# Patient Record
Sex: Male | Born: 1940
Health system: Southern US, Community
[De-identification: ages and names within clinical notes are randomized; demographics above are authoritative.]

## PROBLEM LIST (undated history)

## (undated) DIAGNOSIS — G809 Cerebral palsy, unspecified: Secondary | ICD-10-CM

## (undated) DIAGNOSIS — M6250 Muscle wasting and atrophy, not elsewhere classified, unspecified site: Secondary | ICD-10-CM

## (undated) DIAGNOSIS — Z993 Dependence on wheelchair: Secondary | ICD-10-CM

## (undated) DIAGNOSIS — H5010 Unspecified exotropia: Secondary | ICD-10-CM

## (undated) DIAGNOSIS — Z87442 Personal history of urinary calculi: Secondary | ICD-10-CM

## (undated) DIAGNOSIS — F419 Anxiety disorder, unspecified: Secondary | ICD-10-CM

## (undated) DIAGNOSIS — D72819 Decreased white blood cell count, unspecified: Secondary | ICD-10-CM

## (undated) DIAGNOSIS — R3915 Urgency of urination: Secondary | ICD-10-CM

## (undated) DIAGNOSIS — N200 Calculus of kidney: Secondary | ICD-10-CM

## (undated) DIAGNOSIS — R32 Unspecified urinary incontinence: Secondary | ICD-10-CM

## (undated) DIAGNOSIS — E559 Vitamin D deficiency, unspecified: Secondary | ICD-10-CM

## (undated) DIAGNOSIS — R600 Localized edema: Secondary | ICD-10-CM

## (undated) DIAGNOSIS — N390 Urinary tract infection, site not specified: Secondary | ICD-10-CM

## (undated) DIAGNOSIS — I872 Venous insufficiency (chronic) (peripheral): Secondary | ICD-10-CM

## (undated) DIAGNOSIS — R350 Frequency of micturition: Secondary | ICD-10-CM

## (undated) DIAGNOSIS — R399 Unspecified symptoms and signs involving the genitourinary system: Secondary | ICD-10-CM

## (undated) DIAGNOSIS — E785 Hyperlipidemia, unspecified: Secondary | ICD-10-CM

## (undated) DIAGNOSIS — F79 Unspecified intellectual disabilities: Secondary | ICD-10-CM

## (undated) DIAGNOSIS — L309 Dermatitis, unspecified: Secondary | ICD-10-CM

## (undated) DIAGNOSIS — N400 Enlarged prostate without lower urinary tract symptoms: Secondary | ICD-10-CM

## (undated) DIAGNOSIS — E663 Overweight: Secondary | ICD-10-CM

## (undated) DIAGNOSIS — R972 Elevated prostate specific antigen [PSA]: Secondary | ICD-10-CM

## (undated) DIAGNOSIS — I890 Lymphedema, not elsewhere classified: Secondary | ICD-10-CM

## (undated) DIAGNOSIS — C61 Malignant neoplasm of prostate: Secondary | ICD-10-CM

## (undated) DIAGNOSIS — R310 Gross hematuria: Secondary | ICD-10-CM

## (undated) HISTORY — DX: Dermatitis, unspecified: L30.9

## (undated) HISTORY — DX: Unspecified intellectual disabilities: F79

## (undated) HISTORY — DX: Cerebral palsy, unspecified: G80.9

## (undated) HISTORY — DX: Urgency of urination: R39.15

## (undated) HISTORY — PX: OTHER SURGICAL HISTORY: SHX169

## (undated) HISTORY — DX: Dependence on wheelchair: Z99.3

## (undated) HISTORY — DX: Malignant neoplasm of prostate: C61

## (undated) HISTORY — DX: Elevated prostate specific antigen (PSA): R97.20

## (undated) HISTORY — DX: Unspecified exotropia: H50.10

## (undated) HISTORY — DX: Benign prostatic hyperplasia without lower urinary tract symptoms: N40.0

## (undated) HISTORY — DX: Hyperlipidemia, unspecified: E78.5

## (undated) HISTORY — DX: Unspecified symptoms and signs involving the genitourinary system: R39.9

## (undated) HISTORY — DX: Overweight: E66.3

## (undated) HISTORY — DX: Vitamin D deficiency, unspecified: E55.9

## (undated) HISTORY — DX: Gross hematuria: R31.0

## (undated) HISTORY — DX: Muscle wasting and atrophy, not elsewhere classified, unspecified site: M62.50

## (undated) HISTORY — DX: Unspecified urinary incontinence: R32

## (undated) HISTORY — DX: Venous insufficiency (chronic) (peripheral): I87.2

## (undated) HISTORY — DX: Decreased white blood cell count, unspecified: D72.819

## (undated) HISTORY — DX: Lymphedema, not elsewhere classified: I89.0

## (undated) HISTORY — DX: Frequency of micturition: R35.0

---

## 2008-11-10 HISTORY — PX: PROSTATE SURGERY: SHX751

## 2011-05-02 DIAGNOSIS — J029 Acute pharyngitis, unspecified: Secondary | ICD-10-CM | POA: Diagnosis not present

## 2011-05-02 DIAGNOSIS — R0982 Postnasal drip: Secondary | ICD-10-CM | POA: Diagnosis not present

## 2011-08-10 DIAGNOSIS — E785 Hyperlipidemia, unspecified: Secondary | ICD-10-CM | POA: Diagnosis not present

## 2011-08-10 DIAGNOSIS — Z993 Dependence on wheelchair: Secondary | ICD-10-CM | POA: Diagnosis not present

## 2011-08-10 DIAGNOSIS — I10 Essential (primary) hypertension: Secondary | ICD-10-CM | POA: Diagnosis not present

## 2011-08-10 DIAGNOSIS — F71 Moderate intellectual disabilities: Secondary | ICD-10-CM | POA: Diagnosis not present

## 2011-08-14 DIAGNOSIS — E785 Hyperlipidemia, unspecified: Secondary | ICD-10-CM | POA: Diagnosis not present

## 2011-08-14 DIAGNOSIS — E559 Vitamin D deficiency, unspecified: Secondary | ICD-10-CM | POA: Diagnosis not present

## 2011-08-14 DIAGNOSIS — I10 Essential (primary) hypertension: Secondary | ICD-10-CM | POA: Diagnosis not present

## 2011-09-18 DIAGNOSIS — S1093XA Contusion of unspecified part of neck, initial encounter: Secondary | ICD-10-CM | POA: Diagnosis not present

## 2011-09-18 DIAGNOSIS — S0003XA Contusion of scalp, initial encounter: Secondary | ICD-10-CM | POA: Diagnosis not present

## 2011-10-09 DIAGNOSIS — C61 Malignant neoplasm of prostate: Secondary | ICD-10-CM | POA: Diagnosis not present

## 2011-12-20 DIAGNOSIS — F70 Mild intellectual disabilities: Secondary | ICD-10-CM | POA: Diagnosis not present

## 2012-02-02 DIAGNOSIS — Z9181 History of falling: Secondary | ICD-10-CM | POA: Diagnosis not present

## 2012-02-02 DIAGNOSIS — E669 Obesity, unspecified: Secondary | ICD-10-CM | POA: Diagnosis not present

## 2012-02-02 DIAGNOSIS — Z0000 Encounter for general adult medical examination without abnormal findings: Secondary | ICD-10-CM | POA: Diagnosis not present

## 2012-02-02 DIAGNOSIS — Z1331 Encounter for screening for depression: Secondary | ICD-10-CM | POA: Diagnosis not present

## 2012-03-06 DIAGNOSIS — Z23 Encounter for immunization: Secondary | ICD-10-CM | POA: Diagnosis not present

## 2012-03-25 DIAGNOSIS — W06XXXA Fall from bed, initial encounter: Secondary | ICD-10-CM | POA: Diagnosis not present

## 2012-03-25 DIAGNOSIS — Z9181 History of falling: Secondary | ICD-10-CM | POA: Diagnosis not present

## 2012-04-08 DIAGNOSIS — C61 Malignant neoplasm of prostate: Secondary | ICD-10-CM | POA: Diagnosis not present

## 2012-04-08 DIAGNOSIS — R350 Frequency of micturition: Secondary | ICD-10-CM | POA: Diagnosis not present

## 2012-08-19 DIAGNOSIS — Z993 Dependence on wheelchair: Secondary | ICD-10-CM | POA: Diagnosis not present

## 2012-08-19 DIAGNOSIS — I10 Essential (primary) hypertension: Secondary | ICD-10-CM | POA: Diagnosis not present

## 2012-08-19 DIAGNOSIS — E785 Hyperlipidemia, unspecified: Secondary | ICD-10-CM | POA: Diagnosis not present

## 2012-08-19 DIAGNOSIS — R634 Abnormal weight loss: Secondary | ICD-10-CM | POA: Diagnosis not present

## 2012-10-25 ENCOUNTER — Emergency Department: Payer: Self-pay | Admitting: Emergency Medicine

## 2012-10-25 DIAGNOSIS — Z043 Encounter for examination and observation following other accident: Secondary | ICD-10-CM | POA: Diagnosis not present

## 2012-10-25 DIAGNOSIS — I4949 Other premature depolarization: Secondary | ICD-10-CM | POA: Diagnosis not present

## 2012-10-25 DIAGNOSIS — R0602 Shortness of breath: Secondary | ICD-10-CM | POA: Diagnosis not present

## 2012-11-19 DIAGNOSIS — R634 Abnormal weight loss: Secondary | ICD-10-CM | POA: Diagnosis not present

## 2012-11-19 DIAGNOSIS — I10 Essential (primary) hypertension: Secondary | ICD-10-CM | POA: Diagnosis not present

## 2012-11-19 DIAGNOSIS — E785 Hyperlipidemia, unspecified: Secondary | ICD-10-CM | POA: Diagnosis not present

## 2012-11-19 DIAGNOSIS — F71 Moderate intellectual disabilities: Secondary | ICD-10-CM | POA: Diagnosis not present

## 2012-12-05 DIAGNOSIS — H2510 Age-related nuclear cataract, unspecified eye: Secondary | ICD-10-CM | POA: Diagnosis not present

## 2012-12-11 DIAGNOSIS — S20229A Contusion of unspecified back wall of thorax, initial encounter: Secondary | ICD-10-CM | POA: Diagnosis not present

## 2013-01-09 DIAGNOSIS — R05 Cough: Secondary | ICD-10-CM | POA: Diagnosis not present

## 2013-01-09 DIAGNOSIS — J069 Acute upper respiratory infection, unspecified: Secondary | ICD-10-CM | POA: Diagnosis not present

## 2013-01-09 DIAGNOSIS — R51 Headache: Secondary | ICD-10-CM | POA: Diagnosis not present

## 2013-02-04 DIAGNOSIS — Z0000 Encounter for general adult medical examination without abnormal findings: Secondary | ICD-10-CM | POA: Diagnosis not present

## 2013-02-04 DIAGNOSIS — Z9181 History of falling: Secondary | ICD-10-CM | POA: Diagnosis not present

## 2013-02-04 DIAGNOSIS — Z1331 Encounter for screening for depression: Secondary | ICD-10-CM | POA: Diagnosis not present

## 2013-02-04 DIAGNOSIS — Z23 Encounter for immunization: Secondary | ICD-10-CM | POA: Diagnosis not present

## 2013-05-02 DIAGNOSIS — C61 Malignant neoplasm of prostate: Secondary | ICD-10-CM | POA: Diagnosis not present

## 2013-05-25 ENCOUNTER — Emergency Department: Payer: Self-pay

## 2013-05-25 DIAGNOSIS — Z7982 Long term (current) use of aspirin: Secondary | ICD-10-CM | POA: Diagnosis not present

## 2013-05-25 DIAGNOSIS — R319 Hematuria, unspecified: Secondary | ICD-10-CM | POA: Diagnosis not present

## 2013-05-25 DIAGNOSIS — I10 Essential (primary) hypertension: Secondary | ICD-10-CM | POA: Diagnosis not present

## 2013-05-25 LAB — URINALYSIS, COMPLETE
BILIRUBIN, UR: NEGATIVE
Bacteria: NONE SEEN
Glucose,UR: NEGATIVE mg/dL (ref 0–75)
Ketone: NEGATIVE
Leukocyte Esterase: NEGATIVE
Nitrite: NEGATIVE
PH: 8 (ref 4.5–8.0)
Specific Gravity: 1.012 (ref 1.003–1.030)
Squamous Epithelial: NONE SEEN
WBC UR: 1 /HPF (ref 0–5)

## 2013-05-25 LAB — CBC
HCT: 40 % (ref 40.0–52.0)
HGB: 14.1 g/dL (ref 13.0–18.0)
MCH: 33.7 pg (ref 26.0–34.0)
MCHC: 35.3 g/dL (ref 32.0–36.0)
MCV: 95 fL (ref 80–100)
Platelet: 175 10*3/uL (ref 150–440)
RBC: 4.19 10*6/uL — ABNORMAL LOW (ref 4.40–5.90)
RDW: 12.3 % (ref 11.5–14.5)
WBC: 5.8 10*3/uL (ref 3.8–10.6)

## 2013-05-25 LAB — BASIC METABOLIC PANEL
Anion Gap: 1 — ABNORMAL LOW (ref 7–16)
BUN: 17 mg/dL (ref 7–18)
CO2: 31 mmol/L (ref 21–32)
CREATININE: 0.92 mg/dL (ref 0.60–1.30)
Calcium, Total: 9.3 mg/dL (ref 8.5–10.1)
Chloride: 108 mmol/L — ABNORMAL HIGH (ref 98–107)
EGFR (Non-African Amer.): >60
GLUCOSE: 109 mg/dL — AB (ref 65–99)
Osmolality: 282 (ref 275–301)
POTASSIUM: 4 mmol/L (ref 3.5–5.1)
Sodium: 140 mmol/L (ref 136–145)

## 2013-05-27 LAB — URINE CULTURE

## 2013-06-06 DIAGNOSIS — R310 Gross hematuria: Secondary | ICD-10-CM | POA: Diagnosis not present

## 2013-06-09 DIAGNOSIS — I10 Essential (primary) hypertension: Secondary | ICD-10-CM | POA: Diagnosis not present

## 2013-06-09 DIAGNOSIS — G809 Cerebral palsy, unspecified: Secondary | ICD-10-CM | POA: Diagnosis not present

## 2013-06-09 DIAGNOSIS — E785 Hyperlipidemia, unspecified: Secondary | ICD-10-CM | POA: Diagnosis not present

## 2013-06-09 DIAGNOSIS — C61 Malignant neoplasm of prostate: Secondary | ICD-10-CM | POA: Diagnosis not present

## 2013-06-23 DIAGNOSIS — E785 Hyperlipidemia, unspecified: Secondary | ICD-10-CM | POA: Diagnosis not present

## 2013-06-23 DIAGNOSIS — I10 Essential (primary) hypertension: Secondary | ICD-10-CM | POA: Diagnosis not present

## 2013-06-23 LAB — LIPID PANEL
CHOLESTEROL: 130 mg/dL (ref 0–200)
HDL: 50 mg/dL (ref 35–70)
LDL Cholesterol: 66 mg/dL
Triglycerides: 72 mg/dL (ref 40–160)

## 2013-06-24 ENCOUNTER — Ambulatory Visit: Payer: Self-pay | Admitting: Urology

## 2013-06-24 DIAGNOSIS — N200 Calculus of kidney: Secondary | ICD-10-CM | POA: Diagnosis not present

## 2013-06-24 DIAGNOSIS — R319 Hematuria, unspecified: Secondary | ICD-10-CM | POA: Diagnosis not present

## 2013-06-24 DIAGNOSIS — Q619 Cystic kidney disease, unspecified: Secondary | ICD-10-CM | POA: Diagnosis not present

## 2013-07-01 DIAGNOSIS — R310 Gross hematuria: Secondary | ICD-10-CM | POA: Diagnosis not present

## 2013-08-07 DIAGNOSIS — Z01818 Encounter for other preprocedural examination: Secondary | ICD-10-CM | POA: Diagnosis not present

## 2013-08-07 DIAGNOSIS — R310 Gross hematuria: Secondary | ICD-10-CM | POA: Diagnosis not present

## 2013-08-14 ENCOUNTER — Ambulatory Visit: Payer: Self-pay | Admitting: Urology

## 2013-08-14 DIAGNOSIS — G809 Cerebral palsy, unspecified: Secondary | ICD-10-CM | POA: Diagnosis not present

## 2013-08-14 DIAGNOSIS — Z01812 Encounter for preprocedural laboratory examination: Secondary | ICD-10-CM | POA: Diagnosis not present

## 2013-08-14 DIAGNOSIS — Z01818 Encounter for other preprocedural examination: Secondary | ICD-10-CM | POA: Diagnosis not present

## 2013-08-14 DIAGNOSIS — R310 Gross hematuria: Secondary | ICD-10-CM | POA: Diagnosis not present

## 2013-08-14 DIAGNOSIS — Z01810 Encounter for preprocedural cardiovascular examination: Secondary | ICD-10-CM | POA: Diagnosis not present

## 2013-08-14 DIAGNOSIS — I10 Essential (primary) hypertension: Secondary | ICD-10-CM | POA: Diagnosis not present

## 2013-08-14 LAB — CBC
HCT: 40.3 % (ref 40.0–52.0)
HGB: 14.1 g/dL (ref 13.0–18.0)
MCH: 33.6 pg (ref 26.0–34.0)
MCHC: 35.1 g/dL (ref 32.0–36.0)
MCV: 96 fL (ref 80–100)
Platelet: 143 10*3/uL — ABNORMAL LOW (ref 150–440)
RBC: 4.21 10*6/uL — ABNORMAL LOW (ref 4.40–5.90)
RDW: 12.9 % (ref 11.5–14.5)
WBC: 5.5 10*3/uL (ref 3.8–10.6)

## 2013-08-14 LAB — BASIC METABOLIC PANEL
ANION GAP: 3 — AB (ref 7–16)
BUN: 14 mg/dL (ref 7–18)
CREATININE: 0.71 mg/dL (ref 0.60–1.30)
Calcium, Total: 8.4 mg/dL — ABNORMAL LOW (ref 8.5–10.1)
Chloride: 108 mmol/L — ABNORMAL HIGH (ref 98–107)
Co2: 28 mmol/L (ref 21–32)
EGFR (African American): >60
Glucose: 99 mg/dL (ref 65–99)
OSMOLALITY: 278 (ref 275–301)
Potassium: 3.8 mmol/L (ref 3.5–5.1)
Sodium: 139 mmol/L (ref 136–145)

## 2013-08-22 ENCOUNTER — Ambulatory Visit: Payer: Self-pay | Admitting: Urology

## 2013-08-22 DIAGNOSIS — Z7982 Long term (current) use of aspirin: Secondary | ICD-10-CM | POA: Diagnosis not present

## 2013-08-22 DIAGNOSIS — Z8546 Personal history of malignant neoplasm of prostate: Secondary | ICD-10-CM | POA: Diagnosis not present

## 2013-08-22 DIAGNOSIS — Z791 Long term (current) use of non-steroidal anti-inflammatories (NSAID): Secondary | ICD-10-CM | POA: Diagnosis not present

## 2013-08-22 DIAGNOSIS — Z8249 Family history of ischemic heart disease and other diseases of the circulatory system: Secondary | ICD-10-CM | POA: Diagnosis not present

## 2013-08-22 DIAGNOSIS — I10 Essential (primary) hypertension: Secondary | ICD-10-CM | POA: Diagnosis not present

## 2013-08-22 DIAGNOSIS — R319 Hematuria, unspecified: Secondary | ICD-10-CM | POA: Diagnosis not present

## 2013-08-22 DIAGNOSIS — R310 Gross hematuria: Secondary | ICD-10-CM | POA: Diagnosis not present

## 2013-08-22 DIAGNOSIS — Z993 Dependence on wheelchair: Secondary | ICD-10-CM | POA: Diagnosis not present

## 2013-08-22 DIAGNOSIS — Z79899 Other long term (current) drug therapy: Secondary | ICD-10-CM | POA: Diagnosis not present

## 2013-08-22 DIAGNOSIS — N400 Enlarged prostate without lower urinary tract symptoms: Secondary | ICD-10-CM | POA: Diagnosis not present

## 2013-08-22 DIAGNOSIS — G809 Cerebral palsy, unspecified: Secondary | ICD-10-CM | POA: Diagnosis not present

## 2013-09-25 DIAGNOSIS — M6250 Muscle wasting and atrophy, not elsewhere classified, unspecified site: Secondary | ICD-10-CM | POA: Diagnosis not present

## 2013-09-25 DIAGNOSIS — Z1331 Encounter for screening for depression: Secondary | ICD-10-CM | POA: Diagnosis not present

## 2013-09-25 DIAGNOSIS — R5381 Other malaise: Secondary | ICD-10-CM | POA: Diagnosis not present

## 2013-09-25 DIAGNOSIS — R5383 Other fatigue: Secondary | ICD-10-CM | POA: Diagnosis not present

## 2013-09-25 DIAGNOSIS — L608 Other nail disorders: Secondary | ICD-10-CM | POA: Diagnosis not present

## 2013-10-02 DIAGNOSIS — F71 Moderate intellectual disabilities: Secondary | ICD-10-CM | POA: Diagnosis not present

## 2013-10-02 DIAGNOSIS — G809 Cerebral palsy, unspecified: Secondary | ICD-10-CM | POA: Diagnosis not present

## 2013-10-02 DIAGNOSIS — IMO0001 Reserved for inherently not codable concepts without codable children: Secondary | ICD-10-CM | POA: Diagnosis not present

## 2013-10-02 DIAGNOSIS — I10 Essential (primary) hypertension: Secondary | ICD-10-CM | POA: Diagnosis not present

## 2013-10-02 DIAGNOSIS — M6250 Muscle wasting and atrophy, not elsewhere classified, unspecified site: Secondary | ICD-10-CM | POA: Diagnosis not present

## 2013-10-02 DIAGNOSIS — Z8546 Personal history of malignant neoplasm of prostate: Secondary | ICD-10-CM | POA: Diagnosis not present

## 2013-10-02 DIAGNOSIS — L608 Other nail disorders: Secondary | ICD-10-CM | POA: Diagnosis not present

## 2013-10-02 DIAGNOSIS — Z7982 Long term (current) use of aspirin: Secondary | ICD-10-CM | POA: Diagnosis not present

## 2013-10-02 DIAGNOSIS — Z8612 Personal history of poliomyelitis: Secondary | ICD-10-CM | POA: Diagnosis not present

## 2013-10-02 DIAGNOSIS — Z993 Dependence on wheelchair: Secondary | ICD-10-CM | POA: Diagnosis not present

## 2013-10-02 DIAGNOSIS — Z9181 History of falling: Secondary | ICD-10-CM | POA: Diagnosis not present

## 2013-10-06 DIAGNOSIS — M6250 Muscle wasting and atrophy, not elsewhere classified, unspecified site: Secondary | ICD-10-CM | POA: Diagnosis not present

## 2013-10-06 DIAGNOSIS — G809 Cerebral palsy, unspecified: Secondary | ICD-10-CM | POA: Diagnosis not present

## 2013-10-06 DIAGNOSIS — IMO0001 Reserved for inherently not codable concepts without codable children: Secondary | ICD-10-CM | POA: Diagnosis not present

## 2013-10-06 DIAGNOSIS — I10 Essential (primary) hypertension: Secondary | ICD-10-CM | POA: Diagnosis not present

## 2013-10-06 DIAGNOSIS — L608 Other nail disorders: Secondary | ICD-10-CM | POA: Diagnosis not present

## 2013-10-06 DIAGNOSIS — F71 Moderate intellectual disabilities: Secondary | ICD-10-CM | POA: Diagnosis not present

## 2013-10-08 DIAGNOSIS — I10 Essential (primary) hypertension: Secondary | ICD-10-CM | POA: Diagnosis not present

## 2013-10-08 DIAGNOSIS — G809 Cerebral palsy, unspecified: Secondary | ICD-10-CM | POA: Diagnosis not present

## 2013-10-08 DIAGNOSIS — L608 Other nail disorders: Secondary | ICD-10-CM | POA: Diagnosis not present

## 2013-10-08 DIAGNOSIS — M6250 Muscle wasting and atrophy, not elsewhere classified, unspecified site: Secondary | ICD-10-CM | POA: Diagnosis not present

## 2013-10-08 DIAGNOSIS — IMO0001 Reserved for inherently not codable concepts without codable children: Secondary | ICD-10-CM | POA: Diagnosis not present

## 2013-10-08 DIAGNOSIS — F71 Moderate intellectual disabilities: Secondary | ICD-10-CM | POA: Diagnosis not present

## 2013-10-09 DIAGNOSIS — M6250 Muscle wasting and atrophy, not elsewhere classified, unspecified site: Secondary | ICD-10-CM | POA: Diagnosis not present

## 2013-10-09 DIAGNOSIS — Z993 Dependence on wheelchair: Secondary | ICD-10-CM | POA: Diagnosis not present

## 2013-10-09 DIAGNOSIS — I10 Essential (primary) hypertension: Secondary | ICD-10-CM | POA: Diagnosis not present

## 2013-10-09 DIAGNOSIS — E785 Hyperlipidemia, unspecified: Secondary | ICD-10-CM | POA: Diagnosis not present

## 2013-10-17 DIAGNOSIS — M6250 Muscle wasting and atrophy, not elsewhere classified, unspecified site: Secondary | ICD-10-CM | POA: Diagnosis not present

## 2013-10-17 DIAGNOSIS — G809 Cerebral palsy, unspecified: Secondary | ICD-10-CM | POA: Diagnosis not present

## 2013-10-17 DIAGNOSIS — I10 Essential (primary) hypertension: Secondary | ICD-10-CM | POA: Diagnosis not present

## 2013-10-17 DIAGNOSIS — F71 Moderate intellectual disabilities: Secondary | ICD-10-CM | POA: Diagnosis not present

## 2013-10-17 DIAGNOSIS — IMO0001 Reserved for inherently not codable concepts without codable children: Secondary | ICD-10-CM | POA: Diagnosis not present

## 2013-10-17 DIAGNOSIS — L608 Other nail disorders: Secondary | ICD-10-CM | POA: Diagnosis not present

## 2013-10-21 DIAGNOSIS — IMO0001 Reserved for inherently not codable concepts without codable children: Secondary | ICD-10-CM | POA: Diagnosis not present

## 2013-10-21 DIAGNOSIS — F71 Moderate intellectual disabilities: Secondary | ICD-10-CM | POA: Diagnosis not present

## 2013-10-21 DIAGNOSIS — M6250 Muscle wasting and atrophy, not elsewhere classified, unspecified site: Secondary | ICD-10-CM | POA: Diagnosis not present

## 2013-10-21 DIAGNOSIS — G809 Cerebral palsy, unspecified: Secondary | ICD-10-CM | POA: Diagnosis not present

## 2013-10-21 DIAGNOSIS — I10 Essential (primary) hypertension: Secondary | ICD-10-CM | POA: Diagnosis not present

## 2013-10-21 DIAGNOSIS — L608 Other nail disorders: Secondary | ICD-10-CM | POA: Diagnosis not present

## 2013-10-30 DIAGNOSIS — C61 Malignant neoplasm of prostate: Secondary | ICD-10-CM | POA: Diagnosis not present

## 2013-12-03 DIAGNOSIS — W06XXXA Fall from bed, initial encounter: Secondary | ICD-10-CM | POA: Diagnosis not present

## 2013-12-03 DIAGNOSIS — E785 Hyperlipidemia, unspecified: Secondary | ICD-10-CM | POA: Diagnosis not present

## 2013-12-03 DIAGNOSIS — Z993 Dependence on wheelchair: Secondary | ICD-10-CM | POA: Diagnosis not present

## 2013-12-03 DIAGNOSIS — Z9181 History of falling: Secondary | ICD-10-CM | POA: Diagnosis not present

## 2013-12-06 DIAGNOSIS — M169 Osteoarthritis of hip, unspecified: Secondary | ICD-10-CM | POA: Diagnosis not present

## 2013-12-06 DIAGNOSIS — G809 Cerebral palsy, unspecified: Secondary | ICD-10-CM | POA: Diagnosis not present

## 2013-12-06 DIAGNOSIS — M899 Disorder of bone, unspecified: Secondary | ICD-10-CM | POA: Diagnosis not present

## 2013-12-06 DIAGNOSIS — R262 Difficulty in walking, not elsewhere classified: Secondary | ICD-10-CM | POA: Diagnosis not present

## 2013-12-06 DIAGNOSIS — M6281 Muscle weakness (generalized): Secondary | ICD-10-CM | POA: Diagnosis not present

## 2013-12-06 DIAGNOSIS — M949 Disorder of cartilage, unspecified: Secondary | ICD-10-CM | POA: Diagnosis not present

## 2013-12-06 DIAGNOSIS — M1610 Unilateral primary osteoarthritis, unspecified hip: Secondary | ICD-10-CM | POA: Diagnosis not present

## 2013-12-06 DIAGNOSIS — M25559 Pain in unspecified hip: Secondary | ICD-10-CM | POA: Diagnosis not present

## 2013-12-08 DIAGNOSIS — G809 Cerebral palsy, unspecified: Secondary | ICD-10-CM | POA: Diagnosis not present

## 2013-12-08 DIAGNOSIS — M6281 Muscle weakness (generalized): Secondary | ICD-10-CM | POA: Diagnosis not present

## 2013-12-08 DIAGNOSIS — R262 Difficulty in walking, not elsewhere classified: Secondary | ICD-10-CM | POA: Diagnosis not present

## 2013-12-15 DIAGNOSIS — R262 Difficulty in walking, not elsewhere classified: Secondary | ICD-10-CM | POA: Diagnosis not present

## 2013-12-15 DIAGNOSIS — G809 Cerebral palsy, unspecified: Secondary | ICD-10-CM | POA: Diagnosis not present

## 2013-12-15 DIAGNOSIS — M6281 Muscle weakness (generalized): Secondary | ICD-10-CM | POA: Diagnosis not present

## 2013-12-20 DIAGNOSIS — R262 Difficulty in walking, not elsewhere classified: Secondary | ICD-10-CM | POA: Diagnosis not present

## 2013-12-20 DIAGNOSIS — M6281 Muscle weakness (generalized): Secondary | ICD-10-CM | POA: Diagnosis not present

## 2013-12-20 DIAGNOSIS — G809 Cerebral palsy, unspecified: Secondary | ICD-10-CM | POA: Diagnosis not present

## 2013-12-22 DIAGNOSIS — R262 Difficulty in walking, not elsewhere classified: Secondary | ICD-10-CM | POA: Diagnosis not present

## 2013-12-22 DIAGNOSIS — M6281 Muscle weakness (generalized): Secondary | ICD-10-CM | POA: Diagnosis not present

## 2013-12-22 DIAGNOSIS — H2510 Age-related nuclear cataract, unspecified eye: Secondary | ICD-10-CM | POA: Diagnosis not present

## 2013-12-22 DIAGNOSIS — G809 Cerebral palsy, unspecified: Secondary | ICD-10-CM | POA: Diagnosis not present

## 2013-12-26 DIAGNOSIS — R262 Difficulty in walking, not elsewhere classified: Secondary | ICD-10-CM | POA: Diagnosis not present

## 2013-12-26 DIAGNOSIS — G809 Cerebral palsy, unspecified: Secondary | ICD-10-CM | POA: Diagnosis not present

## 2013-12-26 DIAGNOSIS — M6281 Muscle weakness (generalized): Secondary | ICD-10-CM | POA: Diagnosis not present

## 2013-12-29 DIAGNOSIS — G809 Cerebral palsy, unspecified: Secondary | ICD-10-CM | POA: Diagnosis not present

## 2013-12-29 DIAGNOSIS — M6281 Muscle weakness (generalized): Secondary | ICD-10-CM | POA: Diagnosis not present

## 2013-12-29 DIAGNOSIS — R262 Difficulty in walking, not elsewhere classified: Secondary | ICD-10-CM | POA: Diagnosis not present

## 2013-12-31 DIAGNOSIS — L608 Other nail disorders: Secondary | ICD-10-CM | POA: Diagnosis not present

## 2013-12-31 DIAGNOSIS — Z23 Encounter for immunization: Secondary | ICD-10-CM | POA: Diagnosis not present

## 2013-12-31 DIAGNOSIS — R609 Edema, unspecified: Secondary | ICD-10-CM | POA: Diagnosis not present

## 2014-01-05 DIAGNOSIS — M6281 Muscle weakness (generalized): Secondary | ICD-10-CM | POA: Diagnosis not present

## 2014-01-05 DIAGNOSIS — G809 Cerebral palsy, unspecified: Secondary | ICD-10-CM | POA: Diagnosis not present

## 2014-01-05 DIAGNOSIS — R262 Difficulty in walking, not elsewhere classified: Secondary | ICD-10-CM | POA: Diagnosis not present

## 2014-01-10 DIAGNOSIS — G809 Cerebral palsy, unspecified: Secondary | ICD-10-CM | POA: Diagnosis not present

## 2014-01-10 DIAGNOSIS — M6281 Muscle weakness (generalized): Secondary | ICD-10-CM | POA: Diagnosis not present

## 2014-01-10 DIAGNOSIS — R262 Difficulty in walking, not elsewhere classified: Secondary | ICD-10-CM | POA: Diagnosis not present

## 2014-01-12 DIAGNOSIS — R262 Difficulty in walking, not elsewhere classified: Secondary | ICD-10-CM | POA: Diagnosis not present

## 2014-01-12 DIAGNOSIS — M6281 Muscle weakness (generalized): Secondary | ICD-10-CM | POA: Diagnosis not present

## 2014-01-12 DIAGNOSIS — G809 Cerebral palsy, unspecified: Secondary | ICD-10-CM | POA: Diagnosis not present

## 2014-01-17 DIAGNOSIS — M6281 Muscle weakness (generalized): Secondary | ICD-10-CM | POA: Diagnosis not present

## 2014-01-17 DIAGNOSIS — R262 Difficulty in walking, not elsewhere classified: Secondary | ICD-10-CM | POA: Diagnosis not present

## 2014-01-17 DIAGNOSIS — G809 Cerebral palsy, unspecified: Secondary | ICD-10-CM | POA: Diagnosis not present

## 2014-01-19 DIAGNOSIS — G809 Cerebral palsy, unspecified: Secondary | ICD-10-CM | POA: Diagnosis not present

## 2014-01-19 DIAGNOSIS — R262 Difficulty in walking, not elsewhere classified: Secondary | ICD-10-CM | POA: Diagnosis not present

## 2014-01-19 DIAGNOSIS — M6281 Muscle weakness (generalized): Secondary | ICD-10-CM | POA: Diagnosis not present

## 2014-01-22 DIAGNOSIS — I872 Venous insufficiency (chronic) (peripheral): Secondary | ICD-10-CM | POA: Diagnosis not present

## 2014-01-22 DIAGNOSIS — I890 Lymphedema, not elsewhere classified: Secondary | ICD-10-CM | POA: Diagnosis not present

## 2014-01-22 DIAGNOSIS — M7989 Other specified soft tissue disorders: Secondary | ICD-10-CM | POA: Diagnosis not present

## 2014-01-26 DIAGNOSIS — M6281 Muscle weakness (generalized): Secondary | ICD-10-CM | POA: Diagnosis not present

## 2014-01-26 DIAGNOSIS — R262 Difficulty in walking, not elsewhere classified: Secondary | ICD-10-CM | POA: Diagnosis not present

## 2014-01-26 DIAGNOSIS — G809 Cerebral palsy, unspecified: Secondary | ICD-10-CM | POA: Diagnosis not present

## 2014-02-02 DIAGNOSIS — G809 Cerebral palsy, unspecified: Secondary | ICD-10-CM | POA: Diagnosis not present

## 2014-02-02 DIAGNOSIS — M6281 Muscle weakness (generalized): Secondary | ICD-10-CM | POA: Diagnosis not present

## 2014-02-02 DIAGNOSIS — R262 Difficulty in walking, not elsewhere classified: Secondary | ICD-10-CM | POA: Diagnosis not present

## 2014-02-09 DIAGNOSIS — Z1389 Encounter for screening for other disorder: Secondary | ICD-10-CM | POA: Diagnosis not present

## 2014-02-09 DIAGNOSIS — Z0000 Encounter for general adult medical examination without abnormal findings: Secondary | ICD-10-CM | POA: Diagnosis not present

## 2014-02-09 DIAGNOSIS — M6281 Muscle weakness (generalized): Secondary | ICD-10-CM | POA: Diagnosis not present

## 2014-02-09 DIAGNOSIS — R3981 Functional urinary incontinence: Secondary | ICD-10-CM | POA: Diagnosis not present

## 2014-02-09 DIAGNOSIS — R262 Difficulty in walking, not elsewhere classified: Secondary | ICD-10-CM | POA: Diagnosis not present

## 2014-02-09 DIAGNOSIS — Z125 Encounter for screening for malignant neoplasm of prostate: Secondary | ICD-10-CM | POA: Diagnosis not present

## 2014-02-09 DIAGNOSIS — G809 Cerebral palsy, unspecified: Secondary | ICD-10-CM | POA: Diagnosis not present

## 2014-02-09 DIAGNOSIS — F71 Moderate intellectual disabilities: Secondary | ICD-10-CM | POA: Diagnosis not present

## 2014-02-09 DIAGNOSIS — Z9181 History of falling: Secondary | ICD-10-CM | POA: Diagnosis not present

## 2014-02-12 DIAGNOSIS — R262 Difficulty in walking, not elsewhere classified: Secondary | ICD-10-CM | POA: Diagnosis not present

## 2014-02-12 DIAGNOSIS — G809 Cerebral palsy, unspecified: Secondary | ICD-10-CM | POA: Diagnosis not present

## 2014-02-12 DIAGNOSIS — M6281 Muscle weakness (generalized): Secondary | ICD-10-CM | POA: Diagnosis not present

## 2014-02-16 DIAGNOSIS — G809 Cerebral palsy, unspecified: Secondary | ICD-10-CM | POA: Diagnosis not present

## 2014-02-16 DIAGNOSIS — M6281 Muscle weakness (generalized): Secondary | ICD-10-CM | POA: Diagnosis not present

## 2014-02-16 DIAGNOSIS — R262 Difficulty in walking, not elsewhere classified: Secondary | ICD-10-CM | POA: Diagnosis not present

## 2014-02-28 DIAGNOSIS — M6281 Muscle weakness (generalized): Secondary | ICD-10-CM | POA: Diagnosis not present

## 2014-02-28 DIAGNOSIS — G809 Cerebral palsy, unspecified: Secondary | ICD-10-CM | POA: Diagnosis not present

## 2014-02-28 DIAGNOSIS — R262 Difficulty in walking, not elsewhere classified: Secondary | ICD-10-CM | POA: Diagnosis not present

## 2014-03-02 DIAGNOSIS — G809 Cerebral palsy, unspecified: Secondary | ICD-10-CM | POA: Diagnosis not present

## 2014-03-02 DIAGNOSIS — R262 Difficulty in walking, not elsewhere classified: Secondary | ICD-10-CM | POA: Diagnosis not present

## 2014-03-02 DIAGNOSIS — M6281 Muscle weakness (generalized): Secondary | ICD-10-CM | POA: Diagnosis not present

## 2014-03-09 DIAGNOSIS — G809 Cerebral palsy, unspecified: Secondary | ICD-10-CM | POA: Diagnosis not present

## 2014-03-09 DIAGNOSIS — R262 Difficulty in walking, not elsewhere classified: Secondary | ICD-10-CM | POA: Diagnosis not present

## 2014-03-09 DIAGNOSIS — M6281 Muscle weakness (generalized): Secondary | ICD-10-CM | POA: Diagnosis not present

## 2014-03-16 DIAGNOSIS — G809 Cerebral palsy, unspecified: Secondary | ICD-10-CM | POA: Diagnosis not present

## 2014-03-16 DIAGNOSIS — M6281 Muscle weakness (generalized): Secondary | ICD-10-CM | POA: Diagnosis not present

## 2014-03-16 DIAGNOSIS — R262 Difficulty in walking, not elsewhere classified: Secondary | ICD-10-CM | POA: Diagnosis not present

## 2014-03-24 DIAGNOSIS — R262 Difficulty in walking, not elsewhere classified: Secondary | ICD-10-CM | POA: Diagnosis not present

## 2014-03-24 DIAGNOSIS — G809 Cerebral palsy, unspecified: Secondary | ICD-10-CM | POA: Diagnosis not present

## 2014-03-24 DIAGNOSIS — M6281 Muscle weakness (generalized): Secondary | ICD-10-CM | POA: Diagnosis not present

## 2014-04-07 DIAGNOSIS — G809 Cerebral palsy, unspecified: Secondary | ICD-10-CM | POA: Diagnosis not present

## 2014-04-07 DIAGNOSIS — R262 Difficulty in walking, not elsewhere classified: Secondary | ICD-10-CM | POA: Diagnosis not present

## 2014-04-07 DIAGNOSIS — M6281 Muscle weakness (generalized): Secondary | ICD-10-CM | POA: Diagnosis not present

## 2014-04-14 DIAGNOSIS — M6281 Muscle weakness (generalized): Secondary | ICD-10-CM | POA: Diagnosis not present

## 2014-04-14 DIAGNOSIS — R262 Difficulty in walking, not elsewhere classified: Secondary | ICD-10-CM | POA: Diagnosis not present

## 2014-04-14 DIAGNOSIS — G809 Cerebral palsy, unspecified: Secondary | ICD-10-CM | POA: Diagnosis not present

## 2014-04-21 DIAGNOSIS — M6281 Muscle weakness (generalized): Secondary | ICD-10-CM | POA: Diagnosis not present

## 2014-04-21 DIAGNOSIS — R262 Difficulty in walking, not elsewhere classified: Secondary | ICD-10-CM | POA: Diagnosis not present

## 2014-04-21 DIAGNOSIS — G809 Cerebral palsy, unspecified: Secondary | ICD-10-CM | POA: Diagnosis not present

## 2014-04-27 DIAGNOSIS — M79609 Pain in unspecified limb: Secondary | ICD-10-CM | POA: Diagnosis not present

## 2014-04-27 DIAGNOSIS — I890 Lymphedema, not elsewhere classified: Secondary | ICD-10-CM | POA: Diagnosis not present

## 2014-04-27 DIAGNOSIS — M1990 Unspecified osteoarthritis, unspecified site: Secondary | ICD-10-CM | POA: Diagnosis not present

## 2014-04-27 DIAGNOSIS — M7989 Other specified soft tissue disorders: Secondary | ICD-10-CM | POA: Diagnosis not present

## 2014-04-27 DIAGNOSIS — I872 Venous insufficiency (chronic) (peripheral): Secondary | ICD-10-CM | POA: Diagnosis not present

## 2014-04-28 DIAGNOSIS — R262 Difficulty in walking, not elsewhere classified: Secondary | ICD-10-CM | POA: Diagnosis not present

## 2014-04-28 DIAGNOSIS — G809 Cerebral palsy, unspecified: Secondary | ICD-10-CM | POA: Diagnosis not present

## 2014-04-28 DIAGNOSIS — M6281 Muscle weakness (generalized): Secondary | ICD-10-CM | POA: Diagnosis not present

## 2014-05-05 DIAGNOSIS — R262 Difficulty in walking, not elsewhere classified: Secondary | ICD-10-CM | POA: Diagnosis not present

## 2014-05-05 DIAGNOSIS — G809 Cerebral palsy, unspecified: Secondary | ICD-10-CM | POA: Diagnosis not present

## 2014-05-05 DIAGNOSIS — M6281 Muscle weakness (generalized): Secondary | ICD-10-CM | POA: Diagnosis not present

## 2014-05-08 DIAGNOSIS — R350 Frequency of micturition: Secondary | ICD-10-CM | POA: Diagnosis not present

## 2014-05-08 DIAGNOSIS — E663 Overweight: Secondary | ICD-10-CM | POA: Diagnosis not present

## 2014-05-08 DIAGNOSIS — C61 Malignant neoplasm of prostate: Secondary | ICD-10-CM | POA: Diagnosis not present

## 2014-05-12 DIAGNOSIS — G809 Cerebral palsy, unspecified: Secondary | ICD-10-CM | POA: Diagnosis not present

## 2014-05-12 DIAGNOSIS — R262 Difficulty in walking, not elsewhere classified: Secondary | ICD-10-CM | POA: Diagnosis not present

## 2014-05-12 DIAGNOSIS — M6281 Muscle weakness (generalized): Secondary | ICD-10-CM | POA: Diagnosis not present

## 2014-05-18 DIAGNOSIS — G809 Cerebral palsy, unspecified: Secondary | ICD-10-CM | POA: Diagnosis not present

## 2014-05-18 DIAGNOSIS — R262 Difficulty in walking, not elsewhere classified: Secondary | ICD-10-CM | POA: Diagnosis not present

## 2014-05-18 DIAGNOSIS — M6281 Muscle weakness (generalized): Secondary | ICD-10-CM | POA: Diagnosis not present

## 2014-05-25 DIAGNOSIS — G809 Cerebral palsy, unspecified: Secondary | ICD-10-CM | POA: Diagnosis not present

## 2014-05-25 DIAGNOSIS — R262 Difficulty in walking, not elsewhere classified: Secondary | ICD-10-CM | POA: Diagnosis not present

## 2014-05-25 DIAGNOSIS — M6281 Muscle weakness (generalized): Secondary | ICD-10-CM | POA: Diagnosis not present

## 2014-06-01 DIAGNOSIS — R262 Difficulty in walking, not elsewhere classified: Secondary | ICD-10-CM | POA: Diagnosis not present

## 2014-06-01 DIAGNOSIS — M6281 Muscle weakness (generalized): Secondary | ICD-10-CM | POA: Diagnosis not present

## 2014-06-01 DIAGNOSIS — G809 Cerebral palsy, unspecified: Secondary | ICD-10-CM | POA: Diagnosis not present

## 2014-06-08 DIAGNOSIS — R262 Difficulty in walking, not elsewhere classified: Secondary | ICD-10-CM | POA: Diagnosis not present

## 2014-06-08 DIAGNOSIS — G809 Cerebral palsy, unspecified: Secondary | ICD-10-CM | POA: Diagnosis not present

## 2014-06-08 DIAGNOSIS — M6281 Muscle weakness (generalized): Secondary | ICD-10-CM | POA: Diagnosis not present

## 2014-06-15 DIAGNOSIS — M6281 Muscle weakness (generalized): Secondary | ICD-10-CM | POA: Diagnosis not present

## 2014-06-15 DIAGNOSIS — G809 Cerebral palsy, unspecified: Secondary | ICD-10-CM | POA: Diagnosis not present

## 2014-06-15 DIAGNOSIS — R262 Difficulty in walking, not elsewhere classified: Secondary | ICD-10-CM | POA: Diagnosis not present

## 2014-06-22 DIAGNOSIS — G809 Cerebral palsy, unspecified: Secondary | ICD-10-CM | POA: Diagnosis not present

## 2014-06-22 DIAGNOSIS — R634 Abnormal weight loss: Secondary | ICD-10-CM | POA: Diagnosis not present

## 2014-06-22 DIAGNOSIS — Z23 Encounter for immunization: Secondary | ICD-10-CM | POA: Diagnosis not present

## 2014-06-22 DIAGNOSIS — M62569 Muscle wasting and atrophy, not elsewhere classified, unspecified lower leg: Secondary | ICD-10-CM | POA: Diagnosis not present

## 2014-06-22 DIAGNOSIS — R262 Difficulty in walking, not elsewhere classified: Secondary | ICD-10-CM | POA: Diagnosis not present

## 2014-06-22 DIAGNOSIS — Z8612 Personal history of poliomyelitis: Secondary | ICD-10-CM | POA: Diagnosis not present

## 2014-06-22 DIAGNOSIS — E785 Hyperlipidemia, unspecified: Secondary | ICD-10-CM | POA: Diagnosis not present

## 2014-06-22 DIAGNOSIS — M6281 Muscle weakness (generalized): Secondary | ICD-10-CM | POA: Diagnosis not present

## 2014-06-22 DIAGNOSIS — R600 Localized edema: Secondary | ICD-10-CM | POA: Diagnosis not present

## 2014-06-22 DIAGNOSIS — Z993 Dependence on wheelchair: Secondary | ICD-10-CM | POA: Diagnosis not present

## 2014-07-08 DIAGNOSIS — G809 Cerebral palsy, unspecified: Secondary | ICD-10-CM | POA: Diagnosis not present

## 2014-07-08 DIAGNOSIS — M6281 Muscle weakness (generalized): Secondary | ICD-10-CM | POA: Diagnosis not present

## 2014-07-08 DIAGNOSIS — R262 Difficulty in walking, not elsewhere classified: Secondary | ICD-10-CM | POA: Diagnosis not present

## 2014-07-13 DIAGNOSIS — R262 Difficulty in walking, not elsewhere classified: Secondary | ICD-10-CM | POA: Diagnosis not present

## 2014-07-13 DIAGNOSIS — M6281 Muscle weakness (generalized): Secondary | ICD-10-CM | POA: Diagnosis not present

## 2014-07-13 DIAGNOSIS — G809 Cerebral palsy, unspecified: Secondary | ICD-10-CM | POA: Diagnosis not present

## 2014-07-20 DIAGNOSIS — M6281 Muscle weakness (generalized): Secondary | ICD-10-CM | POA: Diagnosis not present

## 2014-07-20 DIAGNOSIS — R262 Difficulty in walking, not elsewhere classified: Secondary | ICD-10-CM | POA: Diagnosis not present

## 2014-07-20 DIAGNOSIS — G809 Cerebral palsy, unspecified: Secondary | ICD-10-CM | POA: Diagnosis not present

## 2014-07-27 DIAGNOSIS — R262 Difficulty in walking, not elsewhere classified: Secondary | ICD-10-CM | POA: Diagnosis not present

## 2014-07-27 DIAGNOSIS — G809 Cerebral palsy, unspecified: Secondary | ICD-10-CM | POA: Diagnosis not present

## 2014-07-27 DIAGNOSIS — M6281 Muscle weakness (generalized): Secondary | ICD-10-CM | POA: Diagnosis not present

## 2014-08-03 DIAGNOSIS — R262 Difficulty in walking, not elsewhere classified: Secondary | ICD-10-CM | POA: Diagnosis not present

## 2014-08-03 DIAGNOSIS — M6281 Muscle weakness (generalized): Secondary | ICD-10-CM | POA: Diagnosis not present

## 2014-08-03 DIAGNOSIS — G809 Cerebral palsy, unspecified: Secondary | ICD-10-CM | POA: Diagnosis not present

## 2014-08-10 DIAGNOSIS — M6281 Muscle weakness (generalized): Secondary | ICD-10-CM | POA: Diagnosis not present

## 2014-08-10 DIAGNOSIS — R262 Difficulty in walking, not elsewhere classified: Secondary | ICD-10-CM | POA: Diagnosis not present

## 2014-08-10 DIAGNOSIS — G809 Cerebral palsy, unspecified: Secondary | ICD-10-CM | POA: Diagnosis not present

## 2014-08-15 NOTE — Op Note (Signed)
PATIENT NAME:  [name], [name] MR#:  [id number] DATE OF BIRTH:  12/07/1940  DATE OF PROCEDURE:  08/22/2013  PREOPERATIVE DIAGNOSIS:  Hematuria.   POSTOPERATIVE DIAGNOSIS:  Hematuria of unknown origin.   PROCEDURE:  Cystoscopy.   ANESTHESIA:  General.   PATIENT POSITION:  Supine lithotomy.   PROCEDURE: Appropriate timeout, witnessed and acknowledged by 3 different people. We begin the procedure. Cystoscopy is done after proper sterile prepping and draping.  The rigid 57 French sheath with a 4 oblique lens is utilized to view the urethra and bladder. The urethra is normal throughout its length with no evidence of stricture.  Prostatic urethra is short with a slight middle lobe hypertrophy but otherwise normal nonobstructive prostate.  Bladder wall is  appropriately thickened.  This patient has cerebral palsy and he has a very thickened bladder wall.  It is not an outlet obstruction wall.  There is 1 saccule in the bladder wall on the left lateral posterior side.  It does not have any tumors, masses or growth within the saccule.  The ureter is in a normal position with a clear efflux of urine coming from each stadium-like orifice.  The interureteric ridge is normal. The dome of the bladder and lateral walls of the bladder are normal but the bladder is a little bit thickened secondary to his disease process.  There are no tumors, masses or growths within the bladder.  No calculi within the bladder.  At the end of the procedure, the bladder is emptied and 30 mL of 0.25% plain Marcaine are placed in the bladder.  The patient is sent to recovery in satisfactory condition with an B and O suppository.  He will be discharged, seen in followup in 3 weeks.    ____________________________ Janice Coffin. Elnoria Howard, DO rdh:ja D: 08/22/2013 13:55:55 ET T: 08/22/2013 14:48:45 ET JOB#: [id number]  cc: Janice Coffin. Elnoria Howard, DO, <Dictator> RICHARD D HART DO ELECTRONICALLY SIGNED 09/05/2013 14:06

## 2014-08-17 DIAGNOSIS — G809 Cerebral palsy, unspecified: Secondary | ICD-10-CM | POA: Diagnosis not present

## 2014-08-17 DIAGNOSIS — M6281 Muscle weakness (generalized): Secondary | ICD-10-CM | POA: Diagnosis not present

## 2014-08-17 DIAGNOSIS — R262 Difficulty in walking, not elsewhere classified: Secondary | ICD-10-CM | POA: Diagnosis not present

## 2014-08-24 DIAGNOSIS — R262 Difficulty in walking, not elsewhere classified: Secondary | ICD-10-CM | POA: Diagnosis not present

## 2014-08-24 DIAGNOSIS — G809 Cerebral palsy, unspecified: Secondary | ICD-10-CM | POA: Diagnosis not present

## 2014-08-24 DIAGNOSIS — M6281 Muscle weakness (generalized): Secondary | ICD-10-CM | POA: Diagnosis not present

## 2014-08-31 DIAGNOSIS — R262 Difficulty in walking, not elsewhere classified: Secondary | ICD-10-CM | POA: Diagnosis not present

## 2014-08-31 DIAGNOSIS — M6281 Muscle weakness (generalized): Secondary | ICD-10-CM | POA: Diagnosis not present

## 2014-08-31 DIAGNOSIS — G809 Cerebral palsy, unspecified: Secondary | ICD-10-CM | POA: Diagnosis not present

## 2014-09-08 DIAGNOSIS — M6281 Muscle weakness (generalized): Secondary | ICD-10-CM | POA: Diagnosis not present

## 2014-09-08 DIAGNOSIS — G809 Cerebral palsy, unspecified: Secondary | ICD-10-CM | POA: Diagnosis not present

## 2014-09-08 DIAGNOSIS — R262 Difficulty in walking, not elsewhere classified: Secondary | ICD-10-CM | POA: Diagnosis not present

## 2014-09-14 DIAGNOSIS — M6281 Muscle weakness (generalized): Secondary | ICD-10-CM | POA: Diagnosis not present

## 2014-09-14 DIAGNOSIS — G809 Cerebral palsy, unspecified: Secondary | ICD-10-CM | POA: Diagnosis not present

## 2014-09-14 DIAGNOSIS — R262 Difficulty in walking, not elsewhere classified: Secondary | ICD-10-CM | POA: Diagnosis not present

## 2014-09-21 DIAGNOSIS — R262 Difficulty in walking, not elsewhere classified: Secondary | ICD-10-CM | POA: Diagnosis not present

## 2014-09-21 DIAGNOSIS — G809 Cerebral palsy, unspecified: Secondary | ICD-10-CM | POA: Diagnosis not present

## 2014-09-21 DIAGNOSIS — M6281 Muscle weakness (generalized): Secondary | ICD-10-CM | POA: Diagnosis not present

## 2014-09-28 DIAGNOSIS — G809 Cerebral palsy, unspecified: Secondary | ICD-10-CM | POA: Diagnosis not present

## 2014-09-28 DIAGNOSIS — R262 Difficulty in walking, not elsewhere classified: Secondary | ICD-10-CM | POA: Diagnosis not present

## 2014-09-28 DIAGNOSIS — M6281 Muscle weakness (generalized): Secondary | ICD-10-CM | POA: Diagnosis not present

## 2014-10-05 DIAGNOSIS — G809 Cerebral palsy, unspecified: Secondary | ICD-10-CM | POA: Diagnosis not present

## 2014-10-05 DIAGNOSIS — R262 Difficulty in walking, not elsewhere classified: Secondary | ICD-10-CM | POA: Diagnosis not present

## 2014-10-05 DIAGNOSIS — M6281 Muscle weakness (generalized): Secondary | ICD-10-CM | POA: Diagnosis not present

## 2014-10-12 DIAGNOSIS — M6281 Muscle weakness (generalized): Secondary | ICD-10-CM | POA: Diagnosis not present

## 2014-10-12 DIAGNOSIS — G809 Cerebral palsy, unspecified: Secondary | ICD-10-CM | POA: Diagnosis not present

## 2014-10-12 DIAGNOSIS — R262 Difficulty in walking, not elsewhere classified: Secondary | ICD-10-CM | POA: Diagnosis not present

## 2014-10-19 DIAGNOSIS — R262 Difficulty in walking, not elsewhere classified: Secondary | ICD-10-CM | POA: Diagnosis not present

## 2014-10-19 DIAGNOSIS — M6281 Muscle weakness (generalized): Secondary | ICD-10-CM | POA: Diagnosis not present

## 2014-10-19 DIAGNOSIS — G809 Cerebral palsy, unspecified: Secondary | ICD-10-CM | POA: Diagnosis not present

## 2014-10-27 DIAGNOSIS — G809 Cerebral palsy, unspecified: Secondary | ICD-10-CM | POA: Diagnosis not present

## 2014-10-27 DIAGNOSIS — R262 Difficulty in walking, not elsewhere classified: Secondary | ICD-10-CM | POA: Diagnosis not present

## 2014-10-27 DIAGNOSIS — M6281 Muscle weakness (generalized): Secondary | ICD-10-CM | POA: Diagnosis not present

## 2014-10-29 ENCOUNTER — Encounter: Payer: Self-pay | Admitting: *Deleted

## 2014-10-30 ENCOUNTER — Encounter: Payer: Self-pay | Admitting: Urology

## 2014-10-30 ENCOUNTER — Ambulatory Visit (INDEPENDENT_AMBULATORY_CARE_PROVIDER_SITE_OTHER): Payer: Medicare Other | Admitting: Urology

## 2014-10-30 ENCOUNTER — Ambulatory Visit: Payer: Self-pay | Admitting: Family Medicine

## 2014-10-30 VITALS — BP 138/83 | HR 65

## 2014-10-30 DIAGNOSIS — C61 Malignant neoplasm of prostate: Secondary | ICD-10-CM | POA: Diagnosis not present

## 2014-10-30 DIAGNOSIS — R350 Frequency of micturition: Secondary | ICD-10-CM

## 2014-10-30 LAB — BLADDER SCAN AMB NON-IMAGING: SCAN RESULT: 53

## 2014-10-30 MED ORDER — ALFUZOSIN HCL ER 10 MG PO TB24: 10.0000 mg | ORAL_TABLET | Freq: Every day | ORAL | Status: DC

## 2014-10-30 NOTE — Progress Notes (Signed)
10/30/2014 6:26 PM   [name] 12-Sep-1940 [id number]  Referring provider: No referring provider defined for this encounter.  Chief Complaint  Patient presents with  . Prostate Cancer      month followup  . Urinary Frequency    HPI: [name] is a 74 year old mentally handicapped white male with PCa ( T1 (c), N0 and M0 ) Gleason score 3 + 4 adenocarinoma involving the left side of the prostate of the prostate. Tumor markers include PSA level of 4.1 ng/mL) .  He underwent brachytherapy on 11/10/2008.  His most recent PSA was <0.1 ng/mL on 05/08/2014.    His staff states he is not experiencing any difficulty.  His PVR today is 53 mL.  He is on alfuzosin.  His pants are wet today because he wears overalls and cannot get his pants down quickly enough to use the toilet.    The staff does not complain of gross hematuria, weight loss or appetite loss.     PMH: Past Medical History  Diagnosis Date  . Mental retardation   . Hematuria, gross   . BPH (benign prostatic hyperplasia)   . Lower urinary tract symptoms   . Incontinence   . Elevated PSA   . Over weight   . Urinary frequency   . Prostate cancer   . Urinary urgency     Surgical History: Past Surgical History  Procedure Laterality Date  . Prostate surgery  11/10/2008    BRACHYTHERAPY    Home Medications:    Medication List       This list is accurate as of: 10/30/14  6:26 PM.  Always use your most recent med list.               alfuzosin 10 MG 24 hr tablet  Commonly known as:  UROXATRAL  Take 1 tablet (10 mg total) by mouth daily with breakfast.     aspirin 81 MG tablet  Take 81 mg by mouth daily.     betamethasone dipropionate 0.05 % cream  Commonly known as:  DIPROLENE  Apply topically 2 (two) times daily.     Calcium + D3 600-200 MG-UNIT Tabs  Take by mouth daily.     ibuprofen 800 MG tablet  Commonly known as:  ADVIL,MOTRIN  Take 800 mg by mouth every 8 (eight) hours as needed.     losartan 50 MG tablet  Commonly known as:  COZAAR  Take by mouth.        Allergies: No Known Allergies  Family History: Family History  Problem Relation Age of Onset  . Heart disease      Social History:  reports that he has never smoked. He does not have any smokeless tobacco history on file. He reports that he does not drink alcohol or use illicit drugs.  ROS: Urological Symptom Review  Patient is experiencing the following symptoms: Frequent urination Leakage of urine   Review of Systems  Gastrointestinal (upper)  : Negative for upper GI symptoms  Gastrointestinal (lower) : Negative for lower GI symptoms  Constitutional : Negative for symptoms  Skin: Negative for skin symptoms  Eyes: Negative for eye symptoms  Ear/Nose/Throat : Negative for Ear/Nose/Throat symptoms  Hematologic/Lymphatic: Negative for Hematologic/Lymphatic symptoms  Cardiovascular : Negative for cardiovascular symptoms  Respiratory : Negative for respiratory symptoms  Endocrine: Negative for endocrine symptoms  Musculoskeletal: Negative for musculoskeletal symptoms  Neurological: Negative for neurological symptoms  Psychologic: Negative for psychiatric symptoms   Physical Exam:  BP 138/83 mmHg  Pulse 65   Rectal: Patient with  normal sphincter tone. Perineum without scarring or rashes. No rectal masses are appreciated. Prostate is small and fibrous, No nodules are appreciated. Seminal vesicles are normal.   Laboratory Data: Results for orders placed or performed in visit on 10/30/14  BLADDER SCAN AMB NON-IMAGING  Result Value Ref Range   Scan Result 53    Lab Results  Component Value Date   WBC 5.5 08/14/2013   HGB 14.1 08/14/2013   HCT 40.3 08/14/2013   MCV 96 08/14/2013   PLT 143* 08/14/2013    Lab Results  Component Value Date   CREATININE 0.71 08/14/2013    No results found for: PSA  No results found for: TESTOSTERONE  No results found for:  HGBA1C  Urinalysis No results found for: COLORURINE, APPEARANCEUR, LABSPEC, PHURINE, GLUCOSEU, HGBUR, BILIRUBINUR, KETONESUR, PROTEINUR, UROBILINOGEN, NITRITE, LEUKOCYTESUR  Pertinent Imaging:   Assessment & Plan:    1. Prostate cancer:   Patient was diagnosed with a ( T1 (c), N0 and M0 ) Gleason score 3 + 4 adenocarinoma involving the left side of the prostate of the prostate. Tumor markers include PSA level of 4.1 ng/mL.  Current diagnosis was determined by transrectal ultrasound and needle biopsy of the prostate (completeted on 08/27/2008). Treatment has included brachytherapy (completed on 11/10/2008).  His last PSA was <0.1 ng/mL on 05/08/2014 . He has no complaints. He will follow up in one year for PSA and DRE.    - PSA  2. Urinary frequency:   Patient's staff states his urinary symptoms are well controlled on alfuzosin.  His PVR is 53 mL.  He will continue the alfuzosin.  Refills are sent to his pharmacy.  He will follow up in one year for PVR and symptom recheck.    - BLADDER SCAN AMB NON-IMAGING   Return in about 1 year (around 10/30/2015) for PSA, PVR and DRE.  Zara Council, [facility] [address], [place], [place] [address] [phone]

## 2014-10-31 LAB — PSA

## 2014-11-02 DIAGNOSIS — R262 Difficulty in walking, not elsewhere classified: Secondary | ICD-10-CM | POA: Diagnosis not present

## 2014-11-02 DIAGNOSIS — M6281 Muscle weakness (generalized): Secondary | ICD-10-CM | POA: Diagnosis not present

## 2014-11-02 DIAGNOSIS — G809 Cerebral palsy, unspecified: Secondary | ICD-10-CM | POA: Diagnosis not present

## 2014-11-03 ENCOUNTER — Ambulatory Visit: Payer: Self-pay | Admitting: Urology

## 2014-11-09 ENCOUNTER — Telehealth: Payer: Self-pay

## 2014-11-09 DIAGNOSIS — G809 Cerebral palsy, unspecified: Secondary | ICD-10-CM | POA: Diagnosis not present

## 2014-11-09 DIAGNOSIS — M6281 Muscle weakness (generalized): Secondary | ICD-10-CM | POA: Diagnosis not present

## 2014-11-09 DIAGNOSIS — R262 Difficulty in walking, not elsewhere classified: Secondary | ICD-10-CM | POA: Diagnosis not present

## 2014-11-09 NOTE — Telephone Encounter (Signed)
Pt pharmacy sent a verification order. Do you still want pt taking uroxatral, if so should pt take it 7 a.m. Or p.m.? Please advise.

## 2014-11-09 NOTE — Telephone Encounter (Signed)
Left message with Dolan Amen in reference to PSA.

## 2014-11-09 NOTE — Telephone Encounter (Signed)
-----   Message from Nori Riis, PA-C sent at 11/08/2014  9:33 AM EDT ----- PSA remains undetectable. We will see him in one year.

## 2014-11-09 NOTE — Telephone Encounter (Signed)
He may take it in the morning for the evening which ever is more convenient for him. He just needs to take it 30 minutes after a meal.

## 2014-11-10 NOTE — Telephone Encounter (Signed)
Nurse called and spoke with Estill Bamberg, at pt pharmacy, gave clarification of rx.

## 2014-11-20 ENCOUNTER — Emergency Department
Admission: EM | Admit: 2014-11-20 | Discharge: 2014-11-20 | Disposition: A | Discharge status: Home / Self Care | Payer: Medicare Other | Attending: Emergency Medicine | Admitting: Emergency Medicine

## 2014-11-20 DIAGNOSIS — W050XXA Fall from non-moving wheelchair, initial encounter: Secondary | ICD-10-CM | POA: Insufficient documentation

## 2014-11-20 DIAGNOSIS — S79921A Unspecified injury of right thigh, initial encounter: Secondary | ICD-10-CM | POA: Diagnosis not present

## 2014-11-20 DIAGNOSIS — Y9389 Activity, other specified: Secondary | ICD-10-CM | POA: Insufficient documentation

## 2014-11-20 DIAGNOSIS — Y998 Other external cause status: Secondary | ICD-10-CM | POA: Insufficient documentation

## 2014-11-20 DIAGNOSIS — M79651 Pain in right thigh: Secondary | ICD-10-CM | POA: Diagnosis not present

## 2014-11-20 DIAGNOSIS — Y92129 Unspecified place in nursing home as the place of occurrence of the external cause: Secondary | ICD-10-CM | POA: Insufficient documentation

## 2014-11-20 DIAGNOSIS — W19XXXA Unspecified fall, initial encounter: Secondary | ICD-10-CM

## 2014-11-20 DIAGNOSIS — M549 Dorsalgia, unspecified: Secondary | ICD-10-CM | POA: Diagnosis not present

## 2014-11-20 DIAGNOSIS — M25551 Pain in right hip: Secondary | ICD-10-CM | POA: Diagnosis not present

## 2014-11-20 NOTE — ED Notes (Signed)
Assessment per PA 

## 2014-11-20 NOTE — ED Notes (Signed)
Pt fell out of his wheelchair this am at 0730, staff think his wheel may have broken off, pt c/o rt hip and back pain. Pt is able to get up and walk

## 2014-11-20 NOTE — ED Provider Notes (Signed)
[facility] Emergency Department Provider Note ____________________________________________  Time seen: 1055  I have reviewed the triage vital signs and the nursing notes.  HISTORY  Chief Complaint  Fall  HPI [name] is a 74 y.o. male who reports to the ED for evaluation of minor injury sustained after the wheel on his wheelchair broke off. The caregiver present in the room reports that he slipped out of the seated position and landed against another soft chair in the living room area. He reported some minor pain to the right lower back, and right thigh after the fall from seated position. Since that time he has been at his baseline level of activity, able to transfer from his wheelchair to bed without assistance. He reports minimal pain at this time. There is no report of any head injury, loss of consciousness, or nausea and vomiting.  No past medical history on file.  There are no active problems to display for this patient.  No past surgical history on file.  No current outpatient prescriptions on file.  Allergies Review of patient's allergies indicates no known allergies.  No family history on file.  Social History History  Substance Use Topics  . Smoking status: Not on file  . Smokeless tobacco: Not on file  . Alcohol Use: Not on file   Review of Systems  Constitutional: Negative for fever. Eyes: Negative for visual changes. ENT: Negative for sore throat. Cardiovascular: Negative for chest pain. Respiratory: Negative for shortness of breath. Gastrointestinal: Negative for abdominal pain, vomiting and diarrhea. Genitourinary: Negative for dysuria. Musculoskeletal: Negative for back pain. Minor right thigh pain. Skin: Negative for rash. Neurological: Negative for headaches, focal weakness or numbness. ____________________________________________  PHYSICAL EXAM:  VITAL SIGNS: ED Triage Vitals  Enc Vitals Group     BP 11/20/14 1012  140/80 mmHg     Pulse Rate 11/20/14 1012 80     Resp 11/20/14 1012 18     Temp 11/20/14 1012 98.3 F (36.8 C)     Temp Source 11/20/14 1012 Oral     SpO2 11/20/14 1012 97 %     Weight 11/20/14 1012 174 lb (78.926 kg)     Height 11/20/14 1012 5\' 5"  (1.651 m)     Head Cir --      Peak Flow --      Pain Score --      Pain Loc --      Pain Edu? --      Excl. in [place]? --    Constitutional: Alert and oriented. Well appearing and in no distress. Eyes: Conjunctivae are normal. PERRL. Normal extraocular movements. ENT   Head: Normocephalic and atraumatic.   Nose: No congestion/rhinnorhea.   Mouth/Throat: Mucous membranes are moist.   Neck: Supple. No thyromegaly. Hematological/Lymphatic/Immunilogical: No cervical lymphadenopathy. Cardiovascular: Normal rate, regular rhythm.  Respiratory: Normal respiratory effort. No wheezes/rales/rhonchi. Gastrointestinal: Soft and nontender. No distention. Musculoskeletal: Nontender with normal range of motion in all extremities. Normal spinal alignment without midline tenderness or muscle spasms. Patient able to transition from supine to sit without assistance.  Neurologic:  Normal speech and language. No gross focal neurologic deficits are appreciated. Skin:  Skin is warm, dry and intact. No rash noted. Superficial abrasion noted the the right upper arm at the elbow.  Psychiatric: Mood and affect are normal. Patient exhibits appropriate insight and judgment. ____________________________________________  INITIAL IMPRESSION / ASSESSMENT AND PLAN / ED COURSE  Mechanical fall due to faulty wheelchair. Minor abrasion to the right arm  and strain to the right thigh. Normal exam without any evidence of acute injury.  Treat symptoms at home with Tylenol and Motrin.  Follow-up with Dr. Ancil Boozer as needed.  Discharged to the care of his caregiver/attendant. ____________________________________________  FINAL CLINICAL IMPRESSION(S) / ED  DIAGNOSES  Final diagnoses:  Fall at nursing home, initial encounter  Fall from wheelchair, initial encounter     Melvenia Needles, PA-C 11/20/14 [place], MD 11/20/14 1536

## 2014-11-20 NOTE — Discharge Instructions (Signed)

## 2014-11-23 DIAGNOSIS — G809 Cerebral palsy, unspecified: Secondary | ICD-10-CM | POA: Diagnosis not present

## 2014-11-23 DIAGNOSIS — R262 Difficulty in walking, not elsewhere classified: Secondary | ICD-10-CM | POA: Diagnosis not present

## 2014-11-23 DIAGNOSIS — M6281 Muscle weakness (generalized): Secondary | ICD-10-CM | POA: Diagnosis not present

## 2014-11-30 DIAGNOSIS — G809 Cerebral palsy, unspecified: Secondary | ICD-10-CM | POA: Diagnosis not present

## 2014-11-30 DIAGNOSIS — M6281 Muscle weakness (generalized): Secondary | ICD-10-CM | POA: Diagnosis not present

## 2014-11-30 DIAGNOSIS — R262 Difficulty in walking, not elsewhere classified: Secondary | ICD-10-CM | POA: Diagnosis not present

## 2015-02-10 ENCOUNTER — Other Ambulatory Visit: Payer: Self-pay

## 2015-02-10 NOTE — Telephone Encounter (Signed)
Rx refill request sent to Dr. Ancil Boozer

## 2015-02-10 NOTE — Telephone Encounter (Signed)
rx refill already done

## 2015-02-11 ENCOUNTER — Ambulatory Visit (INDEPENDENT_AMBULATORY_CARE_PROVIDER_SITE_OTHER): Payer: Medicare Other | Admitting: Family Medicine

## 2015-02-11 ENCOUNTER — Encounter: Payer: Self-pay | Admitting: Family Medicine

## 2015-02-11 VITALS — BP 128/86 | HR 75 | Temp 98.4°F | Resp 16 | Ht 66.0 in | Wt 166.5 lb

## 2015-02-11 DIAGNOSIS — E669 Obesity, unspecified: Secondary | ICD-10-CM | POA: Insufficient documentation

## 2015-02-11 DIAGNOSIS — R634 Abnormal weight loss: Secondary | ICD-10-CM

## 2015-02-11 DIAGNOSIS — Z23 Encounter for immunization: Secondary | ICD-10-CM

## 2015-02-11 DIAGNOSIS — R350 Frequency of micturition: Secondary | ICD-10-CM

## 2015-02-11 DIAGNOSIS — Z993 Dependence on wheelchair: Secondary | ICD-10-CM | POA: Diagnosis not present

## 2015-02-11 DIAGNOSIS — N3941 Urge incontinence: Secondary | ICD-10-CM | POA: Insufficient documentation

## 2015-02-11 DIAGNOSIS — L309 Dermatitis, unspecified: Secondary | ICD-10-CM | POA: Diagnosis not present

## 2015-02-11 DIAGNOSIS — H509 Unspecified strabismus: Secondary | ICD-10-CM

## 2015-02-11 DIAGNOSIS — R739 Hyperglycemia, unspecified: Secondary | ICD-10-CM | POA: Diagnosis not present

## 2015-02-11 DIAGNOSIS — Z0000 Encounter for general adult medical examination without abnormal findings: Secondary | ICD-10-CM

## 2015-02-11 DIAGNOSIS — F79 Unspecified intellectual disabilities: Secondary | ICD-10-CM | POA: Diagnosis not present

## 2015-02-11 MED ORDER — BETAMETHASONE DIPROPIONATE 0.05 % EX CREA: TOPICAL_CREAM | Freq: Two times a day (BID) | CUTANEOUS | Status: DC

## 2015-02-11 NOTE — Progress Notes (Signed)
Name: [name]   MRN: [id number]    DOB: 08-12-1940   Date:02/11/2015       Progress Note  Subjective  Chief Complaint  Chief Complaint  Patient presents with  . Annual Exam    FL2 forms    HPI   Functional ability/safety issues: lives in a group home, needs assistance with taking medication, bathing, changing.  Hearing issues: Addressed  Activities of daily living: needs assistance Home safety issues: No Issues  End Of Life Planning: Offered verbal information regarding advanced directives, healthcare power of attorney. Caregiver is unsure  Preventative care, Health maintenance, Preventative health measures discussed.  Preventative screenings discussed today: lab work, colonoscopy - not indicated based on age and history of prostate cancer , PSA per urologist.  Men age 65 to 70 years if ever smoked recommended to get a one time AAA ultrasound screening exam. N/A  Low Dose CT Chest recommended if Age 65-80 years, 30 pack-year currently smoking OR have quit w/in 15years.   Lifestyle risk factor issued reviewed: Diet, exercise, weight management, advised patient smoking is not healthy, nutrition/diet.  Preventative health measures discussed (5-10 year plan).  Reviewed and recommended vaccinations: - Pneumovax  - Prevnar  - Annual Influenza - Zostavax - not covered by insurance - Tdap   Depression screening: Done Fall risk screening: Done Discuss ADLs/IADLs: Done  Current medical providers: See HPI  Other health risk factors identified this visit: No other issues Cognitive impairment issues: None identified  All above discussed with patient. Appropriate education, counseling and referral will be made based upon the above.   Urinary frequency: he went to the restroom multiple times while waiting to be seen, no dysuria, he sees Urologist for Prostate cancer, no back pain or fever  Weight loss: he continues to lose weight, we will check labs, he has prostate  cancer. Caregiver states he has a good appetite. No change in bowel movements, no behavior changes  Eczema: needs refill of cream, doing very well, rash on legs resolved, no pruritus.   MR: no behavior problems, cooperative, wheelchair dependent from CP   Patient Active Problem List   Diagnosis Date Noted  . Mental retardation 02/11/2015  . Strabismus 02/11/2015  . Urge incontinence of urine 02/11/2015  . Wheelchair dependence 02/11/2015  . Eczema 02/11/2015  . Obesity 02/11/2015  . Prostate cancer ([place]) 10/30/2014  . Urinary frequency 10/30/2014    Past Surgical History  Procedure Laterality Date  . Prostate surgery  11/10/2008    BRACHYTHERAPY    Family History  Problem Relation Age of Onset  . Heart disease      Social History   Social History  . Marital Status: Single    Spouse Name: N/A  . Number of Children: N/A  . Years of Education: N/A   Occupational History  . Not on file.   Social History Main Topics  . Smoking status: Never Smoker   . Smokeless tobacco: Never Used  . Alcohol Use: No  . Drug Use: No  . Sexual Activity: No   Other Topics Concern  . Not on file   Social History Narrative     Current outpatient prescriptions:  .  alfuzosin (UROXATRAL) 10 MG 24 hr tablet, Take 1 tablet (10 mg total) by mouth daily with breakfast., Disp: 90 tablet, Rfl: 3 .  aspirin 81 MG tablet, Take 81 mg by mouth daily., Disp: , Rfl:  .  betamethasone dipropionate (DIPROLENE) 0.05 % cream, Apply topically 2 (two) times  daily., Disp: 45 g, Rfl: 12 .  Calcium Carb-Cholecalciferol (CALCIUM-VITAMIN D3) 500-400 MG-UNIT TABS, Take 500 Units by mouth., Disp: , Rfl:   No Known Allergies   ROS  Constitutional: Negative for fever , positive for weight change.  Respiratory: Negative for cough and shortness of breath.   Cardiovascular: Negative for chest pain or palpitations.  Gastrointestinal: Negative for abdominal pain, no bowel changes.  Musculoskeletal: Positive  for gait problem no  joint swelling.  Skin: Negative for rash - doing well with cream.  Neurological: Negative for dizziness or headache.  No other specific complaints in a complete review of systems (except as listed in HPI above).  Objective  Filed Vitals:   02/11/15 1225  BP: 128/86  Pulse: 75  Temp: 98.4 F (36.9 C)  TempSrc: Oral  Resp: 16  Height: 5\' 6"  (1.676 m)  Weight: 166 lb 8 oz (75.524 kg)  SpO2: 97%    Body mass index is 26.89 kg/(m^2).  Physical Exam  Constitutional: Patient appears well-developed . Obese No distress.  HEENT: head atraumatic, normocephalic, pupils equal and reactive to light,  neck supple, throat within normal limits, strabismus Cardiovascular: Normal rate, regular rhythm and normal heart sounds.  No murmur heard. Trace BLE edema. Pulmonary/Chest: Effort normal and breath sounds normal. No respiratory distress. Abdominal: Soft.  There is no tenderness. Psychiatric: Patient has a normal mood and affect. Cooperative, speaks, but difficult to understand Muscular Skeletal: lower extremity atrophy and also on both hands, no back pain, wheelchair bound   PHQ2/9: Depression screen PHQ 2/9 02/11/2015  Decreased Interest 0  Down, Depressed, Hopeless 0  PHQ - 2 Score 0     Fall Risk: Fall Risk  02/11/2015  Falls in the past year? Yes  Number falls in past yr: 1  Injury with Fall? No     Functional Status Survey: Is the patient deaf or have difficulty hearing?: No Does the patient have difficulty seeing, even when wearing glasses/contacts?: Yes (glasses) Does the patient have difficulty concentrating, remembering, or making decisions?: Yes Does the patient have difficulty walking or climbing stairs?: Yes (in wheelchair) Does the patient have difficulty dressing or bathing?: Yes Does the patient have difficulty doing errands alone such as visiting a doctor's office or shopping?: Yes    Assessment & Plan  1. Medicare annual wellness  visit, subsequent  Discussed importance eat two servings of fish weekly, eat one serving of tree nuts ( cashews, pistachios, pecans, almonds.Marland Kitchen) every other day, eat 6 servings of fruit/vegetables daily and drink plenty of water and avoid sweet beverages.   2. Needs flu shot  - Flu vaccine HIGH DOSE PF (Fluzone High dose)  3. Mental retardation   stable  4. Strabismus  stable  5. Wheelchair dependence   And needs assistance with transferring  6. Eczema  Doing well, rash resolved - betamethasone dipropionate (DIPROLENE) 0.05 % cream; Apply topically 2 (two) times daily.  Dispense: 45 g; Refill: 12  7. Obesity  Losing weight, unwanted, we will check labs  8. Urinary frequency  It may be from prostate, continue medication, and follow up with Urologist, we will treat with antibiotics if culture positive - Urine culture  9. Weight loss  Check labs, - Comprehensive metabolic panel - CBC with Differential/Platelet - TSH  10. Need for shingles vaccine  - Varicella-zoster vaccine subcutaneous - not covered by insurance

## 2015-02-12 ENCOUNTER — Other Ambulatory Visit: Payer: Self-pay | Admitting: Family Medicine

## 2015-02-12 DIAGNOSIS — R739 Hyperglycemia, unspecified: Secondary | ICD-10-CM

## 2015-02-12 DIAGNOSIS — R350 Frequency of micturition: Secondary | ICD-10-CM | POA: Diagnosis not present

## 2015-02-12 LAB — CBC WITH DIFFERENTIAL/PLATELET
Basophils Absolute: 0 10*3/uL (ref 0.0–0.2)
Basos: 0 %
EOS (ABSOLUTE): 0 10*3/uL (ref 0.0–0.4)
Eos: 1 %
Hematocrit: 41.8 % (ref 37.5–51.0)
Hemoglobin: 14.7 g/dL (ref 12.6–17.7)
IMMATURE GRANS (ABS): 0 10*3/uL (ref 0.0–0.1)
IMMATURE GRANULOCYTES: 0 %
LYMPHS: 19 %
Lymphocytes Absolute: 0.9 10*3/uL (ref 0.7–3.1)
MCH: 32.5 pg (ref 26.6–33.0)
MCHC: 35.2 g/dL (ref 31.5–35.7)
MCV: 93 fL (ref 79–97)
MONOS ABS: 0.3 10*3/uL (ref 0.1–0.9)
Monocytes: 5 %
NEUTROS PCT: 75 %
Neutrophils Absolute: 3.6 10*3/uL (ref 1.4–7.0)
PLATELETS: 168 10*3/uL (ref 150–379)
RBC: 4.52 x10E6/uL (ref 4.14–5.80)
RDW: 13.3 % (ref 12.3–15.4)
WBC: 4.8 10*3/uL (ref 3.4–10.8)

## 2015-02-12 LAB — COMPREHENSIVE METABOLIC PANEL
ALT: 11 IU/L (ref 0–44)
AST: 15 IU/L (ref 0–40)
Albumin/Globulin Ratio: 1.7 (ref 1.1–2.5)
Albumin: 4.2 g/dL (ref 3.5–4.8)
Alkaline Phosphatase: 118 IU/L — ABNORMAL HIGH (ref 39–117)
BUN/Creatinine Ratio: 15 (ref 10–22)
BUN: 11 mg/dL (ref 8–27)
Bilirubin Total: 0.5 mg/dL (ref 0.0–1.2)
CO2: 24 mmol/L (ref 18–29)
CREATININE: 0.72 mg/dL — AB (ref 0.76–1.27)
Calcium: 8.9 mg/dL (ref 8.6–10.2)
Chloride: 102 mmol/L (ref 97–106)
GFR calc Af Amer: 108 mL/min/{1.73_m2} (ref >59)
GFR calc non Af Amer: 93 mL/min/{1.73_m2} (ref >59)
Globulin, Total: 2.5 g/dL (ref 1.5–4.5)
Glucose: 126 mg/dL — ABNORMAL HIGH (ref 65–99)
Potassium: 4.1 mmol/L (ref 3.5–5.2)
Sodium: 139 mmol/L (ref 136–144)
Total Protein: 6.7 g/dL (ref 6.0–8.5)

## 2015-02-12 LAB — TSH: TSH: 3.01 u[IU]/mL (ref 0.450–4.500)

## 2015-02-13 LAB — URINE CULTURE: Organism ID, Bacteria: NO GROWTH

## 2015-02-13 LAB — HGB A1C W/O EAG: HEMOGLOBIN A1C: 4.9 % (ref 4.8–5.6)

## 2015-02-15 LAB — SPECIMEN STATUS REPORT

## 2015-02-17 ENCOUNTER — Telehealth: Payer: Self-pay | Admitting: Family Medicine

## 2015-02-17 NOTE — Telephone Encounter (Signed)
[name] from [facility] stopped by and is requesting a D/C order for Diprolene 0.05% cream. Please fax the D/C to [facility]

## 2015-02-17 NOTE — Telephone Encounter (Signed)
Sent d/c order to Dr. Ancil Boozer and faxed.

## 2015-02-19 DIAGNOSIS — S20222A Contusion of left back wall of thorax, initial encounter: Secondary | ICD-10-CM | POA: Diagnosis not present

## 2015-02-23 ENCOUNTER — Other Ambulatory Visit: Payer: Self-pay

## 2015-02-23 DIAGNOSIS — L309 Dermatitis, unspecified: Secondary | ICD-10-CM

## 2015-02-23 MED ORDER — BETAMETHASONE DIPROPIONATE 0.05 % EX CREA: TOPICAL_CREAM | Freq: Two times a day (BID) | CUTANEOUS | Status: DC

## 2015-02-23 NOTE — Telephone Encounter (Signed)
Patient requesting refill. 

## 2015-02-24 ENCOUNTER — Telehealth: Payer: Self-pay | Admitting: Family Medicine

## 2015-02-24 NOTE — Telephone Encounter (Signed)
[place] notified

## 2015-02-24 NOTE — Telephone Encounter (Signed)
[name] from [facility] needing clarification on Diprolene. Originally it was PRN and yesterday they received a prescription for it to be scheduled. They would like to know how you would like to do this.

## 2015-02-24 NOTE — Telephone Encounter (Signed)
Change to prn please

## 2015-02-28 ENCOUNTER — Emergency Department: Payer: Medicare Other

## 2015-02-28 ENCOUNTER — Emergency Department
Admission: EM | Admit: 2015-02-28 | Discharge: 2015-02-28 | Disposition: A | Discharge status: Home / Self Care | Payer: Medicare Other | Attending: Emergency Medicine | Admitting: Emergency Medicine

## 2015-02-28 DIAGNOSIS — Y92129 Unspecified place in nursing home as the place of occurrence of the external cause: Secondary | ICD-10-CM | POA: Diagnosis not present

## 2015-02-28 DIAGNOSIS — Y9389 Activity, other specified: Secondary | ICD-10-CM | POA: Insufficient documentation

## 2015-02-28 DIAGNOSIS — S01112A Laceration without foreign body of left eyelid and periocular area, initial encounter: Secondary | ICD-10-CM | POA: Diagnosis not present

## 2015-02-28 DIAGNOSIS — Y998 Other external cause status: Secondary | ICD-10-CM | POA: Insufficient documentation

## 2015-02-28 DIAGNOSIS — S0990XA Unspecified injury of head, initial encounter: Secondary | ICD-10-CM | POA: Insufficient documentation

## 2015-02-28 DIAGNOSIS — S0181XA Laceration without foreign body of other part of head, initial encounter: Secondary | ICD-10-CM | POA: Diagnosis not present

## 2015-02-28 DIAGNOSIS — W19XXXA Unspecified fall, initial encounter: Secondary | ICD-10-CM | POA: Diagnosis not present

## 2015-02-28 NOTE — ED Provider Notes (Signed)
[facility] Emergency Department Provider Note   ____________________________________________  Time seen: 12:45 PM I have reviewed the triage vital signs and the triage nursing note.  HISTORY  Chief Complaint Head Laceration   Historian Patient and group home staff  HPI [name] is a 74 y.o. male secondary group home, but does take aspirin daily, who is here after head injury. His wheelchair reportedly collapsed underneath of him and he struck his forehead with a laceration above his left eye. No loss of consciousness. No neck pain. No chest pain. No abdominal pain. No extremity pain.  Group home staff says the patient is a reliable historian in terms of being able to state whether or not he has pain.    No past medical history on file.  There are no active problems to display for this patient.   No past surgical history on file.  No current outpatient prescriptions on file.  Allergies Review of patient's allergies indicates no known allergies.  No family history on file.  Social History Social History  Substance Use Topics  . Smoking status: Not on file  . Smokeless tobacco: Not on file  . Alcohol Use: Not on file    Review of Systems  Constitutional: Negative for fever. Eyes: Negative for visual changes. ENT: Negative for sore throat. Cardiovascular: Negative for chest pain. Respiratory: Negative for shortness of breath. Gastrointestinal: Negative for abdominal pain, vomiting and diarrhea. Genitourinary: Negative for dysuria. Musculoskeletal: Negative for back pain. Negative for neck pain Skin: Negative for rash. Neurological: Negative for headache. 10 point Review of Systems otherwise negative ____________________________________________   PHYSICAL EXAM:  VITAL SIGNS: ED Triage Vitals  Enc Vitals Group     BP 02/28/15 1232 157/97 mmHg     Pulse Rate 02/28/15 1232 88     Resp --      Temp --      Temp src --       SpO2 02/28/15 1232 100 %     Weight 02/28/15 1232 160 lb (72.576 kg)     Height 02/28/15 1232 5\' 5"  (1.651 m)     Head Cir --      Peak Flow --      Pain Score --      Pain Loc --      Pain Edu? --      Excl. in [place]? --      Constitutional: Alert and cooperative. Well appearing and in no distress. Eyes: Conjunctivae are normal. Crossed eyes. Pupils equal and round.  ENT   Head: Normocephalic.  To severe laceration curvilinear over the left eyebrow.   Nose: No congestion/rhinnorhea.   Mouth/Throat: Mucous membranes are moist.   Neck: No stridor.  Cardiovascular/Chest: Normal rate, regular rhythm.  No murmurs, rubs, or gallops. Respiratory: Normal respiratory effort without tachypnea nor retractions. Breath sounds are clear and equal bilaterally. No wheezes/rales/rhonchi. Gastrointestinal: Soft. No distention, no guarding, no rebound. Nontender   Genitourinary/rectal:Deferred Musculoskeletal: Nontender with normal range of motion in all extremities. No joint effusions.  No lower extremity tenderness.  No edema. Neurologic:   No gross or focal neurologic deficits are appreciated. Skin:  Skin is warm, dry and intact. No rash noted.   ____________________________________________   EKG I, Lisa Roca, MD, the attending physician have personally viewed and interpreted all ECGs.  No EKG performed ____________________________________________  LABS (pertinent positives/negatives)  None  ____________________________________________  RADIOLOGY All Xrays were viewed by me. Imaging interpreted by Radiologist.  CT head noncontrast: Mild  atrophy and microvascular ischemic disease without acute intracranial process __________________________________________  PROCEDURES  Procedure(s) performed: LACERATION REPAIR Performed by: Lisa Roca Authorized by: Lisa Roca Consent: Verbal consent obtained. Risks and benefits: risks, benefits and alternatives were  discussed Consent given by: patient Patient identity confirmed: provided demographic data Prepped in normal sterile fashion Wound explored  Laceration Location: left forehead   Laceration Length:2 cm  No Foreign Bodies seen or palpated  Irrigation method: syringe Amount of cleaning: standard  Skin closure:  Skin glue     Patient tolerance: Patient tolerated the procedure well with no immediate complications.   Critical Care performed: None  ____________________________________________   ED COURSE / ASSESSMENT AND PLAN  CONSULTATIONS: None  Pertinent labs & imaging results that were available during my care of the patient were reviewed by me and considered in my medical decision making (see chart for details).  Patient is overall well-appearing, and I suspect the mechanism was fairly low, however the patient is elderly and on aspirin, and so I will obtain a head CT to rule out intracranial injury. Laceration was repaired with skin glue.  Patient / Family / Caregiver informed of clinical course, medical decision-making process, and agree with plan.   I discussed return precautions, follow-up instructions, and discharged instructions with patient and/or family.  ___________________________________________   FINAL CLINICAL IMPRESSION(S) / ED DIAGNOSES   Final diagnoses:  Laceration of face, initial encounter       Lisa Roca, MD 02/28/15 1415

## 2015-02-28 NOTE — ED Notes (Signed)
Pt has a laceration above his left eye that is about 2cm. Pt denies pain or dizziness.

## 2015-02-28 NOTE — ED Notes (Signed)
NAD at this time. Caregiver verbalized understanding of discharge instructions.

## 2015-02-28 NOTE — Discharge Instructions (Signed)
Your skin laceration was repaired with skin glue. Glue will pill off on its own in 7-10 days.  Return to the emergency department for any new or worsening condition including any new pain, weakness, numbness, altered mental status, or any other condition concerning to you.   Stitches, Staples, or Adhesive Wound Closure Doctors use stitches (sutures), staples, and certain glue (skin adhesives) to hold your skin together while it heals (wound closure). You may need this treatment after you have surgery or if you cut your skin accidentally. These methods help your skin heal more quickly. They also make it less likely that you will have a scar. WHAT ARE THE DIFFERENT KINDS OF WOUND CLOSURES? There are many options for wound closure. The one that your doctor uses depends on how deep and large your wound is. Adhesive Glue To use this glue to close a wound, your doctor holds the edges of the wound together and paints the glue on the surface of your skin. You may need more than one layer of glue. Then the wound may be covered with a light bandage (dressing). This type of skin closure may be used for small wounds that are not deep (superficial). Using glue for wound closure is less painful than other methods. It does not require a medicine that numbs the area. This method also leaves nothing to be removed. Adhesive glue is often used for children and on facial wounds. Adhesive glue cannot be used for wounds that are deep, uneven, or bleeding. It is not used inside of a wound.  Adhesive Strips These strips are made of sticky (adhesive), porous paper. They are placed across your skin edges like a regular adhesive bandage. You leave them on until they fall off. Adhesive strips may be used to close very superficial wounds. They may also be used along with sutures to improve closure of your skin edges.  Sutures Sutures are the oldest method of wound closure. Sutures can be made from natural or synthetic  materials. They can be made from a material that your body can break down as your wound heals (absorbable), or they can be made from a material that needs to be removed from your skin (nonabsorbable). They come in many different strengths and sizes. Your doctor attaches the sutures to a steel needle on one end. Sutures can be passed through your skin, or through the tissues beneath your skin. Then they are tied and cut. Your skin edges may be closed in one continuous stitch or in separate stitches. Sutures are strong and can be used for all kinds of wounds. Absorbable sutures may be used to close tissues under the skin. The disadvantage of sutures is that they may cause skin reactions that lead to infection. Nonabsorbable sutures need to be removed. Staples When surgical staples are used to close a wound, the edges of your skin on both sides of the wound are brought close together. A staple is placed across the wound, and an instrument secures the edges together. Staples are often used to close surgical cuts (incisions). Staples are faster to use than sutures, and they cause less reaction from your skin. Staples need to be removed using a tool that bends the staples away from your skin. HOW DO I CARE FOR MY WOUND CLOSURE?  Take medicines only as told by your doctor.  If you were prescribed an antibiotic medicine for your wound, finish it all even if you start to feel better.  Use ointments or creams only as  told by your doctor.  Wash your hands with soap and water before and after touching your wound.  Do not soak your wound in water. Do not take baths, swim, or use a hot tub until your doctor says it is okay.  Ask your doctor when you can start showering. Cover your wound if told by your doctor.  Do not take out your own sutures or staples.  Do not pick at your wound. Picking can cause an infection.  Keep all follow-up visits as told by your doctor. This is important. HOW LONG WILL I HAVE  MY WOUND CLOSURE?   Leave adhesive glue on your skin until the glue peels away.  Leave adhesive strips on your skin until they fall off.  Absorbable sutures will dissolve within several days.  Nonabsorbable sutures and staples must be removed. The location of the wound will determine how long they stay in. This can range from several days to a couple of weeks. WHEN SHOULD I SEEK HELP FOR MY WOUND CLOSURE? Contact your doctor if:  You have a fever.  You have chills.  You have redness, puffiness (swelling), or pain at the site of your wound.  You have fluid, blood, or pus coming from your wound.  There is a bad smell coming from your wound.  The skin edges of your wound start to separate after your sutures have been removed.  Your wound becomes thick, raised, and darker in color after your sutures come out (scarring).   This information is not intended to replace advice given to you by your health care provider. Make sure you discuss any questions you have with your health care provider.   Document Released: 02/05/2009 Document Revised: 05/01/2014 Document Reviewed: 09/17/2013 Elsevier Interactive Patient Education [employer].

## 2015-02-28 NOTE — ED Notes (Signed)
Pt arrived by EMS from group home where his wheel chair collapsed and he fell and hit his head. Pt has a laceration above his left eye. Pt denies pain, dizziness or arm/shoulder pain. NAD at this time.

## 2015-03-02 ENCOUNTER — Ambulatory Visit (INDEPENDENT_AMBULATORY_CARE_PROVIDER_SITE_OTHER): Payer: Medicare Other | Admitting: Family Medicine

## 2015-03-02 ENCOUNTER — Encounter: Payer: Self-pay | Admitting: Family Medicine

## 2015-03-02 VITALS — BP 128/72 | HR 70 | Temp 97.9°F | Resp 14

## 2015-03-02 DIAGNOSIS — IMO0002 Reserved for concepts with insufficient information to code with codable children: Secondary | ICD-10-CM

## 2015-03-02 DIAGNOSIS — R32 Unspecified urinary incontinence: Secondary | ICD-10-CM | POA: Insufficient documentation

## 2015-03-02 DIAGNOSIS — F79 Unspecified intellectual disabilities: Secondary | ICD-10-CM | POA: Insufficient documentation

## 2015-03-02 DIAGNOSIS — I679 Cerebrovascular disease, unspecified: Secondary | ICD-10-CM | POA: Insufficient documentation

## 2015-03-02 DIAGNOSIS — E669 Obesity, unspecified: Secondary | ICD-10-CM | POA: Insufficient documentation

## 2015-03-02 DIAGNOSIS — H5010 Unspecified exotropia: Secondary | ICD-10-CM | POA: Insufficient documentation

## 2015-03-02 DIAGNOSIS — E559 Vitamin D deficiency, unspecified: Secondary | ICD-10-CM | POA: Insufficient documentation

## 2015-03-02 DIAGNOSIS — E785 Hyperlipidemia, unspecified: Secondary | ICD-10-CM | POA: Insufficient documentation

## 2015-03-02 DIAGNOSIS — Z9181 History of falling: Secondary | ICD-10-CM | POA: Insufficient documentation

## 2015-03-02 DIAGNOSIS — Z8546 Personal history of malignant neoplasm of prostate: Secondary | ICD-10-CM | POA: Insufficient documentation

## 2015-03-02 DIAGNOSIS — L309 Dermatitis, unspecified: Secondary | ICD-10-CM | POA: Insufficient documentation

## 2015-03-02 DIAGNOSIS — G718 Other primary disorders of muscles: Secondary | ICD-10-CM | POA: Insufficient documentation

## 2015-03-02 DIAGNOSIS — I872 Venous insufficiency (chronic) (peripheral): Secondary | ICD-10-CM | POA: Insufficient documentation

## 2015-03-02 DIAGNOSIS — G723 Periodic paralysis: Secondary | ICD-10-CM | POA: Insufficient documentation

## 2015-03-02 DIAGNOSIS — N281 Cyst of kidney, acquired: Secondary | ICD-10-CM | POA: Insufficient documentation

## 2015-03-02 DIAGNOSIS — Z0000 Encounter for general adult medical examination without abnormal findings: Secondary | ICD-10-CM | POA: Insufficient documentation

## 2015-03-02 DIAGNOSIS — G809 Cerebral palsy, unspecified: Secondary | ICD-10-CM | POA: Insufficient documentation

## 2015-03-02 DIAGNOSIS — C61 Malignant neoplasm of prostate: Secondary | ICD-10-CM | POA: Insufficient documentation

## 2015-03-02 DIAGNOSIS — R319 Hematuria, unspecified: Secondary | ICD-10-CM | POA: Insufficient documentation

## 2015-03-02 DIAGNOSIS — Z993 Dependence on wheelchair: Secondary | ICD-10-CM | POA: Insufficient documentation

## 2015-03-02 DIAGNOSIS — I890 Lymphedema, not elsewhere classified: Secondary | ICD-10-CM | POA: Insufficient documentation

## 2015-03-02 DIAGNOSIS — Z23 Encounter for immunization: Secondary | ICD-10-CM | POA: Insufficient documentation

## 2015-03-02 DIAGNOSIS — W06XXXA Fall from bed, initial encounter: Secondary | ICD-10-CM | POA: Insufficient documentation

## 2015-03-02 DIAGNOSIS — T148 Other injury of unspecified body region: Secondary | ICD-10-CM

## 2015-03-02 DIAGNOSIS — Z713 Dietary counseling and surveillance: Secondary | ICD-10-CM | POA: Insufficient documentation

## 2015-03-02 DIAGNOSIS — R296 Repeated falls: Secondary | ICD-10-CM | POA: Diagnosis not present

## 2015-03-02 DIAGNOSIS — M6250 Muscle wasting and atrophy, not elsewhere classified, unspecified site: Secondary | ICD-10-CM | POA: Insufficient documentation

## 2015-03-02 DIAGNOSIS — R600 Localized edema: Secondary | ICD-10-CM | POA: Insufficient documentation

## 2015-03-02 DIAGNOSIS — L602 Onychogryphosis: Secondary | ICD-10-CM | POA: Insufficient documentation

## 2015-03-02 DIAGNOSIS — R634 Abnormal weight loss: Secondary | ICD-10-CM | POA: Insufficient documentation

## 2015-03-02 DIAGNOSIS — Z1211 Encounter for screening for malignant neoplasm of colon: Secondary | ICD-10-CM | POA: Insufficient documentation

## 2015-03-02 DIAGNOSIS — Z8612 Personal history of poliomyelitis: Secondary | ICD-10-CM | POA: Insufficient documentation

## 2015-03-02 NOTE — Progress Notes (Signed)
Name: [name]   MRN: [id number]    DOB: [birth year]   Date:03/02/2015       Progress Note  Subjective  Chief Complaint  Chief Complaint  Patient presents with  . Hospitalization Follow-up    patient was recently seen at [facility] for a fall that he had on Sunday (02/28/15). patient stated that the wheelchair gate fell in. he injured the top of his left eye.  Marland Kitchen Extremity Weakness    the caretaker, Vincente Liberty, reports of bilateral leg weakness    HPI  EC follow up: he was at the group home on 11/06 and moving around on his wheelchair, but the left front wheel collapsed (folded up ) and he fell forward with his chair belt still strapped on him. He hit his left forehead between the dressed and magazine rack that was on the ground. Staff not aware if he lost consciousness, but the patient called for help shortly after the staff heard a loud noise coming from his room. He was taken to [facility] and head CT negative for bleeds, laceration of left temporal area ( lateral to left eyebrow) repaired with medical glue.  He has been acting his normal self since. Normal appetite and behavior, sleeping well, using an old wheelchair.   He seems to be getting weaker on his leg, staff states it is getting harder for him to put his close on now. We will try PT  Patient Active Problem List   Diagnosis Date Noted  . Adenocarcinoma of prostate ([place]) 03/02/2015  . Amyotrophia 03/02/2015  . Edema leg 03/02/2015  . Cerebral palsy ([place]) 03/02/2015  . Dyslipidemia 03/02/2015  . Dermatitis, eczematoid 03/02/2015  . 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 Blood in the urine 03/02/2015  . H/O acute poliomyelitis 03/02/2015  . Acquired lymphedema 03/02/2015  . Imbecile 03/02/2015  . Hypertrophy of nail 03/02/2015  . Adiposity 03/02/2015  . Absence of bladder continence 03/02/2015  . Functional urinary incontinence 03/02/2015  . Chronic venous insufficiency 03/02/2015  . Avitaminosis D 03/02/2015  . Adynamia 03/02/2015  .  Decreased body weight 03/02/2015  . Dependent on wheelchair 03/02/2015  . Pneumococcal vaccination given 03/02/2015  . Encounter for screening for malignant neoplasm of prostate 03/02/2015  . Kidney cysts 03/02/2015  . Encounter for screening for other disorder 03/02/2015  . At risk for falling 03/02/2015  . Routine general medical examination at a health care facility 03/02/2015  . Encounter for general adult medical examination without abnormal findings 03/02/2015  . Dietary counseling and surveillance 03/02/2015  . Encounter for immunization 03/02/2015  . Accidental fall from bed 03/02/2015  . Encounter for screening for malignant neoplasm of colon 03/02/2015  . Cannot walk 03/02/2015    Past Surgical History  Procedure Laterality Date  . Leg circulation surgery Right     Family History  Problem Relation Age of Onset  . Heart attack Brother     Social History   Social History  . Marital Status: Widowed    Spouse Name: N/A  . Number of Children: N/A  . Years of Education: N/A   Occupational History  . Not on file.   Social History Main Topics  . Smoking status: Never Smoker   . Smokeless tobacco: Not on file  . Alcohol Use: No  . Drug Use: No  . Sexual Activity: No   Other Topics Concern  . Not on file   Social History Narrative     Current outpatient prescriptions:  .  alfuzosin (UROXATRAL) 10  MG 24 hr tablet, UROXATRAL, 10MG  (Oral Tablet Extended Release 24 Hour)  1 tablet po qd dr. Eliberto Ivory for 0 days  Quantity: 1.00;  Refills: 0   Ordered :06-October-2010  Steele Sizer MD;  Buddy Duty 18-May-2009 Active, Disp: , Rfl:  .  aspirin 81 MG chewable tablet, Chew by mouth., Disp: , Rfl:  .  augmented betamethasone dipropionate (DIPROLENE AF) 0.05 % cream, DIPROLENE AF, 0.05% (External Cream) - Historical Medication  apply to affected area on rash daily (0.05 %) Active, Disp: , Rfl:  .  Calcium Carb-Cholecalciferol (CALCIUM 500 +D) 500-400 MG-UNIT TABS, Take by mouth.,  Disp: , Rfl:   No Known Allergies   ROS  Constitutional: Negative for fever or weight change.  Respiratory: Negative for cough and shortness of breath.   Cardiovascular: Negative for chest pain or palpitations.  Gastrointestinal: Negative for abdominal pain, no bowel changes.  Musculoskeletal: Positive  for gait problem no joint swelling.  Skin: Chronic dermatitis on legs.  Neurological: Negative for dizziness or headache.  No other specific complaints in a complete review of systems (except as listed in HPI above).  Objective  Filed Vitals:   03/02/15 1508  BP: 128/72  Pulse: 70  Temp: 97.9 F (36.6 C)  TempSrc: Oral  Resp: 14  SpO2: 97%    There is no weight on file to calculate BMI.  Physical Exam  Constitutional: Patient appears well-developed and well-nourished. Obese No distress.  HEENT: head atraumatic, normocephalic, pupils equal and reactive to light, neck supple, throat within normal limits Cardiovascular: Normal rate, regular rhythm and normal heart sounds.  No murmur heard. No BLE edema. Pulmonary/Chest: Effort normal and breath sounds normal. No respiratory distress. Abdominal: Soft.  There is no tenderness. Psychiatric: Patient has a normal mood and affect. Cooperative, difficult to understand his speech.  Skin: laceration in good aspect on left temporal area, also some bruise around left eye Muscular skeletall: wheelchair bound, atrophy on both hands, no contractions on legs, but decrease in tonus  PHQ2/9: Depression screen PHQ 2/9 03/02/2015  Decreased Interest 0  Down, Depressed, Hopeless 1  PHQ - 2 Score 1    Fall Risk: Fall Risk  03/02/2015  Falls in the past year? Yes  Number falls in past yr: 2 or more  Injury with Fall? Yes  Risk for fall due to : History of fall(s);Impaired balance/gait;Impaired mobility    Functional Status Survey: Is the patient deaf or have difficulty hearing?: No Does the patient have difficulty seeing, even when  wearing glasses/contacts?: No Does the patient have difficulty concentrating, remembering, or making decisions?: Yes Does the patient have difficulty walking or climbing stairs?: Yes Does the patient have difficulty dressing or bathing?: Yes Does the patient have difficulty doing errands alone such as visiting a doctor's office or shopping?: Yes    Assessment & Plan  1. Recurrent falls  - Ambulatory referral to Physical Therapy  2. Laceration  Healing well

## 2015-04-01 ENCOUNTER — Telehealth: Payer: Self-pay | Admitting: Family Medicine

## 2015-04-01 DIAGNOSIS — R600 Localized edema: Secondary | ICD-10-CM

## 2015-04-01 NOTE — Telephone Encounter (Signed)
[name] from [name] is requesting a prescription for a [employer]. Please send to [facility] in [place]. If you have any questions please give her a call [phone]

## 2015-04-08 ENCOUNTER — Telehealth: Payer: Self-pay | Admitting: Family Medicine

## 2015-04-08 NOTE — Telephone Encounter (Signed)
[name] from [employer] states Tishawn is needing a prescription for [employer]. She is requesting that you fax it to her at [phone]. States she spoke with someone last week and they would fax it, but as of today she has not received anything. If you have any questions she can be reached at [phone] ext [phone]

## 2015-04-08 NOTE — Telephone Encounter (Signed)
[name] will fax again, she spoke with [name] last week since their regular pharmacy would not fill the prescription for his ted holes stockings.

## 2015-04-29 DIAGNOSIS — M1990 Unspecified osteoarthritis, unspecified site: Secondary | ICD-10-CM | POA: Diagnosis not present

## 2015-04-29 DIAGNOSIS — I890 Lymphedema, not elsewhere classified: Secondary | ICD-10-CM | POA: Diagnosis not present

## 2015-04-29 DIAGNOSIS — M79609 Pain in unspecified limb: Secondary | ICD-10-CM | POA: Diagnosis not present

## 2015-04-29 DIAGNOSIS — M7989 Other specified soft tissue disorders: Secondary | ICD-10-CM | POA: Diagnosis not present

## 2015-04-29 DIAGNOSIS — I872 Venous insufficiency (chronic) (peripheral): Secondary | ICD-10-CM | POA: Diagnosis not present

## 2015-05-03 DIAGNOSIS — M6281 Muscle weakness (generalized): Secondary | ICD-10-CM | POA: Diagnosis not present

## 2015-05-03 DIAGNOSIS — R262 Difficulty in walking, not elsewhere classified: Secondary | ICD-10-CM | POA: Diagnosis not present

## 2015-05-06 ENCOUNTER — Ambulatory Visit (INDEPENDENT_AMBULATORY_CARE_PROVIDER_SITE_OTHER): Payer: Medicare Other | Admitting: Urology

## 2015-05-06 ENCOUNTER — Encounter: Payer: Self-pay | Admitting: Urology

## 2015-05-06 ENCOUNTER — Other Ambulatory Visit: Payer: Self-pay

## 2015-05-06 VITALS — BP 153/81 | HR 65 | Ht 69.0 in | Wt 162.0 lb

## 2015-05-06 DIAGNOSIS — C61 Malignant neoplasm of prostate: Secondary | ICD-10-CM | POA: Diagnosis not present

## 2015-05-06 DIAGNOSIS — Z8546 Personal history of malignant neoplasm of prostate: Secondary | ICD-10-CM | POA: Diagnosis not present

## 2015-05-06 DIAGNOSIS — R350 Frequency of micturition: Secondary | ICD-10-CM | POA: Diagnosis not present

## 2015-05-06 LAB — BLADDER SCAN AMB NON-IMAGING: Scan Result: 96

## 2015-05-06 MED ORDER — CALCIUM CARB-CHOLECALCIFEROL 500-400 MG-UNIT PO TABS: 1.0000 | ORAL_TABLET | Freq: Two times a day (BID) | ORAL | Status: DC

## 2015-05-06 NOTE — Telephone Encounter (Signed)
Refill request was sent to Dr. Krichna Sowles for approval and submission.  

## 2015-05-06 NOTE — Progress Notes (Signed)
10:21 AM   [name] February 10, 1941 PH:[id number]  Referring provider: Steele Sizer, MD [address] [place], [place] [address]  Chief Complaint  Patient presents with  . Prostate Cancer    6 month follow up  . Urinary Frequency    HPI: Patient is 75 year old Caucasian mental handicap male with prostate cancer and urinary frequency who presents today for 6 month follow-up.  Prostate cancer Patient was diagnosed with PCa ( T1 (c), N0 and M0 ) Gleason score 3 + 4 adenocarcinoma involving the left side of the prostate of the prostate. Tumor markers include PSA level of 4.1 ng/mL) .  He underwent brachytherapy on 11/10/2008.  His most recent PSA was <0.1 ng/mL on 10/30/2014.    Urinary frequency Today, his staff states he is not experiencing any difficulty.  His PVR today is 96 mL.  He is on alfuzosin and wears depends.    The staff does not complain of gross hematuria, weight loss or appetite loss.    PMH: Past Medical History  Diagnosis Date  . Mental retardation   . Hematuria, gross   . BPH (benign prostatic hyperplasia)   . Lower urinary tract symptoms   . Incontinence   . Elevated PSA   . Over weight   . Urinary frequency   . Prostate cancer ([place])   . Urinary urgency     Surgical History: Past Surgical History  Procedure Laterality Date  . Prostate surgery  11/10/2008    BRACHYTHERAPY    Home Medications:    Medication List       This list is accurate as of: 05/06/15 10:21 AM.  Always use your most recent med list.               alfuzosin 10 MG 24 hr tablet  Commonly known as:  UROXATRAL  Take 1 tablet (10 mg total) by mouth daily with breakfast.     aspirin 81 MG tablet  Take 81 mg by mouth daily.     betamethasone dipropionate 0.05 % cream  Commonly known as:  DIPROLENE  Apply topically 2 (two) times daily.     Calcium-Vitamin D3 500-400 MG-UNIT Tabs  Take 500 Units by mouth 2 (two) times daily.        Allergies: No  Known Allergies  Family History: Family History  Problem Relation Age of Onset  . Heart disease      Social History:  reports that he has never smoked. He has never used smokeless tobacco. He reports that he does not drink alcohol or use illicit drugs.  ROS: Urological Symptom Review  Patient is experiencing the following symptoms: Frequent urination Leakage of urine   Review of Systems  Gastrointestinal (upper)  : Negative for upper GI symptoms  Gastrointestinal (lower) : Negative for lower GI symptoms  Constitutional : Negative for symptoms  Skin: Negative for skin symptoms  Eyes: Negative for eye symptoms  Ear/Nose/Throat : Negative for Ear/Nose/Throat symptoms  Hematologic/Lymphatic: Negative for Hematologic/Lymphatic symptoms  Cardiovascular : Negative for cardiovascular symptoms  Respiratory : Negative for respiratory symptoms  Endocrine: Negative for endocrine symptoms  Musculoskeletal: Negative for musculoskeletal symptoms  Neurological: Negative for neurological symptoms  Psychologic: Negative for psychiatric symptoms   Physical Exam: BP 153/81 mmHg  Pulse 65  Ht 5\' 9"  (1.753 m)  Wt 162 lb (73.483 kg)  BMI 23.91 kg/m2  GU: No CVA tenderness.  No bladder fullness or masses.  Patient with uncircumcised phallus. Foreskin easily retracted  Urethral meatus is patent.  No penile discharge. No penile lesions or rashes. Scrotum without lesions, cysts, rashes and/or edema.  Testicles are located scrotally bilaterally. No masses are appreciated in the testicles. Left and right epididymis are normal. Rectal: Patient with  normal sphincter tone. Anus and perineum without scarring or rashes. No rectal masses are appreciated. Prostate is approximately 50 grams, flat,  no nodules are appreciated. Seminal vesicles are normal.  Laboratory Data: Results for orders placed or performed in visit on 05/06/15  BLADDER SCAN AMB NON-IMAGING  Result Value Ref  Range   Scan Result 96    Lab Results  Component Value Date   WBC 4.8 02/11/2015   HGB 14.1 08/14/2013   HCT 41.8 02/11/2015   MCV 93 02/11/2015   PLT 168 02/11/2015    Lab Results  Component Value Date   CREATININE 0.72* 02/11/2015   PSA History  <0.1 ng/mL on 10/30/2014   Lab Results  Component Value Date   HGBA1C 4.9 02/11/2015     Assessment & Plan:    1. Prostate cancer:   Patient was diagnosed with a ( T1 (c), N0 and M0 ) Gleason score 3 + 4 adenocarcinoma involving the left side of the prostate of the prostate. Tumor markers include PSA level of 4.1 ng/mL.  Current diagnosis was determined by transrectal ultrasound and needle biopsy of the prostate (completed on 08/27/2008). Treatment has included brachytherapy (completed on 11/10/2008).  His last PSA was <0.1 ng/mL on 10/30/2014.  He will RTC in one year for exam and PSA.    - PSA  2. Urinary frequency:   Patient's staff states his urinary symptoms are well controlled on alfuzosin.  His PVR is 96 mL.  He will continue the alfuzosin.  Refills are not needed at this time.  He will follow up in 6 months for a PVR.    - BLADDER SCAN AMB NON-IMAGING   Return in about 6 months (around 11/03/2015) for exam and PVR.  Zara Council, [facility] [address], [place], [place] [address] [phone]

## 2015-05-07 ENCOUNTER — Telehealth: Payer: Self-pay

## 2015-05-07 LAB — PSA

## 2015-05-07 NOTE — Telephone Encounter (Signed)
Spoke with pt caregiver in reference to PSA results and testing in 1 year. Caregiver voiced understanding.

## 2015-05-07 NOTE — Telephone Encounter (Signed)
-----   Message from Nori Riis, PA-C sent at 05/07/2015  8:48 AM EST ----- Patient's PSA remains undetectable.  We can actually see him on a yearly basis instead of six months.  He is greater than five years out from his prostate cancer treatments.

## 2015-05-08 ENCOUNTER — Encounter: Payer: Self-pay | Admitting: Urology

## 2015-05-12 DIAGNOSIS — R262 Difficulty in walking, not elsewhere classified: Secondary | ICD-10-CM | POA: Diagnosis not present

## 2015-05-12 DIAGNOSIS — M6281 Muscle weakness (generalized): Secondary | ICD-10-CM | POA: Diagnosis not present

## 2015-05-17 DIAGNOSIS — R262 Difficulty in walking, not elsewhere classified: Secondary | ICD-10-CM | POA: Diagnosis not present

## 2015-05-17 DIAGNOSIS — M6281 Muscle weakness (generalized): Secondary | ICD-10-CM | POA: Diagnosis not present

## 2015-05-18 ENCOUNTER — Other Ambulatory Visit: Payer: Self-pay

## 2015-05-18 NOTE — Telephone Encounter (Signed)
Got [facility] requesting a refill of this patient's Calcium 500 + D 400iu, but this has already been filled on 05/06/15 by Dr. Steele Sizer.

## 2015-05-26 DIAGNOSIS — R262 Difficulty in walking, not elsewhere classified: Secondary | ICD-10-CM | POA: Diagnosis not present

## 2015-05-26 DIAGNOSIS — M6281 Muscle weakness (generalized): Secondary | ICD-10-CM | POA: Diagnosis not present

## 2015-05-31 DIAGNOSIS — M6281 Muscle weakness (generalized): Secondary | ICD-10-CM | POA: Diagnosis not present

## 2015-05-31 DIAGNOSIS — R262 Difficulty in walking, not elsewhere classified: Secondary | ICD-10-CM | POA: Diagnosis not present

## 2015-06-07 DIAGNOSIS — M6281 Muscle weakness (generalized): Secondary | ICD-10-CM | POA: Diagnosis not present

## 2015-06-07 DIAGNOSIS — R262 Difficulty in walking, not elsewhere classified: Secondary | ICD-10-CM | POA: Diagnosis not present

## 2015-06-09 ENCOUNTER — Other Ambulatory Visit: Payer: Self-pay

## 2015-06-09 MED ORDER — ASPIRIN 81 MG PO TABS: 81.0000 mg | ORAL_TABLET | Freq: Every day | ORAL | Status: DC

## 2015-06-09 NOTE — Telephone Encounter (Signed)
Patient requesting refill. 

## 2015-06-14 DIAGNOSIS — R262 Difficulty in walking, not elsewhere classified: Secondary | ICD-10-CM | POA: Diagnosis not present

## 2015-06-14 DIAGNOSIS — M6281 Muscle weakness (generalized): Secondary | ICD-10-CM | POA: Diagnosis not present

## 2015-06-16 ENCOUNTER — Other Ambulatory Visit: Payer: Self-pay

## 2015-06-16 NOTE — Telephone Encounter (Signed)
Got a fax from [facility] requesting a refill of this patient's aspirin 81mg  but it was already sent in on 06/09/15.

## 2015-06-21 DIAGNOSIS — M6281 Muscle weakness (generalized): Secondary | ICD-10-CM | POA: Diagnosis not present

## 2015-06-21 DIAGNOSIS — R262 Difficulty in walking, not elsewhere classified: Secondary | ICD-10-CM | POA: Diagnosis not present

## 2015-06-22 ENCOUNTER — Other Ambulatory Visit: Payer: Self-pay

## 2015-06-22 MED ORDER — ASPIRIN 81 MG PO TABS: 81.0000 mg | ORAL_TABLET | Freq: Every day | ORAL | Status: DC

## 2015-06-22 NOTE — Telephone Encounter (Signed)
Patient requesting refill. 

## 2015-06-28 DIAGNOSIS — M6281 Muscle weakness (generalized): Secondary | ICD-10-CM | POA: Diagnosis not present

## 2015-06-28 DIAGNOSIS — R262 Difficulty in walking, not elsewhere classified: Secondary | ICD-10-CM | POA: Diagnosis not present

## 2015-07-05 DIAGNOSIS — R262 Difficulty in walking, not elsewhere classified: Secondary | ICD-10-CM | POA: Diagnosis not present

## 2015-07-05 DIAGNOSIS — M6281 Muscle weakness (generalized): Secondary | ICD-10-CM | POA: Diagnosis not present

## 2015-07-08 ENCOUNTER — Encounter: Payer: Self-pay | Admitting: Family Medicine

## 2015-07-08 ENCOUNTER — Ambulatory Visit (INDEPENDENT_AMBULATORY_CARE_PROVIDER_SITE_OTHER): Payer: Medicare Other | Admitting: Family Medicine

## 2015-07-08 VITALS — BP 134/78 | HR 71 | Temp 98.5°F | Resp 16 | Ht 66.0 in | Wt 162.0 lb

## 2015-07-08 DIAGNOSIS — R030 Elevated blood-pressure reading, without diagnosis of hypertension: Secondary | ICD-10-CM

## 2015-07-08 DIAGNOSIS — IMO0001 Reserved for inherently not codable concepts without codable children: Secondary | ICD-10-CM

## 2015-07-08 DIAGNOSIS — F4321 Adjustment disorder with depressed mood: Secondary | ICD-10-CM | POA: Diagnosis not present

## 2015-07-08 MED ORDER — CLONIDINE HCL 0.1 MG PO TABS: 0.1000 mg | ORAL_TABLET | Freq: Three times a day (TID) | ORAL | Status: DC

## 2015-07-08 MED ORDER — ALPRAZOLAM 0.25 MG PO TABS: 0.2500 mg | ORAL_TABLET | Freq: Two times a day (BID) | ORAL | Status: DC | PRN

## 2015-07-08 NOTE — Progress Notes (Signed)
Name: [name]   MRN: [id number]    DOB: Apr 24, 1941   Date:07/08/2015       Progress Note  Subjective  Chief Complaint  Chief Complaint  Patient presents with  . Hypertension    The past 2 days he felt bad and has had elevated BP-reading of 168/119 and 136/106, having headaches, edema in right ankle-wearing compression hoses, has been elevated his feet as well  . Headache    Took Tylenol and has had some headaches-with some relief    HPI  Elevated BP: there is a lot of changes going on his life. Over the past Couple of months he has been stressed. His roommate died recently.  Sister has been very sick and is now in a nursing home. Over the past week they moved to a new workshop. He has been upset. No crying spells. No change in appetite. He had a group grieving session but not individual session. BP was elevated last night because he had a mild headache. This morning BP was 168/119 but went down to 136/106  -bp was checked after he was fussing with his roommate.    Patient Active Problem List   Diagnosis Date Noted  . Adenocarcinoma of prostate ([place]) 03/02/2015  . Amyotrophia 03/02/2015  . Edema leg 03/02/2015  . Cerebral palsy ([place]) 03/02/2015  . Dyslipidemia 03/02/2015  . Dermatitis, eczematoid 03/02/2015  . 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 H/O acute poliomyelitis 03/02/2015  . Acquired lymphedema 03/02/2015  . Mental retardation 03/02/2015  . Hypertrophy of nail 03/02/2015  . Adiposity 03/02/2015  . Absence of bladder continence 03/02/2015  . Chronic venous insufficiency 03/02/2015  . Avitaminosis D 03/02/2015  . Adynamia 03/02/2015  . Dependent on wheelchair 03/02/2015  . Kidney cysts 03/02/2015  . At risk for falling 03/02/2015  . Cannot walk 03/02/2015  . Cerebral vascular disease 03/02/2015  . Mental retardation 02/11/2015  . Strabismus 02/11/2015  . Urge incontinence of urine 02/11/2015  . Wheelchair dependence 02/11/2015  . Eczema 02/11/2015  . Obesity  02/11/2015  . Prostate cancer ([place]) 10/30/2014  . Urinary frequency 10/30/2014    Past Surgical History  Procedure Laterality Date  . Leg circulation surgery Right   . Prostate surgery  11/10/2008    BRACHYTHERAPY    Family History  Problem Relation Age of Onset  . Heart attack Brother   . Heart disease      Social History   Social History  . Marital Status: Single    Spouse Name: N/A  . Number of Children: N/A  . Years of Education: N/A   Occupational History  . Not on file.   Social History Main Topics  . Smoking status: Never Smoker   . Smokeless tobacco: Never Used  . Alcohol Use: No  . Drug Use: No  . Sexual Activity: No   Other Topics Concern  . Not on file   Social History Narrative   ** Merged History Encounter **         Current outpatient prescriptions:  .  alfuzosin (UROXATRAL) 10 MG 24 hr tablet, Take 1 tablet (10 mg total) by mouth daily with breakfast., Disp: 90 tablet, Rfl: 3 .  aspirin 81 MG tablet, Take 1 tablet (81 mg total) by mouth daily., Disp: 30 tablet, Rfl: 12 .  augmented betamethasone dipropionate (DIPROLENE AF) 0.05 % cream, DIPROLENE AF, 0.05% (External Cream) - Historical Medication  apply to affected area on rash daily (0.05 %) Active, Disp: , Rfl:  .  betamethasone dipropionate (DIPROLENE) 0.05 % cream, Apply topically 2 (two) times daily., Disp: 90 g, Rfl: 1 .  Calcium Carb-Cholecalciferol (CALCIUM-VITAMIN D3) 500-400 MG-UNIT TABS, Take 500 Units by mouth 2 (two) times daily. , Disp: , Rfl:  .  ALPRAZolam (XANAX) 0.25 MG tablet, Take 1 tablet (0.25 mg total) by mouth 2 (two) times daily as needed for anxiety., Disp: 20 tablet, Rfl: 0 .  cloNIDine (CATAPRES) 0.1 MG tablet, Take 1 tablet (0.1 mg total) by mouth 3 (three) times daily., Disp: 30 tablet, Rfl: 0  No Known Allergies   ROS  Ten systems reviewed and is negative except as mentioned in HPI He states headache resolved after Tylenol dose  Objective  Filed Vitals:    07/08/15 0946  BP: 134/78  Pulse: 71  Temp: 98.5 F (36.9 C)  TempSrc: Oral  Resp: 16  Height: 5\' 6"  (1.676 m)  Weight: 162 lb (73.483 kg)  SpO2: 98%    Body mass index is 26.16 kg/(m^2).  Physical Exam   Constitutional: Patient appears well-developed . Obese No distress.  HEENT: head atraumatic, normocephalic, pupils equal and reactive to light, he has strabismus, neck supple, throat within normal limits Cardiovascular: Normal rate, regular rhythm and normal heart sounds. No murmur heard. Trace bilateral ankle edema. Pulmonary/Chest: Effort normal and breath sounds normal. No respiratory distress. Abdominal: Soft. There is no tenderness. Psychiatric: Patient has a normal mood and affect. Cooperative, difficult to understand his speech.    Recent Results (from the past 2160 hour(s))  PSA     Status: None   Collection Time: 05/06/15  9:50 AM  Result Value Ref Range   Prostate Specific Ag, Serum <0.1 0.0 - 4.0 ng/mL    Comment: Roche ECLIA methodology. According to the American Urological Association, Serum PSA should decrease and remain at undetectable levels after radical prostatectomy. The AUA defines biochemical recurrence as an initial PSA value 0.2 ng/mL or greater followed by a subsequent confirmatory PSA value 0.2 ng/mL or greater. Values obtained with different assay methods or kits cannot be used interchangeably. Results cannot be interpreted as absolute evidence of the presence or absence of malignant disease.   BLADDER SCAN AMB NON-IMAGING     Status: None   Collection Time: 05/06/15 10:07 AM  Result Value Ref Range   Scan Result 96     PHQ2/9: Depression screen [facility] 2/9 03/02/2015 02/11/2015  Decreased Interest 0 0  Down, Depressed, Hopeless 1 0  PHQ - 2 Score 1 0     Fall Risk: Fall Risk  03/02/2015 02/11/2015  Falls in the past year? Yes Yes  Number falls in past yr: 2 or more 1  Injury with Fall? Yes No  Risk for fall due to : History of  fall(s);Impaired balance/gait;Impaired mobility -    Assessment & Plan  1. Elevated blood pressure  - ALPRAZolam (XANAX) 0.25 MG tablet; Take 1 tablet (0.25 mg total) by mouth 2 (two) times daily as needed for anxiety.  Dispense: 20 tablet; Refill: 0 - cloNIDine (CATAPRES) 0.1 MG tablet; Take 1 tablet (0.1 mg total) by mouth 3 (three) times daily.  Dispense: 30 tablet; Refill: 0  2. Grieving  - ALPRAZolam (XANAX) 0.25 MG tablet; Take 1 tablet (0.25 mg total) by mouth 2 (two) times daily as needed for anxiety.  Dispense: 20 tablet; Refill: 0 -discussed individual counselor.

## 2015-07-12 DIAGNOSIS — M6281 Muscle weakness (generalized): Secondary | ICD-10-CM | POA: Diagnosis not present

## 2015-07-12 DIAGNOSIS — R262 Difficulty in walking, not elsewhere classified: Secondary | ICD-10-CM | POA: Diagnosis not present

## 2015-07-19 ENCOUNTER — Ambulatory Visit (INDEPENDENT_AMBULATORY_CARE_PROVIDER_SITE_OTHER): Payer: Medicare Other | Admitting: Family Medicine

## 2015-07-19 ENCOUNTER — Encounter: Payer: Self-pay | Admitting: Family Medicine

## 2015-07-19 VITALS — BP 132/76 | HR 76 | Temp 98.0°F | Resp 14

## 2015-07-19 DIAGNOSIS — R293 Abnormal posture: Secondary | ICD-10-CM | POA: Diagnosis not present

## 2015-07-19 DIAGNOSIS — R600 Localized edema: Secondary | ICD-10-CM | POA: Diagnosis not present

## 2015-07-19 DIAGNOSIS — Z993 Dependence on wheelchair: Secondary | ICD-10-CM

## 2015-07-19 DIAGNOSIS — R262 Difficulty in walking, not elsewhere classified: Secondary | ICD-10-CM | POA: Diagnosis not present

## 2015-07-19 DIAGNOSIS — L309 Dermatitis, unspecified: Secondary | ICD-10-CM | POA: Diagnosis not present

## 2015-07-19 DIAGNOSIS — M6281 Muscle weakness (generalized): Secondary | ICD-10-CM | POA: Diagnosis not present

## 2015-07-19 MED ORDER — MEDICAL COMPRESSION STOCKINGS MISC: 1.0000 [IU] | Freq: Every day | Status: DC

## 2015-07-19 NOTE — Progress Notes (Signed)
Name: [name]   MRN: [id number]    DOB: 13-Apr-1941   Date:07/19/2015       Progress Note  Subjective  Chief Complaint  Chief Complaint  Patient presents with  . Foot Swelling    right foot swollen for about 2 weeks. no known injury. no heat. the caretaker stated that others have noticed that he has been leaning to the left side.  . Rash    HPI  Edema legs: he has been wearing compression stocking hoses but still has some swelling on left leg, it does not seem to be painful. Right ankle more swollen than left.   Eczema: caregiver has noticed some red, dry patches on his upper back. He has a history of eczema and they have not been applying on his back lately.   Posture change/wheelchair dependency: caregivers have noticed that he seems be leaning to the left side of his wheelchair, also not assisting as much while bathing and trying to get in the [place] over the past few days..  He has PT, but has not seen physical therapist since change.   Elevated bp: doing well, only had to use one dose of Alprazolam.   Patient Active Problem List   Diagnosis Date Noted  . Adenocarcinoma of prostate ([place]) 03/02/2015  . Amyotrophia 03/02/2015  . Edema leg 03/02/2015  . Cerebral palsy ([place]) 03/02/2015  . Dyslipidemia 03/02/2015  . Dermatitis, eczematoid 03/02/2015  . 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 H/O acute poliomyelitis 03/02/2015  . Acquired lymphedema 03/02/2015  . Mental retardation 03/02/2015  . Hypertrophy of nail 03/02/2015  . Adiposity 03/02/2015  . Absence of bladder continence 03/02/2015  . Chronic venous insufficiency 03/02/2015  . Avitaminosis D 03/02/2015  . Adynamia 03/02/2015  . Dependent on wheelchair 03/02/2015  . Kidney cysts 03/02/2015  . At risk for falling 03/02/2015  . Cerebral vascular disease 03/02/2015  . Strabismus 02/11/2015  . Urge incontinence of urine 02/11/2015  . Wheelchair dependence 02/11/2015  . Obesity 02/11/2015  . Prostate cancer ([place])  10/30/2014  . Urinary frequency 10/30/2014    Past Surgical History  Procedure Laterality Date  . Leg circulation surgery Right   . Prostate surgery  11/10/2008    BRACHYTHERAPY    Family History  Problem Relation Age of Onset  . Heart attack Brother   . Heart disease      Social History   Social History  . Marital Status: Single    Spouse Name: N/A  . Number of Children: N/A  . Years of Education: N/A   Occupational History  . Not on file.   Social History Main Topics  . Smoking status: Never Smoker   . Smokeless tobacco: Never Used  . Alcohol Use: No  . Drug Use: No  . Sexual Activity: No   Other Topics Concern  . Not on file   Social History Narrative   ** Merged History Encounter **         Current outpatient prescriptions:  .  alfuzosin (UROXATRAL) 10 MG 24 hr tablet, Take 1 tablet (10 mg total) by mouth daily with breakfast., Disp: 90 tablet, Rfl: 3 .  ALPRAZolam (XANAX) 0.25 MG tablet, Take 1 tablet (0.25 mg total) by mouth 2 (two) times daily as needed for anxiety., Disp: 20 tablet, Rfl: 0 .  aspirin 81 MG tablet, Take 1 tablet (81 mg total) by mouth daily., Disp: 30 tablet, Rfl: 12 .  augmented betamethasone dipropionate (DIPROLENE AF) 0.05 % cream, DIPROLENE AF, 0.05% (  External Cream) - Historical Medication  apply to affected area on rash daily (0.05 %) Active, Disp: , Rfl:  .  betamethasone dipropionate (DIPROLENE) 0.05 % cream, Apply topically 2 (two) times daily., Disp: 90 g, Rfl: 1 .  Calcium Carb-Cholecalciferol (CALCIUM-VITAMIN D3) 500-400 MG-UNIT TABS, Take 500 Units by mouth 2 (two) times daily. , Disp: , Rfl:  .  cloNIDine (CATAPRES) 0.1 MG tablet, Take 1 tablet (0.1 mg total) by mouth 3 (three) times daily., Disp: 30 tablet, Rfl: 0  No Known Allergies   ROS  Ten systems reviewed and is negative except as mentioned in HPI   Objective  Filed Vitals:   07/19/15 1602  BP: 132/76  Pulse: 76  Temp: 98 F (36.7 C)  TempSrc: Oral   Resp: 14  SpO2: 96%    There is no weight on file to calculate BMI.  Physical Exam  Constitutional: Patient appears well-developed and well-nourished. Obese  No distress.  HEENT: head atraumatic, normocephalic, pupils equal and reactive to light, neck supple, throat within normal limits Cardiovascular: Normal rate, regular rhythm and normal heart sounds.  No murmur heard. 1 plus pitting ankle  BLE edema. No calf tenderness Pulmonary/Chest: Effort normal and breath sounds normal. No respiratory distress. Abdominal: Soft.  There is no tenderness. Psychiatric: Patient has a normal mood and affect. behavior is normal.  Skin: eczematous patches on his upper back.   Recent Results (from the past 2160 hour(s))  PSA     Status: None   Collection Time: 05/06/15  9:50 AM  Result Value Ref Range   Prostate Specific Ag, Serum <0.1 0.0 - 4.0 ng/mL    Comment: Roche ECLIA methodology. According to the American Urological Association, Serum PSA should decrease and remain at undetectable levels after radical prostatectomy. The AUA defines biochemical recurrence as an initial PSA value 0.2 ng/mL or greater followed by a subsequent confirmatory PSA value 0.2 ng/mL or greater. Values obtained with different assay methods or kits cannot be used interchangeably. Results cannot be interpreted as absolute evidence of the presence or absence of malignant disease.   BLADDER SCAN AMB NON-IMAGING     Status: None   Collection Time: 05/06/15 10:07 AM  Result Value Ref Range   Scan Result 96       PHQ2/9: Depression screen [facility] 2/9 07/19/2015 03/02/2015 02/11/2015  Decreased Interest 0 0 0  Down, Depressed, Hopeless 0 1 0  PHQ - 2 Score 0 1 0    Fall Risk: Fall Risk  07/19/2015 03/02/2015 02/11/2015  Falls in the past year? No Yes Yes  Number falls in past yr: - 2 or more 1  Injury with Fall? - Yes No  Risk for fall due to : - History of fall(s);Impaired balance/gait;Impaired mobility -       Functional Status Survey: Is the patient deaf or have difficulty hearing?: No Does the patient have difficulty seeing, even when wearing glasses/contacts?: No Does the patient have difficulty concentrating, remembering, or making decisions?: No Does the patient have difficulty walking or climbing stairs?: No Does the patient have difficulty dressing or bathing?: No Does the patient have difficulty doing errands alone such as visiting a doctor's office or shopping?: No    Assessment & Plan  1. Dermatitis, eczematoid  Resume topica medication   2. Dependent on wheelchair  Continue wheelchair use  3. Bilateral edema of lower extremity  - Elastic Bandages & Supports (MEDICAL COMPRESSION STOCKINGS) MISC; 1 Units by Does not apply route daily.  Dispense:  2 each; Refill: 0  4. Posture abnormality  Needs to see PT today, if change in his exam we can order CT brain and carotid doppler, it may just be habit since he can go back to neutral position, and grip is symmetrical

## 2015-07-26 DIAGNOSIS — R262 Difficulty in walking, not elsewhere classified: Secondary | ICD-10-CM | POA: Diagnosis not present

## 2015-07-26 DIAGNOSIS — M6281 Muscle weakness (generalized): Secondary | ICD-10-CM | POA: Diagnosis not present

## 2015-08-02 DIAGNOSIS — R262 Difficulty in walking, not elsewhere classified: Secondary | ICD-10-CM | POA: Diagnosis not present

## 2015-08-02 DIAGNOSIS — M6281 Muscle weakness (generalized): Secondary | ICD-10-CM | POA: Diagnosis not present

## 2015-08-06 ENCOUNTER — Other Ambulatory Visit: Payer: Self-pay

## 2015-08-06 DIAGNOSIS — F4321 Adjustment disorder with depressed mood: Secondary | ICD-10-CM

## 2015-08-06 DIAGNOSIS — IMO0001 Reserved for inherently not codable concepts without codable children: Secondary | ICD-10-CM

## 2015-08-06 DIAGNOSIS — R030 Elevated blood-pressure reading, without diagnosis of hypertension: Principal | ICD-10-CM

## 2015-08-06 MED ORDER — ALPRAZOLAM 0.25 MG PO TABS: 0.2500 mg | ORAL_TABLET | Freq: Two times a day (BID) | ORAL | Status: DC | PRN

## 2015-08-06 MED ORDER — IBUPROFEN 800 MG PO TABS: 800.0000 mg | ORAL_TABLET | Freq: Three times a day (TID) | ORAL | Status: DC | PRN

## 2015-08-06 MED ORDER — CLONIDINE HCL 0.1 MG PO TABS: 0.1000 mg | ORAL_TABLET | Freq: Three times a day (TID) | ORAL | Status: DC

## 2015-08-06 NOTE — Telephone Encounter (Signed)
Got a fax from [facility] requesting a refill of this patient's Ibuprofen 800mg , Clonidine 0.1, Xanax 0.25.  Refill request was sent to Dr. Steele Sizer for approval and submission.

## 2015-08-09 DIAGNOSIS — R262 Difficulty in walking, not elsewhere classified: Secondary | ICD-10-CM | POA: Diagnosis not present

## 2015-08-09 DIAGNOSIS — M6281 Muscle weakness (generalized): Secondary | ICD-10-CM | POA: Diagnosis not present

## 2015-08-11 ENCOUNTER — Other Ambulatory Visit: Payer: Self-pay

## 2015-08-11 DIAGNOSIS — R350 Frequency of micturition: Secondary | ICD-10-CM

## 2015-08-11 MED ORDER — ALFUZOSIN HCL ER 10 MG PO TB24: 10.0000 mg | ORAL_TABLET | Freq: Every day | ORAL | Status: DC

## 2015-08-13 ENCOUNTER — Ambulatory Visit: Payer: Medicare Other | Admitting: Family Medicine

## 2015-08-17 DIAGNOSIS — K1370 Unspecified lesions of oral mucosa: Secondary | ICD-10-CM | POA: Diagnosis not present

## 2015-08-18 ENCOUNTER — Telehealth: Payer: Self-pay | Admitting: Family Medicine

## 2015-08-18 NOTE — Telephone Encounter (Signed)
I need a diagnosis

## 2015-08-18 NOTE — Telephone Encounter (Signed)
[facility] orginally referred patient to [facility] with Dr Pryor Ochoa but due to insurance patient is needing referral to be sent by his primary doctor

## 2015-08-19 ENCOUNTER — Encounter: Payer: Self-pay | Admitting: Family Medicine

## 2015-08-19 ENCOUNTER — Ambulatory Visit (INDEPENDENT_AMBULATORY_CARE_PROVIDER_SITE_OTHER): Payer: Medicare Other | Admitting: Family Medicine

## 2015-08-19 VITALS — BP 110/80 | HR 71 | Temp 98.1°F | Resp 18

## 2015-08-19 DIAGNOSIS — R030 Elevated blood-pressure reading, without diagnosis of hypertension: Secondary | ICD-10-CM

## 2015-08-19 DIAGNOSIS — R2681 Unsteadiness on feet: Secondary | ICD-10-CM | POA: Diagnosis not present

## 2015-08-19 DIAGNOSIS — E559 Vitamin D deficiency, unspecified: Secondary | ICD-10-CM

## 2015-08-19 DIAGNOSIS — J069 Acute upper respiratory infection, unspecified: Secondary | ICD-10-CM

## 2015-08-19 DIAGNOSIS — R5383 Other fatigue: Secondary | ICD-10-CM | POA: Diagnosis not present

## 2015-08-19 DIAGNOSIS — K148 Other diseases of tongue: Secondary | ICD-10-CM

## 2015-08-19 DIAGNOSIS — IMO0001 Reserved for inherently not codable concepts without codable children: Secondary | ICD-10-CM

## 2015-08-19 NOTE — Progress Notes (Signed)
Name: [name]   MRN: [id number]    DOB: 11-13-1940   Date:08/19/2015       Progress Note  Subjective  Chief Complaint  Chief Complaint  Patient presents with  . Tongue lesion    patient presents with a lesion on his tongue that the home attendents noticed on Monday.  Marland Kitchen URI    HPI  Tongue Lesion: he was at [facility] four days ago, and one of the staff noticed a lesion on his tongue. Caregivers that brought him in today have not noticed lesion prior to that. He has not complained of pain or discomfort  URI: he has developed a dry cough, and has nasal congestion, no fever, no chills no change in appetite. Symptoms started about 5 days ago.   Elevated bp: his bp at the group home has been around 130's/80's, occasionally higher, we will hold off on medication at this time, he has prn medication. Alprazolam and Clonidine.   Edema Leg: wearing compression stocking hoses and is doing well now.   Grieving/Fatigue: lost sister one month ago. They were close. She spoke to the hospice grieve counselor. He has not been crying, seems like behavior has not changed.   Fatigue: and some weakness - that has been gradual, seeing PT, we had multiple labs done in October and normal, we will check vitamin D   Patient Active Problem List   Diagnosis Date Noted  . Adenocarcinoma of prostate ([place]) 03/02/2015  . Amyotrophia 03/02/2015  . Edema leg 03/02/2015  . Cerebral palsy ([place]) 03/02/2015  . Dyslipidemia 03/02/2015  . Dermatitis, eczematoid 03/02/2015  . 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 H/O acute poliomyelitis 03/02/2015  . Acquired lymphedema 03/02/2015  . Mental retardation 03/02/2015  . Hypertrophy of nail 03/02/2015  . Adiposity 03/02/2015  . Absence of bladder continence 03/02/2015  . Chronic venous insufficiency 03/02/2015  . Avitaminosis D 03/02/2015  . Adynamia 03/02/2015  . Dependent on wheelchair 03/02/2015  . Kidney cysts 03/02/2015  . At risk for falling  03/02/2015  . Cerebral vascular disease 03/02/2015  . Strabismus 02/11/2015  . Urge incontinence of urine 02/11/2015  . Wheelchair dependence 02/11/2015  . Obesity 02/11/2015  . Prostate cancer ([place]) 10/30/2014  . Urinary frequency 10/30/2014    Past Surgical History  Procedure Laterality Date  . Leg circulation surgery Right   . Prostate surgery  11/10/2008    BRACHYTHERAPY    Family History  Problem Relation Age of Onset  . Heart attack Brother   . Heart disease      Social History   Social History  . Marital Status: Single    Spouse Name: N/A  . Number of Children: N/A  . Years of Education: N/A   Occupational History  . Not on file.   Social History Main Topics  . Smoking status: Never Smoker   . Smokeless tobacco: Never Used  . Alcohol Use: No  . Drug Use: No  . Sexual Activity: No   Other Topics Concern  . Not on file   Social History Narrative   ** Merged History Encounter **         Current outpatient prescriptions:  .  alfuzosin (UROXATRAL) 10 MG 24 hr tablet, Take 1 tablet (10 mg total) by mouth daily with breakfast., Disp: 90 tablet, Rfl: 3 .  ALPRAZolam (XANAX) 0.25 MG tablet, Take 1 tablet (0.25 mg total) by mouth 2 (two) times daily as needed for anxiety., Disp: 20 tablet, Rfl: 0 .  aspirin 81 MG tablet, Take 1 tablet (81 mg total) by mouth daily., Disp: 30 tablet, Rfl: 12 .  augmented betamethasone dipropionate (DIPROLENE AF) 0.05 % cream, DIPROLENE AF, 0.05% (External Cream) - Historical Medication  apply to affected area on rash daily (0.05 %) Active, Disp: , Rfl:  .  betamethasone dipropionate (DIPROLENE) 0.05 % cream, Apply topically 2 (two) times daily., Disp: 90 g, Rfl: 1 .  Calcium Carb-Cholecalciferol (CALCIUM-VITAMIN D3) 500-400 MG-UNIT TABS, Take 500 Units by mouth 2 (two) times daily. , Disp: , Rfl:  .  cloNIDine (CATAPRES) 0.1 MG tablet, Take 1 tablet (0.1 mg total) by mouth 3 (three) times daily., Disp: 30 tablet, Rfl: 0 .  Elastic  Bandages & Supports (MEDICAL COMPRESSION STOCKINGS) MISC, 1 Units by Does not apply route daily., Disp: 2 each, Rfl: 0 .  ibuprofen (ADVIL,MOTRIN) 800 MG tablet, Take 1 tablet (800 mg total) by mouth every 8 (eight) hours as needed., Disp: 30 tablet, Rfl: 0  No Known Allergies   ROS  Ten systems reviewed and is negative except as mentioned in HPI   Objective  Filed Vitals:   08/19/15 1355  BP: 110/80  Pulse: 71  Temp: 98.1 F (36.7 C)  TempSrc: Oral  Resp: 18  SpO2: 97%    There is no weight on file to calculate BMI.  Physical Exam  Constitutional: Patient appears well-developed and well-nourished. Obese  No distress. Sitting on the wheelchair HEENT: head atraumatic, normocephalic, pupils equal and reactive to light, , neck supple, throat within normal limits, tongue lesion on left anterior portion, non - tender - violaceous in color Cardiovascular: Normal rate, regular rhythm and normal heart sounds.  No murmur heard. 1 plus BLE edema. Pulmonary/Chest: Effort normal and breath sounds normal. No respiratory distress. Abdominal: Soft.  There is no tenderness. Psychiatric: Patient has a normal mood and affect.   PHQ2/9: Depression screen [facility] 2/9 08/19/2015 07/19/2015 03/02/2015 02/11/2015  Decreased Interest 0 0 0 0  Down, Depressed, Hopeless 1 0 1 0  PHQ - 2 Score 1 0 1 0     Fall Risk: Fall Risk  08/19/2015 07/19/2015 03/02/2015 02/11/2015  Falls in the past year? No No Yes Yes  Number falls in past yr: - - 2 or more 1  Injury with Fall? - - Yes No  Risk for fall due to : - - History of fall(s);Impaired balance/gait;Impaired mobility -    Functional Status Survey: Is the patient deaf or have difficulty hearing?: No Does the patient have difficulty seeing, even when wearing glasses/contacts?: No Does the patient have difficulty concentrating, remembering, or making decisions?: No Does the patient have difficulty walking or climbing stairs?: No Does the patient have  difficulty dressing or bathing?: No Does the patient have difficulty doing errands alone such as visiting a doctor's office or shopping?: No    Assessment & Plan  1. Tongue lesion  - Ambulatory referral to ENT  2. Elevated blood pressure  Continue checking at home  3. Gait instability  Continue PT  4. Other fatigue  On going , and previous lab negative, advised to continue PT, check vitamin D, monitor for signs of depression such as change in appetite, change in behavior, withdrawal from social activities  5. Vitamin D deficiency  - VITAMIN D 25 Hydroxy (Vit-D Deficiency, Fractures)   6. Upper respiratory infection  Likely viral illness, normal exam, monitor for fever or SOB and return if needed for CXR

## 2015-08-19 NOTE — Patient Instructions (Signed)
monitor for signs of depression such as change in appetite, change in behavior, withdrawal from social activities

## 2015-08-19 NOTE — Telephone Encounter (Signed)
Patient is coming in today this afternoon to be seen and we can request a form to be filled to receive the documents from [facility] while patient is in office.

## 2015-08-20 LAB — VITAMIN D 25 HYDROXY (VIT D DEFICIENCY, FRACTURES): Vit D, 25-Hydroxy: 36 ng/mL (ref 30.0–100.0)

## 2015-08-21 ENCOUNTER — Encounter: Payer: Self-pay | Admitting: Emergency Medicine

## 2015-08-21 ENCOUNTER — Emergency Department
Admission: EM | Admit: 2015-08-21 | Discharge: 2015-08-21 | Disposition: A | Discharge status: Home / Self Care | Payer: Medicare Other | Attending: Emergency Medicine | Admitting: Emergency Medicine

## 2015-08-21 DIAGNOSIS — N390 Urinary tract infection, site not specified: Secondary | ICD-10-CM | POA: Diagnosis not present

## 2015-08-21 DIAGNOSIS — R319 Hematuria, unspecified: Secondary | ICD-10-CM

## 2015-08-21 DIAGNOSIS — Z8546 Personal history of malignant neoplasm of prostate: Secondary | ICD-10-CM | POA: Insufficient documentation

## 2015-08-21 DIAGNOSIS — E785 Hyperlipidemia, unspecified: Secondary | ICD-10-CM | POA: Diagnosis not present

## 2015-08-21 LAB — URINALYSIS COMPLETE WITH MICROSCOPIC (ARMC ONLY)
Bilirubin Urine: NEGATIVE
GLUCOSE, UA: NEGATIVE mg/dL
Ketones, ur: NEGATIVE mg/dL
NITRITE: NEGATIVE
PROTEIN: 30 mg/dL — AB
Specific Gravity, Urine: 1.004 — ABNORMAL LOW (ref 1.005–1.030)
pH: 6 (ref 5.0–8.0)

## 2015-08-21 LAB — BASIC METABOLIC PANEL
Anion gap: 8 (ref 5–15)
BUN: 12 mg/dL (ref 6–20)
CALCIUM: 9.1 mg/dL (ref 8.9–10.3)
CO2: 26 mmol/L (ref 22–32)
CREATININE: 0.9 mg/dL (ref 0.61–1.24)
Chloride: 107 mmol/L (ref 101–111)
GLUCOSE: 110 mg/dL — AB (ref 65–99)
Potassium: 3.6 mmol/L (ref 3.5–5.1)
Sodium: 141 mmol/L (ref 135–145)

## 2015-08-21 LAB — CBC
HCT: 42.7 % (ref 40.0–52.0)
HEMOGLOBIN: 15.1 g/dL (ref 13.0–18.0)
MCH: 32.9 pg (ref 26.0–34.0)
MCHC: 35.3 g/dL (ref 32.0–36.0)
MCV: 93.4 fL (ref 80.0–100.0)
PLATELETS: 172 10*3/uL (ref 150–440)
RBC: 4.57 MIL/uL (ref 4.40–5.90)
RDW: 12.9 % (ref 11.5–14.5)
WBC: 7.9 10*3/uL (ref 3.8–10.6)

## 2015-08-21 MED ORDER — CEPHALEXIN 500 MG PO CAPS: 500.0000 mg | ORAL_CAPSULE | Freq: Three times a day (TID) | ORAL | Status: DC

## 2015-08-21 MED ORDER — DEXTROSE 5 % IV SOLN
1.0000 g | Freq: Once | INTRAVENOUS | Status: AC
  Administered 2015-08-21: 1 g via INTRAVENOUS
  Filled 2015-08-21: qty 10

## 2015-08-21 NOTE — Discharge Instructions (Signed)

## 2015-08-21 NOTE — ED Notes (Signed)
MD Paduchowski at bedside  

## 2015-08-21 NOTE — ED Notes (Signed)
Pt's caregiver verbalized understanding of discharge instructions. NAD at this time. 

## 2015-08-21 NOTE — ED Provider Notes (Signed)
[facility] Emergency Department Provider Note  Time seen: 10:19 AM  I have reviewed the triage vital signs and the nursing notes.   HISTORY  Chief Complaint Hematuria    HPI [name] is a 75 y.o. male with a past medical history of cerebral palsy, mental retardation, prostate cancer, BPH, presents the emergency department bloody urine. According to the patient in the patient's caregiver who is here with him the urine has been bloody since this morning. Patient does appear a little uncomfortable but denies any pain. The caregiver states he will often deny pain even if he is hurting.Denies nausea, vomiting, diarrhea. Denies fever.   Past Medical History  Diagnosis Date  . Renal cyst   . Exotropia   . Lymphedema   . Venous insufficiency   . Wheelchair dependence   . Cerebral palsy ([place])   . Eczema   . Vitamin D deficiency   . Atrophy, disuse, muscle   . Hyperlipidemia   . Mental retardation   . Hematuria, gross   . BPH (benign prostatic hyperplasia)   . Lower urinary tract symptoms   . Incontinence   . Elevated PSA   . Over weight   . Urinary frequency   . Prostate cancer ([place])   . Urinary urgency     Patient Active Problem List   Diagnosis Date Noted  . Adenocarcinoma of prostate ([place]) 03/02/2015  . Amyotrophia 03/02/2015  . Edema leg 03/02/2015  . Cerebral palsy ([place]) 03/02/2015  . Dyslipidemia 03/02/2015  . Dermatitis, eczematoid 03/02/2015  . 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 H/O acute poliomyelitis 03/02/2015  . Acquired lymphedema 03/02/2015  . Mental retardation 03/02/2015  . Hypertrophy of nail 03/02/2015  . Adiposity 03/02/2015  . Absence of bladder continence 03/02/2015  . Chronic venous insufficiency 03/02/2015  . Avitaminosis D 03/02/2015  . Adynamia 03/02/2015  . Dependent on wheelchair 03/02/2015  . Kidney cysts 03/02/2015  . At risk for falling 03/02/2015  . Cerebral vascular disease 03/02/2015  . Strabismus  02/11/2015  . Urge incontinence of urine 02/11/2015  . Wheelchair dependence 02/11/2015  . Obesity 02/11/2015  . Prostate cancer ([place]) 10/30/2014  . Urinary frequency 10/30/2014    Past Surgical History  Procedure Laterality Date  . Leg circulation surgery Right   . Prostate surgery  11/10/2008    BRACHYTHERAPY    Current Outpatient Rx  Name  Route  Sig  Dispense  Refill  . alfuzosin (UROXATRAL) 10 MG 24 hr tablet   Oral   Take 1 tablet (10 mg total) by mouth daily with breakfast.   90 tablet   3   . ALPRAZolam (XANAX) 0.25 MG tablet   Oral   Take 1 tablet (0.25 mg total) by mouth 2 (two) times daily as needed for anxiety.   20 tablet   0     When bp is very high or anxious   . aspirin 81 MG tablet   Oral   Take 1 tablet (81 mg total) by mouth daily.   30 tablet   12   . augmented betamethasone dipropionate (DIPROLENE AF) 0.05 % cream      DIPROLENE AF, 0.05% (External Cream) - Historical Medication  apply to affected area on rash daily (0.05 %) Active         . betamethasone dipropionate (DIPROLENE) 0.05 % cream   Topical   Apply topically 2 (two) times daily.   90 g   1   . Calcium Carb-Cholecalciferol (CALCIUM-VITAMIN D3)  500-400 MG-UNIT TABS   Oral   Take 500 Units by mouth 2 (two) times daily.          . cloNIDine (CATAPRES) 0.1 MG tablet   Oral   Take 1 tablet (0.1 mg total) by mouth 3 (three) times daily.   30 tablet   0     To give if bp goes above 180/100 and does not drop ...   . Elastic Bandages & Supports (MEDICAL COMPRESSION STOCKINGS) MISC   Does not apply   1 Units by Does not apply route daily.   2 each   0   . ibuprofen (ADVIL,MOTRIN) 800 MG tablet   Oral   Take 1 tablet (800 mg total) by mouth every 8 (eight) hours as needed.   30 tablet   0     Allergies Review of patient's allergies indicates no known allergies.  Family History  Problem Relation Age of Onset  . Heart attack Brother   . Heart disease       Social History Social History  Substance Use Topics  . Smoking status: Never Smoker   . Smokeless tobacco: Never Used  . Alcohol Use: No    Review of Systems Constitutional: Negative for fever. Cardiovascular: Negative for chest pain. Respiratory: Negative for shortness of breath. Gastrointestinal: Negative for abdominal pain, vomiting and diarrhea. Genitourinary: Negative for dysuria. Positive for hematuria Musculoskeletal: Negative for back pain. Neurological: Negative for headache 10-point ROS otherwise negative.  ____________________________________________   PHYSICAL EXAM:  VITAL SIGNS: ED Triage Vitals  Enc Vitals Group     BP 08/21/15 1011 126/79 mmHg     Pulse Rate 08/21/15 1011 73     Resp 08/21/15 1011 16     Temp 08/21/15 1011 97.8 F (36.6 C)     Temp Source 08/21/15 1011 Oral     SpO2 08/21/15 1011 98 %     Weight --      Height --      Head Cir --      Peak Flow --      Pain Score --      Pain Loc --      Pain Edu? --      Excl. in [place]? --     Constitutional: Alert.  Well appearing and in no distress. Eyes: Normal exam ENT   Head: Normocephalic and atraumatic Cardiovascular: Normal rate, regular rhythm. No murmur Respiratory: Normal respiratory effort without tachypnea nor retractions. Breath sounds are clear  Abdomen:  Mild suprapubic tenderness to palpation. Otherwise benign abdominal exam without rebound, guarding or CVA tenderness. Musculoskeletal: Nontender with normal range of motion in all extremities. Neurologic:  Normal speech and language. No gross focal neurologic deficits Skin:  Skin is warm, dry and intact.  Psychiatric: Mood and affect are normal.   ____________________________________________    INITIAL IMPRESSION / ASSESSMENT AND PLAN / ED COURSE  Pertinent labs & imaging results that were available during my care of the patient were reviewed by me and considered in my medical decision making (see chart for  details).  Patient presents the emergency department bloody urine. Patient does appear somewhat uncomfortable when pressing in the suprapubic area and states it hurts a little. No CVA tenderness. Abdomen otherwise benign. No fever, nausea or vomiting. We will check labs including urinalysis and sent for urine culture.  Urinalysis is most consistent with urinary tract infection. The urine culture has been added on to the patient's urinalysis. We will dose Rocephin IV in  the emergency department. Discharged with Keflex and have the patient follow-up with his PCP.  ____________________________________________   FINAL CLINICAL IMPRESSION(S) / ED DIAGNOSES  Hematuria Urinary tract infection  Harvest Dark, MD 08/21/15 1141

## 2015-08-21 NOTE — ED Notes (Signed)
Pt arrived by POV with caregiver with c/o of blood in urine. Pt denies pain or difficulty urinating.

## 2015-08-23 DIAGNOSIS — R262 Difficulty in walking, not elsewhere classified: Secondary | ICD-10-CM | POA: Diagnosis not present

## 2015-08-23 DIAGNOSIS — M6281 Muscle weakness (generalized): Secondary | ICD-10-CM | POA: Diagnosis not present

## 2015-08-23 LAB — URINE CULTURE

## 2015-08-24 ENCOUNTER — Telehealth: Payer: Self-pay | Admitting: Family Medicine

## 2015-08-24 ENCOUNTER — Ambulatory Visit: Payer: Medicare Other | Admitting: Family Medicine

## 2015-08-24 NOTE — Telephone Encounter (Signed)
[name] from [name] states she called [facility] for [facility] referral appointment and they told her that they have not received anything from [place]. I did inform her that the information was sent and gave her the date and time it was sent. But I did inform her that I would ask the nurse to resend the information. Please call [name] at [phone] ext [phone]

## 2015-08-24 NOTE — Telephone Encounter (Signed)
Called over to [facility] and they stated they never received the fax from 08/20/15 even with out transmission log stating it successive went through. Will fax information again and call back tomorrow to check on status.

## 2015-08-30 DIAGNOSIS — M6281 Muscle weakness (generalized): Secondary | ICD-10-CM | POA: Diagnosis not present

## 2015-08-30 DIAGNOSIS — R262 Difficulty in walking, not elsewhere classified: Secondary | ICD-10-CM | POA: Diagnosis not present

## 2015-09-02 ENCOUNTER — Ambulatory Visit: Payer: Medicare Other | Admitting: Urology

## 2015-09-03 ENCOUNTER — Encounter: Payer: Self-pay | Admitting: Family Medicine

## 2015-09-03 ENCOUNTER — Ambulatory Visit (INDEPENDENT_AMBULATORY_CARE_PROVIDER_SITE_OTHER): Payer: Medicare Other | Admitting: Family Medicine

## 2015-09-03 VITALS — BP 142/82 | HR 64 | Temp 97.8°F | Resp 16 | Ht 66.0 in | Wt 162.0 lb

## 2015-09-03 DIAGNOSIS — N390 Urinary tract infection, site not specified: Secondary | ICD-10-CM

## 2015-09-03 DIAGNOSIS — R319 Hematuria, unspecified: Secondary | ICD-10-CM | POA: Diagnosis not present

## 2015-09-03 LAB — POCT URINALYSIS DIPSTICK
Bilirubin, UA: NEGATIVE
Glucose, UA: NEGATIVE
Ketones, UA: NEGATIVE
Leukocytes, UA: NEGATIVE
NITRITE UA: NEGATIVE
PROTEIN UA: NEGATIVE
RBC UA: NEGATIVE
SPEC GRAV UA: 1.02
UROBILINOGEN UA: NEGATIVE
pH, UA: 6

## 2015-09-03 NOTE — Progress Notes (Signed)
Name: [name]   MRN: [id number]    DOB: [birth year]   Date:09/03/2015       Progress Note  Subjective  Chief Complaint  Chief Complaint  Patient presents with  . Hospitalization Follow-up    Patient was admitted on 08/21/15 for hematuria-due to the staff saw blood when patient urinated and was diagnosed with a UTI. While in the ER was given IV antibiotics and sent home with antibiotic and has finished them. Patient denies any urinary symptoms.     HPI  UTI follow up: he has a history of BPH and was voiding at the group home on April 29th, and caregiver saw that he blood in his urine. He was sent to [facility] and had abnormal UA, he was given one dose of IV antibiotics and sent home on Keflex. He finished the course of antibiotics. No longer has blood in urine, no odor. He never had a fever or change in behavior. He has an appointment with Urologist in July .    Patient Active Problem List   Diagnosis Date Noted  . Adenocarcinoma of prostate ([place]) 03/02/2015  . Amyotrophia 03/02/2015  . Edema leg 03/02/2015  . Cerebral palsy ([place]) 03/02/2015  . Dyslipidemia 03/02/2015  . Dermatitis, eczematoid 03/02/2015  . 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 H/O acute poliomyelitis 03/02/2015  . Acquired lymphedema 03/02/2015  . Mental retardation 03/02/2015  . Hypertrophy of nail 03/02/2015  . Adiposity 03/02/2015  . Absence of bladder continence 03/02/2015  . Chronic venous insufficiency 03/02/2015  . Avitaminosis D 03/02/2015  . Adynamia 03/02/2015  . Dependent on wheelchair 03/02/2015  . Kidney cysts 03/02/2015  . At risk for falling 03/02/2015  . Cerebral vascular disease 03/02/2015  . Strabismus 02/11/2015  . Urge incontinence of urine 02/11/2015  . Wheelchair dependence 02/11/2015  . Obesity 02/11/2015  . Prostate cancer ([place]) 10/30/2014  . Urinary frequency 10/30/2014    Past Surgical History  Procedure Laterality Date  . Leg circulation surgery Right   . Prostate surgery   11/10/2008    BRACHYTHERAPY    Family History  Problem Relation Age of Onset  . Heart attack Brother   . Heart disease      Social History   Social History  . Marital Status: Single    Spouse Name: N/A  . Number of Children: N/A  . Years of Education: N/A   Occupational History  . Not on file.   Social History Main Topics  . Smoking status: Never Smoker   . Smokeless tobacco: Never Used  . Alcohol Use: No  . Drug Use: No  . Sexual Activity: No   Other Topics Concern  . Not on file   Social History Narrative   ** Merged History Encounter **         Current outpatient prescriptions:  .  alfuzosin (UROXATRAL) 10 MG 24 hr tablet, Take 1 tablet (10 mg total) by mouth daily with breakfast., Disp: 90 tablet, Rfl: 3 .  ALPRAZolam (XANAX) 0.25 MG tablet, Take 1 tablet (0.25 mg total) by mouth 2 (two) times daily as needed for anxiety., Disp: 20 tablet, Rfl: 0 .  aspirin 81 MG tablet, Take 1 tablet (81 mg total) by mouth daily., Disp: 30 tablet, Rfl: 12 .  augmented betamethasone dipropionate (DIPROLENE AF) 0.05 % cream, DIPROLENE AF, 0.05% (External Cream) - Historical Medication  apply to affected area on rash daily (0.05 %) Active, Disp: , Rfl:  .  betamethasone dipropionate (DIPROLENE) 0.05 % cream, Apply  topically 2 (two) times daily., Disp: 90 g, Rfl: 1 .  Calcium Carb-Cholecalciferol (CALCIUM-VITAMIN D3) 500-400 MG-UNIT TABS, Take 500 Units by mouth 2 (two) times daily. , Disp: , Rfl:  .  cloNIDine (CATAPRES) 0.1 MG tablet, Take 1 tablet (0.1 mg total) by mouth 3 (three) times daily., Disp: 30 tablet, Rfl: 0 .  Elastic Bandages & Supports (MEDICAL COMPRESSION STOCKINGS) MISC, 1 Units by Does not apply route daily., Disp: 2 each, Rfl: 0 .  ibuprofen (ADVIL,MOTRIN) 800 MG tablet, Take 1 tablet (800 mg total) by mouth every 8 (eight) hours as needed., Disp: 30 tablet, Rfl: 0  No Known Allergies   ROS  Ten systems reviewed and is negative except as mentioned in HPI    Objective  Filed Vitals:   09/03/15 1037  BP: 142/82  Pulse: 64  Temp: 97.8 F (36.6 C)  TempSrc: Oral  Resp: 16  Height: 5' 6"  (1.676 m)  Weight: 162 lb (73.483 kg)  SpO2: 98%    Body mass index is 26.16 kg/(m^2).  Physical Exam  Constitutional: Patient appears well-developed and well-nourished. Obese No distress.  HEENT: head atraumatic, normocephalic, pupils equal and reactive to light,  neck supple, throat within normal limits Cardiovascular: Normal rate, regular rhythm and normal heart sounds.  No murmur heard. Trace  BLE ankle  edema. Pulmonary/Chest: Effort normal and breath sounds normal. No respiratory distress. Abdominal: Soft.  There is no tenderness. Negative CVA tenderness Psychiatric: Patient has a normal mood and affect. behavior is normal.  Muscular Skeletal: wheelchair bound  Recent Results (from the past 2160 hour(s))  VITAMIN D 25 Hydroxy (Vit-D Deficiency, Fractures)     Status: None   Collection Time: 08/19/15  2:54 PM  Result Value Ref Range   Vit D, 25-Hydroxy 36.0 30.0 - 100.0 ng/mL    Comment: Vitamin D deficiency has been defined by the [place] practice guideline as a level of serum 25-OH vitamin D less than 20 ng/mL (1,2). The Endocrine Society went on to further define vitamin D insufficiency as a level between 21 and 29 ng/mL (2). 1. IOM (Institute of Medicine). 2010. Dietary reference    intakes for calcium and D. [place]: The    [employer]. 2. Holick MF, Binkley [place], Bischoff-Ferrari HA, et al.    Evaluation, treatment, and prevention of vitamin D    deficiency: an Endocrine Society clinical practice    guideline. JCEM. 2011 Jul; 96(7):1911-30.   CBC     Status: None   Collection Time: 08/21/15 10:19 AM  Result Value Ref Range   WBC 7.9 3.8 - 10.6 K/uL   RBC 4.57 4.40 - 5.90 MIL/uL   Hemoglobin 15.1 13.0 - 18.0 g/dL   HCT 42.7 40.0 - 52.0 %   MCV 93.4 80.0 - 100.0 fL   MCH  32.9 26.0 - 34.0 pg   MCHC 35.3 32.0 - 36.0 g/dL   RDW 12.9 11.5 - 14.5 %   Platelets 172 150 - 440 K/uL  Basic metabolic panel     Status: Abnormal   Collection Time: 08/21/15 10:19 AM  Result Value Ref Range   Sodium 141 135 - 145 mmol/L   Potassium 3.6 3.5 - 5.1 mmol/L   Chloride 107 101 - 111 mmol/L   CO2 26 22 - 32 mmol/L   Glucose, Bld 110 (H) 65 - 99 mg/dL   BUN 12 6 - 20 mg/dL   Creatinine, Ser 0.90 0.61 - 1.24 mg/dL  Calcium 9.1 8.9 - 10.3 mg/dL   GFR calc non Af Amer >60 >60 mL/min   GFR calc Af Amer >60 >60 mL/min    Comment: (NOTE) The eGFR has been calculated using the CKD EPI equation. This calculation has not been validated in all clinical situations. eGFR's persistently <60 mL/min signify possible Chronic Kidney Disease.    Anion gap 8 5 - 15  Urinalysis complete, with microscopic ([facility] only)     Status: Abnormal   Collection Time: 08/21/15 10:20 AM  Result Value Ref Range   Color, Urine YELLOW (A) YELLOW   APPearance CLEAR (A) CLEAR   Glucose, UA NEGATIVE NEGATIVE mg/dL   Bilirubin Urine NEGATIVE NEGATIVE   Ketones, ur NEGATIVE NEGATIVE mg/dL   Specific Gravity, Urine 1.004 (L) 1.005 - 1.030   Hgb urine dipstick 3+ (A) NEGATIVE   pH 6.0 5.0 - 8.0   Protein, ur 30 (A) NEGATIVE mg/dL   Nitrite NEGATIVE NEGATIVE   Leukocytes, UA 3+ (A) NEGATIVE   RBC / HPF TOO NUMEROUS TO COUNT 0 - 5 RBC/hpf   WBC, UA TOO NUMEROUS TO COUNT 0 - 5 WBC/hpf   Bacteria, UA MANY (A) NONE SEEN   Squamous Epithelial / LPF 0-5 (A) NONE SEEN   WBC Clumps PRESENT    Mucous PRESENT   Urine culture     Status: Abnormal   Collection Time: 08/21/15 10:20 AM  Result Value Ref Range   Specimen Description URINE, RANDOM    Special Requests NONE    Culture >=100,000 COLONIES/mL ESCHERICHIA COLI (A)    Report Status 08/23/2015 FINAL    Organism ID, Bacteria ESCHERICHIA COLI (A)       Susceptibility   Escherichia coli - MIC*    AMPICILLIN 4 SENSITIVE Sensitive     CEFAZOLIN <=4  SENSITIVE Sensitive     CEFTRIAXONE <=1 SENSITIVE Sensitive     CIPROFLOXACIN <=0.25 SENSITIVE Sensitive     GENTAMICIN <=1 SENSITIVE Sensitive     IMIPENEM <=0.25 SENSITIVE Sensitive     NITROFURANTOIN 32 SENSITIVE Sensitive     TRIMETH/SULFA <=20 SENSITIVE Sensitive     AMPICILLIN/SULBACTAM <=2 SENSITIVE Sensitive     PIP/TAZO <=4 SENSITIVE Sensitive     Extended ESBL NEGATIVE Sensitive     * >=100,000 COLONIES/mL ESCHERICHIA COLI  POCT Urinalysis Dipstick     Status: Normal   Collection Time: 09/03/15 10:42 AM  Result Value Ref Range   Color, UA light yellow    Clarity, UA clear    Glucose, UA neg    Bilirubin, UA neg    Ketones, UA neg    Spec Grav, UA 1.020    Blood, UA neg    pH, UA 6.0    Protein, UA neg    Urobilinogen, UA negative    Nitrite, UA neg    Leukocytes, UA Negative Negative     PHQ2/9: Depression screen [facility] 2/9 08/19/2015 07/19/2015 03/02/2015 02/11/2015  Decreased Interest 0 0 0 0  Down, Depressed, Hopeless 1 0 1 0  PHQ - 2 Score 1 0 1 0    Fall Risk: Fall Risk  08/19/2015 07/19/2015 03/02/2015 02/11/2015  Falls in the past year? No No Yes Yes  Number falls in past yr: - - 2 or more 1  Injury with Fall? - - Yes No  Risk for fall due to : - - History of fall(s);Impaired balance/gait;Impaired mobility -     Assessment & Plan  1. Urinary tract infection with hematuria, site unspecified  Doing well. Advised them to get his weight at his work - can ride the wheelchair on the scale.  - POCT Urinalysis Dipstick - Urine culture

## 2015-09-05 LAB — URINE CULTURE

## 2015-09-06 DIAGNOSIS — D3702 Neoplasm of uncertain behavior of tongue: Secondary | ICD-10-CM | POA: Diagnosis not present

## 2015-09-08 ENCOUNTER — Encounter: Payer: Self-pay | Admitting: Urology

## 2015-09-08 ENCOUNTER — Ambulatory Visit (INDEPENDENT_AMBULATORY_CARE_PROVIDER_SITE_OTHER): Payer: Medicare Other | Admitting: Urology

## 2015-09-08 VITALS — BP 138/72 | HR 68

## 2015-09-08 DIAGNOSIS — C61 Malignant neoplasm of prostate: Secondary | ICD-10-CM

## 2015-09-08 DIAGNOSIS — N390 Urinary tract infection, site not specified: Secondary | ICD-10-CM | POA: Diagnosis not present

## 2015-09-08 LAB — URINALYSIS, COMPLETE
BILIRUBIN UA: NEGATIVE
Glucose, UA: NEGATIVE
Ketones, UA: NEGATIVE
NITRITE UA: POSITIVE — AB
PH UA: 5 (ref 5.0–7.5)
Protein, UA: NEGATIVE
Specific Gravity, UA: 1.02 (ref 1.005–1.030)
UUROB: 0.2 mg/dL (ref 0.2–1.0)

## 2015-09-08 LAB — MICROSCOPIC EXAMINATION
RBC MICROSCOPIC, UA: NONE SEEN /HPF (ref 0–?)
WBC, UA: >30 /hpf — ABNORMAL HIGH (ref 0–?)

## 2015-09-08 LAB — BLADDER SCAN AMB NON-IMAGING: SCAN RESULT: 129

## 2015-09-08 NOTE — Progress Notes (Signed)
09/08/2015 12:14 PM   [name] 04-04-1941 [id number]  Referring provider: Steele Sizer, MD [address] [place], [place] [address]  Chief Complaint  Patient presents with  . Urinary Tract Infection    patient seen twice in one month for uti  . Hematuria    HPI: Patient is a 75 year old Caucasian male with mental disabilities who has been seen twice in a month for UTI and had an episode of gross hematuria who is referred by his primary care physician Dr. Ancil Macias for further evaluation and management.  Patient's caregivers had noticed episodes of gross hematuria and carried the patient to the emergency room on 08/21/2015.  That time, [name] was not complaining of pain.  He was afebrile and vital signs were normal in the emergency department.  His UA was suspicious for infection.  He was given a dose of IV Rocephin and discharged with Keflex and instructed to follow-up with his PCP.  Urine culture from that visit did grow out Escherichia coli which was sensitive to the Rocephin and Keflex.  [name] urine did not have blood or order after the antibiotics were initiated.  He was seen by his PCP on 09/03/2015.  His vital signs were stable at that visit and he was afebrile.  UA dipstick was clear. His urine was sent for culture.  His urine culture grew out mixed urogenital flora and he was referred to [place] for further evaluation.  Today, the patient has no complaints.  His vital signs are stable.  PVR with [place] is 129 mL. He is taking alfuzosin daily.  His UA today is nitrite positive, with greater than 30 WBCs and many bacteria per high-powered field.     PMH: Past Medical History  Diagnosis Date  . Renal cyst   . Exotropia   . Lymphedema   . Venous insufficiency   . Wheelchair dependence   . Cerebral palsy ([place])   . Eczema   . Vitamin D deficiency   . Atrophy, disuse, muscle   . Hyperlipidemia   . Mental retardation   . Hematuria, gross   . BPH  (benign prostatic hyperplasia)   . Lower urinary tract symptoms   . Incontinence   . Elevated PSA   . Over weight   . Urinary frequency   . Prostate cancer ([place])   . Urinary urgency     Surgical History: Past Surgical History  Procedure Laterality Date  . Leg circulation surgery Right   . Prostate surgery  11/10/2008    BRACHYTHERAPY    Home Medications:    Medication List       This list is accurate as of: 09/08/15 11:59 PM.  Always use your most recent med list.               alfuzosin 10 MG 24 hr tablet  Commonly known as:  UROXATRAL  Take 1 tablet (10 mg total) by mouth daily with breakfast.     ALPRAZolam 0.25 MG tablet  Commonly known as:  XANAX  Take 1 tablet (0.25 mg total) by mouth 2 (two) times daily as needed for anxiety.     aspirin 81 MG tablet  Take 1 tablet (81 mg total) by mouth daily.     betamethasone dipropionate 0.05 % cream  Commonly known as:  DIPROLENE  Apply topically 2 (two) times daily.     Calcium-Vitamin D3 500-400 MG-UNIT Tabs  Take 500 Units by mouth 2 (two) times daily.  cloNIDine 0.1 MG tablet  Commonly known as:  CATAPRES  Take 1 tablet (0.1 mg total) by mouth 3 (three) times daily.     DIPROLENE AF 0.05 % cream  Generic drug:  augmented betamethasone dipropionate  Reported on 09/08/2015     ibuprofen 800 MG tablet  Commonly known as:  ADVIL,MOTRIN  Take 1 tablet (800 mg total) by mouth every 8 (eight) hours as needed.     Medical Compression Stockings Misc  1 Units by Does not apply route daily.        Allergies: No Known Allergies  Family History: Family History  Problem Relation Age of Onset  . Heart attack Brother   . Heart disease      Social History:  reports that he has never smoked. He has never used smokeless tobacco. He reports that he does not drink alcohol or use illicit drugs.  ROS: UROLOGY Frequent Urination?: No Hard to postpone urination?: No Burning/pain with urination?: No Get up at  night to urinate?: No Leakage of urine?: No Urine stream starts and stops?: No Trouble starting stream?: No Do you have to strain to urinate?: No Blood in urine?: Yes Urinary tract infection?: Yes Sexually transmitted disease?: No Injury to kidneys or bladder?: No Painful intercourse?: No Weak stream?: No Erection problems?: No Penile pain?: No  Gastrointestinal Nausea?: No Vomiting?: No Indigestion/heartburn?: No Diarrhea?: No Constipation?: No  Constitutional Fever: No Night sweats?: No Weight loss?: No Fatigue?: No  Skin Skin rash/lesions?: No Itching?: No  Eyes Blurred vision?: No Double vision?: No  Ears/Nose/Throat Sore throat?: No Sinus problems?: No  Hematologic/Lymphatic Swollen glands?: No Easy bruising?: No  Cardiovascular Leg swelling?: No Chest pain?: No  Respiratory Cough?: No Shortness of breath?: No  Endocrine Excessive thirst?: No  Musculoskeletal Back pain?: No Joint pain?: No  Neurological Headaches?: No Dizziness?: No  Psychologic Depression?: No Anxiety?: No  Physical Exam: BP 138/72 mmHg  Pulse 68  Constitutional: Well nourished. Alert and oriented, No acute distress. HEENT: [place] AT, moist mucus membranes. Trachea midline, no masses. Cardiovascular: No clubbing, cyanosis, or edema. Respiratory: Normal respiratory effort, no increased work of breathing. GI: Abdomen is soft, non tender, non distended, no abdominal masses. Skin: No rashes, bruises or suspicious lesions. Lymph: No cervical or inguinal adenopathy. Neurologic: Grossly intact, no focal deficits, moving all 4 extremities. Psychiatric: Normal mood and affect.  Laboratory Data: Lab Results  Component Value Date   WBC 7.9 08/21/2015   HGB 15.1 08/21/2015   HCT 42.7 08/21/2015   MCV 93.4 08/21/2015   PLT 172 08/21/2015    Lab Results  Component Value Date   CREATININE 0.90 08/21/2015     Lab Results  Component Value Date   HGBA1C 4.9 02/11/2015     Lab Results  Component Value Date   TSH 3.010 02/11/2015       Component Value Date/Time   CHOL 130 06/23/2013   HDL 50 06/23/2013   LDLCALC 66 06/23/2013    Lab Results  Component Value Date   AST 15 02/11/2015   Lab Results  Component Value Date   ALT 11 02/11/2015   PSA History <0.1 ng/mL on 10/30/2014  Urinalysis Results for orders placed or performed in visit on 09/08/15  CULTURE, URINE COMPREHENSIVE  Result Value Ref Range   Urine Culture, Comprehensive Final report (A)    Result 1 Escherichia coli (A)    ANTIMICROBIAL SUSCEPTIBILITY Comment   Microscopic Examination  Result Value Ref Range   WBC, UA >  30 (H) 0 -  5 /hpf   RBC, UA None seen 0 -  2 /hpf   Epithelial Cells (non renal) 0-10 0 - 10 /hpf   Bacteria, UA Many (A) None seen/Few  Urinalysis, Complete  Result Value Ref Range   Specific Gravity, UA 1.020 1.005 - 1.030   pH, UA 5.0 5.0 - 7.5   Color, UA Yellow Yellow   Appearance Ur Cloudy (A) Clear   Leukocytes, UA 3+ (A) Negative   Protein, UA Negative Negative/Trace   Glucose, UA Negative Negative   Ketones, UA Negative Negative   RBC, UA 2+ (A) Negative   Bilirubin, UA Negative Negative   Urobilinogen, Ur 0.2 0.2 - 1.0 mg/dL   Nitrite, UA Positive (A) Negative   Microscopic Examination See below:   BLADDER SCAN AMB NON-IMAGING  Result Value Ref Range   Scan Result 129     Pertinent Imaging: Results for ARCH, PLANT (MRN [id number]) as of 09/11/2015 12:06  Ref. Range 09/08/2015 14:30  Scan Result Unknown 129    Assessment & Plan:   1. Recurrent UTI:   UA is suspicious for infection.  Will send for culture.  Will hold on prescribing an antibiotic at this time as patient is asymptomatic.  Caregivers states that patient will not complain of pain.  I have asked the caregivers to look for signs of increased aggravation or irritation, fevers or other return of gross hematuria as we can use these symptoms to evaluate whether  this is an UTI versus colonization.   Follow-up will be based on urine culture results.    - Urinalysis, Complete - BLADDER SCAN AMB NON-IMAGING - CULTURE, URINE COMPREHENSIVE   2. Prostate cancer: Patient was diagnosed with a ( T1 (c), N0 and M0 ) Gleason score 3 + 4 adenocarcinoma involving the left side of the prostate of the prostate. Tumor markers include PSA level of 4.1 ng/mL. Current diagnosis was determined by transrectal ultrasound and needle biopsy of the prostate (completed on 08/27/2008). Treatment has included brachytherapy (completed on 11/10/2008). His last PSA was <0.1 ng/mL on 10/30/2014. He will RTC in July 2017  for exam and PSA.  Return for Follow-up pending urine culture results.  These notes generated with voice recognition software. I apologize for typographical errors.  Zara Council, [facility] [address], [place], [place] [address] [phone]

## 2015-09-10 LAB — CULTURE, URINE COMPREHENSIVE

## 2015-09-11 DIAGNOSIS — N390 Urinary tract infection, site not specified: Secondary | ICD-10-CM | POA: Insufficient documentation

## 2015-09-11 DIAGNOSIS — R8271 Bacteriuria: Secondary | ICD-10-CM | POA: Insufficient documentation

## 2015-09-13 ENCOUNTER — Telehealth: Payer: Self-pay

## 2015-09-13 DIAGNOSIS — R262 Difficulty in walking, not elsewhere classified: Secondary | ICD-10-CM | POA: Diagnosis not present

## 2015-09-13 DIAGNOSIS — N390 Urinary tract infection, site not specified: Secondary | ICD-10-CM

## 2015-09-13 DIAGNOSIS — M6281 Muscle weakness (generalized): Secondary | ICD-10-CM | POA: Diagnosis not present

## 2015-09-13 MED ORDER — AMOXICILLIN-POT CLAVULANATE 875-125 MG PO TABS: 1.0000 | ORAL_TABLET | Freq: Two times a day (BID) | ORAL | Status: AC

## 2015-09-13 NOTE — Telephone Encounter (Signed)
-----   Message from Nori Riis, PA-C sent at 09/12/2015  6:18 PM EDT ----- Patient has a +UCx.  They need to start Augmentin 875/125 mg,  one tablet twice daily for seven days and then we need to check a CATH specimen in 3 to 5 days after they complete their antibiotics.

## 2015-09-13 NOTE — Telephone Encounter (Signed)
No answer. Medication sent to pharmacy.  

## 2015-09-14 NOTE — Telephone Encounter (Signed)
LMOM

## 2015-09-15 NOTE — Telephone Encounter (Signed)
Spoke with pt who stated they picked up abx. Made aware for the need of a cath specimen. Pt stated he would call back for an appt.

## 2015-09-27 DIAGNOSIS — R262 Difficulty in walking, not elsewhere classified: Secondary | ICD-10-CM | POA: Diagnosis not present

## 2015-09-27 DIAGNOSIS — M6281 Muscle weakness (generalized): Secondary | ICD-10-CM | POA: Diagnosis not present

## 2015-09-28 ENCOUNTER — Telehealth: Payer: Self-pay

## 2015-09-28 NOTE — Telephone Encounter (Signed)
The caretaker, [name], called stating that they were told to inform Dr. Ancil Boozer if this patient stated to act differently. She called stating that for the past month, this patient has started showing signs of depression and has begun verbally lashing out to another resident.   Patient has not tried to harm himself or the staff.   Please advise.

## 2015-09-28 NOTE — Telephone Encounter (Signed)
He needs to come in, we will need to start antidepressant medications

## 2015-09-29 NOTE — Telephone Encounter (Signed)
Please call [name], the caretaker of this patient, and see if they can bring him in for a visit.   [phone]  Thanks

## 2015-09-29 NOTE — Telephone Encounter (Signed)
LVM for [name] to call me back to get appt scheduled for patient.

## 2015-09-30 ENCOUNTER — Encounter: Payer: Self-pay | Admitting: Family Medicine

## 2015-09-30 ENCOUNTER — Ambulatory Visit (INDEPENDENT_AMBULATORY_CARE_PROVIDER_SITE_OTHER): Payer: Medicare Other | Admitting: Family Medicine

## 2015-09-30 VITALS — BP 128/68 | HR 72 | Temp 98.5°F | Resp 16 | Ht 71.0 in | Wt 158.6 lb

## 2015-09-30 DIAGNOSIS — R319 Hematuria, unspecified: Secondary | ICD-10-CM

## 2015-09-30 DIAGNOSIS — F320 Major depressive disorder, single episode, mild: Secondary | ICD-10-CM

## 2015-09-30 DIAGNOSIS — F4321 Adjustment disorder with depressed mood: Secondary | ICD-10-CM

## 2015-09-30 DIAGNOSIS — N390 Urinary tract infection, site not specified: Secondary | ICD-10-CM | POA: Diagnosis not present

## 2015-09-30 DIAGNOSIS — Z634 Disappearance and death of family member: Principal | ICD-10-CM

## 2015-09-30 DIAGNOSIS — F4329 Adjustment disorder with other symptoms: Secondary | ICD-10-CM

## 2015-09-30 MED ORDER — SERTRALINE HCL 50 MG PO TABS: 50.0000 mg | ORAL_TABLET | Freq: Every day | ORAL | Status: DC

## 2015-09-30 NOTE — Progress Notes (Signed)
Name: [name]   MRN: [id number]    DOB: 08/19/1940   Date:09/30/2015       Progress Note  Subjective  Chief Complaint  Chief Complaint  Patient presents with  . Mood Changes    Patient caregivers have noticed that since his sister have passed 3 months ago that he has been more down than usually and lashing out on others. Patient sister used to pick him up on occasions and take him home with her and since passing he has been depressed and not acting his like he usually does.     HPI  Grieving/Depression Mild Major: he lost his sister 3 months ago and he has been isolating himself in his room, seems to be sad and has been having problems with one other roommate. He denies crying, but states he feels lonely. Caregiver - Mrs Jeneen Rinks - states he was raised not to cry and he has noticed that he is not as cheerful.   UTI: recently treated for UTI, no dysuria, we will recheck to make sure appropriately treated.    Patient Active Problem List   Diagnosis Date Noted  . Recurrent UTI 09/11/2015  . Adenocarcinoma of prostate ([place]) 03/02/2015  . Amyotrophia 03/02/2015  . Edema leg 03/02/2015  . Cerebral palsy ([place]) 03/02/2015  . Dyslipidemia 03/02/2015  . Dermatitis, eczematoid 03/02/2015  . 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 H/O acute poliomyelitis 03/02/2015  . Acquired lymphedema 03/02/2015  . Mental retardation 03/02/2015  . Hypertrophy of nail 03/02/2015  . Adiposity 03/02/2015  . Absence of bladder continence 03/02/2015  . Chronic venous insufficiency 03/02/2015  . Avitaminosis D 03/02/2015  . Adynamia 03/02/2015  . Dependent on wheelchair 03/02/2015  . Kidney cysts 03/02/2015  . At risk for falling 03/02/2015  . Cerebral vascular disease 03/02/2015  . Strabismus 02/11/2015  . Urge incontinence of urine 02/11/2015  . Wheelchair dependence 02/11/2015  . Obesity 02/11/2015  . Prostate cancer ([place]) 10/30/2014  . Urinary frequency 10/30/2014    Past Surgical History   Procedure Laterality Date  . Leg circulation surgery Right   . Prostate surgery  11/10/2008    BRACHYTHERAPY    Family History  Problem Relation Age of Onset  . Heart attack Brother   . Heart disease      Social History   Social History  . Marital Status: Single    Spouse Name: N/A  . Number of Children: N/A  . Years of Education: N/A   Occupational History  . Not on file.   Social History Main Topics  . Smoking status: Never Smoker   . Smokeless tobacco: Never Used  . Alcohol Use: No  . Drug Use: No  . Sexual Activity: No   Other Topics Concern  . Not on file   Social History Narrative   ** Merged History Encounter **         Current outpatient prescriptions:  .  alfuzosin (UROXATRAL) 10 MG 24 hr tablet, Take 1 tablet (10 mg total) by mouth daily with breakfast., Disp: 90 tablet, Rfl: 3 .  ALPRAZolam (XANAX) 0.25 MG tablet, Take 1 tablet (0.25 mg total) by mouth 2 (two) times daily as needed for anxiety., Disp: 20 tablet, Rfl: 0 .  aspirin 81 MG tablet, Take 1 tablet (81 mg total) by mouth daily., Disp: 30 tablet, Rfl: 12 .  augmented betamethasone dipropionate (DIPROLENE AF) 0.05 % cream, Reported on 09/08/2015, Disp: , Rfl:  .  betamethasone dipropionate (DIPROLENE) 0.05 % cream, Apply topically  2 (two) times daily., Disp: 90 g, Rfl: 1 .  Calcium Carb-Cholecalciferol (CALCIUM-VITAMIN D3) 500-400 MG-UNIT TABS, Take 500 Units by mouth 2 (two) times daily. , Disp: , Rfl:  .  cloNIDine (CATAPRES) 0.1 MG tablet, Take 1 tablet (0.1 mg total) by mouth 3 (three) times daily., Disp: 30 tablet, Rfl: 0 .  Elastic Bandages & Supports (MEDICAL COMPRESSION STOCKINGS) MISC, 1 Units by Does not apply route daily., Disp: 2 each, Rfl: 0 .  ibuprofen (ADVIL,MOTRIN) 800 MG tablet, Take 1 tablet (800 mg total) by mouth every 8 (eight) hours as needed., Disp: 30 tablet, Rfl: 0 .  sertraline (ZOLOFT) 50 MG tablet, Take 1 tablet (50 mg total) by mouth daily., Disp: 30 tablet, Rfl:  0  No Known Allergies   ROS  Ten systems reviewed and is negative except as mentioned in HPI . Still getting PT  Objective  Filed Vitals:   09/30/15 1053  BP: 128/68  Pulse: 72  Temp: 98.5 F (36.9 C)  TempSrc: Oral  Resp: 16  Height: 5' 11"  (1.803 m)  Weight: 158 lb 9.6 oz (71.94 kg)  SpO2: 97%    Body mass index is 22.13 kg/(m^2).  Physical Exam  Constitutional: Patient appears well-developed and well-nourished. Obese  No distress.  HEENT: head atraumatic, normocephalic,neck supple, throat within normal limits Cardiovascular: Normal rate, regular rhythm and normal heart sounds.  No murmur heard. trace BLE ankle  edema. Pulmonary/Chest: Effort normal and breath sounds normal. No respiratory distress. Abdominal: Soft.  There is no tenderness. Psychiatric: Patient has a normal mood during his visit. Follows simple commands, hard to understand his speech but able to communicate verabally  Recent Results (from the past 2160 hour(s))  VITAMIN D 25 Hydroxy (Vit-D Deficiency, Fractures)     Status: None   Collection Time: 08/19/15  2:54 PM  Result Value Ref Range   Vit D, 25-Hydroxy 36.0 30.0 - 100.0 ng/mL    Comment: Vitamin D deficiency has been defined by the [place] practice guideline as a level of serum 25-OH vitamin D less than 20 ng/mL (1,2). The Endocrine Society went on to further define vitamin D insufficiency as a level between 21 and 29 ng/mL (2). 1. IOM (Institute of Medicine). 2010. Dietary reference    intakes for calcium and D. [place]: The    [employer]. 2. Holick MF, Binkley Herbst, Bischoff-Ferrari HA, et al.    Evaluation, treatment, and prevention of vitamin D    deficiency: an Endocrine Society clinical practice    guideline. JCEM. 2011 Jul; 96(7):1911-30.   CBC     Status: None   Collection Time: 08/21/15 10:19 AM  Result Value Ref Range   WBC 7.9 3.8 - 10.6 K/uL   RBC 4.57 4.40 - 5.90  MIL/uL   Hemoglobin 15.1 13.0 - 18.0 g/dL   HCT 42.7 40.0 - 52.0 %   MCV 93.4 80.0 - 100.0 fL   MCH 32.9 26.0 - 34.0 pg   MCHC 35.3 32.0 - 36.0 g/dL   RDW 12.9 11.5 - 14.5 %   Platelets 172 150 - 440 K/uL  Basic metabolic panel     Status: Abnormal   Collection Time: 08/21/15 10:19 AM  Result Value Ref Range   Sodium 141 135 - 145 mmol/L   Potassium 3.6 3.5 - 5.1 mmol/L   Chloride 107 101 - 111 mmol/L   CO2 26 22 - 32 mmol/L   Glucose, Bld 110 (H) 65 -  99 mg/dL   BUN 12 6 - 20 mg/dL   Creatinine, Ser 0.90 0.61 - 1.24 mg/dL   Calcium 9.1 8.9 - 10.3 mg/dL   GFR calc non Af Amer >60 >60 mL/min   GFR calc Af Amer >60 >60 mL/min    Comment: (NOTE) The eGFR has been calculated using the CKD EPI equation. This calculation has not been validated in all clinical situations. eGFR's persistently <60 mL/min signify possible Chronic Kidney Disease.    Anion gap 8 5 - 15  Urinalysis complete, with microscopic ([facility] only)     Status: Abnormal   Collection Time: 08/21/15 10:20 AM  Result Value Ref Range   Color, Urine YELLOW (A) YELLOW   APPearance CLEAR (A) CLEAR   Glucose, UA NEGATIVE NEGATIVE mg/dL   Bilirubin Urine NEGATIVE NEGATIVE   Ketones, ur NEGATIVE NEGATIVE mg/dL   Specific Gravity, Urine 1.004 (L) 1.005 - 1.030   Hgb urine dipstick 3+ (A) NEGATIVE   pH 6.0 5.0 - 8.0   Protein, ur 30 (A) NEGATIVE mg/dL   Nitrite NEGATIVE NEGATIVE   Leukocytes, UA 3+ (A) NEGATIVE   RBC / HPF TOO NUMEROUS TO COUNT 0 - 5 RBC/hpf   WBC, UA TOO NUMEROUS TO COUNT 0 - 5 WBC/hpf   Bacteria, UA MANY (A) NONE SEEN   Squamous Epithelial / LPF 0-5 (A) NONE SEEN   WBC Clumps PRESENT    Mucous PRESENT   Urine culture     Status: Abnormal   Collection Time: 08/21/15 10:20 AM  Result Value Ref Range   Specimen Description URINE, RANDOM    Special Requests NONE    Culture >=100,000 COLONIES/mL ESCHERICHIA COLI (A)    Report Status 08/23/2015 FINAL    Organism ID, Bacteria ESCHERICHIA COLI (A)        Susceptibility   Escherichia coli - MIC*    AMPICILLIN 4 SENSITIVE Sensitive     CEFAZOLIN <=4 SENSITIVE Sensitive     CEFTRIAXONE <=1 SENSITIVE Sensitive     CIPROFLOXACIN <=0.25 SENSITIVE Sensitive     GENTAMICIN <=1 SENSITIVE Sensitive     IMIPENEM <=0.25 SENSITIVE Sensitive     NITROFURANTOIN 32 SENSITIVE Sensitive     TRIMETH/SULFA <=20 SENSITIVE Sensitive     AMPICILLIN/SULBACTAM <=2 SENSITIVE Sensitive     PIP/TAZO <=4 SENSITIVE Sensitive     Extended ESBL NEGATIVE Sensitive     * >=100,000 COLONIES/mL ESCHERICHIA COLI  Urine culture     Status: None   Collection Time: 09/03/15 12:00 AM  Result Value Ref Range   Urine Culture, Routine Final report    Urine Culture result 1 Comment     Comment: Mixed urogenital flora Greater than 100,000 colony forming units per mL   POCT Urinalysis Dipstick     Status: Normal   Collection Time: 09/03/15 10:42 AM  Result Value Ref Range   Color, UA light yellow    Clarity, UA clear    Glucose, UA neg    Bilirubin, UA neg    Ketones, UA neg    Spec Grav, UA 1.020    Blood, UA neg    pH, UA 6.0    Protein, UA neg    Urobilinogen, UA negative    Nitrite, UA neg    Leukocytes, UA Negative Negative  Urinalysis, Complete     Status: Abnormal   Collection Time: 09/08/15  2:03 PM  Result Value Ref Range   Specific Gravity, UA 1.020 1.005 - 1.030   pH, UA 5.0 5.0 -  7.5   Color, UA Yellow Yellow   Appearance Ur Cloudy (A) Clear   Leukocytes, UA 3+ (A) Negative   Protein, UA Negative Negative/Trace   Glucose, UA Negative Negative   Ketones, UA Negative Negative   RBC, UA 2+ (A) Negative   Bilirubin, UA Negative Negative   Urobilinogen, Ur 0.2 0.2 - 1.0 mg/dL   Nitrite, UA Positive (A) Negative   Microscopic Examination See below:   Microscopic Examination     Status: Abnormal   Collection Time: 09/08/15  2:03 PM  Result Value Ref Range   WBC, UA >30 (H) 0 -  5 /hpf   RBC, UA None seen 0 -  2 /hpf   Epithelial Cells (non renal)  0-10 0 - 10 /hpf   Bacteria, UA Many (A) None seen/Few  CULTURE, URINE COMPREHENSIVE     Status: Abnormal   Collection Time: 09/08/15  2:22 PM  Result Value Ref Range   Urine Culture, Comprehensive Final report (A)    Result 1 Escherichia coli (A)     Comment: Greater than 100,000 colony forming units per mL   ANTIMICROBIAL SUSCEPTIBILITY Comment     Comment:       ** S = Susceptible; I = Intermediate; R = Resistant **                    P = Positive; N = Negative             MICS are expressed in micrograms per mL    Antibiotic                 RSLT#1    RSLT#2    RSLT#3    RSLT#4 Amoxicillin/Clavulanic Acid    S Ampicillin                     S Cefepime                       S Ceftriaxone                    S Cefuroxime                     S Cephalothin                    S Ciprofloxacin                  S Ertapenem                      S Gentamicin                     S Imipenem                       S Levofloxacin                   S Nitrofurantoin                 S Piperacillin                   S Tetracycline                   S Tobramycin                     S Trimethoprim/Sulfa  S   BLADDER SCAN AMB NON-IMAGING     Status: None   Collection Time: 09/08/15  2:30 PM  Result Value Ref Range   Scan Result 129       PHQ2/9: Depression screen [facility] 2/9 08/19/2015 07/19/2015 03/02/2015 02/11/2015  Decreased Interest 0 0 0 0  Down, Depressed, Hopeless 1 0 1 0  PHQ - 2 Score 1 0 1 0     Fall Risk: Fall Risk  08/19/2015 07/19/2015 03/02/2015 02/11/2015  Falls in the past year? No No Yes Yes  Number falls in past yr: - - 2 or more 1  Injury with Fall? - - Yes No  Risk for fall due to : - - History of fall(s);Impaired balance/gait;Impaired mobility -     Assessment & Plan  1. Complicated grieving  - sertraline (ZOLOFT) 50 MG tablet; Take 1 tablet (50 mg total) by mouth daily.  Dispense: 30 tablet; Refill: 0  2. Mild major depression ([place])  Discussed  importance of taking at the same time, spent half of the time in counseling today. Discussed possible side effects of medication  - sertraline (ZOLOFT) 50 MG tablet; Take 1 tablet (50 mg total) by mouth daily.  Dispense: 30 tablet; Refill: 0  3. Urinary tract infection with hematuria, site unspecified  - Urine culture Not current symptoms but has recurrent UTI and recently treated, follow up with Urologist is in July

## 2015-10-01 DIAGNOSIS — N390 Urinary tract infection, site not specified: Secondary | ICD-10-CM | POA: Diagnosis not present

## 2015-10-01 DIAGNOSIS — R319 Hematuria, unspecified: Secondary | ICD-10-CM | POA: Diagnosis not present

## 2015-10-04 ENCOUNTER — Telehealth: Payer: Self-pay

## 2015-10-04 ENCOUNTER — Ambulatory Visit: Payer: Medicare Other | Admitting: Family Medicine

## 2015-10-04 DIAGNOSIS — R262 Difficulty in walking, not elsewhere classified: Secondary | ICD-10-CM | POA: Diagnosis not present

## 2015-10-04 DIAGNOSIS — N390 Urinary tract infection, site not specified: Secondary | ICD-10-CM

## 2015-10-04 DIAGNOSIS — M6281 Muscle weakness (generalized): Secondary | ICD-10-CM | POA: Diagnosis not present

## 2015-10-04 LAB — URINE CULTURE

## 2015-10-04 NOTE — Telephone Encounter (Signed)
-----   Message from Nori Riis, PA-C sent at 10/04/2015 12:09 AM EDT ----- Please have the patient return for a catheter specimen for UA and culture.

## 2015-10-04 NOTE — Telephone Encounter (Signed)
Spoke with [place], pt caregiver, in reference to pt needing a cath specimen. Pt will RTC Thursday for cath specimen. Orders placed.

## 2015-10-07 ENCOUNTER — Ambulatory Visit: Payer: Self-pay

## 2015-10-11 DIAGNOSIS — M6281 Muscle weakness (generalized): Secondary | ICD-10-CM | POA: Diagnosis not present

## 2015-10-11 DIAGNOSIS — R262 Difficulty in walking, not elsewhere classified: Secondary | ICD-10-CM | POA: Diagnosis not present

## 2015-10-22 DIAGNOSIS — M6281 Muscle weakness (generalized): Secondary | ICD-10-CM | POA: Diagnosis not present

## 2015-10-22 DIAGNOSIS — R262 Difficulty in walking, not elsewhere classified: Secondary | ICD-10-CM | POA: Diagnosis not present

## 2015-10-23 DIAGNOSIS — S8001XA Contusion of right knee, initial encounter: Secondary | ICD-10-CM | POA: Diagnosis not present

## 2015-10-25 ENCOUNTER — Other Ambulatory Visit (INDEPENDENT_AMBULATORY_CARE_PROVIDER_SITE_OTHER): Payer: Medicare Other

## 2015-10-25 ENCOUNTER — Other Ambulatory Visit: Payer: Self-pay

## 2015-10-25 DIAGNOSIS — N390 Urinary tract infection, site not specified: Secondary | ICD-10-CM | POA: Diagnosis not present

## 2015-10-25 DIAGNOSIS — Z8546 Personal history of malignant neoplasm of prostate: Secondary | ICD-10-CM | POA: Diagnosis not present

## 2015-10-25 LAB — MICROSCOPIC EXAMINATION: EPITHELIAL CELLS (NON RENAL): NONE SEEN /HPF (ref 0–10)

## 2015-10-25 LAB — URINALYSIS, COMPLETE
Bilirubin, UA: NEGATIVE
GLUCOSE, UA: NEGATIVE
Ketones, UA: NEGATIVE
NITRITE UA: NEGATIVE
PH UA: 7 (ref 5.0–7.5)
Protein, UA: NEGATIVE
Specific Gravity, UA: 1.01 (ref 1.005–1.030)
Urobilinogen, Ur: 0.2 mg/dL (ref 0.2–1.0)

## 2015-10-25 NOTE — Progress Notes (Signed)
In and Out Catheterization  Patient is present today for a I & O catheterization due to possible UTI/urine recheck. Patient was cleaned and prepped in a sterile fashion with betadine and Lidocaine 2% jelly was instilled into the urethra.  A 14FR cath was inserted no complications were noted , 218ml of urine return was noted, urine was yellow in color. A clean urine sample was collected for u/a and cx. Bladder was drained  And catheter was removed with out difficulty.    Preformed by: Toniann Fail, LPN   Follow up/ Additional notes: pt will f/u Friday with [facility] for labs and ucx results.

## 2015-10-26 LAB — PSA

## 2015-10-27 LAB — CULTURE, URINE COMPREHENSIVE

## 2015-10-29 ENCOUNTER — Telehealth: Payer: Self-pay | Admitting: Urology

## 2015-10-29 ENCOUNTER — Telehealth: Payer: Self-pay

## 2015-10-29 ENCOUNTER — Ambulatory Visit (INDEPENDENT_AMBULATORY_CARE_PROVIDER_SITE_OTHER): Payer: Medicare Other | Admitting: Urology

## 2015-10-29 ENCOUNTER — Encounter: Payer: Self-pay | Admitting: Urology

## 2015-10-29 VITALS — BP 123/74 | HR 57 | Resp 18 | Ht 64.0 in

## 2015-10-29 DIAGNOSIS — C61 Malignant neoplasm of prostate: Secondary | ICD-10-CM

## 2015-10-29 DIAGNOSIS — N390 Urinary tract infection, site not specified: Secondary | ICD-10-CM | POA: Diagnosis not present

## 2015-10-29 MED ORDER — AMOXICILLIN-POT CLAVULANATE 875-125 MG PO TABS: 1.0000 | ORAL_TABLET | Freq: Two times a day (BID) | ORAL | Status: DC

## 2015-10-29 MED ORDER — VITAMIN C 500 MG PO TABS: 500.0000 mg | ORAL_TABLET | Freq: Two times a day (BID) | ORAL | Status: DC

## 2015-10-29 MED ORDER — CRANBERRY 125 MG PO TABS: 1.0000 | ORAL_TABLET | Freq: Two times a day (BID) | ORAL | Status: DC

## 2015-10-29 NOTE — Telephone Encounter (Signed)
Spoke with [name] at [facility] about the rx for cranberry  125mg  bid. They only have 425mg  and patient needs it to be an rx for the home he is in. Per Larene [place] ok to fill at 425 mg. take once daily.

## 2015-10-29 NOTE — Telephone Encounter (Signed)
Pt was seen in the office today.  

## 2015-10-29 NOTE — Patient Instructions (Signed)
                                             Urinary Tract Infection Prevention Patient Education Stay Hydrated: Urinary tract infections (UTIs) are less likely to occur in someone who is drinking enough water to promote regular urination, so it is very important to stay hydrated in order to help flush out bacteria from the urinary tract. Respond to "Nature's Call": It is always a good idea to urinate as soon as you feel the need. While "holding it in" does not directly cause an infection, it can cause overdistension that can damage the lining of the bladder, making it more vulnerable to bacteria.  Practice Proper Bathroom Hygiene: To keep bacteria near the urethral opening to a minimum, it is important to practice proper wiping techniques (i.e. front to back wiping) to help prevent rectal bacteria from entering the uretro-genital area. It can also be helpful to take showers and avoid soaking in the bathtub.  Take a Vitamin C Supplement: About 1,000 milligrams of vitamin C taken daily can help inhibit the growth of some bacteria by acidifying the urine. Maintain Control with Cranberries: Cranberries contain hippuronic acid, which is a natural antiseptic that may help prevent the adherence of bacteria to the bladder lining. Drinking 100% pure cranberry juice or taking over the counter cranberry supplements twice daily may help to prevent an infection. However, it is important to note that cranberry juices/supplements are not helpful once a urinary tract infection (UTI) is present. Strengthen Your Core: Often, a lazy bladder (unable to empty urine properly) occurs due to lower back problem, so consider doing exercises to help strengthen your back, pelvic floor, and stomach muscles.  Pay Attention to Your Urine: Your urine can change color for a variety of reasons, including from the medications you take, so pay close attention to it to monitor your overall health. One key thing to note is that if your urine  is typically a darker yellow, your body is dehydrated, so you need to step up your water intake.

## 2015-10-29 NOTE — Progress Notes (Signed)
9:03 AM   [name] Mar 11, 1941 [id number]  Referring provider: No referring provider defined for this encounter.  Chief Complaint  Patient presents with  . Recurrent UTI    6 month follow up  . Prostate Cancer    HPI: Patient is a 75 year old WM with mental disabilities who presents today for a recheck on recurrent UTI's and prostate cancer.    Recurrent UTI Patient had been seen twice in a month for UTI and had an episode of gross hematuria who is referred by his primary care physician Dr. Ancil Boozer for further evaluation and management.  Patient's caregivers had noticed episodes of gross hematuria and carried the patient to the emergency room on 08/21/2015.  That time, [name] was not complaining of pain.  He was afebrile and vital signs were normal in the emergency department.  His UA was suspicious for infection.  He was given a dose of IV Rocephin and discharged with Keflex and instructed to follow-up with his PCP.  Urine culture from that visit did grow out Escherichia coli which was sensitive to the Rocephin and Keflex.  [name] urine did not have blood or order after the antibiotics were initiated.  He was seen by his PCP on 09/03/2015.  His vital signs were stable at that visit and he was afebrile.  UA dipstick was clear. His urine was sent for culture.  His urine culture grew out mixed urogenital flora and he was referred to [place] for further evaluation.  A catheterized specimen obtained in the office was positive for E.coli.  Today, his care given states he is having urgency.  There has not been further gross hematuria.  Prostate cancer Patient was diagnosed with PCa ( T1 (c), N0 and M0 ) Gleason score 3 + 4 adenocarcinoma involving the left side of the prostate of the prostate. Tumor markers include PSA level of 4.1 ng/mL) . He underwent brachytherapy on 11/10/2008. His most recent PSA was <0.1 ng/mL on 10/25/2015.  IPSS 10/2.        IPSS      10/29/15 0800         International Prostate Symptom Score   How often have you had the sensation of not emptying your bladder? Not at All     How often have you had to urinate less than every two hours? Almost always     How often have you found you stopped and started again several times when you urinated? Not at All     How often have you found it difficult to postpone urination? Almost always     How often have you had a weak urinary stream? Not at All     How often have you had to strain to start urination? Not at All     How many times did you typically get up at night to urinate? None     Total IPSS Score 10     Quality of Life due to urinary symptoms   If you were to spend the rest of your life with your urinary condition just the way it is now how would you feel about that? Mostly Satisfied        Score:  1-7 Mild 8-19 Moderate 20-35 Severe  PMH: Past Medical History  Diagnosis Date  . Renal cyst   . Exotropia   . Lymphedema   . Venous insufficiency   . Wheelchair dependence   . Cerebral palsy ([place])   . Eczema   .  Vitamin D deficiency   . Atrophy, disuse, muscle   . Hyperlipidemia   . Mental retardation   . Hematuria, gross   . BPH (benign prostatic hyperplasia)   . Lower urinary tract symptoms   . Incontinence   . Elevated PSA   . Over weight   . Urinary frequency   . Prostate cancer ([place])   . Urinary urgency     Surgical History: Past Surgical History  Procedure Laterality Date  . Leg circulation surgery Right   . Prostate surgery  11/10/2008    BRACHYTHERAPY    Home Medications:    Medication List       This list is accurate as of: 10/29/15  9:03 AM.  Always use your most recent med list.               alfuzosin 10 MG 24 hr tablet  Commonly known as:  UROXATRAL  Take 1 tablet (10 mg total) by mouth daily with breakfast.     ALPRAZolam 0.25 MG tablet  Commonly known as:  XANAX  Take 1 tablet (0.25 mg total) by mouth 2 (two) times daily as needed for  anxiety.     amoxicillin-clavulanate 875-125 MG tablet  Commonly known as:  AUGMENTIN  Take 1 tablet by mouth every 12 (twelve) hours.     aspirin 81 MG tablet  Take 1 tablet (81 mg total) by mouth daily.     betamethasone dipropionate 0.05 % cream  Commonly known as:  DIPROLENE  Apply topically 2 (two) times daily.     Calcium-Vitamin D3 500-400 MG-UNIT Tabs  Take 500 Units by mouth 2 (two) times daily.     cloNIDine 0.1 MG tablet  Commonly known as:  CATAPRES  Take 1 tablet (0.1 mg total) by mouth 3 (three) times daily.     Cranberry 125 MG Tabs  Take 1 tablet by mouth 2 (two) times daily.     DIPROLENE AF 0.05 % cream  Generic drug:  augmented betamethasone dipropionate  Reported on 09/08/2015     ibuprofen 800 MG tablet  Commonly known as:  ADVIL,MOTRIN  Take 1 tablet (800 mg total) by mouth every 8 (eight) hours as needed.     Medical Compression Stockings Misc  1 Units by Does not apply route daily.     sertraline 50 MG tablet  Commonly known as:  ZOLOFT  Take 1 tablet (50 mg total) by mouth daily.     vitamin C 500 MG tablet  Commonly known as:  ASCORBIC ACID  Take 1 tablet (500 mg total) by mouth 2 (two) times daily.        Allergies: No Known Allergies  Family History: Family History  Problem Relation Age of Onset  . Heart attack Brother   . Heart disease      Social History:  reports that he has never smoked. He has never used smokeless tobacco. He reports that he does not drink alcohol or use illicit drugs.  ROS: UROLOGY Frequent Urination?: Yes Hard to postpone urination?: Yes Burning/pain with urination?: No Get up at night to urinate?: No Leakage of urine?: No Urine stream starts and stops?: No Trouble starting stream?: No Do you have to strain to urinate?: No Blood in urine?: No Urinary tract infection?: No Sexually transmitted disease?: No Injury to kidneys or bladder?: No Painful intercourse?: No Weak stream?: No Erection  problems?: No Penile pain?: No  Gastrointestinal Nausea?: No Vomiting?: No Indigestion/heartburn?: No Diarrhea?: No Constipation?: No  Constitutional Fever: No Night sweats?: No Weight loss?: No Fatigue?: No  Skin Skin rash/lesions?: No Itching?: No  Eyes Blurred vision?: No Double vision?: No  Ears/Nose/Throat Sore throat?: No Sinus problems?: No  Hematologic/Lymphatic Swollen glands?: No Easy bruising?: No  Cardiovascular Leg swelling?: No Chest pain?: No  Respiratory Cough?: No Shortness of breath?: No  Endocrine Excessive thirst?: No  Musculoskeletal Back pain?: No Joint pain?: No  Neurological Headaches?: No Dizziness?: No  Psychologic Depression?: No Anxiety?: No  Physical Exam: BP 123/74 mmHg  Pulse 57  Resp 18  Ht 5\' 4"  (1.626 m)  Constitutional: Well nourished. Alert and oriented, No acute distress. HEENT: [place] AT, moist mucus membranes. Trachea midline, no masses. Cardiovascular: No clubbing, cyanosis, or edema. Respiratory: Normal respiratory effort, no increased work of breathing. GI: Abdomen is soft, non tender, non distended, no abdominal masses. Liver and spleen not palpable.  No hernias appreciated.  Stool sample for occult testing is not indicated.   GU: No CVA tenderness.  No bladder fullness or masses.  Patient with uncircumcised phallus. Foreskin easily retracted Urethral meatus is patent.  No penile discharge. No penile lesions or rashes. Scrotum without lesions, cysts, rashes and/or edema.  Testicles are located scrotally bilaterally. No masses are appreciated in the testicles. Left and right epididymis are normal. Rectal: Patient with  normal sphincter tone. Anus and perineum without scarring or rashes. No rectal masses are appreciated. Prostate is approximately 35 grams, no nodules are appreciated. Seminal vesicles are normal. Skin: No rashes, bruises or suspicious lesions. Lymph: No cervical or inguinal  adenopathy. Neurologic: Grossly intact, no focal deficits, moving all 4 extremities. Psychiatric: Normal mood and affect.  Laboratory Data: Lab Results  Component Value Date   WBC 7.9 08/21/2015   HGB 15.1 08/21/2015   HCT 42.7 08/21/2015   MCV 93.4 08/21/2015   PLT 172 08/21/2015    Lab Results  Component Value Date   CREATININE 0.90 08/21/2015     Lab Results  Component Value Date   HGBA1C 4.9 02/11/2015    Lab Results  Component Value Date   TSH 3.010 02/11/2015       Component Value Date/Time   CHOL 130 06/23/2013   HDL 50 06/23/2013   LDLCALC 66 06/23/2013    Lab Results  Component Value Date   AST 15 02/11/2015   Lab Results  Component Value Date   ALT 11 02/11/2015   PSA History <0.1 ng/mL on 10/30/2014  <0/1 ng/mL on 10/25/2015   Assessment & Plan:   1. Recurrent UTI:   Patient started on Augmentin 875/125 today.  If staff notice no improvement in his urgency or gross hematuria returns, we will recheck the urine.  Patient is instructed to increase his water intake until the urine is pale yellow or clear.  I have advised him to take probiotics (yogurt or oral pills), take cranberry pills or drink the juice.  He is to take Vitamin C 1,000 mg daily to acidify the urine.  Because of his history of recurrent UTIs, I have asked the patient to contact our office if he should experience symptoms of urinary tract infection so that we can CATH him for an urine specimen for urinalysis and culture. This is to prevent a skin contaminant from showing up in the urine culture.  If he should have her symptoms after hours or cannot get to our office, he should notify his other providers that he needs a catheterized specimen for UA and culture.   I  reviewed the symptoms of a urinary tract infection, such as a worsening of urinary urgency and frequency, dysuria, which is painful urination and not the pain of urine hitting sensitive perineal skin, hematuria,  foul-smelling urine, suprapubic pain or mental status changes. Fevers, chills, nausea and or vomiting can also be signs of a possible UTI.  Positive urinalyses and positive urine cultures that are not associated with urinary symptoms should not be treated with antibiotics.   I explained to the patient that being exposed to unnecessary antibiotics can put him at risk for increasing resistance of the bacteria to antibiotics, C. difficile and the side effects of the antibiotics.    2. UTI:   Patient with E.coli infection.  Started on Augmentin.  Follow up only for symptoms.  See above.                                 3. Prostate cancer: Patient was diagnosed with a ( T1 (c), N0 and M0 ) Gleason score 3 + 4 adenocarcinoma involving the left side of the prostate of the prostate. Tumor markers include PSA level of 4.1 ng/mL. Current diagnosis was determined by transrectal ultrasound and needle biopsy of the prostate (completed on 08/27/2008). Treatment has included brachytherapy (completed on 11/10/2008). His last PSA was <0.1 ng/mL on 10/25/2015. He will RTC in July 2018 for IPSS, exam and PSA.  Return in about 6 months (around 04/30/2016) for IPSS, PSA and exam.  These notes generated with voice recognition software. I apologize for typographical errors.  Zara Council, [facility] [address] [place], [place] [address] [phone]

## 2015-10-29 NOTE — Telephone Encounter (Signed)
Amber from pharmacy called and wants to know about the dosage for cranberry pills.  Please give her a call [phone]

## 2015-10-29 NOTE — Telephone Encounter (Signed)
-----   Message from Nori Riis, PA-C sent at 10/27/2015  5:19 PM EDT ----- Please start patient on Augmentin 875/125, one tablet twice daily for 7 days.

## 2015-11-01 ENCOUNTER — Other Ambulatory Visit: Payer: Self-pay

## 2015-11-01 DIAGNOSIS — R262 Difficulty in walking, not elsewhere classified: Secondary | ICD-10-CM | POA: Diagnosis not present

## 2015-11-01 DIAGNOSIS — F320 Major depressive disorder, single episode, mild: Secondary | ICD-10-CM

## 2015-11-01 DIAGNOSIS — Z634 Disappearance and death of family member: Principal | ICD-10-CM

## 2015-11-01 DIAGNOSIS — F4329 Adjustment disorder with other symptoms: Secondary | ICD-10-CM

## 2015-11-01 DIAGNOSIS — M6281 Muscle weakness (generalized): Secondary | ICD-10-CM | POA: Diagnosis not present

## 2015-11-01 DIAGNOSIS — F4321 Adjustment disorder with depressed mood: Secondary | ICD-10-CM

## 2015-11-01 NOTE — Telephone Encounter (Signed)
Patient requesting refill. 

## 2015-11-02 MED ORDER — SERTRALINE HCL 50 MG PO TABS
50.0000 mg | ORAL_TABLET | Freq: Every day | ORAL | Status: DC
Start: 2015-11-02 — End: 2015-11-05

## 2015-11-05 ENCOUNTER — Other Ambulatory Visit: Payer: Self-pay

## 2015-11-05 DIAGNOSIS — F4329 Adjustment disorder with other symptoms: Secondary | ICD-10-CM

## 2015-11-05 DIAGNOSIS — Z634 Disappearance and death of family member: Principal | ICD-10-CM

## 2015-11-05 DIAGNOSIS — F320 Major depressive disorder, single episode, mild: Secondary | ICD-10-CM

## 2015-11-05 DIAGNOSIS — F4321 Adjustment disorder with depressed mood: Secondary | ICD-10-CM

## 2015-11-05 MED ORDER — SERTRALINE HCL 50 MG PO TABS: 50.0000 mg | ORAL_TABLET | Freq: Every day | ORAL | Status: DC

## 2015-11-05 NOTE — Telephone Encounter (Signed)
Got [facility] from [facility] requesting a refill of this patient's Zoloft 50mg .  Refill request was sent to Dr. Steele Sizer for approval and submission.

## 2015-11-08 DIAGNOSIS — R262 Difficulty in walking, not elsewhere classified: Secondary | ICD-10-CM | POA: Diagnosis not present

## 2015-11-08 DIAGNOSIS — M6281 Muscle weakness (generalized): Secondary | ICD-10-CM | POA: Diagnosis not present

## 2015-11-19 ENCOUNTER — Encounter: Payer: Self-pay | Admitting: Family Medicine

## 2015-11-19 ENCOUNTER — Ambulatory Visit (INDEPENDENT_AMBULATORY_CARE_PROVIDER_SITE_OTHER): Payer: Medicare Other | Admitting: Family Medicine

## 2015-11-19 ENCOUNTER — Telehealth: Payer: Self-pay

## 2015-11-19 VITALS — BP 120/72 | HR 57 | Temp 97.3°F | Resp 18 | Ht 64.0 in | Wt 159.0 lb

## 2015-11-19 DIAGNOSIS — N390 Urinary tract infection, site not specified: Secondary | ICD-10-CM

## 2015-11-19 DIAGNOSIS — F4329 Adjustment disorder with other symptoms: Secondary | ICD-10-CM | POA: Insufficient documentation

## 2015-11-19 DIAGNOSIS — K5909 Other constipation: Secondary | ICD-10-CM

## 2015-11-19 DIAGNOSIS — K5900 Constipation, unspecified: Secondary | ICD-10-CM

## 2015-11-19 DIAGNOSIS — Z634 Disappearance and death of family member: Principal | ICD-10-CM

## 2015-11-19 DIAGNOSIS — F4321 Adjustment disorder with depressed mood: Secondary | ICD-10-CM

## 2015-11-19 DIAGNOSIS — F320 Major depressive disorder, single episode, mild: Secondary | ICD-10-CM | POA: Insufficient documentation

## 2015-11-19 MED ORDER — POLYETHYLENE GLYCOL 3350 17 GM/SCOOP PO POWD: 17.0000 g | Freq: Every day | ORAL | 5 refills | Status: DC

## 2015-11-19 MED ORDER — SERTRALINE HCL 50 MG PO TABS
50.0000 mg | ORAL_TABLET | Freq: Every day | ORAL | 2 refills | Status: DC
Start: 2015-11-19 — End: 2016-02-17

## 2015-11-19 NOTE — Telephone Encounter (Signed)
On order form during patient visit Dr. Ancil Boozer wrote for patient to take OTC Dry Eyes and due to living in group home they are unable to accept this. Please fax in a written order for a prescription eye drop for patient at pharmacy. Thanks

## 2015-11-19 NOTE — Progress Notes (Signed)
Name: [name]   MRN: [id number]    DOB: 24-Dec-1940   Date:11/19/2015       Progress Note  Subjective  Chief Complaint  Chief Complaint  Patient presents with  . Medication Refill    3 month F/U  . Depression    Patient caregiver is stressed out and aggraviated more in the morning, but after eating breakfast he seems a little less stressed out. Medication is controlling symptoms  . Eye Problem    Patient states both of his eyes have been dry and irritated, would like them checked out.    . Constipation    HPI  Depression Mild /Grieving: he is doing better since started on Zoloft about one month ago. He has great family support, but was grieving the loss of his sister, but he has another sister - [name] - that calls him daily. No longer withdrawn from the group, normal appetite, sleeping well.   Chronic constipation: caregiver [name] ) came in with him, she states he has a long history of constipation and he drinks prune juice and also has fiber bars but still has Bristol scale 1 twice daily and has to strain with bowel movements, no blood in stools. We will try Miralax  Recurrent UTI: with positive E.coli culture multiple times. He is a colonizer, urine odor resolved and no longer has hematuria. 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   Dry eyes: he has been complaining of dry eyes for the past couple of months, no pain or drainage. He is due for an eye exam, advised to try clear tear drops in the meantime   Patient Active Problem List   Diagnosis Date Noted  . Chronic constipation 11/19/2015  . Mild major depression ([place]) 11/19/2015  . Complicated grieving 0000000  . Recurrent UTI 09/11/2015  . Adenocarcinoma of prostate ([place]) 03/02/2015  . Amyotrophia 03/02/2015  . Edema leg 03/02/2015  . Cerebral palsy ([place]) 03/02/2015  . Dyslipidemia 03/02/2015  . Dermatitis, eczematoid 03/02/2015  . 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 H/O acute poliomyelitis 03/02/2015  .  Acquired lymphedema 03/02/2015  . Mental retardation 03/02/2015  . Hypertrophy of nail 03/02/2015  . Adiposity 03/02/2015  . Absence of bladder continence 03/02/2015  . Chronic venous insufficiency 03/02/2015  . Avitaminosis D 03/02/2015  . Adynamia 03/02/2015  . Dependent on wheelchair 03/02/2015  . Kidney cysts 03/02/2015  . At risk for falling 03/02/2015  . Cerebral vascular disease 03/02/2015  . Strabismus 02/11/2015  . Urge incontinence of urine 02/11/2015  . Wheelchair dependence 02/11/2015  . Obesity 02/11/2015  . Prostate cancer ([place]) 10/30/2014  . Urinary frequency 10/30/2014    Past Surgical History:  Procedure Laterality Date  . leg circulation surgery Right   . PROSTATE SURGERY  11/10/2008   BRACHYTHERAPY    Family History  Problem Relation Age of Onset  . Heart attack Brother   . Heart disease      Social History   Social History  . Marital status: Single    Spouse name: N/A  . Number of children: N/A  . Years of education: N/A   Occupational History  . Not on file.   Social History Main Topics  . Smoking status: Never Smoker  . Smokeless tobacco: Never Used  . Alcohol use No  . Drug use: No  . Sexual activity: No   Other Topics Concern  . Not on file   Social History Narrative   Lives at [facility] group home, 5  males.          Current Outpatient Prescriptions:  .  alfuzosin (UROXATRAL) 10 MG 24 hr tablet, Take 1 tablet (10 mg total) by mouth daily with breakfast., Disp: 90 tablet, Rfl: 3 .  ALPRAZolam (XANAX) 0.25 MG tablet, Take 1 tablet (0.25 mg total) by mouth 2 (two) times daily as needed for anxiety., Disp: 20 tablet, Rfl: 0 .  aspirin 81 MG tablet, Take 1 tablet (81 mg total) by mouth daily., Disp: 30 tablet, Rfl: 12 .  augmented betamethasone dipropionate (DIPROLENE AF) 0.05 % cream, Reported on 09/08/2015, Disp: , Rfl:  .  betamethasone dipropionate (DIPROLENE) 0.05 % cream, Apply topically 2 (two) times daily., Disp: 90 g, Rfl:  1 .  Calcium Carb-Cholecalciferol (CALCIUM-VITAMIN D3) 500-400 MG-UNIT TABS, Take 500 Units by mouth 2 (two) times daily. , Disp: , Rfl:  .  cloNIDine (CATAPRES) 0.1 MG tablet, Take 1 tablet (0.1 mg total) by mouth 3 (three) times daily., Disp: 30 tablet, Rfl: 0 .  Cranberry 125 MG TABS, Take 1 tablet by mouth 2 (two) times daily., Disp: 60 tablet, Rfl: 12 .  Elastic Bandages & Supports (MEDICAL COMPRESSION STOCKINGS) MISC, 1 Units by Does not apply route daily., Disp: 2 each, Rfl: 0 .  ibuprofen (ADVIL,MOTRIN) 800 MG tablet, Take 1 tablet (800 mg total) by mouth every 8 (eight) hours as needed., Disp: 30 tablet, Rfl: 0 .  sertraline (ZOLOFT) 50 MG tablet, Take 1 tablet (50 mg total) by mouth daily., Disp: 30 tablet, Rfl: 2 .  vitamin C (ASCORBIC ACID) 500 MG tablet, Take 1 tablet (500 mg total) by mouth 2 (two) times daily., Disp: 60 tablet, Rfl: 12 .  polyethylene glycol powder (GLYCOLAX/MIRALAX) powder, Take 17 g by mouth daily., Disp: 3350 g, Rfl: 5  No Known Allergies   ROS  Ten systems reviewed and is negative except as mentioned in HPI   Objective  Vitals:   11/19/15 0948  BP: 120/72  Pulse: (!) 57  Resp: 18  Temp: 97.3 F (36.3 C)  TempSrc: Oral  SpO2: 97%  Weight: 159 lb (72.1 kg)  Height: 5\' 4"  (1.626 m)    Body mass index is 27.29 kg/m.  Physical Exam  Constitutional: Patient appears well-developed and well-nourished. Obese  No distress. Sitting on wheelchair.  HEENT: head atraumatic, normocephalic,neck supple, throat within normal limits Cardiovascular: Normal rate, regular rhythm and normal heart sounds.  No murmur heard. Trace BLE ankle  edema. Pulmonary/Chest: Effort normal and breath sounds normal. No respiratory distress. Abdominal: Soft.  There is no tenderness. Psychiatric: Patient has a normal mood during his visit. Follows simple commands, hard to understand his speech but able to communicate verabally  Recent Results (from the past 2160 hour(s))   Urine culture     Status: None   Collection Time: 09/03/15 12:00 AM  Result Value Ref Range   Urine Culture, Routine Final report    Urine Culture result 1 Comment     Comment: Mixed urogenital flora Greater than 100,000 colony forming units per mL   POCT Urinalysis Dipstick     Status: Normal   Collection Time: 09/03/15 10:42 AM  Result Value Ref Range   Color, UA light yellow    Clarity, UA clear    Glucose, UA neg    Bilirubin, UA neg    Ketones, UA neg    Spec Grav, UA 1.020    Blood, UA neg    pH, UA 6.0    Protein, UA neg  Urobilinogen, UA negative    Nitrite, UA neg    Leukocytes, UA Negative Negative  Urinalysis, Complete     Status: Abnormal   Collection Time: 09/08/15  2:03 PM  Result Value Ref Range   Specific Gravity, UA 1.020 1.005 - 1.030   pH, UA 5.0 5.0 - 7.5   Color, UA Yellow Yellow   Appearance Ur Cloudy (A) Clear   Leukocytes, UA 3+ (A) Negative   Protein, UA Negative Negative/Trace   Glucose, UA Negative Negative   Ketones, UA Negative Negative   RBC, UA 2+ (A) Negative   Bilirubin, UA Negative Negative   Urobilinogen, Ur 0.2 0.2 - 1.0 mg/dL   Nitrite, UA Positive (A) Negative   Microscopic Examination See below:   Microscopic Examination     Status: Abnormal   Collection Time: 09/08/15  2:03 PM  Result Value Ref Range   WBC, UA >30 (H) 0 - 5 /hpf   RBC, UA None seen 0 - 2 /hpf   Epithelial Cells (non renal) 0-10 0 - 10 /hpf   Bacteria, UA Many (A) None seen/Few  CULTURE, URINE COMPREHENSIVE     Status: Abnormal   Collection Time: 09/08/15  2:22 PM  Result Value Ref Range   Urine Culture, Comprehensive Final report (A)    Result 1 Escherichia coli (A)     Comment: Greater than 100,000 colony forming units per mL   ANTIMICROBIAL SUSCEPTIBILITY Comment     Comment:       ** S = Susceptible; I = Intermediate; R = Resistant **                    P = Positive; N = Negative             MICS are expressed in micrograms per mL    Antibiotic                  RSLT#1    RSLT#2    RSLT#3    RSLT#4 Amoxicillin/Clavulanic Acid    S Ampicillin                     S Cefepime                       S Ceftriaxone                    S Cefuroxime                     S Cephalothin                    S Ciprofloxacin                  S Ertapenem                      S Gentamicin                     S Imipenem                       S Levofloxacin                   S Nitrofurantoin                 S Piperacillin  S Tetracycline                   S Tobramycin                     S Trimethoprim/Sulfa             S   BLADDER SCAN AMB NON-IMAGING     Status: None   Collection Time: 09/08/15  2:30 PM  Result Value Ref Range   Scan Result 129   Urine culture     Status: Abnormal   Collection Time: 10/01/15 12:00 AM  Result Value Ref Range   Urine Culture, Routine Final report (A)    Urine Culture result 1 Escherichia coli (A)     Comment: Greater than 100,000 colony forming units per mL   ANTIMICROBIAL SUSCEPTIBILITY Comment     Comment:       ** S = Susceptible; I = Intermediate; R = Resistant **                    P = Positive; N = Negative             MICS are expressed in micrograms per mL    Antibiotic                 RSLT#1    RSLT#2    RSLT#3    RSLT#4 Amoxicillin/Clavulanic Acid    S Ampicillin                     S Cefepime                       S Ceftriaxone                    S Cefuroxime                     I Cephalothin                    I Ciprofloxacin                  S Ertapenem                      S Gentamicin                     S Imipenem                       S Levofloxacin                   S Nitrofurantoin                 S Piperacillin                   S Tetracycline                   S Tobramycin                     S Trimethoprim/Sulfa             S   PSA     Status: None   Collection Time: 10/25/15  8:42 AM  Result Value Ref Range   Prostate Specific Ag, Serum <0.1 0.0 - 4.0 ng/mL     Comment:  Roche ECLIA methodology. According to the American Urological Association, Serum PSA should decrease and remain at undetectable levels after radical prostatectomy. The AUA defines biochemical recurrence as an initial PSA value 0.2 ng/mL or greater followed by a subsequent confirmatory PSA value 0.2 ng/mL or greater. Values obtained with different assay methods or kits cannot be used interchangeably. Results cannot be interpreted as absolute evidence of the presence or absence of malignant disease.   CULTURE, URINE COMPREHENSIVE     Status: Abnormal   Collection Time: 10/25/15  9:25 AM  Result Value Ref Range   Urine Culture, Comprehensive Final report (A)    Result 1 Escherichia coli (A)     Comment: Greater than 100,000 colony forming units per mL   ANTIMICROBIAL SUSCEPTIBILITY Comment     Comment:       ** S = Susceptible; I = Intermediate; R = Resistant **                    P = Positive; N = Negative             MICS are expressed in micrograms per mL    Antibiotic                 RSLT#1    RSLT#2    RSLT#3    RSLT#4 Amoxicillin/Clavulanic Acid    S Ampicillin                     S Cefepime                       S Ceftriaxone                    S Cefuroxime                     S Cephalothin                    S Ciprofloxacin                  S Ertapenem                      S Gentamicin                     S Imipenem                       S Levofloxacin                   S Nitrofurantoin                 S Piperacillin                   S Tetracycline                   S Tobramycin                     S Trimethoprim/Sulfa             S   Urinalysis, Complete     Status: Abnormal   Collection Time: 10/25/15  9:25 AM  Result Value Ref Range   Specific Gravity, UA 1.010 1.005 - 1.030   pH, UA 7.0 5.0 - 7.5   Color, UA Yellow Yellow   Appearance Ur Clear Clear  Leukocytes, UA 1+ (A) Negative   Protein, UA Negative Negative/Trace   Glucose, UA Negative Negative    Ketones, UA Negative Negative   RBC, UA Trace (A) Negative   Bilirubin, UA Negative Negative   Urobilinogen, Ur 0.2 0.2 - 1.0 mg/dL   Nitrite, UA Negative Negative   Microscopic Examination See below:   Microscopic Examination     Status: Abnormal   Collection Time: 10/25/15  9:25 AM  Result Value Ref Range   WBC, UA 11-30 (A) 0 - 5 /hpf   RBC, UA 0-2 0 - 2 /hpf   Epithelial Cells (non renal) None seen 0 - 10 /hpf   Bacteria, UA Few None seen/Few     PHQ2/9: Depression screen [facility] 2/9 11/19/2015 08/19/2015 07/19/2015 03/02/2015 02/11/2015  Decreased Interest 0 0 0 0 0  Down, Depressed, Hopeless 0 1 0 1 0  PHQ - 2 Score 0 1 0 1 0    Fall Risk: Fall Risk  11/19/2015 08/19/2015 07/19/2015 03/02/2015 02/11/2015  Falls in the past year? Yes No No Yes Yes  Number falls in past yr: 1 - - 2 or more 1  Injury with Fall? No - - Yes No  Risk for fall due to : - - - History of fall(s);Impaired balance/gait;Impaired mobility -      Functional Status Survey: Is the patient deaf or have difficulty hearing?: No Does the patient have difficulty seeing, even when wearing glasses/contacts?: Yes Does the patient have difficulty concentrating, remembering, or making decisions?: No Does the patient have difficulty walking or climbing stairs?: Yes (wheelchair bound) Does the patient have difficulty dressing or bathing?: Yes (needs assistance) Does the patient have difficulty doing errands alone such as visiting a doctor's office or shopping?: Yes (patient does not drive)    Assessment & Plan  1. Complicated grieving  - sertraline (ZOLOFT) 50 MG tablet; Take 1 tablet (50 mg total) by mouth daily.  Dispense: 30 tablet; Refill: 2  2. Mild major depression (HCC)  - sertraline (ZOLOFT) 50 MG tablet; Take 1 tablet (50 mg total) by mouth daily.  Dispense: 30 tablet; Refill: 2  3. Chronic constipation  - polyethylene glycol powder (GLYCOLAX/MIRALAX) powder; Take 17 g by mouth daily.  Dispense: 3350  g; Refill: 5  4. Recurrent UTI  Correcting constipation may help with urinary problems.

## 2015-11-21 ENCOUNTER — Other Ambulatory Visit: Payer: Self-pay | Admitting: Family Medicine

## 2015-11-21 MED ORDER — POLYVINYL ALCOHOL-POVIDONE 5-6 MG/ML OP SOLN: 2.0000 [drp] | Freq: Two times a day (BID) | OPHTHALMIC | 2 refills | Status: DC | PRN

## 2015-11-22 DIAGNOSIS — R262 Difficulty in walking, not elsewhere classified: Secondary | ICD-10-CM | POA: Diagnosis not present

## 2015-11-22 DIAGNOSIS — M6281 Muscle weakness (generalized): Secondary | ICD-10-CM | POA: Diagnosis not present

## 2015-11-29 DIAGNOSIS — R262 Difficulty in walking, not elsewhere classified: Secondary | ICD-10-CM | POA: Diagnosis not present

## 2015-11-29 DIAGNOSIS — M6281 Muscle weakness (generalized): Secondary | ICD-10-CM | POA: Diagnosis not present

## 2015-12-06 DIAGNOSIS — R262 Difficulty in walking, not elsewhere classified: Secondary | ICD-10-CM | POA: Diagnosis not present

## 2015-12-06 DIAGNOSIS — M6281 Muscle weakness (generalized): Secondary | ICD-10-CM | POA: Diagnosis not present

## 2015-12-13 DIAGNOSIS — R262 Difficulty in walking, not elsewhere classified: Secondary | ICD-10-CM | POA: Diagnosis not present

## 2015-12-13 DIAGNOSIS — M6281 Muscle weakness (generalized): Secondary | ICD-10-CM | POA: Diagnosis not present

## 2015-12-20 DIAGNOSIS — M6281 Muscle weakness (generalized): Secondary | ICD-10-CM | POA: Diagnosis not present

## 2015-12-20 DIAGNOSIS — R262 Difficulty in walking, not elsewhere classified: Secondary | ICD-10-CM | POA: Diagnosis not present

## 2015-12-22 DIAGNOSIS — S20229A Contusion of unspecified back wall of thorax, initial encounter: Secondary | ICD-10-CM | POA: Diagnosis not present

## 2015-12-23 ENCOUNTER — Encounter: Payer: Self-pay | Admitting: Family Medicine

## 2015-12-23 ENCOUNTER — Emergency Department
Admission: EM | Admit: 2015-12-23 | Discharge: 2015-12-23 | Disposition: A | Discharge status: Home / Self Care | Payer: Medicare Other | Attending: Emergency Medicine | Admitting: Emergency Medicine

## 2015-12-23 ENCOUNTER — Encounter: Payer: Self-pay | Admitting: Emergency Medicine

## 2015-12-23 ENCOUNTER — Ambulatory Visit (INDEPENDENT_AMBULATORY_CARE_PROVIDER_SITE_OTHER): Payer: Medicare Other | Admitting: Family Medicine

## 2015-12-23 VITALS — BP 132/76 | HR 67 | Temp 97.7°F | Resp 16

## 2015-12-23 DIAGNOSIS — Z791 Long term (current) use of non-steroidal anti-inflammatories (NSAID): Secondary | ICD-10-CM | POA: Diagnosis not present

## 2015-12-23 DIAGNOSIS — N390 Urinary tract infection, site not specified: Secondary | ICD-10-CM | POA: Diagnosis not present

## 2015-12-23 DIAGNOSIS — R531 Weakness: Secondary | ICD-10-CM | POA: Insufficient documentation

## 2015-12-23 DIAGNOSIS — Z8546 Personal history of malignant neoplasm of prostate: Secondary | ICD-10-CM | POA: Insufficient documentation

## 2015-12-23 DIAGNOSIS — Z7982 Long term (current) use of aspirin: Secondary | ICD-10-CM | POA: Diagnosis not present

## 2015-12-23 DIAGNOSIS — Z79899 Other long term (current) drug therapy: Secondary | ICD-10-CM | POA: Insufficient documentation

## 2015-12-23 DIAGNOSIS — Z9181 History of falling: Secondary | ICD-10-CM | POA: Diagnosis not present

## 2015-12-23 LAB — URINALYSIS COMPLETE WITH MICROSCOPIC (ARMC ONLY)
BACTERIA UA: NONE SEEN
BILIRUBIN URINE: NEGATIVE
Glucose, UA: NEGATIVE mg/dL
HGB URINE DIPSTICK: NEGATIVE
KETONES UR: NEGATIVE mg/dL
LEUKOCYTES UA: NEGATIVE
NITRITE: NEGATIVE
PH: 7 (ref 5.0–8.0)
PROTEIN: NEGATIVE mg/dL
SPECIFIC GRAVITY, URINE: 1.013 (ref 1.005–1.030)

## 2015-12-23 LAB — CBC
HCT: 41.9 % (ref 40.0–52.0)
HEMOGLOBIN: 14.7 g/dL (ref 13.0–18.0)
MCH: 32.9 pg (ref 26.0–34.0)
MCHC: 35.1 g/dL (ref 32.0–36.0)
MCV: 93.6 fL (ref 80.0–100.0)
Platelets: 149 10*3/uL — ABNORMAL LOW (ref 150–440)
RBC: 4.48 MIL/uL (ref 4.40–5.90)
RDW: 13.1 % (ref 11.5–14.5)
WBC: 5.5 10*3/uL (ref 3.8–10.6)

## 2015-12-23 LAB — BASIC METABOLIC PANEL
Anion gap: 7 (ref 5–15)
BUN: 14 mg/dL (ref 6–20)
CALCIUM: 9 mg/dL (ref 8.9–10.3)
CO2: 26 mmol/L (ref 22–32)
CREATININE: 0.68 mg/dL (ref 0.61–1.24)
Chloride: 105 mmol/L (ref 101–111)
GFR calc Af Amer: >60 mL/min (ref >60)
GLUCOSE: 100 mg/dL — AB (ref 65–99)
Potassium: 3.8 mmol/L (ref 3.5–5.1)
Sodium: 138 mmol/L (ref 135–145)

## 2015-12-23 MED ORDER — GI COCKTAIL ~~LOC~~
30.0000 mL | Freq: Once | ORAL | Status: DC
  Filled 2015-12-23: qty 30

## 2015-12-23 NOTE — ED Provider Notes (Signed)
[facility] Emergency Department Provider Note   ____________________________________________   None    (approximate)  I have reviewed the triage vital signs and the nursing notes.   HISTORY  Chief Complaint Other   HPI DARUS [name] is a 75 y.o. male with a history of mental retardation as well as previous UTI who is presenting for several weeks of worsening weakness. He was seen by his primary care doctor earlier today and a urine culture was sent. However, the patient's caregiver supervisor recommended that they come to the emergency department for urinalysis to make sure that he doesn't have a UTI of this way. Per the patient's caregiver he has been weakening over the past several weeks and is usually able to transfer better but has decreased his capabilities. He is still taking physical therapy every Monday. He has a good appetite and is eating well. The patient has no complaints and says he feels fine.   Past Medical History:  Diagnosis Date  . Atrophy, disuse, muscle   . BPH (benign prostatic hyperplasia)   . Cerebral palsy ([place])   . Eczema   . Elevated PSA   . Exotropia   . Hematuria, gross   . Hyperlipidemia   . Incontinence   . Lower urinary tract symptoms   . Lymphedema   . Mental retardation   . Over weight   . Prostate cancer ([place])   . Renal cyst   . Urinary frequency   . Urinary urgency   . Venous insufficiency   . Vitamin D deficiency   . Wheelchair dependence     Patient Active Problem List   Diagnosis Date Noted  . Chronic constipation 11/19/2015  . Mild major depression ([place]) 11/19/2015  . Complicated grieving 0000000  . Recurrent UTI 09/11/2015  . Adenocarcinoma of prostate ([place]) 03/02/2015  . Amyotrophia 03/02/2015  . Edema leg 03/02/2015  . Cerebral palsy ([place]) 03/02/2015  . Dyslipidemia 03/02/2015  . Dermatitis, eczematoid 03/02/2015  . 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 H/O acute poliomyelitis 03/02/2015  .  Acquired lymphedema 03/02/2015  . Mental retardation 03/02/2015  . Hypertrophy of nail 03/02/2015  . Adiposity 03/02/2015  . Absence of bladder continence 03/02/2015  . Chronic venous insufficiency 03/02/2015  . Avitaminosis D 03/02/2015  . Adynamia 03/02/2015  . Dependent on wheelchair 03/02/2015  . Kidney cysts 03/02/2015  . At risk for falling 03/02/2015  . Cerebral vascular disease 03/02/2015  . Strabismus 02/11/2015  . Urge incontinence of urine 02/11/2015  . Wheelchair dependence 02/11/2015  . Obesity 02/11/2015  . Prostate cancer ([place]) 10/30/2014  . Urinary frequency 10/30/2014    Past Surgical History:  Procedure Laterality Date  . leg circulation surgery Right   . PROSTATE SURGERY  11/10/2008   BRACHYTHERAPY    Prior to Admission medications   Medication Sig Start Date End Date Taking? Authorizing Provider  alfuzosin (UROXATRAL) 10 MG 24 hr tablet Take 1 tablet (10 mg total) by mouth daily with breakfast. 08/11/15   Nori Riis, PA-C  ALPRAZolam (XANAX) 0.25 MG tablet Take 1 tablet (0.25 mg total) by mouth 2 (two) times daily as needed for anxiety. 08/06/15   Steele Sizer, MD  aspirin 81 MG tablet Take 1 tablet (81 mg total) by mouth daily. 06/22/15   Steele Sizer, MD  augmented betamethasone dipropionate (DIPROLENE AF) 0.05 % cream Reported on 09/08/2015    Historical Provider, MD  betamethasone dipropionate (DIPROLENE) 0.05 % cream Apply topically 2 (two) times daily. 02/23/15  Steele Sizer, MD  Calcium Carb-Cholecalciferol (CALCIUM-VITAMIN D3) 500-400 MG-UNIT TABS Take 500 Units by mouth 2 (two) times daily.     Historical Provider, MD  cloNIDine (CATAPRES) 0.1 MG tablet Take 1 tablet (0.1 mg total) by mouth 3 (three) times daily. 08/06/15   Steele Sizer, MD  Cranberry 425 MG CAPS Take 1 capsule by mouth daily.    Historical Provider, MD  Elastic Bandages & Supports (MEDICAL COMPRESSION STOCKINGS) MISC 1 Units by Does not apply route daily. 07/19/15   Steele Sizer, MD  ibuprofen (ADVIL,MOTRIN) 800 MG tablet Take 1 tablet (800 mg total) by mouth every 8 (eight) hours as needed. 08/06/15   Steele Sizer, MD  polyethylene glycol powder (GLYCOLAX/MIRALAX) powder Take 17 g by mouth daily. 11/19/15   Steele Sizer, MD  Polyvinyl Alcohol-Povidone 5-6 MG/ML SOLN Apply 2 drops to eye 2 (two) times daily as needed. 11/21/15   Steele Sizer, MD  sertraline (ZOLOFT) 50 MG tablet Take 1 tablet (50 mg total) by mouth daily. 11/19/15   Steele Sizer, MD  vitamin C (ASCORBIC ACID) 500 MG tablet Take 1 tablet (500 mg total) by mouth 2 (two) times daily. 10/29/15   Nori Riis, PA-C    Allergies Review of patient's allergies indicates no known allergies.  Family History  Problem Relation Age of Onset  . Heart attack Brother   . Heart disease      Social History Social History  Substance Use Topics  . Smoking status: Never Smoker  . Smokeless tobacco: Never Used  . Alcohol use No    Review of Systems Constitutional: No fever/chills Eyes: No visual changes. ENT: No sore throat. Cardiovascular: Denies chest pain. Respiratory: Denies shortness of breath. Gastrointestinal: No abdominal pain.  No nausea, no vomiting.  No diarrhea.  No constipation. Genitourinary: Negative for dysuria. Musculoskeletal: Negative for back pain. Skin: Negative for rash. Neurological: Negative for headaches, focal weakness or numbness.  10-point ROS otherwise negative.  ____________________________________________   PHYSICAL EXAM:  VITAL SIGNS: ED Triage Vitals  Enc Vitals Group     BP 12/23/15 1717 (!) 156/76     Pulse Rate 12/23/15 1717 (!) 55     Resp 12/23/15 1717 18     Temp 12/23/15 1717 97.6 F (36.4 C)     Temp Source 12/23/15 1717 Oral     SpO2 12/23/15 1717 96 %     Weight 12/23/15 1715 200 lb (90.7 kg)     Height 12/23/15 1715 5\' 9"  (1.753 m)     Head Circumference --      Peak Flow --      Pain Score --      Pain Loc --      Pain Edu? --        Excl. in [place]? --     Constitutional: Alert and oriented. Well appearing and in no acute distress. Eyes: Conjunctivae are normal. PERRL. EOMI. Head: Atraumatic. Nose: No congestion/rhinnorhea. Mouth/Throat: Mucous membranes are moist.   Neck: No stridor.   Cardiovascular: Normal rate, regular rhythm. Grossly normal heart sounds.   Respiratory: Normal respiratory effort.  No retractions. Lungs CTAB. Gastrointestinal: Soft and nontender. No distention. No abdominal bruits. No CVA tenderness. Musculoskeletal: No lower extremity tenderness nor edema.  No joint effusions. Neurologic:  Moves all 4 extremities equally. No abnormal facial drooping per the patient's caretaker. Skin:  Skin is warm, dry and intact. No rash noted. Psychiatric: Mood and affect are normal. Speech and behavior are normal.  ____________________________________________  LABS (all labs ordered are listed, but only abnormal results are displayed)  Labs Reviewed  URINALYSIS COMPLETEWITH MICROSCOPIC ([place]) - Abnormal; Notable for the following:       Result Value   Color, Urine YELLOW (*)    APPearance HAZY (*)    Squamous Epithelial / LPF 0-5 (*)    All other components within normal limits  BASIC METABOLIC PANEL - Abnormal; Notable for the following:    Glucose, Bld 100 (*)    All other components within normal limits  CBC - Abnormal; Notable for the following:    Platelets 149 (*)    All other components within normal limits   ____________________________________________  EKG  ED ECG REPORT I, Doran Stabler, the attending physician, personally viewed and interpreted this ECG.   Date: 12/23/2015  EKG Time: 1737  Rate: 62  Rhythm: normal sinus rhythm  Axis: Normal  Intervals:none  ST&T Change: No ST segment elevation or depression. No abnormal T-wave inversion. Scooped T wave morphology in the lateral leads which is unchanged from last EKG done on  08/14/2013.  ____________________________________________  RADIOLOGY   ____________________________________________   PROCEDURES  Procedure(s) performed:   Procedures  Critical Care performed:   ____________________________________________   INITIAL IMPRESSION / ASSESSMENT AND PLAN / ED COURSE  Pertinent labs & imaging results that were available during my care of the patient were reviewed by me and considered in my medical decision making (see chart for details).  Very reassuring workup. Patient's symptoms appear to be chronic and worsening. Does not appear to have acute pathology. No complaints from the patient. Reassuring vital signs as well as lab work. Will continue to follow with his primary care doctor.  Clinical Course     ____________________________________________   FINAL CLINICAL IMPRESSION(S) / ED DIAGNOSES  Weakness.    NEW MEDICATIONS STARTED DURING THIS VISIT:  New Prescriptions   No medications on file     Note:  This document was prepared using Dragon voice recognition software and may include unintentional dictation errors.    Orbie Pyo, MD 12/23/15 [phone]

## 2015-12-23 NOTE — ED Triage Notes (Addendum)
Now pt reports pt does seem weaker than normal like when he has UTI.

## 2015-12-23 NOTE — Progress Notes (Signed)
Name: [name]   MRN: [id number]    DOB: 04-May-1940   Date:12/23/2015       Progress Note  Subjective  Chief Complaint  Chief Complaint  Patient presents with  . Fall    Patient had a fall on Sunday night and fell out of bed. The caregiver doesn't know if it was due to him trying to use his urinal or due to him being restless in bed.   . Follow-up    Patient went to [facility] and was diagnosis was a contusion on his back wall of thorax. But they did not perform any X-rays or blood work. Patient states his left side is still sore and bruised.     HPI  Recent fall: he lives in a group home, and was brought in by his usual day caregiver Holly Bodily ) and she states he fell from his bed on the evening of 12/19/2015. He called for help during the night - but we are not sure if they heard him call for help. She returned to work yesterday and noticed a bruise on his left sub-scapular area and was taken to Urgent care for evaluation. He denies hitting his head. No witness. [name] is worried he may have an UTI again since he has been leaning to his side again, but he has been afebrile , has normal appetite.   Patient Active Problem List   Diagnosis Date Noted  . Chronic constipation 11/19/2015  . Mild major depression ([place]) 11/19/2015  . Complicated grieving 0000000  . Recurrent UTI 09/11/2015  . Adenocarcinoma of prostate ([place]) 03/02/2015  . Amyotrophia 03/02/2015  . Edema leg 03/02/2015  . Cerebral palsy ([place]) 03/02/2015  . Dyslipidemia 03/02/2015  . Dermatitis, eczematoid 03/02/2015  . 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 H/O acute poliomyelitis 03/02/2015  . Acquired lymphedema 03/02/2015  . Mental retardation 03/02/2015  . Hypertrophy of nail 03/02/2015  . Adiposity 03/02/2015  . Absence of bladder continence 03/02/2015  . Chronic venous insufficiency 03/02/2015  . Avitaminosis D 03/02/2015  . Adynamia 03/02/2015  . Dependent on wheelchair 03/02/2015  . Kidney cysts  03/02/2015  . At risk for falling 03/02/2015  . Cerebral vascular disease 03/02/2015  . Strabismus 02/11/2015  . Urge incontinence of urine 02/11/2015  . Wheelchair dependence 02/11/2015  . Obesity 02/11/2015  . Prostate cancer ([place]) 10/30/2014  . Urinary frequency 10/30/2014    Past Surgical History:  Procedure Laterality Date  . leg circulation surgery Right   . PROSTATE SURGERY  11/10/2008   BRACHYTHERAPY    Family History  Problem Relation Age of Onset  . Heart attack Brother   . Heart disease      Social History   Social History  . Marital status: Single    Spouse name: N/A  . Number of children: N/A  . Years of education: N/A   Occupational History  . Not on file.   Social History Main Topics  . Smoking status: Never Smoker  . Smokeless tobacco: Never Used  . Alcohol use No  . Drug use: No  . Sexual activity: No   Other Topics Concern  . Not on file   Social History Narrative   Lives at [facility] group home, 5 males.          Current Outpatient Prescriptions:  .  alfuzosin (UROXATRAL) 10 MG 24 hr tablet, Take 1 tablet (10 mg total) by mouth daily with breakfast., Disp: 90 tablet, Rfl: 3 .  ALPRAZolam (XANAX) 0.25  MG tablet, Take 1 tablet (0.25 mg total) by mouth 2 (two) times daily as needed for anxiety., Disp: 20 tablet, Rfl: 0 .  aspirin 81 MG tablet, Take 1 tablet (81 mg total) by mouth daily., Disp: 30 tablet, Rfl: 12 .  augmented betamethasone dipropionate (DIPROLENE AF) 0.05 % cream, Reported on 09/08/2015, Disp: , Rfl:  .  betamethasone dipropionate (DIPROLENE) 0.05 % cream, Apply topically 2 (two) times daily., Disp: 90 g, Rfl: 1 .  Calcium Carb-Cholecalciferol (CALCIUM-VITAMIN D3) 500-400 MG-UNIT TABS, Take 500 Units by mouth 2 (two) times daily. , Disp: , Rfl:  .  cloNIDine (CATAPRES) 0.1 MG tablet, Take 1 tablet (0.1 mg total) by mouth 3 (three) times daily., Disp: 30 tablet, Rfl: 0 .  Cranberry 425 MG CAPS, Take 1 capsule by mouth daily.,  Disp: , Rfl:  .  Elastic Bandages & Supports (MEDICAL COMPRESSION STOCKINGS) MISC, 1 Units by Does not apply route daily., Disp: 2 each, Rfl: 0 .  ibuprofen (ADVIL,MOTRIN) 800 MG tablet, Take 1 tablet (800 mg total) by mouth every 8 (eight) hours as needed., Disp: 30 tablet, Rfl: 0 .  polyethylene glycol powder (GLYCOLAX/MIRALAX) powder, Take 17 g by mouth daily., Disp: 3350 g, Rfl: 5 .  Polyvinyl Alcohol-Povidone 5-6 MG/ML SOLN, Apply 2 drops to eye 2 (two) times daily as needed., Disp: 1 Bottle, Rfl: 2 .  sertraline (ZOLOFT) 50 MG tablet, Take 1 tablet (50 mg total) by mouth daily., Disp: 30 tablet, Rfl: 2 .  vitamin C (ASCORBIC ACID) 500 MG tablet, Take 1 tablet (500 mg total) by mouth 2 (two) times daily., Disp: 60 tablet, Rfl: 12  No Known Allergies   ROS  Ten systems reviewed and is negative except as mentioned in HPI   Objective  Vitals:   12/23/15 1202  BP: 132/76  Pulse: 67  Resp: 16  Temp: 97.7 F (36.5 C)  TempSrc: Axillary  SpO2: 94%    There is no height or weight on file to calculate BMI.  Physical Exam  Constitutional: Patient appears well-developed and well-nourished. Obese  No distress.  HEENT: head atraumatic,  neck supple, throat within normal limits Cardiovascular: Normal rate, regular rhythm and normal heart sounds.  No murmur heard. Trace BLE edema. Pulmonary/Chest: Effort normal and breath sounds normal. No respiratory distress. Abdominal: Soft.  There is no tenderness. Psychiatric: Awake, alert and cooperative, difficulty communicating, but tries - seems to be his baseline.  Muscular skeletal: round area of bruising right below left scapula, able to abduct and internally rotate left shoulder, he has some spastic movement - and hand atrophy which is chronic.   Recent Results (from the past 2160 hour(s))  Urine culture     Status: Abnormal   Collection Time: 10/01/15 12:00 AM  Result Value Ref Range   Urine Culture, Routine Final report (A)    Urine  Culture result 1 Escherichia coli (A)     Comment: Greater than 100,000 colony forming units per mL   ANTIMICROBIAL SUSCEPTIBILITY Comment     Comment:       ** S = Susceptible; I = Intermediate; R = Resistant **                    P = Positive; N = Negative             MICS are expressed in micrograms per mL    Antibiotic  RSLT#1    RSLT#2    RSLT#3    RSLT#4 Amoxicillin/Clavulanic Acid    S Ampicillin                     S Cefepime                       S Ceftriaxone                    S Cefuroxime                     I Cephalothin                    I Ciprofloxacin                  S Ertapenem                      S Gentamicin                     S Imipenem                       S Levofloxacin                   S Nitrofurantoin                 S Piperacillin                   S Tetracycline                   S Tobramycin                     S Trimethoprim/Sulfa             S   PSA     Status: None   Collection Time: 10/25/15  8:42 AM  Result Value Ref Range   Prostate Specific Ag, Serum <0.1 0.0 - 4.0 ng/mL    Comment: Roche ECLIA methodology. According to the American Urological Association, Serum PSA should decrease and remain at undetectable levels after radical prostatectomy. The AUA defines biochemical recurrence as an initial PSA value 0.2 ng/mL or greater followed by a subsequent confirmatory PSA value 0.2 ng/mL or greater. Values obtained with different assay methods or kits cannot be used interchangeably. Results cannot be interpreted as absolute evidence of the presence or absence of malignant disease.   CULTURE, URINE COMPREHENSIVE     Status: Abnormal   Collection Time: 10/25/15  9:25 AM  Result Value Ref Range   Urine Culture, Comprehensive Final report (A)    Result 1 Escherichia coli (A)     Comment: Greater than 100,000 colony forming units per mL   ANTIMICROBIAL SUSCEPTIBILITY Comment     Comment:       ** S = Susceptible; I =  Intermediate; R = Resistant **                    P = Positive; N = Negative             MICS are expressed in micrograms per mL    Antibiotic                 RSLT#1    RSLT#2    RSLT#3    RSLT#4 Amoxicillin/Clavulanic  Acid    S Ampicillin                     S Cefepime                       S Ceftriaxone                    S Cefuroxime                     S Cephalothin                    S Ciprofloxacin                  S Ertapenem                      S Gentamicin                     S Imipenem                       S Levofloxacin                   S Nitrofurantoin                 S Piperacillin                   S Tetracycline                   S Tobramycin                     S Trimethoprim/Sulfa             S   Urinalysis, Complete     Status: Abnormal   Collection Time: 10/25/15  9:25 AM  Result Value Ref Range   Specific Gravity, UA 1.010 1.005 - 1.030   pH, UA 7.0 5.0 - 7.5   Color, UA Yellow Yellow   Appearance Ur Clear Clear   Leukocytes, UA 1+ (A) Negative   Protein, UA Negative Negative/Trace   Glucose, UA Negative Negative   Ketones, UA Negative Negative   RBC, UA Trace (A) Negative   Bilirubin, UA Negative Negative   Urobilinogen, Ur 0.2 0.2 - 1.0 mg/dL   Nitrite, UA Negative Negative   Microscopic Examination See below:   Microscopic Examination     Status: Abnormal   Collection Time: 10/25/15  9:25 AM  Result Value Ref Range   WBC, UA 11-30 (A) 0 - 5 /hpf   RBC, UA 0-2 0 - 2 /hpf   Epithelial Cells (non renal) None seen 0 - 10 /hpf   Bacteria, UA Few None seen/Few     PHQ2/9: Depression screen [facility] 2/9 12/23/2015 11/19/2015 08/19/2015 07/19/2015 03/02/2015  Decreased Interest 0 0 0 0 0  Down, Depressed, Hopeless 0 0 1 0 1  PHQ - 2 Score 0 0 1 0 1     Fall Risk: Fall Risk  12/23/2015 11/19/2015 08/19/2015 07/19/2015 03/02/2015  Falls in the past year? Yes Yes No No Yes  Number falls in past yr: 2 or more 1 - - 2 or more  Injury with Fall? Yes No - - Yes   Risk Factor Category  High Fall Risk - - - -  Risk for fall due to : History  of fall(s);Impaired balance/gait;Impaired mobility - - - History of fall(s);Impaired balance/gait;Impaired mobility      Assessment & Plan  1. History of recent fall  He has a contusion of chest wall, seems to be moving his arm as usual, and no significance tenderness to palpation, he has PT on Monday and we will wait for PT evaluation to decide if there is any changes and if he needs to have x-ray done.   2. Recurrent UTI  - CULTURE, URINE COMPREHENSIVE

## 2015-12-23 NOTE — ED Triage Notes (Addendum)
Pt fell Sunday per caregiver. Has been seen for this and worked up. He had a urine test at urgent care yesterday and saw pcp today who ordered culture. Caregiver reports her boss told her he needs to come to ED and they need to do a urinalysis and urine culture even though two have been done in past 2 days. Pt denies any pain or complaints.  Pt does have MR. Will send [place] but will wait for MD eval for any further testing.

## 2015-12-23 NOTE — ED Notes (Signed)
See triage note. Patient denies any pain at this time. Denies any complaints at this time.

## 2015-12-26 LAB — CULTURE, URINE COMPREHENSIVE

## 2015-12-27 DIAGNOSIS — M6281 Muscle weakness (generalized): Secondary | ICD-10-CM | POA: Diagnosis not present

## 2015-12-27 DIAGNOSIS — R262 Difficulty in walking, not elsewhere classified: Secondary | ICD-10-CM | POA: Diagnosis not present

## 2015-12-29 ENCOUNTER — Telehealth: Payer: Self-pay

## 2015-12-29 NOTE — Telephone Encounter (Signed)
-----   Message from Nori Riis, PA-C sent at 12/29/2015  8:29 AM EDT ----- Please call the group home and speak to [facility] and let her know that Mr. Farrer is colonized.  If he is not having UTI symptoms, there is no reason to perform an UA or an urine culture.  If Mr. Grassl starts developing fevers, complaining of suprapubic pain, having blood in the urine, losing his appetite or not acting like himself, they need to contact us for a CATH UA.  Mr. Immerman should only have CATH UA's if an infection is suspected.  If it is after hours, Mr. Wielenga should be taken to the ED for a CATH UA and culture.

## 2015-12-29 NOTE — Telephone Encounter (Signed)
[name] returned call. Made aware unless pt is symptomatic no reason to perform a u/a and/or cx. [name] stated that her and her supervisor are very concerned as pt urine is "positive for infection." Reinforced again with Vickii Chafe pt is colonized which means it will always be "positive for infection", but no reason to treat unless pt is having symptoms. [name] voiced understanding.

## 2015-12-29 NOTE — Telephone Encounter (Signed)
LMOM for [name] to return the call.

## 2016-01-05 ENCOUNTER — Other Ambulatory Visit: Payer: Self-pay | Admitting: Family Medicine

## 2016-01-05 ENCOUNTER — Telehealth: Payer: Self-pay | Admitting: Family Medicine

## 2016-01-05 DIAGNOSIS — N390 Urinary tract infection, site not specified: Secondary | ICD-10-CM

## 2016-01-05 NOTE — Telephone Encounter (Signed)
done

## 2016-02-01 DIAGNOSIS — N200 Calculus of kidney: Secondary | ICD-10-CM | POA: Diagnosis not present

## 2016-02-01 DIAGNOSIS — N3020 Other chronic cystitis without hematuria: Secondary | ICD-10-CM | POA: Diagnosis not present

## 2016-02-01 DIAGNOSIS — R339 Retention of urine, unspecified: Secondary | ICD-10-CM | POA: Diagnosis not present

## 2016-02-01 DIAGNOSIS — Z8546 Personal history of malignant neoplasm of prostate: Secondary | ICD-10-CM | POA: Diagnosis not present

## 2016-02-01 DIAGNOSIS — R310 Gross hematuria: Secondary | ICD-10-CM | POA: Diagnosis not present

## 2016-02-01 DIAGNOSIS — N390 Urinary tract infection, site not specified: Secondary | ICD-10-CM | POA: Diagnosis not present

## 2016-02-14 ENCOUNTER — Encounter: Payer: Self-pay | Admitting: Family Medicine

## 2016-02-14 ENCOUNTER — Ambulatory Visit (INDEPENDENT_AMBULATORY_CARE_PROVIDER_SITE_OTHER): Payer: Medicare Other | Admitting: Family Medicine

## 2016-02-14 VITALS — BP 128/78 | HR 65 | Temp 98.1°F | Resp 16 | Ht 69.0 in | Wt 169.1 lb

## 2016-02-14 DIAGNOSIS — Z0000 Encounter for general adult medical examination without abnormal findings: Secondary | ICD-10-CM | POA: Diagnosis not present

## 2016-02-14 DIAGNOSIS — Z23 Encounter for immunization: Secondary | ICD-10-CM | POA: Diagnosis not present

## 2016-02-14 NOTE — Progress Notes (Signed)
Name: [name]   MRN: [id number]    DOB: 09/18/1940   Date:02/14/2016       Progress Note  Subjective  Chief Complaint  Chief Complaint  Patient presents with  . Medicare Wellness    HPI  Functional ability/safety issues: lives in a group home, needs assistance with taking medication, bathing, changing.  Hearing issues: Addressed  Activities of daily living: needs assistance Home safety issues: No Issues  End Of Life Planning: Offered verbal information regarding advanced directives, healthcare power of attorney. Caregiver is unsure  Preventative care, Health maintenance, Preventative health measures discussed.  Preventative screenings discussed today: lab work, colonoscopy - not indicated based on age and history of prostate cancer , PSA per urologist- sees Dr. Jacqlyn Larsen and had a recent exam  Men age 64 to 47 years if ever smoked recommended to get a one time AAA ultrasound screening exam. N/A  Low Dose CT Chest recommended if Age 58-80 years, 19 pack-year currently smoking OR have quit w/in 15years.   Lifestyle risk factor issued reviewed: Diet, exercise - difficult because uses wheelchair - but advised to use weights on upper body,  weight management,  nutrition/diet.  Preventative health measures discussed (5-10 year plan).  Reviewed and recommended vaccinations: - Pneumovax  - Prevnar  - Annual Influenza - Zostavax - not covered by insurance - Tdap   Depression screening: Done and normal today Fall risk screening: Done - finished PT recently, but advised to continue home PT Discuss ADLs/IADLs: lives in a group home  Current medical providers: See HPI  Other health risk factors identified this visit: No other issues Cognitive impairment issues: caregiver states memory seems good, no concerns  All above discussed with patient. Appropriate education, counseling and referral will be made based upon the above.    Patient Active Problem List   Diagnosis Date Noted  . Chronic constipation 11/19/2015  . Mild major depression ([place]) 11/19/2015  . Complicated grieving 32/44/0102  . Recurrent UTI 09/11/2015  . Adenocarcinoma of prostate ([place]) 03/02/2015  . Amyotrophia 03/02/2015  . Edema leg 03/02/2015  . Cerebral palsy ([place]) 03/02/2015  . Dyslipidemia 03/02/2015  . Dermatitis, eczematoid 03/02/2015  . 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 H/O acute poliomyelitis 03/02/2015  . Acquired lymphedema 03/02/2015  . Mental retardation 03/02/2015  . Hypertrophy of nail 03/02/2015  . Adiposity 03/02/2015  . Absence of bladder continence 03/02/2015  . Chronic venous insufficiency 03/02/2015  . Avitaminosis D 03/02/2015  . Adynamia 03/02/2015  . Dependent on wheelchair 03/02/2015  . Kidney cysts 03/02/2015  . At risk for falling 03/02/2015  . Cerebral vascular disease 03/02/2015  . Strabismus 02/11/2015  . Urge incontinence of urine 02/11/2015  . Wheelchair dependence 02/11/2015  . Obesity 02/11/2015  . Prostate cancer ([place]) 10/30/2014  . Urinary frequency 10/30/2014    Past Surgical History:  Procedure Laterality Date  . leg circulation surgery Right   . PROSTATE SURGERY  11/10/2008   BRACHYTHERAPY    Family History  Problem Relation Age of Onset  . Heart attack Brother   . Heart disease      Social History   Social History  . Marital status: Single    Spouse name: N/A  . Number of children: N/A  . Years of education: N/A   Occupational History  . Not on file.   Social History Main Topics  . Smoking status: Never Smoker  . Smokeless tobacco: Never Used  . Alcohol use No  . Drug use: No  .  Sexual activity: No   Other Topics Concern  . Not on file   Social History Narrative   Lives at [facility] group home, 5 males.          Current Outpatient Prescriptions:  .  alfuzosin (UROXATRAL) 10 MG 24 hr tablet, Take 1 tablet (10 mg total) by mouth daily with breakfast., Disp: 90 tablet, Rfl: 3 .  ALPRAZolam  (XANAX) 0.25 MG tablet, Take 1 tablet (0.25 mg total) by mouth 2 (two) times daily as needed for anxiety., Disp: 20 tablet, Rfl: 0 .  aspirin 81 MG tablet, Take 1 tablet (81 mg total) by mouth daily., Disp: 30 tablet, Rfl: 12 .  augmented betamethasone dipropionate (DIPROLENE AF) 0.05 % cream, Reported on 09/08/2015, Disp: , Rfl:  .  betamethasone dipropionate (DIPROLENE) 0.05 % cream, Apply topically 2 (two) times daily., Disp: 90 g, Rfl: 1 .  Calcium Carb-Cholecalciferol (CALCIUM-VITAMIN D3) 500-400 MG-UNIT TABS, Take 500 Units by mouth 2 (two) times daily. , Disp: , Rfl:  .  cloNIDine (CATAPRES) 0.1 MG tablet, Take 1 tablet (0.1 mg total) by mouth 3 (three) times daily., Disp: 30 tablet, Rfl: 0 .  Cranberry 425 MG CAPS, Take 1 capsule by mouth daily., Disp: , Rfl:  .  Elastic Bandages & Supports (MEDICAL COMPRESSION STOCKINGS) MISC, 1 Units by Does not apply route daily., Disp: 2 each, Rfl: 0 .  ibuprofen (ADVIL,MOTRIN) 800 MG tablet, Take 1 tablet (800 mg total) by mouth every 8 (eight) hours as needed., Disp: 30 tablet, Rfl: 0 .  polyethylene glycol powder (GLYCOLAX/MIRALAX) powder, Take 17 g by mouth daily., Disp: 3350 g, Rfl: 5 .  Polyvinyl Alcohol-Povidone 5-6 MG/ML SOLN, Apply 2 drops to eye 2 (two) times daily as needed., Disp: 1 Bottle, Rfl: 2 .  sertraline (ZOLOFT) 50 MG tablet, Take 1 tablet (50 mg total) by mouth daily., Disp: 30 tablet, Rfl: 2 .  vitamin C (ASCORBIC ACID) 500 MG tablet, Take 1 tablet (500 mg total) by mouth 2 (two) times daily., Disp: 60 tablet, Rfl: 12  No Known Allergies   ROS  Constitutional: Negative for fever or weight change.  Respiratory: Negative for cough and shortness of breath.   Cardiovascular: Negative for chest pain or palpitations.  Gastrointestinal: Negative for abdominal pain, no bowel changes.  Musculoskeletal: Negative for gait problem or joint swelling.  Skin: Negative for rash.  Neurological: Negative for dizziness or headache.  No other  specific complaints in a complete review of systems (except as listed in HPI above).  Objective  Vitals:   02/14/16 1105  BP: 128/78  Pulse: 65  Resp: 16  Temp: 98.1 F (36.7 C)  TempSrc: Oral  SpO2: 98%  Weight: 169 lb 2 oz (76.7 kg)  Height: 5' 9"  (1.753 m)    Body mass index is 24.98 kg/m.  Physical Exam  Constitutional: Patient appears well-developed . Obese No distress.  HEENT: head atraumatic, normocephalic, pupils equal and reactive to light,  neck supple, throat within normal limits, strabismus Cardiovascular: Normal rate, regular rhythm and normal heart sounds.  No murmur heard. Wearing compression stocking hoses Pulmonary/Chest: Effort normal and breath sounds normal. No respiratory distress. Abdominal: Soft.  There is no tenderness. Psychiatric: Patient has a normal mood and affect. Cooperative, speaks, but difficult to understand Muscular Skeletal: lower extremity atrophy and also on both hands, no back pain, wheelchair bound  Recent Results (from the past 2160 hour(s))  CULTURE, URINE COMPREHENSIVE     Status: None   Collection Time:  12/23/15 12:23 PM  Result Value Ref Range   Culture      METHICILLIN RESISTANT STAPHYLOCOCCUS AUREUS ENTEROCOCCUS SPECIES     Comment: Rifampin and Gentamicin should not be used as single drugs for treatment of Staph infections.    Colony Count Greater than 100,000 CFU/mL    Organism ID, Bacteria METHICILLIN RESISTANT STAPHYLOCOCCUS AUREUS    Colony Count 50,000-100,000 CFU/mL    Organism ID, Bacteria ENTEROCOCCUS SPECIES       Susceptibility   Enterococcus species -  (no method available)    AMPICILLIN <=2 Sensitive     LEVOFLOXACIN 1 Sensitive     NITROFURANTOIN <=16 Sensitive     VANCOMYCIN 1 Sensitive     TETRACYCLINE <=1 Sensitive    Methicillin resistant staphylococcus aureus -  (no method available)    OXACILLIN >=4 Resistant     CEFAZOLIN  Resistant     GENTAMICIN <=0.5 Sensitive     CIPROFLOXACIN <=0.5  Sensitive     LEVOFLOXACIN 0.25 Sensitive     NITROFURANTOIN <=16 Sensitive     TRIMETH/SULFA <=10 Sensitive     VANCOMYCIN <=0.5 Sensitive     LINEZOLID 2 Sensitive     RIFAMPIN <=0.5 Sensitive     TETRACYCLINE <=1 Sensitive   Urinalysis complete, with microscopic- may I&O cath if menses     Status: Abnormal   Collection Time: 12/23/15  5:27 PM  Result Value Ref Range   Color, Urine YELLOW (A) YELLOW   APPearance HAZY (A) CLEAR   Glucose, UA NEGATIVE NEGATIVE mg/dL   Bilirubin Urine NEGATIVE NEGATIVE   Ketones, ur NEGATIVE NEGATIVE mg/dL   Specific Gravity, Urine 1.013 1.005 - 1.030   Hgb urine dipstick NEGATIVE NEGATIVE   pH 7.0 5.0 - 8.0   Protein, ur NEGATIVE NEGATIVE mg/dL   Nitrite NEGATIVE NEGATIVE   Leukocytes, UA NEGATIVE NEGATIVE   RBC / HPF 0-5 0 - 5 RBC/hpf   WBC, UA 0-5 0 - 5 WBC/hpf   Bacteria, UA NONE SEEN NONE SEEN   Squamous Epithelial / LPF 0-5 (A) NONE SEEN   Mucous PRESENT    Amorphous Crystal PRESENT   Basic metabolic panel     Status: Abnormal   Collection Time: 12/23/15  5:27 PM  Result Value Ref Range   Sodium 138 135 - 145 mmol/L   Potassium 3.8 3.5 - 5.1 mmol/L   Chloride 105 101 - 111 mmol/L   CO2 26 22 - 32 mmol/L   Glucose, Bld 100 (H) 65 - 99 mg/dL   BUN 14 6 - 20 mg/dL   Creatinine, Ser 0.68 0.61 - 1.24 mg/dL   Calcium 9.0 8.9 - 10.3 mg/dL   GFR calc non Af Amer >60 >60 mL/min   GFR calc Af Amer >60 >60 mL/min    Comment: (NOTE) The eGFR has been calculated using the CKD EPI equation. This calculation has not been validated in all clinical situations. eGFR's persistently <60 mL/min signify possible Chronic Kidney Disease.    Anion gap 7 5 - 15  CBC     Status: Abnormal   Collection Time: 12/23/15  5:27 PM  Result Value Ref Range   WBC 5.5 3.8 - 10.6 K/uL   RBC 4.48 4.40 - 5.90 MIL/uL   Hemoglobin 14.7 13.0 - 18.0 g/dL   HCT 41.9 40.0 - 52.0 %   MCV 93.6 80.0 - 100.0 fL   MCH 32.9 26.0 - 34.0 pg   MCHC 35.1 32.0 - 36.0 g/dL   RDW  13.1 11.5 - 14.5 %   Platelets 149 (L) 150 - 440 K/uL     PHQ2/9: Depression screen [facility] 2/9 02/14/2016 12/23/2015 11/19/2015 08/19/2015 07/19/2015  Decreased Interest 0 0 0 0 0  Down, Depressed, Hopeless 0 0 0 1 0  PHQ - 2 Score 0 0 0 1 0     Fall Risk: Fall Risk  02/14/2016 12/23/2015 11/19/2015 08/19/2015 07/19/2015  Falls in the past year? Yes Yes Yes No No  Number falls in past yr: 2 or more 2 or more 1 - -  Injury with Fall? Yes Yes No - -  Risk Factor Category  High Fall Risk High Fall Risk - - -  Risk for fall due to : - History of fall(s);Impaired balance/gait;Impaired mobility - - -        Assessment & Plan  1. Medicare annual wellness visit, subsequent  Discussed importance of 150 minutes of physical activity weekly, eat two servings of fish weekly, eat one serving of tree nuts ( cashews, pistachios, pecans, almonds.Marland Kitchen) every other day, eat 6 servings of fruit/vegetables daily and drink plenty of water and avoid sweet beverages.  FL2 form was filled out  2. Need for influenza vaccination  - Flu vaccine HIGH DOSE PF (Fluzone High dose)

## 2016-02-17 ENCOUNTER — Other Ambulatory Visit: Payer: Self-pay

## 2016-02-17 DIAGNOSIS — F4321 Adjustment disorder with depressed mood: Secondary | ICD-10-CM

## 2016-02-17 DIAGNOSIS — Z634 Disappearance and death of family member: Principal | ICD-10-CM

## 2016-02-17 DIAGNOSIS — F320 Major depressive disorder, single episode, mild: Secondary | ICD-10-CM

## 2016-02-17 DIAGNOSIS — F4329 Adjustment disorder with other symptoms: Secondary | ICD-10-CM

## 2016-02-17 NOTE — Telephone Encounter (Signed)
Patient requesting refill of Sertraline.

## 2016-02-18 ENCOUNTER — Telehealth: Payer: Self-pay | Admitting: Family Medicine

## 2016-02-18 NOTE — Telephone Encounter (Signed)
JESSICA AT [facility] WANTS TO KNOW IF THEY CAN GET AN ORDER TO HAVE THE PATIENTS TAKE MIRALAX TO JUST PRN INSTEAD OF EVERY DAY FOR HIS STOOLS ARE VERY LOOSE.

## 2016-02-19 MED ORDER — SERTRALINE HCL 50 MG PO TABS
50.0000 mg | ORAL_TABLET | Freq: Every day | ORAL | 2 refills | Status: DC
Start: 2016-02-19 — End: 2016-05-17

## 2016-02-20 ENCOUNTER — Other Ambulatory Visit: Payer: Self-pay | Admitting: Family Medicine

## 2016-02-20 DIAGNOSIS — K5909 Other constipation: Secondary | ICD-10-CM

## 2016-02-20 MED ORDER — POLYETHYLENE GLYCOL 3350 17 GM/SCOOP PO POWD: 17.0000 g | Freq: Every day | ORAL | 5 refills | Status: DC | PRN

## 2016-02-20 NOTE — Telephone Encounter (Signed)
changed

## 2016-02-23 ENCOUNTER — Ambulatory Visit: Payer: Medicare Other | Admitting: Family Medicine

## 2016-03-07 DIAGNOSIS — H25813 Combined forms of age-related cataract, bilateral: Secondary | ICD-10-CM | POA: Diagnosis not present

## 2016-04-04 DIAGNOSIS — R350 Frequency of micturition: Secondary | ICD-10-CM | POA: Diagnosis not present

## 2016-04-04 DIAGNOSIS — J00 Acute nasopharyngitis [common cold]: Secondary | ICD-10-CM | POA: Diagnosis not present

## 2016-04-25 ENCOUNTER — Other Ambulatory Visit: Payer: Medicare Other

## 2016-05-01 ENCOUNTER — Encounter (INDEPENDENT_AMBULATORY_CARE_PROVIDER_SITE_OTHER): Payer: Self-pay | Admitting: Vascular Surgery

## 2016-05-01 ENCOUNTER — Ambulatory Visit (INDEPENDENT_AMBULATORY_CARE_PROVIDER_SITE_OTHER): Payer: Medicare Other | Admitting: Vascular Surgery

## 2016-05-01 VITALS — BP 132/77 | HR 66 | Resp 16 | Wt 170.0 lb

## 2016-05-01 DIAGNOSIS — I872 Venous insufficiency (chronic) (peripheral): Secondary | ICD-10-CM

## 2016-05-01 DIAGNOSIS — E785 Hyperlipidemia, unspecified: Secondary | ICD-10-CM

## 2016-05-01 DIAGNOSIS — Z993 Dependence on wheelchair: Secondary | ICD-10-CM | POA: Diagnosis not present

## 2016-05-01 DIAGNOSIS — G809 Cerebral palsy, unspecified: Secondary | ICD-10-CM

## 2016-05-01 DIAGNOSIS — R600 Localized edema: Secondary | ICD-10-CM | POA: Diagnosis not present

## 2016-05-01 NOTE — Progress Notes (Signed)
MRN : [id number]  [name] is a 76 y.o. (1940/12/19) male who presents with chief complaint of  Chief Complaint  Patient presents with  . Follow-up  .  History of Present Illness: The patient returns to the office for followup evaluation regarding leg swelling.  The swelling has improved quite a bit and the pain associated with swelling has decreased substantially. There have not been any interval development of a ulcerations or wounds.  Since the previous visit the patient has been wearing graduated compression stockings and has noted significant improvement in the lymphedema. The patient has been using compression routinely morning until night.  The patient also states elevation during the day and exercise is being done too.     Current Meds  Medication Sig  . alfuzosin (UROXATRAL) 10 MG 24 hr tablet Take 1 tablet (10 mg total) by mouth daily with breakfast.  . ALPRAZolam (XANAX) 0.25 MG tablet Take 1 tablet (0.25 mg total) by mouth 2 (two) times daily as needed for anxiety.  Marland Kitchen aspirin 81 MG tablet Take 1 tablet (81 mg total) by mouth daily.  Marland Kitchen augmented betamethasone dipropionate (DIPROLENE AF) 0.05 % cream Reported on 09/08/2015  . betamethasone dipropionate (DIPROLENE) 0.05 % cream Apply topically 2 (two) times daily. (Patient taking differently: Apply topically as needed. )  . Calcium Carb-Cholecalciferol (CALCIUM-VITAMIN D3) 500-400 MG-UNIT TABS Take 500 Units by mouth 2 (two) times daily.   . cloNIDine (CATAPRES) 0.1 MG tablet Take 1 tablet (0.1 mg total) by mouth 3 (three) times daily. (Patient taking differently: Take 0.1 mg by mouth as needed. )  . Cranberry 425 MG CAPS Take 1 capsule by mouth daily.  Regino Schultze Bandages & Supports (MEDICAL COMPRESSION STOCKINGS) MISC 1 Units by Does not apply route daily.  Marland Kitchen ibuprofen (ADVIL,MOTRIN) 800 MG tablet Take 1 tablet (800 mg total) by mouth every 8 (eight) hours as needed.  . polyethylene glycol powder (GLYCOLAX/MIRALAX)  powder Take 17 g by mouth daily as needed.  . Polyvinyl Alcohol-Povidone 5-6 MG/ML SOLN Apply 2 drops to eye 2 (two) times daily as needed.  . sertraline (ZOLOFT) 50 MG tablet Take 1 tablet (50 mg total) by mouth daily.  . vitamin C (ASCORBIC ACID) 500 MG tablet Take 1 tablet (500 mg total) by mouth 2 (two) times daily.    Past Medical History:  Diagnosis Date  . Atrophy, disuse, muscle   . BPH (benign prostatic hyperplasia)   . Cerebral palsy ([place])   . Eczema   . Elevated PSA   . Exotropia   . Hematuria, gross   . Hyperlipidemia   . Incontinence   . Lower urinary tract symptoms   . Lymphedema   . Mental retardation   . Over weight   . Prostate cancer ([place])   . Renal cyst   . Urinary frequency   . Urinary urgency   . Venous insufficiency   . Vitamin D deficiency   . Wheelchair dependence     Past Surgical History:  Procedure Laterality Date  . leg circulation surgery Right   . PROSTATE SURGERY  11/10/2008   BRACHYTHERAPY    Social History Social History  Substance Use Topics  . Smoking status: Never Smoker  . Smokeless tobacco: Never Used  . Alcohol use No    Family History Family History  Problem Relation Age of Onset  . Heart attack Brother   . Heart disease    No family history of bleeding/clotting disorders, porphyria or autoimmune disease  No Known Allergies   REVIEW OF SYSTEMS (Negative unless checked)  Constitutional: [] Weight loss  [] Fever  [] Chills Cardiac: [] Chest pain   [] Chest pressure   [] Palpitations   [] Shortness of breath when laying flat   [] Shortness of breath with exertion. Vascular:  [] Pain in legs with walking   [] Pain in legs at rest  [] History of DVT   [] Phlebitis   [x] Swelling in legs   [] Varicose veins   [] Non-healing ulcers Pulmonary:   [] Uses home oxygen   [] Productive cough   [] Hemoptysis   [] Wheeze  [] COPD   [] Asthma Neurologic:  [] Dizziness   [] Seizures   [] History of stroke   [] History of TIA  [] Aphasia   [] Vissual changes    [] Weakness or numbness in arm   [] Weakness or numbness in leg Musculoskeletal:   [] Joint swelling   [x] Joint pain   [] Low back pain Hematologic:  [] Easy bruising  [] Easy bleeding   [] Hypercoagulable state   [] Anemic Gastrointestinal:  [] Diarrhea   [] Vomiting  [] Gastroesophageal reflux/heartburn   [] Difficulty swallowing. Genitourinary:  [] Chronic kidney disease   [] Difficult urination  [] Frequent urination   [] Blood in urine Skin:  [] Rashes   [] Ulcers  Psychological:  [] History of anxiety   []  History of major depression.  Physical Examination  Vitals:   05/01/16 1043  BP: 132/77  Pulse: 66  Resp: 16  Weight: 170 lb (77.1 kg)   Body mass index is 25.1 kg/m. Gen: WD/WN, NAD Head: [place]/AT, No temporalis wasting.  Ear/Nose/Throat: Hearing grossly intact, nares w/o erythema or drainage, poor dentition Eyes: PER, EOMI, sclera nonicteric.  Neck: Supple, no masses.  No bruit or JVD.  Pulmonary:  Good air movement, clear to auscultation bilaterally, no use of accessory muscles.  Cardiac: RRR, normal S1, S2, no Murmurs. Vascular: 2+ edema bilateral lower extremities, mild venous stasis dermatitis skin changes no open wounds or sores Vessel Right Left  Radial Palpable Palpable  Ulnar Palpable Palpable  Brachial Palpable Palpable  Carotid Palpable Palpable  Femoral Palpable Palpable  Popliteal Palpable Palpable  PT Palpable Palpable  DP Palpable Palpable   Gastrointestinal: soft, non-distended. No guarding/no peritoneal signs.  Musculoskeletal: M/S 5/5 throughout.  No deformity or atrophy.  Neurologic: CN 2-12 intact. Pain and light touch intact in extremities.  Symmetrical.  Speech is fluent. Motor exam as listed above. Psychiatric: Judgment intact, Mood & affect appropriate for pt's clinical situation. Dermatologic: No rashes or ulcers noted.  No changes consistent with cellulitis. Lymph : No Cervical lymphadenopathy, no lichenification or skin changes of chronic  lymphedema.  CBC Lab Results  Component Value Date   WBC 5.5 12/23/2015   HGB 14.7 12/23/2015   HCT 41.9 12/23/2015   MCV 93.6 12/23/2015   PLT 149 (L) 12/23/2015    BMET    Component Value Date/Time   NA 138 12/23/2015 1727   NA 139 02/11/2015 1404   NA 139 08/14/2013 1618   K 3.8 12/23/2015 1727   K 3.8 08/14/2013 1618   CL 105 12/23/2015 1727   CL 108 (H) 08/14/2013 1618   CO2 26 12/23/2015 1727   CO2 28 08/14/2013 1618   GLUCOSE 100 (H) 12/23/2015 1727   GLUCOSE 99 08/14/2013 1618   BUN 14 12/23/2015 1727   BUN 11 02/11/2015 1404   BUN 14 08/14/2013 1618   CREATININE 0.68 12/23/2015 1727   CREATININE 0.71 08/14/2013 1618   CALCIUM 9.0 12/23/2015 1727   CALCIUM 8.4 (L) 08/14/2013 1618   GFRNONAA >60 12/23/2015 1727   GFRNONAA >60 08/14/2013  [place] 12/23/2015 1727   GFRAA >60 08/14/2013 1618   CrCl cannot be calculated (Patient's most recent lab result is older than the maximum 21 days allowed.).  COAG No results found for: INR, PROTIME  Radiology No results found.   Assessment/Plan 1. Chronic venous insufficiency No surgery or intervention at this point in time.    I have reviewed my discussion with the patient regarding venous insufficiency and secondary lymph edema and why it  causes symptoms. I have discussed with the patient the chronic skin changes that accompany these problems and the long term sequela such as ulceration and infection.  Patient will continue wearing graduated compression stockings on a daily basis a prescription was given to the patient to keep this updated. The patient will  put the stockings on first thing in the morning and removing them in the evening. The patient is instructed specifically not to sleep in the stockings.  In addition, behavioral modification including elevation during the day will be continued.  Diet and salt restriction was also discussed.  Previous duplex ultrasound of the lower extremities shows normal  deep venous system, superficial reflux was not present.   Patient will follow up on a when necessary basis  2. Edema leg See #1  3. Wheelchair dependence Continue current management  4. Dyslipidemia Continue statin as ordered and reviewed, no changes at this time   5. Cerebral palsy, unspecified type ([place]) No changes    Hortencia Pilar, MD  05/01/2016 10:56 AM

## 2016-05-02 ENCOUNTER — Ambulatory Visit: Payer: Medicare Other | Admitting: Urology

## 2016-05-03 DIAGNOSIS — R339 Retention of urine, unspecified: Secondary | ICD-10-CM | POA: Diagnosis not present

## 2016-05-03 DIAGNOSIS — R310 Gross hematuria: Secondary | ICD-10-CM | POA: Diagnosis not present

## 2016-05-03 DIAGNOSIS — N200 Calculus of kidney: Secondary | ICD-10-CM | POA: Diagnosis not present

## 2016-05-03 DIAGNOSIS — Z8546 Personal history of malignant neoplasm of prostate: Secondary | ICD-10-CM | POA: Diagnosis not present

## 2016-05-03 DIAGNOSIS — N3020 Other chronic cystitis without hematuria: Secondary | ICD-10-CM | POA: Diagnosis not present

## 2016-05-05 ENCOUNTER — Other Ambulatory Visit: Payer: Self-pay | Admitting: Family Medicine

## 2016-05-05 NOTE — Telephone Encounter (Signed)
Patient requesting refill of Calcium-Vitamin D to [facility].  

## 2016-05-17 ENCOUNTER — Telehealth: Payer: Self-pay | Admitting: Family Medicine

## 2016-05-17 ENCOUNTER — Encounter: Payer: Self-pay | Admitting: Family Medicine

## 2016-05-17 ENCOUNTER — Ambulatory Visit (INDEPENDENT_AMBULATORY_CARE_PROVIDER_SITE_OTHER): Payer: Medicare Other | Admitting: Family Medicine

## 2016-05-17 VITALS — BP 126/66 | HR 61 | Temp 98.3°F | Resp 18 | Ht 69.0 in | Wt 163.3 lb

## 2016-05-17 DIAGNOSIS — F320 Major depressive disorder, single episode, mild: Secondary | ICD-10-CM | POA: Diagnosis not present

## 2016-05-17 DIAGNOSIS — R2681 Unsteadiness on feet: Secondary | ICD-10-CM

## 2016-05-17 DIAGNOSIS — Z993 Dependence on wheelchair: Secondary | ICD-10-CM

## 2016-05-17 DIAGNOSIS — F4381 Prolonged grief disorder: Secondary | ICD-10-CM

## 2016-05-17 DIAGNOSIS — H509 Unspecified strabismus: Secondary | ICD-10-CM | POA: Diagnosis not present

## 2016-05-17 DIAGNOSIS — F79 Unspecified intellectual disabilities: Secondary | ICD-10-CM | POA: Diagnosis not present

## 2016-05-17 DIAGNOSIS — B91 Sequelae of poliomyelitis: Secondary | ICD-10-CM | POA: Diagnosis not present

## 2016-05-17 DIAGNOSIS — F4329 Adjustment disorder with other symptoms: Secondary | ICD-10-CM

## 2016-05-17 DIAGNOSIS — L308 Other specified dermatitis: Secondary | ICD-10-CM

## 2016-05-17 DIAGNOSIS — Z634 Disappearance and death of family member: Secondary | ICD-10-CM

## 2016-05-17 DIAGNOSIS — R600 Localized edema: Secondary | ICD-10-CM

## 2016-05-17 DIAGNOSIS — K5909 Other constipation: Secondary | ICD-10-CM | POA: Diagnosis not present

## 2016-05-17 DIAGNOSIS — N390 Urinary tract infection, site not specified: Secondary | ICD-10-CM

## 2016-05-17 DIAGNOSIS — F4321 Adjustment disorder with depressed mood: Secondary | ICD-10-CM

## 2016-05-17 DIAGNOSIS — R634 Abnormal weight loss: Secondary | ICD-10-CM

## 2016-05-17 MED ORDER — SERTRALINE HCL 50 MG PO TABS
50.0000 mg | ORAL_TABLET | Freq: Every day | ORAL | 5 refills | Status: DC
Start: 2016-05-17 — End: 2016-11-16

## 2016-05-17 MED ORDER — POLYETHYLENE GLYCOL 3350 17 GM/SCOOP PO POWD: 17.0000 g | Freq: Every day | ORAL | 5 refills | Status: DC | PRN

## 2016-05-17 MED ORDER — FLUTICASONE PROPIONATE 50 MCG/ACT NA SUSP: NASAL | 5 refills | Status: DC

## 2016-05-17 NOTE — Telephone Encounter (Signed)
[name] from [name] states patient was seen today and it was forgotten to ask for PT (physical therapy). The order can be faxed to [phone]. If you have any questions she can be reached at [phone] ext [phone]

## 2016-05-17 NOTE — Progress Notes (Signed)
Name: [name]   MRN: [id number]    DOB: 1940-05-13   Date:05/17/2016       Progress Note  Subjective  Chief Complaint  Chief Complaint  Patient presents with  . Medication Refill    3 month F/U  . Depression    Doing well with medication since losing his sister.  . Constipation    Doing well having a bowel movement also every morning per caregiver Peggy.   . Nasal Congestion    Patient went to Urgent Care earlier this month and was prescribed Flonase, does he need to stay on it or D/C off list?    HPI  Depression Mild /Grieving: he is doing well since started on Zoloft Summer 2017. He has great family support, but was grieving the loss of his older sister, but he has another sister - [name] - that calls him daily, however she seems to annoy him. His legal guardian is his nephew Athel Elbers ), and he is trying to limit their interection. No longer withdrawn from the group,  sleeping well, he lost another 6 lbs since last visit, but caregiver states he has been eating. We will check labs on his next visit, advised to monitor his intake for now.   Chronic constipation: caregiver Best boy  ) came in with him, she states he has a long history of constipation and he drinks prune juice and also has fiber bars and is on Miralax since the Summer, stools are soft and daily. He is using miralax every three days to control constipation  Recurrent UTI: with positive E.coli culture multiple times. He is a colonizer, urine odor resolved and no longer has hematuria. 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 He is off Macrobid and is on Uroxatral, he denies any abdominal pain, no urine odor.    Patient Active Problem List   Diagnosis Date Noted  . Late effect acute polio 05/17/2016  . Chronic constipation 11/19/2015  . Mild major depression ([place]) 11/19/2015  . Complicated grieving 0000000  . Recurrent UTI 09/11/2015  . Adenocarcinoma of prostate ([place]) 03/02/2015  . Amyotrophia  03/02/2015  . Edema leg 03/02/2015  . Cerebral palsy ([place]) 03/02/2015  . Dyslipidemia 03/02/2015  . Dermatitis, eczematoid 03/02/2015  . 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 H/O acute poliomyelitis 03/02/2015  . Acquired lymphedema 03/02/2015  . Mental retardation 03/02/2015  . Hypertrophy of nail 03/02/2015  . Adiposity 03/02/2015  . Absence of bladder continence 03/02/2015  . Chronic venous insufficiency 03/02/2015  . Avitaminosis D 03/02/2015  . Adynamia 03/02/2015  . Dependent on wheelchair 03/02/2015  . Kidney cysts 03/02/2015  . At risk for falling 03/02/2015  . Cerebral vascular disease 03/02/2015  . Strabismus 02/11/2015  . Urge incontinence of urine 02/11/2015  . Wheelchair dependence 02/11/2015  . Obesity 02/11/2015  . Urinary frequency 10/30/2014    Past Surgical History:  Procedure Laterality Date  . leg circulation surgery Right   . PROSTATE SURGERY  11/10/2008   BRACHYTHERAPY    Family History  Problem Relation Age of Onset  . Heart attack Brother   . Heart disease      Social History   Social History  . Marital status: Single    Spouse name: N/A  . Number of children: N/A  . Years of education: N/A   Occupational History  . Not on file.   Social History Main Topics  . Smoking status: Never Smoker  . Smokeless tobacco: Never Used  . Alcohol  use No  . Drug use: No  . Sexual activity: No   Other Topics Concern  . Not on file   Social History Narrative   Lives at [facility] group home, 5 males.          Current Outpatient Prescriptions:  .  alfuzosin (UROXATRAL) 10 MG 24 hr tablet, Take 1 tablet (10 mg total) by mouth daily with breakfast., Disp: 90 tablet, Rfl: 3 .  ALPRAZolam (XANAX) 0.25 MG tablet, Take 1 tablet (0.25 mg total) by mouth 2 (two) times daily as needed for anxiety., Disp: 20 tablet, Rfl: 0 .  aspirin 81 MG tablet, Take 1 tablet (81 mg total) by mouth daily., Disp: 30 tablet, Rfl: 12 .  betamethasone dipropionate (DIPROLENE)  0.05 % cream, Apply topically 2 (two) times daily. (Patient taking differently: Apply topically as needed. ), Disp: 90 g, Rfl: 1 .  Calcium Carb-Cholecalciferol 500-400 MG-UNIT TABS, TAKE ONE TABLET BY MOUTH 2 TIMES A DAY, Disp: 60 tablet, Rfl: 12 .  cloNIDine (CATAPRES) 0.1 MG tablet, Take 1 tablet (0.1 mg total) by mouth 3 (three) times daily. (Patient taking differently: Take 0.1 mg by mouth as needed. ), Disp: 30 tablet, Rfl: 0 .  Cranberry 425 MG CAPS, Take 1 capsule by mouth daily., Disp: , Rfl:  .  Elastic Bandages & Supports (MEDICAL COMPRESSION STOCKINGS) MISC, 1 Units by Does not apply route daily., Disp: 2 each, Rfl: 0 .  ibuprofen (ADVIL,MOTRIN) 800 MG tablet, Take 1 tablet (800 mg total) by mouth every 8 (eight) hours as needed., Disp: 30 tablet, Rfl: 0 .  polyethylene glycol powder (GLYCOLAX/MIRALAX) powder, Take 17 g by mouth daily as needed., Disp: 3350 g, Rfl: 5 .  Polyvinyl Alcohol-Povidone 5-6 MG/ML SOLN, Apply 2 drops to eye 2 (two) times daily as needed., Disp: 1 Bottle, Rfl: 2 .  sertraline (ZOLOFT) 50 MG tablet, Take 1 tablet (50 mg total) by mouth daily., Disp: 30 tablet, Rfl: 5 .  vitamin C (ASCORBIC ACID) 500 MG tablet, Take 1 tablet (500 mg total) by mouth 2 (two) times daily., Disp: 60 tablet, Rfl: 12  No Known Allergies   ROS  Constitutional: Negative for fever, positive for mild weight change.  Respiratory: Negative for cough and shortness of breath.   Cardiovascular: Negative for chest pain or palpitations.  Gastrointestinal: Negative for abdominal pain, no bowel changes.  Musculoskeletal:Positive for gait problem ( wheel chair dependent )or joint swelling.  Skin: Positive for intermittent rash on lower legs  Neurological: Negative for dizziness or headache.  No other specific complaints in a complete review of systems (except as listed in HPI above).   Objective  Vitals:   05/17/16 0941  BP: 126/66  Pulse: 61  Resp: 18  Temp: 98.3 F (36.8 C)   TempSrc: Oral  SpO2: 97%  Weight: 163 lb 4.8 oz (74.1 kg)  Height: 5\' 9"  (1.753 m)    Body mass index is 24.12 kg/m.  Physical Exam  Constitutional: Patient appears well-developed and well-nourished. Obese No distress.  HEENT: head atraumatic, normocephalic,  strabismus,  neck supple, throat within normal limits Cardiovascular: Normal rate, regular rhythm and normal heart sounds.  No murmur heard. 1 plus bilateral ankle  edema. Pulmonary/Chest: Effort normal and breath sounds normal. No respiratory distress. Abdominal: Soft.  There is no tenderness. Psychiatric: Patient has a normal mood and affect. behavior is normal. Judgment and thought content normal. Muscular Skeletal: wheelchair bound, atrophic legs ( he had to be assisted to check  his  weight )  PHQ2/9: Depression screen [facility] 2/9 02/14/2016 12/23/2015 11/19/2015 08/19/2015 07/19/2015  Decreased Interest 0 0 0 0 0  Down, Depressed, Hopeless 0 0 0 1 0  PHQ - 2 Score 0 0 0 1 0     Fall Risk: Fall Risk  02/14/2016 12/23/2015 11/19/2015 08/19/2015 07/19/2015  Falls in the past year? Yes Yes Yes No No  Number falls in past yr: 2 or more 2 or more 1 - -  Injury with Fall? Yes Yes No - -  Risk Factor Category  High Fall Risk High Fall Risk - - -  Risk for fall due to : - History of fall(s);Impaired balance/gait;Impaired mobility - - -     Assessment & Plan  1. Recurrent UTI  Diagnosed with chronic cystitis, doing better now  2. Chronic constipation  - polyethylene glycol powder (GLYCOLAX/MIRALAX) powder; Take 17 g by mouth daily as needed.  Dispense: 3350 g; Refill: 5  3. Gait instability  Continue wheelchair  4. Bilateral edema of lower extremity  Improved with compression stocking hoses  5. Dependent on wheelchair  From polio   6. Other eczema  stable  7. Mental retardation  stable  8. Strabismus  stable  9. Weight loss  We will monitor   10. Complicated grieving  - sertraline (ZOLOFT) 50 MG  tablet; Take 1 tablet (50 mg total) by mouth daily.  Dispense: 30 tablet; Refill: 5  11. Mild major depression ([facility])  - sertraline (ZOLOFT) 50 MG tablet; Take 1 tablet (50 mg total) by mouth daily.  Dispense: 30 tablet; Refill: 5

## 2016-06-05 ENCOUNTER — Telehealth: Payer: Self-pay | Admitting: Family Medicine

## 2016-06-05 NOTE — Telephone Encounter (Signed)
Was routed to [facility] 2 weeks ago not sure if order was placed or not

## 2016-06-05 NOTE — Telephone Encounter (Signed)
[name] from [name] is checking status on this PT order. She can be reached at [phone] ext [phone]

## 2016-06-06 NOTE — Telephone Encounter (Signed)
Spoke to [name] and no the order has not been received she stated that she needs a written order for PT for once a week

## 2016-06-06 NOTE — Telephone Encounter (Signed)
Please verify if verbal referral was already given

## 2016-06-07 ENCOUNTER — Telehealth: Payer: Self-pay | Admitting: Family Medicine

## 2016-06-07 NOTE — Telephone Encounter (Signed)
It was faxed to [facility] at [phone]. Confirmation was received.

## 2016-06-07 NOTE — Telephone Encounter (Signed)
Can you please fax to [facility].  Thank you

## 2016-06-12 DIAGNOSIS — R262 Difficulty in walking, not elsewhere classified: Secondary | ICD-10-CM | POA: Diagnosis not present

## 2016-06-12 DIAGNOSIS — M6281 Muscle weakness (generalized): Secondary | ICD-10-CM | POA: Diagnosis not present

## 2016-06-19 DIAGNOSIS — R262 Difficulty in walking, not elsewhere classified: Secondary | ICD-10-CM | POA: Diagnosis not present

## 2016-06-19 DIAGNOSIS — M6281 Muscle weakness (generalized): Secondary | ICD-10-CM | POA: Diagnosis not present

## 2016-07-03 DIAGNOSIS — R262 Difficulty in walking, not elsewhere classified: Secondary | ICD-10-CM | POA: Diagnosis not present

## 2016-07-03 DIAGNOSIS — M6281 Muscle weakness (generalized): Secondary | ICD-10-CM | POA: Diagnosis not present

## 2016-07-04 ENCOUNTER — Other Ambulatory Visit: Payer: Self-pay | Admitting: Family Medicine

## 2016-07-05 NOTE — Telephone Encounter (Signed)
Patient requesting refill of Aspirin to [facility].

## 2016-07-06 ENCOUNTER — Other Ambulatory Visit: Payer: Self-pay | Admitting: Family Medicine

## 2016-07-06 NOTE — Telephone Encounter (Signed)
Patient requesting refill of aspirin to [facility].

## 2016-07-10 DIAGNOSIS — R262 Difficulty in walking, not elsewhere classified: Secondary | ICD-10-CM | POA: Diagnosis not present

## 2016-07-10 DIAGNOSIS — M6281 Muscle weakness (generalized): Secondary | ICD-10-CM | POA: Diagnosis not present

## 2016-07-17 DIAGNOSIS — M6281 Muscle weakness (generalized): Secondary | ICD-10-CM | POA: Diagnosis not present

## 2016-07-17 DIAGNOSIS — R262 Difficulty in walking, not elsewhere classified: Secondary | ICD-10-CM | POA: Diagnosis not present

## 2016-07-18 ENCOUNTER — Telehealth: Payer: Self-pay | Admitting: Family Medicine

## 2016-07-18 NOTE — Telephone Encounter (Signed)
Pt is scheduled °

## 2016-07-18 NOTE — Telephone Encounter (Signed)
We will need documentation on what problems he is having before making a referral. Due to his insurance they will not be able to make a appointment by themselves. Can you schedule patient an appointment please. Thanks

## 2016-07-18 NOTE — Telephone Encounter (Signed)
[name] is trying to do a FU appt with [facility] and that office states pt needs a referral to go. Can we do this without seeing patient first [name] @ [phone]

## 2016-07-19 ENCOUNTER — Encounter: Payer: Self-pay | Admitting: Family Medicine

## 2016-07-19 ENCOUNTER — Ambulatory Visit (INDEPENDENT_AMBULATORY_CARE_PROVIDER_SITE_OTHER): Payer: Medicare Other | Admitting: Family Medicine

## 2016-07-19 VITALS — BP 118/70 | HR 67 | Temp 98.0°F | Resp 16 | Wt 163.0 lb

## 2016-07-19 DIAGNOSIS — R220 Localized swelling, mass and lump, head: Secondary | ICD-10-CM

## 2016-07-19 NOTE — Progress Notes (Signed)
Name: [name]   MRN: [id number]    DOB: Jan 08, 1941   Date:07/19/2016       Progress Note  Subjective  Chief Complaint  Chief Complaint  Patient presents with  . Referral    ENT bump on his tongue was seen for it previously but was never seen at ENT    HPI  Tongue nodule: patient was referred to ENT on 07/2015 and advised to follow up in one year. Group home caregiver brought him back so he can be referred. Lesion has not changed. It is not painful, color is the same.    Patient Active Problem List   Diagnosis Date Noted  . Late effect acute polio 05/17/2016  . Chronic constipation 11/19/2015  . Mild major depression ([place]) 11/19/2015  . Complicated grieving 44/04/270  . Recurrent UTI 09/11/2015  . Adenocarcinoma of prostate ([place]) 03/02/2015  . Amyotrophia 03/02/2015  . Edema leg 03/02/2015  . Cerebral palsy ([place]) 03/02/2015  . Dyslipidemia 03/02/2015  . Dermatitis, eczematoid 03/02/2015  . 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 H/O acute poliomyelitis 03/02/2015  . Acquired lymphedema 03/02/2015  . Mental retardation 03/02/2015  . Hypertrophy of nail 03/02/2015  . Adiposity 03/02/2015  . Absence of bladder continence 03/02/2015  . Chronic venous insufficiency 03/02/2015  . Avitaminosis D 03/02/2015  . Adynamia 03/02/2015  . Dependent on wheelchair 03/02/2015  . Kidney cysts 03/02/2015  . At risk for falling 03/02/2015  . Cerebral vascular disease 03/02/2015  . Strabismus 02/11/2015  . Urge incontinence of urine 02/11/2015  . Wheelchair dependence 02/11/2015  . Obesity 02/11/2015  . Urinary frequency 10/30/2014    Past Surgical History:  Procedure Laterality Date  . leg circulation surgery Right   . PROSTATE SURGERY  11/10/2008   BRACHYTHERAPY    Family History  Problem Relation Age of Onset  . Heart attack Brother   . Heart disease      Social History   Social History  . Marital status: Single    Spouse name: N/A  . Number of children: N/A  .  Years of education: N/A   Occupational History  . Not on file.   Social History Main Topics  . Smoking status: Never Smoker  . Smokeless tobacco: Never Used  . Alcohol use No  . Drug use: No  . Sexual activity: No   Other Topics Concern  . Not on file   Social History Narrative   Lives at [facility] group home, 5 males.          Current Outpatient Prescriptions:  .  alfuzosin (UROXATRAL) 10 MG 24 hr tablet, Take 1 tablet (10 mg total) by mouth daily with breakfast., Disp: 90 tablet, Rfl: 3 .  ALPRAZolam (XANAX) 0.25 MG tablet, Take 1 tablet (0.25 mg total) by mouth 2 (two) times daily as needed for anxiety., Disp: 20 tablet, Rfl: 0 .  aspirin 81 MG tablet, 1 tablet daily, Disp: 30 tablet, Rfl: 0 .  betamethasone dipropionate (DIPROLENE) 0.05 % cream, Apply topically 2 (two) times daily. (Patient taking differently: Apply topically as needed. ), Disp: 90 g, Rfl: 1 .  Calcium Carb-Cholecalciferol 500-400 MG-UNIT TABS, TAKE ONE TABLET BY MOUTH 2 TIMES A DAY, Disp: 60 tablet, Rfl: 12 .  cloNIDine (CATAPRES) 0.1 MG tablet, Take 1 tablet (0.1 mg total) by mouth 3 (three) times daily. (Patient taking differently: Take 0.1 mg by mouth as needed. ), Disp: 30 tablet, Rfl: 0 .  Cranberry 425 MG CAPS, Take 1 capsule by mouth  daily., Disp: , Rfl:  .  Elastic Bandages & Supports (MEDICAL COMPRESSION STOCKINGS) MISC, 1 Units by Does not apply route daily., Disp: 2 each, Rfl: 0 .  ibuprofen (ADVIL,MOTRIN) 800 MG tablet, Take 1 tablet (800 mg total) by mouth every 8 (eight) hours as needed., Disp: 30 tablet, Rfl: 0 .  polyethylene glycol powder (GLYCOLAX/MIRALAX) powder, Take 17 g by mouth daily as needed., Disp: 3350 g, Rfl: 5 .  Polyvinyl Alcohol-Povidone 5-6 MG/ML SOLN, Apply 2 drops to eye 2 (two) times daily as needed., Disp: 1 Bottle, Rfl: 2 .  sertraline (ZOLOFT) 50 MG tablet, Take 1 tablet (50 mg total) by mouth daily., Disp: 30 tablet, Rfl: 5 .  vitamin C (ASCORBIC ACID) 500 MG tablet,  Take 1 tablet (500 mg total) by mouth 2 (two) times daily., Disp: 60 tablet, Rfl: 12  No Known Allergies   ROS  Ten systems reviewed and is negative except as mentioned in HPI  Objective  Vitals:   07/19/16 1158  BP: 118/70  Pulse: 67  Resp: 16  Temp: 98 F (36.7 C)  SpO2: 94%  Weight: 163 lb (73.9 kg)    Body mass index is 24.07 kg/m.  Physical Exam  Constitutional: Patient appears well-developed and well-nourished. Obese  No distress. Sitting on the wheelchair HEENT: head atraumatic, normocephalic, pupils equal and reactive to light, , neck supple, throat within normal limits, tongue lesion on left anterior portion, non - tender - violaceous in color Cardiovascular: Normal rate, regular rhythm and normal heart sounds.  No murmur heard. Trace BLE edema, wearing compression stocking hoses. Pulmonary/Chest: Effort normal and breath sounds normal. No respiratory distress. Abdominal: Soft.  There is no tenderness. Psychiatric: Patient has a normal mood and affect.     PHQ2/9: Depression screen [facility] 2/9 02/14/2016 12/23/2015 11/19/2015 08/19/2015 07/19/2015  Decreased Interest 0 0 0 0 0  Down, Depressed, Hopeless 0 0 0 1 0  PHQ - 2 Score 0 0 0 1 0     Fall Risk: Fall Risk  02/14/2016 12/23/2015 11/19/2015 08/19/2015 07/19/2015  Falls in the past year? Yes Yes Yes No No  Number falls in past yr: 2 or more 2 or more 1 - -  Injury with Fall? Yes Yes No - -  Risk Factor Category  High Fall Risk High Fall Risk - - -  Risk for fall due to : - History of fall(s);Impaired balance/gait;Impaired mobility - - -       Assessment & Plan  1. Nodule of tongue  - Ambulatory referral to ENT

## 2016-07-24 DIAGNOSIS — M6281 Muscle weakness (generalized): Secondary | ICD-10-CM | POA: Diagnosis not present

## 2016-07-24 DIAGNOSIS — R262 Difficulty in walking, not elsewhere classified: Secondary | ICD-10-CM | POA: Diagnosis not present

## 2016-07-31 DIAGNOSIS — R262 Difficulty in walking, not elsewhere classified: Secondary | ICD-10-CM | POA: Diagnosis not present

## 2016-07-31 DIAGNOSIS — M6281 Muscle weakness (generalized): Secondary | ICD-10-CM | POA: Diagnosis not present

## 2016-08-07 DIAGNOSIS — M6281 Muscle weakness (generalized): Secondary | ICD-10-CM | POA: Diagnosis not present

## 2016-08-07 DIAGNOSIS — R262 Difficulty in walking, not elsewhere classified: Secondary | ICD-10-CM | POA: Diagnosis not present

## 2016-08-08 ENCOUNTER — Ambulatory Visit (INDEPENDENT_AMBULATORY_CARE_PROVIDER_SITE_OTHER): Payer: Medicare Other | Admitting: Family Medicine

## 2016-08-08 ENCOUNTER — Other Ambulatory Visit: Payer: Self-pay | Admitting: Family Medicine

## 2016-08-08 ENCOUNTER — Encounter: Payer: Self-pay | Admitting: Family Medicine

## 2016-08-08 VITALS — BP 142/76 | HR 72 | Temp 98.2°F | Resp 18

## 2016-08-08 DIAGNOSIS — R600 Localized edema: Secondary | ICD-10-CM

## 2016-08-08 DIAGNOSIS — I872 Venous insufficiency (chronic) (peripheral): Secondary | ICD-10-CM

## 2016-08-08 DIAGNOSIS — R030 Elevated blood-pressure reading, without diagnosis of hypertension: Secondary | ICD-10-CM | POA: Diagnosis not present

## 2016-08-08 DIAGNOSIS — F4321 Adjustment disorder with depressed mood: Secondary | ICD-10-CM

## 2016-08-08 MED ORDER — IBUPROFEN 800 MG PO TABS
800.0000 mg | ORAL_TABLET | Freq: Three times a day (TID) | ORAL | 0 refills | Status: DC | PRN
Start: 2016-08-08 — End: 2017-07-27

## 2016-08-08 NOTE — Telephone Encounter (Signed)
Patient requesting refill of Alprazolam to [facility].

## 2016-08-08 NOTE — Progress Notes (Addendum)
Name: [name]   MRN: [id number]    DOB: 04-25-1940   Date:08/08/2016       Progress Note  Subjective  Chief Complaint  Chief Complaint  Patient presents with  . Hypertension  . Foot Swelling    HPI  Pt presents with caregiver, history is taken from caregiver.   HTN: Pt had elevated BP last night - first reading was 190/100, the staff did not give PRN medications, but re-checked about an hour later and BP was 158/90. Pt did not have any behavioral changes or complain of chest pain and did not exhibit any shortness of breath. BP in office today is 142/76.  BLE Edema: Caregiver notes patient has had increase in edema in feet for unknown amount of time.  Pt wearing compression stockings that are prescribed by vascular.  His last appointment with vascular was 05/01/2016 with Dr. Delana Meyer who stated patient will follow up PRN.  Pt has not complained of pain, caregivers have not noticed any redness or heat. Pt is wheelchair dependent, so gait unable to be assessed.   Patient Active Problem List   Diagnosis Date Noted  . Late effect acute polio 05/17/2016  . Chronic constipation 11/19/2015  . Mild major depression ([place]) 11/19/2015  . Complicated grieving 12/87/8676  . Recurrent UTI 09/11/2015  . Adenocarcinoma of prostate ([place]) 03/02/2015  . Amyotrophia 03/02/2015  . Edema leg 03/02/2015  . Cerebral palsy ([place]) 03/02/2015  . Dyslipidemia 03/02/2015  . Dermatitis, eczematoid 03/02/2015  . 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 H/O acute poliomyelitis 03/02/2015  . Acquired lymphedema 03/02/2015  . Mental retardation 03/02/2015  . Hypertrophy of nail 03/02/2015  . Adiposity 03/02/2015  . Absence of bladder continence 03/02/2015  . Chronic venous insufficiency 03/02/2015  . Avitaminosis D 03/02/2015  . Adynamia 03/02/2015  . Dependent on wheelchair 03/02/2015  . Kidney cysts 03/02/2015  . At risk for falling 03/02/2015  . Cerebral vascular disease 03/02/2015  . Strabismus  02/11/2015  . Urge incontinence of urine 02/11/2015  . Wheelchair dependence 02/11/2015  . Obesity 02/11/2015  . Urinary frequency 10/30/2014    Social History  Substance Use Topics  . Smoking status: Never Smoker  . Smokeless tobacco: Never Used  . Alcohol use No     Current Outpatient Prescriptions:  .  alfuzosin (UROXATRAL) 10 MG 24 hr tablet, Take 1 tablet (10 mg total) by mouth daily with breakfast., Disp: 90 tablet, Rfl: 3 .  ALPRAZolam (XANAX) 0.25 MG tablet, Take 1 tablet (0.25 mg total) by mouth 2 (two) times daily as needed for anxiety., Disp: 20 tablet, Rfl: 0 .  aspirin 81 MG tablet, 1 tablet daily, Disp: 30 tablet, Rfl: 0 .  betamethasone dipropionate (DIPROLENE) 0.05 % cream, Apply topically 2 (two) times daily. (Patient taking differently: Apply topically as needed. ), Disp: 90 g, Rfl: 1 .  Calcium Carb-Cholecalciferol 500-400 MG-UNIT TABS, TAKE ONE TABLET BY MOUTH 2 TIMES A DAY, Disp: 60 tablet, Rfl: 12 .  cloNIDine (CATAPRES) 0.1 MG tablet, Take 1 tablet (0.1 mg total) by mouth 3 (three) times daily. (Patient taking differently: Take 0.1 mg by mouth as needed. ), Disp: 30 tablet, Rfl: 0 .  Cranberry 425 MG CAPS, Take 1 capsule by mouth daily., Disp: , Rfl:  .  Elastic Bandages & Supports (MEDICAL COMPRESSION STOCKINGS) MISC, 1 Units by Does not apply route daily., Disp: 2 each, Rfl: 0 .  ibuprofen (ADVIL,MOTRIN) 800 MG tablet, Take 1 tablet (800 mg total) by mouth every 8 (eight)  hours as needed., Disp: 30 tablet, Rfl: 0 .  polyethylene glycol powder (GLYCOLAX/MIRALAX) powder, Take 17 g by mouth daily as needed., Disp: 3350 g, Rfl: 5 .  Polyvinyl Alcohol-Povidone 5-6 MG/ML SOLN, Apply 2 drops to eye 2 (two) times daily as needed., Disp: 1 Bottle, Rfl: 2 .  sertraline (ZOLOFT) 50 MG tablet, Take 1 tablet (50 mg total) by mouth daily., Disp: 30 tablet, Rfl: 5 .  vitamin C (ASCORBIC ACID) 500 MG tablet, Take 1 tablet (500 mg total) by mouth 2 (two) times daily., Disp: 60  tablet, Rfl: 12  No Known Allergies  ROS Constitutional: Negative for fever or weight change. Obese. Respiratory: Negative for cough and shortness of breath.   Cardiovascular: Negative for chest pain or palpitations.  Gastrointestinal: Negative for abdominal pain, no bowel changes.  Musculoskeletal:  Positive for BLE edema; pt is wheelchair dependent. Skin: Negative for rash.  Neurological: Negative for dizziness or headache.  No other specific complaints in a complete review of systems (except as listed in HPI above).  Objective  Vitals:   08/08/16 0859  BP: (!) 142/76  Pulse: 72  Resp: 18  Temp: 98.2 F (36.8 C)  TempSrc: Oral  SpO2: 94%    There is no height or weight on file to calculate BMI.  Nursing Note and Vital Signs reviewed.  Physical Exam  Constitutional: Patient appears well-developed and well-nourished. Obese, No distress.  HEENT: head atraumatic, normocephalic Cardiovascular: Normal rate, regular rhythm, S1/S2 present.  No murmur or rub heard. Pulmonary/Chest: Effort normal and breath sounds clear. No respiratory distress or retractions. Peripheral Vascular/Skin: BLE edema +1 on ankles, +2 on feet. DP pulses present - right weak at +1, left +2. No ulceration or skin breakdown noted. No calf pain on palpation or or erythema. Abdominal: Soft and non-tender, bowel sounds present x4 quadrants. Psychiatric: Patient has a normal mood and affect. behavior is at baseline. Judgment and thought content at baseline.  No results found for this or any previous visit (from the past 2160 hour(s)).  Assessment & Plan  1. Elevated blood pressure reading -Education provided on when and how to give PRN Clonidine and alprazolam for BP spikes. -Request BP log to be brought with patient at next visit which is scheduled for 09/14/2016 -Discussed triggers such as anxiety and allowing patient time to recover from an episode of anxiety before re-checking BP - Red flags and when  to present for emergency care including shortness of breath, chest pain, change in mental status, weakness, vision changes, and elevated BP not relieved with the above intervations reviewed with patient  at time of visit. Follow up and care instructions discussed and provided in AVS.  2. Chronic venous insufficiency -Compression stockings, elevate feet PRN -Schedule follow-up with Vascular as discussed.  3. Edema leg -Advised to continue wearing compression stockings during the day and to elevate feet PRN throughout the day.  I have reviewed this encounter including the documentation in this note and/or discussed this patient with the Johney Maine, FNP, NP-C. I am certifying that I agree with the content of this note as supervising physician.  Steele Sizer, MD [facility] 08/08/2016, 5:24 PM

## 2016-08-08 NOTE — Patient Instructions (Addendum)
Please have caregivers review PRN ("As needed") orders for Alprazolam and Clonidine as documented on your MAR.  Please elevate feet as discussed throughout the day. Please contact vascular surgeon to schedule a follow-up appointment Please monitor blood pressure daily and bring log to next visit in May.

## 2016-08-10 DIAGNOSIS — D3702 Neoplasm of uncertain behavior of tongue: Secondary | ICD-10-CM | POA: Diagnosis not present

## 2016-08-14 DIAGNOSIS — M6281 Muscle weakness (generalized): Secondary | ICD-10-CM | POA: Diagnosis not present

## 2016-08-14 DIAGNOSIS — R262 Difficulty in walking, not elsewhere classified: Secondary | ICD-10-CM | POA: Diagnosis not present

## 2016-08-21 ENCOUNTER — Encounter (INDEPENDENT_AMBULATORY_CARE_PROVIDER_SITE_OTHER): Payer: Self-pay | Admitting: Vascular Surgery

## 2016-08-21 ENCOUNTER — Ambulatory Visit (INDEPENDENT_AMBULATORY_CARE_PROVIDER_SITE_OTHER): Payer: Medicare Other | Admitting: Vascular Surgery

## 2016-08-21 VITALS — BP 135/72 | HR 68 | Resp 16 | Ht 64.0 in

## 2016-08-21 DIAGNOSIS — I872 Venous insufficiency (chronic) (peripheral): Secondary | ICD-10-CM

## 2016-08-21 DIAGNOSIS — R262 Difficulty in walking, not elsewhere classified: Secondary | ICD-10-CM | POA: Diagnosis not present

## 2016-08-21 DIAGNOSIS — Z993 Dependence on wheelchair: Secondary | ICD-10-CM

## 2016-08-21 DIAGNOSIS — I679 Cerebrovascular disease, unspecified: Secondary | ICD-10-CM | POA: Diagnosis not present

## 2016-08-21 DIAGNOSIS — I890 Lymphedema, not elsewhere classified: Secondary | ICD-10-CM

## 2016-08-21 DIAGNOSIS — M6281 Muscle weakness (generalized): Secondary | ICD-10-CM | POA: Diagnosis not present

## 2016-08-21 NOTE — Progress Notes (Signed)
MRN : [id number]  [name] is a 76 y.o. (December 01, 1940) male who presents with chief complaint of  Chief Complaint  Patient presents with  . Re-evaluation    Leg swelling  .  History of Present Illness: The patient returns to the office for followup evaluation regarding leg swelling.  The swelling has persisted and the pain associated with swelling continues. There have not been any interval development of a ulcerations or wounds.  Since the previous visit the patient has been wearing graduated compression stockings and has noted little if any improvement in the lymphedema. The patient has been using compression routinely morning until night.  The patient also states elevation during the day and exercise is being done too.  No outpatient prescriptions have been marked as taking for the 08/21/16 encounter (Office Visit) with Katha Cabal, MD.    Past Medical History:  Diagnosis Date  . Atrophy, disuse, muscle   . BPH (benign prostatic hyperplasia)   . Cerebral palsy ([place])   . Eczema   . Elevated PSA   . Exotropia   . Hematuria, gross   . Hyperlipidemia   . Incontinence   . Lower urinary tract symptoms   . Lymphedema   . Mental retardation   . Over weight   . Prostate cancer ([place])   . Renal cyst   . Urinary frequency   . Urinary urgency   . Venous insufficiency   . Vitamin D deficiency   . Wheelchair dependence     Past Surgical History:  Procedure Laterality Date  . leg circulation surgery Right   . PROSTATE SURGERY  11/10/2008   BRACHYTHERAPY    Social History Social History  Substance Use Topics  . Smoking status: Never Smoker  . Smokeless tobacco: Never Used  . Alcohol use No    Family History Family History  Problem Relation Age of Onset  . Heart attack Brother   . Heart disease      No Known Allergies   REVIEW OF SYSTEMS (Negative unless checked)  Constitutional: [] Weight loss  [] Fever  [] Chills Cardiac: [] Chest pain   [] Chest  pressure   [] Palpitations   [] Shortness of breath when laying flat   [] Shortness of breath with exertion. Vascular:  [] Pain in legs with walking   [x] Pain in legs at rest  [] History of DVT   [] Phlebitis   [x] Swelling in legs   [] Varicose veins   [] Non-healing ulcers Pulmonary:   [] Uses home oxygen   [] Productive cough   [] Hemoptysis   [] Wheeze  [] COPD   [] Asthma Neurologic:  [] Dizziness   [] Seizures   [] History of stroke   [] History of TIA  [] Aphasia   [] Vissual changes   [] Weakness or numbness in arm   [] Weakness or numbness in leg Musculoskeletal:   [] Joint swelling   [] Joint pain   [] Low back pain Hematologic:  [] Easy bruising  [] Easy bleeding   [] Hypercoagulable state   [] Anemic Gastrointestinal:  [] Diarrhea   [] Vomiting  [] Gastroesophageal reflux/heartburn   [] Difficulty swallowing. Genitourinary:  [] Chronic kidney disease   [] Difficult urination  [] Frequent urination   [] Blood in urine Skin:  [] Rashes   [] Ulcers  Psychological:  [] History of anxiety   []  History of major depression.  Physical Examination  Vitals:   08/21/16 0853  BP: 135/72  Pulse: 68  Resp: 16  Height: 5\' 4"  (1.626 m)   There is no height or weight on file to calculate BMI. Gen: WD/WN, NAD  Wheelchair bound Head: [place]/AT, No  temporalis wasting.  Ear/Nose/Throat: Hearing grossly intact, nares w/o erythema or drainage Eyes: PER, EOMI, sclera nonicteric.  Neck: Supple, no large masses.   Pulmonary:  Good air movement, no audible wheezing bilaterally, no use of accessory muscles.  Cardiac: RRR, no JVD Vascular:  Venous changes bilaterally with 3+ edema Vessel Right Left  Radial Palpable Palpable  Ulnar Palpable Palpable  PT Palpable Palpable  DP Palpable Palpable  Gastrointestinal: Non-distended. No guarding/no peritoneal signs.  Musculoskeletal: M/S 2/5 both legs.  + deformity and atrophy.  Neurologic: CN 2-12 intact. Symmetrical.  Speech is a bit broken. Motor exam as listed above. Psychiatric: Judgment  intact, Mood & affect appropriate for pt's clinical situation. Dermatologic: No rashes or ulcers noted.  No changes consistent with cellulitis. Lymph : No lichenification or skin changes of chronic lymphedema.  CBC Lab Results  Component Value Date   WBC 5.5 12/23/2015   HGB 14.7 12/23/2015   HCT 41.9 12/23/2015   MCV 93.6 12/23/2015   PLT 149 (L) 12/23/2015    BMET    Component Value Date/Time   NA 138 12/23/2015 1727   NA 139 02/11/2015 1404   NA 139 08/14/2013 1618   K 3.8 12/23/2015 1727   K 3.8 08/14/2013 1618   CL 105 12/23/2015 1727   CL 108 (H) 08/14/2013 1618   CO2 26 12/23/2015 1727   CO2 28 08/14/2013 1618   GLUCOSE 100 (H) 12/23/2015 1727   GLUCOSE 99 08/14/2013 1618   BUN 14 12/23/2015 1727   BUN 11 02/11/2015 1404   BUN 14 08/14/2013 1618   CREATININE 0.68 12/23/2015 1727   CREATININE 0.71 08/14/2013 1618   CALCIUM 9.0 12/23/2015 1727   CALCIUM 8.4 (L) 08/14/2013 1618   GFRNONAA >60 12/23/2015 1727   GFRNONAA >60 08/14/2013 1618   GFRAA >60 12/23/2015 1727   GFRAA >60 08/14/2013 1618   CrCl cannot be calculated (Patient's most recent lab result is older than the maximum 21 days allowed.).  COAG No results found for: INR, PROTIME  Radiology No results found.  Assessment/Plan 1. Chronic venous insufficiency Recommend:  No surgery or intervention at this point in time.    I have reviewed my previous discussion with the patient regarding swelling and why it causes symptoms.  Patient will continue wearing graduated compression stockings class 1 (20-30 mmHg) on a daily basis. The patient will  beginning wearing the stockings first thing in the morning and removing them in the evening. The patient is instructed specifically not to sleep in the stockings.    In addition, behavioral modification including several periods of elevation of the lower extremities during the day will be continued.  This was reviewed with the patient during the initial  visit.  The patient will also continue routine exercise, especially walking on a daily basis as was discussed during the initial visit.    Despite conservative treatments including graduated compression therapy class 1 and behavioral modification including exercise and elevation the patient  has not obtained adequate control of the lymphedema.  The patient still has stage 3 lymphedema and therefore, I believe that a lymph pump should be added to improve the control of the patient's lymphedema.  Additionally, a lymph pump is warranted because it will reduce the risk of cellulitis and ulceration in the future.  Patient should follow-up in six months    2. Lymphedema Recommend:  No surgery or intervention at this point in time.    I have reviewed my previous discussion with the patient  regarding swelling and why it causes symptoms.  Patient will continue wearing graduated compression stockings class 1 (20-30 mmHg) on a daily basis. The patient will  beginning wearing the stockings first thing in the morning and removing them in the evening. The patient is instructed specifically not to sleep in the stockings.    In addition, behavioral modification including several periods of elevation of the lower extremities during the day will be continued.  This was reviewed with the patient during the initial visit.  The patient will also continue routine exercise, especially walking on a daily basis as was discussed during the initial visit.    Despite conservative treatments including graduated compression therapy class 1 and behavioral modification including exercise and elevation the patient  has not obtained adequate control of the lymphedema.  The patient still has stage 3 lymphedema and therefore, I believe that a lymph pump should be added to improve the control of the patient's lymphedema.  Additionally, a lymph pump is warranted because it will reduce the risk of cellulitis and ulceration in the  future.  Patient should follow-up in six months    3. Wheelchair dependence Stable no change  4. Cerebral vascular disease Continue anticoagulation    Hortencia Pilar, MD  08/21/2016 10:13 AM

## 2016-08-26 ENCOUNTER — Other Ambulatory Visit: Payer: Self-pay | Admitting: Urology

## 2016-08-26 DIAGNOSIS — R350 Frequency of micturition: Secondary | ICD-10-CM

## 2016-08-28 DIAGNOSIS — M6281 Muscle weakness (generalized): Secondary | ICD-10-CM | POA: Diagnosis not present

## 2016-08-28 DIAGNOSIS — R262 Difficulty in walking, not elsewhere classified: Secondary | ICD-10-CM | POA: Diagnosis not present

## 2016-08-31 ENCOUNTER — Other Ambulatory Visit: Payer: Self-pay | Admitting: Urology

## 2016-08-31 DIAGNOSIS — R350 Frequency of micturition: Secondary | ICD-10-CM

## 2016-09-04 DIAGNOSIS — R262 Difficulty in walking, not elsewhere classified: Secondary | ICD-10-CM | POA: Diagnosis not present

## 2016-09-04 DIAGNOSIS — M6281 Muscle weakness (generalized): Secondary | ICD-10-CM | POA: Diagnosis not present

## 2016-09-07 ENCOUNTER — Other Ambulatory Visit: Payer: Self-pay | Admitting: Urology

## 2016-09-07 DIAGNOSIS — R350 Frequency of micturition: Secondary | ICD-10-CM

## 2016-09-11 DIAGNOSIS — R262 Difficulty in walking, not elsewhere classified: Secondary | ICD-10-CM | POA: Diagnosis not present

## 2016-09-11 DIAGNOSIS — M6281 Muscle weakness (generalized): Secondary | ICD-10-CM | POA: Diagnosis not present

## 2016-09-14 ENCOUNTER — Ambulatory Visit: Payer: Medicare Other | Admitting: Family Medicine

## 2016-09-19 DIAGNOSIS — M6281 Muscle weakness (generalized): Secondary | ICD-10-CM | POA: Diagnosis not present

## 2016-09-19 DIAGNOSIS — R262 Difficulty in walking, not elsewhere classified: Secondary | ICD-10-CM | POA: Diagnosis not present

## 2016-09-25 DIAGNOSIS — R262 Difficulty in walking, not elsewhere classified: Secondary | ICD-10-CM | POA: Diagnosis not present

## 2016-09-25 DIAGNOSIS — M6281 Muscle weakness (generalized): Secondary | ICD-10-CM | POA: Diagnosis not present

## 2016-10-02 ENCOUNTER — Encounter: Payer: Self-pay | Admitting: Family Medicine

## 2016-10-02 ENCOUNTER — Other Ambulatory Visit: Payer: Self-pay | Admitting: Urology

## 2016-10-02 ENCOUNTER — Ambulatory Visit (INDEPENDENT_AMBULATORY_CARE_PROVIDER_SITE_OTHER): Payer: Medicare Other | Admitting: Family Medicine

## 2016-10-02 VITALS — BP 124/68 | HR 80 | Temp 97.6°F | Resp 16 | Wt 163.0 lb

## 2016-10-02 DIAGNOSIS — I872 Venous insufficiency (chronic) (peripheral): Secondary | ICD-10-CM

## 2016-10-02 DIAGNOSIS — R350 Frequency of micturition: Secondary | ICD-10-CM

## 2016-10-02 DIAGNOSIS — K5909 Other constipation: Secondary | ICD-10-CM | POA: Diagnosis not present

## 2016-10-02 DIAGNOSIS — R739 Hyperglycemia, unspecified: Secondary | ICD-10-CM | POA: Diagnosis not present

## 2016-10-02 DIAGNOSIS — L308 Other specified dermatitis: Secondary | ICD-10-CM | POA: Diagnosis not present

## 2016-10-02 DIAGNOSIS — N39498 Other specified urinary incontinence: Secondary | ICD-10-CM | POA: Diagnosis not present

## 2016-10-02 DIAGNOSIS — E559 Vitamin D deficiency, unspecified: Secondary | ICD-10-CM | POA: Diagnosis not present

## 2016-10-02 MED ORDER — BETAMETHASONE DIPROPIONATE 0.05 % EX CREA: TOPICAL_CREAM | CUTANEOUS | 1 refills | Status: DC | PRN

## 2016-10-02 MED ORDER — DEPEND SILHOUETTE BRIEFS L/XL MISC: 1.0000 | Freq: Four times a day (QID) | 5 refills | Status: DC

## 2016-10-02 NOTE — Progress Notes (Signed)
Name: [name]   MRN: [id number]    DOB: [birth year]   Date:10/02/2016       Progress Note  Subjective  Chief Complaint  Chief Complaint  Patient presents with  . Urinary Incontinence  . mental retardation    HPI  Chronic constipation: caregiver [name] ) came in with him today, she states he has a long history of constipation and he drinks prune juice and also has fiber bars and is on Miralax since the Summer 2017, stools are soft and daily. He is using miralax every three days to control constipation  Recurrent UTI: with positive E.coli culture multiple times. He is a colonizer, urine odor resolved and no longer has hematuria. 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 He is off Macrobid and is on Uroxatral, he denies any abdominal pain, no urine odor, however caregivers states he is having more frequent urination and incontinent episodes, needs to wear depends and change multiple times daily. No change in behavior or fever.   Mild depression: resolved, secondary to grieving, feeling well now.   MR: lives in a group home, came in with caregivers.   Chronic venous insufficiency: seen by Dr. Lucky Cowboy and is wearing a venous pump twice daily and is doing well  Dermatitis: he uses cream prn, but he needs a refill today. No itching at this time.    Patient Active Problem List   Diagnosis Date Noted  . Late effect acute polio 05/17/2016  . Chronic constipation 11/19/2015  . Mild major depression ([place]) 11/19/2015  . Complicated grieving 67/03/4579  . Recurrent UTI 09/11/2015  . Adenocarcinoma of prostate ([place]) 03/02/2015  . Amyotrophia 03/02/2015  . Edema leg 03/02/2015  . Cerebral palsy ([place]) 03/02/2015  . Dyslipidemia 03/02/2015  . Dermatitis, eczematoid 03/02/2015  . 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 H/O acute poliomyelitis 03/02/2015  . Lymphedema 03/02/2015  . Mental retardation 03/02/2015  . Hypertrophy of nail 03/02/2015  . Adiposity 03/02/2015  . Absence of bladder  continence 03/02/2015  . Chronic venous insufficiency 03/02/2015  . Avitaminosis D 03/02/2015  . Adynamia 03/02/2015  . Dependent on wheelchair 03/02/2015  . Kidney cysts 03/02/2015  . At risk for falling 03/02/2015  . Cerebral vascular disease 03/02/2015  . Strabismus 02/11/2015  . Urge incontinence of urine 02/11/2015  . Wheelchair dependence 02/11/2015  . Obesity 02/11/2015  . Urinary frequency 10/30/2014    Past Surgical History:  Procedure Laterality Date  . leg circulation surgery Right   . PROSTATE SURGERY  11/10/2008   BRACHYTHERAPY    Family History  Problem Relation Age of Onset  . Heart attack Brother   . Heart disease Unknown     Social History   Social History  . Marital status: Single    Spouse name: N/A  . Number of children: N/A  . Years of education: N/A   Occupational History  . Not on file.   Social History Main Topics  . Smoking status: Never Smoker  . Smokeless tobacco: Never Used  . Alcohol use No  . Drug use: No  . Sexual activity: No   Other Topics Concern  . Not on file   Social History Narrative   Lives at [facility] group home, 5 males.          Current Outpatient Prescriptions:  .  alfuzosin (UROXATRAL) 10 MG 24 hr tablet, Take 1 tablet (10 mg total) by mouth daily with breakfast., Disp: 90 tablet, Rfl: 3 .  ALPRAZolam (XANAX) 0.25 MG  tablet, TAKE ONE TABLET BY MOUTH 2 TIMES A DAY AS NEEDED FOR ANXIETY (WHEN BLOOD PRESSURE IS VERY HIGH OR ANXIOUS), Disp: 20 tablet, Rfl: 0 .  aspirin 81 MG tablet, 1 tablet daily, Disp: 30 tablet, Rfl: 0 .  betamethasone dipropionate (DIPROLENE) 0.05 % cream, Apply topically as needed., Disp: 45 g, Rfl: 1 .  Calcium Carb-Cholecalciferol 500-400 MG-UNIT TABS, TAKE ONE TABLET BY MOUTH 2 TIMES A DAY, Disp: 60 tablet, Rfl: 12 .  cloNIDine (CATAPRES) 0.1 MG tablet, Take 1 tablet (0.1 mg total) by mouth 3 (three) times daily. (Patient taking differently: Take 0.1 mg by mouth as needed. ), Disp: 30  tablet, Rfl: 0 .  Cranberry 425 MG CAPS, Take 1 capsule by mouth daily., Disp: , Rfl:  .  Elastic Bandages & Supports (MEDICAL COMPRESSION STOCKINGS) MISC, 1 Units by Does not apply route daily., Disp: 2 each, Rfl: 0 .  ibuprofen (ADVIL,MOTRIN) 800 MG tablet, Take 1 tablet (800 mg total) by mouth every 8 (eight) hours as needed., Disp: 30 tablet, Rfl: 0 .  Incontinence Supply Disposable (DEPEND SILHOUETTE BRIEFS L/XL) MISC, 1 each by Does not apply route 4 (four) times daily., Disp: 120 each, Rfl: 5 .  polyethylene glycol powder (GLYCOLAX/MIRALAX) powder, Take 17 g by mouth daily as needed., Disp: 3350 g, Rfl: 5 .  Polyvinyl Alcohol-Povidone 5-6 MG/ML SOLN, Apply 2 drops to eye 2 (two) times daily as needed., Disp: 1 Bottle, Rfl: 2 .  sertraline (ZOLOFT) 50 MG tablet, Take 1 tablet (50 mg total) by mouth daily., Disp: 30 tablet, Rfl: 5 .  vitamin C (ASCORBIC ACID) 500 MG tablet, Take 1 tablet (500 mg total) by mouth 2 (two) times daily., Disp: 60 tablet, Rfl: 12  No Known Allergies   ROS  Constitutional: Negative for fever, no weight today Respiratory: Negative for cough and shortness of breath.   Cardiovascular: Negative for chest pain or palpitations.  Gastrointestinal: Negative for abdominal pain, no bowel changes.  Musculoskeletal: Positive  for gait problem , no joint swelling.  Skin: Positive  for intermittent rash.  Neurological: Negative for dizziness or headache.  No other specific complaints in a complete review of systems (except as listed in HPI above).  Objective  Vitals:   10/02/16 0900  BP: 124/68  Pulse: 80  Resp: 16  Temp: 97.6 F (36.4 C)  SpO2: 97%  Weight: 163 lb (73.9 kg)    Body mass index is 27.98 kg/m.  Physical Exam  Constitutional: Obese No distress.  HEENT: head atraumatic, normocephalic,  strabismus,  neck supple, throat within normal limits Cardiovascular: Normal rate, regular rhythm and normal heart sounds.  No murmur heard. 1 plus bilateral  ankle  edema.  Pulmonary/Chest: Effort normal and breath sounds normal. No respiratory distress. Abdominal: Soft.  There is no tenderness. Negative CVA tenderness Psychiatric: Patient has a normal mood and affect. behavior is normal. Judgment and thought content normal. Muscular Skeletal: wheelchair bound, atrophic legs ( he had to be assisted to check  his weight )  PHQ2/9: Depression screen [facility] 2/9 02/14/2016 12/23/2015 11/19/2015 08/19/2015 07/19/2015  Decreased Interest 0 0 0 0 0  Down, Depressed, Hopeless 0 0 0 1 0  PHQ - 2 Score 0 0 0 1 0     Fall Risk: Fall Risk  02/14/2016 12/23/2015 11/19/2015 08/19/2015 07/19/2015  Falls in the past year? Yes Yes Yes No No  Number falls in past yr: 2 or more 2 or more 1 - -  Injury with Fall? Yes  Yes No - -  Risk Factor Category  High Fall Risk High Fall Risk - - -  Risk for fall due to : - History of fall(s);Impaired balance/gait;Impaired mobility - - -     Assessment & Plan  1. Other urinary incontinence  - Urine Culture - Incontinence Supply Disposable (DEPEND SILHOUETTE BRIEFS L/XL) MISC; 1 each by Does not apply route 4 (four) times daily.  Dispense: 120 each; Refill: 5  2. Other eczema  - betamethasone dipropionate (DIPROLENE) 0.05 % cream; Apply topically as needed.  Dispense: 45 g; Refill: 1  3. Hyperglycemia  - Hemoglobin A1c  4. Vitamin D deficiency  - VITAMIN D 25 Hydroxy (Vit-D Deficiency, Fractures)  5. Chronic constipation  Continue Miralax as directed  6. Chronic venous insufficiency  Doing well with pump

## 2016-10-03 DIAGNOSIS — N39498 Other specified urinary incontinence: Secondary | ICD-10-CM | POA: Diagnosis not present

## 2016-10-03 LAB — VITAMIN D 25 HYDROXY (VIT D DEFICIENCY, FRACTURES): VIT D 25 HYDROXY: 36 ng/mL (ref 30–100)

## 2016-10-03 LAB — HEMOGLOBIN A1C
Hgb A1c MFr Bld: 4.7 % (ref <5.7)
MEAN PLASMA GLUCOSE: 88 mg/dL

## 2016-10-03 NOTE — Addendum Note (Signed)
Addended by: Lolita Rieger D on: 10/03/2016 08:54 AM   Modules accepted: Orders

## 2016-10-04 LAB — URINE CULTURE: ORGANISM ID, BACTERIA: NO GROWTH

## 2016-10-05 ENCOUNTER — Telehealth: Payer: Self-pay

## 2016-10-05 NOTE — Telephone Encounter (Signed)
Left message for patient to return my call regarding labs.  

## 2016-10-05 NOTE — Telephone Encounter (Signed)
-----   Message from Steele Sizer, MD sent at 10/05/2016  1:33 PM EDT ----- No UTI Normal hgbA1C and vitamin D

## 2016-10-09 DIAGNOSIS — M6281 Muscle weakness (generalized): Secondary | ICD-10-CM | POA: Diagnosis not present

## 2016-10-09 DIAGNOSIS — R262 Difficulty in walking, not elsewhere classified: Secondary | ICD-10-CM | POA: Diagnosis not present

## 2016-10-16 DIAGNOSIS — M6281 Muscle weakness (generalized): Secondary | ICD-10-CM | POA: Diagnosis not present

## 2016-10-16 DIAGNOSIS — R262 Difficulty in walking, not elsewhere classified: Secondary | ICD-10-CM | POA: Diagnosis not present

## 2016-10-22 DIAGNOSIS — S300XXA Contusion of lower back and pelvis, initial encounter: Secondary | ICD-10-CM | POA: Diagnosis not present

## 2016-10-22 DIAGNOSIS — R030 Elevated blood-pressure reading, without diagnosis of hypertension: Secondary | ICD-10-CM | POA: Diagnosis not present

## 2016-10-22 DIAGNOSIS — W19XXXA Unspecified fall, initial encounter: Secondary | ICD-10-CM | POA: Diagnosis not present

## 2016-10-22 DIAGNOSIS — R42 Dizziness and giddiness: Secondary | ICD-10-CM | POA: Diagnosis not present

## 2016-10-26 ENCOUNTER — Ambulatory Visit (INDEPENDENT_AMBULATORY_CARE_PROVIDER_SITE_OTHER): Payer: Medicare Other | Admitting: Family Medicine

## 2016-10-26 ENCOUNTER — Encounter: Payer: Self-pay | Admitting: Family Medicine

## 2016-10-26 VITALS — BP 154/72 | HR 73 | Temp 98.2°F | Resp 18 | Ht 64.0 in | Wt 163.0 lb

## 2016-10-26 DIAGNOSIS — R030 Elevated blood-pressure reading, without diagnosis of hypertension: Secondary | ICD-10-CM

## 2016-10-26 DIAGNOSIS — Z9181 History of falling: Secondary | ICD-10-CM

## 2016-10-26 DIAGNOSIS — I10 Essential (primary) hypertension: Secondary | ICD-10-CM | POA: Insufficient documentation

## 2016-10-26 MED ORDER — HYDROCHLOROTHIAZIDE 12.5 MG PO TABS: 12.5000 mg | ORAL_TABLET | Freq: Every day | ORAL | 0 refills | Status: DC

## 2016-10-26 NOTE — Patient Instructions (Addendum)
-Check blood pressure in the morning, If patient drops below 110/70, hold HCTZ that morning. -Please continue his other medications as prescribed. -Please bring a blood pressure/mood/medications given log again to 1 month follow up.  How to Take Your Blood Pressure You can take your blood pressure at home with a machine. You may need to check your blood pressure at home:  To check if you have high blood pressure (hypertension).  To check your blood pressure over time.  To make sure your blood pressure medicine is working.  Supplies needed: You will need a blood pressure machine, or monitor. You can buy one at a drugstore or online. When choosing one:  Choose one with an arm cuff.  Choose one that wraps around your upper arm. Only one finger should fit between your arm and the cuff.  Do not choose one that measures your blood pressure from your wrist or finger.  Your doctor can suggest a monitor. How to prepare Avoid these things for 30 minutes before checking your blood pressure:  Drinking caffeine.  Drinking alcohol.  Eating.  Smoking.  Exercising.  Five minutes before checking your blood pressure:  Pee.  Sit in a dining chair. Avoid sitting in a soft couch or armchair.  Be quiet. Do not talk.  How to take your blood pressure Follow the instructions that came with your machine. If you have a digital blood pressure monitor, these may be the instructions: 1. Sit up straight. 2. Place your feet on the floor. Do not cross your ankles or legs. 3. Rest your left arm at the level of your heart. You may rest it on a table, desk, or chair. 4. Pull up your shirt sleeve. 5. Wrap the blood pressure cuff around the upper part of your left arm. The cuff should be 1 inch (2.5 cm) above your elbow. It is best to wrap the cuff around bare skin. 6. Fit the cuff snugly around your arm. You should be able to place only one finger between the cuff and your arm. 7. Put the cord inside  the groove of your elbow. 8. Press the power button. 9. Sit quietly while the cuff fills with air and loses air. 10. Write down the numbers on the screen. 11. Wait 2-3 minutes and then repeat steps 1-10.  What do the numbers mean? Two numbers make up your blood pressure. The first number is called systolic pressure. The second is called diastolic pressure. An example of a blood pressure reading is "120 over 80" (or 120/80). If you are an adult and do not have a medical condition, use this guide to find out if your blood pressure is normal: Normal  First number: below 120.  Second number: below 80. Elevated  First number: 120-129.  Second number: below 80. Hypertension stage 1  First number: 130-139.  Second number: 80-89. Hypertension stage 2  First number: 140 or above.  Second number: 55 or above. Your blood pressure is above normal even if only the top or bottom number is above normal. Follow these instructions at home:  Check your blood pressure as often as your doctor tells you to.  Take your monitor to your next doctor's appointment. Your doctor will: ? Make sure you are using it correctly. ? Make sure it is working right.  Make sure you understand what your blood pressure numbers should be.  Tell your doctor if your medicines are causing side effects. Contact a doctor if:  Your blood pressure keeps  being high. Get help right away if:  Your first blood pressure number is higher than 180.  Your second blood pressure number is higher than 120. This information is not intended to replace advice given to you by your health care provider. Make sure you discuss any questions you have with your health care provider. Document Released: 03/23/2008 Document Revised: 03/08/2016 Document Reviewed: 09/17/2015 Elsevier Interactive Patient Education  2018 [employer].  Hypertension Hypertension is another name for high blood pressure. High blood pressure forces your  heart to work harder to pump blood. This can cause problems over time. There are two numbers in a blood pressure reading. There is a top number (systolic) over a bottom number (diastolic). It is best to have a blood pressure below 120/80. Healthy choices can help lower your blood pressure. You may need medicine to help lower your blood pressure if:  Your blood pressure cannot be lowered with healthy choices.  Your blood pressure is higher than 130/80.  Follow these instructions at home: Eating and drinking  If directed, follow the DASH eating plan. This diet includes: ? Filling half of your plate at each meal with fruits and vegetables. ? Filling one quarter of your plate at each meal with whole grains. Whole grains include whole wheat pasta, brown rice, and whole grain bread. ? Eating or drinking low-fat dairy products, such as skim milk or low-fat yogurt. ? Filling one quarter of your plate at each meal with low-fat (lean) proteins. Low-fat proteins include fish, skinless chicken, eggs, beans, and tofu. ? Avoiding fatty meat, cured and processed meat, or chicken with skin. ? Avoiding premade or processed food.  Eat less than 1,500 mg of salt (sodium) a day.  Limit alcohol use to no more than 1 drink a day for nonpregnant women and 2 drinks a day for men. One drink equals 12 oz of beer, 5 oz of wine, or 1 oz of hard liquor. Lifestyle  Work with your doctor to stay at a healthy weight or to lose weight. Ask your doctor what the best weight is for you.  Get at least 30 minutes of exercise that causes your heart to beat faster (aerobic exercise) most days of the week. This may include walking, swimming, or biking.  Get at least 30 minutes of exercise that strengthens your muscles (resistance exercise) at least 3 days a week. This may include lifting weights or pilates.  Do not use any products that contain nicotine or tobacco. This includes cigarettes and e-cigarettes. If you need help  quitting, ask your doctor.  Check your blood pressure at home as told by your doctor.  Keep all follow-up visits as told by your doctor. This is important. Medicines  Take over-the-counter and prescription medicines only as told by your doctor. Follow directions carefully.  Do not skip doses of blood pressure medicine. The medicine does not work as well if you skip doses. Skipping doses also puts you at risk for problems.  Ask your doctor about side effects or reactions to medicines that you should watch for. Contact a doctor if:  You think you are having a reaction to the medicine you are taking.  You have headaches that keep coming back (recurring).  You feel dizzy.  You have swelling in your ankles.  You have trouble with your vision. Get help right away if:  You get a very bad headache.  You start to feel confused.  You feel weak or numb.  You feel faint.  You get very bad pain in your: ? Chest. ? Belly (abdomen).  You throw up (vomit) more than once.  You have trouble breathing. Summary  Hypertension is another name for high blood pressure.  Making healthy choices can help lower blood pressure. If your blood pressure cannot be controlled with healthy choices, you may need to take medicine. This information is not intended to replace advice given to you by your health care provider. Make sure you discuss any questions you have with your health care provider. Document Released: 09/27/2007 Document Revised: 03/08/2016 Document Reviewed: 03/08/2016 Elsevier Interactive Patient Education  [employer].

## 2016-10-26 NOTE — Progress Notes (Addendum)
Name: [name]   MRN: [id number]    DOB: 03/06/1941   Date:10/26/2016       Progress Note  Subjective  Chief Complaint  Chief Complaint  Patient presents with  . Hypertension    HPI  History is obtained from Representative from [employer] and from the patient.  PT presents with caregivers from [name] for concern for elevated BP over the last several months. He has been having consistently elevated readings even when calm and in a good mood. Staff gives Xanax when BP is >140/90 and/or patient is anxious; if >160/100, patient is given Clonidine.  He is taking Xanax almost daily for elevated BP. Detailed Log with BP, time of day, mood (anxious vs calm), and medication interventions provided - Lowest 128/78, Highest 190's/100's. Average 160-170's/90's.  No chest pain, shortness of breath, or palpitations. Has chronic venous insufficiency with BLE edema - elevates at night and uses pneumatic compression boots in the mornings, wears TED hose.  Recent fall: Pt fell last week while in the bathroom - no injury from the fall, no behavior changes, no complaints of pain.  He currently has in home physical therapy and they are working on his strengthening.  Patient Active Problem List   Diagnosis Date Noted  . Elevated blood pressure reading 10/26/2016  . Late effect acute polio 05/17/2016  . Chronic constipation 11/19/2015  . Mild major depression ([place]) 11/19/2015  . Complicated grieving 40/98/1191  . Recurrent UTI 09/11/2015  . Adenocarcinoma of prostate ([place]) 03/02/2015  . Amyotrophia 03/02/2015  . Edema leg 03/02/2015  . Cerebral palsy ([place]) 03/02/2015  . Dyslipidemia 03/02/2015  . Dermatitis, eczematoid 03/02/2015  . 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 H/O acute poliomyelitis 03/02/2015  . Lymphedema 03/02/2015  . Mental retardation 03/02/2015  . Hypertrophy of nail 03/02/2015  . Adiposity 03/02/2015  . Absence of bladder continence 03/02/2015  . Chronic venous insufficiency  03/02/2015  . Avitaminosis D 03/02/2015  . Adynamia 03/02/2015  . Dependent on wheelchair 03/02/2015  . Kidney cysts 03/02/2015  . At risk for falling 03/02/2015  . Cerebral vascular disease 03/02/2015  . Strabismus 02/11/2015  . Urge incontinence of urine 02/11/2015  . Wheelchair dependence 02/11/2015  . Obesity 02/11/2015  . Urinary frequency 10/30/2014    Social History  Substance Use Topics  . Smoking status: Never Smoker  . Smokeless tobacco: Never Used  . Alcohol use No     Current Outpatient Prescriptions:  .  alfuzosin (UROXATRAL) 10 MG 24 hr tablet, TAKE 1 TABLET (10MG ) BY MOUTH ONCE DAILY WITH BREAKFAST*DO NOT CRUSH*, Disp: 30 tablet, Rfl: 0 .  ALPRAZolam (XANAX) 0.25 MG tablet, TAKE ONE TABLET BY MOUTH 2 TIMES A DAY AS NEEDED FOR ANXIETY (WHEN BLOOD PRESSURE IS VERY HIGH OR ANXIOUS), Disp: 20 tablet, Rfl: 0 .  aspirin 81 MG tablet, 1 tablet daily, Disp: 30 tablet, Rfl: 0 .  betamethasone dipropionate (DIPROLENE) 0.05 % cream, Apply topically as needed., Disp: 45 g, Rfl: 1 .  Calcium Carb-Cholecalciferol 500-400 MG-UNIT TABS, TAKE ONE TABLET BY MOUTH 2 TIMES A DAY, Disp: 60 tablet, Rfl: 12 .  cloNIDine (CATAPRES) 0.1 MG tablet, Take 1 tablet (0.1 mg total) by mouth 3 (three) times daily. (Patient taking differently: Take 0.1 mg by mouth as needed. ), Disp: 30 tablet, Rfl: 0 .  Cranberry 425 MG CAPS, Take 1 capsule by mouth daily., Disp: , Rfl:  .  Elastic Bandages & Supports (MEDICAL COMPRESSION STOCKINGS) MISC, 1 Units by Does not apply route  daily., Disp: 2 each, Rfl: 0 .  ibuprofen (ADVIL,MOTRIN) 800 MG tablet, Take 1 tablet (800 mg total) by mouth every 8 (eight) hours as needed., Disp: 30 tablet, Rfl: 0 .  Incontinence Supply Disposable (DEPEND SILHOUETTE BRIEFS L/XL) MISC, 1 each by Does not apply route 4 (four) times daily., Disp: 120 each, Rfl: 5 .  polyethylene glycol powder (GLYCOLAX/MIRALAX) powder, Take 17 g by mouth daily as needed., Disp: 3350 g, Rfl: 5 .   Polyvinyl Alcohol-Povidone 5-6 MG/ML SOLN, Apply 2 drops to eye 2 (two) times daily as needed., Disp: 1 Bottle, Rfl: 2 .  sertraline (ZOLOFT) 50 MG tablet, Take 1 tablet (50 mg total) by mouth daily., Disp: 30 tablet, Rfl: 5 .  vitamin C (ASCORBIC ACID) 500 MG tablet, Take 1 tablet (500 mg total) by mouth 2 (two) times daily., Disp: 60 tablet, Rfl: 12 .  hydrochlorothiazide (HYDRODIURIL) 12.5 MG tablet, Take 1 tablet (12.5 mg total) by mouth daily., Disp: 30 tablet, Rfl: 0  No Known Allergies  ROS Constitutional: Negative for fever or weight change.  Respiratory: Negative for cough and shortness of breath.   Cardiovascular: Negative for chest pain or palpitations.  Gastrointestinal: Negative for abdominal pain, no bowel changes.  Musculoskeletal: Negative for gait problem or joint swelling.  Skin: Negative for rash.  Neurological: Negative for dizziness or headache.  No other specific complaints in a complete review of systems (except as listed in HPI above).  Objective  Vitals:   10/26/16 0905  BP: (!) 154/72  Pulse: 73  Resp: 18  Temp: 98.2 F (36.8 C)  TempSrc: Oral  SpO2: 97%  Weight: 163 lb (73.9 kg)  Height: 5\' 4"  (1.626 m)    Body mass index is 27.98 kg/m.  Nursing Note and Vital Signs reviewed.  Physical Exam Constitutional: Patient appears well-developed and well-nourished. Obese, No distress.  HEENT: head atraumatic, normocephalic Cardiovascular: Normal rate, regular rhythm, S1/S2 present.  No murmur or rub heard. Trace BLE edema. Pulmonary/Chest: Effort normal and breath sounds clear. No respiratory distress or retractions. Psychiatric: Patient has a normal mood and affect. behavior is baseline. Judgment and thought content baseline. MSK: No bony abnormalities or tenderness. Baseline AROM of BUE and BLE. Pt is wheelchair-bound.  Recent Results (from the past 2160 hour(s))  Hemoglobin A1c     Status: None   Collection Time: 10/02/16  9:52 AM  Result Value Ref  Range   Hgb A1c MFr Bld 4.7 <5.7 %    Comment:   For the purpose of screening for the presence of diabetes:   <5.7%       Consistent with the absence of diabetes 5.7-6.4 %   Consistent with increased risk for diabetes (prediabetes) >=6.5 %     Consistent with diabetes   This assay result is consistent with a decreased risk of diabetes.   Currently, no consensus exists regarding use of hemoglobin A1c for diagnosis of diabetes in children.   According to American Diabetes Association (ADA) guidelines, hemoglobin A1c <7.0% represents optimal control in non-pregnant diabetic patients. Different metrics may apply to specific patient populations. Standards of Medical Care in Diabetes (ADA).      Mean Plasma Glucose 88 mg/dL  VITAMIN D 25 Hydroxy (Vit-D Deficiency, Fractures)     Status: None   Collection Time: 10/02/16  9:52 AM  Result Value Ref Range   Vit D, 25-Hydroxy 36 30 - 100 ng/mL    Comment: Vitamin D Status  25-OH Vitamin D        Deficiency                <20 ng/mL        Insufficiency         20 - 29 ng/mL        Optimal             > or = 30 ng/mL   For 25-OH Vitamin D testing on patients on D2-supplementation and patients for whom quantitation of D2 and D3 fractions is required, the QuestAssureD 25-OH VIT D, (D2,D3), LC/MS/MS is recommended: order code [phone] (patients > 2 yrs).   Urine Culture     Status: None   Collection Time: 10/03/16  8:56 AM  Result Value Ref Range   Organism ID, Bacteria NO GROWTH      Assessment & Plan  1. Elevated blood pressure reading - hydrochlorothiazide (HYDRODIURIL) 12.5 MG tablet; Take 1 tablet (12.5 mg total) by mouth daily.  Dispense: 30 tablet; Refill: 0 - Discussed starting new medication at length. Advised to keep patient hydrated, allow additional time for the restroom in the first few weeks of the medication, and to call if any behavioral changes, lightheadedness, increased weakness. - Advised that if BP is <110/70  during morning BP reading, to hold HCTZ that day.  Staff may continue his other medications as prescribed.  2. History of recent fall Continue in-home PT, caregivers will notify us if new/additional orders are needed. Fall precautions while in restroom discussed including having assistant with pt at all times.  -Red flags and when to present for emergency care or RTC including fever >101.23F, chest pain, shortness of breath, new/worsening/un-resolving symptoms, lightheadedness, weakness, behavioral changes reviewed with patient at time of visit. Follow up and care instructions discussed and provided in AVS.  Return in about 4 weeks (around 11/23/2016) for BP follow up with PCP Dr. Ancil Boozer.   I have reviewed this encounter including the documentation in this note and/or discussed this patient with the Johney Maine, FNP, NP-C. I am certifying that I agree with the content of this note as supervising physician.  Steele Sizer, MD [facility] 10/29/2016, 9:40 PM

## 2016-10-27 ENCOUNTER — Other Ambulatory Visit: Payer: Self-pay | Admitting: Family Medicine

## 2016-10-27 DIAGNOSIS — R350 Frequency of micturition: Secondary | ICD-10-CM

## 2016-10-27 NOTE — Telephone Encounter (Signed)
Patient requesting refill of Uroxatral to [facility].

## 2016-11-01 ENCOUNTER — Other Ambulatory Visit: Payer: Self-pay | Admitting: Family Medicine

## 2016-11-01 DIAGNOSIS — R350 Frequency of micturition: Secondary | ICD-10-CM

## 2016-11-02 ENCOUNTER — Telehealth: Payer: Self-pay | Admitting: Family Medicine

## 2016-11-02 NOTE — Telephone Encounter (Signed)
[name] from [name] is requesting an order for a bed side rail due to patient falling out of the bed.  [phone]

## 2016-11-03 NOTE — Telephone Encounter (Signed)
okay

## 2016-11-06 DIAGNOSIS — M6281 Muscle weakness (generalized): Secondary | ICD-10-CM | POA: Diagnosis not present

## 2016-11-06 DIAGNOSIS — R262 Difficulty in walking, not elsewhere classified: Secondary | ICD-10-CM | POA: Diagnosis not present

## 2016-11-06 NOTE — Telephone Encounter (Signed)
[name] is faxing it now

## 2016-11-06 NOTE — Telephone Encounter (Signed)
Please write rx so that I may fax it to [facility] at [phone]

## 2016-11-08 ENCOUNTER — Other Ambulatory Visit: Payer: Self-pay | Admitting: Family Medicine

## 2016-11-08 MED ORDER — VITAMIN C 500 MG PO TABS: 500.0000 mg | ORAL_TABLET | Freq: Two times a day (BID) | ORAL | 12 refills | Status: DC

## 2016-11-09 DIAGNOSIS — R339 Retention of urine, unspecified: Secondary | ICD-10-CM | POA: Diagnosis not present

## 2016-11-09 DIAGNOSIS — Z8546 Personal history of malignant neoplasm of prostate: Secondary | ICD-10-CM | POA: Diagnosis not present

## 2016-11-09 DIAGNOSIS — N3020 Other chronic cystitis without hematuria: Secondary | ICD-10-CM | POA: Diagnosis not present

## 2016-11-13 DIAGNOSIS — M6281 Muscle weakness (generalized): Secondary | ICD-10-CM | POA: Diagnosis not present

## 2016-11-13 DIAGNOSIS — R262 Difficulty in walking, not elsewhere classified: Secondary | ICD-10-CM | POA: Diagnosis not present

## 2016-11-15 ENCOUNTER — Other Ambulatory Visit: Payer: Self-pay | Admitting: Family Medicine

## 2016-11-15 DIAGNOSIS — R030 Elevated blood-pressure reading, without diagnosis of hypertension: Secondary | ICD-10-CM

## 2016-11-15 NOTE — Telephone Encounter (Signed)
Pt coming in for HTN check in 8 days. Could you please call [name] and find out if he has enough to last until this visit? If not, I'll send in an 8 day supply. I want to make sure he doesn't need a dosage change before we provide a full refill. Thanks!

## 2016-11-16 ENCOUNTER — Other Ambulatory Visit: Payer: Self-pay

## 2016-11-16 DIAGNOSIS — F320 Major depressive disorder, single episode, mild: Secondary | ICD-10-CM

## 2016-11-16 DIAGNOSIS — Z634 Disappearance and death of family member: Principal | ICD-10-CM

## 2016-11-16 DIAGNOSIS — R030 Elevated blood-pressure reading, without diagnosis of hypertension: Secondary | ICD-10-CM

## 2016-11-16 DIAGNOSIS — F4329 Adjustment disorder with other symptoms: Secondary | ICD-10-CM

## 2016-11-16 DIAGNOSIS — F4321 Adjustment disorder with depressed mood: Secondary | ICD-10-CM

## 2016-11-16 MED ORDER — SERTRALINE HCL 50 MG PO TABS: 50.0000 mg | ORAL_TABLET | Freq: Every day | ORAL | 5 refills | Status: DC

## 2016-11-16 MED ORDER — HYDROCHLOROTHIAZIDE 12.5 MG PO TABS: 12.5000 mg | ORAL_TABLET | Freq: Every day | ORAL | 0 refills | Status: DC

## 2016-11-16 NOTE — Telephone Encounter (Signed)
Patient requesting refill of Setraline to [facility].

## 2016-11-17 ENCOUNTER — Other Ambulatory Visit: Payer: Self-pay | Admitting: Family Medicine

## 2016-11-17 NOTE — Telephone Encounter (Signed)
Patient requesting refill of Cranberry to [facility].

## 2016-11-20 ENCOUNTER — Other Ambulatory Visit: Payer: Self-pay

## 2016-11-20 DIAGNOSIS — R262 Difficulty in walking, not elsewhere classified: Secondary | ICD-10-CM | POA: Diagnosis not present

## 2016-11-20 DIAGNOSIS — M6281 Muscle weakness (generalized): Secondary | ICD-10-CM | POA: Diagnosis not present

## 2016-11-20 MED ORDER — VITAMIN C 500 MG PO TABS: 500.0000 mg | ORAL_TABLET | Freq: Two times a day (BID) | ORAL | 12 refills | Status: DC

## 2016-11-20 NOTE — Telephone Encounter (Signed)
Patient requesting refill of Vitamin C to [facility].

## 2016-11-24 ENCOUNTER — Ambulatory Visit (INDEPENDENT_AMBULATORY_CARE_PROVIDER_SITE_OTHER): Payer: Medicare Other | Admitting: Family Medicine

## 2016-11-24 ENCOUNTER — Encounter: Payer: Self-pay | Admitting: Family Medicine

## 2016-11-24 VITALS — BP 132/78 | HR 73 | Temp 98.4°F | Resp 16 | Ht 64.0 in | Wt 163.0 lb

## 2016-11-24 DIAGNOSIS — I10 Essential (primary) hypertension: Secondary | ICD-10-CM

## 2016-11-24 DIAGNOSIS — F320 Major depressive disorder, single episode, mild: Secondary | ICD-10-CM | POA: Diagnosis not present

## 2016-11-24 DIAGNOSIS — R600 Localized edema: Secondary | ICD-10-CM

## 2016-11-24 MED ORDER — HYDROCHLOROTHIAZIDE 12.5 MG PO TABS: 12.5000 mg | ORAL_TABLET | Freq: Every day | ORAL | 5 refills | Status: DC

## 2016-11-24 NOTE — Progress Notes (Signed)
Name: [name]   MRN: [id number]    DOB: 1940-09-30   Date:11/24/2016       Progress Note  Subjective  Chief Complaint  Chief Complaint  Patient presents with  . Follow-up    elevated blood pressure B/P had been recorded daily in a log    HPI  HTN: his bp started to go up around June 2018, unknown cause. He seemed to be a little more aggravated with his sister [name]) but he was not agitated at the time. We tried alprazolam for a period of time, but continues to go up, and we added HCTZ, bp at home has been well controlled since. He denies chest pain, palpitation or SOB.   Mild Depression: he is doing well on Zoloft, no recent crying spells, he can get angry when aggravated, but Zoloft seems to have helped. Caregiver Best boy) states dose seems to be good for him.   Lower extremity edema: wearing a pump twice daily and compression stocking hoses and is doing well, swelling is down, mostly on ankles now, legs are not sore.    Patient Active Problem List   Diagnosis Date Noted  . Elevated blood pressure reading 10/26/2016  . Late effect acute polio 05/17/2016  . Chronic constipation 11/19/2015  . Mild major depression ([place]) 11/19/2015  . Complicated grieving 46/56/8127  . Recurrent UTI 09/11/2015  . Adenocarcinoma of prostate ([place]) 03/02/2015  . Amyotrophia 03/02/2015  . Edema leg 03/02/2015  . Cerebral palsy ([place]) 03/02/2015  . Dyslipidemia 03/02/2015  . Dermatitis, eczematoid 03/02/2015  . 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 H/O acute poliomyelitis 03/02/2015  . Lymphedema 03/02/2015  . Mental retardation 03/02/2015  . Hypertrophy of nail 03/02/2015  . Adiposity 03/02/2015  . Absence of bladder continence 03/02/2015  . Chronic venous insufficiency 03/02/2015  . Avitaminosis D 03/02/2015  . Adynamia 03/02/2015  . Dependent on wheelchair 03/02/2015  . Kidney cysts 03/02/2015  . At risk for falling 03/02/2015  . Cerebral vascular disease 03/02/2015  . Strabismus  02/11/2015  . Urge incontinence of urine 02/11/2015  . Wheelchair dependence 02/11/2015  . Obesity 02/11/2015  . Urinary frequency 10/30/2014    Past Surgical History:  Procedure Laterality Date  . leg circulation surgery Right   . PROSTATE SURGERY  11/10/2008   BRACHYTHERAPY    Family History  Problem Relation Age of Onset  . Heart attack Brother   . Heart disease Unknown     Social History   Social History  . Marital status: Single    Spouse name: N/A  . Number of children: N/A  . Years of education: N/A   Occupational History  . Not on file.   Social History Main Topics  . Smoking status: Never Smoker  . Smokeless tobacco: Never Used  . Alcohol use No  . Drug use: No  . Sexual activity: No   Other Topics Concern  . Not on file   Social History Narrative   Lives at [facility] group home, 5 males.          Current Outpatient Prescriptions:  .  alfuzosin (UROXATRAL) 10 MG 24 hr tablet, TAKE 1 TABLET (10MG ) BY MOUTH ONCE DAILY WITH BREAKFAST*DO NOT CRUSH*, Disp: 30 tablet, Rfl: 11 .  ALPRAZolam (XANAX) 0.25 MG tablet, TAKE ONE TABLET BY MOUTH 2 TIMES A DAY AS NEEDED FOR ANXIETY (WHEN BLOOD PRESSURE IS VERY HIGH OR ANXIOUS), Disp: 20 tablet, Rfl: 0 .  aspirin 81 MG tablet, 1 tablet daily, Disp:  30 tablet, Rfl: 0 .  betamethasone dipropionate (DIPROLENE) 0.05 % cream, Apply topically as needed., Disp: 45 g, Rfl: 1 .  Calcium Carb-Cholecalciferol 500-400 MG-UNIT TABS, TAKE ONE TABLET BY MOUTH 2 TIMES A DAY, Disp: 60 tablet, Rfl: 12 .  cloNIDine (CATAPRES) 0.1 MG tablet, Take 1 tablet (0.1 mg total) by mouth 3 (three) times daily. (Patient taking differently: Take 0.1 mg by mouth as needed. ), Disp: 30 tablet, Rfl: 0 .  Cranberry 425 MG CAPS, Take 1 capsule by mouth daily., Disp: 30 capsule, Rfl: PRN .  Elastic Bandages & Supports (MEDICAL COMPRESSION STOCKINGS) MISC, 1 Units by Does not apply route daily., Disp: 2 each, Rfl: 0 .  hydrochlorothiazide (HYDRODIURIL)  12.5 MG tablet, Take 1 tablet (12.5 mg total) by mouth daily., Disp: 30 tablet, Rfl: 0 .  ibuprofen (ADVIL,MOTRIN) 800 MG tablet, Take 1 tablet (800 mg total) by mouth every 8 (eight) hours as needed., Disp: 30 tablet, Rfl: 0 .  Incontinence Supply Disposable (DEPEND SILHOUETTE BRIEFS L/XL) MISC, 1 each by Does not apply route 4 (four) times daily., Disp: 120 each, Rfl: 5 .  polyethylene glycol powder (GLYCOLAX/MIRALAX) powder, Take 17 g by mouth daily as needed., Disp: 3350 g, Rfl: 5 .  Polyvinyl Alcohol-Povidone 5-6 MG/ML SOLN, Apply 2 drops to eye 2 (two) times daily as needed., Disp: 1 Bottle, Rfl: 2 .  sertraline (ZOLOFT) 50 MG tablet, Take 1 tablet (50 mg total) by mouth daily., Disp: 30 tablet, Rfl: 5 .  vitamin C (ASCORBIC ACID) 500 MG tablet, Take 1 tablet (500 mg total) by mouth 2 (two) times daily., Disp: 60 tablet, Rfl: 12  No Known Allergies   ROS  Constitutional: Negative for fever or weight change.  Respiratory: Negative for cough and shortness of breath.   Cardiovascular: Negative for chest pain or palpitations.  Gastrointestinal: Negative for abdominal pain, no bowel changes.  Musculoskeletal: Positive for gait problem no  joint swelling.  Skin: Negative for rash.  Neurological: Negative for dizziness or headache.  No other specific complaints in a complete review of systems (except as listed in HPI above).  Objective  Vitals:   11/24/16 0944  BP: 132/78  Pulse: 73  Resp: 16  Temp: 98.4 F (36.9 C)  SpO2: 97%  Weight: 163 lb (73.9 kg)  Height: 5\' 4"  (1.626 m)    Body mass index is 27.98 kg/m.  Physical Exam  Constitutional: Obese No distress.  HEENT: head atraumatic, normocephalic, strabismus, neck supple, throat within normal limits Cardiovascular: Normal rate, regular rhythm and normal heart sounds. No murmur heard.1 plus bilateral ankle edema doing better on compression stocking hoses Pulmonary/Chest: Effort normal and breath sounds normal. No  respiratory distress. Abdominal: Soft. There is no tenderness. Negative CVA tenderness Psychiatric: Patient has a normal mood and affect. behavior is normal. Judgment and thought content normal. Muscular Skeletal: wheelchair bound, atrophic legs   Recent Results (from the past 2160 hour(s))  Hemoglobin A1c     Status: None   Collection Time: 10/02/16  9:52 AM  Result Value Ref Range   Hgb A1c MFr Bld 4.7 <5.7 %    Comment:   For the purpose of screening for the presence of diabetes:   <5.7%       Consistent with the absence of diabetes 5.7-6.4 %   Consistent with increased risk for diabetes (prediabetes) >=6.5 %     Consistent with diabetes   This assay result is consistent with a decreased risk of diabetes.   Currently,  no consensus exists regarding use of hemoglobin A1c for diagnosis of diabetes in children.   According to American Diabetes Association (ADA) guidelines, hemoglobin A1c <7.0% represents optimal control in non-pregnant diabetic patients. Different metrics may apply to specific patient populations. Standards of Medical Care in Diabetes (ADA).      Mean Plasma Glucose 88 mg/dL  VITAMIN D 25 Hydroxy (Vit-D Deficiency, Fractures)     Status: None   Collection Time: 10/02/16  9:52 AM  Result Value Ref Range   Vit D, 25-Hydroxy 36 30 - 100 ng/mL    Comment: Vitamin D Status           25-OH Vitamin D        Deficiency                <20 ng/mL        Insufficiency         20 - 29 ng/mL        Optimal             > or = 30 ng/mL   For 25-OH Vitamin D testing on patients on D2-supplementation and patients for whom quantitation of D2 and D3 fractions is required, the QuestAssureD 25-OH VIT D, (D2,D3), LC/MS/MS is recommended: order code [id number] (patients > 2 yrs).   Urine Culture     Status: None   Collection Time: 10/03/16  8:56 AM  Result Value Ref Range   Organism ID, Bacteria NO GROWTH       PHQ2/9: Depression screen [facility] 2/9 02/14/2016 12/23/2015 11/19/2015  08/19/2015 07/19/2015  Decreased Interest 0 0 0 0 0  Down, Depressed, Hopeless 0 0 0 1 0  PHQ - 2 Score 0 0 0 1 0     Fall Risk: Fall Risk  02/14/2016 12/23/2015 11/19/2015 08/19/2015 07/19/2015  Falls in the past year? Yes Yes Yes No No  Number falls in past yr: 2 or more 2 or more 1 - -  Injury with Fall? Yes Yes No - -  Risk Factor Category  High Fall Risk High Fall Risk - - -  Risk for fall due to : - History of fall(s);Impaired balance/gait;Impaired mobility - - -    Assessment & Plan  1. Hypertension, benign  Doing well on HCTZ bp is at goal  - CBC with Differential/Platelet - COMPLETE METABOLIC PANEL WITH GFR - Lipid panel - TSH - Urine Microalbumin w/creat. ratio - hydrochlorothiazide (HYDRODIURIL) 12.5 MG tablet; Take 1 tablet (12.5 mg total) by mouth daily.  Dispense: 30 tablet; Refill: 5  2. Mild major depression ([place])  He is now on Zoloft 50 mg, doing better, caregivers are making sure that he has little contact with sister [name] ) that aggravates him, his nephew [name]) is the guardian.   3. Edema leg  Continue current regiment, doing well with pump and and hoses.

## 2016-11-27 DIAGNOSIS — R262 Difficulty in walking, not elsewhere classified: Secondary | ICD-10-CM | POA: Diagnosis not present

## 2016-11-27 DIAGNOSIS — I10 Essential (primary) hypertension: Secondary | ICD-10-CM | POA: Diagnosis not present

## 2016-11-27 DIAGNOSIS — M6281 Muscle weakness (generalized): Secondary | ICD-10-CM | POA: Diagnosis not present

## 2016-11-27 LAB — COMPLETE METABOLIC PANEL WITH GFR
ALBUMIN: 4 g/dL (ref 3.6–5.1)
ALK PHOS: 140 U/L — AB (ref 40–115)
ALT: 15 U/L (ref 9–46)
AST: 17 U/L (ref 10–35)
BUN: 15 mg/dL (ref 7–25)
CALCIUM: 9.8 mg/dL (ref 8.6–10.3)
CO2: 26 mmol/L (ref 20–32)
Chloride: 100 mmol/L (ref 98–110)
Creat: 0.72 mg/dL (ref 0.70–1.18)
GFR, Est Non African American: >89 mL/min (ref >=60)
Glucose, Bld: 87 mg/dL (ref 65–99)
Potassium: 4 mmol/L (ref 3.5–5.3)
Sodium: 141 mmol/L (ref 135–146)
TOTAL PROTEIN: 6.9 g/dL (ref 6.1–8.1)
Total Bilirubin: 0.6 mg/dL (ref 0.2–1.2)

## 2016-11-27 LAB — CBC WITH DIFFERENTIAL/PLATELET
BASOS PCT: 0 %
Basophils Absolute: 0 cells/uL (ref 0–200)
EOS ABS: 48 {cells}/uL (ref 15–500)
Eosinophils Relative: 1 %
HEMATOCRIT: 43.7 % (ref 38.5–50.0)
HEMOGLOBIN: 15.2 g/dL (ref 13.2–17.1)
LYMPHS PCT: 28 %
Lymphs Abs: 1344 cells/uL (ref 850–3900)
MCH: 33 pg (ref 27.0–33.0)
MCHC: 34.8 g/dL (ref 32.0–36.0)
MCV: 94.8 fL (ref 80.0–100.0)
MONO ABS: 336 {cells}/uL (ref 200–950)
MPV: 9.8 fL (ref 7.5–12.5)
Monocytes Relative: 7 %
NEUTROS ABS: 3072 {cells}/uL (ref 1500–7800)
Neutrophils Relative %: 64 %
Platelets: 165 10*3/uL (ref 140–400)
RBC: 4.61 MIL/uL (ref 4.20–5.80)
RDW: 13.3 % (ref 11.0–15.0)
WBC: 4.8 10*3/uL (ref 3.8–10.8)

## 2016-11-27 LAB — LIPID PANEL
CHOL/HDL RATIO: 2.7 ratio (ref <5.0)
Cholesterol: 124 mg/dL (ref <200)
HDL: 46 mg/dL (ref >40)
LDL Cholesterol: 62 mg/dL (ref <100)
Triglycerides: 81 mg/dL (ref <150)
VLDL: 16 mg/dL (ref <30)

## 2016-11-27 LAB — TSH: TSH: 2.42 m[IU]/L (ref 0.40–4.50)

## 2016-11-28 LAB — MICROALBUMIN / CREATININE URINE RATIO
CREATININE, URINE: 77 mg/dL (ref 20–370)
MICROALB UR: 0.4 mg/dL
MICROALB/CREAT RATIO: 5 ug/mg{creat} (ref <30)

## 2016-12-04 DIAGNOSIS — M6281 Muscle weakness (generalized): Secondary | ICD-10-CM | POA: Diagnosis not present

## 2016-12-04 DIAGNOSIS — R262 Difficulty in walking, not elsewhere classified: Secondary | ICD-10-CM | POA: Diagnosis not present

## 2016-12-11 DIAGNOSIS — M6281 Muscle weakness (generalized): Secondary | ICD-10-CM | POA: Diagnosis not present

## 2016-12-11 DIAGNOSIS — R262 Difficulty in walking, not elsewhere classified: Secondary | ICD-10-CM | POA: Diagnosis not present

## 2016-12-18 DIAGNOSIS — R262 Difficulty in walking, not elsewhere classified: Secondary | ICD-10-CM | POA: Diagnosis not present

## 2016-12-18 DIAGNOSIS — M6281 Muscle weakness (generalized): Secondary | ICD-10-CM | POA: Diagnosis not present

## 2017-01-01 DIAGNOSIS — M6281 Muscle weakness (generalized): Secondary | ICD-10-CM | POA: Diagnosis not present

## 2017-01-01 DIAGNOSIS — R262 Difficulty in walking, not elsewhere classified: Secondary | ICD-10-CM | POA: Diagnosis not present

## 2017-01-08 DIAGNOSIS — R262 Difficulty in walking, not elsewhere classified: Secondary | ICD-10-CM | POA: Diagnosis not present

## 2017-01-08 DIAGNOSIS — M6281 Muscle weakness (generalized): Secondary | ICD-10-CM | POA: Diagnosis not present

## 2017-01-15 ENCOUNTER — Telehealth: Payer: Self-pay | Admitting: Family Medicine

## 2017-01-15 DIAGNOSIS — M6281 Muscle weakness (generalized): Secondary | ICD-10-CM | POA: Diagnosis not present

## 2017-01-15 DIAGNOSIS — R262 Difficulty in walking, not elsewhere classified: Secondary | ICD-10-CM | POA: Diagnosis not present

## 2017-01-15 NOTE — Telephone Encounter (Signed)
[name] with [name] states that they are needing a RX for an elevated commode for patient [name].  Please call once RX is ready.  Jessica's call back # is [phone].

## 2017-01-15 NOTE — Telephone Encounter (Signed)
Please fill out and I will sign it, Thank you

## 2017-01-17 NOTE — Telephone Encounter (Signed)
[name] notified and script is upfront ready for her to pick up.

## 2017-01-22 DIAGNOSIS — M6281 Muscle weakness (generalized): Secondary | ICD-10-CM | POA: Diagnosis not present

## 2017-01-22 DIAGNOSIS — R262 Difficulty in walking, not elsewhere classified: Secondary | ICD-10-CM | POA: Diagnosis not present

## 2017-01-29 DIAGNOSIS — R262 Difficulty in walking, not elsewhere classified: Secondary | ICD-10-CM | POA: Diagnosis not present

## 2017-01-29 DIAGNOSIS — M6281 Muscle weakness (generalized): Secondary | ICD-10-CM | POA: Diagnosis not present

## 2017-01-31 ENCOUNTER — Ambulatory Visit: Payer: Medicare Other | Admitting: Family Medicine

## 2017-02-01 ENCOUNTER — Ambulatory Visit: Payer: Medicare Other | Admitting: Family Medicine

## 2017-02-05 DIAGNOSIS — R262 Difficulty in walking, not elsewhere classified: Secondary | ICD-10-CM | POA: Diagnosis not present

## 2017-02-05 DIAGNOSIS — M6281 Muscle weakness (generalized): Secondary | ICD-10-CM | POA: Diagnosis not present

## 2017-02-14 ENCOUNTER — Ambulatory Visit (INDEPENDENT_AMBULATORY_CARE_PROVIDER_SITE_OTHER): Payer: Medicare Other | Admitting: Family Medicine

## 2017-02-14 ENCOUNTER — Encounter: Payer: Self-pay | Admitting: Family Medicine

## 2017-02-14 ENCOUNTER — Ambulatory Visit: Payer: Medicare Other

## 2017-02-14 VITALS — BP 122/76 | HR 71 | Temp 98.3°F | Resp 16 | Ht 64.0 in | Wt 163.0 lb

## 2017-02-14 DIAGNOSIS — Z23 Encounter for immunization: Secondary | ICD-10-CM

## 2017-02-14 DIAGNOSIS — C61 Malignant neoplasm of prostate: Secondary | ICD-10-CM

## 2017-02-14 DIAGNOSIS — G804 Ataxic cerebral palsy: Secondary | ICD-10-CM | POA: Diagnosis not present

## 2017-02-14 DIAGNOSIS — Z0000 Encounter for general adult medical examination without abnormal findings: Secondary | ICD-10-CM

## 2017-02-14 NOTE — Patient Instructions (Signed)
Preventive Care 74 Years and Older, Male Preventive care refers to lifestyle choices and visits with your health care provider that can promote health and wellness. What does preventive care include?  A yearly physical exam. This is also called an annual well check.  Dental exams once or twice a year.  Routine eye exams. Ask your health care provider how often you should have your eyes checked.  Personal lifestyle choices, including: ? Daily care of your teeth and gums. ? Regular physical activity. ? Eating a healthy diet. ? Avoiding tobacco and drug use. ? Limiting alcohol use. ? Practicing safe sex. ? Taking low doses of aspirin every day. ? Taking vitamin and mineral supplements as recommended by your health care provider. What happens during an annual well check? The services and screenings done by your health care provider during your annual well check will depend on your age, overall health, lifestyle risk factors, and family history of disease. Counseling Your health care provider may ask you questions about your:  Alcohol use.  Tobacco use.  Drug use.  Emotional well-being.  Home and relationship well-being.  Sexual activity.  Eating habits.  History of falls.  Memory and ability to understand (cognition).  Work and work environment.  Screening You may have the following tests or measurements:  Height, weight, and BMI.  Blood pressure.  Lipid and cholesterol levels. These may be checked every 5 years, or more frequently if you are over 50 years old.  Skin check.  Lung cancer screening. You may have this screening every year starting at age 55 if you have a 30-pack-year history of smoking and currently smoke or have quit within the past 15 years.  Fecal occult blood test (FOBT) of the stool. You may have this test every year starting at age 50.  Flexible sigmoidoscopy or colonoscopy. You may have a sigmoidoscopy every 5 years or a colonoscopy every 10  years starting at age 50.  Prostate cancer screening. Recommendations will vary depending on your family history and other risks.  Hepatitis C blood test.  Hepatitis B blood test.  Sexually transmitted disease (STD) testing.  Diabetes screening. This is done by checking your blood sugar (glucose) after you have not eaten for a while (fasting). You may have this done every 1-3 years.  Abdominal aortic aneurysm (AAA) screening. You may need this if you are a current or former smoker.  Osteoporosis. You may be screened starting at age 70 if you are at high risk.  Talk with your health care provider about your test results, treatment options, and if necessary, the need for more tests. Vaccines Your health care provider may recommend certain vaccines, such as:  Influenza vaccine. This is recommended every year.  Tetanus, diphtheria, and acellular pertussis (Tdap, Td) vaccine. You may need a Td booster every 10 years.  Varicella vaccine. You may need this if you have not been vaccinated.  Zoster vaccine. You may need this after age 60.  Measles, mumps, and rubella (MMR) vaccine. You may need at least one dose of MMR if you were born in 1957 or later. You may also need a second dose.  Pneumococcal 13-valent conjugate (PCV13) vaccine. One dose is recommended after age 65.  Pneumococcal polysaccharide (PPSV23) vaccine. One dose is recommended after age 65.  Meningococcal vaccine. You may need this if you have certain conditions.  Hepatitis A vaccine. You may need this if you have certain conditions or if you travel or work in places where you   may be exposed to hepatitis A.  Hepatitis B vaccine. You may need this if you have certain conditions or if you travel or work in places where you may be exposed to hepatitis B.  Haemophilus influenzae type b (Hib) vaccine. You may need this if you have certain risk factors.  Talk to your health care provider about which screenings and vaccines  you need and how often you need them. This information is not intended to replace advice given to you by your health care provider. Make sure you discuss any questions you have with your health care provider. Document Released: 05/07/2015 Document Revised: 12/29/2015 Document Reviewed: 02/09/2015 Elsevier Interactive Patient Education  2017 [employer].

## 2017-02-14 NOTE — Progress Notes (Signed)
Patient: [name], Male    DOB: 06/30/1940, 76 y.o.   MRN: [id number]  Visit Date: 02/14/2017  Today's Provider: Loistine Chance, MD   Chief Complaint  Patient presents with  . FL-2 Form  . Cerebral Palsy    Subjective:   [name] is a 76 y.o. male who presents today for his Subsequent Annual Wellness Visit.  Patient/Caregiver input:  Weakness, needs to have PT again  HPI   CP/wheelchair bound: he can transfer from bed to chair, but he has been weakness since stopped PT ( not longer covered by previous insurance), caregiver would like to have it resumed, he has a history of recurrent falls. No contractures, but has ataxic gait.  He is here today with [name])   Review of Systems Constitutional: Negative for fever or weight change.  Respiratory: Negative for cough and shortness of breath.   Cardiovascular: Negative for chest pain or palpitations.  Gastrointestinal: Negative for abdominal pain, no bowel changes.  Musculoskeletal: Positive for gait problem or joint swelling.  Skin: Negative for rash.  Neurological: Negative for dizziness or headache.  No other specific complaints in a complete review of systems (except as listed in HPI above).  Past Medical History:  Diagnosis Date  . Atrophy, disuse, muscle   . BPH (benign prostatic hyperplasia)   . Cerebral palsy ([place])   . Eczema   . Elevated PSA   . Exotropia   . Hematuria, gross   . Hyperlipidemia   . Incontinence   . Lower urinary tract symptoms   . Lymphedema   . Mental retardation   . Over weight   . Prostate cancer ([place])   . Renal cyst   . Urinary frequency   . Urinary urgency   . Venous insufficiency   . Vitamin D deficiency   . Wheelchair dependence     Past Surgical History:  Procedure Laterality Date  . leg circulation surgery Right   . PROSTATE SURGERY  11/10/2008   BRACHYTHERAPY    Family History  Problem Relation Age of Onset  . Heart attack Brother   . Heart  disease Unknown     Social History   Social History  . Marital status: Single    Spouse name: N/A  . Number of children: N/A  . Years of education: N/A   Occupational History  . Not on file.   Social History Main Topics  . Smoking status: Never Smoker  . Smokeless tobacco: Never Used  . Alcohol use No  . Drug use: No  . Sexual activity: No   Other Topics Concern  . Not on file   Social History Narrative   Lives at [facility] group home, 5 males.         Outpatient Encounter Prescriptions as of 02/14/2017  Medication Sig Note  . alfuzosin (UROXATRAL) 10 MG 24 hr tablet TAKE 1 TABLET (10MG) BY MOUTH ONCE DAILY WITH BREAKFAST*DO NOT CRUSH*   . ALPRAZolam (XANAX) 0.25 MG tablet TAKE ONE TABLET BY MOUTH 2 TIMES A DAY AS NEEDED FOR ANXIETY (WHEN BLOOD PRESSURE IS VERY HIGH OR ANXIOUS)   . aspirin 81 MG tablet 1 tablet daily   . betamethasone dipropionate (DIPROLENE) 0.05 % cream Apply topically as needed.   . Calcium Carb-Cholecalciferol 500-400 MG-UNIT TABS TAKE ONE TABLET BY MOUTH 2 TIMES A DAY   . carboxymethylcellul-glycerin (REFRESH OPTIVE) 0.5-0.9 % ophthalmic solution Place 2 drops into both eyes 2 (two) times daily as needed  for dry eyes.   . cloNIDine (CATAPRES) 0.1 MG tablet Take 1 tablet (0.1 mg total) by mouth 3 (three) times daily. (Patient taking differently: Take 0.1 mg by mouth as needed. ) 09/08/2015: PRN  . Cranberry 425 MG CAPS Take 1 capsule by mouth daily.   Regino Schultze Bandages & Supports (MEDICAL COMPRESSION STOCKINGS) MISC 1 Units by Does not apply route daily.   . hydrochlorothiazide (HYDRODIURIL) 12.5 MG tablet Take 1 tablet (12.5 mg total) by mouth daily.   Marland Kitchen ibuprofen (ADVIL,MOTRIN) 800 MG tablet Take 1 tablet (800 mg total) by mouth every 8 (eight) hours as needed.   . Incontinence Supply Disposable (DEPEND SILHOUETTE BRIEFS L/XL) MISC 1 each by Does not apply route 4 (four) times daily.   Vladimir Faster Glycol-Propyl Glycol (SYSTANE ULTRA) 0.4-0.3 % SOLN  Apply 1 drop to eye 2 (two) times daily. Both eyes   . polyethylene glycol powder (GLYCOLAX/MIRALAX) powder Take 17 g by mouth daily as needed.   . Polyvinyl Alcohol-Povidone 5-6 MG/ML SOLN Apply 2 drops to eye 2 (two) times daily as needed.   . sertraline (ZOLOFT) 50 MG tablet Take 1 tablet (50 mg total) by mouth daily.   . vitamin C (ASCORBIC ACID) 500 MG tablet Take 1 tablet (500 mg total) by mouth 2 (two) times daily.    No facility-administered encounter medications on file as of 02/14/2017.     No Known Allergies  Care Team Updated in EHR: Yes  Last Vision Exam: 12/26/2016 Wears corrective lenses: Yes Last Dental Exam: 2018 Last Hearing Exam:  Wears Hearing Aids: No  Functional Ability / Safety Screening 1.  Was the timed Get Up and Go test longer than 30 seconds?  no 2.  Does the patient need help with the phone, transportation, shopping,      preparing meals, housework, laundry, medications, or managing money?  yes 3.  Does the patient's home have:  loose throw rugs in the hallway?   no      Grab bars in the bathroom? yes      Handrails on the stairs?   yes      Poor lighting?   no 4.  Has the patient noticed any hearing difficulties?   no  Diet Recall and Exercise Regimen:  On wheelchair, not active Eats the food given by group home  Fall Risk: positive, refer to home PT See screening under Objective Information  Depression Screen: normal See screening under Objective Information  Advanced Directives: Caregiver is not sure, lives in a group home Does patient have a HCPOA?    unknown , his guardian is Alberto Pina, his nephew If yes, name and contact information:  Does patient have a living will or MOST form?  no   Cancer Screenings:  Lung: Low Dose CT Chest recommended if Age 19-80 years, 30 pack-year currently smoking OR have quit w/in 15years. Patient does not qualify.  Lifestyle risk factor issued reviewed: Diet, exercise, weight management, advised patient  smoking is not healthy, nutrition/diet.   Prostate: sees Dr. Jacqlyn Larsen Colon: N/A  Additional Screenings:  Hepatitis C Screening:  Intimate Partner Violence: N/A in a group home AAA Screen: Men age 40 to 17 years if ever smoked recommended to get a one time AAA ultrasound screening exam. Patient does not qualify.  Objective:   Vitals: BP 122/76 (BP Location: Left Arm, Patient Position: Sitting, Cuff Size: Large)   Pulse 71   Temp 98.3 F (36.8 C) (Oral)   Resp 16  Ht 5' 4"  (1.626 m)   Wt 163 lb (73.9 kg)   SpO2 98%   BMI 27.98 kg/m  Body mass index is 27.98 kg/m.  Lab Results  Component Value Date   CHOL 124 11/27/2016   CHOL 130 06/23/2013   Lab Results  Component Value Date   HDL 46 11/27/2016   HDL 50 06/23/2013   Lab Results  Component Value Date   LDLCALC 62 11/27/2016   LDLCALC 66 06/23/2013   Lab Results  Component Value Date   TRIG 81 11/27/2016   TRIG 72 06/23/2013   Lab Results  Component Value Date   CHOLHDL 2.7 11/27/2016   No results found for: LDLDIRECT  No exam data present  Physical Exam Constitutional: Patient appears well-developed and well-nourished. Obese  No distress.  HEENT: head atraumatic, normocephalic, pupils equal and reactive to light,neck supple, throat within normal limits Cardiovascular: Normal rate, regular rhythm and normal heart sounds.  No murmur heard. Trace  BLE edema. Pulmonary/Chest: Effort normal and breath sounds normal. No respiratory distress. Abdominal: Soft.  There is no tenderness. Psychiatric: Patient has a normal mood and affect. behavior is normal.    Cognitive Testing - 6-CIT  Correct? Score   What year is it? no 4 Yes = 0    No = 4  What month is it? yes 0 Yes = 0    No = 3  Remember:     Pia Mau, [address], [place]     What time is it? no 3 Yes = 0    No = 3  Count backwards from 20 to 1 no 4 Correct = 0    1 error = 2   More than 1 error = 4  Say the months of the year in reverse. no 4  Correct = 0    1 error = 2   More than 1 error = 4  What address did I ask you to remember? no 6 Correct = 0  1 error = 2    2 error = 4    3 error = 6    4 error = 8    All wrong = 10       TOTAL SCORE  21/28   Interpretation:  Abnormal- he has MR  Normal (0-7) Abnormal (8-28)   Fall Risk: Fall Risk  02/14/2017 02/14/2016 12/23/2015 11/19/2015 08/19/2015  Falls in the past year? Yes Yes Yes Yes No  Number falls in past yr: 2 or more 2 or more 2 or more 1 -  Injury with Fall? No Yes Yes No -  Risk Factor Category  - High Fall Risk High Fall Risk - -  Risk for fall due to : Impaired balance/gait;Impaired mobility;History of fall(s) - History of fall(s);Impaired balance/gait;Impaired mobility - -    Depression Screen Depression screen [facility] 2/9 02/14/2017 02/14/2016 12/23/2015 11/19/2015 08/19/2015  Decreased Interest 0 0 0 0 0  Down, Depressed, Hopeless 0 0 0 0 1  PHQ - 2 Score 0 0 0 0 1    Recent Results (from the past 2160 hour(s))  CBC with Differential/Platelet     Status: None   Collection Time: 11/27/16  9:00 AM  Result Value Ref Range   WBC 4.8 3.8 - 10.8 K/uL   RBC 4.61 4.20 - 5.80 MIL/uL   Hemoglobin 15.2 13.2 - 17.1 g/dL   HCT 43.7 38.5 - 50.0 %   MCV 94.8 80.0 - 100.0 fL   MCH  33.0 27.0 - 33.0 pg   MCHC 34.8 32.0 - 36.0 g/dL   RDW 13.3 11.0 - 15.0 %   Platelets 165 140 - 400 K/uL   MPV 9.8 7.5 - 12.5 fL   Neutro Abs 3,072 1,500 - 7,800 cells/uL   Lymphs Abs 1,344 850 - 3,900 cells/uL   Monocytes Absolute 336 200 - 950 cells/uL   Eosinophils Absolute 48 15 - 500 cells/uL   Basophils Absolute 0 0 - 200 cells/uL   Neutrophils Relative % 64 %   Lymphocytes Relative 28 %   Monocytes Relative 7 %   Eosinophils Relative 1 %   Basophils Relative 0 %   Smear Review Criteria for review not met   COMPLETE METABOLIC PANEL WITH GFR     Status: Abnormal   Collection Time: 11/27/16  9:00 AM  Result Value Ref Range   Sodium 141 135 - 146 mmol/L   Potassium 4.0 3.5 - 5.3 mmol/L    Chloride 100 98 - 110 mmol/L   CO2 26 20 - 32 mmol/L    Comment: ** Please note change in reference range(s). **      Glucose, Bld 87 65 - 99 mg/dL   BUN 15 7 - 25 mg/dL   Creat 0.72 0.70 - 1.18 mg/dL    Comment:   For patients > or = 74 years of age: The upper reference limit for Creatinine is approximately 13% higher for people identified as African-American.      Total Bilirubin 0.6 0.2 - 1.2 mg/dL   Alkaline Phosphatase 140 (H) 40 - 115 U/L   AST 17 10 - 35 U/L   ALT 15 9 - 46 U/L   Total Protein 6.9 6.1 - 8.1 g/dL   Albumin 4.0 3.6 - 5.1 g/dL   Calcium 9.8 8.6 - 10.3 mg/dL   GFR, Est African American >89 >=60 mL/min   GFR, Est Non African American >89 >=60 mL/min  Lipid panel     Status: None   Collection Time: 11/27/16  9:00 AM  Result Value Ref Range   Cholesterol 124 <200 mg/dL   Triglycerides 81 <150 mg/dL   HDL 46 >40 mg/dL   Total CHOL/HDL Ratio 2.7 <5.0 Ratio   VLDL 16 <30 mg/dL   LDL Cholesterol 62 <100 mg/dL  TSH     Status: None   Collection Time: 11/27/16  9:00 AM  Result Value Ref Range   TSH 2.42 0.40 - 4.50 mIU/L  Urine Microalbumin w/creat. ratio     Status: None   Collection Time: 11/27/16  9:00 AM  Result Value Ref Range   Creatinine, Urine 77 20 - 370 mg/dL   Microalb, Ur 0.4 Not estab mg/dL   Microalb Creat Ratio 5 <30 mcg/mg creat    Comment: The ADA has defined abnormalities in albumin excretion as follows:           Category           Result                            (mcg/mg creatinine)                 Normal:    <30       Microalbuminuria:    30 - 299   Clinical albuminuria:    > or = 300   The ADA recommends that at least two of three specimens collected within a 3 -  6 month period be abnormal before considering a patient to be within a diagnostic category.        Assessment & Plan:     1. Medicare annual wellness visit, subsequent  Discussed importance of 150 minutes of physical activity weekly, eat two servings of fish  weekly, eat one serving of tree nuts ( cashews, pistachios, pecans, almonds.Marland Kitchen) every other day, eat 6 servings of fruit/vegetables daily and drink plenty of water and avoid sweet beverages.   2. Need for immunization against influenza  - Flu vaccine HIGH DOSE PF (Fluzone High dose)  3. Prostate cancer [facility])  Keep follow up with Dr. Jacqlyn Larsen, July 2018, incontinence is slightly better but still has to wear depends.   4. 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   Immunization History  Administered Date(s) Administered  . Influenza, High Dose Seasonal PF 02/11/2015, 02/14/2016, 02/14/2017  . Influenza,inj,Quad PF,6+ Mos 02/04/2013  . Influenza-Unspecified 12/31/2013, 02/11/2015  . Pneumococcal Conjugate-13 06/22/2014, 06/22/2014  . Pneumococcal Polysaccharide-23 09/15/2009, 09/15/2009  . Tdap 09/15/2009, 09/15/2009    Health Maintenance  Topic Date Due  . COLONOSCOPY  02/06/2029 (Originally 10/12/1992)  . TETANUS/TDAP  09/16/2019  . INFLUENZA VACCINE  Completed  . PNA vac Low Risk Adult  Completed     Meds ordered this encounter  Medications  . Polyethyl Glycol-Propyl Glycol (SYSTANE ULTRA) 0.4-0.3 % SOLN    Sig: Apply 1 drop to eye 2 (two) times daily. Both eyes  . carboxymethylcellul-glycerin (REFRESH OPTIVE) 0.5-0.9 % ophthalmic solution    Sig: Place 2 drops into both eyes 2 (two) times daily as needed for dry eyes.    Current Outpatient Prescriptions:  .  alfuzosin (UROXATRAL) 10 MG 24 hr tablet, TAKE 1 TABLET (10MG) BY MOUTH ONCE DAILY WITH BREAKFAST*DO NOT CRUSH*, Disp: 30 tablet, Rfl: 11 .  ALPRAZolam (XANAX) 0.25 MG tablet, TAKE ONE TABLET BY MOUTH 2 TIMES A DAY AS NEEDED FOR ANXIETY (WHEN BLOOD PRESSURE IS VERY HIGH OR ANXIOUS), Disp: 20  tablet, Rfl: 0 .  aspirin 81 MG tablet, 1 tablet daily, Disp: 30 tablet, Rfl: 0 .  betamethasone dipropionate (DIPROLENE) 0.05 % cream, Apply topically as needed., Disp: 45 g, Rfl: 1 .  Calcium Carb-Cholecalciferol 500-400 MG-UNIT TABS, TAKE ONE TABLET BY MOUTH 2 TIMES A DAY, Disp: 60 tablet, Rfl: 12 .  carboxymethylcellul-glycerin (REFRESH OPTIVE) 0.5-0.9 % ophthalmic solution, Place 2 drops into both eyes 2 (two) times daily as needed for dry eyes., Disp: , Rfl:  .  cloNIDine (CATAPRES) 0.1 MG tablet, Take 1 tablet (0.1 mg total) by mouth 3 (three) times daily. (Patient taking differently: Take 0.1 mg by mouth as needed. ), Disp: 30 tablet, Rfl: 0 .  Cranberry 425 MG CAPS, Take 1 capsule by mouth daily., Disp: 30 capsule, Rfl: PRN .  Elastic Bandages & Supports (MEDICAL COMPRESSION STOCKINGS) MISC, 1 Units by Does not apply route daily., Disp: 2 each, Rfl: 0 .  hydrochlorothiazide (HYDRODIURIL) 12.5 MG tablet, Take 1 tablet (12.5 mg total) by mouth daily., Disp: 30 tablet, Rfl: 5 .  ibuprofen (ADVIL,MOTRIN) 800 MG tablet, Take 1 tablet (800 mg total) by mouth every 8 (eight) hours as needed., Disp: 30 tablet, Rfl: 0 .  Incontinence Supply Disposable (DEPEND SILHOUETTE BRIEFS L/XL) MISC, 1 each by Does  not apply route 4 (four) times daily., Disp: 120 each, Rfl: 5 .  Polyethyl Glycol-Propyl Glycol (SYSTANE ULTRA) 0.4-0.3 % SOLN, Apply 1 drop to eye 2 (two) times daily. Both eyes, Disp: , Rfl:  .  polyethylene glycol powder (GLYCOLAX/MIRALAX) powder, Take 17 g by mouth daily as needed., Disp: 3350 g, Rfl: 5 .  Polyvinyl Alcohol-Povidone 5-6 MG/ML SOLN, Apply 2 drops to eye 2 (two) times daily as needed., Disp: 1 Bottle, Rfl: 2 .  sertraline (ZOLOFT) 50 MG tablet, Take 1 tablet (50 mg total) by mouth daily., Disp: 30 tablet, Rfl: 5 .  vitamin C (ASCORBIC ACID) 500 MG tablet, Take 1 tablet (500 mg total) by mouth 2 (two) times daily., Disp: 60 tablet, Rfl: 12 There are no discontinued medications.  I  have personally reviewed and addressed the Medicare Annual Wellness health risk assessment questionnaire and have noted the following in the patient's chart:  A.         Medical and social history & family history B.         Use of alcohol, tobacco or illicit drugs  C.         Current medications and supplements D.         Functional and Cognitive ability and status E.         Nutritional status F.         Physical activity G.        Advance directives H.         List of other physicians I.          Hospitalizations, surgeries, and ER visits in previous 12 months J.         [place] such as hearing and vision if needed, cognitive and depression L.         Referrals and appointments - PT   In addition, I have reviewed and discussed with patient certain preventive protocols, quality metrics, and best practice recommendations. A written personalized care plan for preventive services as well as general preventive health recommendations were provided to patient.  See attached scanned questionnaire for additional information.

## 2017-02-26 ENCOUNTER — Encounter: Payer: Self-pay | Admitting: Family Medicine

## 2017-02-26 ENCOUNTER — Ambulatory Visit (INDEPENDENT_AMBULATORY_CARE_PROVIDER_SITE_OTHER): Payer: Medicare Other | Admitting: Family Medicine

## 2017-02-26 ENCOUNTER — Ambulatory Visit (INDEPENDENT_AMBULATORY_CARE_PROVIDER_SITE_OTHER): Payer: Medicare Other | Admitting: Vascular Surgery

## 2017-02-26 VITALS — BP 128/76 | HR 86 | Temp 98.1°F | Resp 18 | Ht 64.0 in | Wt 163.0 lb

## 2017-02-26 DIAGNOSIS — I10 Essential (primary) hypertension: Secondary | ICD-10-CM

## 2017-02-26 DIAGNOSIS — N3000 Acute cystitis without hematuria: Secondary | ICD-10-CM

## 2017-02-26 DIAGNOSIS — R8290 Unspecified abnormal findings in urine: Secondary | ICD-10-CM

## 2017-02-26 DIAGNOSIS — W19XXXA Unspecified fall, initial encounter: Secondary | ICD-10-CM | POA: Diagnosis not present

## 2017-02-26 DIAGNOSIS — Z9181 History of falling: Secondary | ICD-10-CM

## 2017-02-26 DIAGNOSIS — Z993 Dependence on wheelchair: Secondary | ICD-10-CM

## 2017-02-26 DIAGNOSIS — G809 Cerebral palsy, unspecified: Secondary | ICD-10-CM | POA: Diagnosis not present

## 2017-02-26 DIAGNOSIS — F320 Major depressive disorder, single episode, mild: Secondary | ICD-10-CM

## 2017-02-26 LAB — POCT URINALYSIS DIPSTICK
Bilirubin, UA: NEGATIVE
Blood, UA: NEGATIVE
GLUCOSE UA: NEGATIVE
Ketones, UA: NEGATIVE
Nitrite, UA: NEGATIVE
SPEC GRAV UA: 1.02 (ref 1.010–1.025)
UROBILINOGEN UA: 0.2 U/dL
pH, UA: 5 (ref 5.0–8.0)

## 2017-02-26 MED ORDER — CIPROFLOXACIN HCL 250 MG PO TABS: 250.0000 mg | ORAL_TABLET | Freq: Two times a day (BID) | ORAL | 0 refills | Status: AC

## 2017-02-26 NOTE — Patient Instructions (Addendum)
Fall Prevention in the Home Falls can cause injuries. They can happen to people of all ages. There are many things you can do to make your home safe and to help prevent falls. What can I do on the outside of my home?  Regularly fix the edges of walkways and driveways and fix any cracks.  Remove anything that might make you trip as you walk through a door, such as a raised step or threshold.  Trim any bushes or trees on the path to your home.  Use bright outdoor lighting.  Clear any walking paths of anything that might make someone trip, such as rocks or tools.  Regularly check to see if handrails are loose or broken. Make sure that both sides of any steps have handrails.  Any raised decks and porches should have guardrails on the edges.  Have any leaves, snow, or ice cleared regularly.  Use sand or salt on walking paths during winter.  Clean up any spills in your garage right away. This includes oil or grease spills. What can I do in the bathroom?  Use night lights.  Install grab bars by the toilet and in the tub and shower. Do not use towel bars as grab bars.  Use non-skid mats or decals in the tub or shower.  If you need to sit down in the shower, use a plastic, non-slip stool.  Keep the floor dry. Clean up any water that spills on the floor as soon as it happens.  Remove soap buildup in the tub or shower regularly.  Attach bath mats securely with double-sided non-slip rug tape.  Do not have throw rugs and other things on the floor that can make you trip. What can I do in the bedroom?  Use night lights.  Make sure that you have a light by your bed that is easy to reach.  Do not use any sheets or blankets that are too big for your bed. They should not hang down onto the floor.  Have a firm chair that has side arms. You can use this for support while you get dressed.  Do not have throw rugs and other things on the floor that can make you trip. What can I do in the  kitchen?  Clean up any spills right away.  Avoid walking on wet floors.  Keep items that you use a lot in easy-to-reach places.  If you need to reach something above you, use a strong step stool that has a grab bar.  Keep electrical cords out of the way.  Do not use floor polish or wax that makes floors slippery. If you must use wax, use non-skid floor wax.  Do not have throw rugs and other things on the floor that can make you trip. What can I do with my stairs?  Do not leave any items on the stairs.  Make sure that there are handrails on both sides of the stairs and use them. Fix handrails that are broken or loose. Make sure that handrails are as long as the stairways.  Check any carpeting to make sure that it is firmly attached to the stairs. Fix any carpet that is loose or worn.  Avoid having throw rugs at the top or bottom of the stairs. If you do have throw rugs, attach them to the floor with carpet tape.  Make sure that you have a light switch at the top of the stairs and the bottom of the stairs. If you do   not have them, ask someone to add them for you. What else can I do to help prevent falls?  Wear shoes that: ? Do not have high heels. ? Have rubber bottoms. ? Are comfortable and fit you well. ? Are closed at the toe. Do not wear sandals.  If you use a stepladder: ? Make sure that it is fully opened. Do not climb a closed stepladder. ? Make sure that both sides of the stepladder are locked into place. ? Ask someone to hold it for you, if possible.  Clearly mark and make sure that you can see: ? Any grab bars or handrails. ? First and last steps. ? Where the edge of each step is.  Use tools that help you move around (mobility aids) if they are needed. These include: ? Canes. ? Walkers. ? Scooters. ? Crutches.  Turn on the lights when you go into a dark area. Replace any light bulbs as soon as they burn out.  Set up your furniture so you have a clear path.  Avoid moving your furniture around.  If any of your floors are uneven, fix them.  If there are any pets around you, be aware of where they are.  Review your medicines with your doctor. Some medicines can make you feel dizzy. This can increase your chance of falling. Ask your doctor what other things that you can do to help prevent falls. This information is not intended to replace advice given to you by your health care provider. Make sure you discuss any questions you have with your health care provider. Document Released: 02/04/2009 Document Revised: 09/16/2015 Document Reviewed: 05/15/2014 Elsevier Interactive Patient Education  2018 [employer].  Urinary Tract Infection, Adult A urinary tract infection (UTI) is an infection of any part of the urinary tract. The urinary tract includes the:  Kidneys.  Ureters.  Bladder.  Urethra.  These organs make, store, and get rid of pee (urine) in the body. Follow these instructions at home:  Take over-the-counter and prescription medicines only as told by your doctor.  If you were prescribed an antibiotic medicine, take it as told by your doctor. Do not stop taking the antibiotic even if you start to feel better.  Avoid the following drinks: ? Alcohol. ? Caffeine. ? Tea. ? Carbonated drinks.  Drink enough fluid to keep your pee clear or pale yellow.  Keep all follow-up visits as told by your doctor. This is important.  Make sure to: ? Empty your bladder often and completely. Do not to hold pee for long periods of time. ? Empty your bladder before and after sex. ? Wipe from front to back after a bowel movement if you are male. Use each tissue one time when you wipe. Contact a doctor if:  You have back pain.  You have a fever.  You feel sick to your stomach (nauseous).  You throw up (vomit).  Your symptoms do not get better after 3 days.  Your symptoms go away and then come back. Get help right away if:  You have  very bad back pain.  You have very bad lower belly (abdominal) pain.  You are throwing up and cannot keep down any medicines or water. This information is not intended to replace advice given to you by your health care provider. Make sure you discuss any questions you have with your health care provider. Document Released: 09/27/2007 Document Revised: 09/16/2015 Document Reviewed: 03/01/2015 Elsevier Interactive Patient Education  [employer].  DASH Eating Plan DASH stands for "Dietary Approaches to Stop Hypertension." The DASH eating plan is a healthy eating plan that has been shown to reduce high blood pressure (hypertension). It may also reduce your risk for type 2 diabetes, heart disease, and stroke. The DASH eating plan may also help with weight loss. What are tips for following this plan? General guidelines  Avoid eating more than 2,300 mg (milligrams) of salt (sodium) a day. If you have hypertension, you may need to reduce your sodium intake to 1,500 mg a day.  Limit alcohol intake to no more than 1 drink a day for nonpregnant women and 2 drinks a day for men. One drink equals 12 oz of beer, 5 oz of wine, or 1 oz of hard liquor.  Work with your health care provider to maintain a healthy body weight or to lose weight. Ask what an ideal weight is for you.  Get at least 30 minutes of exercise that causes your heart to beat faster (aerobic exercise) most days of the week. Activities may include walking, swimming, or biking.  Work with your health care provider or diet and nutrition specialist (dietitian) to adjust your eating plan to your individual calorie needs. Reading food labels  Check food labels for the amount of sodium per serving. Choose foods with less than 5 percent of the Daily Value of sodium. Generally, foods with less than 300 mg of sodium per serving fit into this eating plan.  To find whole grains, look for the word "whole" as the first word in the ingredient  list. Shopping  Buy products labeled as "low-sodium" or "no salt added."  Buy fresh foods. Avoid canned foods and premade or frozen meals. Cooking  Avoid adding salt when cooking. Use salt-free seasonings or herbs instead of table salt or sea salt. Check with your health care provider or pharmacist before using salt substitutes.  Do not fry foods. Cook foods using healthy methods such as baking, boiling, grilling, and broiling instead.  Cook with heart-healthy oils, such as olive, canola, soybean, or sunflower oil. Meal planning   Eat a balanced diet that includes: ? 5 or more servings of fruits and vegetables each day. At each meal, try to fill half of your plate with fruits and vegetables. ? Up to 6-8 servings of whole grains each day. ? Less than 6 oz of lean meat, poultry, or fish each day. A 3-oz serving of meat is about the same size as a deck of cards. One egg equals 1 oz. ? 2 servings of low-fat dairy each day. ? A serving of nuts, seeds, or beans 5 times each week. ? Heart-healthy fats. Healthy fats called Omega-3 fatty acids are found in foods such as flaxseeds and coldwater fish, like sardines, salmon, and mackerel.  Limit how much you eat of the following: ? Canned or prepackaged foods. ? Food that is high in trans fat, such as fried foods. ? Food that is high in saturated fat, such as fatty meat. ? Sweets, desserts, sugary drinks, and other foods with added sugar. ? Full-fat dairy products.  Do not salt foods before eating.  Try to eat at least 2 vegetarian meals each week.  Eat more home-cooked food and less restaurant, buffet, and fast food.  When eating at a restaurant, ask that your food be prepared with less salt or no salt, if possible. What foods are recommended? The items listed may not be a complete list. Talk with your dietitian about what  dietary choices are best for you. Grains Whole-grain or whole-wheat bread. Whole-grain or whole-wheat pasta. Brown  rice. [place]. Bulgur. Whole-grain and low-sodium cereals. Pita bread. Low-fat, low-sodium crackers. Whole-wheat flour tortillas. Vegetables Fresh or frozen vegetables (raw, steamed, roasted, or grilled). Low-sodium or reduced-sodium tomato and vegetable juice. Low-sodium or reduced-sodium tomato sauce and tomato paste. Low-sodium or reduced-sodium canned vegetables. Fruits All fresh, dried, or frozen fruit. Canned fruit in natural juice (without added sugar). Meat and other protein foods Skinless chicken or [place]. Ground chicken or [place]. Pork with fat trimmed off. Fish and seafood. Egg whites. Dried beans, peas, or lentils. Unsalted nuts, nut butters, and seeds. Unsalted canned beans. Lean cuts of beef with fat trimmed off. Low-sodium, lean deli meat. Dairy Low-fat (1%) or fat-free (skim) milk. Fat-free, low-fat, or reduced-fat cheeses. Nonfat, low-sodium ricotta or cottage cheese. Low-fat or nonfat yogurt. Low-fat, low-sodium cheese. Fats and oils Soft margarine without trans fats. Vegetable oil. Low-fat, reduced-fat, or light mayonnaise and salad dressings (reduced-sodium). Canola, safflower, olive, soybean, and sunflower oils. Avocado. Seasoning and other foods Herbs. Spices. Seasoning mixes without salt. Unsalted popcorn and pretzels. Fat-free sweets. What foods are not recommended? The items listed may not be a complete list. Talk with your dietitian about what dietary choices are best for you. Grains Baked goods made with fat, such as croissants, muffins, or some breads. Dry pasta or rice meal packs. Vegetables Creamed or fried vegetables. Vegetables in a cheese sauce. Regular canned vegetables (not low-sodium or reduced-sodium). Regular canned tomato sauce and paste (not low-sodium or reduced-sodium). Regular tomato and vegetable juice (not low-sodium or reduced-sodium). Angie Fava. Olives. Fruits Canned fruit in a light or heavy syrup. Fried fruit. Fruit in cream or butter  sauce. Meat and other protein foods Fatty cuts of meat. Ribs. Fried meat. Berniece Salines. Sausage. Bologna and other processed lunch meats. Salami. Fatback. Hotdogs. Bratwurst. Salted nuts and seeds. Canned beans with added salt. Canned or smoked fish. Whole eggs or egg yolks. Chicken or [place] with skin. Dairy Whole or 2% milk, cream, and half-and-half. Whole or full-fat cream cheese. Whole-fat or sweetened yogurt. Full-fat cheese. Nondairy creamers. Whipped toppings. Processed cheese and cheese spreads. Fats and oils Butter. Stick margarine. Lard. Shortening. Ghee. Bacon fat. Tropical oils, such as coconut, palm kernel, or palm oil. Seasoning and other foods Salted popcorn and pretzels. Onion salt, garlic salt, seasoned salt, table salt, and sea salt. Worcestershire sauce. Tartar sauce. Barbecue sauce. Teriyaki sauce. Soy sauce, including reduced-sodium. Steak sauce. Canned and packaged gravies. Fish sauce. Oyster sauce. Cocktail sauce. Horseradish that you find on the shelf. Ketchup. Mustard. Meat flavorings and tenderizers. Bouillon cubes. Hot sauce and Tabasco sauce. Premade or packaged marinades. Premade or packaged taco seasonings. Relishes. Regular salad dressings. Where to find more information:  National Heart, Lung, and [place]: https://wilson-eaton.com/  American Heart Association: www.heart.org Summary  The DASH eating plan is a healthy eating plan that has been shown to reduce high blood pressure (hypertension). It may also reduce your risk for type 2 diabetes, heart disease, and stroke.  With the DASH eating plan, you should limit salt (sodium) intake to 2,300 mg a day. If you have hypertension, you may need to reduce your sodium intake to 1,500 mg a day.  When on the DASH eating plan, aim to eat more fresh fruits and vegetables, whole grains, lean proteins, low-fat dairy, and heart-healthy fats.  Work with your health care provider or diet and nutrition specialist (dietitian) to adjust  your eating plan to your  individual calorie needs. This information is not intended to replace advice given to you by your health care provider. Make sure you discuss any questions you have with your health care provider. Document Released: 03/30/2011 Document Revised: 04/03/2016 Document Reviewed: 04/03/2016 Elsevier Interactive Patient Education  2017 [employer].

## 2017-02-26 NOTE — Progress Notes (Addendum)
Name: [name]   MRN: [id number]    DOB: [birth year]   Date:02/26/2017       Progress Note  Subjective  Chief Complaint  Chief Complaint  Patient presents with  . Urinary Tract Infection    per care home  . Fall    fell out of bed today and fell in bathroom last night, Sore left forehead, Pain left knee  . Leaning    patient is leaning more to right  side    HPI  [place] and Peggy from [name] are with patient and provide the history.   Foul Smelling Urine: Foul smelling urine and darker urine x3 days; acting like he is feeling weak, sometimes leaning on his wheelchair, more agitated and fussy than normal x5-7 days.  Pt denies nausea, abdominal pain, or dysuria; no fevers or chills at home.  Falls: Yesterday pt was getting out of the shower, slid to the ground in the bathroom.  Today he was transferring from bed to wheelchair and slipped to the ground.  "Carpet burn" above left eye and left knee. He has not complained of any MSK pain, only discomfort on scrapes above left eye and left knee.  No acute behavior changes - only mood changes over the last 5-7 days as above.  HTN: his bp started to go up around June 2018, unknown cause. We tried alprazolam for a period of time, but continued to go up, so we added HCTZ; bp at home has been well controlled since - 130's/80's. He denies chest pain, palpitation or SOB.  Has not needed clonidine for a couple of months.  Still has some Xanax left that they will keep on hand PRN for agitation. Last CMP was 11/27/2016 and was kidney function and potassium were excellent.  Mild Depression: he is doing well on Zoloft, no recent crying spells, he can get angry when aggravated, but Zoloft seems to continue to help decrease the severity of these episodes. Caregivers state dose seems to be good for him.   Lower extremity edema: wearing a pump twice daily and compression stocking hoses and is doing well, swelling is down, mostly on ankles now, legs  are not sore or red.  CP/Wheelchair bound: Patient unable to afford PT until the beginning of the year when his insurance renews.  Last PT visit was about a week ago, and insurance will no longer cover for this calendar year.  Caregivers will ask PT to provide home exercises that they can do with pt in the meantime.  Patient Active Problem List   Diagnosis Date Noted  . Hypertension 10/26/2016  . Late effect acute polio 05/17/2016  . Chronic constipation 11/19/2015  . Mild major depression ([place]) 11/19/2015  . Complicated grieving 35/36/1443  . Recurrent UTI 09/11/2015  . Adenocarcinoma of prostate ([place]) 03/02/2015  . Amyotrophia 03/02/2015  . Edema leg 03/02/2015  . Cerebral palsy ([place]) 03/02/2015  . Dyslipidemia 03/02/2015  . Dermatitis, eczematoid 03/02/2015  . 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 H/O acute poliomyelitis 03/02/2015  . Lymphedema 03/02/2015  . Mental retardation 03/02/2015  . Hypertrophy of nail 03/02/2015  . Adiposity 03/02/2015  . Absence of bladder continence 03/02/2015  . Chronic venous insufficiency 03/02/2015  . Avitaminosis D 03/02/2015  . Adynamia 03/02/2015  . Dependent on wheelchair 03/02/2015  . Kidney cysts 03/02/2015  . At risk for falling 03/02/2015  . Cerebral vascular disease 03/02/2015  . Strabismus 02/11/2015  . Urge incontinence of urine 02/11/2015  . Wheelchair dependence 02/11/2015  .  Obesity 02/11/2015  . Urinary frequency 10/30/2014    Past Surgical History:  Procedure Laterality Date  . leg circulation surgery Right   . PROSTATE SURGERY  11/10/2008   BRACHYTHERAPY    Family History  Problem Relation Age of Onset  . Heart attack Brother   . Heart disease Unknown     Social History   Socioeconomic History  . Marital status: Single    Spouse name: Not on file  . Number of children: Not on file  . Years of education: Not on file  . Highest education level: Not on file  Social Needs  . Financial resource strain: Not on file   . Food insecurity - worry: Not on file  . Food insecurity - inability: Not on file  . Transportation needs - medical: Not on file  . Transportation needs - non-medical: Not on file  Occupational History  . Not on file  Tobacco Use  . Smoking status: Never Smoker  . Smokeless tobacco: Never Used  Substance and Sexual Activity  . Alcohol use: No    Alcohol/week: 0.0 oz  . Drug use: No  . Sexual activity: No  Other Topics Concern  . Not on file  Social History Narrative   Lives at [facility] group home, 5 males.          Current Outpatient Medications:  .  alfuzosin (UROXATRAL) 10 MG 24 hr tablet, TAKE 1 TABLET (10MG ) BY MOUTH ONCE DAILY WITH BREAKFAST*DO NOT CRUSH*, Disp: 30 tablet, Rfl: 11 .  ALPRAZolam (XANAX) 0.25 MG tablet, TAKE ONE TABLET BY MOUTH 2 TIMES A DAY AS NEEDED FOR ANXIETY (WHEN BLOOD PRESSURE IS VERY HIGH OR ANXIOUS), Disp: 20 tablet, Rfl: 0 .  aspirin 81 MG tablet, 1 tablet daily, Disp: 30 tablet, Rfl: 0 .  betamethasone dipropionate (DIPROLENE) 0.05 % cream, Apply topically as needed., Disp: 45 g, Rfl: 1 .  Calcium Carb-Cholecalciferol 500-400 MG-UNIT TABS, TAKE ONE TABLET BY MOUTH 2 TIMES A DAY, Disp: 60 tablet, Rfl: 12 .  carboxymethylcellul-glycerin (REFRESH OPTIVE) 0.5-0.9 % ophthalmic solution, Place 2 drops into both eyes 2 (two) times daily as needed for dry eyes., Disp: , Rfl:  .  Cranberry 425 MG CAPS, Take 1 capsule by mouth daily., Disp: 30 capsule, Rfl: PRN .  Elastic Bandages & Supports (MEDICAL COMPRESSION STOCKINGS) MISC, 1 Units by Does not apply route daily., Disp: 2 each, Rfl: 0 .  hydrochlorothiazide (HYDRODIURIL) 12.5 MG tablet, Take 1 tablet (12.5 mg total) by mouth daily., Disp: 30 tablet, Rfl: 5 .  ibuprofen (ADVIL,MOTRIN) 800 MG tablet, Take 1 tablet (800 mg total) by mouth every 8 (eight) hours as needed., Disp: 30 tablet, Rfl: 0 .  Incontinence Supply Disposable (DEPEND SILHOUETTE BRIEFS L/XL) MISC, 1 each by Does not apply route 4 (four)  times daily., Disp: 120 each, Rfl: 5 .  Polyethyl Glycol-Propyl Glycol (SYSTANE ULTRA) 0.4-0.3 % SOLN, Apply 1 drop to eye 2 (two) times daily. Both eyes, Disp: , Rfl:  .  polyethylene glycol powder (GLYCOLAX/MIRALAX) powder, Take 17 g by mouth daily as needed., Disp: 3350 g, Rfl: 5 .  Polyvinyl Alcohol-Povidone 5-6 MG/ML SOLN, Apply 2 drops to eye 2 (two) times daily as needed., Disp: 1 Bottle, Rfl: 2 .  sertraline (ZOLOFT) 50 MG tablet, Take 1 tablet (50 mg total) by mouth daily., Disp: 30 tablet, Rfl: 5 .  vitamin C (ASCORBIC ACID) 500 MG tablet, Take 1 tablet (500 mg total) by mouth 2 (two) times daily., Disp: 60  tablet, Rfl: 12 .  ciprofloxacin (CIPRO) 250 MG tablet, Take 1 tablet (250 mg total) 2 (two) times daily for 5 days by mouth., Disp: 10 tablet, Rfl: 0  No Known Allergies   ROS  Constitutional: Negative for fever or weight change. Positive for behavior changes as above. Respiratory: Negative for cough and shortness of breath.   Cardiovascular: Negative for chest pain or palpitations.  Gastrointestinal: Negative for abdominal pain, no bowel changes.  Musculoskeletal: Has chronic LE edema, much improved.   Skin: Negative for rash. Positive for small wound above LEFT eye and LEFT knee. Neurological: Negative for dizziness or headache.  No other specific complaints in a complete review of systems (except as listed in HPI above).  Objective  Vitals:   02/26/17 0934  BP: 128/76  Pulse: 86  Resp: 18  Temp: 98.1 F (36.7 C)  TempSrc: Oral  SpO2: 96%  Weight: 163 lb (73.9 kg)  Height: 5\' 4"  (1.626 m)   Body mass index is 27.98 kg/m.  Physical Exam Constitutional: Patient appears well-developed and well-nourished. No distress.  HENT: Head: Normocephalic and atraumatic. Ears: B TMs ok, no erythema or effusion; Nose: Nose normal. Mouth/Throat: Oropharynx is clear and moist. No oropharyngeal exudate.  Eyes: Conjunctivae and EOM are normal. Pupils are equal, round, and  reactive to light. No scleral icterus.  Neck: Normal range of motion. Neck supple. No JVD present. No thyromegaly present.  Cardiovascular: Normal rate, regular rhythm and normal heart sounds.  No murmur heard. Trace BLE edema - wearing compression stockings. Pulmonary/Chest: Effort normal and breath sounds normal. No respiratory distress. Abdominal: Soft. Bowel sounds are normal, no distension. There is no tenderness. no masses. No CVA tenderness. Musculoskeletal: Normal range of motion, no joint effusions. No gross deformities, crepitus, or instability of joints. LEFT knee is non-tender with normal popliteal fossa, no edema.  Facial bones are stable without crepitus or tenderness. Neurological: he is alert and oriented to baseline.  Speech is dysarthric at baseline. Coordination, balance, strength, speech are at baseline for the patient. Pt is wheelchair bound. Skin: Skin is warm and dry. No rash noted. Small, very superficial, mildly erythematous wound to LEFT knee and above LEFT eyebrow - no bleeding or exudate, no pain on palpation.  Psychiatric: Patient has a pleasant mood and affect. behavior is baseline. Judgment and thought content baseline.  Recent Results (from the past 2160 hour(s))  POCT urinalysis dipstick     Status: Abnormal   Collection Time: 02/26/17  9:45 AM  Result Value Ref Range   Color, UA yellow    Clarity, UA clear    Glucose, UA negative    Bilirubin, UA negative    Ketones, UA negative    Spec Grav, UA 1.020 1.010 - 1.025   Blood, UA negative    pH, UA 5.0 5.0 - 8.0   Protein, UA trace    Urobilinogen, UA 0.2 0.2 or 1.0 E.U./dL   Nitrite, UA negative    Leukocytes, UA Trace (A) Negative    PHQ2/9: Depression screen [facility] 2/9 02/14/2017 02/14/2016 12/23/2015 11/19/2015 08/19/2015  Decreased Interest 0 0 0 0 0  Down, Depressed, Hopeless 0 0 0 0 1  PHQ - 2 Score 0 0 0 0 1   Fall Risk: Fall Risk  02/14/2017 02/14/2016 12/23/2015 11/19/2015 08/19/2015  Falls in the  past year? Yes Yes Yes Yes No  Number falls in past yr: 2 or more 2 or more 2 or more 1 -  Injury with Fall? No  Yes Yes No -  Risk Factor Category  - High Fall Risk High Fall Risk - -  Risk for fall due to : Impaired balance/gait;Impaired mobility;History of fall(s) - History of fall(s);Impaired balance/gait;Impaired mobility - -   Assessment & Plan  1. Acute cystitis without hematuria - ciprofloxacin (CIPRO) 250 MG tablet; Take 1 tablet (250 mg total) 2 (two) times daily for 5 days by mouth.  Dispense: 10 tablet; Refill: 0 - Urine Culture - Advised that UA shows only leukocytes, however with foul smelling and darker than normal urine coupled with behavior changes over the same period of time, we will treat for UTI.  Advised that if he is not improving or if he is worsening, he needs to return for re-evaluation, and we will connect with Dr. Jacqlyn Larsen at that time.  2. Mild major depression ([place]) Continue Zoloft; Xanax PRN for agitation  3. Essential hypertension Continue HCTZ  4. Cerebral palsy, unspecified type [facility]) - Home health PT order was placed by Dr. Ancil Boozer at last visit, we will await the beginning on next calendar year to re-implement as insurance is no longer covering. Caregivers will obtain home exercises they can perform with patient over the next 2 months to supplement while he awaits these services.  5. At risk for falling Fall precautions reviewed  6. Fall, initial encounter Fall precautions reviewed.  Advised no concerning findings today, no need for imaging. Continue to monitor neurologic status and patient complaints of pain - return if any new or concerning/worsening symptoms  7. Abnormal urine odor - POCT urinalysis dipstick - Urine Culture  8. Dependent on wheelchair Fall precautions reviewed, especially with transfers.  -Red flags and when to present for emergency care or RTC including fever >101.54F, chest pain, shortness of breath, abdominal pain, change in  mental status or physical ability new/worsening/un-resolving symptoms, reviewed with patient at time of visit. Follow up and care instructions discussed and provided in AVS.

## 2017-02-27 ENCOUNTER — Ambulatory Visit: Payer: Medicare Other | Admitting: Family Medicine

## 2017-03-12 ENCOUNTER — Encounter (INDEPENDENT_AMBULATORY_CARE_PROVIDER_SITE_OTHER): Payer: Self-pay | Admitting: Vascular Surgery

## 2017-03-12 ENCOUNTER — Ambulatory Visit (INDEPENDENT_AMBULATORY_CARE_PROVIDER_SITE_OTHER): Payer: Medicare Other | Admitting: Vascular Surgery

## 2017-03-12 VITALS — BP 127/83 | HR 67 | Resp 17 | Ht 67.0 in

## 2017-03-12 DIAGNOSIS — I872 Venous insufficiency (chronic) (peripheral): Secondary | ICD-10-CM

## 2017-03-12 DIAGNOSIS — I10 Essential (primary) hypertension: Secondary | ICD-10-CM | POA: Diagnosis not present

## 2017-03-12 DIAGNOSIS — E785 Hyperlipidemia, unspecified: Secondary | ICD-10-CM | POA: Diagnosis not present

## 2017-03-12 DIAGNOSIS — I890 Lymphedema, not elsewhere classified: Secondary | ICD-10-CM

## 2017-03-18 ENCOUNTER — Encounter (INDEPENDENT_AMBULATORY_CARE_PROVIDER_SITE_OTHER): Payer: Self-pay | Admitting: Vascular Surgery

## 2017-03-18 NOTE — Progress Notes (Signed)
MRN : [id number]  [name] is a 76 y.o. ([birth year]) male who presents with chief complaint of  Chief Complaint  Patient presents with  . Follow-up    6 month f/u no studies, frequent falls  .  History of Present Illness: The patient returns to the office for followup evaluation regarding leg swelling.  The swelling has persisted but with the lymph pump is much, much better controlled. The pain associated with swelling is essentially eliminated. There have not been any interval development of a ulcerations or wounds.  The patient denies problems with the pump, noting it is working well and the leggings are in good condition.  Since the previous visit the patient has been wearing graduated compression stockings and using the lymph pump on a routine basis and  has noted significant improvement in the lymphedema.   Patient stated the lymph pump has been a very positive factor in her care.    Current Meds  Medication Sig  . alfuzosin (UROXATRAL) 10 MG 24 hr tablet TAKE 1 TABLET (10MG ) BY MOUTH ONCE DAILY WITH BREAKFAST*DO NOT CRUSH*  . ALPRAZolam (XANAX) 0.25 MG tablet TAKE ONE TABLET BY MOUTH 2 TIMES A DAY AS NEEDED FOR ANXIETY (WHEN BLOOD PRESSURE IS VERY HIGH OR ANXIOUS)  . aspirin 81 MG tablet 1 tablet daily  . betamethasone dipropionate (DIPROLENE) 0.05 % cream Apply topically as needed.  . Calcium Carb-Cholecalciferol 500-400 MG-UNIT TABS TAKE ONE TABLET BY MOUTH 2 TIMES A DAY  . carboxymethylcellul-glycerin (REFRESH OPTIVE) 0.5-0.9 % ophthalmic solution Place 2 drops into both eyes 2 (two) times daily as needed for dry eyes.  . Cranberry 425 MG CAPS Take 1 capsule by mouth daily.  Regino Schultze Bandages & Supports (MEDICAL COMPRESSION STOCKINGS) MISC 1 Units by Does not apply route daily.  . hydrochlorothiazide (HYDRODIURIL) 12.5 MG tablet Take 1 tablet (12.5 mg total) by mouth daily.  Marland Kitchen ibuprofen (ADVIL,MOTRIN) 800 MG tablet Take 1 tablet (800 mg total) by mouth every 8  (eight) hours as needed.  . Incontinence Supply Disposable (DEPEND SILHOUETTE BRIEFS L/XL) MISC 1 each by Does not apply route 4 (four) times daily.  [name] (SYSTANE ULTRA) 0.4-0.3 % SOLN Apply 1 drop to eye 2 (two) times daily. Both eyes  . polyethylene Macias powder (GLYCOLAX/MIRALAX) powder Take 17 g by mouth daily as needed.  . Polyvinyl Alcohol-Povidone 5-6 MG/ML SOLN Apply 2 drops to eye 2 (two) times daily as needed.  . sertraline (ZOLOFT) 50 MG tablet Take 1 tablet (50 mg total) by mouth daily.  . vitamin C (ASCORBIC ACID) 500 MG tablet Take 1 tablet (500 mg total) by mouth 2 (two) times daily.    Past Medical History:  Diagnosis Date  . Atrophy, disuse, muscle   . BPH (benign prostatic hyperplasia)   . Cerebral palsy ([place])   . Eczema   . Elevated PSA   . Exotropia   . Hematuria, gross   . Hyperlipidemia   . Incontinence   . Lower urinary tract symptoms   . Lymphedema   . Mental retardation   . Over weight   . Prostate cancer ([place])   . Renal cyst   . Urinary frequency   . Urinary urgency   . Venous insufficiency   . Vitamin D deficiency   . Wheelchair dependence     Past Surgical History:  Procedure Laterality Date  . leg circulation surgery Right   . PROSTATE SURGERY  11/10/2008   BRACHYTHERAPY  Social History Social History   Tobacco Use  . Smoking status: Never Smoker  . Smokeless tobacco: Never Used  Substance Use Topics  . Alcohol use: No    Alcohol/week: 0.0 oz  . Drug use: No    Family History Family History  Problem Relation Age of Onset  . Heart attack Brother   . Heart disease Unknown     No Known Allergies   REVIEW OF SYSTEMS (Negative unless checked)  Constitutional: [] Weight loss  [] Fever  [] Chills Cardiac: [] Chest pain   [] Chest pressure   [] Palpitations   [] Shortness of breath when laying flat   [] Shortness of breath with exertion. Vascular:  [] Pain in legs with walking   [] Pain in legs at rest   [] History of DVT   [] Phlebitis   [x] Swelling in legs   [] Varicose veins   [] Non-healing ulcers Pulmonary:   [] Uses home oxygen   [] Productive cough   [] Hemoptysis   [] Wheeze  [] COPD   [] Asthma Neurologic:  [] Dizziness   [] Seizures   [] History of stroke   [] History of TIA  [] Aphasia   [] Vissual changes   [] Weakness or numbness in arm   [] Weakness or numbness in leg Musculoskeletal:   [] Joint swelling   [] Joint pain   [] Low back pain Hematologic:  [] Easy bruising  [] Easy bleeding   [] Hypercoagulable state   [] Anemic Gastrointestinal:  [] Diarrhea   [] Vomiting  [] Gastroesophageal reflux/heartburn   [] Difficulty swallowing. Genitourinary:  [] Chronic kidney disease   [] Difficult urination  [] Frequent urination   [] Blood in urine Skin:  [] Rashes   [] Ulcers  Psychological:  [] History of anxiety   []  History of major depression.  Physical Examination  Vitals:   03/12/17 1117  BP: 127/83  Pulse: 67  Resp: 17  Height: 5\' 7"  (1.702 m)   Body mass index is 25.53 kg/m. Gen: WD/WN, NAD in a wheelchair Head: [place]/AT, No temporalis wasting.  Ear/Nose/Throat: Hearing grossly intact, nares w/o erythema or drainage Eyes: PER, EOMI, sclera nonicteric.  Neck: Supple, no large masses.   Pulmonary:  Good air movement, no audible wheezing bilaterally, no use of accessory muscles.  Cardiac: RRR, no JVD Vascular:  varicosities present bilaterally.  severe venous stasis changes to the legs bilaterally.  3+ soft pitting edema Vessel Right Left  Radial Palpable Palpable  Gastrointestinal: Non-distended. No guarding/no peritoneal signs.  Musculoskeletal: M/S 5/5 throughout upper 3/5 lower.  Wheelchair bound.  Neurologic: CN 2-12 intact. Symmetrical.  Speech is fluent. Motor exam as listed above. Psychiatric: Judgmentpoor, Mood & affect appropriate for pt's clinical situation. Dermatologic: No rashes or ulcers noted.  No changes consistent with cellulitis. Lymph : No lichenification or skin changes of chronic  lymphedema.  CBC Lab Results  Component Value Date   WBC 4.8 11/27/2016   HGB 15.2 11/27/2016   HCT 43.7 11/27/2016   MCV 94.8 11/27/2016   PLT 165 11/27/2016    BMET    Component Value Date/Time   NA 141 11/27/2016 0900   NA 139 02/11/2015 1404   NA 139 08/14/2013 1618   K 4.0 11/27/2016 0900   K 3.8 08/14/2013 1618   CL 100 11/27/2016 0900   CL 108 (H) 08/14/2013 1618   CO2 26 11/27/2016 0900   CO2 28 08/14/2013 1618   GLUCOSE 87 11/27/2016 0900   GLUCOSE 99 08/14/2013 1618   BUN 15 11/27/2016 0900   BUN 11 02/11/2015 1404   BUN 14 08/14/2013 1618   CREATININE 0.72 11/27/2016 0900   CALCIUM 9.8 11/27/2016 0900  CALCIUM 8.4 (L) 08/14/2013 1618   GFRNONAA >89 11/27/2016 0900   GFRAA >89 11/27/2016 0900   CrCl cannot be calculated (Patient's most recent lab result is older than the maximum 21 days allowed.).  COAG No results found for: INR, PROTIME  Radiology No results found.  Assessment/Plan 1. Lymphedema  No surgery or intervention at this point in time.    I have reviewed my discussion with the patient regarding lymphedema and why it  causes symptoms.  Patient will continue wearing graduated compression stockings class 1 (20-30 mmHg) on a daily basis a prescription was given. The patient is reminded to put the stockings on first thing in the morning and removing them in the evening. The patient is instructed specifically not to sleep in the stockings.   In addition, behavioral modification throughout the day will be continued.  This will include frequent elevation (such as in a recliner), use of over the counter pain medications as needed and exercise such as walking.  I have reviewed systemic causes for chronic edema such as liver, kidney and cardiac etiologies and there does not appear to be any significant changes in these organ systems over the past year.  The patient is under the impression that these organ systems are all stable and unchanged.    The  patient will continue aggressive use of the  lymph pump.  This will continue to improve the edema control and prevent sequela such as ulcers and infections.   The patient will follow-up with me on an annual basis.    2. Chronic venous insufficiency  No surgery or intervention at this point in time.    I have reviewed my discussion with the patient regarding lymphedema and why it  causes symptoms.  Patient will continue wearing graduated compression stockings class 1 (20-30 mmHg) on a daily basis a prescription was given. The patient is reminded to put the stockings on first thing in the morning and removing them in the evening. The patient is instructed specifically not to sleep in the stockings.   In addition, behavioral modification throughout the day will be continued.  This will include frequent elevation (such as in a recliner), use of over the counter pain medications as needed and exercise such as walking.  I have reviewed systemic causes for chronic edema such as liver, kidney and cardiac etiologies and there does not appear to be any significant changes in these organ systems over the past year.  The patient is under the impression that these organ systems are all stable and unchanged.    The patient will continue aggressive use of the  lymph pump.  This will continue to improve the edema control and prevent sequela such as ulcers and infections.   The patient will follow-up with me on an annual basis.    3. Essential hypertension Continue antihypertensive medications as already ordered, these medications have been reviewed and there are no changes at this time.   4. Dyslipidemia Continue statin as ordered and reviewed, no changes at this time     Hortencia Pilar, MD  03/18/2017 4:38 PM

## 2017-03-24 DIAGNOSIS — S300XXA Contusion of lower back and pelvis, initial encounter: Secondary | ICD-10-CM | POA: Diagnosis not present

## 2017-05-10 ENCOUNTER — Ambulatory Visit: Payer: Medicare Other | Admitting: Family Medicine

## 2017-05-17 ENCOUNTER — Ambulatory Visit: Payer: Medicare Other | Admitting: Family Medicine

## 2017-05-23 DIAGNOSIS — S300XXA Contusion of lower back and pelvis, initial encounter: Secondary | ICD-10-CM | POA: Diagnosis not present

## 2017-05-23 DIAGNOSIS — M545 Low back pain: Secondary | ICD-10-CM | POA: Diagnosis not present

## 2017-05-30 ENCOUNTER — Encounter: Payer: Self-pay | Admitting: Family Medicine

## 2017-05-30 ENCOUNTER — Ambulatory Visit (INDEPENDENT_AMBULATORY_CARE_PROVIDER_SITE_OTHER): Payer: Medicare Other | Admitting: Family Medicine

## 2017-05-30 VITALS — BP 100/70 | HR 75 | Temp 98.0°F | Resp 16 | Ht 67.0 in | Wt 163.0 lb

## 2017-05-30 DIAGNOSIS — R2681 Unsteadiness on feet: Secondary | ICD-10-CM | POA: Diagnosis not present

## 2017-05-30 DIAGNOSIS — R296 Repeated falls: Secondary | ICD-10-CM | POA: Diagnosis not present

## 2017-05-30 DIAGNOSIS — F79 Unspecified intellectual disabilities: Secondary | ICD-10-CM

## 2017-05-30 DIAGNOSIS — F320 Major depressive disorder, single episode, mild: Secondary | ICD-10-CM

## 2017-05-30 DIAGNOSIS — Z993 Dependence on wheelchair: Secondary | ICD-10-CM | POA: Diagnosis not present

## 2017-05-30 DIAGNOSIS — K5909 Other constipation: Secondary | ICD-10-CM | POA: Diagnosis not present

## 2017-05-30 DIAGNOSIS — G809 Cerebral palsy, unspecified: Secondary | ICD-10-CM | POA: Diagnosis not present

## 2017-05-30 DIAGNOSIS — I10 Essential (primary) hypertension: Secondary | ICD-10-CM

## 2017-05-30 MED ORDER — LUBIPROSTONE 24 MCG PO CAPS
24.0000 ug | ORAL_CAPSULE | Freq: Two times a day (BID) | ORAL | 5 refills | Status: DC
Start: 2017-05-30 — End: 2017-11-21

## 2017-05-30 MED ORDER — SERTRALINE HCL 100 MG PO TABS: 100.0000 mg | ORAL_TABLET | Freq: Every day | ORAL | 5 refills | Status: DC

## 2017-05-30 MED ORDER — HYDROCHLOROTHIAZIDE 12.5 MG PO TABS: 12.5000 mg | ORAL_TABLET | Freq: Every day | ORAL | 5 refills | Status: DC

## 2017-05-30 NOTE — Progress Notes (Signed)
Name: [name]   MRN: [id number]    DOB: 1940-09-05   Date:05/30/2017       Progress Note  Subjective  Chief Complaint  Chief Complaint  Patient presents with  . Hypertension    Edema in bilateral ankles but wearing compression stockings  . Medication Refill  . Depression    Has been fussying with staff alot.    HPI  HTN: his bp started to go up around June 2018, unknown cause. He seemed to be a little more aggravated with his sister [name]) but he was not agitated at the time. His bp has been towards low end of normal now, he has a history of recurrent falls, we will try stopping HCTZ for now  Mild Depression: he was doing well on Zoloft, no recent crying spells, but getting more snappy with staff lately. Caregiver  ( [name]) states he gets more agitated after he talks to his sister on the phone. We will try adjusting dose of Zoloft to 100 mg and monitor  Bottom pain: he has been telling his sister that his bottom hurts, his stools at the group home are [name] Scale of 2. No blood in stools, he strains during a bowel movements. He has miralax at home but not taking it on a regular basis, we will change to Amitiza  Gait instability/wheelchair bound/frequent falls: had to go to urgent care, wearing a gait belt, needing assistance with transfers. We will refer him back to PT  CP/MR: stable. Lives in a group home, needs assistance with bathing, transferring, dressing, medication management, money management  Patient Active Problem List   Diagnosis Date Noted  . Hypertension 10/26/2016  . Late effect acute polio 05/17/2016  . Chronic constipation 11/19/2015  . Mild major depression ([place]) 11/19/2015  . Complicated grieving 51/05/5850  . Recurrent UTI 09/11/2015  . Adenocarcinoma of prostate ([place]) 03/02/2015  . Amyotrophia 03/02/2015  . Edema leg 03/02/2015  . Cerebral palsy ([place]) 03/02/2015  . Dyslipidemia 03/02/2015  . Dermatitis, eczematoid 03/02/2015  . Divergent  squint 03/02/2015  . H/O acute poliomyelitis 03/02/2015  . Lymphedema 03/02/2015  . Mental retardation 03/02/2015  . Hypertrophy of nail 03/02/2015  . Adiposity 03/02/2015  . Absence of bladder continence 03/02/2015  . Chronic venous insufficiency 03/02/2015  . Avitaminosis D 03/02/2015  . Adynamia 03/02/2015  . Dependent on wheelchair 03/02/2015  . Kidney cysts 03/02/2015  . At risk for falling 03/02/2015  . Cerebral vascular disease 03/02/2015  . Strabismus 02/11/2015  . Urge incontinence of urine 02/11/2015  . Wheelchair dependence 02/11/2015  . Obesity 02/11/2015  . Urinary frequency 10/30/2014    Past Surgical History:  Procedure Laterality Date  . leg circulation surgery Right   . PROSTATE SURGERY  11/10/2008   BRACHYTHERAPY    Family History  Problem Relation Age of Onset  . Heart attack Brother   . Heart disease Unknown     Social History   Socioeconomic History  . Marital status: Single    Spouse name: Not on file  . Number of children: Not on file  . Years of education: Not on file  . Highest education level: Not on file  Social Needs  . Financial resource strain: Not on file  . Food insecurity - worry: Not on file  . Food insecurity - inability: Not on file  . Transportation needs - medical: Not on file  . Transportation needs - non-medical: Not on file  Occupational History  . Not on file  Tobacco Use  . Smoking status: Never Smoker  . Smokeless tobacco: Never Used  Substance and Sexual Activity  . Alcohol use: No    Alcohol/week: 0.0 oz  . Drug use: No  . Sexual activity: No  Other Topics Concern  . Not on file  Social History Narrative   Lives at [facility] group home, 5 males.          Current Outpatient Medications:  .  alfuzosin (UROXATRAL) 10 MG 24 hr tablet, TAKE 1 TABLET (10MG ) BY MOUTH ONCE DAILY WITH BREAKFAST*DO NOT CRUSH*, Disp: 30 tablet, Rfl: 11 .  ALPRAZolam (XANAX) 0.25 MG tablet, TAKE ONE TABLET BY MOUTH 2 TIMES A DAY  AS NEEDED FOR ANXIETY (WHEN BLOOD PRESSURE IS VERY HIGH OR ANXIOUS), Disp: 20 tablet, Rfl: 0 .  aspirin 81 MG tablet, 1 tablet daily, Disp: 30 tablet, Rfl: 0 .  betamethasone dipropionate (DIPROLENE) 0.05 % cream, Apply topically as needed., Disp: 45 g, Rfl: 1 .  Calcium Carb-Cholecalciferol 500-400 MG-UNIT TABS, TAKE ONE TABLET BY MOUTH 2 TIMES A DAY, Disp: 60 tablet, Rfl: 12 .  carboxymethylcellul-glycerin (REFRESH OPTIVE) 0.5-0.9 % ophthalmic solution, Place 2 drops into both eyes 2 (two) times daily as needed for dry eyes., Disp: , Rfl:  .  Cranberry 425 MG CAPS, Take 1 capsule by mouth daily., Disp: 30 capsule, Rfl: PRN .  Elastic Bandages & Supports (MEDICAL COMPRESSION STOCKINGS) MISC, 1 Units by Does not apply route daily., Disp: 2 each, Rfl: 0 .  hydrochlorothiazide (HYDRODIURIL) 12.5 MG tablet, Take 1 tablet (12.5 mg total) by mouth daily., Disp: 30 tablet, Rfl: 5 .  ibuprofen (ADVIL,MOTRIN) 800 MG tablet, Take 1 tablet (800 mg total) by mouth every 8 (eight) hours as needed., Disp: 30 tablet, Rfl: 0 .  Incontinence Supply Disposable (DEPEND SILHOUETTE BRIEFS L/XL) MISC, 1 each by Does not apply route 4 (four) times daily., Disp: 120 each, Rfl: 5 .  Polyethyl Glycol-Propyl Glycol (SYSTANE ULTRA) 0.4-0.3 % SOLN, Apply 1 drop to eye 2 (two) times daily. Both eyes, Disp: , Rfl:  .  polyethylene glycol powder (GLYCOLAX/MIRALAX) powder, Take 17 g by mouth daily as needed., Disp: 3350 g, Rfl: 5 .  Polyvinyl Alcohol-Povidone 5-6 MG/ML SOLN, Apply 2 drops to eye 2 (two) times daily as needed., Disp: 1 Bottle, Rfl: 2 .  sertraline (ZOLOFT) 50 MG tablet, Take 1 tablet (50 mg total) by mouth daily., Disp: 30 tablet, Rfl: 5 .  vitamin C (ASCORBIC ACID) 500 MG tablet, Take 1 tablet (500 mg total) by mouth 2 (two) times daily., Disp: 60 tablet, Rfl: 12  No Known Allergies   ROS  Constitutional: Negative for fever or weight change.  Respiratory: Negative for cough and shortness of breath.    Cardiovascular: Negative for chest pain or palpitations.  Gastrointestinal: Negative for abdominal pain, no bowel changes - he has chronic constipation but has been complaining of " bottom pain"   Musculoskeletal: Positive  for gait problem but no  joint swelling.  Skin: Negative for rash.  Neurological: Negative for dizziness or headache.  No other specific complaints in a complete review of systems (except as listed in HPI above).  Objective  Vitals:   05/30/17 0927  BP: 100/70  Pulse: 75  Resp: 16  Temp: 98 F (36.7 C)  TempSrc: Oral  SpO2: 99%  Weight: 163 lb (73.9 kg)  Height: 5\' 7"  (1.702 m)    Body mass index is 25.53 kg/m.  Physical Exam   Constitutional: Obese No  distress.  HEENT: head atraumatic, normocephalic, strabismus, neck supple, throat within normal limits Cardiovascular: Normal rate, regular rhythm and normal heart sounds. No murmur heard.trace  bilateral ankle edema doing better on compression stocking hoses Pulmonary/Chest: Effort normal and breath sounds normal. No respiratory distress. Abdominal: Soft. There is no tenderness. Negative CVA tenderness, he has two skin tags on right buttocks, he has normal anus, but a lot of hard stools on rectal vault Psychiatric: Patient has a normal mood and affect. Cooperative, friendly  Muscular Skeletal: wheelchair bound, atrophic legs     PHQ2/9: Depression screen [place] 2/9 02/14/2017 02/14/2016 12/23/2015 11/19/2015 08/19/2015  Decreased Interest 0 0 0 0 0  Down, Depressed, Hopeless 0 0 0 0 1  PHQ - 2 Score 0 0 0 0 1     Fall Risk: Fall Risk  05/30/2017 02/14/2017 02/14/2016 12/23/2015 11/19/2015  Falls in the past year? Yes Yes Yes Yes Yes  Number falls in past yr: 2 or more 2 or more 2 or more 2 or more 1  Injury with Fall? Yes No Yes Yes No  Risk Factor Category  High Fall Risk - High Fall Risk High Fall Risk -  Risk for fall due to : Impaired balance/gait;Impaired mobility Impaired  balance/gait;Impaired mobility;History of fall(s) - History of fall(s);Impaired balance/gait;Impaired mobility -     Functional Status Survey: Is the patient deaf or have difficulty hearing?: No Does the patient have difficulty seeing, even when wearing glasses/contacts?: No Does the patient have difficulty concentrating, remembering, or making decisions?: Yes Does the patient have difficulty walking or climbing stairs?: Yes Does the patient have difficulty dressing or bathing?: Yes Does the patient have difficulty doing errands alone such as visiting a doctor's office or shopping?: Yes    Assessment & Plan  1. Cerebral palsy, unspecified type ([place])  stable  2. Dependent on wheelchair  And has recurrent falls, had to go to urgent care and has a note to have help with transfer at the home  3. Gait instability  We will order home PT again   4. Mental retardation  stable  5. Recurrent falls  Referral PT  6. Mild major depression ([place])  Getting more fussy with staff, also sister seems to be  - sertraline (ZOLOFT) 100 MG tablet; Take 1 tablet (100 mg total) by mouth daily.  Dispense: 30 tablet; Refill: 5   7. Hypertension, benign  BP is low we will stop HCTZ  8. Chronic constipation  Caregiver states he is not getting miralax at home we will change to Amitiza, very constipated and has been complaining of rectal pain  - lubiprostone (AMITIZA) 24 MCG capsule; Take 1 capsule (24 mcg total) by mouth 2 (two) times daily with a meal.  Dispense: 60 capsule; Refill: 5

## 2017-07-02 ENCOUNTER — Encounter: Payer: Self-pay | Admitting: Emergency Medicine

## 2017-07-02 ENCOUNTER — Emergency Department
Admission: EM | Admit: 2017-07-02 | Discharge: 2017-07-02 | Disposition: A | Discharge status: Home / Self Care | Payer: Medicare Other | Attending: Emergency Medicine | Admitting: Emergency Medicine

## 2017-07-02 ENCOUNTER — Encounter: Payer: Self-pay | Admitting: Family Medicine

## 2017-07-02 ENCOUNTER — Ambulatory Visit (INDEPENDENT_AMBULATORY_CARE_PROVIDER_SITE_OTHER): Payer: Medicare Other | Admitting: Family Medicine

## 2017-07-02 ENCOUNTER — Emergency Department: Payer: Medicare Other

## 2017-07-02 VITALS — BP 118/82 | HR 67 | Temp 97.5°F | Resp 16

## 2017-07-02 DIAGNOSIS — N2889 Other specified disorders of kidney and ureter: Secondary | ICD-10-CM | POA: Diagnosis not present

## 2017-07-02 DIAGNOSIS — Z7982 Long term (current) use of aspirin: Secondary | ICD-10-CM | POA: Insufficient documentation

## 2017-07-02 DIAGNOSIS — R824 Acetonuria: Secondary | ICD-10-CM | POA: Diagnosis not present

## 2017-07-02 DIAGNOSIS — N3091 Cystitis, unspecified with hematuria: Secondary | ICD-10-CM | POA: Diagnosis not present

## 2017-07-02 DIAGNOSIS — R809 Proteinuria, unspecified: Secondary | ICD-10-CM

## 2017-07-02 DIAGNOSIS — R32 Unspecified urinary incontinence: Secondary | ICD-10-CM

## 2017-07-02 DIAGNOSIS — Z79899 Other long term (current) drug therapy: Secondary | ICD-10-CM | POA: Diagnosis not present

## 2017-07-02 DIAGNOSIS — Z8546 Personal history of malignant neoplasm of prostate: Secondary | ICD-10-CM | POA: Diagnosis not present

## 2017-07-02 DIAGNOSIS — R231 Pallor: Secondary | ICD-10-CM

## 2017-07-02 DIAGNOSIS — F79 Unspecified intellectual disabilities: Secondary | ICD-10-CM | POA: Insufficient documentation

## 2017-07-02 DIAGNOSIS — R310 Gross hematuria: Secondary | ICD-10-CM | POA: Diagnosis not present

## 2017-07-02 DIAGNOSIS — R319 Hematuria, unspecified: Secondary | ICD-10-CM | POA: Diagnosis not present

## 2017-07-02 DIAGNOSIS — G809 Cerebral palsy, unspecified: Secondary | ICD-10-CM | POA: Insufficient documentation

## 2017-07-02 DIAGNOSIS — R630 Anorexia: Secondary | ICD-10-CM

## 2017-07-02 DIAGNOSIS — I10 Essential (primary) hypertension: Secondary | ICD-10-CM | POA: Diagnosis not present

## 2017-07-02 DIAGNOSIS — N3090 Cystitis, unspecified without hematuria: Secondary | ICD-10-CM

## 2017-07-02 DIAGNOSIS — N201 Calculus of ureter: Secondary | ICD-10-CM | POA: Insufficient documentation

## 2017-07-02 DIAGNOSIS — N281 Cyst of kidney, acquired: Secondary | ICD-10-CM | POA: Diagnosis not present

## 2017-07-02 LAB — POCT URINALYSIS DIPSTICK
Bilirubin, UA: NEGATIVE
Blood, UA: POSITIVE
GLUCOSE UA: NEGATIVE
Nitrite, UA: POSITIVE
Protein, UA: POSITIVE
SPEC GRAV UA: 1.015 (ref 1.010–1.025)
Urobilinogen, UA: 0.2 E.U./dL
pH, UA: 7 (ref 5.0–8.0)

## 2017-07-02 LAB — BASIC METABOLIC PANEL
ANION GAP: 10 (ref 5–15)
BUN: 17 mg/dL (ref 6–20)
CALCIUM: 8.7 mg/dL — AB (ref 8.9–10.3)
CO2: 24 mmol/L (ref 22–32)
Chloride: 104 mmol/L (ref 101–111)
Creatinine, Ser: 0.85 mg/dL (ref 0.61–1.24)
GFR calc Af Amer: >60 mL/min (ref >60)
GLUCOSE: 103 mg/dL — AB (ref 65–99)
Potassium: 3.9 mmol/L (ref 3.5–5.1)
Sodium: 138 mmol/L (ref 135–145)

## 2017-07-02 LAB — URINALYSIS, COMPLETE (UACMP) WITH MICROSCOPIC
Bacteria, UA: NONE SEEN
Specific Gravity, Urine: 1.012 (ref 1.005–1.030)
Squamous Epithelial / LPF: NONE SEEN

## 2017-07-02 LAB — CBC
HCT: 42.3 % (ref 40.0–52.0)
Hemoglobin: 14.7 g/dL (ref 13.0–18.0)
MCH: 32.5 pg (ref 26.0–34.0)
MCHC: 34.8 g/dL (ref 32.0–36.0)
MCV: 93.3 fL (ref 80.0–100.0)
PLATELETS: 182 10*3/uL (ref 150–440)
RBC: 4.54 MIL/uL (ref 4.40–5.90)
RDW: 13 % (ref 11.5–14.5)
WBC: 5.5 10*3/uL (ref 3.8–10.6)

## 2017-07-02 LAB — CK: CK TOTAL: 118 U/L (ref 49–397)

## 2017-07-02 MED ORDER — IOPAMIDOL (ISOVUE-300) INJECTION 61%
125.0000 mL | Freq: Once | INTRAVENOUS | Status: AC | PRN
  Administered 2017-07-02: 125 mL via INTRAVENOUS

## 2017-07-02 MED ORDER — CEFDINIR 300 MG PO CAPS: 300.0000 mg | ORAL_CAPSULE | Freq: Two times a day (BID) | ORAL | 0 refills | Status: DC

## 2017-07-02 MED ORDER — CEFTRIAXONE SODIUM 1 G IJ SOLR
1.0000 g | Freq: Once | INTRAMUSCULAR | Status: AC
  Administered 2017-07-02: 1 g via INTRAVENOUS
  Filled 2017-07-02: qty 10

## 2017-07-02 NOTE — ED Provider Notes (Signed)
[facility] Emergency Department Provider Note  ____________________________________________  Time seen: Approximately 8:46 PM  I have reviewed the triage vital signs and the nursing notes.   HISTORY  Chief Complaint Hematuria  Level 5 Caveat: Portions of the History and Physical are unable to be obtained due to patient being a poor historian due to cerebral palsy  HPI [name] is a 77 y.o. male sent to the ED for evaluation of hematuria. This just started today. No fevers chills or pain complaints. No clots in the urine but it is very dark brownish red. No blood thinners. No trauma.     Past Medical History:  Diagnosis Date  . Atrophy, disuse, muscle   . BPH (benign prostatic hyperplasia)   . Cerebral palsy ([place])   . Eczema   . Elevated PSA   . Exotropia   . Hematuria, gross   . Hyperlipidemia   . Incontinence   . Lower urinary tract symptoms   . Lymphedema   . Mental retardation   . Over weight   . Prostate cancer ([place])   . Renal cyst   . Urinary frequency   . Urinary urgency   . Venous insufficiency   . Vitamin D deficiency   . Wheelchair dependence      Patient Active Problem List   Diagnosis Date Noted  . Hypertension 10/26/2016  . Late effect acute polio 05/17/2016  . Chronic constipation 11/19/2015  . Mild major depression ([place]) 11/19/2015  . Complicated grieving 76/72/0947  . Recurrent UTI 09/11/2015  . Adenocarcinoma of prostate ([place]) 03/02/2015  . Amyotrophia 03/02/2015  . Edema leg 03/02/2015  . Cerebral palsy ([place]) 03/02/2015  . Dyslipidemia 03/02/2015  . Dermatitis, eczematoid 03/02/2015  . 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 H/O acute poliomyelitis 03/02/2015  . Lymphedema 03/02/2015  . Mental retardation 03/02/2015  . Hypertrophy of nail 03/02/2015  . Adiposity 03/02/2015  . Absence of bladder continence 03/02/2015  . Chronic venous insufficiency 03/02/2015  . Avitaminosis D 03/02/2015  . Adynamia  03/02/2015  . Dependent on wheelchair 03/02/2015  . Kidney cysts 03/02/2015  . At risk for falling 03/02/2015  . Cerebral vascular disease 03/02/2015  . Strabismus 02/11/2015  . Urge incontinence of urine 02/11/2015  . Wheelchair dependence 02/11/2015  . Obesity 02/11/2015  . Urinary frequency 10/30/2014     Past Surgical History:  Procedure Laterality Date  . leg circulation surgery Right   . PROSTATE SURGERY  11/10/2008   BRACHYTHERAPY     Prior to Admission medications   Medication Sig Start Date End Date Taking? Authorizing Provider  alfuzosin (UROXATRAL) 10 MG 24 hr tablet TAKE 1 TABLET (10MG ) BY MOUTH ONCE DAILY WITH BREAKFAST*DO NOT CRUSH* 11/01/16  Yes Sowles, Drue Stager, MD  aspirin 81 MG tablet 1 tablet daily 07/07/16  Yes Sowles, Drue Stager, MD  Calcium Carb-Cholecalciferol 500-400 MG-UNIT TABS TAKE ONE TABLET BY MOUTH 2 TIMES A DAY 05/05/16  Yes Sowles, Drue Stager, MD  Cranberry 425 MG CAPS Take 1 capsule by mouth daily. 11/17/16  Yes Sowles, Drue Stager, MD  lubiprostone (AMITIZA) 24 MCG capsule Take 1 capsule (24 mcg total) by mouth 2 (two) times daily with a meal. 05/30/17  Yes Sowles, Drue Stager, MD  Polyethyl Glycol-Propyl Glycol (SYSTANE ULTRA) 0.4-0.3 % SOLN Apply 1 drop to eye 2 (two) times daily. Both eyes   Yes [provider]  sertraline (ZOLOFT) 100 MG tablet Take 1 tablet (100 mg total) by mouth daily. 05/30/17  Yes Steele Sizer, MD  vitamin C (ASCORBIC  ACID) 500 MG tablet Take 1 tablet (500 mg total) by mouth 2 (two) times daily. 11/20/16  Yes Sowles, Drue Stager, MD  ALPRAZolam Duanne Moron) 0.25 MG tablet TAKE ONE TABLET BY MOUTH 2 TIMES A DAY AS NEEDED FOR ANXIETY (WHEN BLOOD PRESSURE IS VERY HIGH OR ANXIOUS) 08/08/16   Ancil Boozer, Drue Stager, MD  betamethasone dipropionate (DIPROLENE) 0.05 % cream Apply topically as needed. Patient not taking: Reported on 07/02/2017 10/02/16   Steele Sizer, MD  carboxymethylcellul-glycerin (REFRESH OPTIVE) 0.5-0.9 % ophthalmic solution Place 2 drops  into both eyes 2 (two) times daily as needed for dry eyes.    [provider]  cefdinir (OMNICEF) 300 MG capsule Take 1 capsule (300 mg total) by mouth 2 (two) times daily. 07/02/17   Carrie Mew, MD  Elastic Bandages & Supports (MEDICAL COMPRESSION STOCKINGS) MISC 1 Units by Does not apply route daily. 07/19/15   Steele Sizer, MD  ibuprofen (ADVIL,MOTRIN) 800 MG tablet Take 1 tablet (800 mg total) by mouth every 8 (eight) hours as needed. 08/08/16   Hubbard Hartshorn, FNP  Incontinence Supply Disposable (DEPEND SILHOUETTE BRIEFS L/XL) MISC 1 each by Does not apply route 4 (four) times daily. 10/02/16   Steele Sizer, MD  Polyvinyl Alcohol-Povidone 5-6 MG/ML SOLN Apply 2 drops to eye 2 (two) times daily as needed. 11/21/15   Steele Sizer, MD     Allergies Patient has no known allergies.   Family History  Problem Relation Age of Onset  . Heart attack Brother   . Heart disease Unknown     Social History Social History   Tobacco Use  . Smoking status: Never Smoker  . Smokeless tobacco: Never Used  Substance Use Topics  . Alcohol use: No    Alcohol/week: 0.0 oz  . Drug use: No    Review of Systems unable to reliably obtained due to poor historian ____________________________________________   PHYSICAL EXAM:  VITAL SIGNS: ED Triage Vitals  Enc Vitals Group     BP 07/02/17 1432 (!) 164/91     Pulse Rate 07/02/17 1432 82     Resp 07/02/17 1432 16     Temp 07/02/17 1432 (!) 97.5 F (36.4 C)     Temp Source 07/02/17 1432 Oral     SpO2 07/02/17 1432 98 %     Weight 07/02/17 1430 180 lb (81.6 kg)     Height --      Head Circumference --      Peak Flow --      Pain Score 07/02/17 1429 0     Pain Loc --      Pain Edu? --      Excl. in [place]? --     Vital signs reviewed, nursing assessments reviewed.   Constitutional:  awake and alert, not oriented. Well appearing and in no distress. Eyes:   No scleral icterus.  EOMI. No nystagmus. No conjunctival pallor.  PERRL. ENT   Head:   Normocephalic and atraumatic.   Nose:   No congestion/rhinnorhea.    Mouth/Throat:   MMM, no pharyngeal erythema. No peritonsillar mass.    Neck:   No meningismus. Full ROM. Hematological/Lymphatic/Immunilogical:   No cervical lymphadenopathy. Cardiovascular:   RRR. Symmetric bilateral radial and DP pulses.  No murmurs.  Respiratory:   Normal respiratory effort without tachypnea/retractions. Breath sounds are clear and equal bilaterally. No wheezes/rales/rhonchi. Gastrointestinal:   Soft and nontender. Non distended. There is no CVA tenderness.  No rebound, rigidity, or guarding. Genitourinary:   normal, uncircumcised Musculoskeletal:  Normal range of motion in all extremities. No joint effusions.  No lower extremity tenderness.  No edema. Neurologic:   baseline speech and language.  Motor grossly intact. No acute focal neurologic deficits are appreciated.  Skin:    Skin is warm, dry and intact. No rash noted.  No petechiae, purpura, or bullae.  ____________________________________________    LABS (pertinent positives/negatives) (all labs ordered are listed, but only abnormal results are displayed) Labs Reviewed  URINALYSIS, COMPLETE (UACMP) WITH MICROSCOPIC - Abnormal; Notable for the following components:      Result Value   Color, Urine RED (*)    APPearance CLOUDY (*)    Glucose, UA   (*)    Value: TEST NOT REPORTED DUE TO COLOR INTERFERENCE OF URINE PIGMENT   Hgb urine dipstick   (*)    Value: TEST NOT REPORTED DUE TO COLOR INTERFERENCE OF URINE PIGMENT   Bilirubin Urine   (*)    Value: TEST NOT REPORTED DUE TO COLOR INTERFERENCE OF URINE PIGMENT   Ketones, ur   (*)    Value: TEST NOT REPORTED DUE TO COLOR INTERFERENCE OF URINE PIGMENT   Protein, ur   (*)    Value: TEST NOT REPORTED DUE TO COLOR INTERFERENCE OF URINE PIGMENT   Nitrite   (*)    Value: TEST NOT REPORTED DUE TO COLOR INTERFERENCE OF URINE PIGMENT   Leukocytes, UA   (*)     Value: TEST NOT REPORTED DUE TO COLOR INTERFERENCE OF URINE PIGMENT   All other components within normal limits  BASIC METABOLIC PANEL - Abnormal; Notable for the following components:   Glucose, Bld 103 (*)    Calcium 8.7 (*)    All other components within normal limits  URINE CULTURE  CBC  CK   ____________________________________________   EKG    ____________________________________________    RADIOLOGY  Ct Hematuria Workup  Result Date: 07/02/2017 CLINICAL DATA:  77 year old male with history of gross hematuria. Urinary incontinence. EXAM: CT ABDOMEN AND PELVIS WITHOUT AND WITH CONTRAST TECHNIQUE: Multidetector CT imaging of the abdomen and pelvis was performed following the standard protocol before and following the bolus administration of intravenous contrast. CONTRAST:  161mL ISOVUE-300 IOPAMIDOL (ISOVUE-300) INJECTION 61% COMPARISON:  CT the abdomen and pelvis 06/24/2013. FINDINGS: Lower chest: Atherosclerotic calcifications in the left anterior descending and right coronary artery. Mild calcifications of the aortic valve. Hepatobiliary: No suspicious cystic or solid hepatic lesions. No intra or extrahepatic biliary ductal dilatation. Gallbladder is normal in appearance. Pancreas: No pancreatic mass. No pancreatic ductal dilatation. No pancreatic or peripancreatic fluid or inflammatory changes. Spleen: Unremarkable. Adrenals/Urinary Tract: No calcifications are noted within the collecting system of either kidney. However, along the right ureter at the junction of middle and distal thirds there is an 8 mm calculus (axial image 62 of series 2). Immediately proximal to this there is some high attenuation material (axial image 59 of series 2) which does not appear to enhance, presumably a small amount of thrombus. No proximal hydroureteronephrosis. In the medial aspect of the interpolar region of the right kidney there is a subtle 1.2 x 1.5 cm lesion (axial image 32 of series 12) which is  intermediate attenuation on precontrast images (21 HU), enhances (108 HU on portal venous phase imaging), and demonstrates some washout (69 HU on delayed phase imaging), concerning for potential neoplasm. Several other low-attenuation lesions are noted in both kidneys, compatible with simple cysts, measuring up to 1.8 cm in the interpolar region of the left kidney.  In addition, there is a 1.5 cm lesion in the anterior aspect of the interpolar region of the right kidney (axial image 41 of series 7) which may demonstrate some low-level enhancement (15 HU increasing to 37 HU and decreasing to 14 HU). On postcontrast delayed images, there is a filling defect which corresponds to the suspected thrombus in the right ureter, but there are no other filling defects noted elsewhere in the collecting system of either kidney, along the course of the left ureter or within the well opacified portions of the lumen of the urinary bladder (which is incompletely opacified), to strongly suggest the presence of urothelial neoplasm at this time. Urinary bladder is grossly unremarkable in appearance. Bilateral adrenal glands are normal in appearance. Stomach/Bowel: Normal appearance of the stomach. No pathologic dilatation of small bowel or colon. Normal appendix. Vascular/Lymphatic: Aortic atherosclerosis, without evidence of aneurysm or dissection in the abdominal or pelvic vasculature. No lymphadenopathy noted in the abdomen or pelvis. Reproductive: Brachytherapy implants throughout the prostate gland. Seminal vesicles are unremarkable in appearance. Other: No significant volume of ascites.  No pneumoperitoneum. Musculoskeletal: There are no aggressive appearing lytic or blastic lesions noted in the visualized portions of the skeleton. IMPRESSION: 1. 8 mm nonobstructive calculus in the right ureter at the junction of middle and distal thirds. There is a small amount of adjacent thrombus in the ureter just proximal to the calculus. 2.  In addition, there are 2 indeterminate right renal lesions, as detailed above. One of these appears suspicious for potential neoplasm. Further characterization with nonemergent MRI of the abdomen with and without IV gadolinium is recommended in the near future to better evaluate these findings. 3. Aortic atherosclerosis, in addition to least 2 vessel coronary artery disease. Assessment for potential risk factor modification, dietary therapy or pharmacologic therapy may be warranted, if clinically indicated. 4. There are calcifications of the aortic valve. Echocardiographic correlation for evaluation of potential valvular dysfunction may be warranted if clinically indicated. 5. Additional incidental findings, as above. Electronically Signed   By: Vinnie Langton M.D.   On: 07/02/2017 19:34    ____________________________________________   PROCEDURES Procedures  ____________________________________________    CLINICAL IMPRESSION / ASSESSMENT AND PLAN / ED COURSE  Pertinent labs & imaging results that were available during my care of the patient were reviewed by me and considered in my medical decision making (see chart for details).     Clinical Course as of Jul 02 2044  Mon Jul 02, 2017  1800 Gross hematuria. Outpatient ua looks like uti. Will get CT to eval for obstructing stone vs renal mass  [PS]  1848 Ct appears to show 6-49mm mid rureter stone. With uti, will proceed with contrast enhanced scan and f/u radiology report.   [PS]    Clinical Course User Index [PS] Carrie Mew, MD     ----------------------------------------- 8:48 PM on 07/02/2017 -----------------------------------------  Final radiology report reveals an 8 mm nonobstructing ureteral stone on the right as well as an associated renal mass concerning for malignancy. Urology paged for further recommendations. Creatinine is normal, there is no hydronephrosis, no urinary retention, so I think the patient is  suitable for discharge home on oral antibiotics and urology follow-up for reassessment in 1 week once the infection is cleared. Return precautions discussed for any worsening of condition.  __________________________  ----------------------------------------- 9:09 PM on 07/02/2017 -----------------------------------------  Discussed with urology Dr. Louis Meckel who agrees with outpatient follow-up after treatment of urinary tract infection.  __________________   FINAL CLINICAL IMPRESSION(S) /  ED DIAGNOSES    Final diagnoses:  Hematuria, gross  Ureterolithiasis  Renal mass  Cystitis     ED Discharge Orders        Ordered    cefdinir (OMNICEF) 300 MG capsule  2 times daily     07/02/17 2046      Portions of this note were generated with dragon dictation software. Dictation errors may occur despite best attempts at proofreading.    Carrie Mew, MD 07/02/17 2109

## 2017-07-02 NOTE — ED Notes (Signed)
Pt sent from Dr. Steva Ready office with c/o hematuria. States pt has a hx of cerebral palsy, kidney stones and bladder mass.Mar[place] Kitchen

## 2017-07-02 NOTE — ED Notes (Signed)
Patient transported to CT 

## 2017-07-02 NOTE — ED Triage Notes (Addendum)
Blood in urine this morning.  Seen by Dr. Raelene Bott office and had a urinalysis done.  Hematuria persists.

## 2017-07-02 NOTE — Discharge Instructions (Signed)
Your tests today show a urinary tract infection. Your CT scan shows an 8 mm stone in the right ureter and a mass on the right kidney that is concerning for malignancy. Take antibiotics as prescribed to treat the urinary tract infection. Follow-up with urology for continued evaluation of your symptoms. Return to the emergency room if you have severe unremitting pain, fever, or vomiting.

## 2017-07-02 NOTE — Patient Instructions (Signed)
Please take him now to the emergency department

## 2017-07-02 NOTE — Progress Notes (Signed)
BP 118/82   Pulse 67   Temp (!) 97.5 F (36.4 C) (Oral)   Resp 16   SpO2 96%    Subjective:    Patient ID: [name], male    DOB: February 24, 1941, 77 y.o.   MRN: [id number]  HPI: [name] is a 77 y.o. male  Chief Complaint  Patient presents with  . Hematuria    blood in urine    HPI Patient is here for blood in his urine Staff reports that his urine looks like "pure blood" Patient is a limited historian, with cerebral palsy, lives in a group home Urine done in November 2018 showed no blood on the dip Witnessed by staff He has a few urinary accidents Cranky Not eating as much for the last few days, change in behavior Taking ibuprofen rarely  Last imaging was March 2015: 3 mm nonobstructing interpolar left renal calculus (series 2/ image  33). No hydronephrosis.   Multiple bilateral renal cysts, including:   --16 mm interpolar left renal cyst (series 4/image 29)   --15 mm anterior interpolar right renal cyst (series 4/ image 31),  with layering hemorrhage (series 2/image 32)   --20 mm anterior interpolar right renal cyst (series 4/ image 33)   --15 mm posterior right lower pole renal cyst (series 4/ image 35)   --9 mm lateral left lower pole renal cyst (series 14/ image 37),  with layering hemorrhage (series 2/image 38)   No enhancing renal lesions. These are all compatible with Bosniak  I-II renal cysts, benign.    Depression screen [facility] 2/9 07/02/2017 02/14/2017 02/14/2016 12/23/2015 11/19/2015  Decreased Interest 0 0 0 0 0  Down, Depressed, Hopeless 1 0 0 0 0  PHQ - 2 Score 1 0 0 0 0    Relevant past medical, surgical, family and social history reviewed Past Medical History:  Diagnosis Date  . Atrophy, disuse, muscle   . BPH (benign prostatic hyperplasia)   . Cerebral palsy ([place])   . Eczema   . Elevated PSA   . Exotropia   . Hematuria, gross   . Hyperlipidemia   . Incontinence   . Lower urinary tract symptoms   . Lymphedema     . Mental retardation   . Over weight   . Prostate cancer ([place])   . Renal cyst   . Urinary frequency   . Urinary urgency   . Venous insufficiency   . Vitamin D deficiency   . Wheelchair dependence    Past Surgical History:  Procedure Laterality Date  . leg circulation surgery Right   . PROSTATE SURGERY  11/10/2008   BRACHYTHERAPY   Family History  Problem Relation Age of Onset  . Heart attack Brother   . Heart disease Unknown    Social History   Tobacco Use  . Smoking status: Never Smoker  . Smokeless tobacco: Never Used  Substance Use Topics  . Alcohol use: No    Alcohol/week: 0.0 oz  . Drug use: No    Interim medical history since last visit reviewed. Allergies and medications reviewed  Review of Systems Per HPI unless specifically indicated above     Objective:    BP 118/82   Pulse 67   Temp (!) 97.5 F (36.4 C) (Oral)   Resp 16   SpO2 96%   Wt Readings from Last 3 Encounters:  05/30/17 163 lb (73.9 kg)  02/26/17 163 lb (73.9 kg)  02/14/17 163 lb (73.9 kg)  Physical Exam  Constitutional: He appears well-developed and well-nourished. No distress.  Eyes: No scleral icterus.  Cardiovascular: Normal rate and regular rhythm.  Pulmonary/Chest: Effort normal and breath sounds normal.  Abdominal: There is tenderness (?? patient grunted when left side abdomen palptated as well as suprapubic area palpated).  Neurological: He is alert.  Seated in wheelchair  Skin: He is not diaphoretic. There is pallor.  Nailbed pallor  Psychiatric:  Limited historian due to cerebral palsy; not agitated    Results for orders placed or performed in visit on 07/02/17  POCT urinalysis dipstick  Result Value Ref Range   Color, UA drk brown/bloody    Clarity, UA     Glucose, UA neg    Bilirubin, UA neg    Ketones, UA trace    Spec Grav, UA 1.015 1.010 - 1.025   Blood, UA positive    pH, UA 7.0 5.0 - 8.0   Protein, UA positive    Urobilinogen, UA 0.2 0.2 or 1.0  E.U./dL   Nitrite, UA positive    Leukocytes, UA Small (1+) (A) Negative   Appearance drk brown/bloody    Odor strong       Assessment & Plan:   Problem List Items Addressed This Visit      Other   Absence of bladder continence    More frequent incontinence issues       Other Visit Diagnoses    Hematuria, unspecified type    -  Primary   gross hematuria; hx of renal cysts, kidney stones, ddx also includes hemorrhagic cystitis, but I do feel CT scan and labs today are necessary; to ER   Relevant Orders   POCT urinalysis dipstick (Completed)   Urine Culture   Proteinuria, unspecified type       Ketonuria       with decreased  PO intake the last few days   Decreased appetite       noted by staff in the last few days   Bilateral renal cysts       noted on scan from 2015   Pallor       nailbeds, suspect anemia       Follow up plan: No Follow-up on file.  An after-visit summary was printed and given to the patient at [facility].  Please see the patient instructions which may contain other information and recommendations beyond what is mentioned above in the assessment and plan.  No orders of the defined types were placed in this encounter.   Orders Placed This Encounter  Procedures  . Urine Culture  . POCT urinalysis dipstick    Check out given to ER staff, patient coming by private vehicle

## 2017-07-02 NOTE — ED Notes (Signed)
Pt back from CT

## 2017-07-02 NOTE — Assessment & Plan Note (Signed)
More frequent incontinence issues

## 2017-07-03 ENCOUNTER — Telehealth: Payer: Self-pay | Admitting: Family Medicine

## 2017-07-03 LAB — URINE CULTURE
MICRO NUMBER: 90308089
SPECIMEN QUALITY:: ADEQUATE

## 2017-07-03 NOTE — Telephone Encounter (Signed)
Please see abnormal CT scan from ER visit

## 2017-07-04 ENCOUNTER — Encounter: Payer: Self-pay | Admitting: Family Medicine

## 2017-07-04 ENCOUNTER — Ambulatory Visit (INDEPENDENT_AMBULATORY_CARE_PROVIDER_SITE_OTHER): Payer: Medicare Other | Admitting: Family Medicine

## 2017-07-04 VITALS — BP 124/76 | HR 66 | Temp 97.9°F | Resp 16 | Ht 67.0 in | Wt 163.0 lb

## 2017-07-04 DIAGNOSIS — I739 Peripheral vascular disease, unspecified: Secondary | ICD-10-CM | POA: Insufficient documentation

## 2017-07-04 DIAGNOSIS — I2510 Atherosclerotic heart disease of native coronary artery without angina pectoris: Secondary | ICD-10-CM | POA: Diagnosis not present

## 2017-07-04 DIAGNOSIS — R310 Gross hematuria: Secondary | ICD-10-CM | POA: Diagnosis not present

## 2017-07-04 DIAGNOSIS — N201 Calculus of ureter: Secondary | ICD-10-CM

## 2017-07-04 DIAGNOSIS — N2889 Other specified disorders of kidney and ureter: Secondary | ICD-10-CM | POA: Diagnosis not present

## 2017-07-04 DIAGNOSIS — F419 Anxiety disorder, unspecified: Secondary | ICD-10-CM

## 2017-07-04 DIAGNOSIS — N289 Disorder of kidney and ureter, unspecified: Secondary | ICD-10-CM | POA: Diagnosis not present

## 2017-07-04 DIAGNOSIS — I2584 Coronary atherosclerosis due to calcified coronary lesion: Secondary | ICD-10-CM | POA: Diagnosis not present

## 2017-07-04 DIAGNOSIS — I359 Nonrheumatic aortic valve disorder, unspecified: Secondary | ICD-10-CM

## 2017-07-04 DIAGNOSIS — I700 Atherosclerosis of aorta: Secondary | ICD-10-CM | POA: Diagnosis not present

## 2017-07-04 LAB — URINE CULTURE

## 2017-07-04 MED ORDER — ATORVASTATIN CALCIUM 40 MG PO TABS: 40.0000 mg | ORAL_TABLET | Freq: Every day | ORAL | 1 refills | Status: DC

## 2017-07-04 NOTE — Progress Notes (Signed)
Name: [name]   MRN: [id number]    DOB: Oct 11, 1940   Date:07/04/2017       Progress Note  Subjective  Chief Complaint  Chief Complaint  Patient presents with  . Hematuria    HPI  Guardian is his nephew: Gailen Venne # [phone]  Today he is here with : [name] -he lives at [facility] group home  He has MR, poor historian.  Gross hematuria: he was seen in our office on 07/02/2017 with gross hematuria, he was afebrile, no nausea or vomiting, but since he is a poor historian he was sent to [facility] by Dr. Sanda Klein, and was found to have a kidney mass, thrombus on ureter and treated and right ureter calculus. He was sent home on antibiotics. He is acting his normal self. Caregiver states he may be a little weaker than usual, but normal appetite. He sees Dr. Jacqlyn Larsen, but follow up not until June 2019. I will try to contact guardian and get a sooner appointment with Dr. Jacqlyn Larsen  Atherosclerosis aorta, and coronary arteries with also calcified aortic valve, he does not seem to have chest pain, but discussed medication, Bill seems to want to take a pill for it  Patient Active Problem List   Diagnosis Date Noted  . Hypertension 10/26/2016  . Late effect acute polio 05/17/2016  . Chronic constipation 11/19/2015  . Mild major depression ([place]) 11/19/2015  . Complicated grieving 93/79/0240  . Recurrent UTI 09/11/2015  . Adenocarcinoma of prostate ([place]) 03/02/2015  . Amyotrophia 03/02/2015  . Edema leg 03/02/2015  . Cerebral palsy ([place]) 03/02/2015  . Dyslipidemia 03/02/2015  . Dermatitis, eczematoid 03/02/2015  . 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 H/O acute poliomyelitis 03/02/2015  . Lymphedema 03/02/2015  . Mental retardation 03/02/2015  . Hypertrophy of nail 03/02/2015  . Adiposity 03/02/2015  . Absence of bladder continence 03/02/2015  . Chronic venous insufficiency 03/02/2015  . Avitaminosis D 03/02/2015  . Adynamia 03/02/2015  . Dependent on wheelchair 03/02/2015  . Kidney cysts  03/02/2015  . At risk for falling 03/02/2015  . Cerebral vascular disease 03/02/2015  . Strabismus 02/11/2015  . Urge incontinence of urine 02/11/2015  . Wheelchair dependence 02/11/2015  . Obesity 02/11/2015  . Urinary frequency 10/30/2014    Past Surgical History:  Procedure Laterality Date  . leg circulation surgery Right   . PROSTATE SURGERY  11/10/2008   BRACHYTHERAPY    Family History  Problem Relation Age of Onset  . Heart attack Brother   . Heart disease Unknown     Social History   Socioeconomic History  . Marital status: Single    Spouse name: Not on file  . Number of children: Not on file  . Years of education: Not on file  . Highest education level: Not on file  Social Needs  . Financial resource strain: Not on file  . Food insecurity - worry: Not on file  . Food insecurity - inability: Not on file  . Transportation needs - medical: Not on file  . Transportation needs - non-medical: Not on file  Occupational History  . Not on file  Tobacco Use  . Smoking status: Never Smoker  . Smokeless tobacco: Never Used  Substance and Sexual Activity  . Alcohol use: No    Alcohol/week: 0.0 oz  . Drug use: No  . Sexual activity: No  Other Topics Concern  . Not on file  Social History Narrative   Lives at [facility] group home, 5 males.  Current Outpatient Medications:  .  alfuzosin (UROXATRAL) 10 MG 24 hr tablet, TAKE 1 TABLET (10MG) BY MOUTH ONCE DAILY WITH BREAKFAST*DO NOT CRUSH*, Disp: 30 tablet, Rfl: 11 .  ALPRAZolam (XANAX) 0.25 MG tablet, TAKE ONE TABLET BY MOUTH 2 TIMES A DAY AS NEEDED FOR ANXIETY (WHEN BLOOD PRESSURE IS VERY HIGH OR ANXIOUS), Disp: 20 tablet, Rfl: 0 .  aspirin 81 MG tablet, 1 tablet daily, Disp: 30 tablet, Rfl: 0 .  betamethasone dipropionate (DIPROLENE) 0.05 % cream, Apply topically as needed., Disp: 45 g, Rfl: 1 .  Calcium Carb-Cholecalciferol 500-400 MG-UNIT TABS, TAKE ONE TABLET BY MOUTH 2 TIMES A DAY, Disp: 60 tablet,  Rfl: 12 .  carboxymethylcellul-glycerin (REFRESH OPTIVE) 0.5-0.9 % ophthalmic solution, Place 2 drops into both eyes 2 (two) times daily as needed for dry eyes., Disp: , Rfl:  .  cefdinir (OMNICEF) 300 MG capsule, Take 1 capsule (300 mg total) by mouth 2 (two) times daily., Disp: 14 capsule, Rfl: 0 .  Cranberry 425 MG CAPS, Take 1 capsule by mouth daily., Disp: 30 capsule, Rfl: PRN .  Elastic Bandages & Supports (MEDICAL COMPRESSION STOCKINGS) MISC, 1 Units by Does not apply route daily., Disp: 2 each, Rfl: 0 .  ibuprofen (ADVIL,MOTRIN) 800 MG tablet, Take 1 tablet (800 mg total) by mouth every 8 (eight) hours as needed., Disp: 30 tablet, Rfl: 0 .  Incontinence Supply Disposable (DEPEND SILHOUETTE BRIEFS L/XL) MISC, 1 each by Does not apply route 4 (four) times daily., Disp: 120 each, Rfl: 5 .  lubiprostone (AMITIZA) 24 MCG capsule, Take 1 capsule (24 mcg total) by mouth 2 (two) times daily with a meal., Disp: 60 capsule, Rfl: 5 .  Polyethyl Glycol-Propyl Glycol (SYSTANE ULTRA) 0.4-0.3 % SOLN, Apply 1 drop to eye 2 (two) times daily. Both eyes, Disp: , Rfl:  .  Polyvinyl Alcohol-Povidone 5-6 MG/ML SOLN, Apply 2 drops to eye 2 (two) times daily as needed., Disp: 1 Bottle, Rfl: 2 .  sertraline (ZOLOFT) 100 MG tablet, Take 1 tablet (100 mg total) by mouth daily., Disp: 30 tablet, Rfl: 5 .  vitamin C (ASCORBIC ACID) 500 MG tablet, Take 1 tablet (500 mg total) by mouth 2 (two) times daily., Disp: 60 tablet, Rfl: 12  No Known Allergies   ROS  Constitutional: Negative for fever or weight change.  Respiratory: Negative for cough and shortness of breath.   Cardiovascular: Negative for chest pain or palpitations.  Gastrointestinal: Negative for abdominal pain, no bowel changes.  Musculoskeletal: positive for gait problem but no  joint swelling.  Skin: Negative for rash.  Neurological: Negative for dizziness or headache.  No other specific complaints in a complete review of systems (except as listed in  HPI above).  Objective  Vitals:   07/04/17 1435  BP: 124/76  Pulse: 66  Resp: 16  Temp: 97.9 F (36.6 C)  TempSrc: Oral  SpO2: 96%  Weight: 163 lb (73.9 kg)  Height: 5' 7"  (1.702 m)    Body mass index is 25.53 kg/m.  Physical Exam  Constitutional: Patient appears well-developed and well-nourished. No distress.  HEENT: head atraumatic, normocephalic, pupils equal and reactive to light, neck supple, throat within normal limits Cardiovascular: Normal rate, regular rhythm and normal heart sounds.  No murmur heard. No BLE edema. Pulmonary/Chest: Effort normal and breath sounds normal. No respiratory distress. Abdominal: Soft.  There is no tenderness. No CVA tenderness Psychiatric: Patient has a normal mood and affect. behavior is normal.   Recent Results (from the past 2160 hour(s))  POCT urinalysis dipstick     Status: Abnormal   Collection Time: 07/02/17  1:47 PM  Result Value Ref Range   Color, UA drk brown/bloody    Clarity, UA     Glucose, UA neg    Bilirubin, UA neg    Ketones, UA trace    Spec Grav, UA 1.015 1.010 - 1.025   Blood, UA positive    pH, UA 7.0 5.0 - 8.0   Protein, UA positive    Urobilinogen, UA 0.2 0.2 or 1.0 E.U./dL   Nitrite, UA positive    Leukocytes, UA Small (1+) (A) Negative   Appearance drk brown/bloody    Odor strong   Urine Culture     Status: None   Collection Time: 07/02/17  2:13 PM  Result Value Ref Range   MICRO NUMBER: [id number]    SPECIMEN QUALITY: ADEQUATE    Sample Source URINE    STATUS: FINAL    ISOLATE 1:      Three or more organisms present, each greater than 10,000 cu/mL. May represent normal flora contamination from external genitalia. No further testing is required.  CBC     Status: None   Collection Time: 07/02/17  2:38 PM  Result Value Ref Range   WBC 5.5 3.8 - 10.6 K/uL   RBC 4.54 4.40 - 5.90 MIL/uL   Hemoglobin 14.7 13.0 - 18.0 g/dL   HCT 42.3 40.0 - 52.0 %   MCV 93.3 80.0 - 100.0 fL   MCH 32.5 26.0 - 34.0 pg    MCHC 34.8 32.0 - 36.0 g/dL   RDW 13.0 11.5 - 14.5 %   Platelets 182 150 - 440 K/uL    Comment: Performed at [facility], [address] [place], [place] [address]  Basic metabolic panel     Status: Abnormal   Collection Time: 07/02/17  2:38 PM  Result Value Ref Range   Sodium 138 135 - 145 mmol/L   Potassium 3.9 3.5 - 5.1 mmol/L   Chloride 104 101 - 111 mmol/L   CO2 24 22 - 32 mmol/L   Glucose, Bld 103 (H) 65 - 99 mg/dL   BUN 17 6 - 20 mg/dL   Creatinine, Ser 0.85 0.61 - 1.24 mg/dL   Calcium 8.7 (L) 8.9 - 10.3 mg/dL   GFR calc non Af Amer >60 >60 mL/min   GFR calc Af Amer >60 >60 mL/min    Comment: (NOTE) The eGFR has been calculated using the CKD EPI equation. This calculation has not been validated in all clinical situations. eGFR's persistently <60 mL/min signify possible Chronic Kidney Disease.    Anion gap 10 5 - 15    Comment: Performed at [facility], [address] [place], [place] [address]  CK     Status: None   Collection Time: 07/02/17  2:38 PM  Result Value Ref Range   Total CK 118 49 - 397 U/L    Comment: Performed at [facility], [address] [place], [place] [address]  Urinalysis, Complete w Microscopic     Status: Abnormal   Collection Time: 07/02/17  6:09 PM  Result Value Ref Range   Color, Urine RED (A) YELLOW   APPearance CLOUDY (A) CLEAR   Specific Gravity, Urine 1.012 1.005 - 1.030   pH  5.0 - 8.0    TEST NOT REPORTED DUE TO COLOR INTERFERENCE OF URINE PIGMENT   Glucose, UA (A) NEGATIVE mg/dL    TEST NOT REPORTED DUE TO COLOR  INTERFERENCE OF URINE PIGMENT   Hgb urine dipstick (A) NEGATIVE    TEST NOT REPORTED DUE TO COLOR INTERFERENCE OF URINE PIGMENT   Bilirubin Urine (A) NEGATIVE    TEST NOT REPORTED DUE TO COLOR INTERFERENCE OF URINE PIGMENT   Ketones, ur (A) NEGATIVE mg/dL    TEST NOT REPORTED DUE TO COLOR INTERFERENCE OF URINE PIGMENT   Protein, ur (A) NEGATIVE mg/dL    TEST NOT REPORTED DUE TO COLOR  INTERFERENCE OF URINE PIGMENT   Nitrite (A) NEGATIVE    TEST NOT REPORTED DUE TO COLOR INTERFERENCE OF URINE PIGMENT   Leukocytes, UA (A) NEGATIVE    TEST NOT REPORTED DUE TO COLOR INTERFERENCE OF URINE PIGMENT   RBC / HPF TOO NUMEROUS TO COUNT 0 - 5 RBC/hpf   WBC, UA TOO NUMEROUS TO COUNT 0 - 5 WBC/hpf   Bacteria, UA NONE SEEN NONE SEEN   Squamous Epithelial / LPF NONE SEEN NONE SEEN    Comment: Performed at [facility], [address], [place], [place] [address]  Urine Culture     Status: Abnormal   Collection Time: 07/02/17  6:09 PM  Result Value Ref Range   Specimen Description      URINE, RANDOM Performed at [facility], [address] [place], [place] [address]    Special Requests      NONE Performed at [facility], [address] [place], [place] [address]    Culture MULTIPLE SPECIES PRESENT, SUGGEST RECOLLECTION (A)    Report Status 07/04/2017 FINAL       PHQ2/9: Depression screen [facility] 2/9 07/02/2017 02/14/2017 02/14/2016 12/23/2015 11/19/2015  Decreased Interest 0 0 0 0 0  Down, Depressed, Hopeless 1 0 0 0 0  PHQ - 2 Score 1 0 0 0 0     Fall Risk: Fall Risk  07/02/2017 05/30/2017 02/14/2017 02/14/2016 12/23/2015  Falls in the past year? Yes Yes Yes Yes Yes  Number falls in past yr: 2 or more 2 or more 2 or more 2 or more 2 or more  Injury with Fall? Yes Yes No Yes Yes  Risk Factor Category  High Fall Risk High Fall Risk - High Fall Risk High Fall Risk  Risk for fall due to : - Impaired balance/gait;Impaired mobility Impaired balance/gait;Impaired mobility;History of fall(s) - History of fall(s);Impaired balance/gait;Impaired mobility    Assessment & Plan  1. Gross hematuria  - POCT Urinalysis Dipstick  2. Atherosclerosis of aorta [facility])  He is 77 yo , we will start statin therapy   3. Aortic valve calcification   4. Coronary artery calcification  He does not seem to have chest pain, we will try discussing it with his  guardian  5. Lesion of right native kidney  - MR ABDOMEN W CONTRAST; Future - Ambulatory referral to Urology  6. Other specified disorders of kidney and ureter  - MR ABDOMEN W CONTRAST; Future - Ambulatory referral to Urology  7. Ureteral calculus, right  - Ambulatory referral to Urology

## 2017-07-05 MED ORDER — ASPIRIN 81 MG PO TABS: ORAL_TABLET | ORAL | 0 refills | Status: DC

## 2017-07-05 MED ORDER — ALPRAZOLAM 0.25 MG PO TABS: ORAL_TABLET | ORAL | 0 refills | Status: DC

## 2017-07-10 NOTE — Telephone Encounter (Signed)
Patient was seen on 07/04/17 I'm signing off on this note

## 2017-07-16 ENCOUNTER — Other Ambulatory Visit: Payer: Self-pay

## 2017-07-16 ENCOUNTER — Telehealth: Payer: Self-pay

## 2017-07-16 DIAGNOSIS — N289 Disorder of kidney and ureter, unspecified: Secondary | ICD-10-CM

## 2017-07-16 DIAGNOSIS — N2889 Other specified disorders of kidney and ureter: Secondary | ICD-10-CM

## 2017-07-16 NOTE — Telephone Encounter (Signed)
Correct order has been placed.  

## 2017-07-16 NOTE — Telephone Encounter (Signed)
-----   Message from Steele Sizer, MD sent at 07/16/2017  3:37 PM EDT ----- Regarding: please change. Thank you   ----- Message ----- From: Valli Glance Sent: 07/16/2017   1:19 PM To: Steele Sizer, MD  You put in for for pt to have MR Abdomen W order needs to be Abdomen w/wo... Please And we can get pt schedule. Thank You

## 2017-07-19 ENCOUNTER — Encounter: Payer: Self-pay | Admitting: Urology

## 2017-07-19 ENCOUNTER — Ambulatory Visit (INDEPENDENT_AMBULATORY_CARE_PROVIDER_SITE_OTHER): Payer: Medicare Other | Admitting: Urology

## 2017-07-19 VITALS — BP 146/86 | HR 73 | Ht 67.0 in | Wt 160.0 lb

## 2017-07-19 DIAGNOSIS — N201 Calculus of ureter: Secondary | ICD-10-CM

## 2017-07-19 DIAGNOSIS — N2889 Other specified disorders of kidney and ureter: Secondary | ICD-10-CM | POA: Diagnosis not present

## 2017-07-19 DIAGNOSIS — N281 Cyst of kidney, acquired: Secondary | ICD-10-CM | POA: Diagnosis not present

## 2017-07-19 NOTE — H&P (View-Only) (Signed)
07/19/2017 1:54 PM   [name] [birth year] [id number]  Referring provider: Steele Sizer, MD [address] [place], [place] [address]  Chief Complaint  Patient presents with  . Nephrolithiasis    HPI: [name] is a 77 year old male with mental disabilities seen in consultation at the request of Dr. Ancil Boozer for evaluation of a ureteral calculus and renal masses.  He has been followed here for prostate cancer and recurrent UTIs.  He was seen by Dr. Ancil Boozer on 07/02/2017 for total gross painless hematuria.  He was sent to the ER for further evaluation.  His urine was nitrite positive.  A hematuria CT was performed which showed 2 indeterminate right renal lesions measuring 15 mm and 18 mm with questionable enhancement.  A renal mass protocol MRI was recommended and has been ordered by Dr. Ancil Boozer.  He was also noted to have a nonobstructing 8 mm calculus at the junction of the mid and distal ureter.  He denies flank or abdominal pain.  His hematuria has resolved.  There did look like clot just proximal to the calculus.  There were no filling defects suspicious for urothelial carcinoma.  His urine culture grew multiple species.   PMH: Past Medical History:  Diagnosis Date  . Atrophy, disuse, muscle   . BPH (benign prostatic hyperplasia)   . Cerebral palsy ([place])   . Eczema   . Elevated PSA   . Exotropia   . Hematuria, gross   . Hyperlipidemia   . Incontinence   . Lower urinary tract symptoms   . Lymphedema   . Mental retardation   . Over weight   . Prostate cancer ([place])   . Renal cyst   . Urinary frequency   . Urinary urgency   . Venous insufficiency   . Vitamin D deficiency   . Wheelchair dependence     Surgical History: Past Surgical History:  Procedure Laterality Date  . leg circulation surgery Right   . PROSTATE SURGERY  11/10/2008   BRACHYTHERAPY    Home Medications:  Allergies as of 07/19/2017   No Known Allergies     Medication List         Accurate as of 07/19/17  1:54 PM. Always use your most recent med list.          alfuzosin 10 MG 24 hr tablet Commonly known as:  UROXATRAL TAKE 1 TABLET (10MG ) BY MOUTH ONCE DAILY WITH BREAKFAST*DO NOT CRUSH*   ALPRAZolam 0.25 MG tablet Commonly known as:  XANAX TAKE ONE TABLET BY MOUTH 2 TIMES A DAY AS NEEDED FOR ANXIETY (WHEN BLOOD PRESSURE IS VERY HIGH OR ANXIOUS)   aspirin 81 MG tablet 1 tablet daily   atorvastatin 40 MG tablet Commonly known as:  LIPITOR Take 1 tablet (40 mg total) by mouth daily.   betamethasone dipropionate 0.05 % cream Commonly known as:  DIPROLENE Apply topically as needed.   Calcium Carb-Cholecalciferol 500-400 MG-UNIT Tabs TAKE ONE TABLET BY MOUTH 2 TIMES A DAY   carboxymethylcellul-glycerin 0.5-0.9 % ophthalmic solution Commonly known as:  REFRESH OPTIVE Place 2 drops into both eyes 2 (two) times daily as needed for dry eyes.   Cranberry 425 MG Caps Take 1 capsule by mouth daily.   DEPEND SILHOUETTE BRIEFS L/XL Misc 1 each by Does not apply route 4 (four) times daily.   ibuprofen 800 MG tablet Commonly known as:  ADVIL,MOTRIN Take 1 tablet (800 mg total) by mouth every 8 (eight) hours as needed.   lubiprostone  24 MCG capsule Commonly known as:  AMITIZA Take 1 capsule (24 mcg total) by mouth 2 (two) times daily with a meal.   Medical Compression Stockings Misc 1 Units by Does not apply route daily.   Polyvinyl Alcohol-Povidone 5-6 MG/ML Soln Apply 2 drops to eye 2 (two) times daily as needed.   sertraline 100 MG tablet Commonly known as:  ZOLOFT Take 1 tablet (100 mg total) by mouth daily.   SYSTANE ULTRA 0.4-0.3 % Soln Generic drug:  Polyethyl Glycol-Propyl Glycol Apply 1 drop to eye 2 (two) times daily. Both eyes   vitamin C 500 MG tablet Commonly known as:  ASCORBIC ACID Take 1 tablet (500 mg total) by mouth 2 (two) times daily.       Allergies: No Known Allergies  Family History: Family History  Problem  Relation Age of Onset  . Heart attack Brother   . Heart disease Unknown     Social History:  reports that he has never smoked. He has never used smokeless tobacco. He reports that he does not drink alcohol or use drugs.  ROS: UROLOGY Frequent Urination?: No Hard to postpone urination?: No Burning/pain with urination?: No Get up at night to urinate?: No Leakage of urine?: No Urine stream starts and stops?: No Trouble starting stream?: No Do you have to strain to urinate?: No Blood in urine?: Yes Urinary tract infection?: Yes Sexually transmitted disease?: No Injury to kidneys or bladder?: No Painful intercourse?: No Weak stream?: No Erection problems?: No Penile pain?: No  Gastrointestinal Nausea?: No Vomiting?: No Indigestion/heartburn?: No Diarrhea?: No Constipation?: No  Constitutional Fever: No Night sweats?: No Weight loss?: No Fatigue?: No  Skin Skin rash/lesions?: No Itching?: No  Eyes Blurred vision?: No Double vision?: No  Ears/Nose/Throat Sore throat?: No Sinus problems?: No  Hematologic/Lymphatic Swollen glands?: No Easy bruising?: No  Cardiovascular Leg swelling?: No Chest pain?: No  Respiratory Cough?: No Shortness of breath?: No  Endocrine Excessive thirst?: No  Musculoskeletal Back pain?: No Joint pain?: No  Neurological Headaches?: No Dizziness?: No  Psychologic Depression?: No Anxiety?: No  Physical Exam: BP (!) 146/86   Pulse 73   Ht 5\' 7"  (1.702 m)   Wt 160 lb (72.6 kg)   BMI 25.06 kg/m    Constitutional:  Alert and oriented, No acute distress. HEENT: [place] AT, moist mucus membranes.  Trachea midline, no masses. Cardiovascular: No clubbing, cyanosis, or edema.  RRR Respiratory: Normal respiratory effort, no increased work of breathing.  Lungs clear GI: Abdomen is soft, nontender, nondistended, no abdominal masses GU: No CVA tenderness Lymph: No cervical or inguinal lymphadenopathy. Skin: No rashes, bruises or  suspicious lesions. Neurologic: Grossly intact, no focal deficits, moving all 4 extremities. Psychiatric: Normal mood and affect.  Laboratory Data: Lab Results  Component Value Date   WBC 5.5 07/02/2017   HGB 14.7 07/02/2017   HCT 42.3 07/02/2017   MCV 93.3 07/02/2017   PLT 182 07/02/2017    Lab Results  Component Value Date   CREATININE 0.85 07/02/2017    Lab Results  Component Value Date   HGBA1C 4.7 10/02/2016    Pertinent Imaging: CT personally reviewed   Assessment & Plan:    77 year old male with indeterminate right renal lesions and MRI is pending.    He has a nonobstructing 8 mm right ureteral calculus.  Due to the location of the stone I would recommend treatment and feel the best option would be ureteroscopy with laser lithotripsy.  The procedure was discussed in  detail including potential risks of bleeding, infection and ureteral injury.  The possibility of ureteral stricture was discussed.  His guardian was not present today for the visit.  He was accompanied by his sister and personnel from [employer].  They requested to be contacted for scheduling of ureteroscopy rather than doing today.    Abbie Sons, [facility] [place] [address] [place], [place] [address] [phone]

## 2017-07-19 NOTE — Progress Notes (Signed)
07/19/2017 1:54 PM   [name] [birth year] [id number]  Referring provider: Steele Sizer, MD [address] [place], [place] [address]  Chief Complaint  Patient presents with  . Nephrolithiasis    HPI: [name] is a 77 year old male with mental disabilities seen in consultation at the request of Dr. Ancil Boozer for evaluation of a ureteral calculus and renal masses.  He has been followed here for prostate cancer and recurrent UTIs.  He was seen by Dr. Ancil Boozer on 07/02/2017 for total gross painless hematuria.  He was sent to the ER for further evaluation.  His urine was nitrite positive.  A hematuria CT was performed which showed 2 indeterminate right renal lesions measuring 15 mm and 18 mm with questionable enhancement.  A renal mass protocol MRI was recommended and has been ordered by Dr. Ancil Boozer.  He was also noted to have a nonobstructing 8 mm calculus at the junction of the mid and distal ureter.  He denies flank or abdominal pain.  His hematuria has resolved.  There did look like clot just proximal to the calculus.  There were no filling defects suspicious for urothelial carcinoma.  His urine culture grew multiple species.   PMH: Past Medical History:  Diagnosis Date  . Atrophy, disuse, muscle   . BPH (benign prostatic hyperplasia)   . Cerebral palsy ([place])   . Eczema   . Elevated PSA   . Exotropia   . Hematuria, gross   . Hyperlipidemia   . Incontinence   . Lower urinary tract symptoms   . Lymphedema   . Mental retardation   . Over weight   . Prostate cancer ([place])   . Renal cyst   . Urinary frequency   . Urinary urgency   . Venous insufficiency   . Vitamin D deficiency   . Wheelchair dependence     Surgical History: Past Surgical History:  Procedure Laterality Date  . leg circulation surgery Right   . PROSTATE SURGERY  11/10/2008   BRACHYTHERAPY    Home Medications:  Allergies as of 07/19/2017   No Known Allergies     Medication List         Accurate as of 07/19/17  1:54 PM. Always use your most recent med list.          alfuzosin 10 MG 24 hr tablet Commonly known as:  UROXATRAL TAKE 1 TABLET (10MG ) BY MOUTH ONCE DAILY WITH BREAKFAST*DO NOT CRUSH*   ALPRAZolam 0.25 MG tablet Commonly known as:  XANAX TAKE ONE TABLET BY MOUTH 2 TIMES A DAY AS NEEDED FOR ANXIETY (WHEN BLOOD PRESSURE IS VERY HIGH OR ANXIOUS)   aspirin 81 MG tablet 1 tablet daily   atorvastatin 40 MG tablet Commonly known as:  LIPITOR Take 1 tablet (40 mg total) by mouth daily.   betamethasone dipropionate 0.05 % cream Commonly known as:  DIPROLENE Apply topically as needed.   Calcium Carb-Cholecalciferol 500-400 MG-UNIT Tabs TAKE ONE TABLET BY MOUTH 2 TIMES A DAY   carboxymethylcellul-glycerin 0.5-0.9 % ophthalmic solution Commonly known as:  REFRESH OPTIVE Place 2 drops into both eyes 2 (two) times daily as needed for dry eyes.   Cranberry 425 MG Caps Take 1 capsule by mouth daily.   DEPEND SILHOUETTE BRIEFS L/XL Misc 1 each by Does not apply route 4 (four) times daily.   ibuprofen 800 MG tablet Commonly known as:  ADVIL,MOTRIN Take 1 tablet (800 mg total) by mouth every 8 (eight) hours as needed.   lubiprostone  24 MCG capsule Commonly known as:  AMITIZA Take 1 capsule (24 mcg total) by mouth 2 (two) times daily with a meal.   Medical Compression Stockings Misc 1 Units by Does not apply route daily.   Polyvinyl Alcohol-Povidone 5-6 MG/ML Soln Apply 2 drops to eye 2 (two) times daily as needed.   sertraline 100 MG tablet Commonly known as:  ZOLOFT Take 1 tablet (100 mg total) by mouth daily.   SYSTANE ULTRA 0.4-0.3 % Soln Generic drug:  Polyethyl Glycol-Propyl Glycol Apply 1 drop to eye 2 (two) times daily. Both eyes   vitamin C 500 MG tablet Commonly known as:  ASCORBIC ACID Take 1 tablet (500 mg total) by mouth 2 (two) times daily.       Allergies: No Known Allergies  Family History: Family History  Problem  Relation Age of Onset  . Heart attack Brother   . Heart disease Unknown     Social History:  reports that he has never smoked. He has never used smokeless tobacco. He reports that he does not drink alcohol or use drugs.  ROS: UROLOGY Frequent Urination?: No Hard to postpone urination?: No Burning/pain with urination?: No Get up at night to urinate?: No Leakage of urine?: No Urine stream starts and stops?: No Trouble starting stream?: No Do you have to strain to urinate?: No Blood in urine?: Yes Urinary tract infection?: Yes Sexually transmitted disease?: No Injury to kidneys or bladder?: No Painful intercourse?: No Weak stream?: No Erection problems?: No Penile pain?: No  Gastrointestinal Nausea?: No Vomiting?: No Indigestion/heartburn?: No Diarrhea?: No Constipation?: No  Constitutional Fever: No Night sweats?: No Weight loss?: No Fatigue?: No  Skin Skin rash/lesions?: No Itching?: No  Eyes Blurred vision?: No Double vision?: No  Ears/Nose/Throat Sore throat?: No Sinus problems?: No  Hematologic/Lymphatic Swollen glands?: No Easy bruising?: No  Cardiovascular Leg swelling?: No Chest pain?: No  Respiratory Cough?: No Shortness of breath?: No  Endocrine Excessive thirst?: No  Musculoskeletal Back pain?: No Joint pain?: No  Neurological Headaches?: No Dizziness?: No  Psychologic Depression?: No Anxiety?: No  Physical Exam: BP (!) 146/86   Pulse 73   Ht 5\' 7"  (1.702 m)   Wt 160 lb (72.6 kg)   BMI 25.06 kg/m    Constitutional:  Alert and oriented, No acute distress. HEENT: [place] AT, moist mucus membranes.  Trachea midline, no masses. Cardiovascular: No clubbing, cyanosis, or edema.  RRR Respiratory: Normal respiratory effort, no increased work of breathing.  Lungs clear GI: Abdomen is soft, nontender, nondistended, no abdominal masses GU: No CVA tenderness Lymph: No cervical or inguinal lymphadenopathy. Skin: No rashes, bruises or  suspicious lesions. Neurologic: Grossly intact, no focal deficits, moving all 4 extremities. Psychiatric: Normal mood and affect.  Laboratory Data: Lab Results  Component Value Date   WBC 5.5 07/02/2017   HGB 14.7 07/02/2017   HCT 42.3 07/02/2017   MCV 93.3 07/02/2017   PLT 182 07/02/2017    Lab Results  Component Value Date   CREATININE 0.85 07/02/2017    Lab Results  Component Value Date   HGBA1C 4.7 10/02/2016    Pertinent Imaging: CT personally reviewed   Assessment & Plan:    76 year old male with indeterminate right renal lesions and MRI is pending.    He has a nonobstructing 8 mm right ureteral calculus.  Due to the location of the stone I would recommend treatment and feel the best option would be ureteroscopy with laser lithotripsy.  The procedure was discussed in  detail including potential risks of bleeding, infection and ureteral injury.  The possibility of ureteral stricture was discussed.  His guardian was not present today for the visit.  He was accompanied by his sister and personnel from [employer].  They requested to be contacted for scheduling of ureteroscopy rather than doing today.    Abbie Sons, [place] [address] [place], [place] [address] [phone]

## 2017-07-23 ENCOUNTER — Encounter: Payer: Self-pay | Admitting: Urology

## 2017-07-23 DIAGNOSIS — N2889 Other specified disorders of kidney and ureter: Secondary | ICD-10-CM | POA: Insufficient documentation

## 2017-07-23 DIAGNOSIS — N201 Calculus of ureter: Secondary | ICD-10-CM | POA: Insufficient documentation

## 2017-07-26 ENCOUNTER — Other Ambulatory Visit: Payer: Self-pay | Admitting: Radiology

## 2017-07-26 ENCOUNTER — Ambulatory Visit (INDEPENDENT_AMBULATORY_CARE_PROVIDER_SITE_OTHER): Payer: Medicare Other

## 2017-07-26 ENCOUNTER — Telehealth: Payer: Self-pay | Admitting: Radiology

## 2017-07-26 VITALS — BP 135/87 | HR 82

## 2017-07-26 DIAGNOSIS — R310 Gross hematuria: Secondary | ICD-10-CM

## 2017-07-26 NOTE — Progress Notes (Signed)
Patient present today with caregiver for a follow up after UTI , still having gross hematuria.

## 2017-07-26 NOTE — Telephone Encounter (Signed)
Pt c/o weakness & hematuria. Per [name] from [facility] these are the typical signs of a UTI from this pt. Nurse visit scheduled for ua & ucx.

## 2017-07-26 NOTE — Telephone Encounter (Signed)
Alcora from [employer] called to schedule surgery. Will need orders please.

## 2017-07-27 ENCOUNTER — Other Ambulatory Visit: Payer: Self-pay | Admitting: Family Medicine

## 2017-07-27 ENCOUNTER — Other Ambulatory Visit: Payer: Self-pay | Admitting: Radiology

## 2017-07-27 LAB — MICROSCOPIC EXAMINATION
EPITHELIAL CELLS (NON RENAL): NONE SEEN /HPF (ref 0–10)
WBC, UA: NONE SEEN /hpf (ref 0–5)

## 2017-07-27 LAB — URINALYSIS, COMPLETE
Bilirubin, UA: NEGATIVE
Glucose, UA: NEGATIVE
LEUKOCYTES UA: NEGATIVE
NITRITE UA: NEGATIVE
Specific Gravity, UA: 1.025 (ref 1.005–1.030)
Urobilinogen, Ur: 1 mg/dL (ref 0.2–1.0)
pH, UA: 5.5 (ref 5.0–7.5)

## 2017-07-27 NOTE — Telephone Encounter (Signed)
Kidney function is good; will refill for now. This is not a good medication for long-term use. Next refill will need to be in office by Dr. Ancil Boozer

## 2017-07-28 LAB — CULTURE, URINE COMPREHENSIVE

## 2017-07-30 ENCOUNTER — Telehealth: Payer: Self-pay

## 2017-07-30 ENCOUNTER — Other Ambulatory Visit: Payer: Self-pay | Admitting: Radiology

## 2017-07-30 DIAGNOSIS — N201 Calculus of ureter: Secondary | ICD-10-CM

## 2017-07-30 NOTE — Telephone Encounter (Signed)
Letter sent.

## 2017-07-30 NOTE — Telephone Encounter (Signed)
LMOM to notify [name], pt caregiver, of negative urine culture.

## 2017-07-30 NOTE — Telephone Encounter (Signed)
-----   Message from Abbie Sons, MD sent at 07/29/2017 11:04 AM EDT ----- Urine culture was negative for infection

## 2017-07-30 NOTE — Telephone Encounter (Signed)
Urine culture was negative.  Orders are on your desk.

## 2017-07-31 ENCOUNTER — Ambulatory Visit: Payer: Medicare Other | Admitting: Nurse Practitioner

## 2017-07-31 NOTE — Telephone Encounter (Signed)
LMOM to notify [name], pt caregiver of pre-admit testing appt.

## 2017-08-06 ENCOUNTER — Ambulatory Visit
Admission: RE | Admit: 2017-08-06 | Discharge: 2017-08-06 | Disposition: A | Discharge status: Home / Self Care | Payer: Medicare Other | Source: Ambulatory Visit | Attending: Family Medicine | Admitting: Family Medicine

## 2017-08-06 DIAGNOSIS — N289 Disorder of kidney and ureter, unspecified: Secondary | ICD-10-CM | POA: Insufficient documentation

## 2017-08-06 DIAGNOSIS — N2889 Other specified disorders of kidney and ureter: Secondary | ICD-10-CM

## 2017-08-06 DIAGNOSIS — N1330 Unspecified hydronephrosis: Secondary | ICD-10-CM | POA: Diagnosis not present

## 2017-08-06 MED ORDER — GADOBENATE DIMEGLUMINE 529 MG/ML IV SOLN
15.0000 mL | Freq: Once | INTRAVENOUS | Status: AC | PRN
  Administered 2017-08-06: 15 mL via INTRAVENOUS

## 2017-08-07 ENCOUNTER — Other Ambulatory Visit: Payer: Self-pay

## 2017-08-07 ENCOUNTER — Encounter
Admission: RE | Admit: 2017-08-07 | Discharge: 2017-08-07 | Disposition: A | Discharge status: Home / Self Care | Payer: Medicare Other | Source: Ambulatory Visit | Attending: Urology | Admitting: Urology

## 2017-08-07 DIAGNOSIS — E785 Hyperlipidemia, unspecified: Secondary | ICD-10-CM | POA: Diagnosis not present

## 2017-08-07 DIAGNOSIS — R9431 Abnormal electrocardiogram [ECG] [EKG]: Secondary | ICD-10-CM | POA: Diagnosis not present

## 2017-08-07 DIAGNOSIS — Z01810 Encounter for preprocedural cardiovascular examination: Secondary | ICD-10-CM | POA: Insufficient documentation

## 2017-08-07 HISTORY — DX: Hyperlipidemia, unspecified: E78.5

## 2017-08-07 HISTORY — DX: Localized edema: R60.0

## 2017-08-07 HISTORY — DX: Anxiety disorder, unspecified: F41.9

## 2017-08-07 HISTORY — DX: Calculus of kidney: N20.0

## 2017-08-07 HISTORY — DX: Urinary tract infection, site not specified: N39.0

## 2017-08-07 NOTE — Pre-Procedure Instructions (Signed)
EKG COMPARED WITH 2017 

## 2017-08-07 NOTE — Patient Instructions (Addendum)
Your procedure is scheduled on: 08/14/17 Tues Report to Same Day Surgery 2nd floor medical mall [facility] Entrance-take elevator on left to 2nd floor.  Check in with surgery information desk.) To find out your arrival time please call [phone] between 1PM - 3PM on 08/13/17 Mon  Remember: Instructions that are not followed completely may result in serious medical risk, up to and including death, or upon the discretion of your surgeon and anesthesiologist your surgery may need to be rescheduled.    _x___ 1. Do not eat food after midnight the night before your procedure. You may drink clear liquids up to 2 hours before you are scheduled to arrive at the hospital for your procedure.  Do not drink clear liquids within 2 hours of your scheduled arrival to the hospital.  Clear liquids include  --Water or Apple juice without pulp  --Clear carbohydrate beverage such as ClearFast or Gatorade  --Black Coffee or Clear Tea (No milk, no creamers, do not add anything to                  the coffee or Tea Type 1 and type 2 diabetics should only drink water.  No gum chewing or hard candies.     __x__ 2. No Alcohol for 24 hours before or after surgery.   __x__3. No Smoking or e-cigarettes for 24 prior to surgery.  Do not use any chewable tobacco products for at least 6 hour prior to surgery   ____  4. Bring all medications with you on the day of surgery if instructed.    __x__ 5. Notify your doctor if there is any change in your medical condition     (cold, fever, infections).    x___6. On the morning of surgery brush your teeth with toothpaste and water.  You may rinse your mouth with mouth wash if you wish.  Do not swallow any toothpaste or mouthwash.   Do not wear jewelry, make-up, hairpins, clips or nail polish.  Do not wear lotions, powders, or perfumes. You may wear deodorant.  Do not shave 48 hours prior to surgery. Men may shave face and neck.  Do not bring valuables to the hospital.     [facility] is not responsible for any belongings or valuables.               Contacts, dentures or bridgework may not be worn into surgery.  Leave your suitcase in the car. After surgery it may be brought to your room.  For patients admitted to the hospital, discharge time is determined by your                       treatment team.  _  Patients discharged the day of surgery will not be allowed to drive home.  You will need someone to drive you home and stay with you the night of your procedure.    Please read over the following fact sheets that you were given:   [facility] Preparing for Surgery and or MRSA Information   _x___ Take anti-hypertensive listed below, cardiac, seizure, asthma,     anti-reflux and psychiatric medicines. These include:  1. Zoloft  2.  3.  4.  5.  6.  ____Fleets enema or Magnesium Citrate as directed.   _x___ Use CHG Soap or sage wipes as directed on instruction sheet   ____ Use inhalers on the day of surgery and bring to hospital day of surgery  ____ Stop Metformin and Janumet 2 days prior to surgery.    ____ Take 1/2 of usual insulin dose the night before surgery and none on the morning     surgery.   _x___ Follow recommendations from Cardiologist, Pulmonologist or PCP regarding          stopping Aspirin, Coumadin, Plavix ,Eliquis, Effient, or Pradaxa, and Pletal. To stop Aspirin today per physician order  X____Stop Anti-inflammatories such as Advil, Aleve, Ibuprofen, Motrin, Naproxen, Naprosyn, Goodies powders or aspirin products. OK to take Tylenol and                          Celebrex.   _x___ Stop supplements until after surgery.  But may continue Vitamin D, Vitamin B,       and multivitamin.   ____ Bring C-Pap to the hospital.

## 2017-08-13 MED ORDER — CEFAZOLIN SODIUM-DEXTROSE 2-4 GM/100ML-% IV SOLN
2.0000 g | INTRAVENOUS | Status: AC
  Administered 2017-08-14: 2 g via INTRAVENOUS
  Filled 2017-08-13: qty 100

## 2017-08-14 ENCOUNTER — Ambulatory Visit
Admission: RE | Admit: 2017-08-14 | Discharge: 2017-08-14 | Disposition: A | Discharge status: Skilled Nursing Facility | Payer: Medicare Other | Source: Ambulatory Visit | Attending: Urology | Admitting: Urology

## 2017-08-14 ENCOUNTER — Ambulatory Visit: Payer: Medicare Other | Admitting: Certified Registered"

## 2017-08-14 ENCOUNTER — Encounter: Payer: Self-pay | Admitting: *Deleted

## 2017-08-14 ENCOUNTER — Encounter
Admission: RE | Disposition: A | Discharge status: Skilled Nursing Facility | Payer: Self-pay | Source: Ambulatory Visit | Attending: Urology

## 2017-08-14 ENCOUNTER — Other Ambulatory Visit: Payer: Self-pay

## 2017-08-14 DIAGNOSIS — Z87440 Personal history of urinary (tract) infections: Secondary | ICD-10-CM | POA: Insufficient documentation

## 2017-08-14 DIAGNOSIS — Z7982 Long term (current) use of aspirin: Secondary | ICD-10-CM | POA: Insufficient documentation

## 2017-08-14 DIAGNOSIS — N281 Cyst of kidney, acquired: Secondary | ICD-10-CM | POA: Insufficient documentation

## 2017-08-14 DIAGNOSIS — F79 Unspecified intellectual disabilities: Secondary | ICD-10-CM | POA: Insufficient documentation

## 2017-08-14 DIAGNOSIS — F419 Anxiety disorder, unspecified: Secondary | ICD-10-CM | POA: Diagnosis not present

## 2017-08-14 DIAGNOSIS — G809 Cerebral palsy, unspecified: Secondary | ICD-10-CM | POA: Insufficient documentation

## 2017-08-14 DIAGNOSIS — I2510 Atherosclerotic heart disease of native coronary artery without angina pectoris: Secondary | ICD-10-CM | POA: Diagnosis not present

## 2017-08-14 DIAGNOSIS — Z8249 Family history of ischemic heart disease and other diseases of the circulatory system: Secondary | ICD-10-CM | POA: Insufficient documentation

## 2017-08-14 DIAGNOSIS — Z993 Dependence on wheelchair: Secondary | ICD-10-CM | POA: Diagnosis not present

## 2017-08-14 DIAGNOSIS — N201 Calculus of ureter: Secondary | ICD-10-CM | POA: Diagnosis not present

## 2017-08-14 DIAGNOSIS — H04123 Dry eye syndrome of bilateral lacrimal glands: Secondary | ICD-10-CM | POA: Diagnosis not present

## 2017-08-14 DIAGNOSIS — I10 Essential (primary) hypertension: Secondary | ICD-10-CM | POA: Insufficient documentation

## 2017-08-14 DIAGNOSIS — I872 Venous insufficiency (chronic) (peripheral): Secondary | ICD-10-CM | POA: Insufficient documentation

## 2017-08-14 DIAGNOSIS — F329 Major depressive disorder, single episode, unspecified: Secondary | ICD-10-CM | POA: Diagnosis not present

## 2017-08-14 DIAGNOSIS — Z8546 Personal history of malignant neoplasm of prostate: Secondary | ICD-10-CM | POA: Diagnosis not present

## 2017-08-14 DIAGNOSIS — I739 Peripheral vascular disease, unspecified: Secondary | ICD-10-CM | POA: Diagnosis not present

## 2017-08-14 DIAGNOSIS — E559 Vitamin D deficiency, unspecified: Secondary | ICD-10-CM | POA: Insufficient documentation

## 2017-08-14 DIAGNOSIS — E785 Hyperlipidemia, unspecified: Secondary | ICD-10-CM | POA: Insufficient documentation

## 2017-08-14 DIAGNOSIS — Z79899 Other long term (current) drug therapy: Secondary | ICD-10-CM | POA: Insufficient documentation

## 2017-08-14 HISTORY — PX: CYSTOSCOPY/URETEROSCOPY/HOLMIUM LASER/STENT PLACEMENT: SHX6546

## 2017-08-14 SURGERY — CYSTOSCOPY/URETEROSCOPY/HOLMIUM LASER/STENT PLACEMENT
Anesthesia: General | Site: Ureter | Laterality: Right | Wound class: Clean Contaminated

## 2017-08-14 MED ORDER — SEVOFLURANE IN SOLN
RESPIRATORY_TRACT | Status: AC
  Filled 2017-08-14: qty 250

## 2017-08-14 MED ORDER — LACTATED RINGERS IV SOLN
INTRAVENOUS | Status: DC
  Administered 2017-08-14: 08:00:00 via INTRAVENOUS

## 2017-08-14 MED ORDER — FENTANYL CITRATE (PF) 100 MCG/2ML IJ SOLN
INTRAMUSCULAR | Status: AC
  Filled 2017-08-14: qty 2

## 2017-08-14 MED ORDER — IOTHALAMATE MEGLUMINE 43 % IV SOLN
INTRAVENOUS | Status: DC | PRN
  Administered 2017-08-14: 25 mL

## 2017-08-14 MED ORDER — LIDOCAINE HCL (CARDIAC) PF 100 MG/5ML IV SOSY
PREFILLED_SYRINGE | INTRAVENOUS | Status: DC | PRN
  Administered 2017-08-14: 100 mg via INTRATRACHEAL

## 2017-08-14 MED ORDER — ONDANSETRON HCL 4 MG/2ML IJ SOLN: 4.0000 mg | Freq: Once | INTRAMUSCULAR | Status: DC | PRN

## 2017-08-14 MED ORDER — CEFAZOLIN SODIUM-DEXTROSE 2-3 GM-%(50ML) IV SOLR
INTRAVENOUS | Status: AC
  Filled 2017-08-14: qty 50

## 2017-08-14 MED ORDER — PROPOFOL 500 MG/50ML IV EMUL
INTRAVENOUS | Status: DC | PRN
  Administered 2017-08-14: 100 ug via INTRAVENOUS
  Administered 2017-08-14: 130 ug via INTRAVENOUS

## 2017-08-14 MED ORDER — CEFUROXIME AXETIL 500 MG PO TABS: 500.0000 mg | ORAL_TABLET | Freq: Two times a day (BID) | ORAL | 0 refills | Status: AC

## 2017-08-14 MED ORDER — FENTANYL CITRATE (PF) 100 MCG/2ML IJ SOLN
INTRAMUSCULAR | Status: DC | PRN
  Administered 2017-08-14: 100 ug via INTRAVENOUS

## 2017-08-14 MED ORDER — EPHEDRINE SULFATE 50 MG/ML IJ SOLN
INTRAMUSCULAR | Status: DC | PRN
  Administered 2017-08-14 (×3): 5 mg via INTRAVENOUS
  Administered 2017-08-14: 10 mg via INTRAVENOUS

## 2017-08-14 MED ORDER — FAMOTIDINE 20 MG PO TABS: 20.0000 mg | ORAL_TABLET | Freq: Once | ORAL | Status: DC

## 2017-08-14 MED ORDER — PROPOFOL 500 MG/50ML IV EMUL
INTRAVENOUS | Status: AC
  Filled 2017-08-14: qty 50

## 2017-08-14 MED ORDER — FAMOTIDINE 20 MG PO TABS
ORAL_TABLET | ORAL | Status: AC
  Administered 2017-08-14: 20 mg
  Filled 2017-08-14: qty 1

## 2017-08-14 MED ORDER — TRAMADOL HCL 50 MG PO TABS: 50.0000 mg | ORAL_TABLET | Freq: Four times a day (QID) | ORAL | 0 refills | Status: DC | PRN

## 2017-08-14 MED ORDER — FENTANYL CITRATE (PF) 100 MCG/2ML IJ SOLN: 25.0000 ug | INTRAMUSCULAR | Status: DC | PRN

## 2017-08-14 SURGICAL SUPPLY — 28 items
BAG DRAIN CYSTO-URO LG1000N (MISCELLANEOUS) ×3 IMPLANT
BASKET ZERO TIP 1.9FR (BASKET) ×3 IMPLANT
BRUSH SCRUB EZ 1% IODOPHOR (MISCELLANEOUS) ×3 IMPLANT
CATH URETL 5X70 OPEN END (CATHETERS) ×3 IMPLANT
CNTNR SPEC 2.5X3XGRAD LEK (MISCELLANEOUS) ×1
CONRAY 43 FOR UROLOGY 50M (MISCELLANEOUS) ×3 IMPLANT
CONT SPEC 4OZ STER OR WHT (MISCELLANEOUS) ×2
CONTAINER SPEC 2.5X3XGRAD LEK (MISCELLANEOUS) ×1 IMPLANT
DRAPE UTILITY 15X26 TOWEL STRL (DRAPES) ×3 IMPLANT
FIBER LASER LITHO 273 (Laser) ×3 IMPLANT
GLOVE BIO SURGEON STRL SZ8 (GLOVE) ×3 IMPLANT
GOWN STRL REUS W/ TWL LRG LVL3 (GOWN DISPOSABLE) ×2 IMPLANT
GOWN STRL REUS W/TWL LRG LVL3 (GOWN DISPOSABLE) ×4
GUIDEWIRE GREEN .038 145CM (MISCELLANEOUS) IMPLANT
INFUSOR MANOMETER BAG 3000ML (MISCELLANEOUS) ×3 IMPLANT
INTRODUCER DILATOR DOUBLE (INTRODUCER) IMPLANT
KIT TURNOVER CYSTO (KITS) ×3 IMPLANT
PACK CYSTO AR (MISCELLANEOUS) ×3 IMPLANT
SENSORWIRE 0.038 NOT ANGLED (WIRE) ×6
SET CYSTO W/LG BORE CLAMP LF (SET/KITS/TRAYS/PACK) ×3 IMPLANT
SHEATH URETERAL 12FRX35CM (MISCELLANEOUS) IMPLANT
SOL .9 NS 3000ML IRR  AL (IV SOLUTION) ×2
SOL .9 NS 3000ML IRR UROMATIC (IV SOLUTION) ×1 IMPLANT
STENT URET 6FRX24 CONTOUR (STENTS) ×3 IMPLANT
STENT URET 6FRX26 CONTOUR (STENTS) IMPLANT
SURGILUBE 2OZ TUBE FLIPTOP (MISCELLANEOUS) ×3 IMPLANT
WATER STERILE IRR 1000ML POUR (IV SOLUTION) ×3 IMPLANT
WIRE SENSOR 0.038 NOT ANGLED (WIRE) ×2 IMPLANT

## 2017-08-14 NOTE — Anesthesia Preprocedure Evaluation (Signed)
Anesthesia Evaluation  Patient identified by MRN, date of birth, ID band Patient awake    Reviewed: Allergy & Precautions, H&P , NPO status , Patient's Chart, lab work & pertinent test results, reviewed documented beta blocker date and time   History of Anesthesia Complications Negative for: history of anesthetic complications  Airway Mallampati: III  TM Distance: >3 FB Neck ROM: full    Dental  (+) Dental Advidsory Given   Pulmonary neg pulmonary ROS,           Cardiovascular Exercise Tolerance: Good hypertension, (-) angina+ CAD and + Peripheral Vascular Disease  (-) Past MI, (-) Cardiac Stents and (-) CABG (-) dysrhythmias (-) Valvular Problems/Murmurs     Neuro/Psych neg Seizures PSYCHIATRIC DISORDERS Anxiety Depression Cerebral palsy and mental retardation  Neuromuscular disease    GI/Hepatic negative GI ROS, Neg liver ROS,   Endo/Other  negative endocrine ROS  Renal/GU Renal disease (kidney stones and cysts)  negative genitourinary   Musculoskeletal   Abdominal   Peds  Hematology negative hematology ROS (+)   Anesthesia Other Findings Past Medical History: No date: Anxiety No date: Atrophy, disuse, muscle No date: BPH (benign prostatic hyperplasia) No date: Cerebral palsy ([facility]) No date: Eczema No date: Elevated lipids No date: Elevated PSA No date: Exotropia No date: Hematuria, gross No date: Hyperlipidemia No date: Incontinence No date: Kidney stone No date: Lower extremity edema     Comment:  lymph drainage problems No date: Lower urinary tract symptoms No date: Lymphedema No date: Mental retardation No date: Over weight No date: Prostate cancer ([place]) No date: Urinary frequency No date: Urinary urgency No date: UTI (urinary tract infection) No date: Venous insufficiency No date: Vitamin D deficiency No date: Wheelchair dependence   Reproductive/Obstetrics negative OB ROS                              Anesthesia Physical Anesthesia Plan  ASA: III  Anesthesia Plan: General   Post-op Pain Management:    Induction: Intravenous  PONV Risk Score and Plan: 2 and Ondansetron and Dexamethasone  Airway Management Planned: LMA and Oral ETT  Additional Equipment:   Intra-op Plan:   Post-operative Plan: Extubation in OR  Informed Consent: I have reviewed the patients History and Physical, chart, labs and discussed the procedure including the risks, benefits and alternatives for the proposed anesthesia with the patient or authorized representative who has indicated his/her understanding and acceptance.   Dental Advisory Given  Plan Discussed with: Anesthesiologist, CRNA and Surgeon  Anesthesia Plan Comments:         Anesthesia Quick Evaluation

## 2017-08-14 NOTE — Discharge Instructions (Signed)

## 2017-08-14 NOTE — Transfer of Care (Signed)
Immediate Anesthesia Transfer of Care Note  Patient: [name]  Procedure(s) Performed: CYSTOSCOPY/URETEROSCOPY/HOLMIUM LASER/STENT PLACEMENT (Right Ureter)  Patient Location: PACU  Anesthesia Type:General  Level of Consciousness: drowsy  Airway & Oxygen Therapy: Patient connected to nasal cannula oxygen  Post-op Assessment: Report given to RN and Post -op Vital signs reviewed and stable  Post vital signs: Reviewed and stable  Last Vitals:  Vitals Value Taken Time  BP 92/66 08/14/2017  9:26 AM  Temp    Pulse 75 08/14/2017  9:27 AM  Resp 19 08/14/2017  9:27 AM  SpO2 95 % 08/14/2017  9:27 AM  Vitals shown include unvalidated device data.  Last Pain:  Vitals:   08/14/17 0630  TempSrc: Oral         Complications: No apparent anesthesia complications

## 2017-08-14 NOTE — Interval H&P Note (Signed)
History and Physical Interval Note:  08/14/2017 7:12 AM  [name]  has presented today for surgery, with the diagnosis of right ureteral calculus  The various methods of treatment have been discussed with the patient and family. After consideration of risks, benefits and other options for treatment, the patient has consented to  Procedure(s): CYSTOSCOPY/URETEROSCOPY/HOLMIUM LASER/STENT PLACEMENT (Right) as a surgical intervention .  [name] history has been reviewed, patient examined, no change in status, stable for surgery.  I have reviewed the patient's chart and labs.  Questions were answered to the patient's satisfaction.     [place]

## 2017-08-14 NOTE — Op Note (Signed)
Preoperative diagnosis: Right mid ureteral calculus  Postoperative diagnosis: Right distal ureteral calculus  Procedure:  1. Cystoscopy 2. Right ureteroscopy and stone removal 3. Ureteroscopic laser lithotripsy 4. Right ureteral stent placement (6 FR) 24 cm 5. Right retrograde pyelography with interpretation  Surgeon: Nicki Reaper C. Sariya Trickey, M.D.  Anesthesia: General  Complications: None  Intraoperative findings:  1.  Right retrograde pyelography post procedure showed no filling defects, stone fragments or contrast extravasation  EBL: Minimal  Specimens: 1. Calculus fragments for analysis   Indication: [name] is a 77 y.o. year old patient with a history of hematuria and was found to have a 7 mm right mid ureteral calculus on CT.  After reviewing the management options for treatment, it was elected elected to proceed with the above surgical procedure(s). We have discussed the potential benefits and risks of the procedure, side effects of the proposed treatment, the likelihood of the patient achieving the goals of the procedure, and any potential problems that might occur during the procedure or recuperation. Informed consent has been obtained.  Description of procedure:  The patient was taken to the operating room and general anesthesia was induced.  The patient was placed in the dorsal lithotomy position, prepped and draped in the usual sterile fashion, and preoperative antibiotics were administered. A preoperative time-out was performed.   A 22 French cystoscope was lubricated and passed under direct vision.  The urethra was normal in caliber without stricture.  The prostate demonstrated mild lateral lobe enlargement and mild bladder neck elevation .  Panendoscopy was performed and the bladder mucosa showed no erythema, solid or papillary lesions.  Attention was directed to the right ureteral orifice and a 0.038 Sensor wire was then advanced up the right ureter into the renal  pelvis under fluoroscopic guidance.  A 4.5 Fr semirigid ureteroscope was then advanced into the ureter next to the guidewire and the calculus was identified in the distal ureter.   The stone was then fragmented with the 273 micron holmium laser fiber on a setting of 0.8 J and frequency of 8 hz.   All stones were then removed from the ureter with a zero tip nitinol basket.  Reinspection of the ureter revealed no remaining visible stones or fragments.   Retrograde pyelogram was performed with findings as described above.  The wire was then backloaded through the cystoscope and a ureteral stent was advance over the wire using Seldinger technique. A 6 FR/ 24 cm stent was was placed under fluoroscopic guidance.  The wire was then removed with an adequate stent curl noted in the renal pelvis as well as in the bladder.  The bladder was then emptied and the procedure ended.  The patient appeared to tolerate the procedure well and without complications.  After anesthetic reversal the patient was transported to the PACU in stable condition.

## 2017-08-14 NOTE — Anesthesia Post-op Follow-up Note (Signed)
Anesthesia QCDR form completed.        

## 2017-08-14 NOTE — Anesthesia Procedure Notes (Signed)
Procedure Name: LMA Insertion Date/Time: 08/14/2017 8:26 AM Performed by: Patience Musca., CRNA Pre-anesthesia Checklist: Patient identified, Patient being monitored, Timeout performed, Emergency Drugs available and Suction available Patient Re-evaluated:Patient Re-evaluated prior to induction Oxygen Delivery Method: Circle system utilized Preoxygenation: Pre-oxygenation with 100% oxygen Induction Type: IV induction Ventilation: Mask ventilation without difficulty LMA: LMA inserted Tube type: Oral Number of attempts: 1 Placement Confirmation: positive ETCO2 and breath sounds checked- equal and bilateral Tube secured with: Tape Dental Injury: Teeth and Oropharynx as per pre-operative assessment

## 2017-08-15 ENCOUNTER — Telehealth: Payer: Self-pay | Admitting: Urology

## 2017-08-15 ENCOUNTER — Encounter: Payer: Self-pay | Admitting: Urology

## 2017-08-15 NOTE — Telephone Encounter (Signed)
The only place I had for this was the original app Amy schedule which was 09-12-17 so I added it there.   Sharyn Lull

## 2017-08-15 NOTE — Telephone Encounter (Signed)
-----   Message from Abbie Sons, MD sent at 08/15/2017 12:52 PM EDT ----- Schedule cystoscopy with stent removal in 2-4 weeks

## 2017-08-16 ENCOUNTER — Ambulatory Visit: Payer: Medicare Other

## 2017-08-16 NOTE — Telephone Encounter (Signed)
Thanks

## 2017-08-16 NOTE — Anesthesia Postprocedure Evaluation (Signed)
Anesthesia Post Note  Patient: [name]  Procedure(s) Performed: CYSTOSCOPY/URETEROSCOPY/HOLMIUM LASER/STENT PLACEMENT (Right Ureter)  Patient location during evaluation: PACU Anesthesia Type: General Level of consciousness: awake and alert Pain management: pain level controlled Vital Signs Assessment: post-procedure vital signs reviewed and stable Respiratory status: spontaneous breathing, nonlabored ventilation, respiratory function stable and patient connected to nasal cannula oxygen Cardiovascular status: blood pressure returned to baseline and stable Postop Assessment: no apparent nausea or vomiting Anesthetic complications: no     Last Vitals:  Vitals:   08/14/17 1013 08/14/17 1055  BP: 129/73 139/76  Pulse: 77 86  Resp: 18 18  Temp: (!) 36 C   SpO2: 97% 98%    Last Pain:  Vitals:   08/14/17 1055  TempSrc:   PainSc: 0-No pain                 Precious Haws Piscitello

## 2017-08-20 LAB — STONE ANALYSIS
Ca Oxalate,Dihydrate: 30 %
Ca Oxalate,Monohydr.: 60 %
Ca phos cry stone ql IR: 10 %
STONE WEIGHT KSTONE: 10 mg

## 2017-08-29 ENCOUNTER — Ambulatory Visit (INDEPENDENT_AMBULATORY_CARE_PROVIDER_SITE_OTHER): Payer: Medicare Other | Admitting: Family Medicine

## 2017-08-29 ENCOUNTER — Encounter: Payer: Self-pay | Admitting: Family Medicine

## 2017-08-29 VITALS — BP 110/70 | HR 57 | Resp 16 | Ht 67.0 in | Wt 190.0 lb

## 2017-08-29 DIAGNOSIS — N200 Calculus of kidney: Secondary | ICD-10-CM

## 2017-08-29 DIAGNOSIS — C61 Malignant neoplasm of prostate: Secondary | ICD-10-CM

## 2017-08-29 DIAGNOSIS — F320 Major depressive disorder, single episode, mild: Secondary | ICD-10-CM | POA: Diagnosis not present

## 2017-08-29 NOTE — Progress Notes (Signed)
Name: [name]   MRN: [id number]    DOB: 06-05-1940   Date:08/29/2017       Progress Note  Subjective  Chief Complaint  Chief Complaint  Patient presents with  . Depression  . Nephrolithiasis    HPI  Depression: he is taking Zoloft daily, cooperative, caregiver [name]) states he seemed worried because of multiple doctor's visit, but seems to be getting back to baseline. No crying spells, he gets moody at times. He is not violent.   Kidney stone: seeing Dr. Bernardo Heater and had a cystoscopy, right ureteroscopy and stone removal and laser lithotripsy wit stent placement. He still has mild hematuria, but no clots and does not seem to have a fever.   Prostate cancer: had brachytherapy in 2010 , was seeing Dr. Jacqlyn Larsen, currently under the care of Dr. Bernardo Heater, advised them to discuss with him on his next visit about monitoring.    Patient Active Problem List   Diagnosis Date Noted  . Renal mass 07/23/2017  . Ureteral calculus 07/23/2017  . Atherosclerosis of aorta ([place]) 07/04/2017  . Aortic valve calcification 07/04/2017  . Coronary artery calcification 07/04/2017  . Hypertension 10/26/2016  . Late effect acute polio 05/17/2016  . Chronic constipation 11/19/2015  . Mild major depression ([place]) 11/19/2015  . Complicated grieving 75/64/3329  . Recurrent UTI 09/11/2015  . Adenocarcinoma of prostate ([place]) 03/02/2015  . Amyotrophia 03/02/2015  . Edema leg 03/02/2015  . Cerebral palsy ([place]) 03/02/2015  . Dyslipidemia 03/02/2015  . Dermatitis, eczematoid 03/02/2015  . 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 H/O acute poliomyelitis 03/02/2015  . Lymphedema 03/02/2015  . Mental retardation 03/02/2015  . Hypertrophy of nail 03/02/2015  . Adiposity 03/02/2015  . Absence of bladder continence 03/02/2015  . Chronic venous insufficiency 03/02/2015  . Avitaminosis D 03/02/2015  . Adynamia 03/02/2015  . Dependent on wheelchair 03/02/2015  . Kidney cysts 03/02/2015  . At risk for falling  03/02/2015  . Cerebral vascular disease 03/02/2015  . Strabismus 02/11/2015  . Urge incontinence of urine 02/11/2015  . Wheelchair dependence 02/11/2015  . Obesity 02/11/2015  . Urinary frequency 10/30/2014    Past Surgical History:  Procedure Laterality Date  . CYSTOSCOPY/URETEROSCOPY/HOLMIUM LASER/STENT PLACEMENT Right 08/14/2017   Procedure: CYSTOSCOPY/URETEROSCOPY/HOLMIUM LASER/STENT PLACEMENT;  Surgeon: Abbie Sons, MD;  Location: [facility] ORS;  Service: Urology;  Laterality: Right;  . leg circulation surgery Right   . PROSTATE SURGERY  11/10/2008   BRACHYTHERAPY    Family History  Problem Relation Age of Onset  . Heart attack Brother   . Heart disease Unknown     Social History   Socioeconomic History  . Marital status: Single    Spouse name: Not on file  . Number of children: Not on file  . Years of education: Not on file  . Highest education level: Not on file  Occupational History  . Not on file  Social Needs  . Financial resource strain: Not on file  . Food insecurity:    Worry: Not on file    Inability: Not on file  . Transportation needs:    Medical: Not on file    Non-medical: Not on file  Tobacco Use  . Smoking status: Never Smoker  . Smokeless tobacco: Never Used  Substance and Sexual Activity  . Alcohol use: No    Alcohol/week: 0.0 oz  . Drug use: No  . Sexual activity: Never  Lifestyle  . Physical activity:    Days per week: Not on file  Minutes per session: Not on file  . Stress: Not on file  Relationships  . Social connections:    Talks on phone: Not on file    Gets together: Not on file    Attends religious service: Not on file    Active member of club or organization: Not on file    Attends meetings of clubs or organizations: Not on file    Relationship status: Not on file  . Intimate partner violence:    Fear of current or ex partner: Not on file    Emotionally abused: Not on file    Physically abused: Not on file    Forced  sexual activity: Not on file  Other Topics Concern  . Not on file  Social History Narrative   Lives at [facility] group home, 5 males.          Current Outpatient Medications:  .  alfuzosin (UROXATRAL) 10 MG 24 hr tablet, TAKE 1 TABLET (10MG) BY MOUTH ONCE DAILY WITH BREAKFAST*DO NOT CRUSH*, Disp: 30 tablet, Rfl: 11 .  ALPRAZolam (XANAX) 0.25 MG tablet, TAKE ONE TABLET BY MOUTH 2 TIMES A DAY AS NEEDED FOR ANXIETY (WHEN BLOOD PRESSURE IS VERY HIGH OR ANXIOUS), Disp: 20 tablet, Rfl: 0 .  ARTIFICIAL TEAR SOLUTION OP, Apply 2 drops to eye 2 (two) times daily as needed (dry eyes)., Disp: , Rfl:  .  aspirin 81 MG tablet, 1 tablet daily, Disp: 30 tablet, Rfl: 0 .  atorvastatin (LIPITOR) 40 MG tablet, Take 1 tablet (40 mg total) by mouth daily. (Patient taking differently: Take 40 mg by mouth every evening. ), Disp: 90 tablet, Rfl: 1 .  betamethasone dipropionate (DIPROLENE) 0.05 % cream, Apply topically as needed. (Patient taking differently: Apply 1 application topically as needed (rash). ), Disp: 45 g, Rfl: 1 .  Calcium Carb-Cholecalciferol 500-400 MG-UNIT TABS, TAKE ONE TABLET BY MOUTH 2 TIMES A DAY, Disp: 60 tablet, Rfl: 12 .  Cranberry 425 MG CAPS, Take 1 capsule by mouth daily., Disp: 30 capsule, Rfl: PRN .  Elastic Bandages & Supports (MEDICAL COMPRESSION STOCKINGS) MISC, 1 Units by Does not apply route daily., Disp: 2 each, Rfl: 0 .  ibuprofen (ADVIL,MOTRIN) 800 MG tablet, TAKE 1 TABLET BY MOUTH EVERY 8 HOURS AS NEEDED. (Patient taking differently: TAKE 1 TABLET BY MOUTH EVERY 8 HOURS AS NEEDED FOR PAIN), Disp: 30 tablet, Rfl: 0 .  Incontinence Supply Disposable (DEPEND SILHOUETTE BRIEFS L/XL) MISC, 1 each by Does not apply route 4 (four) times daily., Disp: 120 each, Rfl: 5 .  lubiprostone (AMITIZA) 24 MCG capsule, Take 1 capsule (24 mcg total) by mouth 2 (two) times daily with a meal., Disp: 60 capsule, Rfl: 5 .  Polyethyl Glycol-Propyl Glycol (SYSTANE ULTRA) 0.4-0.3 % SOLN, Apply 1 drop  to eye 2 (two) times daily. Both eyes, Disp: , Rfl:  .  sertraline (ZOLOFT) 100 MG tablet, Take 1 tablet (100 mg total) by mouth daily., Disp: 30 tablet, Rfl: 5 .  traMADol (ULTRAM) 50 MG tablet, Take 1 tablet (50 mg total) by mouth every 6 (six) hours as needed for moderate pain., Disp: 15 tablet, Rfl: 0 .  vitamin C (ASCORBIC ACID) 500 MG tablet, Take 1 tablet (500 mg total) by mouth 2 (two) times daily., Disp: 60 tablet, Rfl: 12  No Known Allergies   ROS  Unable to give, poor historian, but caregiver states still has some blood in urine, appetite slightly decrease , no change in bowel movements  Objective  Vitals:  08/29/17 1035  BP: 110/70  Pulse: (!) 57  Resp: 16  SpO2: 97%  Weight: 190 lb (86.2 kg)  Height: _0  (1.702 m)    Body mass index is 29.76 kg/m.  Physical Exam  Constitutional: Patient appears well-developed and well-nourished. Overweight.  No distress.  HEENT: head atraumatic, normocephalic, pupils equal and reactive to light,  neck supple, throat within normal limits Cardiovascular: Normal rate, regular rhythm and normal heart sounds.  No murmur heard. Trace  BLE edema, doing well on compression stocking hoses. Pulmonary/Chest: Effort normal and breath sounds normal. No respiratory distress. Abdominal: Soft.  There is no tenderness. Psychiatric: Patient has a normal mood and affect.He is quiet, cooperative   Recent Results (from the past 2160 hour(s))  POCT urinalysis dipstick     Status: Abnormal   Collection Time: 07/02/17  1:47 PM  Result Value Ref Range   Color, UA drk brown/bloody    Clarity, UA     Glucose, UA neg    Bilirubin, UA neg    Ketones, UA trace    Spec Grav, UA 1.015 1.010 - 1.025   Blood, UA positive    pH, UA 7.0 5.0 - 8.0   Protein, UA positive    Urobilinogen, UA 0.2 0.2 or 1.0 E.U./dL   Nitrite, UA positive    Leukocytes, UA Small (1+) (A) Negative   Appearance drk brown/bloody    Odor strong   Urine Culture     Status:  None   Collection Time: 07/02/17  2:13 PM  Result Value Ref Range   MICRO NUMBER: [id number]    SPECIMEN QUALITY: ADEQUATE    Sample Source URINE    STATUS: FINAL    ISOLATE 1:      Three or more organisms present, each greater than 10,000 cu/mL. May represent normal flora contamination from external genitalia. No further testing is required.  CBC     Status: None   Collection Time: 07/02/17  2:38 PM  Result Value Ref Range   WBC 5.5 3.8 - 10.6 K/uL   RBC 4.54 4.40 - 5.90 MIL/uL   Hemoglobin 14.7 13.0 - 18.0 g/dL   HCT 42.3 40.0 - 52.0 %   MCV 93.3 80.0 - 100.0 fL   MCH 32.5 26.0 - 34.0 pg   MCHC 34.8 32.0 - 36.0 g/dL   RDW 13.0 11.5 - 14.5 %   Platelets 182 150 - 440 K/uL    Comment: Performed at [facility], [address] [place], [place] [address]  Basic metabolic panel     Status: Abnormal   Collection Time: 07/02/17  2:38 PM  Result Value Ref Range   Sodium 138 135 - 145 mmol/L   Potassium 3.9 3.5 - 5.1 mmol/L   Chloride 104 101 - 111 mmol/L   CO2 24 22 - 32 mmol/L   Glucose, Bld 103 (H) 65 - 99 mg/dL   BUN 17 6 - 20 mg/dL   Creatinine, Ser 0.85 0.61 - 1.24 mg/dL   Calcium 8.7 (L) 8.9 - 10.3 mg/dL   GFR calc non Af Amer >60 >60 mL/min   GFR calc Af Amer >60 >60 mL/min    Comment: (NOTE) The eGFR has been calculated using the CKD EPI equation. This calculation has not been validated in all clinical situations. eGFR's persistently <60 mL/min signify possible Chronic Kidney Disease.    Anion gap 10 5 - 15    Comment: Performed at [facility], [place], [place]  [address]  CK     Status: None   Collection Time: 07/02/17  2:38 PM  Result Value Ref Range   Total CK 118 49 - 397 U/L    Comment: Performed at [facility], [address] [place], [place] [address]  Urinalysis, Complete w Microscopic     Status: Abnormal   Collection Time: 07/02/17  6:09 PM  Result Value Ref Range   Color, Urine RED (A) YELLOW   APPearance  CLOUDY (A) CLEAR   Specific Gravity, Urine 1.012 1.005 - 1.030   pH  5.0 - 8.0    TEST NOT REPORTED DUE TO COLOR INTERFERENCE OF URINE PIGMENT   Glucose, UA (A) NEGATIVE mg/dL    TEST NOT REPORTED DUE TO COLOR INTERFERENCE OF URINE PIGMENT   Hgb urine dipstick (A) NEGATIVE    TEST NOT REPORTED DUE TO COLOR INTERFERENCE OF URINE PIGMENT   Bilirubin Urine (A) NEGATIVE    TEST NOT REPORTED DUE TO COLOR INTERFERENCE OF URINE PIGMENT   Ketones, ur (A) NEGATIVE mg/dL    TEST NOT REPORTED DUE TO COLOR INTERFERENCE OF URINE PIGMENT   Protein, ur (A) NEGATIVE mg/dL    TEST NOT REPORTED DUE TO COLOR INTERFERENCE OF URINE PIGMENT   Nitrite (A) NEGATIVE    TEST NOT REPORTED DUE TO COLOR INTERFERENCE OF URINE PIGMENT   Leukocytes, UA (A) NEGATIVE    TEST NOT REPORTED DUE TO COLOR INTERFERENCE OF URINE PIGMENT   RBC / HPF TOO NUMEROUS TO COUNT 0 - 5 RBC/hpf   WBC, UA TOO NUMEROUS TO COUNT 0 - 5 WBC/hpf   Bacteria, UA NONE SEEN NONE SEEN   Squamous Epithelial / LPF NONE SEEN NONE SEEN    Comment: Performed at [facility], [address] [place], [place] [address]  Urine Culture     Status: Abnormal   Collection Time: 07/02/17  6:09 PM  Result Value Ref Range   Specimen Description      URINE, RANDOM Performed at [facility], [address] [place], [place] [address]    Special Requests      NONE Performed at [facility], [address], [place], [place] [address]    Culture MULTIPLE SPECIES PRESENT, SUGGEST RECOLLECTION (A)    Report Status 07/04/2017 FINAL   Urinalysis, Complete     Status: Abnormal   Collection Time: 07/26/17  4:04 PM  Result Value Ref Range   Specific Gravity, UA 1.025 1.005 - 1.030   pH, UA 5.5 5.0 - 7.5   Color, UA Red (A) Yellow   Appearance Ur Cloudy (A) Clear   Leukocytes, UA Negative Negative   Protein, UA 3+ (A) Negative/Trace   Glucose, UA Negative Negative   Ketones, UA Trace (A) Negative   RBC, UA 3+ (A) Negative    Bilirubin, UA Negative Negative   Urobilinogen, Ur 1.0 0.2 - 1.0 mg/dL   Nitrite, UA Negative Negative   Microscopic Examination See below:   Microscopic Examination     Status: Abnormal   Collection Time: 07/26/17  4:04 PM  Result Value Ref Range   WBC, UA None seen 0 - 5 /hpf   RBC, UA >30 (H) 0 - 2 /hpf   Epithelial Cells (non renal) None seen 0 - 10 /hpf   Crystals Present (A) N/A   Crystal Type Amorphous Sediment N/A    Comment: Calcium Oxalate   Mucus, UA Present (A) Not Estab.   Bacteria, UA Few (A) None seen/Few  CULTURE, URINE COMPREHENSIVE     Status: None   Collection Time: 07/26/17  4:18 PM  Result Value Ref Range   Urine Culture, Comprehensive Final report    Organism ID, Bacteria Comment     Comment: Mixed urogenital flora 50,000-100,000 colony forming units per mL   Stone analysis     Status: None   Collection Time: 08/14/17  9:12 AM  Result Value Ref Range   Color Tan    Size 3x2x2 mm   Stone Weight KSTONE 10.0 mg   Nidus No Nidus visualized    Ca Oxalate,Dihydrate 30 %   Ca Oxalate,Monohydr. 60 %   Ca phos cry stone ql IR 10 %   Composition Comment     Comment: Percentage (Represents the % composition)   SURFACE CRYSTALS Comment:     Comment: Calcium oxalate dihydrate   Photo Comment     Comment: Photograph will follow under separate cover.   Comment: Comment     Comment: (NOTE) Physician questions regarding Calculi Analysis contact [facility] at: [phone].    PLEASE NOTE: Comment     Comment: (NOTE) Calculi report with photograph will follow via computer, mail or courier delivery.    Disclaimer - Kidney Stone Analysis: Comment     Comment: (NOTE) This test was developed and its performance characteristics determined by [facility]. It has not been cleared or approved by the Food and Drug Administration. Performed At: [facility] [place], [place] [id number] Rush Farmer MD FO:[id number] Performed at [facility], [address] [place], [place] [address]      PHQ2/9: Depression screen [facility] 2/9 07/02/2017 02/14/2017 02/14/2016 12/23/2015 11/19/2015  Decreased Interest 0 0 0 0 0  Down, Depressed, Hopeless 1 0 0 0 0  PHQ - 2 Score 1 0 0 0 0    Fall Risk: Fall Risk  08/29/2017 07/02/2017 05/30/2017 02/14/2017 02/14/2016  Falls in the past year? No Yes Yes Yes Yes  Number falls in past yr: - 2 or more 2 or more 2 or more 2 or more  Injury with Fall? - Yes Yes No Yes  Risk Factor Category  - High Fall Risk High Fall Risk - High Fall Risk  Risk for fall due to : - - Impaired balance/gait;Impaired mobility Impaired balance/gait;Impaired mobility;History of fall(s) -     Functional Status Survey: Is the patient deaf or have difficulty hearing?: No Does the patient have difficulty seeing, even when wearing glasses/contacts?: No Does the patient have difficulty concentrating, remembering, or making decisions?: No Does the patient have difficulty walking or climbing stairs?: Yes Does the patient have difficulty dressing or bathing?: Yes Does the patient have difficulty doing errands alone such as visiting a doctor's office or shopping?: Yes   Assessment & Plan  1. Mild major depression ([place])  Continue Zoloft , he has been going to a lot of doctors and is worried   2. Prostate cancer [facility])  Seeing Dr. Bernardo Heater  3. Kidney stones  Had procedure, calcium oxalate stones, stent still present

## 2017-09-12 ENCOUNTER — Encounter: Payer: Self-pay | Admitting: Urology

## 2017-09-12 ENCOUNTER — Ambulatory Visit (INDEPENDENT_AMBULATORY_CARE_PROVIDER_SITE_OTHER): Payer: Medicare Other | Admitting: Urology

## 2017-09-12 VITALS — BP 130/83 | HR 76 | Ht 67.0 in | Wt 190.0 lb

## 2017-09-12 DIAGNOSIS — N201 Calculus of ureter: Secondary | ICD-10-CM | POA: Diagnosis not present

## 2017-09-12 DIAGNOSIS — Z9889 Other specified postprocedural states: Secondary | ICD-10-CM

## 2017-09-12 LAB — URINALYSIS, COMPLETE
Bilirubin, UA: NEGATIVE
Glucose, UA: NEGATIVE
Nitrite, UA: NEGATIVE
Specific Gravity, UA: 1.025 (ref 1.005–1.030)
Urobilinogen, Ur: 2 mg/dL — ABNORMAL HIGH (ref 0.2–1.0)
pH, UA: 6 (ref 5.0–7.5)

## 2017-09-12 LAB — MICROSCOPIC EXAMINATION: RBC, UA: >30 /hpf — ABNORMAL HIGH (ref 0–2)

## 2017-09-12 NOTE — Progress Notes (Signed)
Indications: Patient is 77 y.o., male status post ureteroscopic removal of a 7 mm right mid ureteral calculus on 08/14/2017 who presents today for stent removal.    Procedure:  Flexible Cystoscopy with stent removal (11735)  Timeout was performed and the correct patient, procedure and participants were identified.    Description:  The patient was prepped and draped in the usual sterile fashion. Flexible cystosopy was performed.  Visualization was suboptimal and urine was aspirated from the bladder for better visualization.  The stent was visualized, grasped, and removed intact without difficulty. The patient tolerated the procedure well.  A single dose of oral antibiotics was given.  Complications:  None  Plan: Renal ultrasound in 4 weeks

## 2017-09-13 ENCOUNTER — Encounter: Payer: Self-pay | Admitting: Urology

## 2017-09-21 ENCOUNTER — Ambulatory Visit
Admission: RE | Admit: 2017-09-21 | Discharge: 2017-09-21 | Disposition: A | Discharge status: Home / Self Care | Payer: Medicare Other | Source: Ambulatory Visit | Attending: Urology | Admitting: Urology

## 2017-09-21 DIAGNOSIS — Z9889 Other specified postprocedural states: Secondary | ICD-10-CM

## 2017-09-21 DIAGNOSIS — N281 Cyst of kidney, acquired: Secondary | ICD-10-CM | POA: Insufficient documentation

## 2017-09-21 DIAGNOSIS — N132 Hydronephrosis with renal and ureteral calculous obstruction: Secondary | ICD-10-CM | POA: Diagnosis not present

## 2017-09-21 DIAGNOSIS — N201 Calculus of ureter: Secondary | ICD-10-CM

## 2017-09-25 ENCOUNTER — Telehealth: Payer: Self-pay

## 2017-09-25 ENCOUNTER — Telehealth: Payer: Self-pay | Admitting: Urology

## 2017-09-25 DIAGNOSIS — N1330 Unspecified hydronephrosis: Secondary | ICD-10-CM

## 2017-09-25 NOTE — Telephone Encounter (Signed)
Patient's nurse called and stated that he was no longer taking the Tramadol and that they needed an order to discontinue it faxed to [name] @ [phone].  Sharyn Lull

## 2017-09-25 NOTE — Telephone Encounter (Signed)
-----   Message from Abbie Sons, MD sent at 09/24/2017  7:21 AM EDT ----- Renal ultrasound showed mild right hydronephrosis which was improved compared with preoperative imaging.  Recommend a follow-up appointment with [facility]/renal ultrasound and 4 months.

## 2017-09-25 NOTE — Telephone Encounter (Signed)
Letter to d/c medication typed and faxed.

## 2017-09-25 NOTE — Telephone Encounter (Signed)
Called pt's caregiver at [name], [name]) informed her of results. Also faxed results per Jessica's request to [phone]. Future orders placed.

## 2017-10-01 ENCOUNTER — Other Ambulatory Visit: Payer: Self-pay

## 2017-10-01 NOTE — Telephone Encounter (Signed)
Refill request for general medication. Calcium-Vitamin D  Last office visit  08/29/2017  Follow up on 11/30/2017

## 2017-10-03 ENCOUNTER — Other Ambulatory Visit: Payer: Self-pay

## 2017-10-03 MED ORDER — CALCIUM CARB-CHOLECALCIFEROL 500-400 MG-UNIT PO TABS: ORAL_TABLET | ORAL | 12 refills | Status: DC

## 2017-10-03 NOTE — Telephone Encounter (Signed)
Refill request for general medication: Aspirin 81 mg EC  Last office visit: 08/29/2017  Last physical exam: None indicated  Follow-ups on file. 11/30/2017

## 2017-10-04 MED ORDER — ASPIRIN 81 MG PO TABS: ORAL_TABLET | ORAL | 11 refills | Status: DC

## 2017-10-17 ENCOUNTER — Telehealth: Payer: Self-pay | Admitting: Urology

## 2017-10-17 NOTE — Telephone Encounter (Signed)
Unidentified lady LMOM asking for d/c orders for Tramadol to be sent to [facility], lady states pt no longer taking this medication and if there are any questions please call her at [phone]. Please Advise. Thanks.

## 2017-10-18 ENCOUNTER — Telehealth: Payer: Self-pay | Admitting: Urology

## 2017-10-18 NOTE — Telephone Encounter (Signed)
I called you about this.  She stopped by this morning.  Pt's caregiver needs a signed DC order to discontinue Tramadol HCL 50 mg.  She stated she could come back by and pick it up this afternoon.

## 2017-10-18 NOTE — Telephone Encounter (Signed)
Please print an order to DC tramadol and I will sign

## 2017-10-19 NOTE — Telephone Encounter (Signed)
Called pt's home they state that they need an order to d/c pt's tramadol. Will fax into pharmacy.

## 2017-10-19 NOTE — Telephone Encounter (Signed)
I have already resolved this with the pharmacy.

## 2017-11-19 ENCOUNTER — Other Ambulatory Visit: Payer: Self-pay | Admitting: Family Medicine

## 2017-11-19 NOTE — Telephone Encounter (Signed)
Refill request for general medication. Vitamin C  Last office visit 08/29/17   Follow up on 11/30/17

## 2017-11-21 ENCOUNTER — Other Ambulatory Visit: Payer: Self-pay | Admitting: Family Medicine

## 2017-11-21 DIAGNOSIS — K5909 Other constipation: Secondary | ICD-10-CM

## 2017-11-21 DIAGNOSIS — F320 Major depressive disorder, single episode, mild: Secondary | ICD-10-CM

## 2017-11-21 DIAGNOSIS — R350 Frequency of micturition: Secondary | ICD-10-CM

## 2017-11-21 NOTE — Telephone Encounter (Signed)
Refill request for general medication. Amitiza, Zoloft, and Uroxatral  Last office visit 08/29/2017   Follow up  11/30/17

## 2017-11-30 ENCOUNTER — Encounter: Payer: Self-pay | Admitting: Family Medicine

## 2017-11-30 ENCOUNTER — Ambulatory Visit (INDEPENDENT_AMBULATORY_CARE_PROVIDER_SITE_OTHER): Payer: Medicare Other | Admitting: Family Medicine

## 2017-11-30 VITALS — BP 128/84 | HR 78 | Temp 98.1°F | Resp 18 | Ht 67.0 in | Wt 190.0 lb

## 2017-11-30 DIAGNOSIS — R600 Localized edema: Secondary | ICD-10-CM

## 2017-11-30 DIAGNOSIS — I359 Nonrheumatic aortic valve disorder, unspecified: Secondary | ICD-10-CM | POA: Diagnosis not present

## 2017-11-30 DIAGNOSIS — I700 Atherosclerosis of aorta: Secondary | ICD-10-CM | POA: Diagnosis not present

## 2017-11-30 DIAGNOSIS — N39498 Other specified urinary incontinence: Secondary | ICD-10-CM

## 2017-11-30 DIAGNOSIS — K5909 Other constipation: Secondary | ICD-10-CM

## 2017-11-30 DIAGNOSIS — R739 Hyperglycemia, unspecified: Secondary | ICD-10-CM | POA: Diagnosis not present

## 2017-11-30 DIAGNOSIS — I2510 Atherosclerotic heart disease of native coronary artery without angina pectoris: Secondary | ICD-10-CM | POA: Diagnosis not present

## 2017-11-30 DIAGNOSIS — G809 Cerebral palsy, unspecified: Secondary | ICD-10-CM | POA: Diagnosis not present

## 2017-11-30 DIAGNOSIS — Z79899 Other long term (current) drug therapy: Secondary | ICD-10-CM

## 2017-11-30 DIAGNOSIS — I2584 Coronary atherosclerosis due to calcified coronary lesion: Secondary | ICD-10-CM

## 2017-11-30 DIAGNOSIS — F320 Major depressive disorder, single episode, mild: Secondary | ICD-10-CM | POA: Diagnosis not present

## 2017-11-30 DIAGNOSIS — R2681 Unsteadiness on feet: Secondary | ICD-10-CM

## 2017-11-30 MED ORDER — DEPEND SILHOUETTE BRIEFS L/XL MISC: 1.0000 | Freq: Two times a day (BID) | 5 refills | Status: DC | PRN

## 2017-11-30 MED ORDER — ATORVASTATIN CALCIUM 40 MG PO TABS: 40.0000 mg | ORAL_TABLET | Freq: Every evening | ORAL | 5 refills | Status: DC

## 2017-11-30 MED ORDER — SERTRALINE HCL 100 MG PO TABS: 100.0000 mg | ORAL_TABLET | Freq: Every day | ORAL | 5 refills | Status: DC

## 2017-11-30 NOTE — Progress Notes (Signed)
Name: [name]   MRN: [id number]    DOB: 05-30-1940   Date:11/30/2017       Progress Note  Subjective  Chief Complaint  Chief Complaint  Patient presents with  . Follow-up    patient is here for a 3 month f/u  . Depression  . Hypertension  . Hyperlipidemia  . Labs Only  . strengthening issues    therapists works with him sometimes but not often    HPI  Guardian is his nephew: [name] # [phone]  Today he is here with : [name] , [name] -he lives at [facility] group home  He has MR, poor historian. He has CP and is doing well  Lower leg edema: using pumps at night and compression stocking hoses during the day and has been doing well.   Atherosclerosis aorta, and coronary arteries with also calcified aortic valve, he does not seem to have chest pain, but he is on medical management   Depression: he is taking Zoloft daily, cooperative, caregiver [name]) states he is doing better now.  No crying spells, he gets moody at times. He is not violent. Eating his meals, but not asking for seconds, weight seems to be stable.   History of kidney stones: seeing Dr. Bernardo Heater and had a cystoscopy, right ureteroscopy and stone removal and stent placement, urinary incontinence has improved, has to change depends at most twice day now and caregiver asked for another order   Prostate cancer: had brachytherapy in 2010 , was seeing Dr. Jacqlyn Larsen, currently under the care of Dr. Bernardo Heater.  Patient Active Problem List   Diagnosis Date Noted  . Renal mass 07/23/2017  . Ureteral calculus 07/23/2017  . Atherosclerosis of aorta ([place]) 07/04/2017  . Aortic valve calcification 07/04/2017  . Coronary artery calcification 07/04/2017  . Hypertension 10/26/2016  . Late effect acute polio 05/17/2016  . Chronic constipation 11/19/2015  . Mild major depression ([place]) 11/19/2015  . Complicated grieving 54/65/6812  . Recurrent UTI 09/11/2015  . Adenocarcinoma of prostate  ([place]) 03/02/2015  . Amyotrophia 03/02/2015  . Edema leg 03/02/2015  . Cerebral palsy ([place]) 03/02/2015  . Dyslipidemia 03/02/2015  . Dermatitis, eczematoid 03/02/2015  . 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 H/O acute poliomyelitis 03/02/2015  . Lymphedema 03/02/2015  . Mental retardation 03/02/2015  . Hypertrophy of nail 03/02/2015  . Adiposity 03/02/2015  . Absence of bladder continence 03/02/2015  . Chronic venous insufficiency 03/02/2015  . Avitaminosis D 03/02/2015  . Adynamia 03/02/2015  . Dependent on wheelchair 03/02/2015  . Kidney cysts 03/02/2015  . At risk for falling 03/02/2015  . Cerebral vascular disease 03/02/2015  . Strabismus 02/11/2015  . Urge incontinence of urine 02/11/2015  . Wheelchair dependence 02/11/2015  . Obesity 02/11/2015  . Urinary frequency 10/30/2014    Past Surgical History:  Procedure Laterality Date  . CYSTOSCOPY/URETEROSCOPY/HOLMIUM LASER/STENT PLACEMENT Right 08/14/2017   Procedure: CYSTOSCOPY/URETEROSCOPY/HOLMIUM LASER/STENT PLACEMENT;  Surgeon: Abbie Sons, MD;  Location: [facility] ORS;  Service: Urology;  Laterality: Right;  . leg circulation surgery Right   . PROSTATE SURGERY  11/10/2008   BRACHYTHERAPY    Family History  Problem Relation Age of Onset  . Heart attack Brother   . Heart disease Unknown     Social History   Socioeconomic History  . Marital status: Single    Spouse name: Not on file  . Number of children: Not on file  . Years of education: Not on file  . Highest education level:  Not on file  Occupational History  . Not on file  Social Needs  . Financial resource strain: Not on file  . Food insecurity:    Worry: Not on file    Inability: Not on file  . Transportation needs:    Medical: Not on file    Non-medical: Not on file  Tobacco Use  . Smoking status: Never Smoker  . Smokeless tobacco: Never Used  Substance and Sexual Activity  . Alcohol use: No    Alcohol/week: 0.0 standard drinks  . Drug use: No  .  Sexual activity: Not Currently  Lifestyle  . Physical activity:    Days per week: Not on file    Minutes per session: Not on file  . Stress: Not on file  Relationships  . Social connections:    Talks on phone: Not on file    Gets together: Not on file    Attends religious service: Not on file    Active member of club or organization: Not on file    Attends meetings of clubs or organizations: Not on file    Relationship status: Not on file  . Intimate partner violence:    Fear of current or ex partner: Not on file    Emotionally abused: Not on file    Physically abused: Not on file    Forced sexual activity: Not on file  Other Topics Concern  . Not on file  Social History Narrative   Lives at [facility] group home, 5 males.          Current Outpatient Medications:  .  alfuzosin (UROXATRAL) 10 MG 24 hr tablet, TAKE 1 TABLET (10MG ) BY MOUTH ONCE DAILY WITH BREAKFAST*DO NOT CRUSH*, Disp: 34 tablet, Rfl: 0 .  ALPRAZolam (XANAX) 0.25 MG tablet, TAKE ONE TABLET BY MOUTH 2 TIMES A DAY AS NEEDED FOR ANXIETY (WHEN BLOOD PRESSURE IS VERY HIGH OR ANXIOUS), Disp: 20 tablet, Rfl: 0 .  AMITIZA 24 MCG capsule, TAKE ONE CAPSULE BY MOUTH TWO TIMES A DAY WITH A MEAL, Disp: 74 capsule, Rfl: 0 .  ARTIFICIAL TEAR SOLUTION OP, Apply 2 drops to eye 2 (two) times daily as needed (dry eyes)., Disp: , Rfl:  .  aspirin 81 MG tablet, 1 tablet daily, Disp: 30 tablet, Rfl: 11 .  atorvastatin (LIPITOR) 40 MG tablet, Take 1 tablet (40 mg total) by mouth every evening., Disp: 30 tablet, Rfl: 5 .  betamethasone dipropionate (DIPROLENE) 0.05 % cream, Apply topically as needed. (Patient taking differently: Apply 1 application topically as needed (rash). ), Disp: 45 g, Rfl: 1 .  Calcium Carb-Cholecalciferol 500-400 MG-UNIT TABS, TAKE ONE TABLET BY MOUTH 2 TIMES A DAY, Disp: 60 tablet, Rfl: 12 .  Cranberry Juice Powder 425 MG CAPS, TAKE ONE CAPSULE BY MOUTH EVERY DAY, Disp: 37 capsule, Rfl: PRN .  Elastic Bandages &  Supports (MEDICAL COMPRESSION STOCKINGS) MISC, 1 Units by Does not apply route daily., Disp: 2 each, Rfl: 0 .  Incontinence Supply Disposable (DEPEND SILHOUETTE BRIEFS L/XL) MISC, 1 each by Does not apply route 4 (four) times daily., Disp: 120 each, Rfl: 5 .  Polyethyl Glycol-Propyl Glycol (SYSTANE ULTRA) 0.4-0.3 % SOLN, Apply 1 drop to eye 2 (two) times daily. Both eyes, Disp: , Rfl:  .  sertraline (ZOLOFT) 100 MG tablet, Take 1 tablet (100 mg total) by mouth daily., Disp: 30 tablet, Rfl: 5 .  vitamin C (ASCORBIC ACID) 500 MG tablet, TAKE ONE TABLET BY MOUTH TWO TIMES A DAY, Disp: 59  tablet, Rfl: 0  No Known Allergies   ROS  Constitutional: Negative for fever or weight change.  Respiratory: Negative for cough and shortness of breath.   Cardiovascular: Negative for chest pain or palpitations.  Gastrointestinal: Negative for abdominal pain, no bowel changes.  Musculoskeletal: positive  for gait problem but no  joint swelling.  Skin: Negative for rash.  Neurological: Negative for dizziness or headache.  No other specific complaints in a complete review of systems (except as listed in HPI above).  Objective  Vitals:   11/30/17 0930  BP: 128/84  Pulse: 78  Resp: 18  Temp: 98.1 F (36.7 C)  TempSrc: Oral  SpO2: 95%  Weight: 190 lb (86.2 kg)  Height: 5\' 7"  (1.702 m)    Body mass index is 29.76 kg/m.  Physical Exam  Constitutional: Patient appears well-developed and overweight. No distress.  HEENT: head atraumatic, normocephalic, pupils equal and reactive to light, neck supple, throat within normal limits Cardiovascular: Normal rate, regular rhythm and normal heart sounds.  No murmur heard. Trace  BLE edema. Wearing compression stocking hoses Pulmonary/Chest: Effort normal and breath sounds normal. No respiratory distress. Abdominal: Soft.  There is no tenderness. Psychiatric: Patient is cooperative Muscular skeletal: atrophy on both hands, sitting on wheelchair and leaning  forward, atrophy on legs    Recent Results (from the past 2160 hour(s))  Urinalysis, Complete     Status: Abnormal   Collection Time: 09/12/17 11:34 AM  Result Value Ref Range   Specific Gravity, UA 1.025 1.005 - 1.030   pH, UA 6.0 5.0 - 7.5   Color, UA Red (A) Yellow   Appearance Ur Cloudy (A) Clear   Leukocytes, UA 3+ (A) Negative   Protein, UA 3+ (A) Negative/Trace   Glucose, UA Negative Negative   Ketones, UA Trace (A) Negative   RBC, UA 3+ (A) Negative   Bilirubin, UA Negative Negative   Urobilinogen, Ur 2.0 (H) 0.2 - 1.0 mg/dL   Nitrite, UA Negative Negative   Microscopic Examination See below:   Microscopic Examination     Status: Abnormal   Collection Time: 09/12/17 11:34 AM  Result Value Ref Range   WBC, UA >30 (H) 0 - 5 /hpf   RBC, UA >30 (H) 0 - 2 /hpf   Epithelial Cells (non renal) 0-10 0 - 10 /hpf   Mucus, UA Present (A) Not Estab.   Bacteria, UA Moderate (A) None seen/Few      PHQ2/9: Depression screen [facility] 2/9 11/30/2017 07/02/2017 02/14/2017 02/14/2016 12/23/2015  Decreased Interest 0 0 0 0 0  Down, Depressed, Hopeless 0 1 0 0 0  PHQ - 2 Score 0 1 0 0 0  Altered sleeping 0 - - - -  Tired, decreased energy 0 - - - -  Change in appetite 0 - - - -  Feeling bad or failure about yourself  0 - - - -  Trouble concentrating 0 - - - -  Moving slowly or fidgety/restless 0 - - - -  Suicidal thoughts 0 - - - -  PHQ-9 Score 0 - - - -  Difficult doing work/chores Not difficult at all - - - -     Fall Risk: Fall Risk  11/30/2017 08/29/2017 07/02/2017 05/30/2017 02/14/2017  Falls in the past year? No No Yes Yes Yes  Number falls in past yr: - - 2 or more 2 or more 2 or more  Injury with Fall? - - Yes Yes No  Risk Factor Category  - -  High Fall Risk High Fall Risk -  Risk for fall due to : - - - Impaired balance/gait;Impaired mobility Impaired balance/gait;Impaired mobility;History of fall(s)     Functional Status Survey: Is the patient deaf or have difficulty hearing?:  No Does the patient have difficulty seeing, even when wearing glasses/contacts?: No Does the patient have difficulty concentrating, remembering, or making decisions?: Yes Does the patient have difficulty walking or climbing stairs?: Yes Does the patient have difficulty dressing or bathing?: Yes Does the patient have difficulty doing errands alone such as visiting a doctor's office or shopping?: Yes   Assessment & Plan  1. Atherosclerosis of aorta (HCC)  - atorvastatin (LIPITOR) 40 MG tablet; Take 1 tablet (40 mg total) by mouth every evening.  Dispense: 30 tablet; Refill: 5 - Lipid panel  2. Aortic valve calcification  - atorvastatin (LIPITOR) 40 MG tablet; Take 1 tablet (40 mg total) by mouth every evening.  Dispense: 30 tablet; Refill: 5  3. Coronary artery calcification  - atorvastatin (LIPITOR) 40 MG tablet; Take 1 tablet (40 mg total) by mouth every evening.  Dispense: 30 tablet; Refill: 5 - Lipid panel  4. Mild major depression (HCC)  - sertraline (ZOLOFT) 100 MG tablet; Take 1 tablet (100 mg total) by mouth daily.  Dispense: 30 tablet; Refill: 5 - CBC with Differential/Platelet - COMPLETE METABOLIC PANEL WITH GFR  5. Cerebral palsy, unspecified type ([place])  stable  6. Chronic constipation  Doing well on Amitiza   7. Hyperglycemia  - Hemoglobin A1c  8. Long-term use of high-risk medication  - CBC with Differential/Platelet - COMPLETE METABOLIC PANEL WITH GFR  9. Gait instability  Caregiver has noticed he seems weaker again and would like to resume PT at group home    10. Other urinary incontinence  - Incontinence Supply Disposable (DEPEND SILHOUETTE BRIEFS L/XL) MISC; 1 each by Does not apply route 2 (two) times daily as needed.  Dispense: 60 each; Refill: 5

## 2017-12-01 LAB — CBC WITH DIFFERENTIAL/PLATELET
BASOS ABS: 21 {cells}/uL (ref 0–200)
BASOS PCT: 0.5 %
EOS ABS: 59 {cells}/uL (ref 15–500)
Eosinophils Relative: 1.4 %
HCT: 40.2 % (ref 38.5–50.0)
Hemoglobin: 13.9 g/dL (ref 13.2–17.1)
Lymphs Abs: 1000 cells/uL (ref 850–3900)
MCH: 32.6 pg (ref 27.0–33.0)
MCHC: 34.6 g/dL (ref 32.0–36.0)
MCV: 94.4 fL (ref 80.0–100.0)
MONOS PCT: 7.5 %
MPV: 11 fL (ref 7.5–12.5)
Neutro Abs: 2806 cells/uL (ref 1500–7800)
Neutrophils Relative %: 66.8 %
PLATELETS: 156 10*3/uL (ref 140–400)
RBC: 4.26 10*6/uL (ref 4.20–5.80)
RDW: 12.4 % (ref 11.0–15.0)
TOTAL LYMPHOCYTE: 23.8 %
WBC: 4.2 10*3/uL (ref 3.8–10.8)
WBCMIX: 315 {cells}/uL (ref 200–950)

## 2017-12-01 LAB — COMPLETE METABOLIC PANEL WITH GFR
AG RATIO: 1.5 (calc) (ref 1.0–2.5)
ALBUMIN MSPROF: 4.1 g/dL (ref 3.6–5.1)
ALKALINE PHOSPHATASE (APISO): 133 U/L — AB (ref 40–115)
ALT: 15 U/L (ref 9–46)
AST: 17 U/L (ref 10–35)
BILIRUBIN TOTAL: 0.6 mg/dL (ref 0.2–1.2)
BUN: 17 mg/dL (ref 7–25)
CHLORIDE: 107 mmol/L (ref 98–110)
CO2: 26 mmol/L (ref 20–32)
Calcium: 9.3 mg/dL (ref 8.6–10.3)
Creat: 0.72 mg/dL (ref 0.70–1.18)
GFR, EST AFRICAN AMERICAN: 106 mL/min/{1.73_m2} (ref >=60)
GFR, Est Non African American: 91 mL/min/{1.73_m2} (ref >=60)
GLUCOSE: 92 mg/dL (ref 65–99)
Globulin: 2.7 g/dL (calc) (ref 1.9–3.7)
POTASSIUM: 4.5 mmol/L (ref 3.5–5.3)
Sodium: 141 mmol/L (ref 135–146)
TOTAL PROTEIN: 6.8 g/dL (ref 6.1–8.1)

## 2017-12-01 LAB — LIPID PANEL
CHOLESTEROL: 72 mg/dL (ref <200)
HDL: 34 mg/dL — ABNORMAL LOW (ref >40)
LDL Cholesterol (Calc): 24 mg/dL (calc)
Non-HDL Cholesterol (Calc): 38 mg/dL (calc) (ref <130)
Total CHOL/HDL Ratio: 2.1 (calc) (ref <5.0)
Triglycerides: 64 mg/dL (ref <150)

## 2017-12-01 LAB — HEMOGLOBIN A1C
EAG (MMOL/L): 5.2 (calc)
HEMOGLOBIN A1C: 4.9 %{Hb} (ref <5.7)
MEAN PLASMA GLUCOSE: 94 (calc)

## 2017-12-20 ENCOUNTER — Other Ambulatory Visit: Payer: Self-pay

## 2017-12-20 DIAGNOSIS — L308 Other specified dermatitis: Secondary | ICD-10-CM

## 2017-12-20 NOTE — Telephone Encounter (Signed)
Refill request for general medication. Betamethasone and Artifical tears drop  Last office visit 11/30/2017   Follow up on 02/19/18

## 2017-12-21 MED ORDER — BETAMETHASONE DIPROPIONATE 0.05 % EX CREA
1.0000 | TOPICAL_CREAM | CUTANEOUS | 0 refills | Status: DC | PRN
Start: 2017-12-21 — End: 2018-01-18

## 2018-01-18 ENCOUNTER — Other Ambulatory Visit: Payer: Self-pay

## 2018-01-18 DIAGNOSIS — L308 Other specified dermatitis: Secondary | ICD-10-CM

## 2018-01-18 NOTE — Telephone Encounter (Signed)
Refill request was sent to Dr. Krichna Sowles for approval and submission.  

## 2018-01-20 MED ORDER — BETAMETHASONE DIPROPIONATE 0.05 % EX CREA
1.0000 | TOPICAL_CREAM | CUTANEOUS | 0 refills | Status: DC | PRN
Start: 2018-01-20 — End: 2019-03-14

## 2018-01-22 ENCOUNTER — Telehealth: Payer: Self-pay | Admitting: Family Medicine

## 2018-01-22 NOTE — Telephone Encounter (Signed)
Copied from [place] [id number]. Topic: General - Other >> Jan 22, 2018  2:46 PM Lennox Solders wrote: Reason for CRM: Natasha Mead  at [facility] in  [place] is calling and would like to know how often the patient can apply diprolene 0.05% cream

## 2018-01-22 NOTE — Telephone Encounter (Signed)
Once/day prn.

## 2018-01-23 ENCOUNTER — Encounter: Payer: Self-pay | Admitting: Family Medicine

## 2018-01-23 NOTE — Telephone Encounter (Signed)
[name] at [facility] notified instructions should be once daily prn

## 2018-01-24 ENCOUNTER — Other Ambulatory Visit: Payer: Self-pay | Admitting: Family Medicine

## 2018-01-24 DIAGNOSIS — K5909 Other constipation: Secondary | ICD-10-CM

## 2018-01-24 DIAGNOSIS — R350 Frequency of micturition: Secondary | ICD-10-CM

## 2018-01-25 ENCOUNTER — Ambulatory Visit
Admission: RE | Admit: 2018-01-25 | Discharge: 2018-01-25 | Disposition: A | Discharge status: Home / Self Care | Payer: Medicare Other | Source: Ambulatory Visit | Attending: Urology | Admitting: Urology

## 2018-01-25 DIAGNOSIS — N1330 Unspecified hydronephrosis: Secondary | ICD-10-CM

## 2018-02-04 ENCOUNTER — Ambulatory Visit
Admission: RE | Admit: 2018-02-04 | Discharge: 2018-02-04 | Disposition: A | Discharge status: Home / Self Care | Payer: Medicare Other | Source: Ambulatory Visit | Attending: Urology | Admitting: Urology

## 2018-02-04 DIAGNOSIS — N400 Enlarged prostate without lower urinary tract symptoms: Secondary | ICD-10-CM | POA: Insufficient documentation

## 2018-02-04 DIAGNOSIS — Z87442 Personal history of urinary calculi: Secondary | ICD-10-CM | POA: Diagnosis not present

## 2018-02-04 DIAGNOSIS — N281 Cyst of kidney, acquired: Secondary | ICD-10-CM | POA: Diagnosis not present

## 2018-02-04 DIAGNOSIS — R195 Other fecal abnormalities: Secondary | ICD-10-CM | POA: Diagnosis not present

## 2018-02-04 DIAGNOSIS — N1330 Unspecified hydronephrosis: Secondary | ICD-10-CM

## 2018-02-05 ENCOUNTER — Other Ambulatory Visit: Payer: Self-pay | Admitting: Family Medicine

## 2018-02-05 DIAGNOSIS — H25813 Combined forms of age-related cataract, bilateral: Secondary | ICD-10-CM | POA: Diagnosis not present

## 2018-02-05 DIAGNOSIS — H5213 Myopia, bilateral: Secondary | ICD-10-CM | POA: Diagnosis not present

## 2018-02-08 ENCOUNTER — Encounter: Payer: Self-pay | Admitting: Urology

## 2018-02-08 ENCOUNTER — Ambulatory Visit (INDEPENDENT_AMBULATORY_CARE_PROVIDER_SITE_OTHER): Payer: Medicare Other | Admitting: Urology

## 2018-02-08 VITALS — BP 166/73 | HR 76 | Ht 67.0 in | Wt 190.0 lb

## 2018-02-08 DIAGNOSIS — Z87442 Personal history of urinary calculi: Secondary | ICD-10-CM | POA: Diagnosis not present

## 2018-02-08 NOTE — Progress Notes (Signed)
02/08/2018 3:35 PM   [name] [birth year] [id number]  Referring provider: Steele Sizer, MD [address] [place], [place] [address]  Chief Complaint  Patient presents with  . Follow-up  . Results    HPI: 77 year old male presents for follow-up.  He is status post ureteroscopic removal of a 7 mm right mid ureteral calculus in April 2019.  His stent was removed in late May 2019.  He has had no problems post stent removal.  He presents today for review a follow-up KUB/renal ultrasound.   PMH: Past Medical History:  Diagnosis Date  . Anxiety   . Atrophy, disuse, muscle   . BPH (benign prostatic hyperplasia)   . Cerebral palsy ([place])   . Eczema   . Elevated lipids   . Elevated PSA   . Exotropia   . Hematuria, gross   . Hyperlipidemia   . Incontinence   . Kidney stone   . Lower extremity edema    lymph drainage problems  . Lower urinary tract symptoms   . Lymphedema   . Mental retardation   . Over weight   . Prostate cancer ([place])   . Urinary frequency   . Urinary urgency   . UTI (urinary tract infection)   . Venous insufficiency   . Vitamin D deficiency   . Wheelchair dependence     Surgical History: Past Surgical History:  Procedure Laterality Date  . CYSTOSCOPY/URETEROSCOPY/HOLMIUM LASER/STENT PLACEMENT Right 08/14/2017   Procedure: CYSTOSCOPY/URETEROSCOPY/HOLMIUM LASER/STENT PLACEMENT;  Surgeon: Abbie Sons, MD;  Location: [facility] ORS;  Service: Urology;  Laterality: Right;  . leg circulation surgery Right   . PROSTATE SURGERY  11/10/2008   BRACHYTHERAPY    Home Medications:  Allergies as of 02/08/2018   No Known Allergies     Medication List        Accurate as of 02/08/18  3:35 PM. Always use your most recent med list.          alfuzosin 10 MG 24 hr tablet Commonly known as:  UROXATRAL TAKE 1 TABLET (10MG ) BY MOUTH ONCE DAILY WITH BREAKFAST*DO NOT CRUSH*   ALPRAZolam 0.25 MG tablet Commonly known as:  XANAX TAKE ONE  TABLET BY MOUTH 2 TIMES A DAY AS NEEDED FOR ANXIETY (WHEN BLOOD PRESSURE IS VERY HIGH OR ANXIOUS)   AMITIZA 24 MCG capsule Generic drug:  lubiprostone TAKE ONE CAPSULE BY MOUTH TWO TIMES A DAY WITH A MEAL   ARTIFICIAL TEAR SOLUTION OP Apply 2 drops to eye 2 (two) times daily as needed (dry eyes).   aspirin 81 MG EC tablet TAKE ONE TABLET BY MOUTH EVERY DAY   atorvastatin 40 MG tablet Commonly known as:  LIPITOR Take 1 tablet (40 mg total) by mouth every evening.   betamethasone dipropionate 0.05 % cream Commonly known as:  DIPROLENE Apply 1 application topically as needed (rash).   Calcium Carb-Cholecalciferol 500-400 MG-UNIT Tabs TAKE ONE TABLET BY MOUTH 2 TIMES A DAY   Cranberry Juice Powder 425 MG Caps TAKE ONE CAPSULE BY MOUTH EVERY DAY   DEPEND SILHOUETTE BRIEFS L/XL Misc 1 each by Does not apply route 2 (two) times daily as needed.   Medical Compression Stockings Misc 1 Units by Does not apply route daily.   sertraline 100 MG tablet Commonly known as:  ZOLOFT Take 1 tablet (100 mg total) by mouth daily.   SYSTANE ULTRA 0.4-0.3 % Soln Generic drug:  Polyethyl Glycol-Propyl Glycol Apply 1 drop to eye 2 (two) times daily. Both eyes  vitamin C 500 MG tablet Commonly known as:  ASCORBIC ACID TAKE ONE TABLET BY MOUTH TWO TIMES A DAY       Allergies: No Known Allergies  Family History: Family History  Problem Relation Age of Onset  . Heart attack Brother   . Heart disease Unknown     Social History:  reports that he has never smoked. He has never used smokeless tobacco. He reports that he does not drink alcohol or use drugs.  ROS: UROLOGY Frequent Urination?: No Hard to postpone urination?: No Burning/pain with urination?: No Get up at night to urinate?: No Leakage of urine?: Yes Urine stream starts and stops?: No Trouble starting stream?: No Do you have to strain to urinate?: Yes Blood in urine?: No Urinary tract infection?: No Sexually  transmitted disease?: No Injury to kidneys or bladder?: No Painful intercourse?: No Weak stream?: No Erection problems?: No Penile pain?: No  Gastrointestinal Nausea?: No Vomiting?: No Indigestion/heartburn?: No Diarrhea?: No Constipation?: No  Constitutional Fever: No Night sweats?: No Weight loss?: No Fatigue?: No  Skin Skin rash/lesions?: No Itching?: No  Eyes Blurred vision?: No Double vision?: No  Ears/Nose/Throat Sore throat?: No Sinus problems?: No  Hematologic/Lymphatic Swollen glands?: No Easy bruising?: No  Cardiovascular Leg swelling?: Yes Chest pain?: No  Respiratory Cough?: Yes Shortness of breath?: No  Endocrine Excessive thirst?: No  Musculoskeletal Back pain?: No Joint pain?: No  Neurological Headaches?: No Dizziness?: No  Psychologic Depression?: Yes Anxiety?: Yes  Physical Exam: BP (!) 166/73 (BP Location: Left Arm, Patient Position: Sitting, Cuff Size: Large)   Pulse 76   Ht 5\' 7"  (1.702 m)   Wt 190 lb (86.2 kg)   BMI 29.76 kg/m   Constitutional:  Alert and oriented, No acute distress.  Pertinent Imaging:  Results for orders placed during the hospital encounter of 02/04/18  Abdomen 1 view (KUB)   Narrative CLINICAL DATA:  History of prostate cancer, hydronephrosis, kidney stones.  EXAM: ABDOMEN - 1 VIEW  COMPARISON:  CT scan of the abdomen and pelvis of July 02, 2017  FINDINGS: The colonic stool burden is moderate. There is no small or large bowel obstructive pattern. No abnormal calcifications project over either kidney nor along the course of the ureters nor over the urinary bladder. There is degenerative change of the lower lumbar spine. There are mild degenerative changes of both hips. Prostatic seed implants are present.  IMPRESSION: Moderately increased colonic stool burden may reflect constipation in the appropriate clinical setting. No calcified urinary tract stones are observed.   Electronically  Signed   By: David  Martinique M.D.   On: 02/05/2018 07:45     Results for orders placed during the hospital encounter of 01/25/18  Ultrasound renal complete   Narrative CLINICAL DATA:  History of hydronephrosis and renal stones  EXAM: RENAL / URINARY TRACT ULTRASOUND COMPLETE  COMPARISON:  Renal ultrasound of Sep 21, 2017  FINDINGS: Right Kidney:  Length: 10.5 cm. The renal cortical echotexture is approximately equal to that of the adjacent liver. There is no hydronephrosis. There are non simple cysts present. In the midpole there is a cyst measuring 1.6 x 1.7 x 2.1 cm. In the lower pole there is a cyst measuring 1.9 x 1.3 x 1.6 cm.  Left Kidney:  Length: 11.9 cm. The renal cortical echotexture is similar to that on the right. There is a septated upper pole cyst measuring 2.5 x 2.4 x 1.8 cm. There is no hydronephrosis.  Bladder:  The bladder is  adequately distended. Ureteral jets are observed bilaterally. There is irregular soft tissue density at the bladder base which may reflect the enlarged prostate. A bladder mass measuring 2.3 x 0.7 x 3.2 cm is not excluded. This is not new however. There is a moderate-sized postvoid residual volume  IMPRESSION: No hydronephrosis. Bilateral mildly complex renal cysts. No discrete stones are observed today. Mildly increased renal cortical echotexture bilaterally may reflect medical renal disease.  Moderate-sized postvoid residual urinary bladder volume. Enlarged prostate gland producing an impression upon the bladder base versus and in transit bladder wall mass. This is not new.   Electronically Signed   By: David  Martinique M.D.   On: 02/05/2018 07:44    Assessment & Plan:   Doing well status post ureteroscopic stone removal.  Follow-up renal ultrasound shows no hydronephrosis.  He underwent cystoscopy at the time of ureteroscopy and there was no pathologically abnormal bladder mass.  Recommend a follow-up visit and KUB in 1  year.   Abbie Sons, [place] [address], [place], [place] [address] [phone]

## 2018-02-19 ENCOUNTER — Ambulatory Visit (INDEPENDENT_AMBULATORY_CARE_PROVIDER_SITE_OTHER): Payer: Medicare Other | Admitting: Family Medicine

## 2018-02-19 ENCOUNTER — Encounter: Payer: Self-pay | Admitting: Family Medicine

## 2018-02-19 VITALS — BP 130/74 | HR 76 | Temp 97.9°F | Ht 67.0 in | Wt 190.0 lb

## 2018-02-19 DIAGNOSIS — Z23 Encounter for immunization: Secondary | ICD-10-CM | POA: Diagnosis not present

## 2018-02-19 DIAGNOSIS — Z0000 Encounter for general adult medical examination without abnormal findings: Secondary | ICD-10-CM

## 2018-02-19 NOTE — Progress Notes (Signed)
. Patient: [name], Male    DOB: Feb 04, 1941, 77 y.o.   MRN: [id number]  Visit Date: 02/19/2018  Today's Provider: Loistine Chance, MD   Chief Complaint  Patient presents with  . Paperwork    FL2    Subjective:   [name] is a 77 y.o. male who presents today for his Subsequent Annual Wellness Visit.  Patient/Caregiver input:    HPI  Review of Systems Constitutional: Negative for fever or weight change.  Respiratory: Negative for cough and shortness of breath.   Cardiovascular: Negative for chest pain or palpitations.  Gastrointestinal: Negative for abdominal pain, no bowel changes.  Musculoskeletal: Negative for gait problem or joint swelling.  Skin: Negative for rash.  Neurological: Negative for dizziness or headache.  No other specific complaints in a complete review of systems (except as listed in HPI above).  Past Medical History:  Diagnosis Date  . Anxiety   . Atrophy, disuse, muscle   . BPH (benign prostatic hyperplasia)   . Cerebral palsy ([place])   . Eczema   . Elevated lipids   . Elevated PSA   . Exotropia   . Hematuria, gross   . Hyperlipidemia   . Incontinence   . Kidney stone   . Lower extremity edema    lymph drainage problems  . Lower urinary tract symptoms   . Lymphedema   . Mental retardation   . Over weight   . Prostate cancer ([place])   . Urinary frequency   . Urinary urgency   . UTI (urinary tract infection)   . Venous insufficiency   . Vitamin D deficiency   . Wheelchair dependence     Past Surgical History:  Procedure Laterality Date  . CYSTOSCOPY/URETEROSCOPY/HOLMIUM LASER/STENT PLACEMENT Right 08/14/2017   Procedure: CYSTOSCOPY/URETEROSCOPY/HOLMIUM LASER/STENT PLACEMENT;  Surgeon: Abbie Sons, MD;  Location: [facility] ORS;  Service: Urology;  Laterality: Right;  . leg circulation surgery Right   . PROSTATE SURGERY  11/10/2008   BRACHYTHERAPY    Family History  Problem Relation Age of Onset  . Heart attack Brother    . Heart disease Unknown     Social History   Socioeconomic History  . Marital status: Single    Spouse name: Not on file  . Number of children: Not on file  . Years of education: Not on file  . Highest education level: Not on file  Occupational History  . Not on file  Social Needs  . Financial resource strain: Not hard at all  . Food insecurity:    Worry: Never true    Inability: Never true  . Transportation needs:    Medical: No    Non-medical: No  Tobacco Use  . Smoking status: Never Smoker  . Smokeless tobacco: Never Used  Substance and Sexual Activity  . Alcohol use: No    Alcohol/week: 0.0 standard drinks  . Drug use: No  . Sexual activity: Not Currently  Lifestyle  . Physical activity:    Days per week: 0 days    Minutes per session: 0 min  . Stress: Not at all  Relationships  . Social connections:    Talks on phone: Never    Gets together: Never    Attends religious service: Never    Active member of club or organization: No    Attends meetings of clubs or organizations: Never    Relationship status: Never married  . Intimate partner violence:    Fear of current or ex partner: Patient  refused    Emotionally abused: Patient refused    Physically abused: Patient refused    Forced sexual activity: Patient refused  Other Topics Concern  . Not on file  Social History Narrative   Lives at [facility] group home, 5 males.         Outpatient Encounter Medications as of 02/19/2018  Medication Sig  . alfuzosin (UROXATRAL) 10 MG 24 hr tablet TAKE 1 TABLET (10MG ) BY MOUTH ONCE DAILY WITH BREAKFAST*DO NOT CRUSH*  . ALPRAZolam (XANAX) 0.25 MG tablet TAKE ONE TABLET BY MOUTH 2 TIMES A DAY AS NEEDED FOR ANXIETY (WHEN BLOOD PRESSURE IS VERY HIGH OR ANXIOUS)  . AMITIZA 24 MCG capsule TAKE ONE CAPSULE BY MOUTH TWO TIMES A DAY WITH A MEAL  . ARTIFICIAL TEAR SOLUTION OP Apply 2 drops to eye 2 (two) times daily as needed (dry eyes).  Marland Kitchen aspirin (ASPIRIN ADULT LOW  STRENGTH) 81 MG EC tablet TAKE ONE TABLET BY MOUTH EVERY DAY  . atorvastatin (LIPITOR) 40 MG tablet Take 1 tablet (40 mg total) by mouth every evening.  . betamethasone dipropionate (DIPROLENE) 0.05 % cream Apply 1 application topically as needed (rash).  . Calcium Carb-Cholecalciferol 500-400 MG-UNIT TABS TAKE ONE TABLET BY MOUTH 2 TIMES A DAY  . Cranberry Juice Powder 425 MG CAPS TAKE ONE CAPSULE BY MOUTH EVERY DAY  . Polyethyl Glycol-Propyl Glycol (SYSTANE ULTRA) 0.4-0.3 % SOLN Apply 1 drop to eye 2 (two) times daily. Both eyes  . sertraline (ZOLOFT) 100 MG tablet Take 1 tablet (100 mg total) by mouth daily.  . vitamin C (ASCORBIC ACID) 500 MG tablet TAKE ONE TABLET BY MOUTH TWO TIMES A DAY  . Elastic Bandages & Supports (MEDICAL COMPRESSION STOCKINGS) MISC 1 Units by Does not apply route daily.  . Incontinence Supply Disposable (DEPEND SILHOUETTE BRIEFS L/XL) MISC 1 each by Does not apply route 2 (two) times daily as needed.   No facility-administered encounter medications on file as of 02/19/2018.     No Known Allergies  Care Team Updated in EHR: Yes  Last Vision Exam:  Wears corrective lenses: Yes Last Dental Exam: no teeth  Last Hearing Exam: Wears Hearing Aids: No  Functional Ability / Safety Screening 1.  Was the timed Get Up and Go test longer than 30 seconds?  no 2.  Does the patient need help with the phone, transportation, shopping,      preparing meals, housework, laundry, medications, or managing money?  no 3.  Does the patient's home have:  loose throw rugs in the hallway?   no      Grab bars in the bathroom? yes      Handrails on the stairs?   yes      Poor lighting?   no 4.  Has the patient noticed any hearing difficulties?   no  Diet Recall and Exercise Regimen: eats what is served at group home  Fall Risk:  See screening under Objective Information  Depression Screen: unable to do it   Advanced Directives: A voluntary discussion about advance care planning  including the explanation and discussion of advance directives was discussed with the patient. Explanation about the health care proxy and living will was reviewed.  During this discussion, the patient was able to identify a health care proxy as his nephew - Seng Larch  Does patient have a HCPOA?    yes If yes, name and contact information:  Mehran Guderian  Does patient have a living will or MOST form?  yes  Cancer Screenings:  Skin: discussed atypical lesions Lung: Low Dose CT Chest recommended if Age 53-80 years, 30 pack-year currently smoking OR have quit w/in 15years. Patient does not qualify.   Prostate: sees urologist  Colon: not a candidate, secondary to prep  Additional Screenings: Hepatitis B/HIV/Syphillis:N/A Hepatitis C Screening:  Intimate Partner Violence:  AAA Screen: Men age 29 to 4 years if ever smoked recommended to get a one time AAA ultrasound screening exam. Patient does not qualify.  Objective:   Vitals: BP 130/74   Pulse 76   Temp 97.9 F (36.6 C)   SpO2 94%  There is no height or weight on file to calculate BMI.  Lab Results  Component Value Date   CHOL 72 11/30/2017   CHOL 124 11/27/2016   CHOL 130 06/23/2013   Lab Results  Component Value Date   HDL 34 (L) 11/30/2017   HDL 46 11/27/2016   HDL 50 06/23/2013   Lab Results  Component Value Date   LDLCALC 24 11/30/2017   LDLCALC 62 11/27/2016   LDLCALC 66 06/23/2013   Lab Results  Component Value Date   TRIG 64 11/30/2017   TRIG 81 11/27/2016   TRIG 72 06/23/2013   Lab Results  Component Value Date   CHOLHDL 2.1 11/30/2017   CHOLHDL 2.7 11/27/2016   No results found for: LDLDIRECT  No exam data present  Physical Exam Constitutional: Patient appears well-developed and well-nourished.  No distress.  HEENT: head atraumatic, normocephalic , he has strabismus, some injection of both conjunctiva  neck supple, throat within normal limits Cardiovascular: Normal rate, regular rhythm and normal  heart sounds.  No murmur heard. Trace  BLE edema. Pulmonary/Chest: Effort normal and breath sounds normal. No respiratory distress. Abdominal: Soft.  There is no tenderness. Muscular skeletal: wheel chair bound, atrophy hands and legs Psychiatric: Patient has a normal mood and affect, cooperative .   Cognitive Testing - 6-CIT      Not done because he is unable to answer  Fall Risk: Fall Risk  02/19/2018 11/30/2017 08/29/2017 07/02/2017 05/30/2017  Falls in the past year? Yes No No Yes Yes  Number falls in past yr: 2 or more - - 2 or more 2 or more  Injury with Fall? No - - Yes Yes  Risk Factor Category  High Fall Risk - - High Fall Risk High Fall Risk  Risk for fall due to : Impaired mobility;History of fall(s) - - - Impaired balance/gait;Impaired mobility  Follow up Falls prevention discussed - - - -    Depression Screen Depression screen [facility] 2/9 11/30/2017 07/02/2017 02/14/2017 02/14/2016 12/23/2015  Decreased Interest 0 0 0 0 0  Down, Depressed, Hopeless 0 1 0 0 0  PHQ - 2 Score 0 1 0 0 0  Altered sleeping 0 - - - -  Tired, decreased energy 0 - - - -  Change in appetite 0 - - - -  Feeling bad or failure about yourself  0 - - - -  Trouble concentrating 0 - - - -  Moving slowly or fidgety/restless 0 - - - -  Suicidal thoughts 0 - - - -  PHQ-9 Score 0 - - - -  Difficult doing work/chores Not difficult at all - - - -     Recent Results (from the past 2160 hour(s))  CBC with Differential/Platelet     Status: None   Collection Time: 11/30/17 10:40 AM  Result Value Ref Range   WBC 4.2 3.8 -  10.8 Thousand/uL   RBC 4.26 4.20 - 5.80 Million/uL   Hemoglobin 13.9 13.2 - 17.1 g/dL   HCT 40.2 38.5 - 50.0 %   MCV 94.4 80.0 - 100.0 fL   MCH 32.6 27.0 - 33.0 pg   MCHC 34.6 32.0 - 36.0 g/dL   RDW 12.4 11.0 - 15.0 %   Platelets 156 140 - 400 Thousand/uL   MPV 11.0 7.5 - 12.5 fL   Neutro Abs 2,806 1,500 - 7,800 cells/uL   Lymphs Abs 1,000 850 - 3,900 cells/uL   WBC mixed population 315 200  - 950 cells/uL   Eosinophils Absolute 59 15 - 500 cells/uL   Basophils Absolute 21 0 - 200 cells/uL   Neutrophils Relative % 66.8 %   Total Lymphocyte 23.8 %   Monocytes Relative 7.5 %   Eosinophils Relative 1.4 %   Basophils Relative 0.5 %  COMPLETE METABOLIC PANEL WITH GFR     Status: Abnormal   Collection Time: 11/30/17 10:40 AM  Result Value Ref Range   Glucose, Bld 92 65 - 99 mg/dL    Comment: .            Fasting reference interval .    BUN 17 7 - 25 mg/dL   Creat 0.72 0.70 - 1.18 mg/dL    Comment: For patients >52 years of age, the reference limit for Creatinine is approximately 13% higher for people identified as African-American. .    GFR, Est Non African American 91 > OR = 60 mL/min/1.62m2   GFR, Est African American 106 > OR = 60 mL/min/1.55m2   BUN/Creatinine Ratio NOT APPLICABLE 6 - 22 (calc)   Sodium 141 135 - 146 mmol/L   Potassium 4.5 3.5 - 5.3 mmol/L   Chloride 107 98 - 110 mmol/L   CO2 26 20 - 32 mmol/L   Calcium 9.3 8.6 - 10.3 mg/dL   Total Protein 6.8 6.1 - 8.1 g/dL   Albumin 4.1 3.6 - 5.1 g/dL   Globulin 2.7 1.9 - 3.7 g/dL (calc)   AG Ratio 1.5 1.0 - 2.5 (calc)   Total Bilirubin 0.6 0.2 - 1.2 mg/dL   Alkaline phosphatase (APISO) 133 (H) 40 - 115 U/L   AST 17 10 - 35 U/L   ALT 15 9 - 46 U/L  Lipid panel     Status: Abnormal   Collection Time: 11/30/17 10:40 AM  Result Value Ref Range   Cholesterol 72 <200 mg/dL   HDL 34 (L) >40 mg/dL   Triglycerides 64 <150 mg/dL   LDL Cholesterol (Calc) 24 mg/dL (calc)    Comment: Reference range: <100 . Desirable range <100 mg/dL for primary prevention;   <70 mg/dL for patients with CHD or diabetic patients  with > or = 2 CHD risk factors. Marland Kitchen LDL-C is now calculated using the Martin-Hopkins  calculation, which is a validated novel method providing  better accuracy than the Friedewald equation in the  estimation of LDL-C.  Cresenciano Genre et al. Annamaria Helling. 7026;378(58): 2061-2068   (http://education.[facility].com/faq/FAQ164)    Total CHOL/HDL Ratio 2.1 <5.0 (calc)   Non-HDL Cholesterol (Calc) 38 <130 mg/dL (calc)    Comment: For patients with diabetes plus 1 major ASCVD risk  factor, treating to a non-HDL-C goal of <100 mg/dL  (LDL-C of <70 mg/dL) is considered a therapeutic  option.   Hemoglobin A1c     Status: None   Collection Time: 11/30/17 10:40 AM  Result Value Ref Range   Hgb A1c MFr Bld 4.9 <5.7 %  of total Hgb    Comment: For the purpose of screening for the presence of diabetes: . <5.7%       Consistent with the absence of diabetes 5.7-6.4%    Consistent with increased risk for diabetes             (prediabetes) > or =6.5%  Consistent with diabetes . This assay result is consistent with a decreased risk of diabetes. . Currently, no consensus exists regarding use of hemoglobin A1c for diagnosis of diabetes in children. . According to American Diabetes Association (ADA) guidelines, hemoglobin A1c <7.0% represents optimal control in non-pregnant diabetic patients. Different metrics may apply to specific patient populations.  Standards of Medical Care in Diabetes(ADA). .    Mean Plasma Glucose 94 (calc)   eAG (mmol/L) 5.2 (calc)     Assessment & Plan:     1. Medicare annual wellness visit, subsequent   2. Need for influenza vaccination  - Flu vaccine HIGH DOSE PF (Fluzone High dose)     type dotphrase "dot"diagmed to refresh this list  Exercise Activities and Dietary recommendations  Continue PT to slow down progression of weakness  Discussed health benefits of physical activity, and encouraged him to engage in regular exercise appropriate for his age and condition.   Immunization History  Administered Date(s) Administered  . Influenza, High Dose Seasonal PF 02/11/2015, 02/14/2016, 02/14/2017, 02/19/2018  . Influenza,inj,Quad PF,6+ Mos 02/04/2013  . Influenza-Unspecified 12/31/2013, 02/11/2015  . Pneumococcal  Conjugate-13 06/22/2014, 06/22/2014  . Pneumococcal Polysaccharide-23 09/15/2009, 09/15/2009  . Tdap 09/15/2009, 09/15/2009    Health Maintenance  Topic Date Due  . TETANUS/TDAP  09/16/2019  . INFLUENZA VACCINE  Completed  . PNA vac Low Risk Adult  Completed     No orders of the defined types were placed in this encounter.   Current Outpatient Medications:  .  alfuzosin (UROXATRAL) 10 MG 24 hr tablet, TAKE 1 TABLET (10MG ) BY MOUTH ONCE DAILY WITH BREAKFAST*DO NOT CRUSH*, Disp: 28 tablet, Rfl: 5 .  ALPRAZolam (XANAX) 0.25 MG tablet, TAKE ONE TABLET BY MOUTH 2 TIMES A DAY AS NEEDED FOR ANXIETY (WHEN BLOOD PRESSURE IS VERY HIGH OR ANXIOUS), Disp: 20 tablet, Rfl: 0 .  AMITIZA 24 MCG capsule, TAKE ONE CAPSULE BY MOUTH TWO TIMES A DAY WITH A MEAL, Disp: 56 capsule, Rfl: 5 .  ARTIFICIAL TEAR SOLUTION OP, Apply 2 drops to eye 2 (two) times daily as needed (dry eyes)., Disp: , Rfl:  .  aspirin (ASPIRIN ADULT LOW STRENGTH) 81 MG EC tablet, TAKE ONE TABLET BY MOUTH EVERY DAY, Disp: 28 tablet, Rfl: 5 .  atorvastatin (LIPITOR) 40 MG tablet, Take 1 tablet (40 mg total) by mouth every evening., Disp: 30 tablet, Rfl: 5 .  betamethasone dipropionate (DIPROLENE) 0.05 % cream, Apply 1 application topically as needed (rash)., Disp: 45 g, Rfl: 0 .  Calcium Carb-Cholecalciferol 500-400 MG-UNIT TABS, TAKE ONE TABLET BY MOUTH 2 TIMES A DAY, Disp: 60 tablet, Rfl: 12 .  Cranberry Juice Powder 425 MG CAPS, TAKE ONE CAPSULE BY MOUTH EVERY DAY, Disp: 28 capsule, Rfl: 5 .  Polyethyl Glycol-Propyl Glycol (SYSTANE ULTRA) 0.4-0.3 % SOLN, Apply 1 drop to eye 2 (two) times daily. Both eyes, Disp: , Rfl:  .  sertraline (ZOLOFT) 100 MG tablet, Take 1 tablet (100 mg total) by mouth daily., Disp: 30 tablet, Rfl: 5 .  vitamin C (ASCORBIC ACID) 500 MG tablet, TAKE ONE TABLET BY MOUTH TWO TIMES A DAY, Disp: 59 tablet, Rfl: 0 .  Elastic Bandages &  Supports (MEDICAL COMPRESSION STOCKINGS) MISC, 1 Units by Does not apply route  daily., Disp: 2 each, Rfl: 0 .  Incontinence Supply Disposable (DEPEND SILHOUETTE BRIEFS L/XL) MISC, 1 each by Does not apply route 2 (two) times daily as needed., Disp: 60 each, Rfl: 5 There are no discontinued medications.  I have personally reviewed and addressed the Medicare Annual Wellness health risk assessment questionnaire and have noted the following in the patient's chart:  A.         Medical and social history & family history B.         Use of alcohol, tobacco or illicit drugs  C.         Current medications and supplements D.         Functional and Cognitive ability and status E.         Nutritional status F.         Physical activity G.        Advance directives H.         List of other physicians I.          Hospitalizations, surgeries, and ER visits in previous 12 months J.         [place] such as hearing and vision if needed, cognitive and depression L.         Referrals and appointments -none  In addition, I have reviewed and discussed with patient certain preventive protocols, quality metrics, and best practice recommendations. A written personalized care plan for preventive services as well as general preventive health recommendations were provided to patient.   See attached scanned questionnaire for additional information.

## 2018-02-21 ENCOUNTER — Other Ambulatory Visit: Payer: Self-pay | Admitting: Family Medicine

## 2018-02-26 DIAGNOSIS — H524 Presbyopia: Secondary | ICD-10-CM | POA: Diagnosis not present

## 2018-03-11 ENCOUNTER — Ambulatory Visit (INDEPENDENT_AMBULATORY_CARE_PROVIDER_SITE_OTHER): Payer: Medicare Other | Admitting: Vascular Surgery

## 2018-03-16 ENCOUNTER — Other Ambulatory Visit: Payer: Self-pay | Admitting: Family Medicine

## 2018-03-16 DIAGNOSIS — I359 Nonrheumatic aortic valve disorder, unspecified: Secondary | ICD-10-CM

## 2018-03-16 DIAGNOSIS — I2584 Coronary atherosclerosis due to calcified coronary lesion: Secondary | ICD-10-CM

## 2018-03-16 DIAGNOSIS — I700 Atherosclerosis of aorta: Secondary | ICD-10-CM

## 2018-03-16 DIAGNOSIS — I2510 Atherosclerotic heart disease of native coronary artery without angina pectoris: Secondary | ICD-10-CM

## 2018-03-18 ENCOUNTER — Other Ambulatory Visit: Payer: Self-pay

## 2018-03-18 ENCOUNTER — Ambulatory Visit (INDEPENDENT_AMBULATORY_CARE_PROVIDER_SITE_OTHER): Payer: Medicare Other | Admitting: Vascular Surgery

## 2018-03-18 MED ORDER — CALCIUM CARB-CHOLECALCIFEROL 500-400 MG-UNIT PO TABS: ORAL_TABLET | ORAL | 12 refills | Status: DC

## 2018-03-18 NOTE — Telephone Encounter (Signed)
Patient pharmacy is asking for a refill of his Calcium and Vit D supplement to be filled.

## 2018-03-22 ENCOUNTER — Ambulatory Visit (INDEPENDENT_AMBULATORY_CARE_PROVIDER_SITE_OTHER): Payer: Medicare Other | Admitting: Family Medicine

## 2018-03-22 ENCOUNTER — Encounter: Payer: Self-pay | Admitting: Family Medicine

## 2018-03-22 VITALS — BP 112/72 | HR 74 | Temp 98.1°F | Ht 67.0 in | Wt 190.0 lb

## 2018-03-22 DIAGNOSIS — R82998 Other abnormal findings in urine: Secondary | ICD-10-CM

## 2018-03-22 DIAGNOSIS — R8290 Unspecified abnormal findings in urine: Secondary | ICD-10-CM | POA: Diagnosis not present

## 2018-03-22 LAB — POCT URINALYSIS DIPSTICK
BILIRUBIN UA: NEGATIVE
Blood, UA: NEGATIVE
Glucose, UA: NEGATIVE
Ketones, UA: NEGATIVE
NITRITE UA: POSITIVE
PH UA: 8 (ref 5.0–8.0)
PROTEIN UA: NEGATIVE
Spec Grav, UA: 1.015 (ref 1.010–1.025)
UROBILINOGEN UA: NEGATIVE U/dL — AB

## 2018-03-22 MED ORDER — CIPROFLOXACIN HCL 250 MG PO TABS: 250.0000 mg | ORAL_TABLET | Freq: Two times a day (BID) | ORAL | 0 refills | Status: DC

## 2018-03-22 NOTE — Progress Notes (Signed)
Name: [name]   MRN: [id number]    DOB: January 05, 1941   Date:03/22/2018       Progress Note  Subjective  Chief Complaint  Chief Complaint  Patient presents with  . Urinary Tract Infection    Stong odor, dark in color, per caregiver pt has been very aggitated, noticed last week.    HPI  Urine odor: [name] has a history of kidney stones and recurrent UTI's , he lives in a group home and caregiver Vickii Chafe ) states his urine has an odor, he has been weak lately -needing assistance to transfer, he is also irritable . Symptoms started about one week ago. No fever, but he has been complaining of chills. No nausea, vomiting , no change in bowel movement.    Patient Active Problem List   Diagnosis Date Noted  . Renal mass 07/23/2017  . Ureteral calculus 07/23/2017  . PAD (peripheral artery disease) ([place]) 07/04/2017  . Aortic valve calcification 07/04/2017  . Coronary artery calcification 07/04/2017  . Hypertension 10/26/2016  . Late effect acute polio 05/17/2016  . Chronic constipation 11/19/2015  . Mild major depression ([place]) 11/19/2015  . Complicated grieving 16/10/3708  . Recurrent UTI 09/11/2015  . Adenocarcinoma of prostate ([place]) 03/02/2015  . Amyotrophia 03/02/2015  . Edema leg 03/02/2015  . Cerebral palsy ([place]) 03/02/2015  . Dyslipidemia 03/02/2015  . Dermatitis, eczematoid 03/02/2015  . 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 H/O acute poliomyelitis 03/02/2015  . Lymphedema 03/02/2015  . Mental retardation 03/02/2015  . Hypertrophy of nail 03/02/2015  . Adiposity 03/02/2015  . Absence of bladder continence 03/02/2015  . Chronic venous insufficiency 03/02/2015  . Avitaminosis D 03/02/2015  . Adynamia 03/02/2015  . Dependent on wheelchair 03/02/2015  . Kidney cysts 03/02/2015  . At risk for falling 03/02/2015  . Cerebral vascular disease 03/02/2015  . Strabismus 02/11/2015  . Urge incontinence of urine 02/11/2015  . Wheelchair dependence 02/11/2015  . Obesity  02/11/2015  . Urinary frequency 10/30/2014    Past Surgical History:  Procedure Laterality Date  . CYSTOSCOPY/URETEROSCOPY/HOLMIUM LASER/STENT PLACEMENT Right 08/14/2017   Procedure: CYSTOSCOPY/URETEROSCOPY/HOLMIUM LASER/STENT PLACEMENT;  Surgeon: Abbie Sons, MD;  Location: [facility] ORS;  Service: Urology;  Laterality: Right;  . leg circulation surgery Right   . PROSTATE SURGERY  11/10/2008   BRACHYTHERAPY    Family History  Problem Relation Age of Onset  . Heart attack Brother   . Heart disease Unknown     Social History   Socioeconomic History  . Marital status: Single    Spouse name: Not on file  . Number of children: Not on file  . Years of education: Not on file  . Highest education level: Not on file  Occupational History  . Not on file  Social Needs  . Financial resource strain: Not hard at all  . Food insecurity:    Worry: Never true    Inability: Never true  . Transportation needs:    Medical: No    Non-medical: No  Tobacco Use  . Smoking status: Never Smoker  . Smokeless tobacco: Never Used  Substance and Sexual Activity  . Alcohol use: No    Alcohol/week: 0.0 standard drinks  . Drug use: No  . Sexual activity: Not Currently  Lifestyle  . Physical activity:    Days per week: 0 days    Minutes per session: 0 min  . Stress: Not at all  Relationships  . Social connections:    Talks on phone: Never  Gets together: Never    Attends religious service: Never    Active member of club or organization: No    Attends meetings of clubs or organizations: Never    Relationship status: Never married  . Intimate partner violence:    Fear of current or ex partner: Patient refused    Emotionally abused: Patient refused    Physically abused: Patient refused    Forced sexual activity: Patient refused  Other Topics Concern  . Not on file  Social History Narrative   Lives at [facility] group home, 5 males.          Current Outpatient Medications:  .   alfuzosin (UROXATRAL) 10 MG 24 hr tablet, TAKE 1 TABLET (10MG ) BY MOUTH ONCE DAILY WITH BREAKFAST*DO NOT CRUSH*, Disp: 28 tablet, Rfl: 5 .  ALPRAZolam (XANAX) 0.25 MG tablet, TAKE ONE TABLET BY MOUTH 2 TIMES A DAY AS NEEDED FOR ANXIETY (WHEN BLOOD PRESSURE IS VERY HIGH OR ANXIOUS), Disp: 20 tablet, Rfl: 0 .  AMITIZA 24 MCG capsule, TAKE ONE CAPSULE BY MOUTH TWO TIMES A DAY WITH A MEAL, Disp: 56 capsule, Rfl: 5 .  ARTIFICIAL TEAR SOLUTION OP, Apply 2 drops to eye 2 (two) times daily as needed (dry eyes)., Disp: , Rfl:  .  aspirin (ASPIRIN ADULT LOW STRENGTH) 81 MG EC tablet, TAKE ONE TABLET BY MOUTH EVERY DAY, Disp: 28 tablet, Rfl: 5 .  atorvastatin (LIPITOR) 40 MG tablet, TAKE 1 TABLET BY MOUTH EACH EVENING, Disp: 31 tablet, Rfl: 0 .  betamethasone dipropionate (DIPROLENE) 0.05 % cream, Apply 1 application topically as needed (rash)., Disp: 45 g, Rfl: 0 .  Calcium Carb-Cholecalciferol 500-400 MG-UNIT TABS, TAKE ONE TABLET BY MOUTH 2 TIMES A DAY, Disp: 60 tablet, Rfl: 12 .  Cranberry Juice Powder 425 MG CAPS, TAKE ONE CAPSULE BY MOUTH EVERY DAY, Disp: 28 capsule, Rfl: 5 .  Elastic Bandages & Supports (MEDICAL COMPRESSION STOCKINGS) MISC, 1 Units by Does not apply route daily., Disp: 2 each, Rfl: 0 .  Incontinence Supply Disposable (DEPEND SILHOUETTE BRIEFS L/XL) MISC, 1 each by Does not apply route 2 (two) times daily as needed., Disp: 60 each, Rfl: 5 .  Polyethyl Glycol-Propyl Glycol (SYSTANE ULTRA) 0.4-0.3 % SOLN, Apply 1 drop to eye 2 (two) times daily. Both eyes, Disp: , Rfl:  .  sertraline (ZOLOFT) 100 MG tablet, Take 1 tablet (100 mg total) by mouth daily., Disp: 30 tablet, Rfl: 5 .  vitamin C (ASCORBIC ACID) 500 MG tablet, TAKE ONE TABLET BY MOUTH TWO TIMES A DAY, Disp: 56 tablet, Rfl: 10 .  ciprofloxacin (CIPRO) 250 MG tablet, Take 1 tablet (250 mg total) by mouth 2 (two) times daily., Disp: 10 tablet, Rfl: 0 .  sertraline (ZOLOFT) 100 MG tablet, TAKE 1 TABLET BY MOUTH ONCE DAILY (Patient not  taking: Reported on 03/22/2018), Disp: 31 tablet, Rfl: 0  No Known Allergies  I personally reviewed active problem list, medication list, allergies, family history, social history with the patient/caregiver today.   ROS  Ten systems reviewed and is negative except as mentioned in HPI   Objective  Vitals:   03/22/18 1056  BP: 112/72  Pulse: 74  Temp: 98.1 F (36.7 C)  SpO2: 99%  Weight: 190 lb (86.2 kg)  Height: 5\' 7"  (1.702 m)    Body mass index is 29.76 kg/m.  Physical Exam  Constitutional: Patient appears well-developed and well-nourished. Obese  No distress.  HEENT: head atraumatic, normocephalic, pupils equal and reactive to light, neck supple, throat  within normal limits Cardiovascular: Normal rate, regular rhythm and normal heart sounds.  No murmur heard. No BLE edema. Pulmonary/Chest: Effort normal and breath sounds normal. No respiratory distress. Abdominal: Soft.  There is no tenderness. Negative CVA tenderness.  Muscular : muscle atrophy, sitting on wheelchair.  Psychiatric: Awake, cooperative but not as talkative today    PHQ2/9: Depression screen [facility] 2/9 11/30/2017 07/02/2017 02/14/2017 02/14/2016 12/23/2015  Decreased Interest 0 0 0 0 0  Down, Depressed, Hopeless 0 1 0 0 0  PHQ - 2 Score 0 1 0 0 0  Altered sleeping 0 - - - -  Tired, decreased energy 0 - - - -  Change in appetite 0 - - - -  Feeling bad or failure about yourself  0 - - - -  Trouble concentrating 0 - - - -  Moving slowly or fidgety/restless 0 - - - -  Suicidal thoughts 0 - - - -  PHQ-9 Score 0 - - - -  Difficult doing work/chores Not difficult at all - - - -    Fall Risk: Fall Risk  03/22/2018 02/19/2018 11/30/2017 08/29/2017 07/02/2017  Falls in the past year? 0 Yes No No Yes  Number falls in past yr: - 2 or more - - 2 or more  Injury with Fall? - No - - Yes  Risk Factor Category  - High Fall Risk - - High Fall Risk  Risk for fall due to : - Impaired mobility;History of fall(s) - - -   Follow up - Falls prevention discussed - - -    Functional Status Survey: Is the patient deaf or have difficulty hearing?: No Does the patient have difficulty seeing, even when wearing glasses/contacts?: No Does the patient have difficulty concentrating, remembering, or making decisions?: Yes Does the patient have difficulty walking or climbing stairs?: Yes Does the patient have difficulty dressing or bathing?: Yes Does the patient have difficulty doing errands alone such as visiting a doctor's office or shopping?: Yes   Assessment & Plan  1. Abnormal urine odor  - Urine Culture - POCT urine qual dipstick blood - ciprofloxacin (CIPRO) 250 MG tablet; Take 1 tablet (250 mg total) by mouth 2 (two) times daily.  Dispense: 10 tablet; Refill: 0

## 2018-03-23 LAB — URINE CULTURE

## 2018-03-25 ENCOUNTER — Telehealth: Payer: Self-pay | Admitting: Family Medicine

## 2018-03-25 ENCOUNTER — Encounter (INDEPENDENT_AMBULATORY_CARE_PROVIDER_SITE_OTHER): Payer: Self-pay | Admitting: Vascular Surgery

## 2018-03-25 ENCOUNTER — Ambulatory Visit (INDEPENDENT_AMBULATORY_CARE_PROVIDER_SITE_OTHER): Payer: Medicare Other | Admitting: Vascular Surgery

## 2018-03-25 VITALS — BP 114/75 | HR 66 | Resp 15 | Wt 190.0 lb

## 2018-03-25 DIAGNOSIS — I10 Essential (primary) hypertension: Secondary | ICD-10-CM

## 2018-03-25 DIAGNOSIS — I2584 Coronary atherosclerosis due to calcified coronary lesion: Secondary | ICD-10-CM

## 2018-03-25 DIAGNOSIS — I890 Lymphedema, not elsewhere classified: Secondary | ICD-10-CM | POA: Diagnosis not present

## 2018-03-25 DIAGNOSIS — I872 Venous insufficiency (chronic) (peripheral): Secondary | ICD-10-CM | POA: Diagnosis not present

## 2018-03-25 DIAGNOSIS — I2510 Atherosclerotic heart disease of native coronary artery without angina pectoris: Secondary | ICD-10-CM

## 2018-03-25 NOTE — Telephone Encounter (Incomplete)
Copied from [place] [id number]. Topic: Quick Communication - Lab Results (Clinic Use ONLY) >> Mar 25, 2018  9:13 AM [place], [place] A, [place] wrote: {CHL AMB CRM LAB RESULTS:21245} >> Mar 25, 2018  9:14 AM [place], [place] A, [place] wrote: I tried to contact this patient's caretakers to review the results from his most recent labs, but there was no answer. A message was left for them to give [place] a call when they got the chance.   Please review when patient's caretaker calls back. Caretaker called back.  Please call [phone]

## 2018-03-25 NOTE — Telephone Encounter (Signed)
Attempted to return call to [phone] but no answer at this time and unable to leave message due to mailbox being full.

## 2018-03-25 NOTE — Progress Notes (Signed)
MRN : [id number]  [name] is a 77 y.o. (10-Nov-1940) male who presents with chief complaint of  Chief Complaint  Patient presents with  . Follow-up    61yr follow up  .  History of Present Illness:   The patient returns to the office for followup evaluation regarding leg swelling.  The swelling has persisted but with the lymph pump is much, much better controlled. The pain associated with swelling is essentially eliminated. There have not been any interval development of a ulcerations or wounds.  The patient denies problems with the pump, noting it is working well and the leggings are in good condition.  Since the previous visit the patient has been wearing graduated compression stockings and using the lymph pump on a routine basis and  has noted significant improvement in the lymphedema.   Patient stated the lymph pump has been a very positive factor in her care.   Current Meds  Medication Sig  . alfuzosin (UROXATRAL) 10 MG 24 hr tablet TAKE 1 TABLET (10MG ) BY MOUTH ONCE DAILY WITH BREAKFAST*DO NOT CRUSH*  . ALPRAZolam (XANAX) 0.25 MG tablet TAKE ONE TABLET BY MOUTH 2 TIMES A DAY AS NEEDED FOR ANXIETY (WHEN BLOOD PRESSURE IS VERY HIGH OR ANXIOUS)  . AMITIZA 24 MCG capsule TAKE ONE CAPSULE BY MOUTH TWO TIMES A DAY WITH A MEAL  . ARTIFICIAL TEAR SOLUTION OP Apply 2 drops to eye 2 (two) times daily as needed (dry eyes).  Marland Kitchen aspirin (ASPIRIN ADULT LOW STRENGTH) 81 MG EC tablet TAKE ONE TABLET BY MOUTH EVERY DAY  . atorvastatin (LIPITOR) 40 MG tablet TAKE 1 TABLET BY MOUTH EACH EVENING  . betamethasone dipropionate (DIPROLENE) 0.05 % cream Apply 1 application topically as needed (rash).  . Calcium Carb-Cholecalciferol 500-400 MG-UNIT TABS TAKE ONE TABLET BY MOUTH 2 TIMES A DAY  . ciprofloxacin (CIPRO) 250 MG tablet Take 1 tablet (250 mg total) by mouth 2 (two) times daily.  . Cranberry Juice Powder 425 MG CAPS TAKE ONE CAPSULE BY MOUTH EVERY DAY  . Elastic Bandages & Supports  (MEDICAL COMPRESSION STOCKINGS) MISC 1 Units by Does not apply route daily.  . Incontinence Supply Disposable (DEPEND SILHOUETTE BRIEFS L/XL) MISC 1 each by Does not apply route 2 (two) times daily as needed.  Vladimir Faster Glycol-Propyl Glycol (SYSTANE ULTRA) 0.4-0.3 % SOLN Apply 1 drop to eye 2 (two) times daily. Both eyes  . sertraline (ZOLOFT) 100 MG tablet Take 1 tablet (100 mg total) by mouth daily.  . vitamin C (ASCORBIC ACID) 500 MG tablet TAKE ONE TABLET BY MOUTH TWO TIMES A DAY    Past Medical History:  Diagnosis Date  . Anxiety   . Atrophy, disuse, muscle   . BPH (benign prostatic hyperplasia)   . Cerebral palsy ([place])   . Eczema   . Elevated lipids   . Elevated PSA   . Exotropia   . Hematuria, gross   . Hyperlipidemia   . Incontinence   . Kidney stone   . Lower extremity edema    lymph drainage problems  . Lower urinary tract symptoms   . Lymphedema   . Mental retardation   . Over weight   . Prostate cancer ([place])   . Urinary frequency   . Urinary urgency   . UTI (urinary tract infection)   . Venous insufficiency   . Vitamin D deficiency   . Wheelchair dependence     Past Surgical History:  Procedure Laterality Date  . CYSTOSCOPY/URETEROSCOPY/HOLMIUM LASER/STENT  PLACEMENT Right 08/14/2017   Procedure: CYSTOSCOPY/URETEROSCOPY/HOLMIUM LASER/STENT PLACEMENT;  Surgeon: Abbie Sons, MD;  Location: [facility] ORS;  Service: Urology;  Laterality: Right;  . leg circulation surgery Right   . PROSTATE SURGERY  11/10/2008   BRACHYTHERAPY    Social History Social History   Tobacco Use  . Smoking status: Never Smoker  . Smokeless tobacco: Never Used  Substance Use Topics  . Alcohol use: No    Alcohol/week: 0.0 standard drinks  . Drug use: No    Family History Family History  Problem Relation Age of Onset  . Heart attack Brother   . Heart disease Unknown     No Known Allergies   REVIEW OF SYSTEMS (Negative unless checked)  Constitutional: [] Weight loss   [] Fever  [] Chills Cardiac: [] Chest pain   [] Chest pressure   [] Palpitations   [] Shortness of breath when laying flat   [] Shortness of breath with exertion. Vascular:  [] Pain in legs with walking   [] Pain in legs at rest  [] History of DVT   [] Phlebitis   [x] Swelling in legs   [] Varicose veins   [] Non-healing ulcers Pulmonary:   [] Uses home oxygen   [] Productive cough   [] Hemoptysis   [] Wheeze  [] COPD   [] Asthma Neurologic:  [] Dizziness   [] Seizures   [] History of stroke   [] History of TIA  [] Aphasia   [] Vissual changes   [] Weakness or numbness in arm   [x] Weakness or numbness in leg Musculoskeletal:   [] Joint swelling   [] Joint pain   [] Low back pain Hematologic:  [] Easy bruising  [] Easy bleeding   [] Hypercoagulable state   [] Anemic Gastrointestinal:  [] Diarrhea   [] Vomiting  [] Gastroesophageal reflux/heartburn   [] Difficulty swallowing. Genitourinary:  [] Chronic kidney disease   [] Difficult urination  [] Frequent urination   [] Blood in urine Skin:  [] Rashes   [] Ulcers  Psychological:  [] History of anxiety   []  History of major depression.  Physical Examination  Vitals:   03/25/18 1050  BP: 114/75  Pulse: 66  Resp: 15  Weight: 190 lb (86.2 kg)   Body mass index is 29.76 kg/m. Gen: Seen in a wheel chair/WN, NAD, but very weak Head: [place]/AT, No temporalis wasting.  Ear/Nose/Throat: Hearing grossly intact, nares w/o erythema or drainage Eyes: PER, EOMI, sclera nonicteric.  Neck: Supple, no large masses.   Pulmonary:  Good air movement, no audible wheezing bilaterally, no use of accessory muscles.  Cardiac: RRR, no JVD Vascular: scattered varicosities present bilaterally.  Mild venous stasis changes to the legs bilaterally.  2+ soft pitting edema Vessel Right Left  Radial Palpable Palpable  PT Palpable Palpable  DP Palpable Palpable  Gastrointestinal: Non-distended. No guarding/no peritoneal signs.  Musculoskeletal: M/S 5/5 throughout.  No deformity or atrophy.  Neurologic: CN 2-12  intact. Symmetrical.  Speech is fluent. Motor exam as listed above. Psychiatric: Judgment intact, Mood & affect appropriate for pt's clinical situation. Dermatologic: No rashes or ulcers noted.  No changes consistent with cellulitis. Lymph : No lichenification or skin changes of chronic lymphedema.  CBC Lab Results  Component Value Date   WBC 4.2 11/30/2017   HGB 13.9 11/30/2017   HCT 40.2 11/30/2017   MCV 94.4 11/30/2017   PLT 156 11/30/2017    BMET    Component Value Date/Time   NA 141 11/30/2017 1040   NA 139 02/11/2015 1404   NA 139 08/14/2013 1618   K 4.5 11/30/2017 1040   K 3.8 08/14/2013 1618   CL 107 11/30/2017 1040   CL 108 (H) 08/14/2013  1618   CO2 26 11/30/2017 1040   CO2 28 08/14/2013 1618   GLUCOSE 92 11/30/2017 1040   GLUCOSE 99 08/14/2013 1618   BUN 17 11/30/2017 1040   BUN 11 02/11/2015 1404   BUN 14 08/14/2013 1618   CREATININE 0.72 11/30/2017 1040   CALCIUM 9.3 11/30/2017 1040   CALCIUM 8.4 (L) 08/14/2013 1618   GFRNONAA 91 11/30/2017 1040   GFRAA 106 11/30/2017 1040   CrCl cannot be calculated (Patient's most recent lab result is older than the maximum 21 days allowed.).  COAG No results found for: INR, PROTIME  Radiology No results found.    Assessment/Plan 1. Chronic venous insufficiency  No surgery or intervention at this point in time.    I have reviewed my discussion with the patient regarding lymphedema and why it  causes symptoms.  Patient will continue wearing graduated compression stockings class 1 (20-30 mmHg) on a daily basis a prescription was given. The patient is reminded to put the stockings on first thing in the morning and removing them in the evening. The patient is instructed specifically not to sleep in the stockings.   In addition, behavioral modification throughout the day will be continued.  This will include frequent elevation (such as in a recliner), use of over the counter pain medications as needed and exercise such  as walking.  I have reviewed systemic causes for chronic edema such as liver, kidney and cardiac etiologies and there does not appear to be any significant changes in these organ systems over the past year.  The patient is under the impression that these organ systems are all stable and unchanged.    The patient will continue aggressive use of the  lymph pump.  This will continue to improve the edema control and prevent sequela such as ulcers and infections.   The patient will follow-up with me on an annual basis.    2. Lymphedema  No surgery or intervention at this point in time.    I have reviewed my discussion with the patient regarding lymphedema and why it  causes symptoms.  Patient will continue wearing graduated compression stockings class 1 (20-30 mmHg) on a daily basis a prescription was given. The patient is reminded to put the stockings on first thing in the morning and removing them in the evening. The patient is instructed specifically not to sleep in the stockings.   In addition, behavioral modification throughout the day will be continued.  This will include frequent elevation (such as in a recliner), use of over the counter pain medications as needed and exercise such as walking.  I have reviewed systemic causes for chronic edema such as liver, kidney and cardiac etiologies and there does not appear to be any significant changes in these organ systems over the past year.  The patient is under the impression that these organ systems are all stable and unchanged.    The patient will continue aggressive use of the  lymph pump.  This will continue to improve the edema control and prevent sequela such as ulcers and infections.   The patient will follow-up with me on an annual basis.    3. Essential hypertension Continue antihypertensive medications as already ordered, these medications have been reviewed and there are no changes at this time.   4. Coronary artery  calcification Continue cardiac and antihypertensive medications as already ordered and reviewed, no changes at this time.  Continue statin as ordered and reviewed, no changes at this time  Nitrates PRN for chest pain     Hortencia Pilar, MD  03/25/2018 11:12 AM

## 2018-03-26 NOTE — Telephone Encounter (Signed)
Patient's caretaker returning call for results. Please give [name] a call back @ [phone]

## 2018-03-27 NOTE — Telephone Encounter (Signed)
Copied from [place] [id number]. Topic: Quick Communication - Lab Results (Clinic Use ONLY) >> Mar 27, 2018  8:25 AM Oneta Rack wrote: [name] Human name: [name]  Relation to pt: [place]  Call back number: [phone]  Pharmacy:  Reason for call:  Returning call regarding lab results

## 2018-03-27 NOTE — Telephone Encounter (Signed)
Please review and make sure this is what you want

## 2018-03-27 NOTE — Telephone Encounter (Signed)
Pt's caregiver given results per Dr Ancil Boozer, "Contamination, please check on patient and see if symptoms better"; she says that per staff the pt appears to be better; she would like to know if Dr Ancil Boozer would like for the specimen to be recollected; spoke with Kristeen Miss at [facility] she says that since Dr Ancil Boozer did not leave a note about recollection and the pt is doing better, recollection is not necessary; relayed information to [name] and she verbalized understanding; also see result note dated 03/27/18.

## 2018-03-27 NOTE — Telephone Encounter (Deleted)
Oneta Rack 03/27/2018 08:25 AM  Summary: lab results    Caller name: [name]  Relation to pt: [place]  Call back number: [phone]    Reason for call:  Returning call regarding lab results

## 2018-04-10 ENCOUNTER — Ambulatory Visit: Payer: Medicare Other | Admitting: Family Medicine

## 2018-04-18 ENCOUNTER — Other Ambulatory Visit: Payer: Self-pay | Admitting: Family Medicine

## 2018-04-18 DIAGNOSIS — I2510 Atherosclerotic heart disease of native coronary artery without angina pectoris: Secondary | ICD-10-CM

## 2018-04-18 DIAGNOSIS — I359 Nonrheumatic aortic valve disorder, unspecified: Secondary | ICD-10-CM

## 2018-04-18 DIAGNOSIS — I700 Atherosclerosis of aorta: Secondary | ICD-10-CM

## 2018-04-18 DIAGNOSIS — I2584 Coronary atherosclerosis due to calcified coronary lesion: Secondary | ICD-10-CM

## 2018-04-18 NOTE — Telephone Encounter (Signed)
Refill request for general medication. Zoloft and Atorvastatin   Last office visit 03/22/2018   Follow up on 02/21/2019

## 2018-04-22 DIAGNOSIS — I872 Venous insufficiency (chronic) (peripheral): Secondary | ICD-10-CM | POA: Diagnosis not present

## 2018-05-10 DIAGNOSIS — I872 Venous insufficiency (chronic) (peripheral): Secondary | ICD-10-CM | POA: Diagnosis not present

## 2018-05-14 DIAGNOSIS — G809 Cerebral palsy, unspecified: Secondary | ICD-10-CM | POA: Diagnosis not present

## 2018-05-15 ENCOUNTER — Encounter: Payer: Self-pay | Admitting: Family Medicine

## 2018-05-15 ENCOUNTER — Other Ambulatory Visit
Admission: RE | Admit: 2018-05-15 | Discharge: 2018-05-15 | Disposition: A | Discharge status: Home / Self Care | Payer: Medicare Other | Source: Ambulatory Visit | Attending: Family Medicine | Admitting: Family Medicine

## 2018-05-15 ENCOUNTER — Ambulatory Visit (INDEPENDENT_AMBULATORY_CARE_PROVIDER_SITE_OTHER): Payer: Medicare Other | Admitting: Family Medicine

## 2018-05-15 VITALS — BP 114/73 | HR 84 | Temp 98.9°F | Resp 16 | Ht 68.0 in | Wt 190.0 lb

## 2018-05-15 DIAGNOSIS — R066 Hiccough: Secondary | ICD-10-CM

## 2018-05-15 DIAGNOSIS — N3001 Acute cystitis with hematuria: Secondary | ICD-10-CM

## 2018-05-15 LAB — POCT URINALYSIS DIPSTICK
Bilirubin, UA: NEGATIVE
Glucose, UA: NEGATIVE
Ketones, UA: NEGATIVE
NITRITE UA: POSITIVE
PH UA: 5 (ref 5.0–8.0)
PROTEIN UA: POSITIVE — AB
Spec Grav, UA: 1.02 (ref 1.010–1.025)
UROBILINOGEN UA: 0.2 U/dL

## 2018-05-15 MED ORDER — SULFAMETHOXAZOLE-TRIMETHOPRIM 800-160 MG PO TABS: 1.0000 | ORAL_TABLET | Freq: Two times a day (BID) | ORAL | 0 refills | Status: DC

## 2018-05-15 MED ORDER — CHLORPROMAZINE HCL 25 MG PO TABS: 25.0000 mg | ORAL_TABLET | Freq: Three times a day (TID) | ORAL | 0 refills | Status: DC

## 2018-05-15 NOTE — Addendum Note (Signed)
Addended by: Hubbard Hartshorn on: 05/15/2018 10:06 AM   Modules accepted: Orders

## 2018-05-15 NOTE — Progress Notes (Signed)
Name: [name]   MRN: [id number]    DOB: Nov 06, 1940   Date:05/15/2018       Progress Note  Subjective  Chief Complaint  Chief Complaint  Patient presents with  . Urinary Tract Infection    weak, dark urine, for 1 week  . Hiccups    HPI  PT presents, with Peggy from [name] who provide most of the history, with concern for the following:  Hiccups: Started last night - was able to sleep through the night, but hiccups restarted this morning after breakfast.  Denies abdominal pain, chest pain, no shortness of breath.  UTI: Has been feeling weak for about 7 days, foul smelling urine.  He has had several UTI's in the past - most recent was in November 2019 and was treated with Cipro.  Denies Abdominal pain, flank pain/back pain.  Does have hx kidney stone in the past - had seen Dr. Bernardo Heater for this in the past.  Today his urine is foul smelling, dark yellow/orange (NOT tea colored), +nitrites, protein, blood, and leukocytes.  We will treat with bactrim.  Patient Active Problem List   Diagnosis Date Noted  . Renal mass 07/23/2017  . Ureteral calculus 07/23/2017  . PAD (peripheral artery disease) ([place]) 07/04/2017  . Aortic valve calcification 07/04/2017  . Coronary artery calcification 07/04/2017  . Hypertension 10/26/2016  . Late effect acute polio 05/17/2016  . Chronic constipation 11/19/2015  . Mild major depression ([place]) 11/19/2015  . Complicated grieving 31/51/7616  . Recurrent UTI 09/11/2015  . Adenocarcinoma of prostate ([place]) 03/02/2015  . Amyotrophia 03/02/2015  . Edema leg 03/02/2015  . Cerebral palsy ([place]) 03/02/2015  . Dyslipidemia 03/02/2015  . Dermatitis, eczematoid 03/02/2015  . 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 H/O acute poliomyelitis 03/02/2015  . Lymphedema 03/02/2015  . Mental retardation 03/02/2015  . Hypertrophy of nail 03/02/2015  . Adiposity 03/02/2015  . Absence of bladder continence 03/02/2015  . Chronic venous insufficiency 03/02/2015  .  Avitaminosis D 03/02/2015  . Adynamia 03/02/2015  . Dependent on wheelchair 03/02/2015  . Kidney cysts 03/02/2015  . At risk for falling 03/02/2015  . Cerebral vascular disease 03/02/2015  . Strabismus 02/11/2015  . Urge incontinence of urine 02/11/2015  . Wheelchair dependence 02/11/2015  . Obesity 02/11/2015  . Urinary frequency 10/30/2014    Social History   Tobacco Use  . Smoking status: Never Smoker  . Smokeless tobacco: Never Used  Substance Use Topics  . Alcohol use: No    Alcohol/week: 0.0 standard drinks     Current Outpatient Medications:  .  alfuzosin (UROXATRAL) 10 MG 24 hr tablet, TAKE 1 TABLET (10MG ) BY MOUTH ONCE DAILY WITH BREAKFAST*DO NOT CRUSH*, Disp: 28 tablet, Rfl: 5 .  ALPRAZolam (XANAX) 0.25 MG tablet, TAKE ONE TABLET BY MOUTH 2 TIMES A DAY AS NEEDED FOR ANXIETY (WHEN BLOOD PRESSURE IS VERY HIGH OR ANXIOUS), Disp: 20 tablet, Rfl: 0 .  AMITIZA 24 MCG capsule, TAKE ONE CAPSULE BY MOUTH TWO TIMES A DAY WITH A MEAL, Disp: 56 capsule, Rfl: 5 .  ARTIFICIAL TEAR SOLUTION OP, Apply 2 drops to eye 2 (two) times daily as needed (dry eyes)., Disp: , Rfl:  .  aspirin (ASPIRIN ADULT LOW STRENGTH) 81 MG EC tablet, TAKE ONE TABLET BY MOUTH EVERY DAY, Disp: 28 tablet, Rfl: 5 .  atorvastatin (LIPITOR) 40 MG tablet, TAKE 1 TABLET BY MOUTH EACH EVENING, Disp: 28 tablet, Rfl: 10 .  betamethasone dipropionate (DIPROLENE) 0.05 % cream, Apply 1 application topically  as needed (rash)., Disp: 45 g, Rfl: 0 .  Calcium Carb-Cholecalciferol 500-400 MG-UNIT TABS, TAKE ONE TABLET BY MOUTH 2 TIMES A DAY, Disp: 60 tablet, Rfl: 12 .  ciprofloxacin (CIPRO) 250 MG tablet, Take 1 tablet (250 mg total) by mouth 2 (two) times daily., Disp: 10 tablet, Rfl: 0 .  Cranberry Juice Powder 425 MG CAPS, TAKE ONE CAPSULE BY MOUTH EVERY DAY, Disp: 28 capsule, Rfl: 5 .  Elastic Bandages & Supports (MEDICAL COMPRESSION STOCKINGS) MISC, 1 Units by Does not apply route daily., Disp: 2 each, Rfl: 0 .   Incontinence Supply Disposable (DEPEND SILHOUETTE BRIEFS L/XL) MISC, 1 each by Does not apply route 2 (two) times daily as needed., Disp: 60 each, Rfl: 5 .  Polyethyl Glycol-Propyl Glycol (SYSTANE ULTRA) 0.4-0.3 % SOLN, Apply 1 drop to eye 2 (two) times daily. Both eyes, Disp: , Rfl:  .  sertraline (ZOLOFT) 100 MG tablet, TAKE 1 TABLET BY MOUTH ONCE DAILY, Disp: 31 tablet, Rfl: 10 .  vitamin C (ASCORBIC ACID) 500 MG tablet, TAKE ONE TABLET BY MOUTH TWO TIMES A DAY, Disp: 56 tablet, Rfl: 10  No Known Allergies  I personally reviewed active problem list, medication list, allergies, notes from last encounter, lab results with the patient/caregiver today.  ROS  Ten systems reviewed and is negative except as mentioned in HPI.  Objective  Vitals:   05/15/18 0915  BP: 114/73  Pulse: 84  Resp: 16  Temp: 98.9 F (37.2 C)  TempSrc: Oral  SpO2: 99%  Weight: 190 lb (86.2 kg)  Height: 5\' 8"  (1.727 m)   Body mass index is 28.89 kg/m.  Nursing Note and Vital Signs reviewed.  Physical Exam  Constitutional: Patient appears well-developed and well-nourished. No distress.  HEENT: head atraumatic, normocephalic Cardiovascular: Normal rate, regular rhythm and normal heart sounds.  No murmur heard. No BLE edema. Pulmonary/Chest: Effort normal and breath sounds clear bilaterally. No respiratory distress. Abdominal: Soft, bowel sounds normal, there is no tenderness, no HSM.  No CVA tenderness.  Psychiatric: Patient has a normal mood and affect. behavior is normal. Judgment and thought content normal. Neurologic: At baseline per Peggy with [name].   Results for orders placed or performed in visit on 05/15/18 (from the past 72 hour(s))  POCT urinalysis dipstick     Status: Abnormal   Collection Time: 05/15/18  9:34 AM  Result Value Ref Range   Color, UA murky    Clarity, UA cloudy    Glucose, UA Negative Negative   Bilirubin, UA negative    Ketones, UA negative    Spec Grav, UA 1.020  1.010 - 1.025   Blood, UA large    pH, UA 5.0 5.0 - 8.0   Protein, UA Positive (A) Negative   Urobilinogen, UA 0.2 0.2 or 1.0 E.U./dL   Nitrite, UA positive    Leukocytes, UA Large (3+) (A) Negative   Appearance dark    Odor strong     Assessment & Plan  1. Acute cystitis with hematuria - Per Peggy with [name], he is not acting the way that he did when he had kidney stone in the past, she also notes his urine was much darker at that time.  I advised to go to ER if worsening symptoms, expression of pain, decrease in mental status, or if fever develops.  - POCT urinalysis dipstick - sulfamethoxazole-trimethoprim (BACTRIM DS,SEPTRA DS) 800-160 MG tablet; Take 1 tablet by mouth 2 (two) times daily for 7 days.  Dispense: 14 tablet;  Refill: 0 - Urine Culture  2. Intractable hiccups - Likely due to positioning - pt is leaning forward more in the last several days.  Advised may try 1 Xanax dose to see if this will help relax him enough to take deep breaths.

## 2018-05-16 ENCOUNTER — Encounter: Payer: Self-pay | Admitting: Emergency Medicine

## 2018-05-16 ENCOUNTER — Other Ambulatory Visit: Payer: Self-pay

## 2018-05-16 ENCOUNTER — Ambulatory Visit
Admission: EM | Admit: 2018-05-16 | Discharge: 2018-05-16 | Disposition: A | Discharge status: Home / Self Care | Payer: Medicare Other | Attending: Family Medicine | Admitting: Family Medicine

## 2018-05-16 DIAGNOSIS — R066 Hiccough: Secondary | ICD-10-CM | POA: Insufficient documentation

## 2018-05-16 LAB — URINE CULTURE

## 2018-05-16 NOTE — ED Triage Notes (Signed)
Pt has h/o UTI's. He tested positive for UTI by his PCP on 05/15/18 and given bactrim. He was also treated for hiccups with chlorpromazine. Caregiver is concerned about him taking the chlorpromazine and it making him too drowsy. His hiccups have resolved. Caregivers are concerned about him leaning more than normal.  He has an appt in the morning with his PCP.

## 2018-05-16 NOTE — Discharge Instructions (Signed)
Please keep a close eye.  Monitor for changes.  If worsens, develops fever, behavioral changes, or hiccups return take him to the hospital.  Dr. Lacinda Axon

## 2018-05-17 ENCOUNTER — Ambulatory Visit (INDEPENDENT_AMBULATORY_CARE_PROVIDER_SITE_OTHER): Payer: Medicare Other | Admitting: Nurse Practitioner

## 2018-05-17 ENCOUNTER — Encounter: Payer: Self-pay | Admitting: Nurse Practitioner

## 2018-05-17 VITALS — BP 110/72 | HR 83 | Temp 97.5°F | Resp 16 | Ht 67.0 in | Wt 189.9 lb

## 2018-05-17 DIAGNOSIS — R066 Hiccough: Secondary | ICD-10-CM | POA: Diagnosis not present

## 2018-05-17 DIAGNOSIS — N3001 Acute cystitis with hematuria: Secondary | ICD-10-CM

## 2018-05-17 NOTE — Progress Notes (Signed)
Name: [name]   MRN: [id number]    DOB: [birth year]   Date:05/17/2018       Progress Note  Subjective  Chief Complaint  Chief Complaint  Patient presents with  . Follow-up    2 day - sx has improved    HPI Patient presents for follow up on UTI and hiccups was seen in office 2 days ago and rx bactrim, hiccups resolved in office with uvula stimulation but was prescribed chlorpromazine if they returned. They went to the urgent care yesterday because his hiccups had returned and caregiver was concerned the chlorpromazine was making the patient too drowsy. Urine culture recommended re-collection due to multiple species present.   Patient Active Problem List   Diagnosis Date Noted  . Renal mass 07/23/2017  . Ureteral calculus 07/23/2017  . PAD (peripheral artery disease) ([place]) 07/04/2017  . Aortic valve calcification 07/04/2017  . Coronary artery calcification 07/04/2017  . Hypertension 10/26/2016  . Late effect acute polio 05/17/2016  . Chronic constipation 11/19/2015  . Mild major depression ([place]) 11/19/2015  . Complicated grieving 98/33/8250  . Recurrent UTI 09/11/2015  . Adenocarcinoma of prostate ([place]) 03/02/2015  . Amyotrophia 03/02/2015  . Edema leg 03/02/2015  . Cerebral palsy ([place]) 03/02/2015  . Dyslipidemia 03/02/2015  . Dermatitis, eczematoid 03/02/2015  . 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 H/O acute poliomyelitis 03/02/2015  . Lymphedema 03/02/2015  . Mental retardation 03/02/2015  . Hypertrophy of nail 03/02/2015  . Adiposity 03/02/2015  . Absence of bladder continence 03/02/2015  . Chronic venous insufficiency 03/02/2015  . Avitaminosis D 03/02/2015  . Adynamia 03/02/2015  . Dependent on wheelchair 03/02/2015  . Kidney cysts 03/02/2015  . At risk for falling 03/02/2015  . Cerebral vascular disease 03/02/2015  . Strabismus 02/11/2015  . Urge incontinence of urine 02/11/2015  . Wheelchair dependence 02/11/2015  . Obesity 02/11/2015  . Urinary frequency  10/30/2014    Past Medical History:  Diagnosis Date  . Anxiety   . Atrophy, disuse, muscle   . BPH (benign prostatic hyperplasia)   . Cerebral palsy ([place])   . Eczema   . Elevated lipids   . Elevated PSA   . Exotropia   . Hematuria, gross   . Hyperlipidemia   . Incontinence   . Kidney stone   . Lower extremity edema    lymph drainage problems  . Lower urinary tract symptoms   . Lymphedema   . Mental retardation   . Over weight   . Prostate cancer ([place])   . Urinary frequency   . Urinary urgency   . UTI (urinary tract infection)   . Venous insufficiency   . Vitamin D deficiency   . Wheelchair dependence     Past Surgical History:  Procedure Laterality Date  . CYSTOSCOPY/URETEROSCOPY/HOLMIUM LASER/STENT PLACEMENT Right 08/14/2017   Procedure: CYSTOSCOPY/URETEROSCOPY/HOLMIUM LASER/STENT PLACEMENT;  Surgeon: Abbie Sons, MD;  Location: [facility] ORS;  Service: Urology;  Laterality: Right;  . leg circulation surgery Right   . PROSTATE SURGERY  11/10/2008   BRACHYTHERAPY    Social History   Tobacco Use  . Smoking status: Never Smoker  . Smokeless tobacco: Never Used  Substance Use Topics  . Alcohol use: No    Alcohol/week: 0.0 standard drinks     Current Outpatient Medications:  .  alfuzosin (UROXATRAL) 10 MG 24 hr tablet, TAKE 1 TABLET (10MG ) BY MOUTH ONCE DAILY WITH BREAKFAST*DO NOT CRUSH*, Disp: 28 tablet, Rfl: 5 .  ALPRAZolam (XANAX) 0.25 MG tablet, TAKE ONE TABLET  BY MOUTH 2 TIMES A DAY AS NEEDED FOR ANXIETY (WHEN BLOOD PRESSURE IS VERY HIGH OR ANXIOUS), Disp: 20 tablet, Rfl: 0 .  AMITIZA 24 MCG capsule, TAKE ONE CAPSULE BY MOUTH TWO TIMES A DAY WITH A MEAL, Disp: 56 capsule, Rfl: 5 .  ARTIFICIAL TEAR SOLUTION OP, Apply 2 drops to eye 2 (two) times daily as needed (dry eyes)., Disp: , Rfl:  .  aspirin (ASPIRIN ADULT LOW STRENGTH) 81 MG EC tablet, TAKE ONE TABLET BY MOUTH EVERY DAY, Disp: 28 tablet, Rfl: 5 .  atorvastatin (LIPITOR) 40 MG tablet, TAKE 1 TABLET BY  MOUTH EACH EVENING, Disp: 28 tablet, Rfl: 10 .  betamethasone dipropionate (DIPROLENE) 0.05 % cream, Apply 1 application topically as needed (rash)., Disp: 45 g, Rfl: 0 .  Calcium Carb-Cholecalciferol 500-400 MG-UNIT TABS, TAKE ONE TABLET BY MOUTH 2 TIMES A DAY, Disp: 60 tablet, Rfl: 12 .  chlorproMAZINE (THORAZINE) 25 MG tablet, Take 1 tablet (25 mg total) by mouth 3 (three) times daily for 5 days., Disp: 15 tablet, Rfl: 0 .  Cranberry Juice Powder 425 MG CAPS, TAKE ONE CAPSULE BY MOUTH EVERY DAY, Disp: 28 capsule, Rfl: 5 .  Elastic Bandages & Supports (MEDICAL COMPRESSION STOCKINGS) MISC, 1 Units by Does not apply route daily., Disp: 2 each, Rfl: 0 .  Incontinence Supply Disposable (DEPEND SILHOUETTE BRIEFS L/XL) MISC, 1 each by Does not apply route 2 (two) times daily as needed., Disp: 60 each, Rfl: 5 .  Polyethyl Glycol-Propyl Glycol (SYSTANE ULTRA) 0.4-0.3 % SOLN, Apply 1 drop to eye 2 (two) times daily. Both eyes, Disp: , Rfl:  .  sertraline (ZOLOFT) 100 MG tablet, TAKE 1 TABLET BY MOUTH ONCE DAILY, Disp: 31 tablet, Rfl: 10 .  sulfamethoxazole-trimethoprim (BACTRIM DS,SEPTRA DS) 800-160 MG tablet, Take 1 tablet by mouth 2 (two) times daily for 7 days., Disp: 14 tablet, Rfl: 0 .  vitamin C (ASCORBIC ACID) 500 MG tablet, TAKE ONE TABLET BY MOUTH TWO TIMES A DAY, Disp: 56 tablet, Rfl: 10  No Known Allergies  ROS   No other specific complaints in a complete review of systems (except as listed in HPI above).  Objective  Vitals:   05/17/18 0911  BP: 110/72  Pulse: 83  Temp: (!) 97.5 F (36.4 C)  TempSrc: Axillary  Weight: 189 lb 14.4 oz (86.1 kg)  Height: 5\' 7"  (1.702 m)    Body mass index is 29.74 kg/m.  Nursing Note and Vital Signs reviewed.  Physical Exam Constitutional:      General: He is not in acute distress.    Appearance: Normal appearance. He is not ill-appearing.  Cardiovascular:     Rate and Rhythm: Normal rate.  Pulmonary:     Effort: Pulmonary effort is  normal.     Breath sounds: Normal breath sounds.  Abdominal:     General: Abdomen is flat. Bowel sounds are normal.     Palpations: Abdomen is soft.     Tenderness: There is no abdominal tenderness. There is no right CVA tenderness or left CVA tenderness.  Musculoskeletal:     Comments: Patient in wheelchair   Skin:    General: Skin is warm and dry.  Neurological:     General: No focal deficit present.     Mental Status: He is alert. Mental status is at baseline.  Psychiatric:        Mood and Affect: Mood normal.        Thought Content: Thought content normal.  No results found for this or any previous visit (from the past 48 hour(s)).  Assessment & Plan  1. Hiccups  please take Chlorpromazine HCL 25mg  tablets every 8 hours until hiccups resolve. This medicine is only as needed. If hiccups last longer than 3 days with medications please stop medicine and come in to be evaluated at a medical facility.  2. Acute cystitis with hematuria continue the antibiotic as prescribed, please provide a urine sample for [place] as soon as possible for rechecking urine culture.   Follow up in 1 week.

## 2018-05-17 NOTE — Patient Instructions (Addendum)
FOR HICCUPS: please take Chlorpromazine HCL 25mg  tablets every 8 hours until hiccups resolve. This medicine is only as needed. If hiccups last longer than 3 days with medications please stop medicine and come in to be evaluated at a medical facility.   FOR URINARY TRACT INFECTION: continue the antibiotic as prescribed, please provide a urine sample for [place] as soon as possible for rechecking urine culture.

## 2018-05-19 NOTE — ED Provider Notes (Signed)
[facility]    CSN: [id number] Arrival date & time: 05/16/18  1753  History   Chief Complaint Chief Complaint  Patient presents with  . Hiccups   HPI  78 year old male with a complicated past medical history including cerebral palsy presents for evaluation of the above.  Patient's past medical history and current status, history is obtained primarily from the caregiver.  Caregiver states that he has recently been treated by his PCP for UTI.  This occurred on 1/22.  She states that he was treated for hiccups with chlorpromazine.  Per the caregiver, there have been caregiver and family concerns about side effects of chlorpromazine.  He has not taken the medication due to side effects.  Hiccups now resolved.  Caregiver states that he has been leaning forward in his wheelchair.  This has been concerning to some of the caregivers that he has.  The current caregiver states that he often does this when he has urinary tract infection.  When asked directly, the patient denies pain.  Caregiver states that she feels like he is at his baseline.  There have been some reports of possible facial droop.  She states that he has been eating well and seems to be okay.  No fever.  No other associated symptoms.  No other complaints.  Past Medical History:  Diagnosis Date  . Anxiety   . Atrophy, disuse, muscle   . BPH (benign prostatic hyperplasia)   . Cerebral palsy ([place])   . Eczema   . Elevated lipids   . Elevated PSA   . Exotropia   . Hematuria, gross   . Hyperlipidemia   . Incontinence   . Kidney stone   . Lower extremity edema    lymph drainage problems  . Lower urinary tract symptoms   . Lymphedema   . Mental retardation   . Over weight   . Prostate cancer ([place])   . Urinary frequency   . Urinary urgency   . UTI (urinary tract infection)   . Venous insufficiency   . Vitamin D deficiency   . Wheelchair dependence     Patient Active Problem List   Diagnosis Date Noted  .  Renal mass 07/23/2017  . Ureteral calculus 07/23/2017  . PAD (peripheral artery disease) ([place]) 07/04/2017  . Aortic valve calcification 07/04/2017  . Coronary artery calcification 07/04/2017  . Hypertension 10/26/2016  . Late effect acute polio 05/17/2016  . Chronic constipation 11/19/2015  . Mild major depression ([place]) 11/19/2015  . Complicated grieving 30/86/5784  . Recurrent UTI 09/11/2015  . Adenocarcinoma of prostate ([place]) 03/02/2015  . Amyotrophia 03/02/2015  . Edema leg 03/02/2015  . Cerebral palsy ([place]) 03/02/2015  . Dyslipidemia 03/02/2015  . Dermatitis, eczematoid 03/02/2015  . 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 H/O acute poliomyelitis 03/02/2015  . Lymphedema 03/02/2015  . Mental retardation 03/02/2015  . Hypertrophy of nail 03/02/2015  . Adiposity 03/02/2015  . Absence of bladder continence 03/02/2015  . Chronic venous insufficiency 03/02/2015  . Avitaminosis D 03/02/2015  . Adynamia 03/02/2015  . Dependent on wheelchair 03/02/2015  . Kidney cysts 03/02/2015  . At risk for falling 03/02/2015  . Cerebral vascular disease 03/02/2015  . Strabismus 02/11/2015  . Urge incontinence of urine 02/11/2015  . Wheelchair dependence 02/11/2015  . Obesity 02/11/2015  . Urinary frequency 10/30/2014    Past Surgical History:  Procedure Laterality Date  . CYSTOSCOPY/URETEROSCOPY/HOLMIUM LASER/STENT PLACEMENT Right 08/14/2017   Procedure: CYSTOSCOPY/URETEROSCOPY/HOLMIUM LASER/STENT PLACEMENT;  Surgeon: Abbie Sons, MD;  Location:  [facility] ORS;  Service: Urology;  Laterality: Right;  . leg circulation surgery Right   . PROSTATE SURGERY  11/10/2008   BRACHYTHERAPY       Home Medications    Prior to Admission medications   Medication Sig Start Date End Date Taking? Authorizing Provider  alfuzosin (UROXATRAL) 10 MG 24 hr tablet TAKE 1 TABLET (10MG ) BY MOUTH ONCE DAILY WITH BREAKFAST*DO NOT CRUSH* 01/24/18  Yes Sowles, Drue Stager, MD  ALPRAZolam (XANAX) 0.25 MG tablet TAKE ONE  TABLET BY MOUTH 2 TIMES A DAY AS NEEDED FOR ANXIETY (WHEN BLOOD PRESSURE IS VERY HIGH OR ANXIOUS) 07/05/17  Yes Sowles, Drue Stager, MD  AMITIZA 24 MCG capsule TAKE ONE CAPSULE BY MOUTH TWO TIMES A DAY WITH A MEAL 01/24/18  Yes Sowles, Drue Stager, MD  ARTIFICIAL TEAR SOLUTION OP Apply 2 drops to eye 2 (two) times daily as needed (dry eyes).   Yes [provider]  aspirin (ASPIRIN ADULT LOW STRENGTH) 81 MG EC tablet TAKE ONE TABLET BY MOUTH EVERY DAY 01/24/18  Yes Sowles, Drue Stager, MD  atorvastatin (LIPITOR) 40 MG tablet TAKE 1 TABLET BY MOUTH EACH EVENING 04/19/18  Yes Sowles, Drue Stager, MD  betamethasone dipropionate (DIPROLENE) 0.05 % cream Apply 1 application topically as needed (rash). 01/20/18  Yes Sowles, Drue Stager, MD  Calcium Carb-Cholecalciferol 500-400 MG-UNIT TABS TAKE ONE TABLET BY MOUTH 2 TIMES A DAY 03/18/18  Yes Sowles, Drue Stager, MD  chlorproMAZINE (THORAZINE) 25 MG tablet Take 1 tablet (25 mg total) by mouth 3 (three) times daily for 5 days. 05/15/18 05/20/18 Yes Hubbard Hartshorn, FNP  Cranberry Juice Powder 425 MG CAPS TAKE ONE CAPSULE BY MOUTH EVERY DAY 01/24/18  Yes Sowles, Drue Stager, MD  Polyethyl Glycol-Propyl Glycol (SYSTANE ULTRA) 0.4-0.3 % SOLN Apply 1 drop to eye 2 (two) times daily. Both eyes   Yes [provider]  sertraline (ZOLOFT) 100 MG tablet TAKE 1 TABLET BY MOUTH ONCE DAILY 04/19/18  Yes Sowles, Drue Stager, MD  sulfamethoxazole-trimethoprim (BACTRIM DS,SEPTRA DS) 800-160 MG tablet Take 1 tablet by mouth 2 (two) times daily for 7 days. 05/15/18 05/22/18 Yes Hubbard Hartshorn, FNP  vitamin C (ASCORBIC ACID) 500 MG tablet TAKE ONE TABLET BY MOUTH TWO TIMES A DAY 02/22/18  Yes Steele Sizer, MD  Elastic Bandages & Supports (MEDICAL COMPRESSION STOCKINGS) MISC 1 Units by Does not apply route daily. 07/19/15   Steele Sizer, MD  Incontinence Supply Disposable (DEPEND SILHOUETTE BRIEFS L/XL) MISC 1 each by Does not apply route 2 (two) times daily as needed. 11/30/17   Steele Sizer, MD      Family History Family History  Problem Relation Age of Onset  . Heart attack Brother   . Heart disease Other     Social History Social History   Tobacco Use  . Smoking status: Never Smoker  . Smokeless tobacco: Never Used  Substance Use Topics  . Alcohol use: No    Alcohol/week: 0.0 standard drinks  . Drug use: No     Allergies   Patient has no known allergies.   Review of Systems Review of Systems  Constitutional: Negative for appetite change and fever.  Musculoskeletal:       Leaning forward.   Neurological:       Possible facial droop.   Physical Exam Triage Vital Signs ED Triage Vitals  Enc Vitals Group     BP 05/16/18 1912 99/75     Pulse Rate 05/16/18 1912 93     Resp 05/16/18 1912 18     Temp 05/16/18  1912 98.1 F (36.7 C)     Temp Source 05/16/18 1912 Oral     SpO2 05/16/18 1912 98 %     Weight 05/16/18 1907 190 lb 0.6 oz (86.2 kg)     Height --      Head Circumference --      Peak Flow --      Pain Score --      Pain Loc --      Pain Edu? --      Excl. in [place]? --    Updated Vital Signs BP 99/75 (BP Location: Left Arm)   Pulse 93   Temp 98.1 F (36.7 C) (Oral)   Resp 18   Wt 86.2 kg   SpO2 98%   BMI 28.89 kg/m   Visual Acuity Right Eye Distance:   Left Eye Distance:   Bilateral Distance:    Right Eye Near:   Left Eye Near:    Bilateral Near:     Physical Exam Vitals signs and nursing note reviewed.  Constitutional:      General: He is not in acute distress. HENT:     Head: Normocephalic and atraumatic.     Nose: Nose normal.  Eyes:     General:        Right eye: No discharge.        Left eye: No discharge.     Conjunctiva/sclera: Conjunctivae normal.  Cardiovascular:     Rate and Rhythm: Normal rate and regular rhythm.  Pulmonary:     Effort: Pulmonary effort is normal.     Breath sounds: Normal breath sounds.  Abdominal:     General: There is no distension.     Palpations: Abdomen is soft.     Tenderness: There  is no abdominal tenderness.  Neurological:     Mental Status: He is alert.     Comments: No appreciable facial droop.     UC Treatments / Results  Labs (all labs ordered are listed, but only abnormal results are displayed) Labs Reviewed - No data to display  EKG None  Radiology No results found.  Procedures Procedures (including critical care time)  Medications Ordered in UC Medications - No data to display  Initial Impression / Assessment and Plan / UC Course  I have reviewed the triage vital signs and the nursing notes.  Pertinent labs & imaging results that were available during my care of the patient were reviewed by me and considered in my medical decision making (see chart for details).    78 year old male presents for evaluation of multiple caregiver concerns.  His hiccups have now resolved.  He is currently being treated for UTI.  He appears well.  I have advised the caregiver that I feel that he can wait until his appointment with his primary care provider tomorrow.  If he develops intractable hiccups again or any other worrisome symptoms, they should take him to the emergency room.  Final Clinical Impressions(s) / UC Diagnoses   Final diagnoses:  Hiccups     Discharge Instructions     Please keep a close eye.  Monitor for changes.  If worsens, develops fever, behavioral changes, or hiccups return take him to the hospital.  Dr. Lacinda Axon    ED Prescriptions    None     Controlled Substance Prescriptions [place] Controlled Substance Registry consulted? Not Applicable   Coral Spikes, [place] 05/19/18 [id number]

## 2018-05-22 ENCOUNTER — Ambulatory Visit (INDEPENDENT_AMBULATORY_CARE_PROVIDER_SITE_OTHER): Payer: Medicare Other | Admitting: Urology

## 2018-05-22 ENCOUNTER — Encounter: Payer: Self-pay | Admitting: Urology

## 2018-05-22 VITALS — BP 99/62 | HR 76

## 2018-05-22 DIAGNOSIS — Z87440 Personal history of urinary (tract) infections: Secondary | ICD-10-CM

## 2018-05-22 DIAGNOSIS — R310 Gross hematuria: Secondary | ICD-10-CM | POA: Diagnosis not present

## 2018-05-22 LAB — URINALYSIS, COMPLETE
Bilirubin, UA: NEGATIVE
Glucose, UA: NEGATIVE
Ketones, UA: NEGATIVE
Leukocytes, UA: NEGATIVE
Nitrite, UA: NEGATIVE
Protein, UA: NEGATIVE
RBC, UA: NEGATIVE
Specific Gravity, UA: >1.03 — ABNORMAL HIGH (ref 1.005–1.030)
Urobilinogen, Ur: 1 mg/dL (ref 0.2–1.0)
pH, UA: 5.5 (ref 5.0–7.5)

## 2018-05-22 NOTE — Progress Notes (Signed)
05/22/2018 2:47 PM   [name] December 18, 1940 [id number]  Referring provider: Steele Sizer, MD [address] [place], [place] [address]  Chief Complaint  Patient presents with  . Recurrent UTI    HPI: 78 year old male with cerebral palsy and history of stone disease.  He was recently seen for malodorous urine and lower urinary tract symptoms.  A urine culture grew multiple species.  He was treated with a seven-day course of Septra DS.  His caregiver today states he is doing better and states their biggest problem is getting him to drink fluids.  He voided prior to coming to the office and was unable to give a specimen today.  He was catheterized and only 60 cc of urine was obtained.  The urinalysis was completely clear without WBCs or RBCs.   PMH: Past Medical History:  Diagnosis Date  . Anxiety   . Atrophy, disuse, muscle   . BPH (benign prostatic hyperplasia)   . Cerebral palsy ([place])   . Eczema   . Elevated lipids   . Elevated PSA   . Exotropia   . Hematuria, gross   . Hyperlipidemia   . Incontinence   . Kidney stone   . Lower extremity edema    lymph drainage problems  . Lower urinary tract symptoms   . Lymphedema   . Mental retardation   . Over weight   . Prostate cancer ([place])   . Urinary frequency   . Urinary urgency   . UTI (urinary tract infection)   . Venous insufficiency   . Vitamin D deficiency   . Wheelchair dependence     Surgical History: Past Surgical History:  Procedure Laterality Date  . CYSTOSCOPY/URETEROSCOPY/HOLMIUM LASER/STENT PLACEMENT Right 08/14/2017   Procedure: CYSTOSCOPY/URETEROSCOPY/HOLMIUM LASER/STENT PLACEMENT;  Surgeon: Abbie Sons, MD;  Location: [facility] ORS;  Service: Urology;  Laterality: Right;  . leg circulation surgery Right   . PROSTATE SURGERY  11/10/2008   BRACHYTHERAPY    Home Medications:  Allergies as of 05/22/2018   No Known Allergies     Medication List       Accurate as of May 22, 2018  2:47 PM. Always use your most recent med list.        alfuzosin 10 MG 24 hr tablet Commonly known as:  UROXATRAL TAKE 1 TABLET (10MG ) BY MOUTH ONCE DAILY WITH BREAKFAST*DO NOT CRUSH*   ALPRAZolam 0.25 MG tablet Commonly known as:  XANAX TAKE ONE TABLET BY MOUTH 2 TIMES A DAY AS NEEDED FOR ANXIETY (WHEN BLOOD PRESSURE IS VERY HIGH OR ANXIOUS)   AMITIZA 24 MCG capsule Generic drug:  lubiprostone TAKE ONE CAPSULE BY MOUTH TWO TIMES A DAY WITH A MEAL   ARTIFICIAL TEAR SOLUTION OP Apply 2 drops to eye 2 (two) times daily as needed (dry eyes).   aspirin 81 MG EC tablet Commonly known as:  ASPIRIN ADULT LOW STRENGTH TAKE ONE TABLET BY MOUTH EVERY DAY   atorvastatin 40 MG tablet Commonly known as:  LIPITOR TAKE 1 TABLET BY MOUTH EACH EVENING   betamethasone dipropionate 0.05 % cream Commonly known as:  DIPROLENE Apply 1 application topically as needed (rash).   Calcium Carb-Cholecalciferol 500-400 MG-UNIT Tabs TAKE ONE TABLET BY MOUTH 2 TIMES A DAY   chlorproMAZINE 25 MG tablet Commonly known as:  THORAZINE Take 1 tablet (25 mg total) by mouth 3 (three) times daily for 5 days.   Cranberry Juice Powder 425 MG Caps TAKE ONE CAPSULE BY MOUTH EVERY DAY  DEPEND SILHOUETTE BRIEFS L/XL Misc 1 each by Does not apply route 2 (two) times daily as needed.   Medical Compression Stockings Misc 1 Units by Does not apply route daily.   sertraline 100 MG tablet Commonly known as:  ZOLOFT TAKE 1 TABLET BY MOUTH ONCE DAILY   SYSTANE ULTRA 0.4-0.3 % Soln Generic drug:  Polyethyl Glycol-Propyl Glycol Apply 1 drop to eye 2 (two) times daily. Both eyes   vitamin C 500 MG tablet Commonly known as:  ASCORBIC ACID TAKE ONE TABLET BY MOUTH TWO TIMES A DAY       Allergies: No Known Allergies  Family History: Family History  Problem Relation Age of Onset  . Heart attack Brother   . Heart disease Other     Social History:  reports that he has never smoked. He has never used  smokeless tobacco. He reports that he does not drink alcohol or use drugs.  ROS: UROLOGY Frequent Urination?: No Hard to postpone urination?: No Burning/pain with urination?: No Get up at night to urinate?: No Leakage of urine?: No Urine stream starts and stops?: No Trouble starting stream?: No Do you have to strain to urinate?: No Blood in urine?: No Urinary tract infection?: Yes Sexually transmitted disease?: No Injury to kidneys or bladder?: No Painful intercourse?: No Weak stream?: No Erection problems?: No Penile pain?: No  Gastrointestinal Nausea?: No Vomiting?: No Indigestion/heartburn?: No Diarrhea?: No Constipation?: No  Constitutional Fever: No Night sweats?: No Weight loss?: No Fatigue?: No  Skin Skin rash/lesions?: No Itching?: No  Eyes Blurred vision?: No Double vision?: No  Ears/Nose/Throat Sore throat?: No Sinus problems?: No  Hematologic/Lymphatic Swollen glands?: No Easy bruising?: No  Cardiovascular Leg swelling?: No Chest pain?: No  Respiratory Cough?: No Shortness of breath?: No  Endocrine Excessive thirst?: No  Musculoskeletal Back pain?: No Joint pain?: No  Neurological Headaches?: No Dizziness?: No  Psychologic Depression?: Yes Anxiety?: No  Physical Exam: BP 99/62 (BP Location: Left Arm, Patient Position: Sitting, Cuff Size: Normal)   Pulse 76   Constitutional:  Alert and oriented, No acute distress.   Assessment & Plan:   Recent UTI treatment.  Urine culture grew multiple species.  Urinalysis today is completely clear.  He has a follow-up appointment scheduled next October and they were instructed to call earlier for any recurrent symptoms.    Abbie Sons, [place] [address] [place],  [address] [phone]

## 2018-05-24 ENCOUNTER — Ambulatory Visit (INDEPENDENT_AMBULATORY_CARE_PROVIDER_SITE_OTHER): Payer: Medicare Other | Admitting: Nurse Practitioner

## 2018-05-24 ENCOUNTER — Encounter: Payer: Self-pay | Admitting: Nurse Practitioner

## 2018-05-24 VITALS — BP 108/58 | HR 85 | Temp 97.7°F | Resp 14 | Ht 67.0 in | Wt 189.0 lb

## 2018-05-24 DIAGNOSIS — D649 Anemia, unspecified: Secondary | ICD-10-CM | POA: Diagnosis not present

## 2018-05-24 DIAGNOSIS — I959 Hypotension, unspecified: Secondary | ICD-10-CM | POA: Diagnosis not present

## 2018-05-24 DIAGNOSIS — D539 Nutritional anemia, unspecified: Secondary | ICD-10-CM | POA: Diagnosis not present

## 2018-05-24 DIAGNOSIS — R066 Hiccough: Secondary | ICD-10-CM | POA: Diagnosis not present

## 2018-05-24 DIAGNOSIS — R638 Other symptoms and signs concerning food and fluid intake: Secondary | ICD-10-CM

## 2018-05-24 DIAGNOSIS — R399 Unspecified symptoms and signs involving the genitourinary system: Secondary | ICD-10-CM

## 2018-05-24 LAB — CULTURE, URINE COMPREHENSIVE

## 2018-05-24 NOTE — Patient Instructions (Signed)
Please drink plenty of fluids to keep urine pale yellow.  You should hear back within a week about lab results please call if you do not.    Dehydration Dehydration is when there is not enough fluid or water in your body. This happens when you lose more fluids than you take in. People who are age 76 or older have a higher risk of getting dehydrated. Dehydration can range from mild to very bad. It should be treated right away to keep it from getting very bad. Symptoms of mild dehydration may include:  Thirst.  Dry lips.  Slightly dry mouth.  Dry, warm skin.  Dizziness. Symptoms of moderate dehydration may include:  Very dry mouth.  Muscle cramps.  Dark pee (urine). Pee may be the color of tea.  Your body making less pee.  Your eyes making fewer tears.  Heartbeat that is uneven or faster than normal (palpitations).  Headache.  Light-headedness, especially when you stand up from sitting.  Fainting (syncope). Symptoms of very bad dehydration may include:  Changes in skin, such as: ? Cold and clammy skin. ? Blotchy (mottled) or pale skin. ? Skin that does not quickly return to normal after being lightly pinched and let go (poor skin turgor).  Changes in body fluids, such as: ? Feeling very thirsty. ? Your eyes making fewer tears. ? Not sweating when body temperature is high, such as in hot weather. ? Your body making very little pee.  Changes in vital signs, such as: ? Weak pulse. ? Pulse that is more than 100 beats a minute when you are sitting still. ? Fast breathing. ? Low blood pressure.  Other changes, such as: ? Sunken eyes. ? Cold hands and feet. ? Confusion. ? Lack of energy (lethargy). ? Trouble waking up from sleep. ? Short-term weight loss. ? Unconsciousness. Follow these instructions at home:   If told by your doctor, drink an ORS: ? Make an ORS by using instructions on the package. ? Start by drinking small amounts, about  cup (120 mL)  every 5-10 minutes. ? Slowly drink more until you have had the amount that your doctor said to have.  Drink enough clear fluid to keep your pee clear or pale yellow. If you were told to drink an ORS, finish the ORS first, then start slowly drinking clear fluids. Drink fluids such as: ? Water. Do not drink only water by itself. Doing that can make the salt (sodium) level in your body get too low (hyponatremia). ? Ice chips. ? Fruit juice that you have added water to (diluted). ? Low-calorie sports drinks.  Avoid: ? Alcohol. ? Drinks that have a lot of sugar. These include high-calorie sports drinks, fruit juice that does not have water added, and soda. ? Caffeine. ? Foods that are greasy or have a lot of fat or sugar.  Take over-the-counter and prescription medicines only as told by your doctor.  Do not take salt tablets. Doing that can make the salt level in your body get too high (hypernatremia).  Eat foods that have minerals (electrolytes). Examples include bananas, oranges, potatoes, tomatoes, and spinach.  Keep all follow-up visits as told by your doctor. This is important. Contact a doctor if:  You have belly (abdominal) pain that: ? Gets worse. ? Stays in one area (localizes).  You have a rash.  You have a stiff neck.  You get angry or annoyed more easily than normal (irritability).  You are more sleepy than normal.  You  have a harder time waking up than normal.  You feel: ? Weak. ? Dizzy. ? Very thirsty. Get help right away if:  You have symptoms of very bad dehydration.  You cannot drink fluids without throwing up (vomiting).  Your symptoms get worse with treatment.  You have a fever.  You have a very bad headache.  You are throwing up or having watery poop (diarrhea) and it: ? Gets worse. ? Does not go away.  You have diarrhea for more than 24 hours.  You have blood or something green (bile) in your throw-up.  You have blood in your poop (stool).  This may cause poop to look black and tarry.  You have not peed in 6-8 hours.  You have peed (urinated) only a small amount of very dark pee during 6-8 hours.  You pass out (faint).  Your heart rate when you are sitting still is more than 100 beats a minute.  You have trouble breathing. This information is not intended to replace advice given to you by your health care provider. Make sure you discuss any questions you have with your health care provider. Document Released: 03/30/2011 Document Revised: 10/29/2015 Document Reviewed: 06/04/2015 Elsevier Interactive Patient Education  2019 [employer].

## 2018-05-24 NOTE — Progress Notes (Signed)
Name: [name]   MRN: [id number]    DOB: [birth year]   Date:05/24/2018       Progress Note  Subjective  Chief Complaint  Chief Complaint  Patient presents with  . Follow-up    patient's caregiver stated that the urologist [facility]) said his urine has cleared, but they noticed that he is still not drinking much.     HPI  Patient presents with caregiver, for one-week follow-up was having intermittent hiccups and UTI symptoms.  Patient was unable to provide urine sample at last visit and was going to provide 1 over the weekend and return today for follow-up.  Patient had gone to urology between this appointment and last one urine culture was negative.  Caregiver notes patient has not been drinking as much as he has previously.  States his urine is still light yellow color.  He drinks a lot of unsweet tea and a try to dilute it with some more water.  No change in behavior.  She also does note that he leans towards the right-this is chronic and not new issue.  Patient is also able to lean towards the left without any issues.  He sees physical therapy at least once a week and usually also during his daytime program.  No weakness, changes in speech, pains, vision changes noted.  Patient Active Problem List   Diagnosis Date Noted  . Renal mass 07/23/2017  . Ureteral calculus 07/23/2017  . PAD (peripheral artery disease) ([place]) 07/04/2017  . Aortic valve calcification 07/04/2017  . Coronary artery calcification 07/04/2017  . Hypertension 10/26/2016  . Late effect acute polio 05/17/2016  . Chronic constipation 11/19/2015  . Mild major depression ([place]) 11/19/2015  . Complicated grieving 46/96/2952  . Recurrent UTI 09/11/2015  . Adenocarcinoma of prostate ([place]) 03/02/2015  . Amyotrophia 03/02/2015  . Edema leg 03/02/2015  . Cerebral palsy ([place]) 03/02/2015  . Dyslipidemia 03/02/2015  . Dermatitis, eczematoid 03/02/2015  . 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 H/O acute poliomyelitis 03/02/2015   . Lymphedema 03/02/2015  . Mental retardation 03/02/2015  . Hypertrophy of nail 03/02/2015  . Adiposity 03/02/2015  . Absence of bladder continence 03/02/2015  . Chronic venous insufficiency 03/02/2015  . Avitaminosis D 03/02/2015  . Adynamia 03/02/2015  . Dependent on wheelchair 03/02/2015  . Kidney cysts 03/02/2015  . At risk for falling 03/02/2015  . Cerebral vascular disease 03/02/2015  . Strabismus 02/11/2015  . Urge incontinence of urine 02/11/2015  . Wheelchair dependence 02/11/2015  . Obesity 02/11/2015  . Urinary frequency 10/30/2014    Past Medical History:  Diagnosis Date  . Anxiety   . Atrophy, disuse, muscle   . BPH (benign prostatic hyperplasia)   . Cerebral palsy ([place])   . Eczema   . Elevated lipids   . Elevated PSA   . Exotropia   . Hematuria, gross   . Hyperlipidemia   . Incontinence   . Kidney stone   . Lower extremity edema    lymph drainage problems  . Lower urinary tract symptoms   . Lymphedema   . Mental retardation   . Over weight   . Prostate cancer ([place])   . Urinary frequency   . Urinary urgency   . UTI (urinary tract infection)   . Venous insufficiency   . Vitamin D deficiency   . Wheelchair dependence     Past Surgical History:  Procedure Laterality Date  . CYSTOSCOPY/URETEROSCOPY/HOLMIUM LASER/STENT PLACEMENT Right 08/14/2017   Procedure: CYSTOSCOPY/URETEROSCOPY/HOLMIUM LASER/STENT PLACEMENT;  Surgeon: John Giovanni  C, MD;  Location: [facility] ORS;  Service: Urology;  Laterality: Right;  . leg circulation surgery Right   . PROSTATE SURGERY  11/10/2008   BRACHYTHERAPY    Social History   Tobacco Use  . Smoking status: Never Smoker  . Smokeless tobacco: Never Used  Substance Use Topics  . Alcohol use: No    Alcohol/week: 0.0 standard drinks     Current Outpatient Medications:  .  alfuzosin (UROXATRAL) 10 MG 24 hr tablet, TAKE 1 TABLET (10MG ) BY MOUTH ONCE DAILY WITH BREAKFAST*DO NOT CRUSH*, Disp: 28 tablet, Rfl: 5 .   ALPRAZolam (XANAX) 0.25 MG tablet, TAKE ONE TABLET BY MOUTH 2 TIMES A DAY AS NEEDED FOR ANXIETY (WHEN BLOOD PRESSURE IS VERY HIGH OR ANXIOUS), Disp: 20 tablet, Rfl: 0 .  AMITIZA 24 MCG capsule, TAKE ONE CAPSULE BY MOUTH TWO TIMES A DAY WITH A MEAL, Disp: 56 capsule, Rfl: 5 .  ARTIFICIAL TEAR SOLUTION OP, Apply 2 drops to eye 2 (two) times daily as needed (dry eyes)., Disp: , Rfl:  .  aspirin (ASPIRIN ADULT LOW STRENGTH) 81 MG EC tablet, TAKE ONE TABLET BY MOUTH EVERY DAY, Disp: 28 tablet, Rfl: 5 .  atorvastatin (LIPITOR) 40 MG tablet, TAKE 1 TABLET BY MOUTH EACH EVENING, Disp: 28 tablet, Rfl: 10 .  betamethasone dipropionate (DIPROLENE) 0.05 % cream, Apply 1 application topically as needed (rash)., Disp: 45 g, Rfl: 0 .  Calcium Carb-Cholecalciferol 500-400 MG-UNIT TABS, TAKE ONE TABLET BY MOUTH 2 TIMES A DAY, Disp: 60 tablet, Rfl: 12 .  chlorproMAZINE (THORAZINE) 25 MG tablet, Take 1 tablet (25 mg total) by mouth 3 (three) times daily for 5 days., Disp: 15 tablet, Rfl: 0 .  Cranberry Juice Powder 425 MG CAPS, TAKE ONE CAPSULE BY MOUTH EVERY DAY, Disp: 28 capsule, Rfl: 5 .  Elastic Bandages & Supports (MEDICAL COMPRESSION STOCKINGS) MISC, 1 Units by Does not apply route daily., Disp: 2 each, Rfl: 0 .  Incontinence Supply Disposable (DEPEND SILHOUETTE BRIEFS L/XL) MISC, 1 each by Does not apply route 2 (two) times daily as needed., Disp: 60 each, Rfl: 5 .  Polyethyl Glycol-Propyl Glycol (SYSTANE ULTRA) 0.4-0.3 % SOLN, Apply 1 drop to eye 2 (two) times daily. Both eyes, Disp: , Rfl:  .  sertraline (ZOLOFT) 100 MG tablet, TAKE 1 TABLET BY MOUTH ONCE DAILY, Disp: 31 tablet, Rfl: 10 .  vitamin C (ASCORBIC ACID) 500 MG tablet, TAKE ONE TABLET BY MOUTH TWO TIMES A DAY, Disp: 56 tablet, Rfl: 10  No Known Allergies  ROS   No other specific complaints in a complete review of systems (except as listed in HPI above).  Objective  Vitals:   05/24/18 0915 05/24/18 0925  BP: 100/60 104/62  Pulse: 85    Resp: 14   Temp: 97.7 F (36.5 C)   TempSrc: Oral   SpO2: 94%   Weight: 189 lb (85.7 kg)   Height: 5\' 7"  (1.702 m)     Body mass index is 29.6 kg/m.  Nursing Note and Vital Signs reviewed.  Physical Exam Vitals signs reviewed.  Constitutional:      General: He is not in acute distress.    Appearance: He is well-developed. He is not ill-appearing.  HENT:     Head: Normocephalic and atraumatic.     Mouth/Throat:     Mouth: Mucous membranes are dry.  Eyes:     Conjunctiva/sclera: Conjunctivae normal.     Pupils: Pupils are equal, round, and reactive to light.  Neck:  Musculoskeletal: Normal range of motion and neck supple.     Vascular: No carotid bruit.  Cardiovascular:     Pulses:          Radial pulses are 3+ on the right side and 3+ on the left side.     Heart sounds: Normal heart sounds.  Pulmonary:     Effort: Pulmonary effort is normal.     Breath sounds: Normal breath sounds.  Musculoskeletal:     Comments: Equal and 4/5 upper and lower extremity strength with push and pull  Skin:    General: Skin is warm and dry.     Capillary Refill: Capillary refill takes less than 2 seconds.  Neurological:     Mental Status: He is alert and oriented to person, place, and time.     GCS: GCS eye subscore is 4. GCS verbal subscore is 5. GCS motor subscore is 6.     Sensory: No sensory deficit.  Psychiatric:        Speech: Speech normal.        Behavior: Behavior normal.        Thought Content: Thought content normal.        Judgment: Judgment normal.        Results for orders placed or performed in visit on 05/22/18 (from the past 48 hour(s))  Urinalysis, Complete     Status: Abnormal   Collection Time: 05/22/18  1:40 PM  Result Value Ref Range   Specific Gravity, UA >1.030 (H) 1.005 - 1.030   pH, UA 5.5 5.0 - 7.5   Color, UA Yellow Yellow   Appearance Ur Clear Clear   Leukocytes, UA Negative Negative   Protein, UA Negative Negative/Trace   Glucose, UA  Negative Negative   Ketones, UA Negative Negative   RBC, UA Negative Negative   Bilirubin, UA Negative Negative   Urobilinogen, Ur 1.0 0.2 - 1.0 mg/dL   Nitrite, UA Negative Negative  CULTURE, URINE COMPREHENSIVE     Status: None (Preliminary result)   Collection Time: 05/22/18  2:51 PM  Result Value Ref Range   Urine Culture, Comprehensive Preliminary report    Organism ID, Bacteria Comment     Comment: No growth after 18-24 hours.    Assessment & Plan  1. Hiccups Resolved   2. UTI symptoms Resolved, negative urine culture.   3. Decreased oral intake Decreased fluid intake- likely cause of mild asymptomatic hypotension. States is eating just fine.   4. Hypotension, unspecified hypotension type Encouraged increase fluids, careful with position changes and transfers, if patient has lightheadedness, dizziness please go to urgent care or ER.  - COMPLETE METABOLIC PANEL WITH GFR - CBC w/Diff/Platelet

## 2018-05-29 ENCOUNTER — Other Ambulatory Visit: Payer: Self-pay | Admitting: Nurse Practitioner

## 2018-05-29 ENCOUNTER — Other Ambulatory Visit: Payer: Self-pay

## 2018-05-29 DIAGNOSIS — D649 Anemia, unspecified: Secondary | ICD-10-CM

## 2018-05-29 DIAGNOSIS — D539 Nutritional anemia, unspecified: Secondary | ICD-10-CM

## 2018-05-30 LAB — COMPLETE METABOLIC PANEL WITH GFR
AG Ratio: 1.3 (calc) (ref 1.0–2.5)
ALT: 19 U/L (ref 9–46)
AST: 21 U/L (ref 10–35)
Albumin: 3.6 g/dL (ref 3.6–5.1)
Alkaline phosphatase (APISO): 119 U/L — ABNORMAL HIGH (ref 40–115)
BUN: 17 mg/dL (ref 7–25)
CO2: 26 mmol/L (ref 20–32)
CREATININE: 0.77 mg/dL (ref 0.70–1.18)
Calcium: 9.2 mg/dL (ref 8.6–10.3)
Chloride: 104 mmol/L (ref 98–110)
GFR, Est African American: 103 mL/min/{1.73_m2} (ref >=60)
GFR, Est Non African American: 89 mL/min/{1.73_m2} (ref >=60)
Globulin: 2.8 g/dL (calc) (ref 1.9–3.7)
Glucose, Bld: 83 mg/dL (ref 65–99)
Potassium: 4.4 mmol/L (ref 3.5–5.3)
SODIUM: 137 mmol/L (ref 135–146)
Total Bilirubin: 0.4 mg/dL (ref 0.2–1.2)
Total Protein: 6.4 g/dL (ref 6.1–8.1)

## 2018-05-30 LAB — TEST AUTHORIZATION

## 2018-05-30 LAB — CBC WITH DIFFERENTIAL/PLATELET
Absolute Monocytes: 300 cells/uL (ref 200–950)
BASOS PCT: 0.5 %
Basophils Absolute: 19 cells/uL (ref 0–200)
Eosinophils Absolute: 52 cells/uL (ref 15–500)
Eosinophils Relative: 1.4 %
HCT: 36 % — ABNORMAL LOW (ref 38.5–50.0)
Hemoglobin: 12.8 g/dL — ABNORMAL LOW (ref 13.2–17.1)
Lymphs Abs: 858 cells/uL (ref 850–3900)
MCH: 33.6 pg — ABNORMAL HIGH (ref 27.0–33.0)
MCHC: 35.6 g/dL (ref 32.0–36.0)
MCV: 94.5 fL (ref 80.0–100.0)
MPV: 10 fL (ref 7.5–12.5)
Monocytes Relative: 8.1 %
Neutro Abs: 2472 cells/uL (ref 1500–7800)
Neutrophils Relative %: 66.8 %
PLATELETS: 237 10*3/uL (ref 140–400)
RBC: 3.81 10*6/uL — ABNORMAL LOW (ref 4.20–5.80)
RDW: 12.3 % (ref 11.0–15.0)
Total Lymphocyte: 23.2 %
WBC: 3.7 10*3/uL — ABNORMAL LOW (ref 3.8–10.8)

## 2018-05-30 LAB — B12 AND FOLATE PANEL
Folate: 10.5 ng/mL
Vitamin B-12: 308 pg/mL (ref 200–1100)

## 2018-06-04 ENCOUNTER — Other Ambulatory Visit: Payer: Self-pay | Admitting: Nurse Practitioner

## 2018-06-04 ENCOUNTER — Telehealth: Payer: Self-pay | Admitting: Nurse Practitioner

## 2018-06-04 DIAGNOSIS — D72810 Lymphocytopenia: Secondary | ICD-10-CM

## 2018-06-04 DIAGNOSIS — D649 Anemia, unspecified: Secondary | ICD-10-CM

## 2018-06-04 NOTE — Telephone Encounter (Signed)
Please call patient have CBC re-drawn this week to see if hemoglobin and white blood cell count have come back up.

## 2018-06-04 NOTE — Telephone Encounter (Signed)
I spoke to one of the caretakers and informed them that he had to return for labs.  Lab orders was printed and placed up front.  Dinelda Williams-Grimes Student

## 2018-06-04 NOTE — Addendum Note (Signed)
Addended by: Saunders Glance A on: 06/04/2018 10:13 AM   Modules accepted: Orders

## 2018-06-05 DIAGNOSIS — D72810 Lymphocytopenia: Secondary | ICD-10-CM | POA: Diagnosis not present

## 2018-06-05 DIAGNOSIS — D649 Anemia, unspecified: Secondary | ICD-10-CM | POA: Diagnosis not present

## 2018-06-06 LAB — CBC WITH DIFFERENTIAL/PLATELET
Absolute Monocytes: 298 cells/uL (ref 200–950)
Basophils Absolute: 21 cells/uL (ref 0–200)
Basophils Relative: 0.6 %
Eosinophils Absolute: 49 cells/uL (ref 15–500)
Eosinophils Relative: 1.4 %
HCT: 35.9 % — ABNORMAL LOW (ref 38.5–50.0)
Hemoglobin: 12.5 g/dL — ABNORMAL LOW (ref 13.2–17.1)
LYMPHS ABS: 973 {cells}/uL (ref 850–3900)
MCH: 33.3 pg — ABNORMAL HIGH (ref 27.0–33.0)
MCHC: 34.8 g/dL (ref 32.0–36.0)
MCV: 95.7 fL (ref 80.0–100.0)
MPV: 10.4 fL (ref 7.5–12.5)
Monocytes Relative: 8.5 %
NEUTROS ABS: 2160 {cells}/uL (ref 1500–7800)
Neutrophils Relative %: 61.7 %
PLATELETS: 151 10*3/uL (ref 140–400)
RBC: 3.75 10*6/uL — ABNORMAL LOW (ref 4.20–5.80)
RDW: 12.5 % (ref 11.0–15.0)
Total Lymphocyte: 27.8 %
WBC: 3.5 10*3/uL — ABNORMAL LOW (ref 3.8–10.8)

## 2018-06-17 ENCOUNTER — Other Ambulatory Visit: Payer: Self-pay | Admitting: Nurse Practitioner

## 2018-06-17 DIAGNOSIS — D649 Anemia, unspecified: Secondary | ICD-10-CM

## 2018-06-17 DIAGNOSIS — D709 Neutropenia, unspecified: Secondary | ICD-10-CM

## 2018-06-21 ENCOUNTER — Inpatient Hospital Stay: Payer: Medicare Other

## 2018-06-21 ENCOUNTER — Inpatient Hospital Stay: Payer: Medicare Other | Attending: Oncology | Admitting: Oncology

## 2018-06-21 ENCOUNTER — Encounter: Payer: Self-pay | Admitting: Oncology

## 2018-06-21 ENCOUNTER — Other Ambulatory Visit: Payer: Self-pay

## 2018-06-21 VITALS — BP 106/62 | HR 81 | Temp 97.8°F | Ht 67.0 in | Wt 189.0 lb

## 2018-06-21 DIAGNOSIS — D649 Anemia, unspecified: Secondary | ICD-10-CM

## 2018-06-21 DIAGNOSIS — D72819 Decreased white blood cell count, unspecified: Secondary | ICD-10-CM | POA: Insufficient documentation

## 2018-06-21 LAB — TECHNOLOGIST SMEAR REVIEW: Plt Morphology: ADEQUATE

## 2018-06-21 LAB — CBC WITH DIFFERENTIAL/PLATELET
Abs Immature Granulocytes: 0.01 10*3/uL (ref 0.00–0.07)
Basophils Absolute: 0 10*3/uL (ref 0.0–0.1)
Basophils Relative: 0 %
EOS ABS: 0.1 10*3/uL (ref 0.0–0.5)
Eosinophils Relative: 1 %
HEMATOCRIT: 37.1 % — AB (ref 39.0–52.0)
Hemoglobin: 12.8 g/dL — ABNORMAL LOW (ref 13.0–17.0)
IMMATURE GRANULOCYTES: 0 %
LYMPHS ABS: 1 10*3/uL (ref 0.7–4.0)
Lymphocytes Relative: 19 %
MCH: 33.1 pg (ref 26.0–34.0)
MCHC: 34.5 g/dL (ref 30.0–36.0)
MCV: 95.9 fL (ref 80.0–100.0)
Monocytes Absolute: 0.4 10*3/uL (ref 0.1–1.0)
Monocytes Relative: 7 %
Neutro Abs: 4 10*3/uL (ref 1.7–7.7)
Neutrophils Relative %: 73 %
Platelets: 158 10*3/uL (ref 150–400)
RBC: 3.87 MIL/uL — ABNORMAL LOW (ref 4.22–5.81)
RDW: 13.4 % (ref 11.5–15.5)
WBC: 5.5 10*3/uL (ref 4.0–10.5)
nRBC: 0 % (ref 0.0–0.2)

## 2018-06-21 LAB — FERRITIN: Ferritin: 178 ng/mL (ref 24–336)

## 2018-06-21 LAB — IRON AND TIBC
Iron: 64 ug/dL (ref 45–182)
Saturation Ratios: 24 % (ref 17.9–39.5)
TIBC: 262 ug/dL (ref 250–450)
UIBC: 198 ug/dL

## 2018-06-21 LAB — RETICULOCYTES
Immature Retic Fract: 6.4 % (ref 2.3–15.9)
RBC.: 3.87 MIL/uL — ABNORMAL LOW (ref 4.22–5.81)
RETIC COUNT ABSOLUTE: 50.7 10*3/uL (ref 19.0–186.0)
Retic Ct Pct: 1.3 % (ref 0.4–3.1)

## 2018-06-21 LAB — TSH: TSH: 2.207 u[IU]/mL (ref 0.350–4.500)

## 2018-06-21 NOTE — Progress Notes (Signed)
Patient is here today to establish care for his Neutropenia.

## 2018-06-22 LAB — HEPATITIS B SURFACE ANTIBODY, QUANTITATIVE: Hep B S AB Quant (Post): <3.1 m[IU]/mL — ABNORMAL LOW (ref >9.9)

## 2018-06-22 LAB — HEPATITIS C ANTIBODY: HCV Ab: 0.1 s/co ratio (ref 0.0–0.9)

## 2018-06-22 LAB — HAPTOGLOBIN: Haptoglobin: 131 mg/dL (ref 34–355)

## 2018-06-22 LAB — HEPATITIS B CORE ANTIBODY, TOTAL: Hep B Core Total Ab: NEGATIVE

## 2018-06-22 LAB — HEPATITIS B SURFACE ANTIGEN: HEP B S AG: NEGATIVE

## 2018-06-24 LAB — MULTIPLE MYELOMA PANEL, SERUM
Albumin SerPl Elph-Mcnc: 3.3 g/dL (ref 2.9–4.4)
Albumin/Glob SerPl: 1.2 (ref 0.7–1.7)
Alpha 1: 0.2 g/dL (ref 0.0–0.4)
Alpha2 Glob SerPl Elph-Mcnc: 0.6 g/dL (ref 0.4–1.0)
B-Globulin SerPl Elph-Mcnc: 0.9 g/dL (ref 0.7–1.3)
GLOBULIN, TOTAL: 2.8 g/dL (ref 2.2–3.9)
Gamma Glob SerPl Elph-Mcnc: 1.1 g/dL (ref 0.4–1.8)
IgA: 252 mg/dL (ref 61–437)
IgG (Immunoglobin G), Serum: 1098 mg/dL (ref 700–1600)
IgM (Immunoglobulin M), Srm: 75 mg/dL (ref 15–143)
TOTAL PROTEIN ELP: 6.1 g/dL (ref 6.0–8.5)

## 2018-06-25 ENCOUNTER — Encounter: Payer: Self-pay | Admitting: Oncology

## 2018-06-25 NOTE — Progress Notes (Signed)
Hematology/Oncology Consult note [facility] Telephone:([phone] [phone]  Patient Care Team: Steele Sizer, MD as PCP - General (Family Medicine) Steele Sizer, MD (Family Medicine) Alvy Bimler, MD as Consulting Physician (Psychiatry) Abbie Sons, MD (Urology) Lorelee Cover., MD (Ophthalmology) Delana Meyer, Dolores Lory, MD (Vascular Surgery)   Name of the patient: [name]  [id number]  07-25-1940    Reason for referral- neutropenia   Referring physician- Dr. Ancil Boozer  Date of visit: 06/25/18   History of presenting illness- Patient is a 78 year old male was been referred to [place] for leukopenia.  Most recent CBC on 06/05/2018 showed a white count of 3.5, H&H of 12.5/35.9 and a platelet count of 131.  Differential on the CBC was essentially normal.  His past medical history significant for hypertension, cerebral palsy, history of  depression.  He lives in a nursing home and is here with his caregiver today who reports that he has not had any acute health issues at the nursing home recently.  There have been no recent changes to his medications and he has not been taking any over-the-counter medications or herbal medications.  He also sees urology for recurrent UTIs.  ECOG PS- 3 Pain scale- 0   Review of systems- Review of Systems  Constitutional: Positive for malaise/fatigue. Negative for chills, fever and weight loss.  HENT: Negative for congestion, ear discharge and nosebleeds.   Eyes: Negative for blurred vision.  Respiratory: Negative for cough, hemoptysis, sputum production, shortness of breath and wheezing.   Cardiovascular: Negative for chest pain, palpitations, orthopnea and claudication.  Gastrointestinal: Negative for abdominal pain, blood in stool, constipation, diarrhea, heartburn, melena, nausea and vomiting.  Genitourinary: Negative for dysuria, flank pain, frequency, hematuria and urgency.  Musculoskeletal: Negative  for back pain, joint pain and myalgias.  Skin: Negative for rash.  Neurological: Negative for dizziness, tingling, focal weakness, seizures, weakness and headaches.  Endo/Heme/Allergies: Does not bruise/bleed easily.  Psychiatric/Behavioral: Negative for depression and suicidal ideas. The patient does not have insomnia.     No Known Allergies  Patient Active Problem List   Diagnosis Date Noted  . Renal mass 07/23/2017  . Ureteral calculus 07/23/2017  . PAD (peripheral artery disease) ([place]) 07/04/2017  . Aortic valve calcification 07/04/2017  . Coronary artery calcification 07/04/2017  . Hypertension 10/26/2016  . Late effect acute polio 05/17/2016  . Chronic constipation 11/19/2015  . Mild major depression ([place]) 11/19/2015  . Complicated grieving 37/62/8315  . Recurrent UTI 09/11/2015  . Adenocarcinoma of prostate ([place]) 03/02/2015  . Amyotrophia 03/02/2015  . Edema leg 03/02/2015  . Cerebral palsy ([place]) 03/02/2015  . Dyslipidemia 03/02/2015  . Dermatitis, eczematoid 03/02/2015  . 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 H/O acute poliomyelitis 03/02/2015  . Lymphedema 03/02/2015  . Mental retardation 03/02/2015  . Hypertrophy of nail 03/02/2015  . Adiposity 03/02/2015  . Absence of bladder continence 03/02/2015  . Chronic venous insufficiency 03/02/2015  . Avitaminosis D 03/02/2015  . Adynamia 03/02/2015  . Dependent on wheelchair 03/02/2015  . Kidney cysts 03/02/2015  . At risk for falling 03/02/2015  . Cerebral vascular disease 03/02/2015  . Strabismus 02/11/2015  . Urge incontinence of urine 02/11/2015  . Wheelchair dependence 02/11/2015  . Obesity 02/11/2015  . Urinary frequency 10/30/2014     Past Medical History:  Diagnosis Date  . Anxiety   . Atrophy, disuse, muscle   . BPH (benign prostatic hyperplasia)   . Cerebral palsy ([place])   . Eczema   . Elevated lipids   .  Elevated PSA   . Exotropia   . Hematuria, gross   . Hyperlipidemia   . Incontinence   . Kidney  stone   . Lower extremity edema    lymph drainage problems  . Lower urinary tract symptoms   . Lymphedema   . Mental retardation   . Over weight   . Prostate cancer ([place])   . Urinary frequency   . Urinary urgency   . UTI (urinary tract infection)   . Venous insufficiency   . Vitamin D deficiency   . Wheelchair dependence      Past Surgical History:  Procedure Laterality Date  . CYSTOSCOPY/URETEROSCOPY/HOLMIUM LASER/STENT PLACEMENT Right 08/14/2017   Procedure: CYSTOSCOPY/URETEROSCOPY/HOLMIUM LASER/STENT PLACEMENT;  Surgeon: Abbie Sons, MD;  Location: [facility] ORS;  Service: Urology;  Laterality: Right;  . leg circulation surgery Right   . PROSTATE SURGERY  11/10/2008   BRACHYTHERAPY    Social History   Socioeconomic History  . Marital status: Single    Spouse name: Not on file  . Number of children: Not on file  . Years of education: Not on file  . Highest education level: Not on file  Occupational History  . Not on file  Social Needs  . Financial resource strain: Not hard at all  . Food insecurity:    Worry: Never true    Inability: Never true  . Transportation needs:    Medical: No    Non-medical: No  Tobacco Use  . Smoking status: Never Smoker  . Smokeless tobacco: Never Used  Substance and Sexual Activity  . Alcohol use: No    Alcohol/week: 0.0 standard drinks  . Drug use: No  . Sexual activity: Not Currently  Lifestyle  . Physical activity:    Days per week: 0 days    Minutes per session: 0 min  . Stress: Not at all  Relationships  . Social connections:    Talks on phone: Never    Gets together: Never    Attends religious service: Never    Active member of club or organization: No    Attends meetings of clubs or organizations: Never    Relationship status: Never married  . Intimate partner violence:    Fear of current or ex partner: Patient refused    Emotionally abused: Patient refused    Physically abused: Patient refused    Forced sexual  activity: Patient refused  Other Topics Concern  . Not on file  Social History Narrative   Lives at [facility] group home, 5 males.          Family History  Problem Relation Age of Onset  . Heart attack Brother   . Heart disease Other      Current Outpatient Medications:  .  alfuzosin (UROXATRAL) 10 MG 24 hr tablet, TAKE 1 TABLET (10MG ) BY MOUTH ONCE DAILY WITH BREAKFAST*DO NOT CRUSH*, Disp: 28 tablet, Rfl: 5 .  ALPRAZolam (XANAX) 0.25 MG tablet, TAKE ONE TABLET BY MOUTH 2 TIMES A DAY AS NEEDED FOR ANXIETY (WHEN BLOOD PRESSURE IS VERY HIGH OR ANXIOUS), Disp: 20 tablet, Rfl: 0 .  AMITIZA 24 MCG capsule, TAKE ONE CAPSULE BY MOUTH TWO TIMES A DAY WITH A MEAL, Disp: 56 capsule, Rfl: 5 .  ARTIFICIAL TEAR SOLUTION OP, Apply 2 drops to eye 2 (two) times daily as needed (dry eyes)., Disp: , Rfl:  .  aspirin (ASPIRIN ADULT LOW STRENGTH) 81 MG EC tablet, TAKE ONE TABLET BY MOUTH EVERY DAY, Disp: 28 tablet, Rfl: 5 .  atorvastatin (LIPITOR) 40 MG tablet, TAKE 1 TABLET BY MOUTH EACH EVENING, Disp: 28 tablet, Rfl: 10 .  betamethasone dipropionate (DIPROLENE) 0.05 % cream, Apply 1 application topically as needed (rash)., Disp: 45 g, Rfl: 0 .  Calcium Carb-Cholecalciferol 500-400 MG-UNIT TABS, TAKE ONE TABLET BY MOUTH 2 TIMES A DAY, Disp: 60 tablet, Rfl: 12 .  chlorproMAZINE (THORAZINE) 25 MG tablet, Take 1 tablet (25 mg total) by mouth 3 (three) times daily for 5 days., Disp: 15 tablet, Rfl: 0 .  Cranberry Juice Powder 425 MG CAPS, TAKE ONE CAPSULE BY MOUTH EVERY DAY, Disp: 28 capsule, Rfl: 5 .  Elastic Bandages & Supports (MEDICAL COMPRESSION STOCKINGS) MISC, 1 Units by Does not apply route daily., Disp: 2 each, Rfl: 0 .  Incontinence Supply Disposable (DEPEND SILHOUETTE BRIEFS L/XL) MISC, 1 each by Does not apply route 2 (two) times daily as needed., Disp: 60 each, Rfl: 5 .  Polyethyl Glycol-Propyl Glycol (SYSTANE ULTRA) 0.4-0.3 % SOLN, Apply 1 drop to eye 2 (two) times daily. Both eyes, Disp: ,  Rfl:  .  sertraline (ZOLOFT) 100 MG tablet, TAKE 1 TABLET BY MOUTH ONCE DAILY, Disp: 31 tablet, Rfl: 10 .  vitamin C (ASCORBIC ACID) 500 MG tablet, TAKE ONE TABLET BY MOUTH TWO TIMES A DAY, Disp: 56 tablet, Rfl: 10   Physical exam:  Vitals:   06/21/18 0945  BP: 106/62  Pulse: 81  Temp: 97.8 F (36.6 C)  TempSrc: Tympanic  Weight: 189 lb (85.7 kg)  Height: 5\' 7"  (1.702 m)   Physical Exam Constitutional:      Comments: Frail elderly gentleman sitting in a wheelchair.  Appears in no acute distress  HENT:     Head: Normocephalic and atraumatic.  Eyes:     Pupils: Pupils are equal, round, and reactive to light.  Neck:     Musculoskeletal: Normal range of motion.  Cardiovascular:     Rate and Rhythm: Normal rate and regular rhythm.     Heart sounds: Normal heart sounds.  Pulmonary:     Effort: Pulmonary effort is normal.     Breath sounds: Normal breath sounds.  Abdominal:     General: Bowel sounds are normal.     Palpations: Abdomen is soft.     Tenderness: There is no abdominal tenderness.     Comments: Patient is unable to sit up on examination table and therefore abdominal exam is limited  Skin:    General: Skin is warm and dry.  Neurological:     Mental Status: He is alert and oriented to person, place, and time.        CMP Latest Ref Rng & Units 05/24/2018  Glucose 65 - 99 mg/dL 83  BUN 7 - 25 mg/dL 17  Creatinine 0.70 - 1.18 mg/dL 0.77  Sodium 135 - 146 mmol/L 137  Potassium 3.5 - 5.3 mmol/L 4.4  Chloride 98 - 110 mmol/L 104  CO2 20 - 32 mmol/L 26  Calcium 8.6 - 10.3 mg/dL 9.2  Total Protein 6.1 - 8.1 g/dL 6.4  Total Bilirubin 0.2 - 1.2 mg/dL 0.4  Alkaline Phos 40 - 115 U/L -  AST 10 - 35 U/L 21  ALT 9 - 46 U/L 19   CBC Latest Ref Rng & Units 06/21/2018  WBC 4.0 - 10.5 K/uL 5.5  Hemoglobin 13.0 - 17.0 g/dL 12.8(L)  Hematocrit 39.0 - 52.0 % 37.1(L)  Platelets 150 - 400 K/uL 158     Assessment and plan- Patient is a 78 y.o. male  referred for  leukopenia  Leukopenia: His differential is normal and he has had a normal white count up until August 2019.  Today I will check a CBC with differential as well as a smear review and hepatitis B and hepatitis C testing.  I noticed that previously his hemoglobin was normal close to 13-14 and since January it is drifted down to 12.8.  I will therefore be doing an anemia work-up including reticulocyte count, TSH, ferritin and iron studies, myeloma panel haptoglobin.  I will see the patient back in 2 weeks time to discuss the results of blood work with his caregiver and further management   Thank you for this kind referral and the opportunity to participate in the care of this patient   Visit Diagnosis 1. Leukopenia, unspecified type   2. Normocytic anemia     Dr. Randa Evens, MD, MPH [facility] at [facility] [phone] 06/25/2018

## 2018-06-26 ENCOUNTER — Encounter: Payer: Self-pay | Admitting: Family Medicine

## 2018-06-26 ENCOUNTER — Ambulatory Visit (INDEPENDENT_AMBULATORY_CARE_PROVIDER_SITE_OTHER): Payer: Medicare Other | Admitting: Family Medicine

## 2018-06-26 VITALS — BP 130/71 | HR 64 | Ht 67.0 in

## 2018-06-26 DIAGNOSIS — Z87440 Personal history of urinary (tract) infections: Secondary | ICD-10-CM | POA: Diagnosis not present

## 2018-06-26 LAB — URINALYSIS, COMPLETE
Bilirubin, UA: NEGATIVE
GLUCOSE, UA: NEGATIVE
Ketones, UA: NEGATIVE
Nitrite, UA: POSITIVE — AB
Protein, UA: NEGATIVE
RBC, UA: NEGATIVE
Specific Gravity, UA: 1.02 (ref 1.005–1.030)
Urobilinogen, Ur: 1 mg/dL (ref 0.2–1.0)
pH, UA: 6 (ref 5.0–7.5)

## 2018-06-26 LAB — MICROSCOPIC EXAMINATION
Epithelial Cells (non renal): NONE SEEN /hpf (ref 0–10)
RBC, UA: NONE SEEN /hpf (ref 0–2)

## 2018-06-26 MED ORDER — SULFAMETHOXAZOLE-TRIMETHOPRIM 800-160 MG PO TABS: 1.0000 | ORAL_TABLET | Freq: Two times a day (BID) | ORAL | 0 refills | Status: DC

## 2018-06-26 NOTE — Progress Notes (Signed)
Patient presents today with care giver. Care giver states patient has urinary frequency and lower abdominal pain. Care giver states he has not been on ABX or had any Urological surgeries in the last 30 days. A urine was collected by Catheter for UA, UCX. Larene Beach Reviewed the UA in office and Septra was sent to the pharmacy.  In and Out Catheterization  Patient is present today for a I & O catheterization due to urinary frequency. Patient was cleaned and prepped in a sterile fashion with betadine and Lidocaine 2% jelly was instilled into the urethra.  A 14FR cath was inserted no complications were noted , 22ml of urine return was noted, urine was yellow in color. A clean urine sample was collected for UA, UCX. Bladder was drained  And catheter was removed with out difficulty.    Preformed by: Elberta Leatherwood, CMA

## 2018-07-01 ENCOUNTER — Telehealth: Payer: Self-pay | Admitting: Urology

## 2018-07-01 ENCOUNTER — Telehealth: Payer: Self-pay

## 2018-07-01 LAB — CULTURE, URINE COMPREHENSIVE

## 2018-07-01 MED ORDER — NITROFURANTOIN MONOHYD MACRO 100 MG PO CAPS: 100.0000 mg | ORAL_CAPSULE | Freq: Two times a day (BID) | ORAL | 0 refills | Status: AC

## 2018-07-01 NOTE — Telephone Encounter (Signed)
Shay from [employer] called and requests a call back about the meds that were changed. Please call at [phone] The fax # there is [phone]

## 2018-07-01 NOTE — Telephone Encounter (Signed)
-----   Message from Nori Riis, PA-C sent at 07/01/2018  7:53 AM EDT ----- Please let [name] know that his urine culture grew out two different kinds of bacteria.  He will need to stop the Septra DS and start Macrobid 100 mg, BID x 7 days.

## 2018-07-01 NOTE — Telephone Encounter (Signed)
Spoke with Scientist, clinical (histocompatibility and immunogenetics) from [employer]. Written signed order to d/c Septra be fazed to [phone]. Relayed to [facility] for order.

## 2018-07-01 NOTE — Telephone Encounter (Signed)
Called patients primary number and spoke with [name] at [facility] regarding culture results and Rx. Rx sent to pharmacy

## 2018-07-03 ENCOUNTER — Other Ambulatory Visit: Payer: Self-pay | Admitting: Urology

## 2018-07-03 ENCOUNTER — Other Ambulatory Visit: Payer: Self-pay

## 2018-07-03 NOTE — Patient Outreach (Signed)
[facility]) Care Management  07/03/2018  [name] 1941-04-05 [id number]   Medication Adherence call to [name] patient is due on Atorvastatin 40 mg spoke with patient's wife she explain patient is at a home care facility at this time and they provide with all his medication he receives a pill pack every 7 days from the pharmacy. [name] is showing past due under [employer].   [facility] Management Direct Dial [phone]  Fax [phone] [email or web address] [email or web address]

## 2018-07-04 ENCOUNTER — Telehealth: Payer: Self-pay

## 2018-07-04 NOTE — Telephone Encounter (Signed)
Received note on original faxed order from someone named Dylan with no contact info for clarification on Macrobid. Spoke with Isaias Sakai and confirmed course for 7 days only, no extension needed. Shay verbalized understanding and will pass on to [facility]

## 2018-07-05 ENCOUNTER — Inpatient Hospital Stay: Payer: Medicare Other | Attending: Oncology | Admitting: Oncology

## 2018-07-05 ENCOUNTER — Other Ambulatory Visit: Payer: Self-pay

## 2018-07-05 ENCOUNTER — Encounter: Payer: Self-pay | Admitting: Oncology

## 2018-07-05 VITALS — BP 109/73 | HR 69 | Temp 97.2°F | Resp 16 | Ht 67.0 in

## 2018-07-05 DIAGNOSIS — D72819 Decreased white blood cell count, unspecified: Secondary | ICD-10-CM

## 2018-07-05 DIAGNOSIS — I10 Essential (primary) hypertension: Secondary | ICD-10-CM | POA: Insufficient documentation

## 2018-07-05 DIAGNOSIS — G809 Cerebral palsy, unspecified: Secondary | ICD-10-CM | POA: Diagnosis not present

## 2018-07-05 DIAGNOSIS — D709 Neutropenia, unspecified: Secondary | ICD-10-CM

## 2018-07-05 NOTE — Progress Notes (Signed)
Patient here for follow up. Caregiver reports no changes. He is being treated for a UTI.

## 2018-07-09 NOTE — Progress Notes (Signed)
Hematology/Oncology Consult note [facility]  Telephone:([phone] [phone]  Patient Care Team: Steele Sizer, MD as PCP - General (Family Medicine) Steele Sizer, MD (Family Medicine) Alvy Bimler, MD as Consulting Physician (Psychiatry) Abbie Sons, MD (Urology) Lorelee Cover., MD (Ophthalmology) Delana Meyer, Dolores Lory, MD (Vascular Surgery)   Name of the patient: [name]  [id number]  04-05-1941   Date of visit: 07/09/18  Diagnosis-neutropenia transient resolved  Chief complaint/ Reason for visit-discuss results of blood work  Heme/Onc history: Patient is a 78 year old male was been referred to [place] for leukopenia.  Most recent CBC on 06/05/2018 showed a white count of 3.5, H&H of 12.5/35.9 and a platelet count of 131.  Differential on the CBC was essentially normal.  His past medical history significant for hypertension, cerebral palsy, history of  depression.  He lives in a nursing home and is here with his caregiver today who reports that he has not had any acute health issues at the nursing home recently.  There have been no recent changes to his medications and he has not been taking any over-the-counter medications or herbal medications.  He also sees urology for recurrent UTIs.  Results of blood work from 06/21/2018 were as follows: CBC showed white count of 5.5 with a normal differential and an ANC of 4.0.  H&H 12.8/37.1 and a platelet count of 158.  Hepatitis C testing was negative.  Hepatitis B negative also suggesting patient has not been vaccinated for it so far.  Iron studies were within normal limits.  Haptoglobin was normal.  TSH was normal.  Multiple myeloma panel showed no M protein.  Smear review showed adequate platelets and normal WBC and RBC morphology.  Interval history-no acute changes since his last visit.  Denies any recurrent infections or hospitalizations.  ECOG PS- 3 Pain scale- 0   Review of  systems- Review of Systems  Constitutional: Positive for malaise/fatigue. Negative for chills, fever and weight loss.  HENT: Negative for congestion, ear discharge and nosebleeds.   Eyes: Negative for blurred vision.  Respiratory: Negative for cough, hemoptysis, sputum production, shortness of breath and wheezing.   Cardiovascular: Negative for chest pain, palpitations, orthopnea and claudication.  Gastrointestinal: Negative for abdominal pain, blood in stool, constipation, diarrhea, heartburn, melena, nausea and vomiting.  Genitourinary: Negative for dysuria, flank pain, frequency, hematuria and urgency.  Musculoskeletal: Negative for back pain, joint pain and myalgias.  Skin: Negative for rash.  Neurological: Negative for dizziness, tingling, focal weakness, seizures, weakness and headaches.  Endo/Heme/Allergies: Does not bruise/bleed easily.  Psychiatric/Behavioral: Negative for depression and suicidal ideas. The patient does not have insomnia.      No Known Allergies   Past Medical History:  Diagnosis Date  . Anxiety   . Atrophy, disuse, muscle   . BPH (benign prostatic hyperplasia)   . Cerebral palsy ([place])   . Eczema   . Elevated lipids   . Elevated PSA   . Exotropia   . Hematuria, gross   . Hyperlipidemia   . Incontinence   . Kidney stone   . Lower extremity edema    lymph drainage problems  . Lower urinary tract symptoms   . Lymphedema   . Mental retardation   . Over weight   . Prostate cancer ([place])   . Urinary frequency   . Urinary urgency   . UTI (urinary tract infection)   . Venous insufficiency   . Vitamin D deficiency   . Wheelchair dependence  Past Surgical History:  Procedure Laterality Date  . CYSTOSCOPY/URETEROSCOPY/HOLMIUM LASER/STENT PLACEMENT Right 08/14/2017   Procedure: CYSTOSCOPY/URETEROSCOPY/HOLMIUM LASER/STENT PLACEMENT;  Surgeon: Abbie Sons, MD;  Location: [facility] ORS;  Service: Urology;  Laterality: Right;  . leg circulation surgery  Right   . PROSTATE SURGERY  11/10/2008   BRACHYTHERAPY    Social History   Socioeconomic History  . Marital status: Single    Spouse name: Not on file  . Number of children: Not on file  . Years of education: Not on file  . Highest education level: Not on file  Occupational History  . Not on file  Social Needs  . Financial resource strain: Not hard at all  . Food insecurity:    Worry: Never true    Inability: Never true  . Transportation needs:    Medical: No    Non-medical: No  Tobacco Use  . Smoking status: Never Smoker  . Smokeless tobacco: Never Used  Substance and Sexual Activity  . Alcohol use: No    Alcohol/week: 0.0 standard drinks  . Drug use: No  . Sexual activity: Not Currently  Lifestyle  . Physical activity:    Days per week: 0 days    Minutes per session: 0 min  . Stress: Not at all  Relationships  . Social connections:    Talks on phone: Never    Gets together: Never    Attends religious service: Never    Active member of club or organization: No    Attends meetings of clubs or organizations: Never    Relationship status: Never married  . Intimate partner violence:    Fear of current or ex partner: Patient refused    Emotionally abused: Patient refused    Physically abused: Patient refused    Forced sexual activity: Patient refused  Other Topics Concern  . Not on file  Social History Narrative   Lives at [facility] group home, 5 males.         Family History  Problem Relation Age of Onset  . Heart attack Brother   . Heart disease Other      Current Outpatient Medications:  .  alfuzosin (UROXATRAL) 10 MG 24 hr tablet, TAKE 1 TABLET (10MG) BY MOUTH ONCE DAILY WITH BREAKFAST*DO NOT CRUSH*, Disp: 28 tablet, Rfl: 5 .  ALPRAZolam (XANAX) 0.25 MG tablet, TAKE ONE TABLET BY MOUTH 2 TIMES A DAY AS NEEDED FOR ANXIETY (WHEN BLOOD PRESSURE IS VERY HIGH OR ANXIOUS), Disp: 20 tablet, Rfl: 0 .  AMITIZA 24 MCG capsule, TAKE ONE CAPSULE BY MOUTH TWO  TIMES A DAY WITH A MEAL, Disp: 56 capsule, Rfl: 5 .  ARTIFICIAL TEAR SOLUTION OP, Apply 2 drops to eye 2 (two) times daily as needed (dry eyes)., Disp: , Rfl:  .  aspirin (ASPIRIN ADULT LOW STRENGTH) 81 MG EC tablet, TAKE ONE TABLET BY MOUTH EVERY DAY, Disp: 28 tablet, Rfl: 5 .  atorvastatin (LIPITOR) 40 MG tablet, TAKE 1 TABLET BY MOUTH EACH EVENING, Disp: 28 tablet, Rfl: 10 .  betamethasone dipropionate (DIPROLENE) 0.05 % cream, Apply 1 application topically as needed (rash)., Disp: 45 g, Rfl: 0 .  Calcium Carb-Cholecalciferol 500-400 MG-UNIT TABS, TAKE ONE TABLET BY MOUTH 2 TIMES A DAY, Disp: 60 tablet, Rfl: 12 .  chlorproMAZINE (THORAZINE) 25 MG tablet, Take 1 tablet (25 mg total) by mouth 3 (three) times daily for 5 days., Disp: 15 tablet, Rfl: 0 .  Cranberry Juice Powder 425 MG CAPS, TAKE ONE CAPSULE BY MOUTH EVERY  DAY, Disp: 28 capsule, Rfl: 5 .  Elastic Bandages & Supports (MEDICAL COMPRESSION STOCKINGS) MISC, 1 Units by Does not apply route daily., Disp: 2 each, Rfl: 0 .  Incontinence Supply Disposable (DEPEND SILHOUETTE BRIEFS L/XL) MISC, 1 each by Does not apply route 2 (two) times daily as needed., Disp: 60 each, Rfl: 5 .  Polyethyl Glycol-Propyl Glycol (SYSTANE ULTRA) 0.4-0.3 % SOLN, Apply 1 drop to eye 2 (two) times daily. Both eyes, Disp: , Rfl:  .  sertraline (ZOLOFT) 100 MG tablet, TAKE 1 TABLET BY MOUTH ONCE DAILY, Disp: 31 tablet, Rfl: 10 .  vitamin C (ASCORBIC ACID) 500 MG tablet, TAKE ONE TABLET BY MOUTH TWO TIMES A DAY, Disp: 56 tablet, Rfl: 10  Physical exam:  Vitals:   07/05/18 1036 07/05/18 1044  BP:  109/73  Pulse:  69  Resp: 16   Temp:  (!) 97.2 F (36.2 C)  TempSrc:  Tympanic  Height: _0  (1.702 m)    Physical Exam Constitutional:      General: He is not in acute distress.    Comments: Frail elderly gentleman sitting in a wheelchair.  Appears in no acute distress  HENT:     Head: Normocephalic and atraumatic.  Eyes:     Pupils: Pupils are equal, round,  and reactive to light.  Neck:     Musculoskeletal: Normal range of motion.  Cardiovascular:     Rate and Rhythm: Normal rate and regular rhythm.     Heart sounds: Normal heart sounds.  Pulmonary:     Effort: Pulmonary effort is normal.     Breath sounds: Normal breath sounds.  Abdominal:     General: There is no distension.     Tenderness: There is no abdominal tenderness.     Comments: Limited exam as patient unable to sit up on the examination table  Skin:    General: Skin is warm and dry.  Neurological:     Mental Status: He is alert and oriented to person, place, and time.      CMP Latest Ref Rng & Units 05/24/2018  Glucose 65 - 99 mg/dL 83  BUN 7 - 25 mg/dL 17  Creatinine 0.70 - 1.18 mg/dL 0.77  Sodium 135 - 146 mmol/L 137  Potassium 3.5 - 5.3 mmol/L 4.4  Chloride 98 - 110 mmol/L 104  CO2 20 - 32 mmol/L 26  Calcium 8.6 - 10.3 mg/dL 9.2  Total Protein 6.1 - 8.1 g/dL 6.4  Total Bilirubin 0.2 - 1.2 mg/dL 0.4  Alkaline Phos 40 - 115 U/L -  AST 10 - 35 U/L 21  ALT 9 - 46 U/L 19   CBC Latest Ref Rng & Units 06/21/2018  WBC 4.0 - 10.5 K/uL 5.5  Hemoglobin 13.0 - 17.0 g/dL 12.8(L)  Hematocrit 39.0 - 52.0 % 37.1(L)  Platelets 150 - 400 K/uL 158     Assessment and plan- Patient is a 78 y.o. male referred for leukopenia which was transient and resolved  On repeat blood work done on 06/21/2018 patient was noted to have a normal white count with a normal differential.  H&H and platelet counts are also normal.  Hepatitis B and C testing was unremarkable and anemia work-up was also unrevealing.  Suspect the leukopenia was transient and given that his overall counts are normal I am inclined to monitor this conservatively without the need for bone marrow biopsy at this time.  I will see him back in 6 months with a repeat CBC with  Visit Diagnosis 1. Leukopenia, unspecified type      Dr. Randa Evens, MD, MPH [facility] at [facility] [phone] 07/09/2018 10:09  AM

## 2018-08-15 ENCOUNTER — Encounter: Payer: Self-pay | Admitting: Family Medicine

## 2018-08-15 ENCOUNTER — Encounter: Payer: Self-pay | Admitting: Nurse Practitioner

## 2018-08-20 ENCOUNTER — Other Ambulatory Visit: Payer: Self-pay

## 2018-08-20 ENCOUNTER — Encounter: Payer: Self-pay | Admitting: Family Medicine

## 2018-08-20 ENCOUNTER — Ambulatory Visit (INDEPENDENT_AMBULATORY_CARE_PROVIDER_SITE_OTHER): Payer: Medicare Other | Admitting: Family Medicine

## 2018-08-20 DIAGNOSIS — N3000 Acute cystitis without hematuria: Secondary | ICD-10-CM | POA: Diagnosis not present

## 2018-08-20 MED ORDER — SULFAMETHOXAZOLE-TRIMETHOPRIM 800-160 MG PO TABS: 1.0000 | ORAL_TABLET | Freq: Two times a day (BID) | ORAL | 0 refills | Status: AC

## 2018-08-20 NOTE — Progress Notes (Signed)
Name: [name]   MRN: [id number]    DOB: 1940-06-01   Date:08/20/2018       Progress Note  Subjective  Chief Complaint  Chief Complaint  Patient presents with  . Urinary Tract Infection    urine smells    I connected with  [name] on 08/20/18 at 12:40 PM EDT by telephone and verified that I am speaking with the correct person using two identifiers.   I discussed the limitations, risks, security and privacy concerns of performing an evaluation and management service by telephone and the availability of in person appointments. Staff also discussed with the patient that there may be a patient responsible charge related to this service. [facility] Provider Location: Home Additional Individuals present: Peggy with [name]  HPI  He has history of recurrent UTI's has been seeing Dr. Bernardo Heater - last urine culture found e. Coli in March.  Patient's urine is dark, has foul odor, and has been irritable with staff, at night he is grimacing in pain before falling asleep. No report of fevers, no vomiting, no diarrhea, no frank hematuria; does have history of nephrolithiasis.  No  Lethargy or decrease in appetite, drinking plenty of fluids (staff is encouraging this).  Patient Active Problem List   Diagnosis Date Noted  . Renal mass 07/23/2017  . Ureteral calculus 07/23/2017  . PAD (peripheral artery disease) ([place]) 07/04/2017  . Aortic valve calcification 07/04/2017  . Coronary artery calcification 07/04/2017  . Hypertension 10/26/2016  . Late effect acute polio 05/17/2016  . Chronic constipation 11/19/2015  . Mild major depression ([place]) 11/19/2015  . Complicated grieving 67/89/3810  . Recurrent UTI 09/11/2015  . Adenocarcinoma of prostate ([place]) 03/02/2015  . Amyotrophia 03/02/2015  . Edema leg 03/02/2015  . Cerebral palsy ([place]) 03/02/2015  . Dyslipidemia 03/02/2015  . Dermatitis, eczematoid 03/02/2015  . 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 H/O acute  poliomyelitis 03/02/2015  . Lymphedema 03/02/2015  . Mental retardation 03/02/2015  . Hypertrophy of nail 03/02/2015  . Adiposity 03/02/2015  . Absence of bladder continence 03/02/2015  . Chronic venous insufficiency 03/02/2015  . Avitaminosis D 03/02/2015  . Adynamia 03/02/2015  . Dependent on wheelchair 03/02/2015  . Kidney cysts 03/02/2015  . At risk for falling 03/02/2015  . Cerebral vascular disease 03/02/2015  . Strabismus 02/11/2015  . Urge incontinence of urine 02/11/2015  . Wheelchair dependence 02/11/2015  . Obesity 02/11/2015  . Urinary frequency 10/30/2014   Social History   Tobacco Use  . Smoking status: Never Smoker  . Smokeless tobacco: Never Used  Substance Use Topics  . Alcohol use: No    Alcohol/week: 0.0 standard drinks     Current Outpatient Medications:  .  alfuzosin (UROXATRAL) 10 MG 24 hr tablet, TAKE 1 TABLET (10MG ) BY MOUTH ONCE DAILY WITH BREAKFAST*DO NOT CRUSH*, Disp: 28 tablet, Rfl: 5 .  ALPRAZolam (XANAX) 0.25 MG tablet, TAKE ONE TABLET BY MOUTH 2 TIMES A DAY AS NEEDED FOR ANXIETY (WHEN BLOOD PRESSURE IS VERY HIGH OR ANXIOUS), Disp: 20 tablet, Rfl: 0 .  AMITIZA 24 MCG capsule, TAKE ONE CAPSULE BY MOUTH TWO TIMES A DAY WITH A MEAL, Disp: 56 capsule, Rfl: 5 .  ARTIFICIAL TEAR SOLUTION OP, Apply 2 drops to eye 2 (two) times daily as needed (dry eyes)., Disp: , Rfl:  .  aspirin (ASPIRIN ADULT LOW STRENGTH) 81 MG EC tablet, TAKE ONE TABLET BY MOUTH EVERY DAY, Disp: 28 tablet, Rfl: 5 .  atorvastatin (LIPITOR) 40 MG  tablet, TAKE 1 TABLET BY MOUTH EACH EVENING, Disp: 28 tablet, Rfl: 10 .  betamethasone dipropionate (DIPROLENE) 0.05 % cream, Apply 1 application topically as needed (rash)., Disp: 45 g, Rfl: 0 .  Calcium Carb-Cholecalciferol 500-400 MG-UNIT TABS, TAKE ONE TABLET BY MOUTH 2 TIMES A DAY, Disp: 60 tablet, Rfl: 12 .  Cranberry Juice Powder 425 MG CAPS, TAKE ONE CAPSULE BY MOUTH EVERY DAY, Disp: 28 capsule, Rfl: 5 .  Elastic Bandages & Supports  (MEDICAL COMPRESSION STOCKINGS) MISC, 1 Units by Does not apply route daily., Disp: 2 each, Rfl: 0 .  Incontinence Supply Disposable (DEPEND SILHOUETTE BRIEFS L/XL) MISC, 1 each by Does not apply route 2 (two) times daily as needed., Disp: 60 each, Rfl: 5 .  Polyethyl Glycol-Propyl Glycol (SYSTANE ULTRA) 0.4-0.3 % SOLN, Apply 1 drop to eye 2 (two) times daily. Both eyes, Disp: , Rfl:  .  sertraline (ZOLOFT) 100 MG tablet, TAKE 1 TABLET BY MOUTH ONCE DAILY, Disp: 31 tablet, Rfl: 10 .  vitamin C (ASCORBIC ACID) 500 MG tablet, TAKE ONE TABLET BY MOUTH TWO TIMES A DAY, Disp: 56 tablet, Rfl: 10 .  chlorproMAZINE (THORAZINE) 25 MG tablet, Take 1 tablet (25 mg total) by mouth 3 (three) times daily for 5 days., Disp: 15 tablet, Rfl: 0  No Known Allergies  I personally reviewed active problem list, medication list, allergies, notes from last encounter, lab results with the patient/caregiver today.  ROS  Ten systems reviewed and is negative except as mentioned in HPI  Objective  Virtual encounter, vitals not obtained.  There is no height or weight on file to calculate BMI.  Nursing Note and Vital Signs reviewed.  Physical Exam  Constitutional: Patient appears well-developed and well-nourished. No distress.  HENT: Head: Normocephalic and atraumatic.  Neck: Normal range of motion. Pulmonary/Chest: Effort normal. No respiratory distress. Speaking in complete sentences Neurological: Pt is alert and oriented to person, place, and time. Coordination, speech and gait are normal.  Psychiatric: Patient has a normal mood and affect. behavior is normal. Judgment and thought content normal. Abdominal: Caregiver palpates abdomen while I am on the phone, and she states he does not have tenderness/does not grimace when she palpates his abdomen.   No results found for this or any previous visit (from the past 72 hour(s)).  Assessment & Plan  1. Acute cystitis without hematuria - Urine Culture - will hold  antibiotic until they obtain urine specimen today. - sulfamethoxazole-trimethoprim (BACTRIM DS) 800-160 MG tablet; Take 1 tablet by mouth 2 (two) times daily for 7 days.  Dispense: 14 tablet; Refill: 0 - We will initiate empiric therapy as patient has had recurrent UTI's and is unable to communicate clearly his symptoms.  We will try to obtain culture prior to staff initiating antibiotic therapy.  -Red flags and when to present for emergency care or RTC including fever >100.75F, chest pain, shortness of breath, new/worsening/un-resolving symptoms, lethargic, change in behavior or appetite, or blood in urine reviewed with patient at time of visit. Follow up and care instructions discussed and provided in AVS. - I discussed the assessment and treatment plan with the patient's caregiver. The patient was provided an opportunity to ask questions and all were answered. The patient agreed with the plan and demonstrated an understanding of the instructions.  - The patient was advised to call back or seek an in-person evaluation if the symptoms worsen or if the condition fails to improve as anticipated.  I provided 16 minutes of non-face-to-face time during this  encounter.  Hubbard Hartshorn, FNP

## 2018-08-22 ENCOUNTER — Telehealth: Payer: Self-pay | Admitting: Family Medicine

## 2018-08-22 LAB — URINE CULTURE
MICRO NUMBER:: 429245
SPECIMEN QUALITY:: ADEQUATE

## 2018-08-22 NOTE — Telephone Encounter (Signed)
Copied from [place] [id number]. Topic: Quick Communication - See Telephone Encounter >> Aug 22, 2018 10:39 AM Nils Flack wrote: CRM for notification. See Telephone encounter for: 08/22/18 [name] would like clarity - should they start the antibiotic now, or wait until the results come back from urinalysis? Cb is [phone]

## 2018-08-22 NOTE — Telephone Encounter (Signed)
Spoke to [employer] at [facility]

## 2018-09-13 ENCOUNTER — Other Ambulatory Visit: Payer: Self-pay | Admitting: Family Medicine

## 2018-09-13 DIAGNOSIS — K5909 Other constipation: Secondary | ICD-10-CM

## 2018-09-13 DIAGNOSIS — R350 Frequency of micturition: Secondary | ICD-10-CM

## 2018-09-21 ENCOUNTER — Other Ambulatory Visit: Payer: Self-pay | Admitting: Family Medicine

## 2018-09-21 DIAGNOSIS — K5909 Other constipation: Secondary | ICD-10-CM

## 2018-09-21 DIAGNOSIS — R350 Frequency of micturition: Secondary | ICD-10-CM

## 2018-10-03 ENCOUNTER — Encounter: Payer: Self-pay | Admitting: Family Medicine

## 2018-10-03 ENCOUNTER — Other Ambulatory Visit: Payer: Self-pay

## 2018-10-03 ENCOUNTER — Ambulatory Visit (INDEPENDENT_AMBULATORY_CARE_PROVIDER_SITE_OTHER): Payer: Medicare Other | Admitting: Family Medicine

## 2018-10-03 VITALS — BP 124/80 | HR 65 | Temp 97.9°F | Resp 16 | Ht 67.0 in

## 2018-10-03 DIAGNOSIS — R8290 Unspecified abnormal findings in urine: Secondary | ICD-10-CM | POA: Diagnosis not present

## 2018-10-03 DIAGNOSIS — N390 Urinary tract infection, site not specified: Secondary | ICD-10-CM

## 2018-10-03 LAB — POCT URINALYSIS DIPSTICK
Appearance: ABNORMAL
Bilirubin, UA: NEGATIVE
Blood, UA: NEGATIVE
Glucose, UA: NEGATIVE
Ketones, UA: NEGATIVE
Leukocytes, UA: NEGATIVE
Nitrite, UA: NEGATIVE
Protein, UA: NEGATIVE
Spec Grav, UA: 1.025 (ref 1.010–1.025)
Urobilinogen, UA: 0.2 E.U./dL
pH, UA: 6.5 (ref 5.0–8.0)

## 2018-10-03 MED ORDER — AMOXICILLIN-POT CLAVULANATE 875-125 MG PO TABS: 1.0000 | ORAL_TABLET | Freq: Two times a day (BID) | ORAL | 0 refills | Status: DC

## 2018-10-03 MED ORDER — AMOXICILLIN-POT CLAVULANATE 875-125 MG PO TABS: 1.0000 | ORAL_TABLET | Freq: Two times a day (BID) | ORAL | 0 refills | Status: AC

## 2018-10-03 NOTE — Progress Notes (Signed)
Name: [name]   MRN: [id number]    DOB: [birth year]   Date:10/03/2018       Progress Note  Subjective  Chief Complaint  Chief Complaint  Patient presents with  . Back Pain    Nurse Aid thinks that he might have a UTI. He has been complaining of back pain when they lay him down. He has weakness and fatigue.    HPI  PT presents with concern for possible UTI with [name] representative.  He has been having more complaints of low back pain, fussiness, darker urine, foul smelling urine, somewhat decreased appetite, and some weakness (not rolling his own wheelchair as often as normal).  No fevers, no reports of nausea or abdominal pain, no vomiting. Has history of kidney stones and recurrent UTI.  No blood in urine today.  E. Coli has been present now for at least the last 2 cultures - appears to be similar strain as both show similar resistance patterns.  He was treated in late April 2020 with bactrim which did seem to treat his symptoms completely.  To avoid using the same antibiotic so close together, we will use Augmentin instead  Patient Active Problem List   Diagnosis Date Noted  . Renal mass 07/23/2017  . Ureteral calculus 07/23/2017  . PAD (peripheral artery disease) ([place]) 07/04/2017  . Aortic valve calcification 07/04/2017  . Coronary artery calcification 07/04/2017  . Hypertension 10/26/2016  . Late effect acute polio 05/17/2016  . Chronic constipation 11/19/2015  . Mild major depression ([place]) 11/19/2015  . Complicated grieving 20/94/7096  . Recurrent UTI 09/11/2015  . Adenocarcinoma of prostate ([place]) 03/02/2015  . Amyotrophia 03/02/2015  . Edema leg 03/02/2015  . Cerebral palsy ([place]) 03/02/2015  . Dyslipidemia 03/02/2015  . Dermatitis, eczematoid 03/02/2015  . 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 H/O acute poliomyelitis 03/02/2015  . Lymphedema 03/02/2015  . Mental retardation 03/02/2015  . Hypertrophy of nail 03/02/2015  . Adiposity 03/02/2015  . Absence of  bladder continence 03/02/2015  . Chronic venous insufficiency 03/02/2015  . Avitaminosis D 03/02/2015  . Adynamia 03/02/2015  . Dependent on wheelchair 03/02/2015  . Kidney cysts 03/02/2015  . At risk for falling 03/02/2015  . Cerebral vascular disease 03/02/2015  . Strabismus 02/11/2015  . Urge incontinence of urine 02/11/2015  . Wheelchair dependence 02/11/2015  . Obesity 02/11/2015  . Urinary frequency 10/30/2014    Social History   Tobacco Use  . Smoking status: Never Smoker  . Smokeless tobacco: Never Used  Substance Use Topics  . Alcohol use: No    Alcohol/week: 0.0 standard drinks     Current Outpatient Medications:  .  alfuzosin (UROXATRAL) 10 MG 24 hr tablet, TAKE 1 TABLET (10MG ) BY MOUTH ONCE DAILY WITH BREAKFAST*DO NOT CRUSH*, Disp: 31 tablet, Rfl: 10 .  ALPRAZolam (XANAX) 0.25 MG tablet, TAKE ONE TABLET BY MOUTH 2 TIMES A DAY AS NEEDED FOR ANXIETY (WHEN BLOOD PRESSURE IS VERY HIGH OR ANXIOUS), Disp: 20 tablet, Rfl: 0 .  AMITIZA 24 MCG capsule, TAKE ONE CAPSULE BY MOUTH TWO TIMES A DAY WITH A MEAL, Disp: 62 capsule, Rfl: 10 .  ARTIFICIAL TEAR SOLUTION OP, Apply 2 drops to eye 2 (two) times daily as needed (dry eyes)., Disp: , Rfl:  .  aspirin (ASPIRIN ADULT LOW STRENGTH) 81 MG EC tablet, TAKE ONE TABLET BY MOUTH EVERY DAY *DO NOT CRUSH*, Disp: 31 tablet, Rfl: 10 .  atorvastatin (LIPITOR) 40 MG tablet, TAKE 1 TABLET BY MOUTH EACH EVENING, Disp:  28 tablet, Rfl: 10 .  betamethasone dipropionate (DIPROLENE) 0.05 % cream, Apply 1 application topically as needed (rash)., Disp: 45 g, Rfl: 0 .  Calcium Carb-Cholecalciferol 500-400 MG-UNIT TABS, TAKE ONE TABLET BY MOUTH 2 TIMES A DAY, Disp: 60 tablet, Rfl: 12 .  Cranberry Juice Powder 425 MG CAPS, TAKE ONE CAPSULE BY MOUTH EVERY DAY, Disp: 31 capsule, Rfl: 10 .  Elastic Bandages & Supports (MEDICAL COMPRESSION STOCKINGS) MISC, 1 Units by Does not apply route daily., Disp: 2 each, Rfl: 0 .  Incontinence Supply Disposable (DEPEND  SILHOUETTE BRIEFS L/XL) MISC, 1 each by Does not apply route 2 (two) times daily as needed., Disp: 60 each, Rfl: 5 .  Polyethyl Glycol-Propyl Glycol (SYSTANE ULTRA) 0.4-0.3 % SOLN, Apply 1 drop to eye 2 (two) times daily. Both eyes, Disp: , Rfl:  .  sertraline (ZOLOFT) 100 MG tablet, TAKE 1 TABLET BY MOUTH ONCE DAILY, Disp: 31 tablet, Rfl: 10 .  vitamin C (ASCORBIC ACID) 500 MG tablet, TAKE ONE TABLET BY MOUTH TWO TIMES A DAY, Disp: 56 tablet, Rfl: 10 .  chlorproMAZINE (THORAZINE) 25 MG tablet, Take 1 tablet (25 mg total) by mouth 3 (three) times daily for 5 days., Disp: 15 tablet, Rfl: 0  No Known Allergies  I personally reviewed active problem list, medication list, allergies, notes from last encounter, lab results with the patient/caregiver today.  ROS  Ten systems reviewed and is negative except as mentioned in HPI  Objective  Vitals:   10/03/18 1030  BP: 124/80  Pulse: 65  Resp: 16  Temp: 97.9 F (36.6 C)  TempSrc: Oral  SpO2: 99%  Height: 5\' 7"  (1.702 m)   Body mass index is 29.6 kg/m.  Nursing Note and Vital Signs reviewed.  Physical Exam  Constitutional: Patient appears well-developed and well-nourished. Obese. No distress.  HEENT: head atraumatic, normocephalic Cardiovascular: Normal rate, regular rhythm and normal heart sounds.  No murmur heard.  Pulmonary/Chest: Effort normal and breath sounds clear bilaterally. No respiratory distress. Abdominal: Soft, bowel sounds normal, there is no tenderness, no HSM No CVA tenderness. Psychiatric: Patient has a normal mood and affect. behavior is normal. Judgment and thought content normal.  Results for orders placed or performed in visit on 10/03/18 (from the past 72 hour(s))  POCT Urinalysis Dipstick     Status: Normal   Collection Time: 10/03/18 10:32 AM  Result Value Ref Range   Color, UA orange    Clarity, UA Cloudy    Glucose, UA Negative Negative   Bilirubin, UA Negative    Ketones, UA Negative    Spec Grav, UA  1.025 1.010 - 1.025   Blood, UA Negative    pH, UA 6.5 5.0 - 8.0   Protein, UA Negative Negative   Urobilinogen, UA 0.2 0.2 or 1.0 E.U./dL   Nitrite, UA Negative    Leukocytes, UA Negative Negative   Appearance abnormal    Odor abnomal     Assessment & Plan   1. Foul smelling urine - POCT Urinalysis Dipstick - Urine Culture - Ambulatory referral to Urology - amoxicillin-clavulanate (AUGMENTIN) 875-125 MG tablet; Take 1 tablet by mouth 2 (two) times daily for 5 days.  Dispense: 10 tablet; Refill: 0  2. Recurrent UTI (urinary tract infection) - POCT Urinalysis Dipstick - Ambulatory referral to Urology  -Red flags and when to present for emergency care or RTC including fever >101.59F, chest pain, shortness of breath, new/worsening/un-resolving symptoms, reviewed with patient at time of visit. Follow up and care instructions discussed  and provided in AVS.

## 2018-10-06 LAB — URINE CULTURE
MICRO NUMBER:: 565211
SPECIMEN QUALITY:: ADEQUATE

## 2018-10-07 ENCOUNTER — Telehealth: Payer: Self-pay

## 2018-10-07 NOTE — Telephone Encounter (Signed)
Copied from [place] [phone]. Topic: General - Other >> Oct 07, 2018 11:51 AM Carolyn Stare wrote: [name] with [name] req a copy of labs be fax to the office  Fax number [phone]

## 2018-10-07 NOTE — Telephone Encounter (Signed)
I only sent this to you because I needed to close it. But it has been mailed.

## 2018-10-15 NOTE — Progress Notes (Signed)
10/16/2018 12:53 PM   [name] [birth year] [id number]  Referring provider: Steele Sizer, MD [address] [place],  [place] [address]  Chief Complaint  Patient presents with  . Recurrent UTI    HPI: [name] is a 78 year old male with a history of prostate cancer, a history of nephrolithiasis, BPH with LU TS and rUTI's who presents today as a referral from Raelyn Ensign, [place] for foul smelling urine.  UTI hx 03/22/2018 - multiple species 05/15/2018 - multiple species 06/25/2000 - E. Coli resistant to ampicillin, cipro and levaquin                      Enterococcus faecalis 08/20/2018 - E. Coli resistant to ampicillin, cipro and levaquin 10/03/2018 - Enterococcus faecalis  KUB in 01/2018 demonstrated constipation, but no stones.  KUB 10/16/2018 Moderate stool burden. Correlate clinically for constipation. Post treatment sequelae for prostate cancer.  His symptoms of an UTI are foul smelling urine, agitation, slumping in his chair and lower back pain. Staff denies any gross hematuria, dysuria or suprapubic/flank pain.  Patient denies any fevers, chills, nausea or vomiting.   Staff states the urine smells like rotten eggs.  His CATH UA today is nitrite positive, 11-30 WBC's and many bacteria.    He is not drinking a lot of water.  He drinks mostly coffees and teas.        PMH: Past Medical History:  Diagnosis Date  . Anxiety   . Atrophy, disuse, muscle   . BPH (benign prostatic hyperplasia)   . Cerebral palsy ([place])   . Eczema   . Elevated lipids   . Elevated PSA   . Exotropia   . Hematuria, gross   . Hyperlipidemia   . Incontinence   . Kidney stone   . Lower extremity edema    lymph drainage problems  . Lower urinary tract symptoms   . Lymphedema   . Mental retardation   . Over weight   . Prostate cancer ([place])   . Urinary frequency   . Urinary urgency   . UTI (urinary tract infection)   . Venous insufficiency   . Vitamin D deficiency    . Wheelchair dependence     Surgical History: Past Surgical History:  Procedure Laterality Date  . CYSTOSCOPY/URETEROSCOPY/HOLMIUM LASER/STENT PLACEMENT Right 08/14/2017   Procedure: CYSTOSCOPY/URETEROSCOPY/HOLMIUM LASER/STENT PLACEMENT;  Surgeon: Abbie Sons, MD;  Location: [facility] ORS;  Service: Urology;  Laterality: Right;  . leg circulation surgery Right   . PROSTATE SURGERY  11/10/2008   BRACHYTHERAPY    Home Medications:  Allergies as of 10/16/2018   No Known Allergies     Medication List       Accurate as of October 16, 2018 11:59 PM. If you have any questions, ask your nurse or doctor.        alfuzosin 10 MG 24 hr tablet Commonly known as: UROXATRAL TAKE 1 TABLET (10MG ) BY MOUTH ONCE DAILY WITH BREAKFAST*DO NOT CRUSH*   ALPRAZolam 0.25 MG tablet Commonly known as: XANAX TAKE ONE TABLET BY MOUTH 2 TIMES A DAY AS NEEDED FOR ANXIETY (WHEN BLOOD PRESSURE IS VERY HIGH OR ANXIOUS)   Amitiza 24 MCG capsule Generic drug: lubiprostone TAKE ONE CAPSULE BY MOUTH TWO TIMES A DAY WITH A MEAL   ARTIFICIAL TEAR SOLUTION OP Apply 2 drops to eye 2 (two) times daily as needed (dry eyes).   aspirin 81 MG EC tablet Commonly known as: Aspirin Adult  Low Strength TAKE ONE TABLET BY MOUTH EVERY DAY *DO NOT CRUSH*   atorvastatin 40 MG tablet Commonly known as: LIPITOR TAKE 1 TABLET BY MOUTH EACH EVENING   betamethasone dipropionate 0.05 % cream Commonly known as: DIPROLENE Apply 1 application topically as needed (rash).   Calcium Carb-Cholecalciferol 500-400 MG-UNIT Tabs TAKE ONE TABLET BY MOUTH 2 TIMES A DAY   chlorproMAZINE 25 MG tablet Commonly known as: THORAZINE Take 1 tablet (25 mg total) by mouth 3 (three) times daily for 5 days.   Cranberry Juice Powder 425 MG Caps TAKE ONE CAPSULE BY MOUTH EVERY DAY   Depend Silhouette Briefs L/XL Misc 1 each by Does not apply route 2 (two) times daily as needed.   Medical Compression Stockings Misc 1 Units by Does not apply  route daily.   polyethylene glycol powder 17 GM/SCOOP powder Commonly known as: GLYCOLAX/MIRALAX Take 255 g by mouth once for 1 dose. Started by: Zara Council, PA-C   sertraline 100 MG tablet Commonly known as: ZOLOFT TAKE 1 TABLET BY MOUTH ONCE DAILY   Systane Ultra 0.4-0.3 % Soln Generic drug: Polyethyl Glycol-Propyl Glycol Apply 1 drop to eye 2 (two) times daily. Both eyes   vitamin C 500 MG tablet Commonly known as: ASCORBIC ACID TAKE ONE TABLET BY MOUTH TWO TIMES A DAY       Allergies: No Known Allergies  Family History: Family History  Problem Relation Age of Onset  . Heart attack Brother   . Heart disease Other     Social History:  reports that he has never smoked. He has never used smokeless tobacco. He reports that he does not drink alcohol or use drugs.  ROS: UROLOGY Frequent Urination?: No Hard to postpone urination?: No Burning/pain with urination?: No Get up at night to urinate?: No Leakage of urine?: No Urine stream starts and stops?: No Trouble starting stream?: No Do you have to strain to urinate?: No Blood in urine?: No Urinary tract infection?: No Sexually transmitted disease?: No Injury to kidneys or bladder?: No Painful intercourse?: No Weak stream?: No Erection problems?: No Penile pain?: No  Gastrointestinal Nausea?: No Vomiting?: No Indigestion/heartburn?: No Diarrhea?: No Constipation?: No  Constitutional Fever: No Night sweats?: No Weight loss?: No Fatigue?: No  Skin Skin rash/lesions?: No Itching?: No  Eyes Blurred vision?: No Double vision?: No  Ears/Nose/Throat Sore throat?: No Sinus problems?: No  Hematologic/Lymphatic Swollen glands?: No Easy bruising?: No  Cardiovascular Leg swelling?: No Chest pain?: No  Respiratory Cough?: No Shortness of breath?: No  Endocrine Excessive thirst?: No  Musculoskeletal Back pain?: No Joint pain?: No  Neurological Headaches?: No Dizziness?: No   Psychologic Depression?: No Anxiety?: No  Physical Exam: BP 128/68 (BP Location: Left Arm, Patient Position: Sitting)   Pulse 67   Wt 180 lb (81.6 kg) Comment: per caregiver  BMI 28.19 kg/m   Constitutional:  Well nourished. Alert and oriented, No acute distress. HEENT: [place] AT, moist mucus membranes.  Trachea midline, no masses. Cardiovascular: No clubbing, cyanosis, or edema. Respiratory: Normal respiratory effort, no increased work of breathing. GI: Abdomen is soft, non tender, non distended, no abdominal masses. Liver and spleen not palpable.  No hernias appreciated.  Stool sample for occult testing is not indicated.   GU: No CVA tenderness.  No bladder fullness or masses.  Patient with uncircumcised phallus.  Foreskin easily retracted.   Urethral meatus is patent.  No penile discharge. No penile lesions or rashes. Phallus is found tucked between his legs and it  is brought out to lay on top of legs.  Scrotum without lesions, cysts, rashes and/or edema.  Testicles are located scrotally bilaterally. No masses are appreciated in the testicles. Left and right epididymis are normal. Rectal: Not performed.   Skin: No rashes, bruises or suspicious lesions. Lymph: No inguinal adenopathy. Neurologic: Grossly intact, no focal deficits, moving all 4 extremities. Psychiatric: Normal mood and affect.  Laboratory Data: Lab Results  Component Value Date   WBC 5.5 06/21/2018   HGB 12.8 (L) 06/21/2018   HCT 37.1 (L) 06/21/2018   MCV 95.9 06/21/2018   PLT 158 06/21/2018    Lab Results  Component Value Date   CREATININE 0.77 05/24/2018    No results found for: PSA  No results found for: TESTOSTERONE  Lab Results  Component Value Date   HGBA1C 4.9 11/30/2017    Lab Results  Component Value Date   TSH 2.207 06/21/2018       Component Value Date/Time   CHOL 72 11/30/2017 1040   HDL 34 (L) 11/30/2017 1040   CHOLHDL 2.1 11/30/2017 1040   VLDL 16 11/27/2016 0900   LDLCALC 24  11/30/2017 1040    Lab Results  Component Value Date   AST 21 05/24/2018   Lab Results  Component Value Date   ALT 19 05/24/2018   No components found for: ALKALINEPHOPHATASE No components found for: BILIRUBINTOTAL  No results found for: ESTRADIOL  Urinalysis Nitrite positive.  11-30 WBC's.  Many bacteria.  See Epic.  I have reviewed the labs.   Pertinent Imaging: CLINICAL DATA:  Back pain and foul-smelling urine.  EXAM: ABDOMEN - 1 VIEW  COMPARISON:  02/04/2018.  FINDINGS: The bowel gas pattern is normal. No radio-opaque calculi or other significant radiographic abnormality are seen.Radioactive prostate seeds. Moderate stool burden. Degenerative change lumbar spine. Stable appearance from priors.  IMPRESSION: Moderate stool burden. Correlate clinically for constipation. Post treatment sequelae for prostate cancer.   Electronically Signed   By: Staci Righter M.D.   On: 10/17/2018 08:16 I have independently reviewed the films and note the constipation  In and Out Catheterization Patient is present today for a I & O catheterization due to foul smelling urine. Patient was cleaned and prepped in a sterile fashion with betadine and Lidocaine 2% jelly was instilled into the urethra.  A 14 FR cath was inserted no complications were noted , 150 ml of urine return was noted, urine was dark yellow in color. A clean urine sample was collected for culture. Bladder was drained  And catheter was removed with out difficulty.    Performed by: Zara Council, PA-C   Assessment & Plan:    1. Recurrent UTI Explained to caregiver that [name]' phallus was laying on top of his anus/rectum and this is likely contributing to his rUTI's as the bacteria in his bowels are being transferred into his urethral and he is not making a lot of trips to the restroom.  It is important to make sure the phallus is on top of the legs and away from the anus/rectum. He also has significant  constipation which also contributes to his rUTI's - he is currently taking Amitza and we will restart his MiraLax daily Encouraged more liquid intake besides coffee and tea - maybe he could try Crystal Light or Gatorade    Return for keep follow up with Dr. Bernardo Heater in October .  These notes generated with voice recognition software. I apologize for typographical errors.  Zara Council, PA-C  [facility] [address] [place], [place] [address] [phone]

## 2018-10-16 ENCOUNTER — Encounter: Payer: Self-pay | Admitting: Urology

## 2018-10-16 ENCOUNTER — Ambulatory Visit (INDEPENDENT_AMBULATORY_CARE_PROVIDER_SITE_OTHER): Payer: Medicare Other | Admitting: Urology

## 2018-10-16 ENCOUNTER — Other Ambulatory Visit: Payer: Self-pay

## 2018-10-16 ENCOUNTER — Ambulatory Visit
Admission: RE | Admit: 2018-10-16 | Discharge: 2018-10-16 | Disposition: A | Discharge status: Home / Self Care | Payer: Medicare Other | Source: Ambulatory Visit | Attending: Urology | Admitting: Urology

## 2018-10-16 VITALS — BP 128/68 | HR 67 | Wt 180.0 lb

## 2018-10-16 DIAGNOSIS — K5909 Other constipation: Secondary | ICD-10-CM | POA: Diagnosis not present

## 2018-10-16 DIAGNOSIS — M4726 Other spondylosis with radiculopathy, lumbar region: Secondary | ICD-10-CM | POA: Diagnosis not present

## 2018-10-16 DIAGNOSIS — Z87442 Personal history of urinary calculi: Secondary | ICD-10-CM | POA: Diagnosis not present

## 2018-10-16 DIAGNOSIS — R82998 Other abnormal findings in urine: Secondary | ICD-10-CM | POA: Diagnosis not present

## 2018-10-16 DIAGNOSIS — N390 Urinary tract infection, site not specified: Secondary | ICD-10-CM

## 2018-10-16 DIAGNOSIS — K5641 Fecal impaction: Secondary | ICD-10-CM | POA: Diagnosis not present

## 2018-10-16 DIAGNOSIS — M549 Dorsalgia, unspecified: Secondary | ICD-10-CM | POA: Diagnosis not present

## 2018-10-16 LAB — URINALYSIS, COMPLETE
Bilirubin, UA: NEGATIVE
Glucose, UA: NEGATIVE
Ketones, UA: NEGATIVE
Nitrite, UA: POSITIVE — AB
Protein,UA: NEGATIVE
RBC, UA: NEGATIVE
Specific Gravity, UA: 1.025 (ref 1.005–1.030)
Urobilinogen, Ur: 0.2 mg/dL (ref 0.2–1.0)
pH, UA: 6 (ref 5.0–7.5)

## 2018-10-16 LAB — MICROSCOPIC EXAMINATION: RBC: NONE SEEN /hpf (ref 0–2)

## 2018-10-16 MED ORDER — POLYETHYLENE GLYCOL 3350 17 GM/SCOOP PO POWD: 1.0000 | Freq: Once | ORAL | 0 refills | Status: DC

## 2018-10-17 ENCOUNTER — Telehealth: Payer: Self-pay

## 2018-10-17 NOTE — Telephone Encounter (Signed)
-----   Message from Nori Riis, PA-C sent at 10/16/2018  4:44 PM EDT ----- Please let Bill's caregivers that his UA looks suspicious for infection and it is sent for culture.  We should have the results of the culture on Monday.  It is best to wait on these results before starting an antibiotic as he has had so many infections recently.

## 2018-10-17 NOTE — Telephone Encounter (Signed)
[facility] returned call and would like for [place] to fax U/A culture results to facility on Monday when they should be back.  Fax# [phone]

## 2018-10-17 NOTE — Telephone Encounter (Signed)
Left message for assistant director ( ext 12) to return call to office.

## 2018-10-18 ENCOUNTER — Other Ambulatory Visit: Payer: Self-pay | Admitting: Family Medicine

## 2018-10-18 ENCOUNTER — Telehealth: Payer: Self-pay | Admitting: Urology

## 2018-10-18 LAB — CULTURE, URINE COMPREHENSIVE

## 2018-10-18 MED ORDER — POLYETHYLENE GLYCOL 3350 17 GM/SCOOP PO POWD: 1.0000 | Freq: Once | ORAL | 0 refills | Status: DC

## 2018-10-18 MED ORDER — POLYETHYLENE GLYCOL 3350 17 GM/SCOOP PO POWD: 1.0000 | Freq: Once | ORAL | 0 refills | Status: AC

## 2018-10-18 NOTE — Telephone Encounter (Signed)
Would you call in [name] prescription?  It won't go through [facility].

## 2018-10-18 NOTE — Telephone Encounter (Signed)
RX sent

## 2018-10-21 ENCOUNTER — Telehealth: Payer: Self-pay

## 2018-10-21 MED ORDER — SULFAMETHOXAZOLE-TRIMETHOPRIM 800-160 MG PO TABS: 1.0000 | ORAL_TABLET | Freq: Two times a day (BID) | ORAL | 0 refills | Status: AC

## 2018-10-21 NOTE — Telephone Encounter (Signed)
Shay from [name] returned call requesting order be faxed. Order for Septra DS BIS x 7 days faxed to [phone] RX faxed to pharmacy

## 2018-10-21 NOTE — Telephone Encounter (Signed)
UA, UCX have been faxed to Facility.

## 2018-10-21 NOTE — Telephone Encounter (Signed)
Called and left message for [name], Mud[employer] at [facility], to return call.

## 2018-10-21 NOTE — Telephone Encounter (Signed)
-----   Message from Nori Riis, PA-C sent at 10/21/2018  9:39 AM EDT ----- Please let Mr. Kelly' caregivers know that his urine culture was positive for infection and we need to start Septra DS, BID x 7 days.  Would you also ask if he received his MiraLax prescription?

## 2018-11-21 IMAGING — US US RENAL
1 series · 13 of 25 positions shown · non-contrast
Comparison: Abdominal MRI 08/06/2017

CLINICAL DATA: 76-year-old male with a history of right-sided
ureteral calculus and hydronephrosis.

EXAM:
RENAL / URINARY TRACT ULTRASOUND COMPLETE

[Series 1: us renal · 13 of 80 slices shown]
[im 1/80]
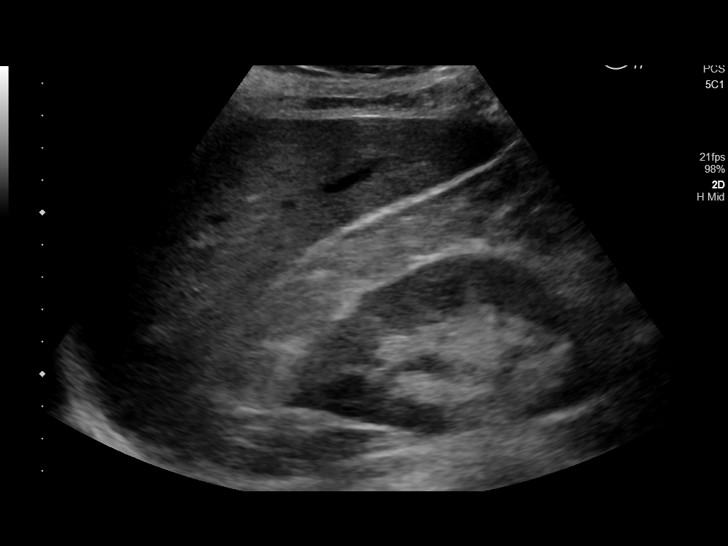
[im 7/80]
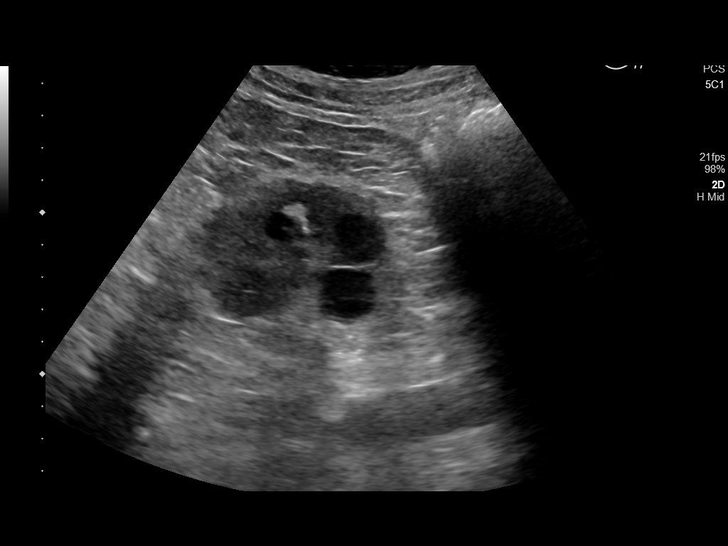
[im 14/80]
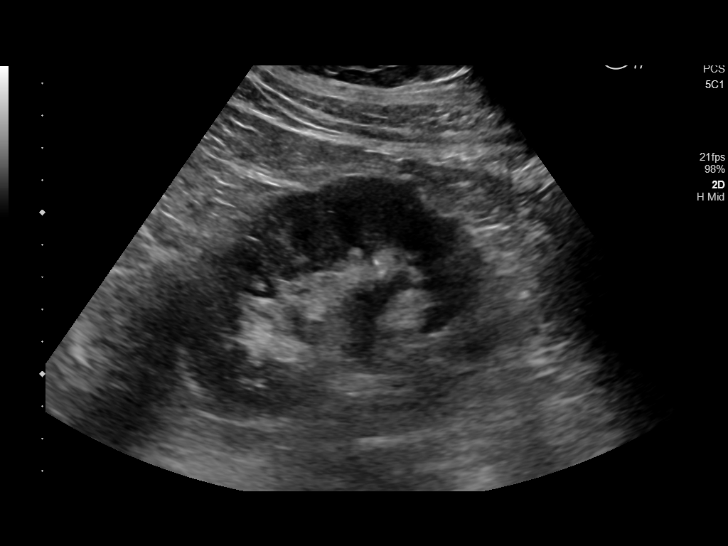
[im 20/80]
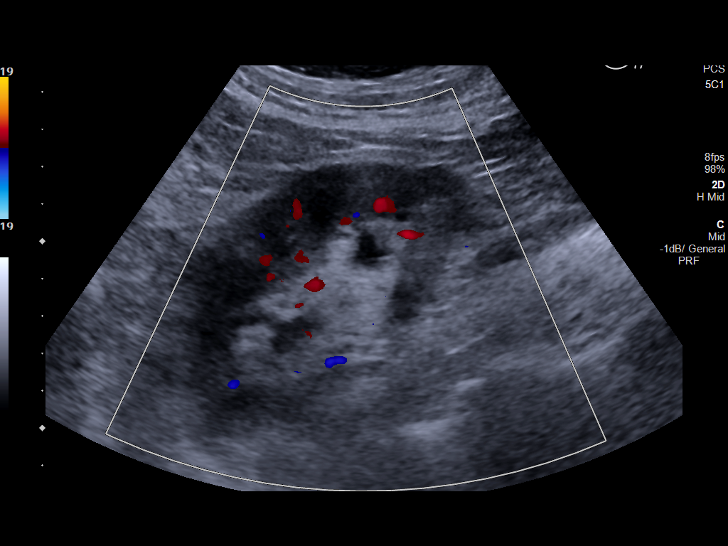
[im 27/80]
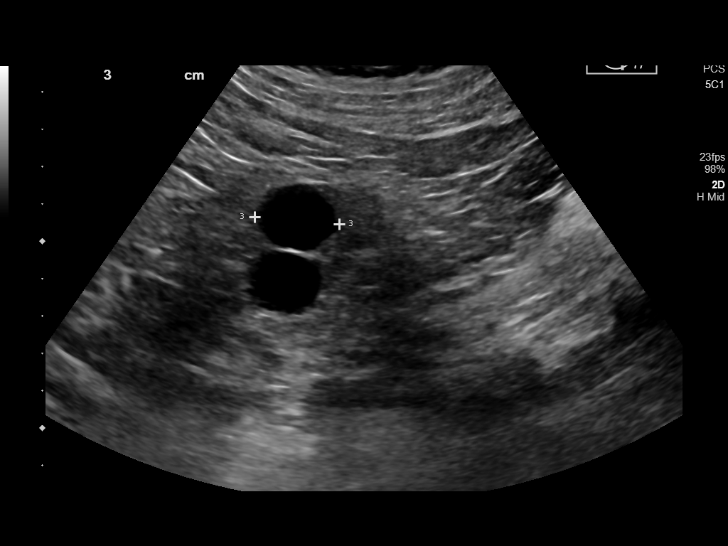
[im 33/80]
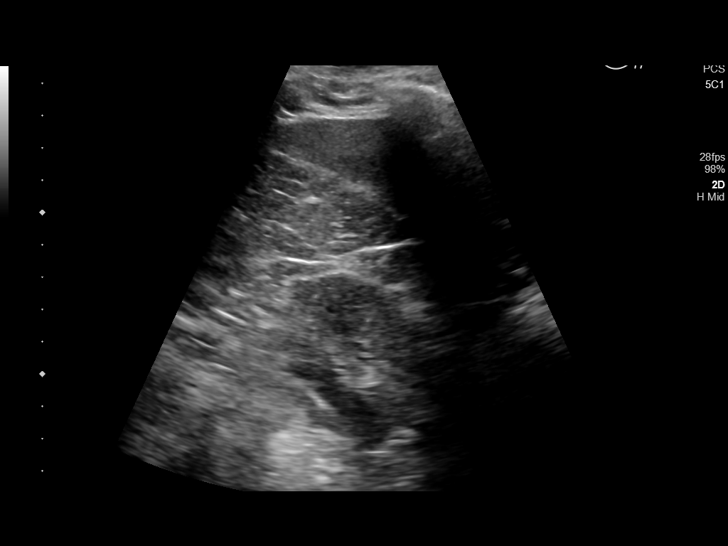
[im 40/80]
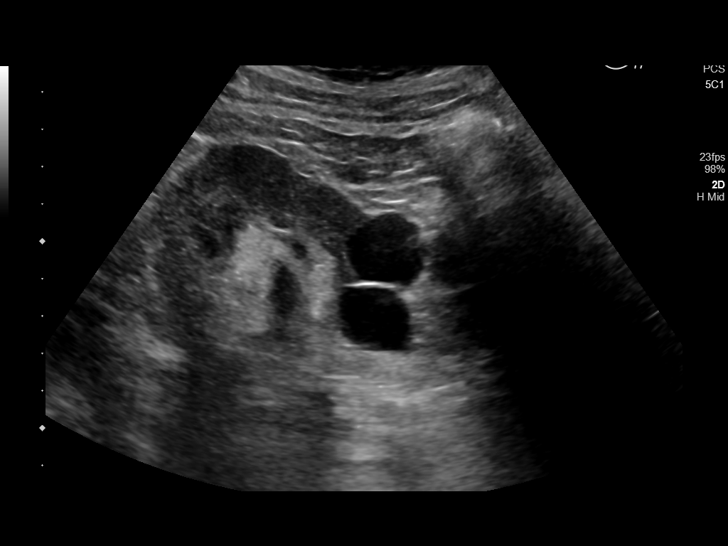
[im 47/80]
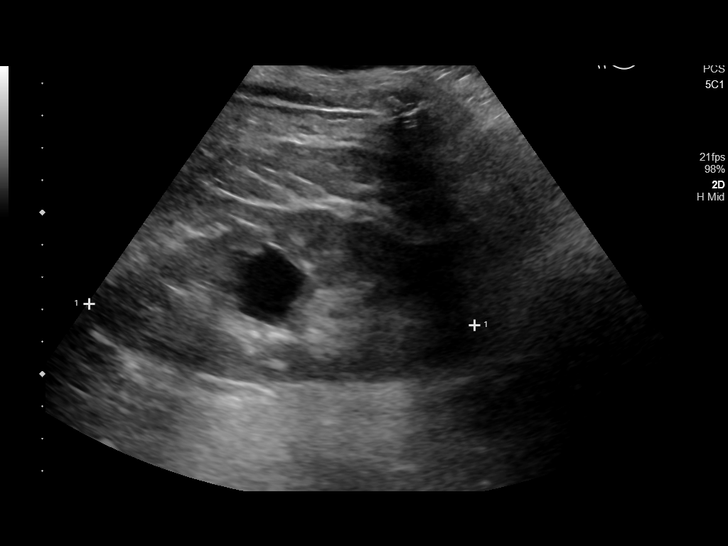
[im 53/80]
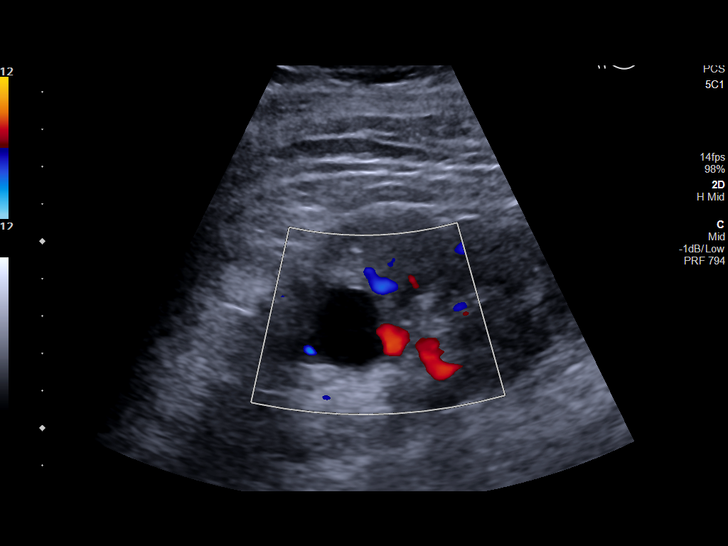
[im 60/80]
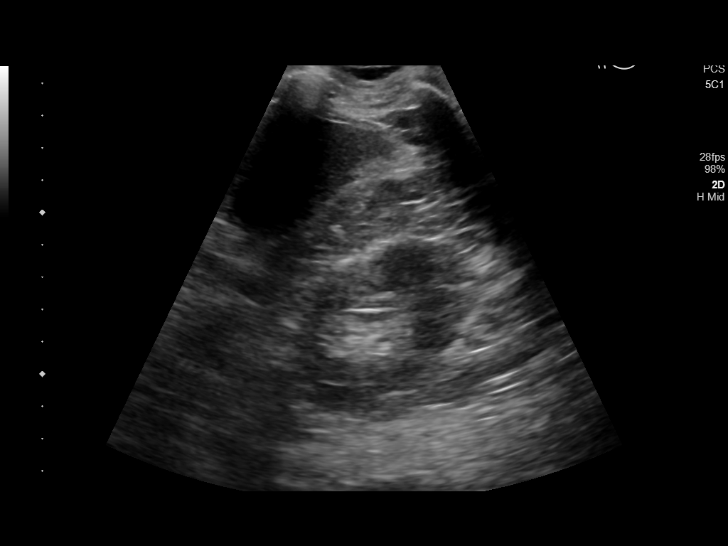
[im 66/80]
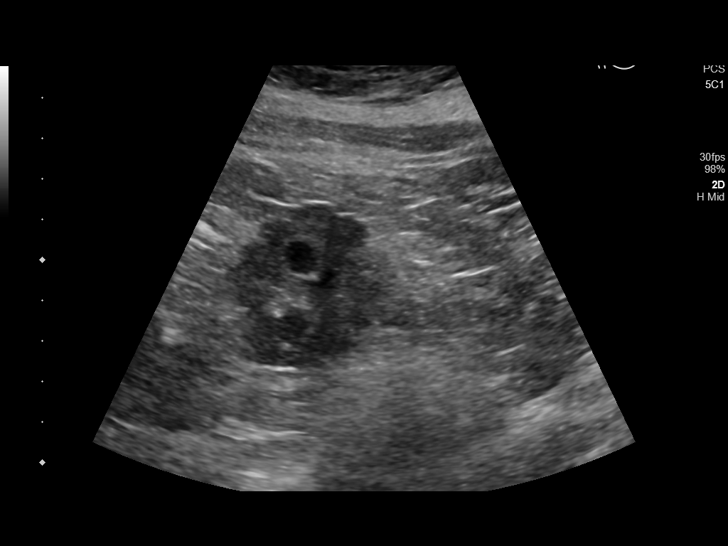
[im 73/80]
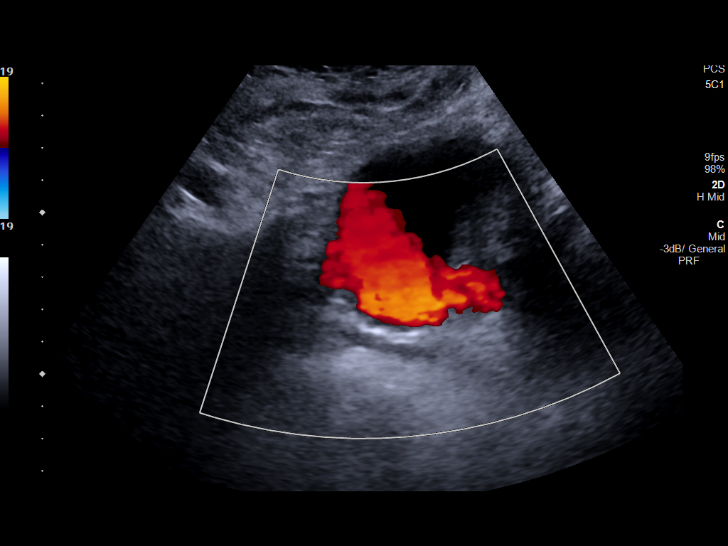
[im 80/80]
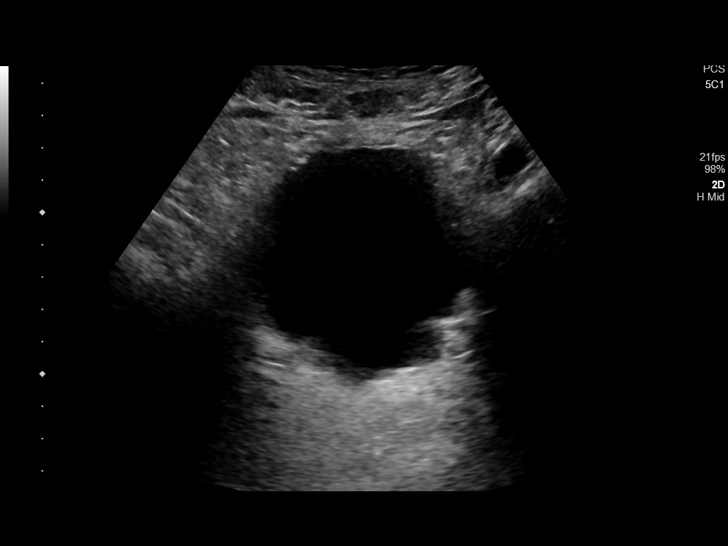

[13 of 25 positions shown; findings below may reference images not displayed]

FINDINGS: Right Kidney:

Length: 9.9 cm. Mild hydronephrosis. Comparing across modalities
with the prior MRI, the degree of hydronephrosis is slightly
improved. There is an echogenic focus in the lower pole collecting
system which measures 0.6 cm most consistent with a renal stone.
Multiple anechoic simple cysts are present arising from the
interpolar and lower pole regions. There is an additional mildly
complex cyst at the lower pole which measures 1.8 x 1.2 by 1.7 cm.
There is an internal echogenic component. On the prior MRI, this was
consistent with a mildly proteinaceous cyst. No definite solid
lesion is identified.

Left Kidney:

Length: 12.2 cm. No evidence of hydronephrosis. No solid mass.
Anechoic simple cyst in the interpolar kidney measures up to 2.6 cm.
A second smaller cyst in the lower pole measures only 1.1 cm.

Bladder:

Appears normal for degree of bladder distention. Both ureteral jets
are visualized.
IMPRESSION: 1. Persistent mild right-sided hydronephrosis. Comparing across
modalities to the prior MRI from 08/06/2017, the degree of
hydronephrosis appears slightly improved.
2. The right ureteral jet is visualized suggesting that there is no
obstruction of the right ureter.
3. Nonobstructing right lower pole nephrolithiasis measuring 6 mm.
4. Bilateral simple and mildly complex renal cysts.

## 2019-01-03 ENCOUNTER — Encounter: Payer: Self-pay | Admitting: Oncology

## 2019-01-03 ENCOUNTER — Telehealth: Payer: Self-pay

## 2019-01-03 ENCOUNTER — Other Ambulatory Visit: Payer: Self-pay

## 2019-01-03 NOTE — Telephone Encounter (Signed)
Copied from [place] [phone]. Topic: General - Other >> Jan 03, 2019  3:59 PM Rainey Pines A wrote: Patients caregiver stated that she would like a callback from Dr. Ancil Boozer nurse In regards to patient bringing in urine specimen for a possible UTI on Monday. Caregiver Peggy stated that this has been done before and would like a callback today. Best contact is [phone]

## 2019-01-03 NOTE — Progress Notes (Signed)
Called and left message on [employer]

## 2019-01-03 NOTE — Progress Notes (Signed)
Patient was not able to answer the questions. However, his caregiver-Ms. Dimas Millin was able to help. Patient had been having several urine infections and is needing another type of health services since patient is not able to do anything on his own, per caregiver.

## 2019-01-06 ENCOUNTER — Other Ambulatory Visit: Payer: Self-pay

## 2019-01-06 ENCOUNTER — Inpatient Hospital Stay (HOSPITAL_BASED_OUTPATIENT_CLINIC_OR_DEPARTMENT_OTHER): Payer: Medicare Other | Admitting: Oncology

## 2019-01-06 ENCOUNTER — Inpatient Hospital Stay: Payer: Medicare Other | Attending: Oncology

## 2019-01-06 VITALS — BP 124/91 | HR 61 | Temp 97.1°F | Wt 183.0 lb

## 2019-01-06 DIAGNOSIS — H5010 Unspecified exotropia: Secondary | ICD-10-CM | POA: Diagnosis not present

## 2019-01-06 DIAGNOSIS — F419 Anxiety disorder, unspecified: Secondary | ICD-10-CM | POA: Diagnosis not present

## 2019-01-06 DIAGNOSIS — G809 Cerebral palsy, unspecified: Secondary | ICD-10-CM | POA: Insufficient documentation

## 2019-01-06 DIAGNOSIS — R32 Unspecified urinary incontinence: Secondary | ICD-10-CM | POA: Diagnosis not present

## 2019-01-06 DIAGNOSIS — F79 Unspecified intellectual disabilities: Secondary | ICD-10-CM | POA: Diagnosis not present

## 2019-01-06 DIAGNOSIS — D72819 Decreased white blood cell count, unspecified: Secondary | ICD-10-CM | POA: Insufficient documentation

## 2019-01-06 DIAGNOSIS — Z923 Personal history of irradiation: Secondary | ICD-10-CM | POA: Diagnosis not present

## 2019-01-06 DIAGNOSIS — I872 Venous insufficiency (chronic) (peripheral): Secondary | ICD-10-CM | POA: Insufficient documentation

## 2019-01-06 DIAGNOSIS — Z87440 Personal history of urinary (tract) infections: Secondary | ICD-10-CM | POA: Insufficient documentation

## 2019-01-06 DIAGNOSIS — N400 Enlarged prostate without lower urinary tract symptoms: Secondary | ICD-10-CM | POA: Diagnosis not present

## 2019-01-06 DIAGNOSIS — Z8546 Personal history of malignant neoplasm of prostate: Secondary | ICD-10-CM | POA: Diagnosis not present

## 2019-01-06 DIAGNOSIS — E559 Vitamin D deficiency, unspecified: Secondary | ICD-10-CM | POA: Insufficient documentation

## 2019-01-06 DIAGNOSIS — Z7982 Long term (current) use of aspirin: Secondary | ICD-10-CM | POA: Insufficient documentation

## 2019-01-06 DIAGNOSIS — F329 Major depressive disorder, single episode, unspecified: Secondary | ICD-10-CM | POA: Diagnosis not present

## 2019-01-06 DIAGNOSIS — R310 Gross hematuria: Secondary | ICD-10-CM | POA: Insufficient documentation

## 2019-01-06 DIAGNOSIS — D696 Thrombocytopenia, unspecified: Secondary | ICD-10-CM | POA: Insufficient documentation

## 2019-01-06 DIAGNOSIS — R972 Elevated prostate specific antigen [PSA]: Secondary | ICD-10-CM | POA: Insufficient documentation

## 2019-01-06 DIAGNOSIS — L309 Dermatitis, unspecified: Secondary | ICD-10-CM | POA: Insufficient documentation

## 2019-01-06 DIAGNOSIS — I10 Essential (primary) hypertension: Secondary | ICD-10-CM | POA: Insufficient documentation

## 2019-01-06 DIAGNOSIS — D709 Neutropenia, unspecified: Secondary | ICD-10-CM | POA: Insufficient documentation

## 2019-01-06 DIAGNOSIS — E785 Hyperlipidemia, unspecified: Secondary | ICD-10-CM | POA: Diagnosis not present

## 2019-01-06 DIAGNOSIS — R600 Localized edema: Secondary | ICD-10-CM | POA: Diagnosis not present

## 2019-01-06 DIAGNOSIS — Z993 Dependence on wheelchair: Secondary | ICD-10-CM | POA: Insufficient documentation

## 2019-01-06 DIAGNOSIS — Z79899 Other long term (current) drug therapy: Secondary | ICD-10-CM | POA: Diagnosis not present

## 2019-01-06 LAB — CBC WITH DIFFERENTIAL/PLATELET
Abs Immature Granulocytes: 0.01 10*3/uL (ref 0.00–0.07)
Basophils Absolute: 0 10*3/uL (ref 0.0–0.1)
Basophils Relative: 1 %
Eosinophils Absolute: 0.1 10*3/uL (ref 0.0–0.5)
Eosinophils Relative: 2 %
HCT: 35.8 % — ABNORMAL LOW (ref 39.0–52.0)
Hemoglobin: 12.2 g/dL — ABNORMAL LOW (ref 13.0–17.0)
Immature Granulocytes: 0 %
Lymphocytes Relative: 34 %
Lymphs Abs: 1.2 10*3/uL (ref 0.7–4.0)
MCH: 32.6 pg (ref 26.0–34.0)
MCHC: 34.1 g/dL (ref 30.0–36.0)
MCV: 95.7 fL (ref 80.0–100.0)
Monocytes Absolute: 0.2 10*3/uL (ref 0.1–1.0)
Monocytes Relative: 5 %
Neutro Abs: 2 10*3/uL (ref 1.7–7.7)
Neutrophils Relative %: 58 %
Platelets: 136 10*3/uL — ABNORMAL LOW (ref 150–400)
RBC: 3.74 MIL/uL — ABNORMAL LOW (ref 4.22–5.81)
RDW: 12.9 % (ref 11.5–15.5)
WBC: 3.4 10*3/uL — ABNORMAL LOW (ref 4.0–10.5)
nRBC: 0 % (ref 0.0–0.2)

## 2019-01-06 NOTE — Telephone Encounter (Signed)
Informed patient caregiver Vickii Chafe that Mr. Eisenstadt sees a Airline pilot and they can call their office about dropping off a urine sample.

## 2019-01-07 NOTE — Progress Notes (Signed)
Hematology/Oncology Consult note [facility]  Telephone:([phone] [phone]  Patient Care Team: Steele Sizer, MD as PCP - General (Family Medicine) Steele Sizer, MD (Family Medicine) Alvy Bimler, MD as Consulting Physician (Psychiatry) Abbie Sons, MD (Urology) Lorelee Cover., MD (Ophthalmology) Delana Meyer, Dolores Lory, MD (Vascular Surgery)   Name of the patient: [name]  [id number]  14-Dec-1940   Date of visit: 01/07/19  Diagnosis-intermittent neutropenia likely benign  Chief complaint/ Reason for visit-routine follow-up of neutropenia  Heme/Onc history: Patient is a 78 year old male was been referred to [place] for leukopenia. Most recent CBC on 06/05/2018 showed a white count of 3.5, H&H of 12.5/35.9 and a platelet count of 131. Differential on the CBC was essentially normal. His past medical history significant for hypertension, cerebral palsy, history of depression. He lives in a nursing home and is unable to provide any history. He also sees urology for recurrent UTIs.  Results of blood work from 06/21/2018 were as follows: CBC showed white count of 5.5 with a normal differential and an ANC of 4.0.  H&H 12.8/37.1 and a platelet count of 158.  Hepatitis C testing was negative.  Hepatitis B negative also suggesting patient has not been vaccinated for it so far.  Iron studies were within normal limits.  Haptoglobin was normal.  TSH was normal.  Multiple myeloma panel showed no M protein.  Smear review showed adequate platelets and normal WBC and RBC morphology.  Interval history-patient is unable to give any history.  I spoke with his caregiver by me over the phone today.  She reports that overall he is doing well but continues to struggle with recurrent episodes of UTI.  ECOG PS- 3   Review of systems- Review of Systems  Unable to perform ROS: Other    Cerebral palsy No Known Allergies   Past Medical History:   Diagnosis Date  . Anxiety   . Atrophy, disuse, muscle   . BPH (benign prostatic hyperplasia)   . Cerebral palsy ([place])   . Eczema   . Elevated lipids   . Elevated PSA   . Exotropia   . Hematuria, gross   . Hyperlipidemia   . Incontinence   . Kidney stone   . Lower extremity edema    lymph drainage problems  . Lower urinary tract symptoms   . Lymphedema   . Mental retardation   . Over weight   . Prostate cancer ([place])   . Urinary frequency   . Urinary urgency   . UTI (urinary tract infection)   . Venous insufficiency   . Vitamin D deficiency   . Wheelchair dependence      Past Surgical History:  Procedure Laterality Date  . CYSTOSCOPY/URETEROSCOPY/HOLMIUM LASER/STENT PLACEMENT Right 08/14/2017   Procedure: CYSTOSCOPY/URETEROSCOPY/HOLMIUM LASER/STENT PLACEMENT;  Surgeon: Abbie Sons, MD;  Location: [facility] ORS;  Service: Urology;  Laterality: Right;  . leg circulation surgery Right   . PROSTATE SURGERY  11/10/2008   BRACHYTHERAPY    Social History   Socioeconomic History  . Marital status: Single    Spouse name: Not on file  . Number of children: Not on file  . Years of education: Not on file  . Highest education level: Not on file  Occupational History  . Not on file  Social Needs  . Financial resource strain: Not hard at all  . Food insecurity    Worry: Never true    Inability: Never true  . Transportation needs  Medical: No    Non-medical: No  Tobacco Use  . Smoking status: Never Smoker  . Smokeless tobacco: Never Used  Substance and Sexual Activity  . Alcohol use: No    Alcohol/week: 0.0 standard drinks  . Drug use: No  . Sexual activity: Not Currently  Lifestyle  . Physical activity    Days per week: 0 days    Minutes per session: 0 min  . Stress: Not at all  Relationships  . Social Herbalist on phone: Never    Gets together: Never    Attends religious service: Never    Active member of club or organization: No    Attends  meetings of clubs or organizations: Never    Relationship status: Never married  . Intimate partner violence    Fear of current or ex partner: Patient refused    Emotionally abused: Patient refused    Physically abused: Patient refused    Forced sexual activity: Patient refused  Other Topics Concern  . Not on file  Social History Narrative   Lives at [facility] group home, 5 males.         Family History  Problem Relation Age of Onset  . Heart attack Brother   . Heart disease Other      Current Outpatient Medications:  .  alfuzosin (UROXATRAL) 10 MG 24 hr tablet, TAKE 1 TABLET (10MG) BY MOUTH ONCE DAILY WITH BREAKFAST*DO NOT CRUSH*, Disp: 31 tablet, Rfl: 10 .  ALPRAZolam (XANAX) 0.25 MG tablet, TAKE ONE TABLET BY MOUTH 2 TIMES A DAY AS NEEDED FOR ANXIETY (WHEN BLOOD PRESSURE IS VERY HIGH OR ANXIOUS), Disp: 20 tablet, Rfl: 0 .  AMITIZA 24 MCG capsule, TAKE ONE CAPSULE BY MOUTH TWO TIMES A DAY WITH A MEAL, Disp: 62 capsule, Rfl: 10 .  ARTIFICIAL TEAR SOLUTION OP, Apply 2 drops to eye 2 (two) times daily as needed (dry eyes)., Disp: , Rfl:  .  aspirin (ASPIRIN ADULT LOW STRENGTH) 81 MG EC tablet, TAKE ONE TABLET BY MOUTH EVERY DAY *DO NOT CRUSH*, Disp: 31 tablet, Rfl: 10 .  atorvastatin (LIPITOR) 40 MG tablet, TAKE 1 TABLET BY MOUTH EACH EVENING, Disp: 28 tablet, Rfl: 10 .  betamethasone dipropionate (DIPROLENE) 0.05 % cream, Apply 1 application topically as needed (rash)., Disp: 45 g, Rfl: 0 .  Calcium Carb-Cholecalciferol 500-400 MG-UNIT TABS, TAKE ONE TABLET BY MOUTH 2 TIMES A DAY, Disp: 60 tablet, Rfl: 12 .  Cranberry Juice Powder 425 MG CAPS, TAKE ONE CAPSULE BY MOUTH EVERY DAY, Disp: 31 capsule, Rfl: 10 .  Elastic Bandages & Supports (MEDICAL COMPRESSION STOCKINGS) MISC, 1 Units by Does not apply route daily., Disp: 2 each, Rfl: 0 .  Incontinence Supply Disposable (DEPEND SILHOUETTE BRIEFS L/XL) MISC, 1 each by Does not apply route 2 (two) times daily as needed., Disp: 60 each,  Rfl: 5 .  Polyethyl Glycol-Propyl Glycol (SYSTANE ULTRA) 0.4-0.3 % SOLN, Apply 1 drop to eye 2 (two) times daily. Both eyes, Disp: , Rfl:  .  polyethylene glycol (MIRALAX / GLYCOLAX) 17 g packet, Take 17 g by mouth daily., Disp: , Rfl:  .  sertraline (ZOLOFT) 100 MG tablet, TAKE 1 TABLET BY MOUTH ONCE DAILY, Disp: 31 tablet, Rfl: 10 .  vitamin C (ASCORBIC ACID) 500 MG tablet, TAKE ONE TABLET BY MOUTH TWO TIMES A DAY, Disp: 56 tablet, Rfl: 10 .  chlorproMAZINE (THORAZINE) 25 MG tablet, Take 1 tablet (25 mg total) by mouth 3 (three) times daily for 5  days., Disp: 15 tablet, Rfl: 0  Physical exam:  Vitals:   01/06/19 0952  BP: (!) 124/91  Pulse: 61  Temp: (!) 97.1 F (36.2 C)  TempSrc: Tympanic  Weight: 183 lb (83 kg)   Physical Exam Constitutional:      Comments: Sitting in a wheelchair.  Appears in no acute distress  HENT:     Head: Normocephalic and atraumatic.  Eyes:     Pupils: Pupils are equal, round, and reactive to light.  Neck:     Musculoskeletal: Normal range of motion.  Cardiovascular:     Rate and Rhythm: Normal rate and regular rhythm.     Heart sounds: Normal heart sounds.  Pulmonary:     Effort: Pulmonary effort is normal.     Breath sounds: Normal breath sounds.  Abdominal:     General: Bowel sounds are normal.     Palpations: Abdomen is soft.  Skin:    General: Skin is warm and dry.  Neurological:     Mental Status: He is alert.      CMP Latest Ref Rng & Units 05/24/2018  Glucose 65 - 99 mg/dL 83  BUN 7 - 25 mg/dL 17  Creatinine 0.70 - 1.18 mg/dL 0.77  Sodium 135 - 146 mmol/L 137  Potassium 3.5 - 5.3 mmol/L 4.4  Chloride 98 - 110 mmol/L 104  CO2 20 - 32 mmol/L 26  Calcium 8.6 - 10.3 mg/dL 9.2  Total Protein 6.1 - 8.1 g/dL 6.4  Total Bilirubin 0.2 - 1.2 mg/dL 0.4  Alkaline Phos 40 - 115 U/L -  AST 10 - 35 U/L 21  ALT 9 - 46 U/L 19   CBC Latest Ref Rng & Units 01/06/2019  WBC 4.0 - 10.5 K/uL 3.4(L)  Hemoglobin 13.0 - 17.0 g/dL 12.2(L)  Hematocrit  39.0 - 52.0 % 35.8(L)  Platelets 150 - 400 K/uL 136(L)     Assessment and plan- Patient is a 78 y.o. male referred for leukopenia/neutropenia  Patient has intermittent leukopenia with mild neutropenia as well as mild intermittent thrombocytopenia.  This may be age related MDS.  His ANC today is 2 mornings hemoglobin is stable around 12.  This can be monitored conservatively and he does not require a bone marrow biopsy at this time.  I will see him back in 6 months with a CBC with differential   Visit Diagnosis 1. Leukopenia, unspecified type      Dr. Randa Evens, MD, MPH [facility] [phone] 01/07/2019 8:23 AM

## 2019-01-14 ENCOUNTER — Other Ambulatory Visit: Payer: Self-pay | Admitting: Family Medicine

## 2019-01-14 ENCOUNTER — Telehealth: Payer: Self-pay

## 2019-01-14 NOTE — Telephone Encounter (Signed)
Received from the pharmacy a refill request for Omega-3 Ethyl Esters 1 gm cap. Taking 1 capsule by mouth two times daily. However did not see this on his active medication list.

## 2019-02-10 ENCOUNTER — Ambulatory Visit: Payer: Medicare Other | Admitting: Urology

## 2019-02-10 ENCOUNTER — Encounter: Payer: Self-pay | Admitting: Urology

## 2019-02-21 ENCOUNTER — Encounter: Payer: Medicare Other | Admitting: Family Medicine

## 2019-02-21 ENCOUNTER — Other Ambulatory Visit: Payer: Self-pay | Admitting: Family Medicine

## 2019-02-21 NOTE — Telephone Encounter (Signed)
Medication Refill - Medication: aspirin (ASPIRIN ADULT LOW STRENGTH) 81 MG EC tablet    Has the patient contacted their pharmacy? Yes.   (Agent: If no, request that the patient contact the pharmacy for the refill.) (Agent: If yes, when and what did the pharmacy advise?)  Preferred Pharmacy (with phone number or street name):  [place], [place] - [place]  [address] [place] [address]  Phone: [phone] Fax: [phone]     Agent: Please be advised that RX refills may take up to 3 business days. We ask that you follow-up with your pharmacy.

## 2019-02-25 ENCOUNTER — Telehealth: Payer: Self-pay

## 2019-02-25 NOTE — Telephone Encounter (Signed)
Spoke with [name] and she said that was fine that she will just bring the urine when he comes in for his appt on 11.5.2020

## 2019-02-25 NOTE — Telephone Encounter (Signed)
Copied from [place] [id number]. Topic: General - Inquiry >> Feb 25, 2019  8:48 AM Berneta Levins wrote: Reason for CRM:   [name] from [facility] calling for pt.  Staff believes he has a UTI and have taken a urine specemin.  They want to know if they can bring to the office to be tested.

## 2019-02-27 ENCOUNTER — Ambulatory Visit (INDEPENDENT_AMBULATORY_CARE_PROVIDER_SITE_OTHER): Payer: Medicare Other | Admitting: Family Medicine

## 2019-02-27 ENCOUNTER — Other Ambulatory Visit: Payer: Self-pay

## 2019-02-27 ENCOUNTER — Encounter: Payer: Self-pay | Admitting: Family Medicine

## 2019-02-27 ENCOUNTER — Telehealth: Payer: Self-pay | Admitting: Family Medicine

## 2019-02-27 VITALS — BP 110/68 | HR 86 | Temp 97.7°F | Resp 16

## 2019-02-27 DIAGNOSIS — K5909 Other constipation: Secondary | ICD-10-CM

## 2019-02-27 DIAGNOSIS — I2584 Coronary atherosclerosis due to calcified coronary lesion: Secondary | ICD-10-CM

## 2019-02-27 DIAGNOSIS — F320 Major depressive disorder, single episode, mild: Secondary | ICD-10-CM

## 2019-02-27 DIAGNOSIS — I700 Atherosclerosis of aorta: Secondary | ICD-10-CM | POA: Diagnosis not present

## 2019-02-27 DIAGNOSIS — D709 Neutropenia, unspecified: Secondary | ICD-10-CM

## 2019-02-27 DIAGNOSIS — R8290 Unspecified abnormal findings in urine: Secondary | ICD-10-CM | POA: Diagnosis not present

## 2019-02-27 DIAGNOSIS — R739 Hyperglycemia, unspecified: Secondary | ICD-10-CM | POA: Diagnosis not present

## 2019-02-27 DIAGNOSIS — I359 Nonrheumatic aortic valve disorder, unspecified: Secondary | ICD-10-CM

## 2019-02-27 DIAGNOSIS — G809 Cerebral palsy, unspecified: Secondary | ICD-10-CM

## 2019-02-27 DIAGNOSIS — Z79899 Other long term (current) drug therapy: Secondary | ICD-10-CM | POA: Diagnosis not present

## 2019-02-27 DIAGNOSIS — I2510 Atherosclerotic heart disease of native coronary artery without angina pectoris: Secondary | ICD-10-CM

## 2019-02-27 DIAGNOSIS — R2681 Unsteadiness on feet: Secondary | ICD-10-CM

## 2019-02-27 DIAGNOSIS — Z993 Dependence on wheelchair: Secondary | ICD-10-CM

## 2019-02-27 DIAGNOSIS — D696 Thrombocytopenia, unspecified: Secondary | ICD-10-CM | POA: Diagnosis not present

## 2019-02-27 DIAGNOSIS — N39498 Other specified urinary incontinence: Secondary | ICD-10-CM

## 2019-02-27 MED ORDER — ATORVASTATIN CALCIUM 40 MG PO TABS: 40.0000 mg | ORAL_TABLET | Freq: Every day | ORAL | 5 refills | Status: DC

## 2019-02-27 NOTE — Progress Notes (Signed)
Name: [name]   MRN: [id number]    DOB: February 02, 1941   Date:02/27/2019       Progress Note  Subjective  Chief Complaint  Chief Complaint  Patient presents with  . Form Completion    FL-2    HPI  Guardian is his nephew: [name] # [phone]  Today he is here with : [name] , [name] -he lives at [facility] group home  Urine odor: [name] has a history of kidney stones and recurrent UTI's , he lives in a group home and caregiver [name] ) states his urine has an odor and looks darker than usual, no fever or chills. Appetite has been normal.   He has MR, poor historian. He has CP, he has been getting progressively weaker on his legs and is requiring more assistance, he will be moving on Nov 12 th, to [facility] for higher level care through [place] leg edema/PA: using pumps at night and compression stocking hoses during the day and has been doing well. Swelling is significantly less , mostly on ankles   Atherosclerosis aorta, and coronary arteries with also calcified aortic valve, he does not seem to have chest pain, but he is on medical management , taking aspirin and atorvastatin   Depression: he is taking Zoloft daily, cooperative, caregiver states he has been a little more quiet since he was informed that he will be moving to a different facility, no crying spells. He asked to continue coming here to see me. Normal appetite   History of kidney stones: seeing Dr. Bernardo Heater and had a cystoscopy, right ureteroscopy and stone removal and stent placement, urinary incontinence has improved, has to change depends at most twice day now   Prostate cancer: had brachytherapy in 2010 , was seeing Dr. Jacqlyn Larsen, currently under the care of Dr. Bernardo Heater. Unchanged   Leucopenia and thrombocytopenia: seeing Dr. Janese Banks and getting monitored, last platelets 136, no easy bruising    Patient Active Problem List   Diagnosis Date Noted  . Renal mass 07/23/2017   . Ureteral calculus 07/23/2017  . PAD (peripheral artery disease) ([place]) 07/04/2017  . Aortic valve calcification 07/04/2017  . Coronary artery calcification 07/04/2017  . Hypertension 10/26/2016  . Late effect acute polio 05/17/2016  . Chronic constipation 11/19/2015  . Mild major depression ([place]) 11/19/2015  . Complicated grieving 0000000  . Recurrent UTI 09/11/2015  . Adenocarcinoma of prostate ([place]) 03/02/2015  . Amyotrophia 03/02/2015  . Edema leg 03/02/2015  . Cerebral palsy ([place]) 03/02/2015  . Dyslipidemia 03/02/2015  . Dermatitis, eczematoid 03/02/2015  . 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 H/O acute poliomyelitis 03/02/2015  . Lymphedema 03/02/2015  . Mental retardation 03/02/2015  . Hypertrophy of nail 03/02/2015  . Adiposity 03/02/2015  . Absence of bladder continence 03/02/2015  . Chronic venous insufficiency 03/02/2015  . Avitaminosis D 03/02/2015  . Adynamia 03/02/2015  . Dependent on wheelchair 03/02/2015  . Kidney cysts 03/02/2015  . At risk for falling 03/02/2015  . Cerebral vascular disease 03/02/2015  . Strabismus 02/11/2015  . Urge incontinence of urine 02/11/2015  . Wheelchair dependence 02/11/2015  . Obesity 02/11/2015  . Urinary frequency 10/30/2014    Past Surgical History:  Procedure Laterality Date  . CYSTOSCOPY/URETEROSCOPY/HOLMIUM LASER/STENT PLACEMENT Right 08/14/2017   Procedure: CYSTOSCOPY/URETEROSCOPY/HOLMIUM LASER/STENT PLACEMENT;  Surgeon: Abbie Sons, MD;  Location: [facility] ORS;  Service: Urology;  Laterality: Right;  . leg circulation surgery Right   . PROSTATE SURGERY  11/10/2008  BRACHYTHERAPY    Family History  Problem Relation Age of Onset  . Heart attack Brother   . Heart disease Other     Social History   Socioeconomic History  . Marital status: Single    Spouse name: Not on file  . Number of children: Not on file  . Years of education: Not on file  . Highest education level: Not on file  Occupational History  .  Not on file  Social Needs  . Financial resource strain: Not hard at all  . Food insecurity    Worry: Never true    Inability: Never true  . Transportation needs    Medical: No    Non-medical: No  Tobacco Use  . Smoking status: Never Smoker  . Smokeless tobacco: Never Used  Substance and Sexual Activity  . Alcohol use: No    Alcohol/week: 0.0 standard drinks  . Drug use: No  . Sexual activity: Not Currently  Lifestyle  . Physical activity    Days per week: 0 days    Minutes per session: 0 min  . Stress: Not at all  Relationships  . Social Herbalist on phone: Never    Gets together: Never    Attends religious service: Never    Active member of club or organization: No    Attends meetings of clubs or organizations: Never    Relationship status: Never married  . Intimate partner violence    Fear of current or ex partner: Patient refused    Emotionally abused: Patient refused    Physically abused: Patient refused    Forced sexual activity: Patient refused  Other Topics Concern  . Not on file  Social History Narrative   Lives at [facility] group home, 5 males.          Current Outpatient Medications:  .  alfuzosin (UROXATRAL) 10 MG 24 hr tablet, TAKE 1 TABLET (10MG ) BY MOUTH ONCE DAILY WITH BREAKFAST*DO NOT CRUSH*, Disp: 31 tablet, Rfl: 10 .  ALPRAZolam (XANAX) 0.25 MG tablet, TAKE ONE TABLET BY MOUTH 2 TIMES A DAY AS NEEDED FOR ANXIETY (WHEN BLOOD PRESSURE IS VERY HIGH OR ANXIOUS), Disp: 20 tablet, Rfl: 0 .  AMITIZA 24 MCG capsule, TAKE ONE CAPSULE BY MOUTH TWO TIMES A DAY WITH A MEAL, Disp: 62 capsule, Rfl: 10 .  ARTIFICIAL TEAR SOLUTION OP, Apply 2 drops to eye 2 (two) times daily as needed (dry eyes)., Disp: , Rfl:  .  aspirin (ASPIRIN ADULT LOW STRENGTH) 81 MG EC tablet, TAKE ONE TABLET BY MOUTH EVERY DAY *DO NOT CRUSH*, Disp: 31 tablet, Rfl: 10 .  atorvastatin (LIPITOR) 40 MG tablet, Take 1 tablet (40 mg total) by mouth daily., Disp: 28 tablet, Rfl: 5 .   betamethasone dipropionate (DIPROLENE) 0.05 % cream, Apply 1 application topically as needed (rash)., Disp: 45 g, Rfl: 0 .  Calcium Carb-Cholecalciferol 500-400 MG-UNIT TABS, TAKE ONE TABLET BY MOUTH 2 TIMES A DAY, Disp: 60 tablet, Rfl: 12 .  Cranberry Juice Powder 425 MG CAPS, TAKE ONE CAPSULE BY MOUTH EVERY DAY, Disp: 31 capsule, Rfl: 10 .  Elastic Bandages & Supports (MEDICAL COMPRESSION STOCKINGS) MISC, 1 Units by Does not apply route daily., Disp: 2 each, Rfl: 0 .  Incontinence Supply Disposable (DEPEND SILHOUETTE BRIEFS L/XL) MISC, 1 each by Does not apply route 2 (two) times daily as needed., Disp: 60 each, Rfl: 5 .  Polyethyl Glycol-Propyl Glycol (SYSTANE ULTRA) 0.4-0.3 % SOLN, Apply 1 drop to eye 2 (  two) times daily. Both eyes, Disp: , Rfl:  .  polyethylene glycol (MIRALAX / GLYCOLAX) 17 g packet, Take 17 g by mouth daily., Disp: , Rfl:  .  sertraline (ZOLOFT) 100 MG tablet, TAKE 1 TABLET BY MOUTH ONCE DAILY, Disp: 31 tablet, Rfl: 10 .  vitamin C (ASCORBIC ACID) 500 MG tablet, TAKE ONE TABLET BY MOUTH TWO TIMES A DAY, Disp: 56 tablet, Rfl: 10  No Known Allergies  I personally reviewed active problem list, medication list, allergies, family history, social history with the patient/caregiver today.   ROS  Constitutional: Negative for fever or weight change.  Respiratory: Negative for cough and shortness of breath.   Cardiovascular: Negative for chest pain or palpitations.  Gastrointestinal: Negative for abdominal pain, no bowel changes.  Musculoskeletal: Positive for gait problem but no joint swelling.  Skin: Negative for rash.  Neurological: Negative for dizziness or headache.  No other specific complaints in a complete review of systems (except as listed in HPI above).  Objective  Vitals:   02/27/19 0851  BP: 110/68  Pulse: 86  Resp: 16  Temp: 97.7 F (36.5 C)  TempSrc: Temporal  SpO2: 98%    There is no height or weight on file to calculate BMI.  Physical  Exam  Constitutional: Patient appears thinner than usual, but weight not checked today .   No distress.  HEENT: head atraumatic, normocephalic, strabismus  Cardiovascular: Normal rate, regular rhythm and normal heart sounds.  No murmur heard. No edema on ankles  Pulmonary/Chest: Effort normal and breath sounds normal. No respiratory distress. Abdominal: Soft.  There is no tenderness. Psychiatric: Patient is quieter than usual, but still engaging and cooperative   Recent Results (from the past 2160 hour(s))  CBC with Differential/Platelet     Status: Abnormal   Collection Time: 01/06/19  9:36 AM  Result Value Ref Range   WBC 3.4 (L) 4.0 - 10.5 K/uL   RBC 3.74 (L) 4.22 - 5.81 MIL/uL   Hemoglobin 12.2 (L) 13.0 - 17.0 g/dL   HCT 35.8 (L) 39.0 - 52.0 %   MCV 95.7 80.0 - 100.0 fL   MCH 32.6 26.0 - 34.0 pg   MCHC 34.1 30.0 - 36.0 g/dL   RDW 12.9 11.5 - 15.5 %   Platelets 136 (L) 150 - 400 K/uL   nRBC 0.0 0.0 - 0.2 %   Neutrophils Relative % 58 %   Neutro Abs 2.0 1.7 - 7.7 K/uL   Lymphocytes Relative 34 %   Lymphs Abs 1.2 0.7 - 4.0 K/uL   Monocytes Relative 5 %   Monocytes Absolute 0.2 0.1 - 1.0 K/uL   Eosinophils Relative 2 %   Eosinophils Absolute 0.1 0.0 - 0.5 K/uL   Basophils Relative 1 %   Basophils Absolute 0.0 0.0 - 0.1 K/uL   Immature Granulocytes 0 %   Abs Immature Granulocytes 0.01 0.00 - 0.07 K/uL    Comment: Performed at [facility], [address] [place], [place] [address]      PHQ2/9: Depression screen [facility] 2/9 02/27/2019 10/03/2018 08/20/2018 05/24/2018 05/17/2018  Decreased Interest 0 0 0 0 0  Down, Depressed, Hopeless 0 0 0 0 0  PHQ - 2 Score 0 0 0 0 0  Altered sleeping 0 0 0 0 0  Tired, decreased energy 0 0 0 0 0  Change in appetite 0 0 0 0 0  Feeling bad or failure about yourself  0 0 0 0 0  Trouble concentrating 0 0 0 0  0  Moving slowly or fidgety/restless 0 0 0 0 0  Suicidal thoughts 0 0 0 0 0  PHQ-9 Score 0 0 0 0 0  Difficult doing work/chores -  - Not difficult at all Not difficult at all Not difficult at all  Some recent data might be hidden    phq 9 is negative - answered by staff member, patient unable to answer   Fall Risk: Fall Risk  02/27/2019 10/03/2018 08/20/2018 05/24/2018 05/17/2018  Falls in the past year? 0 0 0 0 0  Number falls in past yr: 0 0 0 0 0  Injury with Fall? 0 0 0 0 0  Risk Factor Category  - - - - -  Risk for fall due to : - - - - -  Follow up - - Falls evaluation completed - -     Functional Status Survey: Is the patient deaf or have difficulty hearing?: No Does the patient have difficulty seeing, even when wearing glasses/contacts?: No Does the patient have difficulty concentrating, remembering, or making decisions?: Yes Does the patient have difficulty walking or climbing stairs?: Yes Does the patient have difficulty dressing or bathing?: Yes Does the patient have difficulty doing errands alone such as visiting a doctor's office or shopping?: Yes    Assessment & Plan  1. Neutropenia, unspecified type ([place])  Keep follow up with Dr. Janese Banks  2. Chronic constipation  Under control at this time  3. Atherosclerosis of aorta (H[facility])  - Lipid panel - atorvastatin (LIPITOR) 40 MG tablet; Take 1 tablet (40 mg total) by mouth daily.  Dispense: 28 tablet; Refill: 5  4. Hyperglycemia  Last A1C normal   5. Abnormal urine odor  - CULTURE, URINE COMPREHENSIVE  6. Thrombocytopenia (H[facility])  - CBC with Differential/Platelet  7. Cerebral palsy, unspecified type ([place])   8. Gait instability  From CP   9. Other urinary incontinence  Wearing Depends   10. Wheelchair bound   11. Long-term use of high-risk medication  - COMPLETE METABOLIC PANEL WITH GFR  12. Aortic valve calcification  - atorvastatin (LIPITOR) 40 MG tablet; Take 1 tablet (40 mg total) by mouth daily.  Dispense: 28 tablet; Refill: 5  13. Coronary artery calcification  - atorvastatin (LIPITOR) 40 MG tablet; Take 1 tablet (40  mg total) by mouth daily.  Dispense: 28 tablet; Refill: 5  14. Mild major depression ([place])  Keep follow up with Dr. Tamera Punt

## 2019-02-27 NOTE — Chronic Care Management (AMB) (Signed)
Chronic Care Management  ° °Note ° °02/27/2019 °Name: [name] MRN: [id number] DOB: 08/06/1940 ° °[name] is a 78 y.o. year old male who is a primary care patient of Sowles, Krichna, MD. I reached out to [name] by phone today in response to a referral sent by [name]'s health plan.    ° °Mr. Twist was given information about Chronic Care Management services today including:  °1. CCM service includes personalized support from designated clinical staff supervised by his physician, including individualized plan of care and coordination with other care providers °2. 24/7 contact phone numbers for assistance for urgent and routine care needs. °3. Service will only be billed when office clinical staff spend 20 minutes or more in a month to coordinate care. °4. Only one practitioner may furnish and bill the service in a calendar month. °5. The patient may stop CCM services at any time (effective at the end of the month) by phone call to the office staff. °6. The patient will be responsible for cost sharing (co-pay) of up to 20% of the service fee (after annual deductible is met). °[name] did not agree to enrollment in care management services and does not wish to consider at this time. Because patient is in a group home. ° °Follow up plan: °The patient has been provided with contact information for the chronic care management team and has been advised to call with any health related questions or concerns.  ° °Bernice Cicero °Care Guide • [facility] °[place]   Connected Care  °??[email or web address]   ??[phone]   ° ° ° °

## 2019-02-28 ENCOUNTER — Telehealth: Payer: Self-pay

## 2019-02-28 DIAGNOSIS — I700 Atherosclerosis of aorta: Secondary | ICD-10-CM | POA: Diagnosis not present

## 2019-02-28 DIAGNOSIS — Z79899 Other long term (current) drug therapy: Secondary | ICD-10-CM | POA: Diagnosis not present

## 2019-02-28 DIAGNOSIS — G809 Cerebral palsy, unspecified: Secondary | ICD-10-CM

## 2019-02-28 DIAGNOSIS — R2681 Unsteadiness on feet: Secondary | ICD-10-CM

## 2019-02-28 DIAGNOSIS — D696 Thrombocytopenia, unspecified: Secondary | ICD-10-CM | POA: Diagnosis not present

## 2019-02-28 DIAGNOSIS — R8290 Unspecified abnormal findings in urine: Secondary | ICD-10-CM | POA: Diagnosis not present

## 2019-02-28 LAB — CBC WITH DIFFERENTIAL/PLATELET
Absolute Monocytes: 192 cells/uL — ABNORMAL LOW (ref 200–950)
Basophils Absolute: 10 cells/uL (ref 0–200)
Basophils Relative: 0.4 %
Eosinophils Absolute: 42 cells/uL (ref 15–500)
Eosinophils Relative: 1.6 %
HCT: 38 % — ABNORMAL LOW (ref 38.5–50.0)
Hemoglobin: 13 g/dL — ABNORMAL LOW (ref 13.2–17.1)
Lymphs Abs: 372 cells/uL — ABNORMAL LOW (ref 850–3900)
MCH: 32.7 pg (ref 27.0–33.0)
MCHC: 34.2 g/dL (ref 32.0–36.0)
MCV: 95.7 fL (ref 80.0–100.0)
MPV: 10.8 fL (ref 7.5–12.5)
Monocytes Relative: 7.4 %
Neutro Abs: 1984 cells/uL (ref 1500–7800)
Neutrophils Relative %: 76.3 %
Platelets: 134 10*3/uL — ABNORMAL LOW (ref 140–400)
RBC: 3.97 10*6/uL — ABNORMAL LOW (ref 4.20–5.80)
RDW: 12.2 % (ref 11.0–15.0)
Total Lymphocyte: 14.3 %
WBC: 2.6 10*3/uL — ABNORMAL LOW (ref 3.8–10.8)

## 2019-02-28 LAB — COMPLETE METABOLIC PANEL WITH GFR
AG Ratio: 1.4 (calc) (ref 1.0–2.5)
ALT: 10 U/L (ref 9–46)
AST: 14 U/L (ref 10–35)
Albumin: 3.7 g/dL (ref 3.6–5.1)
Alkaline phosphatase (APISO): 109 U/L (ref 35–144)
BUN: 15 mg/dL (ref 7–25)
CO2: 29 mmol/L (ref 20–32)
Calcium: 9.1 mg/dL (ref 8.6–10.3)
Chloride: 106 mmol/L (ref 98–110)
Creat: 0.71 mg/dL (ref 0.70–1.18)
GFR, Est African American: 106 mL/min/{1.73_m2} (ref >=60)
GFR, Est Non African American: 91 mL/min/{1.73_m2} (ref >=60)
Globulin: 2.7 g/dL (calc) (ref 1.9–3.7)
Glucose, Bld: 85 mg/dL (ref 65–99)
Potassium: 4 mmol/L (ref 3.5–5.3)
Sodium: 141 mmol/L (ref 135–146)
Total Bilirubin: 0.7 mg/dL (ref 0.2–1.2)
Total Protein: 6.4 g/dL (ref 6.1–8.1)

## 2019-02-28 LAB — LIPID PANEL
Cholesterol: 72 mg/dL (ref <200)
HDL: 39 mg/dL — ABNORMAL LOW (ref >=40)
LDL Cholesterol (Calc): 19 mg/dL (calc)
Non-HDL Cholesterol (Calc): 33 mg/dL (calc) (ref <130)
Total CHOL/HDL Ratio: 1.8 (calc) (ref <5.0)
Triglycerides: 49 mg/dL (ref <150)

## 2019-02-28 NOTE — Telephone Encounter (Signed)
Copied from [place] [id number]. Topic: General - Other >> Feb 27, 2019  2:05 PM Berneta Levins wrote: Reason for CRM:   [name] states that pt needs an order for a hospital bed because he has a [employer] fast streamer. [facility] can pick up the order once done.

## 2019-03-03 NOTE — Addendum Note (Signed)
Addended by: Chilton Greathouse on: 03/03/2019 03:15 PM   Modules accepted: Orders

## 2019-03-03 NOTE — Addendum Note (Signed)
Addended by: Steele Sizer F on: 03/03/2019 04:56 PM   Modules accepted: Orders

## 2019-03-04 ENCOUNTER — Encounter: Payer: Self-pay | Admitting: Family Medicine

## 2019-03-04 ENCOUNTER — Other Ambulatory Visit: Payer: Self-pay | Admitting: Family Medicine

## 2019-03-04 DIAGNOSIS — I2510 Atherosclerotic heart disease of native coronary artery without angina pectoris: Secondary | ICD-10-CM

## 2019-03-04 DIAGNOSIS — I359 Nonrheumatic aortic valve disorder, unspecified: Secondary | ICD-10-CM

## 2019-03-04 DIAGNOSIS — D61818 Other pancytopenia: Secondary | ICD-10-CM | POA: Insufficient documentation

## 2019-03-04 DIAGNOSIS — I700 Atherosclerosis of aorta: Secondary | ICD-10-CM

## 2019-03-04 LAB — CULTURE, URINE COMPREHENSIVE
MICRO NUMBER:: 1075806
SPECIMEN QUALITY:: ADEQUATE

## 2019-03-04 MED ORDER — ATORVASTATIN CALCIUM 20 MG PO TABS: 20.0000 mg | ORAL_TABLET | Freq: Every day | ORAL | 1 refills | Status: DC

## 2019-03-05 ENCOUNTER — Telehealth: Payer: Self-pay | Admitting: Family Medicine

## 2019-03-05 ENCOUNTER — Other Ambulatory Visit: Payer: Self-pay | Admitting: Family Medicine

## 2019-03-05 MED ORDER — NITROFURANTOIN MONOHYD MACRO 100 MG PO CAPS: 100.0000 mg | ORAL_CAPSULE | Freq: Two times a day (BID) | ORAL | 0 refills | Status: DC

## 2019-03-05 NOTE — Addendum Note (Signed)
Addended by: Steele Sizer F on: 03/05/2019 04:51 PM   Modules accepted: Orders

## 2019-03-05 NOTE — Telephone Encounter (Signed)
Pt's director called in to follow up on antibiotic being sent?  Please advise.    CBEW:[id number]- Director - Elouise Munroe

## 2019-03-05 NOTE — Telephone Encounter (Signed)
[phone] [facility] called requesting office notes required for hospital bed

## 2019-03-05 NOTE — Telephone Encounter (Signed)
Spoke with [facility] group and they confirm no antibiotic was sent in. Please send for the patient for his UTI per his lab results.

## 2019-03-05 NOTE — Telephone Encounter (Signed)
[facility] best contact: [phone]  Fax: [phone]

## 2019-03-05 NOTE — Telephone Encounter (Signed)
[name] calling from [employer] stated that pharmacy has not received antibiotic and would like this sent in today please.

## 2019-03-09 ENCOUNTER — Emergency Department
Admission: EM | Admit: 2019-03-09 | Discharge: 2019-03-10 | Disposition: A | Discharge status: Other Acute Inpatient Hospital | Payer: Medicare Other | Attending: Emergency Medicine | Admitting: Emergency Medicine

## 2019-03-09 ENCOUNTER — Emergency Department: Payer: Medicare Other

## 2019-03-09 ENCOUNTER — Other Ambulatory Visit: Payer: Self-pay

## 2019-03-09 ENCOUNTER — Encounter: Payer: Self-pay | Admitting: Emergency Medicine

## 2019-03-09 DIAGNOSIS — Z8546 Personal history of malignant neoplasm of prostate: Secondary | ICD-10-CM | POA: Insufficient documentation

## 2019-03-09 DIAGNOSIS — R0902 Hypoxemia: Secondary | ICD-10-CM | POA: Diagnosis not present

## 2019-03-09 DIAGNOSIS — R61 Generalized hyperhidrosis: Secondary | ICD-10-CM | POA: Diagnosis not present

## 2019-03-09 DIAGNOSIS — Z7982 Long term (current) use of aspirin: Secondary | ICD-10-CM | POA: Diagnosis not present

## 2019-03-09 DIAGNOSIS — U071 COVID-19: Secondary | ICD-10-CM | POA: Diagnosis not present

## 2019-03-09 DIAGNOSIS — Z743 Need for continuous supervision: Secondary | ICD-10-CM | POA: Diagnosis not present

## 2019-03-09 DIAGNOSIS — R509 Fever, unspecified: Secondary | ICD-10-CM | POA: Diagnosis not present

## 2019-03-09 DIAGNOSIS — R0602 Shortness of breath: Secondary | ICD-10-CM | POA: Diagnosis not present

## 2019-03-09 DIAGNOSIS — Z79899 Other long term (current) drug therapy: Secondary | ICD-10-CM | POA: Insufficient documentation

## 2019-03-09 LAB — LACTIC ACID, PLASMA
Lactic Acid, Venous: 1.8 mmol/L (ref 0.5–1.9)
Lactic Acid, Venous: 0.9 mmol/L (ref 0.5–1.9)

## 2019-03-09 LAB — CBC WITH DIFFERENTIAL/PLATELET
Abs Immature Granulocytes: 0.11 10*3/uL — ABNORMAL HIGH (ref 0.00–0.07)
Basophils Absolute: 0 10*3/uL (ref 0.0–0.1)
Basophils Relative: 0 %
Eosinophils Absolute: 0 10*3/uL (ref 0.0–0.5)
Eosinophils Relative: 0 %
HCT: 36.5 % — ABNORMAL LOW (ref 39.0–52.0)
Hemoglobin: 12.4 g/dL — ABNORMAL LOW (ref 13.0–17.0)
Immature Granulocytes: 1 %
Lymphocytes Relative: 5 %
Lymphs Abs: 0.5 10*3/uL — ABNORMAL LOW (ref 0.7–4.0)
MCH: 31.9 pg (ref 26.0–34.0)
MCHC: 34 g/dL (ref 30.0–36.0)
MCV: 93.8 fL (ref 80.0–100.0)
Monocytes Absolute: 0.4 10*3/uL (ref 0.1–1.0)
Monocytes Relative: 4 %
Neutro Abs: 9.5 10*3/uL — ABNORMAL HIGH (ref 1.7–7.7)
Neutrophils Relative %: 90 %
Platelets: 127 10*3/uL — ABNORMAL LOW (ref 150–400)
RBC: 3.89 MIL/uL — ABNORMAL LOW (ref 4.22–5.81)
RDW: 13 % (ref 11.5–15.5)
WBC: 10.6 10*3/uL — ABNORMAL HIGH (ref 4.0–10.5)
nRBC: 0 % (ref 0.0–0.2)

## 2019-03-09 LAB — COMPREHENSIVE METABOLIC PANEL
ALT: 15 U/L (ref 0–44)
AST: 36 U/L (ref 15–41)
Albumin: 3.7 g/dL (ref 3.5–5.0)
Alkaline Phosphatase: 70 U/L (ref 38–126)
Anion gap: 14 (ref 5–15)
BUN: 54 mg/dL — ABNORMAL HIGH (ref 8–23)
CO2: 23 mmol/L (ref 22–32)
Calcium: 9.6 mg/dL (ref 8.9–10.3)
Chloride: 108 mmol/L (ref 98–111)
Creatinine, Ser: 0.85 mg/dL (ref 0.61–1.24)
GFR calc Af Amer: >60 mL/min (ref >60)
GFR calc non Af Amer: >60 mL/min (ref >60)
Glucose, Bld: 128 mg/dL — ABNORMAL HIGH (ref 70–99)
Potassium: 3.8 mmol/L (ref 3.5–5.1)
Sodium: 145 mmol/L (ref 135–145)
Total Bilirubin: 1.2 mg/dL (ref 0.3–1.2)
Total Protein: 7.5 g/dL (ref 6.5–8.1)

## 2019-03-09 LAB — FIBRIN DERIVATIVES D-DIMER (ARMC ONLY): Fibrin derivatives D-dimer (ARMC): 2489.25 ng/mL (FEU) — ABNORMAL HIGH (ref 0.00–499.00)

## 2019-03-09 LAB — TROPONIN I (HIGH SENSITIVITY)
Troponin I (High Sensitivity): 14 ng/L (ref <18)
Troponin I (High Sensitivity): 16 ng/L (ref <18)

## 2019-03-09 LAB — PROCALCITONIN: Procalcitonin: 0.17 ng/mL

## 2019-03-09 LAB — FERRITIN: Ferritin: 500 ng/mL — ABNORMAL HIGH (ref 24–336)

## 2019-03-09 MED ORDER — DEXAMETHASONE SODIUM PHOSPHATE 10 MG/ML IJ SOLN
6.0000 mg | Freq: Once | INTRAMUSCULAR | Status: AC
  Administered 2019-03-09: 21:00:00 6 mg via INTRAVENOUS
  Filled 2019-03-09: qty 1

## 2019-03-09 MED ORDER — ACETAMINOPHEN 650 MG RE SUPP
650.0000 mg | Freq: Once | RECTAL | Status: AC
  Administered 2019-03-09: 650 mg via RECTAL

## 2019-03-09 NOTE — ED Notes (Signed)
Spoke with family and updated them on pt status. No word from [facility] bed availability yet.

## 2019-03-09 NOTE — ED Notes (Addendum)
Pt is hot to touch. Rectal temp taken. Temp is 100.6. Pt has had a bowel movement. Pt cleaned and new undergarment applied. Pt given a sip of water and readjusted in bed. Rapid swab walked to lab.

## 2019-03-09 NOTE — ED Triage Notes (Signed)
Pt to ER via EMS from a group home.  Pt was diagnosed with CoVid on Friday.  EMS called out for assist to get pt from wheelchair to bed and pt was found to have O2 sats in low 80s.  Pt placed on 3L [place] and O2 sats recovered into mid 90s.

## 2019-03-09 NOTE — ED Provider Notes (Signed)
[facility] Emergency Department Provider Note ____________________________________________   First MD Initiated Contact with Patient 03/09/19 1856     (approximate)  I have reviewed the triage vital signs and the nursing notes.   HISTORY  Chief Complaint Shortness of Breath  Level 5 caveat: History of present illness limited due to cerebral palsy, developmental delay  HPI [name] is a 78 y.o. male with PMH as noted below who presents for increased weakness, shortness of breath and hypoxia.  The patient was diagnosed with COVID-19 2 days ago.  When EMS arrived, he was found to have an O2 saturation in the low 80s on room air.  The patient is unable to express specific complaints.   Past Medical History:  Diagnosis Date  . Anxiety   . Atrophy, disuse, muscle   . BPH (benign prostatic hyperplasia)   . Cerebral palsy ([place])   . Eczema   . Elevated lipids   . Elevated PSA   . Exotropia   . Hematuria, gross   . Hyperlipidemia   . Incontinence   . Kidney stone   . Lower extremity edema    lymph drainage problems  . Lower urinary tract symptoms   . Lymphedema   . Mental retardation   . Over weight   . Prostate cancer ([place])   . Urinary frequency   . Urinary urgency   . UTI (urinary tract infection)   . Venous insufficiency   . Vitamin D deficiency   . Wheelchair dependence     Patient Active Problem List   Diagnosis Date Noted  . Pancytopenia ([place]) 03/04/2019  . Renal mass 07/23/2017  . Ureteral calculus 07/23/2017  . PAD (peripheral artery disease) ([place]) 07/04/2017  . Aortic valve calcification 07/04/2017  . Coronary artery calcification 07/04/2017  . Hypertension 10/26/2016  . Late effect acute polio 05/17/2016  . Chronic constipation 11/19/2015  . Mild major depression ([place]) 11/19/2015  . Complicated grieving 0000000  . Recurrent UTI 09/11/2015  . Adenocarcinoma of prostate ([place]) 03/02/2015  . Amyotrophia 03/02/2015  .  Edema leg 03/02/2015  . Cerebral palsy ([place]) 03/02/2015  . Dyslipidemia 03/02/2015  . Dermatitis, eczematoid 03/02/2015  . 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 H/O acute poliomyelitis 03/02/2015  . Lymphedema 03/02/2015  . Mental retardation 03/02/2015  . Hypertrophy of nail 03/02/2015  . Adiposity 03/02/2015  . Absence of bladder continence 03/02/2015  . Chronic venous insufficiency 03/02/2015  . Avitaminosis D 03/02/2015  . Adynamia 03/02/2015  . Dependent on wheelchair 03/02/2015  . Kidney cysts 03/02/2015  . At risk for falling 03/02/2015  . Cerebral vascular disease 03/02/2015  . Strabismus 02/11/2015  . Urge incontinence of urine 02/11/2015  . Wheelchair dependence 02/11/2015  . Obesity 02/11/2015  . Urinary frequency 10/30/2014    Past Surgical History:  Procedure Laterality Date  . CYSTOSCOPY/URETEROSCOPY/HOLMIUM LASER/STENT PLACEMENT Right 08/14/2017   Procedure: CYSTOSCOPY/URETEROSCOPY/HOLMIUM LASER/STENT PLACEMENT;  Surgeon: Abbie Sons, MD;  Location: [facility] ORS;  Service: Urology;  Laterality: Right;  . leg circulation surgery Right   . PROSTATE SURGERY  11/10/2008   BRACHYTHERAPY    Prior to Admission medications   Medication Sig Start Date End Date Taking? Authorizing Provider  alfuzosin (UROXATRAL) 10 MG 24 hr tablet TAKE 1 TABLET (10MG ) BY MOUTH ONCE DAILY WITH BREAKFAST*DO NOT CRUSH* 09/22/18  Yes Sowles, Drue Stager, MD  ALPRAZolam (XANAX) 0.25 MG tablet TAKE ONE TABLET BY MOUTH 2 TIMES A DAY AS NEEDED FOR ANXIETY (WHEN BLOOD PRESSURE IS VERY HIGH OR  ANXIOUS) 07/05/17  Yes Sowles, Drue Stager, MD  AMITIZA 24 MCG capsule TAKE ONE CAPSULE BY MOUTH TWO TIMES A DAY WITH A MEAL 09/22/18  Yes Sowles, Drue Stager, MD  aspirin (ASPIRIN ADULT LOW STRENGTH) 81 MG EC tablet TAKE ONE TABLET BY MOUTH EVERY DAY *DO NOT CRUSH* 09/22/18  Yes Sowles, Drue Stager, MD  atorvastatin (LIPITOR) 20 MG tablet Take 1 tablet (20 mg total) by mouth daily. 03/04/19  Yes Steele Sizer, MD  Calcium  Carb-Cholecalciferol 500-400 MG-UNIT TABS TAKE ONE TABLET BY MOUTH 2 TIMES A DAY 03/18/18  Yes Sowles, Drue Stager, MD  Cranberry Juice Powder 425 MG CAPS TAKE ONE CAPSULE BY MOUTH EVERY DAY 09/22/18  Yes Sowles, Drue Stager, MD  nitrofurantoin, macrocrystal-monohydrate, (MACROBID) 100 MG capsule Take 1 capsule (100 mg total) by mouth 2 (two) times daily. 03/05/19  Yes Sowles, Drue Stager, MD  sertraline (ZOLOFT) 100 MG tablet TAKE 1 TABLET BY MOUTH ONCE DAILY 04/19/18  Yes Sowles, Drue Stager, MD  vitamin C (ASCORBIC ACID) 500 MG tablet TAKE ONE TABLET BY MOUTH TWO TIMES A DAY 02/22/18  Yes Sowles, Drue Stager, MD  ARTIFICIAL TEAR SOLUTION OP Apply 2 drops to eye 2 (two) times daily as needed (dry eyes).    [provider]  betamethasone dipropionate (DIPROLENE) 0.05 % cream Apply 1 application topically as needed (rash). Patient not taking: Reported on 03/09/2019 01/20/18   Steele Sizer, MD  Elastic Bandages & Supports (MEDICAL COMPRESSION STOCKINGS) MISC 1 Units by Does not apply route daily. 07/19/15   Steele Sizer, MD  Incontinence Supply Disposable (DEPEND SILHOUETTE BRIEFS L/XL) MISC 1 each by Does not apply route 2 (two) times daily as needed. 11/30/17   Steele Sizer, MD  Polyethyl Glycol-Propyl Glycol (SYSTANE ULTRA) 0.4-0.3 % SOLN Apply 1 drop to eye 2 (two) times daily. Both eyes    [provider]  polyethylene glycol (MIRALAX / GLYCOLAX) 17 g packet Take 17 g by mouth daily.    [provider]    Allergies Patient has no known allergies.  Family History  Problem Relation Age of Onset  . Heart attack Brother   . Heart disease Other     Social History Social History   Tobacco Use  . Smoking status: Never Smoker  . Smokeless tobacco: Never Used  Substance Use Topics  . Alcohol use: No    Alcohol/week: 0.0 standard drinks  . Drug use: No    Review of Systems Level 5 caveat: Unable to obtain review of systems due to cerebral palsy, developmental  delay   ____________________________________________   PHYSICAL EXAM:  VITAL SIGNS: ED Triage Vitals  Enc Vitals Group     BP 03/09/19 1900 117/72     Pulse Rate 03/09/19 1856 83     Resp 03/09/19 1856 (!) 24     Temp 03/09/19 1856 98.2 F (36.8 C)     Temp src --      SpO2 03/09/19 1856 95 %     Weight 03/09/19 1853 182 lb 15.7 oz (83 kg)     Height 03/09/19 1853 5\' 8"  (1.727 m)     Head Circumference --      Peak Flow --      Pain Score 03/09/19 1852 0     Pain Loc --      Pain Edu? --      Excl. in [place]? --     Constitutional: Alert.  Uncomfortable appearing but in no acute distress. Eyes: Conjunctivae are normal.  Head: Atraumatic. Nose: No congestion/rhinnorhea. Mouth/Throat: Mucous  membranes are dry.   Neck: Normal range of motion.  Cardiovascular: Normal rate, regular rhythm. Good peripheral circulation. Respiratory: Increased respiratory effort.  No retractions. Gastrointestinal: Soft and nontender. No distention.  Genitourinary: No flank tenderness. Musculoskeletal: Extremities warm and well perfused.  Neurologic:  Normal speech and language. No gross focal neurologic deficits are appreciated.  Skin:  Skin is warm and dry. No rash noted. Psychiatric: Mood and affect are normal. Speech and behavior are normal.  ____________________________________________   LABS (all labs ordered are listed, but only abnormal results are displayed)  Labs Reviewed  CBC WITH DIFFERENTIAL/PLATELET - Abnormal; Notable for the following components:      Result Value   WBC 10.6 (*)    RBC 3.89 (*)    Hemoglobin 12.4 (*)    HCT 36.5 (*)    Platelets 127 (*)    Neutro Abs 9.5 (*)    Lymphs Abs 0.5 (*)    Abs Immature Granulocytes 0.11 (*)    All other components within normal limits  COMPREHENSIVE METABOLIC PANEL - Abnormal; Notable for the following components:   Glucose, Bld 128 (*)    BUN 54 (*)    All other components within normal limits  FIBRIN DERIVATIVES D-DIMER  ([facility] ONLY) - Abnormal; Notable for the following components:   Fibrin derivatives D-dimer [facility]) 2,489.25 (*)    All other components within normal limits  FERRITIN - Abnormal; Notable for the following components:   Ferritin 500 (*)    All other components within normal limits  SARS CORONAVIRUS 2 BY RT PCR (HOSPITAL ORDER, [place] [facility])  LACTIC ACID, PLASMA  PROCALCITONIN  LACTIC ACID, PLASMA  TROPONIN I (HIGH SENSITIVITY)  TROPONIN I (HIGH SENSITIVITY)   ____________________________________________  EKG  ED ECG REPORT I, Arta Silence, the attending physician, personally viewed and interpreted this ECG.  Date: 03/09/2019 EKG Time: 1901 Rate: 85 Rhythm: normal sinus rhythm with PVCs QRS Axis: normal Intervals: normal ST/T Wave abnormalities: normal Narrative Interpretation: no evidence of acute ischemia  ____________________________________________  RADIOLOGY  CXR: No obvious acute focal infiltrate  ____________________________________________   PROCEDURES  Procedure(s) performed: No  Procedures  Critical Care performed: No ____________________________________________   INITIAL IMPRESSION / ASSESSMENT AND PLAN / ED COURSE  Pertinent labs & imaging results that were available during my care of the patient were reviewed by me and considered in my medical decision making (see chart for details).  78 year old male with PMH as noted above presents from his facility with increased weakness after a recent diagnosis of COVID-19 2 days ago.  He was found to be hypoxic to the low 80s on room air.  He is not normally on oxygen.  In the ED, the patient is a weak appearing but in no acute distress.  He is somewhat tachypneic although he is maintaining an O2 saturation in the mid 90s on 2 L by nasal cannula.  The remainder of the exam is as described above.  Overall presentation is consistent with COVID-19.  We will obtain chest x-ray, lab  work-up, and reassess.  We will plan for admission given the hypoxia.  ----------------------------------------- 11:13 PM on 03/09/2019 -----------------------------------------  Chest x-ray no obvious infiltrates.  [name] D-dimer and ferritin are elevated consistent with COVID-19.  Because we do not have documentation available of the patient's COVID-19 diagnosis, I discussed with the [facility].  The patient will need a rapid Covid swab in order to confirm the diagnosis, and if we confirm that it is  positive he will be transferred to [facility]  I have signed the patient out to Dr. Alfred Levins.  ____________________________________________   FINAL CLINICAL IMPRESSION(S) / ED DIAGNOSES  Final diagnoses:  COVID-19  Hypoxia      NEW MEDICATIONS STARTED DURING THIS VISIT:  New Prescriptions   No medications on file     Note:  This document was prepared using Dragon voice recognition software and may include unintentional dictation errors.    Arta Silence, MD 03/09/19 2315

## 2019-03-09 NOTE — ED Notes (Signed)
Pt bed adjusted so he can see the TV.

## 2019-03-10 ENCOUNTER — Inpatient Hospital Stay (HOSPITAL_COMMUNITY)
Admission: AD | Admit: 2019-03-10 | Discharge: 2019-03-14 | DRG: 177 | Disposition: A | Discharge status: Home / Self Care | Payer: Medicare Other | Source: Other Acute Inpatient Hospital | Attending: Internal Medicine | Admitting: Internal Medicine

## 2019-03-10 DIAGNOSIS — U071 COVID-19: Principal | ICD-10-CM | POA: Diagnosis present

## 2019-03-10 DIAGNOSIS — G800 Spastic quadriplegic cerebral palsy: Secondary | ICD-10-CM

## 2019-03-10 DIAGNOSIS — J9601 Acute respiratory failure with hypoxia: Secondary | ICD-10-CM | POA: Diagnosis not present

## 2019-03-10 DIAGNOSIS — E440 Moderate protein-calorie malnutrition: Secondary | ICD-10-CM | POA: Diagnosis present

## 2019-03-10 DIAGNOSIS — Z993 Dependence on wheelchair: Secondary | ICD-10-CM | POA: Diagnosis not present

## 2019-03-10 DIAGNOSIS — E785 Hyperlipidemia, unspecified: Secondary | ICD-10-CM | POA: Diagnosis present

## 2019-03-10 DIAGNOSIS — Z79899 Other long term (current) drug therapy: Secondary | ICD-10-CM | POA: Diagnosis not present

## 2019-03-10 DIAGNOSIS — M2550 Pain in unspecified joint: Secondary | ICD-10-CM | POA: Diagnosis not present

## 2019-03-10 DIAGNOSIS — N179 Acute kidney failure, unspecified: Secondary | ICD-10-CM

## 2019-03-10 DIAGNOSIS — Z7401 Bed confinement status: Secondary | ICD-10-CM | POA: Diagnosis not present

## 2019-03-10 DIAGNOSIS — Z7982 Long term (current) use of aspirin: Secondary | ICD-10-CM | POA: Diagnosis not present

## 2019-03-10 DIAGNOSIS — G809 Cerebral palsy, unspecified: Secondary | ICD-10-CM

## 2019-03-10 DIAGNOSIS — N400 Enlarged prostate without lower urinary tract symptoms: Secondary | ICD-10-CM | POA: Diagnosis present

## 2019-03-10 DIAGNOSIS — E870 Hyperosmolality and hypernatremia: Secondary | ICD-10-CM | POA: Diagnosis present

## 2019-03-10 DIAGNOSIS — R001 Bradycardia, unspecified: Secondary | ICD-10-CM | POA: Diagnosis not present

## 2019-03-10 DIAGNOSIS — Z8249 Family history of ischemic heart disease and other diseases of the circulatory system: Secondary | ICD-10-CM

## 2019-03-10 DIAGNOSIS — K5909 Other constipation: Secondary | ICD-10-CM

## 2019-03-10 DIAGNOSIS — I10 Essential (primary) hypertension: Secondary | ICD-10-CM | POA: Diagnosis present

## 2019-03-10 DIAGNOSIS — F329 Major depressive disorder, single episode, unspecified: Secondary | ICD-10-CM | POA: Diagnosis present

## 2019-03-10 DIAGNOSIS — F79 Unspecified intellectual disabilities: Secondary | ICD-10-CM | POA: Diagnosis not present

## 2019-03-10 DIAGNOSIS — E559 Vitamin D deficiency, unspecified: Secondary | ICD-10-CM | POA: Diagnosis present

## 2019-03-10 DIAGNOSIS — E663 Overweight: Secondary | ICD-10-CM | POA: Diagnosis present

## 2019-03-10 DIAGNOSIS — F419 Anxiety disorder, unspecified: Secondary | ICD-10-CM | POA: Diagnosis present

## 2019-03-10 DIAGNOSIS — R0902 Hypoxemia: Secondary | ICD-10-CM | POA: Diagnosis not present

## 2019-03-10 DIAGNOSIS — L8990 Pressure ulcer of unspecified site, unspecified stage: Secondary | ICD-10-CM | POA: Diagnosis present

## 2019-03-10 DIAGNOSIS — I739 Peripheral vascular disease, unspecified: Secondary | ICD-10-CM | POA: Diagnosis present

## 2019-03-10 DIAGNOSIS — Z6822 Body mass index (BMI) 22.0-22.9, adult: Secondary | ICD-10-CM

## 2019-03-10 DIAGNOSIS — Z8546 Personal history of malignant neoplasm of prostate: Secondary | ICD-10-CM | POA: Diagnosis not present

## 2019-03-10 DIAGNOSIS — E876 Hypokalemia: Secondary | ICD-10-CM | POA: Diagnosis present

## 2019-03-10 DIAGNOSIS — E860 Dehydration: Secondary | ICD-10-CM | POA: Diagnosis present

## 2019-03-10 DIAGNOSIS — Z87442 Personal history of urinary calculi: Secondary | ICD-10-CM

## 2019-03-10 DIAGNOSIS — J1289 Other viral pneumonia: Secondary | ICD-10-CM | POA: Diagnosis present

## 2019-03-10 LAB — CBC WITH DIFFERENTIAL/PLATELET
Abs Immature Granulocytes: 0.08 10*3/uL — ABNORMAL HIGH (ref 0.00–0.07)
Basophils Absolute: 0 10*3/uL (ref 0.0–0.1)
Basophils Relative: 0 %
Eosinophils Absolute: 0 10*3/uL (ref 0.0–0.5)
Eosinophils Relative: 0 %
HCT: 36.3 % — ABNORMAL LOW (ref 39.0–52.0)
Hemoglobin: 12.2 g/dL — ABNORMAL LOW (ref 13.0–17.0)
Immature Granulocytes: 1 %
Lymphocytes Relative: 6 %
Lymphs Abs: 0.5 10*3/uL — ABNORMAL LOW (ref 0.7–4.0)
MCH: 32.6 pg (ref 26.0–34.0)
MCHC: 33.6 g/dL (ref 30.0–36.0)
MCV: 97.1 fL (ref 80.0–100.0)
Monocytes Absolute: 0.2 10*3/uL (ref 0.1–1.0)
Monocytes Relative: 2 %
Neutro Abs: 7.3 10*3/uL (ref 1.7–7.7)
Neutrophils Relative %: 91 %
Platelets: 133 10*3/uL — ABNORMAL LOW (ref 150–400)
RBC: 3.74 MIL/uL — ABNORMAL LOW (ref 4.22–5.81)
RDW: 13.1 % (ref 11.5–15.5)
WBC: 8 10*3/uL (ref 4.0–10.5)
nRBC: 0 % (ref 0.0–0.2)

## 2019-03-10 LAB — ABO/RH: ABO/RH(D): B POS

## 2019-03-10 LAB — D-DIMER, QUANTITATIVE: D-Dimer, Quant: 1.93 ug/mL-FEU — ABNORMAL HIGH (ref 0.00–0.50)

## 2019-03-10 LAB — PHOSPHORUS: Phosphorus: 3.8 mg/dL (ref 2.5–4.6)

## 2019-03-10 LAB — COMPREHENSIVE METABOLIC PANEL
ALT: 16 U/L (ref 0–44)
AST: 38 U/L (ref 15–41)
Albumin: 3.2 g/dL — ABNORMAL LOW (ref 3.5–5.0)
Alkaline Phosphatase: 70 U/L (ref 38–126)
Anion gap: 10 (ref 5–15)
BUN: 52 mg/dL — ABNORMAL HIGH (ref 8–23)
CO2: 27 mmol/L (ref 22–32)
Calcium: 8.9 mg/dL (ref 8.9–10.3)
Chloride: 109 mmol/L (ref 98–111)
Creatinine, Ser: 0.67 mg/dL (ref 0.61–1.24)
GFR calc Af Amer: >60 mL/min (ref >60)
GFR calc non Af Amer: >60 mL/min (ref >60)
Glucose, Bld: 133 mg/dL — ABNORMAL HIGH (ref 70–99)
Potassium: 3.6 mmol/L (ref 3.5–5.1)
Sodium: 146 mmol/L — ABNORMAL HIGH (ref 135–145)
Total Bilirubin: 0.9 mg/dL (ref 0.3–1.2)
Total Protein: 7.1 g/dL (ref 6.5–8.1)

## 2019-03-10 LAB — FERRITIN: Ferritin: 463 ng/mL — ABNORMAL HIGH (ref 24–336)

## 2019-03-10 LAB — MAGNESIUM: Magnesium: 2.3 mg/dL (ref 1.7–2.4)

## 2019-03-10 LAB — MRSA PCR SCREENING: MRSA by PCR: NEGATIVE

## 2019-03-10 LAB — LACTATE DEHYDROGENASE: LDH: 264 U/L — ABNORMAL HIGH (ref 98–192)

## 2019-03-10 LAB — C-REACTIVE PROTEIN: CRP: 19.6 mg/dL — ABNORMAL HIGH (ref <1.0)

## 2019-03-10 LAB — SARS CORONAVIRUS 2 BY RT PCR (HOSPITAL ORDER, PERFORMED IN ~~LOC~~ HOSPITAL LAB): SARS Coronavirus 2: POSITIVE — AB

## 2019-03-10 MED ORDER — ALPRAZOLAM 0.25 MG PO TABS: 0.2500 mg | ORAL_TABLET | Freq: Two times a day (BID) | ORAL | Status: DC | PRN

## 2019-03-10 MED ORDER — ACETAMINOPHEN 325 MG PO TABS: 650.0000 mg | ORAL_TABLET | Freq: Four times a day (QID) | ORAL | Status: DC | PRN

## 2019-03-10 MED ORDER — SODIUM CHLORIDE 0.9 % IV SOLN
INTRAVENOUS | Status: DC
  Administered 2019-03-10 (×2): via INTRAVENOUS

## 2019-03-10 MED ORDER — DEXAMETHASONE 6 MG PO TABS
6.0000 mg | ORAL_TABLET | ORAL | Status: DC
  Administered 2019-03-10 – 2019-03-11 (×2): 6 mg via ORAL
  Filled 2019-03-10 (×2): qty 1

## 2019-03-10 MED ORDER — ENOXAPARIN SODIUM 40 MG/0.4ML ~~LOC~~ SOLN
40.0000 mg | SUBCUTANEOUS | Status: DC
  Administered 2019-03-10 – 2019-03-13 (×4): 40 mg via SUBCUTANEOUS
  Filled 2019-03-10 (×4): qty 0.4

## 2019-03-10 MED ORDER — NITROFURANTOIN MONOHYD MACRO 100 MG PO CAPS
100.0000 mg | ORAL_CAPSULE | Freq: Two times a day (BID) | ORAL | Status: DC
  Administered 2019-03-10 – 2019-03-12 (×4): 100 mg via ORAL
  Filled 2019-03-10 (×5): qty 1

## 2019-03-10 MED ORDER — ALBUTEROL SULFATE HFA 108 (90 BASE) MCG/ACT IN AERS
2.0000 | INHALATION_SPRAY | Freq: Four times a day (QID) | RESPIRATORY_TRACT | Status: DC
  Administered 2019-03-10 – 2019-03-14 (×17): 2 via RESPIRATORY_TRACT
  Filled 2019-03-10: qty 6.7

## 2019-03-10 MED ORDER — HYDROCOD POLST-CPM POLST ER 10-8 MG/5ML PO SUER: 5.0000 mL | Freq: Two times a day (BID) | ORAL | Status: DC | PRN

## 2019-03-10 MED ORDER — SODIUM CHLORIDE 0.9 % IV SOLN
200.0000 mg | Freq: Once | INTRAVENOUS | Status: AC
  Administered 2019-03-10: 200 mg via INTRAVENOUS
  Filled 2019-03-10: qty 40

## 2019-03-10 MED ORDER — ATORVASTATIN CALCIUM 10 MG PO TABS
20.0000 mg | ORAL_TABLET | Freq: Every day | ORAL | Status: DC
  Administered 2019-03-10 – 2019-03-14 (×5): 20 mg via ORAL
  Filled 2019-03-10 (×5): qty 2

## 2019-03-10 MED ORDER — LUBIPROSTONE 24 MCG PO CAPS
24.0000 ug | ORAL_CAPSULE | Freq: Two times a day (BID) | ORAL | Status: DC
  Administered 2019-03-10 – 2019-03-14 (×7): 24 ug via ORAL
  Filled 2019-03-10 (×11): qty 1

## 2019-03-10 MED ORDER — ONDANSETRON HCL 4 MG PO TABS: 4.0000 mg | ORAL_TABLET | Freq: Four times a day (QID) | ORAL | Status: DC | PRN

## 2019-03-10 MED ORDER — SODIUM CHLORIDE 0.9 % IV SOLN
100.0000 mg | INTRAVENOUS | Status: AC
  Administered 2019-03-11 – 2019-03-14 (×4): 100 mg via INTRAVENOUS
  Filled 2019-03-10 (×4): qty 100

## 2019-03-10 MED ORDER — POLYVINYL ALCOHOL 1.4 % OP SOLN
1.0000 [drp] | Freq: Two times a day (BID) | OPHTHALMIC | Status: DC
  Administered 2019-03-10 – 2019-03-14 (×8): 1 [drp] via OPHTHALMIC
  Filled 2019-03-10: qty 15

## 2019-03-10 MED ORDER — GUAIFENESIN-DM 100-10 MG/5ML PO SYRP: 10.0000 mL | ORAL_SOLUTION | ORAL | Status: DC | PRN

## 2019-03-10 MED ORDER — SERTRALINE HCL 50 MG PO TABS
100.0000 mg | ORAL_TABLET | Freq: Every day | ORAL | Status: DC
  Administered 2019-03-10 – 2019-03-14 (×5): 100 mg via ORAL
  Filled 2019-03-10 (×5): qty 2

## 2019-03-10 MED ORDER — ASPIRIN 81 MG PO TBEC
81.0000 mg | DELAYED_RELEASE_TABLET | Freq: Every day | ORAL | Status: DC
  Administered 2019-03-10 – 2019-03-14 (×5): 81 mg via ORAL
  Filled 2019-03-10 (×8): qty 1

## 2019-03-10 MED ORDER — POLYETHYLENE GLYCOL 3350 17 G PO PACK
17.0000 g | PACK | Freq: Every day | ORAL | Status: DC
  Administered 2019-03-10 – 2019-03-14 (×4): 17 g via ORAL
  Filled 2019-03-10 (×4): qty 1

## 2019-03-10 MED ORDER — ALFUZOSIN HCL ER 10 MG PO TB24
10.0000 mg | ORAL_TABLET | Freq: Every day | ORAL | Status: DC
  Administered 2019-03-10 – 2019-03-14 (×5): 10 mg via ORAL
  Filled 2019-03-10 (×6): qty 1

## 2019-03-10 MED ORDER — ONDANSETRON HCL 4 MG/2ML IJ SOLN: 4.0000 mg | Freq: Four times a day (QID) | INTRAMUSCULAR | Status: DC | PRN

## 2019-03-10 MED ORDER — ENOXAPARIN SODIUM 80 MG/0.8ML ~~LOC~~ SOLN
80.0000 mg | Freq: Two times a day (BID) | SUBCUTANEOUS | Status: DC
  Administered 2019-03-10: 80 mg via SUBCUTANEOUS
  Filled 2019-03-10: qty 0.8

## 2019-03-10 NOTE — Progress Notes (Signed)
PROGRESS NOTE    [name]  [id number] DOB: 1941-01-25 DOA: 03/10/2019 PCP: Steele Sizer, MD   Brief Narrative:  78 y.o. WM PMHx mental retardation,.  Anxiety, cerebral palsy, HLD, nephrolithiasis, overweight, vitamin D deficiency wheelchair dependent  EMS called to group home for increased, weakness, SOB, hypoxia.  Patient diagnosed with COVID x2 days ago.  Satting low 80s on RA when EMS arrived.  Patient brought in to ED.  Due to CP, patient not really able to express specific complaints.   ED Course: Pt now satting 94% on 3L [place], Procalcitonin of 0.17, D.dimer is elevated.  WBC 10.9k.  CXR neg, COVID positive.  Got remdesivir and decadron in ED.   Subjective: T-max overnight 38.1 C, negative CP, negative abdominal pain.  Positive S OB.  Patient states has a motorized wheelchair.  Assessment & Plan:   Principal Problem:   Acute hypoxemic respiratory failure due to COVID-19 [facility]) Active Problems:   Cerebral palsy (HCC)   Intellectual disability   Dependent on wheelchair   Chronic constipation   AKI (acute kidney injury) ([place])   Covid pneumonia/acute respiratory failure with hypoxia COVID-19 Labs  Recent Labs    03/09/19 1926 03/10/19 0750  DDIMER  --  1.93*  FERRITIN 500* 463*  LDH  --  264*  CRP  --  19.6*    Lab Results  Component Value Date   SARSCOV2NAA POSITIVE (A) 03/09/2019  -Decadron 6 mg daily -Remdesivir per pharmacy  Cerebral palsy/mental retardation -Chronic and stable  Acute kidney injury Recent Labs  Lab 03/09/19 1926 03/10/19 0750  CREATININE 0.85 0.67  -Resolved -Strict in and out -Daily weight   Chronic constipation    DVT prophylaxis: Lovenox per pharmacy Covid protocol Code Status: Full Family Communication:  Disposition Plan: TBD   Consultants:    Procedures/Significant Events:  PCXR 11/15;-very rotated AP portable examination with probable atelectasis or scarring of the left lung base,  similar in appearance to prior examination. No definite acute airspace opacity    I have personally reviewed and interpreted all radiology studies and my findings are as above.  VENTILATOR SETTINGS: Nasal cannula  Flow rate; 2 L/min SPO2; 94%    Cultures 11/15 SARS coronavirus positive 11/16 MRSA by PCR negative   Antimicrobials: Anti-infectives (From admission, onward)   Start     Stop   03/11/19 1000  remdesivir 100 mg in sodium chloride 0.9 % 250 mL IVPB     03/15/19 0959   03/10/19 1000  nitrofurantoin (macrocrystal-monohydrate) (MACROBID) capsule 100 mg             Devices    LINES / TUBES:      Continuous Infusions: . sodium chloride    . [START ON 03/11/2019] remdesivir 100 mg in NS 250 mL       Objective: Vitals:   03/10/19 0615 03/10/19 0700  BP: 101/81 107/64  Pulse:  63  Resp: (!) 22 14  Temp: 98.2 F (36.8 C)   TempSrc: Oral   SpO2: 93% 94%   No intake or output data in the 24 hours ending 03/10/19 0804 There were no vitals filed for this visit.  Examination:  General: A/O x4, positive  acute respiratory distress Eyes: negative scleral hemorrhage, negative anisocoria, negative icterus ENT: Negative Runny nose, negative gingival bleeding, Neck:  Negative scars, masses, torticollis, lymphadenopathy, JVD Lungs: Clear to auscultation bilaterally without wheezes or crackles Cardiovascular: Regular rate and rhythm without murmur gallop or rub normal S1 and S2 Abdomen: negative  abdominal pain, nondistended, positive soft, bowel sounds, no rebound, no ascites, no appreciable mass Extremities: No significant cyanosis, clubbing, or edema bilateral lower extremities Skin: Negative rashes, lesions, ulcers Psychiatric:  Negative depression, negative anxiety, negative fatigue, negative mania  Central nervous system:  Cranial nerves II through XII intact, tongue/uvula midline, all extremities muscle strength 5/5, sensation intact throughout, positive  dysarthria, negative expressive aphasia, negative receptive aphasia .     Data Reviewed: Care during the described time interval was provided by me .  I have reviewed this patient's available data, including medical history, events of note, physical examination, and all test results as part of my evaluation.   CBC: Recent Labs  Lab 03/09/19 1926  WBC 10.6*  NEUTROABS 9.5*  HGB 12.4*  HCT 36.5*  MCV 93.8  PLT [id number]*   Basic Metabolic Panel: Recent Labs  Lab 03/09/19 1926  NA 145  K 3.8  CL 108  CO2 23  GLUCOSE 128*  BUN 54*  CREATININE 0.85  CALCIUM 9.6   GFR: Estimated Creatinine Clearance: 77.6 mL/min (by C-G formula based on SCr of 0.85 mg/dL). Liver Function Tests: Recent Labs  Lab 03/09/19 1926  AST 36  ALT 15  ALKPHOS 70  BILITOT 1.2  PROT 7.5  ALBUMIN 3.7   No results for input(s): LIPASE, AMYLASE in the last 168 hours. No results for input(s): AMMONIA in the last 168 hours. Coagulation Profile: No results for input(s): INR, PROTIME in the last 168 hours. Cardiac Enzymes: No results for input(s): CKTOTAL, CKMB, CKMBINDEX, TROPONINI in the last 168 hours. BNP (last 3 results) No results for input(s): PROBNP in the last 8760 hours. HbA1C: No results for input(s): HGBA1C in the last 72 hours. CBG: No results for input(s): GLUCAP in the last 168 hours. Lipid Profile: No results for input(s): CHOL, HDL, LDLCALC, TRIG, CHOLHDL, LDLDIRECT in the last 72 hours. Thyroid Function Tests: No results for input(s): TSH, T4TOTAL, FREET4, T3FREE, THYROIDAB in the last 72 hours. Anemia Panel: Recent Labs    03/09/19 1926  FERRITIN 500*   Urine analysis:    Component Value Date/Time   COLORURINE RED (A) 07/02/2017 1809   APPEARANCEUR Cloudy (A) 10/16/2018 1506   LABSPEC 1.012 07/02/2017 1809   LABSPEC 1.012 05/25/2013 1506   PHURINE  07/02/2017 1809    TEST NOT REPORTED DUE TO COLOR INTERFERENCE OF URINE PIGMENT   GLUCOSEU Negative 10/16/2018 1506    GLUCOSEU Negative 05/25/2013 1506   HGBUR (A) 07/02/2017 1809    TEST NOT REPORTED DUE TO COLOR INTERFERENCE OF URINE PIGMENT   BILIRUBINUR Negative 10/16/2018 1506   BILIRUBINUR Negative 05/25/2013 1506   KETONESUR (A) 07/02/2017 1809    TEST NOT REPORTED DUE TO COLOR INTERFERENCE OF URINE PIGMENT   PROTEINUR Negative 10/16/2018 1506   PROTEINUR (A) 07/02/2017 1809    TEST NOT REPORTED DUE TO COLOR INTERFERENCE OF URINE PIGMENT   UROBILINOGEN 0.2 10/03/2018 1032   NITRITE Positive (A) 10/16/2018 1506   NITRITE (A) 07/02/2017 1809    TEST NOT REPORTED DUE TO COLOR INTERFERENCE OF URINE PIGMENT   LEUKOCYTESUR Trace (A) 10/16/2018 1506   LEUKOCYTESUR Negative 05/25/2013 1506   Sepsis Labs: @LABRCNTIP (procalcitonin:4,lacticidven:4)  ) Recent Results (from the past 240 hour(s))  SARS Coronavirus 2 by RT PCR (hospital order, performed in [place] hospital lab) Nasopharyngeal Nasopharyngeal Swab     Status: Abnormal   Collection Time: 03/09/19 10:25 PM   Specimen: Nasopharyngeal Swab  Result Value Ref Range Status   SARS Coronavirus 2  POSITIVE (A) NEGATIVE Final    Comment: RESULT CALLED TO, READ BACK BY AND VERIFIED WITH: SHERRY ALLISON @029  03/10/2019 TTG (NOTE) If result is NEGATIVE SARS-CoV-2 target nucleic acids are NOT DETECTED. The SARS-CoV-2 RNA is generally detectable in upper and lower  respiratory specimens during the acute phase of infection. The lowest  concentration of SARS-CoV-2 viral copies this assay can detect is 250  copies / mL. A negative result does not preclude SARS-CoV-2 infection  and should not be used as the sole basis for treatment or other  patient management decisions.  A negative result may occur with  improper specimen collection / handling, submission of specimen other  than nasopharyngeal swab, presence of viral mutation(s) within the  areas targeted by this assay, and inadequate number of viral copies  (<250 copies / mL). A negative result must  be combined with clinical  observations, patient history, and epidemiological information. If result is POSITIVE SARS-CoV-2 target nucleic acids are DETECTED. Th e SARS-CoV-2 RNA is generally detectable in upper and lower  respiratory specimens during the acute phase of infection.  Positive  results are indicative of active infection with SARS-CoV-2.  Clinical  correlation with patient history and other diagnostic information is  necessary to determine patient infection status.  Positive results do  not rule out bacterial infection or co-infection with other viruses. If result is PRESUMPTIVE POSTIVE SARS-CoV-2 nucleic acids MAY BE PRESENT.   A presumptive positive result was obtained on the submitted specimen  and confirmed on repeat testing.  While 2019 novel coronavirus  (SARS-CoV-2) nucleic acids may be present in the submitted sample  additional confirmatory testing may be necessary for epidemiological  and / or clinical management purposes  to differentiate between  SARS-CoV-2 and other Sarbecovirus currently known to infect humans.  If clinically indicated additional testing with an alternate test  methodology [phone]) is  advised. The SARS-CoV-2 RNA is generally  detectable in upper and lower respiratory specimens during the acute  phase of infection. The expected result is Negative. Fact Sheet for Patients:  StrictlyIdeas.no Fact Sheet for Healthcare Providers: BankingDealers.co.za This test is not yet approved or cleared by the [place] FDA and has been authorized for detection and/or diagnosis of SARS-CoV-2 by FDA under an Emergency Use Authorization (EUA).  This EUA will remain in effect (meaning this test can be used) for the duration of the COVID-19 declaration under Section 564(b)(1) of the Act, 21 U.S.C. section 360bbb-3(b)(1), unless the authorization is terminated or revoked sooner. Performed at [facility], [address], [place], [place] [address]          Radiology Studies: Dg Chest [place] 1 View  Result Date: 03/09/2019 CLINICAL DATA:  COVID-19, hypoxia EXAM: PORTABLE CHEST 1 VIEW COMPARISON:  08/14/2013 FINDINGS: Very rotated AP portable examination with probable atelectasis or scarring of the left lung base, similar in appearance to prior examination. The heart and mediastinum are unremarkable. IMPRESSION: Very rotated AP portable examination with probable atelectasis or scarring of the left lung base, similar in appearance to prior examination. No definite acute airspace opacity. Electronically Signed   By: Eddie Candle M.D.   On: 03/09/2019 19:53        Scheduled Meds: . albuterol  2 puff Inhalation Q6H  . alfuzosin  10 mg Oral Q breakfast  . aspirin  81 mg Oral Daily  . atorvastatin  20 mg Oral Daily  . dexamethasone  6 mg Oral Q24H  . enoxaparin (LOVENOX) injection  40  mg Subcutaneous Q24H  . lubiprostone  24 mcg Oral BID WC  . nitrofurantoin (macrocrystal-monohydrate)  100 mg Oral BID  . polyethylene glycol  17 g Oral Daily  . polyvinyl alcohol  1 drop Both Eyes BID  . sertraline  100 mg Oral Daily   Continuous Infusions: . sodium chloride    . [START ON 03/11/2019] remdesivir 100 mg in NS 250 mL       LOS: 0 days   The patient is critically ill with multiple organ systems failure and requires high complexity decision making for assessment and support, frequent evaluation and titration of therapies, application of advanced monitoring technologies and extensive interpretation of multiple databases. Critical Care Time devoted to patient care services described in this note  Time spent: 40 minutes     , Geraldo Docker, MD [facility] Pager [phone]  If 7PM-7AM, please contact night-coverage [email or web address] Password [email or web address] 03/10/2019, 8:04 AM

## 2019-03-10 NOTE — Progress Notes (Signed)
This nurse spoke with the patient's niece Mickie Kay today who explained that the patient's legal guardian is homeless and unable to meet the needs for the patient. Eugene Garnet is in the process of waiting for a court date to have the legal guardianship transferred to her. She updated this nurse of the patients information and provided numbers for the social worker and nurse at [name] that assist the patient. The phone numbers are listed below.  Butch Penny (nurse) [phone] Isaias Sakai (social work) [phone]

## 2019-03-10 NOTE — ED Notes (Signed)
Patient is resting comfortably. 

## 2019-03-10 NOTE — H&P (Signed)
History and Physical    [name] [id number] DOB: 1940-08-17 DOA: 03/10/2019  PCP: Steele Sizer, MD  Patient coming from: [facility], lives in group home  I have personally briefly reviewed patient's old medical records in [place]  Chief Complaint: Hypoxia, generalized weakness  HPI: [name] is a 78 y.o. male with medical history significant of CP, intellectual disability.  EMS called to group home for increased, weakness, SOB, hypoxia.  Patient diagnosed with COVID x2 days ago.  Satting low 80s on RA when EMS arrived.  Patient brought in to ED.  Due to CP, patient not really able to express specific complaints.   ED Course: Pt now satting 94% on 3L [place], Procalcitonin of 0.17, D.dimer is elevated.  WBC 10.9k.  CXR neg, COVID positive.  Got remdesivir and decadron in ED.   Review of Systems: As per HPI, otherwise all review of systems negative, difficult to perform though given intellectual disability.  Past Medical History:  Diagnosis Date  . Anxiety   . Atrophy, disuse, muscle   . BPH (benign prostatic hyperplasia)   . Cerebral palsy ([place])   . Eczema   . Elevated lipids   . Elevated PSA   . Exotropia   . Hematuria, gross   . Hyperlipidemia   . Incontinence   . Kidney stone   . Lower extremity edema    lymph drainage problems  . Lower urinary tract symptoms   . Lymphedema   . Mental retardation   . Over weight   . Prostate cancer ([place])   . Urinary frequency   . Urinary urgency   . UTI (urinary tract infection)   . Venous insufficiency   . Vitamin D deficiency   . Wheelchair dependence     Past Surgical History:  Procedure Laterality Date  . CYSTOSCOPY/URETEROSCOPY/HOLMIUM LASER/STENT PLACEMENT Right 08/14/2017   Procedure: CYSTOSCOPY/URETEROSCOPY/HOLMIUM LASER/STENT PLACEMENT;  Surgeon: Abbie Sons, MD;  Location: [facility] ORS;  Service: Urology;  Laterality: Right;  . leg circulation surgery Right   . PROSTATE SURGERY   11/10/2008   BRACHYTHERAPY     reports that he has never smoked. He has never used smokeless tobacco. He reports that he does not drink alcohol or use drugs.  No Known Allergies  Family History  Problem Relation Age of Onset  . Heart attack Brother   . Heart disease Other      Prior to Admission medications   Medication Sig Start Date End Date Taking? Authorizing Provider  alfuzosin (UROXATRAL) 10 MG 24 hr tablet TAKE 1 TABLET (10MG ) BY MOUTH ONCE DAILY WITH BREAKFAST*DO NOT CRUSH* 09/22/18   Sowles, Drue Stager, MD  ALPRAZolam (XANAX) 0.25 MG tablet TAKE ONE TABLET BY MOUTH 2 TIMES A DAY AS NEEDED FOR ANXIETY (WHEN BLOOD PRESSURE IS VERY HIGH OR ANXIOUS) 07/05/17   Ancil Boozer, Drue Stager, MD  AMITIZA 24 MCG capsule TAKE ONE CAPSULE BY MOUTH TWO TIMES A DAY WITH A MEAL 09/22/18   Sowles, Drue Stager, MD  ARTIFICIAL TEAR SOLUTION OP Apply 2 drops to eye 2 (two) times daily as needed (dry eyes).    [provider]  aspirin (ASPIRIN ADULT LOW STRENGTH) 81 MG EC tablet TAKE ONE TABLET BY MOUTH EVERY DAY *DO NOT CRUSH* 09/22/18   Sowles, Drue Stager, MD  atorvastatin (LIPITOR) 20 MG tablet Take 1 tablet (20 mg total) by mouth daily. 03/04/19   Steele Sizer, MD  betamethasone dipropionate (DIPROLENE) 0.05 % cream Apply 1 application topically as needed (rash). Patient not taking: Reported  on 03/09/2019 01/20/18   Steele Sizer, MD  Calcium Carb-Cholecalciferol 500-400 MG-UNIT TABS TAKE ONE TABLET BY MOUTH 2 TIMES A DAY 03/18/18   Steele Sizer, MD  Cranberry Juice Powder 425 MG CAPS TAKE ONE CAPSULE BY MOUTH EVERY DAY 09/22/18   Steele Sizer, MD  Elastic Bandages & Supports (MEDICAL COMPRESSION STOCKINGS) MISC 1 Units by Does not apply route daily. 07/19/15   Steele Sizer, MD  Incontinence Supply Disposable (DEPEND SILHOUETTE BRIEFS L/XL) MISC 1 each by Does not apply route 2 (two) times daily as needed. 11/30/17   Steele Sizer, MD  nitrofurantoin, macrocrystal-monohydrate, (MACROBID) 100 MG  capsule Take 1 capsule (100 mg total) by mouth 2 (two) times daily. 03/05/19   Steele Sizer, MD  Polyethyl Glycol-Propyl Glycol (SYSTANE ULTRA) 0.4-0.3 % SOLN Apply 1 drop to eye 2 (two) times daily. Both eyes    [provider]  polyethylene glycol (MIRALAX / GLYCOLAX) 17 g packet Take 17 g by mouth daily.    [provider]  sertraline (ZOLOFT) 100 MG tablet TAKE 1 TABLET BY MOUTH ONCE DAILY 04/19/18   Steele Sizer, MD  vitamin C (ASCORBIC ACID) 500 MG tablet TAKE ONE TABLET BY MOUTH TWO TIMES A DAY 02/22/18   Steele Sizer, MD    Physical Exam: Vitals:   03/10/19 0615  BP: 101/81  Resp: (!) 22  Temp: 98.2 F (36.8 C)  TempSrc: Oral  SpO2: 93%    Constitutional: NAD, calm, comfortable Eyes: Dysconjugate gaze, lids and conjunctivae normal ENMT: Mucous membranes are moist. Posterior pharynx clear of any exudate or lesions.Normal dentition.  Neck: normal, supple, no masses, no thyromegaly Respiratory: clear to auscultation bilaterally, no wheezing, no crackles. Normal respiratory effort. No accessory muscle use.  Cardiovascular: Regular rate and rhythm, no murmurs / rubs / gallops. No extremity edema. 2+ pedal pulses. No carotid bruits.  Abdomen: no tenderness, no masses palpated. No hepatosplenomegaly. Bowel sounds positive.  Musculoskeletal: no clubbing / cyanosis. No joint deformity upper and lower extremities. Good ROM, no contractures. Normal muscle tone.  Skin: no rashes, lesions, ulcers. No induration Neurologic: CN 2-12 grossly intact. Sensation intact, DTR normal. Strength 5/5 in all 4.  Psychiatric: Normal judgment and insight. Alert and oriented to person and place, not really situation, suspect this may be baseline though based on PCP notes. Normal mood.    Labs on Admission: I have personally reviewed following labs and imaging studies  CBC: Recent Labs  Lab 03/09/19 1926  WBC 10.6*  NEUTROABS 9.5*  HGB 12.4*  HCT 36.5*  MCV 93.8  PLT  [id number]   Basic Metabolic Panel: Recent Labs  Lab 03/09/19 1926  NA 145  K 3.8  CL 108  CO2 23  GLUCOSE 128*  BUN 54*  CREATININE 0.85  CALCIUM 9.6   GFR: Estimated Creatinine Clearance: 77.6 mL/min (by C-G formula based on SCr of 0.85 mg/dL). Liver Function Tests: Recent Labs  Lab 03/09/19 1926  AST 36  ALT 15  ALKPHOS 70  BILITOT 1.2  PROT 7.5  ALBUMIN 3.7   No results for input(s): LIPASE, AMYLASE in the last 168 hours. No results for input(s): AMMONIA in the last 168 hours. Coagulation Profile: No results for input(s): INR, PROTIME in the last 168 hours. Cardiac Enzymes: No results for input(s): CKTOTAL, CKMB, CKMBINDEX, TROPONINI in the last 168 hours. BNP (last 3 results) No results for input(s): PROBNP in the last 8760 hours. HbA1C: No results for input(s): HGBA1C in the last 72 hours. CBG: No results for  input(s): GLUCAP in the last 168 hours. Lipid Profile: No results for input(s): CHOL, HDL, LDLCALC, TRIG, CHOLHDL, LDLDIRECT in the last 72 hours. Thyroid Function Tests: No results for input(s): TSH, T4TOTAL, FREET4, T3FREE, THYROIDAB in the last 72 hours. Anemia Panel: Recent Labs    03/09/19 1926  FERRITIN 500*   Urine analysis:    Component Value Date/Time   COLORURINE RED (A) 07/02/2017 1809   APPEARANCEUR Cloudy (A) 10/16/2018 1506   LABSPEC 1.012 07/02/2017 1809   LABSPEC 1.012 05/25/2013 1506   PHURINE  07/02/2017 1809    TEST NOT REPORTED DUE TO COLOR INTERFERENCE OF URINE PIGMENT   GLUCOSEU Negative 10/16/2018 1506   GLUCOSEU Negative 05/25/2013 1506   HGBUR (A) 07/02/2017 1809    TEST NOT REPORTED DUE TO COLOR INTERFERENCE OF URINE PIGMENT   BILIRUBINUR Negative 10/16/2018 1506   BILIRUBINUR Negative 05/25/2013 1506   KETONESUR (A) 07/02/2017 1809    TEST NOT REPORTED DUE TO COLOR INTERFERENCE OF URINE PIGMENT   PROTEINUR Negative 10/16/2018 1506   PROTEINUR (A) 07/02/2017 1809    TEST NOT REPORTED DUE TO COLOR INTERFERENCE OF URINE  PIGMENT   UROBILINOGEN 0.2 10/03/2018 1032   NITRITE Positive (A) 10/16/2018 1506   NITRITE (A) 07/02/2017 1809    TEST NOT REPORTED DUE TO COLOR INTERFERENCE OF URINE PIGMENT   LEUKOCYTESUR Trace (A) 10/16/2018 1506   LEUKOCYTESUR Negative 05/25/2013 1506    Radiological Exams on Admission: Dg Chest Port 1 View  Result Date: 03/09/2019 CLINICAL DATA:  COVID-19, hypoxia EXAM: PORTABLE CHEST 1 VIEW COMPARISON:  08/14/2013 FINDINGS: Very rotated AP portable examination with probable atelectasis or scarring of the left lung base, similar in appearance to prior examination. The heart and mediastinum are unremarkable. IMPRESSION: Very rotated AP portable examination with probable atelectasis or scarring of the left lung base, similar in appearance to prior examination. No definite acute airspace opacity. Electronically Signed   By: Eddie Candle M.D.   On: 03/09/2019 19:53    EKG: Independently reviewed.  Assessment/Plan Principal Problem:   Acute hypoxemic respiratory failure due to COVID-19 [facility]) Active Problems:   Cerebral palsy (HCC)   Intellectual disability   Dependent on wheelchair   Chronic constipation   AKI (acute kidney injury) ([place])    1. Acute hypoxic resp failure due to COVID - 1. COVID pathway 2. remdesivir per pharm 3. Decadron 4. Daily labs 5. Cont pulse ox 6. tele monitor at least initially 2. CP, intellectual disability - 1. Chronic and stable 2. Cont zoloft for depression 3. Cont PRN xanax for anxiety 3. Chronic constipation - 1. Cont chronic laxitives 4. AKI / pre-renal azotemia - 1. Creat at baseline, but BUN bump noted compared to last week. 2. Will put on NS at 75 cc/hr 3. Putting in for strict intake and output, though this may be difficult as patient is chronically incontinent of urine according to his chart.  DVT prophylaxis: Lovenox Code Status: Full Family Communication: No family in room Disposition Plan: Group home after admit Consults  called: None Admission status: Admit to inpatient  Severity of Illness: The appropriate patient status for this patient is INPATIENT. Inpatient status is judged to be reasonable and necessary in order to provide the required intensity of service to ensure the patient's safety. [name] presenting symptoms, physical exam findings, and initial radiographic and laboratory data in the context of their chronic comorbidities is felt to place them at high risk for further clinical deterioration. Furthermore, it is not anticipated that  the patient will be medically stable for discharge from the hospital within 2 midnights of admission. The following factors support the patient status of inpatient.   IP status due to new O2 requirement due to COVID.  * I certify that at the point of admission it is my clinical judgment that the patient will require inpatient hospital care spanning beyond 2 midnights from the point of admission due to high intensity of service, high risk for further deterioration and high frequency of surveillance required.*    GARDNER, JARED M. DO [facility]  How to contact the [facility] Attending or Consulting provider [place] or covering provider during after hours [place], for this patient?  1. Check the care team in [facility] and look for a) attending/consulting TR[facility] provider listed and b) the [facility] team listed 2. Log into [email or web address]  Amion Physician Scheduling and messaging for groups and whole hospitals  On call and physician scheduling software for group practices, residents, hospitalists and other medical providers for call, clinic, rotation and shift schedules. On[employer] is a hospital-wide system for scheduling doctors and paging doctors on call. EasyPlot is for scientific plotting and data analysis.  [email or web address]  and use [place]'s universal password to access. If you do not have the password, please contact the hospital operator.  3. Locate the [facility] provider you  are looking for under [facility] and page to a number that you can be directly reached. 4. If you still have difficulty reaching the provider, please page the [facility] (Director on Call) for the Hospitalists listed on amion for assistance.  03/10/2019, 6:43 AM

## 2019-03-10 NOTE — ED Provider Notes (Signed)
-----------------------------------------   12:38 AM on 03/10/2019 -----------------------------------------   Blood pressure 116/79, pulse 75, temperature (!) 100.6 F (38.1 C), temperature source Rectal, resp. rate 20, height 5\' 8"  (1.727 m), weight 83 kg, SpO2 96 %.  Assuming care from Dr. Cherylann Banas of [name] is a 78 y.o. male with a chief complaint of Shortness of Breath .    Please refer to H&P by previous MD for further details.  COVID +. Acute hypoxic respiratory failure on 2L [place]. Elevated d-dimer, will start patient on lovenox per COVID protocol.  Low procalcitonin will hold off abx. Remdesivir ordered. Will discuss with [facility] for admission.     CRITICAL CARE Performed by: Rudene Re  ?  Total critical care time: 30 min  Critical care time was exclusive of separately billable procedures and treating other patients.  Critical care was necessary to treat or prevent imminent or life-threatening deterioration.  Critical care was time spent personally by me on the following activities: development of treatment plan with patient and/or surrogate as well as nursing, discussions with consultants, evaluation of patient's response to treatment, examination of patient, obtaining history from patient or surrogate, ordering and performing treatments and interventions, ordering and review of laboratory studies, ordering and review of radiographic studies, pulse oximetry and re-evaluation of patient's condition.         Rudene Re, MD 03/10/19 [phone]

## 2019-03-10 NOTE — Plan of Care (Signed)
  Problem: Education: Goal: Knowledge of risk factors and measures for prevention of condition will improve Outcome: Progressing   Problem: Coping: Goal: Psychosocial and spiritual needs will be supported Outcome: Progressing   Problem: Respiratory: Goal: Will maintain a patent airway Outcome: Progressing Goal: Complications related to the disease process, condition or treatment will be avoided or minimized Outcome: Progressing   

## 2019-03-10 NOTE — Progress Notes (Signed)
Remdesivir - Pharmacy Brief Note  78 yo M from group home, dx COVID on Friday   O:  ALT:  CXR:  SpO2: % on    A/P:  Remdesivir 200 mg IVPB once followed by 100 mg IVPB daily x 4 days.   Hart Robinsons, PharmD Clinical Pharmacist 03/10/2019  03/10/2019 12:58 AM

## 2019-03-10 NOTE — Plan of Care (Signed)
[place] doc note:  78 yo M from group home, dx COVID on Friday.  H/o cerebral palsy.  On 2L [place], low procalcitonin, elevated D.Dimer.  Got decadron and remdesivir.  Will put in for tele bed.

## 2019-03-11 DIAGNOSIS — I10 Essential (primary) hypertension: Secondary | ICD-10-CM

## 2019-03-11 DIAGNOSIS — E440 Moderate protein-calorie malnutrition: Secondary | ICD-10-CM

## 2019-03-11 LAB — COMPREHENSIVE METABOLIC PANEL
ALT: 17 U/L (ref 0–44)
AST: 36 U/L (ref 15–41)
Albumin: 3 g/dL — ABNORMAL LOW (ref 3.5–5.0)
Alkaline Phosphatase: 68 U/L (ref 38–126)
Anion gap: 9 (ref 5–15)
BUN: 52 mg/dL — ABNORMAL HIGH (ref 8–23)
CO2: 24 mmol/L (ref 22–32)
Calcium: 8.5 mg/dL — ABNORMAL LOW (ref 8.9–10.3)
Chloride: 116 mmol/L — ABNORMAL HIGH (ref 98–111)
Creatinine, Ser: 0.62 mg/dL (ref 0.61–1.24)
GFR calc Af Amer: >60 mL/min (ref >60)
GFR calc non Af Amer: >60 mL/min (ref >60)
Glucose, Bld: 127 mg/dL — ABNORMAL HIGH (ref 70–99)
Potassium: 4.1 mmol/L (ref 3.5–5.1)
Sodium: 149 mmol/L — ABNORMAL HIGH (ref 135–145)
Total Bilirubin: 1.2 mg/dL (ref 0.3–1.2)
Total Protein: 6.3 g/dL — ABNORMAL LOW (ref 6.5–8.1)

## 2019-03-11 LAB — CBC WITH DIFFERENTIAL/PLATELET
Abs Immature Granulocytes: 0.03 10*3/uL (ref 0.00–0.07)
Basophils Absolute: 0 10*3/uL (ref 0.0–0.1)
Basophils Relative: 0 %
Eosinophils Absolute: 0 10*3/uL (ref 0.0–0.5)
Eosinophils Relative: 0 %
HCT: 33.5 % — ABNORMAL LOW (ref 39.0–52.0)
Hemoglobin: 11.1 g/dL — ABNORMAL LOW (ref 13.0–17.0)
Immature Granulocytes: 1 %
Lymphocytes Relative: 7 %
Lymphs Abs: 0.4 10*3/uL — ABNORMAL LOW (ref 0.7–4.0)
MCH: 32 pg (ref 26.0–34.0)
MCHC: 33.1 g/dL (ref 30.0–36.0)
MCV: 96.5 fL (ref 80.0–100.0)
Monocytes Absolute: 0.1 10*3/uL (ref 0.1–1.0)
Monocytes Relative: 2 %
Neutro Abs: 5.5 10*3/uL (ref 1.7–7.7)
Neutrophils Relative %: 90 %
Platelets: 152 10*3/uL (ref 150–400)
RBC: 3.47 MIL/uL — ABNORMAL LOW (ref 4.22–5.81)
RDW: 13.2 % (ref 11.5–15.5)
WBC: 6.1 10*3/uL (ref 4.0–10.5)
nRBC: 0 % (ref 0.0–0.2)

## 2019-03-11 LAB — LACTATE DEHYDROGENASE: LDH: 279 U/L — ABNORMAL HIGH (ref 98–192)

## 2019-03-11 LAB — C-REACTIVE PROTEIN: CRP: 12.4 mg/dL — ABNORMAL HIGH (ref <1.0)

## 2019-03-11 LAB — MAGNESIUM: Magnesium: 2.3 mg/dL (ref 1.7–2.4)

## 2019-03-11 LAB — FERRITIN: Ferritin: 520 ng/mL — ABNORMAL HIGH (ref 24–336)

## 2019-03-11 LAB — PHOSPHORUS: Phosphorus: 2.5 mg/dL (ref 2.5–4.6)

## 2019-03-11 LAB — D-DIMER, QUANTITATIVE: D-Dimer, Quant: 1.26 ug/mL-FEU — ABNORMAL HIGH (ref 0.00–0.50)

## 2019-03-11 MED ORDER — DEXTROSE 5 % IV SOLN
INTRAVENOUS | Status: DC
  Administered 2019-03-11 – 2019-03-12 (×3): via INTRAVENOUS

## 2019-03-11 MED ORDER — CRANBERRY JUICE POWDER 425 MG PO CAPS: 1.0000 | ORAL_CAPSULE | Freq: Every day | ORAL | Status: DC

## 2019-03-11 MED ORDER — CALCIUM-VITAMIN D 500-200 MG-UNIT PO TABS
1.0000 | ORAL_TABLET | Freq: Two times a day (BID) | ORAL | Status: DC
  Administered 2019-03-11 – 2019-03-14 (×7): 1 via ORAL
  Filled 2019-03-11 (×8): qty 1

## 2019-03-11 NOTE — Progress Notes (Signed)
PROGRESS NOTE    [name]  [id number] DOB: December 13, 1940 DOA: 03/10/2019 PCP: Steele Sizer, MD   Brief Narrative:  78 y.o. WM PMHx mental retardation,.  Anxiety, cerebral palsy, HLD, nephrolithiasis, overweight, vitamin D deficiency wheelchair dependent  EMS called to group home for increased, weakness, SOB, hypoxia.  Patient diagnosed with COVID x2 days ago.  Satting low 80s on RA when EMS arrived.  Patient brought in to ED.  Due to CP, patient not really able to express specific complaints.   ED Course: Pt now satting 94% on 3L [place], Procalcitonin of 0.17, D.dimer is elevated.  WBC 10.9k.  CXR neg, COVID positive.  Got remdesivir and decadron in ED.   Subjective: 11/17 last 24 hours afebrile negative CP, negative abdominal pain.  Negative S OB.   Assessment & Plan:   Principal Problem:   Acute hypoxemic respiratory failure due to COVID-19 [facility]) Active Problems:   Cerebral palsy (HCC)   Intellectual disability   Dependent on wheelchair   Chronic constipation   Hypertension   PAD (peripheral artery disease) (HCC)   AKI (acute kidney injury) ([place])   Moderate protein malnutrition (HCC)   Moderate protein-calorie malnutrition (HCC)   Covid pneumonia/acute respiratory failure with hypoxia COVID-19 Labs  Recent Labs    03/09/19 1926 03/10/19 0750 03/11/19 0440  DDIMER  --  1.93* 1.26*  FERRITIN 500* 463* 520*  LDH  --  264* 279*  CRP  --  19.6* 12.4*    Lab Results  Component Value Date   SARSCOV2NAA POSITIVE (A) 03/09/2019  -Decadron 6 mg daily -Remdesivir per pharmacy  Cerebral palsy/mental retardation -Chronic and stable  Acute kidney injury Recent Labs  Lab 03/09/19 1926 03/10/19 0750 03/11/19 0440  CREATININE 0.85 0.67 0.62  -Resolved -Strict in and out +1.2 L -Daily weight Filed Weights   03/11/19 0400 03/11/19 0401  Weight: 66.5 kg 66.5 kg    Chronic constipation -Stable  Hypernatremia -Secondary to  dehydration -Patient not consuming food or fluids well. -D5W 27ml/hr  Moderate protein calorie malnutrition -11/17 If patient does not increase oral intake will need to discuss with guardian placement of NG tube   DVT prophylaxis: Lovenox per pharmacy Covid protocol Code Status: Full Family Communication:  Disposition Plan: TBD   Consultants:    Procedures/Significant Events:  PCXR 11/15;-very rotated AP portable examination with probable atelectasis or scarring of the left lung base, similar in appearance to prior examination. No definite acute airspace opacity    I have personally reviewed and interpreted all radiology studies and my findings are as above.  VENTILATOR SETTINGS: Nasal cannula  Flow rate; 2 L/min SPO2; 94%    Cultures 11/15 SARS coronavirus positive 11/16 MRSA by PCR negative   Antimicrobials: Anti-infectives (From admission, onward)   Start     Stop   03/11/19 1000  remdesivir 100 mg in sodium chloride 0.9 % 250 mL IVPB     03/15/19 0959   03/10/19 1000  nitrofurantoin (macrocrystal-monohydrate) (MACROBID) capsule 100 mg             Devices    LINES / TUBES:      Continuous Infusions: . sodium chloride 75 mL/hr at 03/11/19 0700  . remdesivir 100 mg in NS 250 mL 100 mg (03/11/19 0941)     Objective: Vitals:   03/11/19 0400 03/11/19 0401 03/11/19 0748 03/11/19 1610  BP: 118/72  115/81 101/62  Pulse: 64  67 60  Resp: 18  19 19   Temp: 98.4 F (36.9  C)  98.2 F (36.8 C) 98.5 F (36.9 C)  TempSrc: Axillary  Axillary Axillary  SpO2: 96%  95% 95%  Weight: 66.5 kg 66.5 kg    Height:        Intake/Output Summary (Last 24 hours) at 03/11/2019 1619 Last data filed at 03/11/2019 0700 Gross per 24 hour  Intake 1642.67 ml  Output 400 ml  Net 1242.67 ml   Filed Weights   03/11/19 0400 03/11/19 0401  Weight: 66.5 kg 66.5 kg   Physical Exam:  General: A/O x4, no acute respiratory distress, cachectic Eyes: negative scleral  hemorrhage, negative anisocoria, negative icterus ENT: Negative Runny nose, negative gingival bleeding, all mucosal/lips dry cracked   Neck:  Negative scars, masses, torticollis, lymphadenopathy, JVD Lungs: Clear to auscultation bilaterally without wheezes or crackles Cardiovascular: Regular rate and rhythm without murmur gallop or rub normal S1 and S2 Abdomen: negative abdominal pain, nondistended, positive soft, bowel sounds, no rebound, no ascites, no appreciable mass Extremities: No significant cyanosis, clubbing, or edema bilateral lower extremities Skin: Negative rashes, lesions, ulcers Psychiatric:  Negative depression, negative anxiety, negative fatigue, negative mania  Central nervous system:  Cranial nerves II through XII intact, tongue/uvula midline, all extremities muscle strength 5/5 BUE , 2/5BLE sensation intact throughout, positive dysarthria, negative expressive aphasia, negative receptive aphasia.    Data Reviewed: Care during the described time interval was provided by me .  I have reviewed this patient's available data, including medical history, events of note, physical examination, and all test results as part of my evaluation.   CBC: Recent Labs  Lab 03/09/19 1926 03/10/19 0750 03/11/19 0440  WBC 10.6* 8.0 6.1  NEUTROABS 9.5* 7.3 5.5  HGB 12.4* 12.2* 11.1*  HCT 36.5* 36.3* 33.5*  MCV 93.8 97.1 96.5  PLT 127* 133* 0000000   Basic Metabolic Panel: Recent Labs  Lab 03/09/19 1926 03/10/19 0750 03/11/19 0440  NA 145 146* 149*  K 3.8 3.6 4.1  CL 108 109 116*  CO2 23 27 24   GLUCOSE 128* 133* 127*  BUN 54* 52* 52*  CREATININE 0.85 0.67 0.62  CALCIUM 9.6 8.9 8.5*  MG  --  2.3 2.3  PHOS  --  3.8 2.5   GFR: Estimated Creatinine Clearance: 73.9 mL/min (by C-G formula based on SCr of 0.62 mg/dL). Liver Function Tests: Recent Labs  Lab 03/09/19 1926 03/10/19 0750 03/11/19 0440  AST 36 38 36  ALT 15 16 17   ALKPHOS 70 70 68  BILITOT 1.2 0.9 1.2  PROT 7.5 7.1  6.3*  ALBUMIN 3.7 3.2* 3.0*   No results for input(s): LIPASE, AMYLASE in the last 168 hours. No results for input(s): AMMONIA in the last 168 hours. Coagulation Profile: No results for input(s): INR, PROTIME in the last 168 hours. Cardiac Enzymes: No results for input(s): CKTOTAL, CKMB, CKMBINDEX, TROPONINI in the last 168 hours. BNP (last 3 results) No results for input(s): PROBNP in the last 8760 hours. HbA1C: No results for input(s): HGBA1C in the last 72 hours. CBG: No results for input(s): GLUCAP in the last 168 hours. Lipid Profile: No results for input(s): CHOL, HDL, LDLCALC, TRIG, CHOLHDL, LDLDIRECT in the last 72 hours. Thyroid Function Tests: No results for input(s): TSH, T4TOTAL, FREET4, T3FREE, THYROIDAB in the last 72 hours. Anemia Panel: Recent Labs    03/10/19 0750 03/11/19 0440  FERRITIN 463* 520*   Urine analysis:    Component Value Date/Time   COLORURINE RED (A) 07/02/2017 1809   APPEARANCEUR Cloudy (A) 10/16/2018 1506  LABSPEC 1.012 07/02/2017 1809   LABSPEC 1.012 05/25/2013 1506   PHURINE  07/02/2017 1809    TEST NOT REPORTED DUE TO COLOR INTERFERENCE OF URINE PIGMENT   GLUCOSEU Negative 10/16/2018 1506   GLUCOSEU Negative 05/25/2013 1506   HGBUR (A) 07/02/2017 1809    TEST NOT REPORTED DUE TO COLOR INTERFERENCE OF URINE PIGMENT   BILIRUBINUR Negative 10/16/2018 1506   BILIRUBINUR Negative 05/25/2013 1506   KETONESUR (A) 07/02/2017 1809    TEST NOT REPORTED DUE TO COLOR INTERFERENCE OF URINE PIGMENT   PROTEINUR Negative 10/16/2018 1506   PROTEINUR (A) 07/02/2017 1809    TEST NOT REPORTED DUE TO COLOR INTERFERENCE OF URINE PIGMENT   UROBILINOGEN 0.2 10/03/2018 1032   NITRITE Positive (A) 10/16/2018 1506   NITRITE (A) 07/02/2017 1809    TEST NOT REPORTED DUE TO COLOR INTERFERENCE OF URINE PIGMENT   LEUKOCYTESUR Trace (A) 10/16/2018 1506   LEUKOCYTESUR Negative 05/25/2013 1506   Sepsis  Labs: @LABRCNTIP (procalcitonin:4,lacticidven:4)  ) Recent Results (from the past 240 hour(s))  SARS Coronavirus 2 by RT PCR (hospital order, performed in [place] hospital lab) Nasopharyngeal Nasopharyngeal Swab     Status: Abnormal   Collection Time: 03/09/19 10:25 PM   Specimen: Nasopharyngeal Swab  Result Value Ref Range Status   SARS Coronavirus 2 POSITIVE (A) NEGATIVE Final    Comment: RESULT CALLED TO, READ BACK BY AND VERIFIED WITH: SHERRY ALLISON @029  03/10/2019 TTG (NOTE) If result is NEGATIVE SARS-CoV-2 target nucleic acids are NOT DETECTED. The SARS-CoV-2 RNA is generally detectable in upper and lower  respiratory specimens during the acute phase of infection. The lowest  concentration of SARS-CoV-2 viral copies this assay can detect is 250  copies / mL. A negative result does not preclude SARS-CoV-2 infection  and should not be used as the sole basis for treatment or other  patient management decisions.  A negative result may occur with  improper specimen collection / handling, submission of specimen other  than nasopharyngeal swab, presence of viral mutation(s) within the  areas targeted by this assay, and inadequate number of viral copies  (<250 copies / mL). A negative result must be combined with clinical  observations, patient history, and epidemiological information. If result is POSITIVE SARS-CoV-2 target nucleic acids are DETECTED. Th e SARS-CoV-2 RNA is generally detectable in upper and lower  respiratory specimens during the acute phase of infection.  Positive  results are indicative of active infection with SARS-CoV-2.  Clinical  correlation with patient history and other diagnostic information is  necessary to determine patient infection status.  Positive results do  not rule out bacterial infection or co-infection with other viruses. If result is PRESUMPTIVE POSTIVE SARS-CoV-2 nucleic acids MAY BE PRESENT.   A presumptive positive result was obtained  on the submitted specimen  and confirmed on repeat testing.  While 2019 novel coronavirus  (SARS-CoV-2) nucleic acids may be present in the submitted sample  additional confirmatory testing may be necessary for epidemiological  and / or clinical management purposes  to differentiate between  SARS-CoV-2 and other Sarbecovirus currently known to infect humans.  If clinically indicated additional testing with an alternate test  methodology [phone]) is  advised. The SARS-CoV-2 RNA is generally  detectable in upper and lower respiratory specimens during the acute  phase of infection. The expected result is Negative. Fact Sheet for Patients:  StrictlyIdeas.no Fact Sheet for Healthcare Providers: BankingDealers.co.za This test is not yet approved or cleared by the [place] FDA and has been authorized  for detection and/or diagnosis of SARS-CoV-2 by FDA under an Emergency Use Authorization (EUA).  This EUA will remain in effect (meaning this test can be used) for the duration of the COVID-19 declaration under Section 564(b)(1) of the Act, 21 U.S.C. section 360bbb-3(b)(1), unless the authorization is terminated or revoked sooner. Performed at [facility], [address] [place], [place] [address]   MRSA PCR Screening     Status: None   Collection Time: 03/10/19  6:23 AM   Specimen: Nasal Mucosa; Nasopharyngeal  Result Value Ref Range Status   MRSA by PCR NEGATIVE NEGATIVE Final    Comment:        The GeneXpert MRSA Assay (FDA approved for NASAL specimens only), is one component of a comprehensive MRSA colonization surveillance program. It is not intended to diagnose MRSA infection nor to guide or monitor treatment for MRSA infections. Performed at [facility], [place] [address] [place], [place] [address]          Radiology Studies: Dg Chest Port 1 View  Result Date: 03/09/2019 CLINICAL DATA:   COVID-19, hypoxia EXAM: PORTABLE CHEST 1 VIEW COMPARISON:  08/14/2013 FINDINGS: Very rotated AP portable examination with probable atelectasis or scarring of the left lung base, similar in appearance to prior examination. The heart and mediastinum are unremarkable. IMPRESSION: Very rotated AP portable examination with probable atelectasis or scarring of the left lung base, similar in appearance to prior examination. No definite acute airspace opacity. Electronically Signed   By: Eddie Candle M.D.   On: 03/09/2019 19:53        Scheduled Meds: . albuterol  2 puff Inhalation Q6H  . alfuzosin  10 mg Oral Q breakfast  . aspirin  81 mg Oral Daily  . atorvastatin  20 mg Oral Daily  . calcium-vitamin D  1 tablet Oral BID  . dexamethasone  6 mg Oral Q24H  . enoxaparin (LOVENOX) injection  40 mg Subcutaneous Q24H  . lubiprostone  24 mcg Oral BID WC  . nitrofurantoin (macrocrystal-monohydrate)  100 mg Oral BID  . polyethylene glycol  17 g Oral Daily  . polyvinyl alcohol  1 drop Both Eyes BID  . sertraline  100 mg Oral Daily   Continuous Infusions: . sodium chloride 75 mL/hr at 03/11/19 0700  . remdesivir 100 mg in NS 250 mL 100 mg (03/11/19 0941)     LOS: 1 day   The patient is critically ill with multiple organ systems failure and requires high complexity decision making for assessment and support, frequent evaluation and titration of therapies, application of advanced monitoring technologies and extensive interpretation of multiple databases. Critical Care Time devoted to patient care services described in this note  Time spent: 40 minutes     WOODS, Geraldo Docker, MD [facility] Pager [phone]  If 7PM-7AM, please contact night-coverage [email or web address] Password [email or web address] 03/11/2019, 4:19 PM

## 2019-03-11 NOTE — Progress Notes (Signed)
PHARMACIST - PHYSICIAN ORDER COMMUNICATION  CONCERNING: P&T Medication Policy on Herbal Medications  DESCRIPTION:  This patient's order for:  Cranberry Juice Powder Caps  has been noted.  This product(s) is classified as an "herbal" or natural product. Due to a lack of definitive safety studies or FDA approval, nonstandard manufacturing practices, plus the potential risk of unknown drug-drug interactions while on inpatient medications, the Pharmacy and Therapeutics Committee does not permit the use of "herbal" or natural products of this type within [facility].   ACTION TAKEN: The pharmacy department is unable to verify this order at this time. Please reevaluate patient's clinical condition at discharge and address if the herbal or natural product(s) should be resumed at that time.  Rober Minion, PharmD., MS Clinical Pharmacist Pager:  [phone] Thank you for allowing pharmacy to be part of this patients care team.

## 2019-03-11 NOTE — Plan of Care (Signed)

## 2019-03-12 DIAGNOSIS — J9601 Acute respiratory failure with hypoxia: Secondary | ICD-10-CM

## 2019-03-12 DIAGNOSIS — U071 COVID-19: Principal | ICD-10-CM

## 2019-03-12 DIAGNOSIS — L8990 Pressure ulcer of unspecified site, unspecified stage: Secondary | ICD-10-CM | POA: Diagnosis present

## 2019-03-12 LAB — COMPREHENSIVE METABOLIC PANEL
ALT: 18 U/L (ref 0–44)
AST: 27 U/L (ref 15–41)
Albumin: 2.9 g/dL — ABNORMAL LOW (ref 3.5–5.0)
Alkaline Phosphatase: 62 U/L (ref 38–126)
Anion gap: 9 (ref 5–15)
BUN: 46 mg/dL — ABNORMAL HIGH (ref 8–23)
CO2: 24 mmol/L (ref 22–32)
Calcium: 8.8 mg/dL — ABNORMAL LOW (ref 8.9–10.3)
Chloride: 114 mmol/L — ABNORMAL HIGH (ref 98–111)
Creatinine, Ser: 0.54 mg/dL — ABNORMAL LOW (ref 0.61–1.24)
GFR calc Af Amer: >60 mL/min (ref >60)
GFR calc non Af Amer: >60 mL/min (ref >60)
Glucose, Bld: 124 mg/dL — ABNORMAL HIGH (ref 70–99)
Potassium: 4.1 mmol/L (ref 3.5–5.1)
Sodium: 147 mmol/L — ABNORMAL HIGH (ref 135–145)
Total Bilirubin: 1 mg/dL (ref 0.3–1.2)
Total Protein: 6.1 g/dL — ABNORMAL LOW (ref 6.5–8.1)

## 2019-03-12 LAB — CBC WITH DIFFERENTIAL/PLATELET
Abs Immature Granulocytes: 0.05 10*3/uL (ref 0.00–0.07)
Basophils Absolute: 0 10*3/uL (ref 0.0–0.1)
Basophils Relative: 0 %
Eosinophils Absolute: 0 10*3/uL (ref 0.0–0.5)
Eosinophils Relative: 0 %
HCT: 34 % — ABNORMAL LOW (ref 39.0–52.0)
Hemoglobin: 11.4 g/dL — ABNORMAL LOW (ref 13.0–17.0)
Immature Granulocytes: 1 %
Lymphocytes Relative: 9 %
Lymphs Abs: 0.6 10*3/uL — ABNORMAL LOW (ref 0.7–4.0)
MCH: 32.8 pg (ref 26.0–34.0)
MCHC: 33.5 g/dL (ref 30.0–36.0)
MCV: 97.7 fL (ref 80.0–100.0)
Monocytes Absolute: 0.3 10*3/uL (ref 0.1–1.0)
Monocytes Relative: 5 %
Neutro Abs: 5.1 10*3/uL (ref 1.7–7.7)
Neutrophils Relative %: 85 %
Platelets: 144 10*3/uL — ABNORMAL LOW (ref 150–400)
RBC: 3.48 MIL/uL — ABNORMAL LOW (ref 4.22–5.81)
RDW: 13.4 % (ref 11.5–15.5)
WBC: 6 10*3/uL (ref 4.0–10.5)
nRBC: 0 % (ref 0.0–0.2)

## 2019-03-12 LAB — C-REACTIVE PROTEIN: CRP: 7.3 mg/dL — ABNORMAL HIGH (ref <1.0)

## 2019-03-12 LAB — D-DIMER, QUANTITATIVE: D-Dimer, Quant: 1.41 ug/mL-FEU — ABNORMAL HIGH (ref 0.00–0.50)

## 2019-03-12 NOTE — TOC Initial Note (Signed)
Transition of Care [facility]) - Initial/Assessment Note    Patient Details  Name: [name] MRN: [id number] Date of Birth: 10/08/1940  Transition of Care [facility]) CM/SW Contact:    Ninfa Meeker, RN Phone Number: 03/12/2019, 3:01 PM  Clinical Narrative: Patient is a 78 y.o. male with PMH of Mental retardation, cerebral palsy, came to ED with  increased weakness, shortness of breath and hypoxia.  The patient was diagnosed with COVID-19 2 days ago.  When EMS arrived, he was found to have an O2 saturation in the low 80s on room air.  The patient is unable to express specific complaints. Transferred from [place] ED to [facility] for further treatmentt. Patient resides at [facility]- Director is Shunta Brain [phone], coordinator is [phone].                     Patient Goals and CMS Choice        Expected Discharge Plan and Services: to be determined                                                Prior Living Arrangements/Services                       Activities of Daily Living Home Assistive Devices/Equipment: Gilford Rile (specify type) ADL Screening (condition at time of admission) Patient's cognitive ability adequate to safely complete daily activities?: Yes Is the patient deaf or have difficulty hearing?: No Does the patient have difficulty seeing, even when wearing glasses/contacts?: No Does the patient have difficulty concentrating, remembering, or making decisions?: Yes Patient able to express need for assistance with ADLs?: No Does the patient have difficulty dressing or bathing?: Yes Independently performs ADLs?: No Communication: Needs assistance Is this a change from baseline?: Pre-admission baseline Dressing (OT): Needs assistance Is this a change from baseline?: Pre-admission baseline Grooming: Needs assistance Is this a change from baseline?: Pre-admission baseline Feeding: Needs assistance Is  this a change from baseline?: Pre-admission baseline Bathing: Needs assistance Is this a change from baseline?: Pre-admission baseline Toileting: Needs assistance Is this a change from baseline?: Pre-admission baseline In/Out Bed: Needs assistance Is this a change from baseline?: Pre-admission baseline Walks in Home: Needs assistance Is this a change from baseline?: Pre-admission baseline Does the patient have difficulty walking or climbing stairs?: Yes Weakness of Legs: Both Weakness of Arms/Hands: Both  Permission Sought/Granted                  Emotional Assessment              Admission diagnosis:  COVID-19 [U07.1] Patient Active Problem List   Diagnosis Date Noted  . Pressure injury of skin 03/12/2019  . Moderate protein malnutrition ([place]) 03/11/2019  . Moderate protein-calorie malnutrition ([place]) 03/11/2019  . AKI (acute kidney injury) ([place]) 03/10/2019  . Acute hypoxemic respiratory failure due to COVID-19 ([place]) 03/10/2019  . Pancytopenia ([place]) 03/04/2019  . Renal mass 07/23/2017  . Ureteral calculus 07/23/2017  . PAD (peripheral artery disease) ([place]) 07/04/2017  . Aortic valve calcification 07/04/2017  . Coronary artery calcification 07/04/2017  . Hypertension 10/26/2016  . Late effect acute polio 05/17/2016  . Chronic constipation 11/19/2015  . Mild major depression ([place]) 11/19/2015  . Complicated grieving 0000000  . Recurrent UTI 09/11/2015  . Adenocarcinoma of prostate (  [place]) 03/02/2015  . Amyotrophia 03/02/2015  . Edema leg 03/02/2015  . Cerebral palsy ([place]) 03/02/2015  . Dyslipidemia 03/02/2015  . Dermatitis, eczematoid 03/02/2015  . 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 H/O acute poliomyelitis 03/02/2015  . Lymphedema 03/02/2015  . Intellectual disability 03/02/2015  . Hypertrophy of nail 03/02/2015  . Adiposity 03/02/2015  . Absence of bladder continence 03/02/2015  . Chronic venous insufficiency 03/02/2015  . Avitaminosis D 03/02/2015  .  Adynamia 03/02/2015  . Dependent on wheelchair 03/02/2015  . Kidney cysts 03/02/2015  . At risk for falling 03/02/2015  . Cerebral vascular disease 03/02/2015  . Strabismus 02/11/2015  . Urge incontinence of urine 02/11/2015  . Wheelchair dependence 02/11/2015  . Obesity 02/11/2015  . Urinary frequency 10/30/2014   PCP:  Steele Sizer, MD Pharmacy:   [facility], [place] [address] [place] [address] Phone: [phone] Fax: [phone]     Social Determinants of Health (SDOH) Interventions    Readmission Risk Interventions No flowsheet data found.

## 2019-03-12 NOTE — Progress Notes (Signed)
[name]  [id number] DOB: [birth year] DOA: 03/10/2019 PCP: Steele Sizer, MD    Brief Narrative:  78 year old with a history of cerebral palsy and mental handicap who lives in a group home and was transported to the ED with complaints of worsening shortness of breath and severe generalized weakness.  He had been diagnosed with Covid 2 days prior.  EMS found his saturations to be in the low 80s on room air.  In the ED there was no evidence of pulmonary infiltrates on the chest x-ray but the patient was confirmed to be Covid positive.  Significant Events: 11/16 admit to [facility] via [facility] ED  COVID-19 specific Treatment: Remdesivir Decadron  Subjective: Resting comfortably in bed.  In no apparent acute distress.  Does not appear to be in pain.  Is not able to provide a detailed ROS.  Assessment & Plan:  Covid pneumonia -acute hypoxic respiratory failure Has been successfully weaned to room air -continue current treatment and follow closely  Cerebral palsy -mental handicap  Chronic constipation Continue usual home treatment regimen  DVT prophylaxis: Lovenox Code Status: FULL CODE Family Communication:  Disposition Plan:   Consultants:  none  Antimicrobials:  None  Objective: Blood pressure 117/67, pulse (!) 57, temperature 98 F (36.7 C), temperature source Axillary, resp. rate 18, height 5\' 8"  (1.727 m), weight 67.1 kg, SpO2 95 %.  Intake/Output Summary (Last 24 hours) at 03/12/2019 1242 Last data filed at 03/12/2019 1019 Gross per 24 hour  Intake 1307 ml  Output 425 ml  Net 882 ml   Filed Weights   03/11/19 0400 03/11/19 0401 03/12/19 0300  Weight: 66.5 kg 66.5 kg 67.1 kg    Examination: General: No acute respiratory distress Lungs: Clear to auscultation bilaterally without wheezes or crackles Cardiovascular: Regular rate and rhythm without murmur gallop or rub normal S1 and S2 Abdomen: Nontender, nondistended, soft, bowel sounds positive, no  rebound, no ascites, no appreciable mass Extremities: No significant cyanosis, clubbing, or edema bilateral lower extremities  CBC: Recent Labs  Lab 03/10/19 0750 03/11/19 0440 03/12/19 0235  WBC 8.0 6.1 6.0  NEUTROABS 7.3 5.5 5.1  HGB 12.2* 11.1* 11.4*  HCT 36.3* 33.5* 34.0*  MCV 97.1 96.5 97.7  PLT 133* 152 [id number]*   Basic Metabolic Panel: Recent Labs  Lab 03/10/19 0750 03/11/19 0440 03/12/19 0235  NA 146* 149* 147*  K 3.6 4.1 4.1  CL 109 116* 114*  CO2 27 24 24   GLUCOSE 133* 127* 124*  BUN 52* 52* 46*  CREATININE 0.67 0.62 0.54*  CALCIUM 8.9 8.5* 8.8*  MG 2.3 2.3  --   PHOS 3.8 2.5  --    GFR: Estimated Creatinine Clearance: 74.6 mL/min (A) (by C-G formula based on SCr of 0.54 mg/dL (L)).  Liver Function Tests: Recent Labs  Lab 03/09/19 1926 03/10/19 0750 03/11/19 0440 03/12/19 0235  AST 36 38 36 27  ALT 15 16 17 18   ALKPHOS 70 70 68 62  BILITOT 1.2 0.9 1.2 1.0  PROT 7.5 7.1 6.3* 6.1*  ALBUMIN 3.7 3.2* 3.0* 2.9*    HbA1C: Hgb A1c MFr Bld  Date/Time Value Ref Range Status  11/30/2017 10:40 AM 4.9 <5.7 % of total Hgb Final    Comment:    For the purpose of screening for the presence of diabetes: . <5.7%       Consistent with the absence of diabetes 5.7-6.4%    Consistent with increased risk for diabetes             (  prediabetes) > or =6.5%  Consistent with diabetes . This assay result is consistent with a decreased risk of diabetes. . Currently, no consensus exists regarding use of hemoglobin A1c for diagnosis of diabetes in children. . According to American Diabetes Association (ADA) guidelines, hemoglobin A1c <7.0% represents optimal control in non-pregnant diabetic patients. Different metrics may apply to specific patient populations.  Standards of Medical Care in Diabetes(ADA). Marland Kitchen   10/02/2016 09:52 AM 4.7 <5.7 % Final    Comment:      For the purpose of screening for the presence of diabetes:   <5.7%       Consistent with the absence  of diabetes 5.7-6.4 %   Consistent with increased risk for diabetes (prediabetes) >=6.5 %     Consistent with diabetes   This assay result is consistent with a decreased risk of diabetes.   Currently, no consensus exists regarding use of hemoglobin A1c for diagnosis of diabetes in children.   According to American Diabetes Association (ADA) guidelines, hemoglobin A1c <7.0% represents optimal control in non-pregnant diabetic patients. Different metrics may apply to specific patient populations. Standards of Medical Care in Diabetes (ADA).        Recent Results (from the past 240 hour(s))  SARS Coronavirus 2 by RT PCR (hospital order, performed in [facility] hospital lab) Nasopharyngeal Nasopharyngeal Swab     Status: Abnormal   Collection Time: 03/09/19 10:25 PM   Specimen: Nasopharyngeal Swab  Result Value Ref Range Status   SARS Coronavirus 2 POSITIVE (A) NEGATIVE Final    Comment: RESULT CALLED TO, READ BACK BY AND VERIFIED WITH: SHERRY ALLISON @029  03/10/2019 TTG (NOTE) If result is NEGATIVE SARS-CoV-2 target nucleic acids are NOT DETECTED. The SARS-CoV-2 RNA is generally detectable in upper and lower  respiratory specimens during the acute phase of infection. The lowest  concentration of SARS-CoV-2 viral copies this assay can detect is 250  copies / mL. A negative result does not preclude SARS-CoV-2 infection  and should not be used as the sole basis for treatment or other  patient management decisions.  A negative result may occur with  improper specimen collection / handling, submission of specimen other  than nasopharyngeal swab, presence of viral mutation(s) within the  areas targeted by this assay, and inadequate number of viral copies  (<250 copies / mL). A negative result must be combined with clinical  observations, patient history, and epidemiological information. If result is POSITIVE SARS-CoV-2 target nucleic acids are DETECTED. Th e SARS-CoV-2 RNA is  generally detectable in upper and lower  respiratory specimens during the acute phase of infection.  Positive  results are indicative of active infection with SARS-CoV-2.  Clinical  correlation with patient history and other diagnostic information is  necessary to determine patient infection status.  Positive results do  not rule out bacterial infection or co-infection with other viruses. If result is PRESUMPTIVE POSTIVE SARS-CoV-2 nucleic acids MAY BE PRESENT.   A presumptive positive result was obtained on the submitted specimen  and confirmed on repeat testing.  While 2019 novel coronavirus  (SARS-CoV-2) nucleic acids may be present in the submitted sample  additional confirmatory testing may be necessary for epidemiological  and / or clinical management purposes  to differentiate between  SARS-CoV-2 and other Sarbecovirus currently known to infect humans.  If clinically indicated additional testing with an alternate test  methodology [phone]) is  advised. The SARS-CoV-2 RNA is generally  detectable in upper and lower respiratory specimens during the acute  phase  of infection. The expected result is Negative. Fact Sheet for Patients:  StrictlyIdeas.no Fact Sheet for Healthcare Providers: BankingDealers.co.za This test is not yet approved or cleared by the [place] FDA and has been authorized for detection and/or diagnosis of SARS-CoV-2 by FDA under an Emergency Use Authorization (EUA).  This EUA will remain in effect (meaning this test can be used) for the duration of the COVID-19 declaration under Section 564(b)(1) of the Act, 21 U.S.C. section 360bbb-3(b)(1), unless the authorization is terminated or revoked sooner. Performed at [facility], [address] [place], [place] [address]   MRSA PCR Screening     Status: None   Collection Time: 03/10/19  6:23 AM   Specimen: Nasal Mucosa; Nasopharyngeal  Result Value  Ref Range Status   MRSA by PCR NEGATIVE NEGATIVE Final    Comment:        The GeneXpert MRSA Assay (FDA approved for NASAL specimens only), is one component of a comprehensive MRSA colonization surveillance program. It is not intended to diagnose MRSA infection nor to guide or monitor treatment for MRSA infections. Performed at [facility], [place] [address], [place], [place] [address]      Scheduled Meds: . albuterol  2 puff Inhalation Q6H  . alfuzosin  10 mg Oral Q breakfast  . aspirin  81 mg Oral Daily  . atorvastatin  20 mg Oral Daily  . calcium-vitamin D  1 tablet Oral BID  . dexamethasone  6 mg Oral Q24H  . enoxaparin (LOVENOX) injection  40 mg Subcutaneous Q24H  . lubiprostone  24 mcg Oral BID WC  . polyethylene glycol  17 g Oral Daily  . polyvinyl alcohol  1 drop Both Eyes BID  . sertraline  100 mg Oral Daily   Continuous Infusions: . dextrose 75 mL/hr at 03/12/19 0645  . remdesivir 100 mg in NS 250 mL Stopped (03/12/19 1019)     LOS: 2 days   Cherene Altes, MD [facility] Office  [phone] Pager - Text Page per Amion  If 7PM-7AM, please contact night-coverage per Amion 03/12/2019, 12:42 PM

## 2019-03-12 NOTE — Plan of Care (Signed)

## 2019-03-12 NOTE — Progress Notes (Signed)
Family called and updated on pt's condition.  Able to have family chat with pt and have him "chat" back to them. Pt did appear more alert and interactively with them.

## 2019-03-13 LAB — COMPREHENSIVE METABOLIC PANEL
ALT: 21 U/L (ref 0–44)
AST: 31 U/L (ref 15–41)
Albumin: 2.8 g/dL — ABNORMAL LOW (ref 3.5–5.0)
Alkaline Phosphatase: 65 U/L (ref 38–126)
Anion gap: 9 (ref 5–15)
BUN: 35 mg/dL — ABNORMAL HIGH (ref 8–23)
CO2: 26 mmol/L (ref 22–32)
Calcium: 8.2 mg/dL — ABNORMAL LOW (ref 8.9–10.3)
Chloride: 107 mmol/L (ref 98–111)
Creatinine, Ser: 0.6 mg/dL — ABNORMAL LOW (ref 0.61–1.24)
GFR calc Af Amer: >60 mL/min (ref >60)
GFR calc non Af Amer: >60 mL/min (ref >60)
Glucose, Bld: 99 mg/dL (ref 70–99)
Potassium: 3.3 mmol/L — ABNORMAL LOW (ref 3.5–5.1)
Sodium: 142 mmol/L (ref 135–145)
Total Bilirubin: 0.9 mg/dL (ref 0.3–1.2)
Total Protein: 5.8 g/dL — ABNORMAL LOW (ref 6.5–8.1)

## 2019-03-13 LAB — CBC WITH DIFFERENTIAL/PLATELET
Abs Immature Granulocytes: 0.05 10*3/uL (ref 0.00–0.07)
Basophils Absolute: 0 10*3/uL (ref 0.0–0.1)
Basophils Relative: 0 %
Eosinophils Absolute: 0 10*3/uL (ref 0.0–0.5)
Eosinophils Relative: 0 %
HCT: 35.2 % — ABNORMAL LOW (ref 39.0–52.0)
Hemoglobin: 11.7 g/dL — ABNORMAL LOW (ref 13.0–17.0)
Immature Granulocytes: 1 %
Lymphocytes Relative: 19 %
Lymphs Abs: 1 10*3/uL (ref 0.7–4.0)
MCH: 32.5 pg (ref 26.0–34.0)
MCHC: 33.2 g/dL (ref 30.0–36.0)
MCV: 97.8 fL (ref 80.0–100.0)
Monocytes Absolute: 0.3 10*3/uL (ref 0.1–1.0)
Monocytes Relative: 6 %
Neutro Abs: 3.8 10*3/uL (ref 1.7–7.7)
Neutrophils Relative %: 74 %
Platelets: 157 10*3/uL (ref 150–400)
RBC: 3.6 MIL/uL — ABNORMAL LOW (ref 4.22–5.81)
RDW: 13.2 % (ref 11.5–15.5)
WBC: 5.2 10*3/uL (ref 4.0–10.5)
nRBC: 0 % (ref 0.0–0.2)

## 2019-03-13 LAB — D-DIMER, QUANTITATIVE: D-Dimer, Quant: 1.23 ug/mL-FEU — ABNORMAL HIGH (ref 0.00–0.50)

## 2019-03-13 LAB — C-REACTIVE PROTEIN: CRP: 3.7 mg/dL — ABNORMAL HIGH (ref <1.0)

## 2019-03-13 MED ORDER — POTASSIUM CHLORIDE CRYS ER 20 MEQ PO TBCR
40.0000 meq | EXTENDED_RELEASE_TABLET | Freq: Once | ORAL | Status: AC
  Administered 2019-03-13: 40 meq via ORAL
  Filled 2019-03-13: qty 2

## 2019-03-13 NOTE — Progress Notes (Signed)
[name]  [id number] DOB: 31-Oct-1940 DOA: 03/10/2019 PCP: Steele Sizer, MD    Brief Narrative:  78 year old with a history of cerebral palsy and mental handicap who lives in a group home and was transported to the ED with complaints of worsening shortness of breath and severe generalized weakness.  He had been diagnosed with Covid 2 days prior.  EMS found his saturations to be in the low 80s on room air.  In the ED there was no evidence of pulmonary infiltrates on the chest x-ray but the patient was confirmed to be Covid positive.  Significant Events: 11/16 admit to [facility] via [facility] ED  COVID-19 specific Treatment: Remdesivir 11/16 > Decadron 11/16 >  Subjective: Has been weaned to room air.  Is resting comfortably in bed.  Unable to provide a detailed ROS due to mental handicap.  Assessment & Plan:  Covid pneumonia -acute hypoxic respiratory failure Has been successfully weaned to room air -continue current treatment and follow closely -anticipate discharge after final dose of remdesivir 11/20  Cerebral palsy -mental handicap  Hypokalemia  Replace and follow -likely due to poor oral intake   Chronic constipation Continue usual home treatment regimen  DVT prophylaxis: Lovenox Code Status: FULL CODE Family Communication:  Disposition Plan: Anticipate discharge 11/20  Consultants:  none  Antimicrobials:  None  Objective: Blood pressure 125/84, pulse (!) 54, temperature 98.7 F (37.1 C), temperature source Axillary, resp. rate 15, height 5\' 8"  (1.727 m), weight 67.1 kg, SpO2 99 %.  Intake/Output Summary (Last 24 hours) at 03/13/2019 1011 Last data filed at 03/13/2019 0900 Gross per 24 hour  Intake 1310 ml  Output 800 ml  Net 510 ml   Filed Weights   03/11/19 0400 03/11/19 0401 03/12/19 0300  Weight: 66.5 kg 66.5 kg 67.1 kg    Examination: General: No acute respiratory distress Lungs: Clear to auscultation bilaterally -no  wheezing Cardiovascular: RRR without murmur Abdomen: NT/ND, soft, BS positive, no rebound Extremities: No edema bilateral lower extremities  CBC: Recent Labs  Lab 03/11/19 0440 03/12/19 0235 03/13/19 0241  WBC 6.1 6.0 5.2  NEUTROABS 5.5 5.1 3.8  HGB 11.1* 11.4* 11.7*  HCT 33.5* 34.0* 35.2*  MCV 96.5 97.7 97.8  PLT 152 144* [id number]   Basic Metabolic Panel: Recent Labs  Lab 03/10/19 0750 03/11/19 0440 03/12/19 0235 03/13/19 0241  NA 146* 149* 147* 142  K 3.6 4.1 4.1 3.3*  CL 109 116* 114* 107  CO2 27 24 24 26   GLUCOSE 133* 127* 124* 99  BUN 52* 52* 46* 35*  CREATININE 0.67 0.62 0.54* 0.60*  CALCIUM 8.9 8.5* 8.8* 8.2*  MG 2.3 2.3  --   --   PHOS 3.8 2.5  --   --    GFR: Estimated Creatinine Clearance: 74.6 mL/min (A) (by C-G formula based on SCr of 0.6 mg/dL (L)).  Liver Function Tests: Recent Labs  Lab 03/10/19 0750 03/11/19 0440 03/12/19 0235 03/13/19 0241  AST 38 36 27 31  ALT 16 17 18 21   ALKPHOS 70 68 62 65  BILITOT 0.9 1.2 1.0 0.9  PROT 7.1 6.3* 6.1* 5.8*  ALBUMIN 3.2* 3.0* 2.9* 2.8*    HbA1C: Hgb A1c MFr Bld  Date/Time Value Ref Range Status  11/30/2017 10:40 AM 4.9 <5.7 % of total Hgb Final    Comment:    For the purpose of screening for the presence of diabetes: . <5.7%       Consistent with the absence of diabetes 5.7-6.4%  Consistent with increased risk for diabetes             (prediabetes) > or =6.5%  Consistent with diabetes . This assay result is consistent with a decreased risk of diabetes. . Currently, no consensus exists regarding use of hemoglobin A1c for diagnosis of diabetes in children. . According to American Diabetes Association (ADA) guidelines, hemoglobin A1c <7.0% represents optimal control in non-pregnant diabetic patients. Different metrics may apply to specific patient populations.  Standards of Medical Care in Diabetes(ADA). Marland Kitchen   10/02/2016 09:52 AM 4.7 <5.7 % Final    Comment:      For the purpose of  screening for the presence of diabetes:   <5.7%       Consistent with the absence of diabetes 5.7-6.4 %   Consistent with increased risk for diabetes (prediabetes) >=6.5 %     Consistent with diabetes   This assay result is consistent with a decreased risk of diabetes.   Currently, no consensus exists regarding use of hemoglobin A1c for diagnosis of diabetes in children.   According to American Diabetes Association (ADA) guidelines, hemoglobin A1c <7.0% represents optimal control in non-pregnant diabetic patients. Different metrics may apply to specific patient populations. Standards of Medical Care in Diabetes (ADA).        Recent Results (from the past 240 hour(s))  SARS Coronavirus 2 by RT PCR (hospital order, performed in [facility] hospital lab) Nasopharyngeal Nasopharyngeal Swab     Status: Abnormal   Collection Time: 03/09/19 10:25 PM   Specimen: Nasopharyngeal Swab  Result Value Ref Range Status   SARS Coronavirus 2 POSITIVE (A) NEGATIVE Final    Comment: RESULT CALLED TO, READ BACK BY AND VERIFIED WITH: SHERRY ALLISON @029  03/10/2019 TTG (NOTE) If result is NEGATIVE SARS-CoV-2 target nucleic acids are NOT DETECTED. The SARS-CoV-2 RNA is generally detectable in upper and lower  respiratory specimens during the acute phase of infection. The lowest  concentration of SARS-CoV-2 viral copies this assay can detect is 250  copies / mL. A negative result does not preclude SARS-CoV-2 infection  and should not be used as the sole basis for treatment or other  patient management decisions.  A negative result may occur with  improper specimen collection / handling, submission of specimen other  than nasopharyngeal swab, presence of viral mutation(s) within the  areas targeted by this assay, and inadequate number of viral copies  (<250 copies / mL). A negative result must be combined with clinical  observations, patient history, and epidemiological information. If result is  POSITIVE SARS-CoV-2 target nucleic acids are DETECTED. Th e SARS-CoV-2 RNA is generally detectable in upper and lower  respiratory specimens during the acute phase of infection.  Positive  results are indicative of active infection with SARS-CoV-2.  Clinical  correlation with patient history and other diagnostic information is  necessary to determine patient infection status.  Positive results do  not rule out bacterial infection or co-infection with other viruses. If result is PRESUMPTIVE POSTIVE SARS-CoV-2 nucleic acids MAY BE PRESENT.   A presumptive positive result was obtained on the submitted specimen  and confirmed on repeat testing.  While 2019 novel coronavirus  (SARS-CoV-2) nucleic acids may be present in the submitted sample  additional confirmatory testing may be necessary for epidemiological  and / or clinical management purposes  to differentiate between  SARS-CoV-2 and other Sarbecovirus currently known to infect humans.  If clinically indicated additional testing with an alternate test  methodology [phone]) is  advised.  The SARS-CoV-2 RNA is generally  detectable in upper and lower respiratory specimens during the acute  phase of infection. The expected result is Negative. Fact Sheet for Patients:  StrictlyIdeas.no Fact Sheet for Healthcare Providers: BankingDealers.co.za This test is not yet approved or cleared by the [place] FDA and has been authorized for detection and/or diagnosis of SARS-CoV-2 by FDA under an Emergency Use Authorization (EUA).  This EUA will remain in effect (meaning this test can be used) for the duration of the COVID-19 declaration under Section 564(b)(1) of the Act, 21 U.S.C. section 360bbb-3(b)(1), unless the authorization is terminated or revoked sooner. Performed at [facility], [address] [place], [place] [address]   MRSA PCR Screening     Status: None   Collection  Time: 03/10/19  6:23 AM   Specimen: Nasal Mucosa; Nasopharyngeal  Result Value Ref Range Status   MRSA by PCR NEGATIVE NEGATIVE Final    Comment:        The GeneXpert MRSA Assay (FDA approved for NASAL specimens only), is one component of a comprehensive MRSA colonization surveillance program. It is not intended to diagnose MRSA infection nor to guide or monitor treatment for MRSA infections. Performed at [facility], [place] [address] [place], [place] [address]      Scheduled Meds: . albuterol  2 puff Inhalation Q6H  . alfuzosin  10 mg Oral Q breakfast  . aspirin  81 mg Oral Daily  . atorvastatin  20 mg Oral Daily  . calcium-vitamin D  1 tablet Oral BID  . enoxaparin (LOVENOX) injection  40 mg Subcutaneous Q24H  . lubiprostone  24 mcg Oral BID WC  . polyethylene glycol  17 g Oral Daily  . polyvinyl alcohol  1 drop Both Eyes BID  . sertraline  100 mg Oral Daily   Continuous Infusions: . dextrose 75 mL/hr at 03/12/19 1742  . remdesivir 100 mg in NS 250 mL 100 mg (03/13/19 0855)     LOS: 3 days   Cherene Altes, MD [facility] Office  [phone] Pager - Text Page per Amion  If 7PM-7AM, please contact night-coverage per Amion 03/13/2019, 10:11 AM

## 2019-03-13 NOTE — Progress Notes (Signed)
Patient has one more dose of remdesivir. MD put orders to be DC tomorrow before 1400.

## 2019-03-14 LAB — CBC WITH DIFFERENTIAL/PLATELET
Abs Immature Granulocytes: 0.06 10*3/uL (ref 0.00–0.07)
Basophils Absolute: 0 10*3/uL (ref 0.0–0.1)
Basophils Relative: 0 %
Eosinophils Absolute: 0 10*3/uL (ref 0.0–0.5)
Eosinophils Relative: 1 %
HCT: 34.3 % — ABNORMAL LOW (ref 39.0–52.0)
Hemoglobin: 11.3 g/dL — ABNORMAL LOW (ref 13.0–17.0)
Immature Granulocytes: 1 %
Lymphocytes Relative: 14 %
Lymphs Abs: 0.8 10*3/uL (ref 0.7–4.0)
MCH: 31.9 pg (ref 26.0–34.0)
MCHC: 32.9 g/dL (ref 30.0–36.0)
MCV: 96.9 fL (ref 80.0–100.0)
Monocytes Absolute: 0.4 10*3/uL (ref 0.1–1.0)
Monocytes Relative: 7 %
Neutro Abs: 4.5 10*3/uL (ref 1.7–7.7)
Neutrophils Relative %: 77 %
Platelets: 169 10*3/uL (ref 150–400)
RBC: 3.54 MIL/uL — ABNORMAL LOW (ref 4.22–5.81)
RDW: 13.1 % (ref 11.5–15.5)
WBC: 5.8 10*3/uL (ref 4.0–10.5)
nRBC: 0 % (ref 0.0–0.2)

## 2019-03-14 LAB — COMPREHENSIVE METABOLIC PANEL
ALT: 25 U/L (ref 0–44)
AST: 30 U/L (ref 15–41)
Albumin: 2.7 g/dL — ABNORMAL LOW (ref 3.5–5.0)
Alkaline Phosphatase: 78 U/L (ref 38–126)
Anion gap: 7 (ref 5–15)
BUN: 30 mg/dL — ABNORMAL HIGH (ref 8–23)
CO2: 28 mmol/L (ref 22–32)
Calcium: 8.6 mg/dL — ABNORMAL LOW (ref 8.9–10.3)
Chloride: 111 mmol/L (ref 98–111)
Creatinine, Ser: 0.51 mg/dL — ABNORMAL LOW (ref 0.61–1.24)
GFR calc Af Amer: >60 mL/min (ref >60)
GFR calc non Af Amer: >60 mL/min (ref >60)
Glucose, Bld: 94 mg/dL (ref 70–99)
Potassium: 4.1 mmol/L (ref 3.5–5.1)
Sodium: 146 mmol/L — ABNORMAL HIGH (ref 135–145)
Total Bilirubin: 0.6 mg/dL (ref 0.3–1.2)
Total Protein: 5.8 g/dL — ABNORMAL LOW (ref 6.5–8.1)

## 2019-03-14 LAB — C-REACTIVE PROTEIN: CRP: 5.3 mg/dL — ABNORMAL HIGH (ref <1.0)

## 2019-03-14 LAB — D-DIMER, QUANTITATIVE: D-Dimer, Quant: 1.27 ug/mL-FEU — ABNORMAL HIGH (ref 0.00–0.50)

## 2019-03-14 MED ORDER — ACETAMINOPHEN 325 MG PO TABS: 650.0000 mg | ORAL_TABLET | Freq: Four times a day (QID) | ORAL | Status: DC | PRN

## 2019-03-14 NOTE — NC FL2 (Signed)
[place] LEVEL OF CARE SCREENING TOOL     IDENTIFICATION  Patient Name: [name] Birthdate: 17-Feb-1941 Sex: male Admission Date (Current Location): 03/10/2019  [facility] [place] Number:  Herbalist and Address:  [facility], [place] [address], [place], [place] [address]([address]      Provider Number: [id number]  Attending Physician Name and Address:  Cherene Altes, MD  Relative Name and Phone Number:       Current Level of Care: Hospital Recommended Level of Care: Other (Comment)([facility]) Prior Approval Number:    Date Approved/Denied:   PASRR Number:    Discharge Plan: ([place])    Current Diagnoses: Patient Active Problem List   Diagnosis Date Noted  . Pressure injury of skin 03/12/2019  . Moderate protein malnutrition ([place]) 03/11/2019  . Pancytopenia ([place]) 03/04/2019  . Renal mass 07/23/2017  . Ureteral calculus 07/23/2017  . PAD (peripheral artery disease) ([place]) 07/04/2017  . Aortic valve calcification 07/04/2017  . Coronary artery calcification 07/04/2017  . Hypertension 10/26/2016  . Late effect acute polio 05/17/2016  . Chronic constipation 11/19/2015  . Mild major depression ([place]) 11/19/2015  . Complicated grieving 0000000  . Recurrent UTI 09/11/2015  . Adenocarcinoma of prostate ([place]) 03/02/2015  . Amyotrophia 03/02/2015  . Edema leg 03/02/2015  . Cerebral palsy ([place]) 03/02/2015  . Dyslipidemia 03/02/2015  . Dermatitis, eczematoid 03/02/2015  . 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 H/O acute poliomyelitis 03/02/2015  . Lymphedema 03/02/2015  . Intellectual disability 03/02/2015  . Hypertrophy of nail 03/02/2015  . Adiposity 03/02/2015  . Absence of bladder continence 03/02/2015  . Chronic venous insufficiency 03/02/2015  . Avitaminosis D 03/02/2015  . Adynamia 03/02/2015  . Dependent on wheelchair 03/02/2015  . Kidney cysts 03/02/2015  . At risk  for falling 03/02/2015  . Cerebral vascular disease 03/02/2015  . Strabismus 02/11/2015  . Urge incontinence of urine 02/11/2015  . Wheelchair dependence 02/11/2015  . Obesity 02/11/2015  . Urinary frequency 10/30/2014    Orientation RESPIRATION BLADDER Height & Weight     Self  Normal Incontinent Weight: 67.1 kg Height:  5\' 8"  (172.7 cm)  BEHAVIORAL SYMPTOMS/MOOD NEUROLOGICAL BOWEL NUTRITION STATUS      Incontinent Diet  AMBULATORY STATUS COMMUNICATION OF NEEDS Skin   Extensive Assist   Normal                       Personal Care Assistance Level of Assistance  Total care       Total Care Assistance: Maximum assistance   Functional Limitations Info             SPECIAL CARE FACTORS FREQUENCY  PT (By licensed PT), OT (By licensed OT)     PT Frequency: 5x/week OT Frequency: min 3x/week            Contractures      Additional Factors Info  Code Status Code Status Info: full             Current Medications (03/14/2019):  This is the current hospital active medication list Current Facility-Administered Medications  Medication Dose Route Frequency Provider Last Rate Last Dose  . acetaminophen (TYLENOL) tablet 650 mg  650 mg Oral Q6H PRN Etta Quill, DO      . albuterol (VENTOLIN HFA) 108 (90 Base) MCG/ACT inhaler 2 puff  2 puff Inhalation Q6H Etta Quill, DO   2 puff at 03/14/19  [id number]  . alfuzosin (UROXATRAL) 24 hr tablet 10 mg  10 mg Oral Q breakfast Etta Quill, DO   10 mg at 03/14/19 [id number]  . ALPRAZolam Duanne Moron) tablet 0.25 mg  0.25 mg Oral BID PRN Etta Quill, DO      . aspirin EC tablet 81 mg  81 mg Oral Daily Jennette Kettle M, DO   81 mg at 03/14/19 0808  . atorvastatin (LIPITOR) tablet 20 mg  20 mg Oral Daily Jennette Kettle M, DO   20 mg at 03/14/19 [id number]  . calcium-vitamin D 500-200 MG-UNIT per tablet 1 tablet  1 tablet Oral BID Allie Bossier, MD   1 tablet at 03/14/19 1022  . chlorpheniramine-HYDROcodone (TUSSIONEX) 10-8 MG/5ML  suspension 5 mL  5 mL Oral Q12H PRN Etta Quill, DO      . enoxaparin (LOVENOX) injection 40 mg  40 mg Subcutaneous Q24H Jennette Kettle M, DO   40 mg at 03/13/19 2242  . guaiFENesin-dextromethorphan (ROBITUSSIN DM) 100-10 MG/5ML syrup 10 mL  10 mL Oral Q4H PRN Etta Quill, DO      . lubiprostone (AMITIZA) capsule 24 mcg  24 mcg Oral BID WC Etta Quill, DO   24 mcg at 03/14/19 0808  . ondansetron (ZOFRAN) tablet 4 mg  4 mg Oral Q6H PRN Etta Quill, DO       Or  . ondansetron [facility]) injection 4 mg  4 mg Intravenous Q6H PRN Etta Quill, DO      . polyethylene glycol (MIRALAX / GLYCOLAX) packet 17 g  17 g Oral Daily Jennette Kettle M, DO   17 g at 03/14/19 0809  . polyvinyl alcohol (LIQUIFILM TEARS) 1.4 % ophthalmic solution 1 drop  1 drop Both Eyes BID Etta Quill, DO   1 drop at 03/14/19 0809  . sertraline (ZOLOFT) tablet 100 mg  100 mg Oral Daily Jennette Kettle M, DO   100 mg at 03/14/19 0808     Discharge Medications: Please see discharge summary for a list of discharge medications.  Relevant Imaging Results:  Relevant Lab Results:   Additional Information [phone]  Ninfa Meeker, RN

## 2019-03-14 NOTE — TOC Transition Note (Addendum)
Transition of Care [facility]) - CM/SW Discharge Note   Patient Details  Name: [name] MRN: [id number] Date of Birth: Sep 03, 1940  [facility]) CM/SW Contact:  Ninfa Meeker, RN Phone Number: [phone] (working remotely) 03/14/2019, 11:57 AM    Clinical Narrative:  Patient will DC to: [facility] Anticipated DC date: 03/14/19 Family notified: Group Sports coach by: Corey Harold 2:00pm  Patient is medically ready to discharge back to his group home. Charge nurse, bedside RN, Architectural technologist are aware of discharge plan. Bedside RN to call report to Butch Penny at: [phone].  Discharge Summary, FL2 will be faxed to Lottie Rater, RN [phone]. Butch Penny states she has arranged for hospital bed and hoyer lift for patient. Ambulance transport has been requested.    Final next level of care: Group Home Barriers to Discharge: No Barriers Identified   Patient Goals and CMS Choice        Discharge Placement: returning to group home                       Discharge Plan and Services   Discharge Planning Services: CM Consult                                 Social Determinants of Health (SDOH) Interventions     Readmission Risk Interventions No flowsheet data found.

## 2019-03-14 NOTE — Progress Notes (Signed)
There is not change in patient. Pt getting last does of remdesivir. Spoke to [employer] LCSW and she will reach out to the group home where the pt is from for updates. Group home stated that it will not be till later that they will be able to receive the patient.

## 2019-03-14 NOTE — Discharge Instructions (Signed)
Your positive COVID test was on 03/09/2019. Given that you disease was considered a mild/moderate case, you will need to remain on quarantine at your group home for 14 days. You will be free to leave quarantine on 03/23/2019.

## 2019-03-14 NOTE — Discharge Summary (Addendum)
DISCHARGE SUMMARY  [name]  MR#: [id number]  DOB:Feb 18, 1941  Date of Admission: 03/10/2019 Date of Discharge: 03/14/2019  Attending Physician:Giovannie Scerbo Hennie Duos, MD  Patient's CB:[id number], Kevin Stager, MD  Consults: none   Disposition: D/C to his usual group home   Follow-up Appts: Follow-up Information    Steele Sizer, MD Follow up in 7 day(s).   Specialty: Family Medicine Contact information: [address] [place] [address] [phone]           Discharge Diagnoses: Covid pneumonia Acute hypoxic respiratory failure Cerebral palsy -mental handicap Hypokalemia  Chronic constipation  COVID-19 specific Treatment: Remdesivir 11/16 > 11/20 Decadron 11/16 > 11/18  Initial presentation: 78 year old with a history of cerebral palsy and mental handicap who lives in a group home and was transported to the ED with complaints of worsening shortness of breath and severe generalized weakness.  He had been diagnosed with Covid 2 days prior.  EMS found his saturations to be in the low 80s on room air.  In the ED there was no evidence of pulmonary infiltrates on the chest x-ray but the patient was confirmed to be Covid positive.  Hospital Course:  Covid pneumonia -acute hypoxic respiratory failure No specific infiltrate noted on CXR at time of admit - successfully weaned to room air very shortly after his admission -has completed a 5-day course of remdesivir -given his rapid clinical improvement and persisting stability without hypoxia Decadron dosing was discontinued 11/18  Cerebral palsy -mental handicap Patient is quite pleasant on interaction and was stable throughout his hospital stay  Hypokalemia  Felt to be due to poor oral intake -resolved with supplementation and improved intake  Chronic constipation Continued usual home treatment regimen  Allergies as of 03/14/2019   No Known Allergies     Medication List    STOP taking these  medications   nitrofurantoin (macrocrystal-monohydrate) 100 MG capsule Commonly known as: Macrobid     TAKE these medications   acetaminophen 325 MG tablet Commonly known as: TYLENOL Take 2 tablets (650 mg total) by mouth every 6 (six) hours as needed for mild pain or headache (fever >/= 101).   alfuzosin 10 MG 24 hr tablet Commonly known as: UROXATRAL TAKE 1 TABLET (10MG ) BY MOUTH ONCE DAILY WITH BREAKFAST*DO NOT CRUSH* What changed: See the new instructions.   ALPRAZolam 0.25 MG tablet Commonly known as: XANAX TAKE ONE TABLET BY MOUTH 2 TIMES A DAY AS NEEDED FOR ANXIETY (WHEN BLOOD PRESSURE IS VERY HIGH OR ANXIOUS) What changed:   how much to take  how to take this  when to take this  reasons to take this   Amitiza 24 MCG capsule Generic drug: lubiprostone TAKE ONE CAPSULE BY MOUTH TWO TIMES A DAY WITH A MEAL What changed: See the new instructions.   ARTIFICIAL TEAR SOLUTION OP Apply 2 drops to eye 2 (two) times daily as needed (dry eyes).   aspirin 81 MG EC tablet Commonly known as: Aspirin Adult Low Strength TAKE ONE TABLET BY MOUTH EVERY DAY *DO NOT CRUSH* What changed:   how much to take  how to take this  when to take this   atorvastatin 20 MG tablet Commonly known as: LIPITOR Take 1 tablet (20 mg total) by mouth daily. What changed:   how much to take  when to take this   augmented betamethasone dipropionate 0.05 % cream Commonly known as: DIPROLENE-AF Apply 1 application topically daily as needed (rash).   Calcium Carb-Cholecalciferol 500-400 MG-UNIT Tabs TAKE  ONE TABLET BY MOUTH 2 TIMES A DAY What changed:   how much to take  how to take this  when to take this   Cranberry Juice Powder 425 MG Caps TAKE ONE CAPSULE BY MOUTH EVERY DAY   Depend Silhouette Briefs L/XL Misc 1 each by Does not apply route 2 (two) times daily as needed.   Medical Compression Stockings Misc 1 Units by Does not apply route daily.   polyethylene glycol 17  g packet Commonly known as: MIRALAX / GLYCOLAX Take 17 g by mouth daily. With 4 oz of juice or water   sertraline 100 MG tablet Commonly known as: ZOLOFT TAKE 1 TABLET BY MOUTH ONCE DAILY   Systane Ultra 0.4-0.3 % Soln Generic drug: Polyethyl Glycol-Propyl Glycol Apply 1 drop to eye 2 (two) times daily. Both eyes   vitamin C 500 MG tablet Commonly known as: ASCORBIC ACID TAKE ONE TABLET BY MOUTH TWO TIMES A DAY       Day of Discharge BP 116/73 (BP Location: Right Arm)   Pulse 66   Temp 97.7 F (36.5 C) (Axillary)   Resp 20   Ht 5\' 8"  (1.727 m)   Wt 67.1 kg   SpO2 95%   BMI 22.49 kg/m   Physical Exam: General: No acute respiratory distress Lungs: Clear to auscultation bilaterally without wheezes or crackles Cardiovascular: Regular rate and rhythm without murmur gallop or rub normal S1 and S2 Abdomen: Nontender, nondistended, soft, bowel sounds positive, no rebound, no ascites, no appreciable mass Extremities: No significant cyanosis, clubbing, or edema bilateral lower extremities  Basic Metabolic Panel: Recent Labs  Lab 03/10/19 0750 03/11/19 0440 03/12/19 0235 03/13/19 0241 03/14/19 0012  NA 146* 149* 147* 142 146*  K 3.6 4.1 4.1 3.3* 4.1  CL 109 116* 114* 107 111  CO2 27 24 24 26 28   GLUCOSE 133* 127* 124* 99 94  BUN 52* 52* 46* 35* 30*  CREATININE 0.67 0.62 0.54* 0.60* 0.51*  CALCIUM 8.9 8.5* 8.8* 8.2* 8.6*  MG 2.3 2.3  --   --   --   PHOS 3.8 2.5  --   --   --     Liver Function Tests: Recent Labs  Lab 03/10/19 0750 03/11/19 0440 03/12/19 0235 03/13/19 0241 03/14/19 0012  AST 38 36 27 31 30   ALT 16 17 18 21 25   ALKPHOS 70 68 62 65 78  BILITOT 0.9 1.2 1.0 0.9 0.6  PROT 7.1 6.3* 6.1* 5.8* 5.8*  ALBUMIN 3.2* 3.0* 2.9* 2.8* 2.7*   CBC: Recent Labs  Lab 03/10/19 0750 03/11/19 0440 03/12/19 0235 03/13/19 0241 03/14/19 0012  WBC 8.0 6.1 6.0 5.2 5.8  NEUTROABS 7.3 5.5 5.1 3.8 4.5  HGB 12.2* 11.1* 11.4* 11.7* 11.3*  HCT 36.3* 33.5* 34.0*  35.2* 34.3*  MCV 97.1 96.5 97.7 97.8 96.9  PLT 133* 152 144* 157 169    Recent Results (from the past 240 hour(s))  SARS Coronavirus 2 by RT PCR (hospital order, performed in [place] hospital lab) Nasopharyngeal Nasopharyngeal Swab     Status: Abnormal   Collection Time: 03/09/19 10:25 PM   Specimen: Nasopharyngeal Swab  Result Value Ref Range Status   SARS Coronavirus 2 POSITIVE (A) NEGATIVE Final    Comment: RESULT CALLED TO, READ BACK BY AND VERIFIED WITH: SHERRY ALLISON @029  03/10/2019 TTG (NOTE) If result is NEGATIVE SARS-CoV-2 target nucleic acids are NOT DETECTED. The SARS-CoV-2 RNA is generally detectable in upper and lower  respiratory specimens during the acute  phase of infection. The lowest  concentration of SARS-CoV-2 viral copies this assay can detect is 250  copies / mL. A negative result does not preclude SARS-CoV-2 infection  and should not be used as the sole basis for treatment or other  patient management decisions.  A negative result may occur with  improper specimen collection / handling, submission of specimen other  than nasopharyngeal swab, presence of viral mutation(s) within the  areas targeted by this assay, and inadequate number of viral copies  (<250 copies / mL). A negative result must be combined with clinical  observations, patient history, and epidemiological information. If result is POSITIVE SARS-CoV-2 target nucleic acids are DETECTED. Th e SARS-CoV-2 RNA is generally detectable in upper and lower  respiratory specimens during the acute phase of infection.  Positive  results are indicative of active infection with SARS-CoV-2.  Clinical  correlation with patient history and other diagnostic information is  necessary to determine patient infection status.  Positive results do  not rule out bacterial infection or co-infection with other viruses. If result is PRESUMPTIVE POSTIVE SARS-CoV-2 nucleic acids MAY BE PRESENT.   A presumptive positive  result was obtained on the submitted specimen  and confirmed on repeat testing.  While 2019 novel coronavirus  (SARS-CoV-2) nucleic acids may be present in the submitted sample  additional confirmatory testing may be necessary for epidemiological  and / or clinical management purposes  to differentiate between  SARS-CoV-2 and other Sarbecovirus currently known to infect humans.  If clinically indicated additional testing with an alternate test  methodology [phone]) is  advised. The SARS-CoV-2 RNA is generally  detectable in upper and lower respiratory specimens during the acute  phase of infection. The expected result is Negative. Fact Sheet for Patients:  StrictlyIdeas.no Fact Sheet for Healthcare Providers: BankingDealers.co.za This test is not yet approved or cleared by the [place] FDA and has been authorized for detection and/or diagnosis of SARS-CoV-2 by FDA under an Emergency Use Authorization (EUA).  This EUA will remain in effect (meaning this test can be used) for the duration of the COVID-19 declaration under Section 564(b)(1) of the Act, 21 U.S.C. section 360bbb-3(b)(1), unless the authorization is terminated or revoked sooner. Performed at [facility], [address] [place], [place] [address]   MRSA PCR Screening     Status: None   Collection Time: 03/10/19  6:23 AM   Specimen: Nasal Mucosa; Nasopharyngeal  Result Value Ref Range Status   MRSA by PCR NEGATIVE NEGATIVE Final    Comment:        The GeneXpert MRSA Assay (FDA approved for NASAL specimens only), is one component of a comprehensive MRSA colonization surveillance program. It is not intended to diagnose MRSA infection nor to guide or monitor treatment for MRSA infections. Performed at [facility], [place] [address] [place], [place] [address]      Time spent in discharge (includes decision making & examination of  pt): 30 minutes  03/14/2019, 11:17 AM   Cherene Altes, MD [facility] Office  [phone]

## 2019-03-18 ENCOUNTER — Telehealth: Payer: Self-pay

## 2019-03-18 NOTE — Telephone Encounter (Signed)
Transition Care Management Follow-up Telephone Call  Date of discharge and from where: 03/14/19 [facility]  How have you been since you were released from the hospital? Spoke to representative at group home, Vickii Chafe, she states he has been in bed since returning from hospital. He is very weak, not eating much at all and has a bed sore on left hip and between scrotum and anus.   Any questions or concerns? Yes   - caregivers concerned about lack of appetite and do they need to start adding nutritional supplements to his diet and if so, they would need an order.  Items Reviewed:  Did the pt receive and understand the discharge instructions provided? Yes   Medications obtained and verified? Yes   Any new allergies since your discharge? No   Dietary orders reviewed? Yes  Do you have support at home? Yes   Functional Questionnaire: (I = Independent and D = Dependent) ADLs: D  Bathing/Dressing- D  Meal Prep- D  Eating- D  Maintaining continence- D  Transferring/Ambulation- D  Managing Meds- D  Follow up appointments reviewed:   PCP Hospital f/u appt confirmed? Yes  Scheduled to see Dr. Ancil Boozer on 03/19/19 @ 2:00. VIRTUAL VISIT  Are transportation arrangements needed? No   If their condition worsens, is the pt aware to call PCP or go to the Emergency Dept.? Yes  Was the patient provided with contact information for the PCP's office or ED? Yes  Was to pt encouraged to call back with questions or concerns? Yes

## 2019-03-19 ENCOUNTER — Other Ambulatory Visit: Payer: Self-pay

## 2019-03-19 ENCOUNTER — Ambulatory Visit (INDEPENDENT_AMBULATORY_CARE_PROVIDER_SITE_OTHER): Payer: Medicare Other | Admitting: Family Medicine

## 2019-03-19 ENCOUNTER — Encounter: Payer: Self-pay | Admitting: Family Medicine

## 2019-03-19 VITALS — Temp 97.5°F

## 2019-03-19 DIAGNOSIS — U071 COVID-19: Secondary | ICD-10-CM

## 2019-03-19 DIAGNOSIS — Z09 Encounter for follow-up examination after completed treatment for conditions other than malignant neoplasm: Secondary | ICD-10-CM | POA: Diagnosis not present

## 2019-03-19 DIAGNOSIS — C61 Malignant neoplasm of prostate: Secondary | ICD-10-CM

## 2019-03-19 DIAGNOSIS — E441 Mild protein-calorie malnutrition: Secondary | ICD-10-CM | POA: Diagnosis not present

## 2019-03-19 DIAGNOSIS — L89311 Pressure ulcer of right buttock, stage 1: Secondary | ICD-10-CM

## 2019-03-19 DIAGNOSIS — R399 Unspecified symptoms and signs involving the genitourinary system: Secondary | ICD-10-CM

## 2019-03-19 DIAGNOSIS — J1289 Other viral pneumonia: Secondary | ICD-10-CM

## 2019-03-19 DIAGNOSIS — L89220 Pressure ulcer of left hip, unstageable: Secondary | ICD-10-CM

## 2019-03-19 MED ORDER — ENSURE NUTRITION SHAKE PO LIQD: 1.0000 | Freq: Two times a day (BID) | ORAL | 30 refills | Status: DC

## 2019-03-19 MED ORDER — CEFTRIAXONE SODIUM 250 MG IJ SOLR: 500.0000 mg | Freq: Once | INTRAMUSCULAR | 0 refills | Status: DC

## 2019-03-19 MED ORDER — BETAMETHASONE DIPROPIONATE AUG 0.05 % EX CREA: 1.0000 "application " | TOPICAL_CREAM | Freq: Every day | CUTANEOUS | 1 refills | Status: DC | PRN

## 2019-03-19 MED ORDER — CEFTRIAXONE SODIUM 250 MG IJ SOLR: 500.0000 mg | Freq: Once | INTRAMUSCULAR | 0 refills | Status: AC

## 2019-03-19 NOTE — Progress Notes (Signed)
Name: [name]   MRN: [id number]    DOB: 10-26-1940   Date:03/19/2019       Progress Note  Subjective  Chief Complaint  Chief Complaint  Patient presents with  . Hospitalization Follow-up    Positive for Covid. He stayed 5 days. He was put in the hospital because his O2 level was low. Vickii Chafe states that he is declining in his health. He has bed sores, decreased appetite, weakness. One employee tested positive in group home and spreaded it to patients.   Virtual Visit via Video Note  I connected with [name] on 03/19/19 at  2:00 PM EST by a video enabled telemedicine application and verified that I am speaking with the correct person using two identifiers.  Location: Patient: at group home  Provider: [facility] Caregiver: Vickii Chafe    I discussed the limitations of evaluation and management by telemedicine and the availability of in person appointments. The patient expressed understanding and agreed to proceed.  HPI  Hospital Discharge follow up: patient was admitted to [facility] on 03/10/2019 with acute hypoxic respiratory failure, he was diagnosed with COVID-19 pneumonia , he was given oxygen , Remdesivir for 5 days  and decradon was only needed for 2 days . Since discharge patient he is still very weak, he came home from the hospitals with bed sore on his left hip, left shoulder and sacrum area. He has poor appetite and has been bed bound since he got back to the group home . He was diagnosed with an UTI on 02/28/2019 and was given Macrobid and only took medication for a couple days only because he refused medication, we will send Rocephin injection for 3 days. They are carrying for the bed sores at the group home Reviewed labs: he had a drop in albumin - down to 2.7, CRP was elevated up to discharge date, ferritin elevated   Patient Active Problem List   Diagnosis Date Noted  . Pressure injury of skin 03/12/2019  . Moderate protein malnutrition ([place])  03/11/2019  . Pancytopenia ([address]) 03/04/2019  . Renal mass 07/23/2017  . Ureteral calculus 07/23/2017  . PAD (peripheral artery disease) ([place]) 07/04/2017  . Aortic valve calcification 07/04/2017  . Coronary artery calcification 07/04/2017  . Hypertension 10/26/2016  . Late effect acute polio 05/17/2016  . Chronic constipation 11/19/2015  . Mild major depression ([place]) 11/19/2015  . Complicated grieving 0000000  . Recurrent UTI 09/11/2015  . Adenocarcinoma of prostate ([place]) 03/02/2015  . Amyotrophia 03/02/2015  . Edema leg 03/02/2015  . Cerebral palsy ([place]) 03/02/2015  . Dyslipidemia 03/02/2015  . Dermatitis, eczematoid 03/02/2015  . 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 H/O acute poliomyelitis 03/02/2015  . Lymphedema 03/02/2015  . Intellectual disability 03/02/2015  . Hypertrophy of nail 03/02/2015  . Adiposity 03/02/2015  . Absence of bladder continence 03/02/2015  . Chronic venous insufficiency 03/02/2015  . Avitaminosis D 03/02/2015  . Adynamia 03/02/2015  . Dependent on wheelchair 03/02/2015  . Kidney cysts 03/02/2015  . At risk for falling 03/02/2015  . Cerebral vascular disease 03/02/2015  . Strabismus 02/11/2015  . Urge incontinence of urine 02/11/2015  . Wheelchair dependence 02/11/2015  . Obesity 02/11/2015  . Urinary frequency 10/30/2014    Past Surgical History:  Procedure Laterality Date  . CYSTOSCOPY/URETEROSCOPY/HOLMIUM LASER/STENT PLACEMENT Right 08/14/2017   Procedure: CYSTOSCOPY/URETEROSCOPY/HOLMIUM LASER/STENT PLACEMENT;  Surgeon: Abbie Sons, MD;  Location: [facility] ORS;  Service: Urology;  Laterality: Right;  . leg circulation surgery Right   .  PROSTATE SURGERY  11/10/2008   BRACHYTHERAPY    Family History  Problem Relation Age of Onset  . Heart attack Brother   . Heart disease Other     Social History   Socioeconomic History  . Marital status: Single    Spouse name: Not on file  . Number of children: Not on file  . Years of education: Not  on file  . Highest education level: Not on file  Occupational History  . Not on file  Social Needs  . Financial resource strain: Not hard at all  . Food insecurity    Worry: Never true    Inability: Never true  . Transportation needs    Medical: No    Non-medical: No  Tobacco Use  . Smoking status: Never Smoker  . Smokeless tobacco: Never Used  Substance and Sexual Activity  . Alcohol use: No    Alcohol/week: 0.0 standard drinks  . Drug use: No  . Sexual activity: Not Currently  Lifestyle  . Physical activity    Days per week: 0 days    Minutes per session: 0 min  . Stress: Not at all  Relationships  . Social Herbalist on phone: Never    Gets together: Never    Attends religious service: Never    Active member of club or organization: No    Attends meetings of clubs or organizations: Never    Relationship status: Never married  . Intimate partner violence    Fear of current or ex partner: Patient refused    Emotionally abused: Patient refused    Physically abused: Patient refused    Forced sexual activity: Patient refused  Other Topics Concern  . Not on file  Social History Narrative   Lives at [facility] group home, 5 males.          Current Outpatient Medications:  .  acetaminophen (TYLENOL) 325 MG tablet, Take 2 tablets (650 mg total) by mouth every 6 (six) hours as needed for mild pain or headache (fever >/= 101)., Disp:  , Rfl:  .  alfuzosin (UROXATRAL) 10 MG 24 hr tablet, TAKE 1 TABLET (10MG ) BY MOUTH ONCE DAILY WITH BREAKFAST*DO NOT CRUSH* (Patient taking differently: Take 10 mg by mouth daily with breakfast. ), Disp: 31 tablet, Rfl: 10 .  ALPRAZolam (XANAX) 0.25 MG tablet, TAKE ONE TABLET BY MOUTH 2 TIMES A DAY AS NEEDED FOR ANXIETY (WHEN BLOOD PRESSURE IS VERY HIGH OR ANXIOUS) (Patient taking differently: Take 0.25 mg by mouth 2 (two) times daily as needed for anxiety. TAKE ONE TABLET BY MOUTH 2 TIMES A DAY AS NEEDED FOR ANXIETY (WHEN BLOOD  PRESSURE IS VERY HIGH OR ANXIOUS)), Disp: 20 tablet, Rfl: 0 .  AMITIZA 24 MCG capsule, TAKE ONE CAPSULE BY MOUTH TWO TIMES A DAY WITH A MEAL (Patient taking differently: Take 24 mcg by mouth 2 (two) times daily with a meal. ), Disp: 62 capsule, Rfl: 10 .  ARTIFICIAL TEAR SOLUTION OP, Apply 2 drops to eye 2 (two) times daily as needed (dry eyes)., Disp: , Rfl:  .  aspirin (ASPIRIN ADULT LOW STRENGTH) 81 MG EC tablet, TAKE ONE TABLET BY MOUTH EVERY DAY *DO NOT CRUSH* (Patient taking differently: Take 81 mg by mouth daily. TAKE ONE TABLET BY MOUTH EVERY DAY *DO NOT CRUSH*), Disp: 31 tablet, Rfl: 10 .  atorvastatin (LIPITOR) 20 MG tablet, Take 1 tablet (20 mg total) by mouth daily. (Patient taking differently: Take 40 mg by mouth daily  at 6 PM. ), Disp: 90 tablet, Rfl: 1 .  augmented betamethasone dipropionate (DIPROLENE-AF) 0.05 % cream, Apply 1 application topically daily as needed (rash)., Disp: , Rfl:  .  Calcium Carb-Cholecalciferol 500-400 MG-UNIT TABS, TAKE ONE TABLET BY MOUTH 2 TIMES A DAY (Patient taking differently: Take 1 tablet by mouth 2 (two) times daily. TAKE ONE TABLET BY MOUTH 2 TIMES A DAY), Disp: 60 tablet, Rfl: 12 .  Cranberry Juice Powder 425 MG CAPS, TAKE ONE CAPSULE BY MOUTH EVERY DAY (Patient taking differently: Take 425 mg by mouth daily. ), Disp: 31 capsule, Rfl: 10 .  Elastic Bandages & Supports (MEDICAL COMPRESSION STOCKINGS) MISC, 1 Units by Does not apply route daily., Disp: 2 each, Rfl: 0 .  Incontinence Supply Disposable (DEPEND SILHOUETTE BRIEFS L/XL) MISC, 1 each by Does not apply route 2 (two) times daily as needed., Disp: 60 each, Rfl: 5 .  Polyethyl Glycol-Propyl Glycol (SYSTANE ULTRA) 0.4-0.3 % SOLN, Apply 1 drop to eye 2 (two) times daily. Both eyes, Disp: , Rfl:  .  polyethylene glycol (MIRALAX / GLYCOLAX) 17 g packet, Take 17 g by mouth daily. With 4 oz of juice or water, Disp: , Rfl:  .  sertraline (ZOLOFT) 100 MG tablet, TAKE 1 TABLET BY MOUTH ONCE DAILY (Patient  taking differently: Take 100 mg by mouth daily. ), Disp: 31 tablet, Rfl: 10 .  vitamin C (ASCORBIC ACID) 500 MG tablet, TAKE ONE TABLET BY MOUTH TWO TIMES A DAY (Patient taking differently: Take 500 mg by mouth 2 (two) times daily. ), Disp: 56 tablet, Rfl: 10  No Known Allergies  I personally reviewed active problem list, medication list, allergies, family history, social history, health maintenance with the patient/caregiver today.   ROS  Ten systems reviewed and is negative except as mentioned in HPI   Objective  Vitals:   03/19/19 1147  Temp: (!) 97.5 F (36.4 C)  TempSrc: Temporal  SpO2: 97%    There is no height or weight on file to calculate BMI.  Physical Exam  Awake, laying on a medical bed, very thin face, temporal waiting, cooperative but not as cheerful.   Recent Results (from the past 2160 hour(s))  CBC with Differential/Platelet     Status: Abnormal   Collection Time: 01/06/19  9:36 AM  Result Value Ref Range   WBC 3.4 (L) 4.0 - 10.5 K/uL   RBC 3.74 (L) 4.22 - 5.81 MIL/uL   Hemoglobin 12.2 (L) 13.0 - 17.0 g/dL   HCT 35.8 (L) 39.0 - 52.0 %   MCV 95.7 80.0 - 100.0 fL   MCH 32.6 26.0 - 34.0 pg   MCHC 34.1 30.0 - 36.0 g/dL   RDW 12.9 11.5 - 15.5 %   Platelets 136 (L) 150 - 400 K/uL   nRBC 0.0 0.0 - 0.2 %   Neutrophils Relative % 58 %   Neutro Abs 2.0 1.7 - 7.7 K/uL   Lymphocytes Relative 34 %   Lymphs Abs 1.2 0.7 - 4.0 K/uL   Monocytes Relative 5 %   Monocytes Absolute 0.2 0.1 - 1.0 K/uL   Eosinophils Relative 2 %   Eosinophils Absolute 0.1 0.0 - 0.5 K/uL   Basophils Relative 1 %   Basophils Absolute 0.0 0.0 - 0.1 K/uL   Immature Granulocytes 0 %   Abs Immature Granulocytes 0.01 0.00 - 0.07 K/uL    Comment: Performed at [facility], [address], [place], [place] [address]  Lipid panel     Status: Abnormal  Collection Time: 02/27/19 12:00 AM  Result Value Ref Range   Cholesterol 72 <200 mg/dL   HDL 39 (L) > OR = 40 mg/dL   Triglycerides  49 <150 mg/dL   LDL Cholesterol (Calc) 19 mg/dL (calc)    Comment: Reference range: <100 . Desirable range <100 mg/dL for primary prevention;   <70 mg/dL for patients with CHD or diabetic patients  with > or = 2 CHD risk factors. Marland Kitchen LDL-C is now calculated using the Martin-Hopkins  calculation, which is a validated novel method providing  better accuracy than the Friedewald equation in the  estimation of LDL-C.  Cresenciano Genre et al. Annamaria Helling. [id number]): 2061-2068  (http://education.[employer].com/faq/FAQ164)    Total CHOL/HDL Ratio 1.8 <5.0 (calc)   Non-HDL Cholesterol (Calc) 33 <130 mg/dL (calc)    Comment: For patients with diabetes plus 1 major ASCVD risk  factor, treating to a non-HDL-C goal of <100 mg/dL  (LDL-C of <70 mg/dL) is considered a therapeutic  option.   COMPLETE METABOLIC PANEL WITH GFR     Status: None   Collection Time: 02/27/19 12:00 AM  Result Value Ref Range   Glucose, Bld 85 65 - 99 mg/dL    Comment: .            Fasting reference interval .    BUN 15 7 - 25 mg/dL   Creat 0.71 0.70 - 1.18 mg/dL    Comment: For patients >17 years of age, the reference limit for Creatinine is approximately 13% higher for people identified as African-American. .    GFR, Est Non African American 91 > OR = 60 mL/min/1.63m2   GFR, Est African American 106 > OR = 60 mL/min/1.17m2   BUN/Creatinine Ratio NOT APPLICABLE 6 - 22 (calc)   Sodium 141 135 - 146 mmol/L   Potassium 4.0 3.5 - 5.3 mmol/L   Chloride 106 98 - 110 mmol/L   CO2 29 20 - 32 mmol/L   Calcium 9.1 8.6 - 10.3 mg/dL   Total Protein 6.4 6.1 - 8.1 g/dL   Albumin 3.7 3.6 - 5.1 g/dL   Globulin 2.7 1.9 - 3.7 g/dL (calc)   AG Ratio 1.4 1.0 - 2.5 (calc)   Total Bilirubin 0.7 0.2 - 1.2 mg/dL   Alkaline phosphatase (APISO) 109 35 - 144 U/L   AST 14 10 - 35 U/L   ALT 10 9 - 46 U/L  CBC with Differential/Platelet     Status: Abnormal   Collection Time: 02/27/19 12:00 AM  Result Value Ref Range   WBC 2.6 (L) 3.8 -  10.8 Thousand/uL   RBC 3.97 (L) 4.20 - 5.80 Million/uL   Hemoglobin 13.0 (L) 13.2 - 17.1 g/dL   HCT 38.0 (L) 38.5 - 50.0 %   MCV 95.7 80.0 - 100.0 fL   MCH 32.7 27.0 - 33.0 pg   MCHC 34.2 32.0 - 36.0 g/dL   RDW 12.2 11.0 - 15.0 %   Platelets 134 (L) 140 - 400 Thousand/uL   MPV 10.8 7.5 - 12.5 fL   Neutro Abs 1,984 1,500 - 7,800 cells/uL   Lymphs Abs 372 (L) 850 - 3,900 cells/uL   Absolute Monocytes 192 (L) 200 - 950 cells/uL   Eosinophils Absolute 42 15 - 500 cells/uL   Basophils Absolute 10 0 - 200 cells/uL   Neutrophils Relative % 76.3 %   Total Lymphocyte 14.3 %   Monocytes Relative 7.4 %   Eosinophils Relative 1.6 %   Basophils Relative 0.4 %  CULTURE, URINE COMPREHENSIVE  Status: Abnormal   Collection Time: 02/28/19 12:00 AM   Specimen: Urine  Result Value Ref Range   MICRO NUMBER: [id number]    SPECIMEN QUALITY: Adequate    Source OTHER (SPECIFY)    STATUS: FINAL    ISOLATE 1: Enterococcus faecalis (A)     Comment: Greater than 100,000 CFU/mL of Enterococcus faecalis      Susceptibility   Enterococcus faecalis - CULT, URN, SPECIAL POSITIVE 1    AMPICILLIN <=2 Sensitive     VANCOMYCIN 1 Sensitive     NITROFURANTOIN* <=16 Sensitive      * Legend:S = Susceptible  I = IntermediateR = Resistant  NS = Not susceptible* = Not tested  NR = Not reported**NN = See antimicrobic comments  Lactic acid, plasma     Status: None   Collection Time: 03/09/19  7:08 PM  Result Value Ref Range   Lactic Acid, Venous 1.8 0.5 - 1.9 mmol/L    Comment: Performed at [facility], [address] [place], [place] [address]  CBC WITH DIFFERENTIAL     Status: Abnormal   Collection Time: 03/09/19  7:26 PM  Result Value Ref Range   WBC 10.6 (H) 4.0 - 10.5 K/uL   RBC 3.89 (L) 4.22 - 5.81 MIL/uL   Hemoglobin 12.4 (L) 13.0 - 17.0 g/dL   HCT 36.5 (L) 39.0 - 52.0 %   MCV 93.8 80.0 - 100.0 fL   MCH 31.9 26.0 - 34.0 pg   MCHC 34.0 30.0 - 36.0 g/dL   RDW 13.0 11.5 - 15.5 %   Platelets 127  (L) 150 - 400 K/uL   nRBC 0.0 0.0 - 0.2 %   Neutrophils Relative % 90 %   Neutro Abs 9.5 (H) 1.7 - 7.7 K/uL   Lymphocytes Relative 5 %   Lymphs Abs 0.5 (L) 0.7 - 4.0 K/uL   Monocytes Relative 4 %   Monocytes Absolute 0.4 0.1 - 1.0 K/uL   Eosinophils Relative 0 %   Eosinophils Absolute 0.0 0.0 - 0.5 K/uL   Basophils Relative 0 %   Basophils Absolute 0.0 0.0 - 0.1 K/uL   Immature Granulocytes 1 %   Abs Immature Granulocytes 0.11 (H) 0.00 - 0.07 K/uL    Comment: Performed at [facility], [address] [place], [place] [address]  Comprehensive metabolic panel     Status: Abnormal   Collection Time: 03/09/19  7:26 PM  Result Value Ref Range   Sodium 145 135 - 145 mmol/L   Potassium 3.8 3.5 - 5.1 mmol/L   Chloride 108 98 - 111 mmol/L   CO2 23 22 - 32 mmol/L   Glucose, Bld 128 (H) 70 - 99 mg/dL   BUN 54 (H) 8 - 23 mg/dL   Creatinine, Ser 0.85 0.61 - 1.24 mg/dL   Calcium 9.6 8.9 - 10.3 mg/dL   Total Protein 7.5 6.5 - 8.1 g/dL   Albumin 3.7 3.5 - 5.0 g/dL   AST 36 15 - 41 U/L   ALT 15 0 - 44 U/L   Alkaline Phosphatase 70 38 - 126 U/L   Total Bilirubin 1.2 0.3 - 1.2 mg/dL   GFR calc non Af Amer >60 >60 mL/min   GFR calc Af Amer >60 >60 mL/min   Anion gap 14 5 - 15    Comment: Performed at [facility], [address], [place], [place] [address]  Fibrin derivatives D-Dimer     Status: Abnormal   Collection Time: 03/09/19  7:26 PM  Result Value Ref Range   Fibrin derivatives D-dimer ([facility]) 2,489.25 (H) 0.00 - 499.00 ng/mL (FEU)    Comment: (NOTE) <> Exclusion of Venous Thromboembolism (VTE) - OUTPATIENT ONLY   (Emergency Department or [place])   0-499 ng/ml (FEU): With a low to intermediate pretest probability                      for VTE this test result excludes the diagnosis                      of VTE.   >499 ng/ml (FEU) : VTE not excluded; additional work up for VTE is                      required. <> Testing on Inpatients and Evaluation of Disseminated  Intravascular   Coagulation (DIC) Reference Range:   0-499 ng/ml (FEU) Performed at [facility], [address] [place], [place] [address]   Procalcitonin     Status: None   Collection Time: 03/09/19  7:26 PM  Result Value Ref Range   Procalcitonin 0.17 ng/mL    Comment:        Interpretation: PCT (Procalcitonin) <= 0.5 ng/mL: Systemic infection (sepsis) is not likely. Local bacterial infection is possible. (NOTE)       Sepsis PCT Algorithm           Lower Respiratory Tract                                      Infection PCT Algorithm    ----------------------------     ----------------------------         PCT < 0.25 ng/mL                PCT < 0.10 ng/mL         Strongly encourage             Strongly discourage   discontinuation of antibiotics    initiation of antibiotics    ----------------------------     -----------------------------       PCT 0.25 - 0.50 ng/mL            PCT 0.10 - 0.25 ng/mL               OR       >80% decrease in PCT            Discourage initiation of                                            antibiotics      Encourage discontinuation           of antibiotics    ----------------------------     -----------------------------         PCT >= 0.50 ng/mL              PCT 0.26 - 0.50 ng/mL               AND        <80% decrease in PCT             Encourage initiation of  antibiotics       Encourage continuation           of antibiotics    ----------------------------     -----------------------------        PCT >= 0.50 ng/mL                  PCT > 0.50 ng/mL               AND         increase in PCT                  Strongly encourage                                      initiation of antibiotics    Strongly encourage escalation           of antibiotics                                     -----------------------------                                           PCT <= 0.25 ng/mL                                                  OR                                        > 80% decrease in PCT                                     Discontinue / Do not initiate                                             antibiotics Performed at [facility], [address] [place], [place] [address]   Ferritin     Status: Abnormal   Collection Time: 03/09/19  7:26 PM  Result Value Ref Range   Ferritin 500 (H) 24 - 336 ng/mL    Comment: Performed at [facility], [place], [place] [address]  Troponin I (High Sensitivity)     Status: None   Collection Time: 03/09/19  7:26 PM  Result Value Ref Range   Troponin I (High Sensitivity) 14 <18 ng/L    Comment: (NOTE) Elevated high sensitivity troponin I (hsTnI) values and significant  changes across serial measurements may suggest ACS but many other  chronic and acute conditions are known to elevate hsTnI results.  Refer to the "Links" section for chest pain algorithms and additional  guidance. Performed at [facility], [address] [place],  [address]   SARS Coronavirus 2 by RT PCR (hospital order, performed in [facility] hospital lab) Nasopharyngeal Nasopharyngeal  Swab     Status: Abnormal   Collection Time: 03/09/19 10:25 PM   Specimen: Nasopharyngeal Swab  Result Value Ref Range   SARS Coronavirus 2 POSITIVE (A) NEGATIVE    Comment: RESULT CALLED TO, READ BACK BY AND VERIFIED WITH: SHERRY ALLISON @029  03/10/2019 TTG (NOTE) If result is NEGATIVE SARS-CoV-2 target nucleic acids are NOT DETECTED. The SARS-CoV-2 RNA is generally detectable in upper and lower  respiratory specimens during the acute phase of infection. The lowest  concentration of SARS-CoV-2 viral copies this assay can detect is 250  copies / mL. A negative result does not preclude SARS-CoV-2 infection  and should not be used as the sole basis for treatment or other  patient management decisions.  A negative result may occur with   improper specimen collection / handling, submission of specimen other  than nasopharyngeal swab, presence of viral mutation(s) within the  areas targeted by this assay, and inadequate number of viral copies  (<250 copies / mL). A negative result must be combined with clinical  observations, patient history, and epidemiological information. If result is POSITIVE SARS-CoV-2 target nucleic acids are DETECTED. Th e SARS-CoV-2 RNA is generally detectable in upper and lower  respiratory specimens during the acute phase of infection.  Positive  results are indicative of active infection with SARS-CoV-2.  Clinical  correlation with patient history and other diagnostic information is  necessary to determine patient infection status.  Positive results do  not rule out bacterial infection or co-infection with other viruses. If result is PRESUMPTIVE POSTIVE SARS-CoV-2 nucleic acids MAY BE PRESENT.   A presumptive positive result was obtained on the submitted specimen  and confirmed on repeat testing.  While 2019 novel coronavirus  (SARS-CoV-2) nucleic acids may be present in the submitted sample  additional confirmatory testing may be necessary for epidemiological  and / or clinical management purposes  to differentiate between  SARS-CoV-2 and other Sarbecovirus currently known to infect humans.  If clinically indicated additional testing with an alternate test  methodology [phone]) is  advised. The SARS-CoV-2 RNA is generally  detectable in upper and lower respiratory specimens during the acute  phase of infection. The expected result is Negative. Fact Sheet for Patients:  StrictlyIdeas.no Fact Sheet for Healthcare Providers: BankingDealers.co.za This test is not yet approved or cleared by the [place] FDA and has been authorized for detection and/or diagnosis of SARS-CoV-2 by FDA under an Emergency Use Authorization (EUA).  This EUA will  remain in effect (meaning this test can be used) for the duration of the COVID-19 declaration under Section 564(b)(1) of the Act, 21 U.S.C. section 360bbb-3(b)(1), unless the authorization is terminated or revoked sooner. Performed at [facility], [address] [place], [place] [address]   Lactic acid, plasma     Status: None   Collection Time: 03/09/19 10:45 PM  Result Value Ref Range   Lactic Acid, Venous 0.9 0.5 - 1.9 mmol/L    Comment: Performed at [facility], [place], [place] [address]  Troponin I (High Sensitivity)     Status: None   Collection Time: 03/09/19 10:45 PM  Result Value Ref Range   Troponin I (High Sensitivity) 16 <18 ng/L    Comment: (NOTE) Elevated high sensitivity troponin I (hsTnI) values and significant  changes across serial measurements may suggest ACS but many other  chronic and acute conditions are known to elevate hsTnI results.  Refer to the "Links" section for chest pain algorithms and additional  guidance. Performed at [facility], [address] [place], [place] [address]   MRSA PCR Screening     Status: None   Collection Time: 03/10/19  6:23 AM   Specimen: Nasal Mucosa; Nasopharyngeal  Result Value Ref Range   MRSA by PCR NEGATIVE NEGATIVE    Comment:        The GeneXpert MRSA Assay (FDA approved for NASAL specimens only), is one component of a comprehensive MRSA colonization surveillance program. It is not intended to diagnose MRSA infection nor to guide or monitor treatment for MRSA infections. Performed at [facility], [place] [address] [place], [place] [address]   ABO/Rh     Status: None   Collection Time: 03/10/19  7:50 AM  Result Value Ref Range   ABO/RH(D)      B POS Performed at [facility], [place] [address] [place], [place] [address]   C-reactive protein     Status: Abnormal   Collection Time: 03/10/19  7:50 AM  Result Value Ref Range    CRP 19.6 (H) <1.0 mg/dL    Comment: Performed at [facility], [place] [address] [place], [place] [address]  D-dimer, quantitative (not at [facility])     Status: Abnormal   Collection Time: 03/10/19  7:50 AM  Result Value Ref Range   D-Dimer, Quant 1.93 (H) 0.00 - 0.50 ug/mL-FEU    Comment: (NOTE) At the manufacturer cut-off of 0.50 ug/mL FEU, this assay has been documented to exclude PE with a sensitivity and negative predictive value of 97 to 99%.  At this time, this assay has not been approved by the FDA to exclude DVT/VTE. Results should be correlated with clinical presentation. Performed at [facility], [place] [address], [place], [place] [address]   Comprehensive metabolic panel     Status: Abnormal   Collection Time: 03/10/19  7:50 AM  Result Value Ref Range   Sodium 146 (H) 135 - 145 mmol/L   Potassium 3.6 3.5 - 5.1 mmol/L   Chloride 109 98 - 111 mmol/L   CO2 27 22 - 32 mmol/L   Glucose, Bld 133 (H) 70 - 99 mg/dL   BUN 52 (H) 8 - 23 mg/dL   Creatinine, Ser 0.67 0.61 - 1.24 mg/dL   Calcium 8.9 8.9 - 10.3 mg/dL   Total Protein 7.1 6.5 - 8.1 g/dL   Albumin 3.2 (L) 3.5 - 5.0 g/dL   AST 38 15 - 41 U/L   ALT 16 0 - 44 U/L   Alkaline Phosphatase 70 38 - 126 U/L   Total Bilirubin 0.9 0.3 - 1.2 mg/dL   GFR calc non Af Amer >60 >60 mL/min   GFR calc Af Amer >60 >60 mL/min   Anion gap 10 5 - 15    Comment: Performed at [facility], [place] [address], [place], [place] [address]  CBC with Differential/Platelet     Status: Abnormal   Collection Time: 03/10/19  7:50 AM  Result Value Ref Range   WBC 8.0 4.0 - 10.5 K/uL   RBC 3.74 (L) 4.22 - 5.81 MIL/uL   Hemoglobin 12.2 (L) 13.0 - 17.0 g/dL   HCT 36.3 (L) 39.0 - 52.0 %   MCV 97.1 80.0 - 100.0 fL   MCH 32.6 26.0 - 34.0 pg   MCHC 33.6 30.0 - 36.0 g/dL   RDW 13.1 11.5 - 15.5 %   Platelets 133 (L) 150 - 400 K/uL   nRBC 0.0 0.0 -  0.2 %   Neutrophils Relative % 91 %   Neutro Abs 7.3  1.7 - 7.7 K/uL   Lymphocytes Relative 6 %   Lymphs Abs 0.5 (L) 0.7 - 4.0 K/uL   Monocytes Relative 2 %   Monocytes Absolute 0.2 0.1 - 1.0 K/uL   Eosinophils Relative 0 %   Eosinophils Absolute 0.0 0.0 - 0.5 K/uL   Basophils Relative 0 %   Basophils Absolute 0.0 0.0 - 0.1 K/uL   Immature Granulocytes 1 %   Abs Immature Granulocytes 0.08 (H) 0.00 - 0.07 K/uL    Comment: Performed at [facility], [place] [address] [place], [place] [address]  Magnesium     Status: None   Collection Time: 03/10/19  7:50 AM  Result Value Ref Range   Magnesium 2.3 1.7 - 2.4 mg/dL    Comment: Performed at [facility], [place] [address] [place], [place] [address]  Phosphorus     Status: None   Collection Time: 03/10/19  7:50 AM  Result Value Ref Range   Phosphorus 3.8 2.5 - 4.6 mg/dL    Comment: Performed at [facility], [place] [address] [place], [place] [address]  Ferritin     Status: Abnormal   Collection Time: 03/10/19  7:50 AM  Result Value Ref Range   Ferritin 463 (H) 24 - 336 ng/mL    Comment: Performed at [facility], [place] [address] [place], [place] [address]  Lactate dehydrogenase     Status: Abnormal   Collection Time: 03/10/19  7:50 AM  Result Value Ref Range   LDH 264 (H) 98 - 192 U/L    Comment: Performed at [facility], [place] [address], [place], [place] [address]  CBC with Differential/Platelet     Status: Abnormal   Collection Time: 03/11/19  4:40 AM  Result Value Ref Range   WBC 6.1 4.0 - 10.5 K/uL   RBC 3.47 (L) 4.22 - 5.81 MIL/uL   Hemoglobin 11.1 (L) 13.0 - 17.0 g/dL   HCT 33.5 (L) 39.0 - 52.0 %   MCV 96.5 80.0 - 100.0 fL   MCH 32.0 26.0 - 34.0 pg   MCHC 33.1 30.0 - 36.0 g/dL   RDW 13.2 11.5 - 15.5 %   Platelets 152 150 - 400 K/uL   nRBC 0.0 0.0 - 0.2 %   Neutrophils Relative % 90 %   Neutro Abs 5.5 1.7 - 7.7 K/uL   Lymphocytes Relative 7 %   Lymphs Abs 0.4 (L) 0.7 - 4.0 K/uL    Monocytes Relative 2 %   Monocytes Absolute 0.1 0.1 - 1.0 K/uL   Eosinophils Relative 0 %   Eosinophils Absolute 0.0 0.0 - 0.5 K/uL   Basophils Relative 0 %   Basophils Absolute 0.0 0.0 - 0.1 K/uL   Immature Granulocytes 1 %   Abs Immature Granulocytes 0.03 0.00 - 0.07 K/uL    Comment: Performed at [facility], [place] [address], [place], [place] [address]  Comprehensive metabolic panel     Status: Abnormal   Collection Time: 03/11/19  4:40 AM  Result Value Ref Range   Sodium 149 (H) 135 - 145 mmol/L   Potassium 4.1 3.5 - 5.1 mmol/L   Chloride 116 (H) 98 - 111 mmol/L   CO2 24 22 - 32 mmol/L   Glucose, Bld 127 (H) 70 - 99 mg/dL   BUN 52 (H) 8 - 23 mg/dL   Creatinine, Ser 0.62 0.61 -  1.24 mg/dL   Calcium 8.5 (L) 8.9 - 10.3 mg/dL   Total Protein 6.3 (L) 6.5 - 8.1 g/dL   Albumin 3.0 (L) 3.5 - 5.0 g/dL   AST 36 15 - 41 U/L   ALT 17 0 - 44 U/L   Alkaline Phosphatase 68 38 - 126 U/L   Total Bilirubin 1.2 0.3 - 1.2 mg/dL   GFR calc non Af Amer >60 >60 mL/min   GFR calc Af Amer >60 >60 mL/min   Anion gap 9 5 - 15    Comment: Performed at [facility], [place] [address] [place], [place] [address]  C-reactive protein     Status: Abnormal   Collection Time: 03/11/19  4:40 AM  Result Value Ref Range   CRP 12.4 (H) <1.0 mg/dL    Comment: Performed at [facility], [place] [address], [place], [place] [address]  D-dimer, quantitative (not at [facility])     Status: Abnormal   Collection Time: 03/11/19  4:40 AM  Result Value Ref Range   D-Dimer, Quant 1.26 (H) 0.00 - 0.50 ug/mL-FEU    Comment: (NOTE) At the manufacturer cut-off of 0.50 ug/mL FEU, this assay has been documented to exclude PE with a sensitivity and negative predictive value of 97 to 99%.  At this time, this assay has not been approved by the FDA to exclude DVT/VTE. Results should be correlated with clinical presentation. Performed at [facility], [place]  [address], [place], [place] [address]   Magnesium     Status: None   Collection Time: 03/11/19  4:40 AM  Result Value Ref Range   Magnesium 2.3 1.7 - 2.4 mg/dL    Comment: Performed at [facility], [place] [address] [place], [place] [address]  Phosphorus     Status: None   Collection Time: 03/11/19  4:40 AM  Result Value Ref Range   Phosphorus 2.5 2.5 - 4.6 mg/dL    Comment: Performed at [facility], [place] [address], [place], [place] [address]  Lactate dehydrogenase     Status: Abnormal   Collection Time: 03/11/19  4:40 AM  Result Value Ref Range   LDH 279 (H) 98 - 192 U/L    Comment: Performed at [facility], [place] [address] [place], [place] [address]  Ferritin     Status: Abnormal   Collection Time: 03/11/19  4:40 AM  Result Value Ref Range   Ferritin 520 (H) 24 - 336 ng/mL    Comment: Performed at [facility], [place] [address], [place], [place] [address]  CBC with Differential/Platelet     Status: Abnormal   Collection Time: 03/12/19  2:35 AM  Result Value Ref Range   WBC 6.0 4.0 - 10.5 K/uL   RBC 3.48 (L) 4.22 - 5.81 MIL/uL   Hemoglobin 11.4 (L) 13.0 - 17.0 g/dL   HCT 34.0 (L) 39.0 - 52.0 %   MCV 97.7 80.0 - 100.0 fL   MCH 32.8 26.0 - 34.0 pg   MCHC 33.5 30.0 - 36.0 g/dL   RDW 13.4 11.5 - 15.5 %   Platelets 144 (L) 150 - 400 K/uL   nRBC 0.0 0.0 - 0.2 %   Neutrophils Relative % 85 %   Neutro Abs 5.1 1.7 - 7.7 K/uL   Lymphocytes Relative 9 %   Lymphs Abs 0.6 (L) 0.7 - 4.0 K/uL   Monocytes Relative 5 %   Monocytes Absolute 0.3 0.1 - 1.0 K/uL  Eosinophils Relative 0 %   Eosinophils Absolute 0.0 0.0 - 0.5 K/uL   Basophils Relative 0 %   Basophils Absolute 0.0 0.0 - 0.1 K/uL   Immature Granulocytes 1 %   Abs Immature Granulocytes 0.05 0.00 - 0.07 K/uL    Comment: Performed at [facility], [place] [address] [place], [place] [address]  Comprehensive metabolic panel     Status:  Abnormal   Collection Time: 03/12/19  2:35 AM  Result Value Ref Range   Sodium 147 (H) 135 - 145 mmol/L   Potassium 4.1 3.5 - 5.1 mmol/L   Chloride 114 (H) 98 - 111 mmol/L   CO2 24 22 - 32 mmol/L   Glucose, Bld 124 (H) 70 - 99 mg/dL   BUN 46 (H) 8 - 23 mg/dL   Creatinine, Ser 0.54 (L) 0.61 - 1.24 mg/dL   Calcium 8.8 (L) 8.9 - 10.3 mg/dL   Total Protein 6.1 (L) 6.5 - 8.1 g/dL   Albumin 2.9 (L) 3.5 - 5.0 g/dL   AST 27 15 - 41 U/L   ALT 18 0 - 44 U/L   Alkaline Phosphatase 62 38 - 126 U/L   Total Bilirubin 1.0 0.3 - 1.2 mg/dL   GFR calc non Af Amer >60 >60 mL/min   GFR calc Af Amer >60 >60 mL/min   Anion gap 9 5 - 15    Comment: Performed at [facility], [place] [address], [place], [place] [address]  C-reactive protein     Status: Abnormal   Collection Time: 03/12/19  2:35 AM  Result Value Ref Range   CRP 7.3 (H) <1.0 mg/dL    Comment: Performed at [facility], [place] [address] [place], [place] [address]  D-dimer, quantitative (not at [facility])     Status: Abnormal   Collection Time: 03/12/19  2:35 AM  Result Value Ref Range   D-Dimer, Quant 1.41 (H) 0.00 - 0.50 ug/mL-FEU    Comment: (NOTE) At the manufacturer cut-off of 0.50 ug/mL FEU, this assay has been documented to exclude PE with a sensitivity and negative predictive value of 97 to 99%.  At this time, this assay has not been approved by the FDA to exclude DVT/VTE. Results should be correlated with clinical presentation. Performed at [facility], [place] [address] [place], [place] [address]   CBC with Differential/Platelet     Status: Abnormal   Collection Time: 03/13/19  2:41 AM  Result Value Ref Range   WBC 5.2 4.0 - 10.5 K/uL   RBC 3.60 (L) 4.22 - 5.81 MIL/uL   Hemoglobin 11.7 (L) 13.0 - 17.0 g/dL   HCT 35.2 (L) 39.0 - 52.0 %   MCV 97.8 80.0 - 100.0 fL   MCH 32.5 26.0 - 34.0 pg   MCHC 33.2 30.0 - 36.0 g/dL   RDW 13.2 11.5 - 15.5 %   Platelets 157 150 - 400 K/uL    nRBC 0.0 0.0 - 0.2 %   Neutrophils Relative % 74 %   Neutro Abs 3.8 1.7 - 7.7 K/uL   Lymphocytes Relative 19 %   Lymphs Abs 1.0 0.7 - 4.0 K/uL   Monocytes Relative 6 %   Monocytes Absolute 0.3 0.1 - 1.0 K/uL   Eosinophils Relative 0 %   Eosinophils Absolute 0.0 0.0 - 0.5 K/uL   Basophils Relative 0 %   Basophils Absolute 0.0 0.0 - 0.1 K/uL   Immature Granulocytes 1 %   Abs Immature Granulocytes 0.05 0.00 -  0.07 K/uL    Comment: Performed at [facility], [place] [address] [place], [place] [address]  Comprehensive metabolic panel     Status: Abnormal   Collection Time: 03/13/19  2:41 AM  Result Value Ref Range   Sodium 142 135 - 145 mmol/L   Potassium 3.3 (L) 3.5 - 5.1 mmol/L    Comment: DELTA CHECK NOTED   Chloride 107 98 - 111 mmol/L   CO2 26 22 - 32 mmol/L   Glucose, Bld 99 70 - 99 mg/dL   BUN 35 (H) 8 - 23 mg/dL   Creatinine, Ser 0.60 (L) 0.61 - 1.24 mg/dL   Calcium 8.2 (L) 8.9 - 10.3 mg/dL   Total Protein 5.8 (L) 6.5 - 8.1 g/dL   Albumin 2.8 (L) 3.5 - 5.0 g/dL   AST 31 15 - 41 U/L   ALT 21 0 - 44 U/L   Alkaline Phosphatase 65 38 - 126 U/L   Total Bilirubin 0.9 0.3 - 1.2 mg/dL   GFR calc non Af Amer >60 >60 mL/min   GFR calc Af Amer >60 >60 mL/min   Anion gap 9 5 - 15    Comment: Performed at [facility], [place] [address], [place], [place] [address]  C-reactive protein     Status: Abnormal   Collection Time: 03/13/19  2:41 AM  Result Value Ref Range   CRP 3.7 (H) <1.0 mg/dL    Comment: Performed at [facility], [place] [address], [place], [place] [address]  D-dimer, quantitative (not at [facility])     Status: Abnormal   Collection Time: 03/13/19  2:41 AM  Result Value Ref Range   D-Dimer, Quant 1.23 (H) 0.00 - 0.50 ug/mL-FEU    Comment: (NOTE) At the manufacturer cut-off of 0.50 ug/mL FEU, this assay has been documented to exclude PE with a sensitivity and negative predictive value of 97 to 99%.  At this time, this  assay has not been approved by the FDA to exclude DVT/VTE. Results should be correlated with clinical presentation. Performed at [facility], [place] [address], [place], [place] [address]   CBC with Differential/Platelet     Status: Abnormal   Collection Time: 03/14/19 12:12 AM  Result Value Ref Range   WBC 5.8 4.0 - 10.5 K/uL   RBC 3.54 (L) 4.22 - 5.81 MIL/uL   Hemoglobin 11.3 (L) 13.0 - 17.0 g/dL   HCT 34.3 (L) 39.0 - 52.0 %   MCV 96.9 80.0 - 100.0 fL   MCH 31.9 26.0 - 34.0 pg   MCHC 32.9 30.0 - 36.0 g/dL   RDW 13.1 11.5 - 15.5 %   Platelets 169 150 - 400 K/uL   nRBC 0.0 0.0 - 0.2 %   Neutrophils Relative % 77 %   Neutro Abs 4.5 1.7 - 7.7 K/uL   Lymphocytes Relative 14 %   Lymphs Abs 0.8 0.7 - 4.0 K/uL   Monocytes Relative 7 %   Monocytes Absolute 0.4 0.1 - 1.0 K/uL   Eosinophils Relative 1 %   Eosinophils Absolute 0.0 0.0 - 0.5 K/uL   Basophils Relative 0 %   Basophils Absolute 0.0 0.0 - 0.1 K/uL   Immature Granulocytes 1 %   Abs Immature Granulocytes 0.06 0.00 - 0.07 K/uL    Comment: Performed at [facility], [place] [address], [place], [place] [address]  Comprehensive metabolic panel     Status: Abnormal   Collection Time: 03/14/19 12:12 AM  Result  Value Ref Range   Sodium 146 (H) 135 - 145 mmol/L   Potassium 4.1 3.5 - 5.1 mmol/L    Comment: DELTA CHECK NOTED   Chloride 111 98 - 111 mmol/L   CO2 28 22 - 32 mmol/L   Glucose, Bld 94 70 - 99 mg/dL   BUN 30 (H) 8 - 23 mg/dL   Creatinine, Ser 0.51 (L) 0.61 - 1.24 mg/dL   Calcium 8.6 (L) 8.9 - 10.3 mg/dL   Total Protein 5.8 (L) 6.5 - 8.1 g/dL   Albumin 2.7 (L) 3.5 - 5.0 g/dL   AST 30 15 - 41 U/L   ALT 25 0 - 44 U/L   Alkaline Phosphatase 78 38 - 126 U/L   Total Bilirubin 0.6 0.3 - 1.2 mg/dL   GFR calc non Af Amer >60 >60 mL/min   GFR calc Af Amer >60 >60 mL/min   Anion gap 7 5 - 15    Comment: Performed at [facility], [place] [address] [place], [place] [address]   C-reactive protein     Status: Abnormal   Collection Time: 03/14/19 12:12 AM  Result Value Ref Range   CRP 5.3 (H) <1.0 mg/dL    Comment: Performed at [facility], [place] [address], [place], [place] [address]  D-dimer, quantitative (not at [facility])     Status: Abnormal   Collection Time: 03/14/19 12:12 AM  Result Value Ref Range   D-Dimer, Quant 1.27 (H) 0.00 - 0.50 ug/mL-FEU    Comment: (NOTE) At the manufacturer cut-off of 0.50 ug/mL FEU, this assay has been documented to exclude PE with a sensitivity and negative predictive value of 97 to 99%.  At this time, this assay has not been approved by the FDA to exclude DVT/VTE. Results should be correlated with clinical presentation. Performed at [facility], [place] [address], [place], [place] [address]       PHQ2/9: Depression screen [facility] 2/9 02/27/2019 10/03/2018 08/20/2018 05/24/2018 05/17/2018  Decreased Interest 0 0 0 0 0  Down, Depressed, Hopeless 0 0 0 0 0  PHQ - 2 Score 0 0 0 0 0  Altered sleeping 0 0 0 0 0  Tired, decreased energy 0 0 0 0 0  Change in appetite 0 0 0 0 0  Feeling bad or failure about yourself  0 0 0 0 0  Trouble concentrating 0 0 0 0 0  Moving slowly or fidgety/restless 0 0 0 0 0  Suicidal thoughts 0 0 0 0 0  PHQ-9 Score 0 0 0 0 0  Difficult doing work/chores - - Not difficult at all Not difficult at all Not difficult at all  Some recent data might be hidden    phq 9 is negative   Fall Risk: Fall Risk  03/19/2019 02/27/2019 10/03/2018 08/20/2018 05/24/2018  Falls in the past year? 0 0 0 0 0  Number falls in past yr: 0 0 0 0 0  Injury with Fall? 0 0 0 0 0  Risk Factor Category  - - - - -  Risk for fall due to : - - - - -  Follow up - - - Falls evaluation completed -    Functional Status Survey: Is the patient deaf or have difficulty hearing?: Yes Does the patient have difficulty seeing, even when wearing glasses/contacts?: Yes Does the patient have difficulty  concentrating, remembering, or making decisions?: Yes Does the patient have difficulty walking or climbing stairs?: Yes Does the patient have  difficulty dressing or bathing?: Yes Does the patient have difficulty doing errands alone such as visiting a doctor's office or shopping?: Yes   Assessment & Plan   1. Hospital discharge follow-up   2. Pneumonia due to COVID-19 virus  Breathing well   3. UTI symptoms  - cefTRIAXone (ROCEPHIN) 250 MG injection; Inject 500 mg into the muscle once for 1 dose. FOR IM use in LARGE MUSCLE MASS And 2nd and 3rd days give only 250 mg or one injection  Dispense: 4 each; Refill: 0  4. Mild protein-calorie malnutrition (HCC)  - Nutritional Supplements (ENSURE NUTRITION SHAKE) LIQD; Take 1 each by mouth 2 (two) times daily.  Dispense: 237 mL; Refill: 30  5. Bed sore on hip, left, unstageable (HCC)  - augmented betamethasone dipropionate (DIPROLENE-AF) 0.05 % cream; Apply 1 application topically daily as needed (rash). On bed sores  Dispense: 50 g; Refill: 1  6. Pressure injury of right buttock, stage 1  - augmented betamethasone dipropionate (DIPROLENE-AF) 0.05 % cream; Apply 1 application topically daily as needed (rash). On bed sores  Dispense: 50 g; Refill: 1

## 2019-04-06 IMAGING — US US RENAL
2 series · 13 of 25 positions shown · non-contrast
Comparison: Renal ultrasound September 21, 2017

CLINICAL DATA: History of hydronephrosis and renal stones

EXAM:
RENAL / URINARY TRACT ULTRASOUND COMPLETE

[Series 1: us renal · 11 of 41 slices shown (1 of 2)]
[im 1/41]
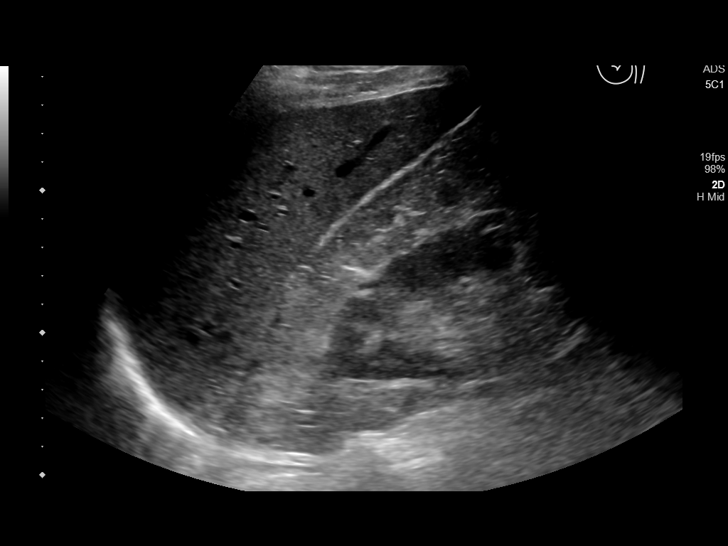
[im 4/41]
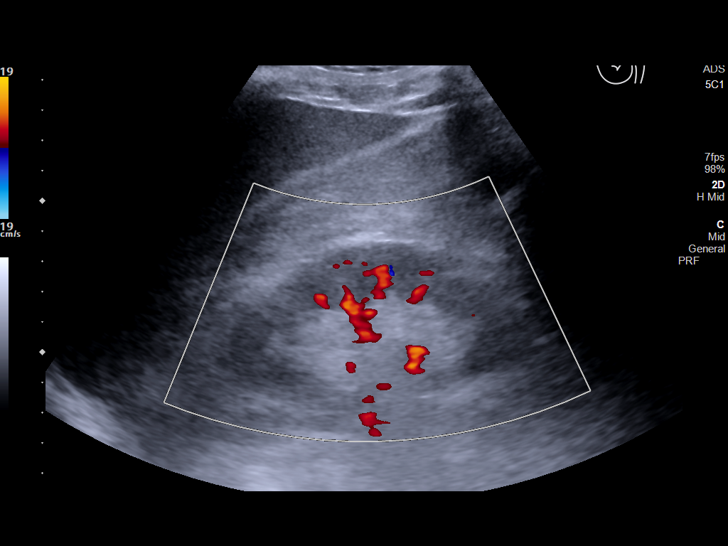
[im 8/41]
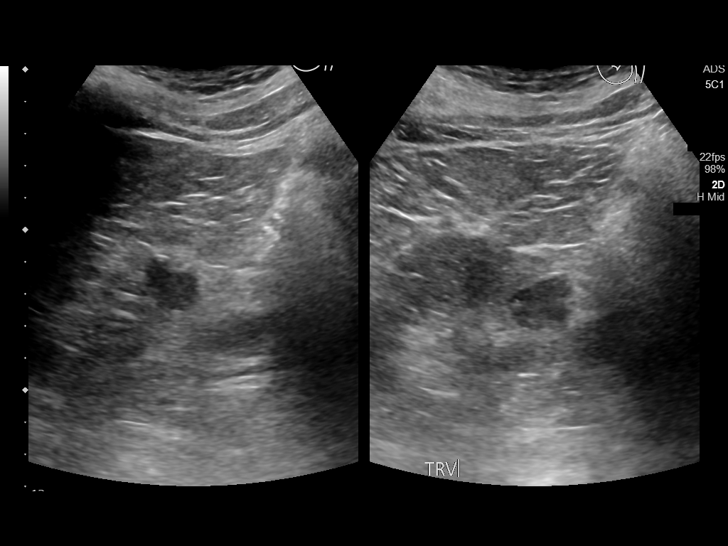
[im 12/41]
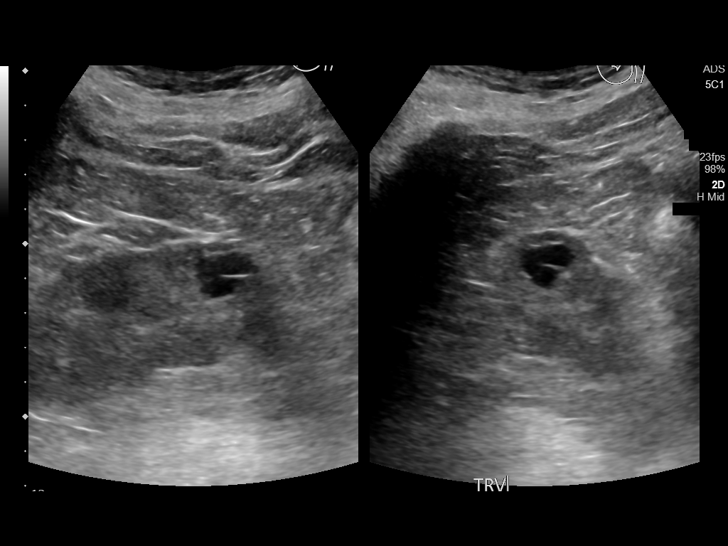
[im 16/41]
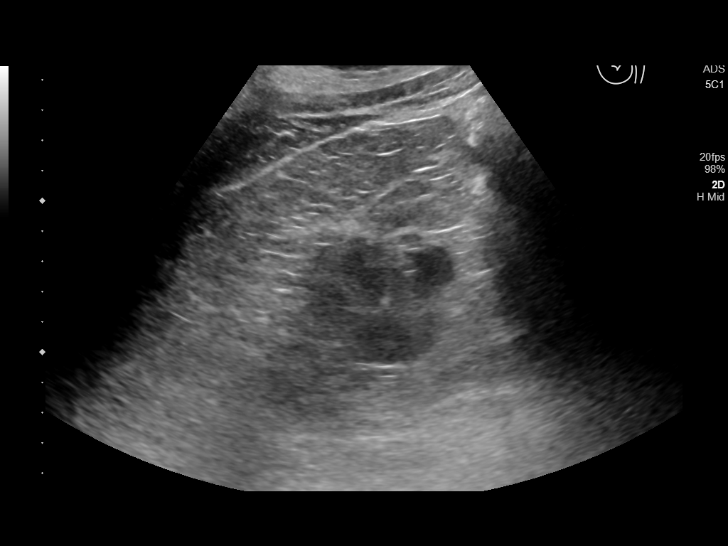
[im 20/41]
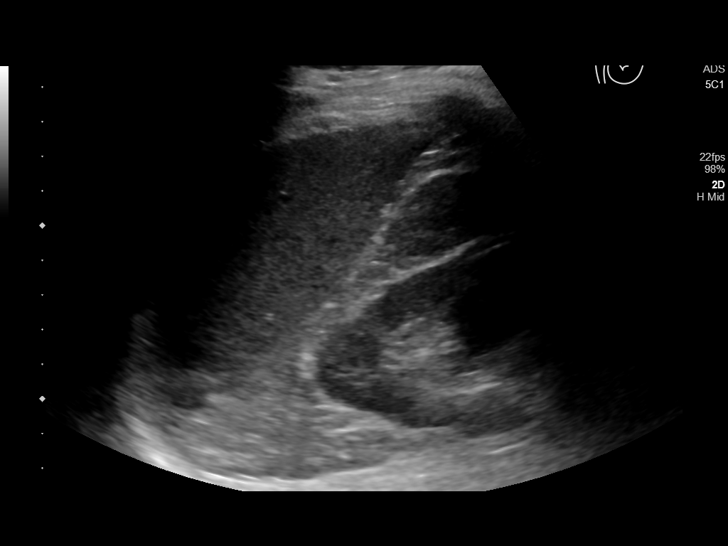
[im 23/41]
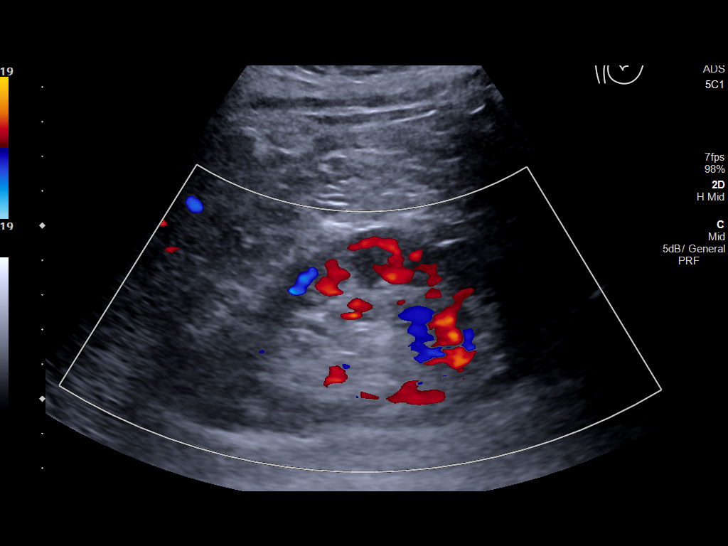
[im 27/41]
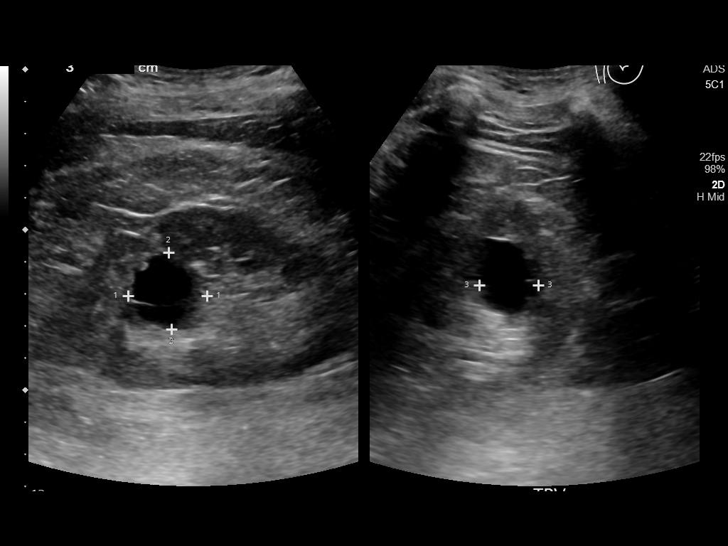
[im 31/41]
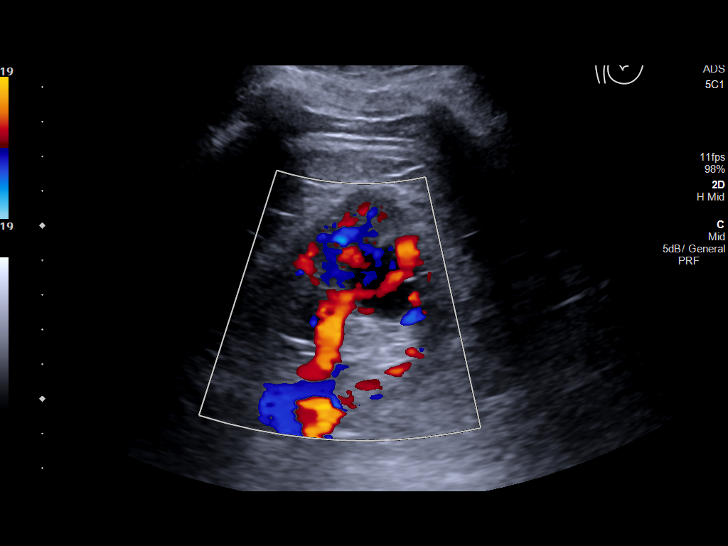
[im 35/41]
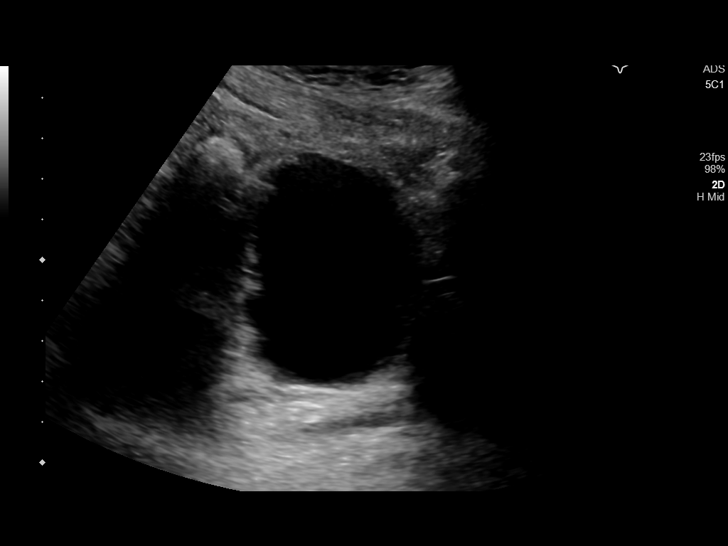
[im 39/41]
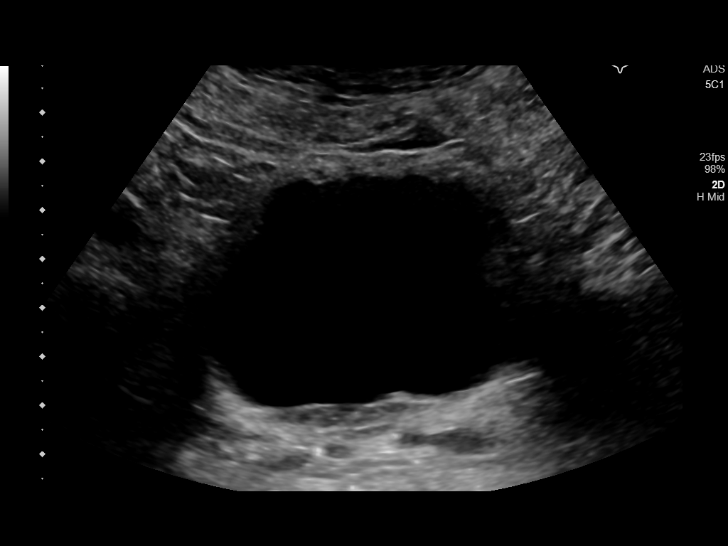

[Series 1001: us renal · 2 of 6 slices shown (2 of 2)]
[im 1/6]
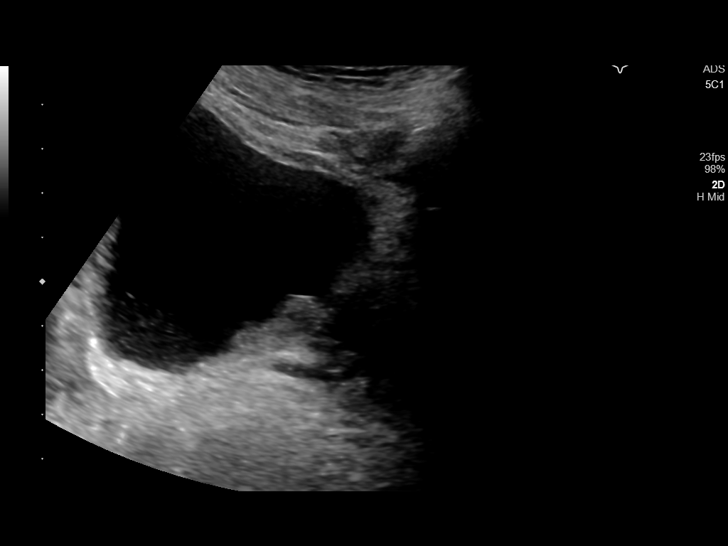
[im 6/6]
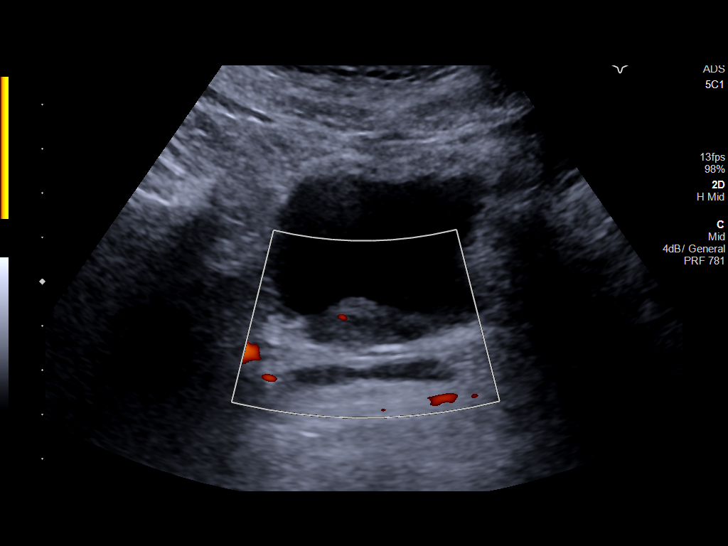

[13 of 25 positions shown; findings below may reference images not displayed]

FINDINGS: Right Kidney:

Length: 10.5 cm. The renal cortical echotexture is approximately
equal to that of the adjacent liver. There is no hydronephrosis.
There are non simple cysts present. In the midpole there is a cyst
measuring 1.6 x 1.7 x 2.1 cm. In the lower pole there is a cyst
measuring 1.9 x 1.3 x 1.6 cm.

Left Kidney:

Length: 11.9 cm. The renal cortical echotexture is similar to that
on the right. There is a septated upper pole cyst measuring 2.5 x
2.4 x 1.8 cm. There is no hydronephrosis.

Bladder:

The bladder is adequately distended. Ureteral jets are observed
bilaterally. There is irregular soft tissue density at the bladder
base which may reflect the enlarged prostate. A bladder mass
measuring 2.3 x 0.7 x 3.2 cm is not excluded. This is not new
however. There is a moderate-sized postvoid residual volume
IMPRESSION: No hydronephrosis. Bilateral mildly complex renal cysts. No discrete
stones are observed today. Mildly increased renal cortical
echotexture bilaterally may reflect medical renal disease.

Moderate-sized postvoid residual urinary bladder volume. Enlarged
prostate gland producing an impression upon the bladder base versus
and in transit bladder wall mass. This is not new.

## 2019-04-06 IMAGING — CR DG ABDOMEN 1V
1 series · 2 of 2 positions shown · non-contrast
Comparison: CT scan of the abdomen and pelvis of July 02, 2017

CLINICAL DATA: History of prostate cancer, hydronephrosis, kidney
stones.

EXAM:
ABDOMEN - 1 VIEW

[Series 1: dg abd 1 view · 0.14mm/px · 2 of 2 slices shown]
[im 1/2]
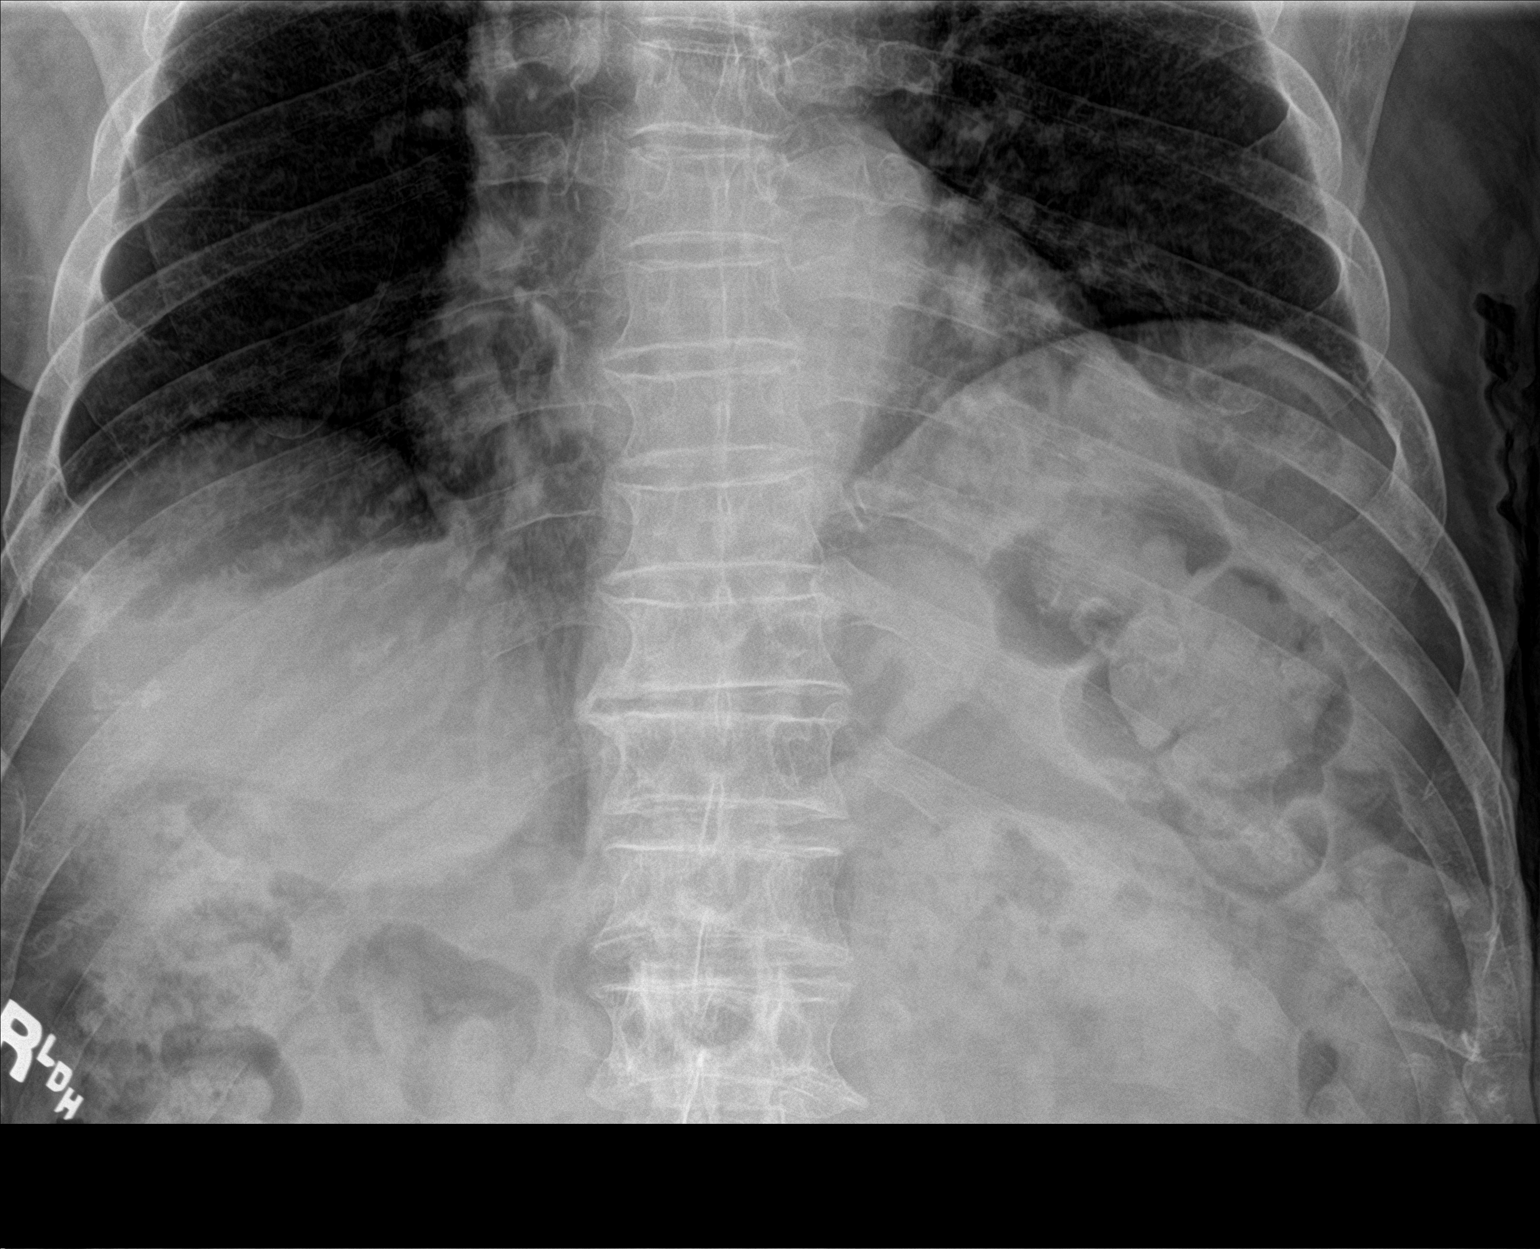
[im 2/2]
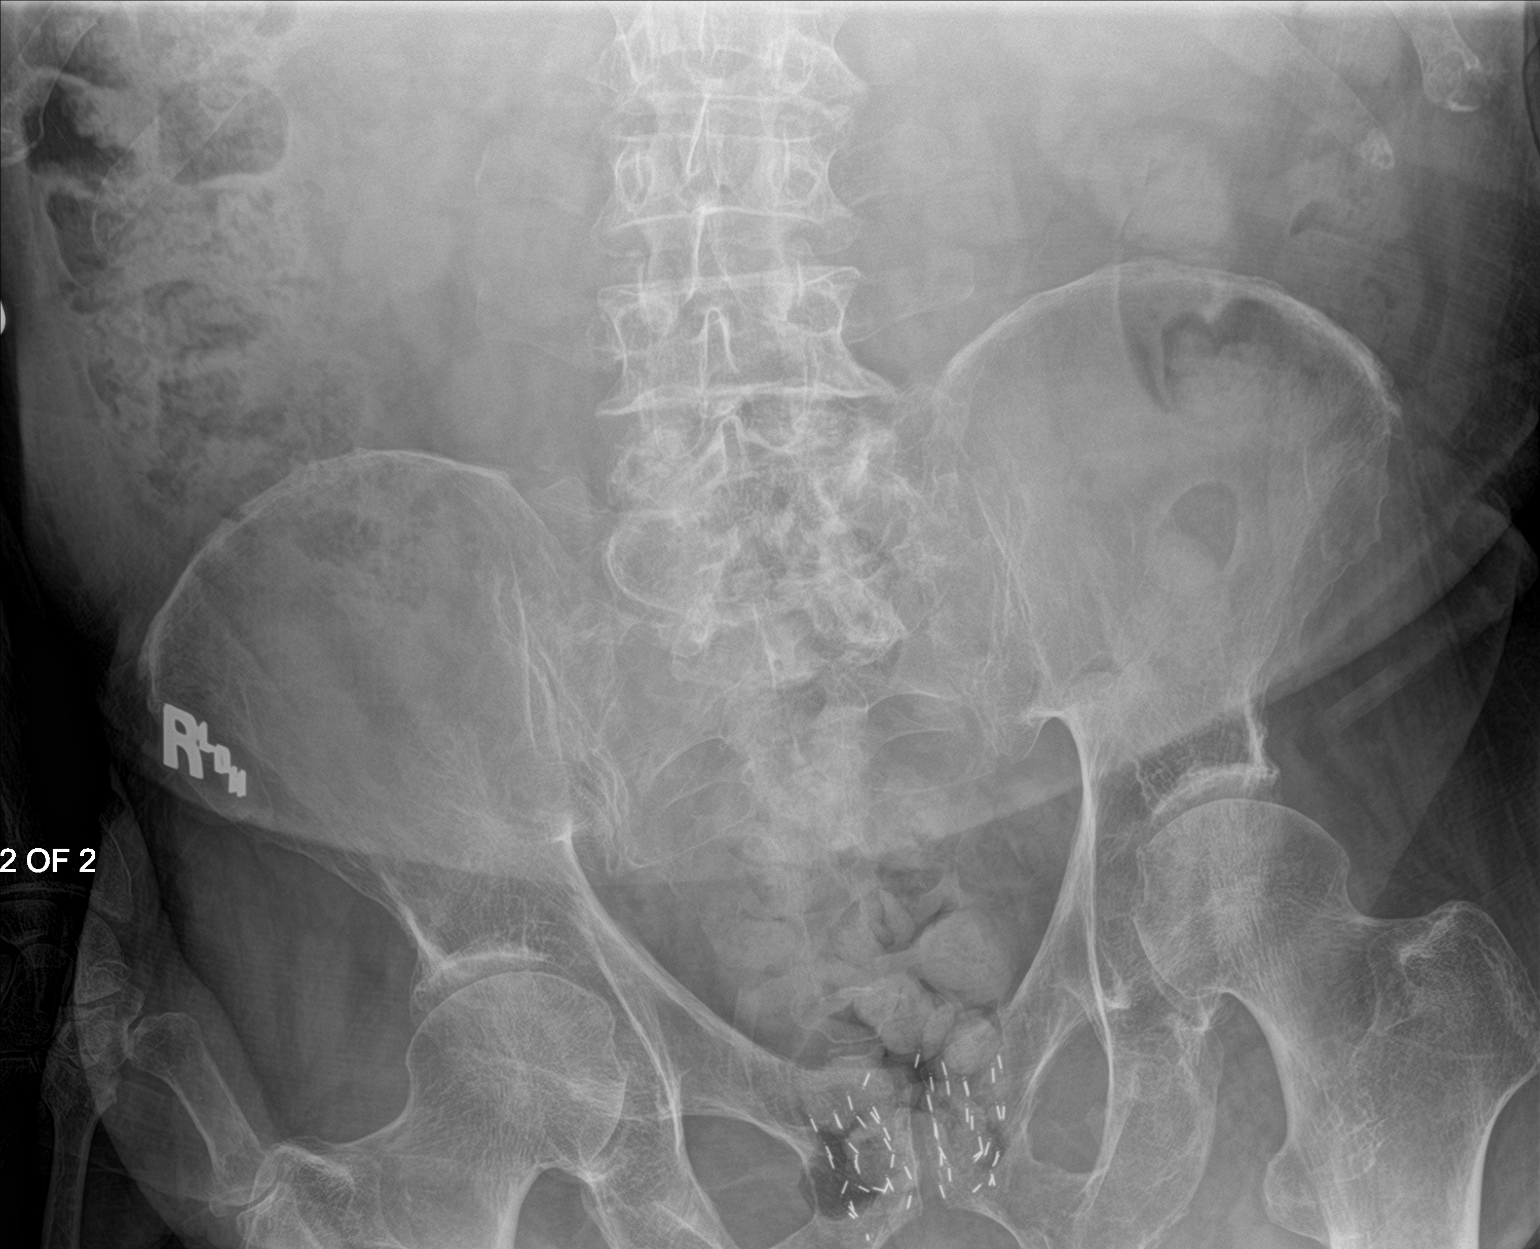

[2 of 2 positions shown; findings below may reference images not displayed]

FINDINGS: The colonic stool burden is moderate. There is no small or large
bowel obstructive pattern. No abnormal calcifications project over
either kidney nor along the course of the ureters nor over the
urinary bladder. There is degenerative change of the lower lumbar
spine. There are mild degenerative changes of both hips. Prostatic
seed implants are present.
IMPRESSION: Moderately increased colonic stool burden may reflect constipation
in the appropriate clinical setting. No calcified urinary tract
stones are observed.

## 2019-04-07 ENCOUNTER — Other Ambulatory Visit: Payer: Self-pay | Admitting: Urology

## 2019-04-07 DIAGNOSIS — Z87442 Personal history of urinary calculi: Secondary | ICD-10-CM

## 2019-04-26 DIAGNOSIS — G809 Cerebral palsy, unspecified: Secondary | ICD-10-CM | POA: Diagnosis not present

## 2019-04-29 ENCOUNTER — Telehealth: Payer: Self-pay | Admitting: Family Medicine

## 2019-04-29 NOTE — Telephone Encounter (Signed)
[name], with [name], calling on behalf of patient to request to switch all of his medications to [facility].

## 2019-04-30 NOTE — Telephone Encounter (Signed)
Spoke with Estill Bamberg and prescriptions have been transferred but she states he only has 11 pills left on his prescriptions. Next appointment is not until May 2021. Please advise.

## 2019-05-26 ENCOUNTER — Other Ambulatory Visit: Payer: Self-pay | Admitting: Family Medicine

## 2019-05-26 DIAGNOSIS — R350 Frequency of micturition: Secondary | ICD-10-CM

## 2019-05-26 DIAGNOSIS — K5909 Other constipation: Secondary | ICD-10-CM

## 2019-05-26 NOTE — Telephone Encounter (Signed)
Appointment -2/8 refilled per protocol

## 2019-05-26 NOTE — Telephone Encounter (Unsigned)
Copied from [place] [phone]. Topic: General - Call Back - No Documentation >> May 26, 2019  1:55 PM Erick Blinks wrote: Reason for CRM: Pharmacy called requesting call back from Clinic regarding Vitamin C, please advise

## 2019-05-27 ENCOUNTER — Encounter: Payer: Self-pay | Admitting: Intensive Care

## 2019-05-27 ENCOUNTER — Emergency Department
Admission: EM | Admit: 2019-05-27 | Discharge: 2019-05-27 | Payer: Medicare Other | Attending: Emergency Medicine | Admitting: Emergency Medicine

## 2019-05-27 ENCOUNTER — Other Ambulatory Visit: Payer: Self-pay

## 2019-05-27 ENCOUNTER — Emergency Department: Payer: Medicare Other

## 2019-05-27 ENCOUNTER — Other Ambulatory Visit: Payer: Self-pay | Admitting: Family Medicine

## 2019-05-27 DIAGNOSIS — Y92198 Other place in other specified residential institution as the place of occurrence of the external cause: Secondary | ICD-10-CM | POA: Diagnosis not present

## 2019-05-27 DIAGNOSIS — Z79899 Other long term (current) drug therapy: Secondary | ICD-10-CM | POA: Diagnosis not present

## 2019-05-27 DIAGNOSIS — R519 Headache, unspecified: Secondary | ICD-10-CM | POA: Diagnosis not present

## 2019-05-27 DIAGNOSIS — N390 Urinary tract infection, site not specified: Secondary | ICD-10-CM

## 2019-05-27 DIAGNOSIS — R350 Frequency of micturition: Secondary | ICD-10-CM | POA: Diagnosis not present

## 2019-05-27 DIAGNOSIS — G809 Cerebral palsy, unspecified: Secondary | ICD-10-CM | POA: Insufficient documentation

## 2019-05-27 DIAGNOSIS — I739 Peripheral vascular disease, unspecified: Secondary | ICD-10-CM | POA: Diagnosis not present

## 2019-05-27 DIAGNOSIS — S0083XA Contusion of other part of head, initial encounter: Secondary | ICD-10-CM

## 2019-05-27 DIAGNOSIS — Y999 Unspecified external cause status: Secondary | ICD-10-CM | POA: Diagnosis not present

## 2019-05-27 DIAGNOSIS — Y9389 Activity, other specified: Secondary | ICD-10-CM | POA: Insufficient documentation

## 2019-05-27 DIAGNOSIS — S0990XA Unspecified injury of head, initial encounter: Secondary | ICD-10-CM | POA: Diagnosis not present

## 2019-05-27 DIAGNOSIS — R220 Localized swelling, mass and lump, head: Secondary | ICD-10-CM | POA: Diagnosis not present

## 2019-05-27 DIAGNOSIS — I4891 Unspecified atrial fibrillation: Secondary | ICD-10-CM | POA: Diagnosis not present

## 2019-05-27 DIAGNOSIS — S0993XA Unspecified injury of face, initial encounter: Secondary | ICD-10-CM | POA: Diagnosis not present

## 2019-05-27 DIAGNOSIS — W1839XA Other fall on same level, initial encounter: Secondary | ICD-10-CM | POA: Insufficient documentation

## 2019-05-27 LAB — URINALYSIS, COMPLETE (UACMP) WITH MICROSCOPIC
Bilirubin Urine: NEGATIVE
Glucose, UA: NEGATIVE mg/dL
Hgb urine dipstick: NEGATIVE
Ketones, ur: NEGATIVE mg/dL
Nitrite: NEGATIVE
Protein, ur: 30 mg/dL — AB
Specific Gravity, Urine: 1.019 (ref 1.005–1.030)
WBC, UA: >50 WBC/hpf — ABNORMAL HIGH (ref 0–5)
pH: 7 (ref 5.0–8.0)

## 2019-05-27 MED ORDER — CEPHALEXIN 500 MG PO CAPS: 500.0000 mg | ORAL_CAPSULE | Freq: Two times a day (BID) | ORAL | 0 refills | Status: AC

## 2019-05-27 MED ORDER — ASCORBIC ACID 500 MG PO TABS: 500.0000 mg | ORAL_TABLET | Freq: Every day | ORAL | 5 refills | Status: DC

## 2019-05-27 MED ORDER — CEPHALEXIN 500 MG PO CAPS
500.0000 mg | ORAL_CAPSULE | Freq: Once | ORAL | Status: AC
  Administered 2019-05-27: 500 mg via ORAL
  Filled 2019-05-27: qty 1

## 2019-05-27 NOTE — ED Notes (Signed)
Attempted to notify pt's legal guardian Bretta Bang unable to be notified at this time. This RN spoke with nurses at [facility] to notify them that the pt was being discharged.

## 2019-05-27 NOTE — ED Notes (Signed)
[facility] updated on pt status [phone].

## 2019-05-27 NOTE — ED Provider Notes (Signed)
[facility] Emergency Department Provider Note ____________________________________________   First MD Initiated Contact with Patient 05/27/19 1844     (approximate)  I have reviewed the triage vital signs and the nursing notes.   HISTORY  Chief Complaint Fall  Level 5 caveat: History of present illness limited due to cerebral palsy  HPI [name] is a 79 y.o. male with PMH as noted below who presents from his facility after an unwitnessed fall 2 days ago.  Per the caregiver, he fell from standing height just after another caregiver stepped out of the room.  He was found immediately, and had no LOC.  Since then he has had significant bruising to his face which concerned facility staff.  Per the caregiver, the patient's behavior and mental status has been at his baseline since the event.  In addition she reports that the patient's urine has been foul-smelling and the staff is concerned about a UTI.   Past Medical History:  Diagnosis Date  . Anxiety   . Atrophy, disuse, muscle   . BPH (benign prostatic hyperplasia)   . Cerebral palsy ([place])   . Eczema   . Elevated lipids   . Elevated PSA   . Exotropia   . Hematuria, gross   . Hyperlipidemia   . Incontinence   . Kidney stone   . Lower extremity edema    lymph drainage problems  . Lower urinary tract symptoms   . Lymphedema   . Mental retardation   . Over weight   . Prostate cancer ([place])   . Urinary frequency   . Urinary urgency   . UTI (urinary tract infection)   . Venous insufficiency   . Vitamin D deficiency   . Wheelchair dependence     Patient Active Problem List   Diagnosis Date Noted  . Pressure injury of skin 03/12/2019  . Moderate protein malnutrition ([place]) 03/11/2019  . Pancytopenia ([place]) 03/04/2019  . Renal mass 07/23/2017  . Ureteral calculus 07/23/2017  . PAD (peripheral artery disease) ([place]) 07/04/2017  . Aortic valve calcification 07/04/2017  . Coronary artery  calcification 07/04/2017  . Hypertension 10/26/2016  . Late effect acute polio 05/17/2016  . Chronic constipation 11/19/2015  . Mild major depression ([place]) 11/19/2015  . Complicated grieving 0000000  . Recurrent UTI 09/11/2015  . Adenocarcinoma of prostate ([place]) 03/02/2015  . Amyotrophia 03/02/2015  . Edema leg 03/02/2015  . Cerebral palsy ([place]) 03/02/2015  . Dyslipidemia 03/02/2015  . Dermatitis, eczematoid 03/02/2015  . 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 H/O acute poliomyelitis 03/02/2015  . Lymphedema 03/02/2015  . Intellectual disability 03/02/2015  . Hypertrophy of nail 03/02/2015  . Adiposity 03/02/2015  . Absence of bladder continence 03/02/2015  . Chronic venous insufficiency 03/02/2015  . Avitaminosis D 03/02/2015  . Adynamia 03/02/2015  . Dependent on wheelchair 03/02/2015  . Kidney cysts 03/02/2015  . At risk for falling 03/02/2015  . Cerebral vascular disease 03/02/2015  . Strabismus 02/11/2015  . Urge incontinence of urine 02/11/2015  . Wheelchair dependence 02/11/2015  . Obesity 02/11/2015  . Urinary frequency 10/30/2014    Past Surgical History:  Procedure Laterality Date  . CYSTOSCOPY/URETEROSCOPY/HOLMIUM LASER/STENT PLACEMENT Right 08/14/2017   Procedure: CYSTOSCOPY/URETEROSCOPY/HOLMIUM LASER/STENT PLACEMENT;  Surgeon: Abbie Sons, MD;  Location: [facility] ORS;  Service: Urology;  Laterality: Right;  . leg circulation surgery Right   . PROSTATE SURGERY  11/10/2008   BRACHYTHERAPY    Prior to Admission medications   Medication Sig Start Date End Date Taking?  Authorizing Provider  acetaminophen (TYLENOL) 325 MG tablet Take 2 tablets (650 mg total) by mouth every 6 (six) hours as needed for mild pain or headache (fever >/= 101). 03/14/19   Cherene Altes, MD  alfuzosin (UROXATRAL) 10 MG 24 hr tablet TAKE 1 TABLET BY MOUTH ONCE DAILY 05/26/19   Ancil Boozer, Drue Stager, MD  ALPRAZolam (XANAX) 0.25 MG tablet TAKE ONE TABLET BY MOUTH 2 TIMES A DAY AS NEEDED FOR  ANXIETY (WHEN BLOOD PRESSURE IS VERY HIGH OR ANXIOUS) Patient taking differently: Take 0.25 mg by mouth 2 (two) times daily as needed for anxiety. TAKE ONE TABLET BY MOUTH 2 TIMES A DAY AS NEEDED FOR ANXIETY (WHEN BLOOD PRESSURE IS VERY HIGH OR ANXIOUS) 07/05/17   Sowles, Drue Stager, MD  AMITIZA 24 MCG capsule TAKE 1 CAPSULE BY MOUTH DAILY WITH A MEAL 05/26/19   Ancil Boozer, Drue Stager, MD  ARTIFICIAL TEAR SOLUTION OP Apply 2 drops to eye 2 (two) times daily as needed (dry eyes).    [provider]  ascorbic acid (VITAMIN C) 500 MG tablet Take 1 tablet (500 mg total) by mouth daily. 05/27/19   Steele Sizer, MD  aspirin (ASPIR-LOW) 81 MG EC tablet TAKE 1 TABLET BY MOUTH ONCE DAILY 05/26/19   Ancil Boozer, Drue Stager, MD  atorvastatin (LIPITOR) 40 MG tablet TAKE 1 TABLET BY MOUTH EVERY EVENING 05/26/19   Ancil Boozer, Drue Stager, MD  augmented betamethasone dipropionate (DIPROLENE-AF) 0.05 % cream Apply 1 application topically daily as needed (rash). On bed sores 03/19/19   Steele Sizer, MD  Calcium Carb-Cholecalciferol (CALCIUM-VITAMIN D) 500-400 MG-UNIT TABS Take 1 tablet by mouth 2 (two) times daily. TAKE ONE TABLET BY MOUTH 2 TIMES A DAY 05/26/19   Sowles, Drue Stager, MD  cephALEXin (KEFLEX) 500 MG capsule Take 1 capsule (500 mg total) by mouth 2 (two) times daily for 14 days. 05/27/19 06/10/19  Arta Silence, MD  Cranberry Juice Powder 425 MG CAPS TAKE 1 CAPSULE BY MOUTH ONCE DAILY 05/26/19   Steele Sizer, MD  Elastic Bandages & Supports (MEDICAL COMPRESSION STOCKINGS) MISC 1 Units by Does not apply route daily. 07/19/15   Steele Sizer, MD  Incontinence Supply Disposable (DEPEND SILHOUETTE BRIEFS L/XL) MISC 1 each by Does not apply route 2 (two) times daily as needed. 11/30/17   Steele Sizer, MD  Nutritional Supplements (ENSURE NUTRITION SHAKE) LIQD Take 1 each by mouth 2 (two) times daily. 03/19/19   Steele Sizer, MD  Polyethyl Glycol-Propyl Glycol (SYSTANE ULTRA) 0.4-0.3 % SOLN Apply 1 drop to eye 2 (two) times  daily. Both eyes    [provider]  polyethylene glycol (MIRALAX / GLYCOLAX) 17 g packet Take 17 g by mouth daily. With 4 oz of juice or water    [provider]  sertraline (ZOLOFT) 100 MG tablet TAKE 1 TABLET BY MOUTH ONCE DAILY 05/26/19   Steele Sizer, MD    Allergies Patient has no known allergies.  Family History  Problem Relation Age of Onset  . Heart attack Brother   . Heart disease Other     Social History Social History   Tobacco Use  . Smoking status: Never Smoker  . Smokeless tobacco: Never Used  Substance Use Topics  . Alcohol use: No    Alcohol/week: 0.0 standard drinks  . Drug use: No    Review of Systems Level 5 caveat: Unable to obtain review of systems due to cerebral palsy    ____________________________________________   PHYSICAL EXAM:  VITAL SIGNS: ED Triage Vitals  Enc Vitals Group  BP 05/27/19 1810 112/69     Pulse Rate 05/27/19 1810 65     Resp 05/27/19 1810 16     Temp 05/27/19 1810 98.1 F (36.7 C)     Temp Source 05/27/19 1810 Axillary     SpO2 05/27/19 1810 97 %     Weight 05/27/19 1811 180 lb (81.6 kg)     Height 05/27/19 1811 5\' 9"  (1.753 m)     Head Circumference --      Peak Flow --      Pain Score 05/27/19 1811 0     Pain Loc --      Pain Edu? --      Excl. in [place]? --     Constitutional: Alert, answering some questions.  Comfortable appearing and in no acute distress. Eyes: Conjunctivae are normal.  EOMI.  PERRLA. Head: Bruising to the left forehead and periorbital region. Nose: No congestion/rhinnorhea. Mouth/Throat: Mucous membranes are moist.   Neck: Normal range of motion.  No midline cervical spinal tenderness. Cardiovascular: Normal rate, regular rhythm. Good peripheral circulation. Respiratory: Normal respiratory effort.  No retractions.  Gastrointestinal: No distention.  Genitourinary: No CVA tenderness. Musculoskeletal: No lower extremity edema.  Extremities warm and well perfused.  No  midline spinal tenderness. Neurologic: Motor intact in all extremities. Skin:  Skin is warm and dry. No rash noted. Psychiatric: Calm and cooperative.  ____________________________________________   LABS (all labs ordered are listed, but only abnormal results are displayed)  Labs Reviewed  URINALYSIS, COMPLETE (UACMP) WITH MICROSCOPIC - Abnormal; Notable for the following components:      Result Value   Color, Urine YELLOW (*)    APPearance TURBID (*)    Protein, ur 30 (*)    Leukocytes,Ua MODERATE (*)    WBC, UA >50 (*)    Bacteria, UA MANY (*)    All other components within normal limits   ____________________________________________  EKG  ED ECG REPORT I, Arta Silence, the attending physician, personally viewed and interpreted this ECG.  Date: 05/27/2019 EKG Time: 1844 Rate: 126 Rhythm: Sinus rhythm (incorrectly recorded by machine as atrial fibrillation likely due to poor quality baseline) QRS Axis: normal Intervals: Prolonged QT ST/T Wave abnormalities: Nonspecific ST abnormalities Narrative Interpretation: nonspecific abnormalities with no evidence of acute ischemia; interpretation limited by poor quality baseline  ____________________________________________  RADIOLOGY  CT head: No ICH or other acute abnormality CT maxillofacial: No fracture  ____________________________________________   PROCEDURES  Procedure(s) performed: No  Procedures  Critical Care performed: No ____________________________________________   INITIAL IMPRESSION / ASSESSMENT AND PLAN / ED COURSE  Pertinent labs & imaging results that were available during my care of the patient were reviewed by me and considered in my medical decision making (see chart for details).  79 year old male with cerebral palsy and other PMH as noted above presents from his facility after an unwitnessed fall 2 days ago.  He apparently was found immediately and had no LOC.  His mental status has  been at its baseline since that time.  His only other acute issue is that facility staff has noted foul-smelling urine.  On exam, the patient is alert and responsive.  Neurologic exam is nonfocal.  He does have some bruising to the left forehead and periorbital area but extraocular movements are intact and there is no other significant trauma.  Given the patient's age, the fact that he is on aspirin, and inability to get a reliable neurologic exam I will obtain a CT head as well as  a CT of the facial bones.  In addition, will obtain a UA.  At this time, there is no indication for lab work-up.  ----------------------------------------- 12:03 AM on 05/28/2019 -----------------------------------------  CTs were negative for acute traumatic findings.  UA was consistent with UTI.  The patient is stable for outpatient management.  I gave the caregiver thorough return precautions and she expressed understanding.   ____________________________________________   FINAL CLINICAL IMPRESSION(S) / ED DIAGNOSES  Final diagnoses:  Contusion of face, initial encounter  Urinary tract infection without hematuria, site unspecified      NEW MEDICATIONS STARTED DURING THIS VISIT:  Discharge Medication List as of 05/27/2019  9:45 PM    START taking these medications   Details  cephALEXin (KEFLEX) 500 MG capsule Take 1 capsule (500 mg total) by mouth 2 (two) times daily for 14 days., Starting Tue 05/27/2019, Until Tue 06/10/2019, Print         Note:  This document was prepared using Dragon voice recognition software and may include unintentional dictation errors.    Arta Silence, MD 05/28/19 [phone]

## 2019-05-27 NOTE — ED Notes (Signed)
Pt transferred to wheelchair using lift with assistance of Butch RN. Pt and care giver verbalized discharge instructions without questions.

## 2019-05-27 NOTE — ED Triage Notes (Signed)
Patient presents with care giver from [facility] for fall on Sunday. Hit forehead. Denies LOC. Caregiver reports they came in today because bruising around face has gotten worse. Bruising present around both eyes and on forehead. Hematoma also present on forehead. Patient wheelchair bound. Caregiver reports patient has been acting himself since fall.

## 2019-05-27 NOTE — Discharge Instructions (Signed)
[name] urine shows findings suggesting a UTI.  He should take the full 2-week course of the Keflex.  His CT scans do not show any facial fractures or severe head injury.  He should return to the ER for any worsening weakness, fever, change in mental status, persistent vomiting, or any other new or worsening symptoms that are concerning.

## 2019-05-27 NOTE — Telephone Encounter (Signed)
[facility] is called to get clarification on Vit C. Is the medication d/c. The message states the medication is not approiate at this time? Please advise Cb- [phone]

## 2019-05-28 ENCOUNTER — Telehealth: Payer: Self-pay

## 2019-05-28 NOTE — Telephone Encounter (Signed)
Copied from [place] [id number]. Topic: General - Inquiry >> May 28, 2019 11:34 AM Mathis Bud wrote: Reason for CRM: Estill Bamberg from [facility] called regarding medication ascorbic acid (VITAMIN C) 500 MG tablet  Estill Bamberg would like nurse to call back to go over directions of medication. Pharmacy said they got two separate request.

## 2019-06-02 ENCOUNTER — Ambulatory Visit (INDEPENDENT_AMBULATORY_CARE_PROVIDER_SITE_OTHER): Payer: Medicare Other | Admitting: Family Medicine

## 2019-06-02 ENCOUNTER — Encounter: Payer: Self-pay | Admitting: Family Medicine

## 2019-06-02 ENCOUNTER — Other Ambulatory Visit: Payer: Self-pay

## 2019-06-02 VITALS — BP 129/71 | HR 60

## 2019-06-02 DIAGNOSIS — G8929 Other chronic pain: Secondary | ICD-10-CM

## 2019-06-02 DIAGNOSIS — F419 Anxiety disorder, unspecified: Secondary | ICD-10-CM

## 2019-06-02 DIAGNOSIS — N390 Urinary tract infection, site not specified: Secondary | ICD-10-CM

## 2019-06-02 DIAGNOSIS — R2681 Unsteadiness on feet: Secondary | ICD-10-CM

## 2019-06-02 DIAGNOSIS — K5909 Other constipation: Secondary | ICD-10-CM

## 2019-06-02 DIAGNOSIS — R600 Localized edema: Secondary | ICD-10-CM | POA: Diagnosis not present

## 2019-06-02 DIAGNOSIS — E441 Mild protein-calorie malnutrition: Secondary | ICD-10-CM

## 2019-06-02 DIAGNOSIS — N39498 Other specified urinary incontinence: Secondary | ICD-10-CM | POA: Diagnosis not present

## 2019-06-02 DIAGNOSIS — H04123 Dry eye syndrome of bilateral lacrimal glands: Secondary | ICD-10-CM

## 2019-06-02 DIAGNOSIS — G809 Cerebral palsy, unspecified: Secondary | ICD-10-CM

## 2019-06-02 DIAGNOSIS — I700 Atherosclerosis of aorta: Secondary | ICD-10-CM

## 2019-06-02 DIAGNOSIS — F320 Major depressive disorder, single episode, mild: Secondary | ICD-10-CM

## 2019-06-02 DIAGNOSIS — R350 Frequency of micturition: Secondary | ICD-10-CM

## 2019-06-02 DIAGNOSIS — M25561 Pain in right knee: Secondary | ICD-10-CM

## 2019-06-02 MED ORDER — ATORVASTATIN CALCIUM 40 MG PO TABS: 40.0000 mg | ORAL_TABLET | Freq: Every evening | ORAL | 5 refills | Status: DC

## 2019-06-02 MED ORDER — BUSPIRONE HCL 5 MG PO TABS: 5.0000 mg | ORAL_TABLET | Freq: Two times a day (BID) | ORAL | 0 refills | Status: DC

## 2019-06-02 MED ORDER — SYSTANE ULTRA 0.4-0.3 % OP SOLN: 1.0000 [drp] | Freq: Two times a day (BID) | OPHTHALMIC | 5 refills | Status: DC

## 2019-06-02 MED ORDER — ASPIRIN 81 MG PO TBEC: DELAYED_RELEASE_TABLET | ORAL | 5 refills | Status: DC

## 2019-06-02 MED ORDER — ALPRAZOLAM 0.25 MG PO TABS: ORAL_TABLET | ORAL | 0 refills | Status: DC

## 2019-06-02 MED ORDER — CRANBERRY JUICE POWDER 425 MG PO CAPS: 1.0000 | ORAL_CAPSULE | Freq: Every day | ORAL | 5 refills | Status: DC

## 2019-06-02 MED ORDER — LUBIPROSTONE 24 MCG PO CAPS: 24.0000 ug | ORAL_CAPSULE | Freq: Two times a day (BID) | ORAL | 5 refills | Status: DC

## 2019-06-02 MED ORDER — ENSURE NUTRITION SHAKE PO LIQD: 1.0000 | Freq: Two times a day (BID) | ORAL | 30 refills | Status: DC

## 2019-06-02 MED ORDER — DEPEND SILHOUETTE BRIEFS L/XL MISC: 1.0000 | Freq: Two times a day (BID) | 5 refills | Status: DC | PRN

## 2019-06-02 MED ORDER — CALCIUM-VITAMIN D 500-400 MG-UNIT PO TABS: 1.0000 | ORAL_TABLET | Freq: Two times a day (BID) | ORAL | 5 refills | Status: DC

## 2019-06-02 MED ORDER — ALFUZOSIN HCL ER 10 MG PO TB24: 10.0000 mg | ORAL_TABLET | Freq: Every day | ORAL | 5 refills | Status: DC

## 2019-06-02 MED ORDER — SERTRALINE HCL 100 MG PO TABS: 100.0000 mg | ORAL_TABLET | Freq: Every day | ORAL | 5 refills | Status: DC

## 2019-06-02 MED ORDER — ACETAMINOPHEN 325 MG PO TABS: 650.0000 mg | ORAL_TABLET | Freq: Four times a day (QID) | ORAL | 1 refills | Status: DC | PRN

## 2019-06-02 MED ORDER — MEDICAL COMPRESSION STOCKINGS MISC: 1.0000 [IU] | Freq: Every day | 0 refills | Status: DC

## 2019-06-02 NOTE — Progress Notes (Signed)
Name: [name]   MRN: [id number]    DOB: [birth year]   Date:06/02/2019       Progress Note  Subjective  Chief Complaint  Chief Complaint  Patient presents with  . Follow-up    Transitioned to another part of [name] and he has to have a follow up appointment.  . Medication Refill    Wants to know if patient is still on Xanax or not. No medication came with him when he transfered.  . Form Completion    Please fax over AVS to [phone]. Attention KEN.    I connected with  [name]  on 06/02/19 at  8:40 AM EST by a video enabled telemedicine application and verified that I am speaking with the correct person using two identifiers.  I discussed the limitations of evaluation and management by telemedicine and the availability of in person appointments. The patient expressed understanding and agreed to proceed. Staff also discussed with the patient that there may be a patient responsible charge related to this service. Patient Location: at [facility] at [facility] group home facility  Provider Location: [facility] Additional Individuals present: [name]   HPI  Guardian is his nephew: Samrat Sealey # [phone] and [name] his niece   UTI: he went to [facility] on [facility]/2019 because of fall at home, and urine was strong and had an odor. He was given Cephalexin for 14 days and caregiver states urine smell has improved and also not as dark. No fever or chills.   He has MR, poor historian.He has CP, he has been getting progressively weaker on his legs and is requiring more assistance, he will be moving on Nov 12 th, to [facility] for higher level care through [place] leg edema/PA: since he is at the new facility since 05/29/2019, he has not been using his pump at night and also not the compression stocking hoses. We will send another compression stocking hoses prescriptions, and advised to get the pump from previous facility   Atherosclerosis  aorta, and coronary arteries with also calcified aortic valve, he does not seem to have chest pain, buthe is on medical management, taking aspirin and atorvastatin Unchanged  Depression: he is taking Zoloft daily, cooperative, caregiver states he has been a little agitated at night, we will add Buspar to use prn, he has been calling names, likely from new environment , higher level of care.   History of kidney stones: seeing Dr. Bernardo Heater and had a cystoscopy, right ureteroscopy and stone removaland stent placement, urinary incontinence has improved, he still has depends  Right knee pain: he complains of pain on right knee, he has intermittent swelling, no redness or increase in warmth. Discussed PT, but they will try Tylenol first    Prostate cancer: had brachytherapy in 2010 , was seeing Dr. Jacqlyn Larsen, currently under the care of Dr. Bernardo Heater. Unchanged  Constipation chronic: doing well, no straining, we will continue Amitiza and stop miralax for now and monitor  Leucopenia and thrombocytopenia: seeing Dr. Janese Banks and getting monitored, last platelets 136, no easy bruising , unchanged   Patient Active Problem List   Diagnosis Date Noted  . Pressure injury of skin 03/12/2019  . Moderate protein malnutrition ([place]) 03/11/2019  . Pancytopenia ([place]) 03/04/2019  . Renal mass 07/23/2017  . Ureteral calculus 07/23/2017  . PAD (peripheral artery disease) ([place]) 07/04/2017  . Aortic valve calcification 07/04/2017  . Coronary artery calcification 07/04/2017  . Hypertension 10/26/2016  .  Late effect acute polio 05/17/2016  . Chronic constipation 11/19/2015  . Mild major depression ([place]) 11/19/2015  . Complicated grieving 0000000  . Recurrent UTI 09/11/2015  . Adenocarcinoma of prostate ([place]) 03/02/2015  . Amyotrophia 03/02/2015  . Edema leg 03/02/2015  . Cerebral palsy ([place]) 03/02/2015  . Dyslipidemia 03/02/2015  . Dermatitis, eczematoid 03/02/2015  . 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 H/O acute  poliomyelitis 03/02/2015  . Lymphedema 03/02/2015  . Intellectual disability 03/02/2015  . Hypertrophy of nail 03/02/2015  . Adiposity 03/02/2015  . Absence of bladder continence 03/02/2015  . Chronic venous insufficiency 03/02/2015  . Avitaminosis D 03/02/2015  . Adynamia 03/02/2015  . Dependent on wheelchair 03/02/2015  . Kidney cysts 03/02/2015  . At risk for falling 03/02/2015  . Cerebral vascular disease 03/02/2015  . Strabismus 02/11/2015  . Urge incontinence of urine 02/11/2015  . Wheelchair dependence 02/11/2015  . Obesity 02/11/2015  . Urinary frequency 10/30/2014    Past Surgical History:  Procedure Laterality Date  . CYSTOSCOPY/URETEROSCOPY/HOLMIUM LASER/STENT PLACEMENT Right 08/14/2017   Procedure: CYSTOSCOPY/URETEROSCOPY/HOLMIUM LASER/STENT PLACEMENT;  Surgeon: Abbie Sons, MD;  Location: [facility] ORS;  Service: Urology;  Laterality: Right;  . leg circulation surgery Right   . PROSTATE SURGERY  11/10/2008   BRACHYTHERAPY    Family History  Problem Relation Age of Onset  . Heart attack Brother   . Heart disease Other      Current Outpatient Medications:  .  acetaminophen (TYLENOL) 325 MG tablet, Take 2 tablets (650 mg total) by mouth every 6 (six) hours as needed for mild pain or headache (fever >/= 101)., Disp:  , Rfl:  .  alfuzosin (UROXATRAL) 10 MG 24 hr tablet, TAKE 1 TABLET BY MOUTH ONCE DAILY, Disp: 30 tablet, Rfl: 0 .  AMITIZA 24 MCG capsule, TAKE 1 CAPSULE BY MOUTH DAILY WITH A MEAL, Disp: 60 capsule, Rfl: 0 .  ARTIFICIAL TEAR SOLUTION OP, Apply 2 drops to eye 2 (two) times daily as needed (dry eyes)., Disp: , Rfl:  .  ascorbic acid (VITAMIN C) 500 MG tablet, Take 1 tablet (500 mg total) by mouth daily., Disp: 30 tablet, Rfl: 5 .  aspirin (ASPIR-LOW) 81 MG EC tablet, TAKE 1 TABLET BY MOUTH ONCE DAILY, Disp: 30 tablet, Rfl: 0 .  atorvastatin (LIPITOR) 40 MG tablet, TAKE 1 TABLET BY MOUTH EVERY EVENING, Disp: 30 tablet, Rfl: 0 .  augmented betamethasone  dipropionate (DIPROLENE-AF) 0.05 % cream, Apply 1 application topically daily as needed (rash). On bed sores, Disp: 50 g, Rfl: 1 .  Calcium Carb-Cholecalciferol (CALCIUM-VITAMIN D) 500-400 MG-UNIT TABS, Take 1 tablet by mouth 2 (two) times daily. TAKE ONE TABLET BY MOUTH 2 TIMES A DAY, Disp: 60 tablet, Rfl: 0 .  cephALEXin (KEFLEX) 500 MG capsule, Take 1 capsule (500 mg total) by mouth 2 (two) times daily for 14 days., Disp: 28 capsule, Rfl: 0 .  Cranberry Juice Powder 425 MG CAPS, TAKE 1 CAPSULE BY MOUTH ONCE DAILY, Disp: 30 capsule, Rfl: 0 .  Elastic Bandages & Supports (MEDICAL COMPRESSION STOCKINGS) MISC, 1 Units by Does not apply route daily., Disp: 2 each, Rfl: 0 .  Incontinence Supply Disposable (DEPEND SILHOUETTE BRIEFS L/XL) MISC, 1 each by Does not apply route 2 (two) times daily as needed., Disp: 60 each, Rfl: 5 .  Nutritional Supplements (ENSURE NUTRITION SHAKE) LIQD, Take 1 each by mouth 2 (two) times daily., Disp: 237 mL, Rfl: 30 .  Polyethyl Glycol-Propyl Glycol (SYSTANE ULTRA) 0.4-0.3 % SOLN, Apply 1 drop to eye 2 (  two) times daily. Both eyes, Disp: , Rfl:  .  polyethylene glycol (MIRALAX / GLYCOLAX) 17 g packet, Take 17 g by mouth daily. With 4 oz of juice or water, Disp: , Rfl:  .  sertraline (ZOLOFT) 100 MG tablet, TAKE 1 TABLET BY MOUTH ONCE DAILY, Disp: 30 tablet, Rfl: 0 .  ALPRAZolam (XANAX) 0.25 MG tablet, TAKE ONE TABLET BY MOUTH 2 TIMES A DAY AS NEEDED FOR ANXIETY (WHEN BLOOD PRESSURE IS VERY HIGH OR ANXIOUS) (Patient taking differently: Take 0.25 mg by mouth 2 (two) times daily as needed for anxiety. TAKE ONE TABLET BY MOUTH 2 TIMES A DAY AS NEEDED FOR ANXIETY (WHEN BLOOD PRESSURE IS VERY HIGH OR ANXIOUS)), Disp: 20 tablet, Rfl: 0  No Known Allergies  I personally reviewed active problem list, medication list, allergies, family history, social history, health maintenance with the patient/caregiver today.   ROS  Ten systems reviewed and is negative except as mentioned in  HPI   Objective  Virtual encounter, vitals  Obtained at the group  Vitals:   06/02/19 0905  BP: 129/71  Pulse: 60  .  There is no height or weight on file to calculate BMI.  Physical Exam  Awake, alert and laying in bed, he has large bruised on left forehead and also under his left eye   PHQ2/9: Depression screen [facility] 2/9 02/27/2019 10/03/2018 08/20/2018 05/24/2018 05/17/2018  Decreased Interest 0 0 0 0 0  Down, Depressed, Hopeless 0 0 0 0 0  PHQ - 2 Score 0 0 0 0 0  Altered sleeping 0 0 0 0 0  Tired, decreased energy 0 0 0 0 0  Change in appetite 0 0 0 0 0  Feeling bad or failure about yourself  0 0 0 0 0  Trouble concentrating 0 0 0 0 0  Moving slowly or fidgety/restless 0 0 0 0 0  Suicidal thoughts 0 0 0 0 0  PHQ-9 Score 0 0 0 0 0  Difficult doing work/chores - - Not difficult at all Not difficult at all Not difficult at all  Some recent data might be hidden   PHQ-2/9 Result is negative.    Fall Risk: Fall Risk  06/02/2019 03/19/2019 02/27/2019 10/03/2018 08/20/2018  Falls in the past year? 1 0 0 0 0  Number falls in past yr: 0 0 0 0 0  Injury with Fall? 0 0 0 0 0  Risk Factor Category  - - - - -  Risk for fall due to : - - - - -  Follow up - - - - Falls evaluation completed     Assessment & Plan  1. Urinary frequency  - alfuzosin (UROXATRAL) 10 MG 24 hr tablet; Take 1 tablet (10 mg total) by mouth daily.  Dispense: 30 tablet; Refill: 5  2. Chronic constipation  - lubiprostone (AMITIZA) 24 MCG capsule; Take 1 capsule (24 mcg total) by mouth 2 (two) times daily with a meal.  Dispense: 60 capsule; Refill: 5  3. Other urinary incontinence  - Incontinence Supply Disposable (DEPEND SILHOUETTE BRIEFS L/XL) MISC; 1 each by Does not apply route 2 (two) times daily as needed.  Dispense: 60 each; Refill: 5  4. Bilateral edema of lower extremity  - Elastic Bandages & Supports (MEDICAL COMPRESSION STOCKINGS) MISC; 1 Units by Does not apply route daily.  Dispense: 2 each;  Refill: 0  5. Mild protein-calorie malnutrition (HCC)  - Calcium Carb-Cholecalciferol (CALCIUM-VITAMIN D) 500-400 MG-UNIT TABS; Take 1 tablet by mouth 2 (two)  times daily.  Dispense: 60 tablet; Refill: 5 - Nutritional Supplements (ENSURE NUTRITION SHAKE) LIQD; Take 1 each by mouth 2 (two) times daily.  Dispense: 237 mL; Refill: 30  6. Anxiety  - ALPRAZolam (XANAX) 0.25 MG tablet; TAKE ONE TABLET BY MOUTH 2 TIMES A DAY AS NEEDED FOR ANXIETY (WHEN BLOOD PRESSURE IS VERY HIGH OR ANXIOUS)  Dispense: 20 tablet; Refill: 0 - busPIRone (BUSPAR) 5 MG tablet; Take 1 tablet (5 mg total) by mouth 2 (two) times daily.  Dispense: 60 tablet; Refill: 0  7. Gait instability   8. Cerebral palsy, unspecified type ([place])  stable  9. Atherosclerosis of aorta (HCC)  - atorvastatin (LIPITOR) 40 MG tablet; Take 1 tablet (40 mg total) by mouth every evening.  Dispense: 30 tablet; Refill: 5 - aspirin (ASPIR-LOW) 81 MG EC tablet; TAKE 1 TABLET BY MOUTH ONCE DAILY  Dispense: 30 tablet; Refill: 5  10. Recurrent UTI  - Cranberry Juice Powder 425 MG CAPS; Take 1 capsule (425 mg total) by mouth daily.  Dispense: 30 capsule; Refill: 5  11. Mild major depression (HCC)  - sertraline (ZOLOFT) 100 MG tablet; Take 1 tablet (100 mg total) by mouth daily.  Dispense: 30 tablet; Refill: 5  12. Dry eyes  - Polyethyl Glycol-Propyl Glycol (SYSTANE ULTRA) 0.4-0.3 % SOLN; Apply 1 drop to eye 2 (two) times daily. Both eyes  Dispense: 30 mL; Refill: 5  13. Chronic pain of right knee  - acetaminophen (TYLENOL) 325 MG tablet; Take 2 tablets (650 mg total) by mouth every 6 (six) hours as needed for mild pain or headache.  Dispense: 30 tablet; Refill: 1  I discussed the assessment and treatment plan with the patient. The patient was provided an opportunity to ask questions and all were answered. The patient agreed with the plan and demonstrated an understanding of the instructions.  The patient was advised to call back or seek  an in-person evaluation if the symptoms worsen or if the condition fails to improve as anticipated.  I provided 25 minutes of non-face-to-face time during this encounter.

## 2019-06-17 ENCOUNTER — Other Ambulatory Visit: Payer: Self-pay | Admitting: Family Medicine

## 2019-06-17 DIAGNOSIS — L89220 Pressure ulcer of left hip, unstageable: Secondary | ICD-10-CM

## 2019-06-17 DIAGNOSIS — L89311 Pressure ulcer of right buttock, stage 1: Secondary | ICD-10-CM

## 2019-06-17 NOTE — Telephone Encounter (Signed)
Requested medications are due for refill today?  Yes  - not on protocol.  Review manually.    Requested medications are on active medication list?  Yes  Last Refill:  03/09/2019 50 grams with 1 refill  Future visit scheduled?  Yes - in 2 months  Notes to Clinic:

## 2019-06-19 ENCOUNTER — Other Ambulatory Visit: Payer: Self-pay

## 2019-06-19 ENCOUNTER — Encounter: Payer: Self-pay | Admitting: Urology

## 2019-06-19 ENCOUNTER — Ambulatory Visit (INDEPENDENT_AMBULATORY_CARE_PROVIDER_SITE_OTHER): Payer: Medicare Other | Admitting: Urology

## 2019-06-19 ENCOUNTER — Other Ambulatory Visit: Payer: Self-pay | Admitting: Family Medicine

## 2019-06-19 VITALS — BP 128/74 | HR 87 | Ht 70.0 in | Wt 180.0 lb

## 2019-06-19 DIAGNOSIS — R8271 Bacteriuria: Secondary | ICD-10-CM | POA: Diagnosis not present

## 2019-06-19 DIAGNOSIS — G8929 Other chronic pain: Secondary | ICD-10-CM

## 2019-06-19 DIAGNOSIS — Z87442 Personal history of urinary calculi: Secondary | ICD-10-CM

## 2019-06-20 ENCOUNTER — Other Ambulatory Visit: Payer: Self-pay | Admitting: Family Medicine

## 2019-06-20 ENCOUNTER — Other Ambulatory Visit: Payer: Medicare Other

## 2019-06-20 DIAGNOSIS — N390 Urinary tract infection, site not specified: Secondary | ICD-10-CM | POA: Diagnosis not present

## 2019-06-20 DIAGNOSIS — F419 Anxiety disorder, unspecified: Secondary | ICD-10-CM

## 2019-06-20 LAB — URINALYSIS, COMPLETE
Bilirubin, UA: NEGATIVE
Glucose, UA: NEGATIVE
Ketones, UA: NEGATIVE
Leukocytes,UA: NEGATIVE
Nitrite, UA: NEGATIVE
Protein,UA: NEGATIVE
RBC, UA: NEGATIVE
Specific Gravity, UA: 1.02 (ref 1.005–1.030)
Urobilinogen, Ur: 1 mg/dL (ref 0.2–1.0)
pH, UA: 8.5 — ABNORMAL HIGH (ref 5.0–7.5)

## 2019-06-20 LAB — MICROSCOPIC EXAMINATION: RBC, Urine: NONE SEEN /hpf (ref 0–2)

## 2019-06-21 ENCOUNTER — Encounter: Payer: Self-pay | Admitting: Urology

## 2019-06-21 DIAGNOSIS — Z87442 Personal history of urinary calculi: Secondary | ICD-10-CM | POA: Insufficient documentation

## 2019-06-21 NOTE — Progress Notes (Signed)
06/19/2019 9:21 AM   [name] 12-Jan-1941 [id number]  Referring provider: Steele Sizer, MD [address] [place],  [place] [address]  Chief Complaint  Patient presents with  . Recurrent UTI    Urologic history: 1.  History T1c intermediate risk prostate cancer -Gleason 3+4 diagnosed 2010 -Treated with brachytherapy 11/10/2008 -Last PSA 10/2015 <0.1  2.  Chronic bacteriuria -Cystoscopy Dr. Elnoria Howard 2015 unremarkable, nonocclusive prostate or stricture  3.  Stone disease - CT 2019 nonobstructing right mid ureteral calculus -Ureteroscopic stone removal 08/14/2016  4.  Renal cysts -CTU 2019 bilateral renal cysts with 2 indeterminate right renal lesions (18 mm interpolar left kidney and 15 mm anterior right kidney) -MRI 07/2017 showed the left renal lesion was not present and was a perfusion anomaly; right renal lesion a Bosniak 2 cyst   HPI: 79 y.o. male with the above urologic history.  He has CP and is a resident of [facility].  An appointment was requested to evaluate for recurrent UTIs.  He presents today accompanied by his caregiver.  He was seen in the ED on 05/27/2019 after a fall and his caregiver had reported recent foul-smelling urine and they were concerned about a UTI.  Urinalysis showed >50 WBCs however there were 6-10 squamous epithelial cells.  A urine culture was not ordered and he was treated with Keflex.  He had voided into his diaper when asked to give a specimen today.  His caregiver states his urine has been foul-smelling.  No fever, change in mental status or evidence of any pain.  He has been on alfuzosin for several years.   PMH: Past Medical History:  Diagnosis Date  . Anxiety   . Atrophy, disuse, muscle   . BPH (benign prostatic hyperplasia)   . Cerebral palsy ([place])   . Eczema   . Elevated lipids   . Elevated PSA   . Exotropia   . Hematuria, gross   . Hyperlipidemia   . Incontinence   . Kidney stone   . Lower  extremity edema    lymph drainage problems  . Lower urinary tract symptoms   . Lymphedema   . Mental retardation   . Over weight   . Prostate cancer ([place])   . Urinary frequency   . Urinary urgency   . UTI (urinary tract infection)   . Venous insufficiency   . Vitamin D deficiency   . Wheelchair dependence     Surgical History: Past Surgical History:  Procedure Laterality Date  . CYSTOSCOPY/URETEROSCOPY/HOLMIUM LASER/STENT PLACEMENT Right 08/14/2017   Procedure: CYSTOSCOPY/URETEROSCOPY/HOLMIUM LASER/STENT PLACEMENT;  Surgeon: Abbie Sons, MD;  Location: [facility] ORS;  Service: Urology;  Laterality: Right;  . leg circulation surgery Right   . PROSTATE SURGERY  11/10/2008   BRACHYTHERAPY    Home Medications:  Allergies as of 06/19/2019   No Known Allergies     Medication List       Accurate as of June 19, 2019 11:59 PM. If you have any questions, ask your nurse or doctor.        acetaminophen 325 MG tablet Commonly known as: TYLENOL TAKE 2 TABLETS (650 MG TOTAL) BY MOUTH EVERY 6 HOURS AS NEEDED FOR MILD PAIN OR HEADACHE. What changed: See the new instructions. Changed by: Loistine Chance, MD   alfuzosin 10 MG 24 hr tablet Commonly known as: UROXATRAL Take 1 tablet (10 mg total) by mouth daily.   ALPRAZolam 0.25 MG tablet Commonly known as: XANAX TAKE ONE  TABLET BY MOUTH 2 TIMES A DAY AS NEEDED FOR ANXIETY (WHEN BLOOD PRESSURE IS VERY HIGH OR ANXIOUS)   ascorbic acid 500 MG tablet Commonly known as: VITAMIN C Take 1 tablet (500 mg total) by mouth daily.   aspirin 81 MG EC tablet Commonly known as: Aspir-Low TAKE 1 TABLET BY MOUTH ONCE DAILY   atorvastatin 40 MG tablet Commonly known as: LIPITOR Take 1 tablet (40 mg total) by mouth every evening.   augmented betamethasone dipropionate 0.05 % cream Commonly known as: DIPROLENE-AF APPLY TOPICALLY AS DIRECTED TO RASH AS NEEDED   busPIRone 5 MG tablet Commonly known as: BUSPAR Take 1 tablet (5 mg total)  by mouth 2 (two) times daily.   Calcium-Vitamin D 500-400 MG-UNIT Tabs Take 1 tablet by mouth 2 (two) times daily.   Cranberry Juice Powder 425 MG Caps Take 1 capsule (425 mg total) by mouth daily.   Depend Silhouette Briefs L/XL Misc 1 each by Does not apply route 2 (two) times daily as needed.   Ensure Nutrition Shake Liqd Take 1 each by mouth 2 (two) times daily.   lubiprostone 24 MCG capsule Commonly known as: Amitiza Take 1 capsule (24 mcg total) by mouth 2 (two) times daily with a meal.   Medical Compression Stockings Misc 1 Units by Does not apply route daily.   sertraline 100 MG tablet Commonly known as: ZOLOFT Take 1 tablet (100 mg total) by mouth daily.   Systane Ultra 0.4-0.3 % Soln Generic drug: Polyethyl Glycol-Propyl Glycol Apply 1 drop to eye 2 (two) times daily. Both eyes       Allergies: No Known Allergies  Family History: Family History  Problem Relation Age of Onset  . Heart attack Brother   . Heart disease Other     Social History:  reports that he has never smoked. He has never used smokeless tobacco. He reports that he does not drink alcohol or use drugs.   Physical Exam: BP 128/74   Pulse 87   Ht 5\' 10"  (1.778 m)   Wt 180 lb (81.6 kg)   BMI 25.83 kg/m   Constitutional:  Alert, No acute distress. HEENT: [place] AT, moist mucus membranes.  Trachea midline, no masses. Cardiovascular: No clubbing, cyanosis, or edema. Respiratory: Normal respiratory effort, no increased work of breathing. Neurologic: Grossly intact, no focal deficits, moving all 4 extremities. Psychiatric: Normal mood and affect.   Assessment & Plan:    - Chronic bacteriuria Recent imaging and cystoscopy shows no evidence of any anatomic abnormalities that would predispose to chronic bacteriuria.  Recently his only sign has been malodorous urine.  Would not recommend treatment based solely on the odor of his urine but for clinical signs and symptoms including fever, mental  status change, leukocytosis.  His caregiver was given a specimen container and will bring in a urine tomorrow.  I also recommend scheduling a renal ultrasound to assess for any interval change since his last study in 2019.   Abbie Sons, [place] [address] [place],  [address] [phone]

## 2019-06-23 ENCOUNTER — Telehealth: Payer: Self-pay | Admitting: Urology

## 2019-06-23 NOTE — Telephone Encounter (Signed)
Ms. [name] from [facility] calling on behalf of pt asking for his urine results. Please advise.

## 2019-06-24 DIAGNOSIS — G809 Cerebral palsy, unspecified: Secondary | ICD-10-CM | POA: Diagnosis not present

## 2019-06-24 NOTE — Telephone Encounter (Signed)
Notified patient as instructed, patient pleased. Discussed follow-up appointments, patient agrees  

## 2019-06-24 NOTE — Telephone Encounter (Signed)
Final culture and sensitivities pending

## 2019-06-25 LAB — CULTURE, URINE COMPREHENSIVE

## 2019-06-26 ENCOUNTER — Telehealth: Payer: Self-pay | Admitting: *Deleted

## 2019-06-26 MED ORDER — SULFAMETHOXAZOLE-TRIMETHOPRIM 800-160 MG PO TABS: 1.0000 | ORAL_TABLET | Freq: Two times a day (BID) | ORAL | 0 refills | Status: AC

## 2019-06-26 NOTE — Telephone Encounter (Signed)
-----   Message from Abbie Sons, MD sent at 06/26/2019  8:24 AM EST ----- Urine culture was positive.  Please send in Rx Septra DS 1 twice daily x7 days.  I am not sure what pharmacy [name] uses.

## 2019-06-26 NOTE — Telephone Encounter (Signed)
Notified patient as instructed, patient pleased. Discussed follow-up appointments, patient agrees  

## 2019-06-30 ENCOUNTER — Telehealth: Payer: Self-pay | Admitting: Family Medicine

## 2019-06-30 DIAGNOSIS — H9190 Unspecified hearing loss, unspecified ear: Secondary | ICD-10-CM

## 2019-06-30 NOTE — Telephone Encounter (Signed)
[name] is calling from [facility] to get a referral for the patient to an ENT doctor by [facility]. Please advise [phone] or [phone]

## 2019-07-01 NOTE — Telephone Encounter (Signed)
Copied from [place] [id number]. Topic: General - Inquiry >> Jul 01, 2019 10:10 AM Richardo Priest, NT wrote: Reason for CRM: Manager at the day center patient resides in, called in stating she would like to know information on when patient last had a colonoscopy. Please advise and call back at [phone], for [facility].

## 2019-07-01 NOTE — Telephone Encounter (Signed)
Back in 2013 the caregiver said that they would discuss it with family members. No other information found.

## 2019-07-01 NOTE — Telephone Encounter (Signed)
[name] wants the ENT referral for a hearing evaluation due to being place in a higher level of care services with [name].  He was just placed  In this higher care in January 2021.   They need something in writing about his colonoscopy and about it being in invasive and that he had a hemoccult card in 2013 and there results of it.

## 2019-07-01 NOTE — Telephone Encounter (Signed)
Also looks like in 2013 they were doing the 3 hemoccult cards instead of colonoscopy

## 2019-07-03 ENCOUNTER — Other Ambulatory Visit: Payer: Self-pay | Admitting: Family Medicine

## 2019-07-03 DIAGNOSIS — G8929 Other chronic pain: Secondary | ICD-10-CM

## 2019-07-06 ENCOUNTER — Other Ambulatory Visit: Payer: Self-pay | Admitting: *Deleted

## 2019-07-06 DIAGNOSIS — D72819 Decreased white blood cell count, unspecified: Secondary | ICD-10-CM

## 2019-07-07 ENCOUNTER — Inpatient Hospital Stay: Payer: Medicare Other | Attending: Oncology

## 2019-07-07 ENCOUNTER — Encounter: Payer: Self-pay | Admitting: Oncology

## 2019-07-07 ENCOUNTER — Inpatient Hospital Stay (HOSPITAL_BASED_OUTPATIENT_CLINIC_OR_DEPARTMENT_OTHER): Payer: Medicare Other | Admitting: Oncology

## 2019-07-07 VITALS — BP 98/42 | HR 59 | Temp 94.3°F | Resp 16

## 2019-07-07 DIAGNOSIS — F419 Anxiety disorder, unspecified: Secondary | ICD-10-CM | POA: Diagnosis not present

## 2019-07-07 DIAGNOSIS — G809 Cerebral palsy, unspecified: Secondary | ICD-10-CM | POA: Insufficient documentation

## 2019-07-07 DIAGNOSIS — E785 Hyperlipidemia, unspecified: Secondary | ICD-10-CM | POA: Diagnosis not present

## 2019-07-07 DIAGNOSIS — Z7982 Long term (current) use of aspirin: Secondary | ICD-10-CM | POA: Diagnosis not present

## 2019-07-07 DIAGNOSIS — Z79899 Other long term (current) drug therapy: Secondary | ICD-10-CM | POA: Insufficient documentation

## 2019-07-07 DIAGNOSIS — N400 Enlarged prostate without lower urinary tract symptoms: Secondary | ICD-10-CM | POA: Diagnosis not present

## 2019-07-07 DIAGNOSIS — I872 Venous insufficiency (chronic) (peripheral): Secondary | ICD-10-CM | POA: Diagnosis not present

## 2019-07-07 DIAGNOSIS — F79 Unspecified intellectual disabilities: Secondary | ICD-10-CM | POA: Insufficient documentation

## 2019-07-07 DIAGNOSIS — I10 Essential (primary) hypertension: Secondary | ICD-10-CM | POA: Diagnosis not present

## 2019-07-07 DIAGNOSIS — D72819 Decreased white blood cell count, unspecified: Secondary | ICD-10-CM | POA: Insufficient documentation

## 2019-07-07 DIAGNOSIS — D709 Neutropenia, unspecified: Secondary | ICD-10-CM | POA: Diagnosis not present

## 2019-07-07 DIAGNOSIS — Z8546 Personal history of malignant neoplasm of prostate: Secondary | ICD-10-CM | POA: Diagnosis not present

## 2019-07-07 DIAGNOSIS — Z993 Dependence on wheelchair: Secondary | ICD-10-CM | POA: Diagnosis not present

## 2019-07-07 LAB — CBC WITH DIFFERENTIAL/PLATELET
Abs Immature Granulocytes: 0.03 10*3/uL (ref 0.00–0.07)
Basophils Absolute: 0 10*3/uL (ref 0.0–0.1)
Basophils Relative: 0 %
Eosinophils Absolute: 0.1 10*3/uL (ref 0.0–0.5)
Eosinophils Relative: 2 %
HCT: 40.1 % (ref 39.0–52.0)
Hemoglobin: 13.6 g/dL (ref 13.0–17.0)
Immature Granulocytes: 1 %
Lymphocytes Relative: 26 %
Lymphs Abs: 1.5 10*3/uL (ref 0.7–4.0)
MCH: 32.7 pg (ref 26.0–34.0)
MCHC: 33.9 g/dL (ref 30.0–36.0)
MCV: 96.4 fL (ref 80.0–100.0)
Monocytes Absolute: 0.3 10*3/uL (ref 0.1–1.0)
Monocytes Relative: 6 %
Neutro Abs: 3.8 10*3/uL (ref 1.7–7.7)
Neutrophils Relative %: 65 %
Platelets: 180 10*3/uL (ref 150–400)
RBC: 4.16 MIL/uL — ABNORMAL LOW (ref 4.22–5.81)
RDW: 13.2 % (ref 11.5–15.5)
WBC: 5.8 10*3/uL (ref 4.0–10.5)
nRBC: 0 % (ref 0.0–0.2)

## 2019-07-07 NOTE — Progress Notes (Signed)
Pt has uti frequently

## 2019-07-08 NOTE — Progress Notes (Signed)
Hematology/Oncology Consult note [facility]  Telephone:([phone] [phone]  Patient Care Team: Steele Sizer, MD as PCP - General (Family Medicine) Steele Sizer, MD (Family Medicine) Alvy Bimler, MD as Consulting Physician (Psychiatry) Abbie Sons, MD (Urology) Lorelee Cover., MD (Ophthalmology) Delana Meyer, Dolores Lory, MD (Vascular Surgery)   Name of the patient: [name]  [id number]  1941/01/27   Date of visit: 07/08/19  Diagnosis-intermittent neutropenia now resolved  Chief complaint/ Reason for visit-routine follow-up of neutropenia  Heme/Onc history: Patient is a 79 year old male was been referred to [place] for leukopenia. Most recent CBC on 06/05/2018 showed a white count of 3.5, H&H of 12.5/35.9 and a platelet count of 131. Differential on the CBC was essentially normal. His past medical history significant for hypertension, cerebral palsy, history of depression. He lives in a nursing home and is unable to provide any history. He also sees urology for recurrent UTIs.  Results of blood work from 06/21/2018 were as follows: CBC showed white count of 5.5 with a normal differential and an ANC of 4.0. H&H 12.8/37.1 and a platelet count of 158. Hepatitis C testing was negative. Hepatitis B negative also suggesting patient has not been vaccinated for it so far. Iron studies were within normal limits. Haptoglobin was normal. TSH was normal. Multiple myeloma panel showed no M protein. Smear review showed adequate platelets and normal WBC and RBC morphology.   Interval history-patient has been getting recurrent urinary tract infections at his group home.  He has not had any recent hospitalizations.  ECOG PS- 3 Pain scale- 0   Review of systems- Review of Systems  Unable to perform ROS: Psychiatric disorder       No Known Allergies   Past Medical History:  Diagnosis Date  . Anxiety   . Atrophy, disuse,  muscle   . BPH (benign prostatic hyperplasia)   . Cerebral palsy ([place])   . Eczema   . Elevated lipids   . Elevated PSA   . Exotropia   . Hematuria, gross   . Hyperlipidemia   . Incontinence   . Kidney stone   . Leukopenia   . Lower extremity edema    lymph drainage problems  . Lower urinary tract symptoms   . Lymphedema   . Mental retardation   . Over weight   . Prostate cancer ([place])   . Urinary frequency   . Urinary urgency   . UTI (urinary tract infection)   . Venous insufficiency   . Vitamin D deficiency   . Wheelchair dependence      Past Surgical History:  Procedure Laterality Date  . CYSTOSCOPY/URETEROSCOPY/HOLMIUM LASER/STENT PLACEMENT Right 08/14/2017   Procedure: CYSTOSCOPY/URETEROSCOPY/HOLMIUM LASER/STENT PLACEMENT;  Surgeon: Abbie Sons, MD;  Location: [facility] ORS;  Service: Urology;  Laterality: Right;  . leg circulation surgery Right   . PROSTATE SURGERY  11/10/2008   BRACHYTHERAPY    Social History   Socioeconomic History  . Marital status: Single    Spouse name: Not on file  . Number of children: Not on file  . Years of education: Not on file  . Highest education level: Not on file  Occupational History  . Not on file  Tobacco Use  . Smoking status: Never Smoker  . Smokeless tobacco: Never Used  Substance and Sexual Activity  . Alcohol use: No    Alcohol/week: 0.0 standard drinks  . Drug use: No  . Sexual activity: Not Currently  Other Topics Concern  .  Not on file  Social History Narrative   Lives at [facility] group home, 5 males.        Social Determinants of Health   Financial Resource Strain:   . Difficulty of Paying Living Expenses:   Food Insecurity:   . Worried About Charity fundraiser in the Last Year:   . Arboriculturist in the Last Year:   Transportation Needs:   . Film/video editor (Medical):   Marland Kitchen Lack of Transportation (Non-Medical):   Physical Activity:   . Days of Exercise per Week:   . Minutes of Exercise  per Session:   Stress:   . Feeling of Stress :   Social Connections:   . Frequency of Communication with Friends and Family:   . Frequency of Social Gatherings with Friends and Family:   . Attends Religious Services:   . Active Member of Clubs or Organizations:   . Attends Archivist Meetings:   Marland Kitchen Marital Status:   Intimate Partner Violence:   . Fear of Current or Ex-Partner:   . Emotionally Abused:   Marland Kitchen Physically Abused:   . Sexually Abused:     Family History  Problem Relation Age of Onset  . Heart attack Brother   . Heart disease Other      Current Outpatient Medications:  .  acetaminophen (TYLENOL) 325 MG tablet, TAKE 2 TABLETS (650 MG TOTAL) BY MOUTH EVERY 6 HOURS AS NEEDED FOR MILD PAIN OR HEADACHE., Disp: 60 tablet, Rfl: 0 .  alfuzosin (UROXATRAL) 10 MG 24 hr tablet, Take 1 tablet (10 mg total) by mouth daily., Disp: 30 tablet, Rfl: 5 .  ALPRAZolam (XANAX) 0.25 MG tablet, TAKE ONE TABLET BY MOUTH 2 TIMES A DAY AS NEEDED FOR ANXIETY (WHEN BLOOD PRESSURE IS VERY HIGH OR ANXIOUS), Disp: 20 tablet, Rfl: 0 .  ascorbic acid (VITAMIN C) 500 MG tablet, Take 1 tablet (500 mg total) by mouth daily., Disp: 30 tablet, Rfl: 5 .  aspirin (ASPIR-LOW) 81 MG EC tablet, TAKE 1 TABLET BY MOUTH ONCE DAILY, Disp: 30 tablet, Rfl: 5 .  atorvastatin (LIPITOR) 40 MG tablet, Take 1 tablet (40 mg total) by mouth every evening., Disp: 30 tablet, Rfl: 5 .  augmented betamethasone dipropionate (DIPROLENE-AF) 0.05 % cream, APPLY TOPICALLY AS DIRECTED TO RASH AS NEEDED, Disp: 45 g, Rfl: 0 .  busPIRone (BUSPAR) 5 MG tablet, TAKE 1 TABLET BY MOUTH TWICE DAILY, Disp: 180 tablet, Rfl: 0 .  Calcium Carb-Cholecalciferol (CALCIUM-VITAMIN D) 500-400 MG-UNIT TABS, Take 1 tablet by mouth 2 (two) times daily., Disp: 60 tablet, Rfl: 5 .  Cranberry Juice Powder 425 MG CAPS, Take 1 capsule (425 mg total) by mouth daily., Disp: 30 capsule, Rfl: 5 .  Elastic Bandages & Supports (MEDICAL COMPRESSION STOCKINGS)  MISC, 1 Units by Does not apply route daily., Disp: 2 each, Rfl: 0 .  Incontinence Supply Disposable (DEPEND SILHOUETTE BRIEFS L/XL) MISC, 1 each by Does not apply route 2 (two) times daily as needed., Disp: 60 each, Rfl: 5 .  lubiprostone (AMITIZA) 24 MCG capsule, Take 1 capsule (24 mcg total) by mouth 2 (two) times daily with a meal., Disp: 60 capsule, Rfl: 5 .  Nutritional Supplements (ENSURE NUTRITION SHAKE) LIQD, Take 1 each by mouth 2 (two) times daily., Disp: 237 mL, Rfl: 30 .  Polyethyl Glycol-Propyl Glycol (SYSTANE ULTRA) 0.4-0.3 % SOLN, Apply 1 drop to eye 2 (two) times daily. Both eyes, Disp: 30 mL, Rfl: 5 .  sertraline (ZOLOFT) 100  MG tablet, Take 1 tablet (100 mg total) by mouth daily., Disp: 30 tablet, Rfl: 5  Physical exam:  Vitals:   07/07/19 1146  BP: (!) 98/42  Pulse: (!) 59  Resp: 16  Temp: (!) 94.3 F (34.6 C)  TempSrc: Tympanic   Physical Exam Constitutional:      Comments: Sitting in a wheelchair  Cardiovascular:     Rate and Rhythm: Normal rate and regular rhythm.     Heart sounds: Normal heart sounds.  Pulmonary:     Effort: Pulmonary effort is normal.     Breath sounds: Normal breath sounds.  Abdominal:     General: Bowel sounds are normal.     Palpations: Abdomen is soft.  Musculoskeletal:     Cervical back: Normal range of motion.  Skin:    General: Skin is warm and dry.  Neurological:     Mental Status: He is alert.      CMP Latest Ref Rng & Units 03/14/2019  Glucose 70 - 99 mg/dL 94  BUN 8 - 23 mg/dL 30(H)  Creatinine 0.61 - 1.24 mg/dL 0.51(L)  Sodium 135 - 145 mmol/L 146(H)  Potassium 3.5 - 5.1 mmol/L 4.1  Chloride 98 - 111 mmol/L 111  CO2 22 - 32 mmol/L 28  Calcium 8.9 - 10.3 mg/dL 8.6(L)  Total Protein 6.5 - 8.1 g/dL 5.8(L)  Total Bilirubin 0.3 - 1.2 mg/dL 0.6  Alkaline Phos 38 - 126 U/L 78  AST 15 - 41 U/L 30  ALT 0 - 44 U/L 25   CBC Latest Ref Rng & Units 07/07/2019  WBC 4.0 - 10.5 K/uL 5.8  Hemoglobin 13.0 - 17.0 g/dL 13.6    Hematocrit 39.0 - 52.0 % 40.1  Platelets 150 - 400 K/uL 180    Assessment and plan- Patient is a 79 y.o. male here for routine f/u of neutropenia  Patients wbc has been normal since nov 2020. Hb improved to 13. Platelet counts are normal. He does not require any hematology follow up at this time. Patient can continue to follow up with his pcp at this time and referred back if cytopenias recur.     Visit Diagnosis 1. Neutropenia, unspecified type [facility])      Dr. Randa Evens, MD, MPH [facility] [phone] 07/08/2019 3:08 PM

## 2019-07-10 ENCOUNTER — Ambulatory Visit: Payer: Medicare Other

## 2019-07-14 ENCOUNTER — Other Ambulatory Visit: Payer: Self-pay

## 2019-07-14 ENCOUNTER — Ambulatory Visit
Admission: RE | Admit: 2019-07-14 | Discharge: 2019-07-14 | Disposition: A | Discharge status: Home / Self Care | Payer: Medicare Other | Source: Ambulatory Visit | Attending: Urology | Admitting: Urology

## 2019-07-14 DIAGNOSIS — Z87442 Personal history of urinary calculi: Secondary | ICD-10-CM | POA: Insufficient documentation

## 2019-07-14 DIAGNOSIS — R8271 Bacteriuria: Secondary | ICD-10-CM | POA: Diagnosis not present

## 2019-07-14 DIAGNOSIS — N281 Cyst of kidney, acquired: Secondary | ICD-10-CM | POA: Diagnosis not present

## 2019-07-14 DIAGNOSIS — N200 Calculus of kidney: Secondary | ICD-10-CM | POA: Diagnosis not present

## 2019-07-16 ENCOUNTER — Telehealth: Payer: Self-pay | Admitting: Urology

## 2019-07-16 NOTE — Telephone Encounter (Signed)
Ms. [name] from [name] asking for results of RUS. Please advise. Thanks

## 2019-07-17 NOTE — Telephone Encounter (Signed)
Ultrasound showed no significant abnormalities.  There are small bilateral renal cyst and a small nonobstructing renal calculus.

## 2019-07-18 ENCOUNTER — Telehealth: Payer: Self-pay | Admitting: Family Medicine

## 2019-07-18 NOTE — Telephone Encounter (Signed)
Notified patient as instructed, patient pleased. Discussed follow-up appointments, patient agrees  

## 2019-07-18 NOTE — Telephone Encounter (Signed)
[name] is calling from [employer] is calling about orders. [name] only receive the cover sheet. And would like the orders to be refaxed. Fax- [phone]

## 2019-07-22 ENCOUNTER — Ambulatory Visit: Payer: Medicare Other

## 2019-07-23 DIAGNOSIS — Z0110 Encounter for examination of ears and hearing without abnormal findings: Secondary | ICD-10-CM | POA: Diagnosis not present

## 2019-07-23 DIAGNOSIS — D101 Benign neoplasm of tongue: Secondary | ICD-10-CM | POA: Diagnosis not present

## 2019-07-23 DIAGNOSIS — H93291 Other abnormal auditory perceptions, right ear: Secondary | ICD-10-CM | POA: Diagnosis not present

## 2019-07-23 DIAGNOSIS — H6121 Impacted cerumen, right ear: Secondary | ICD-10-CM | POA: Diagnosis not present

## 2019-07-24 ENCOUNTER — Other Ambulatory Visit: Payer: Self-pay | Admitting: Family Medicine

## 2019-07-25 DIAGNOSIS — G809 Cerebral palsy, unspecified: Secondary | ICD-10-CM | POA: Diagnosis not present

## 2019-07-29 DIAGNOSIS — H2513 Age-related nuclear cataract, bilateral: Secondary | ICD-10-CM | POA: Diagnosis not present

## 2019-07-29 DIAGNOSIS — H524 Presbyopia: Secondary | ICD-10-CM | POA: Diagnosis not present

## 2019-07-29 DIAGNOSIS — H5005 Alternating esotropia: Secondary | ICD-10-CM | POA: Diagnosis not present

## 2019-07-31 ENCOUNTER — Other Ambulatory Visit: Payer: Self-pay

## 2019-07-31 ENCOUNTER — Ambulatory Visit (INDEPENDENT_AMBULATORY_CARE_PROVIDER_SITE_OTHER): Payer: Medicare Other

## 2019-07-31 ENCOUNTER — Other Ambulatory Visit: Payer: Self-pay | Admitting: Family Medicine

## 2019-07-31 VITALS — BP 96/48 | HR 57 | Temp 96.9°F | Resp 15 | Ht 70.0 in

## 2019-07-31 DIAGNOSIS — Z0000 Encounter for general adult medical examination without abnormal findings: Secondary | ICD-10-CM | POA: Diagnosis not present

## 2019-07-31 DIAGNOSIS — Z23 Encounter for immunization: Secondary | ICD-10-CM

## 2019-07-31 NOTE — Patient Instructions (Signed)
[name] , Thank you for taking time to come for your Medicare Wellness Visit. I appreciate your ongoing commitment to your health goals. Please review the following plan we discussed and let me know if I can assist you in the future.   Screening recommendations/referrals: Colonoscopy: no longer required Recommended yearly ophthalmology/optometry visit for glaucoma screening and checkup Recommended yearly dental visit for hygiene and checkup  Vaccinations: Influenza vaccine: done 01/27/19 Pneumococcal vaccine: PCV13 done 06/22/14 PPSV23 done 09/15/09 Tdap vaccine: done 09/15/09 Shingles vaccine: Shingrix discussed. Please contact your pharmacy for coverage information.  Covid-19: done 06/24/19 & 07/26/19  Conditions/risks identified: Recommend continue moving to keep range of motion  Next appointment: Please follow up in one year for your Medicare Annual Wellness visit.    Preventive Care 17 Years and Older, Male Preventive care refers to lifestyle choices and visits with your health care provider that can promote health and wellness. What does preventive care include?  A yearly physical exam. This is also called an annual well check.  Dental exams once or twice a year.  Routine eye exams. Ask your health care provider how often you should have your eyes checked.  Personal lifestyle choices, including:  Daily care of your teeth and gums.  Regular physical activity.  Eating a healthy diet.  Avoiding tobacco and drug use.  Limiting alcohol use.  Practicing safe sex.  Taking low doses of aspirin every day.  Taking vitamin and mineral supplements as recommended by your health care provider. What happens during an annual well check? The services and screenings done by your health care provider during your annual well check will depend on your age, overall health, lifestyle risk factors, and family history of disease. Counseling  Your health care provider may ask you questions  about your:  Alcohol use.  Tobacco use.  Drug use.  Emotional well-being.  Home and relationship well-being.  Sexual activity.  Eating habits.  History of falls.  Memory and ability to understand (cognition).  Work and work Statistician. Screening  You may have the following tests or measurements:  Height, weight, and BMI.  Blood pressure.  Lipid and cholesterol levels. These may be checked every 5 years, or more frequently if you are over 56 years old.  Skin check.  Lung cancer screening. You may have this screening every year starting at age 6 if you have a 30-pack-year history of smoking and currently smoke or have quit within the past 15 years.  Fecal occult blood test (FOBT) of the stool. You may have this test every year starting at age 74.  Flexible sigmoidoscopy or colonoscopy. You may have a sigmoidoscopy every 5 years or a colonoscopy every 10 years starting at age 35.  Prostate cancer screening. Recommendations will vary depending on your family history and other risks.  Hepatitis C blood test.  Hepatitis B blood test.  Sexually transmitted disease (STD) testing.  Diabetes screening. This is done by checking your blood sugar (glucose) after you have not eaten for a while (fasting). You may have this done every 1-3 years.  Abdominal aortic aneurysm (AAA) screening. You may need this if you are a current or former smoker.  Osteoporosis. You may be screened starting at age 86 if you are at high risk. Talk with your health care provider about your test results, treatment options, and if necessary, the need for more tests. Vaccines  Your health care provider may recommend certain vaccines, such as:  Influenza vaccine. This is recommended every  year.  Tetanus, diphtheria, and acellular pertussis (Tdap, Td) vaccine. You may need a Td booster every 10 years.  Zoster vaccine. You may need this after age 59.  Pneumococcal 13-valent conjugate (PCV13)  vaccine. One dose is recommended after age 74.  Pneumococcal polysaccharide (PPSV23) vaccine. One dose is recommended after age 56. Talk to your health care provider about which screenings and vaccines you need and how often you need them. This information is not intended to replace advice given to you by your health care provider. Make sure you discuss any questions you have with your health care provider. Document Released: 05/07/2015 Document Revised: 12/29/2015 Document Reviewed: 02/09/2015 Elsevier Interactive Patient Education  2017 [employer] Prevention in the Home Falls can cause injuries. They can happen to people of all ages. There are many things you can do to make your home safe and to help prevent falls. What can I do on the outside of my home?  Regularly fix the edges of walkways and driveways and fix any cracks.  Remove anything that might make you trip as you walk through a door, such as a raised step or threshold.  Trim any bushes or trees on the path to your home.  Use bright outdoor lighting.  Clear any walking paths of anything that might make someone trip, such as rocks or tools.  Regularly check to see if handrails are loose or broken. Make sure that both sides of any steps have handrails.  Any raised decks and porches should have guardrails on the edges.  Have any leaves, snow, or ice cleared regularly.  Use sand or salt on walking paths during winter.  Clean up any spills in your garage right away. This includes oil or grease spills. What can I do in the bathroom?  Use night lights.  Install grab bars by the toilet and in the tub and shower. Do not use towel bars as grab bars.  Use non-skid mats or decals in the tub or shower.  If you need to sit down in the shower, use a plastic, non-slip stool.  Keep the floor dry. Clean up any water that spills on the floor as soon as it happens.  Remove soap buildup in the tub or shower  regularly.  Attach bath mats securely with double-sided non-slip rug tape.  Do not have throw rugs and other things on the floor that can make you trip. What can I do in the bedroom?  Use night lights.  Make sure that you have a light by your bed that is easy to reach.  Do not use any sheets or blankets that are too big for your bed. They should not hang down onto the floor.  Have a firm chair that has side arms. You can use this for support while you get dressed.  Do not have throw rugs and other things on the floor that can make you trip. What can I do in the kitchen?  Clean up any spills right away.  Avoid walking on wet floors.  Keep items that you use a lot in easy-to-reach places.  If you need to reach something above you, use a strong step stool that has a grab bar.  Keep electrical cords out of the way.  Do not use floor polish or wax that makes floors slippery. If you must use wax, use non-skid floor wax.  Do not have throw rugs and other things on the floor that can make you trip. What can  I do with my stairs?  Do not leave any items on the stairs.  Make sure that there are handrails on both sides of the stairs and use them. Fix handrails that are broken or loose. Make sure that handrails are as long as the stairways.  Check any carpeting to make sure that it is firmly attached to the stairs. Fix any carpet that is loose or worn.  Avoid having throw rugs at the top or bottom of the stairs. If you do have throw rugs, attach them to the floor with carpet tape.  Make sure that you have a light switch at the top of the stairs and the bottom of the stairs. If you do not have them, ask someone to add them for you. What else can I do to help prevent falls?  Wear shoes that:  Do not have high heels.  Have rubber bottoms.  Are comfortable and fit you well.  Are closed at the toe. Do not wear sandals.  If you use a stepladder:  Make sure that it is fully  opened. Do not climb a closed stepladder.  Make sure that both sides of the stepladder are locked into place.  Ask someone to hold it for you, if possible.  Clearly mark and make sure that you can see:  Any grab bars or handrails.  First and last steps.  Where the edge of each step is.  Use tools that help you move around (mobility aids) if they are needed. These include:  Canes.  Walkers.  Scooters.  Crutches.  Turn on the lights when you go into a dark area. Replace any light bulbs as soon as they burn out.  Set up your furniture so you have a clear path. Avoid moving your furniture around.  If any of your floors are uneven, fix them.  If there are any pets around you, be aware of where they are.  Review your medicines with your doctor. Some medicines can make you feel dizzy. This can increase your chance of falling. Ask your doctor what other things that you can do to help prevent falls. This information is not intended to replace advice given to you by your health care provider. Make sure you discuss any questions you have with your health care provider. Document Released: 02/04/2009 Document Revised: 09/16/2015 Document Reviewed: 05/15/2014 Elsevier Interactive Patient Education  2017 [employer].

## 2019-07-31 NOTE — Progress Notes (Addendum)
Subjective:   [name] is a 79 y.o. male who presents for Medicare Annual/Subsequent preventive examination.  Review of Systems:   Cardiac Risk Factors include: advanced age (>52men, >19 women);male gender;dyslipidemia;sedentary lifestyle     Objective:    Vitals: BP (!) 96/48 (BP Location: Left Arm, Patient Position: Sitting, Cuff Size: Normal)   Pulse (!) 57   Temp (!) 96.9 F (36.1 C) (Temporal)   Resp 15   Ht 5\' 10"  (1.778 m)   SpO2 96%   BMI 25.83 kg/m   Body mass index is 25.83 kg/m.  Advanced Directives 07/07/2019 05/27/2019 03/10/2019 03/09/2019 01/03/2019 07/05/2018 06/21/2018  Does Patient Have a Medical Advance Directive? Yes No No No No No No  Type of Advance Directive [place]  Does patient want to make changes to medical advance directive? No - Patient declined - - - - - -  Copy of [place] in Chart? No - copy requested - - - - - -  Would patient like information on creating a medical advance directive? - No - Patient declined No - Patient declined No - Patient declined No - Patient declined No - Patient declined No - Patient declined    Tobacco Social History   Tobacco Use  Smoking Status Never Smoker  Smokeless Tobacco Never Used     Counseling given: Not Answered   Clinical Intake:  Pre-visit preparation completed: Yes  Pain : No/denies pain     Nutritional Risks: None Diabetes: No  How often do you need to have someone help you when you read instructions, pamphlets, or other written materials from your doctor or pharmacy?: 4 - Often  Interpreter Needed?: No  Information entered by :: Clemetine Marker LPN  Past Medical History:  Diagnosis Date  . Anxiety   . Atrophy, disuse, muscle   . BPH (benign prostatic hyperplasia)   . Cerebral palsy ([place])   . Eczema   . Elevated lipids   . Elevated PSA   . Exotropia   . Hematuria, gross   . Hyperlipidemia   . Incontinence   . Kidney stone    . Leukopenia   . Lower extremity edema    lymph drainage problems  . Lower urinary tract symptoms   . Lymphedema   . Mental retardation   . Over weight   . Prostate cancer ([place])   . Urinary frequency   . Urinary urgency   . UTI (urinary tract infection)   . Venous insufficiency   . Vitamin D deficiency   . Wheelchair dependence    Past Surgical History:  Procedure Laterality Date  . CYSTOSCOPY/URETEROSCOPY/HOLMIUM LASER/STENT PLACEMENT Right 08/14/2017   Procedure: CYSTOSCOPY/URETEROSCOPY/HOLMIUM LASER/STENT PLACEMENT;  Surgeon: Abbie Sons, MD;  Location: [facility] ORS;  Service: Urology;  Laterality: Right;  . leg circulation surgery Right   . PROSTATE SURGERY  11/10/2008   BRACHYTHERAPY   Family History  Problem Relation Age of Onset  . Heart attack Brother   . Heart disease Other    Social History   Socioeconomic History  . Marital status: Single    Spouse name: Not on file  . Number of children: Not on file  . Years of education: Not on file  . Highest education level: Not on file  Occupational History  . Not on file  Tobacco Use  . Smoking status: Never Smoker  . Smokeless tobacco: Never Used  Substance and Sexual Activity  .  Alcohol use: No    Alcohol/week: 0.0 standard drinks  . Drug use: No  . Sexual activity: Not Currently  Other Topics Concern  . Not on file  Social History Narrative   Lives at [facility] group home, 5 males. Moved to higher level of care 04/28/19 with [name].     Social Determinants of Health   Financial Resource Strain: Low Risk   . Difficulty of Paying Living Expenses: Not hard at all  Food Insecurity: No Food Insecurity  . Worried About Charity fundraiser in the Last Year: Never true  . Ran Out of Food in the Last Year: Never true  Transportation Needs: No Transportation Needs  . Lack of Transportation (Medical): No  . Lack of Transportation (Non-Medical): No  Physical Activity: Inactive  . Days of Exercise per Week:  0 days  . Minutes of Exercise per Session: 0 min  Stress: No Stress Concern Present  . Feeling of Stress : Not at all  Social Connections: Severely Isolated  . Frequency of Communication with Friends and Family: Never  . Frequency of Social Gatherings with Friends and Family: Never  . Attends Religious Services: Never  . Active Member of Clubs or Organizations: No  . Attends Archivist Meetings: Never  . Marital Status: Never married    Outpatient Encounter Medications as of 07/31/2019  Medication Sig  . acetaminophen (TYLENOL) 325 MG tablet TAKE 2 TABLETS (650 MG TOTAL) BY MOUTH EVERY 6 HOURS AS NEEDED FOR MILD PAIN OR HEADACHE.  Marland Kitchen alfuzosin (UROXATRAL) 10 MG 24 hr tablet Take 1 tablet (10 mg total) by mouth daily.  Marland Kitchen ALPRAZolam (XANAX) 0.25 MG tablet TAKE ONE TABLET BY MOUTH 2 TIMES A DAY AS NEEDED FOR ANXIETY (WHEN BLOOD PRESSURE IS VERY HIGH OR ANXIOUS)  . ARTIFICIAL TEARS 0.2-0.2-1 % SOLN PLACE 2 DROPS INTO EYE 2 TIMES A DAY AS NEEDED  . ascorbic acid (VITAMIN C) 500 MG tablet Take 1 tablet (500 mg total) by mouth daily.  Marland Kitchen aspirin (ASPIR-LOW) 81 MG EC tablet TAKE 1 TABLET BY MOUTH ONCE DAILY  . atorvastatin (LIPITOR) 40 MG tablet Take 1 tablet (40 mg total) by mouth every evening.  Marland Kitchen augmented betamethasone dipropionate (DIPROLENE-AF) 0.05 % cream APPLY TOPICALLY AS DIRECTED TO RASH AS NEEDED  . busPIRone (BUSPAR) 5 MG tablet TAKE 1 TABLET BY MOUTH TWICE DAILY  . Calcium Carb-Cholecalciferol (CALCIUM-VITAMIN D) 500-400 MG-UNIT TABS Take 1 tablet by mouth 2 (two) times daily.  . Cranberry Juice Powder 425 MG CAPS Take 1 capsule (425 mg total) by mouth daily.  Regino Schultze Bandages & Supports (MEDICAL COMPRESSION STOCKINGS) MISC 1 Units by Does not apply route daily.  . Incontinence Supply Disposable (DEPEND SILHOUETTE BRIEFS L/XL) MISC 1 each by Does not apply route 2 (two) times daily as needed.  . lubiprostone (AMITIZA) 24 MCG capsule Take 1 capsule (24 mcg total) by mouth 2  (two) times daily with a meal.  . Nutritional Supplements (ENSURE NUTRITION SHAKE) LIQD Take 1 each by mouth 2 (two) times daily.  Vladimir Faster Glycol-Propyl Glycol (SYSTANE ULTRA) 0.4-0.3 % SOLN Apply 1 drop to eye 2 (two) times daily. Both eyes  . sertraline (ZOLOFT) 100 MG tablet Take 1 tablet (100 mg total) by mouth daily.   No facility-administered encounter medications on file as of 07/31/2019.    Activities of Daily Living In your present state of health, do you have any difficulty performing the following activities: 07/31/2019 06/02/2019  Hearing? [name]  Vision? N N  Difficulty concentrating or making decisions? [name]  Walking or climbing stairs? Y Y  Dressing or bathing? Y Y  Doing errands, shopping? [name]  Preparing Food and eating ? Y -  Using the Toilet? Y -  In the past six months, have you accidently leaked urine? Y -  Do you have problems with loss of bowel control? Y -  Managing your Medications? Y -  Managing your Finances? Y -  Housekeeping or managing your Housekeeping? Y -  Some recent data might be hidden    Patient Care Team: Steele Sizer, MD as PCP - General (Family Medicine) Steele Sizer, MD (Family Medicine) Alvy Bimler, MD as Consulting Physician (Psychiatry) Abbie Sons, MD (Urology) Lorelee Cover., MD (Ophthalmology) Delana Meyer, Dolores Lory, MD (Vascular Surgery)   Assessment:   This is a routine wellness examination for [name].  Exercise Activities and Dietary recommendations Current Exercise Habits: The patient does not participate in regular exercise at present, Exercise limited by: orthopedic condition(s);neurologic condition(s)  Goals   None     Fall Risk Fall Risk  07/31/2019 06/02/2019 03/19/2019 02/27/2019 10/03/2018  Falls in the past year? 1 1 0 0 0  Number falls in past yr: 1 0 0 0 0  Injury with Fall? 1 0 0 0 0  Risk Factor Category  - - - - -  Risk for fall due to : Impaired mobility;Impaired balance/gait - - - -  Follow up Falls  prevention discussed - - - -   FALL RISK PREVENTION PERTAINING TO THE HOME:  Any stairs in or around the home? No  If so, do they handrails? No   Home free of loose throw rugs in walkways, pet beds, electrical cords, etc? Yes  Adequate lighting in your home to reduce risk of falls? Yes   ASSISTIVE DEVICES UTILIZED TO PREVENT FALLS:  Life alert? No  Use of a cane, walker or w/c? Yes  Grab bars in the bathroom? Yes  Shower chair or bench in shower? Yes  Elevated toilet seat or a handicapped toilet? Yes   DME ORDERS:  DME order needed?  No   TIMED UP AND GO:  Was the test performed? No . Pt remained in wheelchair.   Education: Fall risk prevention has been discussed.  Intervention(s) required? No   Depression Screen PHQ 2/9 Scores 07/31/2019 06/02/2019 03/19/2019 02/27/2019  PHQ - 2 Score 0 - - 0  PHQ- 9 Score - - - 0  Exception Documentation - Medical reason Medical reason -  Not completed - - - -    Cognitive Function  6CIT deferred due to cognitive impairment      Immunization History  Administered Date(s) Administered  . Influenza, High Dose Seasonal PF 02/11/2015, 02/14/2016, 02/14/2017, 02/19/2018, 01/27/2019  . Influenza,inj,Quad PF,6+ Mos 02/04/2013  . Influenza-Unspecified 12/31/2013, 02/11/2015  . Moderna SARS-COVID-2 Vaccination 06/24/2019, 07/26/2019  . Pneumococcal Conjugate-13 06/22/2014, 06/22/2014  . Pneumococcal Polysaccharide-23 09/15/2009, 09/15/2009  . Tdap 09/15/2009, 09/15/2009    Qualifies for Shingles Vaccine? Yes . Due for Shingrix. Education has been provided regarding the importance of this vaccine. Pt has been advised to call insurance company to determine out of pocket expense. Advised may also receive vaccine at local pharmacy or Health Dept. Verbalized acceptance and understanding.  Tdap: Up to date  Flu Vaccine: Up to date  Pneumococcal Vaccine: Up to date   Screening Tests Health Maintenance  Topic Date Due  . TETANUS/TDAP   09/16/2019  .  INFLUENZA VACCINE  11/23/2019  . PNA vac Low Risk Adult  Completed   Cancer Screenings:  Colorectal Screening: No longer required.   Lung Cancer Screening: (Low Dose CT Chest recommended if Age 46-80 years, 30 pack-year currently smoking OR have quit w/in 15years.) does not qualify.   Additional Screening:  Hepatitis C Screening: no longer required  Vision Screening: Recommended annual ophthalmology exams for early detection of glaucoma and other disorders of the eye. Is the patient up to date with their annual eye exam?  Yes  Who is the provider or what is the name of the office in which the pt attends annual eye exams? Dr. Matilde Sprang  Dental Screening: Recommended annual dental exams for proper oral hygiene  Community Resource Referral:  CRR required this visit?  No       Plan:    I have personally reviewed and addressed the Medicare Annual Wellness questionnaire and have noted the following in the patient's chart:  A. Medical and social history B. Use of alcohol, tobacco or illicit drugs  C. Current medications and supplements D. Functional ability and status E.  Nutritional status F.  Physical activity G. Advance directives H. List of other physicians I.  Hospitalizations, surgeries, and ER visits in previous 12 months J.  [place] such as hearing and vision if needed, cognitive and depression L. Referrals and appointments   In addition, I have reviewed and discussed with patient certain preventive protocols, quality metrics, and best practice recommendations. A written personalized care plan for preventive services as well as general preventive health recommendations were provided to patient.   Signed,  Clemetine Marker, LPN Nurse Health Advisor   Nurse Notes: pt accompanied to visit today by [name] caregiver [name]. [name] states pt has been complaining of right knee pain and reports the RN wanted to know if he could be referred to ortho  for eval and possible injections. Advised would need to be discussed with Dr. Ancil Boozer.   [name] also requests rx for shingrix to be sent to [facility] in [place].

## 2019-08-01 ENCOUNTER — Telehealth: Payer: Self-pay

## 2019-08-01 DIAGNOSIS — R198 Other specified symptoms and signs involving the digestive system and abdomen: Secondary | ICD-10-CM

## 2019-08-01 NOTE — Telephone Encounter (Signed)
Copied from [place] [phone]. Topic: General - Other >> Jul 31, 2019 12:55 PM Leward Quan A wrote: Reason for CRM: [name] patients care taker called to request a copy of the visit summary from 06/02/19 visit to be faxed to [phone].  She is also requesting a referral sent to [facility] so that patient can start getting his gum checked at this location starting as soon as they get the referral. Ph# [phone]  Referral placed in this encounter.

## 2019-08-07 ENCOUNTER — Other Ambulatory Visit: Payer: Self-pay

## 2019-08-07 ENCOUNTER — Encounter: Payer: Self-pay | Admitting: Family Medicine

## 2019-08-07 ENCOUNTER — Ambulatory Visit (INDEPENDENT_AMBULATORY_CARE_PROVIDER_SITE_OTHER): Payer: Medicare Other | Admitting: Family Medicine

## 2019-08-07 VITALS — BP 120/84 | HR 96 | Temp 97.8°F | Resp 16

## 2019-08-07 DIAGNOSIS — E440 Moderate protein-calorie malnutrition: Secondary | ICD-10-CM | POA: Diagnosis not present

## 2019-08-07 DIAGNOSIS — M1711 Unilateral primary osteoarthritis, right knee: Secondary | ICD-10-CM | POA: Diagnosis not present

## 2019-08-07 NOTE — Progress Notes (Signed)
Name: [name]   MRN: [id number]    DOB: Aug 18, 1940   Date:08/07/2019       Progress Note  Subjective  Chief Complaint  Chief Complaint  Patient presents with  . Knee Pain    Onset-Since January, right knee-will hollar out in pain    HPI  Pain right knee: he moved to a different group home in January and since than he complains of right knee pain, worse at night, they tried placing a pillow under his knee but he does not keep it in place. No redness but has intermittent swelling   Malnutrition: he had COVID back in Nov 2020 and has lost a lot of weight since. Caregiver - [name] - states his appetite is great, he is taking Ensure protein shakes but not gaining much weight. They need to discuss with his guardian what further evaluation they want for him . Caregiver states he is on puree diet, we will contact speech therapy to evaluated if able to progress to chopped diet  Patient Active Problem List   Diagnosis Date Noted  . Personal history of urinary calculi 06/21/2019  . Pressure injury of skin 03/12/2019  . Moderate protein malnutrition ([place]) 03/11/2019  . Pancytopenia ([place]) 03/04/2019  . PAD (peripheral artery disease) ([place]) 07/04/2017  . Aortic valve calcification 07/04/2017  . Coronary artery calcification 07/04/2017  . Hypertension 10/26/2016  . Late effect acute polio 05/17/2016  . Chronic constipation 11/19/2015  . Mild major depression ([place]) 11/19/2015  . Complicated grieving 0000000  . Bacteriuria, chronic 09/11/2015  . History of prostate cancer 03/02/2015  . Amyotrophia 03/02/2015  . Edema leg 03/02/2015  . Cerebral palsy ([place]) 03/02/2015  . Dyslipidemia 03/02/2015  . Dermatitis, eczematoid 03/02/2015  . 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 H/O acute poliomyelitis 03/02/2015  . Lymphedema 03/02/2015  . Intellectual disability 03/02/2015  . Hypertrophy of nail 03/02/2015  . Adiposity 03/02/2015  . Chronic venous insufficiency 03/02/2015  . Avitaminosis D  03/02/2015  . Adynamia 03/02/2015  . Dependent on wheelchair 03/02/2015  . Renal cysts, acquired, bilateral 03/02/2015  . At risk for falling 03/02/2015  . Cerebral vascular disease 03/02/2015  . Strabismus 02/11/2015  . Urge incontinence of urine 02/11/2015  . Wheelchair dependence 02/11/2015  . Obesity 02/11/2015  . Urinary frequency 10/30/2014    Social History   Tobacco Use  . Smoking status: Never Smoker  . Smokeless tobacco: Never Used  Substance Use Topics  . Alcohol use: No    Alcohol/week: 0.0 standard drinks     Current Outpatient Medications:  .  acetaminophen (TYLENOL) 325 MG tablet, TAKE 2 TABLETS (650 MG TOTAL) BY MOUTH EVERY 6 HOURS AS NEEDED FOR MILD PAIN OR HEADACHE., Disp: 60 tablet, Rfl: 0 .  alfuzosin (UROXATRAL) 10 MG 24 hr tablet, Take 1 tablet (10 mg total) by mouth daily., Disp: 30 tablet, Rfl: 5 .  ALPRAZolam (XANAX) 0.25 MG tablet, TAKE ONE TABLET BY MOUTH 2 TIMES A DAY AS NEEDED FOR ANXIETY (WHEN BLOOD PRESSURE IS VERY HIGH OR ANXIOUS), Disp: 20 tablet, Rfl: 0 .  ARTIFICIAL TEARS 0.2-0.2-1 % SOLN, PLACE 2 DROPS INTO EYE 2 TIMES A DAY AS NEEDED, Disp: 15 mL, Rfl: 1 .  ascorbic acid (VITAMIN C) 500 MG tablet, Take 1 tablet (500 mg total) by mouth daily., Disp: 30 tablet, Rfl: 5 .  aspirin (ASPIR-LOW) 81 MG EC tablet, TAKE 1 TABLET BY MOUTH ONCE DAILY, Disp: 30 tablet, Rfl: 5 .  atorvastatin (LIPITOR) 40 MG tablet, Take 1  tablet (40 mg total) by mouth every evening., Disp: 30 tablet, Rfl: 5 .  augmented betamethasone dipropionate (DIPROLENE-AF) 0.05 % cream, APPLY TOPICALLY AS DIRECTED TO RASH AS NEEDED, Disp: 45 g, Rfl: 0 .  busPIRone (BUSPAR) 5 MG tablet, TAKE 1 TABLET BY MOUTH TWICE DAILY, Disp: 180 tablet, Rfl: 0 .  Calcium Carb-Cholecalciferol (CALCIUM-VITAMIN D) 500-400 MG-UNIT TABS, Take 1 tablet by mouth 2 (two) times daily., Disp: 60 tablet, Rfl: 5 .  Cranberry Juice Powder 425 MG CAPS, Take 1 capsule (425 mg total) by mouth daily., Disp: 30  capsule, Rfl: 5 .  Elastic Bandages & Supports (MEDICAL COMPRESSION STOCKINGS) MISC, 1 Units by Does not apply route daily., Disp: 2 each, Rfl: 0 .  Incontinence Supply Disposable (DEPEND SILHOUETTE BRIEFS L/XL) MISC, 1 each by Does not apply route 2 (two) times daily as needed., Disp: 60 each, Rfl: 5 .  lubiprostone (AMITIZA) 24 MCG capsule, Take 1 capsule (24 mcg total) by mouth 2 (two) times daily with a meal., Disp: 60 capsule, Rfl: 5 .  Nutritional Supplements (ENSURE NUTRITION SHAKE) LIQD, Take 1 each by mouth 2 (two) times daily., Disp: 237 mL, Rfl: 30 .  Polyethyl Glycol-Propyl Glycol (SYSTANE ULTRA) 0.4-0.3 % SOLN, Apply 1 drop to eye 2 (two) times daily. Both eyes, Disp: 30 mL, Rfl: 5 .  sertraline (ZOLOFT) 100 MG tablet, Take 1 tablet (100 mg total) by mouth daily., Disp: 30 tablet, Rfl: 5  No Known Allergies  ROS  Ten systems reviewed and is negative except as mentioned in HPI   Objective  Vitals:   08/07/19 1121  BP: 120/84  Pulse: 96  Resp: 16  Temp: 97.8 F (36.6 C)  TempSrc: Temporal  SpO2: 96%    There is no height or weight on file to calculate BMI.    Physical Exam  Constitutional: Patient appears well-developed and well-nourished. No distress.  HEENT: head atraumatic, normocephalic, pupils equal and reactive to light Cardiovascular: Normal rate, regular rhythm and normal heart sounds.  No murmur heard. No BLE edema. Pulmonary/Chest: Effort normal and breath sounds normal. No respiratory distress. Abdominal: Soft.  There is no tenderness. Psychiatric: Patient has a normal mood and affect. behavior is normal. Judgment and thought content normal. Muscular skeletal: mild effusion of right knee, he has CP and some spasticity of legs, he uses a wheelchair but per caregiver he used to walk when younger. No increase in warmth.  Pain with extension of knee and has crepitus   Recent Results (from the past 2160 hour(s))  Urinalysis, Complete w Microscopic      Status: Abnormal   Collection Time: 05/27/19  8:13 PM  Result Value Ref Range   Color, Urine YELLOW (A) YELLOW   APPearance TURBID (A) CLEAR   Specific Gravity, Urine 1.019 1.005 - 1.030   pH 7.0 5.0 - 8.0   Glucose, UA NEGATIVE NEGATIVE mg/dL   Hgb urine dipstick NEGATIVE NEGATIVE   Bilirubin Urine NEGATIVE NEGATIVE   Ketones, ur NEGATIVE NEGATIVE mg/dL   Protein, ur 30 (A) NEGATIVE mg/dL   Nitrite NEGATIVE NEGATIVE   Leukocytes,Ua MODERATE (A) NEGATIVE   RBC / HPF 0-5 0 - 5 RBC/hpf   WBC, UA >50 (H) 0 - 5 WBC/hpf   Bacteria, UA MANY (A) NONE SEEN   Squamous Epithelial / LPF 6-10 0 - 5    Comment: Performed at [facility], [address] [place], [place] [address]  Urinalysis, Complete     Status: Abnormal   Collection Time:  06/20/19 11:41 AM  Result Value Ref Range   Specific Gravity, UA 1.020 1.005 - 1.030   pH, UA 8.5 (H) 5.0 - 7.5   Color, UA Yellow Yellow   Appearance Ur Cloudy (A) Clear   Leukocytes,UA Negative Negative   Protein,UA Negative Negative/Trace   Glucose, UA Negative Negative   Ketones, UA Negative Negative   RBC, UA Negative Negative   Bilirubin, UA Negative Negative   Urobilinogen, Ur 1.0 0.2 - 1.0 mg/dL   Nitrite, UA Negative Negative   Microscopic Examination See below:   CULTURE, URINE COMPREHENSIVE     Status: Abnormal   Collection Time: 06/20/19 11:41 AM   Specimen: Urine   UR  Result Value Ref Range   Urine Culture, Comprehensive Final report (A)    Organism ID, Bacteria Morganella morganii (A)     Comment: Greater than 100,000 colony forming units per mL   ANTIMICROBIAL SUSCEPTIBILITY Comment     Comment:       ** S = Susceptible; I = Intermediate; R = Resistant **                    P = Positive; N = Negative             MICS are expressed in micrograms per mL    Antibiotic                 RSLT#1    RSLT#2    RSLT#3    RSLT#4 Amoxicillin/Clavulanic Acid    R Ampicillin                     R Cefazolin                       R Cefuroxime                     R Ciprofloxacin                  S Ertapenem                      S Gentamicin                     S Imipenem                       I Levofloxacin                   S Meropenem                      S Nitrofurantoin                 R Piperacillin/Tazobactam        S Tetracycline                   R Tobramycin                     S Trimethoprim/Sulfa             S   Microscopic Examination     Status: Abnormal   Collection Time: 06/20/19 11:41 AM   URINE  Result Value Ref Range   WBC, UA 0-5 0 - 5 /hpf   RBC None seen 0 - 2 /hpf   Epithelial Cells (non renal) 0-10 0 - 10 /hpf  Crystals Present (A) N/A   Crystal Type Calcium Oxalate N/A    Comment: Amorphous Sediment   Bacteria, UA Moderate (A) None seen/Few  CBC with Differential     Status: Abnormal   Collection Time: 07/07/19 11:19 AM  Result Value Ref Range   WBC 5.8 4.0 - 10.5 K/uL   RBC 4.16 (L) 4.22 - 5.81 MIL/uL   Hemoglobin 13.6 13.0 - 17.0 g/dL   HCT 40.1 39.0 - 52.0 %   MCV 96.4 80.0 - 100.0 fL   MCH 32.7 26.0 - 34.0 pg   MCHC 33.9 30.0 - 36.0 g/dL   RDW 13.2 11.5 - 15.5 %   Platelets 180 150 - 400 K/uL   nRBC 0.0 0.0 - 0.2 %   Neutrophils Relative % 65 %   Neutro Abs 3.8 1.7 - 7.7 K/uL   Lymphocytes Relative 26 %   Lymphs Abs 1.5 0.7 - 4.0 K/uL   Monocytes Relative 6 %   Monocytes Absolute 0.3 0.1 - 1.0 K/uL   Eosinophils Relative 2 %   Eosinophils Absolute 0.1 0.0 - 0.5 K/uL   Basophils Relative 0 %   Basophils Absolute 0.0 0.0 - 0.1 K/uL   Immature Granulocytes 1 %   Abs Immature Granulocytes 0.03 0.00 - 0.07 K/uL    Comment: Performed at [facility], [address], [place],  [address]     Assessment & Plan    1. Moderate protein malnutrition ([place])  Needs to continue protein shakes, discuss evaluation with next to kin    2. Primary osteoarthritis of right knee   Consent signed: YES  Procedure: Knee Joint Injection Location: right  knee Injection approach: lateral knee Equipment used: 25 gauge 1.5 inch needle Medication: 2 mL Kenalog (40mg /98mL) Anesthesia: 1% Lidocaine w/o Epinephrine Cleaned and prepped: Betadine  The risks, benefits, treatment options discussed with patient prior to procedure.  Consent signed. Area cleansed with sterile betadine. .    Patient tolerated procedure well with no complications and no bleeding. Bandage placed at site of injection. Patient instructed on potential for steroid reaction pain within the initial 24-48hr period. May use ice packs directly on injected site as needed.

## 2019-08-14 ENCOUNTER — Ambulatory Visit: Payer: Medicare Other | Admitting: Family Medicine

## 2019-08-15 ENCOUNTER — Other Ambulatory Visit: Payer: Self-pay | Admitting: Family Medicine

## 2019-08-15 DIAGNOSIS — F419 Anxiety disorder, unspecified: Secondary | ICD-10-CM

## 2019-08-15 NOTE — Telephone Encounter (Signed)
Requested Prescriptions  Pending Prescriptions Disp Refills  . busPIRone (BUSPAR) 5 MG tablet [Pharmacy Med Name: BUSPIRONE HCL 5 MG TAB] 180 tablet 0    Sig: TAKE 1 TABLET BY MOUTH TWICE DAILY     Psychiatry: Anxiolytics/Hypnotics - Non-controlled Passed - 08/15/2019  5:18 PM      Passed - Valid encounter within last 6 months    Recent Outpatient Visits          1 week ago Moderate protein malnutrition [facility])   [facility] Steele Sizer, MD   2 months ago Gait instability   [facility] Steele Sizer, MD   4 months ago Hospital discharge follow-up   [facility] Steele Sizer, MD   5 months ago Neutropenia, unspecified type [facility])   [facility] Steele Sizer, MD   10 months ago Foul smelling urine   [place], [place]      Future Appointments            In 2 weeks Steele Sizer, MD [facility], [place]   In 11 months  [facility], [facility]

## 2019-08-24 DIAGNOSIS — G809 Cerebral palsy, unspecified: Secondary | ICD-10-CM | POA: Diagnosis not present

## 2019-08-29 ENCOUNTER — Other Ambulatory Visit: Payer: Self-pay

## 2019-08-29 ENCOUNTER — Encounter: Payer: Self-pay | Admitting: Family Medicine

## 2019-08-29 ENCOUNTER — Ambulatory Visit (INDEPENDENT_AMBULATORY_CARE_PROVIDER_SITE_OTHER): Payer: Medicare Other | Admitting: Family Medicine

## 2019-08-29 VITALS — BP 78/50 | HR 55 | Temp 96.9°F | Resp 16

## 2019-08-29 DIAGNOSIS — G809 Cerebral palsy, unspecified: Secondary | ICD-10-CM | POA: Diagnosis not present

## 2019-08-29 DIAGNOSIS — R2681 Unsteadiness on feet: Secondary | ICD-10-CM

## 2019-08-29 DIAGNOSIS — I959 Hypotension, unspecified: Secondary | ICD-10-CM

## 2019-08-29 DIAGNOSIS — R600 Localized edema: Secondary | ICD-10-CM

## 2019-08-29 DIAGNOSIS — E559 Vitamin D deficiency, unspecified: Secondary | ICD-10-CM | POA: Diagnosis not present

## 2019-08-29 DIAGNOSIS — E538 Deficiency of other specified B group vitamins: Secondary | ICD-10-CM

## 2019-08-29 DIAGNOSIS — Z8546 Personal history of malignant neoplasm of prostate: Secondary | ICD-10-CM

## 2019-08-29 DIAGNOSIS — E441 Mild protein-calorie malnutrition: Secondary | ICD-10-CM

## 2019-08-29 DIAGNOSIS — R198 Other specified symptoms and signs involving the digestive system and abdomen: Secondary | ICD-10-CM | POA: Diagnosis not present

## 2019-08-29 DIAGNOSIS — I700 Atherosclerosis of aorta: Secondary | ICD-10-CM

## 2019-08-29 NOTE — Progress Notes (Addendum)
Name: [name]   MRN: [id number]    DOB: 1941-01-08   Date:08/29/2019       Progress Note  Subjective  Chief Complaint  Chief Complaint  Patient presents with  . Depression  . Neutropenia  . Hyperglycemia  . Gait Problem    HPI   Guardian is his nephew: [name] # [phone] and [name] his niece  She came in today with [name] from [name].  [facility] is at an [facility] facility since [facility]   He has MR, poor historian.He has CP, he has been getting progressively weaker since last year. Worse when he had COVID-19 Fall 2020. Wheelchair bound now   Lower leg edema/PA: since he is at the new facility since 04/28/2019, he has his pumps to wear at night again, also wearing compression stocking hoses and is doing well at this time  Atherosclerosis aorta, and coronary arteries with also calcified aortic valve, he does not seem to have chest pain, buthe is on medical management, taking aspirin and atorvastatin, reviewed last labs , low HDL   Depression: he is taking Zoloft daily and sees [name] , we added Buspar for agitation and is doing better at night.   History of kidney stones: seeing [name] and had a cystoscopy, right ureteroscopy and stone removaland stent placement, urinary incontinence has improved, he still has depends. He also a  History of bed sores: resolved - per caregiver   History of prostate cancer: had brachytherapy in 2010 , was seeing [name], currently under the care of [name]. I am unable to see [name] note  Constipation chronic: doing well, no straining, we will continue Amitiza he is off miralax and not having any problems with bowel movements  Leucopenia and thrombocytopenia: saw Dr. Janese Banks and last levels improved , he has been released from her care   Malnutrition: lost a lot of weight over the past year , back in January 2020 his weight was 189 lbs, during hospital stay back in Nov weight was 150 lbs. His  clothes are baggy and shirts are very big on him, last visit to [facility] states weight 180 lbs but not sure if it was an estimated weight. He still has temporal waisting, and is so weak that cannot walk. Caregiver states able to eat, but still on puree diet, we will try to get speech therapist to evaluate again, okay to have high calorie and desserts to try to gain weight.   Gum disease: referral placed last visit to school of dentrist but he usually goes to the dental clinic, she will contact them and see if they can see the referral.    Patient Active Problem List   Diagnosis Date Noted  . Personal history of urinary calculi 06/21/2019  . PAD (peripheral artery disease) ([place]) 07/04/2017  . Aortic valve calcification 07/04/2017  . Coronary artery calcification 07/04/2017  . Late effect acute polio 05/17/2016  . Chronic constipation 11/19/2015  . Mild major depression ([place]) 11/19/2015  . Bacteriuria, chronic 09/11/2015  . History of prostate cancer 03/02/2015  . Amyotrophia 03/02/2015  . Edema leg 03/02/2015  . Cerebral palsy ([place]) 03/02/2015  . Dyslipidemia 03/02/2015  . Dermatitis, eczematoid 03/02/2015  . 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 H/O acute poliomyelitis 03/02/2015  . Lymphedema 03/02/2015  . Intellectual disability 03/02/2015  . Hypertrophy of nail 03/02/2015  . Chronic venous insufficiency 03/02/2015  . Avitaminosis D 03/02/2015  . Adynamia 03/02/2015  . Dependent on wheelchair 03/02/2015  .  Renal cysts, acquired, bilateral 03/02/2015  . At risk for falling 03/02/2015  . Cerebral vascular disease 03/02/2015  . Strabismus 02/11/2015  . Urge incontinence of urine 02/11/2015  . Wheelchair dependence 02/11/2015  . Urinary frequency 10/30/2014    Past Surgical History:  Procedure Laterality Date  . CYSTOSCOPY/URETEROSCOPY/HOLMIUM LASER/STENT PLACEMENT Right 08/14/2017   Procedure: CYSTOSCOPY/URETEROSCOPY/HOLMIUM LASER/STENT PLACEMENT;  Surgeon: Kevin Sons, MD;   Location: [facility] ORS;  Service: Urology;  Laterality: Right;  . leg circulation surgery Right   . PROSTATE SURGERY  11/10/2008   BRACHYTHERAPY    Family History  Problem Relation Age of Onset  . Heart attack Brother   . Heart disease Other     Social History   Tobacco Use  . Smoking status: Never Smoker  . Smokeless tobacco: Never Used  Substance Use Topics  . Alcohol use: No    Alcohol/week: 0.0 standard drinks     Current Outpatient Medications:  .  acetaminophen (TYLENOL) 325 MG tablet, TAKE 2 TABLETS (650 MG TOTAL) BY MOUTH EVERY 6 HOURS AS NEEDED FOR MILD PAIN OR HEADACHE., Disp: 60 tablet, Rfl: 0 .  alfuzosin (UROXATRAL) 10 MG 24 hr tablet, Take 1 tablet (10 mg total) by mouth daily., Disp: 30 tablet, Rfl: 5 .  ALPRAZolam (XANAX) 0.25 MG tablet, TAKE ONE TABLET BY MOUTH 2 TIMES A DAY AS NEEDED FOR ANXIETY (WHEN BLOOD PRESSURE IS VERY HIGH OR ANXIOUS), Disp: 20 tablet, Rfl: 0 .  ARTIFICIAL TEARS 0.2-0.2-1 % SOLN, PLACE 2 DROPS INTO EYE 2 TIMES A DAY AS NEEDED, Disp: 15 mL, Rfl: 1 .  ascorbic acid (VITAMIN C) 500 MG tablet, Take 1 tablet (500 mg total) by mouth daily., Disp: 30 tablet, Rfl: 5 .  aspirin (ASPIR-LOW) 81 MG EC tablet, TAKE 1 TABLET BY MOUTH ONCE DAILY, Disp: 30 tablet, Rfl: 5 .  atorvastatin (LIPITOR) 40 MG tablet, Take 1 tablet (40 mg total) by mouth every evening., Disp: 30 tablet, Rfl: 5 .  augmented betamethasone dipropionate (DIPROLENE-AF) 0.05 % cream, APPLY TOPICALLY AS DIRECTED TO RASH AS NEEDED, Disp: 45 g, Rfl: 0 .  busPIRone (BUSPAR) 5 MG tablet, TAKE 1 TABLET BY MOUTH TWICE DAILY, Disp: 180 tablet, Rfl: 0 .  Calcium Carb-Cholecalciferol (CALCIUM-VITAMIN D) 500-400 MG-UNIT TABS, Take 1 tablet by mouth 2 (two) times daily., Disp: 60 tablet, Rfl: 5 .  Cranberry Juice Powder 425 MG CAPS, Take 1 capsule (425 mg total) by mouth daily., Disp: 30 capsule, Rfl: 5 .  Elastic Bandages & Supports (MEDICAL COMPRESSION STOCKINGS) MISC, 1 Units by Does not apply route  daily., Disp: 2 each, Rfl: 0 .  Incontinence Supply Disposable (DEPEND SILHOUETTE BRIEFS L/XL) MISC, 1 each by Does not apply route 2 (two) times daily as needed., Disp: 60 each, Rfl: 5 .  lubiprostone (AMITIZA) 24 MCG capsule, Take 1 capsule (24 mcg total) by mouth 2 (two) times daily with a meal., Disp: 60 capsule, Rfl: 5 .  Nutritional Supplements (ENSURE NUTRITION SHAKE) LIQD, Take 1 each by mouth 2 (two) times daily., Disp: 237 mL, Rfl: 30 .  Polyethyl Glycol-Propyl Glycol (SYSTANE ULTRA) 0.4-0.3 % SOLN, Apply 1 drop to eye 2 (two) times daily. Both eyes, Disp: 30 mL, Rfl: 5 .  sertraline (ZOLOFT) 100 MG tablet, Take 1 tablet (100 mg total) by mouth daily., Disp: 30 tablet, Rfl: 5  No Known Allergies  I personally reviewed active problem list, medication list, allergies, family history, social history, health maintenance with the patient/caregiver today.   ROS  Ten  systems reviewed and is negative except as mentioned in HPI   Objective  Vitals:   08/29/19 1013 08/29/19 1016  BP: (!) 70/50 (!) 78/50  Pulse: (!) 55   Resp: 16   Temp: (!) 96.9 F (36.1 C)   TempSrc: Temporal   SpO2: 98%     There is no height or weight on file to calculate BMI.  Physical Exam  Constitutional: Patient appears calm  temporal waisting  No distress.  HEENT: head atraumatic, normocephalic, strabismus,  neck supple Cardiovascular: Normal rate, regular rhythm and normal heart sounds.  No murmur heard. No BLE edema. Pulmonary/Chest: Effort normal and breath sounds normal. No respiratory distress. Abdominal: Soft.  There is no tenderness. Psychiatric: Patient very quiet today, cooperative   PHQ2/9: Depression screen [facility] 2/9 08/07/2019 07/31/2019 02/27/2019 10/03/2018 08/20/2018  Decreased Interest 0 0 0 0 0  Down, Depressed, Hopeless 0 0 0 0 0  PHQ - 2 Score 0 0 0 0 0  Altered sleeping 0 - 0 0 0  Tired, decreased energy 0 - 0 0 0  Change in appetite 0 - 0 0 0  Feeling bad or failure about yourself   0 - 0 0 0  Trouble concentrating 0 - 0 0 0  Moving slowly or fidgety/restless 0 - 0 0 0  Suicidal thoughts 0 - 0 0 0  PHQ-9 Score 0 - 0 0 0  Difficult doing work/chores Not difficult at all - - - Not difficult at all  Some recent data might be hidden    phq 9 is negative   Fall Risk: Fall Risk  08/29/2019 08/07/2019 07/31/2019 06/02/2019 03/19/2019  Falls in the past year? 0 1 1 1  0  Number falls in past yr: 0 1 1 0 0  Injury with Fall? 0 1 1 0 0  Risk Factor Category  - - - - -  Risk for fall due to : - Impaired balance/gait;Impaired mobility;History of fall(s) Impaired mobility;Impaired balance/gait - -  Follow up - - Falls prevention discussed - -      Functional Status Survey: Is the patient deaf or have difficulty hearing?: No Does the patient have difficulty seeing, even when wearing glasses/contacts?: No Does the patient have difficulty concentrating, remembering, or making decisions?: Yes Does the patient have difficulty walking or climbing stairs?: Yes Does the patient have difficulty dressing or bathing?: Yes Does the patient have difficulty doing errands alone such as visiting a doctor's office or shopping?: Yes    Assessment & Plan  1. Gum symptoms  Follow up with dental   2. Mild protein-calorie malnutrition (HCC)  - COMPLETE METABOLIC PANEL WITH GFR  3. Bilateral edema of lower extremity  stable  4. Gait instability  Wheelchair bound  5. Cerebral palsy, unspecified type ([place])  Still has atrophy and also some contractures   6. Atherosclerosis of aorta (HCC)  On statin and aspirin   7. History of prostate cancer  Under the care of [name]   8. B12 deficiency  - Vitamin B12  9. Vitamin D deficiency  - VITAMIN D 25 Hydroxy (Vit-D Deficiency, Fractures)  10. Hypotension, unspecified hypotension type  Per caregiver also at the home, but stable

## 2019-08-29 NOTE — Patient Instructions (Signed)
Referral has been sent to [facility]  P: [phone] F: [phone]  Release ID # [id number]

## 2019-08-30 LAB — COMPLETE METABOLIC PANEL WITH GFR
AG Ratio: 1.6 (calc) (ref 1.0–2.5)
ALT: 20 U/L (ref 9–46)
AST: 14 U/L (ref 10–35)
Albumin: 3.6 g/dL (ref 3.6–5.1)
Alkaline phosphatase (APISO): 105 U/L (ref 35–144)
BUN/Creatinine Ratio: 30 (calc) — ABNORMAL HIGH (ref 6–22)
BUN: 16 mg/dL (ref 7–25)
CO2: 26 mmol/L (ref 20–32)
Calcium: 9.1 mg/dL (ref 8.6–10.3)
Chloride: 108 mmol/L (ref 98–110)
Creat: 0.54 mg/dL — ABNORMAL LOW (ref 0.70–1.18)
GFR, Est African American: 118 mL/min/{1.73_m2} (ref >=60)
GFR, Est Non African American: 102 mL/min/{1.73_m2} (ref >=60)
Globulin: 2.2 g/dL (calc) (ref 1.9–3.7)
Glucose, Bld: 89 mg/dL (ref 65–99)
Potassium: 3.9 mmol/L (ref 3.5–5.3)
Sodium: 137 mmol/L (ref 135–146)
Total Bilirubin: 0.5 mg/dL (ref 0.2–1.2)
Total Protein: 5.8 g/dL — ABNORMAL LOW (ref 6.1–8.1)

## 2019-08-30 LAB — VITAMIN D 25 HYDROXY (VIT D DEFICIENCY, FRACTURES): Vit D, 25-Hydroxy: 38 ng/mL (ref 30–100)

## 2019-08-30 LAB — VITAMIN B12: Vitamin B-12: 267 pg/mL (ref 200–1100)

## 2019-09-09 DIAGNOSIS — G809 Cerebral palsy, unspecified: Secondary | ICD-10-CM | POA: Diagnosis not present

## 2019-09-09 DIAGNOSIS — L89300 Pressure ulcer of unspecified buttock, unstageable: Secondary | ICD-10-CM | POA: Diagnosis not present

## 2019-09-24 DIAGNOSIS — G809 Cerebral palsy, unspecified: Secondary | ICD-10-CM | POA: Diagnosis not present

## 2019-10-10 DIAGNOSIS — L89300 Pressure ulcer of unspecified buttock, unstageable: Secondary | ICD-10-CM | POA: Diagnosis not present

## 2019-10-10 DIAGNOSIS — G809 Cerebral palsy, unspecified: Secondary | ICD-10-CM | POA: Diagnosis not present

## 2019-10-21 ENCOUNTER — Other Ambulatory Visit: Payer: Self-pay | Admitting: Family Medicine

## 2019-10-21 NOTE — Telephone Encounter (Signed)
Requested Prescriptions  Pending Prescriptions Disp Refills  . ascorbic acid (VITAMIN C) 500 MG tablet [Pharmacy Med Name: ASCORBIC ACID 500 MG TAB] 30 tablet 4    Sig: TAKE ONE TABLET BY MOUTH ONCE DAILY     Endocrinology:  Vitamins Passed - 10/21/2019  1:36 PM      Passed - Valid encounter within last 12 months    Recent Outpatient Visits          1 month ago Gum symptoms   [facility] [place], Drue Stager, MD   2 months ago Moderate protein malnutrition [facility])   [facility] Steele Sizer, MD   4 months ago Gait instability   [facility] Steele Sizer, MD   7 months ago Hospital discharge follow-up   [facility] Steele Sizer, MD   7 months ago Neutropenia, unspecified type [facility]   [facility] Steele Sizer, MD      Future Appointments            In 4 months Steele Sizer, MD [facility] [place]   In 9 months  [facility]           phone call to [place] to inquire about refill request; refill was sent 05/27/19 #30 with 5 refills, at that time. Spoke with Jackelyn Poling; she explained that the pt. Just rec'd a refill of this medication, and that the pharmacy is working ahead, because the medication is refilled in large packs, and they want to be sure the medication is avail. When pt. Needs it. Michaell Cowing that nurse understood.  Refilled #30 with RF x 4 at this time; pt. Is due for 6 mo. F/u appt. In Nov. 2021.

## 2019-10-24 DIAGNOSIS — G809 Cerebral palsy, unspecified: Secondary | ICD-10-CM | POA: Diagnosis not present

## 2019-11-09 DIAGNOSIS — L89300 Pressure ulcer of unspecified buttock, unstageable: Secondary | ICD-10-CM | POA: Diagnosis not present

## 2019-11-09 DIAGNOSIS — G809 Cerebral palsy, unspecified: Secondary | ICD-10-CM | POA: Diagnosis not present

## 2019-11-18 ENCOUNTER — Other Ambulatory Visit: Payer: Self-pay | Admitting: Family Medicine

## 2019-11-18 DIAGNOSIS — I700 Atherosclerosis of aorta: Secondary | ICD-10-CM

## 2019-11-18 DIAGNOSIS — R350 Frequency of micturition: Secondary | ICD-10-CM

## 2019-11-18 DIAGNOSIS — F419 Anxiety disorder, unspecified: Secondary | ICD-10-CM

## 2019-11-18 DIAGNOSIS — F320 Major depressive disorder, single episode, mild: Secondary | ICD-10-CM

## 2019-11-18 DIAGNOSIS — E441 Mild protein-calorie malnutrition: Secondary | ICD-10-CM

## 2019-11-18 DIAGNOSIS — K5909 Other constipation: Secondary | ICD-10-CM

## 2019-11-18 DIAGNOSIS — N390 Urinary tract infection, site not specified: Secondary | ICD-10-CM

## 2019-11-18 NOTE — Telephone Encounter (Signed)
Requested Prescriptions  Pending Prescriptions Disp Refills  . Calcium Carb-Cholecalciferol (CALCIUM-VITAMIN D) 500-400 MG-UNIT TABS [Pharmacy Med Name: CALCIUM-VITAMIN D 500-400 MG-UNIT T] 180 tablet 0    Sig: TAKE 1 TABLET BY MOUTH 2 TIMES A DAY     Endocrinology: Minerals - Calcium Supplementation with Vitamin D Passed - 11/18/2019 11:30 AM      Passed - Phosphate in normal range and within 360 days    Phosphorus  Date Value Ref Range Status  03/11/2019 2.5 2.5 - 4.6 mg/dL Final    Comment:    Performed at [facility], [place] [address], [place],  [address]         Passed - Ca in normal range and within 360 days    Calcium  Date Value Ref Range Status  08/29/2019 9.1 8.6 - 10.3 mg/dL Final   Calcium, Total  Date Value Ref Range Status  08/14/2013 8.4 (L) 8.5 - 10.1 mg/dL Final         Passed - Vitamin D in normal range and within 360 days    Vit D, 25-Hydroxy  Date Value Ref Range Status  08/29/2019 38 30 - 100 ng/mL Final    Comment:    Vitamin D Status         25-OH Vitamin D: . Deficiency:                    <20 ng/mL Insufficiency:             20 - 29 ng/mL Optimal:                 > or = 30 ng/mL . For 25-OH Vitamin D testing on patients on  D2-supplementation and patients for whom quantitation  of D2 and D3 fractions is required, the [employer]ure[employer](TM) 25-OH VIT D, (D2,D3), LC/MS/MS is recommended: order  code [phone] (patients >93yrs). See Note 1 . Note 1 . For additional information, please refer to  http://education.Quest[employer]ostics.com/faq/FAQ199  (This link is being provided for informational/ educational purposes only.)          Passed - Valid encounter within last 12 months    Recent Outpatient Visits          2 months ago Gum symptoms   [facility], Drue Stager, MD   3 months ago Moderate protein malnutrition [facility])   [facility] Steele Sizer, MD   5 months ago Gait instability    [facility] Steele Sizer, MD   8 months ago Hospital discharge follow-up   [facility] Steele Sizer, MD   8 months ago Neutropenia, unspecified type [facility])   [facility] Steele Sizer, MD      Future Appointments            In 3 months [facility] Boo[facility], Drue Stager, MD [facility], [place]   In 8 months  [facility]

## 2019-11-24 DIAGNOSIS — G809 Cerebral palsy, unspecified: Secondary | ICD-10-CM | POA: Diagnosis not present

## 2019-12-10 DIAGNOSIS — G809 Cerebral palsy, unspecified: Secondary | ICD-10-CM | POA: Diagnosis not present

## 2019-12-10 DIAGNOSIS — L89300 Pressure ulcer of unspecified buttock, unstageable: Secondary | ICD-10-CM | POA: Diagnosis not present

## 2019-12-16 IMAGING — CR ABDOMEN - 1 VIEW
1 series · 2 of 2 positions shown · non-contrast
Comparison: 02/04/2018.

CLINICAL DATA: Back pain and foul-smelling urine.

EXAM:
ABDOMEN - 1 VIEW

[Series 1: dg abd 1 view · 0.14mm/px · 2 of 2 slices shown]
[im 1/2]
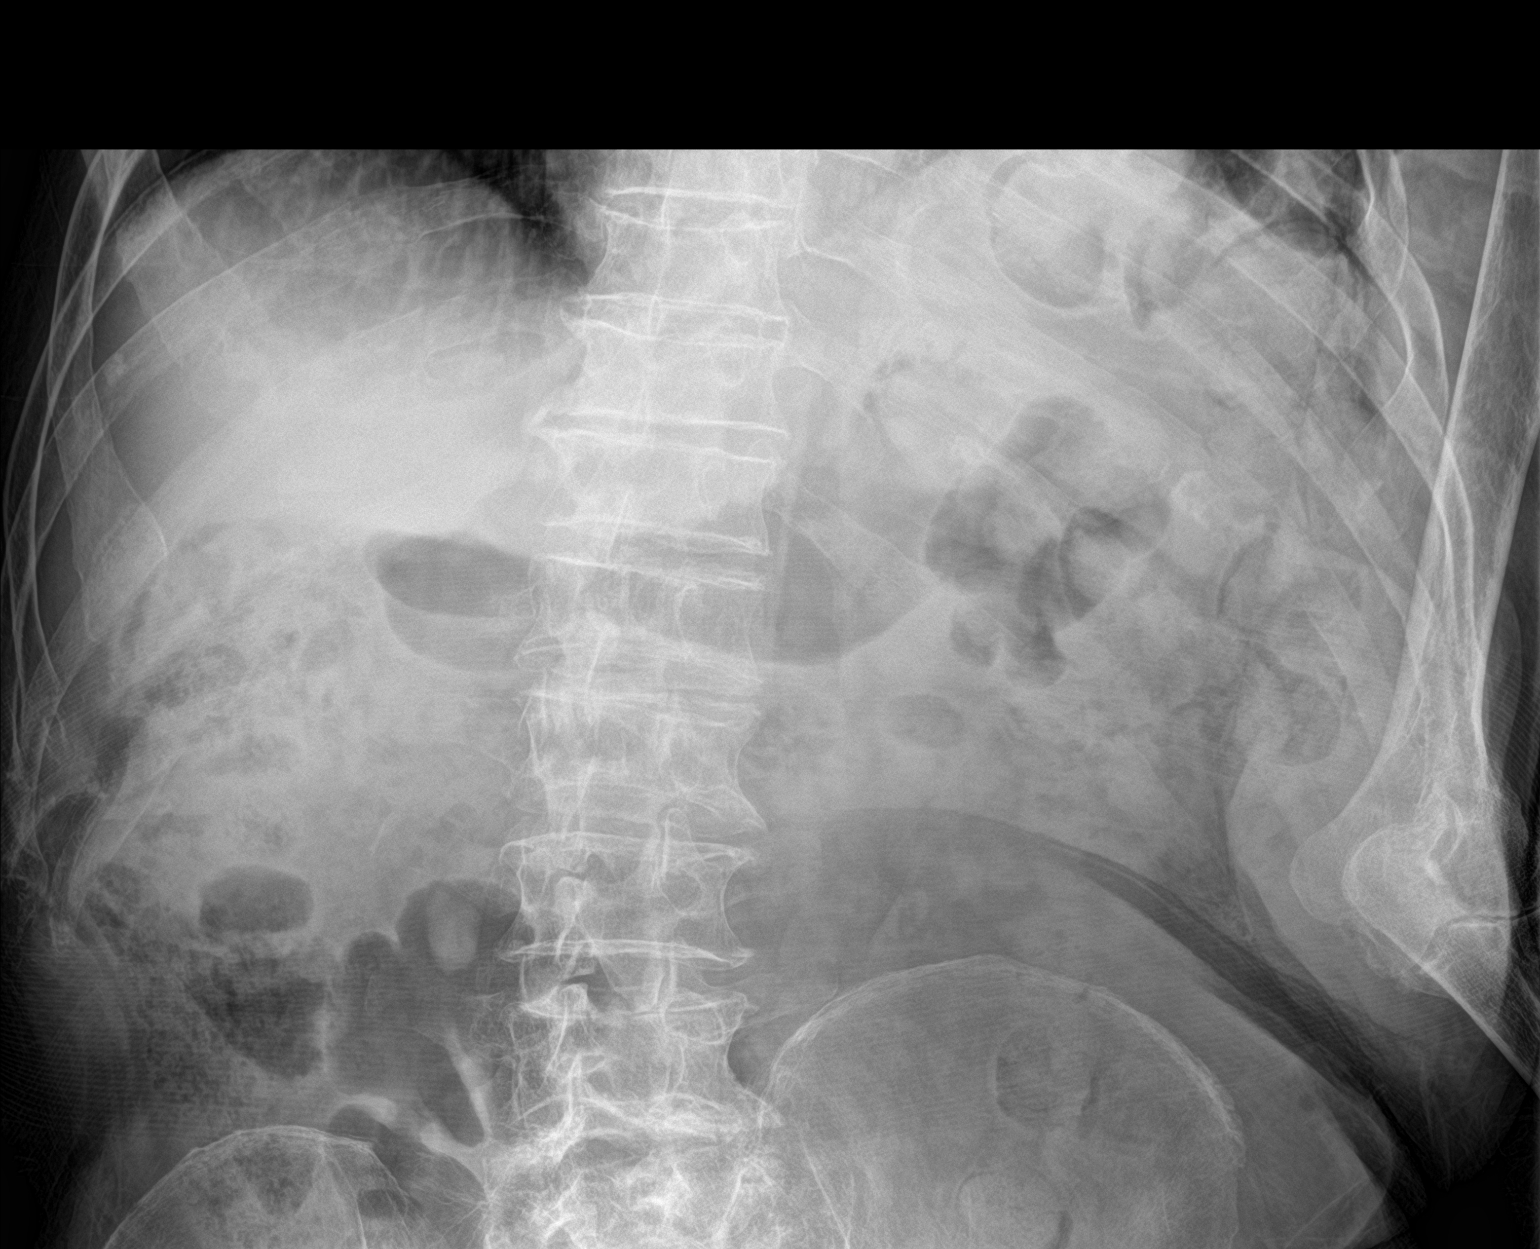
[im 2/2]
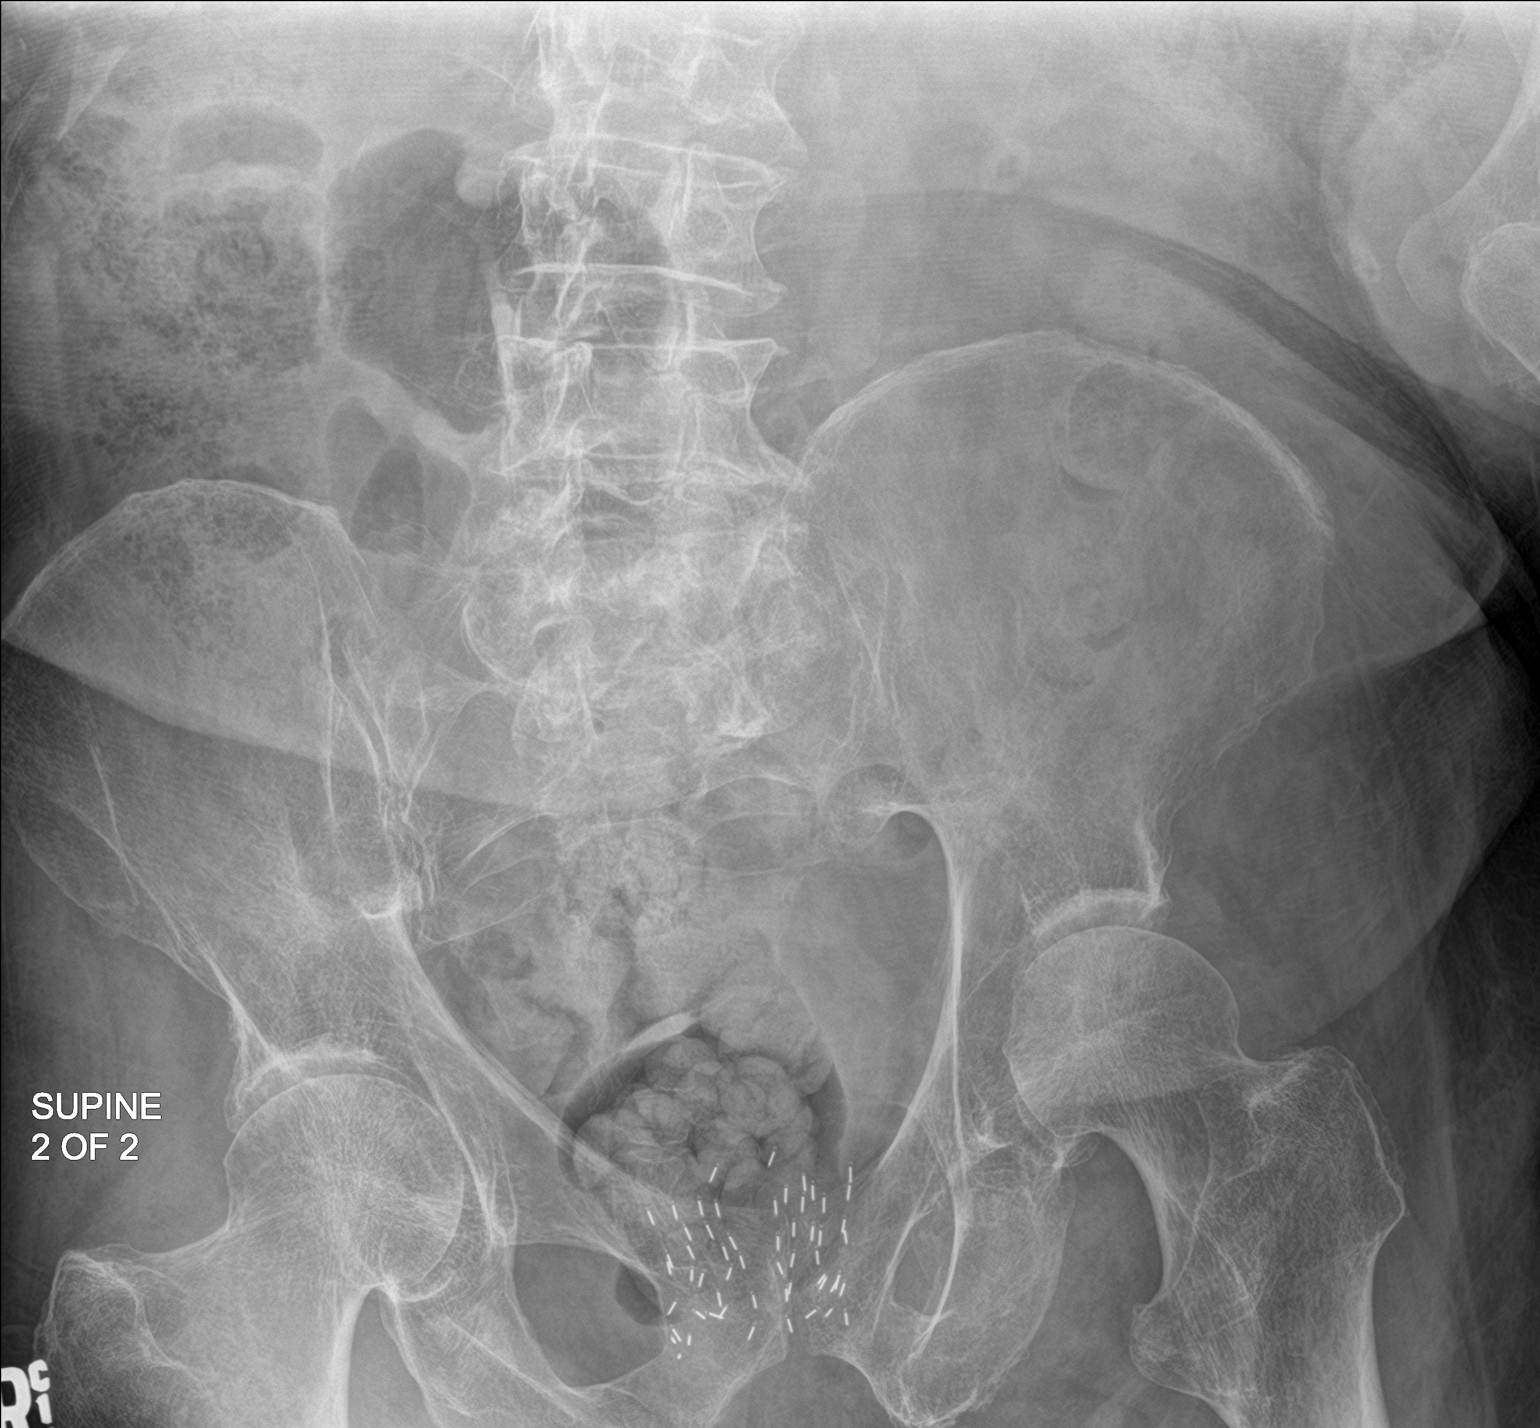

[2 of 2 positions shown; findings below may reference images not displayed]

FINDINGS: The bowel gas pattern is normal. No radio-opaque calculi or other
significant radiographic abnormality are seen.Radioactive prostate
seeds. Moderate stool burden. Degenerative change lumbar spine.
Stable appearance from priors.
IMPRESSION: Moderate stool burden. Correlate clinically for constipation. Post
treatment sequelae for prostate cancer.

## 2019-12-25 DIAGNOSIS — G809 Cerebral palsy, unspecified: Secondary | ICD-10-CM | POA: Diagnosis not present

## 2020-01-10 DIAGNOSIS — L89300 Pressure ulcer of unspecified buttock, unstageable: Secondary | ICD-10-CM | POA: Diagnosis not present

## 2020-01-10 DIAGNOSIS — G809 Cerebral palsy, unspecified: Secondary | ICD-10-CM | POA: Diagnosis not present

## 2020-01-16 ENCOUNTER — Ambulatory Visit: Payer: Self-pay

## 2020-01-16 DIAGNOSIS — N3001 Acute cystitis with hematuria: Secondary | ICD-10-CM | POA: Diagnosis not present

## 2020-01-16 DIAGNOSIS — R319 Hematuria, unspecified: Secondary | ICD-10-CM | POA: Diagnosis not present

## 2020-01-16 NOTE — Telephone Encounter (Signed)
Pt.'s caregiver(Barbara Harris) noticed last night that pt.'s urine is bloody, cloudy, has foul odor. No pain.Concerned he could have a UTI. No availabilty in the practice per agent. Will go to UC. Caregiver requests appointment for next week for follow up. Appointment made.  Reason for Disposition . Bad or foul-smelling urine  Answer Assessment - Initial Assessment Questions 1. SYMPTOM: "What's the main symptom you're concerned about?" (e.g., frequency, incontinence)     Foul urine, blood urine 2. ONSET: "When did the  blood start?"     Last night 3. PAIN: "Is there any pain?" If Yes, ask: "How bad is it?" (Scale: 1-10; mild, moderate, severe)     No 4. CAUSE: "What do you think is causing the symptoms?"     Maybe UTI 5. OTHER SYMPTOMS: "Do you have any other symptoms?" (e.g., fever, flank pain, blood in urine, pain with urination)     Cloudy 6. PREGNANCY: "Is there any chance you are pregnant?" "When was your last menstrual period?"     n/a  Protocols used: URINARY [facility]

## 2020-01-20 ENCOUNTER — Encounter: Payer: Self-pay | Admitting: Family Medicine

## 2020-01-20 ENCOUNTER — Ambulatory Visit (INDEPENDENT_AMBULATORY_CARE_PROVIDER_SITE_OTHER): Payer: Medicare Other | Admitting: Family Medicine

## 2020-01-20 ENCOUNTER — Other Ambulatory Visit: Payer: Self-pay

## 2020-01-20 VITALS — BP 72/54 | HR 53 | Temp 98.1°F | Resp 12 | Ht 70.0 in

## 2020-01-20 DIAGNOSIS — N390 Urinary tract infection, site not specified: Secondary | ICD-10-CM | POA: Diagnosis not present

## 2020-01-20 DIAGNOSIS — R627 Adult failure to thrive: Secondary | ICD-10-CM | POA: Diagnosis not present

## 2020-01-20 DIAGNOSIS — Z23 Encounter for immunization: Secondary | ICD-10-CM | POA: Diagnosis not present

## 2020-01-20 DIAGNOSIS — E440 Moderate protein-calorie malnutrition: Secondary | ICD-10-CM

## 2020-01-20 NOTE — Patient Instructions (Signed)

## 2020-01-20 NOTE — Progress Notes (Signed)
Name: [name]   MRN: [id number]    DOB: September 29, 1940   Date:01/20/2020       Progress Note  Subjective  Chief Complaint  Follow up UTI  HPI   Guardian is his nephew: [name] # [phone] and [name] his niece  She came in today with [name] from [name].  [facility] is at an [facility] facility since 04/28/2019   Recurrent UTI: [name] noticed dark urine 5 days ago, the following day his urine was dark brown they took him to [facility] , he had an urine dip and was sent home on Bactrim , he never started because she was waiting for the urine culture result, but looks like the culture was not done ( even though caregiver took more urine) . His behavior is normal, his urine has been clear, normal appetite, he denies any abdominal pain , no change in bowel movements.   Malnutrition: last weight from [facility] was 135 lbs, caregiver will check his weight at the day program with wheelchair and weight the wheelchair separately so we can have an accurate weight for him. He has a good appetite, but keeps losing weight and looks very malnourished , he has prostate cancer but last PSA was <0.1 We will send order for the dietician   Patient Active Problem List   Diagnosis Date Noted  . Personal history of urinary calculi 06/21/2019  . PAD (peripheral artery disease) ([name]) 07/04/2017  . Aortic valve calcification 07/04/2017  . Coronary artery calcification 07/04/2017  . Late effect acute polio 05/17/2016  . Chronic constipation 11/19/2015  . Mild major depression ([place]) 11/19/2015  . Bacteriuria, chronic 09/11/2015  . History of prostate cancer 03/02/2015  . Amyotrophia 03/02/2015  . Edema leg 03/02/2015  . Cerebral palsy ([place]) 03/02/2015  . Dyslipidemia 03/02/2015  . Dermatitis, eczematoid 03/02/2015  . 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 H/O acute poliomyelitis 03/02/2015  . Lymphedema 03/02/2015  . Intellectual disability 03/02/2015  . Hypertrophy of nail 03/02/2015  . Chronic  venous insufficiency 03/02/2015  . Avitaminosis D 03/02/2015  . Adynamia 03/02/2015  . Dependent on wheelchair 03/02/2015  . Renal cysts, acquired, bilateral 03/02/2015  . At risk for falling 03/02/2015  . Cerebral vascular disease 03/02/2015  . Strabismus 02/11/2015  . Urge incontinence of urine 02/11/2015  . Wheelchair dependence 02/11/2015  . Urinary frequency 10/30/2014    Past Surgical History:  Procedure Laterality Date  . CYSTOSCOPY/URETEROSCOPY/HOLMIUM LASER/STENT PLACEMENT Right 08/14/2017   Procedure: CYSTOSCOPY/URETEROSCOPY/HOLMIUM LASER/STENT PLACEMENT;  Surgeon: Abbie Sons, MD;  Location: [facility] ORS;  Service: Urology;  Laterality: Right;  . leg circulation surgery Right   . PROSTATE SURGERY  11/10/2008   BRACHYTHERAPY    Family History  Problem Relation Age of Onset  . Heart attack Brother   . Heart disease Other     Social History   Tobacco Use  . Smoking status: Never Smoker  . Smokeless tobacco: Never Used  Substance Use Topics  . Alcohol use: No    Alcohol/week: 0.0 standard drinks     Current Outpatient Medications:  .  acetaminophen (TYLENOL) 325 MG tablet, TAKE 2 TABLETS (650 MG TOTAL) BY MOUTH EVERY 6 HOURS AS NEEDED FOR MILD PAIN OR HEADACHE., Disp: 60 tablet, Rfl: 0 .  alfuzosin (UROXATRAL) 10 MG 24 hr tablet, TAKE 1 TABLET BY MOUTH ONCE DAILY, Disp: 90 tablet, Rfl: 1 .  ALPRAZolam (XANAX) 0.25 MG tablet, TAKE ONE TABLET BY MOUTH 2 TIMES A DAY AS NEEDED FOR ANXIETY (WHEN  BLOOD PRESSURE IS VERY HIGH OR ANXIOUS), Disp: 20 tablet, Rfl: 0 .  ARTIFICIAL TEARS 0.2-0.2-1 % SOLN, PLACE 2 DROPS INTO EYE 2 TIMES A DAY AS NEEDED, Disp: 15 mL, Rfl: 1 .  ascorbic acid (VITAMIN C) 500 MG tablet, TAKE ONE TABLET BY MOUTH ONCE DAILY, Disp: 30 tablet, Rfl: 4 .  aspirin (ASPIR-LOW) 81 MG EC tablet, TAKE 1 TABLET BY MOUTH ONCE DAILY, Disp: 90 tablet, Rfl: 1 .  atorvastatin (LIPITOR) 40 MG tablet, TAKE 1 TABLET BY MOUTH EVERY EVENING, Disp: 90 tablet, Rfl: 1 .   augmented betamethasone dipropionate (DIPROLENE-AF) 0.05 % cream, APPLY TOPICALLY AS DIRECTED TO RASH AS NEEDED, Disp: 45 g, Rfl: 0 .  busPIRone (BUSPAR) 5 MG tablet, TAKE 1 TABLET BY MOUTH TWICE DAILY, Disp: 180 tablet, Rfl: 0 .  Calcium Carb-Cholecalciferol (CALCIUM-VITAMIN D) 500-400 MG-UNIT TABS, TAKE 1 TABLET BY MOUTH 2 TIMES A DAY, Disp: 180 tablet, Rfl: 0 .  calcium carbonate (OS-CAL) 1250 (500 Ca) MG chewable tablet, Chew by mouth., Disp: , Rfl:  .  Cranberry Juice Powder 425 MG CAPS, TAKE 1 CAPSULE BY MOUTH ONCE DAILY, Disp: 90 capsule, Rfl: 1 .  Elastic Bandages & Supports (MEDICAL COMPRESSION STOCKINGS) MISC, 1 Units by Does not apply route daily., Disp: 2 each, Rfl: 0 .  Incontinence Supply Disposable (DEPEND SILHOUETTE BRIEFS L/XL) MISC, 1 each by Does not apply route 2 (two) times daily as needed., Disp: 60 each, Rfl: 5 .  lubiprostone (AMITIZA) 24 MCG capsule, TAKE 1 CAPSULE BY MOUTH 2 TIMES DAILY WITH A MEAL, Disp: 180 capsule, Rfl: 1 .  Nutritional Supplements (ENSURE NUTRITION SHAKE) LIQD, Take 1 each by mouth 2 (two) times daily., Disp: 237 mL, Rfl: 30 .  Polyethyl Glycol-Propyl Glycol (SYSTANE ULTRA) 0.4-0.3 % SOLN, Apply 1 drop to eye 2 (two) times daily. Both eyes, Disp: 30 mL, Rfl: 5 .  sertraline (ZOLOFT) 100 MG tablet, TAKE 1 TABLET BY MOUTH ONCE DAILY, Disp: 90 tablet, Rfl: 1 .  sulfamethoxazole-trimethoprim (BACTRIM DS) 800-160 MG tablet, Take by mouth., Disp: , Rfl:   No Known Allergies  I personally reviewed active problem list, medication list, allergies, family history, social history, health maintenance with the patient/caregiver today.   ROS  He said he is alright.   Objective  Vitals:   01/20/20 1530  BP: (!) 72/54  Pulse: (!) 53  Resp: 12  Temp: 98.1 F (36.7 C)  TempSrc: Oral  SpO2: 93%  Height: 5\' 10"  (1.778 m)    Body mass index is 25.83 kg/m.  Physical Exam  Constitutional: Patient appears well-developed and malnourishes, very frail ,  sitting on his wheelchair,   No distress.  HEENT: head atraumatic, normocephalic, pupils equal and reactive to light,  neck supple Cardiovascular: Normal rate, regular rhythm and normal heart sounds.  No murmur heard. No BLE edema. Pulmonary/Chest: Effort normal and breath sounds normal. No respiratory distress. Abdominal: Soft.  There is no tenderness.Negative CVA tenderess  Psychiatric: Patient has a normal mood and affect. behavior is normal. Judgment and thought content normal. Muscular skeletal: hand atrophy, increase in tonus of legs  PHQ2/9: Depression screen [facility] 2/9 01/20/2020 08/07/2019 07/31/2019 02/27/2019 10/03/2018  Decreased Interest 0 0 0 0 0  Down, Depressed, Hopeless 0 0 0 0 0  PHQ - 2 Score 0 0 0 0 0  Altered sleeping - 0 - 0 0  Tired, decreased energy - 0 - 0 0  Change in appetite - 0 - 0 0  Feeling bad or failure  about yourself  - 0 - 0 0  Trouble concentrating - 0 - 0 0  Moving slowly or fidgety/restless - 0 - 0 0  Suicidal thoughts - 0 - 0 0  PHQ-9 Score - 0 - 0 0  Difficult doing work/chores - Not difficult at all - - -  Some recent data might be hidden    phq 9 is unable    Fall Risk: Fall Risk  01/20/2020 08/29/2019 08/07/2019 07/31/2019 06/02/2019  Falls in the past year? 0 0 1 1 1   Number falls in past yr: 0 0 1 1 0  Injury with Fall? 0 0 1 1 0  Risk Factor Category  - - - - -  Risk for fall due to : Impaired mobility;Mental status change - Impaired balance/gait;Impaired mobility;History of fall(s) Impaired mobility;Impaired balance/gait -  Follow up - - - Falls prevention discussed -     Functional Status Survey: Is the patient deaf or have difficulty hearing?: No Does the patient have difficulty seeing, even when wearing glasses/contacts?: No Does the patient have difficulty concentrating, remembering, or making decisions?: Yes Does the patient have difficulty walking or climbing stairs?: Yes Does the patient have difficulty dressing or bathing?: Yes Does  the patient have difficulty doing errands alone such as visiting a doctor's office or shopping?: Yes    Assessment & Plan  1. Recurrent UTI  - POCT Urinalysis Dipstick - CULTURE, URINE COMPREHENSIVE  2. Flu vaccine need  - Flu vaccine HIGH DOSE PF (Fluzone High dose)  3. Need for Tdap vaccination  - Tdap vaccine greater than or equal to 7yo IM  4. Failure to thrive syndrome, adult  Discussed referral to geriatrician, but we will try changing diet at home first  5. Moderate protein malnutrition ([place])  Sending a list of ways to increase calorie intake

## 2020-01-22 DIAGNOSIS — N390 Urinary tract infection, site not specified: Secondary | ICD-10-CM | POA: Diagnosis not present

## 2020-01-24 LAB — CULTURE, URINE COMPREHENSIVE
MICRO NUMBER:: 11020509
SPECIMEN QUALITY:: ADEQUATE

## 2020-01-27 ENCOUNTER — Other Ambulatory Visit: Payer: Self-pay | Admitting: Family Medicine

## 2020-01-27 MED ORDER — NITROFURANTOIN MONOHYD MACRO 100 MG PO CAPS: 100.0000 mg | ORAL_CAPSULE | Freq: Two times a day (BID) | ORAL | 0 refills | Status: DC

## 2020-01-29 ENCOUNTER — Other Ambulatory Visit: Payer: Self-pay | Admitting: Family Medicine

## 2020-01-29 DIAGNOSIS — G8929 Other chronic pain: Secondary | ICD-10-CM

## 2020-02-06 DIAGNOSIS — H5213 Myopia, bilateral: Secondary | ICD-10-CM | POA: Diagnosis not present

## 2020-02-09 DIAGNOSIS — G809 Cerebral palsy, unspecified: Secondary | ICD-10-CM | POA: Diagnosis not present

## 2020-02-09 DIAGNOSIS — L89300 Pressure ulcer of unspecified buttock, unstageable: Secondary | ICD-10-CM | POA: Diagnosis not present

## 2020-02-16 ENCOUNTER — Other Ambulatory Visit: Payer: Self-pay | Admitting: Family Medicine

## 2020-02-16 DIAGNOSIS — E441 Mild protein-calorie malnutrition: Secondary | ICD-10-CM

## 2020-02-16 DIAGNOSIS — F419 Anxiety disorder, unspecified: Secondary | ICD-10-CM

## 2020-02-16 NOTE — Telephone Encounter (Signed)
Requested Prescriptions  Pending Prescriptions Disp Refills  . busPIRone (BUSPAR) 5 MG tablet [Pharmacy Med Name: BUSPIRONE HCL 5 MG TAB] 180 tablet 0    Sig: TAKE 1 TABLET BY MOUTH TWICE DAILY     Psychiatry: Anxiolytics/Hypnotics - Non-controlled Passed - 02/16/2020  3:08 PM      Passed - Valid encounter within last 6 months    Recent Outpatient Visits          3 weeks ago Recurrent UTI   [facility] Steele Sizer, MD   5 months ago Gum symptoms   [facility], Drue Stager, MD   6 months ago Moderate protein malnutrition [facility])   [facility] Steele Sizer, MD   8 months ago Gait instability   [facility] Steele Sizer, MD   11 months ago Hospital discharge follow-up   [facility] Steele Sizer, MD      Future Appointments            In 2 weeks Steele Sizer, MD [facility], [place]   In 5 months  [facility] [place]           . Calcium Carb-Cholecalciferol (CALCIUM-VITAMIN D) 500-400 MG-UNIT TABS [Pharmacy Med Name: CALCIUM-VITAMIN D 500-400 MG-UNIT T] 180 tablet 0    Sig: TAKE 1 TABLET BY MOUTH 2 TIMES A DAY     Endocrinology: Minerals - Calcium Supplementation with Vitamin D Passed - 02/16/2020  3:08 PM      Passed - Phosphate in normal range and within 360 days    Phosphorus  Date Value Ref Range Status  03/11/2019 2.5 2.5 - 4.6 mg/dL Final    Comment:    Performed at [facility] [place] [address], [place], [place] [address]         Passed - Ca in normal range and within 360 days    Calcium  Date Value Ref Range Status  08/29/2019 9.1 8.6 - 10.3 mg/dL Final   Calcium, Total  Date Value Ref Range Status  08/14/2013 8.4 (L) 8.5 - 10.1 mg/dL Final         Passed - Vitamin D in normal range and within 360 days    Vit D, 25-Hydroxy  Date Value Ref Range Status  08/29/2019 38 30 - 100 ng/mL Final     Comment:    Vitamin D Status         25-OH Vitamin D: . Deficiency:                    <20 ng/mL Insufficiency:             20 - 29 ng/mL Optimal:                 > or = 30 ng/mL . For 25-OH Vitamin D testing on patients on  D2-supplementation and patients for whom quantitation  of D2 and D3 fractions is required, the Quest[employer]ureD(TM) 25-OH VIT D, (D2,D3), LC/MS/MS is recommended: order  code [id number] (patients >45yrs). See Note 1 . Note 1 . For additional information, please refer to  http://education.Quest[employer]ostics.com/faq/FAQ199  (This link is being provided for informational/ educational purposes only.)          Passed - Valid encounter within last 12 months    Recent Outpatient Visits          3 weeks ago Recurrent UTI   [facility] [place], [place],  MD   5 months ago Gum symptoms   [facility] [place], Drue Stager, MD   6 months ago Moderate protein malnutrition [facility])   [facility] Steele Sizer, MD   8 months ago Gait instability   [facility] Steele Sizer, MD   11 months ago Hospital discharge follow-up   [facility] Steele Sizer, MD      Future Appointments            In 2 weeks Steele Sizer, MD [facility], [place]   In 5 months  [facility]

## 2020-02-22 DIAGNOSIS — H5203 Hypermetropia, bilateral: Secondary | ICD-10-CM | POA: Diagnosis not present

## 2020-02-22 DIAGNOSIS — H52223 Regular astigmatism, bilateral: Secondary | ICD-10-CM | POA: Diagnosis not present

## 2020-02-27 NOTE — Progress Notes (Signed)
Name: [name]   MRN: [id number]    DOB: [birth year]   Date:03/01/2020       Progress Note  Subjective  Chief Complaint  Follow up   HPI   Guardian is his nephew: Nicholson Starace # [phone] and [name] his niece  She came in today with [name] from [employer].  He is at an [facility] facility since [facility]/28/2019   He has MR, poor historian.He has CP, he seems stable since earlier this year, wheelchair bound  Lower leg edema/PA: since he is at the new facility since 04/28/2019, he has his pumps to wear at night again, also wearing compression stocking hoses and is doing well at this time  Atherosclerosis aorta, and coronary arteries with also calcified aortic valve, he does not seem to have chest pain, buthe is on medical management, taking aspirin and atorvastatin, we will recheck labs today   Depression: he is taking Zoloft daily and sees Dr. Tamera Punt , we added Buspar for agitation but family requested to stop it , he is due for a refill of medication  History of kidney stones: seeing Dr. Bernardo Heater and had a cystoscopy, right ureteroscopy and stone removaland stent placement, he has to wear depends, no urine odor   History of prostate cancer: had brachytherapy in 2010 , was seeing Dr. Algernon Huxley   Constipation chronic: doing well, no straining, we will continue Amitiza he is off miralax and not having any problems with bowel movements, no blood or straining   Leucopenia and thrombocytopenia: saw Dr. Janese Banks and last levels improved , he has been released from her care  , we will recheck it today   Malnutrition: lost a lot of weight over the past year , back in January 2020 his weight was 189 lbs, during hospital stay back in Nov weight was 150 lbs. His weight today is 132 lbs, when subtracting wheelchair ( 81 lbs), it was around 135 lbs during urgent care visit this Summer, he has been eating well at the group home and taking Premier protein in the afternoon  adding protein to meals.   Patient Active Problem List   Diagnosis Date Noted  . Personal history of urinary calculi 06/21/2019  . PAD (peripheral artery disease) ([place]) 07/04/2017  . Aortic valve calcification 07/04/2017  . Coronary artery calcification 07/04/2017  . Late effect acute polio 05/17/2016  . Chronic constipation 11/19/2015  . Mild major depression ([place]) 11/19/2015  . Bacteriuria, chronic 09/11/2015  . History of prostate cancer 03/02/2015  . Amyotrophia 03/02/2015  . Edema leg 03/02/2015  . Cerebral palsy ([place]) 03/02/2015  . Dyslipidemia 03/02/2015  . Dermatitis, eczematoid 03/02/2015  . 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 H/O acute poliomyelitis 03/02/2015  . Lymphedema 03/02/2015  . Intellectual disability 03/02/2015  . Hypertrophy of nail 03/02/2015  . Chronic venous insufficiency 03/02/2015  . Avitaminosis D 03/02/2015  . Adynamia 03/02/2015  . Dependent on wheelchair 03/02/2015  . Renal cysts, acquired, bilateral 03/02/2015  . At risk for falling 03/02/2015  . Cerebral vascular disease 03/02/2015  . Strabismus 02/11/2015  . Urge incontinence of urine 02/11/2015  . Wheelchair dependence 02/11/2015  . Urinary frequency 10/30/2014    Past Surgical History:  Procedure Laterality Date  . CYSTOSCOPY/URETEROSCOPY/HOLMIUM LASER/STENT PLACEMENT Right 08/14/2017   Procedure: CYSTOSCOPY/URETEROSCOPY/HOLMIUM LASER/STENT PLACEMENT;  Surgeon: Abbie Sons, MD;  Location: [facility] ORS;  Service: Urology;  Laterality: Right;  . leg circulation surgery Right   . PROSTATE SURGERY  11/10/2008   BRACHYTHERAPY  Family History  Problem Relation Age of Onset  . Heart attack Brother   . Heart disease Other     Social History   Tobacco Use  . Smoking status: Never Smoker  . Smokeless tobacco: Never Used  Substance Use Topics  . Alcohol use: No    Alcohol/week: 0.0 standard drinks     Current Outpatient Medications:  .  acetaminophen (TYLENOL) 325 MG tablet, TAKE 2  TABLETS (650 MG TOTAL) BY MOUTH EVERY 6 HOURS AS NEEDED FOR MILD PAIN OR HEADACHE., Disp: 60 tablet, Rfl: 5 .  alfuzosin (UROXATRAL) 10 MG 24 hr tablet, TAKE 1 TABLET BY MOUTH ONCE DAILY, Disp: 90 tablet, Rfl: 1 .  ALPRAZolam (XANAX) 0.25 MG tablet, TAKE ONE TABLET BY MOUTH 2 TIMES A DAY AS NEEDED FOR ANXIETY (WHEN BLOOD PRESSURE IS VERY HIGH OR ANXIOUS), Disp: 20 tablet, Rfl: 0 .  ARTIFICIAL TEARS 0.2-0.2-1 % SOLN, PLACE 2 DROPS INTO EYE 2 TIMES A DAY AS NEEDED, Disp: 15 mL, Rfl: 1 .  ascorbic acid (VITAMIN C) 500 MG tablet, TAKE ONE TABLET BY MOUTH ONCE DAILY, Disp: 30 tablet, Rfl: 4 .  aspirin (ASPIR-LOW) 81 MG EC tablet, TAKE 1 TABLET BY MOUTH ONCE DAILY, Disp: 90 tablet, Rfl: 1 .  atorvastatin (LIPITOR) 40 MG tablet, TAKE 1 TABLET BY MOUTH EVERY EVENING, Disp: 90 tablet, Rfl: 1 .  augmented betamethasone dipropionate (DIPROLENE-AF) 0.05 % cream, APPLY TOPICALLY AS DIRECTED TO RASH AS NEEDED, Disp: 45 g, Rfl: 0 .  Calcium Carb-Cholecalciferol (CALCIUM-VITAMIN D) 500-400 MG-UNIT TABS, TAKE 1 TABLET BY MOUTH 2 TIMES A DAY, Disp: 180 tablet, Rfl: 0 .  calcium carbonate (OS-CAL) 1250 (500 Ca) MG chewable tablet, Chew by mouth., Disp: , Rfl:  .  Cranberry Juice Powder 425 MG CAPS, TAKE 1 CAPSULE BY MOUTH ONCE DAILY, Disp: 90 capsule, Rfl: 1 .  Elastic Bandages & Supports (MEDICAL COMPRESSION STOCKINGS) MISC, 1 Units by Does not apply route daily., Disp: 2 each, Rfl: 0 .  Incontinence Supply Disposable (DEPEND SILHOUETTE BRIEFS L/XL) MISC, 1 each by Does not apply route 2 (two) times daily as needed., Disp: 60 each, Rfl: 5 .  lubiprostone (AMITIZA) 24 MCG capsule, TAKE 1 CAPSULE BY MOUTH 2 TIMES DAILY WITH A MEAL, Disp: 180 capsule, Rfl: 1 .  Nutritional Supplements (ENSURE NUTRITION SHAKE) LIQD, Take 1 each by mouth 2 (two) times daily., Disp: 237 mL, Rfl: 30 .  Polyethyl Glycol-Propyl Glycol (SYSTANE ULTRA) 0.4-0.3 % SOLN, Apply 1 drop to eye 2 (two) times daily. Both eyes, Disp: 30 mL, Rfl: 5 .   sertraline (ZOLOFT) 100 MG tablet, TAKE 1 TABLET BY MOUTH ONCE DAILY, Disp: 90 tablet, Rfl: 1  No Known Allergies  I personally reviewed active problem list, medication list, allergies, family history, social history, health maintenance, notes from last encounter with the patient/caregiver today.   ROS  Constitutional: Negative for fever or weight change.  Respiratory: Negative for cough and shortness of breath.   Cardiovascular: Negative for chest pain or palpitations.  Gastrointestinal: Negative for abdominal pain, no bowel changes.  Musculoskeletal: Positive  for gait problem or joint swelling.  Skin: Negative for rash.  Neurological: Negative for dizziness or headache.  No other specific complaints in a complete review of systems (except as listed in HPI above).   Objective  Vitals:   03/01/20 1014  BP: (!) 100/50  Pulse: 72  Resp: 15  Temp: 98.1 F (36.7 C)  TempSrc: Oral  SpO2: 95%  Weight: 132 lb 6.4 oz (  60.1 kg)  Height: 5\' 10"  (1.778 m)    Body mass index is 19 kg/m.  Physical Exam  Constitutional: Patient appears very thin, sitting on wheelchair,  No distress.  HEENT: head atraumatic, normocephalic, pupils equal and reactive to light, neck supple Cardiovascular: Normal rate, regular rhythm and normal heart sounds.  No murmur heard. No BLE edema. Pulmonary/Chest: Effort normal and breath sounds normal. No respiratory distress. Abdominal: Soft.  There is no tenderness. Psychiatric: Patient has a normal mood and affect. behavior is normal. Judgment and thought content normal.  Recent Results (from the past 2160 hour(s))  CULTURE, URINE COMPREHENSIVE     Status: Abnormal   Collection Time: 01/22/20  4:16 PM   Specimen: Urine  Result Value Ref Range   MICRO NUMBER: [id number]    SPECIMEN QUALITY: Adequate    Source OTHER (SPECIFY)    STATUS: FINAL    ISOLATE 1: Enterococcus faecalis (A)     Comment: 10,000-49,000 CFU/mL of Enterococcus faecalis       Susceptibility   Enterococcus faecalis - CULT, URN, SPECIAL POSITIVE 1    AMPICILLIN <=2 Sensitive     VANCOMYCIN 1 Sensitive     NITROFURANTOIN* <=16 Sensitive      * Legend:S = Susceptible  I = IntermediateR = Resistant  NS = Not susceptible* = Not tested  NR = Not reported**NN = See antimicrobic comments      PHQ2/9: Depression screen [facility] 2/9 03/01/2020 01/20/2020 08/07/2019 07/31/2019 02/27/2019  Decreased Interest 0 0 0 0 0  Down, Depressed, Hopeless 2 0 0 0 0  PHQ - 2 Score 2 0 0 0 0  Altered sleeping 0 - 0 - 0  Tired, decreased energy 0 - 0 - 0  Change in appetite 0 - 0 - 0  Feeling bad or failure about yourself  0 - 0 - 0  Trouble concentrating 0 - 0 - 0  Moving slowly or fidgety/restless 0 - 0 - 0  Suicidal thoughts 0 - 0 - 0  PHQ-9 Score 2 - 0 - 0  Difficult doing work/chores Somewhat difficult - Not difficult at all - -  Some recent data might be hidden    phq 9 negative    Fall Risk: Fall Risk  03/01/2020 01/20/2020 08/29/2019 08/07/2019 07/31/2019  Falls in the past year? 0 0 0 1 1  Number falls in past yr: 0 0 0 1 1  Injury with Fall? 0 0 0 1 1  Risk Factor Category  - - - - -  Risk for fall due to : - Impaired mobility;Mental status change - Impaired balance/gait;Impaired mobility;History of fall(s) Impaired mobility;Impaired balance/gait  Follow up - - - - Falls prevention discussed     Functional Status Survey: Is the patient deaf or have difficulty hearing?: No Does the patient have difficulty concentrating, remembering, or making decisions?: No Does the patient have difficulty walking or climbing stairs?: Yes Does the patient have difficulty dressing or bathing?: Yes Does the patient have difficulty doing errands alone such as visiting a doctor's office or shopping?: Yes    Assessment & Plan  1. Atherosclerosis of aorta (HCC)  - atorvastatin (LIPITOR) 40 MG tablet; Take 1 tablet (40 mg total) by mouth every evening.  Dispense: 90 tablet; Refill: 1 - Lipid  panel  2. Urinary frequency  - alfuzosin (UROXATRAL) 10 MG 24 hr tablet; Take 1 tablet (10 mg total) by mouth daily.  Dispense: 90 tablet; Refill: 1  3. Mild major depression ([place])  -  sertraline (ZOLOFT) 100 MG tablet; Take 1 tablet (100 mg total) by mouth daily.  Dispense: 90 tablet; Refill: 1  Discontinue Buspar   4. Chronic constipation  - lubiprostone (AMITIZA) 24 MCG capsule; TAKE 1 CAPSULE BY MOUTH 2 TIMES DAILY WITH A MEAL  Dispense: 180 capsule; Refill: 1  5. B12 deficiency  - CBC with Differential/Platelet - cyanocobalamin ((VITAMIN B-12)) injection 1,000 mcg  6. Vitamin D deficiency   7. Moderate protein malnutrition (HCC)  - COMPLETE METABOLIC PANEL WITH GFR - CBC with Differential/Platelet  8. Need for immunization against influenza  - Flu Vaccine QUAD High Dose(Fluad)

## 2020-03-01 ENCOUNTER — Other Ambulatory Visit: Payer: Self-pay

## 2020-03-01 ENCOUNTER — Encounter: Payer: Self-pay | Admitting: Family Medicine

## 2020-03-01 ENCOUNTER — Ambulatory Visit (INDEPENDENT_AMBULATORY_CARE_PROVIDER_SITE_OTHER): Payer: Medicare Other | Admitting: Family Medicine

## 2020-03-01 VITALS — BP 100/50 | HR 72 | Temp 98.1°F | Resp 15 | Ht 70.0 in | Wt 132.4 lb

## 2020-03-01 DIAGNOSIS — I700 Atherosclerosis of aorta: Secondary | ICD-10-CM | POA: Diagnosis not present

## 2020-03-01 DIAGNOSIS — E538 Deficiency of other specified B group vitamins: Secondary | ICD-10-CM

## 2020-03-01 DIAGNOSIS — K5909 Other constipation: Secondary | ICD-10-CM | POA: Diagnosis not present

## 2020-03-01 DIAGNOSIS — F320 Major depressive disorder, single episode, mild: Secondary | ICD-10-CM

## 2020-03-01 DIAGNOSIS — Z23 Encounter for immunization: Secondary | ICD-10-CM | POA: Diagnosis not present

## 2020-03-01 DIAGNOSIS — E440 Moderate protein-calorie malnutrition: Secondary | ICD-10-CM

## 2020-03-01 DIAGNOSIS — R350 Frequency of micturition: Secondary | ICD-10-CM

## 2020-03-01 DIAGNOSIS — E559 Vitamin D deficiency, unspecified: Secondary | ICD-10-CM

## 2020-03-01 LAB — COMPLETE METABOLIC PANEL WITH GFR
AG Ratio: 1.4 (calc) (ref 1.0–2.5)
ALT: 15 U/L (ref 9–46)
AST: 13 U/L (ref 10–35)
Albumin: 3.2 g/dL — ABNORMAL LOW (ref 3.6–5.1)
Alkaline phosphatase (APISO): 95 U/L (ref 35–144)
BUN/Creatinine Ratio: 60 (calc) — ABNORMAL HIGH (ref 6–22)
BUN: 34 mg/dL — ABNORMAL HIGH (ref 7–25)
CO2: 28 mmol/L (ref 20–32)
Calcium: 8.5 mg/dL — ABNORMAL LOW (ref 8.6–10.3)
Chloride: 107 mmol/L (ref 98–110)
Creat: 0.57 mg/dL — ABNORMAL LOW (ref 0.70–1.18)
GFR, Est African American: 115 mL/min/{1.73_m2} (ref >=60)
GFR, Est Non African American: 99 mL/min/{1.73_m2} (ref >=60)
Globulin: 2.3 g/dL (calc) (ref 1.9–3.7)
Glucose, Bld: 99 mg/dL (ref 65–99)
Potassium: 3.6 mmol/L (ref 3.5–5.3)
Sodium: 143 mmol/L (ref 135–146)
Total Bilirubin: 0.4 mg/dL (ref 0.2–1.2)
Total Protein: 5.5 g/dL — ABNORMAL LOW (ref 6.1–8.1)

## 2020-03-01 LAB — CBC WITH DIFFERENTIAL/PLATELET
Absolute Monocytes: 543 cells/uL (ref 200–950)
Basophils Absolute: 22 cells/uL (ref 0–200)
Basophils Relative: 0.4 %
Eosinophils Absolute: 22 cells/uL (ref 15–500)
Eosinophils Relative: 0.4 %
HCT: 29.9 % — ABNORMAL LOW (ref 38.5–50.0)
Hemoglobin: 10.2 g/dL — ABNORMAL LOW (ref 13.2–17.1)
Lymphs Abs: 818 cells/uL — ABNORMAL LOW (ref 850–3900)
MCH: 33.6 pg — ABNORMAL HIGH (ref 27.0–33.0)
MCHC: 34.1 g/dL (ref 32.0–36.0)
MCV: 98.4 fL (ref 80.0–100.0)
MPV: 9.7 fL (ref 7.5–12.5)
Monocytes Relative: 9.7 %
Neutro Abs: 4194 cells/uL (ref 1500–7800)
Neutrophils Relative %: 74.9 %
Platelets: 170 10*3/uL (ref 140–400)
RBC: 3.04 10*6/uL — ABNORMAL LOW (ref 4.20–5.80)
RDW: 11.6 % (ref 11.0–15.0)
Total Lymphocyte: 14.6 %
WBC: 5.6 10*3/uL (ref 3.8–10.8)

## 2020-03-01 LAB — LIPID PANEL
Cholesterol: 78 mg/dL (ref <200)
HDL: 40 mg/dL (ref >=40)
LDL Cholesterol (Calc): 27 mg/dL (calc)
Non-HDL Cholesterol (Calc): 38 mg/dL (calc) (ref <130)
Total CHOL/HDL Ratio: 2 (calc) (ref <5.0)
Triglycerides: 37 mg/dL (ref <150)

## 2020-03-01 MED ORDER — ALFUZOSIN HCL ER 10 MG PO TB24: 10.0000 mg | ORAL_TABLET | Freq: Every day | ORAL | 1 refills | Status: DC

## 2020-03-01 MED ORDER — ATORVASTATIN CALCIUM 40 MG PO TABS: 40.0000 mg | ORAL_TABLET | Freq: Every evening | ORAL | 1 refills | Status: DC

## 2020-03-01 MED ORDER — SERTRALINE HCL 100 MG PO TABS: 100.0000 mg | ORAL_TABLET | Freq: Every day | ORAL | 1 refills | Status: DC

## 2020-03-01 MED ORDER — CYANOCOBALAMIN 1000 MCG/ML IJ SOLN
1000.0000 ug | Freq: Once | INTRAMUSCULAR | Status: AC
  Administered 2020-03-01: 1000 ug via INTRAMUSCULAR

## 2020-03-01 MED ORDER — LUBIPROSTONE 24 MCG PO CAPS: ORAL_CAPSULE | ORAL | 1 refills | Status: DC

## 2020-03-11 DIAGNOSIS — L89300 Pressure ulcer of unspecified buttock, unstageable: Secondary | ICD-10-CM | POA: Diagnosis not present

## 2020-03-11 DIAGNOSIS — G809 Cerebral palsy, unspecified: Secondary | ICD-10-CM | POA: Diagnosis not present

## 2020-03-15 ENCOUNTER — Other Ambulatory Visit: Payer: Self-pay | Admitting: Family Medicine

## 2020-03-15 LAB — FECAL OCCULT BLOOD, GUAIAC: Fecal Occult Blood: NEGATIVE

## 2020-03-15 NOTE — Telephone Encounter (Signed)
Requested Prescriptions  Pending Prescriptions Disp Refills  . ascorbic acid (VITAMIN C) 500 MG tablet [Pharmacy Med Name: ASCORBIC ACID 500 MG TAB] 90 tablet 1    Sig: TAKE ONE TABLET BY MOUTH ONCE DAILY     Endocrinology:  Vitamins Passed - 03/15/2020  3:22 PM      Passed - Valid encounter within last 12 months    Recent Outpatient Visits          2 weeks ago Atherosclerosis of aorta [facility])   [facility] Steele Sizer, MD   1 month ago Recurrent UTI   [facility] Steele Sizer, MD   6 months ago Gum symptoms   [facility] [place], Drue Stager, MD   7 months ago Moderate protein malnutrition [facility])   [facility] Steele Sizer, MD   9 months ago Gait instability   [facility] Steele Sizer, MD      Future Appointments            In 4 months [facility], [facility]

## 2020-04-10 DIAGNOSIS — L89300 Pressure ulcer of unspecified buttock, unstageable: Secondary | ICD-10-CM | POA: Diagnosis not present

## 2020-04-10 DIAGNOSIS — G809 Cerebral palsy, unspecified: Secondary | ICD-10-CM | POA: Diagnosis not present

## 2020-04-19 ENCOUNTER — Other Ambulatory Visit: Payer: Self-pay

## 2020-04-19 ENCOUNTER — Emergency Department: Payer: Medicare Other

## 2020-04-19 ENCOUNTER — Inpatient Hospital Stay
Admission: EM | Admit: 2020-04-19 | Discharge: 2020-04-23 | DRG: 871 | Disposition: A | Discharge status: Home Health | Payer: Medicare Other | Attending: Internal Medicine | Admitting: Internal Medicine

## 2020-04-19 DIAGNOSIS — N400 Enlarged prostate without lower urinary tract symptoms: Secondary | ICD-10-CM | POA: Diagnosis present

## 2020-04-19 DIAGNOSIS — Z993 Dependence on wheelchair: Secondary | ICD-10-CM

## 2020-04-19 DIAGNOSIS — J188 Other pneumonia, unspecified organism: Secondary | ICD-10-CM

## 2020-04-19 DIAGNOSIS — R6889 Other general symptoms and signs: Secondary | ICD-10-CM | POA: Diagnosis not present

## 2020-04-19 DIAGNOSIS — R069 Unspecified abnormalities of breathing: Secondary | ICD-10-CM | POA: Diagnosis not present

## 2020-04-19 DIAGNOSIS — E785 Hyperlipidemia, unspecified: Secondary | ICD-10-CM | POA: Diagnosis not present

## 2020-04-19 DIAGNOSIS — Z8546 Personal history of malignant neoplasm of prostate: Secondary | ICD-10-CM | POA: Diagnosis not present

## 2020-04-19 DIAGNOSIS — L8990 Pressure ulcer of unspecified site, unspecified stage: Secondary | ICD-10-CM | POA: Insufficient documentation

## 2020-04-19 DIAGNOSIS — N390 Urinary tract infection, site not specified: Secondary | ICD-10-CM | POA: Diagnosis present

## 2020-04-19 DIAGNOSIS — E860 Dehydration: Secondary | ICD-10-CM | POA: Diagnosis not present

## 2020-04-19 DIAGNOSIS — E43 Unspecified severe protein-calorie malnutrition: Secondary | ICD-10-CM | POA: Diagnosis not present

## 2020-04-19 DIAGNOSIS — Z743 Need for continuous supervision: Secondary | ICD-10-CM | POA: Diagnosis not present

## 2020-04-19 DIAGNOSIS — J189 Pneumonia, unspecified organism: Secondary | ICD-10-CM | POA: Diagnosis present

## 2020-04-19 DIAGNOSIS — L89152 Pressure ulcer of sacral region, stage 2: Secondary | ICD-10-CM | POA: Diagnosis not present

## 2020-04-19 DIAGNOSIS — Z7401 Bed confinement status: Secondary | ICD-10-CM

## 2020-04-19 DIAGNOSIS — F419 Anxiety disorder, unspecified: Secondary | ICD-10-CM | POA: Diagnosis present

## 2020-04-19 DIAGNOSIS — Z79899 Other long term (current) drug therapy: Secondary | ICD-10-CM | POA: Diagnosis not present

## 2020-04-19 DIAGNOSIS — G800 Spastic quadriplegic cerebral palsy: Secondary | ICD-10-CM | POA: Diagnosis not present

## 2020-04-19 DIAGNOSIS — F32A Depression, unspecified: Secondary | ICD-10-CM | POA: Diagnosis present

## 2020-04-19 DIAGNOSIS — A419 Sepsis, unspecified organism: Principal | ICD-10-CM | POA: Diagnosis present

## 2020-04-19 DIAGNOSIS — R6521 Severe sepsis with septic shock: Secondary | ICD-10-CM | POA: Diagnosis present

## 2020-04-19 DIAGNOSIS — D6959 Other secondary thrombocytopenia: Secondary | ICD-10-CM | POA: Diagnosis present

## 2020-04-19 DIAGNOSIS — R0602 Shortness of breath: Secondary | ICD-10-CM | POA: Diagnosis not present

## 2020-04-19 DIAGNOSIS — Z20822 Contact with and (suspected) exposure to covid-19: Secondary | ICD-10-CM | POA: Diagnosis present

## 2020-04-19 DIAGNOSIS — Z681 Body mass index (BMI) 19 or less, adult: Secondary | ICD-10-CM

## 2020-04-19 DIAGNOSIS — R532 Functional quadriplegia: Secondary | ICD-10-CM | POA: Diagnosis not present

## 2020-04-19 DIAGNOSIS — N179 Acute kidney failure, unspecified: Secondary | ICD-10-CM | POA: Diagnosis present

## 2020-04-19 DIAGNOSIS — G809 Cerebral palsy, unspecified: Secondary | ICD-10-CM | POA: Diagnosis not present

## 2020-04-19 DIAGNOSIS — F79 Unspecified intellectual disabilities: Secondary | ICD-10-CM | POA: Diagnosis not present

## 2020-04-19 DIAGNOSIS — Z7982 Long term (current) use of aspirin: Secondary | ICD-10-CM

## 2020-04-19 DIAGNOSIS — R0689 Other abnormalities of breathing: Secondary | ICD-10-CM | POA: Diagnosis not present

## 2020-04-19 LAB — LACTIC ACID, PLASMA
Lactic Acid, Venous: 3.8 mmol/L (ref 0.5–1.9)
Lactic Acid, Venous: 2.9 mmol/L (ref 0.5–1.9)
Lactic Acid, Venous: 2 mmol/L (ref 0.5–1.9)

## 2020-04-19 LAB — RESP PANEL BY RT-PCR (FLU A&B, COVID) ARPGX2
Influenza A by PCR: NEGATIVE
Influenza B by PCR: NEGATIVE
SARS Coronavirus 2 by RT PCR: NEGATIVE

## 2020-04-19 LAB — PROCALCITONIN: Procalcitonin: >150 ng/mL

## 2020-04-19 LAB — COMPREHENSIVE METABOLIC PANEL
ALT: 65 U/L — ABNORMAL HIGH (ref 0–44)
AST: 83 U/L — ABNORMAL HIGH (ref 15–41)
Albumin: 2.7 g/dL — ABNORMAL LOW (ref 3.5–5.0)
Alkaline Phosphatase: 82 U/L (ref 38–126)
Anion gap: 9 (ref 5–15)
BUN: 59 mg/dL — ABNORMAL HIGH (ref 8–23)
CO2: 22 mmol/L (ref 22–32)
Calcium: 8.5 mg/dL — ABNORMAL LOW (ref 8.9–10.3)
Chloride: 106 mmol/L (ref 98–111)
Creatinine, Ser: 1.58 mg/dL — ABNORMAL HIGH (ref 0.61–1.24)
GFR, Estimated: 45 mL/min — ABNORMAL LOW (ref >60)
Glucose, Bld: 128 mg/dL — ABNORMAL HIGH (ref 70–99)
Potassium: 3.9 mmol/L (ref 3.5–5.1)
Sodium: 137 mmol/L (ref 135–145)
Total Bilirubin: 1.2 mg/dL (ref 0.3–1.2)
Total Protein: 5.8 g/dL — ABNORMAL LOW (ref 6.5–8.1)

## 2020-04-19 LAB — CBC WITH DIFFERENTIAL/PLATELET
Abs Immature Granulocytes: 0.6 10*3/uL — ABNORMAL HIGH (ref 0.00–0.07)
Basophils Absolute: 0 10*3/uL (ref 0.0–0.1)
Basophils Relative: 0 %
Eosinophils Absolute: 0 10*3/uL (ref 0.0–0.5)
Eosinophils Relative: 0 %
HCT: 29 % — ABNORMAL LOW (ref 39.0–52.0)
Hemoglobin: 9.6 g/dL — ABNORMAL LOW (ref 13.0–17.0)
Immature Granulocytes: 7 %
Lymphocytes Relative: 7 %
Lymphs Abs: 0.6 10*3/uL — ABNORMAL LOW (ref 0.7–4.0)
MCH: 32.9 pg (ref 26.0–34.0)
MCHC: 33.1 g/dL (ref 30.0–36.0)
MCV: 99.3 fL (ref 80.0–100.0)
Monocytes Absolute: 0.4 10*3/uL (ref 0.1–1.0)
Monocytes Relative: 4 %
Neutro Abs: 7.5 10*3/uL (ref 1.7–7.7)
Neutrophils Relative %: 82 %
Platelets: 79 10*3/uL — ABNORMAL LOW (ref 150–400)
RBC: 2.92 MIL/uL — ABNORMAL LOW (ref 4.22–5.81)
RDW: 13.5 % (ref 11.5–15.5)
Smear Review: NORMAL
WBC: 9.1 10*3/uL (ref 4.0–10.5)
nRBC: 0 % (ref 0.0–0.2)

## 2020-04-19 LAB — URINALYSIS, ROUTINE W REFLEX MICROSCOPIC
Bilirubin Urine: NEGATIVE
Glucose, UA: NEGATIVE mg/dL
Ketones, ur: NEGATIVE mg/dL
Nitrite: NEGATIVE
Protein, ur: >=300 mg/dL — AB
Specific Gravity, Urine: 1.016 (ref 1.005–1.030)
WBC, UA: >50 WBC/hpf — ABNORMAL HIGH (ref 0–5)
pH: 8 (ref 5.0–8.0)

## 2020-04-19 LAB — LIPASE, BLOOD: Lipase: 17 U/L (ref 11–51)

## 2020-04-19 MED ORDER — BOOST PLUS PO LIQD
237.0000 mL | Freq: Two times a day (BID) | ORAL | Status: DC
  Administered 2020-04-19 – 2020-04-20 (×2): 237 mL via ORAL
  Filled 2020-04-19: qty 237

## 2020-04-19 MED ORDER — ALFUZOSIN HCL ER 10 MG PO TB24
10.0000 mg | ORAL_TABLET | Freq: Every day | ORAL | Status: DC
  Administered 2020-04-20 – 2020-04-23 (×4): 10 mg via ORAL
  Filled 2020-04-19 (×5): qty 1

## 2020-04-19 MED ORDER — ACETAMINOPHEN 650 MG RE SUPP: 650.0000 mg | Freq: Four times a day (QID) | RECTAL | Status: DC | PRN

## 2020-04-19 MED ORDER — ATORVASTATIN CALCIUM 20 MG PO TABS
40.0000 mg | ORAL_TABLET | Freq: Every evening | ORAL | Status: DC
  Administered 2020-04-20 – 2020-04-22 (×3): 40 mg via ORAL
  Filled 2020-04-19 (×3): qty 2

## 2020-04-19 MED ORDER — LUBIPROSTONE 24 MCG PO CAPS
24.0000 ug | ORAL_CAPSULE | Freq: Two times a day (BID) | ORAL | Status: DC
  Administered 2020-04-20 – 2020-04-23 (×6): 24 ug via ORAL
  Filled 2020-04-19 (×11): qty 1

## 2020-04-19 MED ORDER — POLYETHYL GLYCOL-PROPYL GLYCOL 0.4-0.3 % OP SOLN: 1.0000 [drp] | Freq: Two times a day (BID) | OPHTHALMIC | Status: DC

## 2020-04-19 MED ORDER — CHLORHEXIDINE GLUCONATE CLOTH 2 % EX PADS
6.0000 | MEDICATED_PAD | Freq: Every day | CUTANEOUS | Status: DC
Start: 2020-04-20 — End: 2020-04-22
  Administered 2020-04-20 – 2020-04-21 (×2): 6 via TOPICAL
  Filled 2020-04-19: qty 6

## 2020-04-19 MED ORDER — LACTATED RINGERS IV SOLN: INTRAVENOUS | Status: DC

## 2020-04-19 MED ORDER — VANCOMYCIN HCL 750 MG/150ML IV SOLN
750.0000 mg | INTRAVENOUS | Status: DC
  Filled 2020-04-19: qty 150

## 2020-04-19 MED ORDER — SODIUM CHLORIDE 0.9 % IV SOLN
500.0000 mg | INTRAVENOUS | Status: DC
  Administered 2020-04-19: 14:00:00 500 mg via INTRAVENOUS
  Filled 2020-04-19: qty 500

## 2020-04-19 MED ORDER — SODIUM CHLORIDE 0.9 % IV BOLUS (SEPSIS)
1000.0000 mL | Freq: Once | INTRAVENOUS | Status: AC
  Administered 2020-04-19: 13:00:00 1000 mL via INTRAVENOUS

## 2020-04-19 MED ORDER — SODIUM CHLORIDE 0.9 % IV SOLN
2.0000 g | Freq: Two times a day (BID) | INTRAVENOUS | Status: DC
  Administered 2020-04-19 – 2020-04-20 (×2): 2 g via INTRAVENOUS
  Filled 2020-04-19 (×3): qty 2

## 2020-04-19 MED ORDER — SODIUM CHLORIDE 0.9% FLUSH
3.0000 mL | Freq: Two times a day (BID) | INTRAVENOUS | Status: DC
  Administered 2020-04-19 – 2020-04-23 (×5): 3 mL via INTRAVENOUS

## 2020-04-19 MED ORDER — ONDANSETRON HCL 4 MG PO TABS: 4.0000 mg | ORAL_TABLET | Freq: Four times a day (QID) | ORAL | Status: DC | PRN

## 2020-04-19 MED ORDER — SERTRALINE HCL 50 MG PO TABS
100.0000 mg | ORAL_TABLET | Freq: Every day | ORAL | Status: DC
  Administered 2020-04-20 – 2020-04-23 (×4): 100 mg via ORAL
  Filled 2020-04-19 (×4): qty 2

## 2020-04-19 MED ORDER — ACETAMINOPHEN 325 MG PO TABS: 650.0000 mg | ORAL_TABLET | Freq: Four times a day (QID) | ORAL | Status: DC | PRN

## 2020-04-19 MED ORDER — ASPIRIN 81 MG PO CHEW
81.0000 mg | CHEWABLE_TABLET | Freq: Every day | ORAL | Status: DC
Start: 2020-04-19 — End: 2020-04-24
  Administered 2020-04-20 – 2020-04-23 (×4): 81 mg via ORAL
  Filled 2020-04-19 (×4): qty 1

## 2020-04-19 MED ORDER — SODIUM CHLORIDE 0.9 % IV BOLUS
1000.0000 mL | Freq: Once | INTRAVENOUS | Status: AC
  Administered 2020-04-19: 10:00:00 1000 mL via INTRAVENOUS

## 2020-04-19 MED ORDER — SODIUM CHLORIDE 0.9 % IV SOLN
2.0000 g | INTRAVENOUS | Status: DC
  Administered 2020-04-19: 11:00:00 2 g via INTRAVENOUS
  Filled 2020-04-19: qty 20

## 2020-04-19 MED ORDER — SODIUM CHLORIDE 0.9 % IV BOLUS
500.0000 mL | Freq: Once | INTRAVENOUS | Status: AC
  Administered 2020-04-19: 15:00:00 500 mL via INTRAVENOUS

## 2020-04-19 MED ORDER — ENOXAPARIN SODIUM 40 MG/0.4ML ~~LOC~~ SOLN: 40.0000 mg | SUBCUTANEOUS | Status: DC

## 2020-04-19 MED ORDER — ATORVASTATIN CALCIUM 20 MG PO TABS
40.0000 mg | ORAL_TABLET | Freq: Every evening | ORAL | Status: DC
Start: 2020-04-19 — End: 2020-04-19

## 2020-04-19 MED ORDER — SODIUM CHLORIDE 0.9 % IV SOLN
250.0000 mL | INTRAVENOUS | Status: DC
  Administered 2020-04-20 – 2020-04-22 (×3): 250 mL via INTRAVENOUS

## 2020-04-19 MED ORDER — ALPRAZOLAM 0.5 MG PO TABS: 0.2500 mg | ORAL_TABLET | Freq: Two times a day (BID) | ORAL | Status: DC | PRN

## 2020-04-19 MED ORDER — ONDANSETRON HCL 4 MG/2ML IJ SOLN: 4.0000 mg | Freq: Four times a day (QID) | INTRAMUSCULAR | Status: DC | PRN

## 2020-04-19 MED ORDER — POLYVINYL ALCOHOL 1.4 % OP SOLN
2.0000 [drp] | OPHTHALMIC | Status: DC | PRN
  Filled 2020-04-19: qty 15

## 2020-04-19 MED ORDER — NOREPINEPHRINE 4 MG/250ML-% IV SOLN
2.0000 ug/min | INTRAVENOUS | Status: DC
  Administered 2020-04-19: 16:00:00 2 ug/min via INTRAVENOUS
  Administered 2020-04-20: 03:00:00 7 ug/min via INTRAVENOUS
  Filled 2020-04-19 (×2): qty 250

## 2020-04-19 MED ORDER — VANCOMYCIN HCL 1250 MG/250ML IV SOLN
1250.0000 mg | Freq: Once | INTRAVENOUS | Status: AC
  Administered 2020-04-19: 19:00:00 1250 mg via INTRAVENOUS
  Filled 2020-04-19: qty 250

## 2020-04-19 MED ORDER — HEPARIN SODIUM (PORCINE) 5000 UNIT/ML IJ SOLN
5000.0000 [IU] | Freq: Three times a day (TID) | INTRAMUSCULAR | Status: DC
  Administered 2020-04-19 – 2020-04-23 (×11): 5000 [IU] via SUBCUTANEOUS
  Filled 2020-04-19 (×11): qty 1

## 2020-04-19 MED ORDER — GLYCERIN-HYPROMELLOSE-PEG 400 0.2-0.2-1 % OP SOLN: 2.0000 [drp] | Freq: Two times a day (BID) | OPHTHALMIC | Status: DC | PRN

## 2020-04-19 NOTE — ED Triage Notes (Signed)
Pt here via [facility] from [employer] [facility] [facility] home.   EMS called out d/t AMS and SHOB. Pt in no distress on EMS arrival. Per facility, pt had fever yesterday, last dose of tylenol prior to arrival at 7am. Ems vs bp 68/49.    Pt denies any complaints.

## 2020-04-19 NOTE — ED Notes (Signed)
Patient was rolled to left side and pillow placed under right buttocks for comfort. Folded sheet placed between knees for comfort.

## 2020-04-19 NOTE — ED Notes (Signed)
Report given to Megan RN

## 2020-04-19 NOTE — ED Notes (Signed)
Patient's nephew is at bedside.

## 2020-04-19 NOTE — Progress Notes (Signed)
CODE SEPSIS - PHARMACY COMMUNICATION  **Broad Spectrum Antibiotics should be administered within 1 hour of Sepsis diagnosis**  Time Code Sepsis Called/Page Received: @ 1358  Antibiotics Ordered: Azithromycin and Ceftriaxone   Time of 1st antibiotic administration: @ 1058   Gardner Candle, PharmD, BCPS Clinical Pharmacist 04/19/2020 2:01 PM

## 2020-04-19 NOTE — H&P (Signed)
NAME:  [name], MRN:  [id number], DOB:  1940-06-11, LOS: 0 ADMISSION DATE:  04/19/2020, CONSULTATION DATE:  04/19/20 REFERRING MD:  ED physician, CHIEF COMPLAINT:  AMS and SOB  Brief History:  This is a 79 yo male with history of CP, intellectual disability, prostate cancer, HLD, and BPH who presented with AMS and SOB.    History of Present Illness:  This is a 79 yo male with history of CP, intellectual disability, prostate cancer, HLD, and BPH who presented with AMS and SOB.  Patient states that he became short of breath today.  However on speaking with niece who is the patient's guardian it seems that she was called last night that patient was hypotensive.  They were going to try to use fluids at his living facility to increase the blood pressure.  This is in the context of poor p.o. intake.  However patient was noted to have a fever this morning at his facility.  Niece is unaware as to the actual recording of the temperature.  Outside of this patient has been doing fairly well per the niece.  She notes that patient is able to answer simple questions but is not able to carry on complex conversations at baseline.  Patient appears near his baseline per niece.  Patient does endorse some abdominal tenderness in the suprapubic region.  Past Medical History:  CP BPH HLD Prostate cancer AMS  Significant Hospital Events:  Admitted to ICU on 04/19/20  Consults:  NA  Procedures:  NA  Significant Diagnostic Tests:  Chest x-ray with some increasing opacifications in the basilar regions.  But no overt dense consolidation appreciated.  It is difficult to really delineate the left-sided heart border  Micro Data:  Cultures pending  Antimicrobials:  Cefepime and Vanco for now  Interim History / Subjective:  NA  Objective   Blood pressure 109/67, pulse (!) 104, temperature 98.1 F (36.7 C), temperature source Oral, resp. rate (!) 24, height 5\' 10"  (1.778 m), weight 60 kg, SpO2 95  %.        Intake/Output Summary (Last 24 hours) at 04/19/2020 1810 Last data filed at 04/19/2020 1444 Gross per 24 hour  Intake 2350 ml  Output --  Net 2350 ml   Filed Weights   04/19/20 1006  Weight: 60 kg    Examination: General: Patient interactive and alert HENT: Moist mucous membranes Lungs: Intermittent rhonchi in bilateral lower lobes no increased work of breathing Cardiovascular: Regular rate and rhythm Abdomen: Soft nondistended.  Some tenderness in the suprapubic region Extremities: No edema appreciated Neuro: No focal deficits noted patient noted to have baseline contractures.  At baseline patient is bedridden GU: Deferred  Resolved Hospital Problem list   Not applicable  Assessment & Plan:  This is a 79 yo with history as noted above who presents for chief complaint of AMS and SOB.   Shock-likely secondary to UTI versus pneumonia.  Given history of recurrent UTIs would favor UTI.  Last urine culture grew Enterococcus faecalis that was sensitive to ampicillin nitrofurantoin and vancomycin -Broad-spectrum antibiotics with cefepime and vancomycin -Levophed as needed -Patient received 30 cc/kg in the ED -Can consider repeat bolus as patient with AKI on labs  Acute kidney injury-Baseline creatinine around 0.57 creatinine today at 1.58 -Avoid nephrotoxins -Fluid as needed as patient with history of decreased p.o. intake and likely a prerenal state from inadequate perfusion  Depression -Sertraline   Anxiety -Xanax prn    Best practice (evaluated daily)  Diet:  Pureed  Pain/Anxiety/Delirium protocol (if indicated): NA VAP protocol (if indicated): NA DVT prophylaxis: lovenox for now GI prophylaxis: NA Glucose control: Not indicated for now Mobility: Bed bound Disposition:ICU  Goals of Care:   Code Status: Full code niece at bedside who is his guardian. She confirmed patient was full code  Labs   CBC: Recent Labs  Lab 04/19/20 1009  WBC 9.1   NEUTROABS 7.5  HGB 9.6*  HCT 29.0*  MCV 99.3  PLT 79*    Basic Metabolic Panel: Recent Labs  Lab 04/19/20 1009  NA 137  K 3.9  CL 106  CO2 22  GLUCOSE 128*  BUN 59*  CREATININE 1.58*  CALCIUM 8.5*   GFR: Estimated Creatinine Clearance: 33.2 mL/min (A) (by C-G formula based on SCr of 1.58 mg/dL (H)). Recent Labs  Lab 04/19/20 1009 04/19/20 1231 04/19/20 1447  WBC 9.1  --   --   LATICACIDVEN  --  3.8* 2.9*    Liver Function Tests: Recent Labs  Lab 04/19/20 1009  AST 83*  ALT 65*  ALKPHOS 82  BILITOT 1.2  PROT 5.8*  ALBUMIN 2.7*   Recent Labs  Lab 04/19/20 1009  LIPASE 17   No results for input(s): AMMONIA in the last 168 hours.  ABG No results found for: PHART, PCO2ART, PO2ART, HCO3, TCO2, ACIDBASEDEF, O2SAT   Coagulation Profile: No results for input(s): INR, PROTIME in the last 168 hours.  Cardiac Enzymes: No results for input(s): CKTOTAL, CKMB, CKMBINDEX, TROPONINI in the last 168 hours.  HbA1C: Hgb A1c MFr Bld  Date/Time Value Ref Range Status  11/30/2017 10:40 AM 4.9 <5.7 % of total Hgb Final    Comment:    For the purpose of screening for the presence of diabetes: . <5.7%       Consistent with the absence of diabetes 5.7-6.4%    Consistent with increased risk for diabetes             (prediabetes) > or =6.5%  Consistent with diabetes . This assay result is consistent with a decreased risk of diabetes. . Currently, no consensus exists regarding use of hemoglobin A1c for diagnosis of diabetes in children. . According to American Diabetes Association (ADA) guidelines, hemoglobin A1c <7.0% represents optimal control in non-pregnant diabetic patients. Different metrics may apply to specific patient populations.  Standards of Medical Care in Diabetes(ADA). Marland Kitchen   10/02/2016 09:52 AM 4.7 <5.7 % Final    Comment:      For the purpose of screening for the presence of diabetes:   <5.7%       Consistent with the absence of  diabetes 5.7-6.4 %   Consistent with increased risk for diabetes (prediabetes) >=6.5 %     Consistent with diabetes   This assay result is consistent with a decreased risk of diabetes.   Currently, no consensus exists regarding use of hemoglobin A1c for diagnosis of diabetes in children.   According to American Diabetes Association (ADA) guidelines, hemoglobin A1c <7.0% represents optimal control in non-pregnant diabetic patients. Different metrics may apply to specific patient populations. Standards of Medical Care in Diabetes (ADA).       CBG: No results for input(s): GLUCAP in the last 168 hours.  Review of Systems:   Pertinent positives and negatives per HPI.  Otherwise a 12 point ROS was negative  Past Medical History:  He,  has a past medical history of Anxiety, Atrophy, disuse, muscle, BPH (benign prostatic hyperplasia), Cerebral palsy ([place]), Eczema, Elevated lipids, Elevated  PSA, Exotropia, Hyperlipidemia, Kidney stone, Leukopenia, Lower extremity edema, Mental retardation, Over weight, Prostate cancer (HCC), Venous insufficiency, Vitamin D deficiency, and Wheelchair dependence.   Surgical History:   Past Surgical History:  Procedure Laterality Date  . CYSTOSCOPY/URETEROSCOPY/HOLMIUM LASER/STENT PLACEMENT Right 08/14/2017   Procedure: CYSTOSCOPY/URETEROSCOPY/HOLMIUM LASER/STENT PLACEMENT;  Surgeon: Riki Altes, MD;  Location: [facility] ORS;  Service: Urology;  Laterality: Right;  . leg circulation surgery Right   . PROSTATE SURGERY  11/10/2008   BRACHYTHERAPY     Social History:   reports that he has never smoked. He has never used smokeless tobacco. He reports that he does not drink alcohol and does not use drugs.   Family History:  His family history includes Heart attack in his brother; Heart disease in an other family member.   Allergies No Known Allergies   Home Medications  Prior to Admission medications   Medication Sig Start Date End Date Taking?  Authorizing Provider  acetaminophen (TYLENOL) 325 MG tablet TAKE 2 TABLETS (650 MG TOTAL) BY MOUTH EVERY 6 HOURS AS NEEDED FOR MILD PAIN OR HEADACHE. 01/29/20   Alba Cory, MD  alfuzosin (UROXATRAL) 10 MG 24 hr tablet Take 1 tablet (10 mg total) by mouth daily. 03/01/20   Sowles, Danna Hefty, MD  ALPRAZolam Prudy Feeler) 0.25 MG tablet TAKE ONE TABLET BY MOUTH 2 TIMES A DAY AS NEEDED FOR ANXIETY (WHEN BLOOD PRESSURE IS VERY HIGH OR ANXIOUS) 06/02/19   Sowles, Danna Hefty, MD  ARTIFICIAL TEARS 0.2-0.2-1 % SOLN PLACE 2 DROPS INTO EYE 2 TIMES A DAY AS NEEDED 07/24/19   Carlynn Purl, Danna Hefty, MD  ascorbic acid (VITAMIN C) 500 MG tablet TAKE ONE TABLET BY MOUTH ONCE DAILY 03/15/20   Alba Cory, MD  aspirin (ASPIR-LOW) 81 MG EC tablet TAKE 1 TABLET BY MOUTH ONCE DAILY 11/18/19   Alba Cory, MD  atorvastatin (LIPITOR) 40 MG tablet Take 1 tablet (40 mg total) by mouth every evening. 03/01/20   Alba Cory, MD  augmented betamethasone dipropionate (DIPROLENE-AF) 0.05 % cream APPLY TOPICALLY AS DIRECTED TO RASH AS NEEDED 06/17/19   Alba Cory, MD  Calcium Carb-Cholecalciferol (CALCIUM-VITAMIN D) 500-400 MG-UNIT TABS TAKE 1 TABLET BY MOUTH 2 TIMES A DAY 02/16/20   Sowles, Danna Hefty, MD  calcium carbonate (OS-CAL) 1250 (500 Ca) MG chewable tablet Chew by mouth.    [provider]  Cranberry Juice Powder 425 MG CAPS TAKE 1 CAPSULE BY MOUTH ONCE DAILY 11/18/19   Alba Cory, MD  Elastic Bandages & Supports (MEDICAL COMPRESSION STOCKINGS) MISC 1 Units by Does not apply route daily. 06/02/19   Alba Cory, MD  Incontinence Supply Disposable (DEPEND SILHOUETTE BRIEFS L/XL) MISC 1 each by Does not apply route 2 (two) times daily as needed. 06/02/19   Alba Cory, MD  lubiprostone (AMITIZA) 24 MCG capsule TAKE 1 CAPSULE BY MOUTH 2 TIMES DAILY WITH A MEAL 03/01/20   Sowles, Danna Hefty, MD  Nutritional Supplements (ENSURE NUTRITION SHAKE) LIQD Take 1 each by mouth 2 (two) times daily. 06/02/19   Alba Cory, MD   Polyethyl Glycol-Propyl Glycol (SYSTANE ULTRA) 0.4-0.3 % SOLN Apply 1 drop to eye 2 (two) times daily. Both eyes 06/02/19   Alba Cory, MD  sertraline (ZOLOFT) 100 MG tablet Take 1 tablet (100 mg total) by mouth daily. 03/01/20   Alba Cory, MD     Critical care time: 60 minutes

## 2020-04-19 NOTE — ED Provider Notes (Signed)
[facility] Emergency Department Provider Note  ____________________________________________  Time seen: Approximately 10:46 AM  I have reviewed the triage vital signs and the nursing notes.   HISTORY  Chief Complaint Shortness of Breath    Level 5 Caveat: Portions of the History and Physical including HPI and review of systems are unable to be completely obtained due to patient being a poor historian   HPI [name] is a 79 y.o. male with a past history of cerebral palsy, kidney stone, prostate cancer  is brought to the ED due to fever yesterday for which she was given Tylenol and altered mental status and shortness of breath.  Patient does not relate any specific complaints but staff at his group home reported to EMS that he is talking less, eating less, moving less.  No vomiting or diarrhea.  EMS reports initial blood pressure was 130/90,, otherwise normal vital signs with oxygen saturation of 93% on room air.  During transit they did get one blood pressure that was 68/49 which they feel is likely erroneous.     Past Medical History:  Diagnosis Date  . Anxiety   . Atrophy, disuse, muscle   . BPH (benign prostatic hyperplasia)   . Cerebral palsy ([place])   . Eczema   . Elevated lipids   . Elevated PSA   . Exotropia   . Hyperlipidemia   . Kidney stone   . Leukopenia   . Lower extremity edema    lymphedema  . Mental retardation   . Over weight   . Prostate cancer ([place])   . Venous insufficiency   . Vitamin D deficiency   . Wheelchair dependence      Patient Active Problem List   Diagnosis Date Noted  . Sepsis due to pneumonia ([place]) 04/19/2020  . Personal history of urinary calculi 06/21/2019  . PAD (peripheral artery disease) ([place]) 07/04/2017  . Aortic valve calcification 07/04/2017  . Coronary artery calcification 07/04/2017  . Late effect acute polio 05/17/2016  . Chronic constipation 11/19/2015  . Mild major depression ([place])  11/19/2015  . Bacteriuria, chronic 09/11/2015  . History of prostate cancer 03/02/2015  . Amyotrophia 03/02/2015  . Edema leg 03/02/2015  . Cerebral palsy ([place]) 03/02/2015  . Dyslipidemia 03/02/2015  . Dermatitis, eczematoid 03/02/2015  . 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 H/O acute poliomyelitis 03/02/2015  . Lymphedema 03/02/2015  . Intellectual disability 03/02/2015  . Hypertrophy of nail 03/02/2015  . Chronic venous insufficiency 03/02/2015  . Avitaminosis D 03/02/2015  . Adynamia 03/02/2015  . Dependent on wheelchair 03/02/2015  . Renal cysts, acquired, bilateral 03/02/2015  . At risk for falling 03/02/2015  . Cerebral vascular disease 03/02/2015  . Strabismus 02/11/2015  . Urge incontinence of urine 02/11/2015  . Wheelchair dependence 02/11/2015  . Urinary frequency 10/30/2014     Past Surgical History:  Procedure Laterality Date  . CYSTOSCOPY/URETEROSCOPY/HOLMIUM LASER/STENT PLACEMENT Right 08/14/2017   Procedure: CYSTOSCOPY/URETEROSCOPY/HOLMIUM LASER/STENT PLACEMENT;  Surgeon: Abbie Sons, MD;  Location: [facility] ORS;  Service: Urology;  Laterality: Right;  . leg circulation surgery Right   . PROSTATE SURGERY  11/10/2008   BRACHYTHERAPY     Prior to Admission medications   Medication Sig Start Date End Date Taking? Authorizing Provider  acetaminophen (TYLENOL) 325 MG tablet TAKE 2 TABLETS (650 MG TOTAL) BY MOUTH EVERY 6 HOURS AS NEEDED FOR MILD PAIN OR HEADACHE. 01/29/20   Steele Sizer, MD  alfuzosin (UROXATRAL) 10 MG 24 hr tablet Take 1 tablet (10 mg total)  by mouth daily. 03/01/20   Sowles, Danna Hefty, MD  ALPRAZolam Prudy Feeler) 0.25 MG tablet TAKE ONE TABLET BY MOUTH 2 TIMES A DAY AS NEEDED FOR ANXIETY (WHEN BLOOD PRESSURE IS VERY HIGH OR ANXIOUS) 06/02/19   Sowles, Danna Hefty, MD  ARTIFICIAL TEARS 0.2-0.2-1 % SOLN PLACE 2 DROPS INTO EYE 2 TIMES A DAY AS NEEDED 07/24/19   Carlynn Purl, Danna Hefty, MD  ascorbic acid (VITAMIN C) 500 MG tablet TAKE ONE TABLET BY MOUTH ONCE DAILY 03/15/20    Alba Cory, MD  aspirin (ASPIR-LOW) 81 MG EC tablet TAKE 1 TABLET BY MOUTH ONCE DAILY 11/18/19   Alba Cory, MD  atorvastatin (LIPITOR) 40 MG tablet Take 1 tablet (40 mg total) by mouth every evening. 03/01/20   Alba Cory, MD  augmented betamethasone dipropionate (DIPROLENE-AF) 0.05 % cream APPLY TOPICALLY AS DIRECTED TO RASH AS NEEDED 06/17/19   Alba Cory, MD  Calcium Carb-Cholecalciferol (CALCIUM-VITAMIN D) 500-400 MG-UNIT TABS TAKE 1 TABLET BY MOUTH 2 TIMES A DAY 02/16/20   Sowles, Danna Hefty, MD  calcium carbonate (OS-CAL) 1250 (500 Ca) MG chewable tablet Chew by mouth.    [provider]  Cranberry Juice Powder 425 MG CAPS TAKE 1 CAPSULE BY MOUTH ONCE DAILY 11/18/19   Alba Cory, MD  Elastic Bandages & Supports (MEDICAL COMPRESSION STOCKINGS) MISC 1 Units by Does not apply route daily. 06/02/19   Alba Cory, MD  Incontinence Supply Disposable (DEPEND SILHOUETTE BRIEFS L/XL) MISC 1 each by Does not apply route 2 (two) times daily as needed. 06/02/19   Alba Cory, MD  lubiprostone (AMITIZA) 24 MCG capsule TAKE 1 CAPSULE BY MOUTH 2 TIMES DAILY WITH A MEAL 03/01/20   Sowles, Danna Hefty, MD  Nutritional Supplements (ENSURE NUTRITION SHAKE) LIQD Take 1 each by mouth 2 (two) times daily. 06/02/19   Alba Cory, MD  Polyethyl Glycol-Propyl Glycol (SYSTANE ULTRA) 0.4-0.3 % SOLN Apply 1 drop to eye 2 (two) times daily. Both eyes 06/02/19   Alba Cory, MD  sertraline (ZOLOFT) 100 MG tablet Take 1 tablet (100 mg total) by mouth daily. 03/01/20   Alba Cory, MD     Allergies Patient has no known allergies.   Family History  Problem Relation Age of Onset  . Heart attack Brother   . Heart disease Other     Social History Social History   Tobacco Use  . Smoking status: Never Smoker  . Smokeless tobacco: Never Used  Vaping Use  . Vaping Use: Never used  Substance Use Topics  . Alcohol use: No    Alcohol/week: 0.0 standard drinks  . Drug use: No     Review of Systems Level 5 Caveat: Portions of the History and Physical including HPI and review of systems are unable to be completely obtained due to patient being a poor historian   Constitutional: Positive subjective fever.  ENT:   No rhinorrhea. Cardiovascular:   No chest pain or syncope. Respiratory: Positive shortness of breath without cough. Gastrointestinal:   Negative for abdominal pain, vomiting and diarrhea.  Musculoskeletal:   Negative for focal pain or swelling ____________________________________________   PHYSICAL EXAM:  VITAL SIGNS: ED Triage Vitals  Enc Vitals Group     BP 04/19/20 1006 (!) 111/93     Pulse Rate 04/19/20 1006 87     Resp 04/19/20 1005 16     Temp 04/19/20 1013 98.1 F (36.7 C)     Temp Source 04/19/20 1013 Oral     SpO2 04/19/20 1005 93 %     Weight 04/19/20 1006  132 lb 4.4 oz (60 kg)     Height 04/19/20 1006 5\' 10"  (1.778 m)     Head Circumference --      Peak Flow --      Pain Score --      Pain Loc --      Pain Edu? --      Excl. in [place]? --     Vital signs reviewed, nursing assessments reviewed.   Constitutional:   Alert and oriented to self.  Eyes:   Conjunctivae are normal. EOMI. PERRL. ENT      Head:   Normocephalic and atraumatic.      Nose:   No congestion/rhinnorhea.       Mouth/Throat:   Dry mucous membranes, no pharyngeal erythema. No peritonsillar mass.       Neck:   No meningismus. Full ROM. Hematological/Lymphatic/Immunilogical:   No cervical lymphadenopathy. Cardiovascular:   RRR. Symmetric bilateral radial and DP pulses.  No murmurs. Cap refill less than 2 seconds. Respiratory:   Normal respiratory effort without tachypnea/retractions.  Diminished breath sounds at bilateral bases Gastrointestinal:   Soft and nontender. Non distended. There is no CVA tenderness.  No rebound, rigidity, or guarding. Musculoskeletal:   Normal range of motion in all extremities. No joint effusions.  No lower extremity tenderness.  No  edema. Neurologic:   Very limited language, dysarthric speech Motor grossly intact. No acute focal neurologic deficits are appreciated.  Skin:    Skin is warm, dry and intact. No rash noted.  No petechiae, purpura, or bullae.  ____________________________________________    LABS (pertinent positives/negatives) (all labs ordered are listed, but only abnormal results are displayed) Labs Reviewed  COMPREHENSIVE METABOLIC PANEL - Abnormal; Notable for the following components:      Result Value   Glucose, Bld 128 (*)    BUN 59 (*)    Creatinine, Ser 1.58 (*)    Calcium 8.5 (*)    Total Protein 5.8 (*)    Albumin 2.7 (*)    AST 83 (*)    ALT 65 (*)    GFR, Estimated 45 (*)    All other components within normal limits  CBC WITH DIFFERENTIAL/PLATELET - Abnormal; Notable for the following components:   RBC 2.92 (*)    Hemoglobin 9.6 (*)    HCT 29.0 (*)    Platelets 79 (*)    Lymphs Abs 0.6 (*)    Abs Immature Granulocytes 0.60 (*)    All other components within normal limits  LACTIC ACID, PLASMA - Abnormal; Notable for the following components:   Lactic Acid, Venous 3.8 (*)    All other components within normal limits  RESP PANEL BY RT-PCR (FLU A&B, COVID) ARPGX2  CULTURE, BLOOD (ROUTINE X 2)  CULTURE, BLOOD (ROUTINE X 2)  LIPASE, BLOOD  LACTIC ACID, PLASMA  LACTIC ACID, PLASMA  LACTIC ACID, PLASMA  PROCALCITONIN   ____________________________________________   EKG    ____________________________________________    RADIOLOGY  DG Chest Portable 1 View  Result Date: 04/19/2020 CLINICAL DATA:  sob EXAM: PORTABLE CHEST 1 VIEW COMPARISON:  03/09/2019 and prior. FINDINGS: Hypoinflated lungs. No pneumothorax or pleural effusion. Diffuse interstitial prominence and patchy bibasilar opacities. Cardiomediastinal silhouette within normal limits. IMPRESSION: Patchy bibasilar opacities, atelectasis versus infiltrate. Interstitial prominence may reflect chronic sequela.  Electronically Signed   By: Primitivo Gauze M.D.   On: 04/19/2020 10:35    ____________________________________________   PROCEDURES .Critical Care Performed by: Carrie Mew, MD Authorized by: Carrie Mew, MD  Critical care provider statement:    Critical care time (minutes):  35   Critical care time was exclusive of:  Separately billable procedures and treating other patients   Critical care was necessary to treat or prevent imminent or life-threatening deterioration of the following conditions:  Respiratory failure, sepsis and shock   Critical care was time spent personally by me on the following activities:  Development of treatment plan with patient or surrogate, discussions with consultants, evaluation of patient's response to treatment, examination of patient, obtaining history from patient or surrogate, ordering and performing treatments and interventions, ordering and review of laboratory studies, ordering and review of radiographic studies, pulse oximetry, re-evaluation of patient's condition and review of old charts    ____________________________________________  DIFFERENTIAL DIAGNOSIS   Pleural effusion, pneumonia, Covid, pulmonary edema, pneumothorax  CLINICAL IMPRESSION / ASSESSMENT AND PLAN / ED COURSE  Medications ordered in the ED: Medications  cefTRIAXone (ROCEPHIN) 2 g in sodium chloride 0.9 % 100 mL IVPB (0 g Intravenous Stopped 04/19/20 1125)  azithromycin (ZITHROMAX) 500 mg in sodium chloride 0.9 % 250 mL IVPB (500 mg Intravenous New Bag/Given 04/19/20 1330)  aspirin chewable tablet 81 mg (has no administration in time range)  atorvastatin (LIPITOR) tablet 40 mg (has no administration in time range)  ALPRAZolam (XANAX) tablet 0.25 mg (has no administration in time range)  sertraline (ZOLOFT) tablet 100 mg (has no administration in time range)  lubiprostone (AMITIZA) capsule 24 mcg (has no administration in time range)  alfuzosin (UROXATRAL)  24 hr tablet 10 mg (has no administration in time range)  lactose free nutrition (BOOST PLUS) liquid 237 mL (has no administration in time range)  enoxaparin (LOVENOX) injection 40 mg (has no administration in time range)  sodium chloride flush (NS) 0.9 % injection 3 mL (has no administration in time range)  lactated ringers infusion (has no administration in time range)  acetaminophen (TYLENOL) tablet 650 mg (has no administration in time range)    Or  acetaminophen (TYLENOL) suppository 650 mg (has no administration in time range)  ondansetron (ZOFRAN) tablet 4 mg (has no administration in time range)    Or  ondansetron (ZOFRAN) injection 4 mg (has no administration in time range)  polyvinyl alcohol (LIQUIFILM TEARS) 1.4 % ophthalmic solution 2 drop (has no administration in time range)  0.9 %  sodium chloride infusion (has no administration in time range)  norepinephrine (LEVOPHED) 4mg  in 273mL premix infusion (has no administration in time range)  sodium chloride 0.9 % bolus 500 mL (has no administration in time range)  sodium chloride 0.9 % bolus 1,000 mL (0 mLs Intravenous Stopped 04/19/20 1125)  sodium chloride 0.9 % bolus 1,000 mL (0 mLs Intravenous Stopped 04/19/20 1325)    Pertinent labs & imaging results that were available during my care of the patient were reviewed by me and considered in my medical decision making (see chart for details).   ZACHERIAH SHAMAH was evaluated in Emergency Department on 04/19/2020 for the symptoms described in the history of present illness. He was evaluated in the context of the global COVID-19 pandemic, which necessitated consideration that the patient might be at risk for infection with the SARS-CoV-2 virus that causes COVID-19. Institutional protocols and algorithms that pertain to the evaluation of patients at risk for COVID-19 are in a state of rapid change based on information released by regulatory bodies including the CDC and federal and state  organizations. These policies and algorithms were followed during the patient's care in the ED.  Patient presents with shortness of breath, low normal oxygen saturation, fatigue.  History is limited.  Blood pressure normal on arrival to the ED.  Patient is not septic on initial assessment.  Will check labs and chest x-ray, Covid test.  Clinical Course as of 04/19/20 1436  Mon Apr 19, 2020  1035 Chest x-ray image viewed by me, shows bilateral lower lung opacities concerning for multifocal pneumonia versus Covid. [PS]  1206 Covid and flu are negative.  Creatinine of 1.5 represents AKI from his baseline of 0.5.  Additionally, while receiving treatment, already receiving antibiotics, he has had repeated hypotensive blood pressures.  Will give additional liter of saline for 30 mL/kg bolus.  Will check lactate and blood cultures. [PS]  1218 Last bp 78/47. Second liter NS starting. May need ICU pending response to IVF and lactate level [PS]  1435 NS bolus has completed.  Sepsis reassessment has been completed. Still hypotensive with MAP ~60. Will start levophed and discuss with ICU team. [PS]    Clinical Course User Index [PS] Sharman Cheek, MD     ____________________________________________   FINAL CLINICAL IMPRESSION(S) / ED DIAGNOSES    Final diagnoses:  Multifocal pneumonia  Septic shock [facility])     ED Discharge Orders    None      Portions of this note were generated with dragon dictation software. Dictation errors may occur despite best attempts at proofreading.   Sharman Cheek, MD 04/19/20 [phone]

## 2020-04-19 NOTE — ED Notes (Signed)
Report was called to [employer] and spoke with [name], the day Production designer, theatre/television/film.

## 2020-04-19 NOTE — Progress Notes (Signed)
Pharmacy Antibiotic Note  [name] is a 79 y.o. male admitted on 04/19/2020 with pneumonia and sepsis.  Pharmacy has been consulted for Vancomycin and Cefepime dosing.  Plan: Vancomycin 1250 mg IV loading dose followed by 750mg  IV q24h  Cefepime 2g IV q12h  Height: 5\' 10"  (177.8 cm) Weight: 60 kg (132 lb 4.4 oz) IBW/kg (Calculated) : 73  Temp (24hrs), Avg:98.1 F (36.7 C), Min:98.1 F (36.7 C), Max:98.1 F (36.7 C)  Recent Labs  Lab 04/19/20 1009 04/19/20 1231 04/19/20 1447  WBC 9.1  --   --   CREATININE 1.58*  --   --   LATICACIDVEN  --  3.8* 2.9*    Estimated Creatinine Clearance: 33.2 mL/min (A) (by C-G formula based on SCr of 1.58 mg/dL (H)).    No Known Allergies  Antimicrobials this admission: Ceftriaxone/Azithromycin 12/27 >> 12/27 Cefepime 12/27 >>  Vancomycin 12/27 >>   Dose adjustments this admission:  Microbiology results:  Thank you for allowing pharmacy to be a part of this patient's care.  1/28, PharmD, BCPS 04/19/2020 5:02 PM

## 2020-04-19 NOTE — ED Notes (Signed)
Patient's guardian's name is Loann Quill, phone number is [phone].

## 2020-04-19 NOTE — Consult Note (Signed)
ER Consult Note   [name]:[id number] DOB: [birth year] DOA: 04/19/2020  PCP: Alba Cory, MD Consultants:  [facility] - urology; [facility] - oncology Patient coming from: [facility]; NOK:   Chief Complaint: AMS, SOB  HPI: [name] is a 79 y.o. male with medical history significant of cerebral palsy with intellectual disability; prostate CA; lymphedema; HLD; and BPH presenting with AMS, SOB.  Patient is unable to provide history, makes nonsensical utterances and follow some commands.  Per EDP note, he is talking less, eating less, moving less.    At the time I was called, the patient was hypotensive but had not received his full IVF bolus and so was thought to be stable for admission to [facility].  He has subsequently been repeatedly hypotensive and so is being started on Levophed and will be admitted to ICU.    ED Course:  Group home - SOB, weakness, fever x 24 hours.  COVID/flu negative, appears to have PNA.  Very dehydrated.  Creatinine 0.5 -> 1.5.  BP decreasing, getting 2nd liter of saline.  Drawing cultures and bolusing.  Given Rocephin.  Current BP is 78/45.  Full code.  Review of Systems:  Unable to perform  Ambulatory Status:  Non-ambulatory  COVID Vaccine Status:  Complete  Past Medical History:  Diagnosis Date  . Anxiety   . Atrophy, disuse, muscle   . BPH (benign prostatic hyperplasia)   . Cerebral palsy (HCC)   . Eczema   . Elevated lipids   . Elevated PSA   . Exotropia   . Hyperlipidemia   . Kidney stone   . Leukopenia   . Lower extremity edema    lymphedema  . Mental retardation   . Over weight   . Prostate cancer (HCC)   . Venous insufficiency   . Vitamin D deficiency   . Wheelchair dependence     Past Surgical History:  Procedure Laterality Date  . CYSTOSCOPY/URETEROSCOPY/HOLMIUM LASER/STENT PLACEMENT Right 08/14/2017   Procedure: CYSTOSCOPY/URETEROSCOPY/HOLMIUM LASER/STENT PLACEMENT;  Surgeon: Riki Altes, MD;  Location:  [facility] ORS;  Service: Urology;  Laterality: Right;  . leg circulation surgery Right   . PROSTATE SURGERY  11/10/2008   BRACHYTHERAPY    Social History   Socioeconomic History  . Marital status: Single    Spouse name: Not on file  . Number of children: Not on file  . Years of education: Not on file  . Highest education level: Not on file  Occupational History  . Not on file  Tobacco Use  . Smoking status: Never Smoker  . Smokeless tobacco: Never Used  Vaping Use  . Vaping Use: Never used  Substance and Sexual Activity  . Alcohol use: No    Alcohol/week: 0.0 standard drinks  . Drug use: No  . Sexual activity: Not Currently  Other Topics Concern  . Not on file  Social History Narrative   Lives at [facility] group home, 5 males. Moved to higher level of care 04/28/19 with [facility].     Social Determinants of Health   Financial Resource Strain: Low Risk   . Difficulty of Paying Living Expenses: Not hard at all  Food Insecurity: No Food Insecurity  . Worried About Programme researcher, broadcasting/film/video in the Last Year: Never true  . Ran Out of Food in the Last Year: Never true  Transportation Needs: No Transportation Needs  . Lack of Transportation (Medical): No  . Lack of Transportation (Non-Medical): No  Physical Activity:  Inactive  . Days of Exercise per Week: 0 days  . Minutes of Exercise per Session: 0 min  Stress: No Stress Concern Present  . Feeling of Stress : Not at all  Social Connections: Socially Isolated  . Frequency of Communication with Friends and Family: Never  . Frequency of Social Gatherings with Friends and Family: Never  . Attends Religious Services: Never  . Active Member of Clubs or Organizations: No  . Attends Archivist Meetings: Never  . Marital Status: Never married  Intimate Partner Violence: Not At Risk  . Fear of Current or Ex-Partner: No  . Emotionally Abused: No  . Physically Abused: No  . Sexually Abused: No    No Known  Allergies  Family History  Problem Relation Age of Onset  . Heart attack Brother   . Heart disease Other     Prior to Admission medications   Medication Sig Start Date End Date Taking? Authorizing Provider  acetaminophen (TYLENOL) 325 MG tablet TAKE 2 TABLETS (650 MG TOTAL) BY MOUTH EVERY 6 HOURS AS NEEDED FOR MILD PAIN OR HEADACHE. 01/29/20   Steele Sizer, MD  alfuzosin (UROXATRAL) 10 MG 24 hr tablet Take 1 tablet (10 mg total) by mouth daily. 03/01/20   Sowles, Drue Stager, MD  ALPRAZolam Duanne Moron) 0.25 MG tablet TAKE ONE TABLET BY MOUTH 2 TIMES A DAY AS NEEDED FOR ANXIETY (WHEN BLOOD PRESSURE IS VERY HIGH OR ANXIOUS) 06/02/19   Sowles, Drue Stager, MD  ARTIFICIAL TEARS 0.2-0.2-1 % SOLN PLACE 2 DROPS INTO EYE 2 TIMES A DAY AS NEEDED 07/24/19   Ancil Boozer, Drue Stager, MD  ascorbic acid (VITAMIN C) 500 MG tablet TAKE ONE TABLET BY MOUTH ONCE DAILY 03/15/20   Steele Sizer, MD  aspirin (ASPIR-LOW) 81 MG EC tablet TAKE 1 TABLET BY MOUTH ONCE DAILY 11/18/19   Steele Sizer, MD  atorvastatin (LIPITOR) 40 MG tablet Take 1 tablet (40 mg total) by mouth every evening. 03/01/20   Steele Sizer, MD  augmented betamethasone dipropionate (DIPROLENE-AF) 0.05 % cream APPLY TOPICALLY AS DIRECTED TO RASH AS NEEDED 06/17/19   Steele Sizer, MD  Calcium Carb-Cholecalciferol (CALCIUM-VITAMIN D) 500-400 MG-UNIT TABS TAKE 1 TABLET BY MOUTH 2 TIMES A DAY 02/16/20   Sowles, Drue Stager, MD  calcium carbonate (OS-CAL) 1250 (500 Ca) MG chewable tablet Chew by mouth.    [provider]  Cranberry Juice Powder 425 MG CAPS TAKE 1 CAPSULE BY MOUTH ONCE DAILY 11/18/19   Steele Sizer, MD  Elastic Bandages & Supports (MEDICAL COMPRESSION STOCKINGS) MISC 1 Units by Does not apply route daily. 06/02/19   Steele Sizer, MD  Incontinence Supply Disposable (DEPEND SILHOUETTE BRIEFS L/XL) MISC 1 each by Does not apply route 2 (two) times daily as needed. 06/02/19   Steele Sizer, MD  lubiprostone (AMITIZA) 24 MCG capsule TAKE 1 CAPSULE  BY MOUTH 2 TIMES DAILY WITH A MEAL 03/01/20   Sowles, Drue Stager, MD  Nutritional Supplements (ENSURE NUTRITION SHAKE) LIQD Take 1 each by mouth 2 (two) times daily. 06/02/19   Steele Sizer, MD  Polyethyl Glycol-Propyl Glycol (SYSTANE ULTRA) 0.4-0.3 % SOLN Apply 1 drop to eye 2 (two) times daily. Both eyes 06/02/19   Steele Sizer, MD  sertraline (ZOLOFT) 100 MG tablet Take 1 tablet (100 mg total) by mouth daily. 03/01/20   Steele Sizer, MD    Physical Exam: Vitals:   04/19/20 1331 04/19/20 1400 04/19/20 1430 04/19/20 1500  BP: (!) 81/51 (!) 74/57 (!) 72/52 (!) 73/52  Pulse: 88 93 94 87  Resp: Marland Kitchen)  24 19 (!) 25 (!) 23  Temp:      TempSrc:      SpO2: 96% 95% 97% 93%  Weight:      Height:         . General:  Alert, some interaction (his BP cuff was tight and bothering him and I said "tight" and he repeated "tight" over and over); follows some commands . Eyes:  Exotropia, normal lids, iris . ENT:  grossly normal lips & tongue, dry mm . Neck:  no LAD, masses or thyromegaly . Cardiovascular:  RRR, no m/r/g. No LE edema.  Marland Kitchen Respiratory:   CTA bilaterally with no wheezes/rales/rhonchi.  Mildly increased respiratory effort. . Abdomen:  soft, NT, ND, NABS . Skin:  no rash or induration seen on limited exam . Musculoskeletal:  Contractures of all extremities but less so of LUE . Psychiatric:  Alert, pleasant, speech mostly nonsensical, cognitive impairment . Neurologic:  Unable to perform    Radiological Exams on Admission: Independently reviewed - see discussion in A/P where applicable  DG Chest Portable 1 View  Result Date: 04/19/2020 CLINICAL DATA:  sob EXAM: PORTABLE CHEST 1 VIEW COMPARISON:  03/09/2019 and prior. FINDINGS: Hypoinflated lungs. No pneumothorax or pleural effusion. Diffuse interstitial prominence and patchy bibasilar opacities. Cardiomediastinal silhouette within normal limits. IMPRESSION: Patchy bibasilar opacities, atelectasis versus infiltrate. Interstitial  prominence may reflect chronic sequela. Electronically Signed   By: Primitivo Gauze M.D.   On: 04/19/2020 10:35    EKG: Independently reviewed.  Afib with rate 89; nonspecific ST changes with low voltage  Labs on Admission: I have personally reviewed the available labs and imaging studies at the time of the admission.  Pertinent labs:   Glucose 128 BUN 59/Creatinine 1.58/GFR 45; 34/0.57/115 on 11/8 Albumin 2.7 AST 83/ALT 65 WBC 9.1 Hgb 9.6 Platelets 79; 170 on 11/8 COVID/flu negative Lactate 3.8, 2.9   Assessment/Plan Principal Problem:   Sepsis due to undetermined organism [facility]) Active Problems:   History of prostate cancer   Cerebral palsy (HCC)   Dyslipidemia   Intellectual disability   Shock, hypovolemic vs. septic -Sepsis indicates life-threatening organ dysfunction with mortality >10%, caused by dysregulation to host response.   -SIRS criteria in this patient includes: Tachypnea; because there is only 1 positive SIRS criteria this may not be sepsis - but it is certainly concerning for this -Patient has evidence of acute organ failure with elevated lactate >2; recurrent hypotension (SBP < 90 or MAP < 65 x 2 readings); platelets <100 that is not easily explained by another condition. -While awaiting blood cultures, this appears to be a preseptic condition. -Sepsis protocol initiated -Patient had recurrent SBP <90/MAP <65 and so has received the 30 cc/kg IVF bolus; given persistent hypotension, he will be admitted to the ICU for pressors at this time -Suspected source is unknown; CXR is mildly elevated and he has h/o nephrolithiasis and UA has not been checked.  Will order UA and cultures now. -Blood and urine cultures pending -Treated with IV Rocephin/Azithromycin for presumed PNA by EDP but he may benefit from broadening of antibiotics -He may also benefit from CT C/A/P to further assess for source - lungs vs. GU given h/o nephrolithiasis -Will trend lactate to ensure  improvement -Will order procalcitonin level.   -This patient is in hypovolemic and/or septic shock and requires vasopressors to keep MAP >65 and/or due to lactate >2 despite volume resuscitation; shock is associated with >40% mortality.  CP/ID/functional quadriplegia -This patient has a baseline chronic medical  condition that requires total care by caregivers  -The patient has demonstrated inability to feed/groom herself, is nonambulatory, is incontinent, has contractures -His functional quadriplegia places him at higher risks for complications from acute illness and requires greater resources for his care -Continue Xanax, Zoloft  HLD -Continue Lipitor  H/o prostate CA -Listed on PMH but there was no mention of this at his 12/2018 oncology visit      Note: This patient has been tested and is negative for the novel coronavirus COVID-19. He has been fully vaccinated against COVID-19 and also should have hybrid immunity due to h/o past infection.   TR[facility] will sign off at this time.  We will be happy to assume care of the patient when he is no longer requiring ICU-level care.    Jonah Blue MD [facility]   How to contact the [facility] Attending or Consulting provider 7A - 7P or covering provider during after hours 7P -7A, for this patient?  1. Check the care team in [facility] and look for a) attending/consulting [facility] provider listed and b) the [facility] team listed 2. Log into [email or web address] and use [place]'s universal password to access. If you do not have the password, please contact the hospital operator. 3. Locate the [facility] provider you are looking for under [facility] and page to a number that you can be directly reached. 4. If you still have difficulty reaching the provider, please page the [facility] (Director on Call) for the Hospitalists listed on amion for assistance.   04/19/2020, 3:30 PM

## 2020-04-19 NOTE — ED Notes (Signed)
Patient's niece, Loann Quill, who is his guardian, is at bedside.

## 2020-04-19 NOTE — ED Notes (Signed)
Dr. Scotty Court aware of lactic acid of 3.8

## 2020-04-20 DIAGNOSIS — A419 Sepsis, unspecified organism: Secondary | ICD-10-CM | POA: Diagnosis not present

## 2020-04-20 LAB — CBC
HCT: 27.9 % — ABNORMAL LOW (ref 39.0–52.0)
Hemoglobin: 9.7 g/dL — ABNORMAL LOW (ref 13.0–17.0)
MCH: 33.9 pg (ref 26.0–34.0)
MCHC: 34.8 g/dL (ref 30.0–36.0)
MCV: 97.6 fL (ref 80.0–100.0)
Platelets: 76 10*3/uL — ABNORMAL LOW (ref 150–400)
RBC: 2.86 MIL/uL — ABNORMAL LOW (ref 4.22–5.81)
RDW: 13.5 % (ref 11.5–15.5)
WBC: 8.9 10*3/uL (ref 4.0–10.5)
nRBC: 0 % (ref 0.0–0.2)

## 2020-04-20 LAB — BASIC METABOLIC PANEL
Anion gap: 9 (ref 5–15)
BUN: 61 mg/dL — ABNORMAL HIGH (ref 8–23)
CO2: 21 mmol/L — ABNORMAL LOW (ref 22–32)
Calcium: 8.4 mg/dL — ABNORMAL LOW (ref 8.9–10.3)
Chloride: 111 mmol/L (ref 98–111)
Creatinine, Ser: 1.01 mg/dL (ref 0.61–1.24)
GFR, Estimated: >60 mL/min (ref >60)
Glucose, Bld: 88 mg/dL (ref 70–99)
Potassium: 3.3 mmol/L — ABNORMAL LOW (ref 3.5–5.1)
Sodium: 141 mmol/L (ref 135–145)

## 2020-04-20 LAB — MRSA PCR SCREENING: MRSA by PCR: NEGATIVE

## 2020-04-20 LAB — GLUCOSE, CAPILLARY: Glucose-Capillary: 126 mg/dL — ABNORMAL HIGH (ref 70–99)

## 2020-04-20 MED ORDER — PIPERACILLIN-TAZOBACTAM 3.375 G IVPB
3.3750 g | Freq: Three times a day (TID) | INTRAVENOUS | Status: DC
  Administered 2020-04-20 – 2020-04-23 (×9): 3.375 g via INTRAVENOUS
  Filled 2020-04-20 (×8): qty 50

## 2020-04-20 MED ORDER — ADULT MULTIVITAMIN W/MINERALS CH
1.0000 | ORAL_TABLET | Freq: Every day | ORAL | Status: DC
  Administered 2020-04-21 – 2020-04-23 (×3): 1 via ORAL
  Filled 2020-04-20 (×3): qty 1

## 2020-04-20 MED ORDER — ENSURE ENLIVE PO LIQD
237.0000 mL | Freq: Three times a day (TID) | ORAL | Status: DC
  Administered 2020-04-20 – 2020-04-22 (×6): 237 mL via ORAL

## 2020-04-20 MED ORDER — ASCORBIC ACID 500 MG PO TABS
250.0000 mg | ORAL_TABLET | Freq: Two times a day (BID) | ORAL | Status: DC
  Administered 2020-04-20 – 2020-04-23 (×6): 250 mg via ORAL
  Filled 2020-04-20 (×6): qty 1

## 2020-04-20 MED ORDER — LACTATED RINGERS IV BOLUS
1000.0000 mL | Freq: Once | INTRAVENOUS | Status: AC
  Administered 2020-04-20: 10:00:00 1000 mL via INTRAVENOUS

## 2020-04-20 NOTE — Progress Notes (Signed)
Initial Nutrition Assessment  DOCUMENTATION CODES:   Severe malnutrition in context of chronic illness  INTERVENTION:   Ensure Enlive po TID, each supplement provides 350 kcal and 20 grams of protein  Magic cup TID with meals, each supplement provides 290 kcal and 9 grams of protein  MVI daily   Vitamin C 235m po BID   Pt at high refeed risk; recommend monitor potassium, magnesium and phosphorus labs daily as oral intake improves  NUTRITION DIAGNOSIS:   Severe Malnutrition related to chronic illness (prostate cancer, cerebral palsy, quadriplegia) as evidenced by severe fat depletion,severe muscle depletion.  GOAL:   Patient will meet greater than or equal to 90% of their needs  MONITOR:   PO intake,Supplement acceptance,Labs,Weight trends,Skin,I & O's  REASON FOR ASSESSMENT:   Malnutrition Screening Tool    ASSESSMENT:   79yo male with history of cerebral palsy, intellectual disability (lives in a group home), prostate cancer, HLD and BPH who is admitted with sepsis secondary to UTI and possible PNA   Met with pt and family member at bedside today. Pt with AMS and is unable to provide nutrition related history. Family member at bedside reports pt with good appetite and oral intake at baseline; apparently pt loves sweets. Pt ate a few bites of grits and egg for breakfast this morning and ate about 90% of a piece of chocolate pie. Pt does drink chocolate Boost at baseline. RD will add supplements and vitamins to help pt meet his estimated needs and support wound healing. Per chart, pt is down 44lbs(25%) over the past 10 months; this is significant weight loss. Pt does appear to be weight stable since November.   Medications reviewed and include: aspirin, heparin, levophed, zosyn  Labs reviewed: K 3.3(L), BUN 61(H) Hgb 9.7(L), Hct 27.9(L)  NUTRITION - FOCUSED PHYSICAL EXAM:  Flowsheet Row Most Recent Value  Orbital Region Severe depletion  Upper Arm Region Severe  depletion  Thoracic and Lumbar Region Severe depletion  Buccal Region Severe depletion  Temple Region Severe depletion  Clavicle Bone Region Severe depletion  Clavicle and Acromion Bone Region Severe depletion  Scapular Bone Region Severe depletion  Dorsal Hand Severe depletion  Patellar Region Severe depletion  Anterior Thigh Region Severe depletion  Posterior Calf Region Severe depletion  Edema (RD Assessment) None  Hair Reviewed  Eyes Reviewed  Mouth Reviewed  Skin Reviewed  Nails Reviewed     Diet Order:   Diet Order            DIET - DYS 1 Room service appropriate? Yes; Fluid consistency: Thin  Diet effective now                EDUCATION NEEDS:   No education needs have been identified at this time  Skin:  Skin Assessment: Reviewed RN Assessment (Stage II sacrum)  Last BM:  12/28  Height:   Ht Readings from Last 1 Encounters:  04/19/20 5' 10" (1.778 m)    Weight:   Wt Readings from Last 1 Encounters:  04/20/20 61.4 kg    Ideal Body Weight:  75.45 kg  BMI:  Body mass index is 19.42 kg/m.  Estimated Nutritional Needs:   Kcal:  1800-2100kcal/day  Protein:  90-105g/day  Fluid:  1.5-1.8L/day  CKoleen DistanceMS, RD, LDN Please refer to APless[facility] LLCfor RD and/or RD on-call/weekend/after hours pager

## 2020-04-20 NOTE — Progress Notes (Signed)
NAME:  [name], MRN:  [id number], DOB:  Jun 28, 1940, LOS: 1 ADMISSION DATE:  04/19/2020, CONSULTATION DATE:  04/19/20 REFERRING MD:  ED physician, CHIEF COMPLAINT:  AMS and SOB  Brief History:  This is a 79 yo male with history of CP, intellectual disability, prostate cancer, HLD, and BPH who presented with AMS and SOB.    History of Present Illness:  This is a 79 yo male with history of CP, intellectual disability, prostate cancer, HLD, and BPH who presented with AMS and SOB.  Patient states that he became short of breath today.  However on speaking with niece who is the patient's guardian it seems that she was called last night that patient was hypotensive.  They were going to try to use fluids at his living facility to increase the blood pressure.  This is in the context of poor p.o. intake.  However patient was noted to have a fever this morning at his facility.  Niece is unaware as to the actual recording of the temperature.  Outside of this patient has been doing fairly well per the niece.  She notes that patient is able to answer simple questions but is not able to carry on complex conversations at baseline.  Patient appears near his baseline per niece.  Patient does endorse some abdominal tenderness in the suprapubic region.    Interval events: 04-20-2020 -patient with levo weaned to 2.  Overall stable cultures pending  Past Medical History:  CP BPH HLD Prostate cancer AMS  Significant Hospital Events:  Admitted to ICU on 04/19/20  Consults:  NA  Procedures:  NA  Significant Diagnostic Tests:  Chest x-ray with some increasing opacifications in the basilar regions.  But no overt dense consolidation appreciated.  It is difficult to really delineate the left-sided heart border  Micro Data:  Cultures pending  Antimicrobials:  Cefepime and Vanco for now  Interim History / Subjective:  NA  Objective   Blood pressure (!) 123/58, pulse 76, temperature 97.6 F  (36.4 C), temperature source Oral, resp. rate 20, height 5\' 10"  (1.778 m), weight 61.4 kg, SpO2 95 %.        Intake/Output Summary (Last 24 hours) at 04/20/2020 0953 Last data filed at 04/20/2020 0800 Gross per 24 hour  Intake 2903.81 ml  Output 520 ml  Net 2383.81 ml   Filed Weights   04/19/20 1006 04/20/20 0100  Weight: 60 kg 61.4 kg    Examination: General: Patient interactive and alert HENT: Moist mucous membranes Lungs: Intermittent rhonchi in bilateral lower lobes no increased work of breathing Cardiovascular: Regular rate and rhythm Abdomen: Soft nondistended.  Some tenderness in the suprapubic region Extremities: No edema appreciated Neuro: No focal deficits noted patient noted to have baseline contractures.  At baseline patient is bedridden GU: Deferred  Resolved Hospital Problem list   Not applicable  Assessment & Plan:  This is a 79 yo with history as noted above who presents for chief complaint of AMS and SOB.   Shock-likely secondary to UTI versus pneumonia.  Given history of recurrent UTIs would favor UTI.  Last urine culture grew Enterococcus faecalis that was sensitive to ampicillin nitrofurantoin and vancomycin -Broad-spectrum antibiotics with cefepime and vancomycin -Levophed as needed -Patient received 30 cc/kg in the ED -Can consider repeat bolus as patient with AKI on labs -We will give 1 more liter extended bolus throughout the day on 04-20-2020  Acute kidney injury-Baseline creatinine around 0.57 creatinine today at 1.58 -Avoid nephrotoxins -Fluid as  needed as patient with history of decreased p.o. intake and likely a prerenal state from inadequate perfusion  Depression -Sertraline   Anxiety -Xanax prn    Best practice (evaluated daily)  Diet: Pureed  Pain/Anxiety/Delirium protocol (if indicated): NA VAP protocol (if indicated): NA DVT prophylaxis: lovenox for now GI prophylaxis: NA Glucose control: Not indicated for now Mobility: Bed  bound Disposition:ICU  Goals of Care:   Code Status: Full code niece at bedside who is his guardian. She confirmed patient was full code  Labs   CBC: Recent Labs  Lab 04/19/20 1009 04/20/20 0532  WBC 9.1 8.9  NEUTROABS 7.5  --   HGB 9.6* 9.7*  HCT 29.0* 27.9*  MCV 99.3 97.6  PLT 79* 76*    Basic Metabolic Panel: Recent Labs  Lab 04/19/20 1009 04/20/20 0532  NA 137 141  K 3.9 3.3*  CL 106 111  CO2 22 21*  GLUCOSE 128* 88  BUN 59* 61*  CREATININE 1.58* 1.01  CALCIUM 8.5* 8.4*   GFR: Estimated Creatinine Clearance: 53.2 mL/min (by C-G formula based on SCr of 1.01 mg/dL). Recent Labs  Lab 04/19/20 1009 04/19/20 1231 04/19/20 1447 04/19/20 1934 04/20/20 0532  PROCALCITON  --   --   --  >150.00  --   WBC 9.1  --   --   --  8.9  LATICACIDVEN  --  3.8* 2.9* 2.0*  --     Liver Function Tests: Recent Labs  Lab 04/19/20 1009  AST 83*  ALT 65*  ALKPHOS 82  BILITOT 1.2  PROT 5.8*  ALBUMIN 2.7*   Recent Labs  Lab 04/19/20 1009  LIPASE 17   No results for input(s): AMMONIA in the last 168 hours.  ABG No results found for: PHART, PCO2ART, PO2ART, HCO3, TCO2, ACIDBASEDEF, O2SAT   Coagulation Profile: No results for input(s): INR, PROTIME in the last 168 hours.  Cardiac Enzymes: No results for input(s): CKTOTAL, CKMB, CKMBINDEX, TROPONINI in the last 168 hours.  HbA1C: Hgb A1c MFr Bld  Date/Time Value Ref Range Status  11/30/2017 10:40 AM 4.9 <5.7 % of total Hgb Final    Comment:    For the purpose of screening for the presence of diabetes: . <5.7%       Consistent with the absence of diabetes 5.7-6.4%    Consistent with increased risk for diabetes             (prediabetes) > or =6.5%  Consistent with diabetes . This assay result is consistent with a decreased risk of diabetes. . Currently, no consensus exists regarding use of hemoglobin A1c for diagnosis of diabetes in children. . According to American Diabetes Association  (ADA) guidelines, hemoglobin A1c <7.0% represents optimal control in non-pregnant diabetic patients. Different metrics may apply to specific patient populations.  Standards of Medical Care in Diabetes(ADA). Marland Kitchen   10/02/2016 09:52 AM 4.7 <5.7 % Final    Comment:      For the purpose of screening for the presence of diabetes:   <5.7%       Consistent with the absence of diabetes 5.7-6.4 %   Consistent with increased risk for diabetes (prediabetes) >=6.5 %     Consistent with diabetes   This assay result is consistent with a decreased risk of diabetes.   Currently, no consensus exists regarding use of hemoglobin A1c for diagnosis of diabetes in children.   According to American Diabetes Association (ADA) guidelines, hemoglobin A1c <7.0% represents optimal control in non-pregnant diabetic patients. Different  metrics may apply to specific patient populations. Standards of Medical Care in Diabetes (ADA).       CBG: Recent Labs  Lab 04/20/20 0050  GLUCAP 126*    Review of Systems:   Pertinent positives and negatives per HPI.  Otherwise a 12 point ROS was negative  Past Medical History:  He,  has a past medical history of Anxiety, Atrophy, disuse, muscle, BPH (benign prostatic hyperplasia), Cerebral palsy ([place]), Eczema, Elevated lipids, Elevated PSA, Exotropia, Hyperlipidemia, Kidney stone, Leukopenia, Lower extremity edema, Mental retardation, Over weight, Prostate cancer ([place]), Venous insufficiency, Vitamin D deficiency, and Wheelchair dependence.   Surgical History:   Past Surgical History:  Procedure Laterality Date  . CYSTOSCOPY/URETEROSCOPY/HOLMIUM LASER/STENT PLACEMENT Right 08/14/2017   Procedure: CYSTOSCOPY/URETEROSCOPY/HOLMIUM LASER/STENT PLACEMENT;  Surgeon: Abbie Sons, MD;  Location: [facility] ORS;  Service: Urology;  Laterality: Right;  . leg circulation surgery Right   . PROSTATE SURGERY  11/10/2008   BRACHYTHERAPY     Social History:   reports that he has  never smoked. He has never used smokeless tobacco. He reports that he does not drink alcohol and does not use drugs.   Family History:  His family history includes Heart attack in his brother; Heart disease in an other family member.   Allergies No Known Allergies   Home Medications  Prior to Admission medications   Medication Sig Start Date End Date Taking? Authorizing Provider  acetaminophen (TYLENOL) 325 MG tablet TAKE 2 TABLETS (650 MG TOTAL) BY MOUTH EVERY 6 HOURS AS NEEDED FOR MILD PAIN OR HEADACHE. 01/29/20   Steele Sizer, MD  alfuzosin (UROXATRAL) 10 MG 24 hr tablet Take 1 tablet (10 mg total) by mouth daily. 03/01/20   Sowles, Drue Stager, MD  ALPRAZolam Duanne Moron) 0.25 MG tablet TAKE ONE TABLET BY MOUTH 2 TIMES A DAY AS NEEDED FOR ANXIETY (WHEN BLOOD PRESSURE IS VERY HIGH OR ANXIOUS) 06/02/19   Sowles, Drue Stager, MD  ARTIFICIAL TEARS 0.2-0.2-1 % SOLN PLACE 2 DROPS INTO EYE 2 TIMES A DAY AS NEEDED 07/24/19   Ancil Boozer, Drue Stager, MD  ascorbic acid (VITAMIN C) 500 MG tablet TAKE ONE TABLET BY MOUTH ONCE DAILY 03/15/20   Steele Sizer, MD  aspirin (ASPIR-LOW) 81 MG EC tablet TAKE 1 TABLET BY MOUTH ONCE DAILY 11/18/19   Steele Sizer, MD  atorvastatin (LIPITOR) 40 MG tablet Take 1 tablet (40 mg total) by mouth every evening. 03/01/20   Steele Sizer, MD  augmented betamethasone dipropionate (DIPROLENE-AF) 0.05 % cream APPLY TOPICALLY AS DIRECTED TO RASH AS NEEDED 06/17/19   Steele Sizer, MD  Calcium Carb-Cholecalciferol (CALCIUM-VITAMIN D) 500-400 MG-UNIT TABS TAKE 1 TABLET BY MOUTH 2 TIMES A DAY 02/16/20   Sowles, Drue Stager, MD  calcium carbonate (OS-CAL) 1250 (500 Ca) MG chewable tablet Chew by mouth.    [provider]  Cranberry Juice Powder 425 MG CAPS TAKE 1 CAPSULE BY MOUTH ONCE DAILY 11/18/19   Steele Sizer, MD  Elastic Bandages & Supports (MEDICAL COMPRESSION STOCKINGS) MISC 1 Units by Does not apply route daily. 06/02/19   Steele Sizer, MD  Incontinence Supply Disposable (DEPEND  SILHOUETTE BRIEFS L/XL) MISC 1 each by Does not apply route 2 (two) times daily as needed. 06/02/19   Steele Sizer, MD  lubiprostone (AMITIZA) 24 MCG capsule TAKE 1 CAPSULE BY MOUTH 2 TIMES DAILY WITH A MEAL 03/01/20   Sowles, Drue Stager, MD  Nutritional Supplements (ENSURE NUTRITION SHAKE) LIQD Take 1 each by mouth 2 (two) times daily. 06/02/19   Steele Sizer, MD  Polyethyl Glycol-Propyl Glycol (  SYSTANE ULTRA) 0.4-0.3 % SOLN Apply 1 drop to eye 2 (two) times daily. Both eyes 06/02/19   Steele Sizer, MD  sertraline (ZOLOFT) 100 MG tablet Take 1 tablet (100 mg total) by mouth daily. 03/01/20   Steele Sizer, MD     Critical care time: 60 minutes

## 2020-04-20 NOTE — Plan of Care (Addendum)
-   Patient remains on Levophed, weaning as tolerated.  - Off of pressors at 1421 hrs.    Problem: Education: Goal: Knowledge of General Education information will improve Description: Including pain rating scale, medication(s)/side effects and non-pharmacologic comfort measures Outcome: Progressing   Problem: Health Behavior/Discharge Planning: Goal: Ability to manage health-related needs will improve Outcome: Progressing   Problem: Clinical Measurements: Goal: Ability to maintain clinical measurements within normal limits will improve Outcome: Progressing Goal: Will remain free from infection Outcome: Progressing Goal: Diagnostic test results will improve Outcome: Progressing Goal: Respiratory complications will improve Outcome: Progressing Goal: Cardiovascular complication will be avoided Outcome: Progressing   Problem: Activity: Goal: Risk for activity intolerance will decrease Outcome: Progressing   Problem: Nutrition: Goal: Adequate nutrition will be maintained Outcome: Progressing   Problem: Coping: Goal: Level of anxiety will decrease Outcome: Progressing   Problem: Elimination: Goal: Will not experience complications related to bowel motility Outcome: Progressing Goal: Will not experience complications related to urinary retention Outcome: Progressing   Problem: Pain Managment: Goal: General experience of comfort will improve Outcome: Progressing   Problem: Safety: Goal: Ability to remain free from injury will improve Outcome: Progressing   Problem: Skin Integrity: Goal: Risk for impaired skin integrity will decrease Outcome: Progressing

## 2020-04-21 DIAGNOSIS — A419 Sepsis, unspecified organism: Secondary | ICD-10-CM | POA: Diagnosis not present

## 2020-04-21 LAB — URINE CULTURE

## 2020-04-21 LAB — PHOSPHORUS: Phosphorus: 2.2 mg/dL — ABNORMAL LOW (ref 2.5–4.6)

## 2020-04-21 LAB — PROCALCITONIN: Procalcitonin: 49.93 ng/mL

## 2020-04-21 LAB — MAGNESIUM: Magnesium: 1.8 mg/dL (ref 1.7–2.4)

## 2020-04-21 MED ORDER — K PHOS MONO-SOD PHOS DI & MONO 155-852-130 MG PO TABS
250.0000 mg | ORAL_TABLET | Freq: Once | ORAL | Status: AC
  Administered 2020-04-21: 15:00:00 250 mg via ORAL
  Filled 2020-04-21: qty 1

## 2020-04-21 NOTE — Progress Notes (Signed)
PCCM pick up for 04/22/20: 79 year old male with history of cerebral palsy presented with confusion, dyspnea.  Found to be hypotensive on presentation.  Admitted for the management of septic shock under PCCM service.  Needed pressure support which has been weaned off now with volume resuscitation.  Suspected to have UTI/pneumonia.  Found to have elevated procalcitonin.  On zosyn.  Medically stable for transfer to [facility] service.follow up cultures.

## 2020-04-21 NOTE — Progress Notes (Signed)
NAME:  [name], MRN:  [id number], DOB:  1941-01-01, LOS: 2 ADMISSION DATE:  04/19/2020, CONSULTATION DATE:  04/19/20 REFERRING MD:  ED physician, CHIEF COMPLAINT:  AMS and SOB  Brief History:  This is a 79 yo male with history of CP, intellectual disability, prostate cancer, HLD, and BPH who presented with AMS and SOB.    History of Present Illness:  This is a 79 yo male with history of CP, intellectual disability, prostate cancer, HLD, and BPH who presented with AMS and SOB.  Patient states that he became short of breath today.  However on speaking with niece who is the patient's guardian it seems that she was called last night that patient was hypotensive.  They were going to try to use fluids at his living facility to increase the blood pressure.  This is in the context of poor p.o. intake.  However patient was noted to have a fever this morning at his facility.  Niece is unaware as to the actual recording of the temperature.  Outside of this patient has been doing fairly well per the niece.  She notes that patient is able to answer simple questions but is not able to carry on complex conversations at baseline.  Patient appears near his baseline per niece.  Patient does endorse some abdominal tenderness in the suprapubic region.    Interval events: 04-20-2020 -patient with levo weaned to 2.  Overall stable cultures pending 04/21/20-Remains off pressors. Pro calcitonin coming down significantly with initiation of abx.   Past Medical History:  CP BPH HLD Prostate cancer AMS  Significant Hospital Events:  Admitted to ICU on 04/19/20  Consults:  NA  Procedures:  NA  Significant Diagnostic Tests:  Chest x-ray with some increasing opacifications in the basilar regions.  But no overt dense consolidation appreciated.  It is difficult to really delineate the left-sided heart border  Micro Data:  Cultures pending  Antimicrobials:  Cefepime and Vanco for now  Interim  History / Subjective:  NA  Objective   Blood pressure 96/69, pulse (!) 57, temperature 97.9 F (36.6 C), temperature source Oral, resp. rate 16, height 5\' 10"  (1.778 m), weight 61.4 kg, SpO2 99 %.        Intake/Output Summary (Last 24 hours) at 04/21/2020 1429 Last data filed at 04/21/2020 0800 Gross per 24 hour  Intake 595.31 ml  Output 1400 ml  Net -804.69 ml   Filed Weights   04/19/20 1006 04/20/20 0100  Weight: 60 kg 61.4 kg    Examination: General: Patient interactive and alert HENT: Moist mucous membranes Lungs: Intermittent rhonchi in bilateral lower lobes no increased work of breathing Cardiovascular: Regular rate and rhythm Abdomen: Soft nondistended.  Some tenderness in the suprapubic region Extremities: No edema appreciated Neuro: No focal deficits noted patient noted to have baseline contractures.  At baseline patient is bedridden GU: Deferred  Resolved Hospital Problem list   Not applicable  Assessment & Plan:  This is a 79 yo with history as noted above who presents for chief complaint of AMS and SOB.   Shock- Resolved- likely secondary to UTI versus pneumonia.  Given history of recurrent UTIs would favor UTI.  Last urine culture grew Enterococcus faecalis that was sensitive to ampicillin nitrofurantoin and vancomycin -Broad-spectrum antibiotics with zosyn would treat pneumonia for at least 5 days and given shock would probably treat with 7. Can transition to orals once Urine cultures come back.   Acute kidney injury-Baseline creatinine around 0.57 creatinine today  at 1.58 -Avoid nephrotoxins -Fluid as needed as patient with history of decreased p.o. intake and likely a prerenal state from inadequate perfusion  Depression -Sertraline   Anxiety -Xanax prn    Best practice (evaluated daily)  Diet: Pureed  Pain/Anxiety/Delirium protocol (if indicated): NA VAP protocol (if indicated): NA DVT prophylaxis: lovenox for now GI prophylaxis: NA Glucose  control: Not indicated for now Mobility: Bed bound Disposition:ICU  Goals of Care:   Code Status: Full code niece at bedside who is his guardian. She confirmed patient was full code  Labs   CBC: Recent Labs  Lab 04/19/20 1009 04/20/20 0532  WBC 9.1 8.9  NEUTROABS 7.5  --   HGB 9.6* 9.7*  HCT 29.0* 27.9*  MCV 99.3 97.6  PLT 79* 76*    Basic Metabolic Panel: Recent Labs  Lab 04/19/20 1009 04/20/20 0532 04/21/20 0327  NA 137 141  --   K 3.9 3.3*  --   CL 106 111  --   CO2 22 21*  --   GLUCOSE 128* 88  --   BUN 59* 61*  --   CREATININE 1.58* 1.01  --   CALCIUM 8.5* 8.4*  --   MG  --   --  1.8  PHOS  --   --  2.2*   GFR: Estimated Creatinine Clearance: 53.2 mL/min (by C-G formula based on SCr of 1.01 mg/dL). Recent Labs  Lab 04/19/20 1009 04/19/20 1231 04/19/20 1447 04/19/20 1934 04/20/20 0532 04/21/20 0327  PROCALCITON  --   --   --  >150.00  --  49.93  WBC 9.1  --   --   --  8.9  --   LATICACIDVEN  --  3.8* 2.9* 2.0*  --   --     Liver Function Tests: Recent Labs  Lab 04/19/20 1009  AST 83*  ALT 65*  ALKPHOS 82  BILITOT 1.2  PROT 5.8*  ALBUMIN 2.7*   Recent Labs  Lab 04/19/20 1009  LIPASE 17   No results for input(s): AMMONIA in the last 168 hours.  ABG No results found for: PHART, PCO2ART, PO2ART, HCO3, TCO2, ACIDBASEDEF, O2SAT   Coagulation Profile: No results for input(s): INR, PROTIME in the last 168 hours.  Cardiac Enzymes: No results for input(s): CKTOTAL, CKMB, CKMBINDEX, TROPONINI in the last 168 hours.  HbA1C: Hgb A1c MFr Bld  Date/Time Value Ref Range Status  11/30/2017 10:40 AM 4.9 <5.7 % of total Hgb Final    Comment:    For the purpose of screening for the presence of diabetes: . <5.7%       Consistent with the absence of diabetes 5.7-6.4%    Consistent with increased risk for diabetes             (prediabetes) > or =6.5%  Consistent with diabetes . This assay result is consistent with a decreased risk of  diabetes. . Currently, no consensus exists regarding use of hemoglobin A1c for diagnosis of diabetes in children. . According to American Diabetes Association (ADA) guidelines, hemoglobin A1c <7.0% represents optimal control in non-pregnant diabetic patients. Different metrics may apply to specific patient populations.  Standards of Medical Care in Diabetes(ADA). Marland Kitchen   10/02/2016 09:52 AM 4.7 <5.7 % Final    Comment:      For the purpose of screening for the presence of diabetes:   <5.7%       Consistent with the absence of diabetes 5.7-6.4 %   Consistent with increased risk for diabetes (prediabetes) >=  6.5 %     Consistent with diabetes   This assay result is consistent with a decreased risk of diabetes.   Currently, no consensus exists regarding use of hemoglobin A1c for diagnosis of diabetes in children.   According to American Diabetes Association (ADA) guidelines, hemoglobin A1c <7.0% represents optimal control in non-pregnant diabetic patients. Different metrics may apply to specific patient populations. Standards of Medical Care in Diabetes (ADA).       CBG: Recent Labs  Lab 04/20/20 0050  GLUCAP 126*    Review of Systems:   Pertinent positives and negatives per HPI.  Otherwise a 12 point ROS was negative  Past Medical History:  He,  has a past medical history of Anxiety, Atrophy, disuse, muscle, BPH (benign prostatic hyperplasia), Cerebral palsy ([place]), Eczema, Elevated lipids, Elevated PSA, Exotropia, Hyperlipidemia, Kidney stone, Leukopenia, Lower extremity edema, Mental retardation, Over weight, Prostate cancer ([place]), Venous insufficiency, Vitamin D deficiency, and Wheelchair dependence.   Surgical History:   Past Surgical History:  Procedure Laterality Date  . CYSTOSCOPY/URETEROSCOPY/HOLMIUM LASER/STENT PLACEMENT Right 08/14/2017   Procedure: CYSTOSCOPY/URETEROSCOPY/HOLMIUM LASER/STENT PLACEMENT;  Surgeon: Abbie Sons, MD;  Location: [facility] ORS;   Service: Urology;  Laterality: Right;  . leg circulation surgery Right   . PROSTATE SURGERY  11/10/2008   BRACHYTHERAPY     Social History:   reports that he has never smoked. He has never used smokeless tobacco. He reports that he does not drink alcohol and does not use drugs.   Family History:  His family history includes Heart attack in his brother; Heart disease in an other family member.   Allergies No Known Allergies   Home Medications  Prior to Admission medications   Medication Sig Start Date End Date Taking? Authorizing Provider  acetaminophen (TYLENOL) 325 MG tablet TAKE 2 TABLETS (650 MG TOTAL) BY MOUTH EVERY 6 HOURS AS NEEDED FOR MILD PAIN OR HEADACHE. 01/29/20   Steele Sizer, MD  alfuzosin (UROXATRAL) 10 MG 24 hr tablet Take 1 tablet (10 mg total) by mouth daily. 03/01/20   Sowles, Drue Stager, MD  ALPRAZolam Duanne Moron) 0.25 MG tablet TAKE ONE TABLET BY MOUTH 2 TIMES A DAY AS NEEDED FOR ANXIETY (WHEN BLOOD PRESSURE IS VERY HIGH OR ANXIOUS) 06/02/19   Sowles, Drue Stager, MD  ARTIFICIAL TEARS 0.2-0.2-1 % SOLN PLACE 2 DROPS INTO EYE 2 TIMES A DAY AS NEEDED 07/24/19   Ancil Boozer, Drue Stager, MD  ascorbic acid (VITAMIN C) 500 MG tablet TAKE ONE TABLET BY MOUTH ONCE DAILY 03/15/20   Steele Sizer, MD  aspirin (ASPIR-LOW) 81 MG EC tablet TAKE 1 TABLET BY MOUTH ONCE DAILY 11/18/19   Steele Sizer, MD  atorvastatin (LIPITOR) 40 MG tablet Take 1 tablet (40 mg total) by mouth every evening. 03/01/20   Steele Sizer, MD  augmented betamethasone dipropionate (DIPROLENE-AF) 0.05 % cream APPLY TOPICALLY AS DIRECTED TO RASH AS NEEDED 06/17/19   Steele Sizer, MD  Calcium Carb-Cholecalciferol (CALCIUM-VITAMIN D) 500-400 MG-UNIT TABS TAKE 1 TABLET BY MOUTH 2 TIMES A DAY 02/16/20   Sowles, Drue Stager, MD  calcium carbonate (OS-CAL) 1250 (500 Ca) MG chewable tablet Chew by mouth.    [provider]  Cranberry Juice Powder 425 MG CAPS TAKE 1 CAPSULE BY MOUTH ONCE DAILY 11/18/19   Steele Sizer, MD   Elastic Bandages & Supports (MEDICAL COMPRESSION STOCKINGS) MISC 1 Units by Does not apply route daily. 06/02/19   Steele Sizer, MD  Incontinence Supply Disposable (DEPEND SILHOUETTE BRIEFS L/XL) MISC 1 each by Does not apply route 2 (two)  times daily as needed. 06/02/19   Alba Cory, MD  lubiprostone (AMITIZA) 24 MCG capsule TAKE 1 CAPSULE BY MOUTH 2 TIMES DAILY WITH A MEAL 03/01/20   Sowles, Danna Hefty, MD  Nutritional Supplements (ENSURE NUTRITION SHAKE) LIQD Take 1 each by mouth 2 (two) times daily. 06/02/19   Alba Cory, MD  Polyethyl Glycol-Propyl Glycol (SYSTANE ULTRA) 0.4-0.3 % SOLN Apply 1 drop to eye 2 (two) times daily. Both eyes 06/02/19   Alba Cory, MD  sertraline (ZOLOFT) 100 MG tablet Take 1 tablet (100 mg total) by mouth daily. 03/01/20   Alba Cory, MD     Critical care time: 35 minutes

## 2020-04-22 DIAGNOSIS — E43 Unspecified severe protein-calorie malnutrition: Secondary | ICD-10-CM | POA: Insufficient documentation

## 2020-04-22 DIAGNOSIS — L8990 Pressure ulcer of unspecified site, unspecified stage: Secondary | ICD-10-CM | POA: Insufficient documentation

## 2020-04-22 DIAGNOSIS — A419 Sepsis, unspecified organism: Secondary | ICD-10-CM | POA: Diagnosis not present

## 2020-04-22 DIAGNOSIS — J189 Pneumonia, unspecified organism: Secondary | ICD-10-CM | POA: Diagnosis not present

## 2020-04-22 DIAGNOSIS — G800 Spastic quadriplegic cerebral palsy: Secondary | ICD-10-CM | POA: Diagnosis not present

## 2020-04-22 LAB — PROCALCITONIN: Procalcitonin: 25.56 ng/mL

## 2020-04-22 NOTE — Evaluation (Signed)
Physical Therapy Evaluation Patient Details Name: [name] MRN: [id number] DOB: 04/28/1940 Today's Date: 04/22/2020   History of Present Illness  [name] is a 79 yo male with history of CP, intellectual disability, prostate cancer, HLD, and BPH who presented with AMS and SOB.  Patient states that he became short of breath today.  However on speaking with niece who is the patient's guardian it seems that she was called last night that patient was hypotensive.  They were going to try to use fluids at his living facility to increase the blood pressure.  This is in the context of poor p.o. intake.  However patient was noted to have a fever this morning at his facility.  Niece is unaware as to the actual recording of the temperature.  Outside of this patient has been doing fairly well per the niece.  She notes that patient is able to answer simple questions but is not able to carry on complex conversations at baseline.  Patient appears near his baseline per niece.  Patient does endorse some abdominal tenderness in the suprapubic region.     Clinical Impression  Pt received in Semi-Fowler's position and agreeable to therapy.  Pt is difficult to understand and communicate with due to cognitive impairments.  Pt also unable to perform bed-level exercises without assistance.  Therapist performed PROM to increase joint mobility and to increase tissue extensibility of musculature related to the LEs.  Pt would continue to benefit from skilled therapy to improve LE ROM and prevent further contractures from occurring.    Spoke with legal guardian Mickie Kay on the phone about pt's level of cognition and mobility.  Niece notes that the pt has not been mobile for over a year and is no longer able to perform transfers to the wheelchair himself.  Pt requires assistance to be moved into wheelchair and can no longer propel the wheelchair either.  Niece notes that he received HHPT at the group home at  one point in time and it was beneficial for the pt and would like for that to occur again if possible.  Pt will benefit from skilled PT intervention to increase independence and safety with basic mobility in preparation for discharge to the venue listed below.       Follow Up Recommendations Home health PT; d/c plan is to go back to [facility].    Equipment Recommendations       Recommendations for Other Services       Precautions / Restrictions Precautions Precautions: None Restrictions Weight Bearing Restrictions: No      Mobility  Bed Mobility               General bed mobility comments: declined due to pt unable to respond to commands currently.    Transfers                    Ambulation/Gait                Stairs            Wheelchair Mobility    Modified Rankin (Stroke Patients Only)       Balance                                             Pertinent Vitals/Pain Pain Assessment: No/denies pain    Home  Living Family/patient expects to be discharged to:: Group home                 Additional Comments: Spoke with niece and sister on the phone about pt's baseline.  Pt is resident at [facility], and has not been able to ambulate for over a year.  Pt also unable to transfer to a wheelchair as well.  Pt requires assistance for all mobility.    Prior Function Level of Independence: Needs assistance   Gait / Transfers Assistance Needed: Unable to perfrom gait or transfers without maxA.           Hand Dominance        Extremity/Trunk Assessment   Upper Extremity Assessment Upper Extremity Assessment: Generalized weakness;Difficult to assess due to impaired cognition    Lower Extremity Assessment Lower Extremity Assessment: Generalized weakness;Difficult to assess due to impaired cognition       Communication   Communication: Other (comment) (Pt has communication  difficulties at baseline and is only able to respond to basic level questions.)  Cognition     Overall Cognitive Status: History of cognitive impairments - at baseline                                        General Comments      Exercises Total Joint Exercises Ankle Circles/Pumps: PROM;Both;10 reps;Supine Quad Sets: PROM;Both;10 reps;Supine Heel Slides: PROM;Both;10 reps;Supine Hip ABduction/ADduction: PROM;Both;10 reps;Supine Straight Leg Raises: PROM;Both;10 reps;Supine Other Exercises Other Exercises: Communicated with pt about services PT provided and spoke with niece and sister about cognitive and baseline levels prior to hospital admission.   Assessment/Plan    PT Assessment Patient needs continued PT services  PT Problem List Decreased strength;Decreased range of motion;Decreased activity tolerance;Decreased mobility;Decreased coordination;Decreased cognition       PT Treatment Interventions Therapeutic activities;Therapeutic exercise;Cognitive remediation    PT Goals (Current goals can be found in the Care Plan section)  Acute Rehab PT Goals PT Goal Formulation: Patient unable to participate in goal setting (unable to assess due to cognitive impairments.) Time For Goal Achievement: 05/06/20    Frequency Min 2X/week   Barriers to discharge   Expectation is for pt to be d/c back to [facility].    Co-evaluation               AM-PAC PT "6 Clicks" Mobility  Outcome Measure Help needed turning from your back to your side while in a flat bed without using bedrails?: Total Help needed moving from lying on your back to sitting on the side of a flat bed without using bedrails?: Total Help needed moving to and from a bed to a chair (including a wheelchair)?: Total Help needed standing up from a chair using your arms (e.g., wheelchair or bedside chair)?: Total Help needed to walk in hospital room?: Total Help needed climbing  3-5 steps with a railing? : Total 6 Click Score: 6    End of Session     Patient left: in bed;with call bell/phone within reach;with bed alarm set   PT Visit Diagnosis: Unsteadiness on feet (R26.81);Muscle weakness (generalized) (M62.81)    Time: 2878-6767 PT Time Calculation (min) (ACUTE ONLY): 18 min   Charges:   PT Evaluation $PT Eval Low Complexity: 1 Low PT Treatments $Therapeutic Activity: 8-22 mins        Nolon Bussing,  PT, DPT 04/22/20, 3:52 PM

## 2020-04-22 NOTE — Progress Notes (Signed)
[place] [facility] [place] NAME: [name]    MR#:  [id number]  DATE OF BIRTH:  Mar 23, 1941  SUBJECTIVE:   Pt not able to communicate much. Did understand somethings said by him No fever Eats well per RN BP soft Pt bedbound ast baseline REVIEW OF SYSTEMS:   Review of Systems  Unable to perform ROS: Mental acuity  MR,CP Tolerating Diet:yes Tolerating PT: bedbound  DRUG ALLERGIES:  No Known Allergies  VITALS:  Blood pressure 97/63, pulse (!) 55, temperature (!) 97.5 F (36.4 C), temperature source Oral, resp. rate 20, height 5\' 10"  (1.778 m), weight 61.4 kg, SpO2 98 %.  PHYSICAL EXAMINATION:   Physical Exam  GENERAL:  79 y.o.-year-old patient lying in the bed with no acute distress. thin HEENT: Head atraumatic, normocephalic. Oropharynx and nasopharynx clear.  edentulous.  LUNGS: Normal breath sounds bilaterally, no wheezing, rales, rhonchi. No use of accessory muscles of respiration.  CARDIOVASCULAR: S1, S2 normal. No murmurs, rubs, or gallops.  ABDOMEN: Soft, nontender, nondistended. Bowel sounds present. No organomegaly or mass.  EXTREMITIES: contractures NEUROLOGIC:Moves all extremities well PSYCHIATRIC:  patient is alert and awake SKIN: No obvious rash, lesion, or ulcer. Pressure Injury 04/20/20 Sacrum Stage 2 -  Partial thickness loss of dermis presenting as a shallow open injury with a red, pink wound bed without slough. (Active)  04/20/20 0200  Location: Sacrum  Location Orientation:   Staging: Stage 2 -  Partial thickness loss of dermis presenting as a shallow open injury with a red, pink wound bed without slough.  Wound Description (Comments):   Present on Admission: Yes         LABORATORY PANEL:  CBC Recent Labs  Lab 04/20/20 0532  WBC 8.9  HGB 9.7*  HCT 27.9*  PLT 76*    Chemistries  Recent Labs  Lab 04/19/20 1009 04/20/20 0532 04/21/20 0327  NA 137 141  --   K 3.9 3.3*  --   CL 106 111  --    CO2 22 21*  --   GLUCOSE 128* 88  --   BUN 59* 61*  --   CREATININE 1.58* 1.01  --   CALCIUM 8.5* 8.4*  --   MG  --   --  1.8  AST 83*  --   --   ALT 65*  --   --   ALKPHOS 82  --   --   BILITOT 1.2  --   --    Cardiac Enzymes No results for input(s): TROPONINI in the last 168 hours. RADIOLOGY:  No results found. ASSESSMENT AND PLAN:  [name] is a 79 y.o. male with medical history significant of cerebral palsy with intellectual disability; prostate CA; lymphedema; HLD; and BPH presenting with AMS, SOB.   Shock,  septic --now improved -pt moved out of ICU 12/29 - sepsis Patient has evidence of acute organ failure with elevated lactate >2; recurrent hypotension (SBP < 90 or MAP < 65 x 2 readings); platelets <100 that is requiring to be on IV pressors -BC neg -UC multiple species -CXR infiltrate vs atelectasis--given elevated procalcitonin, SOB, treating for Pneumonia with IV zosyn for 5 days--switch to oral in am -procalcitonin and lactate trending down -pt afebrile -eating well -soft BP  CP/Intellectual Disability/functional quadriplegia/ -This patient has a baseline chronic medical condition that requires total care by caregivers  -The patient has demonstrated inability to feed/groom herself, is nonambulatory, is incontinent, has contractures -His functional quadriplegia places  him at higher risks for complications from acute illness and requires greater resources for his care -Continue Xanax, Zoloft  HLD -Continue Lipitor  H/o prostate CA -Listed on PMH  Thrombocytopenia suspect Sepsis related plt count 77-76K  Pressure Injury 04/20/20 Sacrum Stage 2 -  Partial thickness loss of dermis presenting as a shallow open injury with a red, pink wound bed without slough. (Active)  04/20/20 0200  Location: Sacrum  Location Orientation:   Staging: Stage 2 -  Partial thickness loss of dermis presenting as a shallow open injury with a red, pink wound bed without  slough.  Wound Description (Comments):   Present on Admission: Yes   Nutrition Status: Nutrition Problem: Severe Malnutrition Etiology: chronic illness (prostate cancer, cerebral palsy, quadriplegia) Signs/Symptoms: severe fat depletion,severe muscle depletion    pt overall improving   This patient has been tested and is negative for the novel coronavirus COVID-19. He has been fully vaccinated against COVID-19    Procedures: Family communication :spoke with niece POA [name] Consults : CODE STATUS: FULL per niece DVT Prophylaxis :heparin  Status is: Inpatient     Dispo: The patient is from: [facility]              Anticipated d/c is [facility]              Anticipated d/c date is: likely 12/31              Patient currently medically improving. Will monitor another 24 hours  Niece [name] in agreement      TOTAL TIME TAKING CARE OF THIS PATIENT: *25* minutes.  >50% time spent on counselling and coordination of care  Note: This dictation was prepared with Dragon dictation along with smaller phrase technology. Any transcriptional errors that result from this process are unintentional.  [name] M.D    [facility]   CC: Primary care physician; Alba Cory, MDPatient ID: [name], male   DOB: 1940-05-15, 79 y.o.   MRN: [id number]

## 2020-04-22 NOTE — Progress Notes (Signed)
Patient ID: [name], male   DOB: 02/28/1941, 79 y.o.   MRN: [id number]  [name] [phone]

## 2020-04-22 NOTE — Care Management Important Message (Signed)
Important Message  Patient Details  Name: [name] MRN: [id number] Date of Birth: 21-Apr-1941   Medicare Important Message Given:  Yes     Johnell Comings 04/22/2020, 12:45 PM

## 2020-04-23 DIAGNOSIS — F79 Unspecified intellectual disabilities: Secondary | ICD-10-CM | POA: Diagnosis not present

## 2020-04-23 DIAGNOSIS — G800 Spastic quadriplegic cerebral palsy: Secondary | ICD-10-CM | POA: Diagnosis not present

## 2020-04-23 DIAGNOSIS — J189 Pneumonia, unspecified organism: Secondary | ICD-10-CM | POA: Diagnosis not present

## 2020-04-23 DIAGNOSIS — A419 Sepsis, unspecified organism: Secondary | ICD-10-CM | POA: Diagnosis not present

## 2020-04-23 LAB — CBC
HCT: 28.2 % — ABNORMAL LOW (ref 39.0–52.0)
Hemoglobin: 9.1 g/dL — ABNORMAL LOW (ref 13.0–17.0)
MCH: 32.4 pg (ref 26.0–34.0)
MCHC: 32.3 g/dL (ref 30.0–36.0)
MCV: 100.4 fL — ABNORMAL HIGH (ref 80.0–100.0)
Platelets: 69 10*3/uL — ABNORMAL LOW (ref 150–400)
RBC: 2.81 MIL/uL — ABNORMAL LOW (ref 4.22–5.81)
RDW: 13.3 % (ref 11.5–15.5)
WBC: 4.5 10*3/uL (ref 4.0–10.5)
nRBC: 0 % (ref 0.0–0.2)

## 2020-04-23 LAB — RESP PANEL BY RT-PCR (FLU A&B, COVID) ARPGX2
Influenza A by PCR: NEGATIVE
Influenza B by PCR: NEGATIVE
SARS Coronavirus 2 by RT PCR: NEGATIVE

## 2020-04-23 MED ORDER — AMOXICILLIN-POT CLAVULANATE 875-125 MG PO TABS: 1.0000 | ORAL_TABLET | Freq: Two times a day (BID) | ORAL | 0 refills | Status: AC

## 2020-04-23 MED ORDER — AMOXICILLIN-POT CLAVULANATE 875-125 MG PO TABS: 1.0000 | ORAL_TABLET | Freq: Two times a day (BID) | ORAL | Status: DC

## 2020-04-23 NOTE — Discharge Instructions (Signed)
Pureed diet

## 2020-04-23 NOTE — NC FL2 (Signed)
[place] LEVEL OF CARE SCREENING TOOL     IDENTIFICATION  Patient Name: [name] Birthdate: December 01, 1940 Sex: male Admission Date (Current Location): 04/19/2020  [place] and [place] Number:  Engineering geologist and Address:  [facility], [address], [place], [place] [address]      Provider Number: [id number]  Attending Physician Name and Address:  Fritzi Mandes, MD  Relative Name and Phone Number:       Current Level of Care: Hospital Recommended Level of Care: [facility] Prior Approval Number:    Date Approved/Denied:   PASRR Number:    Discharge Plan:  (Currently lives at group home)    Current Diagnoses: Patient Active Problem List   Diagnosis Date Noted  . Protein-calorie malnutrition, severe 04/22/2020  . Pressure injury of skin 04/22/2020  . Multifocal pneumonia   . Sepsis due to undetermined organism ([place]) 04/19/2020  . Personal history of urinary calculi 06/21/2019  . PAD (peripheral artery disease) ([place]) 07/04/2017  . Aortic valve calcification 07/04/2017  . Coronary artery calcification 07/04/2017  . Late effect acute polio 05/17/2016  . Chronic constipation 11/19/2015  . Mild major depression ([place]) 11/19/2015  . Bacteriuria, chronic 09/11/2015  . History of prostate cancer 03/02/2015  . Amyotrophia 03/02/2015  . Edema leg 03/02/2015  . Cerebral palsy ([place]) 03/02/2015  . Dyslipidemia 03/02/2015  . Dermatitis, eczematoid 03/02/2015  . 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 H/O acute poliomyelitis 03/02/2015  . Lymphedema 03/02/2015  . Intellectual disability 03/02/2015  . Hypertrophy of nail 03/02/2015  . Chronic venous insufficiency 03/02/2015  . Avitaminosis D 03/02/2015  . Adynamia 03/02/2015  . Dependent on wheelchair 03/02/2015  . Renal cysts, acquired, bilateral 03/02/2015  . At risk for falling 03/02/2015  . Cerebral vascular disease 03/02/2015  . Strabismus 02/11/2015  . Urge incontinence  of urine 02/11/2015  . Wheelchair dependence 02/11/2015  . Urinary frequency 10/30/2014    Orientation RESPIRATION BLADDER Height & Weight          Incontinent Weight: 135 lb 5.8 oz (61.4 kg) Height:  5\' 10"  (177.8 cm)  BEHAVIORAL SYMPTOMS/MOOD NEUROLOGICAL BOWEL NUTRITION STATUS      Incontinent    AMBULATORY STATUS COMMUNICATION OF NEEDS Skin   Limited Assist Verbally                         Personal Care Assistance Level of Assistance  Bathing,Feeding,Dressing,Total care Bathing Assistance: Limited assistance Feeding assistance: Independent Dressing Assistance: Independent Total Care Assistance: Limited assistance   Functional Limitations Info             SPECIAL CARE FACTORS FREQUENCY                       Contractures      Additional Factors Info                  Current Medications (04/23/2020):  This is the current hospital active medication list Current Facility-Administered Medications  Medication Dose Route Frequency Provider Last Rate Last Admin  . 0.9 %  sodium chloride infusion  250 mL Intravenous Continuous Carrie Mew, MD 10 mL/hr at 04/22/20 1527 250 mL at 04/22/20 1527  . acetaminophen (TYLENOL) tablet 650 mg  650 mg Oral Q6H PRN Karmen Bongo, MD       Or  . acetaminophen (TYLENOL) suppository 650 mg  650 mg Rectal Q6H PRN Karmen Bongo, MD      .  alfuzosin (UROXATRAL) 24 hr tablet 10 mg  10 mg Oral Daily Jolyn Nap, MD   10 mg at 04/23/20 1027  . ALPRAZolam Prudy Feeler) tablet 0.25 mg  0.25 mg Oral BID PRN Jonah Blue, MD      . Melene Muller ON 04/24/2020] amoxicillin-clavulanate (AUGMENTIN) 875-125 MG per tablet 1 tablet  1 tablet Oral Q12H Enedina Finner, MD      . ascorbic acid (VITAMIN C) tablet 250 mg  250 mg Oral BID Jolyn Nap, MD   250 mg at 04/23/20 1027  . aspirin chewable tablet 81 mg  81 mg Oral Daily Jolyn Nap, MD   81 mg at 04/23/20 1027  . atorvastatin (LIPITOR) tablet 40 mg  40 mg Oral QPM  Jolyn Nap, MD   40 mg at 04/22/20 1914  . feeding supplement (ENSURE ENLIVE / ENSURE PLUS) liquid 237 mL  237 mL Oral TID BM Jolyn Nap, MD   237 mL at 04/22/20 1526  . lubiprostone (AMITIZA) capsule 24 mcg  24 mcg Oral BID WC Jolyn Nap, MD   24 mcg at 04/23/20 1027  . multivitamin with minerals tablet 1 tablet  1 tablet Oral Daily Jolyn Nap, MD   1 tablet at 04/23/20 1027  . ondansetron (ZOFRAN) tablet 4 mg  4 mg Oral Q6H PRN Jonah Blue, MD       Or  . ondansetron [facility]) injection 4 mg  4 mg Intravenous Q6H PRN Jonah Blue, MD      . piperacillin-tazobactam (ZOSYN) IVPB 3.375 g  3.375 g Intravenous Q8H Lowella Bandy, RPH 12.5 mL/hr at 04/23/20 0631 3.375 g at 04/23/20 0631  . polyvinyl alcohol (LIQUIFILM TEARS) 1.4 % ophthalmic solution 2 drop  2 drop Both Eyes PRN Jonah Blue, MD      . sertraline (ZOLOFT) tablet 100 mg  100 mg Oral Daily Jolyn Nap, MD   100 mg at 04/23/20 1027  . sodium chloride flush (NS) 0.9 % injection 3 mL  3 mL Intravenous Q12H Jonah Blue, MD   3 mL at 04/23/20 1030     Discharge Medications: Please see discharge summary for a list of discharge medications.  Relevant Imaging Results:  Relevant Lab Results:   Additional Information [phone]  Eilleen Kempf, LCSW

## 2020-04-23 NOTE — Discharge Summary (Signed)
[facility] [place] NAME: [name]    MR#:  [id number]  DATE OF BIRTH:  07/17/1940  DATE OF ADMISSION:  04/19/2020 ADMITTING PHYSICIAN: No admitting provider for patient encounter.  DATE OF DISCHARGE:   PRIMARY CARE PHYSICIAN: Steele Sizer, MD    ADMISSION DIAGNOSIS:  Septic shock ([place]) [A41.9, R65.21] Sepsis due to pneumonia ([place]) [J18.9, A41.9] Multifocal pneumonia [J18.9]  DISCHARGE DIAGNOSIS:  Septic shock due to Pneumonia/UTI  SECONDARY DIAGNOSIS:   Past Medical History:  Diagnosis Date  . Anxiety   . Atrophy, disuse, muscle   . BPH (benign prostatic hyperplasia)   . Cerebral palsy ([place])   . Eczema   . Elevated lipids   . Elevated PSA   . Exotropia   . Hyperlipidemia   . Kidney stone   . Leukopenia   . Lower extremity edema    lymphedema  . Mental retardation   . Over weight   . Prostate cancer ([place])   . Venous insufficiency   . Vitamin D deficiency   . Wheelchair dependence     HOSPITAL COURSE:  [name] a 79 y.o.malewith medical history significant ofcerebral palsy with intellectual disability; prostate CA; lymphedema; HLD; and BPH presenting with AMS, SOB.  Shock,  septic --now improved -pt moved out of ICU 12/29 - sepsis Patient has evidence of acute organ failure with elevated lactate >2; recurrent hypotension (SBP < 90 or MAP < 65 x 2 readings); platelets <100 that is requiring to be on IV pressors with Levophed--now weaned off -BC neg -UC multiple species -CXR infiltrate vs atelectasis--given elevated procalcitonin, SOB, treating for Pneumonia with IV zosyn -switch to oral augmentin at discharge -procalcitonin and lactate trending down -pt afebrile -eating well -soft BP --12/31--awake and pleasant. Cannot much communicate (baseline). Per RN eats well his pureed diet. No fever  CP/Intellectual Disability/functional quadriplegia/ - patient has a baseline chronic medical  condition that requires total care by caregivers  -Continue Xanax, Zoloft  HLD -Continue Lipitor  H/o prostate CA -Listed on PMH  Thrombocytopenia with h/o Leukopenia -suspect Sepsis related -plt count 77-76K -D/ced heparin -Pt has seen Dr Janese Banks for above and will have him f/u in few days to ensure counts are stable/improving--this was relayed to [facility] 04/20/20 Sacrum Stage 2 -  Partial thickness loss of dermis presenting as a shallow open injury with a red, pink wound bed without slough. (Active)  04/20/20 0200  Location: Sacrum  Location Orientation:   Staging: Stage 2 -  Partial thickness loss of dermis presenting as a shallow open injury with a red, pink wound bed without slough.  Wound Description (Comments):   Present on Admission: Yes   Nutrition Status: Nutrition Problem: Severe Malnutrition Etiology: chronic illness (prostate cancer, cerebral palsy, quadriplegia) Signs/Symptoms: severe fat depletion,severe muscle depletion  pt overall improving  patient has been tested and is negative for the novel coronavirus COVID-19. Hehas been fully vaccinated against COVID-19    Family communication :spoke with niece POA [name] today Consults : CODE STATUS: FULL per niece DVT Prophylaxis :SCD  Status is: Inpatient     Dispo: The patient is from: [facility] group home  Anticipated d/c is [id number] home  Anticipated d/c date is: 12/31  Patient currently medically appears best at baseline D/c to group home Repeat COVID ordered [facility] Katharine Look aware  Niece [name] in agreement with D/c    CONSULTS OBTAINED:    DRUG ALLERGIES:  No Known Allergies  DISCHARGE MEDICATIONS:   Allergies as of 04/23/2020   No Known Allergies     Medication List    TAKE these medications   acetaminophen 325 MG tablet Commonly known as: TYLENOL TAKE 2 TABLETS (650 MG TOTAL) BY MOUTH EVERY 6 HOURS AS NEEDED FOR  MILD PAIN OR HEADACHE.   alfuzosin 10 MG 24 hr tablet Commonly known as: UROXATRAL Take 1 tablet (10 mg total) by mouth daily.   ALPRAZolam 0.25 MG tablet Commonly known as: XANAX TAKE ONE TABLET BY MOUTH 2 TIMES A DAY AS NEEDED FOR ANXIETY (WHEN BLOOD PRESSURE IS VERY HIGH OR ANXIOUS) What changed:  how much to take how to take this when to take this reasons to take this   amoxicillin-clavulanate 875-125 MG tablet Commonly known as: AUGMENTIN Take 1 tablet by mouth every 12 (twelve) hours for 3 days. Start taking on: April 24, 2020   Artificial Tears 0.2-0.2-1 % Soln Generic drug: Glycerin-Hypromellose-PEG 400 PLACE 2 DROPS INTO EYE 2 TIMES A DAY AS NEEDED   ascorbic acid 500 MG tablet Commonly known as: VITAMIN C TAKE ONE TABLET BY MOUTH ONCE DAILY   aspirin 81 MG EC tablet Commonly known as: Aspir-Low TAKE 1 TABLET BY MOUTH ONCE DAILY What changed:  how much to take how to take this when to take this   atorvastatin 40 MG tablet Commonly known as: LIPITOR Take 1 tablet (40 mg total) by mouth every evening.   augmented betamethasone dipropionate 0.05 % cream Commonly known as: DIPROLENE-AF APPLY TOPICALLY AS DIRECTED TO RASH AS NEEDED   Calcium-Vitamin D 500-400 MG-UNIT Tabs TAKE 1 TABLET BY MOUTH 2 TIMES A DAY   Cranberry Juice Powder 425 MG Caps TAKE 1 CAPSULE BY MOUTH ONCE DAILY   Depend Silhouette Briefs L/XL Misc 1 each by Does not apply route 2 (two) times daily as needed.   Ensure Nutrition Shake Liqd Take 1 each by mouth 2 (two) times daily.   lubiprostone 24 MCG capsule Commonly known as: AMITIZA TAKE 1 CAPSULE BY MOUTH 2 TIMES DAILY WITH A MEAL What changed:  how much to take how to take this when to take this   Medical Compression Stockings Misc 1 Units by Does not apply route daily.   sertraline 100 MG tablet Commonly known as: ZOLOFT Take 1 tablet (100 mg total) by mouth daily.   Systane Ultra 0.4-0.3 % Soln Generic drug:  Polyethyl Glycol-Propyl Glycol Apply 1 drop to eye 2 (two) times daily. Both eyes            Discharge Care Instructions  (From admission, onward)         Start     Ordered   04/23/20 0000  Discharge wound care:       Comments: Foam pad on the sacral ulcer   04/23/20 [id number]          If you experience worsening of your admission symptoms, develop shortness of breath, life threatening emergency, suicidal or homicidal thoughts you must seek medical attention immediately by calling 911 or calling your MD immediately  if symptoms less severe.  You Must read complete instructions/literature along with all the possible adverse reactions/side effects for all the Medicines you take and that have been prescribed to you. Take any new Medicines after you have completely understood and accept all the possible adverse reactions/side effects.   Please note  You were cared for by a hospitalist during your hospital stay. If you have any questions about your discharge medications or  the care you received while you were in the hospital after you are discharged, you can call the unit and asked to speak with the hospitalist on call if the hospitalist that took care of you is not available. Once you are discharged, your primary care physician will handle any further medical issues. Please note that NO REFILLS for any discharge medications will be authorized once you are discharged, as it is imperative that you return to your primary care physician (or establish a relationship with a primary care physician if you do not have one) for your aftercare needs so that they can reassess your need for medications and monitor your lab values. Today   SUBJECTIVE    Awake and alert. No new issues per RN VITAL SIGNS:  Blood pressure 118/73, pulse (!) 50, temperature (!) 97.5 F (36.4 C), temperature source Oral, resp. rate 20, height 5\' 10"  (1.778 m), weight 61.4 kg, SpO2 98 %.  I/O:    Intake/Output Summary  (Last 24 hours) at 04/23/2020 0942 Last data filed at 04/23/2020 0433 Gross per 24 hour  Intake 120 ml  Output 1350 ml  Net -1230 ml    PHYSICAL EXAMINATION:   GENERAL:  79 y.o.-year-old patient lying in the bed with no acute distress. thin HEENT: Head atraumatic, normocephalic. Oropharynx and nasopharynx clear.  edentulous.  LUNGS: Normal breath sounds bilaterally, no wheezing, rales, rhonchi. No use of accessory muscles of respiration.  CARDIOVASCULAR: S1, S2 normal. No murmurs, rubs, or gallops.  ABDOMEN: Soft, nontender, nondistended. Bowel sounds present. No organomegaly or mass.  EXTREMITIES: contractures NEUROLOGIC:Moves all extremities well PSYCHIATRIC:  patient is alert and awake SKIN: No obvious rash, lesion, or ulcer. Pressure Injury 04/20/20 Sacrum Stage 2 -  Partial thickness loss of dermis presenting as a shallow open injury with a red, pink wound bed without slough. (Active)  04/20/20 0200  Location: Sacrum  Location Orientation:   Staging: Stage 2 -  Partial thickness loss of dermis presenting as a shallow open injury with a red, pink wound bed without slough.  Wound Description (Comments):   Present on Admission: Yes    DATA REVIEW:   CBC  Recent Labs  Lab 04/23/20 0548  WBC 4.5  HGB 9.1*  HCT 28.2*  PLT 69*    Chemistries  Recent Labs  Lab 04/19/20 1009 04/20/20 0532 04/21/20 0327  NA 137 141  --   K 3.9 3.3*  --   CL 106 111  --   CO2 22 21*  --   GLUCOSE 128* 88  --   BUN 59* 61*  --   CREATININE 1.58* 1.01  --   CALCIUM 8.5* 8.4*  --   MG  --   --  1.8  AST 83*  --   --   ALT 65*  --   --   ALKPHOS 82  --   --   BILITOT 1.2  --   --     Microbiology Results   Recent Results (from the past 240 hour(s))  Resp Panel by RT-PCR (Flu A&B, Covid) Nasopharyngeal Swab     Status: None   Collection Time: 04/19/20 10:09 AM   Specimen: Nasopharyngeal Swab; Nasopharyngeal(NP) swabs in vial transport medium  Result Value Ref Range Status    SARS Coronavirus 2 by RT PCR NEGATIVE NEGATIVE Final    Comment: (NOTE) SARS-CoV-2 target nucleic acids are NOT DETECTED.  The SARS-CoV-2 RNA is generally detectable in upper respiratory specimens during the acute phase of infection. The  lowest concentration of SARS-CoV-2 viral copies this assay can detect is 138 copies/mL. A negative result does not preclude SARS-Cov-2 infection and should not be used as the sole basis for treatment or other patient management decisions. A negative result may occur with  improper specimen collection/handling, submission of specimen other than nasopharyngeal swab, presence of viral mutation(s) within the areas targeted by this assay, and inadequate number of viral copies(<138 copies/mL). A negative result must be combined with clinical observations, patient history, and epidemiological information. The expected result is Negative.  Fact Sheet for Patients:  EntrepreneurPulse.com.au  Fact Sheet for Healthcare Providers:  IncredibleEmployment.be  This test is no t yet approved or cleared by the [place] FDA and  has been authorized for detection and/or diagnosis of SARS-CoV-2 by FDA under an Emergency Use Authorization (EUA). This EUA will remain  in effect (meaning this test can be used) for the duration of the COVID-19 declaration under Section 564(b)(1) of the Act, 21 U.S.C.section 360bbb-3(b)(1), unless the authorization is terminated  or revoked sooner.       Influenza A by PCR NEGATIVE NEGATIVE Final   Influenza B by PCR NEGATIVE NEGATIVE Final    Comment: (NOTE) The Xpert Xpress SARS-CoV-2/FLU/RSV plus assay is intended as an aid in the diagnosis of influenza from Nasopharyngeal swab specimens and should not be used as a sole basis for treatment. Nasal washings and aspirates are unacceptable for Xpert Xpress SARS-CoV-2/FLU/RSV testing.  Fact Sheet for  Patients: EntrepreneurPulse.com.au  Fact Sheet for Healthcare Providers: IncredibleEmployment.be  This test is not yet approved or cleared by the [place] FDA and has been authorized for detection and/or diagnosis of SARS-CoV-2 by FDA under an Emergency Use Authorization (EUA). This EUA will remain in effect (meaning this test can be used) for the duration of the COVID-19 declaration under Section 564(b)(1) of the Act, 21 U.S.C. section 360bbb-3(b)(1), unless the authorization is terminated or revoked.  Performed at [facility], [address] [place], [place] [address]   Culture, blood (routine x 2)     Status: None (Preliminary result)   Collection Time: 04/19/20 12:24 PM   Specimen: BLOOD  Result Value Ref Range Status   Specimen Description BLOOD BLOOD LEFT FOREARM  Final   Special Requests   Final    BOTTLES DRAWN AEROBIC AND ANAEROBIC Blood Culture results may not be optimal due to an excessive volume of blood received in culture bottles   Culture   Final    NO GROWTH 4 DAYS Performed at [facility], [address] [place], [place] [address]    Report Status PENDING  Incomplete  Culture, blood (routine x 2)     Status: None (Preliminary result)   Collection Time: 04/19/20 12:32 PM   Specimen: BLOOD  Result Value Ref Range Status   Specimen Description BLOOD BLOOD LEFT HAND  Final   Special Requests   Final    BOTTLES DRAWN AEROBIC AND ANAEROBIC Blood Culture results may not be optimal due to an inadequate volume of blood received in culture bottles   Culture   Final    NO GROWTH 4 DAYS Performed at [facility], [address], [place], [place] [address]    Report Status PENDING  Incomplete  Urine Culture     Status: Abnormal   Collection Time: 04/19/20  7:34 PM   Specimen: Urine, Random  Result Value Ref Range Status   Specimen Description   Final    URINE, RANDOM Performed at [facility], [address], [place], [place] [address]    Special Requests   Final    NONE Performed at [facility], [address] [place], [place] [address]    Culture MULTIPLE SPECIES PRESENT, SUGGEST RECOLLECTION (A)  Final   Report Status 04/21/2020 FINAL  Final  MRSA PCR Screening     Status: None   Collection Time: 04/20/20 12:45 AM   Specimen: Nasopharyngeal  Result Value Ref Range Status   MRSA by PCR NEGATIVE NEGATIVE Final    Comment:        The GeneXpert MRSA Assay (FDA approved for NASAL specimens only), is one component of a comprehensive MRSA colonization surveillance program. It is not intended to diagnose MRSA infection nor to guide or monitor treatment for MRSA infections. Performed at [facility], [address], [place], [place] [address]     RADIOLOGY:  No results found.   CODE STATUS:     Code Status Orders  (From admission, onward)         Start     Ordered   04/19/20 1357  Full code  Continuous        04/19/20 1358        Code Status History    Date Active Date Inactive Code Status Order ID Comments User Context   03/10/2019 0634 03/14/2019 1801 Full Code [id number]  Hillary Bow, DO Inpatient   Advance Care Planning Activity       TOTAL TIME TAKING CARE OF THIS PATIENT: *35 minutes.    Enedina Finner M.D  [facility]    CC: Primary care physician; Alba Cory, MD

## 2020-04-24 LAB — CULTURE, BLOOD (ROUTINE X 2)
Culture: NO GROWTH
Culture: NO GROWTH

## 2020-04-26 ENCOUNTER — Encounter: Payer: Self-pay | Admitting: *Deleted

## 2020-04-26 ENCOUNTER — Telehealth: Payer: Self-pay

## 2020-04-26 ENCOUNTER — Other Ambulatory Visit: Payer: Self-pay | Admitting: *Deleted

## 2020-04-26 DIAGNOSIS — D709 Neutropenia, unspecified: Secondary | ICD-10-CM

## 2020-04-26 NOTE — Telephone Encounter (Signed)
Transition Care Management Follow-up Telephone Call  Date of discharge and from where: 04/23/20 [facility]  How have you been since you were released from the hospital? In house nurse Lupita Leash states pt is doing well and saying "me okay". Pt's O2 sats and BP in normal range per Lupita Leash and pressure sore superficial and improving.   Any questions or concerns? No  Items Reviewed:  Did the pt receive and understand the discharge instructions provided? Yes   Medications obtained and verified? Yes   Other? No   Any new allergies since your discharge? No   Dietary orders reviewed? Yes - puree diet  Do you have support at home? Yes  - lives in group home  Home Care and Equipment/Supplies: Were home health services ordered? no Were any new equipment or medical supplies ordered?  Yes: border dressing for pressure sore Were you able to get the supplies/equipment? yes Do you have any questions related to the use of the equipment or supplies? No  Functional Questionnaire: (I = Independent and D = Dependent) ADLs: D  Bathing/Dressing- D  Meal Prep- D  Eating- I with assistance  Maintaining continence- D  Transferring/Ambulation- D  Managing Meds- D  Follow up appointments reviewed:    PCP Hospital f/u appt confirmed? No  Provider schedule full; will send message for [facility] f/u appt confirmed? No  Pt's caregiver to schedule appt with Dr. Smith Robert.  Are transportation arrangements needed? No   If their condition worsens, is the pt aware to call PCP or go to the Emergency Dept.? Yes  Was the patient provided with contact information for the PCP's office or ED? Yes  Was to pt encouraged to call back with questions or concerns? Yes

## 2020-05-07 ENCOUNTER — Inpatient Hospital Stay
Admission: EM | Admit: 2020-05-07 | Discharge: 2020-05-17 | DRG: 193 | Disposition: A | Discharge status: Home / Self Care | Payer: Medicare Other | Attending: Family Medicine | Admitting: Family Medicine

## 2020-05-07 ENCOUNTER — Emergency Department: Payer: Medicare Other

## 2020-05-07 DIAGNOSIS — M6281 Muscle weakness (generalized): Secondary | ICD-10-CM | POA: Diagnosis not present

## 2020-05-07 DIAGNOSIS — B965 Pseudomonas (aeruginosa) (mallei) (pseudomallei) as the cause of diseases classified elsewhere: Secondary | ICD-10-CM | POA: Diagnosis present

## 2020-05-07 DIAGNOSIS — Z8546 Personal history of malignant neoplasm of prostate: Secondary | ICD-10-CM | POA: Diagnosis not present

## 2020-05-07 DIAGNOSIS — K5904 Chronic idiopathic constipation: Secondary | ICD-10-CM | POA: Diagnosis not present

## 2020-05-07 DIAGNOSIS — K921 Melena: Secondary | ICD-10-CM | POA: Diagnosis not present

## 2020-05-07 DIAGNOSIS — F79 Unspecified intellectual disabilities: Secondary | ICD-10-CM | POA: Diagnosis present

## 2020-05-07 DIAGNOSIS — K5721 Diverticulitis of large intestine with perforation and abscess with bleeding: Secondary | ICD-10-CM | POA: Diagnosis not present

## 2020-05-07 DIAGNOSIS — A419 Sepsis, unspecified organism: Secondary | ICD-10-CM

## 2020-05-07 DIAGNOSIS — E46 Unspecified protein-calorie malnutrition: Secondary | ICD-10-CM | POA: Diagnosis not present

## 2020-05-07 DIAGNOSIS — J189 Pneumonia, unspecified organism: Principal | ICD-10-CM

## 2020-05-07 DIAGNOSIS — L89300 Pressure ulcer of unspecified buttock, unstageable: Secondary | ICD-10-CM | POA: Diagnosis not present

## 2020-05-07 DIAGNOSIS — R6889 Other general symptoms and signs: Secondary | ICD-10-CM | POA: Diagnosis not present

## 2020-05-07 DIAGNOSIS — D62 Acute posthemorrhagic anemia: Secondary | ICD-10-CM | POA: Diagnosis not present

## 2020-05-07 DIAGNOSIS — Z993 Dependence on wheelchair: Secondary | ICD-10-CM

## 2020-05-07 DIAGNOSIS — G9341 Metabolic encephalopathy: Secondary | ICD-10-CM

## 2020-05-07 DIAGNOSIS — E785 Hyperlipidemia, unspecified: Secondary | ICD-10-CM | POA: Diagnosis not present

## 2020-05-07 DIAGNOSIS — Z7982 Long term (current) use of aspirin: Secondary | ICD-10-CM | POA: Diagnosis not present

## 2020-05-07 DIAGNOSIS — N3289 Other specified disorders of bladder: Secondary | ICD-10-CM | POA: Diagnosis not present

## 2020-05-07 DIAGNOSIS — K625 Hemorrhage of anus and rectum: Secondary | ICD-10-CM

## 2020-05-07 DIAGNOSIS — Z743 Need for continuous supervision: Secondary | ICD-10-CM | POA: Diagnosis not present

## 2020-05-07 DIAGNOSIS — D649 Anemia, unspecified: Secondary | ICD-10-CM | POA: Diagnosis not present

## 2020-05-07 DIAGNOSIS — F419 Anxiety disorder, unspecified: Secondary | ICD-10-CM | POA: Diagnosis present

## 2020-05-07 DIAGNOSIS — Z20822 Contact with and (suspected) exposure to covid-19: Secondary | ICD-10-CM | POA: Diagnosis present

## 2020-05-07 DIAGNOSIS — I739 Peripheral vascular disease, unspecified: Secondary | ICD-10-CM | POA: Diagnosis not present

## 2020-05-07 DIAGNOSIS — R0602 Shortness of breath: Secondary | ICD-10-CM | POA: Diagnosis not present

## 2020-05-07 DIAGNOSIS — N400 Enlarged prostate without lower urinary tract symptoms: Secondary | ICD-10-CM | POA: Diagnosis present

## 2020-05-07 DIAGNOSIS — K6389 Other specified diseases of intestine: Secondary | ICD-10-CM | POA: Diagnosis not present

## 2020-05-07 DIAGNOSIS — F418 Other specified anxiety disorders: Secondary | ICD-10-CM

## 2020-05-07 DIAGNOSIS — Z79899 Other long term (current) drug therapy: Secondary | ICD-10-CM | POA: Diagnosis not present

## 2020-05-07 DIAGNOSIS — N202 Calculus of kidney with calculus of ureter: Secondary | ICD-10-CM | POA: Diagnosis not present

## 2020-05-07 DIAGNOSIS — F32A Depression, unspecified: Secondary | ICD-10-CM | POA: Diagnosis not present

## 2020-05-07 DIAGNOSIS — Y95 Nosocomial condition: Secondary | ICD-10-CM | POA: Diagnosis present

## 2020-05-07 DIAGNOSIS — Z87442 Personal history of urinary calculi: Secondary | ICD-10-CM

## 2020-05-07 DIAGNOSIS — R5381 Other malaise: Secondary | ICD-10-CM | POA: Diagnosis not present

## 2020-05-07 DIAGNOSIS — R279 Unspecified lack of coordination: Secondary | ICD-10-CM | POA: Diagnosis not present

## 2020-05-07 DIAGNOSIS — E876 Hypokalemia: Secondary | ICD-10-CM | POA: Diagnosis not present

## 2020-05-07 DIAGNOSIS — R0902 Hypoxemia: Secondary | ICD-10-CM | POA: Diagnosis not present

## 2020-05-07 DIAGNOSIS — G809 Cerebral palsy, unspecified: Secondary | ICD-10-CM | POA: Diagnosis not present

## 2020-05-07 DIAGNOSIS — R531 Weakness: Secondary | ICD-10-CM | POA: Diagnosis not present

## 2020-05-07 DIAGNOSIS — N281 Cyst of kidney, acquired: Secondary | ICD-10-CM | POA: Diagnosis not present

## 2020-05-07 DIAGNOSIS — Z8719 Personal history of other diseases of the digestive system: Secondary | ICD-10-CM

## 2020-05-07 DIAGNOSIS — Z5309 Procedure and treatment not carried out because of other contraindication: Secondary | ICD-10-CM | POA: Diagnosis not present

## 2020-05-07 DIAGNOSIS — R7881 Bacteremia: Secondary | ICD-10-CM | POA: Diagnosis not present

## 2020-05-07 DIAGNOSIS — R404 Transient alteration of awareness: Secondary | ICD-10-CM | POA: Diagnosis not present

## 2020-05-07 DIAGNOSIS — I499 Cardiac arrhythmia, unspecified: Secondary | ICD-10-CM | POA: Diagnosis not present

## 2020-05-07 LAB — CBC WITH DIFFERENTIAL/PLATELET
Abs Immature Granulocytes: 0.38 10*3/uL — ABNORMAL HIGH (ref 0.00–0.07)
Basophils Absolute: 0.1 10*3/uL (ref 0.0–0.1)
Basophils Relative: 0 %
Eosinophils Absolute: 0 10*3/uL (ref 0.0–0.5)
Eosinophils Relative: 0 %
HCT: 32.3 % — ABNORMAL LOW (ref 39.0–52.0)
Hemoglobin: 10.6 g/dL — ABNORMAL LOW (ref 13.0–17.0)
Immature Granulocytes: 2 %
Lymphocytes Relative: 6 %
Lymphs Abs: 1.4 10*3/uL (ref 0.7–4.0)
MCH: 31.6 pg (ref 26.0–34.0)
MCHC: 32.8 g/dL (ref 30.0–36.0)
MCV: 96.4 fL (ref 80.0–100.0)
Monocytes Absolute: 1.2 10*3/uL — ABNORMAL HIGH (ref 0.1–1.0)
Monocytes Relative: 5 %
Neutro Abs: 21.1 10*3/uL — ABNORMAL HIGH (ref 1.7–7.7)
Neutrophils Relative %: 87 %
Platelets: 201 10*3/uL (ref 150–400)
RBC: 3.35 MIL/uL — ABNORMAL LOW (ref 4.22–5.81)
RDW: 13.4 % (ref 11.5–15.5)
Smear Review: NORMAL
WBC: 24.1 10*3/uL — ABNORMAL HIGH (ref 4.0–10.5)
nRBC: 0 % (ref 0.0–0.2)

## 2020-05-07 LAB — COMPREHENSIVE METABOLIC PANEL
ALT: 9 U/L (ref 0–44)
AST: 14 U/L — ABNORMAL LOW (ref 15–41)
Albumin: 2.7 g/dL — ABNORMAL LOW (ref 3.5–5.0)
Alkaline Phosphatase: 93 U/L (ref 38–126)
Anion gap: 8 (ref 5–15)
BUN: 25 mg/dL — ABNORMAL HIGH (ref 8–23)
CO2: 27 mmol/L (ref 22–32)
Calcium: 8.6 mg/dL — ABNORMAL LOW (ref 8.9–10.3)
Chloride: 103 mmol/L (ref 98–111)
Creatinine, Ser: 0.72 mg/dL (ref 0.61–1.24)
GFR, Estimated: >60 mL/min (ref >60)
Glucose, Bld: 98 mg/dL (ref 70–99)
Potassium: 3.5 mmol/L (ref 3.5–5.1)
Sodium: 138 mmol/L (ref 135–145)
Total Bilirubin: 1.1 mg/dL (ref 0.3–1.2)
Total Protein: 6.3 g/dL — ABNORMAL LOW (ref 6.5–8.1)

## 2020-05-07 LAB — PROCALCITONIN: Procalcitonin: 0.96 ng/mL

## 2020-05-07 LAB — LACTIC ACID, PLASMA: Lactic Acid, Venous: 1.2 mmol/L (ref 0.5–1.9)

## 2020-05-07 LAB — RESP PANEL BY RT-PCR (FLU A&B, COVID) ARPGX2
Influenza A by PCR: NEGATIVE
Influenza B by PCR: NEGATIVE
SARS Coronavirus 2 by RT PCR: NEGATIVE

## 2020-05-07 LAB — TROPONIN I (HIGH SENSITIVITY): Troponin I (High Sensitivity): 12 ng/L (ref <18)

## 2020-05-07 LAB — BRAIN NATRIURETIC PEPTIDE: B Natriuretic Peptide: 232 pg/mL — ABNORMAL HIGH (ref 0.0–100.0)

## 2020-05-07 MED ORDER — LACTATED RINGERS IV BOLUS
1000.0000 mL | Freq: Once | INTRAVENOUS | Status: AC
  Administered 2020-05-07: 1000 mL via INTRAVENOUS

## 2020-05-07 MED ORDER — ATORVASTATIN CALCIUM 20 MG PO TABS
40.0000 mg | ORAL_TABLET | Freq: Every evening | ORAL | Status: DC
  Administered 2020-05-08 – 2020-05-16 (×8): 40 mg via ORAL
  Filled 2020-05-07 (×10): qty 2

## 2020-05-07 MED ORDER — POLYETHYL GLYCOL-PROPYL GLYCOL 0.4-0.3 % OP SOLN: 1.0000 [drp] | Freq: Two times a day (BID) | OPHTHALMIC | Status: DC

## 2020-05-07 MED ORDER — ASCORBIC ACID 500 MG PO TABS
500.0000 mg | ORAL_TABLET | Freq: Every day | ORAL | Status: DC
  Administered 2020-05-08 – 2020-05-17 (×7): 500 mg via ORAL
  Filled 2020-05-07 (×9): qty 1

## 2020-05-07 MED ORDER — CALCIUM CARBONATE-VITAMIN D 500-200 MG-UNIT PO TABS
1.0000 | ORAL_TABLET | Freq: Two times a day (BID) | ORAL | Status: DC
  Administered 2020-05-08 – 2020-05-17 (×15): 1 via ORAL
  Filled 2020-05-07 (×20): qty 1

## 2020-05-07 MED ORDER — SODIUM CHLORIDE 0.9 % IV SOLN
2.0000 g | Freq: Once | INTRAVENOUS | Status: AC
  Administered 2020-05-07: 2 g via INTRAVENOUS
  Filled 2020-05-07: qty 2

## 2020-05-07 MED ORDER — ACETAMINOPHEN 325 MG PO TABS
650.0000 mg | ORAL_TABLET | Freq: Four times a day (QID) | ORAL | Status: DC | PRN
  Administered 2020-05-09 – 2020-05-12 (×3): 650 mg via ORAL
  Filled 2020-05-07 (×4): qty 2

## 2020-05-07 MED ORDER — ONDANSETRON HCL 4 MG/2ML IJ SOLN: 4.0000 mg | Freq: Three times a day (TID) | INTRAMUSCULAR | Status: DC | PRN

## 2020-05-07 MED ORDER — ALFUZOSIN HCL ER 10 MG PO TB24
10.0000 mg | ORAL_TABLET | Freq: Every day | ORAL | Status: DC
Start: 2020-05-07 — End: 2020-05-17
  Administered 2020-05-08 – 2020-05-17 (×7): 10 mg via ORAL
  Filled 2020-05-07 (×12): qty 1

## 2020-05-07 MED ORDER — LUBIPROSTONE 24 MCG PO CAPS
24.0000 ug | ORAL_CAPSULE | Freq: Two times a day (BID) | ORAL | Status: DC
  Administered 2020-05-08 – 2020-05-17 (×13): 24 ug via ORAL
  Filled 2020-05-07 (×23): qty 1

## 2020-05-07 MED ORDER — ENOXAPARIN SODIUM 40 MG/0.4ML ~~LOC~~ SOLN
40.0000 mg | SUBCUTANEOUS | Status: DC
  Administered 2020-05-08 (×2): 40 mg via SUBCUTANEOUS
  Filled 2020-05-07 (×2): qty 0.4

## 2020-05-07 MED ORDER — VANCOMYCIN HCL IN DEXTROSE 1-5 GM/200ML-% IV SOLN: 1000.0000 mg | Freq: Once | INTRAVENOUS | Status: DC

## 2020-05-07 MED ORDER — ASPIRIN EC 81 MG PO TBEC
81.0000 mg | DELAYED_RELEASE_TABLET | Freq: Every day | ORAL | Status: DC
Start: 2020-05-07 — End: 2020-05-17
  Administered 2020-05-08 – 2020-05-17 (×7): 81 mg via ORAL
  Filled 2020-05-07 (×8): qty 1

## 2020-05-07 MED ORDER — ALPRAZOLAM 0.25 MG PO TABS
0.2500 mg | ORAL_TABLET | Freq: Two times a day (BID) | ORAL | Status: DC | PRN
  Administered 2020-05-09: 0.25 mg via ORAL
  Filled 2020-05-07: qty 1

## 2020-05-07 MED ORDER — ACETAMINOPHEN 650 MG RE SUPP: 650.0000 mg | Freq: Four times a day (QID) | RECTAL | Status: DC | PRN

## 2020-05-07 MED ORDER — ENOXAPARIN SODIUM 40 MG/0.4ML ~~LOC~~ SOLN: 40.0000 mg | SUBCUTANEOUS | Status: DC

## 2020-05-07 MED ORDER — VANCOMYCIN HCL 750 MG/150ML IV SOLN
750.0000 mg | Freq: Two times a day (BID) | INTRAVENOUS | Status: DC
  Administered 2020-05-08: 750 mg via INTRAVENOUS
  Filled 2020-05-07 (×3): qty 150

## 2020-05-07 MED ORDER — SODIUM CHLORIDE 0.9 % IV SOLN
500.0000 mg | Freq: Once | INTRAVENOUS | Status: AC
  Administered 2020-05-07: 500 mg via INTRAVENOUS
  Filled 2020-05-07: qty 500

## 2020-05-07 MED ORDER — SERTRALINE HCL 50 MG PO TABS
100.0000 mg | ORAL_TABLET | Freq: Every day | ORAL | Status: DC
  Administered 2020-05-08 – 2020-05-17 (×7): 100 mg via ORAL
  Filled 2020-05-07 (×8): qty 2

## 2020-05-07 MED ORDER — POLYVINYL ALCOHOL 1.4 % OP SOLN
2.0000 [drp] | Freq: Two times a day (BID) | OPHTHALMIC | Status: DC | PRN
  Filled 2020-05-07: qty 15

## 2020-05-07 MED ORDER — SODIUM CHLORIDE 0.9 % IV SOLN
2.0000 g | Freq: Three times a day (TID) | INTRAVENOUS | Status: DC
  Administered 2020-05-07 – 2020-05-08 (×3): 2 g via INTRAVENOUS
  Filled 2020-05-07 (×5): qty 2

## 2020-05-07 MED ORDER — CRANBERRY JUICE POWDER 425 MG PO CAPS: 425.0000 mg | ORAL_CAPSULE | Freq: Every day | ORAL | Status: DC

## 2020-05-07 MED ORDER — LACTATED RINGERS IV SOLN: INTRAVENOUS | Status: DC

## 2020-05-07 MED ORDER — VANCOMYCIN HCL 1500 MG/300ML IV SOLN
1500.0000 mg | Freq: Once | INTRAVENOUS | Status: AC
  Administered 2020-05-07: 1500 mg via INTRAVENOUS
  Filled 2020-05-07: qty 300

## 2020-05-07 MED ORDER — ENSURE MAX PROTEIN PO LIQD
1.0000 | Freq: Two times a day (BID) | ORAL | Status: DC
  Administered 2020-05-08 – 2020-05-17 (×13): 330 mL via ORAL
  Filled 2020-05-07: qty 330

## 2020-05-07 NOTE — Progress Notes (Signed)
elink monitoring code sepsis.  

## 2020-05-07 NOTE — ED Provider Notes (Signed)
[facility] Emergency Department Provider Note   ____________________________________________   Event Date/Time   First MD Initiated Contact with Patient 05/07/20 [phone]     (approximate)  I have reviewed the triage vital signs and the nursing notes.   HISTORY  Chief Complaint Shortness of Breath    HPI [name] is a 80 y.o. male with past medical history of cerebral palsy and quadriplegia, hyperlipidemia, and peripheral arterial disease who presents to the ED for altered mental status.  History is limited as patient is nonverbal at baseline.  Per EMS, staff at patient's group home/skilled nursing facility has noticed that he has had decreased p.o. intake over the past 24 hours and less interactive than usual.  They have not noticed any fevers or cough, but patient has appeared short of breath and tachypneic at times.  EMS was called and initial room air sat was found to be 83%.  Patient also noted to have initial systolic blood pressure in the 80s.  He was given 500 cc of fluids by EMS with improvement of systolic into the 0000000.  He did have a recent admission for sepsis secondary to pneumonia.        Past Medical History:  Diagnosis Date  . Anxiety   . Atrophy, disuse, muscle   . BPH (benign prostatic hyperplasia)   . Cerebral palsy ([place])   . Eczema   . Elevated lipids   . Elevated PSA   . Exotropia   . Hyperlipidemia   . Kidney stone   . Leukopenia   . Lower extremity edema    lymphedema  . Mental retardation   . Over weight   . Prostate cancer ([place])   . Venous insufficiency   . Vitamin D deficiency   . Wheelchair dependence     Patient Active Problem List   Diagnosis Date Noted  . Protein-calorie malnutrition, severe 04/22/2020  . Pressure injury of skin 04/22/2020  . Multifocal pneumonia   . Sepsis due to undetermined organism ([place]) 04/19/2020  . Personal history of urinary calculi 06/21/2019  . PAD (peripheral artery  disease) ([place]) 07/04/2017  . Aortic valve calcification 07/04/2017  . Coronary artery calcification 07/04/2017  . Late effect acute polio 05/17/2016  . Chronic constipation 11/19/2015  . Mild major depression ([place]) 11/19/2015  . Bacteriuria, chronic 09/11/2015  . History of prostate cancer 03/02/2015  . Amyotrophia 03/02/2015  . Edema leg 03/02/2015  . Cerebral palsy ([place]) 03/02/2015  . Dyslipidemia 03/02/2015  . Dermatitis, eczematoid 03/02/2015  . 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 H/O acute poliomyelitis 03/02/2015  . Lymphedema 03/02/2015  . Intellectual disability 03/02/2015  . Hypertrophy of nail 03/02/2015  . Chronic venous insufficiency 03/02/2015  . Avitaminosis D 03/02/2015  . Adynamia 03/02/2015  . Dependent on wheelchair 03/02/2015  . Renal cysts, acquired, bilateral 03/02/2015  . At risk for falling 03/02/2015  . Cerebral vascular disease 03/02/2015  . Strabismus 02/11/2015  . Urge incontinence of urine 02/11/2015  . Wheelchair dependence 02/11/2015  . Urinary frequency 10/30/2014    Past Surgical History:  Procedure Laterality Date  . CYSTOSCOPY/URETEROSCOPY/HOLMIUM LASER/STENT PLACEMENT Right 08/14/2017   Procedure: CYSTOSCOPY/URETEROSCOPY/HOLMIUM LASER/STENT PLACEMENT;  Surgeon: Abbie Sons, MD;  Location: [facility] ORS;  Service: Urology;  Laterality: Right;  . leg circulation surgery Right   . PROSTATE SURGERY  11/10/2008   BRACHYTHERAPY    Prior to Admission medications   Medication Sig Start Date End Date Taking? Authorizing Provider  acetaminophen (TYLENOL) 325 MG tablet  TAKE 2 TABLETS (650 MG TOTAL) BY MOUTH EVERY 6 HOURS AS NEEDED FOR MILD PAIN OR HEADACHE. 01/29/20   Steele Sizer, MD  alfuzosin (UROXATRAL) 10 MG 24 hr tablet Take 1 tablet (10 mg total) by mouth daily. 03/01/20   Sowles, Drue Stager, MD  ALPRAZolam (XANAX) 0.25 MG tablet TAKE ONE TABLET BY MOUTH 2 TIMES A DAY AS NEEDED FOR ANXIETY (WHEN BLOOD PRESSURE IS VERY HIGH OR ANXIOUS) Patient taking  differently: Take 0.25 mg by mouth 2 (two) times daily as needed for anxiety or sleep. TAKE ONE TABLET BY MOUTH 2 TIMES A DAY AS NEEDED FOR ANXIETY (WHEN BLOOD PRESSURE IS VERY HIGH OR ANXIOUS) 06/02/19   Sowles, Drue Stager, MD  ARTIFICIAL TEARS 0.2-0.2-1 % SOLN PLACE 2 DROPS INTO EYE 2 TIMES A DAY AS NEEDED 07/24/19   Ancil Boozer, Drue Stager, MD  ascorbic acid (VITAMIN C) 500 MG tablet TAKE ONE TABLET BY MOUTH ONCE DAILY Patient taking differently: Take 500 mg by mouth daily. 03/15/20   Steele Sizer, MD  aspirin (ASPIR-LOW) 81 MG EC tablet TAKE 1 TABLET BY MOUTH ONCE DAILY Patient taking differently: Take 81 mg by mouth daily. TAKE 1 TABLET BY MOUTH ONCE DAILY 11/18/19   Steele Sizer, MD  atorvastatin (LIPITOR) 40 MG tablet Take 1 tablet (40 mg total) by mouth every evening. 03/01/20   Steele Sizer, MD  augmented betamethasone dipropionate (DIPROLENE-AF) 0.05 % cream APPLY TOPICALLY AS DIRECTED TO RASH AS NEEDED 06/17/19   Steele Sizer, MD  Calcium Carb-Cholecalciferol (CALCIUM-VITAMIN D) 500-400 MG-UNIT TABS TAKE 1 TABLET BY MOUTH 2 TIMES A DAY 02/16/20   Sowles, Drue Stager, MD  Cranberry Juice Powder 425 MG CAPS TAKE 1 CAPSULE BY MOUTH ONCE DAILY Patient taking differently: Take 425 mg by mouth daily. 11/18/19   Steele Sizer, MD  Elastic Bandages & Supports (MEDICAL COMPRESSION STOCKINGS) MISC 1 Units by Does not apply route daily. 06/02/19   Steele Sizer, MD  Incontinence Supply Disposable (DEPEND SILHOUETTE BRIEFS L/XL) MISC 1 each by Does not apply route 2 (two) times daily as needed. 06/02/19   Steele Sizer, MD  lubiprostone (AMITIZA) 24 MCG capsule TAKE 1 CAPSULE BY MOUTH 2 TIMES DAILY WITH A MEAL Patient taking differently: Take 24 mcg by mouth 2 (two) times daily with a meal. TAKE 1 CAPSULE BY MOUTH 2 TIMES DAILY WITH A MEAL 03/01/20   Sowles, Drue Stager, MD  Nutritional Supplements (ENSURE NUTRITION SHAKE) LIQD Take 1 each by mouth 2 (two) times daily. 06/02/19   Steele Sizer, MD  Polyethyl  Glycol-Propyl Glycol (SYSTANE ULTRA) 0.4-0.3 % SOLN Apply 1 drop to eye 2 (two) times daily. Both eyes 06/02/19   Steele Sizer, MD  sertraline (ZOLOFT) 100 MG tablet Take 1 tablet (100 mg total) by mouth daily. 03/01/20   Steele Sizer, MD    Allergies Patient has no known allergies.  Family History  Problem Relation Age of Onset  . Heart attack Brother   . Heart disease Other     Social History Social History   Tobacco Use  . Smoking status: Never Smoker  . Smokeless tobacco: Never Used  Vaping Use  . Vaping Use: Never used  Substance Use Topics  . Alcohol use: No    Alcohol/week: 0.0 standard drinks  . Drug use: No    Review of Systems Unable to obtain secondary to patient nonverbal at baseline  ____________________________________________   PHYSICAL EXAM:  VITAL SIGNS: ED Triage Vitals [05/07/20 0734]  Enc Vitals Group     BP 105/68  Pulse Rate 75     Resp 19     Temp 97.9 F (36.6 C)     Temp Source Axillary     SpO2      Weight      Height      Head Circumference      Peak Flow      Pain Score      Pain Loc      Pain Edu?      Excl. in [place]?     Constitutional: Awake and alert, nonverbal. Eyes: Conjunctivae are normal.  Pupils equal round and reactive to light bilaterally. Head: Atraumatic. Nose: No congestion/rhinnorhea. Mouth/Throat: Mucous membranes are dry. Neck: Normal ROM Cardiovascular: Normal rate, regular rhythm. Grossly normal heart sounds.  2+ radial and DP pulses bilaterally. Respiratory: Normal respiratory effort.  No retractions. Lungs CTAB. Gastrointestinal: Soft and nontender. No distention. Genitourinary: deferred Musculoskeletal: No lower extremity tenderness nor edema. Neurologic: Nonverbal at baseline. No gross focal neurologic deficits are appreciated, patient moving all 4 extremities spontaneously. Skin:  Skin is warm, dry and intact. No rash noted. Psychiatric: Mood and affect are normal. Speech and behavior are  normal.  ____________________________________________   LABS (all labs ordered are listed, but only abnormal results are displayed)  Labs Reviewed  CBC WITH DIFFERENTIAL/PLATELET - Abnormal; Notable for the following components:      Result Value   WBC 24.1 (*)    RBC 3.35 (*)    Hemoglobin 10.6 (*)    HCT 32.3 (*)    Neutro Abs 21.1 (*)    Monocytes Absolute 1.2 (*)    Abs Immature Granulocytes 0.38 (*)    All other components within normal limits  COMPREHENSIVE METABOLIC PANEL - Abnormal; Notable for the following components:   BUN 25 (*)    Calcium 8.6 (*)    Total Protein 6.3 (*)    Albumin 2.7 (*)    AST 14 (*)    All other components within normal limits  RESP PANEL BY RT-PCR (FLU A&B, COVID) ARPGX2  CULTURE, BLOOD (ROUTINE X 2)  CULTURE, BLOOD (ROUTINE X 2)  LACTIC ACID, PLASMA  LACTIC ACID, PLASMA  URINALYSIS, COMPLETE (UACMP) WITH MICROSCOPIC  PROCALCITONIN  BRAIN NATRIURETIC PEPTIDE  TROPONIN I (HIGH SENSITIVITY)   ____________________________________________  EKG  ED ECG REPORT I, Blake Divine, the attending physician, personally viewed and interpreted this ECG.   Date: 05/07/2020  EKG Time: 7:36  Rate: 75  Rhythm: normal sinus rhythm  Axis: Normal  Intervals:none  ST&T Change: None   PROCEDURES  Procedure(s) performed (including Critical Care):  Procedures   ____________________________________________   INITIAL IMPRESSION / ASSESSMENT AND PLAN / ED COURSE       80 year old male with past medical history of cerebral palsy with quadriplegia, nonverbal at baseline, who presents to the ED due to concern for altered mental status and decreased p.o. intake over the past 24 hours.  EMS reports initial O2 sat was 83% on room air, however patient's pulse ox was moved to his ear and now appears to be 100% on room air.  Blood pressure improved following 500 cc fluid bolus but given initial hypotension and question of altered mental status, we will  start sepsis work-up.  Patient does not appear to have any focal neurologic deficits at this time, will assess with CT head given question of altered mental status.  CT head is negative for acute process, chest x-ray reviewed by me and shows multifocal infiltrates concerning for pneumonia.  COVID-19  testing is negative and given his marked leukocytosis, I am concerned for a bacterial pneumonia.  He did have an admission last month for similar pneumonia and we will broaden antibiotics to include cefepime, vancomycin, and azithromycin.  I would also consider recurrent aspiration in this patient with cerebral palsy.  He continues to maintain O2 sats on room air.  Case discussed with hospitalist for admission.      ____________________________________________   FINAL CLINICAL IMPRESSION(S) / ED DIAGNOSES  Final diagnoses:  HCAP (healthcare-associated pneumonia)  Sepsis without acute organ dysfunction, due to unspecified organism [facility])     ED Discharge Orders    None       Note:  This document was prepared using Dragon voice recognition software and may include unintentional dictation errors.   Blake Divine, MD 05/07/20 1007

## 2020-05-07 NOTE — Consult Note (Signed)
Pharmacy Antibiotic Note  [name] is a 80 y.o. male with medical history including cerebral palsy with quadriplegia admitted on 05/07/2020 with pneumonia. Patient was recently admitted 12/27 - 12/31 with septic shock secondary to pneumonia and was treated with broad spectrum antibiotics. Pharmacy has been consulted for vancomycin and cefepime dosing.  Plan: Cefepime 2 g IV q8h  Vancomycin 1.5 g IV LD followed by maintenance regimen of vancomycin 750 mg IV q12h --Predicted AUC: 479.3, Cmin 13.6 --Daily Scr per protocol while on vancomycin --Levels at steady state as indicated  Continue to monitor renal function and adjust antibiotics as indicated. Continue to monitor culture data and ability to narrow antibiotics.  Weight: 61.5 kg (135 lb 9.3 oz)  Temp (24hrs), Avg:97.9 F (36.6 C), Min:97.9 F (36.6 C), Max:97.9 F (36.6 C)  Recent Labs  Lab 05/07/20 0740  WBC 24.1*  CREATININE 0.72  LATICACIDVEN 1.2    Estimated Creatinine Clearance: 67.3 mL/min (by C-G formula based on SCr of 0.72 mg/dL).    No Known Allergies  Antimicrobials this admission: Azithromycin 1/14 x 1 Cefepime 1/14 >>  Vancomycin 1/14 >>   Dose adjustments this admission: N/A  Microbiology results: 1/14 BCx: pending 1/14 MRSA PCR: pending  Thank you for allowing pharmacy to be a part of this patient's care.  Benita Gutter 05/07/2020 10:43 AM

## 2020-05-07 NOTE — ED Notes (Signed)
Legal guardian contacted in regards to pt diet. LG stating pt need pureed diet. MD made aware.

## 2020-05-07 NOTE — ED Notes (Signed)
[facility] contact number- [phone].

## 2020-05-07 NOTE — ED Notes (Signed)
Med rec being completed by pharmacy at this time.

## 2020-05-07 NOTE — Consult Note (Signed)
PHARMACY -  BRIEF ANTIBIOTIC NOTE   Pharmacy has received consult(s) for vancomycin and cefepime from an ED provider. Patient is also ordered azithromycin. [name] profile has been reviewed for ht/wt/allergies/indication/available labs.    One time order(s) placed for --Cefepime 2 g IV --Vancomycin 1 g IV  Further antibiotics/pharmacy consults should be ordered by admitting physician if indicated.                       Thank you, Benita Gutter 05/07/2020  9:43 AM

## 2020-05-07 NOTE — H&P (Signed)
History and Physical    [name] [id number] DOB: [birth year] DOA: 05/07/2020  Referring MD/NP/PA:   PCP: Steele Sizer, MD   Patient coming from:  The patient is coming from group home.  At baseline, pt is dependent for most of ADL.        Chief Complaint: SOB  HPI: [name] is a 80 y.o. male with medical history significant of cerebral palsy, nonverbal at baseline, hyperlipidemia, wheelchair-bound, prostate cancer, BPH, lymphedema, PAD, depression, anxiety, who presents with SOB  Patient has  history of cerebral palsy, basically nonverbal at baseline, cannot provide medical history.  I called her niece by phone, who provided very limited medical history.  Per report, patient was noted to have shortness of breath, cough, more confused than baseline, less interactive.  Patient has poor appetite and decreased oral intake.  Patient does not seem to have chest pain.  No active nausea, vomiting, diarrhea noted.  Patient was found to have oxygen desaturation to 83% initially, which improved to 100% on room air in the ED later on.  Initially patient had hypotension with SBP of 80s, which improved to 91/68 after giving 500 cc normal saline.  ED Course: pt was found to have WBC 24.1, lactic acid of 1.82, troponin level 12, negative COVID PCR, electrolytes renal function okay, pending urinalysis, temperature normal, blood pressure 91/68 currently, heart rate 67, RR 19. chest x-ray showed bilateral patchy infiltration.  CT head is negative. Patient is admitted to [facility] bed as inpatient  Review of Systems: Could not be reviewed due to altered mental status and nonverbal  Allergy: No Known Allergies  Past Medical History:  Diagnosis Date  . Anxiety   . Atrophy, disuse, muscle   . BPH (benign prostatic hyperplasia)   . Cerebral palsy ([place])   . Eczema   . Elevated lipids   . Elevated PSA   . Exotropia   . Hyperlipidemia   . Kidney stone   . Leukopenia   . Lower  extremity edema    lymphedema  . Mental retardation   . Over weight   . Prostate cancer ([place])   . Venous insufficiency   . Vitamin D deficiency   . Wheelchair dependence     Past Surgical History:  Procedure Laterality Date  . CYSTOSCOPY/URETEROSCOPY/HOLMIUM LASER/STENT PLACEMENT Right 08/14/2017   Procedure: CYSTOSCOPY/URETEROSCOPY/HOLMIUM LASER/STENT PLACEMENT;  Surgeon: Abbie Sons, MD;  Location: [facility] ORS;  Service: Urology;  Laterality: Right;  . leg circulation surgery Right   . PROSTATE SURGERY  11/10/2008   BRACHYTHERAPY    Social History:  reports that he has never smoked. He has never used smokeless tobacco. He reports that he does not drink alcohol and does not use drugs.  Family History:  Family History  Problem Relation Age of Onset  . Heart attack Brother   . Heart disease Other      Prior to Admission medications   Medication Sig Start Date End Date Taking? Authorizing Provider  acetaminophen (TYLENOL) 325 MG tablet TAKE 2 TABLETS (650 MG TOTAL) BY MOUTH EVERY 6 HOURS AS NEEDED FOR MILD PAIN OR HEADACHE. 01/29/20   Steele Sizer, MD  alfuzosin (UROXATRAL) 10 MG 24 hr tablet Take 1 tablet (10 mg total) by mouth daily. 03/01/20   Sowles, Drue Stager, MD  ALPRAZolam (XANAX) 0.25 MG tablet TAKE ONE TABLET BY MOUTH 2 TIMES A DAY AS NEEDED FOR ANXIETY (WHEN BLOOD PRESSURE IS VERY HIGH OR ANXIOUS) Patient taking differently: Take 0.25 mg by mouth  2 (two) times daily as needed for anxiety or sleep. TAKE ONE TABLET BY MOUTH 2 TIMES A DAY AS NEEDED FOR ANXIETY (WHEN BLOOD PRESSURE IS VERY HIGH OR ANXIOUS) 06/02/19   Sowles, Drue Stager, MD  ARTIFICIAL TEARS 0.2-0.2-1 % SOLN PLACE 2 DROPS INTO EYE 2 TIMES A DAY AS NEEDED 07/24/19   Ancil Boozer, Drue Stager, MD  ascorbic acid (VITAMIN C) 500 MG tablet TAKE ONE TABLET BY MOUTH ONCE DAILY Patient taking differently: Take 500 mg by mouth daily. 03/15/20   Steele Sizer, MD  aspirin (ASPIR-LOW) 81 MG EC tablet TAKE 1 TABLET BY MOUTH ONCE  DAILY Patient taking differently: Take 81 mg by mouth daily. TAKE 1 TABLET BY MOUTH ONCE DAILY 11/18/19   Steele Sizer, MD  atorvastatin (LIPITOR) 40 MG tablet Take 1 tablet (40 mg total) by mouth every evening. 03/01/20   Steele Sizer, MD  augmented betamethasone dipropionate (DIPROLENE-AF) 0.05 % cream APPLY TOPICALLY AS DIRECTED TO RASH AS NEEDED 06/17/19   Steele Sizer, MD  Calcium Carb-Cholecalciferol (CALCIUM-VITAMIN D) 500-400 MG-UNIT TABS TAKE 1 TABLET BY MOUTH 2 TIMES A DAY 02/16/20   Sowles, Drue Stager, MD  Cranberry Juice Powder 425 MG CAPS TAKE 1 CAPSULE BY MOUTH ONCE DAILY Patient taking differently: Take 425 mg by mouth daily. 11/18/19   Steele Sizer, MD  Elastic Bandages & Supports (MEDICAL COMPRESSION STOCKINGS) MISC 1 Units by Does not apply route daily. 06/02/19   Steele Sizer, MD  Incontinence Supply Disposable (DEPEND SILHOUETTE BRIEFS L/XL) MISC 1 each by Does not apply route 2 (two) times daily as needed. 06/02/19   Steele Sizer, MD  lubiprostone (AMITIZA) 24 MCG capsule TAKE 1 CAPSULE BY MOUTH 2 TIMES DAILY WITH A MEAL Patient taking differently: Take 24 mcg by mouth 2 (two) times daily with a meal. TAKE 1 CAPSULE BY MOUTH 2 TIMES DAILY WITH A MEAL 03/01/20   Sowles, Drue Stager, MD  Nutritional Supplements (ENSURE NUTRITION SHAKE) LIQD Take 1 each by mouth 2 (two) times daily. 06/02/19   Steele Sizer, MD  Polyethyl Glycol-Propyl Glycol (SYSTANE ULTRA) 0.4-0.3 % SOLN Apply 1 drop to eye 2 (two) times daily. Both eyes 06/02/19   Steele Sizer, MD  sertraline (ZOLOFT) 100 MG tablet Take 1 tablet (100 mg total) by mouth daily. 03/01/20   Steele Sizer, MD    Physical Exam: Vitals:   05/07/20 1100 05/07/20 1130 05/07/20 1200 05/07/20 1300  BP: 114/77 116/86 105/84 119/70  Pulse: 68 74 68 62  Resp:  20 18 16   Temp:      TempSrc:      SpO2: 100% 100% 100% 100%  Weight:       General: Not in acute distress HEENT:       Eyes: PERRL, EOMI, no scleral icterus.        ENT: No discharge from the ears and nose, no pharynx injection, no tonsillar enlargement.        Neck: No JVD, no bruit, no mass felt. Heme: No neck lymph node enlargement. Cardiac: S1/S2, RRR, No murmurs, No gallops or rubs. Respiratory: No rales, wheezing, rhonchi or rubs. GI: Soft, nondistended, nontender, no rebound pain, no organomegaly, BS present. GU: No hematuria Ext: No pitting leg edema bilaterally. 2+DP/PT pulse bilaterally. Musculoskeletal: No joint deformities, No joint redness or warmth, no limitation of ROM in spin. Skin: No rashes.  Neuro: Confused, nonverbal, not sure if patient is oriented, cranial nerves II-XII grossly intact, moves all extremities. Psych: Patient is not psychotic.  Labs on Admission: I have personally reviewed  following labs and imaging studies  CBC: Recent Labs  Lab 05/07/20 0740  WBC 24.1*  NEUTROABS 21.1*  HGB 10.6*  HCT 32.3*  MCV 96.4  PLT 811   Basic Metabolic Panel: Recent Labs  Lab 05/07/20 0740  NA 138  K 3.5  CL 103  CO2 27  GLUCOSE 98  BUN 25*  CREATININE 0.72  CALCIUM 8.6*   GFR: Estimated Creatinine Clearance: 67.3 mL/min (by C-G formula based on SCr of 0.72 mg/dL). Liver Function Tests: Recent Labs  Lab 05/07/20 0740  AST 14*  ALT 9  ALKPHOS 93  BILITOT 1.1  PROT 6.3*  ALBUMIN 2.7*   No results for input(s): LIPASE, AMYLASE in the last 168 hours. No results for input(s): AMMONIA in the last 168 hours. Coagulation Profile: No results for input(s): INR, PROTIME in the last 168 hours. Cardiac Enzymes: No results for input(s): CKTOTAL, CKMB, CKMBINDEX, TROPONINI in the last 168 hours. BNP (last 3 results) No results for input(s): PROBNP in the last 8760 hours. HbA1C: No results for input(s): HGBA1C in the last 72 hours. CBG: No results for input(s): GLUCAP in the last 168 hours. Lipid Profile: No results for input(s): CHOL, HDL, LDLCALC, TRIG, CHOLHDL, LDLDIRECT in the last 72 hours. Thyroid Function  Tests: No results for input(s): TSH, T4TOTAL, FREET4, T3FREE, THYROIDAB in the last 72 hours. Anemia Panel: No results for input(s): VITAMINB12, FOLATE, FERRITIN, TIBC, IRON, RETICCTPCT in the last 72 hours. Urine analysis:    Component Value Date/Time   COLORURINE AMBER (A) 04/19/2020 1934   APPEARANCEUR CLOUDY (A) 04/19/2020 1934   APPEARANCEUR Cloudy (A) 06/20/2019 1141   LABSPEC 1.016 04/19/2020 1934   LABSPEC 1.012 05/25/2013 1506   PHURINE 8.0 04/19/2020 1934   GLUCOSEU NEGATIVE 04/19/2020 1934   GLUCOSEU Negative 05/25/2013 1506   HGBUR MODERATE (A) 04/19/2020 1934   BILIRUBINUR NEGATIVE 04/19/2020 1934   BILIRUBINUR Negative 06/20/2019 1141   BILIRUBINUR Negative 05/25/2013 [place] 04/19/2020 1934   PROTEINUR >=300 (A) 04/19/2020 1934   UROBILINOGEN 0.2 10/03/2018 1032   NITRITE NEGATIVE 04/19/2020 1934   LEUKOCYTESUR TRACE (A) 04/19/2020 1934   LEUKOCYTESUR Negative 05/25/2013 1506   Sepsis Labs: @LABRCNTIP (procalcitonin:4,lacticidven:4) ) Recent Results (from the past 240 hour(s))  Resp Panel by RT-PCR (Flu A&B, Covid) Nasopharyngeal Swab     Status: None   Collection Time: 05/07/20  8:26 AM   Specimen: Nasopharyngeal Swab; Nasopharyngeal(NP) swabs in vial transport medium  Result Value Ref Range Status   SARS Coronavirus 2 by RT PCR NEGATIVE NEGATIVE Final    Comment: (NOTE) SARS-CoV-2 target nucleic acids are NOT DETECTED.  The SARS-CoV-2 RNA is generally detectable in upper respiratory specimens during the acute phase of infection. The lowest concentration of SARS-CoV-2 viral copies this assay can detect is 138 copies/mL. A negative result does not preclude SARS-Cov-2 infection and should not be used as the sole basis for treatment or other patient management decisions. A negative result may occur with  improper specimen collection/handling, submission of specimen other than nasopharyngeal swab, presence of viral mutation(s) within  the areas targeted by this assay, and inadequate number of viral copies(<138 copies/mL). A negative result must be combined with clinical observations, patient history, and epidemiological information. The expected result is Negative.  Fact Sheet for Patients:  EntrepreneurPulse.com.au  Fact Sheet for Healthcare Providers:  IncredibleEmployment.be  This test is no t yet approved or cleared by the [place] FDA and  has been authorized for detection and/or diagnosis of SARS-CoV-2 by  FDA under an Emergency Use Authorization (EUA). This EUA will remain  in effect (meaning this test can be used) for the duration of the COVID-19 declaration under Section 564(b)(1) of the Act, 21 U.S.C.section 360bbb-3(b)(1), unless the authorization is terminated  or revoked sooner.       Influenza A by PCR NEGATIVE NEGATIVE Final   Influenza B by PCR NEGATIVE NEGATIVE Final    Comment: (NOTE) The Xpert Xpress SARS-CoV-2/FLU/RSV plus assay is intended as an aid in the diagnosis of influenza from Nasopharyngeal swab specimens and should not be used as a sole basis for treatment. Nasal washings and aspirates are unacceptable for Xpert Xpress SARS-CoV-2/FLU/RSV testing.  Fact Sheet for Patients: EntrepreneurPulse.com.au  Fact Sheet for Healthcare Providers: IncredibleEmployment.be  This test is not yet approved or cleared by the [place] FDA and has been authorized for detection and/or diagnosis of SARS-CoV-2 by FDA under an Emergency Use Authorization (EUA). This EUA will remain in effect (meaning this test can be used) for the duration of the COVID-19 declaration under Section 564(b)(1) of the Act, 21 U.S.C. section 360bbb-3(b)(1), unless the authorization is terminated or revoked.  Performed at [facility], [address] [place], [place] [address]      Radiological Exams on Admission: CT Head Wo  Contrast  Result Date: 05/07/2020 CLINICAL DATA:  Mental status changes of unknown etiology.  Hypoxia. EXAM: CT HEAD WITHOUT CONTRAST TECHNIQUE: Contiguous axial images were obtained from the base of the skull through the vertex without intravenous contrast. COMPARISON:  05/27/2019 FINDINGS: Brain: No acute CT finding. Mild age related volume loss. Mild chronic small-vessel change of the hemispheric deep white matter. No mass lesion, hemorrhage, hydrocephalus or extra-axial collection. Vascular: No abnormal vascular finding. Skull: Negative Sinuses/Orbits: Clear/normal Other: None IMPRESSION: No acute or reversible finding. Mild age related volume loss. Mild chronic small-vessel change of the hemispheric deep white matter. Electronically Signed   By: Nelson Chimes M.D.   On: 05/07/2020 08:24   DG Chest Portable 1 View  Result Date: 05/07/2020 CLINICAL DATA:  Weakness.  Shortness of breath. EXAM: PORTABLE CHEST 1 VIEW COMPARISON:  04/19/2020 FINDINGS: Heart size upper limits of normal. Chronic aortic atherosclerotic calcification. Patchy bilateral lung density persists, similar to the study 3 weeks ago. Findings could relate to bronchitis and pneumonia. No dense consolidation or lobar collapse. No effusion to suggest congestive heart failure. IMPRESSION: Persistent patchy bilateral lung density, similar to the study 3 weeks ago. Findings could relate to bronchitis and pneumonia. No dense consolidation or lobar collapse. Electronically Signed   By: Nelson Chimes M.D.   On: 05/07/2020 08:22     EKG: I have personally reviewed.  Not done in ED, will get one.   Assessment/Plan Principal Problem:   HCAP (healthcare-associated pneumonia) Active Problems:   Cerebral palsy ([facility])   BPH (benign prostatic hyperplasia)   Hyperlipidemia   Depression with anxiety   Acute metabolic encephalopathy   HCAP (healthcare-associated pneumonia): Patient has leukocytosis, chest x-ray showed bilateral patchy  infiltration, consistent with HCAP. Patient does not have fever, does not meet criteria for sepsis. Lactic acid normal 1.2. Currently blood pressure 91/68  - Will admit to med-surg bed as inpt - IV Vancomycin and cefepime (pt also received one dose of azithromycin) - Mucinex for cough  - Bronchodilators - Urine legionella and S. pneumococcal antigen - Follow up blood culture x2, sputum culture  - IVF: 2L of LRS bolus in ED, followed by 75 mL per hour   Cerebral palsy (  [place]) -no acute issues  BPH (benign prostatic hyperplasia) -Alfuzosin   Hyperlipidemia -Lipitor  Depression with anxiety -Continue home medications  Acute metabolic encephalopathy: Likely due to ongoing infection. CT head negative. -Frequent neurochecks         DVT ppx:  SQ Lovenox Code Status: Full code per his niece Family Communication: Yes, patient's niece by phone   Disposition Plan:  Anticipate discharge back to previous environment Consults called: None Admission status: Med-surg bed as inpt     Status is: Inpatient  Remains inpatient appropriate because:Inpatient level of care appropriate due to severity of illness.  Patient has multiple comorbidities, now presents with HCAP, altered mental status.  Patient also had hypotension.  His presentation is highly complicated.  Patient is at high risk of deteriorating.  Will need to be treated in the hospital for least 2 days   Dispo: The patient is from: Group home              Anticipated d/c is to: Group home              Anticipated d/c date is: 2 days              Patient currently is not medically stable to d/c.          Date of Service 05/07/2020    [facility]   If 7PM-7AM, please contact night-coverage [email or web address] 05/07/2020, 3:10 PM

## 2020-05-07 NOTE — ED Notes (Signed)
Legal guardian updated on how he is doing and the progress of where he is going.

## 2020-05-07 NOTE — ED Triage Notes (Signed)
Pt arrives via EMS from [facility] home for for AMS and SOB

## 2020-05-07 NOTE — Progress Notes (Signed)
CODE SEPSIS - PHARMACY COMMUNICATION  **Broad Spectrum Antibiotics should be administered within 1 hour of Sepsis diagnosis**  Time Code Sepsis Called/Page Received: 9622  Antibiotics Ordered: vancomycin / cefepime / azithromycin  Time of 1st antibiotic administration: 1031  Additional action taken by pharmacy: Spoke with RN at 1025. Preparing to administer antibiotics at that time  Benita Gutter 05/07/2020  9:45 AM

## 2020-05-07 NOTE — ED Notes (Signed)
Pt presents to ED via EMS from group home with concerns of hypoxia and AMS. Per EMS staff at residence is not sure of baseline but pt is very harsd to understand when speaking so this RN is not able to validate mentals tatus at this time. Pt is acting appropriate at this time. Per EMS pt was 83% on RA when pt on monitor pt has a current O2 sat of 100% on RA with pulse ox on ear. Pt does wear a brief so pt appears to be incontinent, external male cathter placed at this time. Pt is not febrile at this time. Pt also does not appear to have a cough is having no work of breathing at this time. NAD noted. VSS. EMS states pt was hypotensive on arrival, pt had a 18G in place with a 500 ml NS bolus and pt arrived with a normotensive BP.

## 2020-05-07 NOTE — ED Notes (Signed)
Attempted to call report on pt. RN did not pick up phone. Will attempt to call again.

## 2020-05-08 DIAGNOSIS — K921 Melena: Secondary | ICD-10-CM

## 2020-05-08 LAB — HEMOGLOBIN AND HEMATOCRIT, BLOOD
HCT: 26.6 % — ABNORMAL LOW (ref 39.0–52.0)
HCT: 23.7 % — ABNORMAL LOW (ref 39.0–52.0)
Hemoglobin: 8.8 g/dL — ABNORMAL LOW (ref 13.0–17.0)
Hemoglobin: 7.8 g/dL — ABNORMAL LOW (ref 13.0–17.0)

## 2020-05-08 LAB — PREPARE RBC (CROSSMATCH)

## 2020-05-08 LAB — CBC
HCT: 28.2 % — ABNORMAL LOW (ref 39.0–52.0)
Hemoglobin: 9.2 g/dL — ABNORMAL LOW (ref 13.0–17.0)
MCH: 31.9 pg (ref 26.0–34.0)
MCHC: 32.6 g/dL (ref 30.0–36.0)
MCV: 97.9 fL (ref 80.0–100.0)
Platelets: 156 10*3/uL (ref 150–400)
RBC: 2.88 MIL/uL — ABNORMAL LOW (ref 4.22–5.81)
RDW: 13.2 % (ref 11.5–15.5)
WBC: 18.7 10*3/uL — ABNORMAL HIGH (ref 4.0–10.5)
nRBC: 0 % (ref 0.0–0.2)

## 2020-05-08 LAB — BASIC METABOLIC PANEL
Anion gap: 11 (ref 5–15)
BUN: 23 mg/dL (ref 8–23)
CO2: 22 mmol/L (ref 22–32)
Calcium: 8 mg/dL — ABNORMAL LOW (ref 8.9–10.3)
Chloride: 107 mmol/L (ref 98–111)
Creatinine, Ser: 0.44 mg/dL — ABNORMAL LOW (ref 0.61–1.24)
GFR, Estimated: >60 mL/min (ref >60)
Glucose, Bld: 82 mg/dL (ref 70–99)
Potassium: 3.4 mmol/L — ABNORMAL LOW (ref 3.5–5.1)
Sodium: 140 mmol/L (ref 135–145)

## 2020-05-08 LAB — PROCALCITONIN: Procalcitonin: 0.7 ng/mL

## 2020-05-08 LAB — HEMOGLOBIN: Hemoglobin: 9 g/dL — ABNORMAL LOW (ref 13.0–17.0)

## 2020-05-08 MED ORDER — KCL IN DEXTROSE-NACL 20-5-0.45 MEQ/L-%-% IV SOLN
INTRAVENOUS | Status: DC
  Filled 2020-05-08 (×2): qty 1000

## 2020-05-08 MED ORDER — SODIUM CHLORIDE 0.9 % IV SOLN
INTRAVENOUS | Status: DC | PRN
  Administered 2020-05-08: 250 mL via INTRAVENOUS
  Administered 2020-05-08 (×2): 500 mL via INTRAVENOUS

## 2020-05-08 MED ORDER — DEXTROSE-NACL 5-0.45 % IV SOLN: INTRAVENOUS | Status: DC

## 2020-05-08 MED ORDER — POTASSIUM CHLORIDE 10 MEQ/100ML IV SOLN
10.0000 meq | INTRAVENOUS | Status: DC
  Administered 2020-05-08 (×4): 10 meq via INTRAVENOUS
  Filled 2020-05-08 (×3): qty 100

## 2020-05-08 MED ORDER — POTASSIUM CHLORIDE 10 MEQ/100ML IV SOLN: 10.0000 meq | Freq: Once | INTRAVENOUS | Status: DC

## 2020-05-08 MED ORDER — LACTATED RINGERS IV BOLUS
1000.0000 mL | Freq: Once | INTRAVENOUS | Status: AC
  Administered 2020-05-08: 1000 mL via INTRAVENOUS

## 2020-05-08 MED ORDER — SODIUM CHLORIDE 0.9% IV SOLUTION: Freq: Once | INTRAVENOUS | Status: AC

## 2020-05-08 MED ORDER — SODIUM CHLORIDE 0.9% IV SOLUTION: Freq: Once | INTRAVENOUS | Status: DC

## 2020-05-08 MED ORDER — FUROSEMIDE 10 MG/ML IJ SOLN
20.0000 mg | Freq: Once | INTRAMUSCULAR | Status: AC
  Administered 2020-05-08: 20 mg via INTRAVENOUS
  Filled 2020-05-08: qty 4

## 2020-05-08 MED ORDER — SODIUM CHLORIDE 0.9 % IV SOLN
3.0000 g | Freq: Four times a day (QID) | INTRAVENOUS | Status: DC
  Administered 2020-05-08 – 2020-05-11 (×12): 3 g via INTRAVENOUS
  Filled 2020-05-08 (×5): qty 8
  Filled 2020-05-08 (×3): qty 3
  Filled 2020-05-08 (×3): qty 8
  Filled 2020-05-08 (×3): qty 3
  Filled 2020-05-08: qty 8

## 2020-05-08 MED ORDER — ORAL CARE MOUTH RINSE
15.0000 mL | Freq: Two times a day (BID) | OROMUCOSAL | Status: DC
  Administered 2020-05-08 – 2020-05-17 (×17): 15 mL via OROMUCOSAL

## 2020-05-08 MED ORDER — PANTOPRAZOLE SODIUM 40 MG IV SOLR
40.0000 mg | Freq: Two times a day (BID) | INTRAVENOUS | Status: DC
  Administered 2020-05-08 – 2020-05-13 (×11): 40 mg via INTRAVENOUS
  Filled 2020-05-08 (×10): qty 40

## 2020-05-08 MED ORDER — SODIUM CHLORIDE 0.9 % IV BOLUS
500.0000 mL | Freq: Once | INTRAVENOUS | Status: AC
  Administered 2020-05-08: 500 mL via INTRAVENOUS

## 2020-05-08 NOTE — Progress Notes (Signed)
[place] Several episodes of BRBPR,   One brief episode of altered mental status and paleness that was short lived and remaned hemodynamically stable. Over 1 gm drop in HGB, 1 unit PRBC's ordered and transfused.  Initially ordered bleeding scan but no further episodes occurred so this has been cancelled.  GI consult is needed.

## 2020-05-08 NOTE — Progress Notes (Addendum)
PROGRESS NOTE    [name]  [id number] DOB: 05/11/1940 DOA: 05/07/2020 PCP: Steele Sizer, MD   Chief complaint.  Shortness of breath. Brief Narrative:  [name] is a 80 y.o. male with medical history significant of cerebral palsy, nonverbal at baseline, hyperlipidemia, wheelchair-bound, prostate cancer, BPH, lymphedema, PAD, depression, anxiety, who presents with SOB.  He had a transient hypoxemia, later improved.  Patient also had a significant leukocytosis, he was diagnosed with healthcare associated pneumonia, was treated with cefepime and vancomycin. Patient developed fresh red blood per rectum on 1/15, GI consult is obtained.  Patient also received 1 unit of PRBC overnight.   Assessment & Plan:   Principal Problem:   HCAP (healthcare-associated pneumonia) Active Problems:   Cerebral palsy (HCC)   BPH (benign prostatic hyperplasia)   Hyperlipidemia   Depression with anxiety   Acute metabolic encephalopathy  #1.  Healthcare associated pneumonia. Initial procalcitonin level 0.96.  Chest x-ray showed bilateral lower lobe patchy infiltrates.  Similar to 3 weeks ago. I spoke with the patient legal guardian, his niece, patient is wheelchair-bound at baseline current and living in a group home.  He can talk, but hard to understand.  He also had significant problem with swallowing, he is currently on a pured diet. Patient condition most likely is due to aspiration pneumonia.  I will change antibiotics over to Unasyn.  Discontinue cefepime and vancomycin. Patient did not meet sepsis criteria.  Therefore, sepsis is ruled out. We will consult speech therapy. N.p.o. for now until patient mental status improved.  Continue maintenance fluids. BNP 232 noted, no evidence of volume overload. Will give 20mg  iv lasix since pt also received PRBC.   2.  Acute blood loss anemia. Fresh red blood per rectum. Received 1 unit PRBC last night.  Will consult GI.  3.  Acute  metabolic encephalopathy. Patient is seem to have worsening mental status.  Continue to follow. CT head did not show acute intracranial changes.  4. Hypokalemia. Added potassium into IVF.   Discussed with patient and niece, patient condition appears to be critical, high risk of mortality.  But is still full code.  Addendum: Seen by speech therapist, Patient is more awake now. Will start diet and discontinue fluids.     DVT prophylaxis: Lovenox Code Status: Full code Family Communication: Discussed with patient and niece Disposition Plan:  .   Status is: Inpatient  Remains inpatient appropriate because:Inpatient level of care appropriate due to severity of illness   Dispo: The patient is from: Home              Anticipated d/c is to: Home              Anticipated d/c date is: 3 days              Patient currently is not medically stable to d/c.        I/O last 3 completed shifts: In: 19 [Blood:390; IV Piggyback:350] Out: 200 [Urine:200] Total I/O In: 74 [P.O.:120; Blood:300] Out: -      Consultants:   GI  Procedures: None  Antimicrobials: Unasyn.  Subjective: Patient very drowsy, minimally responsive. Does not seem to have any short of breath. No fever. Not able to perform review of system.  Objective: Vitals:   05/08/20 0816 05/08/20 0840 05/08/20 0917 05/08/20 0939  BP: (!) 183/150 (!) 93/57 104/76 (!) 116/56  Pulse: (!) 57 67 (!) 52 (!) 58  Resp: 18  19 16   Temp: 97.7  F (36.5 C)  97.7 F (36.5 C) 97.9 F (36.6 C)  TempSrc: Oral  Oral   SpO2: 97%  100% (!) 87%  Weight:        Intake/Output Summary (Last 24 hours) at 05/08/2020 1203 Last data filed at 05/08/2020 1045 Gross per 24 hour  Intake 810 ml  Output 200 ml  Net 610 ml   Filed Weights   05/07/20 0737  Weight: 61.5 kg    Examination:  General exam: Chronically ill-appearing, minimally responsive. Respiratory system: Clear to auscultation. Respiratory effort  normal. Cardiovascular system: S1 & S2 heard, RRR. No JVD, murmurs, rubs, gallops or clicks. No pedal edema. Gastrointestinal system: Abdomen is nondistended, soft and nontender. No organomegaly or masses felt. Normal bowel sounds heard. Central nervous system: Drowsy, opening eyes only. No focal neurological deficits. Extremities: Symmetric  Skin: No rashes, lesions or ulcers     Data Reviewed: I have personally reviewed following labs and imaging studies  CBC: Recent Labs  Lab 05/07/20 0740 05/08/20 0249  WBC 24.1* 18.7*  NEUTROABS 21.1*  --   HGB 10.6* 9.2*  HCT 32.3* 28.2*  MCV 96.4 97.9  PLT 201 967   Basic Metabolic Panel: Recent Labs  Lab 05/07/20 0740 05/08/20 0249  NA 138 140  K 3.5 3.4*  CL 103 107  CO2 27 22  GLUCOSE 98 82  BUN 25* 23  CREATININE 0.72 0.44*  CALCIUM 8.6* 8.0*   GFR: Estimated Creatinine Clearance: 67.3 mL/min (A) (by C-G formula based on SCr of 0.44 mg/dL (L)). Liver Function Tests: Recent Labs  Lab 05/07/20 0740  AST 14*  ALT 9  ALKPHOS 93  BILITOT 1.1  PROT 6.3*  ALBUMIN 2.7*   No results for input(s): LIPASE, AMYLASE in the last 168 hours. No results for input(s): AMMONIA in the last 168 hours. Coagulation Profile: No results for input(s): INR, PROTIME in the last 168 hours. Cardiac Enzymes: No results for input(s): CKTOTAL, CKMB, CKMBINDEX, TROPONINI in the last 168 hours. BNP (last 3 results) No results for input(s): PROBNP in the last 8760 hours. HbA1C: No results for input(s): HGBA1C in the last 72 hours. CBG: No results for input(s): GLUCAP in the last 168 hours. Lipid Profile: No results for input(s): CHOL, HDL, LDLCALC, TRIG, CHOLHDL, LDLDIRECT in the last 72 hours. Thyroid Function Tests: No results for input(s): TSH, T4TOTAL, FREET4, T3FREE, THYROIDAB in the last 72 hours. Anemia Panel: No results for input(s): VITAMINB12, FOLATE, FERRITIN, TIBC, IRON, RETICCTPCT in the last 72 hours. Sepsis Labs: Recent  Labs  Lab 05/07/20 0740 05/08/20 0249  PROCALCITON 0.96 0.70  LATICACIDVEN 1.2  --     Recent Results (from the past 240 hour(s))  Culture, blood (routine x 2)     Status: None (Preliminary result)   Collection Time: 05/07/20  7:40 AM   Specimen: BLOOD  Result Value Ref Range Status   Specimen Description BLOOD LEFT ANTECUBITAL  Final   Special Requests   Final    BOTTLES DRAWN AEROBIC AND ANAEROBIC Blood Culture results may not be optimal due to an excessive volume of blood received in culture bottles   Culture   Final    NO GROWTH 1 DAY Performed at [facility], [address] [place], [place] [address]    Report Status PENDING  Incomplete  Culture, blood (routine x 2)     Status: None (Preliminary result)   Collection Time: 05/07/20  7:40 AM   Specimen: BLOOD  Result Value Ref Range Status  Specimen Description BLOOD RIGHT ANTECUBITAL  Final   Special Requests   Final    BOTTLES DRAWN AEROBIC AND ANAEROBIC Blood Culture adequate volume   Culture   Final    NO GROWTH 1 DAY Performed at [facility], [address] [place], [place] [address]    Report Status PENDING  Incomplete  Resp Panel by RT-PCR (Flu A&B, Covid) Nasopharyngeal Swab     Status: None   Collection Time: 05/07/20  8:26 AM   Specimen: Nasopharyngeal Swab; Nasopharyngeal(NP) swabs in vial transport medium  Result Value Ref Range Status   SARS Coronavirus 2 by RT PCR NEGATIVE NEGATIVE Final    Comment: (NOTE) SARS-CoV-2 target nucleic acids are NOT DETECTED.  The SARS-CoV-2 RNA is generally detectable in upper respiratory specimens during the acute phase of infection. The lowest concentration of SARS-CoV-2 viral copies this assay can detect is 138 copies/mL. A negative result does not preclude SARS-Cov-2 infection and should not be used as the sole basis for treatment or other patient management decisions. A negative result may occur with  improper specimen collection/handling,  submission of specimen other than nasopharyngeal swab, presence of viral mutation(s) within the areas targeted by this assay, and inadequate number of viral copies(<138 copies/mL). A negative result must be combined with clinical observations, patient history, and epidemiological information. The expected result is Negative.  Fact Sheet for Patients:  EntrepreneurPulse.com.au  Fact Sheet for Healthcare Providers:  IncredibleEmployment.be  This test is no t yet approved or cleared by the [place] FDA and  has been authorized for detection and/or diagnosis of SARS-CoV-2 by FDA under an Emergency Use Authorization (EUA). This EUA will remain  in effect (meaning this test can be used) for the duration of the COVID-19 declaration under Section 564(b)(1) of the Act, 21 U.S.C.section 360bbb-3(b)(1), unless the authorization is terminated  or revoked sooner.       Influenza A by PCR NEGATIVE NEGATIVE Final   Influenza B by PCR NEGATIVE NEGATIVE Final    Comment: (NOTE) The Xpert Xpress SARS-CoV-2/FLU/RSV plus assay is intended as an aid in the diagnosis of influenza from Nasopharyngeal swab specimens and should not be used as a sole basis for treatment. Nasal washings and aspirates are unacceptable for Xpert Xpress SARS-CoV-2/FLU/RSV testing.  Fact Sheet for Patients: EntrepreneurPulse.com.au  Fact Sheet for Healthcare Providers: IncredibleEmployment.be  This test is not yet approved or cleared by the [place] FDA and has been authorized for detection and/or diagnosis of SARS-CoV-2 by FDA under an Emergency Use Authorization (EUA). This EUA will remain in effect (meaning this test can be used) for the duration of the COVID-19 declaration under Section 564(b)(1) of the Act, 21 U.S.C. section 360bbb-3(b)(1), unless the authorization is terminated or revoked.  Performed at [facility], [address], [place], [place] [address]          Radiology Studies: CT Head Wo Contrast  Result Date: 05/07/2020 CLINICAL DATA:  Mental status changes of unknown etiology.  Hypoxia. EXAM: CT HEAD WITHOUT CONTRAST TECHNIQUE: Contiguous axial images were obtained from the base of the skull through the vertex without intravenous contrast. COMPARISON:  05/27/2019 FINDINGS: Brain: No acute CT finding. Mild age related volume loss. Mild chronic small-vessel change of the hemispheric deep white matter. No mass lesion, hemorrhage, hydrocephalus or extra-axial collection. Vascular: No abnormal vascular finding. Skull: Negative Sinuses/Orbits: Clear/normal Other: None IMPRESSION: No acute or reversible finding. Mild age related volume loss. Mild chronic small-vessel change of the hemispheric deep white  matter. Electronically Signed   By: Nelson Chimes M.D.   On: 05/07/2020 08:24   DG Chest Portable 1 View  Result Date: 05/07/2020 CLINICAL DATA:  Weakness.  Shortness of breath. EXAM: PORTABLE CHEST 1 VIEW COMPARISON:  04/19/2020 FINDINGS: Heart size upper limits of normal. Chronic aortic atherosclerotic calcification. Patchy bilateral lung density persists, similar to the study 3 weeks ago. Findings could relate to bronchitis and pneumonia. No dense consolidation or lobar collapse. No effusion to suggest congestive heart failure. IMPRESSION: Persistent patchy bilateral lung density, similar to the study 3 weeks ago. Findings could relate to bronchitis and pneumonia. No dense consolidation or lobar collapse. Electronically Signed   By: Nelson Chimes M.D.   On: 05/07/2020 08:22        Scheduled Meds: . sodium chloride   Intravenous Once  . alfuzosin  10 mg Oral Daily  . ascorbic acid  500 mg Oral Daily  . aspirin EC  81 mg Oral Daily  . atorvastatin  40 mg Oral QPM  . calcium-vitamin D  1 tablet Oral BID  . enoxaparin (LOVENOX) injection  40 mg Subcutaneous Q24H  . lubiprostone  24 mcg Oral BID WC  .  pantoprazole (PROTONIX) IV  40 mg Intravenous Q12H  . Ensure Max Protein  1 each Oral BID  . sertraline  100 mg Oral Daily   Continuous Infusions: . sodium chloride 500 mL (05/08/20 0505)  . ceFEPime (MAXIPIME) IV 2 g (05/08/20 0959)  . lactated ringers 20 mL/hr at 05/08/20 0332  . vancomycin 750 mg (05/08/20 0046)     LOS: 1 day    Time spent: 37 minutes    Sharen Hones, MD [facility]   To contact the attending provider between 7A-7P or the covering provider during after hours 7P-7A, please log into the web site [email or web address] and access using universal [place] password for that web site. If you do not have the password, please call the hospital operator.  05/08/2020, 12:04 PM

## 2020-05-08 NOTE — Evaluation (Signed)
Clinical/Bedside Swallow Evaluation Patient Details  Name: [name] MRN: [id number] Date of Birth: [birth year]  Today's Date: 05/08/2020 Time: SLP Start Time (ACUTE ONLY): 7782 SLP Stop Time (ACUTE ONLY): 1345 SLP Time Calculation (min) (ACUTE ONLY): 60 min  Past Medical History:  Past Medical History:  Diagnosis Date  . Anxiety   . Atrophy, disuse, muscle   . BPH (benign prostatic hyperplasia)   . Cerebral palsy ([place])   . Eczema   . Elevated lipids   . Elevated PSA   . Exotropia   . Hyperlipidemia   . Kidney stone   . Leukopenia   . Lower extremity edema    lymphedema  . Mental retardation   . Over weight   . Prostate cancer ([place])   . Venous insufficiency   . Vitamin D deficiency   . Wheelchair dependence    Past Surgical History:  Past Surgical History:  Procedure Laterality Date  . CYSTOSCOPY/URETEROSCOPY/HOLMIUM LASER/STENT PLACEMENT Right 08/14/2017   Procedure: CYSTOSCOPY/URETEROSCOPY/HOLMIUM LASER/STENT PLACEMENT;  Surgeon: Abbie Sons, MD;  Location: [facility] ORS;  Service: Urology;  Laterality: Right;  . leg circulation surgery Right   . PROSTATE SURGERY  11/10/2008   BRACHYTHERAPY   HPI:  Pt is a 80 y.o. male with medical history significant of cerebral palsy, nonverbal at baseline, hyperlipidemia, wheelchair-bound, prostate cancer, BPH, lymphedema, PAD, depression, anxiety, who presents with SOB.  He had a transient hypoxemia, later improved.  Patient also had a significant leukocytosis, he was diagnosed with healthcare associated pneumonia, was treated with cefepime and vancomycin.  The patient was reported to have rectal bleeding with bright red blood per rectum 3 times; GI following.   Assessment / Plan / Recommendation Clinical Impression  Pt appears to present w/ grossly adequate oropharyngeal phase swallow w/ No overt pharyngeal phase dysphagia noted, No gross, overt neuromuscular deficits noted. Oral phase c/b min increased Time for mastication  d/t Edentulous status and attention needed for oral management of the increased textured boluses. Pt consumed po trials w/ No immediate, overt, clinical s/s of aspiration during po trials. Pt appears at reduced risk for aspiration following general aspiration precautions, given feeding Support, and w/ a modified textured foods for easier mastication sec. to Edentulous status -- pt does have declined mental status Baseline. During po trials, pt consumed all consistencies w/ no overt coughing, decline in vocal quality, or change in respiratory presentation during/post trials. SLP Pinched straw to limit gulping, fast sips. Oral phase appeared [facility] w/ timely bolus management of liquids and purees, but w/ increased oral phase time for mashing/gumming of increased textured trials. Control of bolus propulsion for A-P transfer for swallowing and oral clearing achieved w/ all trial consistencies. Cursory OM Exam appeared [facility] for bolus management w/ no gross unilateral weakness noted. Speech mumbled, low volume -- suspect Baseline. Pt required full feeding support. Recommend a Minced consistency diet w/ moistened foods; Thin liquids. Monitor straw drinking for Smaller sips, slowly. Recommend general aspiration precautions, Pills Whole vs CRUSHED in Puree for safer swallowing. Education given on Pills in Puree; food consistencies; general aspiration precautions to NSG. NSG to reconsult if any new needs arise. MD/NSG agreed SLP Visit Diagnosis: Dysphagia, oral phase (R13.11) (baseline mental status decline; CP)    Aspiration Risk  Mild aspiration risk;Risk for inadequate nutrition/hydration (reduced following precautions)    Diet Recommendation  Minced foods diet; moistened well; Thin liquids -- monitor Straw use and limit gulping, fast drinking. General aspiration precautions; Upright, midline positioning for eating/drinking. Support feeding  at meals  Medication Administration: Whole meds with puree (vs need to Crush in  Puree for safer swallowing)    Other  Recommendations Recommended Consults:  (Dietician f/u) Oral Care Recommendations: Oral care BID;Oral care before and after PO;Staff/trained caregiver to provide oral care Other Recommendations:  (n/a)   Follow up Recommendations None      Frequency and Duration  (n/a)   (n/a)       Prognosis Prognosis for Safe Diet Advancement: Fair Barriers to Reach Goals: Cognitive deficits;Language deficits;Behavior;Time post onset;Severity of deficits      Swallow Study   General Date of Onset: 05/07/20 HPI: Pt is a 80 y.o. male with medical history significant of cerebral palsy, nonverbal at baseline, hyperlipidemia, wheelchair-bound, prostate cancer, BPH, lymphedema, PAD, depression, anxiety, who presents with SOB.  He had a transient hypoxemia, later improved.  Patient also had a significant leukocytosis, he was diagnosed with healthcare associated pneumonia, was treated with cefepime and vancomycin.  The patient was reported to have rectal bleeding with bright red blood per rectum 3 times; GI following. Type of Study: Bedside Swallow Evaluation Previous Swallow Assessment: none Diet Prior to this Study: Regular;Thin liquids (npo briefly) Temperature Spikes Noted: No (wbc 18.7) Respiratory Status: Room air History of Recent Intubation: No Behavior/Cognition: Alert;Cooperative;Pleasant mood;Distractible;Requires cueing;Confused (not oriented except to name) Oral Cavity Assessment: Within Functional Limits Oral Care Completed by SLP: Yes Oral Cavity - Dentition: Edentulous Vision:  (n/a) Self-Feeding Abilities: Total assist Patient Positioning: Upright in bed (needed positioning support for midline, upright) Baseline Vocal Quality: Low vocal intensity (mumbled speech, gravely) Volitional Cough: Cognitively unable to elicit Volitional Swallow: Unable to elicit    Oral/Motor/Sensory Function Overall Oral Motor/Sensory Function:  (appeared [facility] for bolus  management; no unilateral weakness)   Ice Chips Ice chips: Within functional limits Presentation: Spoon (fed; 2 trials)   Thin Liquid Thin Liquid: Within functional limits Presentation: Straw (8 trials) Other Comments: multiple sips intermittently -- needed pinched straw to slow down    Nectar Thick Nectar Thick Liquid: Not tested   Honey Thick Honey Thick Liquid: Not tested   Puree Puree: Within functional limits Presentation: Spoon (fed; 6 trials) Other Comments: adequate   Solid     Solid: Impaired Presentation: Spoon (fed; 2 trials) Oral Phase Impairments: Impaired mastication (edentulous) Oral Phase Functional Implications: Prolonged oral transit;Impaired mastication Pharyngeal Phase Impairments:  (none) Other Comments: did best w/ foods mashed down       Orinda Kenner, MS, [employer] Speech Language Pathologist [facility] [phone] Harjit Leider 05/08/2020,2:50 PM

## 2020-05-08 NOTE — Progress Notes (Signed)
Rectal bleeding x 3. On-cal notified of symptoms. STAT H & H and type and screen ordered.

## 2020-05-08 NOTE — Consult Note (Signed)
Lucilla Lame, MD [facility]  [address] [place], [place] [address] Phone: [phone] Fax : [phone]  Consultation  Referring Provider:     Dr. Roosevelt Locks Primary Care Physician:  Steele Sizer, MD Primary Gastroenterologist: Althia Forts         Reason for Consultation:     Rectal bleeding  Date of Admission:  05/07/2020 Date of Consultation:  05/08/2020         HPI:   [name] is a 80 y.o. male who came in with pneumonia and has a history of cerebral palsy hyperlipidemia prostate cancer lymphedema depression anxiety and was admitted with shortness of breath.  The patient was reported to have rectal bleeding with bright red blood per rectum 3 times.  The patient had a stat H&H and his hemoglobin yesterday was 10.6 and was down to 9.2 at 2:49 am.  The patient had a repeat hemoglobin at 1251 today that was 9.0.  The patient denies any abdominal pain but is not a very good historian.  He knows that he is at the hospital and he also knows his name but he thinks it is 74.  The patient was ordered a bleeding scan but since he had no further episodes it was canceled.  A GI consult was then called.  Past Medical History:  Diagnosis Date  . Anxiety   . Atrophy, disuse, muscle   . BPH (benign prostatic hyperplasia)   . Cerebral palsy ([place])   . Eczema   . Elevated lipids   . Elevated PSA   . Exotropia   . Hyperlipidemia   . Kidney stone   . Leukopenia   . Lower extremity edema    lymphedema  . Mental retardation   . Over weight   . Prostate cancer ([place])   . Venous insufficiency   . Vitamin D deficiency   . Wheelchair dependence     Past Surgical History:  Procedure Laterality Date  . CYSTOSCOPY/URETEROSCOPY/HOLMIUM LASER/STENT PLACEMENT Right 08/14/2017   Procedure: CYSTOSCOPY/URETEROSCOPY/HOLMIUM LASER/STENT PLACEMENT;  Surgeon: Abbie Sons, MD;  Location: [facility] ORS;  Service: Urology;  Laterality: Right;  . leg circulation surgery Right   . PROSTATE SURGERY   11/10/2008   BRACHYTHERAPY    Prior to Admission medications   Medication Sig Start Date End Date Taking? Authorizing Provider  alfuzosin (UROXATRAL) 10 MG 24 hr tablet Take 1 tablet (10 mg total) by mouth daily. 03/01/20  Yes Sowles, Drue Stager, MD  ARTIFICIAL TEARS 0.2-0.2-1 % SOLN PLACE 2 DROPS INTO EYE 2 TIMES A DAY AS NEEDED 07/24/19  Yes Sowles, Drue Stager, MD  ascorbic acid (VITAMIN C) 500 MG tablet TAKE ONE TABLET BY MOUTH ONCE DAILY Patient taking differently: Take 500 mg by mouth daily. 03/15/20  Yes Sowles, Drue Stager, MD  aspirin (ASPIR-LOW) 81 MG EC tablet TAKE 1 TABLET BY MOUTH ONCE DAILY Patient taking differently: Take 81 mg by mouth daily. TAKE 1 TABLET BY MOUTH ONCE DAILY 11/18/19  Yes Sowles, Drue Stager, MD  atorvastatin (LIPITOR) 40 MG tablet Take 1 tablet (40 mg total) by mouth every evening. 03/01/20  Yes Sowles, Drue Stager, MD  Calcium Carb-Cholecalciferol (CALCIUM-VITAMIN D) 500-400 MG-UNIT TABS TAKE 1 TABLET BY MOUTH 2 TIMES A DAY 02/16/20  Yes Sowles, Drue Stager, MD  Cranberry Juice Powder 425 MG CAPS TAKE 1 CAPSULE BY MOUTH ONCE DAILY Patient taking differently: Take 425 mg by mouth daily. 11/18/19  Yes Sowles, Drue Stager, MD  lubiprostone (AMITIZA) 24 MCG capsule TAKE 1 CAPSULE BY MOUTH 2 TIMES DAILY  WITH A MEAL Patient taking differently: Take 24 mcg by mouth 2 (two) times daily with a meal. TAKE 1 CAPSULE BY MOUTH 2 TIMES DAILY WITH A MEAL 03/01/20  Yes Sowles, Drue Stager, MD  Polyethyl Glycol-Propyl Glycol (SYSTANE ULTRA) 0.4-0.3 % SOLN Apply 1 drop to eye 2 (two) times daily. Both eyes 06/02/19  Yes Sowles, Drue Stager, MD  sertraline (ZOLOFT) 100 MG tablet Take 1 tablet (100 mg total) by mouth daily. 03/01/20  Yes Sowles, Drue Stager, MD  acetaminophen (TYLENOL) 325 MG tablet TAKE 2 TABLETS (650 MG TOTAL) BY MOUTH EVERY 6 HOURS AS NEEDED FOR MILD PAIN OR HEADACHE. 01/29/20   Sowles, Drue Stager, MD  ALPRAZolam (XANAX) 0.25 MG tablet TAKE ONE TABLET BY MOUTH 2 TIMES A DAY AS NEEDED FOR ANXIETY (WHEN BLOOD  PRESSURE IS VERY HIGH OR ANXIOUS) Patient taking differently: Take 0.25 mg by mouth 2 (two) times daily as needed for anxiety or sleep. TAKE ONE TABLET BY MOUTH 2 TIMES A DAY AS NEEDED FOR ANXIETY (WHEN BLOOD PRESSURE IS VERY HIGH OR ANXIOUS) 06/02/19   Sowles, Drue Stager, MD  augmented betamethasone dipropionate (DIPROLENE-AF) 0.05 % cream APPLY TOPICALLY AS DIRECTED TO RASH AS NEEDED 06/17/19   Steele Sizer, MD  Elastic Bandages & Supports (MEDICAL COMPRESSION STOCKINGS) MISC 1 Units by Does not apply route daily. 06/02/19   Steele Sizer, MD  Incontinence Supply Disposable (DEPEND SILHOUETTE BRIEFS L/XL) MISC 1 each by Does not apply route 2 (two) times daily as needed. 06/02/19   Steele Sizer, MD  Nutritional Supplements (ENSURE NUTRITION SHAKE) LIQD Take 1 each by mouth 2 (two) times daily. 06/02/19   Steele Sizer, MD    Family History  Problem Relation Age of Onset  . Heart attack Brother   . Heart disease Other      Social History   Tobacco Use  . Smoking status: Never Smoker  . Smokeless tobacco: Never Used  Vaping Use  . Vaping Use: Never used  Substance Use Topics  . Alcohol use: No    Alcohol/week: 0.0 standard drinks  . Drug use: No    Allergies as of 05/07/2020  . (No Known Allergies)    Review of Systems:    All systems reviewed and negative except where noted in HPI.   Physical Exam:  Vital signs in last 24 hours: Temp:  [94.4 F (34.7 C)-98.1 F (36.7 C)] 97.4 F (36.3 C) (01/15 1228) Pulse Rate:  [52-113] 77 (01/15 1228) Resp:  [16-22] 18 (01/15 1228) BP: (64-183)/(36-150) 127/50 (01/15 1228) SpO2:  [87 %-100 %] 99 % (01/15 1228)   General:   Pleasant, cooperative in NAD Head:  Normocephalic and atraumatic. Eyes:   No icterus.   Conjunctiva pink. PERRLA. Ears:  Normal auditory acuity. Neck:  Supple; no masses or thyroidomegaly Lungs: Respirations even and unlabored. Lungs clear to auscultation bilaterally.   No wheezes, crackles, or rhonchi.  Heart:   Regular rate and rhythm;  Without murmur, clicks, rubs or gallops Abdomen:  Soft, nondistended, nontender. Normal bowel sounds. No appreciable masses or hepatomegaly.  No rebound or guarding.  Rectal:  Not performed. Msk:  Symmetrical without gross deformities.   Extremities:  Without edema, cyanosis or clubbing. Skin:  Intact without significant lesions or rashes. Cervical Nodes:  No significant cervical adenopathy.  LAB RESULTS: Recent Labs    05/07/20 0740 05/08/20 0249 05/08/20 1251  WBC 24.1* 18.7*  --   HGB 10.6* 9.2* 9.0*  HCT 32.3* 28.2*  --   PLT 201 156  --  BMET Recent Labs    05/07/20 0740 05/08/20 0249  NA 138 140  K 3.5 3.4*  CL 103 107  CO2 27 22  GLUCOSE 98 82  BUN 25* 23  CREATININE 0.72 0.44*  CALCIUM 8.6* 8.0*   LFT Recent Labs    05/07/20 0740  PROT 6.3*  ALBUMIN 2.7*  AST 14*  ALT 9  ALKPHOS 93  BILITOT 1.1   PT/INR No results for input(s): LABPROT, INR in the last 72 hours.  STUDIES: CT Head Wo Contrast  Result Date: 05/07/2020 CLINICAL DATA:  Mental status changes of unknown etiology.  Hypoxia. EXAM: CT HEAD WITHOUT CONTRAST TECHNIQUE: Contiguous axial images were obtained from the base of the skull through the vertex without intravenous contrast. COMPARISON:  05/27/2019 FINDINGS: Brain: No acute CT finding. Mild age related volume loss. Mild chronic small-vessel change of the hemispheric deep white matter. No mass lesion, hemorrhage, hydrocephalus or extra-axial collection. Vascular: No abnormal vascular finding. Skull: Negative Sinuses/Orbits: Clear/normal Other: None IMPRESSION: No acute or reversible finding. Mild age related volume loss. Mild chronic small-vessel change of the hemispheric deep white matter. Electronically Signed   By: Nelson Chimes M.D.   On: 05/07/2020 08:24   DG Chest Portable 1 View  Result Date: 05/07/2020 CLINICAL DATA:  Weakness.  Shortness of breath. EXAM: PORTABLE CHEST 1 VIEW COMPARISON:  04/19/2020 FINDINGS:  Heart size upper limits of normal. Chronic aortic atherosclerotic calcification. Patchy bilateral lung density persists, similar to the study 3 weeks ago. Findings could relate to bronchitis and pneumonia. No dense consolidation or lobar collapse. No effusion to suggest congestive heart failure. IMPRESSION: Persistent patchy bilateral lung density, similar to the study 3 weeks ago. Findings could relate to bronchitis and pneumonia. No dense consolidation or lobar collapse. Electronically Signed   By: Nelson Chimes M.D.   On: 05/07/2020 08:22      Impression / Plan:   Assessment: Principal Problem:   HCAP (healthcare-associated pneumonia) Active Problems:   Cerebral palsy ([place])   BPH (benign prostatic hyperplasia)   Hyperlipidemia   Depression with anxiety   Acute metabolic encephalopathy   [name] is a 80 y.o. y/o male with with rectal bleeding that occurred while in the hospital for shortness of breath and a diagnosis of pneumonia.  The patient had a chest x-ray that showed bilateral lower lobe patchy infiltrates.  [name] repeat hemoglobin from last night till today only went down from 9.2 to 9.0 after transfusion of 1 unit of blood.  Plan:  I would continue to monitor the patient's hemoglobin and signs of any further bleeding.  The patient although pleasant is unlikely to be able to tolerate a prep for colonoscopy but it will have to be attempted if he continues to bleed.  It would be helpful that if the patient has any further signs of any GI bleeding that a bleeding scan be done to isolate and localize the section of the intestines that would need closer investigation to find the source of bleeding.  I discussed this with his nurse.  I will continue to follow with you  Thank you for involving me in the care of this patient.      LOS: 1 day   Lucilla Lame, MD, [facility] 05/08/2020, 1:45 PM,  Pager [phone] 7am-5pm  Check AMION for 5pm -7am coverage and on weekends    Note: This dictation was prepared with Dragon dictation along with smaller phrase technology. Any transcriptional errors that result from this process are  unintentional.

## 2020-05-08 NOTE — Progress Notes (Signed)
Secure chat to Dr. Roosevelt Locks regarding patient vital signs - bolus ordered by MD.    [id number]- secure chat to Dr. Roosevelt Locks who also put Dr. Allen Norris on the conversation - patient continues to have bloody stools and large blood clots per rectum. New orders placed.

## 2020-05-08 NOTE — Progress Notes (Signed)
   05/08/20 0440 05/08/20 0527 05/08/20 0551  Assess: MEWS Score  Temp (!) 94.4 F (34.7 C) (!) 97 F (36.1 C) 98 F (36.7 C)  BP (!) 76/53 (!) 64/36 100/76  Pulse Rate 88 (!) 59 62  Resp 16 (!) 22 17  Level of Consciousness Alert  --   --   SpO2 91 %  --  99 %  O2 Device  --   --  Room Air    05/08/20 0709  Assess: MEWS Score  Temp 98 F (36.7 C)  BP (!) 94/47  Pulse Rate 66  Resp 18  Level of Consciousness  --   SpO2 (!) 89 %  O2 Device Room [employer]

## 2020-05-09 ENCOUNTER — Encounter: Payer: Self-pay | Admitting: Internal Medicine

## 2020-05-09 ENCOUNTER — Inpatient Hospital Stay: Payer: Medicare Other

## 2020-05-09 DIAGNOSIS — D62 Acute posthemorrhagic anemia: Secondary | ICD-10-CM

## 2020-05-09 DIAGNOSIS — K625 Hemorrhage of anus and rectum: Secondary | ICD-10-CM

## 2020-05-09 DIAGNOSIS — Z8719 Personal history of other diseases of the digestive system: Secondary | ICD-10-CM

## 2020-05-09 LAB — CBC WITH DIFFERENTIAL/PLATELET
Abs Immature Granulocytes: 0.05 10*3/uL (ref 0.00–0.07)
Basophils Absolute: 0 10*3/uL (ref 0.0–0.1)
Basophils Relative: 1 %
Eosinophils Absolute: 0.1 10*3/uL (ref 0.0–0.5)
Eosinophils Relative: 1 %
HCT: 26.8 % — ABNORMAL LOW (ref 39.0–52.0)
Hemoglobin: 9.5 g/dL — ABNORMAL LOW (ref 13.0–17.0)
Immature Granulocytes: 1 %
Lymphocytes Relative: 20 %
Lymphs Abs: 1.3 10*3/uL (ref 0.7–4.0)
MCH: 31.8 pg (ref 26.0–34.0)
MCHC: 35.4 g/dL (ref 30.0–36.0)
MCV: 89.6 fL (ref 80.0–100.0)
Monocytes Absolute: 0.4 10*3/uL (ref 0.1–1.0)
Monocytes Relative: 6 %
Neutro Abs: 4.8 10*3/uL (ref 1.7–7.7)
Neutrophils Relative %: 71 %
Platelets: 143 10*3/uL — ABNORMAL LOW (ref 150–400)
RBC: 2.99 MIL/uL — ABNORMAL LOW (ref 4.22–5.81)
RDW: 16.1 % — ABNORMAL HIGH (ref 11.5–15.5)
WBC: 6.7 10*3/uL (ref 4.0–10.5)
nRBC: 0 % (ref 0.0–0.2)

## 2020-05-09 LAB — BASIC METABOLIC PANEL
Anion gap: 6 (ref 5–15)
BUN: 22 mg/dL (ref 8–23)
CO2: 25 mmol/L (ref 22–32)
Calcium: 7.7 mg/dL — ABNORMAL LOW (ref 8.9–10.3)
Chloride: 107 mmol/L (ref 98–111)
Creatinine, Ser: 0.48 mg/dL — ABNORMAL LOW (ref 0.61–1.24)
GFR, Estimated: >60 mL/min (ref >60)
Glucose, Bld: 115 mg/dL — ABNORMAL HIGH (ref 70–99)
Potassium: 2.9 mmol/L — ABNORMAL LOW (ref 3.5–5.1)
Sodium: 138 mmol/L (ref 135–145)

## 2020-05-09 LAB — HEMOGLOBIN AND HEMATOCRIT, BLOOD
HCT: 28.4 % — ABNORMAL LOW (ref 39.0–52.0)
HCT: 28.7 % — ABNORMAL LOW (ref 39.0–52.0)
Hemoglobin: 9.8 g/dL — ABNORMAL LOW (ref 13.0–17.0)
Hemoglobin: 9.6 g/dL — ABNORMAL LOW (ref 13.0–17.0)

## 2020-05-09 LAB — PREPARE RBC (CROSSMATCH)

## 2020-05-09 LAB — MAGNESIUM: Magnesium: 1.6 mg/dL — ABNORMAL LOW (ref 1.7–2.4)

## 2020-05-09 LAB — PROCALCITONIN: Procalcitonin: 0.36 ng/mL

## 2020-05-09 MED ORDER — POTASSIUM CHLORIDE 10 MEQ/100ML IV SOLN
10.0000 meq | INTRAVENOUS | Status: AC
  Administered 2020-05-09 (×4): 10 meq via INTRAVENOUS
  Filled 2020-05-09 (×3): qty 100

## 2020-05-09 MED ORDER — SODIUM CHLORIDE 0.9% IV SOLUTION: Freq: Once | INTRAVENOUS | Status: AC

## 2020-05-09 MED ORDER — ENOXAPARIN SODIUM 40 MG/0.4ML ~~LOC~~ SOLN
40.0000 mg | SUBCUTANEOUS | Status: DC
  Administered 2020-05-13 – 2020-05-17 (×5): 40 mg via SUBCUTANEOUS
  Filled 2020-05-09 (×6): qty 0.4

## 2020-05-09 MED ORDER — TECHNETIUM TC 99M-LABELED RED BLOOD CELLS IV KIT
20.0000 | PACK | Freq: Once | INTRAVENOUS | Status: AC | PRN
  Administered 2020-05-09: 21.96 via INTRAVENOUS

## 2020-05-09 MED ORDER — PEG 3350-KCL-NA BICARB-NACL 420 G PO SOLR
4000.0000 mL | Freq: Once | ORAL | Status: AC
  Administered 2020-05-09: 4000 mL via ORAL
  Filled 2020-05-09: qty 4000

## 2020-05-09 MED ORDER — SODIUM CHLORIDE 0.9 % IV BOLUS: 1000.0000 mL | Freq: Once | INTRAVENOUS | Status: DC

## 2020-05-09 MED ORDER — MAGNESIUM SULFATE 2 GM/50ML IV SOLN
2.0000 g | Freq: Once | INTRAVENOUS | Status: AC
  Administered 2020-05-09: 2 g via INTRAVENOUS
  Filled 2020-05-09: qty 50

## 2020-05-09 NOTE — Progress Notes (Signed)
PROGRESS NOTE    [name]  [id number] DOB: [birth year] DOA: 05/07/2020 PCP: Steele Sizer, MD   Chief complaint. Shortness of breath. Brief Narrative:  Kevin A Andrewsis a 80 y.o.malewith medical history significant ofcerebral palsy,nonverbal at baseline, hyperlipidemia, wheelchair-bound, prostate cancer, BPH, lymphedema, PAD, depression, anxiety, who presents withSOB.  He had a transient hypoxemia, later improved.  Patient also had a significant leukocytosis, he was diagnosed with healthcare associated pneumonia, was treated with cefepime and vancomycin. Patient developed fresh red blood per rectum on 1/15, GI consult is obtained.  Patient also received 1 unit of PRBC overnight.   Assessment & Plan:   Principal Problem:   HCAP (healthcare-associated pneumonia) Active Problems:   Cerebral palsy ([facility])   BPH (benign prostatic hyperplasia)   Hyperlipidemia   Depression with anxiety   Acute metabolic encephalopathy   Hematochezia  #1. Healthcare associated pneumonia. Acute metabolic encephalopathy. Patient condition seems to be improving, mental status is better. Continue current antibiotics. Currently, patient does not have evidence of volume overload. Patient has been evaluated by speech therapy, diet changed, no evidence of aspiration. Patient currently on Unasyn, procalcitonin level continue to drop down.  #2. Acute blood loss anemia Fresh red blood per rectum. Patient continued to have rectal bleeding, bleeding scan did not show any focal accumulation. Planning for colonoscopy by GI. Continue to follow hemoglobin and transfuse as needed.  #3. Hypokalemia and hypomagnesemia. Continue supplement.  DVT prophylaxis: SCDs Code Status: Full Family Communication:  Disposition Plan:  .   Status is: Inpatient  Remains inpatient appropriate because:Inpatient level of care appropriate due to severity of illness   Dispo: The patient is from: Home               Anticipated d/c is to: Home              Anticipated d/c date is: > 3 days              Patient currently is not medically stable to d/c.        I/O last 3 completed shifts: In: 6328.8 [P.O.:240; I.V.:3309; Blood:1410; IV Piggyback:1369.8] Out: 201 [Urine:200; Stool:1] Total I/O In: 771.3 [P.O.:240; Blood:531.3] Out: -      Consultants:   GI  Procedures: None  Antimicrobials: Unasyn  Subjective: Patient had several episodes of rectal bleeding overnight. Seem to be slowing down today. No abdominal pain or nausea vomiting. No fever or chills. No short of breath or cough today. No dysuria or hematuria.  Objective: Vitals:   05/09/20 0530 05/09/20 0545 05/09/20 0601 05/09/20 1000  BP:  95/61 95/65 (!) 90/54  Pulse: 90 89 64 60  Resp: 20 20  18   Temp: 98 F (36.7 C) 98 F (36.7 C) 98.2 F (36.8 C) 98.1 F (36.7 C)  TempSrc: Oral Oral Oral Oral  SpO2: 99%  98% 98%  Weight:        Intake/Output Summary (Last 24 hours) at 05/09/2020 1216 Last data filed at 05/09/2020 1021 Gross per 24 hour  Intake 3549.64 ml  Output 1 ml  Net 3548.64 ml   Filed Weights   05/07/20 0737  Weight: 61.5 kg    Examination:  General exam: Appears calm and comfortable  Respiratory system: Clear to auscultation. Respiratory effort normal. Cardiovascular system: S1 & S2 heard, RRR. No JVD, murmurs, rubs, gallops or clicks. No pedal edema. Gastrointestinal system: Abdomen is nondistended, soft and nontender. No organomegaly or masses felt. Normal bowel sounds heard. Central nervous system: Alert and  oriented x1. No focal neurological deficits. Extremities: Symmetric 5 x 5 power. Skin: No rashes, lesions or ulcers Psychiatry: Mood & affect appropriate.     Data Reviewed: I have personally reviewed following labs and imaging studies  CBC: Recent Labs  Lab 05/07/20 0740 05/08/20 0249 05/08/20 1251 05/08/20 1750 05/08/20 2220 05/09/20 1035  WBC 24.1* 18.7*  --   --   --   6.7  NEUTROABS 21.1*  --   --   --   --  4.8  HGB 10.6* 9.2* 9.0* 8.8* 7.8* 9.5*  HCT 32.3* 28.2*  --  26.6* 23.7* 26.8*  MCV 96.4 97.9  --   --   --  89.6  PLT 201 156  --   --   --  [id number]*   Basic Metabolic Panel: Recent Labs  Lab 05/07/20 0740 05/08/20 0249 05/09/20 1035  NA 138 140 138  K 3.5 3.4* 2.9*  CL 103 107 107  CO2 27 22 25   GLUCOSE 98 82 115*  BUN 25* 23 22  CREATININE 0.72 0.44* 0.48*  CALCIUM 8.6* 8.0* 7.7*  MG  --   --  1.6*   GFR: Estimated Creatinine Clearance: 67.3 mL/min (A) (by C-G formula based on SCr of 0.48 mg/dL (L)). Liver Function Tests: Recent Labs  Lab 05/07/20 0740  AST 14*  ALT 9  ALKPHOS 93  BILITOT 1.1  PROT 6.3*  ALBUMIN 2.7*   No results for input(s): LIPASE, AMYLASE in the last 168 hours. No results for input(s): AMMONIA in the last 168 hours. Coagulation Profile: No results for input(s): INR, PROTIME in the last 168 hours. Cardiac Enzymes: No results for input(s): CKTOTAL, CKMB, CKMBINDEX, TROPONINI in the last 168 hours. BNP (last 3 results) No results for input(s): PROBNP in the last 8760 hours. HbA1C: No results for input(s): HGBA1C in the last 72 hours. CBG: No results for input(s): GLUCAP in the last 168 hours. Lipid Profile: No results for input(s): CHOL, HDL, LDLCALC, TRIG, CHOLHDL, LDLDIRECT in the last 72 hours. Thyroid Function Tests: No results for input(s): TSH, T4TOTAL, FREET4, T3FREE, THYROIDAB in the last 72 hours. Anemia Panel: No results for input(s): VITAMINB12, FOLATE, FERRITIN, TIBC, IRON, RETICCTPCT in the last 72 hours. Sepsis Labs: Recent Labs  Lab 05/07/20 0740 05/08/20 0249 05/09/20 1035  PROCALCITON 0.96 0.70 0.36  LATICACIDVEN 1.2  --   --     Recent Results (from the past 240 hour(s))  Culture, blood (routine x 2)     Status: None (Preliminary result)   Collection Time: 05/07/20  7:40 AM   Specimen: BLOOD  Result Value Ref Range Status   Specimen Description BLOOD LEFT ANTECUBITAL   Final   Special Requests   Final    BOTTLES DRAWN AEROBIC AND ANAEROBIC Blood Culture results may not be optimal due to an excessive volume of blood received in culture bottles   Culture   Final    NO GROWTH 2 DAYS Performed at [facility], [address] [place], [place] [address]    Report Status PENDING  Incomplete  Culture, blood (routine x 2)     Status: None (Preliminary result)   Collection Time: 05/07/20  7:40 AM   Specimen: BLOOD  Result Value Ref Range Status   Specimen Description BLOOD RIGHT ANTECUBITAL  Final   Special Requests   Final    BOTTLES DRAWN AEROBIC AND ANAEROBIC Blood Culture adequate volume   Culture   Final    NO GROWTH 2 DAYS Performed at  [facility], [address] [place], [place] [address]    Report Status PENDING  Incomplete  Resp Panel by RT-PCR (Flu A&B, Covid) Nasopharyngeal Swab     Status: None   Collection Time: 05/07/20  8:26 AM   Specimen: Nasopharyngeal Swab; Nasopharyngeal(NP) swabs in vial transport medium  Result Value Ref Range Status   SARS Coronavirus 2 by RT PCR NEGATIVE NEGATIVE Final    Comment: (NOTE) SARS-CoV-2 target nucleic acids are NOT DETECTED.  The SARS-CoV-2 RNA is generally detectable in upper respiratory specimens during the acute phase of infection. The lowest concentration of SARS-CoV-2 viral copies this assay can detect is 138 copies/mL. A negative result does not preclude SARS-Cov-2 infection and should not be used as the sole basis for treatment or other patient management decisions. A negative result may occur with  improper specimen collection/handling, submission of specimen other than nasopharyngeal swab, presence of viral mutation(s) within the areas targeted by this assay, and inadequate number of viral copies(<138 copies/mL). A negative result must be combined with clinical observations, patient history, and epidemiological information. The expected result is Negative.  Fact Sheet  for Patients:  EntrepreneurPulse.com.au  Fact Sheet for Healthcare Providers:  IncredibleEmployment.be  This test is no t yet approved or cleared by the [place] FDA and  has been authorized for detection and/or diagnosis of SARS-CoV-2 by FDA under an Emergency Use Authorization (EUA). This EUA will remain  in effect (meaning this test can be used) for the duration of the COVID-19 declaration under Section 564(b)(1) of the Act, 21 U.S.C.section 360bbb-3(b)(1), unless the authorization is terminated  or revoked sooner.       Influenza A by PCR NEGATIVE NEGATIVE Final   Influenza B by PCR NEGATIVE NEGATIVE Final    Comment: (NOTE) The Xpert Xpress SARS-CoV-2/FLU/RSV plus assay is intended as an aid in the diagnosis of influenza from Nasopharyngeal swab specimens and should not be used as a sole basis for treatment. Nasal washings and aspirates are unacceptable for Xpert Xpress SARS-CoV-2/FLU/RSV testing.  Fact Sheet for Patients: EntrepreneurPulse.com.au  Fact Sheet for Healthcare Providers: IncredibleEmployment.be  This test is not yet approved or cleared by the [place] FDA and has been authorized for detection and/or diagnosis of SARS-CoV-2 by FDA under an Emergency Use Authorization (EUA). This EUA will remain in effect (meaning this test can be used) for the duration of the COVID-19 declaration under Section 564(b)(1) of the Act, 21 U.S.C. section 360bbb-3(b)(1), unless the authorization is terminated or revoked.  Performed at [facility], [address] [place], [place] [address]          Radiology Studies: NM GI Blood Loss  Result Date: 05/09/2020 CLINICAL DATA:  80 year old male with recurrent bright red blood per rectum on 05/08/2020. EXAM: NUCLEAR MEDICINE GASTROINTESTINAL BLEEDING SCAN TECHNIQUE: Sequential abdominal images were obtained following intravenous  administration of Tc-48m labeled red blood cells. RADIOPHARMACEUTICALS:  22.0 mCi Tc-34m pertechnetate in-vitro labeled red cells. COMPARISON:  CT Abdomen and Pelvis 07/02/2017. FINDINGS: 2 hour exam was performed. Normal blood pool activity demonstrated in the 1st hour, along with accumulation of radiotracer in the penis. Similar activity throughout the 2nd hour of the study, with addition of radiotracer in the urinary bladder. There is no radiotracer accumulation specific for active gastrointestinal bleeding. IMPRESSION: No active gastrointestinal bleeding identified on 2 hour tagged-red-cell scan. Electronically Signed   By: Genevie Ann M.D.   On: 05/09/2020 05:00        Scheduled Meds: . alfuzosin  10 mg Oral Daily  . ascorbic acid  500 mg Oral Daily  . aspirin EC  81 mg Oral Daily  . atorvastatin  40 mg Oral QPM  . calcium-vitamin D  1 tablet Oral BID  . [START ON 05/11/2020] enoxaparin (LOVENOX) injection  40 mg Subcutaneous Q24H  . lubiprostone  24 mcg Oral BID WC  . mouth rinse  15 mL Mouth Rinse BID  . pantoprazole (PROTONIX) IV  40 mg Intravenous Q12H  . polyethylene glycol-electrolytes  4,000 mL Oral Once  . Ensure Max Protein  1 each Oral BID  . sertraline  100 mg Oral Daily   Continuous Infusions: . sodium chloride 150 mL/hr at 05/09/20 0411  . ampicillin-sulbactam (UNASYN) IV 3 g (05/09/20 0835)  . magnesium sulfate bolus IVPB    . potassium chloride       LOS: 2 days    Time spent: 34 minutes    Sharen Hones, MD [facility]   To contact the attending provider between 7A-7P or the covering provider during after hours 7P-7A, please log into the web site [email or web address] and access using universal [place] password for that web site. If you do not have the password, please call the hospital operator.  05/09/2020, 12:16 PM

## 2020-05-09 NOTE — Progress Notes (Signed)
Med message from Dr. Allen Norris regarding holding lovenox today and tomorrow due to colonoscopy.

## 2020-05-09 NOTE — Progress Notes (Signed)
The patient had recurrent episodes of GI bleeding last night and was sent down for a red tagged cell bleeding scan that was negative.  The patient did have a drop in his hemoglobin. He was also transfused 2 units of blood. Due to his continued bleeding and inability tell me when his last colonoscopy was and no sign of any previous colonoscopies on his medical records I will try and contact the family to see if they would like to proceed with a colonoscopy.  It is questionable whether the patient will be able to tolerate the prep.

## 2020-05-10 ENCOUNTER — Encounter: Payer: Self-pay | Admitting: Internal Medicine

## 2020-05-10 ENCOUNTER — Inpatient Hospital Stay: Payer: Medicare Other | Admitting: Registered Nurse

## 2020-05-10 ENCOUNTER — Inpatient Hospital Stay: Payer: Medicare Other

## 2020-05-10 ENCOUNTER — Encounter
Admission: EM | Disposition: A | Discharge status: Home / Self Care | Payer: Self-pay | Source: Home / Self Care | Attending: Internal Medicine

## 2020-05-10 DIAGNOSIS — K625 Hemorrhage of anus and rectum: Secondary | ICD-10-CM

## 2020-05-10 HISTORY — PX: COLONOSCOPY WITH PROPOFOL: SHX5780

## 2020-05-10 LAB — TYPE AND SCREEN
ABO/RH(D): B POS
Antibody Screen: NEGATIVE
Unit division: 0
Unit division: 0
Unit division: 0

## 2020-05-10 LAB — HEMOGLOBIN AND HEMATOCRIT, BLOOD
HCT: 28.3 % — ABNORMAL LOW (ref 39.0–52.0)
Hemoglobin: 9.8 g/dL — ABNORMAL LOW (ref 13.0–17.0)

## 2020-05-10 LAB — CBC WITH DIFFERENTIAL/PLATELET
Abs Immature Granulocytes: 0.02 10*3/uL (ref 0.00–0.07)
Basophils Absolute: 0 10*3/uL (ref 0.0–0.1)
Basophils Relative: 1 %
Eosinophils Absolute: 0.1 10*3/uL (ref 0.0–0.5)
Eosinophils Relative: 2 %
HCT: 26.7 % — ABNORMAL LOW (ref 39.0–52.0)
Hemoglobin: 8.9 g/dL — ABNORMAL LOW (ref 13.0–17.0)
Immature Granulocytes: 0 %
Lymphocytes Relative: 32 %
Lymphs Abs: 1.5 10*3/uL (ref 0.7–4.0)
MCH: 30.2 pg (ref 26.0–34.0)
MCHC: 33.3 g/dL (ref 30.0–36.0)
MCV: 90.5 fL (ref 80.0–100.0)
Monocytes Absolute: 0.4 10*3/uL (ref 0.1–1.0)
Monocytes Relative: 8 %
Neutro Abs: 2.7 10*3/uL (ref 1.7–7.7)
Neutrophils Relative %: 57 %
Platelets: 120 10*3/uL — ABNORMAL LOW (ref 150–400)
RBC: 2.95 MIL/uL — ABNORMAL LOW (ref 4.22–5.81)
RDW: 16 % — ABNORMAL HIGH (ref 11.5–15.5)
WBC: 4.7 10*3/uL (ref 4.0–10.5)
nRBC: 0 % (ref 0.0–0.2)

## 2020-05-10 LAB — IRON AND TIBC
Iron: 118 ug/dL (ref 45–182)
Saturation Ratios: 56 % — ABNORMAL HIGH (ref 17.9–39.5)
TIBC: 210 ug/dL — ABNORMAL LOW (ref 250–450)
UIBC: 92 ug/dL

## 2020-05-10 LAB — BPAM RBC
Blood Product Expiration Date: 202201232359
Blood Product Expiration Date: 202202042359
Blood Product Expiration Date: 202202162359
ISSUE DATE / TIME: 202201150444
ISSUE DATE / TIME: 202201160256
ISSUE DATE / TIME: 202201160531
Unit Type and Rh: 7300
Unit Type and Rh: 7300
Unit Type and Rh: 7300

## 2020-05-10 LAB — PHOSPHORUS: Phosphorus: 2.6 mg/dL (ref 2.5–4.6)

## 2020-05-10 LAB — BASIC METABOLIC PANEL
Anion gap: 6 (ref 5–15)
BUN: 17 mg/dL (ref 8–23)
CO2: 26 mmol/L (ref 22–32)
Calcium: 7.8 mg/dL — ABNORMAL LOW (ref 8.9–10.3)
Chloride: 108 mmol/L (ref 98–111)
Creatinine, Ser: 0.43 mg/dL — ABNORMAL LOW (ref 0.61–1.24)
GFR, Estimated: >60 mL/min (ref >60)
Glucose, Bld: 74 mg/dL (ref 70–99)
Potassium: 3.3 mmol/L — ABNORMAL LOW (ref 3.5–5.1)
Sodium: 140 mmol/L (ref 135–145)

## 2020-05-10 LAB — MAGNESIUM: Magnesium: 1.7 mg/dL (ref 1.7–2.4)

## 2020-05-10 LAB — FOLATE: Folate: 5.8 ng/mL — ABNORMAL LOW

## 2020-05-10 LAB — FERRITIN: Ferritin: 147 ng/mL (ref 24–336)

## 2020-05-10 SURGERY — COLONOSCOPY WITH PROPOFOL
Anesthesia: General

## 2020-05-10 MED ORDER — POTASSIUM CHLORIDE 10 MEQ/100ML IV SOLN
10.0000 meq | INTRAVENOUS | Status: AC
Start: 2020-05-10 — End: 2020-05-10
  Administered 2020-05-10 (×3): 10 meq via INTRAVENOUS
  Filled 2020-05-10: qty 100

## 2020-05-10 MED ORDER — PROPOFOL 10 MG/ML IV BOLUS
INTRAVENOUS | Status: DC | PRN
  Administered 2020-05-10: 30 mg via INTRAVENOUS

## 2020-05-10 MED ORDER — PROPOFOL 500 MG/50ML IV EMUL
INTRAVENOUS | Status: AC
  Filled 2020-05-10: qty 100

## 2020-05-10 MED ORDER — LIDOCAINE HCL (CARDIAC) PF 100 MG/5ML IV SOSY
PREFILLED_SYRINGE | INTRAVENOUS | Status: DC | PRN
  Administered 2020-05-10: 60 mg via INTRAVENOUS

## 2020-05-10 MED ORDER — PROPOFOL 500 MG/50ML IV EMUL
INTRAVENOUS | Status: DC | PRN
  Administered 2020-05-10: 125 ug/kg/min via INTRAVENOUS

## 2020-05-10 MED ORDER — LIDOCAINE HCL (PF) 2 % IJ SOLN
INTRAMUSCULAR | Status: AC
  Filled 2020-05-10: qty 10

## 2020-05-10 MED ORDER — IOHEXOL 350 MG/ML SOLN
100.0000 mL | Freq: Once | INTRAVENOUS | Status: AC | PRN
  Administered 2020-05-10: 100 mL via INTRAVENOUS

## 2020-05-10 MED ORDER — MAGNESIUM CITRATE PO SOLN
1.0000 | Freq: Once | ORAL | Status: AC
  Administered 2020-05-10: 1 via ORAL
  Filled 2020-05-10 (×2): qty 296

## 2020-05-10 MED ORDER — SODIUM CHLORIDE 0.9 % IV SOLN: INTRAVENOUS | Status: DC

## 2020-05-10 MED ORDER — POLYETHYLENE GLYCOL 3350 17 GM/SCOOP PO POWD
1.0000 | Freq: Once | ORAL | Status: AC
  Administered 2020-05-10: 255 g via ORAL
  Filled 2020-05-10 (×2): qty 255

## 2020-05-10 NOTE — Anesthesia Postprocedure Evaluation (Signed)
Anesthesia Post Note  Patient: [name]  Procedure(s) Performed: COLONOSCOPY WITH PROPOFOL (N/A )  Patient location during evaluation: PACU Anesthesia Type: General Level of consciousness: awake and alert, awake and oriented Pain management: pain level controlled Vital Signs Assessment: post-procedure vital signs reviewed and stable Respiratory status: spontaneous breathing, nonlabored ventilation and respiratory function stable Cardiovascular status: blood pressure returned to baseline Postop Assessment: no apparent nausea or vomiting Anesthetic complications: no   No complications documented.   Last Vitals:  Vitals:   05/10/20 1359 05/10/20 1409  BP: 92/67 122/71  Pulse: (!) 53 (!) 53  Resp: 15 14  Temp:    SpO2: 99% 99%    Last Pain:  Vitals:   05/10/20 1409  TempSrc:   PainSc: 0-No pain                 Phill Mutter

## 2020-05-10 NOTE — Transfer of Care (Signed)
Immediate Anesthesia Transfer of Care Note  Patient: [name]  Procedure(s) Performed: COLONOSCOPY WITH PROPOFOL (N/A )  Patient Location: Endoscopy Unit  Anesthesia Type:General  Level of Consciousness: drowsy  Airway & Oxygen Therapy: Patient Spontanous Breathing  Post-op Assessment: Report given to RN and Post -op Vital signs reviewed and stable  Post vital signs: Reviewed and stable  Last Vitals:  Vitals Value Taken Time  BP 102/74 05/10/20 1349  Temp 36.2 C 05/10/20 1349  Pulse 59 05/10/20 1349  Resp 14 05/10/20 1349  SpO2 99 % 05/10/20 1349    Last Pain:  Vitals:   05/10/20 1349  TempSrc: Temporal  PainSc: Asleep         Complications: No complications documented.

## 2020-05-10 NOTE — OR Nursing (Signed)
Spoke with Legal guardian Anselm Jungling gave telephone consent for colonoscopy.

## 2020-05-10 NOTE — Progress Notes (Signed)
   05/08/20 0440  Assess: MEWS Score  Temp (!) 94.4 F (34.7 C)  BP (!) 76/53  Pulse Rate 88  Resp 16  Level of Consciousness Alert  SpO2 91 %  Assess: MEWS Score  MEWS Temp 2  MEWS Systolic 2  MEWS Pulse 0  MEWS RR 0  MEWS LOC 0  MEWS Score 4  MEWS Score Color Red  Assess: if the MEWS score is Yellow or Red  Were vital signs taken at a resting state? Yes  Focused Assessment Change from prior assessment (see assessment flowsheet)  Early Detection of Sepsis Score *See Row Information* High  MEWS guidelines implemented *See Row Information* Yes  Treat  MEWS Interventions Escalated (See documentation below)  Take Vital Signs  Increase Vital Sign Frequency  Red: Q 1hr X 4 then Q 4hr X 4, if remains red, continue Q 4hrs  Escalate  MEWS: Escalate Red: discuss with charge nurse/RN and provider, consider discussing with RRT  Notify: Charge Nurse/RN  Name of Charge Nurse/RN Notified Sonya, RN  Date Charge Nurse/RN Notified 05/08/20  Time Charge Nurse/RN Notified [id number]  Notify: Provider  Provider Name/Title Sharion Settler, NP  Date Provider Notified 05/08/20  Time Provider Notified [phone]  Notification Type Page  Notification Reason Change in status  Response See new orders  Inserted for [employer] LPN

## 2020-05-10 NOTE — Progress Notes (Addendum)
PROGRESS NOTE    [name]  [id number] DOB: March 24, 1941 DOA: 05/07/2020 PCP: Steele Sizer, MD   Chief complaint.  Shortness of breath and rectal bleeding. Brief Narrative:  Kevin A Andrewsis a 80 y.o.malewith medical history significant ofcerebral palsy,nonverbal at baseline, hyperlipidemia, wheelchair-bound, prostate cancer, BPH, lymphedema, PAD, depression, anxiety, who presents withSOB.He had a transient hypoxemia, later improved. Patient also had a significant leukocytosis, he was diagnosed with healthcare associated pneumonia, was treated with cefepime and vancomycin. Patient developed fresh red blood per rectum on 1/15, GI consult is obtained. Patient also received 3 units of PRBC.   Assessment & Plan:   Principal Problem:   HCAP (healthcare-associated pneumonia) Active Problems:   Cerebral palsy (HCC)   BPH (benign prostatic hyperplasia)   Hyperlipidemia   Depression with anxiety   Acute metabolic encephalopathy   Hematochezia   Acute blood loss anemia   Rectal bleed  #1.  Healthcare associated pneumonia. Acute metabolic encephalopathy. Mental status has been improved.  Patient is back to baseline.  Procalcitonin level also getting much better.  Patient probably had a 1 aspiration event.  I will continue 1 more day of Unasyn, changed to Augmentin tomorrow with patient p.o. intake become more reliable.  #2.  Acute blood loss anemia Fresh red blood per rectum. Patient received multiple units of blood transfusion.  Hemoglobin has been stable.  Bleeding scan did not show any local accumulation.  Patient is getting colonoscopy today. Bleeding seems to have stopped today.  #3.  Hypokalemia. Hypomagnesemia Continue to replete potassium.  #4.  Cerebral palsy.  quadriplegia.   DVT prophylaxis: SCDS Code Status: Full Family Communication:  Disposition Plan:  .   Status is: Inpatient  Remains inpatient appropriate because:Inpatient level of  care appropriate due to severity of illness   Dispo: The patient is from: Home              Anticipated d/c is to: Home              Anticipated d/c date is: 1 day              Patient currently is not medically stable to d/c.        I/O last 3 completed shifts: In: 3092.7 [P.O.:720; I.V.:921.4; Blood:1251.3; IV Piggyback:200] Out: 1 [Stool:1] No intake/output data recorded.     Consultants:   GI  Procedures: colonoscopy  Antimicrobials: Unasyn  Subjective: Patient mental status seems to be baseline.  He does not have any short of breath or cough.  No hypoxia. No longer has any rectal bleeding.  No nausea vomiting. No fever or chills.  Objective: Vitals:   05/09/20 1000 05/09/20 1725 05/10/20 0025 05/10/20 0807  BP: (!) 90/54 98/69 102/60 (!) 116/92  Pulse: 60 (!) 56 62 96  Resp: 18 18 18 17   Temp: 98.1 F (36.7 C) 98.7 F (37.1 C) 98 F (36.7 C) 98.6 F (37 C)  TempSrc: Oral Axillary    SpO2: 98% 99% 100% 100%  Weight:        Intake/Output Summary (Last 24 hours) at 05/10/2020 1302 Last data filed at 05/09/2020 1854 Gross per 24 hour  Intake 480 ml  Output --  Net 480 ml   Filed Weights   05/07/20 0737  Weight: 61.5 kg    Examination:  General exam: Appears calm and comfortable  Respiratory system: Clear to auscultation. Respiratory effort normal. Cardiovascular system: S1 & S2 heard, RRR. No JVD, murmurs, rubs, gallops or clicks. No pedal edema.  Gastrointestinal system: Abdomen is nondistended, soft and nontender. No organomegaly or masses felt. Normal bowel sounds heard. Central nervous system: Alert and oriented x1. No focal neurological deficits. Extremities: Symmetric 5 x 5 power. Skin: No rashes, lesions or ulcers Psychiatry: Mood & affect appropriate.     Data Reviewed: I have personally reviewed following labs and imaging studies  CBC: Recent Labs  Lab 05/07/20 0740 05/08/20 0249 05/08/20 1251 05/09/20 1035 05/09/20 1706  05/09/20 2233 05/10/20 0620 05/10/20 1151  WBC 24.1* 18.7*  --  6.7  --   --  4.7  --   NEUTROABS 21.1*  --   --  4.8  --   --  2.7  --   HGB 10.6* 9.2*   < > 9.5* 9.8* 9.6* 8.9* 9.8*  HCT 32.3* 28.2*   < > 26.8* 28.4* 28.7* 26.7* 28.3*  MCV 96.4 97.9  --  89.6  --   --  90.5  --   PLT 201 156  --  143*  --   --  120*  --    < > = values in this interval not displayed.   Basic Metabolic Panel: Recent Labs  Lab 05/07/20 0740 05/08/20 0249 05/09/20 1035 05/10/20 0620  NA 138 140 138 140  K 3.5 3.4* 2.9* 3.3*  CL 103 107 107 108  CO2 27 22 25 26   GLUCOSE 98 82 115* 74  BUN 25* 23 22 17   CREATININE 0.72 0.44* 0.48* 0.43*  CALCIUM 8.6* 8.0* 7.7* 7.8*  MG  --   --  1.6* 1.7  PHOS  --   --   --  2.6   GFR: Estimated Creatinine Clearance: 67.3 mL/min (A) (by C-G formula based on SCr of 0.43 mg/dL (L)). Liver Function Tests: Recent Labs  Lab 05/07/20 0740  AST 14*  ALT 9  ALKPHOS 93  BILITOT 1.1  PROT 6.3*  ALBUMIN 2.7*   No results for input(s): LIPASE, AMYLASE in the last 168 hours. No results for input(s): AMMONIA in the last 168 hours. Coagulation Profile: No results for input(s): INR, PROTIME in the last 168 hours. Cardiac Enzymes: No results for input(s): CKTOTAL, CKMB, CKMBINDEX, TROPONINI in the last 168 hours. BNP (last 3 results) No results for input(s): PROBNP in the last 8760 hours. HbA1C: No results for input(s): HGBA1C in the last 72 hours. CBG: No results for input(s): GLUCAP in the last 168 hours. Lipid Profile: No results for input(s): CHOL, HDL, LDLCALC, TRIG, CHOLHDL, LDLDIRECT in the last 72 hours. Thyroid Function Tests: No results for input(s): TSH, T4TOTAL, FREET4, T3FREE, THYROIDAB in the last 72 hours. Anemia Panel: No results for input(s): VITAMINB12, FOLATE, FERRITIN, TIBC, IRON, RETICCTPCT in the last 72 hours. Sepsis Labs: Recent Labs  Lab 05/07/20 0740 05/08/20 0249 05/09/20 1035  PROCALCITON 0.96 0.70 0.36  LATICACIDVEN 1.2  --    --     Recent Results (from the past 240 hour(s))  Culture, blood (routine x 2)     Status: None (Preliminary result)   Collection Time: 05/07/20  7:40 AM   Specimen: BLOOD  Result Value Ref Range Status   Specimen Description BLOOD LEFT ANTECUBITAL  Final   Special Requests   Final    BOTTLES DRAWN AEROBIC AND ANAEROBIC Blood Culture results may not be optimal due to an excessive volume of blood received in culture bottles   Culture   Final    NO GROWTH 3 DAYS Performed at [facility], [address] [place], [place]  [address]    Report Status PENDING  Incomplete  Culture, blood (routine x 2)     Status: None (Preliminary result)   Collection Time: 05/07/20  7:40 AM   Specimen: BLOOD  Result Value Ref Range Status   Specimen Description BLOOD RIGHT ANTECUBITAL  Final   Special Requests   Final    BOTTLES DRAWN AEROBIC AND ANAEROBIC Blood Culture adequate volume   Culture   Final    NO GROWTH 3 DAYS Performed at [facility], [address] [place], [place] [address]    Report Status PENDING  Incomplete  Resp Panel by RT-PCR (Flu A&B, Covid) Nasopharyngeal Swab     Status: None   Collection Time: 05/07/20  8:26 AM   Specimen: Nasopharyngeal Swab; Nasopharyngeal(NP) swabs in vial transport medium  Result Value Ref Range Status   SARS Coronavirus 2 by RT PCR NEGATIVE NEGATIVE Final    Comment: (NOTE) SARS-CoV-2 target nucleic acids are NOT DETECTED.  The SARS-CoV-2 RNA is generally detectable in upper respiratory specimens during the acute phase of infection. The lowest concentration of SARS-CoV-2 viral copies this assay can detect is 138 copies/mL. A negative result does not preclude SARS-Cov-2 infection and should not be used as the sole basis for treatment or other patient management decisions. A negative result may occur with  improper specimen collection/handling, submission of specimen other than nasopharyngeal swab, presence of viral  mutation(s) within the areas targeted by this assay, and inadequate number of viral copies(<138 copies/mL). A negative result must be combined with clinical observations, patient history, and epidemiological information. The expected result is Negative.  Fact Sheet for Patients:  EntrepreneurPulse.com.au  Fact Sheet for Healthcare Providers:  IncredibleEmployment.be  This test is no t yet approved or cleared by the [place] FDA and  has been authorized for detection and/or diagnosis of SARS-CoV-2 by FDA under an Emergency Use Authorization (EUA). This EUA will remain  in effect (meaning this test can be used) for the duration of the COVID-19 declaration under Section 564(b)(1) of the Act, 21 U.S.C.section 360bbb-3(b)(1), unless the authorization is terminated  or revoked sooner.       Influenza A by PCR NEGATIVE NEGATIVE Final   Influenza B by PCR NEGATIVE NEGATIVE Final    Comment: (NOTE) The Xpert Xpress SARS-CoV-2/FLU/RSV plus assay is intended as an aid in the diagnosis of influenza from Nasopharyngeal swab specimens and should not be used as a sole basis for treatment. Nasal washings and aspirates are unacceptable for Xpert Xpress SARS-CoV-2/FLU/RSV testing.  Fact Sheet for Patients: EntrepreneurPulse.com.au  Fact Sheet for Healthcare Providers: IncredibleEmployment.be  This test is not yet approved or cleared by the [place] FDA and has been authorized for detection and/or diagnosis of SARS-CoV-2 by FDA under an Emergency Use Authorization (EUA). This EUA will remain in effect (meaning this test can be used) for the duration of the COVID-19 declaration under Section 564(b)(1) of the Act, 21 U.S.C. section 360bbb-3(b)(1), unless the authorization is terminated or revoked.  Performed at [facility], [address], [place], [place] [address]          Radiology  Studies: NM GI Blood Loss  Result Date: 05/09/2020 CLINICAL DATA:  80 year old male with recurrent bright red blood per rectum on 05/08/2020. EXAM: NUCLEAR MEDICINE GASTROINTESTINAL BLEEDING SCAN TECHNIQUE: Sequential abdominal images were obtained following intravenous administration of Tc-5m labeled red blood cells. RADIOPHARMACEUTICALS:  22.0 mCi Tc-30m pertechnetate in-vitro labeled red cells. COMPARISON:  CT Abdomen and Pelvis 07/02/2017. FINDINGS: 2  hour exam was performed. Normal blood pool activity demonstrated in the 1st hour, along with accumulation of radiotracer in the penis. Similar activity throughout the 2nd hour of the study, with addition of radiotracer in the urinary bladder. There is no radiotracer accumulation specific for active gastrointestinal bleeding. IMPRESSION: No active gastrointestinal bleeding identified on 2 hour tagged-red-cell scan. Electronically Signed   By: Genevie Ann M.D.   On: 05/09/2020 05:00        Scheduled Meds: . [MAR Hold] alfuzosin  10 mg Oral Daily  . [MAR Hold] ascorbic acid  500 mg Oral Daily  . [MAR Hold] aspirin EC  81 mg Oral Daily  . [MAR Hold] atorvastatin  40 mg Oral QPM  . [MAR Hold] calcium-vitamin D  1 tablet Oral BID  . [MAR Hold] enoxaparin (LOVENOX) injection  40 mg Subcutaneous Q24H  . [MAR Hold] lubiprostone  24 mcg Oral BID WC  . [MAR Hold] mouth rinse  15 mL Mouth Rinse BID  . [MAR Hold] pantoprazole (PROTONIX) IV  40 mg Intravenous Q12H  . [MAR Hold] Ensure Max Protein  1 each Oral BID  . [MAR Hold] sertraline  100 mg Oral Daily   Continuous Infusions: . [MAR Hold] sodium chloride 150 mL/hr at 05/09/20 0411  . [MAR Hold] ampicillin-sulbactam (UNASYN) IV 3 g (05/10/20 0857)     LOS: 3 days    Time spent: 28 minutes    Sharen Hones, MD [facility]   To contact the attending provider between 7A-7P or the covering provider during after hours 7P-7A, please log into the web site [email or web address] and access using  universal [place] password for that web site. If you do not have the password, please call the hospital operator.  05/10/2020, 1:02 PM

## 2020-05-10 NOTE — Op Note (Signed)
[facility] Gastroenterology Patient Name: [name] Procedure Date: 05/10/2020 1:22 PM MRN: [id number] Account #: [id number] Date of Birth: 1940-05-22 Admit Type: Inpatient Age: 80 Room: [facility] ENDO ROOM 1 Gender: Male Note Status: Finalized Procedure:             Colonoscopy Indications:           Rectal bleeding Providers:             Lin Landsman MD, MD Referring MD:          Bethena Roys. Sowles, MD (Referring MD) Medicines:             General Anesthesia Complications:         No immediate complications. Estimated blood loss: None. Procedure:             Pre-Anesthesia Assessment:                        - Prior to the procedure, a History and Physical was                         performed, and patient medications and allergies were                         reviewed. The patient is unable to give consent                         secondary to the patient being legally incompetent to                         consent. The risks and benefits of the procedure and                         the sedation options and risks were discussed with the                         patient's guardian. All questions were answered and                         informed consent was obtained. Patient identification                         and proposed procedure were verified by the physician,                         the nurse, the anesthesiologist, the anesthetist and                         the technician in the pre-procedure area in the                         procedure room in the endoscopy suite. Mental Status                         Examination: alert but confused. Airway Examination:                         normal oropharyngeal airway and neck mobility.  Respiratory Examination: clear to auscultation. CV                         Examination: normal. Prophylactic Antibiotics: The                         patient does not require prophylactic antibiotics.                          Prior Anticoagulants: The patient has taken no                         previous anticoagulant or antiplatelet agents. ASA                         Grade Assessment: III - A patient with severe systemic                         disease. After reviewing the risks and benefits, the                         patient was deemed in satisfactory condition to                         undergo the procedure. The anesthesia plan was to use                         general anesthesia. Immediately prior to                         administration of medications, the patient was                         re-assessed for adequacy to receive sedatives. The                         heart rate, respiratory rate, oxygen saturations,                         blood pressure, adequacy of pulmonary ventilation, and                         response to care were monitored throughout the                         procedure. The physical status of the patient was                         re-assessed after the procedure.                        After obtaining informed consent, the colonoscope was                         passed under direct vision. Throughout the procedure,                         the patient's blood pressure, pulse, and oxygen  saturations were monitored continuously. The procedure                         was aborted. The colonscope was not inserted.                         Medications were given. The colonoscopy was unusually                         difficult due to unsatisfactory bowel prep. The                         patient tolerated the procedure well. The quality of                         the bowel preparation was inadequate. Findings:      The digital rectal exam findings include blood.      Red blood with clots and solid stool was found in the rectum and unable       to advance further safely, therefore the procedure is aborted. Impression:            -  Preparation of the colon was inadequate.                        - Blood found on digital rectal exam.                        - Blood in the rectum.                        - No specimens collected. Recommendation:        - Return patient to hospital ward for ongoing care.                        - Clear liquid diet today.                        - Recommend bleeding scan or CT Angio, if negative,                         recommend repeat bowel prep to attempt colonoscopy                         tomorrow                        - Monitor CBC closely Diagnosis Code(s):     --- Professional ---                        K62.5, Hemorrhage of anus and rectum Dr. Ulyess Mort Lin Landsman MD, MD 05/10/2020 1:47:26 PM This report has been signed electronically. Number of Addenda: 0 Note Initiated On: 05/10/2020 1:22 PM Total Procedure Duration: 0 hours 1 minute 56 seconds  Estimated Blood Loss:  Estimated blood loss: none.      [facility]

## 2020-05-10 NOTE — OR Nursing (Signed)
Patient is being transported to Nuclear Medicine via bed by Marzetta Merino RN for bleeding scan. Floor nurse [place] notified of same. New 20 gauge IV started in right forearm for bleeding scan done by Marzetta Merino RN.

## 2020-05-10 NOTE — Anesthesia Preprocedure Evaluation (Signed)
Anesthesia Evaluation  Patient identified by MRN, date of birth, ID band Patient confused    Reviewed: Allergy & Precautions, H&P , NPO status , Patient's Chart, lab work & pertinent test results, reviewed documented beta blocker date and time   History of Anesthesia Complications Negative for: history of anesthetic complications  Airway Mallampati: III  TM Distance: >3 FB Neck ROM: full    Dental  (+) Dental Advidsory Given   Pulmonary pneumonia, resolved,    Pulmonary exam normal        Cardiovascular Exercise Tolerance: Good hypertension, (-) angina+ CAD and + Peripheral Vascular Disease  (-) Past MI, (-) Cardiac Stents and (-) CABG Normal cardiovascular exam(-) dysrhythmias (-) Valvular Problems/Murmurs     Neuro/Psych neg Seizures PSYCHIATRIC DISORDERS Anxiety Depression Dementia Cerebral palsy and mental retardation  Neuromuscular disease    GI/Hepatic negative GI ROS, Neg liver ROS,   Endo/Other  negative endocrine ROS  Renal/GU Renal disease (kidney stones and cysts)  negative genitourinary   Musculoskeletal   Abdominal Normal abdominal exam  (+)   Peds  Hematology negative hematology ROS (+)   Anesthesia Other Findings Past Medical History: No date: Anxiety No date: Atrophy, disuse, muscle No date: BPH (benign prostatic hyperplasia) No date: Cerebral palsy (HCC) No date: Eczema No date: Elevated lipids No date: Elevated PSA No date: Exotropia No date: Hematuria, gross No date: Hyperlipidemia No date: Incontinence No date: Kidney stone No date: Lower extremity edema     Comment:  lymph drainage problems No date: Lower urinary tract symptoms No date: Lymphedema No date: Mental retardation No date: Over weight No date: Prostate cancer ([place]) No date: Urinary frequency No date: Urinary urgency No date: UTI (urinary tract infection) No date: Venous insufficiency No date: Vitamin D deficiency No  date: Wheelchair dependence   Reproductive/Obstetrics negative OB ROS                             Anesthesia Physical  Anesthesia Plan  ASA: III  Anesthesia Plan: General   Post-op Pain Management:    Induction: Intravenous  PONV Risk Score and Plan: 2  Airway Management Planned: Mask  Additional Equipment:   Intra-op Plan:   Post-operative Plan:   Informed Consent: I have reviewed the patients History and Physical, chart, labs and discussed the procedure including the risks, benefits and alternatives for the proposed anesthesia with the patient or authorized representative who has indicated his/her understanding and acceptance.     Dental Advisory Given  Plan Discussed with: Anesthesiologist, CRNA and Surgeon  Anesthesia Plan Comments: (Consent From POA)        Anesthesia Quick Evaluation

## 2020-05-11 ENCOUNTER — Encounter
Admission: EM | Disposition: A | Discharge status: Home / Self Care | Payer: Self-pay | Source: Home / Self Care | Attending: Internal Medicine

## 2020-05-11 ENCOUNTER — Inpatient Hospital Stay: Payer: Medicare Other | Admitting: Certified Registered Nurse Anesthetist

## 2020-05-11 ENCOUNTER — Telehealth: Payer: Self-pay | Admitting: Family Medicine

## 2020-05-11 DIAGNOSIS — D62 Acute posthemorrhagic anemia: Secondary | ICD-10-CM | POA: Diagnosis not present

## 2020-05-11 DIAGNOSIS — K625 Hemorrhage of anus and rectum: Secondary | ICD-10-CM | POA: Diagnosis not present

## 2020-05-11 LAB — MRSA PCR SCREENING: MRSA by PCR: NEGATIVE

## 2020-05-11 LAB — CBC WITH DIFFERENTIAL/PLATELET
Abs Immature Granulocytes: 0.01 10*3/uL (ref 0.00–0.07)
Basophils Absolute: 0 10*3/uL (ref 0.0–0.1)
Basophils Relative: 1 %
Eosinophils Absolute: 0.1 10*3/uL (ref 0.0–0.5)
Eosinophils Relative: 2 %
HCT: 26.7 % — ABNORMAL LOW (ref 39.0–52.0)
Hemoglobin: 9 g/dL — ABNORMAL LOW (ref 13.0–17.0)
Immature Granulocytes: 0 %
Lymphocytes Relative: 34 %
Lymphs Abs: 1.4 10*3/uL (ref 0.7–4.0)
MCH: 30.3 pg (ref 26.0–34.0)
MCHC: 33.7 g/dL (ref 30.0–36.0)
MCV: 89.9 fL (ref 80.0–100.0)
Monocytes Absolute: 0.4 10*3/uL (ref 0.1–1.0)
Monocytes Relative: 9 %
Neutro Abs: 2.3 10*3/uL (ref 1.7–7.7)
Neutrophils Relative %: 54 %
Platelets: 125 10*3/uL — ABNORMAL LOW (ref 150–400)
RBC: 2.97 MIL/uL — ABNORMAL LOW (ref 4.22–5.81)
RDW: 15.4 % (ref 11.5–15.5)
WBC: 4.2 10*3/uL (ref 4.0–10.5)
nRBC: 0 % (ref 0.0–0.2)

## 2020-05-11 LAB — URINALYSIS, COMPLETE (UACMP) WITH MICROSCOPIC
Bacteria, UA: NONE SEEN
Bilirubin Urine: NEGATIVE
Glucose, UA: NEGATIVE mg/dL
Ketones, ur: NEGATIVE mg/dL
Nitrite: NEGATIVE
Protein, ur: NEGATIVE mg/dL
Specific Gravity, Urine: 1.02 (ref 1.005–1.030)
pH: 6 (ref 5.0–8.0)

## 2020-05-11 LAB — BASIC METABOLIC PANEL
Anion gap: 7 (ref 5–15)
BUN: 11 mg/dL (ref 8–23)
CO2: 26 mmol/L (ref 22–32)
Calcium: 7.9 mg/dL — ABNORMAL LOW (ref 8.9–10.3)
Chloride: 109 mmol/L (ref 98–111)
Creatinine, Ser: 0.31 mg/dL — ABNORMAL LOW (ref 0.61–1.24)
GFR, Estimated: >60 mL/min (ref >60)
Glucose, Bld: 74 mg/dL (ref 70–99)
Potassium: 3.4 mmol/L — ABNORMAL LOW (ref 3.5–5.1)
Sodium: 142 mmol/L (ref 135–145)

## 2020-05-11 LAB — MAGNESIUM: Magnesium: 2 mg/dL (ref 1.7–2.4)

## 2020-05-11 LAB — VITAMIN B12: Vitamin B-12: 774 pg/mL (ref 180–914)

## 2020-05-11 LAB — STREP PNEUMONIAE URINARY ANTIGEN: Strep Pneumo Urinary Antigen: NEGATIVE

## 2020-05-11 SURGERY — COLONOSCOPY WITH PROPOFOL
Anesthesia: General

## 2020-05-11 MED ORDER — FOLIC ACID 1 MG PO TABS
1.0000 mg | ORAL_TABLET | Freq: Every day | ORAL | Status: DC
  Administered 2020-05-11 – 2020-05-17 (×6): 1 mg via ORAL
  Filled 2020-05-11 (×6): qty 1

## 2020-05-11 MED ORDER — POTASSIUM CHLORIDE 10 MEQ/100ML IV SOLN
10.0000 meq | INTRAVENOUS | Status: AC
  Administered 2020-05-11 (×2): 10 meq via INTRAVENOUS
  Filled 2020-05-11: qty 100

## 2020-05-11 MED ORDER — MAGNESIUM CITRATE PO SOLN
1.0000 | Freq: Once | ORAL | Status: AC
  Administered 2020-05-11: 1 via ORAL
  Filled 2020-05-11: qty 296

## 2020-05-11 MED ORDER — AMOXICILLIN-POT CLAVULANATE 875-125 MG PO TABS
1.0000 | ORAL_TABLET | Freq: Two times a day (BID) | ORAL | Status: DC
  Administered 2020-05-11: 1 via ORAL
  Filled 2020-05-11: qty 1

## 2020-05-11 MED ORDER — POLYETHYLENE GLYCOL 3350 17 G PO PACK
34.0000 g | PACK | Freq: Two times a day (BID) | ORAL | Status: DC
  Administered 2020-05-11 – 2020-05-17 (×11): 34 g via ORAL
  Filled 2020-05-11 (×11): qty 2

## 2020-05-11 MED ORDER — POTASSIUM CHLORIDE CRYS ER 20 MEQ PO TBCR
40.0000 meq | EXTENDED_RELEASE_TABLET | Freq: Once | ORAL | Status: AC
  Administered 2020-05-11: 40 meq via ORAL
  Filled 2020-05-11: qty 2

## 2020-05-11 NOTE — Progress Notes (Signed)
PROGRESS NOTE    [name]  [id number] DOB: [birth year] DOA: 05/07/2020 PCP: Steele Sizer, MD   Rectal bleeding. Brief Narrative:  Kevin A Andrewsis a 80 y.o.malewith medical history significant ofcerebral palsy,nonverbal at baseline, hyperlipidemia, wheelchair-bound, prostate cancer, BPH, lymphedema, PAD, depression, anxiety, who presents withSOB.He had a transient hypoxemia, later improved. Patient also had a significant leukocytosis, he was diagnosed with healthcare associated pneumonia, was treated with cefepime and vancomycin. Patient developed fresh red blood per rectum on 1/15, GI consult is obtained. Patient also received 3 units of PRBC.   Assessment & Plan:   Principal Problem:   HCAP (healthcare-associated pneumonia) Active Problems:   Cerebral palsy (HCC)   BPH (benign prostatic hyperplasia)   Hyperlipidemia   Depression with anxiety   Acute metabolic encephalopathy   Hematochezia   Acute blood loss anemia   Rectal bleed  #1.  Healthcare associated pneumonia. Most likely aspiration pneumonia. Acute metabolic encephalopathy. Condition had improved.  Will change antibiotic to oral Augmentin for additional 3 days.  #2.  Acute blood loss anemia secondary to rectal bleeding. Fresh red blood per rectum. Patient did not have additional bleeding since last night.  Colonoscopy could not be performed due to poor prep. Colonoscopy is scheduled for tomorrow again.  3.  Hypokalemia. Hypomagnesemia Condition improved.  Potassium will be given again.  #4.  Cerebral palsy.  Quadriplegia.    DVT prophylaxis: SCDs Code Status: Full Family Communication: guardian updated Disposition Plan:  .   Status is: Inpatient  Remains inpatient appropriate because:Inpatient level of care appropriate due to severity of illness   Dispo: The patient is from: Home              Anticipated d/c is to: Home              Anticipated d/c date is: 2 days               Patient currently is not medically stable to d/c.        I/O last 3 completed shifts: In: 550 [P.O.:400; I.V.:150] Out: 52 [Urine:1900] Total I/O In: -  Out: 800 [Urine:800]     Consultants:   GI  Procedures: Pending  Antimicrobials: Augmentin  Subjective: Patient is confused at the baseline.  He did not have additional bowel movement since last night.  Did not finish bowel prep.  No nausea vomiting. No short of breath or cough. No fever chills. No dysuria or hematuria.  Objective: Vitals:   05/10/20 1534 05/11/20 0044 05/11/20 0744 05/11/20 1149  BP: 119/83 129/75 115/63 124/77  Pulse: 64 (!) 53 (!) 52 (!) 56  Resp: 18 18 17 13   Temp: 98.3 F (36.8 C) 98.3 F (36.8 C) (!) 97.5 F (36.4 C) (!) 97.4 F (36.3 C)  TempSrc:   Oral Oral  SpO2: 100% 98% 100% 98%  Weight:        Intake/Output Summary (Last 24 hours) at 05/11/2020 1409 Last data filed at 05/11/2020 1115 Gross per 24 hour  Intake 400 ml  Output 2700 ml  Net -2300 ml   Filed Weights   05/07/20 0737  Weight: 61.5 kg    Examination:  General exam: Appears calm and comfortable  Respiratory system: Clear to auscultation. Respiratory effort normal. Cardiovascular system: S1 & S2 heard, RRR. No JVD, murmurs, rubs, gallops or clicks. No pedal edema. Gastrointestinal system: Abdomen is nondistended, soft and nontender. No organomegaly or masses felt. Normal bowel sounds heard. Central nervous system: Alert and oriented x2.  Leg weakness. Extremities: Symmetric  Skin: No rashes, lesions or ulcers Psychiatry: Mood & affect appropriate.     Data Reviewed: I have personally reviewed following labs and imaging studies  CBC: Recent Labs  Lab 05/07/20 0740 05/08/20 0249 05/08/20 1251 05/09/20 1035 05/09/20 1706 05/09/20 2233 05/10/20 0620 05/10/20 1151 05/11/20 0602  WBC 24.1* 18.7*  --  6.7  --   --  4.7  --  4.2  NEUTROABS 21.1*  --   --  4.8  --   --  2.7  --  2.3  HGB 10.6* 9.2*    < > 9.5* 9.8* 9.6* 8.9* 9.8* 9.0*  HCT 32.3* 28.2*   < > 26.8* 28.4* 28.7* 26.7* 28.3* 26.7*  MCV 96.4 97.9  --  89.6  --   --  90.5  --  89.9  PLT 201 156  --  143*  --   --  120*  --  125*   < > = values in this interval not displayed.   Basic Metabolic Panel: Recent Labs  Lab 05/07/20 0740 05/08/20 0249 05/09/20 1035 05/10/20 0620 05/11/20 0602  NA 138 140 138 140 142  K 3.5 3.4* 2.9* 3.3* 3.4*  CL 103 107 107 108 109  CO2 27 22 25 26 26   GLUCOSE 98 82 115* 74 74  BUN 25* 23 22 17 11   CREATININE 0.72 0.44* 0.48* 0.43* 0.31*  CALCIUM 8.6* 8.0* 7.7* 7.8* 7.9*  MG  --   --  1.6* 1.7 2.0  PHOS  --   --   --  2.6  --    GFR: Estimated Creatinine Clearance: 67.3 mL/min (A) (by C-G formula based on SCr of 0.31 mg/dL (L)). Liver Function Tests: Recent Labs  Lab 05/07/20 0740  AST 14*  ALT 9  ALKPHOS 93  BILITOT 1.1  PROT 6.3*  ALBUMIN 2.7*   No results for input(s): LIPASE, AMYLASE in the last 168 hours. No results for input(s): AMMONIA in the last 168 hours. Coagulation Profile: No results for input(s): INR, PROTIME in the last 168 hours. Cardiac Enzymes: No results for input(s): CKTOTAL, CKMB, CKMBINDEX, TROPONINI in the last 168 hours. BNP (last 3 results) No results for input(s): PROBNP in the last 8760 hours. HbA1C: No results for input(s): HGBA1C in the last 72 hours. CBG: No results for input(s): GLUCAP in the last 168 hours. Lipid Profile: No results for input(s): CHOL, HDL, LDLCALC, TRIG, CHOLHDL, LDLDIRECT in the last 72 hours. Thyroid Function Tests: No results for input(s): TSH, T4TOTAL, FREET4, T3FREE, THYROIDAB in the last 72 hours. Anemia Panel: Recent Labs    05/10/20 0620  VITAMINB12 774  FOLATE 5.8*  FERRITIN 147  TIBC 210*  IRON 118   Sepsis Labs: Recent Labs  Lab 05/07/20 0740 05/08/20 0249 05/09/20 1035  PROCALCITON 0.96 0.70 0.36  LATICACIDVEN 1.2  --   --     Recent Results (from the past 240 hour(s))  Culture, blood  (routine x 2)     Status: None (Preliminary result)   Collection Time: 05/07/20  7:40 AM   Specimen: BLOOD  Result Value Ref Range Status   Specimen Description   Final    BLOOD LEFT ANTECUBITAL Performed at [facility], [address] [place], [place] [address]    Special Requests   Final    BOTTLES DRAWN AEROBIC AND ANAEROBIC Blood Culture results may not be optimal due to an excessive volume of blood received in culture bottles Performed at [facility],  [place], [place] [address]    Culture   Final    NO GROWTH 4 DAYS Performed at [facility], [place] [address], [place], [place] [address]    Report Status PENDING  Incomplete  Culture, blood (routine x 2)     Status: None (Preliminary result)   Collection Time: 05/07/20  7:40 AM   Specimen: BLOOD  Result Value Ref Range Status   Specimen Description BLOOD RIGHT ANTECUBITAL  Final   Special Requests   Final    BOTTLES DRAWN AEROBIC AND ANAEROBIC Blood Culture adequate volume   Culture   Final    NO GROWTH 4 DAYS Performed at [facility], [address] [place], [place] [address]    Report Status PENDING  Incomplete  Resp Panel by RT-PCR (Flu A&B, Covid) Nasopharyngeal Swab     Status: None   Collection Time: 05/07/20  8:26 AM   Specimen: Nasopharyngeal Swab; Nasopharyngeal(NP) swabs in vial transport medium  Result Value Ref Range Status   SARS Coronavirus 2 by RT PCR NEGATIVE NEGATIVE Final    Comment: (NOTE) SARS-CoV-2 target nucleic acids are NOT DETECTED.  The SARS-CoV-2 RNA is generally detectable in upper respiratory specimens during the acute phase of infection. The lowest concentration of SARS-CoV-2 viral copies this assay can detect is 138 copies/mL. A negative result does not preclude SARS-Cov-2 infection and should not be used as the sole basis for treatment or other patient management decisions. A negative result may occur with  improper specimen  collection/handling, submission of specimen other than nasopharyngeal swab, presence of viral mutation(s) within the areas targeted by this assay, and inadequate number of viral copies(<138 copies/mL). A negative result must be combined with clinical observations, patient history, and epidemiological information. The expected result is Negative.  Fact Sheet for Patients:  EntrepreneurPulse.com.au  Fact Sheet for Healthcare Providers:  IncredibleEmployment.be  This test is no t yet approved or cleared by the [place] FDA and  has been authorized for detection and/or diagnosis of SARS-CoV-2 by FDA under an Emergency Use Authorization (EUA). This EUA will remain  in effect (meaning this test can be used) for the duration of the COVID-19 declaration under Section 564(b)(1) of the Act, 21 U.S.C.section 360bbb-3(b)(1), unless the authorization is terminated  or revoked sooner.       Influenza A by PCR NEGATIVE NEGATIVE Final   Influenza B by PCR NEGATIVE NEGATIVE Final    Comment: (NOTE) The Xpert Xpress SARS-CoV-2/FLU/RSV plus assay is intended as an aid in the diagnosis of influenza from Nasopharyngeal swab specimens and should not be used as a sole basis for treatment. Nasal washings and aspirates are unacceptable for Xpert Xpress SARS-CoV-2/FLU/RSV testing.  Fact Sheet for Patients: EntrepreneurPulse.com.au  Fact Sheet for Healthcare Providers: IncredibleEmployment.be  This test is not yet approved or cleared by the [place] FDA and has been authorized for detection and/or diagnosis of SARS-CoV-2 by FDA under an Emergency Use Authorization (EUA). This EUA will remain in effect (meaning this test can be used) for the duration of the COVID-19 declaration under Section 564(b)(1) of the Act, 21 U.S.C. section 360bbb-3(b)(1), unless the authorization is terminated or revoked.  Performed at [facility], [address] [place], [place] [address]   MRSA PCR Screening     Status: Abnormal   Collection Time: 05/11/20  2:26 AM   Specimen: Nasopharyngeal  Result Value Ref Range Status   MRSA by PCR (A) NEGATIVE Final  INVALID, UNABLE TO DETERMINE THE PRESENCE OF TARGET DUE TO SPECIMEN INTEGRITY. RECOLLECTION REQUESTED.    Comment:        The GeneXpert MRSA Assay (FDA approved for NASAL specimens only), is one component of a comprehensive MRSA colonization surveillance program. It is not intended to diagnose MRSA infection nor to guide or monitor treatment for MRSA infections. REQUEST FOR RECOLLECT CALLED TO JACKIE ATWATER AT [phone] ON 05/11/2020 [place]. Performed at [facility], [address] [place], [place] [address]          Radiology Studies: CT Angio Abd/Pel w/ and/or w/o  Result Date: 05/10/2020 CLINICAL DATA:  Lower GI bleeding.  Colonoscopy earlier today. EXAM: CTA ABDOMEN AND PELVIS WITHOUT AND WITH CONTRAST TECHNIQUE: Multidetector CT imaging of the abdomen and pelvis was performed using the standard protocol during bolus administration of intravenous contrast. Multiplanar reconstructed images and MIPs were obtained and reviewed to evaluate the vascular anatomy. CONTRAST:  171mL OMNIPAQUE IOHEXOL 350 MG/ML SOLN COMPARISON:  Nuclear medicine tagged red blood cell study-05/09/2020; CT abdomen pelvis-07/02/2017; abdominal MRI-08/06/2017 FINDINGS: VASCULAR Aorta: Scattered minimal amount of eccentric calcified and noncalcified atherosclerotic plaque within normal caliber abdominal aorta, not resulting in a hemodynamically significant narrowing. No evidence of abdominal aortic dissection or periaortic stranding. Celiac: Widely patent without a hemodynamically significant narrowing. An accessory left hepatic artery is noted to arise from the left gastric artery. Otherwise, conventional branching pattern. SMA: Widely patent without hemodynamically significant  narrowing. Conventional branching pattern. The distal tributaries of the SMA are widely patent without discrete lumen filling defect. Renals: The right renal artery is duplicated with accessory, nearly could dominant right renal artery arising superior to the takeoff of the IMA and supplying the inferior pole the right kidney. Both dominant renal arteries are widely patent without hemodynamically significant narrowing. No vessel irregularity to suggest FMD. IMA: Widely patent without hemodynamically significant narrowing. Inflow: There is a minimal amount of eccentric mixed calcified and noncalcified atherosclerotic plaque involving the bilateral normal caliber common iliac arteries, not resulting in hemodynamically significant stenosis. The bilateral internal iliac arteries are disease though patent and of normal caliber. The bilateral external iliac arteries are tortuous though of normal caliber and widely patent without hemodynamically significant narrowing. Proximal Outflow: The bilateral common and imaged portions of the bilateral deep and superficial femoral arteries are widely patent without hemodynamically significant narrowing. Veins: The IVC and pelvic venous systems appear widely patent. Review of the MIP images confirms the above findings. _________________________________________________________ NON-VASCULAR Lower chest: Limited visualization of the lower thorax demonstrates trace bilateral effusions with associated bibasilar opacities, likely atelectasis. Borderline cardiomegaly. No pericardial effusion. Hepatobiliary: Normal hepatic contour. No discrete hepatic lesions. Gallbladder appears mildly distended though without radiopaque gallstones or biliary sludge. No definitive evidence of gallbladder wall thickening. No definite intra or extrahepatic biliary ductal dilatation. No ascites. Pancreas: Normal appearance of the pancreas. Spleen: Normal appearance of the spleen. Adrenals/Urinary Tract: There  is symmetric enhancement and excretion of the bilateral kidneys. Multiple stones are seen within the right renal collecting system, pelvis and superior aspect of the right ureter with dominant stone within the superior aspect of the right ureter measuring approximately 0.7 cm in maximal diameter (image 44, series 2), not resulting in urinary obstruction. Note is made of a solitary punctate (approximately 3 mm) nonobstructing stone within the interpolar aspect the left kidney (image 29, series 2). Bilateral hypoattenuating nonenhancing renal cysts are seen bilaterally with dominant left-sided parapelvic cyst measuring 2.0 cm in diameter (image 28, series 6  and dominant partially exophytic cyst arising from the anterior inferior aspect the right kidney measuring 2.5 cm in diameter. No discrete worrisome renal lesions. No urinary obstruction or perinephric stranding. Normal appearance of the bilateral adrenal glands. There is diffuse thickening the urinary bladder wall with mild irregular thickening and potential trabeculation. Stomach/Bowel: Linear high density debris is seen within the cecum and ascending colon. Moderate colonic stool burden without evidence of enteric obstruction. There are no discrete areas of pooling intraluminal contrast extravasation to suggest the etiology of reported history of lower GI bleeding. There is diffuse wall thickening involving the cecum and proximal ascending colon as well as the distal sigmoid colon and rectum, not resulting in enteric obstruction. No significant hiatal hernia. No pneumoperitoneum, pneumatosis or portal venous gas. Lymphatic: No bulky retroperitoneal, mesenteric, pelvic or inguinal lymphadenopathy. Reproductive: Brachytherapy seeds are seen within the prostate bed. Small amount of fluid is seen with the pelvic cul-de-sac. Other: Small right-sided direct inguinal hernia with small amount of fluid seen within the hernia as well as suspected small varicocele. Diffuse  body wall anasarca. Musculoskeletal: No definite acute or aggressive osseous abnormalities. Redemonstrated bilateral L5 pars defects with associated grade 1 anterolisthesis of L5 upon S1 measuring approximately 8 mm, similar to the 2019 examination. There is partial lumbarization of the S1 vertebral body. Stigmata of dish within the lower thoracic and upper lumbar spine. Mild degenerative change the bilateral hips with joint space loss, subchondral sclerosis and osteophytosis. Deformity involving the posterior aspect of the right twelfth rib (image 24, series 2), likely the sequela of remote fracture. IMPRESSION: 1. No discrete areas of intraluminal contrast extravasation to suggest the etiology of reported history of lower GI bleeding. 2. Minimal amount of atherosclerotic plaque with a normal caliber abdominal aorta, not resulting in a hemodynamically significant stenosis. 3.  Aortic Atherosclerosis (ICD10-I70.0). NON-VASCULAR. 1. Diffuse wall thickening involving the cecum and proximal ascending colon as well as the distal sigmoid colon and rectum, nonspecific though suggestive of an enteritis. No evidence of enteric obstruction. 2. Extensive nonobstructing right-sided nephrolithiasis with solitary punctate (3 mm) nonobstructing left-sided renal stone. 3. Small right-sided direct inguinal hernia containing a small amount of fluid as well as suspected small varicocele. 4. Brachytherapy seeds within the prostate bed. 5. Diffuse thickening of the urinary bladder wall with mild irregular thickening and potential trabeculation - clinical correlation is advised. Further evaluation with nonemergent urinalysis and/or cystoscopy could be performed as indicated. 6. Bilateral L5 pars defects with associated grade 1 anterolisthesis of L5 upon S1, similar to the 2019 examination. Electronically Signed   By: Sandi Mariscal M.D.   On: 05/10/2020 15:12        Scheduled Meds: . alfuzosin  10 mg Oral Daily  . ascorbic acid   500 mg Oral Daily  . aspirin EC  81 mg Oral Daily  . atorvastatin  40 mg Oral QPM  . calcium-vitamin D  1 tablet Oral BID  . enoxaparin (LOVENOX) injection  40 mg Subcutaneous Q24H  . folic acid  1 mg Oral Daily  . lubiprostone  24 mcg Oral BID WC  . mouth rinse  15 mL Mouth Rinse BID  . pantoprazole (PROTONIX) IV  40 mg Intravenous Q12H  . polyethylene glycol  34 g Oral BID  . Ensure Max Protein  1 each Oral BID  . sertraline  100 mg Oral Daily   Continuous Infusions: . sodium chloride 150 mL/hr at 05/10/20 1331  . sodium chloride 20 mL/hr at 05/10/20 1622  .  ampicillin-sulbactam (UNASYN) IV 3 g (05/11/20 1335)     LOS: 4 days    Time spent: 27 minutes    Sharen Hones, MD [facility]   To contact the attending provider between 7A-7P or the covering provider during after hours 7P-7A, please log into the web site [email or web address] and access using universal [place] password for that web site. If you do not have the password, please call the hospital operator.  05/11/2020, 2:09 PM

## 2020-05-11 NOTE — Progress Notes (Signed)
Patient resisting to administer tap water enema unsuccessful.

## 2020-05-11 NOTE — Progress Notes (Signed)
Patient is starting to have liquid stools x2 since drinking the Mg citrate and miralax, no blood noted in stool.

## 2020-05-11 NOTE — TOC Initial Note (Signed)
Transition of Care [facility]) - Initial/Assessment Note    Patient Details  Name: [name] MRN: [id number] Date of Birth: 09/22/1940  Transition of [facility]) CM/SW Contact:    Shelbie Ammons, RN Phone Number: 05/11/2020, 3:27 PM  Clinical Narrative:    RNCM received phone call from patient's niece and legal guardian Anselm Jungling. Patient lives at [facility] group home and it will be her plan for him to return there when he is ready to discharge if he is able. Wells Guiles reports that she has been in communication with doctors as well as patient's nurse but that someone attempted to contact the group home today concerning his medical status. Assured her that there is no documentation of anyone from the hospital attempting this communication and that it is clearly documented that she is the decision maker. RNCM reached out to patient's bedside nurse and request she call niece for update.          Expected Discharge Plan: Group Home Barriers to Discharge: Continued Medical Work up   Patient Goals and CMS Choice        Expected Discharge Plan and Services Expected Discharge Plan: Group Home       Living arrangements for the past 2 months: Group Home                                      Prior Living Arrangements/Services Living arrangements for the past 2 months: Group Home Lives with:: Facility Resident Patient language and need for interpreter reviewed:: Yes        Need for Family Participation in Patient Care: Yes (Comment) Care giver support system in place?: Yes (comment)   Criminal Activity/Legal Involvement Pertinent to Current Situation/Hospitalization: No - Comment as needed  Activities of Daily Living      Permission Sought/Granted                  Emotional Assessment         Alcohol / Substance Use: Not Applicable Psych Involvement: No (comment)  Admission diagnosis:  HCAP (healthcare-associated pneumonia) [J18.9] Sepsis without  acute organ dysfunction, due to unspecified organism [facility]) [A41.9] Patient Active Problem List   Diagnosis Date Noted  . Acute blood loss anemia 05/09/2020  . Rectal bleed 05/09/2020  . Hematochezia   . HCAP (healthcare-associated pneumonia) 05/07/2020  . BPH (benign prostatic hyperplasia)   . Hyperlipidemia   . Depression with anxiety   . Acute metabolic encephalopathy   . Protein-calorie malnutrition, severe 04/22/2020  . Pressure injury of skin 04/22/2020  . Multifocal pneumonia   . Sepsis due to undetermined organism ([place]) 04/19/2020  . Personal history of urinary calculi 06/21/2019  . PAD (peripheral artery disease) ([place]) 07/04/2017  . Aortic valve calcification 07/04/2017  . Coronary artery calcification 07/04/2017  . Late effect acute polio 05/17/2016  . Chronic constipation 11/19/2015  . Mild major depression ([place]) 11/19/2015  . Bacteriuria, chronic 09/11/2015  . History of prostate cancer 03/02/2015  . Amyotrophia 03/02/2015  . Edema leg 03/02/2015  . Cerebral palsy ([place]) 03/02/2015  . Dyslipidemia 03/02/2015  . Dermatitis, eczematoid 03/02/2015  . 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 H/O acute poliomyelitis 03/02/2015  . Lymphedema 03/02/2015  . Intellectual disability 03/02/2015  . Hypertrophy of nail 03/02/2015  . Chronic venous insufficiency 03/02/2015  . Avitaminosis D 03/02/2015  . Adynamia 03/02/2015  . Dependent on wheelchair 03/02/2015  . Renal  cysts, acquired, bilateral 03/02/2015  . At risk for falling 03/02/2015  . Cerebral vascular disease 03/02/2015  . Strabismus 02/11/2015  . Urge incontinence of urine 02/11/2015  . Wheelchair dependence 02/11/2015  . Urinary frequency 10/30/2014   PCP:  Steele Sizer, MD Pharmacy:   [facility], [place] [place] [address] Phone: [phone] Fax: [phone]     Social Determinants of Health (SDOH) Interventions    Readmission Risk Interventions No flowsheet data  found.

## 2020-05-11 NOTE — Progress Notes (Signed)
Kevin Darby, MD [address]  [place]  [place], [place] [address]  Main: [phone]  Fax: [phone] Pager: [phone]   Subjective: Patient did not complete his prep.  He underwent CT angio which was unremarkable for active bleeding source.  When I went to the room, patient was asking to eat.  I offered him rest of magnesium citrate, some MiraLAX as well as popsicles and he finished all of them   Objective: Vital signs in last 24 hours: Vitals:   05/10/20 1534 05/11/20 0044 05/11/20 0744 05/11/20 1149  BP: 119/83 129/75 115/63 124/77  Pulse: 64 (!) 53 (!) 52 (!) 56  Resp: 18 18 17 13   Temp: 98.3 F (36.8 C) 98.3 F (36.8 C) (!) 97.5 F (36.4 C) (!) 97.4 F (36.3 C)  TempSrc:   Oral Oral  SpO2: 100% 98% 100% 98%  Weight:       Weight change:   Intake/Output Summary (Last 24 hours) at 05/11/2020 1337 Last data filed at 05/11/2020 1115 Gross per 24 hour  Intake 550 ml  Output 2700 ml  Net -2150 ml     Exam: Heart:: Regular rate and rhythm, S1S2 present or without murmur or extra heart sounds Lungs: normal and clear to auscultation Abdomen: soft, nontender, normal bowel sounds   Lab Results: CBC Latest Ref Rng & Units 05/11/2020 05/10/2020 05/10/2020  WBC 4.0 - 10.5 K/uL 4.2 - 4.7  Hemoglobin 13.0 - 17.0 g/dL 9.0(L) 9.8(L) 8.9(L)  Hematocrit 39.0 - 52.0 % 26.7(L) 28.3(L) 26.7(L)  Platelets 150 - 400 K/uL 125(L) - 120(L)   CMP Latest Ref Rng & Units 05/11/2020 05/10/2020 05/09/2020  Glucose 70 - 99 mg/dL 74 74 115(H)  BUN 8 - 23 mg/dL 11 17 22   Creatinine 0.61 - 1.24 mg/dL 0.31(L) 0.43(L) 0.48(L)  Sodium 135 - 145 mmol/L 142 140 138  Potassium 3.5 - 5.1 mmol/L 3.4(L) 3.3(L) 2.9(L)  Chloride 98 - 111 mmol/L 109 108 107  CO2 22 - 32 mmol/L 26 26 25   Calcium 8.9 - 10.3 mg/dL 7.9(L) 7.8(L) 7.7(L)  Total Protein 6.5 - 8.1 g/dL - - -  Total Bilirubin 0.3 - 1.2 mg/dL - - -  Alkaline Phos 38 - 126 U/L - - -  AST 15 - 41 U/L - - -  ALT 0 - 44 U/L - -  -    Micro Results: Recent Results (from the past 240 hour(s))  Culture, blood (routine x 2)     Status: None (Preliminary result)   Collection Time: 05/07/20  7:40 AM   Specimen: BLOOD  Result Value Ref Range Status   Specimen Description   Final    BLOOD LEFT ANTECUBITAL Performed at [facility], [address] [place], [place] [address]    Special Requests   Final    BOTTLES DRAWN AEROBIC AND ANAEROBIC Blood Culture results may not be optimal due to an excessive volume of blood received in culture bottles Performed at [facility], [address] [place], [place] [address]    Culture   Final    NO GROWTH 4 DAYS Performed at [facility], [place] [address], [place], [place] [address]    Report Status PENDING  Incomplete  Culture, blood (routine x 2)     Status: None (Preliminary result)   Collection Time: 05/07/20  7:40 AM   Specimen: BLOOD  Result Value Ref Range Status   Specimen Description BLOOD RIGHT ANTECUBITAL  Final   Special Requests  Final    BOTTLES DRAWN AEROBIC AND ANAEROBIC Blood Culture adequate volume   Culture   Final    NO GROWTH 4 DAYS Performed at [facility], [address] [place], [place] [address]    Report Status PENDING  Incomplete  Resp Panel by RT-PCR (Flu A&B, Covid) Nasopharyngeal Swab     Status: None   Collection Time: 05/07/20  8:26 AM   Specimen: Nasopharyngeal Swab; Nasopharyngeal(NP) swabs in vial transport medium  Result Value Ref Range Status   SARS Coronavirus 2 by RT PCR NEGATIVE NEGATIVE Final    Comment: (NOTE) SARS-CoV-2 target nucleic acids are NOT DETECTED.  The SARS-CoV-2 RNA is generally detectable in upper respiratory specimens during the acute phase of infection. The lowest concentration of SARS-CoV-2 viral copies this assay can detect is 138 copies/mL. A negative result does not preclude SARS-Cov-2 infection and should not be used as the sole basis for treatment or other patient  management decisions. A negative result may occur with  improper specimen collection/handling, submission of specimen other than nasopharyngeal swab, presence of viral mutation(s) within the areas targeted by this assay, and inadequate number of viral copies(<138 copies/mL). A negative result must be combined with clinical observations, patient history, and epidemiological information. The expected result is Negative.  Fact Sheet for Patients:  EntrepreneurPulse.com.au  Fact Sheet for Healthcare Providers:  IncredibleEmployment.be  This test is no t yet approved or cleared by the [place] FDA and  has been authorized for detection and/or diagnosis of SARS-CoV-2 by FDA under an Emergency Use Authorization (EUA). This EUA will remain  in effect (meaning this test can be used) for the duration of the COVID-19 declaration under Section 564(b)(1) of the Act, 21 U.S.C.section 360bbb-3(b)(1), unless the authorization is terminated  or revoked sooner.       Influenza A by PCR NEGATIVE NEGATIVE Final   Influenza B by PCR NEGATIVE NEGATIVE Final    Comment: (NOTE) The Xpert Xpress SARS-CoV-2/FLU/RSV plus assay is intended as an aid in the diagnosis of influenza from Nasopharyngeal swab specimens and should not be used as a sole basis for treatment. Nasal washings and aspirates are unacceptable for Xpert Xpress SARS-CoV-2/FLU/RSV testing.  Fact Sheet for Patients: EntrepreneurPulse.com.au  Fact Sheet for Healthcare Providers: IncredibleEmployment.be  This test is not yet approved or cleared by the [place] FDA and has been authorized for detection and/or diagnosis of SARS-CoV-2 by FDA under an Emergency Use Authorization (EUA). This EUA will remain in effect (meaning this test can be used) for the duration of the COVID-19 declaration under Section 564(b)(1) of the Act, 21 U.S.C. section 360bbb-3(b)(1),  unless the authorization is terminated or revoked.  Performed at [facility], [address] [place], [place] [address]   MRSA PCR Screening     Status: Abnormal   Collection Time: 05/11/20  2:26 AM   Specimen: Nasopharyngeal  Result Value Ref Range Status   MRSA by PCR (A) NEGATIVE Final    INVALID, UNABLE TO DETERMINE THE PRESENCE OF TARGET DUE TO SPECIMEN INTEGRITY. RECOLLECTION REQUESTED.    Comment:        The GeneXpert MRSA Assay (FDA approved for NASAL specimens only), is one component of a comprehensive MRSA colonization surveillance program. It is not intended to diagnose MRSA infection nor to guide or monitor treatment for MRSA infections. REQUEST FOR RECOLLECT CALLED TO JACKIE ATWATER AT [id number] ON 05/11/2020 [place]. Performed at [facility], [address] [place], [place] [address]  Studies/Results: CT Angio Abd/Pel w/ and/or w/o  Result Date: 05/10/2020 CLINICAL DATA:  Lower GI bleeding.  Colonoscopy earlier today. EXAM: CTA ABDOMEN AND PELVIS WITHOUT AND WITH CONTRAST TECHNIQUE: Multidetector CT imaging of the abdomen and pelvis was performed using the standard protocol during bolus administration of intravenous contrast. Multiplanar reconstructed images and MIPs were obtained and reviewed to evaluate the vascular anatomy. CONTRAST:  145mL OMNIPAQUE IOHEXOL 350 MG/ML SOLN COMPARISON:  Nuclear medicine tagged red blood cell study-05/09/2020; CT abdomen pelvis-07/02/2017; abdominal MRI-08/06/2017 FINDINGS: VASCULAR Aorta: Scattered minimal amount of eccentric calcified and noncalcified atherosclerotic plaque within normal caliber abdominal aorta, not resulting in a hemodynamically significant narrowing. No evidence of abdominal aortic dissection or periaortic stranding. Celiac: Widely patent without a hemodynamically significant narrowing. An accessory left hepatic artery is noted to arise from the left gastric artery. Otherwise, conventional branching  pattern. SMA: Widely patent without hemodynamically significant narrowing. Conventional branching pattern. The distal tributaries of the SMA are widely patent without discrete lumen filling defect. Renals: The right renal artery is duplicated with accessory, nearly could dominant right renal artery arising superior to the takeoff of the IMA and supplying the inferior pole the right kidney. Both dominant renal arteries are widely patent without hemodynamically significant narrowing. No vessel irregularity to suggest FMD. IMA: Widely patent without hemodynamically significant narrowing. Inflow: There is a minimal amount of eccentric mixed calcified and noncalcified atherosclerotic plaque involving the bilateral normal caliber common iliac arteries, not resulting in hemodynamically significant stenosis. The bilateral internal iliac arteries are disease though patent and of normal caliber. The bilateral external iliac arteries are tortuous though of normal caliber and widely patent without hemodynamically significant narrowing. Proximal Outflow: The bilateral common and imaged portions of the bilateral deep and superficial femoral arteries are widely patent without hemodynamically significant narrowing. Veins: The IVC and pelvic venous systems appear widely patent. Review of the MIP images confirms the above findings. _________________________________________________________ NON-VASCULAR Lower chest: Limited visualization of the lower thorax demonstrates trace bilateral effusions with associated bibasilar opacities, likely atelectasis. Borderline cardiomegaly. No pericardial effusion. Hepatobiliary: Normal hepatic contour. No discrete hepatic lesions. Gallbladder appears mildly distended though without radiopaque gallstones or biliary sludge. No definitive evidence of gallbladder wall thickening. No definite intra or extrahepatic biliary ductal dilatation. No ascites. Pancreas: Normal appearance of the pancreas. Spleen:  Normal appearance of the spleen. Adrenals/Urinary Tract: There is symmetric enhancement and excretion of the bilateral kidneys. Multiple stones are seen within the right renal collecting system, pelvis and superior aspect of the right ureter with dominant stone within the superior aspect of the right ureter measuring approximately 0.7 cm in maximal diameter (image 44, series 2), not resulting in urinary obstruction. Note is made of a solitary punctate (approximately 3 mm) nonobstructing stone within the interpolar aspect the left kidney (image 29, series 2). Bilateral hypoattenuating nonenhancing renal cysts are seen bilaterally with dominant left-sided parapelvic cyst measuring 2.0 cm in diameter (image 28, series 6 and dominant partially exophytic cyst arising from the anterior inferior aspect the right kidney measuring 2.5 cm in diameter. No discrete worrisome renal lesions. No urinary obstruction or perinephric stranding. Normal appearance of the bilateral adrenal glands. There is diffuse thickening the urinary bladder wall with mild irregular thickening and potential trabeculation. Stomach/Bowel: Linear high density debris is seen within the cecum and ascending colon. Moderate colonic stool burden without evidence of enteric obstruction. There are no discrete areas of pooling intraluminal contrast extravasation to suggest the etiology of reported history of lower GI bleeding. There  is diffuse wall thickening involving the cecum and proximal ascending colon as well as the distal sigmoid colon and rectum, not resulting in enteric obstruction. No significant hiatal hernia. No pneumoperitoneum, pneumatosis or portal venous gas. Lymphatic: No bulky retroperitoneal, mesenteric, pelvic or inguinal lymphadenopathy. Reproductive: Brachytherapy seeds are seen within the prostate bed. Small amount of fluid is seen with the pelvic cul-de-sac. Other: Small right-sided direct inguinal hernia with small amount of fluid seen  within the hernia as well as suspected small varicocele. Diffuse body wall anasarca. Musculoskeletal: No definite acute or aggressive osseous abnormalities. Redemonstrated bilateral L5 pars defects with associated grade 1 anterolisthesis of L5 upon S1 measuring approximately 8 mm, similar to the 2019 examination. There is partial lumbarization of the S1 vertebral body. Stigmata of dish within the lower thoracic and upper lumbar spine. Mild degenerative change the bilateral hips with joint space loss, subchondral sclerosis and osteophytosis. Deformity involving the posterior aspect of the right twelfth rib (image 24, series 2), likely the sequela of remote fracture. IMPRESSION: 1. No discrete areas of intraluminal contrast extravasation to suggest the etiology of reported history of lower GI bleeding. 2. Minimal amount of atherosclerotic plaque with a normal caliber abdominal aorta, not resulting in a hemodynamically significant stenosis. 3.  Aortic Atherosclerosis (ICD10-I70.0). NON-VASCULAR. 1. Diffuse wall thickening involving the cecum and proximal ascending colon as well as the distal sigmoid colon and rectum, nonspecific though suggestive of an enteritis. No evidence of enteric obstruction. 2. Extensive nonobstructing right-sided nephrolithiasis with solitary punctate (3 mm) nonobstructing left-sided renal stone. 3. Small right-sided direct inguinal hernia containing a small amount of fluid as well as suspected small varicocele. 4. Brachytherapy seeds within the prostate bed. 5. Diffuse thickening of the urinary bladder wall with mild irregular thickening and potential trabeculation - clinical correlation is advised. Further evaluation with nonemergent urinalysis and/or cystoscopy could be performed as indicated. 6. Bilateral L5 pars defects with associated grade 1 anterolisthesis of L5 upon S1, similar to the 2019 examination. Electronically Signed   By: Sandi Mariscal M.D.   On: 05/10/2020 15:12   Medications:   I have reviewed the patient's current medications. Prior to Admission:  Medications Prior to Admission  Medication Sig Dispense Refill Last Dose  . alfuzosin (UROXATRAL) 10 MG 24 hr tablet Take 1 tablet (10 mg total) by mouth daily. 90 tablet 1 05/06/2020 at 0800  . ARTIFICIAL TEARS 0.2-0.2-1 % SOLN PLACE 2 DROPS INTO EYE 2 TIMES A DAY AS NEEDED 15 mL 1 05/06/2020 at 2000  . ascorbic acid (VITAMIN C) 500 MG tablet TAKE ONE TABLET BY MOUTH ONCE DAILY (Patient taking differently: Take 500 mg by mouth daily.) 90 tablet 1 05/06/2020 at 0800  . aspirin (ASPIR-LOW) 81 MG EC tablet TAKE 1 TABLET BY MOUTH ONCE DAILY (Patient taking differently: Take 81 mg by mouth daily. TAKE 1 TABLET BY MOUTH ONCE DAILY) 90 tablet 1 05/06/2020 at 0800  . atorvastatin (LIPITOR) 40 MG tablet Take 1 tablet (40 mg total) by mouth every evening. 90 tablet 1 05/06/2020 at 2000  . Calcium Carb-Cholecalciferol (CALCIUM-VITAMIN D) 500-400 MG-UNIT TABS TAKE 1 TABLET BY MOUTH 2 TIMES A DAY 180 tablet 0 05/06/2020 at 2000  . Cranberry Juice Powder 425 MG CAPS TAKE 1 CAPSULE BY MOUTH ONCE DAILY (Patient taking differently: Take 425 mg by mouth daily.) 90 capsule 1 05/06/2020 at 0800  . lubiprostone (AMITIZA) 24 MCG capsule TAKE 1 CAPSULE BY MOUTH 2 TIMES DAILY WITH A MEAL (Patient taking differently: Take 24 mcg by  mouth 2 (two) times daily with a meal. TAKE 1 CAPSULE BY MOUTH 2 TIMES DAILY WITH A MEAL) 180 capsule 1 05/06/2020 at 1700  . Polyethyl Glycol-Propyl Glycol (SYSTANE ULTRA) 0.4-0.3 % SOLN Apply 1 drop to eye 2 (two) times daily. Both eyes 30 mL 5 05/06/2020 at 2000  . sertraline (ZOLOFT) 100 MG tablet Take 1 tablet (100 mg total) by mouth daily. 90 tablet 1 05/06/2020 at 0800  . acetaminophen (TYLENOL) 325 MG tablet TAKE 2 TABLETS (650 MG TOTAL) BY MOUTH EVERY 6 HOURS AS NEEDED FOR MILD PAIN OR HEADACHE. 60 tablet 5 unknown at prn  . ALPRAZolam (XANAX) 0.25 MG tablet TAKE ONE TABLET BY MOUTH 2 TIMES A DAY AS NEEDED FOR ANXIETY (WHEN  BLOOD PRESSURE IS VERY HIGH OR ANXIOUS) (Patient taking differently: Take 0.25 mg by mouth 2 (two) times daily as needed for anxiety or sleep. TAKE ONE TABLET BY MOUTH 2 TIMES A DAY AS NEEDED FOR ANXIETY (WHEN BLOOD PRESSURE IS VERY HIGH OR ANXIOUS)) 20 tablet 0 unknown at prn  . augmented betamethasone dipropionate (DIPROLENE-AF) 0.05 % cream APPLY TOPICALLY AS DIRECTED TO RASH AS NEEDED 45 g 0 unknown at prn  . Elastic Bandages & Supports (MEDICAL COMPRESSION STOCKINGS) MISC 1 Units by Does not apply route daily. 2 each 0   . Incontinence Supply Disposable (DEPEND SILHOUETTE BRIEFS L/XL) MISC 1 each by Does not apply route 2 (two) times daily as needed. 60 each 5   . Nutritional Supplements (ENSURE NUTRITION SHAKE) LIQD Take 1 each by mouth 2 (two) times daily. 237 mL 30    Scheduled: . alfuzosin  10 mg Oral Daily  . ascorbic acid  500 mg Oral Daily  . aspirin EC  81 mg Oral Daily  . atorvastatin  40 mg Oral QPM  . calcium-vitamin D  1 tablet Oral BID  . enoxaparin (LOVENOX) injection  40 mg Subcutaneous Q24H  . folic acid  1 mg Oral Daily  . lubiprostone  24 mcg Oral BID WC  . mouth rinse  15 mL Mouth Rinse BID  . pantoprazole (PROTONIX) IV  40 mg Intravenous Q12H  . polyethylene glycol  34 g Oral BID  . potassium chloride  40 mEq Oral Once  . Ensure Max Protein  1 each Oral BID  . sertraline  100 mg Oral Daily   Continuous: . sodium chloride 150 mL/hr at 05/10/20 1331  . sodium chloride 20 mL/hr at 05/10/20 1622  . ampicillin-sulbactam (UNASYN) IV 3 g (05/11/20 1335)   [id number] chloride, acetaminophen, acetaminophen, ALPRAZolam, ondansetron (ZOFRAN) IV, polyvinyl alcohol Anti-infectives (From admission, onward)   Start     Dose/Rate Route Frequency Ordered Stop   05/08/20 1315  Ampicillin-Sulbactam (UNASYN) 3 g in sodium chloride 0.9 % 100 mL IVPB        3 g 200 mL/hr over 30 Minutes Intravenous Every 6 hours 05/08/20 1222 05/12/20 2359   05/08/20 0100  vancomycin (VANCOREADY)  IVPB 750 mg/150 mL  Status:  Discontinued        750 mg 150 mL/hr over 60 Minutes Intravenous Every 12 hours 05/07/20 1053 05/08/20 1222   05/07/20 1800  ceFEPIme (MAXIPIME) 2 g in sodium chloride 0.9 % 100 mL IVPB  Status:  Discontinued        2 g 200 mL/hr over 30 Minutes Intravenous Every 8 hours 05/07/20 1052 05/08/20 1222   05/07/20 1100  vancomycin (VANCOREADY) IVPB 1500 mg/300 mL        1,500 mg 150 mL/hr  over 120 Minutes Intravenous  Once 05/07/20 1047 05/07/20 1503   05/07/20 0945  vancomycin (VANCOCIN) IVPB 1000 mg/200 mL premix  Status:  Discontinued        1,000 mg 200 mL/hr over 60 Minutes Intravenous  Once 05/07/20 0931 05/07/20 1046   05/07/20 0930  ceFEPIme (MAXIPIME) 2 g in sodium chloride 0.9 % 100 mL IVPB        2 g 200 mL/hr over 30 Minutes Intravenous  Once 05/07/20 0924 05/07/20 1101   05/07/20 0930  azithromycin (ZITHROMAX) 500 mg in sodium chloride 0.9 % 250 mL IVPB        500 mg 250 mL/hr over 60 Minutes Intravenous  Once 05/07/20 0924 05/07/20 1133     Scheduled Meds: . alfuzosin  10 mg Oral Daily  . ascorbic acid  500 mg Oral Daily  . aspirin EC  81 mg Oral Daily  . atorvastatin  40 mg Oral QPM  . calcium-vitamin D  1 tablet Oral BID  . enoxaparin (LOVENOX) injection  40 mg Subcutaneous Q24H  . folic acid  1 mg Oral Daily  . lubiprostone  24 mcg Oral BID WC  . mouth rinse  15 mL Mouth Rinse BID  . pantoprazole (PROTONIX) IV  40 mg Intravenous Q12H  . polyethylene glycol  34 g Oral BID  . potassium chloride  40 mEq Oral Once  . Ensure Max Protein  1 each Oral BID  . sertraline  100 mg Oral Daily   Continuous Infusions: . sodium chloride 150 mL/hr at 05/10/20 1331  . sodium chloride 20 mL/hr at 05/10/20 1622  . ampicillin-sulbactam (UNASYN) IV 3 g (05/11/20 1335)   PRN Meds:.sodium chloride, acetaminophen, acetaminophen, ALPRAZolam, ondansetron (ZOFRAN) IV, polyvinyl alcohol   Assessment: Principal Problem:   HCAP (healthcare-associated  pneumonia) Active Problems:   Cerebral palsy (HCC)   BPH (benign prostatic hyperplasia)   Hyperlipidemia   Depression with anxiety   Acute metabolic encephalopathy   Hematochezia   Acute blood loss anemia   Rectal bleed  Plan: Hematochezia: Patient is hemodynamically stable and hemoglobin is relatively stable with mild drop from 9.8 to 9 since yesterday, but overall relatively stable since admission CT angiogram negative for active bleeding source Patient underwent colonoscopy yesterday and it was aborted due to blood with solid stool in the rectum.  He did not finish prep yesterday Recommend to encourage bowel prep today, continue clear liquids Patient will need assistance with his prep and oral intake Monitor CBC closely If he has further episodes of rectal bleeding with drop in hemoglobin, recommend bleeding scan Tentative plan for upper endoscopy as well as colonoscopy tomorrow   LOS: 4 days   Lilia Letterman 05/11/2020, 1:37 PM

## 2020-05-12 ENCOUNTER — Inpatient Hospital Stay: Payer: Medicare Other | Admitting: Anesthesiology

## 2020-05-12 ENCOUNTER — Encounter: Payer: Self-pay | Admitting: Internal Medicine

## 2020-05-12 ENCOUNTER — Encounter
Admission: EM | Disposition: A | Discharge status: Home / Self Care | Payer: Self-pay | Source: Home / Self Care | Attending: Internal Medicine

## 2020-05-12 DIAGNOSIS — K625 Hemorrhage of anus and rectum: Secondary | ICD-10-CM | POA: Diagnosis not present

## 2020-05-12 HISTORY — PX: COLONOSCOPY WITH PROPOFOL: SHX5780

## 2020-05-12 HISTORY — PX: ESOPHAGOGASTRODUODENOSCOPY (EGD) WITH PROPOFOL: SHX5813

## 2020-05-12 LAB — CBC WITH DIFFERENTIAL/PLATELET
Abs Immature Granulocytes: 0.03 10*3/uL (ref 0.00–0.07)
Basophils Absolute: 0 10*3/uL (ref 0.0–0.1)
Basophils Relative: 1 %
Eosinophils Absolute: 0.2 10*3/uL (ref 0.0–0.5)
Eosinophils Relative: 4 %
HCT: 28.2 % — ABNORMAL LOW (ref 39.0–52.0)
Hemoglobin: 9.3 g/dL — ABNORMAL LOW (ref 13.0–17.0)
Immature Granulocytes: 1 %
Lymphocytes Relative: 34 %
Lymphs Abs: 1.4 10*3/uL (ref 0.7–4.0)
MCH: 30.4 pg (ref 26.0–34.0)
MCHC: 33 g/dL (ref 30.0–36.0)
MCV: 92.2 fL (ref 80.0–100.0)
Monocytes Absolute: 0.4 10*3/uL (ref 0.1–1.0)
Monocytes Relative: 9 %
Neutro Abs: 2.2 10*3/uL (ref 1.7–7.7)
Neutrophils Relative %: 51 %
Platelets: 138 10*3/uL — ABNORMAL LOW (ref 150–400)
RBC: 3.06 MIL/uL — ABNORMAL LOW (ref 4.22–5.81)
RDW: 15.2 % (ref 11.5–15.5)
WBC: 4.2 10*3/uL (ref 4.0–10.5)
nRBC: 0 % (ref 0.0–0.2)

## 2020-05-12 LAB — CULTURE, BLOOD (ROUTINE X 2)
Culture: NO GROWTH
Special Requests: ADEQUATE

## 2020-05-12 LAB — BASIC METABOLIC PANEL
Anion gap: 7 (ref 5–15)
BUN: 7 mg/dL — ABNORMAL LOW (ref 8–23)
CO2: 27 mmol/L (ref 22–32)
Calcium: 8.1 mg/dL — ABNORMAL LOW (ref 8.9–10.3)
Chloride: 107 mmol/L (ref 98–111)
Creatinine, Ser: 0.42 mg/dL — ABNORMAL LOW (ref 0.61–1.24)
GFR, Estimated: >60 mL/min (ref >60)
Glucose, Bld: 71 mg/dL (ref 70–99)
Potassium: 3.8 mmol/L (ref 3.5–5.1)
Sodium: 141 mmol/L (ref 135–145)

## 2020-05-12 LAB — MAGNESIUM: Magnesium: 2.3 mg/dL (ref 1.7–2.4)

## 2020-05-12 SURGERY — ESOPHAGOGASTRODUODENOSCOPY (EGD) WITH PROPOFOL
Anesthesia: General

## 2020-05-12 MED ORDER — GERHARDT'S BUTT CREAM
TOPICAL_CREAM | Freq: Four times a day (QID) | CUTANEOUS | Status: DC
  Administered 2020-05-13 – 2020-05-17 (×4): 1 via TOPICAL
  Filled 2020-05-12: qty 1

## 2020-05-12 MED ORDER — SODIUM CHLORIDE 0.9 % IV SOLN: INTRAVENOUS | Status: DC

## 2020-05-12 MED ORDER — LIDOCAINE HCL (CARDIAC) PF 100 MG/5ML IV SOSY
PREFILLED_SYRINGE | INTRAVENOUS | Status: DC | PRN
  Administered 2020-05-12: 100 mg via INTRAVENOUS

## 2020-05-12 MED ORDER — PROPOFOL 500 MG/50ML IV EMUL
INTRAVENOUS | Status: DC | PRN
  Administered 2020-05-12: 150 ug/kg/min via INTRAVENOUS

## 2020-05-12 MED ORDER — PROPOFOL 10 MG/ML IV BOLUS
INTRAVENOUS | Status: DC | PRN
  Administered 2020-05-12: 10 mg via INTRAVENOUS
  Administered 2020-05-12: 40 mg via INTRAVENOUS

## 2020-05-12 MED ORDER — SODIUM CHLORIDE 0.9 % IV SOLN
2.0000 g | Freq: Three times a day (TID) | INTRAVENOUS | Status: DC
  Administered 2020-05-12 – 2020-05-17 (×14): 2 g via INTRAVENOUS
  Filled 2020-05-12 (×17): qty 2

## 2020-05-12 NOTE — Transfer of Care (Signed)
Immediate Anesthesia Transfer of Care Note  Patient: [name]  Procedure(s) Performed: ESOPHAGOGASTRODUODENOSCOPY (EGD) WITH PROPOFOL (N/A ) COLONOSCOPY WITH PROPOFOL (N/A )  Patient Location: PACU  Anesthesia Type:General  Level of Consciousness: awake, drowsy and patient cooperative  Airway & Oxygen Therapy: Patient Spontanous Breathing and Patient connected to face mask oxygen  Post-op Assessment: Report given to RN and Post -op Vital signs reviewed and stable  Post vital signs: Reviewed and stable  Last Vitals:  Vitals Value Taken Time  BP 103/71 05/12/20 1013  Temp 36.4 C 05/12/20 1013  Pulse 74 05/12/20 1016  Resp 15 05/12/20 1016  SpO2 100 % 05/12/20 1016  Vitals shown include unvalidated device data.  Last Pain:  Vitals:   05/12/20 1013  TempSrc:   PainSc: Asleep         Complications: No complications documented.

## 2020-05-12 NOTE — Progress Notes (Signed)
PHARMACY - PHYSICIAN COMMUNICATION CRITICAL VALUE ALERT - BLOOD CULTURE IDENTIFICATION (BCID)  [name] is an 80 y.o. male who presented to [facility] on [facility]/07/2020 with a chief complaint of shortness of breath  Assessment:  Admitted on 1/14 with concerns for pneumonia.  Blood cultures from 1/14 updated today to P. Aeruginosa. Noted to have GIB during admission    Name of physician (or Provider) Contacted: Dr Roosevelt Locks  Current antibiotics: amoxicillin/clavulonate  Changes to prescribed antibiotics recommended:  Recommendations accepted by provider - change to cefpime  No results found for this or any previous visit.  Doreene Eland, PharmD, BCPS.   Work Cell: [phone] 05/12/2020 9:15 AM

## 2020-05-12 NOTE — Anesthesia Procedure Notes (Signed)
Procedure Name: General with mask airway Performed by: Fletcher-Harrison, Derico Mitton, CRNA Pre-anesthesia Checklist: Patient identified, Emergency Drugs available, Suction available and Patient being monitored Patient Re-evaluated:Patient Re-evaluated prior to induction Oxygen Delivery Method: Simple face mask Induction Type: IV induction Placement Confirmation: positive ETCO2 and CO2 detector       

## 2020-05-12 NOTE — Op Note (Signed)
[facility] Patient Name: [name] Procedure Date: 05/12/2020 9:39 AM MRN: [id number] Account #: [id number] Date of Birth: [birth year] Admit Type: Outpatient Age: 80 Room: [facility] ENDO ROOM 4 Gender: Male Note Status: Finalized Procedure:             Upper GI endoscopy Indications:           Acute post hemorrhagic anemia, Hematochezia Providers:             Lin Landsman MD, MD Medicines:             General Anesthesia Complications:         No immediate complications. Estimated blood loss: None. Procedure:             Pre-Anesthesia Assessment:                        - Prior to the procedure, a History and Physical was                         performed, and patient medications and allergies were                         reviewed. The patient is unable to give consent                         secondary to the patient being legally incompetent to                         consent. The risks and benefits of the procedure and                         the sedation options and risks were discussed with the                         patient's guardian. All questions were answered and                         informed consent was obtained. Patient identification                         and proposed procedure were verified by the physician,                         the nurse, the anesthesiologist, the anesthetist and                         the technician in the pre-procedure area in the                         procedure room in the endoscopy suite. Mental Status                         Examination: alert and oriented. Airway Examination:                         normal oropharyngeal airway and neck mobility.  Respiratory Examination: clear to auscultation. CV                         Examination: normal. Prophylactic Antibiotics: The                         patient does not require prophylactic antibiotics.                          Prior Anticoagulants: The patient has taken no                         previous anticoagulant or antiplatelet agents. ASA                         Grade Assessment: III - A patient with severe systemic                         disease. After reviewing the risks and benefits, the                         patient was deemed in satisfactory condition to                         undergo the procedure. The anesthesia plan was to use                         general anesthesia. Immediately prior to                         administration of medications, the patient was                         re-assessed for adequacy to receive sedatives. The                         heart rate, respiratory rate, oxygen saturations,                         blood pressure, adequacy of pulmonary ventilation, and                         response to care were monitored throughout the                         procedure. The physical status of the patient was                         re-assessed after the procedure.                        After obtaining informed consent, the endoscope was                         passed under direct vision. Throughout the procedure,                         the patient's blood pressure, pulse, and oxygen  saturations were monitored continuously. The Endoscope                         was introduced through the mouth, and advanced to the                         second part of duodenum. The upper GI endoscopy was                         accomplished without difficulty. The patient tolerated                         the procedure well. Findings:      The esophagus was normal.      The stomach was normal.      The examined duodenum was normal.      The cardia and gastric fundus were normal on retroflexion. Impression:            - Normal esophagus.                        - Normal stomach.                        - Normal examined duodenum.                        - No  specimens collected. Recommendation:        - Continue present medications.                        - Proceed with colonoscopy as scheduled                        See colonoscopy report Procedure Code(s):     --- Professional ---                        [phone], Esophagogastroduodenoscopy, flexible,                         transoral; diagnostic, including collection of                         specimen(s) by brushing or washing, when performed                         (separate procedure) Diagnosis Code(s):     --- Professional ---                        D62, Acute posthemorrhagic anemia                        K92.1, Melena (includes Hematochezia) CPT copyright 2019 American Medical Association. All rights reserved. The codes documented in this report are preliminary and upon coder review may  be revised to meet current compliance requirements. Dr. Ulyess Mort Lin Landsman MD, MD 05/12/2020 9:49:58 AM This report has been signed electronically. Number of Addenda: 0 Note Initiated On: 05/12/2020 9:39 AM Estimated Blood Loss:  Estimated blood loss: none.      [facility]

## 2020-05-12 NOTE — Anesthesia Preprocedure Evaluation (Addendum)
Anesthesia Evaluation  Patient identified by MRN, date of birth, ID band Patient confused    Reviewed: Allergy & Precautions, H&P , NPO status , Patient's Chart, lab work & pertinent test results, reviewed documented beta blocker date and time   History of Anesthesia Complications Negative for: history of anesthetic complications  Airway Mallampati: III  TM Distance: >3 FB Neck ROM: full    Dental  (+) Dental Advidsory Given   Pulmonary pneumonia, resolved,    Pulmonary exam normal        Cardiovascular Exercise Tolerance: Good hypertension, (-) angina+ CAD and + Peripheral Vascular Disease  (-) Past MI, (-) Cardiac Stents and (-) CABG Normal cardiovascular exam(-) dysrhythmias (-) Valvular Problems/Murmurs     Neuro/Psych neg Seizures PSYCHIATRIC DISORDERS Anxiety Depression Dementia Cerebral palsy and mental retardation  Neuromuscular disease    GI/Hepatic negative GI ROS, Neg liver ROS,   Endo/Other  negative endocrine ROS  Renal/GU Renal disease (kidney stones and cysts)  negative genitourinary   Musculoskeletal   Abdominal Normal abdominal exam  (+)   Peds  Hematology negative hematology ROS (+)   Anesthesia Other Findings Past Medical History: No date: Anxiety No date: Atrophy, disuse, muscle No date: BPH (benign prostatic hyperplasia) No date: Cerebral palsy (HCC) No date: Eczema No date: Elevated lipids No date: Elevated PSA No date: Exotropia No date: Hematuria, gross No date: Hyperlipidemia No date: Incontinence No date: Kidney stone No date: Lower extremity edema     Comment:  lymph drainage problems No date: Lower urinary tract symptoms No date: Lymphedema No date: Mental retardation No date: Over weight No date: Prostate cancer ([place]) No date: Urinary frequency No date: Urinary urgency No date: UTI (urinary tract infection) No date: Venous insufficiency No date: Vitamin D deficiency No  date: Wheelchair dependence   Reproductive/Obstetrics negative OB ROS                             Anesthesia Physical  Anesthesia Plan  ASA: III  Anesthesia Plan: General   Post-op Pain Management:    Induction: Intravenous  PONV Risk Score and Plan: 2  Airway Management Planned: Mask  Additional Equipment:   Intra-op Plan:   Post-operative Plan:   Informed Consent: I have reviewed the patients History and Physical, chart, labs and discussed the procedure including the risks, benefits and alternatives for the proposed anesthesia with the patient or authorized representative who has indicated his/her understanding and acceptance.     Dental Advisory Given  Plan Discussed with: Anesthesiologist, CRNA and Surgeon  Anesthesia Plan Comments: (Consent From legal guardian)       Anesthesia Quick Evaluation

## 2020-05-12 NOTE — Plan of Care (Signed)
  Problem: Education: Goal: Knowledge of General Education information will improve Description: Including pain rating scale, medication(s)/side effects and non-pharmacologic comfort measures 05/12/2020 1218 by Cristela Blue, RN Outcome: Progressing 05/12/2020 1218 by Cristela Blue, RN Outcome: Progressing   Problem: Health Behavior/Discharge Planning: Goal: Ability to manage health-related needs will improve 05/12/2020 1218 by Cristela Blue, RN Outcome: Progressing 05/12/2020 1218 by Cristela Blue, RN Outcome: Progressing   Problem: Clinical Measurements: Goal: Ability to maintain clinical measurements within normal limits will improve 05/12/2020 1218 by Cristela Blue, RN Outcome: Progressing 05/12/2020 1218 by Cristela Blue, RN Outcome: Progressing Goal: Will remain free from infection 05/12/2020 1218 by Cristela Blue, RN Outcome: Progressing 05/12/2020 1218 by Cristela Blue, RN Outcome: Progressing Goal: Diagnostic test results will improve 05/12/2020 1218 by Cristela Blue, RN Outcome: Progressing 05/12/2020 1218 by Cristela Blue, RN Outcome: Progressing Goal: Respiratory complications will improve 05/12/2020 1218 by Cristela Blue, RN Outcome: Progressing 05/12/2020 1218 by Cristela Blue, RN Outcome: Progressing Goal: Cardiovascular complication will be avoided 05/12/2020 1218 by Cristela Blue, RN Outcome: Progressing 05/12/2020 1218 by Cristela Blue, RN Outcome: Progressing   Problem: Activity: Goal: Risk for activity intolerance will decrease 05/12/2020 1218 by Cristela Blue, RN Outcome: Progressing 05/12/2020 1218 by Cristela Blue, RN Outcome: Progressing   Problem: Nutrition: Goal: Adequate nutrition will be maintained 05/12/2020 1218 by Cristela Blue, RN Outcome: Progressing 05/12/2020 1218 by Cristela Blue, RN Outcome: Progressing   Problem: Coping: Goal: Level of anxiety will decrease 05/12/2020 1218 by Cristela Blue, RN Outcome:  Progressing 05/12/2020 1218 by Cristela Blue, RN Outcome: Progressing   Problem: Elimination: Goal: Will not experience complications related to bowel motility 05/12/2020 1218 by Cristela Blue, RN Outcome: Progressing 05/12/2020 1218 by Cristela Blue, RN Outcome: Progressing Goal: Will not experience complications related to urinary retention 05/12/2020 1218 by Cristela Blue, RN Outcome: Progressing 05/12/2020 1218 by Cristela Blue, RN Outcome: Progressing   Problem: Pain Managment: Goal: General experience of comfort will improve 05/12/2020 1218 by Cristela Blue, RN Outcome: Progressing 05/12/2020 1218 by Cristela Blue, RN Outcome: Progressing   Problem: Safety: Goal: Ability to remain free from injury will improve 05/12/2020 1218 by Cristela Blue, RN Outcome: Progressing 05/12/2020 1218 by Cristela Blue, RN Outcome: Progressing   Problem: Skin Integrity: Goal: Risk for impaired skin integrity will decrease 05/12/2020 1218 by Cristela Blue, RN Outcome: Progressing 05/12/2020 1218 by Cristela Blue, RN Outcome: Progressing

## 2020-05-12 NOTE — Progress Notes (Addendum)
Patient did not finish Citrate and miralax bowel prep. PM dose of miralax was given to patient and patient was compliant with finishing the dose. Patient had 1 large BM this shift with no appearance of blood. Will continue to monitor.

## 2020-05-12 NOTE — TOC Progression Note (Addendum)
Transition of Care [facility]) - Progression Note    Patient Details  Name: [name] MRN: [id number] Date of Birth: 04-06-1941  Transition of Care [facility]) CM/SW Contact  Shelbie Ammons, RN Phone Number: 05/12/2020, 1:57 PM  Clinical Narrative:   RNCM received phone call from Gena Fray, nurse with [name] [phone], she is concerned because patient's guardian has reached out to her and said someone from the hospital had called and was attempting to discharge patient. Informed Butch Penny that RNCM had not spoken with niece/guardian since yesterday and that at this time there is no discharge plan. Butch Penny requested that she be contacted prior to discharge so that she can review patient to see if he is appropriate. She reports that there has been discussion previously that patient my require a higher level of care.     Expected Discharge Plan: Group Home Barriers to Discharge: Continued Medical Work up  Expected Discharge Plan and Services Expected Discharge Plan: Group Home       Living arrangements for the past 2 months: Group Home                                       Social Determinants of Health (SDOH) Interventions    Readmission Risk Interventions No flowsheet data found.

## 2020-05-12 NOTE — Op Note (Signed)
[facility] Patient Name: [name] Procedure Date: 05/12/2020 9:52 AM MRN: [id number] Account #: [id number] Date of Birth: 04-07-1941 Admit Type: Inpatient Age: 80 Room: [facility] ENDO ROOM 4 Gender: Male Note Status: Finalized Procedure:             Flexible Sigmoidoscopy Indications:           Rectal hemorrhage, Hematochezia Providers:             Lin Landsman MD, MD Medicines:             General Anesthesia Complications:         No immediate complications. Estimated blood loss: None. Procedure:             Pre-Anesthesia Assessment:                        - Prior to the procedure, a History and Physical was                         performed, and patient medications and allergies were                         reviewed. The patient is unable to give consent                         secondary to the patient being legally incompetent to                         consent. The risks and benefits of the procedure and                         the sedation options and risks were discussed with the                         patient's guardian. All questions were answered and                         informed consent was obtained. Patient identification                         and proposed procedure were verified by the physician,                         the nurse, the anesthesiologist, the anesthetist and                         the technician in the pre-procedure area in the                         procedure room in the endoscopy suite. Mental Status                         Examination: alert and oriented. Airway Examination:                         normal oropharyngeal airway and neck mobility.                         Respiratory Examination: clear  to auscultation. CV                         Examination: normal. Prophylactic Antibiotics: The                         patient does not require prophylactic antibiotics.                         Prior  Anticoagulants: The patient has taken no                         previous anticoagulant or antiplatelet agents. ASA                         Grade Assessment: III - A patient with severe systemic                         disease. After reviewing the risks and benefits, the                         patient was deemed in satisfactory condition to                         undergo the procedure. The anesthesia plan was to use                         general anesthesia. Immediately prior to                         administration of medications, the patient was                         re-assessed for adequacy to receive sedatives. The                         heart rate, respiratory rate, oxygen saturations,                         blood pressure, adequacy of pulmonary ventilation, and                         response to care were monitored throughout the                         procedure. The physical status of the patient was                         re-assessed after the procedure.                        After obtaining informed consent, the scope was passed                         under direct vision. The Endoscope was introduced                         through the anus The procedure was aborted. The scope  was not inserted. sigmoid colon Medications were                         rectosigmoid junction. The flexible sigmoidoscopy was                         extremely difficult due to poor bowel prep with stool                         present. The patient tolerated the procedure well. The                         quality of the bowel preparation was poor. The                         flexible sigmoidoscopy was aborted due to poor bowel                         prep with stool present. Findings:      The perianal and digital rectal examinations were normal. Pertinent       negatives include normal sphincter tone and no palpable rectal lesions.      Normal mucosa was found in the  rectum and in the recto-sigmoid colon.      Copious quantities of semi-solid solid stool was found in the rectum and       in the recto-sigmoid colon, precluding visualization. Impression:            - Preparation of the colon was poor.                        - The procedure was aborted due to poor bowel prep                         with stool present.                        - Normal mucosa in the rectum and in the recto-sigmoid                         colon.                        - Stool in the rectum and in the recto-sigmoid colon.                        - No specimens collected. Recommendation:        - Return patient to hospital ward for possible                         discharge same day.                        - Resume previous diet today. Procedure Code(s):     --- Professional ---                        [phone], 52, Sigmoidoscopy, flexible; diagnostic,  including collection of specimen(s) by brushing or                         washing, when performed (separate procedure) Diagnosis Code(s):     --- Professional ---                        K62.5, Hemorrhage of anus and rectum                        K92.1, Melena (includes Hematochezia) CPT copyright 2019 American Medical Association. All rights reserved. The codes documented in this report are preliminary and upon coder review may  be revised to meet current compliance requirements. Dr. Ulyess Mort Lin Landsman MD, MD 05/12/2020 10:01:25 AM This report has been signed electronically. Number of Addenda: 0 Note Initiated On: 05/12/2020 9:52 AM Scope Withdrawal Time: 0 hours 0 minutes 1 second  Total Procedure Duration: 0 hours 1 minute 31 seconds  Estimated Blood Loss:  Estimated blood loss: none.      [facility]

## 2020-05-12 NOTE — Progress Notes (Addendum)
Informed consent for upper endoscopy procedure given from  [name] (patients legal guardian) via telephone this morning.

## 2020-05-12 NOTE — Progress Notes (Signed)
Pharmacy Antibiotic Note  [name] is a 80 y.o. male admitted on 05/07/2020 with bacteremia.  Pharmacy has been consulted for cefepime dosing.  Patient with pseudomonas in blood cultures from 1/14 that became positive on 1/19.    Today, 05/12/2020  Day 1 cefepime  WBC WNL - improved with ampicillin/sulbactam and then amox/clav  Afebrile  1/14 Blood culture 1/2 sets (both bottles of 1 set) with Pseudomonas.  Interestingly, came positive on 5th day of incubation  Plan:  Cefepime 2gm IV q8h for pseudomonas aeruginosa bacteremia  Susceptibilities pending   Follow-up repeat blood culture  Weight: 61.5 kg (135 lb 9.3 oz)  Temp (24hrs), Avg:97.9 F (36.6 C), Min:97.3 F (36.3 C), Max:99.2 F (37.3 C)  Recent Labs  Lab 05/07/20 0740 05/08/20 0249 05/09/20 1035 05/10/20 0620 05/11/20 0602 05/12/20 0559  WBC 24.1* 18.7* 6.7 4.7 4.2 4.2  CREATININE 0.72 0.44* 0.48* 0.43* 0.31* 0.42*  LATICACIDVEN 1.2  --   --   --   --   --     Estimated Creatinine Clearance: 67.3 mL/min (A) (by C-G formula based on SCr of 0.42 mg/dL (L)).    No Known Allergies  Azithromycin 1/14 x 1 Cefepime/Vanc 1/14>1/15; 1/19 >> Unasyn/augmentin 1/15>>1/19  1/14 Bcx: pseudomonas 1/14 MRSA PCR: negative  Thank you for allowing pharmacy to be a part of this patient's care.  Doreene Eland, PharmD, BCPS.   Work Cell: [phone] 05/12/2020 4:24 PM

## 2020-05-12 NOTE — Progress Notes (Addendum)
PROGRESS NOTE    [name]  [id number] DOB: 07-31-1940 DOA: 05/07/2020 PCP: Steele Sizer, MD   Chief complaint.  Rectal bleeding. Brief Narrative:  Kevin A Andrewsis a 80 y.o.malewith medical history significant ofcerebral palsy,nonverbal at baseline, hyperlipidemia, wheelchair-bound, prostate cancer, BPH, lymphedema, PAD, depression, anxiety, who presents withSOB.He had a transient hypoxemia, later improved. Patient also had a significant leukocytosis, he was diagnosed with healthcare associated pneumonia, was treated with cefepime and vancomycin. Patient developed fresh red blood per rectum on 1/15, GI consult is obtained. Patient also received3unitsof PRBC.   Assessment & Plan:   Principal Problem:   HCAP (healthcare-associated pneumonia) Active Problems:   Cerebral palsy (HCC)   BPH (benign prostatic hyperplasia)   Hyperlipidemia   Depression with anxiety   Acute metabolic encephalopathy   Hematochezia   Acute blood loss anemia   Rectal bleeding  #1.  Healthcare associated pneumonia. Most likely aspiration pneumonia Pseudomonas bacteremia. Acute metabolic encephalopathy. Cerebral palsy and quadriplegia. Patient had a blood culture performed 5 days ago, just come back with Pseudomonas in 1 of 2 bottles.  I will switch back to cefepime for coverage.  Redraw 2 sets of blood culture today.  Pending first culture final results. If a second set is negative, I may opt to treat with oral Cipro if that is susceptible. Continue cefepime.  2.  Acute blood loss anemia secondary to rectal bleeding. Fresh red blood per rectum. EGD with without abnormality.  Sigmoid colonoscopy did not show any abnormality, full colonoscopy could not be performed due to poor prep. Currently, patient bleeding seems to be stopped. Continue to monitor.  3.  Hypokalemia. Hypophosphatemia Condition had improved.     DVT prophylaxis: SCDs Code Status: Full Family  Communication: guardian updated. Disposition Plan:  .   Status is: Inpatient  Remains inpatient appropriate because:Inpatient level of care appropriate due to severity of illness   Dispo: The patient is from: Home              Anticipated d/c is to: Home              Anticipated d/c date is: 2 days              Patient currently is not medically stable to d/c.        I/O last 3 completed shifts: In: -  Out: 3100 [Urine:3100] Total I/O In: 600 [I.V.:600] Out: -      Consultants:   GI  Procedures: EGD, Colonoscopy  Antimicrobials: None  Subjective: Patient having a good appetite today, no additional rectal bleeding. No short of breath or cough. No fever or chills.  Objective: Vitals:   05/12/20 1013 05/12/20 1023 05/12/20 1033 05/12/20 1215  BP: 103/71 118/90 131/90 122/77  Pulse: 76 76 80 63  Resp: 14 14 16 15   Temp: 97.6 F (36.4 C)   (!) 97.3 F (36.3 C)  TempSrc:      SpO2: 100% 100% 99% 94%  Weight:        Intake/Output Summary (Last 24 hours) at 05/12/2020 1406 Last data filed at 05/12/2020 0956 Gross per 24 hour  Intake 600 ml  Output 400 ml  Net 200 ml   Filed Weights   05/07/20 0737  Weight: 61.5 kg    Examination:  General exam: Appears calm and comfortable  Respiratory system: Clear to auscultation. Respiratory effort normal. Cardiovascular system: S1 & S2 heard, RRR. No JVD, murmurs, rubs, gallops or clicks. No pedal edema. Gastrointestinal system: Abdomen is  nondistended, soft and nontender. No organomegaly or masses felt. Normal bowel sounds heard. Central nervous system: Alert and oriented x1. No focal neurological deficits. Extremities: Symmetric 5 x 5 power. Skin: No rashes, lesions or ulcers Psychiatry:  Mood & affect appropriate.     Data Reviewed: I have personally reviewed following labs and imaging studies  CBC: Recent Labs  Lab 05/07/20 0740 05/08/20 0249 05/08/20 1251 05/09/20 1035 05/09/20 1706  05/09/20 2233 05/10/20 0620 05/10/20 1151 05/11/20 0602 05/12/20 0559  WBC 24.1* 18.7*  --  6.7  --   --  4.7  --  4.2 4.2  NEUTROABS 21.1*  --   --  4.8  --   --  2.7  --  2.3 2.2  HGB 10.6* 9.2*   < > 9.5*   < > 9.6* 8.9* 9.8* 9.0* 9.3*  HCT 32.3* 28.2*   < > 26.8*   < > 28.7* 26.7* 28.3* 26.7* 28.2*  MCV 96.4 97.9  --  89.6  --   --  90.5  --  89.9 92.2  PLT 201 156  --  143*  --   --  120*  --  125* 138*   < > = values in this interval not displayed.   Basic Metabolic Panel: Recent Labs  Lab 05/08/20 0249 05/09/20 1035 05/10/20 0620 05/11/20 0602 05/12/20 0559  NA 140 138 140 142 141  K 3.4* 2.9* 3.3* 3.4* 3.8  CL 107 107 108 109 107  CO2 22 25 26 26 27   GLUCOSE 82 115* 74 74 71  BUN 23 22 17 11  7*  CREATININE 0.44* 0.48* 0.43* 0.31* 0.42*  CALCIUM 8.0* 7.7* 7.8* 7.9* 8.1*  MG  --  1.6* 1.7 2.0 2.3  PHOS  --   --  2.6  --   --    GFR: Estimated Creatinine Clearance: 67.3 mL/min (A) (by C-G formula based on SCr of 0.42 mg/dL (L)). Liver Function Tests: Recent Labs  Lab 05/07/20 0740  AST 14*  ALT 9  ALKPHOS 93  BILITOT 1.1  PROT 6.3*  ALBUMIN 2.7*   No results for input(s): LIPASE, AMYLASE in the last 168 hours. No results for input(s): AMMONIA in the last 168 hours. Coagulation Profile: No results for input(s): INR, PROTIME in the last 168 hours. Cardiac Enzymes: No results for input(s): CKTOTAL, CKMB, CKMBINDEX, TROPONINI in the last 168 hours. BNP (last 3 results) No results for input(s): PROBNP in the last 8760 hours. HbA1C: No results for input(s): HGBA1C in the last 72 hours. CBG: No results for input(s): GLUCAP in the last 168 hours. Lipid Profile: No results for input(s): CHOL, HDL, LDLCALC, TRIG, CHOLHDL, LDLDIRECT in the last 72 hours. Thyroid Function Tests: No results for input(s): TSH, T4TOTAL, FREET4, T3FREE, THYROIDAB in the last 72 hours. Anemia Panel: Recent Labs    05/10/20 0620  VITAMINB12 774  FOLATE 5.8*  FERRITIN 147  TIBC  210*  IRON 118   Sepsis Labs: Recent Labs  Lab 05/07/20 0740 05/08/20 0249 05/09/20 1035  PROCALCITON 0.96 0.70 0.36  LATICACIDVEN 1.2  --   --     Recent Results (from the past 240 hour(s))  Culture, blood (routine x 2)     Status: Abnormal (Preliminary result)   Collection Time: 05/07/20  7:40 AM   Specimen: BLOOD  Result Value Ref Range Status   Specimen Description   Final    BLOOD LEFT ANTECUBITAL Performed at [facility], [address] [place], [place] [address]  Special Requests   Final    BOTTLES DRAWN AEROBIC AND ANAEROBIC Blood Culture results may not be optimal due to an excessive volume of blood received in culture bottles Performed at [facility], [address] [place], [place] [address]    Culture  Setup Time   Final    GRAM NEGATIVE RODS IN BOTH AEROBIC AND ANAEROBIC BOTTLES CRITICAL RESULT CALLED TO, READ BACK BY AND VERIFIED WITH: Robben Dalton PHARMD @0821  05/12/20 EB    Culture (A)  Final    PSEUDOMONAS AERUGINOSA SUSCEPTIBILITIES TO FOLLOW Performed at [facility], [place] [address], [place], [place] [address]    Report Status PENDING  Incomplete  Culture, blood (routine x 2)     Status: None   Collection Time: 05/07/20  7:40 AM   Specimen: BLOOD  Result Value Ref Range Status   Specimen Description BLOOD RIGHT ANTECUBITAL  Final   Special Requests   Final    BOTTLES DRAWN AEROBIC AND ANAEROBIC Blood Culture adequate volume   Culture   Final    NO GROWTH 5 DAYS Performed at [facility], [address] [place], [place] [address]    Report Status 05/12/2020 FINAL  Final  Resp Panel by RT-PCR (Flu A&B, Covid) Nasopharyngeal Swab     Status: None   Collection Time: 05/07/20  8:26 AM   Specimen: Nasopharyngeal Swab; Nasopharyngeal(NP) swabs in vial transport medium  Result Value Ref Range Status   SARS Coronavirus 2 by RT PCR NEGATIVE NEGATIVE Final    Comment: (NOTE) SARS-CoV-2 target nucleic acids are  NOT DETECTED.  The SARS-CoV-2 RNA is generally detectable in upper respiratory specimens during the acute phase of infection. The lowest concentration of SARS-CoV-2 viral copies this assay can detect is 138 copies/mL. A negative result does not preclude SARS-Cov-2 infection and should not be used as the sole basis for treatment or other patient management decisions. A negative result may occur with  improper specimen collection/handling, submission of specimen other than nasopharyngeal swab, presence of viral mutation(s) within the areas targeted by this assay, and inadequate number of viral copies(<138 copies/mL). A negative result must be combined with clinical observations, patient history, and epidemiological information. The expected result is Negative.  Fact Sheet for Patients:  EntrepreneurPulse.com.au  Fact Sheet for Healthcare Providers:  IncredibleEmployment.be  This test is no t yet approved or cleared by the [place] FDA and  has been authorized for detection and/or diagnosis of SARS-CoV-2 by FDA under an Emergency Use Authorization (EUA). This EUA will remain  in effect (meaning this test can be used) for the duration of the COVID-19 declaration under Section 564(b)(1) of the Act, 21 U.S.C.section 360bbb-3(b)(1), unless the authorization is terminated  or revoked sooner.       Influenza A by PCR NEGATIVE NEGATIVE Final   Influenza B by PCR NEGATIVE NEGATIVE Final    Comment: (NOTE) The Xpert Xpress SARS-CoV-2/FLU/RSV plus assay is intended as an aid in the diagnosis of influenza from Nasopharyngeal swab specimens and should not be used as a sole basis for treatment. Nasal washings and aspirates are unacceptable for Xpert Xpress SARS-CoV-2/FLU/RSV testing.  Fact Sheet for Patients: EntrepreneurPulse.com.au  Fact Sheet for Healthcare Providers: IncredibleEmployment.be  This test is not  yet approved or cleared by the [place] FDA and has been authorized for detection and/or diagnosis of SARS-CoV-2 by FDA under an Emergency Use Authorization (EUA). This EUA will remain in effect (meaning this test can be used) for the duration  of the COVID-19 declaration under Section 564(b)(1) of the Act, 21 U.S.C. section 360bbb-3(b)(1), unless the authorization is terminated or revoked.  Performed at [facility], [address] [place], [place] [address]   MRSA PCR Screening     Status: Abnormal   Collection Time: 05/11/20  2:26 AM   Specimen: Nasopharyngeal  Result Value Ref Range Status   MRSA by PCR (A) NEGATIVE Final    INVALID, UNABLE TO DETERMINE THE PRESENCE OF TARGET DUE TO SPECIMEN INTEGRITY. RECOLLECTION REQUESTED.    Comment:        The GeneXpert MRSA Assay (FDA approved for NASAL specimens only), is one component of a comprehensive MRSA colonization surveillance program. It is not intended to diagnose MRSA infection nor to guide or monitor treatment for MRSA infections. REQUEST FOR RECOLLECT CALLED TO JACKIE ATWATER AT [id number] ON 05/11/2020 [place]. Performed at [facility], [address] [place], [place] [address]   MRSA PCR Screening     Status: None   Collection Time: 05/11/20  2:14 PM  Result Value Ref Range Status   MRSA by PCR NEGATIVE NEGATIVE Final    Comment:        The GeneXpert MRSA Assay (FDA approved for NASAL specimens only), is one component of a comprehensive MRSA colonization surveillance program. It is not intended to diagnose MRSA infection nor to guide or monitor treatment for MRSA infections. Performed at [facility], [address] [place], [place] [address]          Radiology Studies: CT Angio Abd/Pel w/ and/or w/o  Result Date: 05/10/2020 CLINICAL DATA:  Lower GI bleeding.  Colonoscopy earlier today. EXAM: CTA ABDOMEN AND PELVIS WITHOUT AND WITH CONTRAST TECHNIQUE: Multidetector CT imaging  of the abdomen and pelvis was performed using the standard protocol during bolus administration of intravenous contrast. Multiplanar reconstructed images and MIPs were obtained and reviewed to evaluate the vascular anatomy. CONTRAST:  170mL OMNIPAQUE IOHEXOL 350 MG/ML SOLN COMPARISON:  Nuclear medicine tagged red blood cell study-05/09/2020; CT abdomen pelvis-07/02/2017; abdominal MRI-08/06/2017 FINDINGS: VASCULAR Aorta: Scattered minimal amount of eccentric calcified and noncalcified atherosclerotic plaque within normal caliber abdominal aorta, not resulting in a hemodynamically significant narrowing. No evidence of abdominal aortic dissection or periaortic stranding. Celiac: Widely patent without a hemodynamically significant narrowing. An accessory left hepatic artery is noted to arise from the left gastric artery. Otherwise, conventional branching pattern. SMA: Widely patent without hemodynamically significant narrowing. Conventional branching pattern. The distal tributaries of the SMA are widely patent without discrete lumen filling defect. Renals: The right renal artery is duplicated with accessory, nearly could dominant right renal artery arising superior to the takeoff of the IMA and supplying the inferior pole the right kidney. Both dominant renal arteries are widely patent without hemodynamically significant narrowing. No vessel irregularity to suggest FMD. IMA: Widely patent without hemodynamically significant narrowing. Inflow: There is a minimal amount of eccentric mixed calcified and noncalcified atherosclerotic plaque involving the bilateral normal caliber common iliac arteries, not resulting in hemodynamically significant stenosis. The bilateral internal iliac arteries are disease though patent and of normal caliber. The bilateral external iliac arteries are tortuous though of normal caliber and widely patent without hemodynamically significant narrowing. Proximal Outflow: The bilateral common and  imaged portions of the bilateral deep and superficial femoral arteries are widely patent without hemodynamically significant narrowing. Veins: The IVC and pelvic venous systems appear widely patent. Review of the MIP images confirms the above findings. _________________________________________________________ NON-VASCULAR Lower chest: Limited visualization of the  lower thorax demonstrates trace bilateral effusions with associated bibasilar opacities, likely atelectasis. Borderline cardiomegaly. No pericardial effusion. Hepatobiliary: Normal hepatic contour. No discrete hepatic lesions. Gallbladder appears mildly distended though without radiopaque gallstones or biliary sludge. No definitive evidence of gallbladder wall thickening. No definite intra or extrahepatic biliary ductal dilatation. No ascites. Pancreas: Normal appearance of the pancreas. Spleen: Normal appearance of the spleen. Adrenals/Urinary Tract: There is symmetric enhancement and excretion of the bilateral kidneys. Multiple stones are seen within the right renal collecting system, pelvis and superior aspect of the right ureter with dominant stone within the superior aspect of the right ureter measuring approximately 0.7 cm in maximal diameter (image 44, series 2), not resulting in urinary obstruction. Note is made of a solitary punctate (approximately 3 mm) nonobstructing stone within the interpolar aspect the left kidney (image 29, series 2). Bilateral hypoattenuating nonenhancing renal cysts are seen bilaterally with dominant left-sided parapelvic cyst measuring 2.0 cm in diameter (image 28, series 6 and dominant partially exophytic cyst arising from the anterior inferior aspect the right kidney measuring 2.5 cm in diameter. No discrete worrisome renal lesions. No urinary obstruction or perinephric stranding. Normal appearance of the bilateral adrenal glands. There is diffuse thickening the urinary bladder wall with mild irregular thickening and  potential trabeculation. Stomach/Bowel: Linear high density debris is seen within the cecum and ascending colon. Moderate colonic stool burden without evidence of enteric obstruction. There are no discrete areas of pooling intraluminal contrast extravasation to suggest the etiology of reported history of lower GI bleeding. There is diffuse wall thickening involving the cecum and proximal ascending colon as well as the distal sigmoid colon and rectum, not resulting in enteric obstruction. No significant hiatal hernia. No pneumoperitoneum, pneumatosis or portal venous gas. Lymphatic: No bulky retroperitoneal, mesenteric, pelvic or inguinal lymphadenopathy. Reproductive: Brachytherapy seeds are seen within the prostate bed. Small amount of fluid is seen with the pelvic cul-de-sac. Other: Small right-sided direct inguinal hernia with small amount of fluid seen within the hernia as well as suspected small varicocele. Diffuse body wall anasarca. Musculoskeletal: No definite acute or aggressive osseous abnormalities. Redemonstrated bilateral L5 pars defects with associated grade 1 anterolisthesis of L5 upon S1 measuring approximately 8 mm, similar to the 2019 examination. There is partial lumbarization of the S1 vertebral body. Stigmata of dish within the lower thoracic and upper lumbar spine. Mild degenerative change the bilateral hips with joint space loss, subchondral sclerosis and osteophytosis. Deformity involving the posterior aspect of the right twelfth rib (image 24, series 2), likely the sequela of remote fracture. IMPRESSION: 1. No discrete areas of intraluminal contrast extravasation to suggest the etiology of reported history of lower GI bleeding. 2. Minimal amount of atherosclerotic plaque with a normal caliber abdominal aorta, not resulting in a hemodynamically significant stenosis. 3.  Aortic Atherosclerosis (ICD10-I70.0). NON-VASCULAR. 1. Diffuse wall thickening involving the cecum and proximal ascending  colon as well as the distal sigmoid colon and rectum, nonspecific though suggestive of an enteritis. No evidence of enteric obstruction. 2. Extensive nonobstructing right-sided nephrolithiasis with solitary punctate (3 mm) nonobstructing left-sided renal stone. 3. Small right-sided direct inguinal hernia containing a small amount of fluid as well as suspected small varicocele. 4. Brachytherapy seeds within the prostate bed. 5. Diffuse thickening of the urinary bladder wall with mild irregular thickening and potential trabeculation - clinical correlation is advised. Further evaluation with nonemergent urinalysis and/or cystoscopy could be performed as indicated. 6. Bilateral L5 pars defects with associated grade 1 anterolisthesis of L5 upon S1,  similar to the 2019 examination. Electronically Signed   By: Sandi Mariscal M.D.   On: 05/10/2020 15:12        Scheduled Meds: . alfuzosin  10 mg Oral Daily  . ascorbic acid  500 mg Oral Daily  . aspirin EC  81 mg Oral Daily  . atorvastatin  40 mg Oral QPM  . calcium-vitamin D  1 tablet Oral BID  . enoxaparin (LOVENOX) injection  40 mg Subcutaneous Q24H  . folic acid  1 mg Oral Daily  . Gerhardt's butt cream   Topical QID  . lubiprostone  24 mcg Oral BID WC  . mouth rinse  15 mL Mouth Rinse BID  . pantoprazole (PROTONIX) IV  40 mg Intravenous Q12H  . polyethylene glycol  34 g Oral BID  . Ensure Max Protein  1 each Oral BID  . sertraline  100 mg Oral Daily   Continuous Infusions: . sodium chloride 150 mL/hr at 05/10/20 1331  . sodium chloride 20 mL/hr at 05/12/20 1133     LOS: 5 days    Time spent: 27 minutes    Sharen Hones, MD [facility]   To contact the attending provider between 7A-7P or the covering provider during after hours 7P-7A, please log into the web site [email or web address] and access using universal [name] password for that web site. If you do not have the password, please call the hospital operator.  05/12/2020, 2:06 PM

## 2020-05-13 ENCOUNTER — Encounter: Payer: Self-pay | Admitting: Gastroenterology

## 2020-05-13 ENCOUNTER — Inpatient Hospital Stay: Payer: Medicare Other | Admitting: Oncology

## 2020-05-13 ENCOUNTER — Inpatient Hospital Stay: Payer: Medicare Other

## 2020-05-13 LAB — CBC WITH DIFFERENTIAL/PLATELET
Abs Immature Granulocytes: 0.03 10*3/uL (ref 0.00–0.07)
Basophils Absolute: 0 10*3/uL (ref 0.0–0.1)
Basophils Relative: 1 %
Eosinophils Absolute: 0.2 10*3/uL (ref 0.0–0.5)
Eosinophils Relative: 4 %
HCT: 33.8 % — ABNORMAL LOW (ref 39.0–52.0)
Hemoglobin: 11.1 g/dL — ABNORMAL LOW (ref 13.0–17.0)
Immature Granulocytes: 1 %
Lymphocytes Relative: 37 %
Lymphs Abs: 1.6 10*3/uL (ref 0.7–4.0)
MCH: 30.8 pg (ref 26.0–34.0)
MCHC: 32.8 g/dL (ref 30.0–36.0)
MCV: 93.9 fL (ref 80.0–100.0)
Monocytes Absolute: 0.3 10*3/uL (ref 0.1–1.0)
Monocytes Relative: 8 %
Neutro Abs: 2.2 10*3/uL (ref 1.7–7.7)
Neutrophils Relative %: 49 %
Platelets: 171 10*3/uL (ref 150–400)
RBC: 3.6 MIL/uL — ABNORMAL LOW (ref 4.22–5.81)
RDW: 14.6 % (ref 11.5–15.5)
WBC: 4.4 10*3/uL (ref 4.0–10.5)
nRBC: 0 % (ref 0.0–0.2)

## 2020-05-13 LAB — CULTURE, BLOOD (ROUTINE X 2)

## 2020-05-13 LAB — LEGIONELLA PNEUMOPHILA SEROGP 1 UR AG: L. pneumophila Serogp 1 Ur Ag: NEGATIVE

## 2020-05-13 MED ORDER — SODIUM CHLORIDE 0.9 % IV BOLUS
500.0000 mL | Freq: Once | INTRAVENOUS | Status: AC
  Administered 2020-05-13: 500 mL via INTRAVENOUS

## 2020-05-13 MED ORDER — PANTOPRAZOLE SODIUM 40 MG PO TBEC
40.0000 mg | DELAYED_RELEASE_TABLET | Freq: Two times a day (BID) | ORAL | Status: DC
  Administered 2020-05-13 – 2020-05-17 (×8): 40 mg via ORAL
  Filled 2020-05-13 (×8): qty 1

## 2020-05-13 MED ORDER — POLYETHYLENE GLYCOL 3350 17 G PO PACK: 34.0000 g | PACK | Freq: Two times a day (BID) | ORAL | 0 refills | Status: DC | PRN

## 2020-05-13 MED ORDER — CIPROFLOXACIN HCL 500 MG PO TABS: 500.0000 mg | ORAL_TABLET | Freq: Two times a day (BID) | ORAL | 0 refills | Status: DC

## 2020-05-13 NOTE — NC FL2 (Signed)
[place] LEVEL OF CARE SCREENING TOOL     IDENTIFICATION  Patient Name: [name] Birthdate: [birth year] Sex: male Admission Date (Current Location): 05/07/2020  [place] and [place] Number:  Engineering geologist and Address:  [facility], [address] [place], [place] [address]      Provider Number: [id number]  Attending Physician Name and Address:  Sharen Hones, MD  Relative Name and Phone Number:       Current Level of Care: Hospital Recommended Level of Care: Other (Comment) ([place]) Prior Approval Number:    Date Approved/Denied:   PASRR Number:    Discharge Plan: Other (Comment) (Family Care/Group home)    Current Diagnoses: Patient Active Problem List   Diagnosis Date Noted  . Acute blood loss anemia 05/09/2020  . Rectal bleeding 05/09/2020  . Hematochezia   . HCAP (healthcare-associated pneumonia) 05/07/2020  . BPH (benign prostatic hyperplasia)   . Hyperlipidemia   . Depression with anxiety   . Acute metabolic encephalopathy   . Protein-calorie malnutrition, severe 04/22/2020  . Pressure injury of skin 04/22/2020  . Multifocal pneumonia   . Sepsis due to undetermined organism ([place]) 04/19/2020  . Personal history of urinary calculi 06/21/2019  . PAD (peripheral artery disease) ([place]) 07/04/2017  . Aortic valve calcification 07/04/2017  . Coronary artery calcification 07/04/2017  . Late effect acute polio 05/17/2016  . Chronic constipation 11/19/2015  . Mild major depression ([place]) 11/19/2015  . Bacteriuria, chronic 09/11/2015  . History of prostate cancer 03/02/2015  . Amyotrophia 03/02/2015  . Edema leg 03/02/2015  . Cerebral palsy ([place]) 03/02/2015  . Dyslipidemia 03/02/2015  . Dermatitis, eczematoid 03/02/2015  . 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 H/O acute poliomyelitis 03/02/2015  . Lymphedema 03/02/2015  . Intellectual disability 03/02/2015  . Hypertrophy of nail 03/02/2015  .  Chronic venous insufficiency 03/02/2015  . Avitaminosis D 03/02/2015  . Adynamia 03/02/2015  . Dependent on wheelchair 03/02/2015  . Renal cysts, acquired, bilateral 03/02/2015  . At risk for falling 03/02/2015  . Cerebral vascular disease 03/02/2015  . Strabismus 02/11/2015  . Urge incontinence of urine 02/11/2015  . Wheelchair dependence 02/11/2015  . Urinary frequency 10/30/2014    Orientation RESPIRATION BLADDER Height & Weight     Self,Time,Situation,Place  Normal Incontinent Weight: 61.5 kg Height:     BEHAVIORAL SYMPTOMS/MOOD NEUROLOGICAL BOWEL NUTRITION STATUS      Incontinent Diet (Regular)  AMBULATORY STATUS COMMUNICATION OF NEEDS Skin   Extensive Assist Verbally PU Stage and Appropriate Care                       Personal Care Assistance Level of Assistance  Bathing,Feeding,Dressing Bathing Assistance: Maximum assistance Feeding assistance: Limited assistance Dressing Assistance: Maximum assistance     Functional Limitations Info             SPECIAL CARE FACTORS FREQUENCY                       Contractures Contractures Info: Not present    Additional Factors Info  Code Status,Allergies Code Status Info: Full Allergies Info: No known allergies           Current Medications (05/13/2020):  This is the current hospital active medication list Current Facility-Administered Medications  Medication Dose Route Frequency Provider Last Rate Last Admin  . 0.9 %  sodium chloride infusion   Intravenous PRN Ivor Costa, MD 150 mL/hr at 05/10/20 1331 Restarted at  05/10/20 1345  . 0.9 %  sodium chloride infusion   Intravenous Continuous Lin Landsman, MD 20 mL/hr at 05/12/20 1133 New Bag at 05/12/20 1133  . acetaminophen (TYLENOL) suppository 650 mg  650 mg Rectal Q6H PRN Ivor Costa, MD      . acetaminophen (TYLENOL) tablet 650 mg  650 mg Oral Q6H PRN Ivor Costa, MD   650 mg at 05/12/20 2125  . alfuzosin (UROXATRAL) 24 hr tablet 10 mg  10 mg Oral  Daily Ivor Costa, MD   10 mg at 05/13/20 9373  . ALPRAZolam Duanne Moron) tablet 0.25 mg  0.25 mg Oral BID PRN Ivor Costa, MD   0.25 mg at 05/09/20 0505  . ascorbic acid (VITAMIN C) tablet 500 mg  500 mg Oral Daily Ivor Costa, MD   500 mg at 05/13/20 4287  . aspirin EC tablet 81 mg  81 mg Oral Daily Ivor Costa, MD   81 mg at 05/13/20 6811  . atorvastatin (LIPITOR) tablet 40 mg  40 mg Oral QPM Ivor Costa, MD   40 mg at 05/12/20 1802  . calcium-vitamin D (OSCAL WITH D) 500-200 MG-UNIT per tablet 1 tablet  1 tablet Oral BID Ivor Costa, MD   1 tablet at 05/13/20 [phone]  . ceFEPIme (MAXIPIME) 2 g in sodium chloride 0.9 % 100 mL IVPB  2 g Intravenous Q8H Zeigler, Sandi Mealy, RPH 200 mL/hr at 05/13/20 1556 2 g at 05/13/20 1556  . enoxaparin (LOVENOX) injection 40 mg  40 mg Subcutaneous Q24H Sharen Hones, MD   40 mg at 05/13/20 0834  . folic acid (FOLVITE) tablet 1 mg  1 mg Oral Daily Lin Landsman, MD   1 mg at 05/13/20 2035  . Gerhardt's butt cream   Topical QID Sharion Settler, NP   Given at 05/13/20 1556  . lubiprostone (AMITIZA) capsule 24 mcg  24 mcg Oral BID WC Ivor Costa, MD   24 mcg at 05/13/20 [phone]  . MEDLINE mouth rinse  15 mL Mouth Rinse BID Sharen Hones, MD   15 mL at 05/13/20 0835  . ondansetron (ZOFRAN) injection 4 mg  4 mg Intravenous Q8H PRN Ivor Costa, MD      . pantoprazole (PROTONIX) EC tablet 40 mg  40 mg Oral BID Virl Cagey E, RPH      . polyethylene glycol (MIRALAX / GLYCOLAX) packet 34 g  34 g Oral BID Lin Landsman, MD   34 g at 05/13/20 1638  . polyvinyl alcohol (LIQUIFILM TEARS) 1.4 % ophthalmic solution 2 drop  2 drop Both Eyes BID PRN Ivor Costa, MD      . protein supplement (ENSURE MAX) liquid  1 each Oral BID Ivor Costa, MD   330 mL at 05/13/20 0835  . sertraline (ZOLOFT) tablet 100 mg  100 mg Oral Daily Ivor Costa, MD   100 mg at 05/13/20 4536     Discharge Medications: Please see discharge summary for a list of discharge medications.  Relevant Imaging  Results:  Relevant Lab Results:   Additional Information [id number]  Shelbie Ammons, RN

## 2020-05-13 NOTE — Plan of Care (Signed)
Pt calm and cooperative. Delayed speech noted. Vitals stable. IV Abx infusing. Q2 turns performed. Safety measures in place. Will continue to monitor.  Problem: Education: Goal: Knowledge of General Education information will improve Description: Including pain rating scale, medication(s)/side effects and non-pharmacologic comfort measures Outcome: Progressing   Problem: Health Behavior/Discharge Planning: Goal: Ability to manage health-related needs will improve Outcome: Progressing   Problem: Clinical Measurements: Goal: Ability to maintain clinical measurements within normal limits will improve Outcome: Progressing   Problem: Clinical Measurements: Goal: Ability to maintain clinical measurements within normal limits will improve Outcome: Progressing Goal: Will remain free from infection Outcome: Progressing Goal: Diagnostic test results will improve Outcome: Progressing Goal: Respiratory complications will improve Outcome: Progressing Goal: Cardiovascular complication will be avoided Outcome: Progressing   Problem: Activity: Goal: Risk for activity intolerance will decrease Outcome: Progressing   Problem: Nutrition: Goal: Adequate nutrition will be maintained Outcome: Progressing   Problem: Coping: Goal: Level of anxiety will decrease Outcome: Progressing   Problem: Elimination: Goal: Will not experience complications related to bowel motility Outcome: Progressing Goal: Will not experience complications related to urinary retention Outcome: Progressing   Problem: Pain Managment: Goal: General experience of comfort will improve Outcome: Progressing   Problem: Safety: Goal: Ability to remain free from injury will improve Outcome: Progressing   Problem: Skin Integrity: Goal: Risk for impaired skin integrity will decrease Outcome: Progressing

## 2020-05-13 NOTE — Care Management Important Message (Signed)
Important Message  Patient Details  Name: ICKER SWIGERT MRN: [id number] Date of Birth: [birth year]   Medicare Important Message Given:  Yes  Spoke with legal guardian, Anselm Jungling [phone])) and reviewed the Important Message from Medicare with her.  She is in agreement with the discharge plan back to group home.   Juliann Pulse A Eri Platten 05/13/2020, 2:43 PM

## 2020-05-13 NOTE — Progress Notes (Signed)
Patient was a yellow MEWS from 1202 to 1924 today. Patient received a bolus and fluid and was not discharged back to group home because of his vital signs. Patient is stabilized after interventions. MD and charge were notified

## 2020-05-13 NOTE — Discharge Summary (Signed)
Physician Discharge Summary  Patient ID: [name] MRN: [id number] DOB/AGE: 07-04-1940 80 y.o.  Admit date: 05/07/2020 Discharge date: 05/13/2020  Admission Diagnoses:  Discharge Diagnoses:  Principal Problem:   HCAP (healthcare-associated pneumonia) Active Problems:   Cerebral palsy (HCC)   BPH (benign prostatic hyperplasia)   Hyperlipidemia   Depression with anxiety   Acute metabolic encephalopathy   Hematochezia   Acute blood loss anemia   Rectal bleeding   Discharged Condition: fair  Hospital Course:  Kevin A Andrewsis a 80 y.o.malewith medical history significant ofcerebral palsy,nonverbal at baseline, hyperlipidemia, wheelchair-bound, prostate cancer, BPH, lymphedema, PAD, depression, anxiety, who presents withSOB.He had a transient hypoxemia, later improved. Patient also had a significant leukocytosis, he was diagnosed with healthcare associated pneumonia, was treated with cefepime and vancomycin. Patient developed fresh red blood per rectum on 1/15, GI consult is obtained. Patient also received3unitsof PRBC.  #1.  Healthcare associated pneumonia. Most likely aspiration pneumonia Pseudomonas bacteremia. Acute metabolic encephalopathy. Cerebral palsy and quadriplegia. Patient had a blood culture performed 5 days ago, just come back with Pseudomonas in 1 of 2 bottles.  I  switched  to cefepime for coverage.  Final results Pseudomonas susceptible to Cipro.  We will continue 2 weeks of Cipro.  Redraw 2 sets of blood culture, negative in 24 hours.  2.  Acute blood loss anemia secondary to rectal bleeding. Fresh red blood per rectum. Bleeding scan and CT angiogram did not show any abnormality. EGD with without abnormality.  Sigmoid colonoscopy did not show any abnormality, full colonoscopy could not be performed due to poor prep. Currently, patient bleeding seems to be stopped. GI is not doing additional work-up, patient has been cleared for discharge.   I discussed with the patient family, there is a risk for rebleeding in the future.  He need to come back to the hospital if that happens.  3.  Hypokalemia. Hypophosphatemia Condition had improved.  4.  Prognosis. Patient has a poor prognosis due to medical problems.  He can be to refer to palliative care if needed.   Consults: GI  Significant Diagnostic Studies:  EGD no significant bleed.  CTA ABDOMEN AND PELVIS WITHOUT AND WITH CONTRAST  TECHNIQUE: Multidetector CT imaging of the abdomen and pelvis was performed using the standard protocol during bolus administration of intravenous contrast. Multiplanar reconstructed images and MIPs were obtained and reviewed to evaluate the vascular anatomy.  CONTRAST:  176mL OMNIPAQUE IOHEXOL 350 MG/ML SOLN  COMPARISON:  Nuclear medicine tagged red blood cell study-05/09/2020; CT abdomen pelvis-07/02/2017; abdominal MRI-08/06/2017  FINDINGS: VASCULAR  Aorta: Scattered minimal amount of eccentric calcified and noncalcified atherosclerotic plaque within normal caliber abdominal aorta, not resulting in a hemodynamically significant narrowing. No evidence of abdominal aortic dissection or periaortic stranding.  Celiac: Widely patent without a hemodynamically significant narrowing. An accessory left hepatic artery is noted to arise from the left gastric artery. Otherwise, conventional branching pattern.  SMA: Widely patent without hemodynamically significant narrowing. Conventional branching pattern. The distal tributaries of the SMA are widely patent without discrete lumen filling defect.  Renals: The right renal artery is duplicated with accessory, nearly could dominant right renal artery arising superior to the takeoff of the IMA and supplying the inferior pole the right kidney. Both dominant renal arteries are widely patent without hemodynamically significant narrowing. No vessel irregularity to suggest FMD.  IMA: Widely  patent without hemodynamically significant narrowing.  Inflow: There is a minimal amount of eccentric mixed calcified and noncalcified atherosclerotic plaque involving the bilateral normal caliber  common iliac arteries, not resulting in hemodynamically significant stenosis. The bilateral internal iliac arteries are disease though patent and of normal caliber. The bilateral external iliac arteries are tortuous though of normal caliber and widely patent without hemodynamically significant narrowing.  Proximal Outflow: The bilateral common and imaged portions of the bilateral deep and superficial femoral arteries are widely patent without hemodynamically significant narrowing.  Veins: The IVC and pelvic venous systems appear widely patent.  Review of the MIP images confirms the above findings.  _________________________________________________________  NON-VASCULAR  Lower chest: Limited visualization of the lower thorax demonstrates trace bilateral effusions with associated bibasilar opacities, likely atelectasis. Borderline cardiomegaly. No pericardial effusion.  Hepatobiliary: Normal hepatic contour. No discrete hepatic lesions. Gallbladder appears mildly distended though without radiopaque gallstones or biliary sludge. No definitive evidence of gallbladder wall thickening. No definite intra or extrahepatic biliary ductal dilatation. No ascites.  Pancreas: Normal appearance of the pancreas.  Spleen: Normal appearance of the spleen.  Adrenals/Urinary Tract: There is symmetric enhancement and excretion of the bilateral kidneys. Multiple stones are seen within the right renal collecting system, pelvis and superior aspect of the right ureter with dominant stone within the superior aspect of the right ureter measuring approximately 0.7 cm in maximal diameter (image 44, series 2), not resulting in urinary obstruction. Note is made of a solitary punctate (approximately 3  mm) nonobstructing stone within the interpolar aspect the left kidney (image 29, series 2).  Bilateral hypoattenuating nonenhancing renal cysts are seen bilaterally with dominant left-sided parapelvic cyst measuring 2.0 cm in diameter (image 28, series 6 and dominant partially exophytic cyst arising from the anterior inferior aspect the right kidney measuring 2.5 cm in diameter. No discrete worrisome renal lesions. No urinary obstruction or perinephric stranding. Normal appearance of the bilateral adrenal glands.  There is diffuse thickening the urinary bladder wall with mild irregular thickening and potential trabeculation.  Stomach/Bowel: Linear high density debris is seen within the cecum and ascending colon. Moderate colonic stool burden without evidence of enteric obstruction.  There are no discrete areas of pooling intraluminal contrast extravasation to suggest the etiology of reported history of lower GI bleeding.  There is diffuse wall thickening involving the cecum and proximal ascending colon as well as the distal sigmoid colon and rectum, not resulting in enteric obstruction. No significant hiatal hernia. No pneumoperitoneum, pneumatosis or portal venous gas.  Lymphatic: No bulky retroperitoneal, mesenteric, pelvic or inguinal lymphadenopathy.  Reproductive: Brachytherapy seeds are seen within the prostate bed. Small amount of fluid is seen with the pelvic cul-de-sac.  Other: Small right-sided direct inguinal hernia with small amount of fluid seen within the hernia as well as suspected small varicocele. Diffuse body wall anasarca.  Musculoskeletal: No definite acute or aggressive osseous abnormalities. Redemonstrated bilateral L5 pars defects with associated grade 1 anterolisthesis of L5 upon S1 measuring approximately 8 mm, similar to the 2019 examination. There is partial lumbarization of the S1 vertebral body. Stigmata of dish within the lower thoracic  and upper lumbar spine. Mild degenerative change the bilateral hips with joint space loss, subchondral sclerosis and osteophytosis. Deformity involving the posterior aspect of the right twelfth rib (image 24, series 2), likely the sequela of remote fracture.  IMPRESSION: 1. No discrete areas of intraluminal contrast extravasation to suggest the etiology of reported history of lower GI bleeding. 2. Minimal amount of atherosclerotic plaque with a normal caliber abdominal aorta, not resulting in a hemodynamically significant stenosis. 3.  Aortic Atherosclerosis (ICD10-I70.0).  NON-VASCULAR.  1. Diffuse wall thickening  involving the cecum and proximal ascending colon as well as the distal sigmoid colon and rectum, nonspecific though suggestive of an enteritis. No evidence of enteric obstruction. 2. Extensive nonobstructing right-sided nephrolithiasis with solitary punctate (3 mm) nonobstructing left-sided renal stone. 3. Small right-sided direct inguinal hernia containing a small amount of fluid as well as suspected small varicocele. 4. Brachytherapy seeds within the prostate bed. 5. Diffuse thickening of the urinary bladder wall with mild irregular thickening and potential trabeculation - clinical correlation is advised. Further evaluation with nonemergent urinalysis and/or cystoscopy could be performed as indicated. 6. Bilateral L5 pars defects with associated grade 1 anterolisthesis of L5 upon S1, similar to the 2019 examination.   Electronically Signed   By: Sandi Mariscal M.D.   On: 05/10/2020 15:12  NUCLEAR MEDICINE GASTROINTESTINAL BLEEDING SCAN  TECHNIQUE: Sequential abdominal images were obtained following intravenous administration of Tc-60m labeled red blood cells.  RADIOPHARMACEUTICALS:  22.0 mCi Tc-40m pertechnetate in-vitro labeled red cells.  COMPARISON:  CT Abdomen and Pelvis 07/02/2017.  FINDINGS: 2 hour exam was performed.  Normal blood pool  activity demonstrated in the 1st hour, along with accumulation of radiotracer in the penis.  Similar activity throughout the 2nd hour of the study, with addition of radiotracer in the urinary bladder.  There is no radiotracer accumulation specific for active gastrointestinal bleeding.  IMPRESSION: No active gastrointestinal bleeding identified on 2 hour tagged-red-cell scan.   Electronically Signed   By: Genevie Ann M.D.   On: 05/09/2020 05:00  Treatments: PRBC, antibiotics  Discharge Exam: Blood pressure 96/62, pulse (!) 57, temperature 98.1 F (36.7 C), resp. rate 18, weight 61.5 kg, SpO2 100 %. General appearance: alert and cooperative Resp: clear to auscultation bilaterally Cardio: regular rate and rhythm, S1, S2 normal, no murmur, click, rub or gallop GI: soft, non-tender; bowel sounds normal; no masses,  no organomegaly Extremities: extremities normal, atraumatic, no cyanosis or edema  Disposition: Discharge disposition: 01-Home or Self Care       Discharge Instructions    Diet - low sodium heart healthy   Complete by: As directed    Discharge wound care:   Complete by: As directed    RN follow with home care   Increase activity slowly   Complete by: As directed      Allergies as of 05/13/2020   No Known Allergies     Medication List    TAKE these medications   acetaminophen 325 MG tablet Commonly known as: TYLENOL TAKE 2 TABLETS (650 MG TOTAL) BY MOUTH EVERY 6 HOURS AS NEEDED FOR MILD PAIN OR HEADACHE.   alfuzosin 10 MG 24 hr tablet Commonly known as: UROXATRAL Take 1 tablet (10 mg total) by mouth daily.   ALPRAZolam 0.25 MG tablet Commonly known as: XANAX TAKE ONE TABLET BY MOUTH 2 TIMES A DAY AS NEEDED FOR ANXIETY (WHEN BLOOD PRESSURE IS VERY HIGH OR ANXIOUS) What changed:   how much to take  how to take this  when to take this  reasons to take this   Artificial Tears 0.2-0.2-1 % Soln Generic drug: Glycerin-Hypromellose-PEG  400 PLACE 2 DROPS INTO EYE 2 TIMES A DAY AS NEEDED   ascorbic acid 500 MG tablet Commonly known as: VITAMIN C TAKE ONE TABLET BY MOUTH ONCE DAILY   aspirin 81 MG EC tablet Commonly known as: Aspir-Low TAKE 1 TABLET BY MOUTH ONCE DAILY What changed:   how much to take  how to take this  when to take this   atorvastatin 40  MG tablet Commonly known as: LIPITOR Take 1 tablet (40 mg total) by mouth every evening.   augmented betamethasone dipropionate 0.05 % cream Commonly known as: DIPROLENE-AF APPLY TOPICALLY AS DIRECTED TO RASH AS NEEDED   Calcium-Vitamin D 500-400 MG-UNIT Tabs TAKE 1 TABLET BY MOUTH 2 TIMES A DAY   ciprofloxacin 500 MG tablet Commonly known as: Cipro Take 1 tablet (500 mg total) by mouth 2 (two) times daily for 12 days.   Cranberry Juice Powder 425 MG Caps TAKE 1 CAPSULE BY MOUTH ONCE DAILY   Depend Silhouette Briefs L/XL Misc 1 each by Does not apply route 2 (two) times daily as needed.   Ensure Nutrition Shake Liqd Take 1 each by mouth 2 (two) times daily.   lubiprostone 24 MCG capsule Commonly known as: AMITIZA TAKE 1 CAPSULE BY MOUTH 2 TIMES DAILY WITH A MEAL What changed:   how much to take  how to take this  when to take this   Medical Compression Stockings Misc 1 Units by Does not apply route daily.   polyethylene glycol 17 g packet Commonly known as: MIRALAX / GLYCOLAX Take 34 g by mouth 2 (two) times daily as needed.   sertraline 100 MG tablet Commonly known as: ZOLOFT Take 1 tablet (100 mg total) by mouth daily.   Systane Ultra 0.4-0.3 % Soln Generic drug: Polyethyl Glycol-Propyl Glycol Apply 1 drop to eye 2 (two) times daily. Both eyes            Discharge Care Instructions  (From admission, onward)         Start     Ordered   05/13/20 0000  Discharge wound care:       Comments: RN follow with home care   05/13/20 1321          Follow-up Information    Steele Sizer, MD Follow up in 1 week(s).    Specialty: Family Medicine Contact information: [address] [place] [address] [phone]        Lin Landsman, MD Follow up in 2 week(s).   Specialty: Gastroenterology Contact information: [place] [address] [phone]             35 minutes  Signed: Sharen Hones 05/13/2020, 1:21 PM

## 2020-05-13 NOTE — Anesthesia Postprocedure Evaluation (Signed)
Anesthesia Post Note  Patient: [name]  Procedure(s) Performed: ESOPHAGOGASTRODUODENOSCOPY (EGD) WITH PROPOFOL (N/A ) COLONOSCOPY WITH PROPOFOL (N/A )  Patient location during evaluation: PACU Anesthesia Type: General Level of consciousness: awake and alert Pain management: pain level controlled Vital Signs Assessment: post-procedure vital signs reviewed and stable Respiratory status: spontaneous breathing, nonlabored ventilation and respiratory function stable Cardiovascular status: blood pressure returned to baseline and stable Postop Assessment: no apparent nausea or vomiting Anesthetic complications: no   No complications documented.   Last Vitals:  Vitals:   05/13/20 0750 05/13/20 0813  BP: 139/87   Pulse: (!) 56   Resp: 15   Temp:  (!) 36.4 C  SpO2: 100%     Last Pain:  Vitals:   05/13/20 0813  TempSrc: Oral  PainSc:                  Tera Mater

## 2020-05-13 NOTE — NC FL2 (Signed)
[place] LEVEL OF CARE SCREENING TOOL     IDENTIFICATION  Patient Name: [name] Birthdate: July 16, 1940 Sex: male Admission Date (Current Location): 05/07/2020  [facility] and [place] Number:  Engineering geologist and Address:  [facility], [address] [place],  [address]      Provider Number: [id number]  Attending Physician Name and Address:  Sharen Hones, MD  Relative Name and Phone Number:       Current Level of Care: Hospital Recommended Level of Care: [place] Prior Approval Number:    Date Approved/Denied:   PASRR Number:    Discharge Plan: Other (Comment) (Family Care/Group home)    Current Diagnoses: Patient Active Problem List   Diagnosis Date Noted  . Acute blood loss anemia 05/09/2020  . Rectal bleeding 05/09/2020  . Hematochezia   . HCAP (healthcare-associated pneumonia) 05/07/2020  . BPH (benign prostatic hyperplasia)   . Hyperlipidemia   . Depression with anxiety   . Acute metabolic encephalopathy   . Protein-calorie malnutrition, severe 04/22/2020  . Pressure injury of skin 04/22/2020  . Multifocal pneumonia   . Sepsis due to undetermined organism ([place]) 04/19/2020  . Personal history of urinary calculi 06/21/2019  . PAD (peripheral artery disease) ([place]) 07/04/2017  . Aortic valve calcification 07/04/2017  . Coronary artery calcification 07/04/2017  . Late effect acute polio 05/17/2016  . Chronic constipation 11/19/2015  . Mild major depression ([place]) 11/19/2015  . Bacteriuria, chronic 09/11/2015  . History of prostate cancer 03/02/2015  . Amyotrophia 03/02/2015  . Edema leg 03/02/2015  . Cerebral palsy ([place]) 03/02/2015  . Dyslipidemia 03/02/2015  . Dermatitis, eczematoid 03/02/2015  . 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 H/O acute poliomyelitis 03/02/2015  . Lymphedema 03/02/2015  . Intellectual disability 03/02/2015  . Hypertrophy of nail 03/02/2015  . Chronic venous insufficiency  03/02/2015  . Avitaminosis D 03/02/2015  . Adynamia 03/02/2015  . Dependent on wheelchair 03/02/2015  . Renal cysts, acquired, bilateral 03/02/2015  . At risk for falling 03/02/2015  . Cerebral vascular disease 03/02/2015  . Strabismus 02/11/2015  . Urge incontinence of urine 02/11/2015  . Wheelchair dependence 02/11/2015  . Urinary frequency 10/30/2014    Orientation RESPIRATION BLADDER Height & Weight     Self,Time,Situation,Place  Normal Incontinent Weight: 61.5 kg Height:     BEHAVIORAL SYMPTOMS/MOOD NEUROLOGICAL BOWEL NUTRITION STATUS      Incontinent Diet (Regular)  AMBULATORY STATUS COMMUNICATION OF NEEDS Skin   Extensive Assist Verbally PU Stage and Appropriate Care                       Personal Care Assistance Level of Assistance  Bathing,Feeding,Dressing Bathing Assistance: Maximum assistance Feeding assistance: Limited assistance Dressing Assistance: Maximum assistance     Functional Limitations Info             SPECIAL CARE FACTORS FREQUENCY                       Contractures Contractures Info: Not present    Additional Factors Info  Code Status,Allergies Code Status Info: Full Allergies Info: No known allergies           Current Medications (05/13/2020):  This is the current hospital active medication list Current Facility-Administered Medications  Medication Dose Route Frequency Provider Last Rate Last Admin  . 0.9 %  sodium chloride infusion   Intravenous PRN Ivor Costa, MD 150 mL/hr at 05/10/20 1331 Restarted at 05/10/20 1345  .  0.9 %  sodium chloride infusion   Intravenous Continuous Lin Landsman, MD 20 mL/hr at 05/12/20 1133 New Bag at 05/12/20 1133  . acetaminophen (TYLENOL) suppository 650 mg  650 mg Rectal Q6H PRN Ivor Costa, MD      . acetaminophen (TYLENOL) tablet 650 mg  650 mg Oral Q6H PRN Ivor Costa, MD   650 mg at 05/12/20 2125  . alfuzosin (UROXATRAL) 24 hr tablet 10 mg  10 mg Oral Daily Ivor Costa, MD   10 mg  at 05/13/20 1017  . ALPRAZolam Duanne Moron) tablet 0.25 mg  0.25 mg Oral BID PRN Ivor Costa, MD   0.25 mg at 05/09/20 0505  . ascorbic acid (VITAMIN C) tablet 500 mg  500 mg Oral Daily Ivor Costa, MD   500 mg at 05/13/20 5102  . aspirin EC tablet 81 mg  81 mg Oral Daily Ivor Costa, MD   81 mg at 05/13/20 5852  . atorvastatin (LIPITOR) tablet 40 mg  40 mg Oral QPM Ivor Costa, MD   40 mg at 05/12/20 1802  . calcium-vitamin D (OSCAL WITH D) 500-200 MG-UNIT per tablet 1 tablet  1 tablet Oral BID Ivor Costa, MD   1 tablet at 05/13/20 [phone]  . ceFEPIme (MAXIPIME) 2 g in sodium chloride 0.9 % 100 mL IVPB  2 g Intravenous Q8H Zeigler, Sandi Mealy, RPH 200 mL/hr at 05/13/20 0500 2 g at 05/13/20 0500  . enoxaparin (LOVENOX) injection 40 mg  40 mg Subcutaneous Q24H Sharen Hones, MD   40 mg at 05/13/20 0834  . folic acid (FOLVITE) tablet 1 mg  1 mg Oral Daily Lin Landsman, MD   1 mg at 05/13/20 4235  . Gerhardt's butt cream   Topical QID Sharion Settler, NP   Given at 05/13/20 [phone]  . lubiprostone (AMITIZA) capsule 24 mcg  24 mcg Oral BID WC Ivor Costa, MD   24 mcg at 05/13/20 [phone]  . MEDLINE mouth rinse  15 mL Mouth Rinse BID Sharen Hones, MD   15 mL at 05/13/20 0835  . ondansetron (ZOFRAN) injection 4 mg  4 mg Intravenous Q8H PRN Ivor Costa, MD      . pantoprazole (PROTONIX) EC tablet 40 mg  40 mg Oral BID Virl Cagey E, RPH      . polyethylene glycol (MIRALAX / GLYCOLAX) packet 34 g  34 g Oral BID Lin Landsman, MD   34 g at 05/13/20 4008  . polyvinyl alcohol (LIQUIFILM TEARS) 1.4 % ophthalmic solution 2 drop  2 drop Both Eyes BID PRN Ivor Costa, MD      . protein supplement (ENSURE MAX) liquid  1 each Oral BID Ivor Costa, MD   330 mL at 05/13/20 0835  . sertraline (ZOLOFT) tablet 100 mg  100 mg Oral Daily Ivor Costa, MD   100 mg at 05/13/20 6761     Discharge Medications: Please see discharge summary for a list of discharge medications.  Relevant Imaging Results:  Relevant Lab  Results:   Additional Information [phone]  Shelbie Ammons, RN

## 2020-05-14 LAB — BASIC METABOLIC PANEL
Anion gap: 7 (ref 5–15)
BUN: 16 mg/dL (ref 8–23)
CO2: 25 mmol/L (ref 22–32)
Calcium: 8.4 mg/dL — ABNORMAL LOW (ref 8.9–10.3)
Chloride: 108 mmol/L (ref 98–111)
Creatinine, Ser: 0.46 mg/dL — ABNORMAL LOW (ref 0.61–1.24)
GFR, Estimated: >60 mL/min (ref >60)
Glucose, Bld: 81 mg/dL (ref 70–99)
Potassium: 4.2 mmol/L (ref 3.5–5.1)
Sodium: 140 mmol/L (ref 135–145)

## 2020-05-14 LAB — CBC WITH DIFFERENTIAL/PLATELET
Abs Immature Granulocytes: 0.03 10*3/uL (ref 0.00–0.07)
Basophils Absolute: 0 10*3/uL (ref 0.0–0.1)
Basophils Relative: 1 %
Eosinophils Absolute: 0.1 10*3/uL (ref 0.0–0.5)
Eosinophils Relative: 2 %
HCT: 27.5 % — ABNORMAL LOW (ref 39.0–52.0)
Hemoglobin: 9.1 g/dL — ABNORMAL LOW (ref 13.0–17.0)
Immature Granulocytes: 1 %
Lymphocytes Relative: 29 %
Lymphs Abs: 1.3 10*3/uL (ref 0.7–4.0)
MCH: 30.6 pg (ref 26.0–34.0)
MCHC: 33.1 g/dL (ref 30.0–36.0)
MCV: 92.6 fL (ref 80.0–100.0)
Monocytes Absolute: 0.3 10*3/uL (ref 0.1–1.0)
Monocytes Relative: 7 %
Neutro Abs: 2.7 10*3/uL (ref 1.7–7.7)
Neutrophils Relative %: 60 %
Platelets: 148 10*3/uL — ABNORMAL LOW (ref 150–400)
RBC: 2.97 MIL/uL — ABNORMAL LOW (ref 4.22–5.81)
RDW: 14.5 % (ref 11.5–15.5)
WBC: 4.5 10*3/uL (ref 4.0–10.5)
nRBC: 0 % (ref 0.0–0.2)

## 2020-05-14 LAB — MAGNESIUM: Magnesium: 1.9 mg/dL (ref 1.7–2.4)

## 2020-05-14 MED ORDER — CIPROFLOXACIN HCL 750 MG PO TABS: 750.0000 mg | ORAL_TABLET | Freq: Two times a day (BID) | ORAL | 0 refills | Status: DC

## 2020-05-14 NOTE — Progress Notes (Signed)
PROGRESS NOTE    [name]  [id number] DOB: 1941/01/23 DOA: 05/07/2020 PCP: Steele Sizer, MD    Brief Narrative: This 80 y.o.malewith PMH significant ofcerebral palsy,nonverbal at baseline, hyperlipidemia, wheelchair-bound, prostate cancer, BPH, lymphedema, PAD, depression, anxiety, who presents withSOB.He had a transient hypoxemia, later improved. Patient also had a significant leukocytosis, he was diagnosed with healthcare associated pneumonia, was treated with cefepime and vancomycin.  Blood cultures grew Pseudomonas.  Patient will continue 2 weeks of ciprofloxacin. Patient developed fresh red blood per rectum on 1/15, GI consult is obtained. Patient also received3unitsof PRBC.  EGD was without abnormality.  Sigmoidoscopy did not show any abnormality.  Full colonoscopy could not be performed due to poor preparation.  GI recommended no further GI work-up at this time.  Assessment & Plan:   Principal Problem:   HCAP (healthcare-associated pneumonia) Active Problems:   Cerebral palsy (HCC)   BPH (benign prostatic hyperplasia)   Hyperlipidemia   Depression with anxiety   Acute metabolic encephalopathy   Hematochezia   Acute blood loss anemia   Rectal bleeding   Healthcare associated pneumonia: Most likely aspiration pneumonia. Pseudomonas bacteremia. Acute metabolic encephalopathy. Cerebral palsy and quadriplegia. Patient had blood culture performed 5 days ago, came + with Pseudomonas in 1 of 2 bottles.  Antibiotics changed to cefepime for coverage.  Final results Pseudomonas susceptible to Cipro. We will continue 2 weeks of Cipro. Repeat 2 sets of blood culture, negative in 24 hours.  Acute blood loss anemia secondary to rectal bleeding. Fresh red blood per rectum. Bleeding scan and CT angiogram did not show any abnormality. EGD with without abnormality. Sigmoid colonoscopy did not show any abnormality, full colonoscopy could not be performed due to  poor prep. Bleeding has stopped. GI is not doing additional work-up, patient has been cleared for discharge.  I discussed with the patient family, there is a risk for rebleeding in the future.  He need to come back to the hospital if that happens.  Hypokalemia. Hypophosphatemia Condition had improved.  Guarded  Prognosis. Patient has a poor prognosis due to medical problems.  He can be to refer to palliative care if needed.    DVT prophylaxis: SCDs Code Status: Full code. Family Communication: No family at bed side.  Disposition Plan:   Status is: Inpatient  Remains inpatient appropriate because:Inpatient level of care appropriate due to severity of illness   Dispo: The patient is from: Home              Anticipated d/c is to: SNF              Anticipated d/c date is: 1 day              Patient currently is not medically stable to d/c.  Consultants:    GI   Procedures None  Antimicrobials:   Anti-infectives (From admission, onward)   Start     Dose/Rate Route Frequency Ordered Stop   05/14/20 0000  ciprofloxacin (CIPRO) 750 MG tablet        750 mg Oral 2 times daily 05/14/20 1141 05/22/20 2359   05/13/20 0000  ciprofloxacin (CIPRO) 500 MG tablet  Status:  Discontinued        500 mg Oral 2 times daily 05/13/20 1321 05/14/20    05/12/20 1800  ceFEPIme (MAXIPIME) 2 g in sodium chloride 0.9 % 100 mL IVPB        2 g 200 mL/hr over 30 Minutes Intravenous Every 8 hours 05/12/20 1555  05/11/20 2000  amoxicillin-clavulanate (AUGMENTIN) 875-125 MG per tablet 1 tablet  Status:  Discontinued        1 tablet Oral Every 12 hours 05/11/20 1413 05/12/20 0843   05/08/20 1315  Ampicillin-Sulbactam (UNASYN) 3 g in sodium chloride 0.9 % 100 mL IVPB  Status:  Discontinued        3 g 200 mL/hr over 30 Minutes Intravenous Every 6 hours 05/08/20 1222 05/11/20 1413   05/08/20 0100  vancomycin (VANCOREADY) IVPB 750 mg/150 mL  Status:  Discontinued        750 mg 150 mL/hr over 60  Minutes Intravenous Every 12 hours 05/07/20 1053 05/08/20 1222   05/07/20 1800  ceFEPIme (MAXIPIME) 2 g in sodium chloride 0.9 % 100 mL IVPB  Status:  Discontinued        2 g 200 mL/hr over 30 Minutes Intravenous Every 8 hours 05/07/20 1052 05/08/20 1222   05/07/20 1100  vancomycin (VANCOREADY) IVPB 1500 mg/300 mL        1,500 mg 150 mL/hr over 120 Minutes Intravenous  Once 05/07/20 1047 05/07/20 1503   05/07/20 0945  vancomycin (VANCOCIN) IVPB 1000 mg/200 mL premix  Status:  Discontinued        1,000 mg 200 mL/hr over 60 Minutes Intravenous  Once 05/07/20 0931 05/07/20 1046   05/07/20 0930  ceFEPIme (MAXIPIME) 2 g in sodium chloride 0.9 % 100 mL IVPB        2 g 200 mL/hr over 30 Minutes Intravenous  Once 05/07/20 0924 05/07/20 1101   05/07/20 0930  azithromycin (ZITHROMAX) 500 mg in sodium chloride 0.9 % 250 mL IVPB        500 mg 250 mL/hr over 60 Minutes Intravenous  Once 05/07/20 [id number] 05/07/20 1133     Subjective: Patient was seen and examined at bedside.  Patient is alert, arousable but difficult to assess his mental status.  His blood pressure is better today.  Objective: Vitals:   05/13/20 2350 05/14/20 0436 05/14/20 0852 05/14/20 1111  BP: 131/62 117/76 130/70 115/77  Pulse: (!) 55 (!) 54 61 (!) 57  Resp:   20 18  Temp: 97.6 F (36.4 C) 98.1 F (36.7 C) 97.8 F (36.6 C) 97.6 F (36.4 C)  TempSrc: Oral  Oral Oral  SpO2: 100% 100% 100% 98%  Weight:        Intake/Output Summary (Last 24 hours) at 05/14/2020 1434 Last data filed at 05/14/2020 [id number] Gross per 24 hour  Intake 1288.11 ml  Output 350 ml  Net 938.11 ml   Filed Weights   05/07/20 0737  Weight: 61.5 kg    Examination:  General exam: Appears calm and comfortable, not in any acute distress. Respiratory system: Clear to auscultation. Respiratory effort normal. Cardiovascular system: S1 & S2 heard, RRR. No JVD, murmurs, rubs, gallops or clicks. No pedal edema. Gastrointestinal system: Abdomen is  nondistended, soft and nontender. No organomegaly or masses felt. Normal bowel sounds heard. Central nervous system: Alert and oriented. No focal neurological deficits. Extremities: Symmetric 5 x 5 power. Skin: No rashes, lesions or ulcers Psychiatry: Not assessed    Data Reviewed: I have personally reviewed following labs and imaging studies  CBC: Recent Labs  Lab 05/10/20 0620 05/10/20 1151 05/11/20 0602 05/12/20 0559 05/13/20 0822 05/14/20 0323  WBC 4.7  --  4.2 4.2 4.4 4.5  NEUTROABS 2.7  --  2.3 2.2 2.2 2.7  HGB 8.9* 9.8* 9.0* 9.3* 11.1* 9.1*  HCT 26.7* 28.3* 26.7* 28.2* 33.8* 27.5*  MCV 90.5  --  89.9 92.2 93.9 92.6  PLT 120*  --  125* 138* 171 161*   Basic Metabolic Panel: Recent Labs  Lab 05/09/20 1035 05/10/20 0620 05/11/20 0602 05/12/20 0559 05/14/20 0323  NA 138 140 142 141 140  K 2.9* 3.3* 3.4* 3.8 4.2  CL 107 108 109 107 108  CO2 25 26 26 27 25   GLUCOSE 115* 74 74 71 81  BUN 22 17 11  7* 16  CREATININE 0.48* 0.43* 0.31* 0.42* 0.46*  CALCIUM 7.7* 7.8* 7.9* 8.1* 8.4*  MG 1.6* 1.7 2.0 2.3 1.9  PHOS  --  2.6  --   --   --    GFR: Estimated Creatinine Clearance: 67.3 mL/min (A) (by C-G formula based on SCr of 0.46 mg/dL (L)). Liver Function Tests: No results for input(s): AST, ALT, ALKPHOS, BILITOT, PROT, ALBUMIN in the last 168 hours. No results for input(s): LIPASE, AMYLASE in the last 168 hours. No results for input(s): AMMONIA in the last 168 hours. Coagulation Profile: No results for input(s): INR, PROTIME in the last 168 hours. Cardiac Enzymes: No results for input(s): CKTOTAL, CKMB, CKMBINDEX, TROPONINI in the last 168 hours. BNP (last 3 results) No results for input(s): PROBNP in the last 8760 hours. HbA1C: No results for input(s): HGBA1C in the last 72 hours. CBG: No results for input(s): GLUCAP in the last 168 hours. Lipid Profile: No results for input(s): CHOL, HDL, LDLCALC, TRIG, CHOLHDL, LDLDIRECT in the last 72 hours. Thyroid  Function Tests: No results for input(s): TSH, T4TOTAL, FREET4, T3FREE, THYROIDAB in the last 72 hours. Anemia Panel: No results for input(s): VITAMINB12, FOLATE, FERRITIN, TIBC, IRON, RETICCTPCT in the last 72 hours. Sepsis Labs: Recent Labs  Lab 05/08/20 0249 05/09/20 1035  PROCALCITON 0.70 0.36    Recent Results (from the past 240 hour(s))  Culture, blood (routine x 2)     Status: Abnormal   Collection Time: 05/07/20  7:40 AM   Specimen: BLOOD  Result Value Ref Range Status   Specimen Description   Final    BLOOD LEFT ANTECUBITAL Performed at [facility], [address] [place], [place] [address]    Special Requests   Final    BOTTLES DRAWN AEROBIC AND ANAEROBIC Blood Culture results may not be optimal due to an excessive volume of blood received in culture bottles Performed at [facility], [address], [place], [place] [address]    Culture  Setup Time   Final    GRAM NEGATIVE RODS IN BOTH AEROBIC AND ANAEROBIC BOTTLES CRITICAL RESULT CALLED TO, READ BACK BY AND VERIFIED WITH: Agron Dalton PHARMD @0821  05/12/20 EB Performed at [facility], [place] [address], [place], [place] [address]    Culture PSEUDOMONAS AERUGINOSA (A)  Final   Report Status 05/13/2020 FINAL  Final   Organism ID, Bacteria PSEUDOMONAS AERUGINOSA  Final      Susceptibility   Pseudomonas aeruginosa - MIC*    CEFTAZIDIME <=1 SENSITIVE Sensitive     CIPROFLOXACIN <=0.25 SENSITIVE Sensitive     GENTAMICIN <=1 SENSITIVE Sensitive     IMIPENEM <=0.25 SENSITIVE Sensitive     PIP/TAZO <=4 SENSITIVE Sensitive     CEFEPIME 2 SENSITIVE Sensitive     * PSEUDOMONAS AERUGINOSA  Culture, blood (routine x 2)     Status: None   Collection Time: 05/07/20  7:40 AM   Specimen: BLOOD  Result Value Ref Range Status   Specimen Description BLOOD RIGHT ANTECUBITAL  Final   Special  Requests   Final    BOTTLES DRAWN AEROBIC AND ANAEROBIC Blood Culture adequate volume   Culture   Final    NO  GROWTH 5 DAYS Performed at [facility], [address] [place], [place] [address]    Report Status 05/12/2020 FINAL  Final  Resp Panel by RT-PCR (Flu A&B, Covid) Nasopharyngeal Swab     Status: None   Collection Time: 05/07/20  8:26 AM   Specimen: Nasopharyngeal Swab; Nasopharyngeal(NP) swabs in vial transport medium  Result Value Ref Range Status   SARS Coronavirus 2 by RT PCR NEGATIVE NEGATIVE Final    Comment: (NOTE) SARS-CoV-2 target nucleic acids are NOT DETECTED.  The SARS-CoV-2 RNA is generally detectable in upper respiratory specimens during the acute phase of infection. The lowest concentration of SARS-CoV-2 viral copies this assay can detect is 138 copies/mL. A negative result does not preclude SARS-Cov-2 infection and should not be used as the sole basis for treatment or other patient management decisions. A negative result may occur with  improper specimen collection/handling, submission of specimen other than nasopharyngeal swab, presence of viral mutation(s) within the areas targeted by this assay, and inadequate number of viral copies(<138 copies/mL). A negative result must be combined with clinical observations, patient history, and epidemiological information. The expected result is Negative.  Fact Sheet for Patients:  EntrepreneurPulse.com.au  Fact Sheet for Healthcare Providers:  IncredibleEmployment.be  This test is no t yet approved or cleared by the [place] FDA and  has been authorized for detection and/or diagnosis of SARS-CoV-2 by FDA under an Emergency Use Authorization (EUA). This EUA will remain  in effect (meaning this test can be used) for the duration of the COVID-19 declaration under Section 564(b)(1) of the Act, 21 U.S.C.section 360bbb-3(b)(1), unless the authorization is terminated  or revoked sooner.       Influenza A by PCR NEGATIVE NEGATIVE Final   Influenza B by PCR NEGATIVE NEGATIVE  Final    Comment: (NOTE) The Xpert Xpress SARS-CoV-2/FLU/RSV plus assay is intended as an aid in the diagnosis of influenza from Nasopharyngeal swab specimens and should not be used as a sole basis for treatment. Nasal washings and aspirates are unacceptable for Xpert Xpress SARS-CoV-2/FLU/RSV testing.  Fact Sheet for Patients: EntrepreneurPulse.com.au  Fact Sheet for Healthcare Providers: IncredibleEmployment.be  This test is not yet approved or cleared by the [place] FDA and has been authorized for detection and/or diagnosis of SARS-CoV-2 by FDA under an Emergency Use Authorization (EUA). This EUA will remain in effect (meaning this test can be used) for the duration of the COVID-19 declaration under Section 564(b)(1) of the Act, 21 U.S.C. section 360bbb-3(b)(1), unless the authorization is terminated or revoked.  Performed at [facility], [address] [place], [place] [address]   MRSA PCR Screening     Status: Abnormal   Collection Time: 05/11/20  2:26 AM   Specimen: Nasopharyngeal  Result Value Ref Range Status   MRSA by PCR (A) NEGATIVE Final    INVALID, UNABLE TO DETERMINE THE PRESENCE OF TARGET DUE TO SPECIMEN INTEGRITY. RECOLLECTION REQUESTED.    Comment:        The GeneXpert MRSA Assay (FDA approved for NASAL specimens only), is one component of a comprehensive MRSA colonization surveillance program. It is not intended to diagnose MRSA infection nor to guide or monitor treatment for MRSA infections. REQUEST FOR RECOLLECT CALLED TO JACKIE ATWATER AT 0737 ON 05/11/2020 [place]. Performed at [facility], [place], [place]  [address]   MRSA PCR Screening     Status: None   Collection Time: 05/11/20  2:14 PM  Result Value Ref Range Status   MRSA by PCR NEGATIVE NEGATIVE Final    Comment:        The GeneXpert MRSA Assay (FDA approved for NASAL specimens only), is one component of  a comprehensive MRSA colonization surveillance program. It is not intended to diagnose MRSA infection nor to guide or monitor treatment for MRSA infections. Performed at [facility], [address] [place], [place] [address]   CULTURE, BLOOD (ROUTINE X 2) w Reflex to ID Panel     Status: None (Preliminary result)   Collection Time: 05/12/20  1:59 PM   Specimen: BLOOD  Result Value Ref Range Status   Specimen Description BLOOD BLOOD LEFT HAND  Final   Special Requests   Final    BOTTLES DRAWN AEROBIC AND ANAEROBIC Blood Culture results may not be optimal due to an inadequate volume of blood received in culture bottles   Culture   Final    NO GROWTH 2 DAYS Performed at [facility], [address] [place], [place] [address]    Report Status PENDING  Incomplete  CULTURE, BLOOD (ROUTINE X 2) w Reflex to ID Panel     Status: None (Preliminary result)   Collection Time: 05/12/20  2:11 PM   Specimen: BLOOD  Result Value Ref Range Status   Specimen Description BLOOD LEFT ANTECUBITAL  Final   Special Requests   Final    BOTTLES DRAWN AEROBIC AND ANAEROBIC Blood Culture results may not be optimal due to an inadequate volume of blood received in culture bottles   Culture   Final    NO GROWTH 2 DAYS Performed at [facility], [address], [place], [place] [address]    Report Status PENDING  Incomplete    Radiology Studies: No results found.  Scheduled Meds: . alfuzosin  10 mg Oral Daily  . ascorbic acid  500 mg Oral Daily  . aspirin EC  81 mg Oral Daily  . atorvastatin  40 mg Oral QPM  . calcium-vitamin D  1 tablet Oral BID  . enoxaparin (LOVENOX) injection  40 mg Subcutaneous Q24H  . folic acid  1 mg Oral Daily  . Gerhardt's butt cream   Topical QID  . lubiprostone  24 mcg Oral BID WC  . mouth rinse  15 mL Mouth Rinse BID  . pantoprazole  40 mg Oral BID  . polyethylene glycol  34 g Oral BID  . Ensure Max Protein  1 each Oral BID  .  sertraline  100 mg Oral Daily   Continuous Infusions: . sodium chloride 150 mL/hr at 05/10/20 1331  . sodium chloride 10 mL/hr at 05/13/20 2325  . ceFEPime (MAXIPIME) IV 2 g (05/14/20 0822)     LOS: 7 days    Time spent: 25 mins    Lakshmi Sundeen, MD [facility]   If 7PM-7AM, please contact night-coverage

## 2020-05-14 NOTE — TOC Progression Note (Signed)
Transition of Care [facility]) - Progression Note    Patient Details  Name: [name] MRN: [id number] Date of Birth: 10-Mar-1941  [facility] CM/SW Contact  Shelbie Ammons, RN Phone Number: 05/14/2020, 3:07 PM  Clinical Narrative:   RNCM received communication from [place] with [name]. She reports that patient can not be discharged over the weekend without first speaking to herself at [phone] or the administrator on call at [phone]. She also relays that there is the possibility they may not have transportation over the weekend.     Expected Discharge Plan: Group Home Barriers to Discharge: Continued Medical Work up  Expected Discharge Plan and Services Expected Discharge Plan: Group Home       Living arrangements for the past 2 months: Group Home Expected Discharge Date: 05/13/20                                     Social Determinants of Health (SDOH) Interventions    Readmission Risk Interventions No flowsheet data found.

## 2020-05-14 NOTE — NC FL2 (Signed)
[place] LEVEL OF CARE SCREENING TOOL     IDENTIFICATION  Patient Name: [name] Birthdate: 07-10-1940 Sex: male Admission Date (Current Location): 05/07/2020  [place] and [place] Number:  Engineering geologist and Address:  [facility], [address], [place], [place] [address]      Provider Number: [id number]  Attending Physician Name and Address:  Shawna Clamp, MD  Relative Name and Phone Number:       Current Level of Care: Hospital Recommended Level of Care: Other (Comment) ([place]) Prior Approval Number:    Date Approved/Denied:   PASRR Number:    Discharge Plan: Other (Comment) (Family Care/Group home)    Current Diagnoses: Patient Active Problem List   Diagnosis Date Noted  . Acute blood loss anemia 05/09/2020  . Rectal bleeding 05/09/2020  . Hematochezia   . HCAP (healthcare-associated pneumonia) 05/07/2020  . BPH (benign prostatic hyperplasia)   . Hyperlipidemia   . Depression with anxiety   . Acute metabolic encephalopathy   . Protein-calorie malnutrition, severe 04/22/2020  . Pressure injury of skin 04/22/2020  . Multifocal pneumonia   . Sepsis due to undetermined organism ([place]) 04/19/2020  . Personal history of urinary calculi 06/21/2019  . PAD (peripheral artery disease) ([place]) 07/04/2017  . Aortic valve calcification 07/04/2017  . Coronary artery calcification 07/04/2017  . Late effect acute polio 05/17/2016  . Chronic constipation 11/19/2015  . Mild major depression ([place]) 11/19/2015  . Bacteriuria, chronic 09/11/2015  . History of prostate cancer 03/02/2015  . Amyotrophia 03/02/2015  . Edema leg 03/02/2015  . Cerebral palsy ([place]) 03/02/2015  . Dyslipidemia 03/02/2015  . Dermatitis, eczematoid 03/02/2015  . 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 H/O acute poliomyelitis 03/02/2015  . Lymphedema 03/02/2015  . Intellectual disability 03/02/2015  . Hypertrophy of nail 03/02/2015  .  Chronic venous insufficiency 03/02/2015  . Avitaminosis D 03/02/2015  . Adynamia 03/02/2015  . Dependent on wheelchair 03/02/2015  . Renal cysts, acquired, bilateral 03/02/2015  . At risk for falling 03/02/2015  . Cerebral vascular disease 03/02/2015  . Strabismus 02/11/2015  . Urge incontinence of urine 02/11/2015  . Wheelchair dependence 02/11/2015  . Urinary frequency 10/30/2014    Orientation RESPIRATION BLADDER Height & Weight     Self,Time,Situation,Place  Normal Incontinent Weight: 61.5 kg Height:     BEHAVIORAL SYMPTOMS/MOOD NEUROLOGICAL BOWEL NUTRITION STATUS      Incontinent Diet (Regular)  AMBULATORY STATUS COMMUNICATION OF NEEDS Skin   Extensive Assist Verbally PU Stage and Appropriate Care                       Personal Care Assistance Level of Assistance  Bathing,Feeding,Dressing Bathing Assistance: Maximum assistance Feeding assistance: Limited assistance Dressing Assistance: Maximum assistance     Functional Limitations Info             SPECIAL CARE FACTORS FREQUENCY                       Contractures Contractures Info: Not present    Additional Factors Info  Code Status,Allergies Code Status Info: Full Allergies Info: No known allergies           Current Medications (05/14/2020):  This is the current hospital active medication list Current Facility-Administered Medications  Medication Dose Route Frequency Provider Last Rate Last Admin  . 0.9 %  sodium chloride infusion   Intravenous PRN Ivor Costa, MD 150 mL/hr at 05/10/20 1331 Restarted at  05/10/20 1345  . 0.9 %  sodium chloride infusion   Intravenous Continuous Lin Landsman, MD 10 mL/hr at 05/13/20 2325 New Bag at 05/13/20 2325  . acetaminophen (TYLENOL) suppository 650 mg  650 mg Rectal Q6H PRN Ivor Costa, MD      . acetaminophen (TYLENOL) tablet 650 mg  650 mg Oral Q6H PRN Ivor Costa, MD   650 mg at 05/12/20 2125  . alfuzosin (UROXATRAL) 24 hr tablet 10 mg  10 mg Oral  Daily Ivor Costa, MD   10 mg at 05/14/20 0857  . ALPRAZolam Duanne Moron) tablet 0.25 mg  0.25 mg Oral BID PRN Ivor Costa, MD   0.25 mg at 05/09/20 0505  . ascorbic acid (VITAMIN C) tablet 500 mg  500 mg Oral Daily Ivor Costa, MD   500 mg at 05/14/20 7353  . aspirin EC tablet 81 mg  81 mg Oral Daily Ivor Costa, MD   81 mg at 05/14/20 0825  . atorvastatin (LIPITOR) tablet 40 mg  40 mg Oral QPM Ivor Costa, MD   40 mg at 05/13/20 1739  . calcium-vitamin D (OSCAL WITH D) 500-200 MG-UNIT per tablet 1 tablet  1 tablet Oral BID Ivor Costa, MD   1 tablet at 05/14/20 0825  . ceFEPIme (MAXIPIME) 2 g in sodium chloride 0.9 % 100 mL IVPB  2 g Intravenous Q8H Berton Mount, RPH 200 mL/hr at 05/14/20 0822 2 g at 05/14/20 2992  . enoxaparin (LOVENOX) injection 40 mg  40 mg Subcutaneous Q24H Sharen Hones, MD   40 mg at 05/14/20 0824  . folic acid (FOLVITE) tablet 1 mg  1 mg Oral Daily Vanga, Tally Due, MD   1 mg at 05/14/20 0825  . Gerhardt's butt cream   Topical QID Sharion Settler, NP   Given at 05/14/20 [id number]  . lubiprostone (AMITIZA) capsule 24 mcg  24 mcg Oral BID WC Ivor Costa, MD   24 mcg at 05/14/20 0825  . MEDLINE mouth rinse  15 mL Mouth Rinse BID Sharen Hones, MD   15 mL at 05/14/20 0825  . ondansetron (ZOFRAN) injection 4 mg  4 mg Intravenous Q8H PRN Ivor Costa, MD      . pantoprazole (PROTONIX) EC tablet 40 mg  40 mg Oral BID Dorothe Pea, RPH   40 mg at 05/14/20 3419  . polyethylene glycol (MIRALAX / GLYCOLAX) packet 34 g  34 g Oral BID Lin Landsman, MD   34 g at 05/14/20 6222  . polyvinyl alcohol (LIQUIFILM TEARS) 1.4 % ophthalmic solution 2 drop  2 drop Both Eyes BID PRN Ivor Costa, MD      . protein supplement (ENSURE MAX) liquid  1 each Oral BID Ivor Costa, MD   330 mL at 05/14/20 0826  . sertraline (ZOLOFT) tablet 100 mg  100 mg Oral Daily Ivor Costa, MD   100 mg at 05/14/20 9798     Discharge Medications: Please see discharge summary for a list of discharge  medications.  Relevant Imaging Results:  Relevant Lab Results:   Additional Information [phone]  Shelbie Ammons, RN

## 2020-05-14 NOTE — Progress Notes (Signed)
Pharmacy Antibiotic Note  [name] is a 80 y.o. male admitted on 05/07/2020 with bacteremia.  Pharmacy has been consulted for cefepime dosing.  Patient with pseudomonas in blood cultures from 1/14 that became positive on 1/19.    Today, 05/14/2020  Day 3 cefepime  WBC WNL - improved with ampicillin/sulbactam and then amox/clav  Afebrile  1/14 Blood culture 1/2 sets (both bottles of 1 set) with Pseudomonas.  Interestingly, came positive on 5th day of incubation  Plan:  Continue Cefepime 2gm IV q8h for pseudomonas aeruginosa bacteremia  Recommend Cipro 750mg  BID at discharge   Weight: 61.5 kg (135 lb 9.3 oz)  Temp (24hrs), Avg:97.9 F (36.6 C), Min:97.6 F (36.4 C), Max:98.5 F (36.9 C)  Recent Labs  Lab 05/09/20 1035 05/10/20 0620 05/11/20 0602 05/12/20 0559 05/13/20 0822 05/14/20 0323  WBC 6.7 4.7 4.2 4.2 4.4 4.5  CREATININE 0.48* 0.43* 0.31* 0.42*  --  0.46*    Estimated Creatinine Clearance: 67.3 mL/min (A) (by C-G formula based on SCr of 0.46 mg/dL (L)).    No Known Allergies  Azithromycin 1/14 x 1 Cefepime/Vanc 1/14>1/15; 1/19 >> Unasyn/augmentin 1/15>>1/19  1/14 Bcx: pseudomonas 1/14 MRSA PCR: negative  Thank you for allowing pharmacy to be a part of this patient's care.  Pernell Dupre, PharmD, BCPS Clinical Pharmacist 05/14/2020 11:05 AM

## 2020-05-15 LAB — CBC
HCT: 27.6 % — ABNORMAL LOW (ref 39.0–52.0)
Hemoglobin: 9.2 g/dL — ABNORMAL LOW (ref 13.0–17.0)
MCH: 30.9 pg (ref 26.0–34.0)
MCHC: 33.3 g/dL (ref 30.0–36.0)
MCV: 92.6 fL (ref 80.0–100.0)
Platelets: 162 10*3/uL (ref 150–400)
RBC: 2.98 MIL/uL — ABNORMAL LOW (ref 4.22–5.81)
RDW: 14.6 % (ref 11.5–15.5)
WBC: 4.2 10*3/uL (ref 4.0–10.5)
nRBC: 0 % (ref 0.0–0.2)

## 2020-05-15 LAB — BASIC METABOLIC PANEL
Anion gap: 5 (ref 5–15)
BUN: 16 mg/dL (ref 8–23)
CO2: 28 mmol/L (ref 22–32)
Calcium: 8.4 mg/dL — ABNORMAL LOW (ref 8.9–10.3)
Chloride: 106 mmol/L (ref 98–111)
Creatinine, Ser: 0.57 mg/dL — ABNORMAL LOW (ref 0.61–1.24)
GFR, Estimated: >60 mL/min (ref >60)
Glucose, Bld: 97 mg/dL (ref 70–99)
Potassium: 4.1 mmol/L (ref 3.5–5.1)
Sodium: 139 mmol/L (ref 135–145)

## 2020-05-15 LAB — PHOSPHORUS: Phosphorus: 3 mg/dL (ref 2.5–4.6)

## 2020-05-15 LAB — MAGNESIUM: Magnesium: 1.9 mg/dL (ref 1.7–2.4)

## 2020-05-15 NOTE — Progress Notes (Signed)
PROGRESS NOTE    FAUSTO SAMPEDRO  QIO:[id number] DOB: 04-01-1941 DOA: 05/07/2020 PCP: Steele Sizer, MD    Brief Narrative: This 80 y.o.malewith PMH significant ofcerebral palsy,nonverbal at baseline, hyperlipidemia, wheelchair-bound, prostate cancer, BPH, lymphedema, PAD, depression, anxiety, who presents withSOB.He had a transient hypoxemia, later improved. Patient also had a significant leukocytosis, he was diagnosed with healthcare associated pneumonia, was treated with cefepime and vancomycin.  Blood cultures grew Pseudomonas.  Patient will continue 2 weeks of ciprofloxacin. Patient developed fresh red blood per rectum on 1/15, GI consult is obtained. Patient also received3unitsof PRBC.  EGD was without abnormality. Sigmoidoscopy did not show any abnormality.  Full colonoscopy could not be performed due to poor preparation.  GI recommended no further GI work-up at this time.  Assessment & Plan:   Principal Problem:   HCAP (healthcare-associated pneumonia) Active Problems:   Cerebral palsy (HCC)   BPH (benign prostatic hyperplasia)   Hyperlipidemia   Depression with anxiety   Acute metabolic encephalopathy   Hematochezia   Acute blood loss anemia   Rectal bleeding   Healthcare associated pneumonia: Most likely aspiration pneumonia. Pseudomonas bacteremia. Acute metabolic encephalopathy. Cerebral palsy and quadriplegia. Patient had blood culture performed 5 days ago, came + with Pseudomonas in 1 of 2 bottles.  Antibiotics changed to cefepime for coverage.  Final results Pseudomonas susceptible to Cipro. We will continue 2 weeks of Cipro. Repeat 2 sets of blood culture, negative in 24 hours.  Acute blood loss anemia secondary to rectal bleeding. Fresh red blood per rectum. Bleeding scan and CT angiogram did not show any abnormality. EGD with without abnormality. Sigmoid colonoscopy did not show any abnormality, full colonoscopy could not be performed due to  poor prep. Bleeding has stopped. Hb remains stable. GI is not doing additional work-up, patient has been cleared for discharge.  We discussed with the patient family, there is a risk for rebleeding in the future.   He need to come back to the hospital if that happens.  Hypokalemia >> Resolved.  Hypophosphatemia >>> Resolved. Condition had improved.  Guarded  Prognosis. Patient has a poor prognosis due to medical problems.  He can be to refer to palliative care if needed.   DVT prophylaxis: SCDs Code Status: Full code. Family Communication: No family at bed side.  Disposition Plan:   Status is: Inpatient  Remains inpatient appropriate because:Inpatient level of care appropriate due to severity of illness   Dispo: The patient is from: Home              Anticipated d/c is to: SNF              Anticipated d/c date is: 1 day              Patient currently is not medically stable to d/c.  Consultants:    GI   Procedures None  Antimicrobials:   Anti-infectives (From admission, onward)   Start     Dose/Rate Route Frequency Ordered Stop   05/14/20 0000  ciprofloxacin (CIPRO) 750 MG tablet        750 mg Oral 2 times daily 05/14/20 1141 05/22/20 2359   05/13/20 0000  ciprofloxacin (CIPRO) 500 MG tablet  Status:  Discontinued        500 mg Oral 2 times daily 05/13/20 1321 05/14/20    05/12/20 1800  ceFEPIme (MAXIPIME) 2 g in sodium chloride 0.9 % 100 mL IVPB        2 g 200 mL/hr over 30 Minutes  Intravenous Every 8 hours 05/12/20 1555     05/11/20 2000  amoxicillin-clavulanate (AUGMENTIN) 875-125 MG per tablet 1 tablet  Status:  Discontinued        1 tablet Oral Every 12 hours 05/11/20 1413 05/12/20 0843   05/08/20 1315  Ampicillin-Sulbactam (UNASYN) 3 g in sodium chloride 0.9 % 100 mL IVPB  Status:  Discontinued        3 g 200 mL/hr over 30 Minutes Intravenous Every 6 hours 05/08/20 1222 05/11/20 1413   05/08/20 0100  vancomycin (VANCOREADY) IVPB 750 mg/150 mL  Status:   Discontinued        750 mg 150 mL/hr over 60 Minutes Intravenous Every 12 hours 05/07/20 1053 05/08/20 1222   05/07/20 1800  ceFEPIme (MAXIPIME) 2 g in sodium chloride 0.9 % 100 mL IVPB  Status:  Discontinued        2 g 200 mL/hr over 30 Minutes Intravenous Every 8 hours 05/07/20 1052 05/08/20 1222   05/07/20 1100  vancomycin (VANCOREADY) IVPB 1500 mg/300 mL        1,500 mg 150 mL/hr over 120 Minutes Intravenous  Once 05/07/20 1047 05/07/20 1503   05/07/20 0945  vancomycin (VANCOCIN) IVPB 1000 mg/200 mL premix  Status:  Discontinued        1,000 mg 200 mL/hr over 60 Minutes Intravenous  Once 05/07/20 0931 05/07/20 1046   05/07/20 0930  ceFEPIme (MAXIPIME) 2 g in sodium chloride 0.9 % 100 mL IVPB        2 g 200 mL/hr over 30 Minutes Intravenous  Once 05/07/20 0924 05/07/20 1101   05/07/20 0930  azithromycin (ZITHROMAX) 500 mg in sodium chloride 0.9 % 250 mL IVPB        500 mg 250 mL/hr over 60 Minutes Intravenous  Once 05/07/20 6269 05/07/20 1133     Subjective: Patient was seen and examined at bedside.  Patient is alert, arousable but difficult to assess his mental status.   His blood pressure is better controlled. Overnight events noted.  Objective: Vitals:   05/14/20 2338 05/15/20 0342 05/15/20 0745 05/15/20 0759  BP: 116/68 105/67 118/75 101/71  Pulse: 61 (!) 55 (!) 58 67  Resp:      Temp: (!) 97.3 F (36.3 C) (!) 97.5 F (36.4 C) 97.9 F (36.6 C) 98.3 F (36.8 C)  TempSrc: Oral Oral  Oral  SpO2: 100% 97% 100% 100%  Weight:        Intake/Output Summary (Last 24 hours) at 05/15/2020 1411 Last data filed at 05/15/2020 1026 Gross per 24 hour  Intake 60 ml  Output 3250 ml  Net -3190 ml   Filed Weights   05/07/20 0737  Weight: 61.5 kg    Examination:  General exam: Appears calm and comfortable, not in any acute distress. Respiratory system: Clear to auscultation. Respiratory effort normal. Cardiovascular system: S1 & S2 heard, RRR. No JVD, murmurs, rubs, gallops or  clicks. No pedal edema. Gastrointestinal system: Abdomen is nondistended, soft and nontender. No organomegaly or masses felt. Normal bowel sounds heard. Central nervous system: Alert and oriented. No focal neurological deficits. Extremities: Symmetric 5 x 5 power. Skin: No rashes, lesions or ulcers Psychiatry: Not assessed    Data Reviewed: I have personally reviewed following labs and imaging studies  CBC: Recent Labs  Lab 05/10/20 0620 05/10/20 1151 05/11/20 0602 05/12/20 0559 05/13/20 0822 05/14/20 0323 05/15/20 0619  WBC 4.7  --  4.2 4.2 4.4 4.5 4.2  NEUTROABS 2.7  --  2.3 2.2 2.2  2.7  --   HGB 8.9*   < > 9.0* 9.3* 11.1* 9.1* 9.2*  HCT 26.7*   < > 26.7* 28.2* 33.8* 27.5* 27.6*  MCV 90.5  --  89.9 92.2 93.9 92.6 92.6  PLT 120*  --  125* 138* 171 148* 162   < > = values in this interval not displayed.   Basic Metabolic Panel: Recent Labs  Lab 05/10/20 0620 05/11/20 0602 05/12/20 0559 05/14/20 0323 05/15/20 0619  NA 140 142 141 140 139  K 3.3* 3.4* 3.8 4.2 4.1  CL 108 109 107 108 106  CO2 26 26 27 25 28   GLUCOSE 74 74 71 81 97  BUN 17 11 7* 16 16  CREATININE 0.43* 0.31* 0.42* 0.46* 0.57*  CALCIUM 7.8* 7.9* 8.1* 8.4* 8.4*  MG 1.7 2.0 2.3 1.9 1.9  PHOS 2.6  --   --   --  3.0   GFR: Estimated Creatinine Clearance: 67.3 mL/min (A) (by C-G formula based on SCr of 0.57 mg/dL (L)). Liver Function Tests: No results for input(s): AST, ALT, ALKPHOS, BILITOT, PROT, ALBUMIN in the last 168 hours. No results for input(s): LIPASE, AMYLASE in the last 168 hours. No results for input(s): AMMONIA in the last 168 hours. Coagulation Profile: No results for input(s): INR, PROTIME in the last 168 hours. Cardiac Enzymes: No results for input(s): CKTOTAL, CKMB, CKMBINDEX, TROPONINI in the last 168 hours. BNP (last 3 results) No results for input(s): PROBNP in the last 8760 hours. HbA1C: No results for input(s): HGBA1C in the last 72 hours. CBG: No results for input(s): GLUCAP  in the last 168 hours. Lipid Profile: No results for input(s): CHOL, HDL, LDLCALC, TRIG, CHOLHDL, LDLDIRECT in the last 72 hours. Thyroid Function Tests: No results for input(s): TSH, T4TOTAL, FREET4, T3FREE, THYROIDAB in the last 72 hours. Anemia Panel: No results for input(s): VITAMINB12, FOLATE, FERRITIN, TIBC, IRON, RETICCTPCT in the last 72 hours. Sepsis Labs: Recent Labs  Lab 05/09/20 1035  PROCALCITON 0.36    Recent Results (from the past 240 hour(s))  Culture, blood (routine x 2)     Status: Abnormal   Collection Time: 05/07/20  7:40 AM   Specimen: BLOOD  Result Value Ref Range Status   Specimen Description   Final    BLOOD LEFT ANTECUBITAL Performed at [facility], [address], [place], [place] [address]    Special Requests   Final    BOTTLES DRAWN AEROBIC AND ANAEROBIC Blood Culture results may not be optimal due to an excessive volume of blood received in culture bottles Performed at [facility], [address] [place], [place] [address]    Culture  Setup Time   Final    GRAM NEGATIVE RODS IN BOTH AEROBIC AND ANAEROBIC BOTTLES CRITICAL RESULT CALLED TO, READ BACK BY AND VERIFIED WITH: Otha Dalton PHARMD @0821  05/12/20 EB Performed at [facility], [place] [address], [place], [place] [address]    Culture PSEUDOMONAS AERUGINOSA (A)  Final   Report Status 05/13/2020 FINAL  Final   Organism ID, Bacteria PSEUDOMONAS AERUGINOSA  Final      Susceptibility   Pseudomonas aeruginosa - MIC*    CEFTAZIDIME <=1 SENSITIVE Sensitive     CIPROFLOXACIN <=0.25 SENSITIVE Sensitive     GENTAMICIN <=1 SENSITIVE Sensitive     IMIPENEM <=0.25 SENSITIVE Sensitive     PIP/TAZO <=4 SENSITIVE Sensitive     CEFEPIME 2 SENSITIVE Sensitive     * PSEUDOMONAS AERUGINOSA  Culture, blood (routine x  2)     Status: None   Collection Time: 05/07/20  7:40 AM   Specimen: BLOOD  Result Value Ref Range Status   Specimen Description BLOOD RIGHT ANTECUBITAL  Final    Special Requests   Final    BOTTLES DRAWN AEROBIC AND ANAEROBIC Blood Culture adequate volume   Culture   Final    NO GROWTH 5 DAYS Performed at [facility], [address] [place], [place] [address]    Report Status 05/12/2020 FINAL  Final  Resp Panel by RT-PCR (Flu A&B, Covid) Nasopharyngeal Swab     Status: None   Collection Time: 05/07/20  8:26 AM   Specimen: Nasopharyngeal Swab; Nasopharyngeal(NP) swabs in vial transport medium  Result Value Ref Range Status   SARS Coronavirus 2 by RT PCR NEGATIVE NEGATIVE Final    Comment: (NOTE) SARS-CoV-2 target nucleic acids are NOT DETECTED.  The SARS-CoV-2 RNA is generally detectable in upper respiratory specimens during the acute phase of infection. The lowest concentration of SARS-CoV-2 viral copies this assay can detect is 138 copies/mL. A negative result does not preclude SARS-Cov-2 infection and should not be used as the sole basis for treatment or other patient management decisions. A negative result may occur with  improper specimen collection/handling, submission of specimen other than nasopharyngeal swab, presence of viral mutation(s) within the areas targeted by this assay, and inadequate number of viral copies(<138 copies/mL). A negative result must be combined with clinical observations, patient history, and epidemiological information. The expected result is Negative.  Fact Sheet for Patients:  EntrepreneurPulse.com.au  Fact Sheet for Healthcare Providers:  IncredibleEmployment.be  This test is no t yet approved or cleared by the [place] FDA and  has been authorized for detection and/or diagnosis of SARS-CoV-2 by FDA under an Emergency Use Authorization (EUA). This EUA will remain  in effect (meaning this test can be used) for the duration of the COVID-19 declaration under Section 564(b)(1) of the Act, 21 U.S.C.section 360bbb-3(b)(1), unless the authorization is  terminated  or revoked sooner.       Influenza A by PCR NEGATIVE NEGATIVE Final   Influenza B by PCR NEGATIVE NEGATIVE Final    Comment: (NOTE) The Xpert Xpress SARS-CoV-2/FLU/RSV plus assay is intended as an aid in the diagnosis of influenza from Nasopharyngeal swab specimens and should not be used as a sole basis for treatment. Nasal washings and aspirates are unacceptable for Xpert Xpress SARS-CoV-2/FLU/RSV testing.  Fact Sheet for Patients: EntrepreneurPulse.com.au  Fact Sheet for Healthcare Providers: IncredibleEmployment.be  This test is not yet approved or cleared by the [place] FDA and has been authorized for detection and/or diagnosis of SARS-CoV-2 by FDA under an Emergency Use Authorization (EUA). This EUA will remain in effect (meaning this test can be used) for the duration of the COVID-19 declaration under Section 564(b)(1) of the Act, 21 U.S.C. section 360bbb-3(b)(1), unless the authorization is terminated or revoked.  Performed at [facility], [address] [place], [place] [address]   MRSA PCR Screening     Status: Abnormal   Collection Time: 05/11/20  2:26 AM   Specimen: Nasopharyngeal  Result Value Ref Range Status   MRSA by PCR (A) NEGATIVE Final    INVALID, UNABLE TO DETERMINE THE PRESENCE OF TARGET DUE TO SPECIMEN INTEGRITY. RECOLLECTION REQUESTED.    Comment:        The GeneXpert MRSA Assay (FDA approved for NASAL specimens only), is one component of a comprehensive MRSA colonization surveillance program. It is not  intended to diagnose MRSA infection nor to guide or monitor treatment for MRSA infections. REQUEST FOR RECOLLECT CALLED TO JACKIE ATWATER AT [id number] ON 05/11/2020 [place]. Performed at [facility], [address] [place], [place] [address]   MRSA PCR Screening     Status: None   Collection Time: 05/11/20  2:14 PM  Result Value Ref Range Status   MRSA by PCR NEGATIVE NEGATIVE  Final    Comment:        The GeneXpert MRSA Assay (FDA approved for NASAL specimens only), is one component of a comprehensive MRSA colonization surveillance program. It is not intended to diagnose MRSA infection nor to guide or monitor treatment for MRSA infections. Performed at [facility], [address] [place], [place] [address]   CULTURE, BLOOD (ROUTINE X 2) w Reflex to ID Panel     Status: None (Preliminary result)   Collection Time: 05/12/20  1:59 PM   Specimen: BLOOD  Result Value Ref Range Status   Specimen Description BLOOD BLOOD LEFT HAND  Final   Special Requests   Final    BOTTLES DRAWN AEROBIC AND ANAEROBIC Blood Culture results may not be optimal due to an inadequate volume of blood received in culture bottles   Culture   Final    NO GROWTH 3 DAYS Performed at [facility], [address] [place], [place] [address]    Report Status PENDING  Incomplete  CULTURE, BLOOD (ROUTINE X 2) w Reflex to ID Panel     Status: None (Preliminary result)   Collection Time: 05/12/20  2:11 PM   Specimen: BLOOD  Result Value Ref Range Status   Specimen Description BLOOD LEFT ANTECUBITAL  Final   Special Requests   Final    BOTTLES DRAWN AEROBIC AND ANAEROBIC Blood Culture results may not be optimal due to an inadequate volume of blood received in culture bottles   Culture   Final    NO GROWTH 3 DAYS Performed at [facility], [address], [place],  [address]    Report Status PENDING  Incomplete    Radiology Studies: No results found.  Scheduled Meds: . alfuzosin  10 mg Oral Daily  . ascorbic acid  500 mg Oral Daily  . aspirin EC  81 mg Oral Daily  . atorvastatin  40 mg Oral QPM  . calcium-vitamin D  1 tablet Oral BID  . enoxaparin (LOVENOX) injection  40 mg Subcutaneous Q24H  . folic acid  1 mg Oral Daily  . Gerhardt's butt cream   Topical QID  . lubiprostone  24 mcg Oral BID WC  . mouth rinse  15 mL Mouth Rinse BID  .  pantoprazole  40 mg Oral BID  . polyethylene glycol  34 g Oral BID  . Ensure Max Protein  1 each Oral BID  . sertraline  100 mg Oral Daily   Continuous Infusions: . sodium chloride 150 mL/hr at 05/10/20 1331  . sodium chloride 10 mL/hr at 05/13/20 2325  . ceFEPime (MAXIPIME) IV 2 g (05/15/20 1346)     LOS: 8 days    Time spent: 25 mins    Cuyler Vandyken, MD [facility]   If 7PM-7AM, please contact night-coverage

## 2020-05-16 LAB — BASIC METABOLIC PANEL
Anion gap: 5 (ref 5–15)
BUN: 24 mg/dL — ABNORMAL HIGH (ref 8–23)
CO2: 29 mmol/L (ref 22–32)
Calcium: 8.5 mg/dL — ABNORMAL LOW (ref 8.9–10.3)
Chloride: 106 mmol/L (ref 98–111)
Creatinine, Ser: 0.52 mg/dL — ABNORMAL LOW (ref 0.61–1.24)
GFR, Estimated: >60 mL/min (ref >60)
Glucose, Bld: 81 mg/dL (ref 70–99)
Potassium: 3.7 mmol/L (ref 3.5–5.1)
Sodium: 140 mmol/L (ref 135–145)

## 2020-05-16 LAB — HEMOGLOBIN AND HEMATOCRIT, BLOOD
HCT: 27.3 % — ABNORMAL LOW (ref 39.0–52.0)
Hemoglobin: 9.1 g/dL — ABNORMAL LOW (ref 13.0–17.0)

## 2020-05-16 LAB — PHOSPHORUS: Phosphorus: 2.5 mg/dL (ref 2.5–4.6)

## 2020-05-16 LAB — MAGNESIUM: Magnesium: 1.8 mg/dL (ref 1.7–2.4)

## 2020-05-16 NOTE — Progress Notes (Signed)
PROGRESS NOTE    GRAVIEL Macias  [id number] DOB: 04/06/1941 DOA: 05/07/2020 PCP: Steele Sizer, MD    Brief Narrative: This 79 y.o.malewith PMH significant ofcerebral palsy,nonverbal at baseline, hyperlipidemia, wheelchair-bound, prostate cancer, BPH, lymphedema, PAD, depression, anxiety, who presents withSOB.He had a transient hypoxemia, later improved. Patient also had a significant leukocytosis, he was diagnosed with healthcare associated pneumonia, was treated with cefepime and vancomycin.  Blood cultures grew Pseudomonas.  Patient will continue 2 weeks of ciprofloxacin. Patient developed fresh red blood per rectum on 1/15, GI consult is obtained. Patient also received3unitsof PRBC.  EGD was without abnormality. Sigmoidoscopy did not show any abnormality.  Full colonoscopy could not be performed due to poor preparation.  GI recommended no further GI work-up at this time.  Assessment & Plan:   Principal Problem:   HCAP (healthcare-associated pneumonia) Active Problems:   Cerebral palsy (HCC)   BPH (benign prostatic hyperplasia)   Hyperlipidemia   Depression with anxiety   Acute metabolic encephalopathy   Hematochezia   Acute blood loss anemia   Rectal bleeding   Healthcare associated pneumonia: Most likely aspiration pneumonia. Pseudomonas bacteremia. Acute metabolic encephalopathy. Cerebral palsy and quadriplegia. Patient had blood culture performed 5 days ago, came + with Pseudomonas in 1 of 2 bottles.  Antibiotics changed to cefepime for coverage.  Final results Pseudomonas susceptible to Cipro.  continue 2 weeks of Cipro. Repeat 2 sets of blood culture, negative in 24 hours. We will clarify with infectious disease about the duration of antibiotics tomorrow.  Acute blood loss anemia secondary to rectal bleeding. Fresh red blood per rectum. Bleeding scan and CT angiogram did not show any abnormality. EGD without abnormality. Sigmoid colonoscopy did  not show any abnormality, full colonoscopy could not be performed due to poor prep. Bleeding has stopped. Hb remains stable above 9.0 GI is not doing additional work-up, patient has been cleared for discharge.  We discussed with the patient family, there is a risk for rebleeding in the future.   He need to come back to the hospital if that happens.  Hypokalemia >> Resolved.   Hypophosphatemia >>> Resolved. Condition had improved.  Guarded  Prognosis. Patient has a poor prognosis due to medical problems.  He can be refer to palliative care if needed.   DVT prophylaxis: SCDs Code Status: Full code. Family Communication: No family at bed side.  Disposition Plan:   Status is: Inpatient  Remains inpatient appropriate because:Inpatient level of care appropriate due to severity of illness   Dispo: The patient is from: Home              Anticipated d/c is to: SNF              Anticipated d/c date is: 1- 2 days.              Patient currently is not medically stable to d/c.  Consultants:    GI   Procedures : EGD and sigmoidoscopy.  Antimicrobials:   Anti-infectives (From admission, onward)   Start     Dose/Rate Route Frequency Ordered Stop   05/14/20 0000  ciprofloxacin (CIPRO) 750 MG tablet        750 mg Oral 2 times daily 05/14/20 1141 05/22/20 2359   05/13/20 0000  ciprofloxacin (CIPRO) 500 MG tablet  Status:  Discontinued        500 mg Oral 2 times daily 05/13/20 1321 05/14/20    05/12/20 1800  ceFEPIme (MAXIPIME) 2 g in sodium chloride 0.9 % 100  mL IVPB        2 g 200 mL/hr over 30 Minutes Intravenous Every 8 hours 05/12/20 1555     05/11/20 2000  amoxicillin-clavulanate (AUGMENTIN) 875-125 MG per tablet 1 tablet  Status:  Discontinued        1 tablet Oral Every 12 hours 05/11/20 1413 05/12/20 0843   05/08/20 1315  Ampicillin-Sulbactam (UNASYN) 3 g in sodium chloride 0.9 % 100 mL IVPB  Status:  Discontinued        3 g 200 mL/hr over 30 Minutes Intravenous Every 6  hours 05/08/20 1222 05/11/20 1413   05/08/20 0100  vancomycin (VANCOREADY) IVPB 750 mg/150 mL  Status:  Discontinued        750 mg 150 mL/hr over 60 Minutes Intravenous Every 12 hours 05/07/20 1053 05/08/20 1222   05/07/20 1800  ceFEPIme (MAXIPIME) 2 g in sodium chloride 0.9 % 100 mL IVPB  Status:  Discontinued        2 g 200 mL/hr over 30 Minutes Intravenous Every 8 hours 05/07/20 1052 05/08/20 1222   05/07/20 1100  vancomycin (VANCOREADY) IVPB 1500 mg/300 mL        1,500 mg 150 mL/hr over 120 Minutes Intravenous  Once 05/07/20 1047 05/07/20 1503   05/07/20 0945  vancomycin (VANCOCIN) IVPB 1000 mg/200 mL premix  Status:  Discontinued        1,000 mg 200 mL/hr over 60 Minutes Intravenous  Once 05/07/20 0931 05/07/20 1046   05/07/20 0930  ceFEPIme (MAXIPIME) 2 g in sodium chloride 0.9 % 100 mL IVPB        2 g 200 mL/hr over 30 Minutes Intravenous  Once 05/07/20 0924 05/07/20 1101   05/07/20 0930  azithromycin (ZITHROMAX) 500 mg in sodium chloride 0.9 % 250 mL IVPB        500 mg 250 mL/hr over 60 Minutes Intravenous  Once 05/07/20 0981 05/07/20 1133     Subjective: Patient was seen and examined at bedside.  Patient is alert, arousable but difficult to assess his mental status.   His blood pressure is better controlled. Overnight events noted.  Patient was having breakfast when seen in the morning.  Objective: Vitals:   05/15/20 1926 05/15/20 2300 05/16/20 0405 05/16/20 0852  BP: 123/85 102/64 107/61 95/65  Pulse: 65 (!) 55 (!) 52 63  Resp: 19 18 18 18   Temp: (!) 97.4 F (36.3 C) 97.6 F (36.4 C) 97.9 F (36.6 C) 98.1 F (36.7 C)  TempSrc: Oral Oral Oral   SpO2: 96% 97% 99% 98%  Weight:        Intake/Output Summary (Last 24 hours) at 05/16/2020 1109 Last data filed at 05/16/2020 1011 Gross per 24 hour  Intake 670 ml  Output 650 ml  Net 20 ml   Filed Weights   05/07/20 0737  Weight: 61.5 kg    Examination:  General exam: Appears calm and comfortable, not in any acute  distress. Respiratory system: Clear to auscultation. Respiratory effort normal. Cardiovascular system: S1 & S2 heard, RRR. No JVD, murmurs, rubs, gallops or clicks. No pedal edema. Gastrointestinal system: Abdomen is nondistended, soft and nontender. No organomegaly or masses felt. Normal bowel sounds heard. Central nervous system: Alert and oriented. No focal neurological deficits. Extremities: Not assessed,  patient has been not following directions. Skin: No rashes, lesions or ulcers Psychiatry: Not assessed    Data Reviewed: I have personally reviewed following labs and imaging studies  CBC: Recent Labs  Lab 05/10/20 0620 05/10/20 1151 05/11/20  0602 05/12/20 0559 05/13/20 0822 05/14/20 0323 05/15/20 0619 05/16/20 0559  WBC 4.7  --  4.2 4.2 4.4 4.5 4.2  --   NEUTROABS 2.7  --  2.3 2.2 2.2 2.7  --   --   HGB 8.9*   < > 9.0* 9.3* 11.1* 9.1* 9.2* 9.1*  HCT 26.7*   < > 26.7* 28.2* 33.8* 27.5* 27.6* 27.3*  MCV 90.5  --  89.9 92.2 93.9 92.6 92.6  --   PLT 120*  --  125* 138* 171 148* 162  --    < > = values in this interval not displayed.   Basic Metabolic Panel: Recent Labs  Lab 05/10/20 0620 05/11/20 0602 05/12/20 0559 05/14/20 0323 05/15/20 0619 05/16/20 0559  NA 140 142 141 140 139 140  K 3.3* 3.4* 3.8 4.2 4.1 3.7  CL 108 109 107 108 106 106  CO2 26 26 27 25 28 29   GLUCOSE 74 74 71 81 97 81  BUN 17 11 7* 16 16 24*  CREATININE 0.43* 0.31* 0.42* 0.46* 0.57* 0.52*  CALCIUM 7.8* 7.9* 8.1* 8.4* 8.4* 8.5*  MG 1.7 2.0 2.3 1.9 1.9 1.8  PHOS 2.6  --   --   --  3.0 2.5   GFR: Estimated Creatinine Clearance: 67.3 mL/min (A) (by C-G formula based on SCr of 0.52 mg/dL (L)). Liver Function Tests: No results for input(s): AST, ALT, ALKPHOS, BILITOT, PROT, ALBUMIN in the last 168 hours. No results for input(s): LIPASE, AMYLASE in the last 168 hours. No results for input(s): AMMONIA in the last 168 hours. Coagulation Profile: No results for input(s): INR, PROTIME in the  last 168 hours. Cardiac Enzymes: No results for input(s): CKTOTAL, CKMB, CKMBINDEX, TROPONINI in the last 168 hours. BNP (last 3 results) No results for input(s): PROBNP in the last 8760 hours. HbA1C: No results for input(s): HGBA1C in the last 72 hours. CBG: No results for input(s): GLUCAP in the last 168 hours. Lipid Profile: No results for input(s): CHOL, HDL, LDLCALC, TRIG, CHOLHDL, LDLDIRECT in the last 72 hours. Thyroid Function Tests: No results for input(s): TSH, T4TOTAL, FREET4, T3FREE, THYROIDAB in the last 72 hours. Anemia Panel: No results for input(s): VITAMINB12, FOLATE, FERRITIN, TIBC, IRON, RETICCTPCT in the last 72 hours. Sepsis Labs: No results for input(s): PROCALCITON, LATICACIDVEN in the last 168 hours.  Recent Results (from the past 240 hour(s))  Culture, blood (routine x 2)     Status: Abnormal   Collection Time: 05/07/20  7:40 AM   Specimen: BLOOD  Result Value Ref Range Status   Specimen Description   Final    BLOOD LEFT ANTECUBITAL Performed at  [facility], [address] [place], [place] [address]    Special Requests   Final    BOTTLES DRAWN AEROBIC AND ANAEROBIC Blood Culture results may not be optimal due to an excessive volume of blood received in culture bottles Performed at [facility], [address], [place], [place] [address]    Culture  Setup Time   Final    GRAM NEGATIVE RODS IN BOTH AEROBIC AND ANAEROBIC BOTTLES CRITICAL RESULT CALLED TO, READ BACK BY AND VERIFIED WITH: Esaw Dalton PHARMD @0821  05/12/20 EB Performed at [facility], [place] [address], [place], [place] [address]    Culture PSEUDOMONAS AERUGINOSA (A)  Final   Report Status 05/13/2020 FINAL  Final   Organism ID, Bacteria PSEUDOMONAS AERUGINOSA  Final      Susceptibility   Pseudomonas aeruginosa - MIC*  CEFTAZIDIME <=1 SENSITIVE Sensitive     CIPROFLOXACIN <=0.25 SENSITIVE Sensitive     GENTAMICIN <=1 SENSITIVE Sensitive     IMIPENEM <=0.25  SENSITIVE Sensitive     PIP/TAZO <=4 SENSITIVE Sensitive     CEFEPIME 2 SENSITIVE Sensitive     * PSEUDOMONAS AERUGINOSA  Culture, blood (routine x 2)     Status: None   Collection Time: 05/07/20  7:40 AM   Specimen: BLOOD  Result Value Ref Range Status   Specimen Description BLOOD RIGHT ANTECUBITAL  Final   Special Requests   Final    BOTTLES DRAWN AEROBIC AND ANAEROBIC Blood Culture adequate volume   Culture   Final    NO GROWTH 5 DAYS Performed at [facility], [address] [place], [place] [address]    Report Status 05/12/2020 FINAL  Final  Resp Panel by RT-PCR (Flu A&B, Covid) Nasopharyngeal Swab     Status: None   Collection Time: 05/07/20  8:26 AM   Specimen: Nasopharyngeal Swab; Nasopharyngeal(NP) swabs in vial transport medium  Result Value Ref Range Status   SARS Coronavirus 2 by RT PCR NEGATIVE NEGATIVE Final    Comment: (NOTE) SARS-CoV-2 target nucleic acids are NOT DETECTED.  The SARS-CoV-2 RNA is generally detectable in upper respiratory specimens during the acute phase of infection. The lowest concentration of SARS-CoV-2 viral copies this assay can detect is 138 copies/mL. A negative result does not preclude SARS-Cov-2 infection and should not be used as the sole basis for treatment or other patient management decisions. A negative result may occur with  improper specimen collection/handling, submission of specimen other than nasopharyngeal swab, presence of viral mutation(s) within the areas targeted by this assay, and inadequate number of viral copies(<138 copies/mL). A negative result must be combined with clinical observations, patient history, and epidemiological information. The expected result is Negative.  Fact Sheet for Patients:  EntrepreneurPulse.com.au  Fact Sheet for Healthcare Providers:  IncredibleEmployment.be  This test is no t yet approved or cleared by the [place] FDA and  has been  authorized for detection and/or diagnosis of SARS-CoV-2 by FDA under an Emergency Use Authorization (EUA). This EUA will remain  in effect (meaning this test can be used) for the duration of the COVID-19 declaration under Section 564(b)(1) of the Act, 21 U.S.C.section 360bbb-3(b)(1), unless the authorization is terminated  or revoked sooner.       Influenza A by PCR NEGATIVE NEGATIVE Final   Influenza B by PCR NEGATIVE NEGATIVE Final    Comment: (NOTE) The Xpert Xpress SARS-CoV-2/FLU/RSV plus assay is intended as an aid in the diagnosis of influenza from Nasopharyngeal swab specimens and should not be used as a sole basis for treatment. Nasal washings and aspirates are unacceptable for Xpert Xpress SARS-CoV-2/FLU/RSV testing.  Fact Sheet for Patients: EntrepreneurPulse.com.au  Fact Sheet for Healthcare Providers: IncredibleEmployment.be  This test is not yet approved or cleared by the [place] FDA and has been authorized for detection and/or diagnosis of SARS-CoV-2 by FDA under an Emergency Use Authorization (EUA). This EUA will remain in effect (meaning this test can be used) for the duration of the COVID-19 declaration under Section 564(b)(1) of the Act, 21 U.S.C. section 360bbb-3(b)(1), unless the authorization is terminated or revoked.  Performed at [facility], [address], [place], [place] [address]   MRSA PCR Screening     Status: Abnormal   Collection Time: 05/11/20  2:26 AM   Specimen: Nasopharyngeal  Result Value Ref Range Status   MRSA  by PCR (A) NEGATIVE Final    INVALID, UNABLE TO DETERMINE THE PRESENCE OF TARGET DUE TO SPECIMEN INTEGRITY. RECOLLECTION REQUESTED.    Comment:        The GeneXpert MRSA Assay (FDA approved for NASAL specimens only), is one component of a comprehensive MRSA colonization surveillance program. It is not intended to diagnose MRSA infection nor to guide or monitor treatment  for MRSA infections. REQUEST FOR RECOLLECT CALLED TO JACKIE ATWATER AT [id number] ON 05/11/2020 [place]. Performed at [facility], [address] [place], [place] [address]   MRSA PCR Screening     Status: None   Collection Time: 05/11/20  2:14 PM  Result Value Ref Range Status   MRSA by PCR NEGATIVE NEGATIVE Final    Comment:        The GeneXpert MRSA Assay (FDA approved for NASAL specimens only), is one component of a comprehensive MRSA colonization surveillance program. It is not intended to diagnose MRSA infection nor to guide or monitor treatment for MRSA infections. Performed at [facility], [address] [place], [place] [address]   CULTURE, BLOOD (ROUTINE X 2) w Reflex to ID Panel     Status: None (Preliminary result)   Collection Time: 05/12/20  1:59 PM   Specimen: BLOOD  Result Value Ref Range Status   Specimen Description BLOOD BLOOD LEFT HAND  Final   Special Requests   Final    BOTTLES DRAWN AEROBIC AND ANAEROBIC Blood Culture results may not be optimal due to an inadequate volume of blood received in culture bottles   Culture   Final    NO GROWTH 4 DAYS Performed at [facility], [address] [place], [place] [address]    Report Status PENDING  Incomplete  CULTURE, BLOOD (ROUTINE X 2) w Reflex to ID Panel     Status: None (Preliminary result)   Collection Time: 05/12/20  2:11 PM   Specimen: BLOOD  Result Value Ref Range Status   Specimen Description BLOOD LEFT ANTECUBITAL  Final   Special Requests   Final    BOTTLES DRAWN AEROBIC AND ANAEROBIC Blood Culture results may not be optimal due to an inadequate volume of blood received in culture bottles   Culture   Final    NO GROWTH 4 DAYS Performed at [facility], [address] [place], [place] [address]    Report Status PENDING  Incomplete    Radiology Studies: No results found.  Scheduled Meds: . alfuzosin  10 mg Oral Daily  . ascorbic acid  500 mg Oral Daily  .  aspirin EC  81 mg Oral Daily  . atorvastatin  40 mg Oral QPM  . calcium-vitamin D  1 tablet Oral BID  . enoxaparin (LOVENOX) injection  40 mg Subcutaneous Q24H  . folic acid  1 mg Oral Daily  . Gerhardt's butt cream   Topical QID  . lubiprostone  24 mcg Oral BID WC  . mouth rinse  15 mL Mouth Rinse BID  . pantoprazole  40 mg Oral BID  . polyethylene glycol  34 g Oral BID  . Ensure Max Protein  1 each Oral BID  . sertraline  100 mg Oral Daily   Continuous Infusions: . sodium chloride 150 mL/hr at 05/10/20 1331  . sodium chloride 10 mL/hr at 05/13/20 2325  . ceFEPime (MAXIPIME) IV 2 g (05/16/20 0545)     LOS: 9 days    Time spent: 25 mins    Atasha Colebank Dwyane Dee, MD  [facility]   If 7PM-7AM, please contact night-coverage

## 2020-05-17 DIAGNOSIS — D649 Anemia, unspecified: Secondary | ICD-10-CM | POA: Diagnosis not present

## 2020-05-17 DIAGNOSIS — G809 Cerebral palsy, unspecified: Secondary | ICD-10-CM | POA: Diagnosis not present

## 2020-05-17 DIAGNOSIS — R7881 Bacteremia: Secondary | ICD-10-CM

## 2020-05-17 DIAGNOSIS — B965 Pseudomonas (aeruginosa) (mallei) (pseudomallei) as the cause of diseases classified elsewhere: Secondary | ICD-10-CM

## 2020-05-17 LAB — CULTURE, BLOOD (ROUTINE X 2)
Culture: NO GROWTH
Culture: NO GROWTH

## 2020-05-17 LAB — HEMOGLOBIN AND HEMATOCRIT, BLOOD
HCT: 27.9 % — ABNORMAL LOW (ref 39.0–52.0)
Hemoglobin: 9.1 g/dL — ABNORMAL LOW (ref 13.0–17.0)

## 2020-05-17 MED ORDER — LEVOFLOXACIN 500 MG PO TABS
750.0000 mg | ORAL_TABLET | Freq: Every day | ORAL | Status: DC
  Administered 2020-05-17: 750 mg via ORAL
  Filled 2020-05-17: qty 2

## 2020-05-17 MED ORDER — LEVOFLOXACIN 750 MG PO TABS: 750.0000 mg | ORAL_TABLET | Freq: Every day | ORAL | 0 refills | Status: DC

## 2020-05-17 NOTE — Progress Notes (Signed)
Name: [name]   MRN: [id number]    DOB: 03/17/1941   Date:05/18/2020       Progress Note  [facility] Follow up   I connected with  [name]  on 05/18/20 at  9:20 AM EST by telephone application and verified that I am speaking with the correct person using two identifiers.  I discussed the limitations of evaluation and management by telemedicine and the availability of in person appointments. The patient expressed understanding and agreed to proceed with the virtual visit  Staff also discussed with the patient that there may be a patient responsible charge related to this service. Patient Location: group home  Provider Location: [facility] Additional Individuals present: [facility] discharge follow up Discharge diagnosis: Health Acquired Pneumonia with hypoxia , lower GI bleed, anemia that required transfusion, brief episode of delirium  Admission date: 05/07/2020 DC date: 05/17/2020  [name] has cerebral palsy and intellectual disability that  lives in a group home. He was able to tell me that he feels much better and was happy to be out of the hospital   He was admitted on 04/19/2020 and discharged on 04/23/2020 for SOB, fever and hypotension. He was diagnosed with sepsis, pneumonia and possible UTI. He was given Cefepime and Vanco on admission and placed on ICU His kidney function dropped .During his stay antibiotics were changed to Zosyn and upon discharged he was sent home with Augmentin.   On 05/07/2020 he went back to [facility] because of increase in SOB, cough and confusion, his pulse ox down to 83 % on arrival to [facility] but improved with oxygen, he was hypotensive but improved with fluids. WBC was over 24 and CXR showed bilateral patchy infiltrates . He was sent to medical floor on Vancomycin, and cefepime with diagnosis of HCAP Blood culture grew pseudomonas. His stay was complicated by lower GI bleed, he had a normal EGD, sigmoidoscopy  was unremarkable but colonoscopy not done due to poor prep. He had 3 units of blood during his stay. He was sent home feeling better on 05/17/2020 on Levaquin.   On discharge summary advised to recheck CBC and Comp panel in one week and we will do so. Also advised to monitor vital signs and monitor food intake and behavior.   Reviewed medication. Levaquin was accidentally discharged, but he is still taking it and will complete the 7 days     Patient Active Problem List   Diagnosis Date Noted  . Acute blood loss anemia 05/09/2020  . Rectal bleeding 05/09/2020  . Hematochezia   . HCAP (healthcare-associated pneumonia) 05/07/2020  . BPH (benign prostatic hyperplasia)   . Hyperlipidemia   . Depression with anxiety   . Acute metabolic encephalopathy   . Protein-calorie malnutrition, severe 04/22/2020  . Pressure injury of skin 04/22/2020  . Multifocal pneumonia   . Sepsis due to undetermined organism ([place]) 04/19/2020  . Personal history of urinary calculi 06/21/2019  . PAD (peripheral artery disease) ([place]) 07/04/2017  . Aortic valve calcification 07/04/2017  . Coronary artery calcification 07/04/2017  . Late effect acute polio 05/17/2016  . Chronic constipation 11/19/2015  . Mild major depression ([place]) 11/19/2015  . Bacteriuria, chronic 09/11/2015  . History of prostate cancer 03/02/2015  . Amyotrophia 03/02/2015  . Edema leg 03/02/2015  . Cerebral palsy ([place]) 03/02/2015  . Dyslipidemia 03/02/2015  . Dermatitis, eczematoid 03/02/2015  . 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 H/O acute poliomyelitis 03/02/2015  .  Lymphedema 03/02/2015  . Intellectual disability 03/02/2015  . Hypertrophy of nail 03/02/2015  . Chronic venous insufficiency 03/02/2015  . Avitaminosis D 03/02/2015  . Adynamia 03/02/2015  . Dependent on wheelchair 03/02/2015  . Renal cysts, acquired, bilateral 03/02/2015  . At risk for falling 03/02/2015  . Cerebral vascular disease 03/02/2015  . Strabismus 02/11/2015  .  Urge incontinence of urine 02/11/2015  . Wheelchair dependence 02/11/2015  . Urinary frequency 10/30/2014    Past Surgical History:  Procedure Laterality Date  . COLONOSCOPY WITH PROPOFOL N/A 05/10/2020   Procedure: COLONOSCOPY WITH PROPOFOL;  Surgeon: Lin Landsman, MD;  Location: [facility] ENDOSCOPY;  Service: Gastroenterology;  Laterality: N/A;  . COLONOSCOPY WITH PROPOFOL N/A 05/12/2020   Procedure: COLONOSCOPY WITH PROPOFOL;  Surgeon: Lin Landsman, MD;  Location: [facility] ENDOSCOPY;  Service: Gastroenterology;  Laterality: N/A;  . CYSTOSCOPY/URETEROSCOPY/HOLMIUM LASER/STENT PLACEMENT Right 08/14/2017   Procedure: CYSTOSCOPY/URETEROSCOPY/HOLMIUM LASER/STENT PLACEMENT;  Surgeon: Abbie Sons, MD;  Location: [facility] ORS;  Service: Urology;  Laterality: Right;  . ESOPHAGOGASTRODUODENOSCOPY (EGD) WITH PROPOFOL N/A 05/12/2020   Procedure: ESOPHAGOGASTRODUODENOSCOPY (EGD) WITH PROPOFOL;  Surgeon: Lin Landsman, MD;  Location: [facility] ENDOSCOPY;  Service: Gastroenterology;  Laterality: N/A;  . leg circulation surgery Right   . PROSTATE SURGERY  11/10/2008   BRACHYTHERAPY    Family History  Problem Relation Age of Onset  . Heart attack Brother   . Heart disease Other     Social History   Socioeconomic History  . Marital status: Single    Spouse name: Not on file  . Number of children: Not on file  . Years of education: Not on file  . Highest education level: Not on file  Occupational History  . Not on file  Tobacco Use  . Smoking status: Never Smoker  . Smokeless tobacco: Never Used  Vaping Use  . Vaping Use: Never used  Substance and Sexual Activity  . Alcohol use: No    Alcohol/week: 0.0 standard drinks  . Drug use: No  . Sexual activity: Not Currently  Other Topics Concern  . Not on file  Social History Narrative   Lives at [facility] group home, 5 males. Moved to higher level of care 04/28/19 with [name].     Social Determinants of Health   Financial  Resource Strain: Low Risk   . Difficulty of Paying Living Expenses: Not hard at all  Food Insecurity: No Food Insecurity  . Worried About Charity fundraiser in the Last Year: Never true  . Ran Out of Food in the Last Year: Never true  Transportation Needs: No Transportation Needs  . Lack of Transportation (Medical): No  . Lack of Transportation (Non-Medical): No  Physical Activity: Inactive  . Days of Exercise per Week: 0 days  . Minutes of Exercise per Session: 0 min  Stress: No Stress Concern Present  . Feeling of Stress : Not at all  Social Connections: Socially Isolated  . Frequency of Communication with Friends and Family: Never  . Frequency of Social Gatherings with Friends and Family: Never  . Attends Religious Services: Never  . Active Member of Clubs or Organizations: No  . Attends Archivist Meetings: Never  . Marital Status: Never married  Intimate Partner Violence: Not At Risk  . Fear of Current or Ex-Partner: No  . Emotionally Abused: No  . Physically Abused: No  . Sexually Abused: No     Current Outpatient Medications:  .  acetaminophen (TYLENOL) 325 MG tablet, TAKE  2 TABLETS (650 MG TOTAL) BY MOUTH EVERY 6 HOURS AS NEEDED FOR MILD PAIN OR HEADACHE., Disp: 60 tablet, Rfl: 5 .  alfuzosin (UROXATRAL) 10 MG 24 hr tablet, Take 1 tablet (10 mg total) by mouth daily., Disp: 90 tablet, Rfl: 1 .  ALPRAZolam (XANAX) 0.25 MG tablet, TAKE ONE TABLET BY MOUTH 2 TIMES A DAY AS NEEDED FOR ANXIETY (WHEN BLOOD PRESSURE IS VERY HIGH OR ANXIOUS) (Patient taking differently: Take 0.25 mg by mouth 2 (two) times daily as needed for anxiety or sleep. TAKE ONE TABLET BY MOUTH 2 TIMES A DAY AS NEEDED FOR ANXIETY (WHEN BLOOD PRESSURE IS VERY HIGH OR ANXIOUS)), Disp: 20 tablet, Rfl: 0 .  ARTIFICIAL TEARS 0.2-0.2-1 % SOLN, PLACE 2 DROPS INTO EYE 2 TIMES A DAY AS NEEDED, Disp: 15 mL, Rfl: 1 .  ascorbic acid (VITAMIN C) 500 MG tablet, TAKE ONE TABLET BY MOUTH ONCE DAILY (Patient taking  differently: Take 500 mg by mouth daily.), Disp: 90 tablet, Rfl: 1 .  aspirin (ASPIR-LOW) 81 MG EC tablet, TAKE 1 TABLET BY MOUTH ONCE DAILY (Patient taking differently: Take 81 mg by mouth daily. TAKE 1 TABLET BY MOUTH ONCE DAILY), Disp: 90 tablet, Rfl: 1 .  atorvastatin (LIPITOR) 40 MG tablet, Take 1 tablet (40 mg total) by mouth every evening., Disp: 90 tablet, Rfl: 1 .  augmented betamethasone dipropionate (DIPROLENE-AF) 0.05 % cream, APPLY TOPICALLY AS DIRECTED TO RASH AS NEEDED, Disp: 45 g, Rfl: 0 .  Calcium Carb-Cholecalciferol (CALCIUM-VITAMIN D) 500-400 MG-UNIT TABS, TAKE 1 TABLET BY MOUTH 2 TIMES A DAY, Disp: 180 tablet, Rfl: 0 .  Cranberry Juice Powder 425 MG CAPS, TAKE 1 CAPSULE BY MOUTH ONCE DAILY (Patient taking differently: Take 425 mg by mouth daily.), Disp: 90 capsule, Rfl: 1 .  Elastic Bandages & Supports (MEDICAL COMPRESSION STOCKINGS) MISC, 1 Units by Does not apply route daily., Disp: 2 each, Rfl: 0 .  Incontinence Supply Disposable (DEPEND SILHOUETTE BRIEFS L/XL) MISC, 1 each by Does not apply route 2 (two) times daily as needed., Disp: 60 each, Rfl: 5 .  lubiprostone (AMITIZA) 24 MCG capsule, TAKE 1 CAPSULE BY MOUTH 2 TIMES DAILY WITH A MEAL (Patient taking differently: Take 24 mcg by mouth 2 (two) times daily with a meal. TAKE 1 CAPSULE BY MOUTH 2 TIMES DAILY WITH A MEAL), Disp: 180 capsule, Rfl: 1 .  Nutritional Supplements (ENSURE NUTRITION SHAKE) LIQD, Take 1 each by mouth 2 (two) times daily., Disp: 237 mL, Rfl: 30 .  Polyethyl Glycol-Propyl Glycol (SYSTANE ULTRA) 0.4-0.3 % SOLN, Apply 1 drop to eye 2 (two) times daily. Both eyes, Disp: 30 mL, Rfl: 5 .  polyethylene glycol (MIRALAX / GLYCOLAX) 17 g packet, Take 34 g by mouth 2 (two) times daily as needed., Disp: 30 each, Rfl: 0 .  sertraline (ZOLOFT) 100 MG tablet, Take 1 tablet (100 mg total) by mouth daily., Disp: 90 tablet, Rfl: 1  No Known Allergies  I personally reviewed active problem list, medication list,  allergies, family history, social history, health maintenance with the patient/caregiver today.   ROS  Ten systems reviewed and is negative except as mentioned in HPI   Objective  Virtual encounter, vitals not obtained.  Body mass index is 19.8 kg/m.  Physical Exam  Awake, alert and oriented  Results for orders placed or performed during the hospital encounter of 05/07/20 (from the past 72 hour(s))  Basic metabolic panel     Status: Abnormal   Collection Time: 05/16/20  5:59 AM  Result  Value Ref Range   Sodium 140 135 - 145 mmol/L   Potassium 3.7 3.5 - 5.1 mmol/L   Chloride 106 98 - 111 mmol/L   CO2 29 22 - 32 mmol/L   Glucose, Bld 81 70 - 99 mg/dL    Comment: Glucose reference range applies only to samples taken after fasting for at least 8 hours.   BUN 24 (H) 8 - 23 mg/dL   Creatinine, Ser 0.52 (L) 0.61 - 1.24 mg/dL   Calcium 8.5 (L) 8.9 - 10.3 mg/dL   GFR, Estimated >60 >60 mL/min    Comment: (NOTE) Calculated using the CKD-EPI Creatinine Equation (2021)    Anion gap 5 5 - 15    Comment: Performed at [facility], [address] [place], [place] [address]  Magnesium     Status: None   Collection Time: 05/16/20  5:59 AM  Result Value Ref Range   Magnesium 1.8 1.7 - 2.4 mg/dL    Comment: Performed at [facility], [address] [place], [place] [address]  Phosphorus     Status: None   Collection Time: 05/16/20  5:59 AM  Result Value Ref Range   Phosphorus 2.5 2.5 - 4.6 mg/dL    Comment: Performed at [facility], [address] [place], [place] [address]  Hemoglobin and hematocrit, blood     Status: Abnormal   Collection Time: 05/16/20  5:59 AM  Result Value Ref Range   Hemoglobin 9.1 (L) 13.0 - 17.0 g/dL   HCT 27.3 (L) 39.0 - 52.0 %    Comment: Performed at [facility], [address] [place], [place] [address]  Hemoglobin and hematocrit, blood     Status: Abnormal   Collection Time: 05/17/20  3:48 AM  Result Value  Ref Range   Hemoglobin 9.1 (L) 13.0 - 17.0 g/dL   HCT 27.9 (L) 39.0 - 52.0 %    Comment: Performed at [facility], [address] [place],  [address]    PHQ2/9: Depression screen [facility] 2/9 05/18/2020 03/01/2020 01/20/2020 08/07/2019 07/31/2019  Decreased Interest 0 0 0 0 0  Down, Depressed, Hopeless 0 2 0 0 0  PHQ - 2 Score 0 2 0 0 0  Altered sleeping - 0 - 0 -  Tired, decreased energy - 0 - 0 -  Change in appetite - 0 - 0 -  Feeling bad or failure about yourself  - 0 - 0 -  Trouble concentrating - 0 - 0 -  Moving slowly or fidgety/restless - 0 - 0 -  Suicidal thoughts - 0 - 0 -  PHQ-9 Score - 2 - 0 -  Difficult doing work/chores - Somewhat difficult - Not difficult at all -  Some recent data might be hidden   PHQ-2/9 Result is negative.    Fall Risk: Fall Risk  05/18/2020 03/01/2020 01/20/2020 08/29/2019 08/07/2019  Falls in the past year? 0 0 0 0 1  Number falls in past yr: 0 0 0 0 1  Injury with Fall? 0 0 0 0 1  Risk Factor Category  - - - - -  Risk for fall due to : - - Impaired mobility;Mental status change - Impaired balance/gait;Impaired mobility;History of fall(s)  Follow up - - - - -     Assessment & Plan  1. Hospital discharge follow-up  - CBC with Differential/Platelet - BASIC METABOLIC PANEL WITH GFR  2. HCAP (healthcare-associated pneumonia)   3. Lower GI bleed  Keep follow up  with Dr. Marius Ditch  4. Acute blood loss anemia  He will return next week to recheck labs   5. History of blood transfusion  Recheck labs next week   I discussed the assessment and treatment plan with the patient. The patient was provided an opportunity to ask questions and all were answered. The patient agreed with the plan and demonstrated an understanding of the instructions.  The patient was advised to call back or seek an in-person evaluation if the symptoms worsen or if the condition fails to improve as anticipated.  I provided 25  minutes of non-face-to-face time  during this encounter.

## 2020-05-17 NOTE — Plan of Care (Signed)

## 2020-05-17 NOTE — Care Management Important Message (Signed)
Important Message  Patient Details  Name: [name] MRN: [id number] Date of Birth: [birth year]   Medicare Important Message Given:  Yes  Talked with Anselm Jungling, legal guardian again today to review the Important Message from Medicare.  She was in agreement with the discharge plan today.  I thanked her for her time.   Juliann Pulse A Sofija Antwi 05/17/2020, 1:41 PM

## 2020-05-17 NOTE — Discharge Instructions (Signed)
Advised to follow-up with primary care physician in 1 week.   Advised to take Levaquin 750 mg daily for next 6 days for Pseudomonas bacteremia. Follow gastroenterology Dr. Marius Ditch in 2 to 3 weeks.

## 2020-05-17 NOTE — Discharge Summary (Signed)
Physician Discharge Summary  [name] NGE:[id number] DOB: 03/18/1941 DOA: 05/07/2020  PCP: Steele Sizer, MD  Admit date: 05/07/2020.  Discharge date: 05/17/2020.  Admitted From: Home.  Disposition: Skilled nursing facility  Recommendations for Outpatient Follow-up:  1. Follow up with PCP in 1-2 weeks. 2. Please obtain BMP/CBC in one week. 3. Advised to take Levaquin 750 mg daily for next 6 days for Pseudomonas bacteremia. 4. Advised to follow-up with GI Dr. Marius Ditch in 2 to 3 weeks.  Home Health: None Equipment/Devices: None  Discharge Condition: Stable CODE STATUS:Full code Diet recommendation: Heart Healthy   Brief Summary / Hospital course: This 80 y.o.Mal[place]ith PMH significant ofcerebral palsy,nonverbal at baseline, hyperlipidemia, wheelchair-bound, prostate cancer, BPH, lymphedema, PAD, depression, anxiety, who presents with worsening shortness of breath.He had transient hypoxemia, which later improved. Patient also had significant leukocytosis, He was diagnosed with healthcare associated pneumonia, and was treated with cefepime and vancomycin.  Blood cultures grew Pseudomonas.  Infectious disease was consulted.  Patient needs total 2 weeks of antibiotics. Patient developed fresh red blood per rectum on 1/15, GI consult was obtained. Patient also received3unitsof PRBC. EGD was performed which was without abnormality. Sigmoidoscopy did not show any abnormality.  Full colonoscopy could not be performed due to poor preparation.  GI recommended no further GI work-up at this time.  Patient had a brief episodes of delirium, he was restless and agitated but has improved.  Patient seems at his baseline mental status on the day of discharge.  Patient's labs are improved.  Infectious disease recommended patient can be discharged on Levaquin 750 mg daily for 6 more days. Patient is being discharged to skilled nursing facility.  Patient is being discharged on Levaquin for 6 more days  for Pseudomonas bacteremia.   He was managed for below problems.   Discharge Diagnoses:  Principal Problem:   HCAP (healthcare-associated pneumonia) Active Problems:   Cerebral palsy (HCC)   BPH (benign prostatic hyperplasia)   Hyperlipidemia   Depression with anxiety   Acute metabolic encephalopathy   Hematochezia   Acute blood loss anemia   Rectal bleeding   Healthcare associated pneumonia: Most likely aspiration pneumonia. Pseudomonas bacteremia. Acute metabolic encephalopathy. Cerebral palsy and quadriplegia. Patient had blood culture performed , came + with Pseudomonas in 1 of 2 bottles.   Antibiotics changed to cefepime for coverage. Final results Pseudomonas susceptible to Cipro.  continue 2 weeks of Cipro. Repeat 2 sets of blood culture,negative so far. Discussed with infectious disease,  recommended 6 more days of Levaquin.  Acute blood loss anemia secondary to rectal bleeding. Fresh red blood per rectum. Bleeding scan and CT angiogram did not show any abnormality. EGD without abnormality. Sigmoid colonoscopy did not show any abnormality,  full colonoscopy could not be performed due to poor prep. Bleeding has stopped. Hb remains stable above 9.0 GI is not doing additional work-up, patient has been cleared for discharge.  We discussed with the patient family, there is a risk for rebleeding in the future.  He need to come back to the hospital if that happens.  Hypokalemia >> Resolved.   Hypophosphatemia >>> Resolved. Condition had improved.  Guarded  Prognosis. Patient has a poor prognosis due to medical problems. He can be refer to palliative care if needed.   Discharge Instructions  Discharge Instructions    Call MD for:  difficulty breathing, headache or visual disturbances   Complete by: As directed    Call MD for:  persistant dizziness or light-headedness   Complete by: As  directed    Call MD for:  persistant nausea and vomiting    Complete by: As directed    Call MD for:  temperature >100.4   Complete by: As directed    Diet - low sodium heart healthy   Complete by: As directed    Diet - low sodium heart healthy   Complete by: As directed    Discharge instructions   Complete by: As directed    Advised to follow-up with primary care physician in 1 week.   Advised to take Levaquin 750 mg daily for next 6 days for Pseudomonas bacteremia.   Discharge wound care:   Complete by: As directed    RN follow with home care   Discharge wound care:   Complete by: As directed    Follow-up wound care at nursing home   Increase activity slowly   Complete by: As directed    Increase activity slowly   Complete by: As directed      Allergies as of 05/17/2020   No Known Allergies     Medication List    TAKE these medications   acetaminophen 325 MG tablet Commonly known as: TYLENOL TAKE 2 TABLETS (650 MG TOTAL) BY MOUTH EVERY 6 HOURS AS NEEDED FOR MILD PAIN OR HEADACHE.   alfuzosin 10 MG 24 hr tablet Commonly known as: UROXATRAL Take 1 tablet (10 mg total) by mouth daily.   ALPRAZolam 0.25 MG tablet Commonly known as: XANAX TAKE ONE TABLET BY MOUTH 2 TIMES A DAY AS NEEDED FOR ANXIETY (WHEN BLOOD PRESSURE IS VERY HIGH OR ANXIOUS) What changed:   how much to take  how to take this  when to take this  reasons to take this   Artificial Tears 0.2-0.2-1 % Soln Generic drug: Glycerin-Hypromellose-PEG 400 PLACE 2 DROPS INTO EYE 2 TIMES A DAY AS NEEDED   ascorbic acid 500 MG tablet Commonly known as: VITAMIN C TAKE ONE TABLET BY MOUTH ONCE DAILY   aspirin 81 MG EC tablet Commonly known as: Aspir-Low TAKE 1 TABLET BY MOUTH ONCE DAILY What changed:   how much to take  how to take this  when to take this   atorvastatin 40 MG tablet Commonly known as: LIPITOR Take 1 tablet (40 mg total) by mouth every evening.   augmented betamethasone dipropionate 0.05 % cream Commonly known as: DIPROLENE-AF APPLY  TOPICALLY AS DIRECTED TO RASH AS NEEDED   Calcium-Vitamin D 500-400 MG-UNIT Tabs TAKE 1 TABLET BY MOUTH 2 TIMES A DAY   Cranberry Juice Powder 425 MG Caps TAKE 1 CAPSULE BY MOUTH ONCE DAILY   Depend Silhouette Briefs L/XL Misc 1 each by Does not apply route 2 (two) times daily as needed.   Ensure Nutrition Shake Liqd Take 1 each by mouth 2 (two) times daily.   levofloxacin 750 MG tablet Commonly known as: Levaquin Take 1 tablet (750 mg total) by mouth daily for 6 days.   lubiprostone 24 MCG capsule Commonly known as: AMITIZA TAKE 1 CAPSULE BY MOUTH 2 TIMES DAILY WITH A MEAL What changed:   how much to take  how to take this  when to take this   Medical Compression Stockings Misc 1 Units by Does not apply route daily.   polyethylene glycol 17 g packet Commonly known as: MIRALAX / GLYCOLAX Take 34 g by mouth 2 (two) times daily as needed.   sertraline 100 MG tablet Commonly known as: ZOLOFT Take 1 tablet (100 mg total) by mouth daily.   Systane  Ultra 0.4-0.3 % Soln Generic drug: Polyethyl Glycol-Propyl Glycol Apply 1 drop to eye 2 (two) times daily. Both eyes            Discharge Care Instructions  (From admission, onward)         Start     Ordered   05/17/20 0000  Discharge wound care:       Comments: Follow-up wound care at nursing home   05/17/20 1204   05/13/20 0000  Discharge wound care:       Comments: RN follow with home care   05/13/20 1321          Follow-up Information    Steele Sizer, MD. Go on 05/18/2020.   Specialty: Family Medicine Why: 9:20 am Contact information: [address] [place] [address] [phone]        Lin Landsman, MD On 06/11/2020.   Specialty: Gastroenterology Why: 10:30 Contact information: [place] [address] [phone]        Abbie Sons, MD. Schedule an appointment as soon as possible for a visit in 3 weeks.   Specialty: Urology Why: Bladder  abnormality on ct Contact information: [address] [place] [address] [phone]              No Known Allergies  Consultations:  Gastroenterology  Infectious disease.   Procedures/Studies: CT Head Wo Contrast  Result Date: 05/07/2020 CLINICAL DATA:  Mental status changes of unknown etiology.  Hypoxia. EXAM: CT HEAD WITHOUT CONTRAST TECHNIQUE: Contiguous axial images were obtained from the base of the skull through the vertex without intravenous contrast. COMPARISON:  05/27/2019 FINDINGS: Brain: No acute CT finding. Mild age related volume loss. Mild chronic small-vessel change of the hemispheric deep white matter. No mass lesion, hemorrhage, hydrocephalus or extra-axial collection. Vascular: No abnormal vascular finding. Skull: Negative Sinuses/Orbits: Clear/normal Other: None IMPRESSION: No acute or reversible finding. Mild age related volume loss. Mild chronic small-vessel change of the hemispheric deep white matter. Electronically Signed   By: Nelson Chimes M.D.   On: 05/07/2020 08:24   NM GI Blood Loss  Result Date: 05/09/2020 CLINICAL DATA:  80 year old male with recurrent bright red blood per rectum on 05/08/2020. EXAM: NUCLEAR MEDICINE GASTROINTESTINAL BLEEDING SCAN TECHNIQUE: Sequential abdominal images were obtained following intravenous administration of Tc-36m labeled red blood cells. RADIOPHARMACEUTICALS:  22.0 mCi Tc-28m pertechnetate in-vitro labeled red cells. COMPARISON:  CT Abdomen and Pelvis 07/02/2017. FINDINGS: 2 hour exam was performed. Normal blood pool activity demonstrated in the 1st hour, along with accumulation of radiotracer in the penis. Similar activity throughout the 2nd hour of the study, with addition of radiotracer in the urinary bladder. There is no radiotracer accumulation specific for active gastrointestinal bleeding. IMPRESSION: No active gastrointestinal bleeding identified on 2 hour tagged-red-cell scan. Electronically Signed    By: Genevie Ann M.D.   On: 05/09/2020 05:00   DG Chest Portable 1 View  Result Date: 05/07/2020 CLINICAL DATA:  Weakness.  Shortness of breath. EXAM: PORTABLE CHEST 1 VIEW COMPARISON:  04/19/2020 FINDINGS: Heart size upper limits of normal. Chronic aortic atherosclerotic calcification. Patchy bilateral lung density persists, similar to the study 3 weeks ago. Findings could relate to bronchitis and pneumonia. No dense consolidation or lobar collapse. No effusion to suggest congestive heart failure. IMPRESSION: Persistent patchy bilateral lung density, similar to the study 3 weeks ago. Findings could relate to bronchitis and pneumonia. No dense consolidation or lobar collapse. Electronically Signed   By:  Paulina Fusi M.D.   On: 05/07/2020 08:22   DG Chest Portable 1 View  Result Date: 04/19/2020 CLINICAL DATA:  sob EXAM: PORTABLE CHEST 1 VIEW COMPARISON:  03/09/2019 and prior. FINDINGS: Hypoinflated lungs. No pneumothorax or pleural effusion. Diffuse interstitial prominence and patchy bibasilar opacities. Cardiomediastinal silhouette within normal limits. IMPRESSION: Patchy bibasilar opacities, atelectasis versus infiltrate. Interstitial prominence may reflect chronic sequela. Electronically Signed   By: Stana Bunting M.D.   On: 04/19/2020 10:35   CT Angio Abd/Pel w/ and/or w/o  Result Date: 05/10/2020 CLINICAL DATA:  Lower GI bleeding.  Colonoscopy earlier today. EXAM: CTA ABDOMEN AND PELVIS WITHOUT AND WITH CONTRAST TECHNIQUE: Multidetector CT imaging of the abdomen and pelvis was performed using the standard protocol during bolus administration of intravenous contrast. Multiplanar reconstructed images and MIPs were obtained and reviewed to evaluate the vascular anatomy. CONTRAST:  OMNIPAQUE IOHEXOL 350 MG/ML SOLN COMPARISON:  Nuclear medicine tagged red blood cell study-05/09/2020; CT abdomen pelvis-07/02/2017; abdominal MRI-08/06/2017 FINDINGS: VASCULAR Aorta: Scattered minimal amount of  eccentric calcified and noncalcified atherosclerotic plaque within normal caliber abdominal aorta, not resulting in a hemodynamically significant narrowing. No evidence of abdominal aortic dissection or periaortic stranding. Celiac: Widely patent without a hemodynamically significant narrowing. An accessory left hepatic artery is noted to arise from the left gastric artery. Otherwise, conventional branching pattern. SMA: Widely patent without hemodynamically significant narrowing. Conventional branching pattern. The distal tributaries of the SMA are widely patent without discrete lumen filling defect. Renals: The right renal artery is duplicated with accessory, nearly could dominant right renal artery arising superior to the takeoff of the IMA and supplying the inferior pole the right kidney. Both dominant renal arteries are widely patent without hemodynamically significant narrowing. No vessel irregularity to suggest FMD. IMA: Widely patent without hemodynamically significant narrowing. Inflow: There is a minimal amount of eccentric mixed calcified and noncalcified atherosclerotic plaque involving the bilateral normal caliber common iliac arteries, not resulting in hemodynamically significant stenosis. The bilateral internal iliac arteries are disease though patent and of normal caliber. The bilateral external iliac arteries are tortuous though of normal caliber and widely patent without hemodynamically significant narrowing. Proximal Outflow: The bilateral common and imaged portions of the bilateral deep and superficial femoral arteries are widely patent without hemodynamically significant narrowing. Veins: The IVC and pelvic venous systems appear widely patent. Review of the MIP images confirms the above findings. _________________________________________________________ NON-VASCULAR Lower chest: Limited visualization of the lower thorax demonstrates trace bilateral effusions with associated bibasilar opacities,  likely atelectasis. Borderline cardiomegaly. No pericardial effusion. Hepatobiliary: Normal hepatic contour. No discrete hepatic lesions. Gallbladder appears mildly distended though without radiopaque gallstones or biliary sludge. No definitive evidence of gallbladder wall thickening. No definite intra or extrahepatic biliary ductal dilatation. No ascites. Pancreas: Normal appearance of the pancreas. Spleen: Normal appearance of the spleen. Adrenals/Urinary Tract: There is symmetric enhancement and excretion of the bilateral kidneys. Multiple stones are seen within the right renal collecting system, pelvis and superior aspect of the right ureter with dominant stone within the superior aspect of the right ureter measuring approximately 0.7 cm in maximal diameter (image 44, series 2), not resulting in urinary obstruction. Note is made of a solitary punctate (approximately 3 mm) nonobstructing stone within the interpolar aspect the left kidney (image 29, series 2). Bilateral hypoattenuating nonenhancing renal cysts are seen bilaterally with dominant left-sided parapelvic cyst measuring 2.0 cm in diameter (image 28, series 6 and dominant partially exophytic cyst arising from the anterior inferior aspect the right kidney  measuring 2.5 cm in diameter. No discrete worrisome renal lesions. No urinary obstruction or perinephric stranding. Normal appearance of the bilateral adrenal glands. There is diffuse thickening the urinary bladder wall with mild irregular thickening and potential trabeculation. Stomach/Bowel: Linear high density debris is seen within the cecum and ascending colon. Moderate colonic stool burden without evidence of enteric obstruction. There are no discrete areas of pooling intraluminal contrast extravasation to suggest the etiology of reported history of lower GI bleeding. There is diffuse wall thickening involving the cecum and proximal ascending colon as well as the distal sigmoid colon and rectum, not  resulting in enteric obstruction. No significant hiatal hernia. No pneumoperitoneum, pneumatosis or portal venous gas. Lymphatic: No bulky retroperitoneal, mesenteric, pelvic or inguinal lymphadenopathy. Reproductive: Brachytherapy seeds are seen within the prostate bed. Small amount of fluid is seen with the pelvic cul-de-sac. Other: Small right-sided direct inguinal hernia with small amount of fluid seen within the hernia as well as suspected small varicocele. Diffuse body wall anasarca. Musculoskeletal: No definite acute or aggressive osseous abnormalities. Redemonstrated bilateral L5 pars defects with associated grade 1 anterolisthesis of L5 upon S1 measuring approximately 8 mm, similar to the 2019 examination. There is partial lumbarization of the S1 vertebral body. Stigmata of dish within the lower thoracic and upper lumbar spine. Mild degenerative change the bilateral hips with joint space loss, subchondral sclerosis and osteophytosis. Deformity involving the posterior aspect of the right twelfth rib (image 24, series 2), likely the sequela of remote fracture. IMPRESSION: 1. No discrete areas of intraluminal contrast extravasation to suggest the etiology of reported history of lower GI bleeding. 2. Minimal amount of atherosclerotic plaque with a normal caliber abdominal aorta, not resulting in a hemodynamically significant stenosis. 3.  Aortic Atherosclerosis (ICD10-I70.0). NON-VASCULAR. 1. Diffuse wall thickening involving the cecum and proximal ascending colon as well as the distal sigmoid colon and rectum, nonspecific though suggestive of an enteritis. No evidence of enteric obstruction. 2. Extensive nonobstructing right-sided nephrolithiasis with solitary punctate (3 mm) nonobstructing left-sided renal stone. 3. Small right-sided direct inguinal hernia containing a small amount of fluid as well as suspected small varicocele. 4. Brachytherapy seeds within the prostate bed. 5. Diffuse thickening of the  urinary bladder wall with mild irregular thickening and potential trabeculation - clinical correlation is advised. Further evaluation with nonemergent urinalysis and/or cystoscopy could be performed as indicated. 6. Bilateral L5 pars defects with associated grade 1 anterolisthesis of L5 upon S1, similar to the 2019 examination. Electronically Signed   By: Sandi Mariscal M.D.   On: 05/10/2020 15:12   EGD, sigmoidoscopy   Subjective: Patient was seen and examined at bedside.  Overnight events noted.  Patient seems at his baseline mental status.   Patient is able to tolerate food. Hb remained stable.  Discharge Exam: Vitals:   05/17/20 0833 05/17/20 1120  BP: 105/67 120/85  Pulse: (!) 56 71  Resp: 16 17  Temp: 98 F (36.7 C) 97.7 F (36.5 C)  SpO2: 96% 95%   Vitals:   05/17/20 0057 05/17/20 0422 05/17/20 0833 05/17/20 1120  BP: 105/65 108/66 105/67 120/85  Pulse: (!) 54 (!) 55 (!) 56 71  Resp: 18 20 16 17   Temp: 97.8 F (36.6 C) 97.8 F (36.6 C) 98 F (36.7 C) 97.7 F (36.5 C)  TempSrc: Oral Oral Oral   SpO2: 98% 97% 96% 95%  Weight:        General: Pt is alert, awake, not in acute distress Cardiovascular: RRR, S1/S2 +, no rubs,  no gallops Respiratory: CTA bilaterally, no wheezing, no rhonchi Abdominal: Soft, NT, ND, bowel sounds + Extremities: no edema, no cyanosis    The results of significant diagnostics from this hospitalization (including imaging, microbiology, ancillary and laboratory) are listed below for reference.     Microbiology: Recent Results (from the past 240 hour(s))  MRSA PCR Screening     Status: Abnormal   Collection Time: 05/11/20  2:26 AM   Specimen: Nasopharyngeal  Result Value Ref Range Status   MRSA by PCR (A) NEGATIVE Final    INVALID, UNABLE TO DETERMINE THE PRESENCE OF TARGET DUE TO SPECIMEN INTEGRITY. RECOLLECTION REQUESTED.    Comment:        The GeneXpert MRSA Assay (FDA approved for NASAL specimens only), is one component of  a comprehensive MRSA colonization surveillance program. It is not intended to diagnose MRSA infection nor to guide or monitor treatment for MRSA infections. REQUEST FOR RECOLLECT CALLED TO JACKIE ATWATER AT [id number] ON 05/11/2020 [place]. Performed at [facility], [address] [place], [place] [address]   MRSA PCR Screening     Status: None   Collection Time: 05/11/20  2:14 PM  Result Value Ref Range Status   MRSA by PCR NEGATIVE NEGATIVE Final    Comment:        The GeneXpert MRSA Assay (FDA approved for NASAL specimens only), is one component of a comprehensive MRSA colonization surveillance program. It is not intended to diagnose MRSA infection nor to guide or monitor treatment for MRSA infections. Performed at [facility], [address] [place], [place] [address]   CULTURE, BLOOD (ROUTINE X 2) w Reflex to ID Panel     Status: None   Collection Time: 05/12/20  1:59 PM   Specimen: BLOOD  Result Value Ref Range Status   Specimen Description BLOOD BLOOD LEFT HAND  Final   Special Requests   Final    BOTTLES DRAWN AEROBIC AND ANAEROBIC Blood Culture results may not be optimal due to an inadequate volume of blood received in culture bottles   Culture   Final    NO GROWTH 5 DAYS Performed at [facility], [address] [place], [place] [address]    Report Status 05/17/2020 FINAL  Final  CULTURE, BLOOD (ROUTINE X 2) w Reflex to ID Panel     Status: None   Collection Time: 05/12/20  2:11 PM   Specimen: BLOOD  Result Value Ref Range Status   Specimen Description BLOOD LEFT ANTECUBITAL  Final   Special Requests   Final    BOTTLES DRAWN AEROBIC AND ANAEROBIC Blood Culture results may not be optimal due to an inadequate volume of blood received in culture bottles   Culture   Final    NO GROWTH 5 DAYS Performed at [facility], [address] [place], [place] [address]    Report Status 05/17/2020 FINAL  Final     Labs: BNP (last 3  results) Recent Labs    05/07/20 0740  BNP 0000000*   Basic Metabolic Panel: Recent Labs  Lab 05/11/20 0602 05/12/20 0559 05/14/20 0323 05/15/20 0619 05/16/20 0559  NA 142 141 140 139 140  K 3.4* 3.8 4.2 4.1 3.7  CL 109 107 108 106 106  CO2 26 27 25 28 29   GLUCOSE 74 71 81 97 81  BUN 11 7* 16 16 24*  CREATININE 0.31* 0.42* 0.46* 0.57* 0.52*  CALCIUM 7.9* 8.1* 8.4* 8.4* 8.5*  MG 2.0 2.3 1.9 1.9 1.8  PHOS  --   --   --  3.0 2.5   Liver Function Tests: No results for input(s): AST, ALT, ALKPHOS, BILITOT, PROT, ALBUMIN in the last 168 hours. No results for input(s): LIPASE, AMYLASE in the last 168 hours. No results for input(s): AMMONIA in the last 168 hours. CBC: Recent Labs  Lab 05/11/20 0602 05/12/20 0559 05/13/20 0822 05/14/20 0323 05/15/20 0619 05/16/20 0559 05/17/20 0348  WBC 4.2 4.2 4.4 4.5 4.2  --   --   NEUTROABS 2.3 2.2 2.2 2.7  --   --   --   HGB 9.0* 9.3* 11.1* 9.1* 9.2* 9.1* 9.1*  HCT 26.7* 28.2* 33.8* 27.5* 27.6* 27.3* 27.9*  MCV 89.9 92.2 93.9 92.6 92.6  --   --   PLT 125* 138* 171 148* 162  --   --    Cardiac Enzymes: No results for input(s): CKTOTAL, CKMB, CKMBINDEX, TROPONINI in the last 168 hours. BNP: Invalid input(s): POCBNP CBG: No results for input(s): GLUCAP in the last 168 hours. D-Dimer No results for input(s): DDIMER in the last 72 hours. Hgb A1c No results for input(s): HGBA1C in the last 72 hours. Lipid Profile No results for input(s): CHOL, HDL, LDLCALC, TRIG, CHOLHDL, LDLDIRECT in the last 72 hours. Thyroid function studies No results for input(s): TSH, T4TOTAL, T3FREE, THYROIDAB in the last 72 hours.  Invalid input(s): FREET3 Anemia work up No results for input(s): VITAMINB12, FOLATE, FERRITIN, TIBC, IRON, RETICCTPCT in the last 72 hours. Urinalysis    Component Value Date/Time   COLORURINE YELLOW (A) 05/11/2020 0225   APPEARANCEUR CLEAR (A) 05/11/2020 0225   APPEARANCEUR Cloudy (A) 06/20/2019 1141   LABSPEC 1.020  05/11/2020 0225   LABSPEC 1.012 05/25/2013 1506   PHURINE 6.0 05/11/2020 0225   GLUCOSEU NEGATIVE 05/11/2020 0225   GLUCOSEU Negative 05/25/2013 1506   HGBUR SMALL (A) 05/11/2020 0225   BILIRUBINUR NEGATIVE 05/11/2020 0225   BILIRUBINUR Negative 06/20/2019 1141   BILIRUBINUR Negative 05/25/2013 1506   KETONESUR NEGATIVE 05/11/2020 0225   PROTEINUR NEGATIVE 05/11/2020 0225   UROBILINOGEN 0.2 10/03/2018 1032   NITRITE NEGATIVE 05/11/2020 0225   LEUKOCYTESUR MODERATE (A) 05/11/2020 0225   LEUKOCYTESUR Negative 05/25/2013 1506   Sepsis Labs Invalid input(s): PROCALCITONIN,  WBC,  LACTICIDVEN Microbiology Recent Results (from the past 240 hour(s))  MRSA PCR Screening     Status: Abnormal   Collection Time: 05/11/20  2:26 AM   Specimen: Nasopharyngeal  Result Value Ref Range Status   MRSA by PCR (A) NEGATIVE Final    INVALID, UNABLE TO DETERMINE THE PRESENCE OF TARGET DUE TO SPECIMEN INTEGRITY. RECOLLECTION REQUESTED.    Comment:        The GeneXpert MRSA Assay (FDA approved for NASAL specimens only), is one component of a comprehensive MRSA colonization surveillance program. It is not intended to diagnose MRSA infection nor to guide or monitor treatment for MRSA infections. REQUEST FOR RECOLLECT CALLED TO JACKIE ATWATER AT [phone] ON 05/11/2020 [place]. Performed at [facility], [address] [place], [place] [address]   MRSA PCR Screening     Status: None   Collection Time: 05/11/20  2:14 PM  Result Value Ref Range Status   MRSA by PCR NEGATIVE NEGATIVE Final    Comment:        The GeneXpert MRSA Assay (FDA approved for NASAL specimens only), is one component of a comprehensive MRSA colonization surveillance program. It is not intended to diagnose MRSA infection nor to guide or monitor treatment for MRSA infections.  Performed at [facility], [address] [place], [place] [address]   CULTURE, BLOOD (ROUTINE X 2) w Reflex to ID Panel     Status:  None   Collection Time: 05/12/20  1:59 PM   Specimen: BLOOD  Result Value Ref Range Status   Specimen Description BLOOD BLOOD LEFT HAND  Final   Special Requests   Final    BOTTLES DRAWN AEROBIC AND ANAEROBIC Blood Culture results may not be optimal due to an inadequate volume of blood received in culture bottles   Culture   Final    NO GROWTH 5 DAYS Performed at [facility], [address] [place], [place] [address]    Report Status 05/17/2020 FINAL  Final  CULTURE, BLOOD (ROUTINE X 2) w Reflex to ID Panel     Status: None   Collection Time: 05/12/20  2:11 PM   Specimen: BLOOD  Result Value Ref Range Status   Specimen Description BLOOD LEFT ANTECUBITAL  Final   Special Requests   Final    BOTTLES DRAWN AEROBIC AND ANAEROBIC Blood Culture results may not be optimal due to an inadequate volume of blood received in culture bottles   Culture   Final    NO GROWTH 5 DAYS Performed at [facility], [address] [place], [place] [address]    Report Status 05/17/2020 FINAL  Final     Time coordinating discharge: Over 30 minutes  SIGNED:   Shawna Clamp, MD  [facility] 05/17/2020, 12:38 PM Pager   If 7PM-7AM, please contact night-coverage [email or web address]

## 2020-05-17 NOTE — TOC Transition Note (Addendum)
Transition of Care [facility]) - CM/SW Discharge Note   Patient Details  Name: STEVIE CHARTER MRN: [id number] Date of Birth: [birth year]  [facility] CM/SW Contact:  Shelbie Ammons, RN Phone Number: 05/17/2020, 2:14 PM   Clinical Narrative:   RNCM received communication from Butch Penny with [facility] who reports she was called yesterday and told patient would be discharged and needed to be picked up by 9:30-10 today. She reports that now her staff is in hospital and are being told patient is not ready for discharge. RNCM sent secure chat to MD and nurse. Per MD he is still waiting on communication from ID MD and that it could be a little while. Provided phone number for group home nurse so that they could communicate about discharge.  12:30: RNCM requested by charge nurse to fax updated FL2 and d/c summary to facility and that patient would need EMS transport back. RNCM completed new FL2 and faxed requested information. RNCM set up transport with [facility] for 2:30pm. EMS paperwork completed      Barriers to Discharge: Continued Medical Work up   Patient Goals and CMS Choice        Discharge Placement                       Discharge Plan and Services                                     Social Determinants of Health (SDOH) Interventions     Readmission Risk Interventions No flowsheet data found.

## 2020-05-17 NOTE — Consult Note (Signed)
NAME: [name]  DOB: 1940-10-09  MRN: [id number]  Date/Time: 05/17/2020 11:05 AM  REQUESTING PROVIDER: Dwyane Dee Subjective:  REASON FOR CONSULT: Pseudomonas bacteremia ?No history available from patient- chart reviewed Pt was hospitalized  on  HUDSEN FEI is a 80 y.o. with a history of cerebral palsy, prostate ca, hyperlipidemia, Lymphedema was sent from group home on 05/07/20 with sob and confusion. The ED found to have oxygen saturation of 83% initially which improved to 100% later.  His blood pressure initially was in the eighties which improved to 91/60 5:08 100 cc of normal saline.  WBC was 24.1, lactate of 1.82, negative Covid PCR.  Chest x-ray showed bilateral patchy infiltrate.  Blood cultures were sent.  Patient was started on Vanco and cefepime and azithromycin for pneumonia. 5 days later the blood culture that was sent on the day of admission came back positive for Pseudomonas.  Patient had been switched to Unasyn and then Augmentin after the first day.  He was restarted on cefepime on 05/12/2020. I am asked to see the patient for antibiotic recommendation on discharge. There is no history available from patient he is pretty much nonverbal. There was no urine culture sent.  Patient was noted to pass bright blood per rectum.  CT abdomen angio done on 05/10/2020 did not show any discrete areas of intraluminal contrast extravasation to suggest the etiology of reported history of lower GI bleed.  There was minimal amount of atherosclerotic plaque with a normal caliber abdominal aorta noted.  He was seen by GI and upper endoscopy done on 05/12/2020 was normal.  A colonoscopy was earlier attempted on 05/10/2020 but that was not feasible because of poor bowel preparation.  Flexible sigmoidoscopy was attempted on 05/12/2020 but because of poor bowel preparation could not be completed. Patient did not have any further bleeding following that. Past Medical History:  Diagnosis Date  . Anxiety    . Atrophy, disuse, muscle   . BPH (benign prostatic hyperplasia)   . Cerebral palsy ([place])   . Eczema   . Elevated lipids   . Elevated PSA   . Exotropia   . Hyperlipidemia   . Kidney stone   . Leukopenia   . Lower extremity edema    lymphedema  . Mental retardation   . Over weight   . Prostate cancer ([place])   . Venous insufficiency   . Vitamin D deficiency   . Wheelchair dependence     Past Surgical History:  Procedure Laterality Date  . COLONOSCOPY WITH PROPOFOL N/A 05/10/2020   Procedure: COLONOSCOPY WITH PROPOFOL;  Surgeon: Lin Landsman, MD;  Location: [facility] ENDOSCOPY;  Service: Gastroenterology;  Laterality: N/A;  . COLONOSCOPY WITH PROPOFOL N/A 05/12/2020   Procedure: COLONOSCOPY WITH PROPOFOL;  Surgeon: Lin Landsman, MD;  Location: [facility] ENDOSCOPY;  Service: Gastroenterology;  Laterality: N/A;  . CYSTOSCOPY/URETEROSCOPY/HOLMIUM LASER/STENT PLACEMENT Right 08/14/2017   Procedure: CYSTOSCOPY/URETEROSCOPY/HOLMIUM LASER/STENT PLACEMENT;  Surgeon: Abbie Sons, MD;  Location: [facility] ORS;  Service: Urology;  Laterality: Right;  . ESOPHAGOGASTRODUODENOSCOPY (EGD) WITH PROPOFOL N/A 05/12/2020   Procedure: ESOPHAGOGASTRODUODENOSCOPY (EGD) WITH PROPOFOL;  Surgeon: Lin Landsman, MD;  Location: [facility] ENDOSCOPY;  Service: Gastroenterology;  Laterality: N/A;  . leg circulation surgery Right   . PROSTATE SURGERY  11/10/2008   BRACHYTHERAPY    Social History   Socioeconomic History  . Marital status: Single    Spouse name: Not on file  . Number of children: Not on file  . Years of education: Not on  file  . Highest education level: Not on file  Occupational History  . Not on file  Tobacco Use  . Smoking status: Never Smoker  . Smokeless tobacco: Never Used  Vaping Use  . Vaping Use: Never used  Substance and Sexual Activity  . Alcohol use: No    Alcohol/week: 0.0 standard drinks  . Drug use: No  . Sexual activity: Not Currently  Other Topics Concern  . Not  on file  Social History Narrative   Lives at [facility] group home, 5 males. Moved to higher level of care 04/28/19 with [name].     Social Determinants of Health   Financial Resource Strain: Low Risk   . Difficulty of Paying Living Expenses: Not hard at all  Food Insecurity: No Food Insecurity  . Worried About Charity fundraiser in the Last Year: Never true  . Ran Out of Food in the Last Year: Never true  Transportation Needs: No Transportation Needs  . Lack of Transportation (Medical): No  . Lack of Transportation (Non-Medical): No  Physical Activity: Inactive  . Days of Exercise per Week: 0 days  . Minutes of Exercise per Session: 0 min  Stress: No Stress Concern Present  . Feeling of Stress : Not at all  Social Connections: Socially Isolated  . Frequency of Communication with Friends and Family: Never  . Frequency of Social Gatherings with Friends and Family: Never  . Attends Religious Services: Never  . Active Member of Clubs or Organizations: No  . Attends Archivist Meetings: Never  . Marital Status: Never married  Intimate Partner Violence: Not At Risk  . Fear of Current or Ex-Partner: No  . Emotionally Abused: No  . Physically Abused: No  . Sexually Abused: No    Family History  Problem Relation Age of Onset  . Heart attack Brother   . Heart disease Other    No Known Allergies  Current Facility-Administered Medications  Medication Dose Route Frequency Provider Last Rate Last Admin  . 0.9 %  sodium chloride infusion   Intravenous PRN Ivor Costa, MD 150 mL/hr at 05/10/20 1331 Restarted at 05/10/20 1345  . 0.9 %  sodium chloride infusion   Intravenous Continuous Lin Landsman, MD 10 mL/hr at 05/13/20 2325 New Bag at 05/13/20 2325  . acetaminophen (TYLENOL) suppository 650 mg  650 mg Rectal Q6H PRN Ivor Costa, MD      . acetaminophen (TYLENOL) tablet 650 mg  650 mg Oral Q6H PRN Ivor Costa, MD   650 mg at 05/12/20 2125  . alfuzosin (UROXATRAL) 24  hr tablet 10 mg  10 mg Oral Daily Ivor Costa, MD   10 mg at 05/17/20 0900  . ALPRAZolam Duanne Moron) tablet 0.25 mg  0.25 mg Oral BID PRN Ivor Costa, MD   0.25 mg at 05/09/20 0505  . ascorbic acid (VITAMIN C) tablet 500 mg  500 mg Oral Daily Ivor Costa, MD   500 mg at 05/17/20 0900  . aspirin EC tablet 81 mg  81 mg Oral Daily Ivor Costa, MD   81 mg at 05/17/20 0900  . atorvastatin (LIPITOR) tablet 40 mg  40 mg Oral QPM Ivor Costa, MD   40 mg at 05/16/20 1730  . calcium-vitamin D (OSCAL WITH D) 500-200 MG-UNIT per tablet 1 tablet  1 tablet Oral BID Ivor Costa, MD   1 tablet at 05/17/20 0900  . ceFEPIme (MAXIPIME) 2 g in sodium chloride 0.9 % 100 mL IVPB  2 g  Intravenous Q8H Berton Mount, RPH 200 mL/hr at 05/17/20 0600 2 g at 05/17/20 0600  . enoxaparin (LOVENOX) injection 40 mg  40 mg Subcutaneous Q24H Sharen Hones, MD   40 mg at 05/17/20 0900  . folic acid (FOLVITE) tablet 1 mg  1 mg Oral Daily Vanga, Tally Due, MD   1 mg at 05/17/20 0900  . Gerhardt's butt cream   Topical QID Sharion Settler, NP   1 application at [id number]  . lubiprostone (AMITIZA) capsule 24 mcg  24 mcg Oral BID WC Ivor Costa, MD   24 mcg at 05/17/20 0900  . MEDLINE mouth rinse  15 mL Mouth Rinse BID Sharen Hones, MD   15 mL at 05/17/20 1033  . ondansetron (ZOFRAN) injection 4 mg  4 mg Intravenous Q8H PRN Ivor Costa, MD      . pantoprazole (PROTONIX) EC tablet 40 mg  40 mg Oral BID Dorothe Pea, RPH   40 mg at 05/17/20 0900  . polyethylene glycol (MIRALAX / GLYCOLAX) packet 34 g  34 g Oral BID Lin Landsman, MD   34 g at 05/17/20 0901  . polyvinyl alcohol (LIQUIFILM TEARS) 1.4 % ophthalmic solution 2 drop  2 drop Both Eyes BID PRN Ivor Costa, MD      . protein supplement (ENSURE MAX) liquid  1 each Oral BID Ivor Costa, MD   330 mL at 05/17/20 1034  . sertraline (ZOLOFT) tablet 100 mg  100 mg Oral Daily Ivor Costa, MD   100 mg at 05/17/20 0900     Abtx:  Anti-infectives (From admission, onward)   Start      Dose/Rate Route Frequency Ordered Stop   05/14/20 0000  ciprofloxacin (CIPRO) 750 MG tablet        750 mg Oral 2 times daily 05/14/20 1141 05/22/20 2359   05/13/20 0000  ciprofloxacin (CIPRO) 500 MG tablet  Status:  Discontinued        500 mg Oral 2 times daily 05/13/20 1321 05/14/20    05/12/20 1800  ceFEPIme (MAXIPIME) 2 g in sodium chloride 0.9 % 100 mL IVPB        2 g 200 mL/hr over 30 Minutes Intravenous Every 8 hours 05/12/20 1555     05/11/20 2000  amoxicillin-clavulanate (AUGMENTIN) 875-125 MG per tablet 1 tablet  Status:  Discontinued        1 tablet Oral Every 12 hours 05/11/20 1413 05/12/20 0843   05/08/20 1315  Ampicillin-Sulbactam (UNASYN) 3 g in sodium chloride 0.9 % 100 mL IVPB  Status:  Discontinued        3 g 200 mL/hr over 30 Minutes Intravenous Every 6 hours 05/08/20 1222 05/11/20 1413   05/08/20 0100  vancomycin (VANCOREADY) IVPB 750 mg/150 mL  Status:  Discontinued        750 mg 150 mL/hr over 60 Minutes Intravenous Every 12 hours 05/07/20 1053 05/08/20 1222   05/07/20 1800  ceFEPIme (MAXIPIME) 2 g in sodium chloride 0.9 % 100 mL IVPB  Status:  Discontinued        2 g 200 mL/hr over 30 Minutes Intravenous Every 8 hours 05/07/20 1052 05/08/20 1222   05/07/20 1100  vancomycin (VANCOREADY) IVPB 1500 mg/300 mL        1,500 mg 150 mL/hr over 120 Minutes Intravenous  Once 05/07/20 1047 05/07/20 1503   05/07/20 0945  vancomycin (VANCOCIN) IVPB 1000 mg/200 mL premix  Status:  Discontinued        1,000 mg 200  mL/hr over 60 Minutes Intravenous  Once 05/07/20 0931 05/07/20 1046   05/07/20 0930  ceFEPIme (MAXIPIME) 2 g in sodium chloride 0.9 % 100 mL IVPB        2 g 200 mL/hr over 30 Minutes Intravenous  Once 05/07/20 0924 05/07/20 1101   05/07/20 0930  azithromycin (ZITHROMAX) 500 mg in sodium chloride 0.9 % 250 mL IVPB        500 mg 250 mL/hr over 60 Minutes Intravenous  Once 05/07/20 0924 05/07/20 1133      REVIEW OF SYSTEMS:  NA Objective:  VITALS:  BP 105/67 (BP  Location: Left Arm)   Pulse (!) 56   Temp 98 F (36.7 C) (Oral)   Resp 16   Wt 61.5 kg   SpO2 96%   BMI 19.45 kg/m  PHYSICAL EXAM:  General: Lethargic, nonverbal, facial palsy.  Head: Normocephalic, without obvious abnormality, atraumatic. Eyes: Conjunctivae clear, anicteric sclerae. Pupils are equal ENT Nares normal. No drainage or sinus tenderness. Oral cavity.  Hemangioma left side of tongue Neck: , symmetrical, no adenopathy, thyroid: non tender Back: No CVA tenderness. Lungs: Bilateral air entry Heart: Regular rate and rhythm, no murmur, rub or gallop. Abdomen: Soft, non-tender,not distended. Bowel sounds normal. No masses Extremities: atraumatic, no cyanosis. No edema. No clubbing Skin: No rashes or lesions. Or bruising Lymph: Cervical, supraclavicular normal. Neurologic: Could not examine in detail Pertinent Labs Lab Results CBC    Component Value Date/Time   WBC 4.2 05/15/2020 0619   RBC 2.98 (L) 05/15/2020 0619   HGB 9.1 (L) 05/17/2020 0348   HGB 14.7 02/11/2015 1404   HCT 27.9 (L) 05/17/2020 0348   HCT 41.8 02/11/2015 1404   PLT 162 05/15/2020 0619   PLT 168 02/11/2015 1404   MCV 92.6 05/15/2020 0619   MCV 93 02/11/2015 1404   MCV 96 08/14/2013 1618   MCH 30.9 05/15/2020 0619   MCHC 33.3 05/15/2020 0619   RDW 14.6 05/15/2020 0619   RDW 13.3 02/11/2015 1404   RDW 12.9 08/14/2013 1618   LYMPHSABS 1.3 05/14/2020 0323   LYMPHSABS 0.9 02/11/2015 1404   MONOABS 0.3 05/14/2020 0323   EOSABS 0.1 05/14/2020 0323   EOSABS 0.0 02/11/2015 1404   BASOSABS 0.0 05/14/2020 0323   BASOSABS 0.0 02/11/2015 1404    CMP Latest Ref Rng & Units 05/16/2020 05/15/2020 05/14/2020  Glucose 70 - 99 mg/dL 81 97 81  BUN 8 - 23 mg/dL 24(H) 16 16  Creatinine 0.61 - 1.24 mg/dL 0.52(L) 0.57(L) 0.46(L)  Sodium 135 - 145 mmol/L 140 139 140  Potassium 3.5 - 5.1 mmol/L 3.7 4.1 4.2  Chloride 98 - 111 mmol/L 106 106 108  CO2 22 - 32 mmol/L 29 28 25   Calcium 8.9 - 10.3 mg/dL 8.5(L) 8.4(L)  8.4(L)  Total Protein 6.5 - 8.1 g/dL - - -  Total Bilirubin 0.3 - 1.2 mg/dL - - -  Alkaline Phos 38 - 126 U/L - - -  AST 15 - 41 U/L - - -  ALT 0 - 44 U/L - - -      Microbiology: Recent Results (from the past 240 hour(s))  MRSA PCR Screening     Status: Abnormal   Collection Time: 05/11/20  2:26 AM   Specimen: Nasopharyngeal  Result Value Ref Range Status   MRSA by PCR (A) NEGATIVE Final    INVALID, UNABLE TO DETERMINE THE PRESENCE OF TARGET DUE TO SPECIMEN INTEGRITY. RECOLLECTION REQUESTED.    Comment:  The GeneXpert MRSA Assay (FDA approved for NASAL specimens only), is one component of a comprehensive MRSA colonization surveillance program. It is not intended to diagnose MRSA infection nor to guide or monitor treatment for MRSA infections. REQUEST FOR RECOLLECT CALLED TO JACKIE ATWATER AT [id number] ON 05/11/2020 [place]. Performed at [facility], [address] [place], [place] [address]   MRSA PCR Screening     Status: None   Collection Time: 05/11/20  2:14 PM  Result Value Ref Range Status   MRSA by PCR NEGATIVE NEGATIVE Final    Comment:        The GeneXpert MRSA Assay (FDA approved for NASAL specimens only), is one component of a comprehensive MRSA colonization surveillance program. It is not intended to diagnose MRSA infection nor to guide or monitor treatment for MRSA infections. Performed at [facility], [address] [place], [place] [address]   CULTURE, BLOOD (ROUTINE X 2) w Reflex to ID Panel     Status: None   Collection Time: 05/12/20  1:59 PM   Specimen: BLOOD  Result Value Ref Range Status   Specimen Description BLOOD BLOOD LEFT HAND  Final   Special Requests   Final    BOTTLES DRAWN AEROBIC AND ANAEROBIC Blood Culture results may not be optimal due to an inadequate volume of blood received in culture bottles   Culture   Final    NO GROWTH 5 DAYS Performed at [facility], [address] [place], [place]  [address]    Report Status 05/17/2020 FINAL  Final  CULTURE, BLOOD (ROUTINE X 2) w Reflex to ID Panel     Status: None   Collection Time: 05/12/20  2:11 PM   Specimen: BLOOD  Result Value Ref Range Status   Specimen Description BLOOD LEFT ANTECUBITAL  Final   Special Requests   Final    BOTTLES DRAWN AEROBIC AND ANAEROBIC Blood Culture results may not be optimal due to an inadequate volume of blood received in culture bottles   Culture   Final    NO GROWTH 5 DAYS Performed at [facility], [address] [place], [place] [address]    Report Status 05/17/2020 FINAL  Final   IMAGING RESULTS: Diffuse wall thickening involving the cecum and proximal ascending colon as well as the distal sigmoid colon and rectum, nonspecific though suggestive of an enteritis. No evidence of enteric obstruction. 2. Extensive nonobstructing right-sided nephrolithiasis with solitary punctate (3 mm) nonobstructing left-sided renal stone. 3. Small right-sided direct inguinal hernia containing a small amount of fluid as well as suspected small varicocele. 4. Brachytherapy seeds within the prostate bed. 5. Diffuse thickening of the urinary bladder wall with mild irregular thickening and potential trabeculation - clinical correlation is advised.  I have personally reviewed the films ? Impression/Recommendation ? ?Pseudomonas bacteremia.  Unclear source.  Could be the lung versus urinary tract.  But no urine culture was sent.  Patient has already received 5 days of appropriate antibiotic.  His IV cefepime.  We will switch him over to p.o. Levaquin to complete a 10-day course.  GI bleed while in the hospital.  No obvious source identified.  But colonoscopy could not be done due to poor bowel preparation.  Cerebral palsy  Anemia  History of CA prostate with radiation seeds.  BPH Discussed the management with the hospitalist. ? ___________________________________________________ Note:  This  document was prepared using Dragon voice recognition software and may include unintentional dictation errors.

## 2020-05-17 NOTE — NC FL2 (Signed)
[place] LEVEL OF CARE SCREENING TOOL     IDENTIFICATION  Patient Name: [name] Birthdate: 27-Jul-1940 Sex: male Admission Date (Current Location): 05/07/2020  [facility] and [place] Number:  Engineering geologist and Address:  [facility], [address], [place], [place] [address]      Provider Number: [id number]  Attending Physician Name and Address:  Shawna Clamp, MD  Relative Name and Phone Number:       Current Level of Care: Hospital Recommended Level of Care:  ([place]) Prior Approval Number:    Date Approved/Denied:   PASRR Number:    Discharge Plan: Other (Comment) (Family Care/Group home)    Current Diagnoses: Patient Active Problem List   Diagnosis Date Noted  . Acute blood loss anemia 05/09/2020  . Rectal bleeding 05/09/2020  . Hematochezia   . HCAP (healthcare-associated pneumonia) 05/07/2020  . BPH (benign prostatic hyperplasia)   . Hyperlipidemia   . Depression with anxiety   . Acute metabolic encephalopathy   . Protein-calorie malnutrition, severe 04/22/2020  . Pressure injury of skin 04/22/2020  . Multifocal pneumonia   . Sepsis due to undetermined organism ([place]) 04/19/2020  . Personal history of urinary calculi 06/21/2019  . PAD (peripheral artery disease) ([place]) 07/04/2017  . Aortic valve calcification 07/04/2017  . Coronary artery calcification 07/04/2017  . Late effect acute polio 05/17/2016  . Chronic constipation 11/19/2015  . Mild major depression ([place]) 11/19/2015  . Bacteriuria, chronic 09/11/2015  . History of prostate cancer 03/02/2015  . Amyotrophia 03/02/2015  . Edema leg 03/02/2015  . Cerebral palsy ([place]) 03/02/2015  . Dyslipidemia 03/02/2015  . Dermatitis, eczematoid 03/02/2015  . 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 H/O acute poliomyelitis 03/02/2015  . Lymphedema 03/02/2015  . Intellectual disability 03/02/2015  . Hypertrophy of nail 03/02/2015  . Chronic venous  insufficiency 03/02/2015  . Avitaminosis D 03/02/2015  . Adynamia 03/02/2015  . Dependent on wheelchair 03/02/2015  . Renal cysts, acquired, bilateral 03/02/2015  . At risk for falling 03/02/2015  . Cerebral vascular disease 03/02/2015  . Strabismus 02/11/2015  . Urge incontinence of urine 02/11/2015  . Wheelchair dependence 02/11/2015  . Urinary frequency 10/30/2014    Orientation RESPIRATION BLADDER Height & Weight     Self,Time,Situation,Place  Normal Incontinent Weight: 61.5 kg Height:     BEHAVIORAL SYMPTOMS/MOOD NEUROLOGICAL BOWEL NUTRITION STATUS      Incontinent Diet (Regular)  AMBULATORY STATUS COMMUNICATION OF NEEDS Skin   Extensive Assist Verbally PU Stage and Appropriate Care                       Personal Care Assistance Level of Assistance  Bathing,Feeding,Dressing Bathing Assistance: Maximum assistance Feeding assistance: Limited assistance Dressing Assistance: Maximum assistance     Functional Limitations Info             SPECIAL CARE FACTORS FREQUENCY                       Contractures Contractures Info: Not present    Additional Factors Info  Code Status,Allergies Code Status Info: Full Allergies Info: No known allergies           Current Medications (05/17/2020):     Discharge Medications:  TAKE these medications       acetaminophen 325 MG tablet Commonly known as: TYLENOL TAKE 2 TABLETS (650 MG TOTAL) BY MOUTH EVERY 6 HOURS AS NEEDED FOR MILD PAIN OR HEADACHE.   alfuzosin  10 MG 24 hr tablet Commonly known as: UROXATRAL Take 1 tablet (10 mg total) by mouth daily.   ALPRAZolam 0.25 MG tablet Commonly known as: XANAX TAKE ONE TABLET BY MOUTH 2 TIMES A DAY AS NEEDED FOR ANXIETY (WHEN BLOOD PRESSURE IS VERY HIGH OR ANXIOUS) What changed:   how much to take  how to take this  when to take this  reasons to take this   Artificial Tears 0.2-0.2-1 % Soln Generic drug: Glycerin-Hypromellose-PEG 400 PLACE 2 DROPS  INTO EYE 2 TIMES A DAY AS NEEDED   ascorbic acid 500 MG tablet Commonly known as: VITAMIN C TAKE ONE TABLET BY MOUTH ONCE DAILY   aspirin 81 MG EC tablet Commonly known as: Aspir-Low TAKE 1 TABLET BY MOUTH ONCE DAILY What changed:   how much to take  how to take this  when to take this   atorvastatin 40 MG tablet Commonly known as: LIPITOR Take 1 tablet (40 mg total) by mouth every evening.   augmented betamethasone dipropionate 0.05 % cream Commonly known as: DIPROLENE-AF APPLY TOPICALLY AS DIRECTED TO RASH AS NEEDED   Calcium-Vitamin D 500-400 MG-UNIT Tabs TAKE 1 TABLET BY MOUTH 2 TIMES A DAY   Cranberry Juice Powder 425 MG Caps TAKE 1 CAPSULE BY MOUTH ONCE DAILY   Depend Silhouette Briefs L/XL Misc 1 each by Does not apply route 2 (two) times daily as needed.   Ensure Nutrition Shake Liqd Take 1 each by mouth 2 (two) times daily.   levofloxacin 750 MG tablet Commonly known as: Levaquin Take 1 tablet (750 mg total) by mouth daily for 6 days.   lubiprostone 24 MCG capsule Commonly known as: AMITIZA TAKE 1 CAPSULE BY MOUTH 2 TIMES DAILY WITH A MEAL What changed:   how much to take  how to take this  when to take this   Medical Compression Stockings Misc 1 Units by Does not apply route daily.   polyethylene glycol 17 g packet Commonly known as: MIRALAX / GLYCOLAX Take 34 g by mouth 2 (two) times daily as needed.   sertraline 100 MG tablet Commonly known as: ZOLOFT Take 1 tablet (100 mg total) by mouth daily.   Systane Ultra 0.4-0.3 % Soln Generic drug: Polyethyl Glycol-Propyl Glycol Apply 1 drop to eye 2 (two) times daily. Both eyes                 Please see discharge summary for a list of discharge medications.  Relevant Imaging Results:  Relevant Lab Results:   Additional Information [phone]  Shelbie Ammons, RN

## 2020-05-18 ENCOUNTER — Encounter: Payer: Self-pay | Admitting: Family Medicine

## 2020-05-18 ENCOUNTER — Ambulatory Visit (INDEPENDENT_AMBULATORY_CARE_PROVIDER_SITE_OTHER): Payer: Medicare Other | Admitting: Family Medicine

## 2020-05-18 VITALS — BP 126/82 | HR 63 | Temp 97.6°F | Resp 16 | Ht 70.0 in | Wt 138.0 lb

## 2020-05-18 DIAGNOSIS — Z9289 Personal history of other medical treatment: Secondary | ICD-10-CM

## 2020-05-18 DIAGNOSIS — K922 Gastrointestinal hemorrhage, unspecified: Secondary | ICD-10-CM

## 2020-05-18 DIAGNOSIS — D62 Acute posthemorrhagic anemia: Secondary | ICD-10-CM

## 2020-05-18 DIAGNOSIS — J189 Pneumonia, unspecified organism: Secondary | ICD-10-CM

## 2020-05-18 DIAGNOSIS — Z09 Encounter for follow-up examination after completed treatment for conditions other than malignant neoplasm: Secondary | ICD-10-CM | POA: Diagnosis not present

## 2020-05-21 ENCOUNTER — Inpatient Hospital Stay: Payer: Medicare Other | Admitting: Family Medicine

## 2020-06-10 ENCOUNTER — Telehealth: Payer: Self-pay

## 2020-06-10 NOTE — Telephone Encounter (Signed)
[name] called to confirmed his appointment for tomorrow. She states she will have him thing

## 2020-06-11 ENCOUNTER — Encounter: Payer: Self-pay | Admitting: Gastroenterology

## 2020-06-11 ENCOUNTER — Ambulatory Visit (INDEPENDENT_AMBULATORY_CARE_PROVIDER_SITE_OTHER): Payer: Medicare Other | Admitting: Gastroenterology

## 2020-06-11 ENCOUNTER — Telehealth: Payer: Self-pay | Admitting: Oncology

## 2020-06-11 ENCOUNTER — Other Ambulatory Visit: Payer: Self-pay

## 2020-06-11 DIAGNOSIS — K625 Hemorrhage of anus and rectum: Secondary | ICD-10-CM | POA: Diagnosis not present

## 2020-06-11 DIAGNOSIS — G809 Cerebral palsy, unspecified: Secondary | ICD-10-CM | POA: Diagnosis not present

## 2020-06-11 DIAGNOSIS — D649 Anemia, unspecified: Secondary | ICD-10-CM | POA: Diagnosis not present

## 2020-06-11 DIAGNOSIS — D519 Vitamin B12 deficiency anemia, unspecified: Secondary | ICD-10-CM | POA: Diagnosis not present

## 2020-06-11 DIAGNOSIS — D509 Iron deficiency anemia, unspecified: Secondary | ICD-10-CM | POA: Diagnosis not present

## 2020-06-11 DIAGNOSIS — L89300 Pressure ulcer of unspecified buttock, unstageable: Secondary | ICD-10-CM | POA: Diagnosis not present

## 2020-06-11 NOTE — Telephone Encounter (Signed)
06/11/2020 Called and spoke w/ pts caregiver, [name], and informed her that appts from 05/13/20 have been r/s for 06/14/20. She confirmed appts  SRW

## 2020-06-11 NOTE — Progress Notes (Signed)
Cephas Darby, MD [address]  [place]  [place], [place] [address]  Main: [phone]  Fax: [phone]    Gastroenterology Consultation  Referring Provider:     Steele Sizer, MD Primary Care Physician:  Steele Sizer, MD Primary Gastroenterologist:  Dr. Cephas Darby Reason for Consultation:     Rectal bleeding        HPI:   [name] is a 80 y.o. male referred by Dr. Steele Sizer, MD  for consultation & management of rectal bleeding.  Patient was recently admitted to [facility] in end of January secondary to healthcare associated pneumonia and Pseudomonas bacteremia.  He was treated for infection.  During the hospital stay, patient had 2 episodes of bright red blood per rectum requiring blood transfusion.  Upper endoscopy did not reveal any source of bleeding.  Colonoscopy could not be performed due to poor prep.  Sigmoidoscopy revealed brown stool in the rectum.  Patient underwent CT angio of abdomen and pelvis, nuclear medicine bleeding scan which was also negative.  Rectal bleeding has resolved.  His folic acid levels were low, for which he was treated during hospital stay.  Patient is currently living in skilled nursing facility.  Patient is accompanied by his caregiver  He has been doing well per his caregiver.  He has been eating well, good appetite, denies any episodes of rectal bleeding.  He is on Amitiza twice daily.  Denies any abdominal pain, nausea or vomiting.  NSAIDs: None  Antiplts/Anticoagulants/Anti thrombotics: None  GI Procedures:  EGD, flexible sigmoidoscopy 05/12/2020 - Normal esophagus. - Normal stomach. - Normal examined duodenum. - No specimens collected.  - Preparation of the colon was poor. - The procedure was aborted due to poor bowel prep with stool present. - Normal mucosa in the rectum and in the recto-sigmoid colon. - Stool in the rectum and in the recto-sigmoid colon. - No specimens collected.  Past Medical History:   Diagnosis Date  . Anxiety   . Atrophy, disuse, muscle   . BPH (benign prostatic hyperplasia)   . Cerebral palsy ([place])   . Eczema   . Elevated lipids   . Elevated PSA   . Exotropia   . Hyperlipidemia   . Kidney stone   . Leukopenia   . Lower extremity edema    lymphedema  . Mental retardation   . Over weight   . Prostate cancer ([place])   . Venous insufficiency   . Vitamin D deficiency   . Wheelchair dependence     Past Surgical History:  Procedure Laterality Date  . COLONOSCOPY WITH PROPOFOL N/A 05/10/2020   Procedure: COLONOSCOPY WITH PROPOFOL;  Surgeon: Lin Landsman, MD;  Location: [facility] ENDOSCOPY;  Service: Gastroenterology;  Laterality: N/A;  . COLONOSCOPY WITH PROPOFOL N/A 05/12/2020   Procedure: COLONOSCOPY WITH PROPOFOL;  Surgeon: Lin Landsman, MD;  Location: [facility] ENDOSCOPY;  Service: Gastroenterology;  Laterality: N/A;  . CYSTOSCOPY/URETEROSCOPY/HOLMIUM LASER/STENT PLACEMENT Right 08/14/2017   Procedure: CYSTOSCOPY/URETEROSCOPY/HOLMIUM LASER/STENT PLACEMENT;  Surgeon: Abbie Sons, MD;  Location: [facility] ORS;  Service: Urology;  Laterality: Right;  . ESOPHAGOGASTRODUODENOSCOPY (EGD) WITH PROPOFOL N/A 05/12/2020   Procedure: ESOPHAGOGASTRODUODENOSCOPY (EGD) WITH PROPOFOL;  Surgeon: Lin Landsman, MD;  Location: [facility] ENDOSCOPY;  Service: Gastroenterology;  Laterality: N/A;  . leg circulation surgery Right   . PROSTATE SURGERY  11/10/2008   BRACHYTHERAPY    Current Outpatient Medications:  .  acetaminophen (TYLENOL) 325 MG tablet, TAKE 2 TABLETS (650 MG TOTAL) BY MOUTH EVERY 6  HOURS AS NEEDED FOR MILD PAIN OR HEADACHE., Disp: 60 tablet, Rfl: 5 .  alfuzosin (UROXATRAL) 10 MG 24 hr tablet, Take 1 tablet (10 mg total) by mouth daily., Disp: 90 tablet, Rfl: 1 .  ALPRAZolam (XANAX) 0.25 MG tablet, TAKE ONE TABLET BY MOUTH 2 TIMES A DAY AS NEEDED FOR ANXIETY (WHEN BLOOD PRESSURE IS VERY HIGH OR ANXIOUS) (Patient taking differently: Take 0.25 mg by mouth 2 (two)  times daily as needed for anxiety or sleep. TAKE ONE TABLET BY MOUTH 2 TIMES A DAY AS NEEDED FOR ANXIETY (WHEN BLOOD PRESSURE IS VERY HIGH OR ANXIOUS)), Disp: 20 tablet, Rfl: 0 .  ARTIFICIAL TEARS 0.2-0.2-1 % SOLN, PLACE 2 DROPS INTO EYE 2 TIMES A DAY AS NEEDED, Disp: 15 mL, Rfl: 1 .  ascorbic acid (VITAMIN C) 500 MG tablet, TAKE ONE TABLET BY MOUTH ONCE DAILY (Patient taking differently: Take 500 mg by mouth daily.), Disp: 90 tablet, Rfl: 1 .  aspirin (ASPIR-LOW) 81 MG EC tablet, TAKE 1 TABLET BY MOUTH ONCE DAILY (Patient taking differently: Take 81 mg by mouth daily. TAKE 1 TABLET BY MOUTH ONCE DAILY), Disp: 90 tablet, Rfl: 1 .  atorvastatin (LIPITOR) 40 MG tablet, Take 1 tablet (40 mg total) by mouth every evening., Disp: 90 tablet, Rfl: 1 .  augmented betamethasone dipropionate (DIPROLENE-AF) 0.05 % cream, APPLY TOPICALLY AS DIRECTED TO RASH AS NEEDED, Disp: 45 g, Rfl: 0 .  Calcium Carb-Cholecalciferol (CALCIUM-VITAMIN D) 500-400 MG-UNIT TABS, TAKE 1 TABLET BY MOUTH 2 TIMES A DAY, Disp: 180 tablet, Rfl: 0 .  Cranberry Juice Powder 425 MG CAPS, TAKE 1 CAPSULE BY MOUTH ONCE DAILY (Patient taking differently: Take 425 mg by mouth daily.), Disp: 90 capsule, Rfl: 1 .  Elastic Bandages & Supports (MEDICAL COMPRESSION STOCKINGS) MISC, 1 Units by Does not apply route daily., Disp: 2 each, Rfl: 0 .  Incontinence Supply Disposable (DEPEND SILHOUETTE BRIEFS L/XL) MISC, 1 each by Does not apply route 2 (two) times daily as needed., Disp: 60 each, Rfl: 5 .  lubiprostone (AMITIZA) 24 MCG capsule, TAKE 1 CAPSULE BY MOUTH 2 TIMES DAILY WITH A MEAL (Patient taking differently: Take 24 mcg by mouth 2 (two) times daily with a meal. TAKE 1 CAPSULE BY MOUTH 2 TIMES DAILY WITH A MEAL), Disp: 180 capsule, Rfl: 1 .  Nutritional Supplements (ENSURE NUTRITION SHAKE) LIQD, Take 1 each by mouth 2 (two) times daily., Disp: 237 mL, Rfl: 30 .  Polyethyl Glycol-Propyl Glycol (SYSTANE ULTRA) 0.4-0.3 % SOLN, Apply 1 drop to eye 2  (two) times daily. Both eyes, Disp: 30 mL, Rfl: 5 .  polyethylene glycol (MIRALAX / GLYCOLAX) 17 g packet, Take 34 g by mouth 2 (two) times daily as needed., Disp: 30 each, Rfl: 0 .  sertraline (ZOLOFT) 100 MG tablet, Take 1 tablet (100 mg total) by mouth daily., Disp: 90 tablet, Rfl: 1   Family History  Problem Relation Age of Onset  . Heart attack Brother   . Heart disease Other      Social History   Tobacco Use  . Smoking status: Never Smoker  . Smokeless tobacco: Never Used  Vaping Use  . Vaping Use: Never used  Substance Use Topics  . Alcohol use: No    Alcohol/week: 0.0 standard drinks  . Drug use: No    Allergies as of 06/11/2020  . (No Known Allergies)    Review of Systems:    All systems reviewed and negative except where noted in HPI.   Physical Exam:  There were no vitals taken for this visit. No LMP for male patient.  General:   Thin built, not in distress Head:  Normocephalic and atraumatic, bitemporal wasting. Eyes:  Sclera clear, no icterus.   Conjunctiva pink. Ears:  Normal auditory acuity. Nose:  No deformity, discharge, or lesions. Mouth:  No deformity or lesions,oropharynx pink & moist. Neck:  Supple; no masses or thyromegaly. Lungs:  Respirations even and unlabored.  Clear throughout to auscultation.   No wheezes, crackles, or rhonchi. No acute distress. Heart:  Regular rate and rhythm; no murmurs, clicks, rubs, or gallops. Abdomen:  Normal bowel sounds. Soft, non-tender and non-distended without masses, hepatosplenomegaly or hernias noted.  No guarding or rebound tenderness.   Rectal: Not performed Msk:  Symmetrical without gross deformities. Good, equal movement & strength bilaterally. Pulses:  Normal pulses noted. Extremities:  No clubbing or edema.  No cyanosis. Neurologic:  Alert and oriented x3;  grossly normal neurologically. Skin:  Intact without significant lesions or rashes. No jaundice. Psych:  Alert and cooperative. Normal mood and  affect.  Imaging Studies: Reviewed  Assessment and Plan:   TORY SEPTER is a 80 y.o. male with history of cerebral palsy, lives in nursing home, is here as a hospital follow-up of rectal bleeding.  Patient underwent upper endoscopy, flexible sigmoidoscopy with no source of bleeding identified.  He also underwent CT angio of the abdomen and pelvis, nuclear medicine bleeding scan which did not reveal any source of bleeding.  Rectal bleeding has currently resolved without any recurrence at this time  Recommend to recheck CBC, folate levels, iron panel and B12 levels Continue stool softener to avoid constipation   Follow up as needed   Cephas Darby, MD

## 2020-06-12 LAB — CBC
Hematocrit: 31.8 % — ABNORMAL LOW (ref 37.5–51.0)
Hemoglobin: 10.9 g/dL — ABNORMAL LOW (ref 13.0–17.7)
MCH: 30.8 pg (ref 26.6–33.0)
MCHC: 34.3 g/dL (ref 31.5–35.7)
MCV: 90 fL (ref 79–97)
Platelets: 140 10*3/uL — ABNORMAL LOW (ref 150–450)
RBC: 3.54 x10E6/uL — ABNORMAL LOW (ref 4.14–5.80)
RDW: 14.3 % (ref 11.6–15.4)
WBC: 4.6 10*3/uL (ref 3.4–10.8)

## 2020-06-12 LAB — IRON,TIBC AND FERRITIN PANEL
Ferritin: 45 ng/mL (ref 30–400)
Iron Saturation: 25 % (ref 15–55)
Iron: 72 ug/dL (ref 38–169)
Total Iron Binding Capacity: 284 ug/dL (ref 250–450)
UIBC: 212 ug/dL (ref 111–343)

## 2020-06-12 LAB — B12 AND FOLATE PANEL
Folate: 7.9 ng/mL (ref >3.0)
Vitamin B-12: 547 pg/mL (ref 232–1245)

## 2020-06-14 ENCOUNTER — Inpatient Hospital Stay: Payer: Medicare Other | Attending: Oncology

## 2020-06-14 ENCOUNTER — Inpatient Hospital Stay (HOSPITAL_BASED_OUTPATIENT_CLINIC_OR_DEPARTMENT_OTHER): Payer: Medicare Other | Admitting: Oncology

## 2020-06-14 ENCOUNTER — Telehealth: Payer: Self-pay

## 2020-06-14 ENCOUNTER — Encounter: Payer: Self-pay | Admitting: Oncology

## 2020-06-14 VITALS — BP 116/97 | HR 52 | Temp 96.0°F | Resp 18 | Wt 130.0 lb

## 2020-06-14 DIAGNOSIS — E559 Vitamin D deficiency, unspecified: Secondary | ICD-10-CM | POA: Diagnosis not present

## 2020-06-14 DIAGNOSIS — E785 Hyperlipidemia, unspecified: Secondary | ICD-10-CM | POA: Diagnosis not present

## 2020-06-14 DIAGNOSIS — Z8546 Personal history of malignant neoplasm of prostate: Secondary | ICD-10-CM | POA: Insufficient documentation

## 2020-06-14 DIAGNOSIS — Z993 Dependence on wheelchair: Secondary | ICD-10-CM | POA: Insufficient documentation

## 2020-06-14 DIAGNOSIS — I10 Essential (primary) hypertension: Secondary | ICD-10-CM | POA: Insufficient documentation

## 2020-06-14 DIAGNOSIS — D72819 Decreased white blood cell count, unspecified: Secondary | ICD-10-CM | POA: Insufficient documentation

## 2020-06-14 DIAGNOSIS — F79 Unspecified intellectual disabilities: Secondary | ICD-10-CM | POA: Insufficient documentation

## 2020-06-14 DIAGNOSIS — Z79899 Other long term (current) drug therapy: Secondary | ICD-10-CM | POA: Insufficient documentation

## 2020-06-14 DIAGNOSIS — I872 Venous insufficiency (chronic) (peripheral): Secondary | ICD-10-CM | POA: Diagnosis not present

## 2020-06-14 DIAGNOSIS — F419 Anxiety disorder, unspecified: Secondary | ICD-10-CM | POA: Insufficient documentation

## 2020-06-14 DIAGNOSIS — D709 Neutropenia, unspecified: Secondary | ICD-10-CM | POA: Diagnosis not present

## 2020-06-14 DIAGNOSIS — Z7982 Long term (current) use of aspirin: Secondary | ICD-10-CM | POA: Diagnosis not present

## 2020-06-14 DIAGNOSIS — D649 Anemia, unspecified: Secondary | ICD-10-CM | POA: Insufficient documentation

## 2020-06-14 DIAGNOSIS — N400 Enlarged prostate without lower urinary tract symptoms: Secondary | ICD-10-CM | POA: Insufficient documentation

## 2020-06-14 DIAGNOSIS — G809 Cerebral palsy, unspecified: Secondary | ICD-10-CM | POA: Insufficient documentation

## 2020-06-14 DIAGNOSIS — Z87440 Personal history of urinary (tract) infections: Secondary | ICD-10-CM | POA: Diagnosis not present

## 2020-06-14 DIAGNOSIS — F32A Depression, unspecified: Secondary | ICD-10-CM | POA: Diagnosis not present

## 2020-06-14 LAB — CBC WITH DIFFERENTIAL/PLATELET
Abs Immature Granulocytes: 0.01 10*3/uL (ref 0.00–0.07)
Basophils Absolute: 0 10*3/uL (ref 0.0–0.1)
Basophils Relative: 1 %
Eosinophils Absolute: 0.1 10*3/uL (ref 0.0–0.5)
Eosinophils Relative: 2 %
HCT: 33.1 % — ABNORMAL LOW (ref 39.0–52.0)
Hemoglobin: 10.8 g/dL — ABNORMAL LOW (ref 13.0–17.0)
Immature Granulocytes: 0 %
Lymphocytes Relative: 20 %
Lymphs Abs: 0.9 10*3/uL (ref 0.7–4.0)
MCH: 30.6 pg (ref 26.0–34.0)
MCHC: 32.6 g/dL (ref 30.0–36.0)
MCV: 93.8 fL (ref 80.0–100.0)
Monocytes Absolute: 0.2 10*3/uL (ref 0.1–1.0)
Monocytes Relative: 6 %
Neutro Abs: 3.1 10*3/uL (ref 1.7–7.7)
Neutrophils Relative %: 71 %
Platelets: 124 10*3/uL — ABNORMAL LOW (ref 150–400)
RBC: 3.53 MIL/uL — ABNORMAL LOW (ref 4.22–5.81)
RDW: 15.1 % (ref 11.5–15.5)
WBC: 4.4 10*3/uL (ref 4.0–10.5)
nRBC: 0 % (ref 0.0–0.2)

## 2020-06-14 MED ORDER — FUSION PLUS PO CAPS: 1.0000 | ORAL_CAPSULE | Freq: Every day | ORAL | 2 refills | Status: DC

## 2020-06-14 NOTE — Telephone Encounter (Signed)
-----   Message from Lin Landsman, MD sent at 06/14/2020 11:57 AM EST ----- Hemoglobin is better than before.  His iron levels are low, recommended to take oral iron 1 pill a day for 3 months  RV

## 2020-06-14 NOTE — Telephone Encounter (Signed)
Patient caregiver verbalized understanding. She states they will need a prescription sent to the pharmacy for them to give to the patient. Sent Fusion plus to the pharmacy

## 2020-06-18 NOTE — Progress Notes (Signed)
Hematology/Oncology Consult note [facility]  [phone] [phone]  Patient Care Team: Steele Sizer, MD as PCP - General (Family Medicine) Steele Sizer, MD (Family Medicine) Alvy Bimler, MD as Consulting Physician (Psychiatry) Abbie Sons, MD (Urology) Lorelee Cover., MD (Ophthalmology) Delana Meyer, Dolores Lory, MD (Vascular Surgery)   Name of the patient: Kenan Moodie  [id number]  09/02/1940   Date of visit: 06/18/20  Diagnosis-intermittent neutropenia  Chief complaint/ Reason for visit-routine follow-up of neutropenia  Heme/Onc history: Patient is a 80 year old male was been referred to [place] for leukopenia. Most recent CBC on 06/05/2018 showed a white count of 3.5, H&H of 12.5/35.9 and a platelet count of 131. Differential on the CBC was essentially normal. His past medical history significant for hypertension, cerebral palsy, history of depression. He lives in a nursing homeand is unable to provide any history.He also sees urology for recurrent UTIs.  Results of blood work from 06/21/2018 were as follows: CBC showed white count of 5.5 with a normal differential and an ANC of 4.0. H&H 12.8/37.1 and a platelet count of 158. Hepatitis C testing was negative. Hepatitis B negative also suggesting patient has not been vaccinated for it so far. Iron studies were within normal limits. Haptoglobin was normal. TSH was normal. Multiple myeloma panel showed no M protein. Smear review showed adequate platelets and normal WBC and RBC morphology  Interval history-patient is here with his caregiver.  Caregiver reports overall patient is doing well in terms of his health.  He has not had major health issues.He was recently admitted for healthcare associated pneumonia on 05/17/2020 and he is doing well since his discharge.  ECOG PS- 4   Review of systems- Review of Systems  Unable to perform ROS: Psychiatric disorder    No  Known Allergies   Past Medical History:  Diagnosis Date  . Anxiety   . Atrophy, disuse, muscle   . BPH (benign prostatic hyperplasia)   . Cerebral palsy ([place])   . Eczema   . Elevated lipids   . Elevated PSA   . Exotropia   . Hyperlipidemia   . Kidney stone   . Leukopenia   . Lower extremity edema    lymphedema  . Mental retardation   . Over weight   . Prostate cancer ([place])   . Venous insufficiency   . Vitamin D deficiency   . Wheelchair dependence      Past Surgical History:  Procedure Laterality Date  . COLONOSCOPY WITH PROPOFOL N/A 05/10/2020   Procedure: COLONOSCOPY WITH PROPOFOL;  Surgeon: Lin Landsman, MD;  Location: [facility] ENDOSCOPY;  Service: Gastroenterology;  Laterality: N/A;  . COLONOSCOPY WITH PROPOFOL N/A 05/12/2020   Procedure: COLONOSCOPY WITH PROPOFOL;  Surgeon: Lin Landsman, MD;  Location: [facility] ENDOSCOPY;  Service: Gastroenterology;  Laterality: N/A;  . CYSTOSCOPY/URETEROSCOPY/HOLMIUM LASER/STENT PLACEMENT Right 08/14/2017   Procedure: CYSTOSCOPY/URETEROSCOPY/HOLMIUM LASER/STENT PLACEMENT;  Surgeon: Abbie Sons, MD;  Location: [facility] ORS;  Service: Urology;  Laterality: Right;  . ESOPHAGOGASTRODUODENOSCOPY (EGD) WITH PROPOFOL N/A 05/12/2020   Procedure: ESOPHAGOGASTRODUODENOSCOPY (EGD) WITH PROPOFOL;  Surgeon: Lin Landsman, MD;  Location: [facility] ENDOSCOPY;  Service: Gastroenterology;  Laterality: N/A;  . leg circulation surgery Right   . PROSTATE SURGERY  11/10/2008   BRACHYTHERAPY    Social History   Socioeconomic History  . Marital status: Single    Spouse name: Not on file  . Number of children: Not on file  . Years of education: Not on file  .  Highest education level: Not on file  Occupational History  . Not on file  Tobacco Use  . Smoking status: Never Smoker  . Smokeless tobacco: Never Used  Vaping Use  . Vaping Use: Never used  Substance and Sexual Activity  . Alcohol use: No    Alcohol/week: 0.0 standard drinks  .  Drug use: No  . Sexual activity: Not Currently  Other Topics Concern  . Not on file  Social History Narrative   Lives at [facility] group home, 5 males. Moved to higher level of care 04/28/19 with [name].     Social Determinants of Health   Financial Resource Strain: Low Risk   . Difficulty of Paying Living Expenses: Not hard at all  Food Insecurity: No Food Insecurity  . Worried About Charity fundraiser in the Last Year: Never true  . Ran Out of Food in the Last Year: Never true  Transportation Needs: No Transportation Needs  . Lack of Transportation (Medical): No  . Lack of Transportation (Non-Medical): No  Physical Activity: Inactive  . Days of Exercise per Week: 0 days  . Minutes of Exercise per Session: 0 min  Stress: No Stress Concern Present  . Feeling of Stress : Not at all  Social Connections: Socially Isolated  . Frequency of Communication with Friends and Family: Never  . Frequency of Social Gatherings with Friends and Family: Never  . Attends Religious Services: Never  . Active Member of Clubs or Organizations: No  . Attends Archivist Meetings: Never  . Marital Status: Never married  Intimate Partner Violence: Not At Risk  . Fear of Current or Ex-Partner: No  . Emotionally Abused: No  . Physically Abused: No  . Sexually Abused: No    Family History  Problem Relation Age of Onset  . Heart attack Brother   . Heart disease Other      Current Outpatient Medications:  .  acetaminophen (TYLENOL) 325 MG tablet, TAKE 2 TABLETS (650 MG TOTAL) BY MOUTH EVERY 6 HOURS AS NEEDED FOR MILD PAIN OR HEADACHE., Disp: 60 tablet, Rfl: 5 .  alfuzosin (UROXATRAL) 10 MG 24 hr tablet, Take 1 tablet (10 mg total) by mouth daily., Disp: 90 tablet, Rfl: 1 .  ALPRAZolam (XANAX) 0.25 MG tablet, TAKE ONE TABLET BY MOUTH 2 TIMES A DAY AS NEEDED FOR ANXIETY (WHEN BLOOD PRESSURE IS VERY HIGH OR ANXIOUS) (Patient taking differently: Take 0.25 mg by mouth 2 (two) times daily  as needed for anxiety or sleep. TAKE ONE TABLET BY MOUTH 2 TIMES A DAY AS NEEDED FOR ANXIETY (WHEN BLOOD PRESSURE IS VERY HIGH OR ANXIOUS)), Disp: 20 tablet, Rfl: 0 .  ARTIFICIAL TEARS 0.2-0.2-1 % SOLN, PLACE 2 DROPS INTO EYE 2 TIMES A DAY AS NEEDED, Disp: 15 mL, Rfl: 1 .  ascorbic acid (VITAMIN C) 500 MG tablet, TAKE ONE TABLET BY MOUTH ONCE DAILY (Patient taking differently: Take 500 mg by mouth daily.), Disp: 90 tablet, Rfl: 1 .  aspirin (ASPIR-LOW) 81 MG EC tablet, TAKE 1 TABLET BY MOUTH ONCE DAILY (Patient taking differently: Take 81 mg by mouth daily. TAKE 1 TABLET BY MOUTH ONCE DAILY), Disp: 90 tablet, Rfl: 1 .  atorvastatin (LIPITOR) 40 MG tablet, Take 1 tablet (40 mg total) by mouth every evening., Disp: 90 tablet, Rfl: 1 .  augmented betamethasone dipropionate (DIPROLENE-AF) 0.05 % cream, APPLY TOPICALLY AS DIRECTED TO RASH AS NEEDED, Disp: 45 g, Rfl: 0 .  Calcium Carb-Cholecalciferol (CALCIUM-VITAMIN D) 500-400 MG-UNIT TABS,  TAKE 1 TABLET BY MOUTH 2 TIMES A DAY, Disp: 180 tablet, Rfl: 0 .  Cranberry Juice Powder 425 MG CAPS, TAKE 1 CAPSULE BY MOUTH ONCE DAILY (Patient taking differently: Take 425 mg by mouth daily.), Disp: 90 capsule, Rfl: 1 .  Elastic Bandages & Supports (MEDICAL COMPRESSION STOCKINGS) MISC, 1 Units by Does not apply route daily., Disp: 2 each, Rfl: 0 .  ferrous sulfate 325 (65 FE) MG EC tablet, Take 325 mg by mouth 3 (three) times daily with meals., Disp: , Rfl:  .  Incontinence Supply Disposable (DEPEND SILHOUETTE BRIEFS L/XL) MISC, 1 each by Does not apply route 2 (two) times daily as needed., Disp: 60 each, Rfl: 5 .  Iron-FA-B Cmp-C-Biot-Probiotic (FUSION PLUS) CAPS, Take 1 tablet by mouth daily., Disp: 30 capsule, Rfl: 2 .  lubiprostone (AMITIZA) 24 MCG capsule, TAKE 1 CAPSULE BY MOUTH 2 TIMES DAILY WITH A MEAL (Patient taking differently: Take 24 mcg by mouth 2 (two) times daily with a meal. TAKE 1 CAPSULE BY MOUTH 2 TIMES DAILY WITH A MEAL), Disp: 180 capsule, Rfl:  1 .  Nutritional Supplements (ENSURE NUTRITION SHAKE) LIQD, Take 1 each by mouth 2 (two) times daily., Disp: 237 mL, Rfl: 30 .  Polyethyl Glycol-Propyl Glycol (SYSTANE ULTRA) 0.4-0.3 % SOLN, Apply 1 drop to eye 2 (two) times daily. Both eyes, Disp: 30 mL, Rfl: 5 .  polyethylene glycol (MIRALAX / GLYCOLAX) 17 g packet, Take 34 g by mouth 2 (two) times daily as needed., Disp: 30 each, Rfl: 0 .  sertraline (ZOLOFT) 100 MG tablet, Take 1 tablet (100 mg total) by mouth daily., Disp: 90 tablet, Rfl: 1  Physical exam:  Vitals:   06/14/20 1451  BP: (!) 116/97  Pulse: (!) 52  Resp: 18  Temp: (!) 96 F (35.6 C)  TempSrc: Tympanic  SpO2: 100%  Weight: 130 lb (59 kg)   Physical Exam Constitutional:      Comments: Thin elderly gentleman.  Sitting in a wheelchair and appears in no acute distress  Eyes:     Extraocular Movements: EOM normal.     Pupils: Pupils are equal, round, and reactive to light.  Cardiovascular:     Rate and Rhythm: Normal rate and regular rhythm.     Heart sounds: Normal heart sounds.  Pulmonary:     Effort: Pulmonary effort is normal.     Breath sounds: Normal breath sounds.  Abdominal:     General: Bowel sounds are normal.     Palpations: Abdomen is soft.  Skin:    General: Skin is warm and dry.  Neurological:     Mental Status: He is alert.      CMP Latest Ref Rng & Units 05/16/2020  Glucose 70 - 99 mg/dL 81  BUN 8 - 23 mg/dL 24(H)  Creatinine 0.61 - 1.24 mg/dL 0.52(L)  Sodium 135 - 145 mmol/L 140  Potassium 3.5 - 5.1 mmol/L 3.7  Chloride 98 - 111 mmol/L 106  CO2 22 - 32 mmol/L 29  Calcium 8.9 - 10.3 mg/dL 8.5(L)  Total Protein 6.5 - 8.1 g/dL -  Total Bilirubin 0.3 - 1.2 mg/dL -  Alkaline Phos 38 - 126 U/L -  AST 15 - 41 U/L -  ALT 0 - 44 U/L -   CBC Latest Ref Rng & Units 06/14/2020  WBC 4.0 - 10.5 K/uL 4.4  Hemoglobin 13.0 - 17.0 g/dL 10.8(L)  Hematocrit 39.0 - 52.0 % 33.1(L)  Platelets 150 - 400 K/uL 124(L)  Assessment and plan- Patient is  a 80 y.o. male referred for intermittent neutropenia  Patient's white cell count has been fluctuating between 4-10 over the last 3 years.  Today his white count is 4.4 with an ANC of 3.1.  He also has mild baseline thrombocytopenia which has remained stable with a platelet count between 120s to 140s.  Previously patient's hemoglobin was closer to 11 but since his hospitalization his hemoglobin went down to the nines and currently at 10.8.  I suspect his anemia will get better as he recovers from his hospitalizations.  I will see him back in 4 months with CBC with differential ferritin and iron studies B12 and folate   Visit Diagnosis 1. Leukopenia, unspecified type   2. Normocytic anemia      Dr. Randa Evens, MD, MPH [facility] [phone] 06/18/2020 7:48 AM

## 2020-06-24 DIAGNOSIS — Z09 Encounter for follow-up examination after completed treatment for conditions other than malignant neoplasm: Secondary | ICD-10-CM | POA: Diagnosis not present

## 2020-06-24 DIAGNOSIS — D61818 Other pancytopenia: Secondary | ICD-10-CM | POA: Diagnosis not present

## 2020-06-24 LAB — BASIC METABOLIC PANEL WITH GFR
BUN/Creatinine Ratio: 38 (calc) — ABNORMAL HIGH (ref 6–22)
BUN: 23 mg/dL (ref 7–25)
CO2: 25 mmol/L (ref 20–32)
Calcium: 8.8 mg/dL (ref 8.6–10.3)
Chloride: 102 mmol/L (ref 98–110)
Creat: 0.61 mg/dL — ABNORMAL LOW (ref 0.70–1.18)
GFR, Est African American: 112 mL/min/{1.73_m2} (ref >=60)
GFR, Est Non African American: 96 mL/min/{1.73_m2} (ref >=60)
Glucose, Bld: 156 mg/dL — ABNORMAL HIGH (ref 65–99)
Potassium: 3.9 mmol/L (ref 3.5–5.3)
Sodium: 135 mmol/L (ref 135–146)

## 2020-06-24 LAB — CBC WITH DIFFERENTIAL/PLATELET
Absolute Monocytes: 300 cells/uL (ref 200–950)
Basophils Absolute: 19 cells/uL (ref 0–200)
Basophils Relative: 0.5 %
Eosinophils Absolute: 48 cells/uL (ref 15–500)
Eosinophils Relative: 1.3 %
HCT: 29.3 % — ABNORMAL LOW (ref 38.5–50.0)
Hemoglobin: 10.1 g/dL — ABNORMAL LOW (ref 13.2–17.1)
Lymphs Abs: 907 cells/uL (ref 850–3900)
MCH: 31.6 pg (ref 27.0–33.0)
MCHC: 34.5 g/dL (ref 32.0–36.0)
MCV: 91.6 fL (ref 80.0–100.0)
MPV: 10.1 fL (ref 7.5–12.5)
Monocytes Relative: 8.1 %
Neutro Abs: 2427 cells/uL (ref 1500–7800)
Neutrophils Relative %: 65.6 %
Platelets: 136 10*3/uL — ABNORMAL LOW (ref 140–400)
RBC: 3.2 10*6/uL — ABNORMAL LOW (ref 4.20–5.80)
RDW: 14.3 % (ref 11.0–15.0)
Total Lymphocyte: 24.5 %
WBC: 3.7 10*3/uL — ABNORMAL LOW (ref 3.8–10.8)

## 2020-06-28 ENCOUNTER — Other Ambulatory Visit: Payer: Self-pay | Admitting: Family Medicine

## 2020-06-28 DIAGNOSIS — N390 Urinary tract infection, site not specified: Secondary | ICD-10-CM

## 2020-06-28 DIAGNOSIS — I700 Atherosclerosis of aorta: Secondary | ICD-10-CM

## 2020-06-28 DIAGNOSIS — E441 Mild protein-calorie malnutrition: Secondary | ICD-10-CM

## 2020-07-26 DIAGNOSIS — H93293 Other abnormal auditory perceptions, bilateral: Secondary | ICD-10-CM | POA: Diagnosis not present

## 2020-07-26 DIAGNOSIS — H6121 Impacted cerumen, right ear: Secondary | ICD-10-CM | POA: Diagnosis not present

## 2020-07-26 DIAGNOSIS — D101 Benign neoplasm of tongue: Secondary | ICD-10-CM | POA: Diagnosis not present

## 2020-08-06 ENCOUNTER — Ambulatory Visit: Payer: Medicare Other

## 2020-08-10 ENCOUNTER — Ambulatory Visit: Payer: Medicare Other

## 2020-08-11 DIAGNOSIS — H5005 Alternating esotropia: Secondary | ICD-10-CM | POA: Diagnosis not present

## 2020-08-11 DIAGNOSIS — H524 Presbyopia: Secondary | ICD-10-CM | POA: Diagnosis not present

## 2020-08-11 DIAGNOSIS — H52223 Regular astigmatism, bilateral: Secondary | ICD-10-CM | POA: Diagnosis not present

## 2020-08-11 DIAGNOSIS — H5203 Hypermetropia, bilateral: Secondary | ICD-10-CM | POA: Diagnosis not present

## 2020-08-11 DIAGNOSIS — H2513 Age-related nuclear cataract, bilateral: Secondary | ICD-10-CM | POA: Diagnosis not present

## 2020-08-19 ENCOUNTER — Ambulatory Visit (INDEPENDENT_AMBULATORY_CARE_PROVIDER_SITE_OTHER): Payer: Medicare Other

## 2020-08-19 DIAGNOSIS — Z0000 Encounter for general adult medical examination without abnormal findings: Secondary | ICD-10-CM | POA: Diagnosis not present

## 2020-08-19 NOTE — Progress Notes (Signed)
Subjective:   [name] is a 80 y.o. male who presents for Medicare Annual/Subsequent preventive examination.  Virtual Visit via Telephone Note  I connected with  [name] on 08/19/20 at  2:50 PM EDT by telephone and verified that I am speaking with the correct person using two identifiers.  Location: Patient: home Provider: [facility] Persons participating in the virtual visit: patient & caregiver [name] [facility] Health Advisor   I discussed the limitations, risks, security and privacy concerns of performing an evaluation and management service by telephone and the availability of in person appointments. The patient expressed understanding and agreed to proceed.  Interactive audio and video telecommunications were attempted between this nurse and patient, however failed, due to patient having technical difficulties OR patient did not have access to video capability.  We continued and completed visit with audio only.  Some vital signs may be absent or patient reported.   Kevin Littler, LPN    Review of Systems     Cardiac Risk Factors include: advanced age (>37men, >72 women);male gender;dyslipidemia;sedentary lifestyle     Objective:    There were no vitals filed for this visit. There is no height or weight on file to calculate BMI.  Advanced Directives 05/15/2020 05/14/2020 04/19/2020 07/07/2019 05/27/2019 03/10/2019 03/09/2019  Does Patient Have a Medical Advance Directive? Unable to assess, patient is non-responsive or altered mental status Unable to assess, patient is non-responsive or altered mental status No Yes No No No  Type of Advance Directive - - - Healthcare Power of Attorney - - -  Does patient want to make changes to medical advance directive? - - - No - Patient declined - - -  Copy of Healthcare Power of Attorney in Chart? - - - No - copy requested - - -  Would patient like information on creating a medical advance directive? - - - - No - Patient  declined No - Patient declined No - Patient declined    Current Medications (verified) Outpatient Encounter Medications as of 08/19/2020  Medication Sig  . acetaminophen (TYLENOL) 325 MG tablet TAKE 2 TABLETS (650 MG TOTAL) BY MOUTH EVERY 6 HOURS AS NEEDED FOR MILD PAIN OR HEADACHE.  Marland Kitchen alfuzosin (UROXATRAL) 10 MG 24 hr tablet Take 1 tablet (10 mg total) by mouth daily.  Marland Kitchen ALPRAZolam (XANAX) 0.25 MG tablet TAKE ONE TABLET BY MOUTH 2 TIMES A DAY AS NEEDED FOR ANXIETY (WHEN BLOOD PRESSURE IS VERY HIGH OR ANXIOUS) (Patient taking differently: Take 0.25 mg by mouth 2 (two) times daily as needed for anxiety or sleep. TAKE ONE TABLET BY MOUTH 2 TIMES A DAY AS NEEDED FOR ANXIETY (WHEN BLOOD PRESSURE IS VERY HIGH OR ANXIOUS))  . ARTIFICIAL TEARS 0.2-0.2-1 % SOLN PLACE 2 DROPS INTO EYE 2 TIMES A DAY AS NEEDED  . ascorbic acid (VITAMIN C) 500 MG tablet TAKE ONE TABLET BY MOUTH ONCE DAILY (Patient taking differently: Take 500 mg by mouth daily.)  . atorvastatin (LIPITOR) 40 MG tablet Take 1 tablet (40 mg total) by mouth every evening.  Marland Kitchen augmented betamethasone dipropionate (DIPROLENE-AF) 0.05 % cream APPLY TOPICALLY AS DIRECTED TO RASH AS NEEDED  . Calcium Carb-Cholecalciferol (CALCIUM-VITAMIN D) 500-400 MG-UNIT TABS TAKE 1 TABLET BY MOUTH 2 TIMES A DAY  . Cranberry Juice Powder 425 MG CAPS TAKE 1 CAPSULE BY MOUTH ONCE DAILY  . Elastic Bandages & Supports (MEDICAL COMPRESSION STOCKINGS) MISC 1 Units by Does not apply route daily.  . Incontinence Supply Disposable (DEPEND SILHOUETTE BRIEFS  L/XL) MISC 1 each by Does not apply route 2 (two) times daily as needed.  . Iron-FA-B Cmp-C-Biot-Probiotic (FUSION PLUS) CAPS Take 1 tablet by mouth daily.  Marland Kitchen lubiprostone (AMITIZA) 24 MCG capsule TAKE 1 CAPSULE BY MOUTH 2 TIMES DAILY WITH A MEAL (Patient taking differently: Take 24 mcg by mouth 2 (two) times daily with a meal. TAKE 1 CAPSULE BY MOUTH 2 TIMES DAILY WITH A MEAL)  . Nutritional Supplements (ENSURE NUTRITION  SHAKE) LIQD Take 1 each by mouth 2 (two) times daily.  Vladimir Faster Glycol-Propyl Glycol (SYSTANE ULTRA) 0.4-0.3 % SOLN Apply 1 drop to eye 2 (two) times daily. Both eyes  . sertraline (ZOLOFT) 100 MG tablet Take 1 tablet (100 mg total) by mouth daily.  Marland Kitchen aspirin (ASPIR-LOW) 81 MG EC tablet TAKE 1 TABLET BY MOUTH ONCE DAILY (Patient not taking: Reported on 08/19/2020)  . [DISCONTINUED] ferrous sulfate 325 (65 FE) MG EC tablet Take 325 mg by mouth 3 (three) times daily with meals.  . [DISCONTINUED] polyethylene glycol (MIRALAX / GLYCOLAX) 17 g packet Take 34 g by mouth 2 (two) times daily as needed.   No facility-administered encounter medications on file as of 08/19/2020.    Allergies (verified) Patient has no known allergies.   History: Past Medical History:  Diagnosis Date  . Anxiety   . Atrophy, disuse, muscle   . BPH (benign prostatic hyperplasia)   . Cerebral palsy ([place])   . Eczema   . Elevated lipids   . Elevated PSA   . Exotropia   . Hyperlipidemia   . Kidney stone   . Leukopenia   . Lower extremity edema    lymphedema  . Mental retardation   . Over weight   . Prostate cancer ([place])   . Venous insufficiency   . Vitamin D deficiency   . Wheelchair dependence    Past Surgical History:  Procedure Laterality Date  . COLONOSCOPY WITH PROPOFOL N/A 05/10/2020   Procedure: COLONOSCOPY WITH PROPOFOL;  Surgeon: Lin Landsman, MD;  Location: [facility] ENDOSCOPY;  Service: Gastroenterology;  Laterality: N/A;  . COLONOSCOPY WITH PROPOFOL N/A 05/12/2020   Procedure: COLONOSCOPY WITH PROPOFOL;  Surgeon: Lin Landsman, MD;  Location: [facility] ENDOSCOPY;  Service: Gastroenterology;  Laterality: N/A;  . CYSTOSCOPY/URETEROSCOPY/HOLMIUM LASER/STENT PLACEMENT Right 08/14/2017   Procedure: CYSTOSCOPY/URETEROSCOPY/HOLMIUM LASER/STENT PLACEMENT;  Surgeon: Abbie Sons, MD;  Location: [facility] ORS;  Service: Urology;  Laterality: Right;  . ESOPHAGOGASTRODUODENOSCOPY (EGD) WITH PROPOFOL N/A  05/12/2020   Procedure: ESOPHAGOGASTRODUODENOSCOPY (EGD) WITH PROPOFOL;  Surgeon: Lin Landsman, MD;  Location[facility] END[facility]COPY;  Service: Gastroenterology;  Laterality: N/A;  . leg circulation surgery Right   . PROSTATE SURGERY  11/10/2008   BRACHYTHERAPY   Family History  Problem Relation Age of Onset  . Heart attack Brother   . Heart disease Other    Social History   Socioeconomic History  . Marital status: Single    Spouse name: Not on file  . Number of children: Not on file  . Years of education: Not on file  . Highest education level: Not on file  Occupational History  . Not on file  Tobacco Use  . Smoking status: Never Smoker  . Smokeless tobacco: Never Used  Vaping Use  . Vaping Use: Never used  Substance and Sexual Activity  . Alcohol use: No    Alcohol/week: 0.0 standard drinks  . Drug use: No  . Sexual activity: Not Currently  Other Topics Concern  . Not on file  Social History  Narrative   Lives at [facility] group home, 5 males. Moved to higher level of care 04/28/19 with Anselm Pancoast.     Social Determinants of Health   Financial Resource Strain: Low Risk   . Difficulty of Paying Living Expenses: Not hard at all  Food Insecurity: No Food Insecurity  . Worried About Programme researcher, broadcasting/film/video in the Last Year: Never true  . Ran Out of Food in the Last Year: Never true  Transportation Needs: No Transportation Needs  . Lack of Transportation (Medical): No  . Lack of Transportation (Non-Medical): No  Physical Activity: Inactive  . Days of Exercise per Week: 0 days  . Minutes of Exercise per Session: 0 min  Stress: No Stress Concern Present  . Feeling of Stress : Not at all  Social Connections: Socially Isolated  . Frequency of Communication with Friends and Family: Never  . Frequency of Social Gatherings with Friends and Family: Never  . Attends Religious Services: Never  . Active Member of Clubs or Organizations: No  . Attends Banker  Meetings: Never  . Marital Status: Never married    Tobacco Counseling Counseling given: Not Answered   Clinical Intake:  Pre-visit preparation completed: Yes  Pain : No/denies pain     Nutritional Risks: None Diabetes: No  How often do you need to have someone help you when you read instructions, pamphlets, or other written materials from your doctor or pharmacy?: 1 - Never    Interpreter Needed?: No  Information entered by :: Kevin Littler LPN   Activities of Daily Living In your present state of health, do you have any difficulty performing the following activities: 08/19/2020 05/18/2020  Hearing? N N  Comment declines hearing aids -  Vision? N N  Difficulty concentrating or making decisions? Malvin Johns  Walking or climbing stairs? Y Y  Dressing or bathing? Y Y  Doing errands, shopping? Malvin Johns  Preparing Food and eating ? Y -  Using the Toilet? Y -  In the past six months, have you accidently leaked urine? Y -  Do you have problems with loss of bowel control? Y -  Managing your Medications? Y -  Managing your Finances? Y -  Housekeeping or managing your Housekeeping? Y -  Some recent data might be hidden    Patient Care Team: Alba Cory, MD as PCP - General (Family Medicine) Alba Cory, MD (Family Medicine) Antonietta Breach, MD as Consulting Physician (Psychiatry) Riki Altes, MD (Urology) Irene Limbo., MD (Ophthalmology) Gilda Crease, Latina Craver, MD (Vascular Surgery)  Indicate any recent Medical Services you may have received from other than [facility] providers in the past year (date may be approximate).     Assessment:   This is a routine wellness examination for Zahid.  Hearing/Vision screen  Hearing Screening   125Hz  250Hz  500Hz  1000Hz  2000Hz  3000Hz  4000Hz  6000Hz  8000Hz   Right ear:           Left ear:           Comments: Pt denies hearing difficulty  Vision Screening Comments: Annual vision screenings done by Dr.  Dietary issues and  exercise activities discussed: Current Exercise Habits: The patient does not participate in regular exercise at present, Exercise limited by: orthopedic condition(s);neurologic condition(s)  Goals   None    Depression Screen PHQ 2/9 Scores 08/19/2020 05/18/2020 03/01/2020 01/20/2020 08/29/2019 08/07/2019 07/31/2019  PHQ - 2 Score 0 0 2 0 - 0 0  PHQ- 9 Score - -  2 - - 0 -  Exception Documentation - - - - Medical reason - -  Not completed - - - - - - -    Fall Risk Fall Risk  08/19/2020 05/18/2020 03/01/2020 01/20/2020 08/29/2019  Falls in the past year? 0 0 0 0 0  Number falls in past yr: 0 0 0 0 0  Injury with Fall? 0 0 0 0 0  Risk Factor Category  - - - - -  Risk for fall due to : Impaired mobility - - Impaired mobility;Mental status change -  Follow up Falls prevention discussed - - - -    FALL RISK PREVENTION PERTAINING TO THE HOME:  Any stairs in or around the home? No  If so, are there any without handrails? No  Home free of loose throw rugs in walkways, pet beds, electrical cords, etc? Yes  Adequate lighting in your home to reduce risk of falls? Yes   ASSISTIVE DEVICES UTILIZED TO PREVENT FALLS:  Life alert? No  Use of a cane, walker or w/c? Yes  Grab bars in the bathroom? Yes  Shower chair or bench in shower? Yes  Elevated toilet seat or a handicapped toilet? Yes   TIMED UP AND GO:  Was the test performed? No . Telephonic visit.   Cognitive Function: Cognitive status assessed by direct observation. Patient is unable to complete screening 6CIT or MMSE.          Immunizations Immunization History  Administered Date(s) Administered  . Fluad Quad(high Dose 65+) 03/01/2020  . Influenza, High Dose Seasonal PF 02/11/2015, 02/14/2016, 02/14/2017, 02/19/2018, 01/27/2019, 01/20/2020  . Influenza,inj,Quad PF,6+ Mos 02/04/2013  . Influenza-Unspecified 12/31/2013, 02/11/2015  . Moderna Sars-Covid-2 Vaccination 06/24/2019, 07/26/2019  . Pneumococcal Conjugate-13 06/22/2014,  06/22/2014  . Pneumococcal Polysaccharide-23 09/15/2009, 09/15/2009  . Tdap 09/15/2009, 09/15/2009, 01/20/2020    TDAP status: Up to date  Flu Vaccine status: Up to date  Pneumococcal vaccine status: Up to date  Covid-19 vaccine status: Completed vaccines  Qualifies for Shingles Vaccine? Yes   Zostavax completed No   Shingrix Completed?: No.    Education has been provided regarding the importance of this vaccine. Patient has been advised to call insurance company to determine out of pocket expense if they have not yet received this vaccine. Advised may also receive vaccine at local pharmacy or Health Dept. Verbalized acceptance and understanding.  Screening Tests Health Maintenance  Topic Date Due  . COVID-19 Vaccine (3 - Moderna risk 4-dose series) 08/23/2019  . INFLUENZA VACCINE  11/22/2020  . TETANUS/TDAP  01/19/2030  . Hepatitis C Screening  Completed  . PNA vac Low Risk Adult  Completed  . HPV VACCINES  Aged Out    Health Maintenance  Health Maintenance Due  Topic Date Due  . COVID-19 Vaccine (3 - Moderna risk 4-dose series) 08/23/2019    Colorectal cancer screening: No longer required.   Lung Cancer Screening: (Low Dose CT Chest recommended if Age 16-80 years, 30 pack-year currently smoking OR have quit w/in 15years.) does not qualify.   Additional Screening:  Hepatitis C Screening: does qualify; Completed 06/21/18  Vision Screening: Recommended annual ophthalmology exams for early detection of glaucoma and other disorders of the eye. Is the patient up to date with their annual eye exam?  Yes  Who is the provider or what is the name of the office in which the patient attends annual eye exams? Dr. Matilde Sprang.   Dental Screening: Recommended annual dental exams for proper oral hygiene  Community  Resource Referral / Chronic Care Management: CRR required this visit?  No   CCM required this visit?  No      Plan:     I have personally reviewed and noted the  following in the patient's chart:   . Medical and social history . Use of alcohol, tobacco or illicit drugs  . Current medications and supplements . Functional ability and status . Nutritional status . Physical activity . Advanced directives . List of other physicians . Hospitalizations, surgeries, and ER visits in previous 12 months . Vitals . Screenings to include cognitive, depression, and falls . Referrals and appointments  In addition, I have reviewed and discussed with patient certain preventive protocols, quality metrics, and best practice recommendations. A written personalized care plan for preventive services as well as general preventive health recommendations were provided to patient.     Clemetine Marker, LPN   [id number]   Nurse Notes: none

## 2020-08-19 NOTE — Patient Instructions (Signed)
[name] , Thank you for taking time to come for your Medicare Wellness Visit. I appreciate your ongoing commitment to your health goals. Please review the following plan we discussed and let me know if I can assist you in the future.   Screening recommendations/referrals: Colonoscopy: done 05/10/20  Recommended yearly ophthalmology/optometry visit for glaucoma screening and checkup Recommended yearly dental visit for hygiene and checkup  Vaccinations: Influenza vaccine: done 03/01/20 Pneumococcal vaccine: done 06/22/14 Tdap vaccine: done 01/20/20 Shingles vaccine: Shingrix discussed. Please contact your pharmacy for coverage information.  Covid-19: done 06/24/19, 07/26/19; please bring vaccine record to next appointment for booster information    Conditions/risks identified: Recommend continuing fall prevention in the home  Next appointment: Follow up in one year for your annual wellness visit.   Preventive Care 22 Years and Older, Male Preventive care refers to lifestyle choices and visits with your health care provider that can promote health and wellness. What does preventive care include?  A yearly physical exam. This is also called an annual well check.  Dental exams once or twice a year.  Routine eye exams. Ask your health care provider how often you should have your eyes checked.  Personal lifestyle choices, including:  Daily care of your teeth and gums.  Regular physical activity.  Eating a healthy diet.  Avoiding tobacco and drug use.  Limiting alcohol use.  Practicing safe sex.  Taking low doses of aspirin every day.  Taking vitamin and mineral supplements as recommended by your health care provider. What happens during an annual well check? The services and screenings done by your health care provider during your annual well check will depend on your age, overall health, lifestyle risk factors, and family history of disease. Counseling  Your health care  provider may ask you questions about your:  Alcohol use.  Tobacco use.  Drug use.  Emotional well-being.  Home and relationship well-being.  Sexual activity.  Eating habits.  History of falls.  Memory and ability to understand (cognition).  Work and work Statistician. Screening  You may have the following tests or measurements:  Height, weight, and BMI.  Blood pressure.  Lipid and cholesterol levels. These may be checked every 5 years, or more frequently if you are over 43 years old.  Skin check.  Lung cancer screening. You may have this screening every year starting at age 37 if you have a 30-pack-year history of smoking and currently smoke or have quit within the past 15 years.  Fecal occult blood test (FOBT) of the stool. You may have this test every year starting at age 94.  Flexible sigmoidoscopy or colonoscopy. You may have a sigmoidoscopy every 5 years or a colonoscopy every 10 years starting at age 83.  Prostate cancer screening. Recommendations will vary depending on your family history and other risks.  Hepatitis C blood test.  Hepatitis B blood test.  Sexually transmitted disease (STD) testing.  Diabetes screening. This is done by checking your blood sugar (glucose) after you have not eaten for a while (fasting). You may have this done every 1-3 years.  Abdominal aortic aneurysm (AAA) screening. You may need this if you are a current or former smoker.  Osteoporosis. You may be screened starting at age 42 if you are at high risk. Talk with your health care provider about your test results, treatment options, and if necessary, the need for more tests. Vaccines  Your health care provider may recommend certain vaccines, such as:  Influenza vaccine. This  is recommended every year.  Tetanus, diphtheria, and acellular pertussis (Tdap, Td) vaccine. You may need a Td booster every 10 years.  Zoster vaccine. You may need this after age 70.  Pneumococcal  13-valent conjugate (PCV13) vaccine. One dose is recommended after age 1.  Pneumococcal polysaccharide (PPSV23) vaccine. One dose is recommended after age 37. Talk to your health care provider about which screenings and vaccines you need and how often you need them. This information is not intended to replace advice given to you by your health care provider. Make sure you discuss any questions you have with your health care provider. Document Released: 05/07/2015 Document Revised: 12/29/2015 Document Reviewed: 02/09/2015 Elsevier Interactive Patient Education  2017 [place] Prevention in the Home Falls can cause injuries. They can happen to people of all ages. There are many things you can do to make your home safe and to help prevent falls. What can I do on the outside of my home?  Regularly fix the edges of walkways and driveways and fix any cracks.  Remove anything that might make you trip as you walk through a door, such as a raised step or threshold.  Trim any bushes or trees on the path to your home.  Use bright outdoor lighting.  Clear any walking paths of anything that might make someone trip, such as rocks or tools.  Regularly check to see if handrails are loose or broken. Make sure that both sides of any steps have handrails.  Any raised decks and porches should have guardrails on the edges.  Have any leaves, snow, or ice cleared regularly.  Use sand or salt on walking paths during winter.  Clean up any spills in your garage right away. This includes oil or grease spills. What can I do in the bathroom?  Use night lights.  Install grab bars by the toilet and in the tub and shower. Do not use towel bars as grab bars.  Use non-skid mats or decals in the tub or shower.  If you need to sit down in the shower, use a plastic, non-slip stool.  Keep the floor dry. Clean up any water that spills on the floor as soon as it happens.  Remove soap buildup in the  tub or shower regularly.  Attach bath mats securely with double-sided non-slip rug tape.  Do not have throw rugs and other things on the floor that can make you trip. What can I do in the bedroom?  Use night lights.  Make sure that you have a light by your bed that is easy to reach.  Do not use any sheets or blankets that are too big for your bed. They should not hang down onto the floor.  Have a firm chair that has side arms. You can use this for support while you get dressed.  Do not have throw rugs and other things on the floor that can make you trip. What can I do in the kitchen?  Clean up any spills right away.  Avoid walking on wet floors.  Keep items that you use a lot in easy-to-reach places.  If you need to reach something above you, use a strong step stool that has a grab bar.  Keep electrical cords out of the way.  Do not use floor polish or wax that makes floors slippery. If you must use wax, use non-skid floor wax.  Do not have throw rugs and other things on the floor that can make you  trip. What can I do with my stairs?  Do not leave any items on the stairs.  Make sure that there are handrails on both sides of the stairs and use them. Fix handrails that are broken or loose. Make sure that handrails are as long as the stairways.  Check any carpeting to make sure that it is firmly attached to the stairs. Fix any carpet that is loose or worn.  Avoid having throw rugs at the top or bottom of the stairs. If you do have throw rugs, attach them to the floor with carpet tape.  Make sure that you have a light switch at the top of the stairs and the bottom of the stairs. If you do not have them, ask someone to add them for you. What else can I do to help prevent falls?  Wear shoes that:  Do not have high heels.  Have rubber bottoms.  Are comfortable and fit you well.  Are closed at the toe. Do not wear sandals.  If you use a stepladder:  Make sure that it  is fully opened. Do not climb a closed stepladder.  Make sure that both sides of the stepladder are locked into place.  Ask someone to hold it for you, if possible.  Clearly mark and make sure that you can see:  Any grab bars or handrails.  First and last steps.  Where the edge of each step is.  Use tools that help you move around (mobility aids) if they are needed. These include:  Canes.  Walkers.  Scooters.  Crutches.  Turn on the lights when you go into a dark area. Replace any light bulbs as soon as they burn out.  Set up your furniture so you have a clear path. Avoid moving your furniture around.  If any of your floors are uneven, fix them.  If there are any pets around you, be aware of where they are.  Review your medicines with your doctor. Some medicines can make you feel dizzy. This can increase your chance of falling. Ask your doctor what other things that you can do to help prevent falls. This information is not intended to replace advice given to you by your health care provider. Make sure you discuss any questions you have with your health care provider. Document Released: 02/04/2009 Document Revised: 09/16/2015 Document Reviewed: 05/15/2014 Elsevier Interactive Patient Education  2017 [employer].

## 2020-08-25 ENCOUNTER — Other Ambulatory Visit: Payer: Self-pay | Admitting: Family Medicine

## 2020-08-25 DIAGNOSIS — H04123 Dry eye syndrome of bilateral lacrimal glands: Secondary | ICD-10-CM

## 2020-08-25 NOTE — Telephone Encounter (Signed)
Last seen: 1.25.2022 Next scheduled: nothing scheduled

## 2020-08-25 NOTE — Telephone Encounter (Signed)
   Notes to clinic:  this script has expired  Review for refill    Requested Prescriptions  Pending Prescriptions Disp Refills   SYSTANE ULTRA 0.4-0.3 % SOLN [Pharmacy Med Name: SYSTANE ULTRA 0.4-0.3% EYE SOLN ML] 10 mL     Sig: PLACE 1 DROP INTO EACH EYE 2 TIMES A DAY      Ophthalmology:  Lubricants Passed - 08/25/2020  9:06 AM      Passed - Valid encounter within last 12 months    Recent Outpatient Visits           3 months ago Hospital discharge follow-up   [facility] Steele Sizer, MD   5 months ago Atherosclerosis of aorta [facility])   [facility] Steele Sizer, MD   7 months ago Recurrent UTI   [facility] Steele Sizer, MD   12 months ago Gum symptoms   [facility] Steele Sizer, MD   1 year ago Moderate protein malnutrition [facility])   [facility] Steele Sizer, MD

## 2020-08-28 ENCOUNTER — Other Ambulatory Visit: Payer: Self-pay | Admitting: Gastroenterology

## 2020-09-16 DIAGNOSIS — L89300 Pressure ulcer of unspecified buttock, unstageable: Secondary | ICD-10-CM | POA: Diagnosis not present

## 2020-09-16 DIAGNOSIS — G809 Cerebral palsy, unspecified: Secondary | ICD-10-CM | POA: Diagnosis not present

## 2020-09-27 ENCOUNTER — Other Ambulatory Visit: Payer: Self-pay | Admitting: Family Medicine

## 2020-09-27 DIAGNOSIS — E441 Mild protein-calorie malnutrition: Secondary | ICD-10-CM

## 2020-09-28 NOTE — Telephone Encounter (Signed)
mb full not able to inform that prescription has been sent to pharmacy

## 2020-09-30 ENCOUNTER — Other Ambulatory Visit: Payer: Self-pay | Admitting: Family Medicine

## 2020-09-30 DIAGNOSIS — H04123 Dry eye syndrome of bilateral lacrimal glands: Secondary | ICD-10-CM

## 2020-10-08 ENCOUNTER — Other Ambulatory Visit: Payer: Self-pay | Admitting: Family Medicine

## 2020-10-08 DIAGNOSIS — E441 Mild protein-calorie malnutrition: Secondary | ICD-10-CM

## 2020-10-08 NOTE — Telephone Encounter (Signed)
Last seen 1.25.2022 no future appt sch'd

## 2020-10-08 NOTE — Telephone Encounter (Signed)
  Notes to clinic: this script has expired Review for continued use and refill   Requested Prescriptions  Pending Prescriptions Disp Refills   Nutritional Supplements (ENSURE ORIGINAL) LIQD [Pharmacy Med Name: ENSURE LIQ ML] 5,688 mL     Sig: DRINK 1 CAN 2 TIMES A DAY      There is no refill protocol information for this order

## 2020-10-11 ENCOUNTER — Telehealth: Payer: Self-pay | Admitting: *Deleted

## 2020-10-11 NOTE — Chronic Care Management (AMB) (Signed)
  Chronic Care Management   Outreach Note  10/11/2020 Name: [name] MRN: [id number] DOB: [birth year]  [name] is a 80 y.o. year old male who is a primary care patient of Steele Sizer, MD. I reached out to [name] by phone today in response to a referral sent by Mr. PELHAM HENNICK PCP, Dr. Ancil Boozer.      An unsuccessful telephone outreach was attempted today. The patient was referred to the case management team for assistance with care management and care coordination.   Follow Up Plan: The care management team will reach out to the patient again over the next 7 days. If patient returns call to provider office, please advise to call [place] at [phone].  [place] [facility]

## 2020-10-12 ENCOUNTER — Telehealth: Payer: Self-pay | Admitting: Oncology

## 2020-10-12 ENCOUNTER — Inpatient Hospital Stay: Payer: Medicare Other

## 2020-10-12 ENCOUNTER — Other Ambulatory Visit: Payer: Medicare Other

## 2020-10-12 ENCOUNTER — Inpatient Hospital Stay: Payer: Medicare Other | Attending: Oncology | Admitting: Oncology

## 2020-10-12 NOTE — Telephone Encounter (Signed)
Attempt made to call patient about missed appt today. Mailbox is full and unable to leave message.

## 2020-10-14 NOTE — Chronic Care Management (AMB) (Signed)
  Chronic Care Management   Note  10/14/2020 Name: [name] MRN: [id number] DOB: 09/03/1940  [name] is a 80 y.o. year old male who is a primary care patient of Steele Sizer, MD. I reached out to [name] by phone today in response to a referral sent by [name] PCP, Dr. Ancil Boozer.      [name] was given information about Chronic Care Management services today including:  CCM service includes personalized support from designated clinical staff supervised by his physician, including individualized plan of care and coordination with other care providers 24/7 contact phone numbers for assistance for urgent and routine care needs. Service will only be billed when office clinical staff spend 20 minutes or more in a month to coordinate care. Only one practitioner may furnish and bill the service in a calendar month. The patient may stop CCM services at any time (effective at the end of the month) by phone call to the office staff. The patient will be responsible for cost sharing (co-pay) of up to 20% of the service fee (after annual deductible is met).  Verified by Argentina Donovan, Wells Guiles and Melchor Amour, and patient verbally agreed to assistance and services provided by embedded care coordination/care management team today.  Follow up plan: Telephone appointment with care management team member scheduled for:10/21/2020  [place] [facility]

## 2020-10-19 ENCOUNTER — Telehealth: Payer: Self-pay

## 2020-10-19 NOTE — Progress Notes (Signed)
Chronic Care Management Pharmacy Assistant   Name: [name]  MRN: [id number] DOB: Apr 17, 1941  Chart Review for Clinical Pharmacist on 10/21/2020.  Conditions to be addressed/monitored: HLD, Anxiety, Depression, and Chronic Venous insufficiency, Cerebral Vascular disease PAP, Aortic Valve Calcification, Coronary artery calcification, Chronic Constipation, Cerebral palsy,BPH, Intellectual disability, Edema leg, Avitaminosis D, Normocytic anemia .  Primary concerns for visit include: Overall Health, Weight    Recent office visits:  05/18/2020 Dr. Ancil Boozer MD (PCP) No medication changes noted.  Recent consult visits:  06/14/2020 Dr. Janese Banks (oncology) No Medication Changes noted 06/14/2020 Dr. Marius Ditch MD (Gastroenterology)  recommended to take oral iron 1 pill a day for 3 months. 06/11/2020 Dr. Marius Ditch MD (Gastroenterology) No medication changes noted.  Hospital visits:  Medication Reconciliation was completed by comparing discharge summary, patient's EMR and Pharmacy list, and upon discussion with patient.  Admitted to the hospital on 05/07/2020 due to HCAP (healthcare-associated pnemonia). Discharge date was 05/17/2020. Discharged from [place]?Medications Started at [facility] Discharge:?? -started Levaquin 750 mg daily for next 6 days for Pseudomonas bacteremia.   Medication Changes at Hospital Discharge: -Changed None  Medications Discontinued at Hospital Discharge: -Stopped None  Medications that remain the same after Hospital Discharge:??  -All other medications will remain the same.    Have you seen any other providers since your last visit? no Any changes in your medications or health? no Any side effects from any medications? no Do you have an symptoms or problems not managed by your medications? no Any concerns about your health right now? Yes  Patient Caregiver states they are concern about patient being under weight. Has your provider  asked that you check blood pressure, blood sugar, or follow special diet at home? Yes  Patient Caregiver states patient checks his blood pressure daily range around [id number]. Do you get any type of exercise on a regular basis? Yes  Patient Caregiver states patient dopes arm exercise depending on his mood. Can you think of a goal you would like to reach for your health? None ID Do you have any problems getting your medications? no Is there anything that you would like to discuss during the appointment?   Overall Health, Weight  Please bring medications and supplements to appointment  Medications: Outpatient Encounter Medications as of 10/19/2020  Medication Sig   acetaminophen (TYLENOL) 325 MG tablet TAKE 2 TABLETS (650 MG TOTAL) BY MOUTH EVERY 6 HOURS AS NEEDED FOR MILD PAIN OR HEADACHE.   alfuzosin (UROXATRAL) 10 MG 24 hr tablet Take 1 tablet (10 mg total) by mouth daily.   ALPRAZolam (XANAX) 0.25 MG tablet TAKE ONE TABLET BY MOUTH 2 TIMES A DAY AS NEEDED FOR ANXIETY (WHEN BLOOD PRESSURE IS VERY HIGH OR ANXIOUS) (Patient taking differently: Take 0.25 mg by mouth 2 (two) times daily as needed for anxiety or sleep. TAKE ONE TABLET BY MOUTH 2 TIMES A DAY AS NEEDED FOR ANXIETY (WHEN BLOOD PRESSURE IS VERY HIGH OR ANXIOUS))   aspirin (ASPIR-LOW) 81 MG EC tablet TAKE 1 TABLET BY MOUTH ONCE DAILY (Patient not taking: Reported on 08/19/2020)   atorvastatin (LIPITOR) 40 MG tablet Take 1 tablet (40 mg total) by mouth every evening.   augmented betamethasone dipropionate (DIPROLENE-AF) 0.05 % cream APPLY TOPICALLY AS DIRECTED TO RASH AS NEEDED   Calcium Carb-Cholecalciferol (CALCIUM-VITAMIN D) 500-400 MG-UNIT TABS TAKE 1 TABLET BY MOUTH 2 TIMES A DAY   Cranberry Juice Powder 425 MG CAPS TAKE 1 CAPSULE BY MOUTH ONCE  DAILY   Elastic Bandages & Supports (MEDICAL COMPRESSION STOCKINGS) MISC 1 Units by Does not apply route daily.   Incontinence Supply Disposable (DEPEND SILHOUETTE BRIEFS L/XL) MISC 1 each  by Does not apply route 2 (two) times daily as needed.   Iron-FA-B Cmp-C-Biot-Probiotic (FUSION PLUS) CAPS TAKE 1 CAPSULE BY MOUTH DAILY   lubiprostone (AMITIZA) 24 MCG capsule TAKE 1 CAPSULE BY MOUTH 2 TIMES DAILY WITH A MEAL (Patient taking differently: Take 24 mcg by mouth 2 (two) times daily with a meal. TAKE 1 CAPSULE BY MOUTH 2 TIMES DAILY WITH A MEAL)   Nutritional Supplements (ENSURE ORIGINAL) LIQD DRINK 1 CAN 2 TIMES A DAY   sertraline (ZOLOFT) 100 MG tablet Take 1 tablet (100 mg total) by mouth daily.   SYSTANE ULTRA 0.4-0.3 % SOLN PLACE 1 DROP INTO BOTH EYES 2 TIMES DAILY AS NEEDED   vitamin C (ASCORBIC ACID) 500 MG tablet Take 1 tablet (500 mg total) by mouth daily.   No facility-administered encounter medications on file as of 10/19/2020.    Care Gaps: None ID Star Rating Drugs: Atorvastatin 40 mg last filled on 09/28/2020 for 30 day supply at [facility].  [place] Pharmacist Assistant [phone]

## 2020-10-21 ENCOUNTER — Ambulatory Visit (INDEPENDENT_AMBULATORY_CARE_PROVIDER_SITE_OTHER): Payer: Medicare Other

## 2020-10-21 DIAGNOSIS — F418 Other specified anxiety disorders: Secondary | ICD-10-CM | POA: Diagnosis not present

## 2020-10-21 DIAGNOSIS — N400 Enlarged prostate without lower urinary tract symptoms: Secondary | ICD-10-CM | POA: Diagnosis not present

## 2020-10-21 DIAGNOSIS — E785 Hyperlipidemia, unspecified: Secondary | ICD-10-CM | POA: Diagnosis not present

## 2020-10-21 NOTE — Patient Instructions (Signed)
Visit Information It was great speaking with you today!  Please let me know if you have any questions about our visit.   Goals Addressed             This Visit's Progress    Track and Manage My Symptoms-Depression       Timeframe:  Long-Range Goal Priority:  High Start Date:  10/21/2020                            Expected End Date: 04/22/2021                       Follow Up Date 04/06/2021    - have a plan for how to handle bad days - spend time or talk with others at least 2 to 3 times per week - watch for early signs of feeling worse    Why is this important?   Keeping track of your progress will help your treatment team find the right mix of medicine and therapy for you.  Write in your journal every day.  Day-to-day changes in depression symptoms are normal. It may be more helpful to check your progress at the end of each week instead of every day.     Notes:          Patient Care Plan: General Pharmacy (Adult)     Problem Identified: Hyperlipidemia, Coronary Artery Disease, Depression, Anxiety, and BPH   Priority: High     Long-Range Goal: Patient-Specific Goal   Start Date: 10/21/2020  Expected End Date: 04/22/2021  This Visit's Progress: On track  Priority: High  Note:   Current Barriers:  No barriers noted  Pharmacist Clinical Goal(s):  Patient will maintain control of cholesterol as evidenced by LDL less than 70  through collaboration with PharmD and provider.   Interventions: 1:1 collaboration with Steele Sizer, MD regarding development and update of comprehensive plan of care as evidenced by provider attestation and co-signature Inter-disciplinary care team collaboration (see longitudinal plan of care) Comprehensive medication review performed; medication list updated in electronic medical record  Hyperlipidemia: (LDL goal < 70) -Controlled -History of CAD, PAD  -Current treatment: Atorvastatin 40 mg daily  -Current treatment: Aspirin 81 mg  daily  -Medications previously tried: NA  -Previously had instances of rectal bleeding  -Recommended to continue current medication  Depression/Anxiety (Goal: Maintain stable mood) -Not ideally controlled -Current treatment: Alprazolam 0.25 mg twice daily  Sertraline 100 mg daily  -Medications previously tried/failed: NA -"Flares up" about once weekly  -Recommended to continue current medication  BPH (Goal: Maintain symptom control) -Controlled -Current treatment  Alfuzosin 10 mg daily  -Medications previously tried: NA  -Recommended to continue current medication  Patient Goals/Self-Care Activities Patient will:  - take medications as prescribed  Follow Up Plan: Telephone follow up appointment with care management team member scheduled for:  04/06/2021 at 9:00 AM    [name] was given information about Chronic Care Management services today including:  CCM service includes personalized support from designated clinical staff supervised by his physician, including individualized plan of care and coordination with other care providers 24/7 contact phone numbers for assistance for urgent and routine care needs. Standard insurance, coinsurance, copays and deductibles apply for chronic care management only during months in which we provide at least 20 minutes of these services. Most insurances cover these services at 100%, however patients may be responsible for any copay, coinsurance and/or deductible  if applicable. This service may help you avoid the need for more expensive face-to-face services. Only one practitioner may furnish and bill the service in a calendar month. The patient may stop CCM services at any time (effective at the end of the month) by phone call to the office staff.  Patient agreed to services and verbal consent obtained.   Patient verbalizes understanding of instructions provided today and agrees to view in [place].   Doristine Section, [place] [facility] [phone]

## 2020-10-21 NOTE — Progress Notes (Signed)
Chronic Care Management Pharmacy Note  10/21/2020 Name:  AUTUMN GUNN MRN:  [id number] DOB:  Oct 07, 1940  Summary: Today's visit was conducted with [name] who provides 100% of the patient's history. She reports he continues to have a gradual decline in weight despite having a great appetite. She also reports patient has outbursts 1-2 times weekly.  Recommendations/Changes made from today's visit: Continue current medications.  Could consider switching sertraline for mirtazpaine for mood/weigh gain.   Plan: CPP follow-up in 6 months    Subjective: COOLIDGE GOSSARD is an 80 y.o. year old male who is a primary patient of Steele Sizer, MD.  The CCM team was consulted for assistance with disease management and care coordination needs.    Engaged with patient by telephone for initial visit in response to provider referral for pharmacy case management and/or care coordination services.   Consent to Services:  The patient was given the following information about Chronic Care Management services today, agreed to services, and gave verbal consent: 1. CCM service includes personalized support from designated clinical staff supervised by the primary care provider, including individualized plan of care and coordination with other care providers 2. 24/7 contact phone numbers for assistance for urgent and routine care needs. 3. Service will only be billed when office clinical staff spend 20 minutes or more in a month to coordinate care. 4. Only one practitioner may furnish and bill the service in a calendar month. 5.The patient may stop CCM services at any time (effective at the end of the month) by phone call to the office staff. 6. The patient will be responsible for cost sharing (co-pay) of up to 20% of the service fee (after annual deductible is met). Patient agreed to services and consent obtained.  Patient Care Team: Steele Sizer, MD as PCP - General (Family Medicine) Steele Sizer, MD (Family Medicine) Alvy Bimler, MD as Consulting Physician (Psychiatry) Abbie Sons, MD (Urology) Lorelee Cover., MD (Ophthalmology) Delana Meyer, Dolores Lory, MD (Vascular Surgery) Germaine Pomfret, [facility] (Pharmacist)  Recent office visits: 05/18/2020 Dr. Ancil Boozer MD (PCP) No medication changes noted.  Recent consult visits: 06/14/2020 Dr. Janese Banks (oncology) No Medication Changes noted 06/14/2020 Dr. Marius Ditch MD (Gastroenterology)  recommended to take oral iron 1 pill a day for 3 months. 06/11/2020 Dr. Marius Ditch MD (Gastroenterology) No medication changes noted.  Hospital visits: Admitted to the hospital on 05/07/2020 due to HCAP (healthcare-associated pnemonia). Discharge date was 05/17/2020. Discharged from [facility]?Medications Started at [facility] Discharge:?? -started Levaquin 750 mg daily for next 6 days for Pseudomonas bacteremia.    Medication Changes at Hospital Discharge: -Changed None   Medications Discontinued at Hospital Discharge: -Stopped None   Medications that remain the same after Hospital Discharge:?? -All other medications will remain the same.     Objective:  Lab Results  Component Value Date   CREATININE 0.61 (L) 06/24/2020   BUN 23 06/24/2020   GFRNONAA 96 06/24/2020   GFRAA 112 06/24/2020   NA 135 06/24/2020   K 3.9 06/24/2020   CALCIUM 8.8 06/24/2020   CO2 25 06/24/2020   GLUCOSE 156 (H) 06/24/2020    Lab Results  Component Value Date/Time   HGBA1C 4.9 11/30/2017 10:40 AM   HGBA1C 4.7 10/02/2016 09:52 AM   MICROALBUR 0.4 11/27/2016 09:00 AM    Last diabetic Eye exam: No results found for: HMDIABEYEEXA  Last diabetic Foot exam: No results found for: HMDIABFOOTEX   Lab Results  Component Value Date   CHOL 78 03/01/2020   HDL 40 03/01/2020   LDLCALC 27 03/01/2020   TRIG 37 03/01/2020   CHOLHDL 2.0 03/01/2020    Hepatic Function Latest Ref Rng & Units 05/07/2020 04/19/2020 03/01/2020  Total Protein 6.5  - 8.1 g/dL 6.3(L) 5.8(L) 5.5(L)  Albumin 3.5 - 5.0 g/dL 2.7(L) 2.7(L) -  AST 15 - 41 U/L 14(L) 83(H) 13  ALT 0 - 44 U/L 9 65(H) 15  Alk Phosphatase 38 - 126 U/L 93 82 -  Total Bilirubin 0.3 - 1.2 mg/dL 1.1 1.2 0.4    Lab Results  Component Value Date/Time   TSH 2.207 06/21/2018 10:12 AM   TSH 2.42 11/27/2016 09:00 AM   TSH 3.010 02/11/2015 02:04 PM    CBC Latest Ref Rng & Units 06/24/2020 06/14/2020 06/11/2020  WBC 3.8 - 10.8 Thousand/uL 3.7(L) 4.4 4.6  Hemoglobin 13.2 - 17.1 g/dL 10.1(L) 10.8(L) 10.9(L)  Hematocrit 38.5 - 50.0 % 29.3(L) 33.1(L) 31.8(L)  Platelets 140 - 400 Thousand/uL 136(L) 124(L) 140(L)    Lab Results  Component Value Date/Time   VD25OH 38 08/29/2019 11:07 AM   VD25OH 36 10/02/2016 09:52 AM    Clinical ASCVD: No  The ASCVD Risk score Mikey Bussing DC Jr., et al., 2013) failed to calculate for the following reasons:   The valid total cholesterol range is 130 to 320 mg/dL   Unable to determine if patient is Non-Hispanic African American    Depression screen [facility] 2/9 08/19/2020 05/18/2020 03/01/2020  Decreased Interest 0 0 0  Down, Depressed, Hopeless 0 0 2  PHQ - 2 Score 0 0 2  Altered sleeping - - 0  Tired, decreased energy - - 0  Change in appetite - - 0  Feeling bad or failure about yourself  - - 0  Trouble concentrating - - 0  Moving slowly or fidgety/restless - - 0  Suicidal thoughts - - 0  PHQ-9 Score - - 2  Difficult doing work/chores - - Somewhat difficult  Some recent data might be hidden    Social History   Tobacco Use  Smoking Status Never  Smokeless Tobacco Never   BP Readings from Last 3 Encounters:  06/14/20 (!) 116/97  05/18/20 126/82  05/17/20 (!) 90/58   Pulse Readings from Last 3 Encounters:  06/14/20 (!) 52  05/18/20 63  05/17/20 (!) 58   Wt Readings from Last 3 Encounters:  06/14/20 130 lb (59 kg)  05/18/20 138 lb (62.6 kg)  05/17/20 138 lb 7.2 oz (62.8 kg)   BMI Readings from Last 3 Encounters:  06/14/20 18.65 kg/m   05/18/20 19.80 kg/m  05/17/20 19.87 kg/m    Assessment/Interventions: Review of patient past medical history, allergies, medications, health status, including review of consultants reports, laboratory and other test data, was performed as part of comprehensive evaluation and provision of chronic care management services.   SDOH:  (Social Determinants of Health) assessments and interventions performed: Yes SDOH Interventions    Flowsheet Row Most Recent Value  SDOH Interventions   Financial Strain Interventions Intervention Not Indicated      SDOH Screenings   Alcohol Screen: Low Risk    Last Alcohol Screening Score (AUDIT): 0  Depression (PHQ2-9): Low Risk    PHQ-2 Score: 0  Financial Resource Strain: Low Risk    Difficulty of Paying Living Expenses: Not hard at all  Food Insecurity: No Food Insecurity   Worried About Charity fundraiser in the Last Year: Never true   Ran  Out of Food in the Last Year: Never true  Housing: Low Risk    Last Housing Risk Score: 0  Physical Activity: Inactive   Days of Exercise per Week: 0 days   Minutes of Exercise per Session: 0 min  Social Connections: Socially Isolated   Frequency of Communication with Friends and Family: Never   Frequency of Social Gatherings with Friends and Family: Never   Attends Religious Services: Never   Marine scientist or Organizations: No   Attends Music therapist: Never   Marital Status: Never married  Stress: No Stress Concern Present   Feeling of Stress : Not at all  Tobacco Use: Low Risk    Smoking Tobacco Use: Never   Smokeless Tobacco Use: Never  Transportation Needs: No Transportation Needs   Lack of Transportation (Medical): No   Lack of Transportation (Non-Medical): No    CCM Care Plan  No Known Allergies  Medications Reviewed Today     Reviewed by Clemetine Marker, LPN (Licensed Practical Nurse) on 08/19/20 at 1454  Med List Status: <None>   Medication Order Taking? Sig  Documenting Provider Last Dose Status Informant  acetaminophen (TYLENOL) 325 MG tablet [id number] Yes TAKE 2 TABLETS (650 MG TOTAL) BY MOUTH EVERY 6 HOURS AS NEEDED FOR MILD PAIN OR HEADACHE. Steele Sizer, MD Taking Active Nursing Home Medication Administration Guide (MAG)  alfuzosin (UROXATRAL) 10 MG 24 hr tablet [id number] Yes Take 1 tablet (10 mg total) by mouth daily. Steele Sizer, MD Taking Active Nursing Home Medication Administration Guide (MAG)  ALPRAZolam Duanne Moron) 0.25 MG tablet [id number] Yes TAKE ONE TABLET BY MOUTH 2 TIMES A DAY AS NEEDED FOR ANXIETY (WHEN BLOOD PRESSURE IS VERY HIGH OR ANXIOUS)  Patient taking differently: Take 0.25 mg by mouth 2 (two) times daily as needed for anxiety or sleep. TAKE ONE TABLET BY MOUTH 2 TIMES A DAY AS NEEDED FOR ANXIETY (WHEN BLOOD PRESSURE IS VERY HIGH OR ANXIOUS)   Steele Sizer, MD Taking Active   ARTIFICIAL TEARS 0.2-0.2-1 % SOLN [id number] Yes PLACE 2 DROPS INTO EYE 2 TIMES A DAY AS NEEDED Sowles, Drue Stager, MD Taking Active Nursing Home Medication Administration Guide (MAG)  ascorbic acid (VITAMIN C) 500 MG tablet [id number] Yes TAKE ONE TABLET BY MOUTH ONCE DAILY  Patient taking differently: Take 500 mg by mouth daily.   Steele Sizer, MD Taking Active   aspirin (ASPIR-LOW) 81 MG EC tablet [id number] No TAKE 1 TABLET BY MOUTH ONCE DAILY  Patient not taking: Reported on 08/19/2020   Steele Sizer, MD Not Taking Active   atorvastatin (LIPITOR) 40 MG tablet [id number] Yes Take 1 tablet (40 mg total) by mouth every evening. Steele Sizer, MD Taking Active Nursing Home Medication Administration Guide (MAG)  augmented betamethasone dipropionate (DIPROLENE-AF) 0.05 % cream [id number] Yes APPLY TOPICALLY AS DIRECTED TO RASH AS NEEDED Steele Sizer, MD Taking Active Nursing Home Medication Administration Guide (MAG)  Calcium Carb-Cholecalciferol (CALCIUM-VITAMIN D) 500-400 MG-UNIT TABS [id number] Yes TAKE 1 TABLET BY MOUTH 2 TIMES A Eloise Harman, MD Taking Active   Cranberry Juice Powder 425 MG CAPS [id number] Yes TAKE 1 CAPSULE BY MOUTH ONCE DAILY Steele Sizer, MD Taking Active   Elastic Bandages & Supports (MEDICAL COMPRESSION STOCKINGS) [place] [id number] Yes 1 Units by Does not apply route daily. Steele Sizer, MD Taking Active Nursing Home Medication Administration Guide (MAG)  Incontinence Supply Disposable (DEPEND SILHOUETTE BRIEFS L/XL) MISC [id number] Yes 1 each by Does not apply route 2 (two) times daily as needed.  Steele Sizer, MD Taking Active Nursing Home Medication Administration Guide (MAG)  Iron-FA-B Cmp-C-Biot-Probiotic (FUSION PLUS) CAPS [id number] Yes Take 1 tablet by mouth daily. Lin Landsman, MD Taking Active   lubiprostone (AMITIZA) 24 MCG capsule [id number] Yes TAKE 1 CAPSULE BY MOUTH 2 TIMES DAILY WITH A MEAL  Patient taking differently: Take 24 mcg by mouth 2 (two) times daily with a meal. TAKE 1 CAPSULE BY MOUTH 2 TIMES DAILY WITH A MEAL   Sowles, Drue Stager, MD Taking Active   Nutritional Supplements (ENSURE NUTRITION SHAKE) LIQD [id number] Yes Take 1 each by mouth 2 (two) times daily. Steele Sizer, MD Taking Active Nursing Home Medication Administration Guide (MAG)  Polyethyl Glycol-Propyl Glycol (SYSTANE ULTRA) 0.4-0.3 % SOLN [id number] Yes Apply 1 drop to eye 2 (two) times daily. Both eyes Steele Sizer, MD Taking Active Nursing Home Medication Administration Guide (MAG)  sertraline (ZOLOFT) 100 MG tablet [id number] Yes Take 1 tablet (100 mg total) by mouth daily. Steele Sizer, MD Taking Active Nursing Home Medication Administration Guide (MAG)  Med List Note Bridgette Habermann, CPhT 08/01/17 1146): [phone] [facility]            Patient Active Problem List   Diagnosis Date Noted   Acute blood loss anemia 05/09/2020   Rectal bleeding 05/09/2020   Hematochezia    HCAP (healthcare-associated pneumonia) 05/07/2020   BPH (benign prostatic hyperplasia)    Hyperlipidemia     Depression with anxiety    Acute metabolic encephalopathy    Protein-calorie malnutrition, severe 04/22/2020   Pressure injury of skin 04/22/2020   Multifocal pneumonia    Sepsis due to undetermined organism ([place]) 04/19/2020   Personal history of urinary calculi 06/21/2019   PAD (peripheral artery disease) ([place]) 07/04/2017   Aortic valve calcification 07/04/2017   Coronary artery calcification 07/04/2017   Late effect acute polio 05/17/2016   Chronic constipation 11/19/2015   Mild major depression ([place]) 11/19/2015   Bacteriuria, chronic 09/11/2015   History of prostate cancer 03/02/2015   Amyotrophia 03/02/2015   Edema leg 03/02/2015   Cerebral palsy ([place]) 03/02/2015   Dyslipidemia 03/02/2015   Dermatitis, eczematoid 03/02/2015   Divergent squint 03/02/2015   H/O acute poliomyelitis 03/02/2015   Lymphedema 03/02/2015   Intellectual disability 03/02/2015   Hypertrophy of nail 03/02/2015   Chronic venous insufficiency 03/02/2015   Avitaminosis D 03/02/2015   Adynamia 03/02/2015   Dependent on wheelchair 03/02/2015   Renal cysts, acquired, bilateral 03/02/2015   At risk for falling 03/02/2015   Cerebral vascular disease 03/02/2015   Strabismus 02/11/2015   Urge incontinence of urine 02/11/2015   Wheelchair dependence 02/11/2015   Urinary frequency 10/30/2014    Immunization History  Administered Date(s) Administered   Fluad Quad(high Dose 65+) 03/01/2020   Influenza, High Dose Seasonal PF 02/11/2015, 02/14/2016, 02/14/2017, 02/19/2018, 01/27/2019, 01/20/2020   Influenza,inj,Quad PF,6+ Mos 02/04/2013   Influenza-Unspecified 12/31/2013, 02/11/2015   Moderna Sars-Covid-2 Vaccination 06/24/2019, 07/26/2019   Pneumococcal Conjugate-13 06/22/2014, 06/22/2014   Pneumococcal Polysaccharide-23 09/15/2009, 09/15/2009   Tdap 09/15/2009, 09/15/2009, 01/20/2020    Conditions to be addressed/monitored:  Hyperlipidemia, Coronary Artery Disease, Depression, Anxiety, and BPH  Care  Plan : General Pharmacy (Adult)  Updates made by Germaine Pomfret, RPH since 10/21/2020 12:00 AM     Problem: Hyperlipidemia, Coronary Artery Disease, Depression, Anxiety, and BPH   Priority: High     Long-Range Goal: Patient-Specific Goal   Start Date: 10/21/2020  Expected End Date: 04/22/2021  This Visit's Progress: On track  Priority: High  Note:   Current Barriers:  No barriers noted  Pharmacist Clinical Goal(s):  Patient will maintain control of cholesterol as evidenced by LDL less than 70  through collaboration with PharmD and provider.   Interventions: 1:1 collaboration with Steele Sizer, MD regarding development and update of comprehensive plan of care as evidenced by provider attestation and co-signature Inter-disciplinary care team collaboration (see longitudinal plan of care) Comprehensive medication review performed; medication list updated in electronic medical record  Hyperlipidemia: (LDL goal < 70) -Controlled -History of CAD, PAD  -Current treatment: Atorvastatin 40 mg daily  -Current treatment: Aspirin 81 mg daily  -Medications previously tried: NA  -Previously had instances of rectal bleeding  -Recommended to continue current medication  Depression/Anxiety (Goal: Maintain stable mood) -Not ideally controlled -Current treatment: Alprazolam 0.25 mg twice daily  Sertraline 100 mg daily  -Medications previously tried/failed: NA -"Flares up" about once weekly  -Recommended to continue current medication  BPH (Goal: Maintain symptom control) -Controlled -Current treatment  Alfuzosin 10 mg daily  -Medications previously tried: NA  -Recommended to continue current medication  Patient Goals/Self-Care Activities Patient will:  - take medications as prescribed  Follow Up Plan: Telephone follow up appointment with care management team member scheduled for:  04/06/2021 at 9:00 AM      Medication Assistance: None required.  Patient affirms current  coverage meets needs.  Compliance/Adherence/Medication fill history: Care Gaps: Shingrix Covid booster  Star-Rating Drugs: Atorvastatin 40 mg last filled on 09/28/2020 for 30 day supply at [facility].  Patient's preferred pharmacy is:  [facility], [place]. [address] [place] [address] Phone: [phone] Fax: [phone]  Uses pill box? Yes Pt endorses 100% compliance  We discussed: Current pharmacy is preferred with insurance plan and patient is satisfied with pharmacy services Patient decided to: Continue current medication management strategy  Care Plan and Follow Up Patient Decision:  Patient agrees to Care Plan and Follow-up.  Plan: Telephone follow up appointment with care management team member scheduled for:  04/06/2021 at 9:00 AM  Doristine Section, [facility] [phone]

## 2020-11-26 ENCOUNTER — Other Ambulatory Visit: Payer: Self-pay | Admitting: Gastroenterology

## 2020-12-28 ENCOUNTER — Other Ambulatory Visit: Payer: Self-pay | Admitting: Family Medicine

## 2020-12-28 DIAGNOSIS — K5909 Other constipation: Secondary | ICD-10-CM

## 2020-12-28 DIAGNOSIS — N390 Urinary tract infection, site not specified: Secondary | ICD-10-CM

## 2020-12-28 DIAGNOSIS — R350 Frequency of micturition: Secondary | ICD-10-CM

## 2020-12-28 DIAGNOSIS — I700 Atherosclerosis of aorta: Secondary | ICD-10-CM

## 2020-12-28 DIAGNOSIS — F320 Major depressive disorder, single episode, mild: Secondary | ICD-10-CM

## 2020-12-28 DIAGNOSIS — E441 Mild protein-calorie malnutrition: Secondary | ICD-10-CM

## 2021-01-12 ENCOUNTER — Telehealth: Payer: Self-pay

## 2021-01-12 NOTE — Progress Notes (Signed)
Chronic Care Management Pharmacy Assistant   Name: [name]  MRN: [id number] DOB: 16-Feb-1941  Reason for Encounter: General Disease State Call   Recent office visits:  None ID  Recent consult visits:  None ID   Hospital visits:  None in previous 6 months  Medications: Outpatient Encounter Medications as of 01/12/2021  Medication Sig   alfuzosin (UROXATRAL) 10 MG 24 hr tablet TAKE 1 TABLET BY MOUTH DAILY   atorvastatin (LIPITOR) 40 MG tablet TAKE 1 TABLET BY MOUTH EVERY EVENING   Calcium Carb-Cholecalciferol (CALCIUM-VITAMIN D) 500-400 MG-UNIT TABS TAKE 1 TABLET BY MOUTH 2 TIMES A DAY   Cranberry Juice Powder 425 MG CAPS TAKE 1 CAPSULE BY MOUTH ONCE DAILY   lubiprostone (AMITIZA) 24 MCG capsule Take 1 capsule (24 mcg total) by mouth 2 (two) times daily with a meal.   sertraline (ZOLOFT) 100 MG tablet TAKE 1 TABLET BY MOUTH DAILY   acetaminophen (TYLENOL) 325 MG tablet TAKE 2 TABLETS (650 MG TOTAL) BY MOUTH EVERY 6 HOURS AS NEEDED FOR MILD PAIN OR HEADACHE.   ALPRAZolam (XANAX) 0.25 MG tablet TAKE ONE TABLET BY MOUTH 2 TIMES A DAY AS NEEDED FOR ANXIETY (WHEN BLOOD PRESSURE IS VERY HIGH OR ANXIOUS) (Patient taking differently: Take 0.25 mg by mouth 2 (two) times daily as needed for anxiety or sleep. TAKE ONE TABLET BY MOUTH 2 TIMES A DAY AS NEEDED FOR ANXIETY (WHEN BLOOD PRESSURE IS VERY HIGH OR ANXIOUS))   augmented betamethasone dipropionate (DIPROLENE-AF) 0.05 % cream APPLY TOPICALLY AS DIRECTED TO RASH AS NEEDED   Elastic Bandages & Supports (MEDICAL COMPRESSION STOCKINGS) MISC 1 Units by Does not apply route daily.   hydroxypropyl methylcellulose / hypromellose (ISOPTO TEARS / GONIOVISC) 2.5 % ophthalmic solution Place 1 drop into both eyes 3 (three) times daily as needed for dry eyes.   Incontinence Supply Disposable (DEPEND SILHOUETTE BRIEFS L/XL) MISC 1 each by Does not apply route 2 (two) times daily as needed.   Iron-FA-B Cmp-C-Biot-Probiotic (FUSION PLUS) CAPS TAKE  1 CAPSULE BY MOUTH DAILY   Nutritional Supplements (ENSURE ORIGINAL) LIQD DRINK 1 CAN 2 TIMES A DAY   SYSTANE ULTRA 0.4-0.3 % SOLN PLACE 1 DROP INTO BOTH EYES 2 TIMES DAILY AS NEEDED   vitamin C (ASCORBIC ACID) 500 MG tablet Take 1 tablet (500 mg total) by mouth daily.   No facility-administered encounter medications on file as of 01/12/2021.   Care Gaps: Zoster Vaccines COVID-19 Vaccine Booster 3 Influenza Vaccine (last completed 03/01/2020)  Star Rating Drugs: Atorvastatin 40 mg last filled on 12/28/2020 for a 30-Day supply with [facility]  Have their been any changes to your health? Per patient's caregiver [name] patient has no changes to his health at this time he is actually doing great.   2.    Are you having any issues with getting medications from your pharmacy?  No issues at this time.  3.    Are you experiencing  any side effects from your medications? Per caregiver not that she is aware of.  4.     Are you still having a gradual decline in your weight? Per caregiver she reports that the patient is actually           Gaining weight. She states that he is gaining weight gradually but he is gaining.   5.     Are you still having out burst 1-2 times weekly? Per caregiver this has improved and he is not having as many  Outburst as he was having in the past.   6.     Is there anything you would like me to pass along to [facility] or Dr. Ancil Boozer? Yes, the patient currently has a          prescription for Ensure and currently to prescription is for twice a day. Patient's caregiver is requesting that the         prescription be changed to three times daily. She reports that the patient does eat pureed foods, but she feels          that he needs more nutrients and that an additional ensure a day will help.    Message sent to [name], CPP regarding the Ensure prescription to see if he can write the prescription   for the additional Ensure or would I need to contact Dr.  Ancil Boozer to have this done.     Per Cristie Hem he can not change this prescription and he requested that I contact the clinic to request the change   with this prescription.    Spoke with Ja-Kwan at the providers office, and he is going to send a message back with the request   to send a new prescription over to [facility] for the patient's Ensure to increase it to three times  daily.   7.     Do you need any refills on your medications today? Not that the caregiver is aware of at this time.    Patient has future appointment with [name], CPP on 04/06/2021 @ 0900.    Lynann Bologna, CPA/CMA Clinical Pharmacist Assistant Phone: [phone]

## 2021-01-18 ENCOUNTER — Other Ambulatory Visit: Payer: Self-pay

## 2021-01-18 ENCOUNTER — Telehealth: Payer: Self-pay

## 2021-01-18 DIAGNOSIS — E441 Mild protein-calorie malnutrition: Secondary | ICD-10-CM

## 2021-01-18 MED ORDER — ENSURE ORIGINAL PO LIQD: 1.0000 | Freq: Three times a day (TID) | ORAL | 5 refills | Status: DC

## 2021-01-18 NOTE — Telephone Encounter (Signed)
Copied from [place] [phone]. Topic: General - Other >> Jan 18, 2021  1:26 PM Yvette Rack wrote: Reason for CRM: Aniceto Boss the assistant for Cristie Hem stated she spoke with pt caregiver and requests Rx for Nutritional Supplements (ENSURE ORIGINAL) LIQD be increased to 3 times daily. Tasha asked that the Rx be sent to [facility]. Cb# [phone]

## 2021-02-25 ENCOUNTER — Other Ambulatory Visit: Payer: Self-pay | Admitting: Gastroenterology

## 2021-03-24 NOTE — Progress Notes (Signed)
03/25/2021 11:10 AM   [name] 03/13/1941 [id number]  Referring provider: Steele Sizer, MD [address] [place],  [place] [address]  Chief Complaint  Patient presents with   Follow-up    Foul smelling urine, kidney stone   Urological history 1.  History T1c intermediate risk prostate cancer -Gleason 3+4 diagnosed 2010 -Treated with brachytherapy 11/10/2008 -Last PSA 10/2015 <0.1   2.  Chronic bacteriuria -Cystoscopy Dr. Elnoria Howard 2015 unremarkable, nonocclusive prostate or stricture   3.  Stone disease - CT 2019 nonobstructing right mid ureteral calculus -Ureteroscopic stone removal 08/14/2016   4.  Renal cysts -CTU 2019 bilateral renal cysts with 2 indeterminate right renal lesions (18 mm interpolar left kidney and 15 mm anterior right kidney) -MRI 07/2017 showed the left renal lesion was not present and was a perfusion anomaly; right renal lesion a Bosniak 2 cyst    HPI: [name] is a 80 y.o. male who presents today for foul smelling urine and passing two stones.   Cath UA nitrite positive, greater than 30 WBCs and many bacteria  PVR 122 mL  KUB large renal stone.     Patient is unable to give history.  History through caregiver.    Patient's caregiver stated that he had been yelling out, but that is not unusual behavior for him.  She then noted that afternoon when changing the depends there was a stone.  She has not noted any fever, vomiting or loss of appetite.  She has not noted him complaining of any back pain or flank pain.  She did note that his testicle was a bit more tender during his bath today.  Patient denies any modifying or aggravating factors.  Patient denies any gross hematuria, dysuria or suprapubic/flank pain.  Patient denies any fevers, chills, nausea or vomiting.    PMH: Past Medical History:  Diagnosis Date   Anxiety    Atrophy, disuse, muscle    BPH (benign prostatic hyperplasia)    Cerebral palsy (HCC)    Eczema     Elevated lipids    Elevated PSA    Exotropia    Hyperlipidemia    Kidney stone    Leukopenia    Lower extremity edema    lymphedema   Mental retardation    Over weight    Prostate cancer ([place])    Venous insufficiency    Vitamin D deficiency    Wheelchair dependence     Surgical History: Past Surgical History:  Procedure Laterality Date   COLONOSCOPY WITH PROPOFOL N/A 05/10/2020   Procedure: COLONOSCOPY WITH PROPOFOL;  Surgeon: Lin Landsman, MD;  Location: [facility] ENDOSCOPY;  Service: Gastroenterology;  Laterality: N/A;   COLONOSCOPY WITH PROPOFOL N/A 05/12/2020   Procedure: COLONOSCOPY WITH PROPOFOL;  Surgeon: Lin Landsman, MD;  Location: [facility] ENDOSCOPY;  Service: Gastroenterology;  Laterality: N/A;   CYSTOSCOPY/URETEROSCOPY/HOLMIUM LASER/STENT PLACEMENT Right 08/14/2017   Procedure: CYSTOSCOPY/URETEROSCOPY/HOLMIUM LASER/STENT PLACEMENT;  Surgeon: Abbie Sons, MD;  Location: [facility] ORS;  Service: Urology;  Laterality: Right;   ESOPHAGOGASTRODUODENOSCOPY (EGD) WITH PROPOFOL N/A 05/12/2020   Procedure: ESOPHAGOGASTRODUODENOSCOPY (EGD) WITH PROPOFOL;  Surgeon: Lin Landsman, MD;  Location: [facility] ENDOSCOPY;  Service: Gastroenterology;  Laterality: N/A;   leg circulation surgery Right    PROSTATE SURGERY  11/10/2008   BRACHYTHERAPY    Home Medications:  Allergies as of 03/25/2021   No Known Allergies      Medication List        Accurate as of March 25, 2021 11:59 PM. If you have any questions, ask your nurse or doctor.          STOP taking these medications    Systane Ultra 0.4-0.3 % Soln Generic drug: Polyethyl Glycol-Propyl Glycol Stopped by: Loistine Chance, MD       TAKE these medications    acetaminophen 325 MG tablet Commonly known as: TYLENOL TAKE 2 TABLETS (650 MG TOTAL) BY MOUTH EVERY 6 HOURS AS NEEDED FOR MILD PAIN OR HEADACHE.   alfuzosin 10 MG 24 hr tablet Commonly known as: UROXATRAL TAKE 1 TABLET BY MOUTH DAILY    ALPRAZolam 0.25 MG tablet Commonly known as: XANAX TAKE ONE TABLET BY MOUTH 2 TIMES A DAY AS NEEDED FOR ANXIETY (WHEN BLOOD PRESSURE IS VERY HIGH OR ANXIOUS)   Artificial Tears 0.2-0.2-1 % Soln Generic drug: Glycerin-Hypromellose-PEG 400 PLACE 2 DROPS INTO EYE 2 TIMES A DAY AS NEEDED Started by: Loistine Chance, MD   atorvastatin 40 MG tablet Commonly known as: LIPITOR TAKE 1 TABLET BY MOUTH EVERY EVENING   augmented betamethasone dipropionate 0.05 % cream Commonly known as: DIPROLENE-AF APPLY TOPICALLY AS DIRECTED TO RASH AS NEEDED   Calcium Carb-Cholecalciferol 500-10 MG-MCG Tabs TAKE 1 TABLET BY MOUTH 2 TIMES A DAY What changed: medication strength Changed by: Loistine Chance, MD   Cranberry Juice Powder 425 MG Caps TAKE 1 CAPSULE BY MOUTH ONCE DAILY   Depend Silhouette Briefs L/XL Misc 1 each by Does not apply route 2 (two) times daily as needed.   Ensure Original Liqd Take 1 Can by mouth 3 (three) times daily.   Fusion Plus Caps TAKE 1 CAPSULE BY MOUTH DAILY   hydroxypropyl methylcellulose / hypromellose 2.5 % ophthalmic solution Commonly known as: ISOPTO TEARS / GONIOVISC Place 1 drop into both eyes 3 (three) times daily as needed for dry eyes.   lubiprostone 24 MCG capsule Commonly known as: AMITIZA Take 1 capsule (24 mcg total) by mouth 2 (two) times daily with a meal.   Medical Compression Stockings Misc 1 Units by Does not apply route daily.   sertraline 100 MG tablet Commonly known as: ZOLOFT TAKE 1 TABLET BY MOUTH DAILY   sulfamethoxazole-trimethoprim 800-160 MG tablet Commonly known as: BACTRIM DS Take 1 tablet by mouth every 12 (twelve) hours. Started by: Zara Council, PA-C   vitamin C 500 MG tablet Commonly known as: ASCORBIC ACID Take 1 tablet (500 mg total) by mouth daily.        Allergies: No Known Allergies  Family History: Family History  Problem Relation Age of Onset   Heart attack Brother    Heart disease Other      Social History:  reports that he has never smoked. He has never used smokeless tobacco. He reports that he does not drink alcohol and does not use drugs.  ROS: Pertinent ROS in HPI  Physical Exam: Ht 5\' 10"  (1.778 m)   Wt 135 lb (61.2 kg)   BMI 19.37 kg/m   Constitutional:  Well nourished. Alert and oriented, No acute distress. HEENT: [place] AT, moist mucus membranes.  Trachea midline, no masses. Cardiovascular: No clubbing, cyanosis, or edema. Respiratory: Normal respiratory effort, no increased work of breathing. GU: No CVA tenderness.  No bladder fullness or masses.  Patient with uncircumcised phallus.  Foreskin easily retracted  Urethral meatus is patent.  No penile discharge. No penile lesions or rashes. Scrotum without lesions, cysts, rashes and/or edema.  Testicles are located scrotally bilaterally. No masses are appreciated in the testicles.  Left and right epididymis are normal.  Left epididymis is indurated and tender.  Neurologic: Grossly intact, no focal deficits, moving all 4 extremities. Psychiatric: Normal mood and affect.  Laboratory Data: Lab Results  Component Value Date   WBC 3.7 (L) 06/24/2020   HGB 10.1 (L) 06/24/2020   HCT 29.3 (L) 06/24/2020   MCV 91.6 06/24/2020   PLT 136 (L) 06/24/2020    Lab Results  Component Value Date   CREATININE 0.61 (L) 06/24/2020   Lab Results  Component Value Date   AST 14 (L) 05/07/2020   Lab Results  Component Value Date   ALT 9 05/07/2020    Urinalysis Component     Latest Ref Rng & Units 03/25/2021          Specific Gravity, UA     1.005 - 1.030 1.020  pH, UA     5.0 - 7.5 7.5  Color, UA     Yellow Yellow  Appearance Ur     Clear Cloudy (A)  Leukocytes,UA     Negative 3+ (A)  Protein,UA     Negative/Trace 2+ (A)  Glucose, UA     Negative Negative  Ketones, UA     Negative Negative  RBC, UA     Negative 2+ (A)  Bilirubin, UA     Negative Negative  Urobilinogen, Ur     0.2 - 1.0 mg/dL 0.2   Nitrite, UA     Negative Positive (A)  Microscopic Examination      See below:   Component     Latest Ref Rng & Units 03/25/2021  WBC, UA     0 - 5 /hpf >30 (A)  RBC     0 - 2 /hpf None seen  Epithelial Cells (non renal)     0 - 10 /hpf 0-10  Renal Epithel, UA     None seen /hpf None seen  Bacteria, UA     None seen/Few Many (A)  I have reviewed the labs.   Pertinent Imaging:  03/25/21 11:26  Scan Result 163ml  CLINICAL DATA:  Renal stones   EXAM: ABDOMEN - 1 VIEW   COMPARISON:  10/16/2018   FINDINGS: Large amount of stool is seen in the colon and rectum. There is evidence of previous brachytherapy in the prostate. No abnormal masses or calcifications are seen. Kidneys are mostly obscured by bowel contents. Osteopenia is seen in bony structures.   IMPRESSION: Large amount of stool is seen in colon with possible fecal impaction in the rectum. There is no evidence of small-bowel obstruction.     Electronically Signed   By: Elmer Picker M.D.   On: 03/28/2021 15:50  I have independently reviewed the films.    Assessment & Plan:    1. Nephrolithiasis -Large right renal stone appreciated on today's KUB -We will get patient scheduled for CT renal stone study for the next few days to evaluate for stone burden and hydronephrosis as patient is a limited historian and UA is grossly infected -Reviewed red flag signs with caregiver and instructed her to contact our office or seek treatment in the ED if she should witness any of the symptoms  2. UTI -UA -urine culture -Septra DS, BID x 14 days  3. Left epididymitis  -please do not put depends on in a tight fitting manner -start Septra DS, BID x 14 days -Reviewed red flag signs with caregiver  Return in about 1 week (around 04/01/2021) for CT report .  These  notes generated with voice recognition software. I apologize for typographical errors.  Zara Council, PA-C  [facility] [address]  [place], [place] [address] [phone]

## 2021-03-25 ENCOUNTER — Encounter: Payer: Self-pay | Admitting: Urology

## 2021-03-25 ENCOUNTER — Other Ambulatory Visit: Payer: Self-pay | Admitting: Family Medicine

## 2021-03-25 ENCOUNTER — Other Ambulatory Visit: Payer: Self-pay | Admitting: *Deleted

## 2021-03-25 ENCOUNTER — Ambulatory Visit
Admission: RE | Admit: 2021-03-25 | Discharge: 2021-03-25 | Disposition: A | Discharge status: Home / Self Care | Payer: Medicare Other | Attending: Urology | Admitting: Urology

## 2021-03-25 ENCOUNTER — Other Ambulatory Visit: Payer: Self-pay

## 2021-03-25 ENCOUNTER — Ambulatory Visit (INDEPENDENT_AMBULATORY_CARE_PROVIDER_SITE_OTHER): Payer: Medicare Other | Admitting: Urology

## 2021-03-25 ENCOUNTER — Ambulatory Visit
Admission: RE | Admit: 2021-03-25 | Discharge: 2021-03-25 | Disposition: A | Discharge status: Home / Self Care | Payer: Medicare Other | Source: Ambulatory Visit | Attending: Urology | Admitting: Urology

## 2021-03-25 VITALS — Ht 70.0 in | Wt 135.0 lb

## 2021-03-25 DIAGNOSIS — Z87442 Personal history of urinary calculi: Secondary | ICD-10-CM | POA: Insufficient documentation

## 2021-03-25 DIAGNOSIS — N200 Calculus of kidney: Secondary | ICD-10-CM

## 2021-03-25 DIAGNOSIS — E441 Mild protein-calorie malnutrition: Secondary | ICD-10-CM

## 2021-03-25 DIAGNOSIS — N390 Urinary tract infection, site not specified: Secondary | ICD-10-CM

## 2021-03-25 LAB — URINALYSIS, COMPLETE
Bilirubin, UA: NEGATIVE
Glucose, UA: NEGATIVE
Ketones, UA: NEGATIVE
Nitrite, UA: POSITIVE — AB
Specific Gravity, UA: 1.02 (ref 1.005–1.030)
Urobilinogen, Ur: 0.2 mg/dL (ref 0.2–1.0)
pH, UA: 7.5 (ref 5.0–7.5)

## 2021-03-25 LAB — MICROSCOPIC EXAMINATION
RBC, Urine: NONE SEEN /hpf (ref 0–2)
Renal Epithel, UA: NONE SEEN /hpf
WBC, UA: >30 /hpf — AB (ref 0–5)

## 2021-03-25 LAB — BLADDER SCAN AMB NON-IMAGING

## 2021-03-25 MED ORDER — SULFAMETHOXAZOLE-TRIMETHOPRIM 800-160 MG PO TABS: 1.0000 | ORAL_TABLET | Freq: Two times a day (BID) | ORAL | 0 refills | Status: DC

## 2021-03-26 NOTE — Telephone Encounter (Signed)
Requested medication (s) are due for refill today: yes  Requested medication (s) are on the active medication list: yes  Last refill:  12/28/20 #180  Future visit scheduled: no  Notes to clinic:  med not assigned to a protocol   Requested Prescriptions  Pending Prescriptions Disp Refills   Calcium Carb-Cholecalciferol 500-10 MG-MCG TABS [Pharmacy Med Name: CALCIUM-VITAMIN D 500-400 MG-UNIT T] 180 tablet 0    Sig: TAKE 1 TABLET BY MOUTH 2 TIMES A DAY     There is no refill protocol information for this order

## 2021-03-28 ENCOUNTER — Other Ambulatory Visit: Payer: Self-pay

## 2021-03-28 ENCOUNTER — Ambulatory Visit
Admission: RE | Admit: 2021-03-28 | Discharge: 2021-03-28 | Disposition: A | Discharge status: Home / Self Care | Payer: Medicare Other | Source: Ambulatory Visit | Attending: Urology | Admitting: Urology

## 2021-03-28 DIAGNOSIS — N200 Calculus of kidney: Secondary | ICD-10-CM | POA: Diagnosis not present

## 2021-03-28 DIAGNOSIS — K3189 Other diseases of stomach and duodenum: Secondary | ICD-10-CM | POA: Diagnosis not present

## 2021-03-28 DIAGNOSIS — K6389 Other specified diseases of intestine: Secondary | ICD-10-CM | POA: Diagnosis not present

## 2021-03-28 DIAGNOSIS — N281 Cyst of kidney, acquired: Secondary | ICD-10-CM | POA: Diagnosis not present

## 2021-03-28 DIAGNOSIS — N390 Urinary tract infection, site not specified: Secondary | ICD-10-CM | POA: Diagnosis not present

## 2021-03-30 LAB — CALCULI, WITH PHOTOGRAPH (CLINICAL LAB)
Carbonate Apatite: 30 %
Mg NH4 PO4 (Struvite): 70 %
Weight Calculi: 566 mg

## 2021-03-31 NOTE — Progress Notes (Signed)
04/01/2021 8:48 AM   [name] Dec 23, 1940 [id number]  Referring provider: Steele Sizer, MD [address] [place],  [place] [address]  Chief Complaint  Patient presents with   Results    Urological history 1.  History T1c intermediate risk prostate cancer -Gleason 3+4 diagnosed 2010 -Treated with brachytherapy 11/10/2008 -Last PSA 10/2015 <0.1   2.  Chronic bacteriuria -Cystoscopy Dr. Elnoria Howard 2015 unremarkable, nonocclusive prostate or stricture   3.  Stone disease - CT 2019 nonobstructing right mid ureteral calculus -Ureteroscopic stone removal 08/14/2016   4.  Renal cysts -CTU 2019 bilateral renal cysts with 2 indeterminate right renal lesions (18 mm interpolar left kidney and 15 mm anterior right kidney) -MRI 07/2017 showed the left renal lesion was not present and was a perfusion anomaly; right renal lesion a Bosniak 2 cyst -CTU 2022 - There is 1.9 cm smooth marginated low-density lesion in the midportion of left kidney. There is 2.2 cm cyst in the anterior midportion of right kidney. There is 2.3 cm exophytic lesion in the anterior midportion of right kidney, possibly cyst   HPI: PATRICIA PERALES is a 80 y.o. male who presents today for CT report and recheck on epididymitis with caregiver, Scientist, clinical (histocompatibility and immunogenetics).   Stone composition 70% Struvite and 30% Carbonate Apatite.   CT renal stone study 03/28/2021 multiple bilateral nonobstructing renal stones, more so in the right kidney. There is no evidence of intestinal obstruction or pneumoperitoneum. Possible bilateral renal cysts.  Patient is unable to give history.  History is obtained through caregiver.  She states that he is no longer expressing pain when changing depends and they have noticed the scrotal swelling has decreased.  Patient denies any modifying or aggravating factors.  Patient denies any gross hematuria, dysuria or suprapubic/flank pain.  Patient denies any fevers, chills, nausea or vomiting.      PMH: Past Medical History:  Diagnosis Date   Anxiety    Atrophy, disuse, muscle    BPH (benign prostatic hyperplasia)    Cerebral palsy (HCC)    Eczema    Elevated lipids    Elevated PSA    Exotropia    Hyperlipidemia    Kidney stone    Leukopenia    Lower extremity edema    lymphedema   Mental retardation    Over weight    Prostate cancer ([place])    Venous insufficiency    Vitamin D deficiency    Wheelchair dependence     Surgical History: Past Surgical History:  Procedure Laterality Date   COLONOSCOPY WITH PROPOFOL N/A 05/10/2020   Procedure: COLONOSCOPY WITH PROPOFOL;  Surgeon: Lin Landsman, MD;  Location: [facility] ENDOSCOPY;  Service: Gastroenterology;  Laterality: N/A;   COLONOSCOPY WITH PROPOFOL N/A 05/12/2020   Procedure: COLONOSCOPY WITH PROPOFOL;  Surgeon: Lin Landsman, MD;  Location: [facility] ENDOSCOPY;  Service: Gastroenterology;  Laterality: N/A;   CYSTOSCOPY/URETEROSCOPY/HOLMIUM LASER/STENT PLACEMENT Right 08/14/2017   Procedure: CYSTOSCOPY/URETEROSCOPY/HOLMIUM LASER/STENT PLACEMENT;  Surgeon: Abbie Sons, MD;  Location: [facility] ORS;  Service: Urology;  Laterality: Right;   ESOPHAGOGASTRODUODENOSCOPY (EGD) WITH PROPOFOL N/A 05/12/2020   Procedure: ESOPHAGOGASTRODUODENOSCOPY (EGD) WITH PROPOFOL;  Surgeon: Lin Landsman, MD;  Location: [facility] ENDOSCOPY;  Service: Gastroenterology;  Laterality: N/A;   leg circulation surgery Right    PROSTATE SURGERY  11/10/2008   BRACHYTHERAPY    Home Medications:  Allergies as of 04/01/2021   No Known Allergies      Medication List        Accurate as  of April 01, 2021  8:48 AM. If you have any questions, ask your nurse or doctor.          acetaminophen 325 MG tablet Commonly known as: TYLENOL TAKE 2 TABLETS (650 MG TOTAL) BY MOUTH EVERY 6 HOURS AS NEEDED FOR MILD PAIN OR HEADACHE.   alfuzosin 10 MG 24 hr tablet Commonly known as: UROXATRAL TAKE 1 TABLET BY MOUTH DAILY   ALPRAZolam 0.25 MG  tablet Commonly known as: XANAX TAKE ONE TABLET BY MOUTH 2 TIMES A DAY AS NEEDED FOR ANXIETY (WHEN BLOOD PRESSURE IS VERY HIGH OR ANXIOUS)   Artificial Tears 0.2-0.2-1 % Soln Generic drug: Glycerin-Hypromellose-PEG 400 PLACE 2 DROPS INTO EYE 2 TIMES A DAY AS NEEDED   atorvastatin 40 MG tablet Commonly known as: LIPITOR TAKE 1 TABLET BY MOUTH EVERY EVENING   augmented betamethasone dipropionate 0.05 % cream Commonly known as: DIPROLENE-AF APPLY TOPICALLY AS DIRECTED TO RASH AS NEEDED   Calcium Carb-Cholecalciferol 500-10 MG-MCG Tabs TAKE 1 TABLET BY MOUTH 2 TIMES A DAY   Cranberry Juice Powder 425 MG Caps TAKE 1 CAPSULE BY MOUTH ONCE DAILY   Depend Silhouette Briefs L/XL Misc 1 each by Does not apply route 2 (two) times daily as needed.   Ensure Original Liqd Take 1 Can by mouth 3 (three) times daily.   Fusion Plus Caps TAKE 1 CAPSULE BY MOUTH DAILY   hydroxypropyl methylcellulose / hypromellose 2.5 % ophthalmic solution Commonly known as: ISOPTO TEARS / GONIOVISC Place 1 drop into both eyes 3 (three) times daily as needed for dry eyes.   lubiprostone 24 MCG capsule Commonly known as: AMITIZA Take 1 capsule (24 mcg total) by mouth 2 (two) times daily with a meal.   Medical Compression Stockings Misc 1 Units by Does not apply route daily.   sertraline 100 MG tablet Commonly known as: ZOLOFT TAKE 1 TABLET BY MOUTH DAILY   sulfamethoxazole-trimethoprim 800-160 MG tablet Commonly known as: BACTRIM DS Take 1 tablet by mouth every 12 (twelve) hours.   vitamin C 500 MG tablet Commonly known as: ASCORBIC ACID Take 1 tablet (500 mg total) by mouth daily.        Allergies: No Known Allergies  Family History: Family History  Problem Relation Age of Onset   Heart attack Brother    Heart disease Other     Social History:  reports that he has never smoked. He has never used smokeless tobacco. He reports that he does not drink alcohol and does not use  drugs.  ROS: Pertinent ROS in HPI  Physical Exam: BP 112/62   Pulse 66   Ht 5\' 10"  (1.778 m)   BMI 19.37 kg/m   Constitutional:  Well nourished. Alert and oriented, No acute distress. HEENT: [place] AT, mask in place.  Trachea midline Cardiovascular: No clubbing, cyanosis, or edema. Respiratory: Normal respiratory effort, no increased work of breathing. GU: No CVA tenderness.  No bladder fullness or masses.  Patient with uncircumcised phallus. Foreskin easily retracted  Urethral meatus is patent.  No penile discharge. No penile lesions or rashes. Scrotum without lesions, cysts, rashes and/or edema.  Testicles are located scrotally bilaterally. No masses are appreciated in the testicles.  Right epididymis is normal.   Left testicle and epididymis are indurated.  There is no purulent drainage, fluctuance, or crepitus noted. Neurologic: Grossly intact, no focal deficits, moving all 4 extremities.  Arms contracted.  Non-ambulatory Psychiatric: Normal mood and affect.   Laboratory Data: Component Ref Range & Units 6 d  ago   Source Calculi  Comment   Comment: Not provided  Color Calculi  Green   Size Calculi mm 13x7   Comment: Multiple pieces received.  Dimensions of the largest piece  reported.   Weight Calculi mg 566   Composition Calculi  Comment   Comment: Percentage (Represents the % composition)  Carbonate Apatite % 30   Mg NH4 PO4 (Struvite) % 70   Comment Calculi 1  Comment   Comment: Calcium phosphate (carbonate form) includes carbonate  apatite and carbonated amorphous calcium phosphate.  Carbonated calcium phosphate is more commonly associated  with urinary infection.   Photo Calculi  Comment   Comment: Photograph will follow under a separate cover  Comment Calculi 3  Comment   Comment: Physician questions regarding Calculi Analysis contact  [facility] at: [phone].   Please Note:  Comment   Comment: Calculi report will follow via computer, mail or courier  delivery.    DISCLAIMER:  Comment   Comment: This test was developed and its performance characteristics  determined by [facility].  It has not been cleared or approved  by the Food and Drug Administration.   Resulting Agency  [facility]       Narrative Performed by: Maryan Puls Performed at:  [facility]  [address] [place], [place]  [id number]  Lab Director: Brock Ra PhD, Phone:  [phone]    Specimen Collected: 03/25/21 13:11 Last Resulted: 03/30/21 16:36         I have reviewed the labs.   Pertinent Imaging: CLINICAL DATA:  Flank pain, renal stones   EXAM: CT ABDOMEN AND PELVIS WITHOUT CONTRAST   TECHNIQUE: Multidetector CT imaging of the abdomen and pelvis was performed following the standard protocol without IV contrast.   COMPARISON:  05/10/2020   FINDINGS: Lower chest: Increased interstitial markings are seen in the posterior lower lung fields, more so on the right side. There is interval improvement in aeration of lower lung fields and interval clearing of small bilateral pleural effusions. Coronary artery calcifications are seen.   Hepatobiliary: No focal abnormality is seen in the liver. There is no dilation of bile ducts. Gallbladder is unremarkable.   Pancreas: No focal abnormality is seen.   Spleen: Unremarkable.   Adrenals/Urinary Tract: Adrenals are not enlarged. There is no hydronephrosis. There are multiple bilateral renal stones, more so on the right side. Largest of the stones is seen in the right renal pelvis measuring 12 mm. There is interval increase in number of left renal stones. There is 1.9 cm smooth marginated low-density lesion in the midportion of left kidney. There is 2.2 cm cyst in the anterior midportion of right kidney. There is 2.3 cm exophytic lesion in the anterior midportion of right kidney, possibly cyst. Ureters not dilated. Urinary bladder is not distended. There is mild diffuse wall thickening in the bladder.  There are metallic densities in the prostate suggesting previous brachytherapy.   Stomach/Bowel: Stomach is moderately distended. Small bowel loops are not dilated. Appendix is not distinctly seen. There is no significant wall thickening in colon. Large amount of stool is seen in the colon and rectum.   Vascular/Lymphatic: Unremarkable.   Reproductive: As described above   Other: There is no ascites or pneumoperitoneum.   Musculoskeletal: Anterolisthesis is seen at L4-L5 level.   IMPRESSION: Multiple bilateral nonobstructing renal stones, more so in the right kidney. There is no evidence of intestinal obstruction or pneumoperitoneum. Possible bilateral renal cysts.   Coronary artery  calcifications are seen. Increased interstitial markings are seen in the lower lung fields, more so on the right side, possibly suggesting scarring.   Other findings as described in the body of the report.     Electronically Signed   By: Elmer Picker M.D.   On: 03/28/2021 16:01  I have independently reviewed the films.  See HPI.   Assessment & Plan:    1. Bilateral Nephrolithiasis -CTU demonstrates bilateral nephrolithiasis -Stone analysis reveals struvite stone -At this time as there is no hydronephrosis noted on CT, patient does not complain of flank pain and serum creatinine is normal, surgical intervention is not warranted as it would likely be poorly tolerated and patient -I have instructed the staff to encourage Mr. Bruski to drink more fluids and continue the Septra DS -Once he has completed the Septra DS, we will need to place him on a low-dose antibiotic daily  2. UTI -UA -urine culture still pending -Septra DS, BID x 14 days  3. Left epididymitis  -please do not put depends on in a tight fitting manner -start Septra DS, BID x 14 days -Reviewed red flag signs with caregiver  Return in about 2 weeks (around 04/15/2021) for recheck.  These notes generated with voice  recognition software. I apologize for typographical errors.  Zara Council, PA-C  [facility] [address]  [place], [place] [address] [phone]

## 2021-04-01 ENCOUNTER — Encounter: Payer: Self-pay | Admitting: Urology

## 2021-04-01 ENCOUNTER — Other Ambulatory Visit: Payer: Self-pay

## 2021-04-01 ENCOUNTER — Ambulatory Visit (INDEPENDENT_AMBULATORY_CARE_PROVIDER_SITE_OTHER): Payer: Medicare Other | Admitting: Urology

## 2021-04-01 VITALS — BP 112/62 | HR 66 | Ht 70.0 in

## 2021-04-01 DIAGNOSIS — N451 Epididymitis: Secondary | ICD-10-CM | POA: Diagnosis not present

## 2021-04-01 DIAGNOSIS — N390 Urinary tract infection, site not specified: Secondary | ICD-10-CM

## 2021-04-01 DIAGNOSIS — N200 Calculus of kidney: Secondary | ICD-10-CM | POA: Diagnosis not present

## 2021-04-01 LAB — CULTURE, URINE COMPREHENSIVE

## 2021-04-01 NOTE — Patient Instructions (Signed)
Please encourage [name] to drink a lot of fluids.  The best fluids for him at this time are water, cranberry juice or even flavored water.  Please try to refrain from sodas.  Ideally, he should drink about a gallon of water daily.  This is to address his kidney stones and help him pass any fragments that may come loose.

## 2021-04-05 ENCOUNTER — Telehealth: Payer: Self-pay

## 2021-04-05 NOTE — Progress Notes (Signed)
° ° °  Chronic Care Management Pharmacy Assistant   Name: [name]  MRN: [id number] DOB: July 26, 1940  Patient's caregiver [name] called to reminded of his telephone appointment with [name], CPP on 12/14 @ 0900  Patient's caregiver aware of appointment date, time, and type of appointment (either telephone or in person). Patient aware to have/bring all medications, supplements, blood pressure and/or blood sugar logs to visit.  Questions: Are there any concerns you would like to discuss during your office visit? Nothing she can think of at this time  Star Rating Drug: Atorvastatin 40 mg last filled on 12/28/2020 for a 30-Day supply [facility]  Any gaps in medications fill history?  Care Gaps: [place] Vaccine COVID-19 Vaccine Booster 3 Influenza Vaccine   Lynann Bologna, CPA/CMA Clinical Pharmacist Assistant Phone: [phone]

## 2021-04-06 ENCOUNTER — Ambulatory Visit: Payer: Medicare Other

## 2021-04-06 DIAGNOSIS — F418 Other specified anxiety disorders: Secondary | ICD-10-CM

## 2021-04-06 DIAGNOSIS — E785 Hyperlipidemia, unspecified: Secondary | ICD-10-CM

## 2021-04-06 NOTE — Progress Notes (Signed)
Chronic Care Management Pharmacy Note  04/06/2021 Name:  [name] MRN:  [id number] DOB:  1941-01-19  Summary: Patient presents for CCM follow-up. Today's visit was conducted with [name] who provides 100% of the patient's history. She reports his appetite has improved significantly and he has been gradually regaining some of his weight. He continues to get adherence packages for his medications through his pharmacy.  Recommendations/Changes made from today's visit: Continue current medications.   Plan: CPP follow-up in 6 months  Patient instructed to schedule yearly physical with PCP.   Subjective: [name] is an 80 y.o. year old male who is a primary patient of Steele Sizer, MD.  The CCM team was consulted for assistance with disease management and care coordination needs.    Engaged with patient by telephone for follow up visit in response to provider referral for pharmacy case management and/or care coordination services.   Consent to Services:  The patient was given information about Chronic Care Management services, agreed to services, and gave verbal consent prior to initiation of services.  Please see initial visit note for detailed documentation.   Patient Care Team: Steele Sizer, MD as PCP - General (Family Medicine) Steele Sizer, MD (Family Medicine) Alvy Bimler, MD as Consulting Physician (Psychiatry) Abbie Sons, MD (Urology) Lorelee Cover., MD (Ophthalmology) Delana Meyer, Dolores Lory, MD (Vascular Surgery) Germaine Pomfret, [facility] (Pharmacist)  Recent office visits: 05/18/2020 Dr. Ancil Boozer MD (PCP) No medication changes noted.  Recent consult visits: 04/01/21: patient presented to Zara Council, PA-C for nephrolithiasis  03/25/21: patient presented to Zara Council, PA-C for nephrolithiasis  06/14/2020 Dr. Janese Banks (oncology) No Medication Changes noted 06/14/2020 Dr. Marius Ditch MD (Gastroenterology)  recommended to take oral iron 1  pill a day for 3 months.  Hospital visits: Admitted to the hospital on 05/07/2020 due to HCAP (healthcare-associated pnemonia). Discharge date was 05/17/2020. Discharged from [employer]?Medications Started at [facility] Discharge:?? -started Levaquin 750 mg daily for next 6 days for Pseudomonas bacteremia.    Medication Changes at Hospital Discharge: -Changed None   Medications Discontinued at Hospital Discharge: -Stopped None   Medications that remain the same after Hospital Discharge:?? -All other medications will remain the same.     Objective:  Lab Results  Component Value Date   CREATININE 0.61 (L) 06/24/2020   BUN 23 06/24/2020   GFRNONAA 96 06/24/2020   GFRAA 112 06/24/2020   NA 135 06/24/2020   K 3.9 06/24/2020   CALCIUM 8.8 06/24/2020   CO2 25 06/24/2020   GLUCOSE 156 (H) 06/24/2020    Lab Results  Component Value Date/Time   HGBA1C 4.9 11/30/2017 10:40 AM   HGBA1C 4.7 10/02/2016 09:52 AM   MICROALBUR 0.4 11/27/2016 09:00 AM    Last diabetic Eye exam: No results found for: HMDIABEYEEXA  Last diabetic Foot exam: No results found for: HMDIABFOOTEX   Lab Results  Component Value Date   CHOL 78 03/01/2020   HDL 40 03/01/2020   LDLCALC 27 03/01/2020   TRIG 37 03/01/2020   CHOLHDL 2.0 03/01/2020    Hepatic Function Latest Ref Rng & Units 05/07/2020 04/19/2020 03/01/2020  Total Protein 6.5 - 8.1 g/dL 6.3(L) 5.8(L) 5.5(L)  Albumin 3.5 - 5.0 g/dL 2.7(L) 2.7(L) -  AST 15 - 41 U/L 14(L) 83(H) 13  ALT 0 - 44 U/L 9 65(H) 15  Alk Phosphatase 38 - 126 U/L 93 82 -  Total Bilirubin 0.3 - 1.2 mg/dL 1.1 1.2  0.4    Lab Results  Component Value Date/Time   TSH 2.207 06/21/2018 10:12 AM   TSH 2.42 11/27/2016 09:00 AM   TSH 3.010 02/11/2015 02:04 PM    CBC Latest Ref Rng & Units 06/24/2020 06/14/2020 06/11/2020  WBC 3.8 - 10.8 Thousand/uL 3.7(L) 4.4 4.6  Hemoglobin 13.2 - 17.1 g/dL 10.1(L) 10.8(L) 10.9(L)  Hematocrit 38.5 - 50.0 % 29.3(L)  33.1(L) 31.8(L)  Platelets 140 - 400 Thousand/uL 136(L) 124(L) 140(L)    Lab Results  Component Value Date/Time   VD25OH 38 08/29/2019 11:07 AM   VD25OH 36 10/02/2016 09:52 AM    Clinical ASCVD: No  The ASCVD Risk score (Arnett DK, et al., 2019) failed to calculate for the following reasons:   The valid total cholesterol range is 130 to 320 mg/dL   Unable to determine if patient is Non-Hispanic African American    Depression screen [facility] 2/9 08/19/2020 05/18/2020 03/01/2020  Decreased Interest 0 0 0  Down, Depressed, Hopeless 0 0 2  PHQ - 2 Score 0 0 2  Altered sleeping - - 0  Tired, decreased energy - - 0  Change in appetite - - 0  Feeling bad or failure about yourself  - - 0  Trouble concentrating - - 0  Moving slowly or fidgety/restless - - 0  Suicidal thoughts - - 0  PHQ-9 Score - - 2  Difficult doing work/chores - - Somewhat difficult  Some recent data might be hidden    Social History   Tobacco Use  Smoking Status Never  Smokeless Tobacco Never   BP Readings from Last 3 Encounters:  04/01/21 112/62  06/14/20 (!) 116/97  05/18/20 126/82   Pulse Readings from Last 3 Encounters:  04/01/21 66  06/14/20 (!) 52  05/18/20 63   Wt Readings from Last 3 Encounters:  03/25/21 135 lb (61.2 kg)  06/14/20 130 lb (59 kg)  05/18/20 138 lb (62.6 kg)   BMI Readings from Last 3 Encounters:  04/01/21 19.37 kg/m  03/25/21 19.37 kg/m  06/14/20 18.65 kg/m    Assessment/Interventions: Review of patient past medical history, allergies, medications, health status, including review of consultants reports, laboratory and other test data, was performed as part of comprehensive evaluation and provision of chronic care management services.   SDOH:  (Social Determinants of Health) assessments and interventions performed: Yes SDOH Interventions    Flowsheet Row Most Recent Value  SDOH Interventions   Financial Strain Interventions Intervention Not Indicated       SDOH  Screenings   Alcohol Screen: Low Risk    Last Alcohol Screening Score (AUDIT): 0  Depression (PHQ2-9): Low Risk    PHQ-2 Score: 0  Financial Resource Strain: Low Risk    Difficulty of Paying Living Expenses: Not hard at all  Food Insecurity: No Food Insecurity   Worried About Charity fundraiser in the Last Year: Never true   Ran Out of Food in the Last Year: Never true  Housing: Low Risk    Last Housing Risk Score: 0  Physical Activity: Inactive   Days of Exercise per Week: 0 days   Minutes of Exercise per Session: 0 min  Social Connections: Socially Isolated   Frequency of Communication with Friends and Family: Never   Frequency of Social Gatherings with Friends and Family: Never   Attends Religious Services: Never   Marine scientist or Organizations: No   Attends Archivist Meetings: Never   Marital Status: Never married  Stress: No  Stress Concern Present   Feeling of Stress : Not at all  Tobacco Use: Low Risk    Smoking Tobacco Use: Never   Smokeless Tobacco Use: Never   Passive Exposure: Not on file  Transportation Needs: No Transportation Needs   Lack of Transportation (Medical): No   Lack of Transportation (Non-Medical): No    CCM Care Plan  No Known Allergies  Medications Reviewed Today     Reviewed by Germaine Pomfret, [facility] (Pharmacist) on 04/06/21 at [place] List Status: <None>   Medication Order Taking? Sig Documenting Provider Last Dose Status Informant  acetaminophen (TYLENOL) 325 MG tablet [id number] Yes TAKE 2 TABLETS (650 MG TOTAL) BY MOUTH EVERY 6 HOURS AS NEEDED FOR MILD PAIN OR HEADACHE. Steele Sizer, MD Taking Active Nursing Home Medication Administration Guide (MAG)  alfuzosin (UROXATRAL) 10 MG 24 hr tablet [id number] Yes TAKE 1 TABLET BY MOUTH DAILY Sowles, Drue Stager, MD Taking Active   ALPRAZolam Duanne Moron) 0.25 MG tablet [id number]  TAKE ONE TABLET BY MOUTH 2 TIMES A DAY AS NEEDED FOR ANXIETY (WHEN BLOOD PRESSURE IS VERY HIGH OR  ANXIOUS) Steele Sizer, MD  Active   ARTIFICIAL TEARS 0.2-0.2-1 % SOLN [id number]  PLACE 2 DROPS INTO EYE 2 TIMES A DAY AS NEEDED Steele Sizer, MD  Active   atorvastatin (LIPITOR) 40 MG tablet [id number] Yes TAKE 1 TABLET BY MOUTH EVERY Evert Kohl, MD Taking Active   augmented betamethasone dipropionate (DIPROLENE-AF) 0.05 % cream [id number]  APPLY TOPICALLY AS DIRECTED TO RASH AS NEEDED Steele Sizer, MD  Active Nursing Home Medication Administration Guide (MAG)  Calcium Carb-Cholecalciferol 500-10 MG-MCG TABS [id number] Yes TAKE 1 TABLET BY MOUTH 2 TIMES A Eloise Harman, MD Taking Active   Cranberry Juice Powder 425 MG CAPS [id number] Yes TAKE 1 CAPSULE BY MOUTH ONCE DAILY Steele Sizer, MD Taking Active   Elastic Bandages & Supports (MEDICAL COMPRESSION STOCKINGS) [place] [id number]  1 Units by Does not apply route daily. Steele Sizer, MD  Active Nursing Home Medication Administration Guide (MAG)  hydroxypropyl methylcellulose / hypromellose (ISOPTO TEARS / GONIOVISC) 2.5 % ophthalmic solution [id number]  Place 1 drop into both eyes 3 (three) times daily as needed for dry eyes. [provider]  Active   Incontinence Supply Disposable (DEPEND SILHOUETTE BRIEFS L/XL) MISC [id number]  1 each by Does not apply route 2 (two) times daily as needed. Steele Sizer, MD  Active Nursing Home Medication Administration Guide (MAG)  Iron-FA-B Cmp-C-Biot-Probiotic (FUSION PLUS) CAPS [id number] Yes TAKE 1 CAPSULE BY MOUTH DAILY Vanga, Tally Due, MD Taking Active   lubiprostone (AMITIZA) 24 MCG capsule [id number] Yes Take 1 capsule (24 mcg total) by mouth 2 (two) times daily with a meal. Steele Sizer, MD Taking Active   Nutritional Supplements (ENSURE ORIGINAL) LIQD [id number]  Take 1 Can by mouth 3 (three) times daily. Steele Sizer, MD  Active   sertraline (ZOLOFT) 100 MG tablet [id number] Yes TAKE 1 TABLET BY MOUTH DAILY Sowles, Drue Stager, MD Taking Active    sulfamethoxazole-trimethoprim (BACTRIM DS) 800-160 MG tablet [id number]  Take 1 tablet by mouth every 12 (twelve) hours. Zara Council A, PA-C  Active   vitamin C (ASCORBIC ACID) 500 MG tablet [id number] Yes Take 1 tablet (500 mg total) by mouth daily. Steele Sizer, MD Taking Active   Med List Note Rosario Jacks 08/01/17 1146): [facility]            Patient Active Problem List   Diagnosis Date Noted  Acute blood loss anemia 05/09/2020   Rectal bleeding 05/09/2020   Hematochezia    HCAP (healthcare-associated pneumonia) 05/07/2020   BPH (benign prostatic hyperplasia)    Hyperlipidemia    Depression with anxiety    Acute metabolic encephalopathy    Protein-calorie malnutrition, severe 04/22/2020   Pressure injury of skin 04/22/2020   Multifocal pneumonia    Sepsis due to undetermined organism ([place]) 04/19/2020   Personal history of urinary calculi 06/21/2019   PAD (peripheral artery disease) ([place]) 07/04/2017   Aortic valve calcification 07/04/2017   Coronary artery calcification 07/04/2017   Late effect acute polio 05/17/2016   Chronic constipation 11/19/2015   Mild major depression ([place]) 11/19/2015   Bacteriuria, chronic 09/11/2015   History of prostate cancer 03/02/2015   Amyotrophia 03/02/2015   Edema leg 03/02/2015   Cerebral palsy ([place]) 03/02/2015   Dyslipidemia 03/02/2015   Dermatitis, eczematoid 03/02/2015   Divergent squint 03/02/2015   H/O acute poliomyelitis 03/02/2015   Lymphedema 03/02/2015   Intellectual disability 03/02/2015   Hypertrophy of nail 03/02/2015   Chronic venous insufficiency 03/02/2015   Avitaminosis D 03/02/2015   Adynamia 03/02/2015   Dependent on wheelchair 03/02/2015   Renal cysts, acquired, bilateral 03/02/2015   At risk for falling 03/02/2015   Cerebral vascular disease 03/02/2015   Strabismus 02/11/2015   Urge incontinence of urine 02/11/2015   Wheelchair dependence 02/11/2015   Urinary  frequency 10/30/2014    Immunization History  Administered Date(s) Administered   Fluad Quad(high Dose 65+) 03/01/2020   Influenza, High Dose Seasonal PF 02/11/2015, 02/14/2016, 02/14/2017, 02/19/2018, 01/27/2019, 01/20/2020   Influenza,inj,Quad PF,6+ Mos 02/04/2013   Influenza-Unspecified 12/31/2013, 02/11/2015   Moderna Sars-Covid-2 Vaccination 06/24/2019, 07/26/2019   Pneumococcal Conjugate-13 06/22/2014, 06/22/2014   Pneumococcal Polysaccharide-23 09/15/2009, 09/15/2009   Tdap 09/15/2009, 09/15/2009, 01/20/2020    Conditions to be addressed/monitored:  Hyperlipidemia, Coronary Artery Disease, Depression, Anxiety, and BPH  Care Plan : General Pharmacy (Adult)  Updates made by Germaine Pomfret, RPH since 04/06/2021 12:00 AM     Problem: Hyperlipidemia, Coronary Artery Disease, Depression, Anxiety, and BPH   Priority: High     Long-Range Goal: Patient-Specific Goal   Start Date: 10/21/2020  Expected End Date: 04/06/2022  This Visit's Progress: On track  Recent Progress: On track  Priority: High  Note:   Current Barriers:  No barriers noted  Pharmacist Clinical Goal(s):  Patient will maintain control of cholesterol as evidenced by LDL less than 70  through collaboration with PharmD and provider.   Interventions: 1:1 collaboration with Steele Sizer, MD regarding development and update of comprehensive plan of care as evidenced by provider attestation and co-signature Inter-disciplinary care team collaboration (see longitudinal plan of care) Comprehensive medication review performed; medication list updated in electronic medical record  Hyperlipidemia: (LDL goal < 70) -Controlled -History of CAD, PAD  -Current treatment: Atorvastatin 40 mg daily  -Current treatment: Aspirin 81 mg daily  -Medications previously tried: NA  -Previously had instances of rectal bleeding  -Recommended to continue current medication  Depression/Anxiety (Goal: Maintain stable  mood) -Not ideally controlled -Current treatment: Alprazolam 0.25 mg twice daily  Sertraline 100 mg daily  -Medications previously tried/failed: NA -"Flares up" about once weekly  -Recommended to continue current medication  BPH (Goal: Maintain symptom control) -Controlled -Current treatment  Alfuzosin 10 mg daily  -Medications previously tried: NA  -Recommended to continue current medication  Patient Goals/Self-Care Activities Patient will:  - take medications as prescribed  Follow Up Plan: Telephone follow up appointment with care management  team member scheduled for:  09/28/2021 at 11:00 AM       Medication Assistance: None required.  Patient affirms current coverage meets needs.  Compliance/Adherence/Medication fill history: Care Gaps: Shingrix Covid booster  [facility]: Atorvastatin 40 mg last filled on 03/29/2021 for [facility] day supply at [facility]  Patient's preferred pharmacy is:  [facility], [place]. [address] [address] Phone: [phone] Fax: [phone]  Uses pill box? Yes Pt endorses 100% compliance  We discussed: Current pharmacy is preferred with insurance plan and patient is satisfied with pharmacy services Patient decided to: Continue current medication management strategy  Care Plan and Follow Up Patient Decision:  Patient agrees to Care Plan and Follow-up.  Plan: Telephone follow up appointment with care management team member scheduled for:  09/28/2021 at 11:00 AM  Malva Limes, [place] Pharmacist Practitioner  [facility] [phone]

## 2021-04-06 NOTE — Patient Instructions (Signed)
Visit Information It was great speaking with you today!  Please let me know if you have any questions about our visit.   Goals Addressed             This Visit's Progress    Track and Manage My Symptoms-Depression       Timeframe:  Long-Range Goal Priority:  High Start Date:  10/21/2020                            Expected End Date: 04/22/2022                       Follow Up within 90 days   - have a plan for how to handle bad days - spend time or talk with others at least 2 to 3 times per week - watch for early signs of feeling worse    Why is this important?   Keeping track of your progress will help your treatment team find the right mix of medicine and therapy for you.  Write in your journal every day.  Day-to-day changes in depression symptoms are normal. It may be more helpful to check your progress at the end of each week instead of every day.     Notes:         Patient Care Plan: General Pharmacy (Adult)     Problem Identified: Hyperlipidemia, Coronary Artery Disease, Depression, Anxiety, and BPH   Priority: High     Long-Range Goal: Patient-Specific Goal   Start Date: 10/21/2020  Expected End Date: 04/06/2022  This Visit's Progress: On track  Recent Progress: On track  Priority: High  Note:   Current Barriers:  No barriers noted  Pharmacist Clinical Goal(s):  Patient will maintain control of cholesterol as evidenced by LDL less than 70  through collaboration with PharmD and provider.   Interventions: 1:1 collaboration with Steele Sizer, MD regarding development and update of comprehensive plan of care as evidenced by provider attestation and co-signature Inter-disciplinary care team collaboration (see longitudinal plan of care) Comprehensive medication review performed; medication list updated in electronic medical record  Hyperlipidemia: (LDL goal < 70) -Controlled -History of CAD, PAD  -Current treatment: Atorvastatin 40 mg daily  -Current  treatment: Aspirin 81 mg daily  -Medications previously tried: NA  -Previously had instances of rectal bleeding  -Recommended to continue current medication  Depression/Anxiety (Goal: Maintain stable mood) -Not ideally controlled -Current treatment: Alprazolam 0.25 mg twice daily  Sertraline 100 mg daily  -Medications previously tried/failed: NA -"Flares up" about once weekly  -Recommended to continue current medication  BPH (Goal: Maintain symptom control) -Controlled -Current treatment  Alfuzosin 10 mg daily  -Medications previously tried: NA  -Recommended to continue current medication  Patient Goals/Self-Care Activities Patient will:  - take medications as prescribed  Follow Up Plan: Telephone follow up appointment with care management team member scheduled for:  09/28/2021 at 11:00 AM    Patient agreed to services and verbal consent obtained.   Patient verbalizes understanding of instructions provided today and agrees to view in [place].   Malva Limes, [place] Pharmacist Practitioner  [facility] [phone]

## 2021-04-20 ENCOUNTER — Other Ambulatory Visit: Payer: Self-pay

## 2021-04-20 ENCOUNTER — Ambulatory Visit (INDEPENDENT_AMBULATORY_CARE_PROVIDER_SITE_OTHER): Payer: Medicare Other | Admitting: Physician Assistant

## 2021-04-20 ENCOUNTER — Encounter: Payer: Self-pay | Admitting: Physician Assistant

## 2021-04-20 VITALS — BP 116/62 | HR 133

## 2021-04-20 DIAGNOSIS — R310 Gross hematuria: Secondary | ICD-10-CM | POA: Diagnosis not present

## 2021-04-20 LAB — URINALYSIS, COMPLETE
Bilirubin, UA: NEGATIVE
Glucose, UA: NEGATIVE
Ketones, UA: NEGATIVE
Nitrite, UA: NEGATIVE
Specific Gravity, UA: 1.02 (ref 1.005–1.030)
Urobilinogen, Ur: 1 mg/dL (ref 0.2–1.0)
pH, UA: 8.5 — ABNORMAL HIGH (ref 5.0–7.5)

## 2021-04-20 LAB — MICROSCOPIC EXAMINATION
RBC, Urine: >30 /hpf — AB (ref 0–2)
WBC, UA: >30 /hpf — AB (ref 0–5)

## 2021-04-20 MED ORDER — LEVOFLOXACIN 500 MG PO TABS: 500.0000 mg | ORAL_TABLET | Freq: Every day | ORAL | 0 refills | Status: DC

## 2021-04-20 NOTE — Progress Notes (Signed)
04/20/2021 11:07 AM   [name] 07/29/1940 [id number]  CC: Chief Complaint  Patient presents with   Hematuria   HPI: [name] is a 80 y.o. male with PMH BPH, intermediate risk prostate cancer s/p brachytherapy in 2010, chronic bacteriuria, nephrolithiasis, and renal cysts recently treated by Zara Council for left epididymitis who presents today for evaluation of gross hematuria.  He is accompanied today by his caregiver, who contributes to HPI.   Today they report a 1 day history of gross hematuria as well as worsening left testicular swelling x2 to 3 days.  Patient is reported some left flank pain, though this is not new.  Otherwise, he denies dysuria or scrotal pain.  In-office catheterized UA today positive for 3+ blood, 3+ protein, and 2+ leukocyte esterase; urine microscopy with >30 WBCs/HPF, >30 RBCs/HPF, triple phosphate and amorphous crystals, and many bacteria.  PMH: Past Medical History:  Diagnosis Date   Anxiety    Atrophy, disuse, muscle    BPH (benign prostatic hyperplasia)    Cerebral palsy (HCC)    Eczema    Elevated lipids    Elevated PSA    Exotropia    Hyperlipidemia    Kidney stone    Leukopenia    Lower extremity edema    lymphedema   Mental retardation    Over weight    Prostate cancer ([place])    Venous insufficiency    Vitamin D deficiency    Wheelchair dependence     Surgical History: Past Surgical History:  Procedure Laterality Date   COLONOSCOPY WITH PROPOFOL N/A 05/10/2020   Procedure: COLONOSCOPY WITH PROPOFOL;  Surgeon: Lin Landsman, MD;  Location: [facility] ENDOSCOPY;  Service: Gastroenterology;  Laterality: N/A;   COLONOSCOPY WITH PROPOFOL N/A 05/12/2020   Procedure: COLONOSCOPY WITH PROPOFOL;  Surgeon: Lin Landsman, MD;  Location: [facility] ENDOSCOPY;  Service: Gastroenterology;  Laterality: N/A;   CYSTOSCOPY/URETEROSCOPY/HOLMIUM LASER/STENT PLACEMENT Right 08/14/2017   Procedure: CYSTOSCOPY/URETEROSCOPY/HOLMIUM  LASER/STENT PLACEMENT;  Surgeon: Abbie Sons, MD;  Location: [facility] ORS;  Service: Urology;  Laterality: Right;   ESOPHAGOGASTRODUODENOSCOPY (EGD) WITH PROPOFOL N/A 05/12/2020   Procedure: ESOPHAGOGASTRODUODENOSCOPY (EGD) WITH PROPOFOL;  Surgeon: Lin Landsman, MD;  Location: [facility] ENDOSCOPY;  Service: Gastroenterology;  Laterality: N/A;   leg circulation surgery Right    PROSTATE SURGERY  11/10/2008   BRACHYTHERAPY    Home Medications:  Allergies as of 04/20/2021   No Known Allergies      Medication List        Accurate as of April 20, 2021 11:07 AM. If you have any questions, ask your nurse or doctor.          acetaminophen 325 MG tablet Commonly known as: TYLENOL TAKE 2 TABLETS (650 MG TOTAL) BY MOUTH EVERY 6 HOURS AS NEEDED FOR MILD PAIN OR HEADACHE.   alfuzosin 10 MG 24 hr tablet Commonly known as: UROXATRAL TAKE 1 TABLET BY MOUTH DAILY   ALPRAZolam 0.25 MG tablet Commonly known as: XANAX TAKE ONE TABLET BY MOUTH 2 TIMES A DAY AS NEEDED FOR ANXIETY (WHEN BLOOD PRESSURE IS VERY HIGH OR ANXIOUS)   Artificial Tears 0.2-0.2-1 % Soln Generic drug: Glycerin-Hypromellose-PEG 400 PLACE 2 DROPS INTO EYE 2 TIMES A DAY AS NEEDED   atorvastatin 40 MG tablet Commonly known as: LIPITOR TAKE 1 TABLET BY MOUTH EVERY EVENING   augmented betamethasone dipropionate 0.05 % cream Commonly known as: DIPROLENE-AF APPLY TOPICALLY AS DIRECTED TO RASH AS NEEDED   Calcium Carb-Cholecalciferol 500-10 MG-MCG Tabs TAKE  1 TABLET BY MOUTH 2 TIMES A DAY   Cranberry Juice Powder 425 MG Caps TAKE 1 CAPSULE BY MOUTH ONCE DAILY   Depend Silhouette Briefs L/XL Misc 1 each by Does not apply route 2 (two) times daily as needed.   Ensure Original Liqd Take 1 Can by mouth 3 (three) times daily.   Fusion Plus Caps TAKE 1 CAPSULE BY MOUTH DAILY   hydroxypropyl methylcellulose / hypromellose 2.5 % ophthalmic solution Commonly known as: ISOPTO TEARS / GONIOVISC Place 1 drop into  both eyes 3 (three) times daily as needed for dry eyes.   lubiprostone 24 MCG capsule Commonly known as: AMITIZA Take 1 capsule (24 mcg total) by mouth 2 (two) times daily with a meal.   Medical Compression Stockings Misc 1 Units by Does not apply route daily.   sertraline 100 MG tablet Commonly known as: ZOLOFT TAKE 1 TABLET BY MOUTH DAILY   sulfamethoxazole-trimethoprim 800-160 MG tablet Commonly known as: BACTRIM DS Take 1 tablet by mouth every 12 (twelve) hours.   vitamin C 500 MG tablet Commonly known as: ASCORBIC ACID Take 1 tablet (500 mg total) by mouth daily.        Allergies:  No Known Allergies  Family History: Family History  Problem Relation Age of Onset   Heart attack Brother    Heart disease Other     Social History:   reports that he has never smoked. He has never used smokeless tobacco. He reports that he does not drink alcohol and does not use drugs.  Physical Exam: BP 116/62 (BP Location: Right Arm, Patient Position: Sitting, Cuff Size: Normal)    Pulse (!) 133    SpO2 96%   Constitutional:  Alert and oriented, no acute distress, nontoxic appearing HEENT: [place], AT Cardiovascular: No clubbing, cyanosis, or edema Respiratory: Normal respiratory effort, no increased work of breathing GU: Bilateral descended testicles.  Uncircumcised phallus.  Urinary incontinence noted, urine is tea colored with a small clot in his diaper.  Left testicle and epididymis are enlarged but not particularly tender. Skin: No rashes, bruises or suspicious lesions Neurologic: In wheelchair Psychiatric: Normal mood and affect  Laboratory Data: Results for orders placed or performed in visit on 04/20/21  Microscopic Examination   Urine  Result Value Ref Range   WBC, UA >30 (A) 0 - 5 /hpf   RBC >30 (A) 0 - 2 /hpf   Epithelial Cells (non renal) 0-10 0 - 10 /hpf   Crystals Present (A) N/A   Crystal Type Amorphous Sediment N/A   Bacteria, UA Many (A) None seen/Few   Urinalysis, Complete  Result Value Ref Range   Specific Gravity, UA 1.020 1.005 - 1.030   pH, UA 8.5 (H) 5.0 - 7.5   Color, UA Yellow Yellow   Appearance Ur Turbid (A) Clear   Leukocytes,UA 2+ (A) Negative   Protein,UA 3+ (A) Negative/Trace   Glucose, UA Negative Negative   Ketones, UA Negative Negative   RBC, UA 3+ (A) Negative   Bilirubin, UA Negative Negative   Urobilinogen, Ur 1.0 0.2 - 1.0 mg/dL   Nitrite, UA Negative Negative   Microscopic Examination See below:    Assessment & Plan:   1. Gross hematuria Physical exam consistent with recurrent versus persistent left epididymoorchitis.  UA appears grossly infected, though he does have a history of chronic bacteriuria.  Recommend empiric treatment for acute cystitis versus left epididymoorchitis with 10 days of Levaquin.  We will send urine for culture for further evaluation.  No new reports of flank pain, so my suspicion for acute stone episode is low at this time.  It looks like Larene Beach was planning to start him on daily suppressive antibiotics, we will plan to start him on these once his culture results. Will plan to repeat cath UA in 2 weeks to prove resolution of hematuria. - Urinalysis, Complete - CULTURE, URINE COMPREHENSIVE - levofloxacin (LEVAQUIN) 500 MG tablet; Take 1 tablet (500 mg total) by mouth daily.  Dispense: 10 tablet; Refill: 0   Return in about 2 weeks (around 05/04/2021) for Symptom recheck with cath UA.  Debroah Loop, PA-C  [facility] [address], [place], [place] [address] [phone]

## 2021-04-22 ENCOUNTER — Other Ambulatory Visit: Payer: Self-pay | Admitting: Family Medicine

## 2021-04-22 ENCOUNTER — Other Ambulatory Visit: Payer: Self-pay | Admitting: Gastroenterology

## 2021-04-24 LAB — CULTURE, URINE COMPREHENSIVE

## 2021-04-26 ENCOUNTER — Ambulatory Visit: Payer: Medicare Other | Admitting: Urology

## 2021-04-26 ENCOUNTER — Telehealth: Payer: Self-pay

## 2021-04-26 DIAGNOSIS — N390 Urinary tract infection, site not specified: Secondary | ICD-10-CM

## 2021-04-26 MED ORDER — TRIMETHOPRIM 100 MG PO TABS: 100.0000 mg | ORAL_TABLET | Freq: Every day | ORAL | 0 refills | Status: DC

## 2021-04-26 NOTE — Telephone Encounter (Signed)
Spoke w pt's caregiver ([place]), [place] per DPR, advised her of message below, confirmed pt's pharmacy. Abx sent.

## 2021-04-26 NOTE — Telephone Encounter (Signed)
-----   Message from Debroah Loop, [place] sent at 04/26/2021  9:45 AM EST ----- Please start him on suppressive trimethoprim 100mg  daily x90 days. He should start this the day after he completed prescribed Levaquin. ----- Message ----- From: Interface, [facility] Lab Results In Sent: 04/20/2021   1:36 PM EST To: Debroah Loop, PA-C

## 2021-05-03 NOTE — Progress Notes (Incomplete Revision)
05/04/2021 12:25 PM   [name] [birth year] [id number]  Referring provider: Steele Sizer, MD [address] [place],  [place] [address]  Chief Complaint  Patient presents with   Nephrolithiasis   Urological history 1.  History T1c intermediate risk prostate cancer -Gleason 3+4 diagnosed 2010 -Treated with brachytherapy 11/10/2008 -Last PSA 10/2015 <0.1   2.  Chronic bacteriuria -Cystoscopy Dr. Elnoria Howard 2015 unremarkable, nonocclusive prostate or stricture   3.  Stone disease - CT 2019 nonobstructing right mid ureteral calculus -Ureteroscopic stone removal 08/14/2016   4.  Renal cysts -CTU 2019 bilateral renal cysts with 2 indeterminate right renal lesions (18 mm interpolar left kidney and 15 mm anterior right kidney) -MRI 07/2017 showed the left renal lesion was not present and was a perfusion anomaly; right renal lesion a Bosniak 2 cyst -CTU 2022 - There is 1.9 cm smooth marginated low-density lesion in the midportion of left kidney. There is 2.2 cm cyst in the anterior midportion of right kidney. There is 2.3 cm exophytic lesion in the anterior midportion of right kidney, possibly cyst   HPI: BENJIMIN HADDEN is a 81 y.o. male who presents today for cath UA and recheck on symptoms for a left epididymitis and gross hematuria.  He presented on April 20, 2021 after having an episode of gross hematuria and worsening left testicular swelling for the past few days.  Urine culture was positive for Proteus Mirabilis.  He was treated with culture appropriate antibiotics and is now on suppressive antibiotic biotics, trimethoprim 100 mg daily.   CATH UA 11-30 WBCs, greater than 30 RBCs and few bacteria.  He has not been complaining of left testicular pain.  Patient denies any modifying or aggravating factors.  Patient denies any gross hematuria, dysuria or suprapubic/flank pain.  Patient denies any fevers, chills, nausea or vomiting.    PMH: Past Medical History:   Diagnosis Date   Anxiety    Atrophy, disuse, muscle    BPH (benign prostatic hyperplasia)    Cerebral palsy (HCC)    Eczema    Elevated lipids    Elevated PSA    Exotropia    Hyperlipidemia    Kidney stone    Leukopenia    Lower extremity edema    lymphedema   Mental retardation    Over weight    Prostate cancer ([place])    Venous insufficiency    Vitamin D deficiency    Wheelchair dependence     Surgical History: Past Surgical History:  Procedure Laterality Date   COLONOSCOPY WITH PROPOFOL N/A 05/10/2020   Procedure: COLONOSCOPY WITH PROPOFOL;  Surgeon: Lin Landsman, MD;  Location: [facility] ENDOSCOPY;  Service: Gastroenterology;  Laterality: N/A;   COLONOSCOPY WITH PROPOFOL N/A 05/12/2020   Procedure: COLONOSCOPY WITH PROPOFOL;  Surgeon: Lin Landsman, MD;  Location: [facility] ENDOSCOPY;  Service: Gastroenterology;  Laterality: N/A;   CYSTOSCOPY/URETEROSCOPY/HOLMIUM LASER/STENT PLACEMENT Right 08/14/2017   Procedure: CYSTOSCOPY/URETEROSCOPY/HOLMIUM LASER/STENT PLACEMENT;  Surgeon: Abbie Sons, MD;  Location: [facility] ORS;  Service: Urology;  Laterality: Right;   ESOPHAGOGASTRODUODENOSCOPY (EGD) WITH PROPOFOL N/A 05/12/2020   Procedure: ESOPHAGOGASTRODUODENOSCOPY (EGD) WITH PROPOFOL;  Surgeon: Lin Landsman, MD;  Location: [facility] ENDOSCOPY;  Service: Gastroenterology;  Laterality: N/A;   leg circulation surgery Right    PROSTATE SURGERY  11/10/2008   BRACHYTHERAPY    Home Medications:  Allergies as of 05/04/2021   No Known Allergies      Medication List        Accurate as  of May 04, 2021 12:25 PM. If you have any questions, ask your nurse or doctor.          acetaminophen 325 MG tablet Commonly known as: TYLENOL TAKE 2 TABLETS (650 MG TOTAL) BY MOUTH EVERY 6 HOURS AS NEEDED FOR MILD PAIN OR HEADACHE.   alfuzosin 10 MG 24 hr tablet Commonly known as: UROXATRAL TAKE 1 TABLET BY MOUTH DAILY   ALPRAZolam 0.25 MG tablet Commonly known as: XANAX TAKE  ONE TABLET BY MOUTH 2 TIMES A DAY AS NEEDED FOR ANXIETY (WHEN BLOOD PRESSURE IS VERY HIGH OR ANXIOUS)   Artificial Tears 0.2-0.2-1 % Soln Generic drug: Glycerin-Hypromellose-PEG 400 PLACE 2 DROPS INTO EYE 2 TIMES A DAY AS NEEDED   atorvastatin 40 MG tablet Commonly known as: LIPITOR TAKE 1 TABLET BY MOUTH EVERY EVENING   augmented betamethasone dipropionate 0.05 % cream Commonly known as: DIPROLENE-AF APPLY TOPICALLY AS DIRECTED TO RASH AS NEEDED   Calcium Carb-Cholecalciferol 500-10 MG-MCG Tabs TAKE 1 TABLET BY MOUTH 2 TIMES A DAY   Cranberry Juice Powder 425 MG Caps TAKE 1 CAPSULE BY MOUTH ONCE DAILY   Depend Silhouette Briefs L/XL Misc 1 each by Does not apply route 2 (two) times daily as needed.   Ensure Original Liqd Take 1 Can by mouth 3 (three) times daily.   Fusion Plus Caps TAKE 1 CAPSULE BY MOUTH DAILY   GNP Vitamin C 500 MG tablet Generic drug: ascorbic acid TAKE ONE TABLET BY MOUTH ONCE DAILY   hydroxypropyl methylcellulose / hypromellose 2.5 % ophthalmic solution Commonly known as: ISOPTO TEARS / GONIOVISC Place 1 drop into both eyes 3 (three) times daily as needed for dry eyes.   levofloxacin 500 MG tablet Commonly known as: LEVAQUIN Take 1 tablet (500 mg total) by mouth daily.   lubiprostone 24 MCG capsule Commonly known as: AMITIZA Take 1 capsule (24 mcg total) by mouth 2 (two) times daily with a meal.   Medical Compression Stockings Misc 1 Units by Does not apply route daily.   nystatin cream Commonly known as: MYCOSTATIN Apply 1 application topically 2 (two) times daily. Started by: Zara Council, PA-C   sertraline 100 MG tablet Commonly known as: ZOLOFT TAKE 1 TABLET BY MOUTH DAILY   sulfamethoxazole-trimethoprim 800-160 MG tablet Commonly known as: BACTRIM DS Take 1 tablet by mouth every 12 (twelve) hours.   trimethoprim 100 MG tablet Commonly known as: TRIMPEX Take 1 tablet (100 mg total) by mouth daily.        Allergies: No  Known Allergies  Family History: Family History  Problem Relation Age of Onset   Heart attack Brother    Heart disease Other     Social History:  reports that he has never smoked. He has never used smokeless tobacco. He reports that he does not drink alcohol and does not use drugs.  ROS: Pertinent ROS in HPI  Physical Exam: BP (!) 110/58    Wt 138 lb (62.6 kg)    BMI 19.80 kg/m   Constitutional:  Well nourished. Alert and oriented, No acute distress. HEENT: [place] AT, mask in place.  Trachea midline Cardiovascular: No clubbing, cyanosis, or edema. Respiratory: Normal respiratory effort, no increased work of breathing. GU: No CVA tenderness.  No bladder fullness or masses.  Patient with uncircumcised phallus. Foreskin easily retracted.  Balanitis is present.  Urethral meatus is patent.  No penile discharge. No penile lesions or rashes. Scrotum without lesions, cysts, rashes and/or edema.  Right testicle is located scrotally. No masse  is appreciated in the testicle. Left and right epididymis are normal.  Left testicle is indurated, but it patient is not complaining of pain.  Neurologic: Grossly intact, no focal deficits, moving all 4 extremities. Psychiatric: Normal mood and affect.   Laboratory Data: Urinalysis *** I have reviewed the labs.   Pertinent Imaging: N/A   Assessment & Plan:    1.  Left epididymitis -Testicle is still indurated and staff states he does not complain of pain although he is nonverbal -We will obtain a scrotal ultrasound at this time for further evaluation of the scrotal contents -He is currently on trimethoprim 100 mg daily  2. Bilateral Nephrolithiasis -CTU demonstrates bilateral nephrolithiasis -Stone analysis reveals struvite stone -No hydronephrosis noted on CT, patient does not complain of flank pain and serum creatinine is normal, surgical intervention is not warranted as it would likely be poorly tolerated and patient  3.  Balanitis -Patient  prescribed nystatin cream to be applied twice daily -I advised staff to pull the foreskin back wash with soapy water and dry with a towel and then apply the cream  Return in about 3 weeks (around 05/25/2021) for follow up for scrotal ultrasound, rechecked on testicle and recheck on balanitis.  These notes generated with voice recognition software. I apologize for typographical errors.  Zara Council, PA-C  [facility] [address]  [place], [place] [address] [phone]

## 2021-05-03 NOTE — Progress Notes (Addendum)
05/04/2021 12:25 PM   [name] 1940-07-20 [id number]  Referring provider: Steele Sizer, MD [address] [place],  [place] [address]  Chief Complaint  Patient presents with   Nephrolithiasis   Urological history 1.  History T1c intermediate risk prostate cancer -Gleason 3+4 diagnosed 2010 -Treated with brachytherapy 11/10/2008 -Last PSA 10/2015 <0.1   2.  Chronic bacteriuria -Cystoscopy Dr. Elnoria [facility] 2015 unremarkable, nonocclusive prostate or stricture   3.  Stone disease - CT 2019 nonobstructing right mid ureteral calculus -Ureteroscopic stone removal 08/14/2016   4.  Renal cysts -CTU 2019 bilateral renal cysts with 2 indeterminate right renal lesions (18 mm interpolar left kidney and 15 mm anterior right kidney) -MRI 07/2017 showed the left renal lesion was not present and was a perfusion anomaly; right renal lesion a Bosniak 2 cyst -CTU 2022 - There is 1.9 cm smooth marginated low-density lesion in the midportion of left kidney. There is 2.2 cm cyst in the anterior midportion of right kidney. There is 2.3 cm exophytic lesion in the anterior midportion of right kidney, possibly cyst   HPI: [name] is a 81 y.o. male who presents today for cath UA and recheck on symptoms for a left epididymitis and gross hematuria.  He presented on April 20, 2021 after having an episode of gross hematuria and worsening left testicular swelling for the past few days.  Urine culture was positive for Proteus Mirabilis.  He was treated with culture appropriate antibiotics and is now on suppressive antibiotic biotics, trimethoprim 100 mg daily.   CATH UA 11-30 WBCs, greater than 30 RBCs and few bacteria.  He has not been complaining of left testicular pain.  Patient denies any modifying or aggravating factors.  Patient denies any gross hematuria, dysuria or suprapubic/flank pain.  Patient denies any fevers, chills, nausea or vomiting.    PMH: Past Medical History:   Diagnosis Date   Anxiety    Atrophy, disuse, muscle    BPH (benign prostatic hyperplasia)    Cerebral palsy (HCC)    Eczema    Elevated lipids    Elevated PSA    Exotropia    Hyperlipidemia    Kidney stone    Leukopenia    Lower extremity edema    lymphedema   Mental retardation    Over weight    Prostate cancer ([place])    Venous insufficiency    Vitamin D deficiency    Wheelchair dependence     Surgical History: Past Surgical History:  Procedure Laterality Date   COLONOSCOPY WITH PROPOFOL N/A 05/10/2020   Procedure: COLONOSCOPY WITH PROPOFOL;  Surgeon: Lin Landsman, MD;  Location: [facility] ENDOSCOPY;  Service: Gastroenterology;  Laterality: N/A;   COLONOSCOPY WITH PROPOFOL N/A 05/12/2020   Procedure: COLONOSCOPY WITH PROPOFOL;  Surgeon: Lin Landsman, MD;  Location: [facility] ENDOSCOPY;  Service: Gastroenterology;  Laterality: N/A;   CYSTOSCOPY/URETEROSCOPY/HOLMIUM LASER/STENT PLACEMENT Right 08/14/2017   Procedure: CYSTOSCOPY/URETEROSCOPY/HOLMIUM LASER/STENT PLACEMENT;  Surgeon: Abbie Sons, MD;  Location: [facility] ORS;  Service: Urology;  Laterality: Right;   ESOPHAGOGASTRODUODENOSCOPY (EGD) WITH PROPOFOL N/A 05/12/2020   Procedure: ESOPHAGOGASTRODUODENOSCOPY (EGD) WITH PROPOFOL;  Surgeon: Lin Landsman, MD;  Location: [facility] ENDOSCOPY;  Service: Gastroenterology;  Laterality: N/A;   leg circulation surgery Right    PROSTATE SURGERY  11/10/2008   BRACHYTHERAPY    Home Medications:  Allergies as of 05/04/2021   No Known Allergies      Medication List        Accurate as  of May 04, 2021 12:25 PM. If you have any questions, ask your nurse or doctor.          acetaminophen 325 MG tablet Commonly known as: TYLENOL TAKE 2 TABLETS (650 MG TOTAL) BY MOUTH EVERY 6 HOURS AS NEEDED FOR MILD PAIN OR HEADACHE.   alfuzosin 10 MG 24 hr tablet Commonly known as: UROXATRAL TAKE 1 TABLET BY MOUTH DAILY   ALPRAZolam 0.25 MG tablet Commonly known as: XANAX TAKE  ONE TABLET BY MOUTH 2 TIMES A DAY AS NEEDED FOR ANXIETY (WHEN BLOOD PRESSURE IS VERY HIGH OR ANXIOUS)   Artificial Tears 0.2-0.2-1 % Soln Generic drug: Glycerin-Hypromellose-PEG 400 PLACE 2 DROPS INTO EYE 2 TIMES A DAY AS NEEDED   atorvastatin 40 MG tablet Commonly known as: LIPITOR TAKE 1 TABLET BY MOUTH EVERY EVENING   augmented betamethasone dipropionate 0.05 % cream Commonly known as: DIPROLENE-AF APPLY TOPICALLY AS DIRECTED TO RASH AS NEEDED   Calcium Carb-Cholecalciferol 500-10 MG-MCG Tabs TAKE 1 TABLET BY MOUTH 2 TIMES A DAY   Cranberry Juice Powder 425 MG Caps TAKE 1 CAPSULE BY MOUTH ONCE DAILY   Depend Silhouette Briefs L/XL Misc 1 each by Does not apply route 2 (two) times daily as needed.   Ensure Original Liqd Take 1 Can by mouth 3 (three) times daily.   Fusion Plus Caps TAKE 1 CAPSULE BY MOUTH DAILY   GNP Vitamin C 500 MG tablet Generic drug: ascorbic acid TAKE ONE TABLET BY MOUTH ONCE DAILY   hydroxypropyl methylcellulose / hypromellose 2.5 % ophthalmic solution Commonly known as: ISOPTO TEARS / GONIOVISC Place 1 drop into both eyes 3 (three) times daily as needed for dry eyes.   levofloxacin 500 MG tablet Commonly known as: LEVAQUIN Take 1 tablet (500 mg total) by mouth daily.   lubiprostone 24 MCG capsule Commonly known as: AMITIZA Take 1 capsule (24 mcg total) by mouth 2 (two) times daily with a meal.   Medical Compression Stockings Misc 1 Units by Does not apply route daily.   nystatin cream Commonly known as: MYCOSTATIN Apply 1 application topically 2 (two) times daily. Started by: Zara Council, PA-C   sertraline 100 MG tablet Commonly known as: ZOLOFT TAKE 1 TABLET BY MOUTH DAILY   sulfamethoxazole-trimethoprim 800-160 MG tablet Commonly known as: BACTRIM DS Take 1 tablet by mouth every 12 (twelve) hours.   trimethoprim 100 MG tablet Commonly known as: TRIMPEX Take 1 tablet (100 mg total) by mouth daily.        Allergies: No  Known Allergies  Family History: Family History  Problem Relation Age of Onset   Heart attack Brother    Heart disease Other     Social History:  reports that he has never smoked. He has never used smokeless tobacco. He reports that he does not drink alcohol and does not use drugs.  ROS: Pertinent ROS in HPI  Physical Exam: BP (!) 110/58    Wt 138 lb (62.6 kg)    BMI 19.80 kg/m   Constitutional:  Well nourished. Alert and oriented, No acute distress. HEENT: [place] AT, mask in place.  Trachea midline Cardiovascular: No clubbing, cyanosis, or edema. Respiratory: Normal respiratory effort, no increased work of breathing. GU: No CVA tenderness.  No bladder fullness or masses.  Patient with uncircumcised phallus. Foreskin easily retracted.  Balanitis is present.  Urethral meatus is patent.  No penile discharge. No penile lesions or rashes. Scrotum without lesions, cysts, rashes and/or edema.  Right testicle is located scrotally. No masse  is appreciated in the testicle. Left and right epididymis are normal.  Left testicle is indurated, but it patient is not complaining of pain.  Neurologic: Grossly intact, no focal deficits, moving all 4 extremities. Psychiatric: Normal mood and affect.   Laboratory Data: Urinalysis Component     Latest Ref Rng & Units 05/04/2021          Specific Gravity, UA     1.005 - 1.030 1.010  pH, UA     5.0 - 7.5 5.5  Color, UA     Yellow Yellow  Appearance Ur     Clear Clear  Leukocytes,UA     Negative 3+ (A)  Protein,UA     Negative/Trace 1+ (A)  Glucose, UA     Negative Negative  Ketones, UA     Negative Negative  RBC, UA     Negative 2+ (A)  Bilirubin, UA     Negative Negative  Urobilinogen, Ur     0.2 - 1.0 mg/dL 1.0  Nitrite, UA     Negative Negative  Microscopic Examination      See below:   Component     Latest Ref Rng & Units 05/04/2021  WBC, UA     0 - 5 /hpf 11-30 (A)  RBC     0 - 2 /hpf >30 (A)  Epithelial Cells (non renal)      0 - 10 /hpf 0-10  Bacteria, UA     None seen/Few Few  I have reviewed the labs.   Pertinent Imaging: N/A   Assessment & Plan:    1.  Left epididymitis -Testicle is still indurated and staff states he does not complain of pain although he is nonverbal -We will obtain a scrotal ultrasound at this time for further evaluation of the scrotal contents -He is currently on trimethoprim 100 mg daily  2. Bilateral Nephrolithiasis -CTU demonstrates bilateral nephrolithiasis -Stone analysis reveals struvite stone -No hydronephrosis noted on CT, patient does not complain of flank pain and serum creatinine is normal, surgical intervention is not warranted as it would likely be poorly tolerated and patient  3.  Balanitis -Patient prescribed nystatin cream to be applied twice daily -I advised staff to pull the foreskin back wash with soapy water and dry with a towel and then apply the cream  Return in about 3 weeks (around 05/25/2021) for follow up for scrotal ultrasound, rechecked on testicle and recheck on balanitis.  These notes generated with voice recognition software. I apologize for typographical errors.  Zara Council, PA-C  [facility] [address]  [place],  [address] [phone]

## 2021-05-04 ENCOUNTER — Encounter: Payer: Self-pay | Admitting: Urology

## 2021-05-04 ENCOUNTER — Other Ambulatory Visit: Payer: Self-pay

## 2021-05-04 ENCOUNTER — Ambulatory Visit (INDEPENDENT_AMBULATORY_CARE_PROVIDER_SITE_OTHER): Payer: Medicare Other | Admitting: Urology

## 2021-05-04 VITALS — BP 110/58 | Wt 138.0 lb

## 2021-05-04 DIAGNOSIS — N200 Calculus of kidney: Secondary | ICD-10-CM

## 2021-05-04 DIAGNOSIS — N481 Balanitis: Secondary | ICD-10-CM | POA: Diagnosis not present

## 2021-05-04 DIAGNOSIS — R310 Gross hematuria: Secondary | ICD-10-CM | POA: Diagnosis not present

## 2021-05-04 DIAGNOSIS — N451 Epididymitis: Secondary | ICD-10-CM

## 2021-05-04 MED ORDER — NYSTATIN 100000 UNIT/GM EX CREA: 1.0000 "application " | TOPICAL_CREAM | Freq: Two times a day (BID) | CUTANEOUS | 0 refills | Status: DC

## 2021-05-04 NOTE — Addendum Note (Signed)
Addended by: Zara Council A on: 05/04/2021 04:47 PM   Modules accepted: Orders

## 2021-05-05 ENCOUNTER — Ambulatory Visit: Payer: Medicare Other | Admitting: Urology

## 2021-05-05 LAB — URINALYSIS, COMPLETE
Bilirubin, UA: NEGATIVE
Glucose, UA: NEGATIVE
Ketones, UA: NEGATIVE
Nitrite, UA: NEGATIVE
Specific Gravity, UA: 1.01 (ref 1.005–1.030)
Urobilinogen, Ur: 1 mg/dL (ref 0.2–1.0)
pH, UA: 5.5 (ref 5.0–7.5)

## 2021-05-05 LAB — MICROSCOPIC EXAMINATION: RBC, Urine: >30 /hpf — AB (ref 0–2)

## 2021-05-08 LAB — CULTURE, URINE COMPREHENSIVE

## 2021-05-12 ENCOUNTER — Telehealth: Payer: Self-pay | Admitting: Urology

## 2021-05-12 NOTE — Telephone Encounter (Signed)
Patient's caregiver, [name], contact the office stating that the left testicle was now very swollen.  We advised her to seek immediate care in the emergency room for STAT imaging as he has been on 2 courses of antibiotics and is currently on suppressive antibiotics and it is concerning that the testicular infection has only worsened.

## 2021-05-12 NOTE — Telephone Encounter (Signed)
Spoke with [name] who reports pt's vitals are all stable, pt is not running a fever or experiencing any symptoms. She states scrotal US is scheduled for 2/24. Advised [name] of Justice note and to seek care in the ED. [name] expressed understanding.

## 2021-05-17 ENCOUNTER — Other Ambulatory Visit: Payer: Self-pay

## 2021-05-17 ENCOUNTER — Ambulatory Visit
Admission: RE | Admit: 2021-05-17 | Discharge: 2021-05-17 | Disposition: A | Discharge status: Home / Self Care | Payer: Medicare Other | Source: Ambulatory Visit | Attending: Urology | Admitting: Urology

## 2021-05-17 DIAGNOSIS — N451 Epididymitis: Secondary | ICD-10-CM | POA: Diagnosis present

## 2021-05-19 ENCOUNTER — Other Ambulatory Visit: Payer: Self-pay

## 2021-05-19 ENCOUNTER — Inpatient Hospital Stay (HOSPITAL_BASED_OUTPATIENT_CLINIC_OR_DEPARTMENT_OTHER): Payer: Medicare Other | Admitting: Nurse Practitioner

## 2021-05-19 ENCOUNTER — Telehealth: Payer: Self-pay

## 2021-05-19 ENCOUNTER — Inpatient Hospital Stay: Payer: Medicare Other

## 2021-05-19 ENCOUNTER — Inpatient Hospital Stay: Payer: Medicare Other | Attending: Nurse Practitioner

## 2021-05-19 ENCOUNTER — Encounter: Payer: Self-pay | Admitting: Nurse Practitioner

## 2021-05-19 VITALS — BP 104/61 | HR 70 | Resp 18

## 2021-05-19 VITALS — BP 79/54 | HR 65 | Temp 98.7°F | Resp 20 | Wt 130.0 lb

## 2021-05-19 DIAGNOSIS — D72819 Decreased white blood cell count, unspecified: Secondary | ICD-10-CM | POA: Diagnosis not present

## 2021-05-19 DIAGNOSIS — D709 Neutropenia, unspecified: Secondary | ICD-10-CM

## 2021-05-19 DIAGNOSIS — D649 Anemia, unspecified: Secondary | ICD-10-CM

## 2021-05-19 DIAGNOSIS — D696 Thrombocytopenia, unspecified: Secondary | ICD-10-CM | POA: Insufficient documentation

## 2021-05-19 DIAGNOSIS — Z79899 Other long term (current) drug therapy: Secondary | ICD-10-CM | POA: Insufficient documentation

## 2021-05-19 DIAGNOSIS — I959 Hypotension, unspecified: Secondary | ICD-10-CM

## 2021-05-19 DIAGNOSIS — E860 Dehydration: Secondary | ICD-10-CM

## 2021-05-19 DIAGNOSIS — D61818 Other pancytopenia: Secondary | ICD-10-CM | POA: Insufficient documentation

## 2021-05-19 LAB — CBC WITH DIFFERENTIAL/PLATELET
Abs Immature Granulocytes: 0.04 10*3/uL (ref 0.00–0.07)
Basophils Absolute: 0 10*3/uL (ref 0.0–0.1)
Basophils Relative: 0 %
Eosinophils Absolute: 0.1 10*3/uL (ref 0.0–0.5)
Eosinophils Relative: 1 %
HCT: 32.1 % — ABNORMAL LOW (ref 39.0–52.0)
Hemoglobin: 10.9 g/dL — ABNORMAL LOW (ref 13.0–17.0)
Immature Granulocytes: 1 %
Lymphocytes Relative: 13 %
Lymphs Abs: 0.9 10*3/uL (ref 0.7–4.0)
MCH: 32.8 pg (ref 26.0–34.0)
MCHC: 34 g/dL (ref 30.0–36.0)
MCV: 96.7 fL (ref 80.0–100.0)
Monocytes Absolute: 0.5 10*3/uL (ref 0.1–1.0)
Monocytes Relative: 7 %
Neutro Abs: 5.9 10*3/uL (ref 1.7–7.7)
Neutrophils Relative %: 78 %
Platelets: 198 10*3/uL (ref 150–400)
RBC: 3.32 MIL/uL — ABNORMAL LOW (ref 4.22–5.81)
RDW: 13.5 % (ref 11.5–15.5)
WBC: 7.4 10*3/uL (ref 4.0–10.5)
nRBC: 0 % (ref 0.0–0.2)

## 2021-05-19 LAB — VITAMIN B12: Vitamin B-12: 998 pg/mL — ABNORMAL HIGH (ref 180–914)

## 2021-05-19 LAB — IRON AND TIBC
Iron: 35 ug/dL — ABNORMAL LOW (ref 45–182)
Saturation Ratios: 15 % — ABNORMAL LOW (ref 17.9–39.5)
TIBC: 237 ug/dL — ABNORMAL LOW (ref 250–450)
UIBC: 202 ug/dL

## 2021-05-19 LAB — FERRITIN: Ferritin: 135 ng/mL (ref 24–336)

## 2021-05-19 MED ORDER — SODIUM CHLORIDE 0.9 % IV SOLN
INTRAVENOUS | Status: DC
  Filled 2021-05-19 (×2): qty 250

## 2021-05-19 NOTE — Patient Instructions (Signed)
[facility] AT [place]  Discharge Instructions: Thank you for choosing [facility] to provide your oncology and hematology care.  If you have a lab appointment with the [facility], please go directly to the [facility] and check in at the registration area.  Wear comfortable clothing and clothing appropriate for easy access to any Portacath or PICC line.   We strive to give you quality time with your provider. You may need to reschedule your appointment if you arrive late (15 or more minutes).  Arriving late affects you and other patients whose appointments are after yours.  Also, if you miss three or more appointments without notifying the office, you may be dismissed from the clinic at the providers discretion.      For prescription refill requests, have your pharmacy contact our office and allow 72 hours for refills to be completed.    Today you received the following chemotherapy and/or immunotherapy agents DEHYDRATION      To help prevent nausea and vomiting after your treatment, we encourage you to take your nausea medication as directed.  BELOW ARE SYMPTOMS THAT SHOULD BE REPORTED IMMEDIATELY: *FEVER GREATER THAN 100.4 F (38 C) OR HIGHER *CHILLS OR SWEATING *NAUSEA AND VOMITING THAT IS NOT CONTROLLED WITH YOUR NAUSEA MEDICATION *UNUSUAL SHORTNESS OF BREATH *UNUSUAL BRUISING OR BLEEDING *URINARY PROBLEMS (pain or burning when urinating, or frequent urination) *BOWEL PROBLEMS (unusual diarrhea, constipation, pain near the anus) TENDERNESS IN MOUTH AND THROAT WITH OR WITHOUT PRESENCE OF ULCERS (sore throat, sores in mouth, or a toothache) UNUSUAL RASH, SWELLING OR PAIN  UNUSUAL VAGINAL DISCHARGE OR ITCHING   Items with * indicate a potential emergency and should be followed up as soon as possible or go to the Emergency Department if any problems should occur.  Please show the CHEMOTHERAPY ALERT CARD or IMMUNOTHERAPY ALERT CARD at check-in to  the Emergency Department and triage nurse.  Should you have questions after your visit or need to cancel or reschedule your appointment, please contact [facility] [place] AT [place]  [phone] and follow the prompts.  Office hours are 8:00 a.m. to 4:30 p.m. Monday - Friday. Please note that voicemails left after 4:00 p.m. may not be returned until the following business day.  We are closed weekends and major holidays. You have access to a nurse at all times for urgent questions. Please call the main number to the clinic [phone] and follow the prompts.  For any non-urgent questions, you may also contact your provider using MyChart. We now offer e-Visits for anyone 86 and older to request care online for non-urgent symptoms. For details visit [email or web address].   Also download the MyChart app! Go to the app store, search "MyChart", open the app, select [facility], and log in with your MyChart username and password.  Due to Covid, a mask is required upon entering the hospital/clinic. If you do not have a mask, one will be given to you upon arrival. For doctor visits, patients may have 1 support person aged 18 or older with them. For treatment visits, patients cannot have anyone with them due to current Covid guidelines and our immunocompromised population.   Dehydration, Adult Dehydration is condition in which there is not enough water or other fluids in the body. This happens when a person loses more fluids than he or she takes in. Important body parts cannot work right without the right amount of fluids. Any loss of fluids from the body can cause dehydration. Dehydration  can be mild, worse, or very bad. It should be treated right away to keep it from getting very bad. What are the causes? This condition may be caused by: Conditions that cause loss of water or other fluids, such as: Watery poop (diarrhea). Vomiting. Sweating a lot. Peeing (urinating) a lot. Not  drinking enough fluids, especially when you: Are ill. Are doing things that take a lot of energy to do. Other illnesses and conditions, such as fever or infection. Certain medicines, such as medicines that take extra fluid out of the body (diuretics). Lack of safe drinking water. Not being able to get enough water and food. What increases the risk? The following factors may make you more likely to develop this condition: Having a long-term (chronic) illness that has not been treated the right way, such as: Diabetes. Heart disease. Kidney disease. Being 39 years of age or older. Having a disability. Living in a place that is high above the ground or sea (high in altitude). The thinner, dried air causes more fluid loss. Doing exercises that put stress on your body for a long time. What are the signs or symptoms? Symptoms of dehydration depend on how bad it is. Mild or worse dehydration Thirst. Dry lips or dry mouth. Feeling dizzy or light-headed, especially when you stand up from sitting. Muscle cramps. Your body making: Dark pee (urine). Pee may be the color of tea. Less pee than normal. Less tears than normal. Headache. Very bad dehydration Changes in skin. Skin may: Be cold to the touch (clammy). Be blotchy or pale. Not go back to normal right after you lightly pinch it and let it go. Little or no tears, pee, or sweat. Changes in vital signs, such as: Fast breathing. Low blood pressure. Weak pulse. Pulse that is more than 100 beats a minute when you are sitting still. Other changes, such as: Feeling very thirsty. Eyes that look hollow (sunken). Cold hands and feet. Being mixed up (confused). Being very tired (lethargic) or having trouble waking from sleep. Short-term weight loss. Loss of consciousness. How is this treated? Treatment for this condition depends on how bad it is. Treatment should start right away. Do not wait until your condition gets very bad. Very bad  dehydration is an emergency. You will need to go to a hospital. Mild or worse dehydration can be treated at home. You may be asked to: Drink more fluids. Drink an oral rehydration solution (ORS). This drink helps get the right amounts of fluids and salts and minerals in the blood (electrolytes). Very bad dehydration can be treated: With fluids through an IV tube. By getting normal levels of salts and minerals in your blood. This is often done by giving salts and minerals through a tube. The tube is passed through your nose and into your stomach. By treating the root cause. Follow these instructions at home: Oral rehydration solution If told by your doctor, drink an ORS: Make an ORS. Use instructions on the package. Start by drinking small amounts, about  cup (120 mL) every 5-10 minutes. Slowly drink more until you have had the amount that your doctor said to have. Eating and drinking     Drink enough clear fluid to keep your pee pale yellow. If you were told to drink an ORS, finish the ORS first. Then, start slowly drinking other clear fluids. Drink fluids such as: Water. Do not drink only water. Doing that can make the salt (sodium) level in your body get too low.  Water from ice chips you suck on. Fruit juice that you have added water to (diluted). Low-calorie sports drinks. Eat foods that have the right amounts of salts and minerals, such as: Bananas. Oranges. Potatoes. Tomatoes. Spinach. Do not drink alcohol. Avoid: Drinks that have a lot of sugar. These include: High-calorie sports drinks. Fruit juice that you did not add water to. Soda. Caffeine. Foods that are greasy or have a lot of fat or sugar. General instructions Take over-the-counter and prescription medicines only as told by your doctor. Do not take salt tablets. Doing that can make the salt level in your body get too high. Return to your normal activities as told by your doctor. Ask your doctor what activities  are safe for you. Keep all follow-up visits as told by your doctor. This is important. Contact a doctor if: You have pain in your belly (abdomen) and the pain: Gets worse. Stays in one place. You have a rash. You have a stiff neck. You get angry or annoyed (irritable) more easily than normal. You are more tired or have a harder time waking than normal. You feel: Weak or dizzy. Very thirsty. Get help right away if you have: Any symptoms of very bad dehydration. Symptoms of vomiting, such as: You cannot eat or drink without vomiting. Your vomiting gets worse or does not go away. Your vomit has blood or green stuff in it. Symptoms that get worse with treatment. A fever. A very bad headache. Problems with peeing or pooping (having a bowel movement), such as: Watery poop that gets worse or does not go away. Blood in your poop (stool). This may cause poop to look black and tarry. Not peeing in 6-8 hours. Peeing only a small amount of very dark pee in 6-8 hours. Trouble breathing. These symptoms may be an emergency. Do not wait to see if the symptoms will go away. Get medical help right away. Call your local emergency services (911 in the [place] Do not drive yourself to the hospital. Summary Dehydration is a condition in which there is not enough water or other fluids in the body. This happens when a person loses more fluids than he or she takes in. Treatment for this condition depends on how bad it is. Treatment should be started right away. Do not wait until your condition gets very bad. Drink enough clear fluid to keep your pee pale yellow. If you were told to drink an oral rehydration solution (ORS), finish the ORS first. Then, start slowly drinking other clear fluids. Take over-the-counter and prescription medicines only as told by your doctor. Get help right away if you have any symptoms of very bad dehydration. This information is not intended to replace advice given to you by your  health care provider. Make sure you discuss any questions you have with your health care provider. Document Revised: 11/21/2018 Document Reviewed: 11/21/2018 Elsevier Patient Education  [place].

## 2021-05-19 NOTE — Progress Notes (Signed)
Name: [name]   MRN: [id number]    DOB: July 06, 1940   Date:05/20/2021       Progress Note  Subjective  Chief Complaint  L Hand Problems  HPI  Fatigue: he lives in a group home and since early this week he has been lethargic, not eating much and dropping his food. Yesterday he went to see the hematologist for a regular follow up and bp was low at 79/54 pulse was 65 they gave him IV fluids and he is here today for follow up on hypotension.   Incontinence history of prostate cancer and left testicular swelling : he sees Urologist , and started on Trimpex two weeks ago, left testicle is still very red and swollen. He denies any pain , no fever or chills.    Patient Active Problem List   Diagnosis Date Noted   Acute blood loss anemia 05/09/2020   Rectal bleeding 05/09/2020   Hematochezia    HCAP (healthcare-associated pneumonia) 05/07/2020   BPH (benign prostatic hyperplasia)    Hyperlipidemia    Depression with anxiety    Acute metabolic encephalopathy    Protein-calorie malnutrition, severe 04/22/2020   Pressure injury of skin 04/22/2020   Multifocal pneumonia    Sepsis due to undetermined organism ([place]) 04/19/2020   Personal history of urinary calculi 06/21/2019   PAD (peripheral artery disease) ([place]) 07/04/2017   Aortic valve calcification 07/04/2017   Coronary artery calcification 07/04/2017   Late effect acute polio 05/17/2016   Chronic constipation 11/19/2015   Mild major depression ([place]) 11/19/2015   Bacteriuria, chronic 09/11/2015   History of prostate cancer 03/02/2015   Amyotrophia 03/02/2015   Edema leg 03/02/2015   Cerebral palsy ([place]) 03/02/2015   Dyslipidemia 03/02/2015   Dermatitis, eczematoid 03/02/2015   Divergent squint 03/02/2015   H/O acute poliomyelitis 03/02/2015   Lymphedema 03/02/2015   Intellectual disability 03/02/2015   Hypertrophy of nail 03/02/2015   Chronic venous insufficiency 03/02/2015   Avitaminosis D 03/02/2015   Adynamia  03/02/2015   Dependent on wheelchair 03/02/2015   Renal cysts, acquired, bilateral 03/02/2015   At risk for falling 03/02/2015   Cerebral vascular disease 03/02/2015   Strabismus 02/11/2015   Urge incontinence of urine 02/11/2015   Wheelchair dependence 02/11/2015   Urinary frequency 10/30/2014    Past Surgical History:  Procedure Laterality Date   COLONOSCOPY WITH PROPOFOL N/A 05/10/2020   Procedure: COLONOSCOPY WITH PROPOFOL;  Surgeon: Lin Landsman, MD;  Location: [place];  Service: Gastroenterology;  Laterality: N/A;   COLONOSCOPY WITH PROPOFOL N/A 05/12/2020   Procedure: COLONOSCOPY WITH PROPOFOL;  Surgeon: Lin Landsman, MD;  Location: [facility] ENDOSCOPY;  Service: Gastroenterology;  Laterality: N/A;   CYSTOSCOPY/URETEROSCOPY/HOLMIUM LASER/STENT PLACEMENT Right 08/14/2017   Procedure: CYSTOSCOPY/URETEROSCOPY/HOLMIUM LASER/STENT PLACEMENT;  Surgeon: Abbie Sons, MD;  Location: [facility] ORS;  Service: Urology;  Laterality: Right;   ESOPHAGOGASTRODUODENOSCOPY (EGD) WITH PROPOFOL N/A 05/12/2020   Procedure: ESOPHAGOGASTRODUODENOSCOPY (EGD) WITH PROPOFOL;  Surgeon: Lin Landsman, MD;  Location: [facility] ENDOSCOPY;  Service: Gastroenterology;  Laterality: N/A;   leg circulation surgery Right    PROSTATE SURGERY  11/10/2008   BRACHYTHERAPY    Family History  Problem Relation Age of Onset   Heart attack Brother    Heart disease Other     Social History   Tobacco Use   Smoking status: Never   Smokeless tobacco: Never  Substance Use Topics   Alcohol use: No    Alcohol/week: 0.0 standard drinks     Current Outpatient Medications:  acetaminophen (TYLENOL) 325 MG tablet, TAKE 2 TABLETS (650 MG TOTAL) BY MOUTH EVERY 6 HOURS AS NEEDED FOR MILD PAIN OR HEADACHE., Disp: 60 tablet, Rfl: 5   alfuzosin (UROXATRAL) 10 MG 24 hr tablet, TAKE 1 TABLET BY MOUTH DAILY, Disp: 90 tablet, Rfl: 1   ALPRAZolam (XANAX) 0.25 MG tablet, TAKE ONE TABLET BY MOUTH 2 TIMES A DAY AS  NEEDED FOR ANXIETY (WHEN BLOOD PRESSURE IS VERY HIGH OR ANXIOUS), Disp: 20 tablet, Rfl: 0   ARTIFICIAL TEARS 0.2-0.2-1 % SOLN, PLACE 2 DROPS INTO EYE 2 TIMES A DAY AS NEEDED, Disp: 15 mL, Rfl: 2   atorvastatin (LIPITOR) 40 MG tablet, TAKE 1 TABLET BY MOUTH EVERY EVENING, Disp: 90 tablet, Rfl: 1   augmented betamethasone dipropionate (DIPROLENE-AF) 0.05 % cream, APPLY TOPICALLY AS DIRECTED TO RASH AS NEEDED, Disp: 45 g, Rfl: 0   Calcium Carb-Cholecalciferol 500-10 MG-MCG TABS, TAKE 1 TABLET BY MOUTH 2 TIMES A DAY, Disp: 180 tablet, Rfl: 0   Cranberry Juice Powder 425 MG CAPS, TAKE 1 CAPSULE BY MOUTH ONCE DAILY, Disp: 90 capsule, Rfl: 1   Elastic Bandages & Supports (MEDICAL COMPRESSION STOCKINGS) MISC, 1 Units by Does not apply route daily., Disp: 2 each, Rfl: 0   GNP VITAMIN C 500 MG tablet, TAKE ONE TABLET BY MOUTH ONCE DAILY, Disp: 100 tablet, Rfl: 1   hydroxypropyl methylcellulose / hypromellose (ISOPTO TEARS / GONIOVISC) 2.5 % ophthalmic solution, Place 1 drop into both eyes 3 (three) times daily as needed for dry eyes., Disp: , Rfl:    Incontinence Supply Disposable (DEPEND SILHOUETTE BRIEFS L/XL) MISC, 1 each by Does not apply route 2 (two) times daily as needed., Disp: 60 each, Rfl: 5   Iron-FA-B Cmp-C-Biot-Probiotic (FUSION PLUS) CAPS, TAKE 1 CAPSULE BY MOUTH DAILY, Disp: 30 capsule, Rfl: 2   levofloxacin (LEVAQUIN) 500 MG tablet, Take 1 tablet (500 mg total) by mouth daily., Disp: 10 tablet, Rfl: 0   lubiprostone (AMITIZA) 24 MCG capsule, Take 1 capsule (24 mcg total) by mouth 2 (two) times daily with a meal., Disp: 60 capsule, Rfl: 5   Nutritional Supplements (ENSURE ORIGINAL) LIQD, Take 1 Can by mouth 3 (three) times daily., Disp: 5.688 mL, Rfl: 5   nystatin cream (MYCOSTATIN), Apply 1 application topically 2 (two) times daily., Disp: 30 g, Rfl: 0   sertraline (ZOLOFT) 100 MG tablet, TAKE 1 TABLET BY MOUTH DAILY, Disp: 90 tablet, Rfl: 1   sulfamethoxazole-trimethoprim (BACTRIM DS) 800-160  MG tablet, Take 1 tablet by mouth every 12 (twelve) hours., Disp: 28 tablet, Rfl: 0   trimethoprim (TRIMPEX) 100 MG tablet, Take 1 tablet (100 mg total) by mouth daily., Disp: 90 tablet, Rfl: 0  No Known Allergies  I personally reviewed active problem list, medication list, allergies, family history, social history, health maintenance with the patient/caregiver today.   ROS  Ten systems reviewed and is negative except as mentioned in HPI    Objective  Vitals:   05/20/21 0952 05/20/21 1024  BP: (!) 80/56   Pulse: (!) 42 (!) 53  Resp: 16   SpO2: 99%   Weight: 130 lb (59 kg)     Body mass index is 18.65 kg/m.  Physical Exam  Constitutional: Patient appears frail, sitting on wheelchair, awake and alert  No distress.  HEENT: head atraumatic, normocephalic,  neck supple, Cardiovascular: Normal rate, regular rhythm and normal heart sounds.  No murmur heard. No BLE edema. Pulmonary/Chest: Effort normal and breath sounds normal. No respiratory distress. Abdominal/testicular : Soft.  There  is no tenderness, very red, enlarged and hard left testicle, urine dripping from his urethra, wearing depends Psychiatric:cooperative Neurological: chronic atrophy of hands and legs, sitting on wheelchair, seems a little weaker on left upper extremity   Recent Results (from the past 2160 hour(s))  Urinalysis, Complete     Status: Abnormal   Collection Time: 03/25/21 11:22 AM  Result Value Ref Range   Specific Gravity, UA 1.020 1.005 - 1.030   pH, UA 7.5 5.0 - 7.5   Color, UA Yellow Yellow   Appearance Ur Cloudy (A) Clear   Leukocytes,UA 3+ (A) Negative   Protein,UA 2+ (A) Negative/Trace   Glucose, UA Negative Negative   Ketones, UA Negative Negative   RBC, UA 2+ (A) Negative   Bilirubin, UA Negative Negative   Urobilinogen, Ur 0.2 0.2 - 1.0 mg/dL   Nitrite, UA Positive (A) Negative   Microscopic Examination See below:   Microscopic Examination     Status: Abnormal   Collection Time:  03/25/21 11:22 AM   Urine  Result Value Ref Range   WBC, UA >30 (A) 0 - 5 /hpf   RBC None seen 0 - 2 /hpf   Epithelial Cells (non renal) 0-10 0 - 10 /hpf   Renal Epithel, UA None seen None seen /hpf   Bacteria, UA Many (A) None seen/Few  BLADDER SCAN AMB NON-IMAGING     Status: None   Collection Time: 03/25/21 11:26 AM  Result Value Ref Range   Scan Result 110ml   CULTURE, URINE COMPREHENSIVE     Status: Abnormal   Collection Time: 03/25/21 11:44 AM   Specimen: Urine   UR  Result Value Ref Range   Urine Culture, Comprehensive Final report (A)    Organism ID, Bacteria Morganella morganii (A)     Comment: Multi-Drug Resistant Organism Greater than 100,000 colony forming units per mL    Organism ID, Bacteria Not applicable    ANTIMICROBIAL SUSCEPTIBILITY Comment     Comment:       ** S = Susceptible; I = Intermediate; R = Resistant **                    P = Positive; N = Negative             MICS are expressed in micrograms per mL    Antibiotic                 RSLT#1    RSLT#2    RSLT#3    RSLT#4 Amoxicillin/Clavulanic Acid    R Ampicillin                     R Cefazolin                      R Cefuroxime                     R Ciprofloxacin                  S Ertapenem                      S Gentamicin                     S Imipenem                       S Levofloxacin  S Meropenem                      S Nitrofurantoin                 R Piperacillin/Tazobactam        S Tetracycline                   S Tobramycin                     S Trimethoprim/Sulfa             S   Calculi, with Photograph (to Clinical Lab)     Status: None   Collection Time: 03/25/21  1:11 PM  Result Value Ref Range   Source Calculi Comment     Comment: Not provided   Color Calculi Green    Size Calculi 13x7 mm    Comment: Multiple pieces received.  Dimensions of the largest piece reported.    Weight Calculi 566 mg   Composition Calculi Comment     Comment: Percentage  (Represents the % composition)   Carbonate Apatite 30 %   Mg NH4 PO4 (Struvite) 70 %   Comment Calculi 1 Comment     Comment: Calcium phosphate (carbonate form) includes carbonate apatite and carbonated amorphous calcium phosphate. Carbonated calcium phosphate is more commonly associated with urinary infection.    Photo Calculi Comment     Comment: Photograph will follow under a separate cover   Comment Calculi 3 Comment     Comment: Physician questions regarding Calculi Analysis contact [facility] at: [phone].    Please Note: Comment     Comment: Calculi report will follow via computer, mail or courier delivery.    DISCLAIMER: Comment     Comment: This test was developed and its performance characteristics determined by [facility].  It has not been cleared or approved by the Food and Drug Administration.   Urinalysis, Complete     Status: Abnormal   Collection Time: 04/20/21 11:11 AM  Result Value Ref Range   Specific Gravity, UA 1.020 1.005 - 1.030   pH, UA 8.5 (H) 5.0 - 7.5   Color, UA Yellow Yellow   Appearance Ur Turbid (A) Clear   Leukocytes,UA 2+ (A) Negative   Protein,UA 3+ (A) Negative/Trace   Glucose, UA Negative Negative   Ketones, UA Negative Negative   RBC, UA 3+ (A) Negative   Bilirubin, UA Negative Negative   Urobilinogen, Ur 1.0 0.2 - 1.0 mg/dL   Nitrite, UA Negative Negative   Microscopic Examination See below:   Microscopic Examination     Status: Abnormal   Collection Time: 04/20/21 11:11 AM   Urine  Result Value Ref Range   WBC, UA >30 (A) 0 - 5 /hpf   RBC >30 (A) 0 - 2 /hpf   Epithelial Cells (non renal) 0-10 0 - 10 /hpf   Crystals Present (A) N/A   Crystal Type Amorphous Sediment N/A    Comment: Triple Phosphate   Bacteria, UA Many (A) None seen/Few  CULTURE, URINE COMPREHENSIVE     Status: Abnormal   Collection Time: 04/20/21 11:35 AM   Specimen: Urine   UR  Result Value Ref Range   Urine Culture, Comprehensive Final report (A)     Organism ID, Bacteria Proteus mirabilis (A)     Comment: Multi-Drug Resistant Organism Greater than 100,000 colony forming units per mL    Organism ID, Bacteria Comment  Comment: Mixed urogenital flora 25,000-50,000 colony forming units per mL    ANTIMICROBIAL SUSCEPTIBILITY Comment     Comment:       ** S = Susceptible; I = Intermediate; R = Resistant **                    P = Positive; N = Negative             MICS are expressed in micrograms per mL    Antibiotic                 RSLT#1    RSLT#2    RSLT#3    RSLT#4 Amoxicillin/Clavulanic Acid    S Ampicillin                     S Cefazolin                      R Cefepime                       S Ceftriaxone                    S Cefuroxime                     S Ciprofloxacin                  S Ertapenem                      S Gentamicin                     S Levofloxacin                   S Meropenem                      S Nitrofurantoin                 R Piperacillin/Tazobactam        S Tetracycline                   R Tobramycin                     S Trimethoprim/Sulfa             S   Urinalysis, Complete     Status: Abnormal   Collection Time: 05/04/21  1:01 PM  Result Value Ref Range   Specific Gravity, UA 1.010 1.005 - 1.030   pH, UA 5.5 5.0 - 7.5   Color, UA Yellow Yellow   Appearance Ur Clear Clear   Leukocytes,UA 3+ (A) Negative   Protein,UA 1+ (A) Negative/Trace   Glucose, UA Negative Negative   Ketones, UA Negative Negative   RBC, UA 2+ (A) Negative   Bilirubin, UA Negative Negative   Urobilinogen, Ur 1.0 0.2 - 1.0 mg/dL   Nitrite, UA Negative Negative   Microscopic Examination See below:   CULTURE, URINE COMPREHENSIVE     Status: Abnormal   Collection Time: 05/04/21  1:01 PM   Specimen: Urine   UR  Result Value Ref Range   Urine Culture, Comprehensive Final report (A)    Organism ID, Bacteria Enterococcus faecalis (A)     Comment: For Enterococcus species, aminoglycosides (except for  high-level resistance screening), cephalosporins, clindamycin, and trimethoprim-sulfamethoxazole  are not effective clinically. (CLSI, M100-S26, 2016) Greater than 100,000 colony forming units per mL    ANTIMICROBIAL SUSCEPTIBILITY Comment     Comment:       ** S = Susceptible; I = Intermediate; R = Resistant **                    P = Positive; N = Negative             MICS are expressed in micrograms per mL    Antibiotic                 RSLT#1    RSLT#2    RSLT#3    RSLT#4 Ciprofloxacin                  R Levofloxacin                   R Nitrofurantoin                 S Penicillin                     S Tetracycline                   R Vancomycin                     S   Microscopic Examination     Status: Abnormal   Collection Time: 05/04/21  1:01 PM   Urine  Result Value Ref Range   WBC, UA 11-30 (A) 0 - 5 /hpf   RBC >30 (A) 0 - 2 /hpf   Epithelial Cells (non renal) 0-10 0 - 10 /hpf   Bacteria, UA Few None seen/Few  Iron and TIBC     Status: Abnormal   Collection Time: 05/19/21  9:52 AM  Result Value Ref Range   Iron 35 (L) 45 - 182 ug/dL   TIBC 237 (L) 250 - 450 ug/dL   Saturation Ratios 15 (L) 17.9 - 39.5 %   UIBC 202 ug/dL    Comment: Performed at [facility], [address] [place], [place] [address]  Ferritin     Status: None   Collection Time: 05/19/21  9:52 AM  Result Value Ref Range   Ferritin 135 24 - 336 ng/mL    Comment: Performed at [facility], [address] [place], [place] [address]  Vitamin B12     Status: Abnormal   Collection Time: 05/19/21  9:52 AM  Result Value Ref Range   Vitamin B-12 998 (H) 180 - 914 pg/mL    Comment: (NOTE) This assay is not validated for testing neonatal or myeloproliferative syndrome specimens for Vitamin B12 levels. Performed at [facility], [place] [address], [place], [place] [address]   CBC with Differential     Status: Abnormal   Collection Time: 05/19/21  9:52 AM  Result Value Ref Range    WBC 7.4 4.0 - 10.5 K/uL   RBC 3.32 (L) 4.22 - 5.81 MIL/uL   Hemoglobin 10.9 (L) 13.0 - 17.0 g/dL   HCT 32.1 (L) 39.0 - 52.0 %   MCV 96.7 80.0 - 100.0 fL   MCH 32.8 26.0 - 34.0 pg   MCHC 34.0 30.0 - 36.0 g/dL   RDW 13.5 11.5 - 15.5 %   Platelets 198 150 - 400 K/uL   nRBC 0.0 0.0 - 0.2 %   Neutrophils Relative %  78 %   Neutro Abs 5.9 1.7 - 7.7 K/uL   Lymphocytes Relative 13 %   Lymphs Abs 0.9 0.7 - 4.0 K/uL   Monocytes Relative 7 %   Monocytes Absolute 0.5 0.1 - 1.0 K/uL   Eosinophils Relative 1 %   Eosinophils Absolute 0.1 0.0 - 0.5 K/uL   Basophils Relative 0 %   Basophils Absolute 0.0 0.0 - 0.1 K/uL   Immature Granulocytes 1 %   Abs Immature Granulocytes 0.04 0.00 - 0.07 K/uL    Comment: Performed at [facility], [address] [place],  [address]     PHQ2/9: Depression screen [facility] 2/9 05/20/2021 08/19/2020 05/18/2020 03/01/2020 01/20/2020  Decreased Interest 0 0 0 0 0  Down, Depressed, Hopeless 0 0 0 2 0  PHQ - 2 Score 0 0 0 2 0  Altered sleeping 0 - - 0 -  Tired, decreased energy 0 - - 0 -  Change in appetite 0 - - 0 -  Feeling bad or failure about yourself  0 - - 0 -  Trouble concentrating 0 - - 0 -  Moving slowly or fidgety/restless 0 - - 0 -  Suicidal thoughts 0 - - 0 -  PHQ-9 Score 0 - - 2 -  Difficult doing work/chores - - - Somewhat difficult -  Some recent data might be hidden    phq 9 is negative   Fall Risk: Fall Risk  05/20/2021 08/19/2020 05/18/2020 03/01/2020 01/20/2020  Falls in the past year? 0 0 0 0 0  Number falls in past yr: 0 0 0 0 0  Injury with Fall? 0 0 0 0 0  Risk Factor Category  - - - - -  Risk for fall due to : Impaired mobility Impaired mobility - - Impaired mobility;Mental status change  Follow up Falls prevention discussed Falls prevention discussed - - -      Functional Status Survey: Is the patient deaf or have difficulty hearing?: No Does the patient have difficulty seeing, even when wearing glasses/contacts?: No Does  the patient have difficulty concentrating, remembering, or making decisions?: No Does the patient have difficulty walking or climbing stairs?: Yes Does the patient have difficulty dressing or bathing?: Yes Does the patient have difficulty doing errands alone such as visiting a doctor's office or shopping?: Yes    Assessment & Plan  1. Bradycardia  Called 911 for patient to be transported to [facility] by EMS, new onset bradycardia with hypotension  2. Hypotension, unspecified hypotension type  Did not respond to fluids it may be cardiac or sepsis as etiology   3. Lethargy   4. Lesion of testis  Needs to be followed up and possible cause of infection   5. Abnormal finding on diagnostic imaging of testicle

## 2021-05-19 NOTE — Telephone Encounter (Signed)
-----   Message from Steele Sizer, MD sent at 05/19/2021 12:33 PM EST ----- Needs follow up to recheck low bp  ----- Message ----- From: Verlon Au, NP Sent: 05/19/2021  10:43 AM EST To: Steele Sizer, MD  Patient was hypotension in clinic. Doesn't appear to be on blood pressure medications. We gave him a liter of fluids but I asked if they would follow up with you. Thanks and hope you're doing well.  Ander Purpura

## 2021-05-19 NOTE — Telephone Encounter (Signed)
Pt already has an appt tomorrow to see dr Ancil Boozer

## 2021-05-19 NOTE — Progress Notes (Signed)
Hematology/Oncology Consult Note [facility]  [phone] [phone]  Patient Care Team: Steele Sizer, MD as PCP - General (Family Medicine) Steele Sizer, MD (Family Medicine) Alvy Bimler, MD as Consulting Physician (Psychiatry) Abbie Sons, MD (Urology) Lorelee Cover., MD (Ophthalmology) Delana Meyer, Dolores Lory, MD (Vascular Surgery) Germaine Pomfret, [facility] (Pharmacist)   Name of the patient: [name]  [id number]  Dec 24, 1940   Date of visit: 05/19/21  Diagnosis-intermittent neutropenia  Chief complaint/ Reason for visit-routine follow-up of neutropenia, anemia, thrombocytopenia  Heme/Onc history: Patient is a 81 year old male was been referred to [place] for leukopenia.  Most recent CBC on 06/05/2018 showed a white count of 3.5, H&H of 12.5/35.9 and a platelet count of 131.  Differential on the CBC was essentially normal.  His past medical history significant for hypertension, cerebral palsy, history of  depression.  He lives in a nursing home and is unable to provide any history. He also sees urology for recurrent UTIs.   Results of blood work from 06/21/2018 were as follows: CBC showed white count of 5.5 with a normal differential and an ANC of 4.0.  H&H 12.8/37.1 and a platelet count of 158.  Hepatitis C testing was negative.  Hepatitis B negative also suggesting patient has not been vaccinated for it so far.  Iron studies were within normal limits.  Haptoglobin was normal.  TSH was normal.  Multiple myeloma panel showed no M protein.  Smear review showed adequate platelets and normal WBC and RBC morphology  Interval history- Patient is 81 year old male who returns to clinic for follow up for neutropenia. He had left epididymitis earlier this month and is being treated with trimethoprim x 90 days per urology. His caregiver from group home accompanies him today. He reports being increasingly tired this morning but otherwise  appears at baseline.   ECOG PS- 4  Review of systems- Review of Systems  Unable to perform ROS: Psychiatric disorder   No Known Allergies   Past Medical History:  Diagnosis Date   Anxiety    Atrophy, disuse, muscle    BPH (benign prostatic hyperplasia)    Cerebral palsy (HCC)    Eczema    Elevated lipids    Elevated PSA    Exotropia    Hyperlipidemia    Kidney stone    Leukopenia    Lower extremity edema    lymphedema   Mental retardation    Over weight    Prostate cancer ([place])    Venous insufficiency    Vitamin D deficiency    Wheelchair dependence     Past Surgical History:  Procedure Laterality Date   COLONOSCOPY WITH PROPOFOL N/A 05/10/2020   Procedure: COLONOSCOPY WITH PROPOFOL;  Surgeon: Lin Landsman, MD;  Location: [facility] ENDOSCOPY;  Service: Gastroenterology;  Laterality: N/A;   COLONOSCOPY WITH PROPOFOL N/A 05/12/2020   Procedure: COLONOSCOPY WITH PROPOFOL;  Surgeon: Lin Landsman, MD;  Location: [facility] ENDOSCOPY;  Service: Gastroenterology;  Laterality: N/A;   CYSTOSCOPY/URETEROSCOPY/HOLMIUM LASER/STENT PLACEMENT Right 08/14/2017   Procedure: CYSTOSCOPY/URETEROSCOPY/HOLMIUM LASER/STENT PLACEMENT;  Surgeon: Abbie Sons, MD;  Location: [facility] ORS;  Service: Urology;  Laterality: Right;   ESOPHAGOGASTRODUODENOSCOPY (EGD) WITH PROPOFOL N/A 05/12/2020   Procedure: ESOPHAGOGASTRODUODENOSCOPY (EGD) WITH PROPOFOL;  Surgeon: Lin Landsman, MD;  Location: [facility] ENDOSCOPY;  Service: Gastroenterology;  Laterality: N/A;   leg circulation surgery Right    PROSTATE SURGERY  11/10/2008   BRACHYTHERAPY    Social History   Socioeconomic History  Marital status: Single    Spouse name: Not on file   Number of children: Not on file   Years of education: Not on file   Highest education level: Not on file  Occupational History   Not on file  Tobacco Use   Smoking status: Never   Smokeless tobacco: Never  Vaping Use   Vaping Use: Never used  Substance  and Sexual Activity   Alcohol use: No    Alcohol/week: 0.0 standard drinks   Drug use: No   Sexual activity: Not Currently  Other Topics Concern   Not on file  Social History Narrative   Lives at [facility] group home, 5 males. Moved to higher level of care 04/28/19 with [name].     Social Determinants of Health   Financial Resource Strain: Low Risk    Difficulty of Paying Living Expenses: Not hard at all  Food Insecurity: No Food Insecurity   Worried About Charity fundraiser in the Last Year: Never true   [place] in the Last Year: Never true  Transportation Needs: No Transportation Needs   Lack of Transportation (Medical): No   Lack of Transportation (Non-Medical): No  Physical Activity: Inactive   Days of Exercise per Week: 0 days   Minutes of Exercise per Session: 0 min  Stress: No Stress Concern Present   Feeling of Stress : Not at all  Social Connections: Socially Isolated   Frequency of Communication with Friends and Family: Never   Frequency of Social Gatherings with Friends and Family: Never   Attends Religious Services: Never   Marine scientist or Organizations: No   Attends Music therapist: Never   Marital Status: Never married  Human resources officer Violence: Not At Risk   Fear of Current or Ex-Partner: No   Emotionally Abused: No   Physically Abused: No   Sexually Abused: No    Family History  Problem Relation Age of Onset   Heart attack Brother    Heart disease Other     Current Outpatient Medications:    acetaminophen (TYLENOL) 325 MG tablet, TAKE 2 TABLETS (650 MG TOTAL) BY MOUTH EVERY 6 HOURS AS NEEDED FOR MILD PAIN OR HEADACHE., Disp: 60 tablet, Rfl: 5   alfuzosin (UROXATRAL) 10 MG 24 hr tablet, TAKE 1 TABLET BY MOUTH DAILY, Disp: 90 tablet, Rfl: 1   ALPRAZolam (XANAX) 0.25 MG tablet, TAKE ONE TABLET BY MOUTH 2 TIMES A DAY AS NEEDED FOR ANXIETY (WHEN BLOOD PRESSURE IS VERY HIGH OR ANXIOUS), Disp: 20 tablet, Rfl: 0    ARTIFICIAL TEARS 0.2-0.2-1 % SOLN, PLACE 2 DROPS INTO EYE 2 TIMES A DAY AS NEEDED, Disp: 15 mL, Rfl: 2   atorvastatin (LIPITOR) 40 MG tablet, TAKE 1 TABLET BY MOUTH EVERY EVENING, Disp: 90 tablet, Rfl: 1   augmented betamethasone dipropionate (DIPROLENE-AF) 0.05 % cream, APPLY TOPICALLY AS DIRECTED TO RASH AS NEEDED, Disp: 45 g, Rfl: 0   Calcium Carb-Cholecalciferol 500-10 MG-MCG TABS, TAKE 1 TABLET BY MOUTH 2 TIMES A DAY, Disp: 180 tablet, Rfl: 0   Cranberry Juice Powder 425 MG CAPS, TAKE 1 CAPSULE BY MOUTH ONCE DAILY, Disp: 90 capsule, Rfl: 1   Elastic Bandages & Supports (MEDICAL COMPRESSION STOCKINGS) MISC, 1 Units by Does not apply route daily., Disp: 2 each, Rfl: 0   GNP VITAMIN C 500 MG tablet, TAKE ONE TABLET BY MOUTH ONCE DAILY, Disp: 100 tablet, Rfl: 1   hydroxypropyl methylcellulose / hypromellose (ISOPTO TEARS / GONIOVISC)  2.5 % ophthalmic solution, Place 1 drop into both eyes 3 (three) times daily as needed for dry eyes., Disp: , Rfl:    Incontinence Supply Disposable (DEPEND SILHOUETTE BRIEFS L/XL) MISC, 1 each by Does not apply route 2 (two) times daily as needed., Disp: 60 each, Rfl: 5   Iron-FA-B Cmp-C-Biot-Probiotic (FUSION PLUS) CAPS, TAKE 1 CAPSULE BY MOUTH DAILY, Disp: 30 capsule, Rfl: 2   levofloxacin (LEVAQUIN) 500 MG tablet, Take 1 tablet (500 mg total) by mouth daily., Disp: 10 tablet, Rfl: 0   lubiprostone (AMITIZA) 24 MCG capsule, Take 1 capsule (24 mcg total) by mouth 2 (two) times daily with a meal., Disp: 60 capsule, Rfl: 5   Nutritional Supplements (ENSURE ORIGINAL) LIQD, Take 1 Can by mouth 3 (three) times daily., Disp: 5.688 mL, Rfl: 5   nystatin cream (MYCOSTATIN), Apply 1 application topically 2 (two) times daily., Disp: 30 g, Rfl: 0   sertraline (ZOLOFT) 100 MG tablet, TAKE 1 TABLET BY MOUTH DAILY, Disp: 90 tablet, Rfl: 1   sulfamethoxazole-trimethoprim (BACTRIM DS) 800-160 MG tablet, Take 1 tablet by mouth every 12 (twelve) hours., Disp: 28 tablet, Rfl: 0    trimethoprim (TRIMPEX) 100 MG tablet, Take 1 tablet (100 mg total) by mouth daily., Disp: 90 tablet, Rfl: 0  Physical exam:  Vitals:   05/19/21 1007  BP: (!) 79/54  Pulse: 65  Resp: 20  Temp: 98.7 F (37.1 C)  SpO2: 100%  Weight: 130 lb (59 kg)    Physical Exam Constitutional:      Comments: Thin elderly gentleman.  Sitting in a wheelchair and appears in no acute distress  Eyes:     Pupils: Pupils are equal, round, and reactive to light.  Cardiovascular:     Rate and Rhythm: Normal rate and regular rhythm.     Heart sounds: Normal heart sounds.  Pulmonary:     Effort: Pulmonary effort is normal.     Breath sounds: Normal breath sounds.  Abdominal:     General: Bowel sounds are normal.     Palpations: Abdomen is soft.  Skin:    General: Skin is warm and dry.  Neurological:     Mental Status: He is alert.     CMP Latest Ref Rng & Units 06/24/2020  Glucose 65 - 99 mg/dL 156(H)  BUN 7 - 25 mg/dL 23  Creatinine 0.70 - 1.18 mg/dL 0.61(L)  Sodium 135 - 146 mmol/L 135  Potassium 3.5 - 5.3 mmol/L 3.9  Chloride 98 - 110 mmol/L 102  CO2 20 - 32 mmol/L 25  Calcium 8.6 - 10.3 mg/dL 8.8  Total Protein 6.5 - 8.1 g/dL -  Total Bilirubin 0.3 - 1.2 mg/dL -  Alkaline Phos 38 - 126 U/L -  AST 15 - 41 U/L -  ALT 0 - 44 U/L -   CBC Latest Ref Rng & Units 05/19/2021  WBC 4.0 - 10.5 K/uL 7.4  Hemoglobin 13.0 - 17.0 g/dL 10.9(L)  Hematocrit 39.0 - 52.0 % 32.1(L)  Platelets 150 - 400 K/uL 198   Iron/TIBC/Ferritin/ %Sat    Component Value Date/Time   IRON 72 06/11/2020 1119   TIBC 284 06/11/2020 1119   FERRITIN 45 06/11/2020 1119   IRONPCTSAT 25 06/11/2020 1119   Component Ref Range & Units 11 mo ago (06/11/20) 1 yr ago (05/10/20) 1 yr ago (05/10/20) 1 yr ago (08/29/19) 2 yr ago (05/24/18)  Vitamin B-12 232 - 1,245 pg/mL 547  774 R, CM   267 R, CM  308 R,  CM   Folate >3.0 ng/mL 7.9   5.8 Low  R, CM   10.5 R, CM      Assessment and plan- Patient is a 81 y.o. male who follows up  for mild pancytopenia.   Neutropenia- his white count has fluctuated between 4-10 for several years. Today, wbc 7.4 with anc 5.9 which is normal.   Thrombocytopenia- mild baseline thrombocytopenia which fluctuates between 120s-140s. Today is normal at 198.   Anemia- During hospitalization hemoglobin was between 9-11. Today it is stable at 10.9. Normocytic. Iron studies pending at time of visit. Suspect chronic disease etiology.   Hypotension- 79/54 in clinic. Symptomatic- 'tired'. HR is normal. Afebrile. Not on antihypertensives. Will give IV fluids in clinic and he can follow up with his PCP. If symptoms or pressures worsen, recommend ER.   Patient is accompanied by his caregiver today from group home where he resides. Patient has legal guardian, Anselm Jungling, his niece. I spoke to her by phone, reviewed findings and plan of care.   4 mo- lab (cbc, ferritin, iron studies, b12), Janese Banks- la  I discussed the assessment and treatment plan with the patient. The patient was provided an opportunity to ask questions and all were answered. The patient agreed with the plan and demonstrated an understanding of the instructions.   The patient was advised to call back or seek an in-person evaluation if the symptoms worsen or if the condition fails to improve as anticipated.   I spent 30 minutes face-to-face visit time dedicated to the care of this patient on the date of this encounter to including pre-visit review of hematology notes, urology note, face-to-face time with the patient, and post visit ordering of testing/documentation, and family updates.    Beckey Rutter, DNP, AGNP-C [place] at [facility]    Visit Diagnosis 1. Leukopenia, unspecified type   2. Neutropenia, unspecified type ([place])   3. Normocytic anemia   4. Thrombocytopenia ([place])   5. Hypotension, unspecified hypotension type    Beckey Rutter, DNP, AGNP-C [place] at [facility] [phone]  (clinic) 05/19/2021  CC: Dr Ancil Boozer

## 2021-05-20 ENCOUNTER — Emergency Department: Payer: Medicare Other

## 2021-05-20 ENCOUNTER — Observation Stay: Payer: Medicare Other

## 2021-05-20 ENCOUNTER — Other Ambulatory Visit: Payer: Self-pay

## 2021-05-20 ENCOUNTER — Ambulatory Visit (INDEPENDENT_AMBULATORY_CARE_PROVIDER_SITE_OTHER): Payer: Medicare Other | Admitting: Family Medicine

## 2021-05-20 ENCOUNTER — Encounter: Payer: Self-pay | Admitting: Family Medicine

## 2021-05-20 ENCOUNTER — Observation Stay
Admission: EM | Admit: 2021-05-20 | Discharge: 2021-05-21 | Disposition: A | Discharge status: Home / Self Care | Payer: Medicare Other | Attending: Internal Medicine | Admitting: Internal Medicine

## 2021-05-20 ENCOUNTER — Encounter: Payer: Self-pay | Admitting: Emergency Medicine

## 2021-05-20 VITALS — BP 80/56 | HR 53 | Resp 16 | Wt 130.0 lb

## 2021-05-20 DIAGNOSIS — N509 Disorder of male genital organs, unspecified: Secondary | ICD-10-CM | POA: Diagnosis not present

## 2021-05-20 DIAGNOSIS — Z8546 Personal history of malignant neoplasm of prostate: Secondary | ICD-10-CM | POA: Insufficient documentation

## 2021-05-20 DIAGNOSIS — Z8673 Personal history of transient ischemic attack (TIA), and cerebral infarction without residual deficits: Secondary | ICD-10-CM | POA: Insufficient documentation

## 2021-05-20 DIAGNOSIS — G809 Cerebral palsy, unspecified: Secondary | ICD-10-CM | POA: Diagnosis present

## 2021-05-20 DIAGNOSIS — Z20822 Contact with and (suspected) exposure to covid-19: Secondary | ICD-10-CM | POA: Insufficient documentation

## 2021-05-20 DIAGNOSIS — I959 Hypotension, unspecified: Secondary | ICD-10-CM

## 2021-05-20 DIAGNOSIS — R531 Weakness: Secondary | ICD-10-CM | POA: Diagnosis not present

## 2021-05-20 DIAGNOSIS — R001 Bradycardia, unspecified: Secondary | ICD-10-CM

## 2021-05-20 DIAGNOSIS — R29898 Other symptoms and signs involving the musculoskeletal system: Secondary | ICD-10-CM | POA: Diagnosis not present

## 2021-05-20 DIAGNOSIS — R93819 Abnormal radiologic findings on diagnostic imaging of unspecified testicle: Secondary | ICD-10-CM

## 2021-05-20 DIAGNOSIS — R52 Pain, unspecified: Secondary | ICD-10-CM

## 2021-05-20 DIAGNOSIS — E860 Dehydration: Secondary | ICD-10-CM | POA: Diagnosis not present

## 2021-05-20 DIAGNOSIS — T148XXA Other injury of unspecified body region, initial encounter: Secondary | ICD-10-CM

## 2021-05-20 DIAGNOSIS — R262 Difficulty in walking, not elsewhere classified: Secondary | ICD-10-CM | POA: Insufficient documentation

## 2021-05-20 DIAGNOSIS — R5383 Other fatigue: Secondary | ICD-10-CM | POA: Diagnosis not present

## 2021-05-20 LAB — CBC
HCT: 32.3 % — ABNORMAL LOW (ref 39.0–52.0)
HCT: 33.2 % — ABNORMAL LOW (ref 39.0–52.0)
Hemoglobin: 10.8 g/dL — ABNORMAL LOW (ref 13.0–17.0)
Hemoglobin: 11.1 g/dL — ABNORMAL LOW (ref 13.0–17.0)
MCH: 32.7 pg (ref 26.0–34.0)
MCH: 31.9 pg (ref 26.0–34.0)
MCHC: 33.4 g/dL (ref 30.0–36.0)
MCHC: 33.4 g/dL (ref 30.0–36.0)
MCV: 97.9 fL (ref 80.0–100.0)
MCV: 95.4 fL (ref 80.0–100.0)
Platelets: 218 10*3/uL (ref 150–400)
Platelets: 212 10*3/uL (ref 150–400)
RBC: 3.3 MIL/uL — ABNORMAL LOW (ref 4.22–5.81)
RBC: 3.48 MIL/uL — ABNORMAL LOW (ref 4.22–5.81)
RDW: 13.2 % (ref 11.5–15.5)
RDW: 13.2 % (ref 11.5–15.5)
WBC: 8.6 10*3/uL (ref 4.0–10.5)
WBC: 8.6 10*3/uL (ref 4.0–10.5)
nRBC: 0 % (ref 0.0–0.2)
nRBC: 0 % (ref 0.0–0.2)

## 2021-05-20 LAB — BASIC METABOLIC PANEL
Anion gap: 7 (ref 5–15)
BUN: 15 mg/dL (ref 8–23)
CO2: 24 mmol/L (ref 22–32)
Calcium: 8.7 mg/dL — ABNORMAL LOW (ref 8.9–10.3)
Chloride: 105 mmol/L (ref 98–111)
Creatinine, Ser: 0.55 mg/dL — ABNORMAL LOW (ref 0.61–1.24)
GFR, Estimated: >60 mL/min (ref >60)
Glucose, Bld: 98 mg/dL (ref 70–99)
Potassium: 4.4 mmol/L (ref 3.5–5.1)
Sodium: 136 mmol/L (ref 135–145)

## 2021-05-20 LAB — URINALYSIS, COMPLETE (UACMP) WITH MICROSCOPIC
Bilirubin Urine: NEGATIVE
Glucose, UA: NEGATIVE mg/dL
Ketones, ur: NEGATIVE mg/dL
Nitrite: NEGATIVE
Protein, ur: NEGATIVE mg/dL
Specific Gravity, Urine: <1.005 — ABNORMAL LOW (ref 1.005–1.030)
pH: 6.5 (ref 5.0–8.0)

## 2021-05-20 LAB — PROCALCITONIN: Procalcitonin: <0.1 ng/mL

## 2021-05-20 LAB — CREATININE, SERUM
Creatinine, Ser: 0.56 mg/dL — ABNORMAL LOW (ref 0.61–1.24)
GFR, Estimated: >60 mL/min (ref >60)

## 2021-05-20 LAB — CORTISOL: Cortisol, Plasma: 14.7 ug/dL

## 2021-05-20 LAB — TSH: TSH: 2.75 u[IU]/mL (ref 0.350–4.500)

## 2021-05-20 LAB — RESP PANEL BY RT-PCR (FLU A&B, COVID) ARPGX2
Influenza A by PCR: NEGATIVE
Influenza B by PCR: NEGATIVE
SARS Coronavirus 2 by RT PCR: NEGATIVE

## 2021-05-20 MED ORDER — ATORVASTATIN CALCIUM 20 MG PO TABS
40.0000 mg | ORAL_TABLET | Freq: Every evening | ORAL | Status: DC
  Administered 2021-05-20: 18:00:00 40 mg via ORAL
  Filled 2021-05-20 (×2): qty 2

## 2021-05-20 MED ORDER — ENOXAPARIN SODIUM 40 MG/0.4ML IJ SOSY
40.0000 mg | PREFILLED_SYRINGE | INTRAMUSCULAR | Status: DC
  Administered 2021-05-20: 40 mg via SUBCUTANEOUS
  Filled 2021-05-20: qty 0.4

## 2021-05-20 MED ORDER — TRIMETHOPRIM 100 MG PO TABS
100.0000 mg | ORAL_TABLET | Freq: Every day | ORAL | Status: DC
  Administered 2021-05-20 – 2021-05-21 (×2): 100 mg via ORAL
  Filled 2021-05-20 (×2): qty 1

## 2021-05-20 MED ORDER — ALFUZOSIN HCL ER 10 MG PO TB24
10.0000 mg | ORAL_TABLET | Freq: Every day | ORAL | Status: DC
  Administered 2021-05-20 – 2021-05-21 (×2): 10 mg via ORAL
  Filled 2021-05-20 (×2): qty 1

## 2021-05-20 MED ORDER — CRANBERRY JUICE POWDER 425 MG PO CAPS: 1.0000 | ORAL_CAPSULE | Freq: Every day | ORAL | Status: DC

## 2021-05-20 MED ORDER — ACETAMINOPHEN 325 MG PO TABS
650.0000 mg | ORAL_TABLET | ORAL | Status: DC | PRN
  Administered 2021-05-20: 22:00:00 650 mg via ORAL
  Filled 2021-05-20: qty 2

## 2021-05-20 MED ORDER — SODIUM CHLORIDE 0.9 % IV SOLN: INTRAVENOUS | Status: AC

## 2021-05-20 MED ORDER — ASCORBIC ACID 500 MG PO TABS
500.0000 mg | ORAL_TABLET | Freq: Every day | ORAL | Status: DC
  Administered 2021-05-20 – 2021-05-21 (×2): 500 mg via ORAL
  Filled 2021-05-20 (×2): qty 1

## 2021-05-20 MED ORDER — OYSTER SHELL CALCIUM/D3 500-5 MG-MCG PO TABS
1.0000 | ORAL_TABLET | Freq: Two times a day (BID) | ORAL | Status: DC
  Administered 2021-05-20 – 2021-05-21 (×2): 1 via ORAL
  Filled 2021-05-20 (×3): qty 1

## 2021-05-20 MED ORDER — ALPRAZOLAM 0.25 MG PO TABS
0.2500 mg | ORAL_TABLET | Freq: Two times a day (BID) | ORAL | Status: DC | PRN
  Administered 2021-05-20 – 2021-05-21 (×2): 0.25 mg via ORAL
  Filled 2021-05-20 (×2): qty 1

## 2021-05-20 MED ORDER — ACETAMINOPHEN 650 MG RE SUPP: 650.0000 mg | RECTAL | Status: DC | PRN

## 2021-05-20 MED ORDER — POLYVINYL ALCOHOL 1.4 % OP SOLN
1.0000 [drp] | OPHTHALMIC | Status: DC | PRN
  Filled 2021-05-20: qty 15

## 2021-05-20 MED ORDER — GLYCERIN-HYPROMELLOSE-PEG 400 0.2-0.2-1 % OP SOLN: 1.0000 [drp] | Freq: Every day | OPHTHALMIC | Status: DC | PRN

## 2021-05-20 MED ORDER — ENSURE ENLIVE PO LIQD
237.0000 mL | Freq: Three times a day (TID) | ORAL | Status: DC
  Administered 2021-05-20 – 2021-05-21 (×2): 237 mL via ORAL

## 2021-05-20 MED ORDER — LUBIPROSTONE 8 MCG PO CAPS
24.0000 ug | ORAL_CAPSULE | Freq: Two times a day (BID) | ORAL | Status: DC
  Administered 2021-05-20 – 2021-05-21 (×2): 24 ug via ORAL
  Filled 2021-05-20 (×3): qty 3
  Filled 2021-05-20 (×2): qty 1
  Filled 2021-05-20: qty 3
  Filled 2021-05-20: qty 1

## 2021-05-20 MED ORDER — ASPIRIN 81 MG PO CHEW
324.0000 mg | CHEWABLE_TABLET | Freq: Once | ORAL | Status: AC
  Administered 2021-05-20: 324 mg via ORAL
  Filled 2021-05-20: qty 4

## 2021-05-20 MED ORDER — ACETAMINOPHEN 160 MG/5ML PO SOLN
650.0000 mg | ORAL | Status: DC | PRN
  Filled 2021-05-20: qty 20.3

## 2021-05-20 MED ORDER — SERTRALINE HCL 50 MG PO TABS
100.0000 mg | ORAL_TABLET | Freq: Every day | ORAL | Status: DC
  Administered 2021-05-20 – 2021-05-21 (×2): 100 mg via ORAL
  Filled 2021-05-20 (×2): qty 2

## 2021-05-20 MED ORDER — HYPROMELLOSE (GONIOSCOPIC) 2.5 % OP SOLN: 1.0000 [drp] | Freq: Three times a day (TID) | OPHTHALMIC | Status: DC | PRN

## 2021-05-20 MED ORDER — SODIUM CHLORIDE 0.9 % IV SOLN
500.0000 mg | INTRAVENOUS | Status: DC
  Administered 2021-05-20: 500 mg via INTRAVENOUS
  Filled 2021-05-20: qty 5
  Filled 2021-05-20: qty 500

## 2021-05-20 MED ORDER — ASPIRIN 325 MG PO TABS
325.0000 mg | ORAL_TABLET | Freq: Every day | ORAL | Status: DC
  Administered 2021-05-21: 325 mg via ORAL
  Filled 2021-05-20: qty 1

## 2021-05-20 MED ORDER — SODIUM CHLORIDE 0.9 % IV BOLUS
1000.0000 mL | Freq: Once | INTRAVENOUS | Status: AC
  Administered 2021-05-20: 1000 mL via INTRAVENOUS

## 2021-05-20 MED ORDER — SODIUM CHLORIDE 0.9 % IV SOLN
2.0000 g | INTRAVENOUS | Status: DC
  Administered 2021-05-20: 18:00:00 2 g via INTRAVENOUS
  Filled 2021-05-20: qty 2
  Filled 2021-05-20: qty 20

## 2021-05-20 MED ORDER — ASPIRIN 300 MG RE SUPP: 300.0000 mg | Freq: Every day | RECTAL | Status: DC

## 2021-05-20 MED ORDER — ADULT MULTIVITAMIN W/MINERALS CH
1.0000 | ORAL_TABLET | Freq: Every day | ORAL | Status: DC
  Administered 2021-05-21: 09:00:00 1 via ORAL
  Filled 2021-05-20 (×2): qty 1

## 2021-05-20 NOTE — ED Notes (Signed)
[phone]--[facility] manager group home

## 2021-05-20 NOTE — H&P (Addendum)
History and Physical    [name] VEH:[id number] DOB: 11/01/1940 DOA: 05/20/2021  PCP: Steele Sizer, MD  Patient coming from: Home.  History obtained from patient's sister.  Chief Complaint: Hypotension and low heart rate.  HPI: [name] is a 81 y.o. male with history of cerebral palsy largely bedbound with contractures of his lower extremity with history of prostate cancer status of toes brachytherapy in 2010 being followed by urologist recently placed on trimethoprim for left-sided epididymitis since the last week of December 2022 was found to be hypotensive at home and was brought to the ER.  Patient was also found to be hypotensive yesterday at the hematologist office and as per the report was given fluids and discharged home.  Today when patient's niece who is also the patient's caregiver noted that patient was hypotensive and called her PCP was advised to come to the ER.  As per the report patient was not having any nausea vomiting diarrhea chest pain or shortness of breath.  ED Course: In the ER patient blood pressure was in the 83M systolic improved with fluid bolus normal saline 1 L.  At the time of my exam patient blood pressure was 629 systolic and heart rate around 70 bpm.  Patient was afebrile.  While in the ER it was noted that patient's left upper extremity was weak with mild pronator drift.  Per patient and the family he has been weak on the left upper extremity for the last 48 hours.  Denies any visual symptoms or difficulty speaking or swallowing.  Patient usually does not have good function of his lower extremities.  No history of cerebral palsy.  Did complain of some pain around his knee area.  CT head was unremarkable.  Labs are largely at baseline with COVID test negative.  Chest x-ray does show possibility of pneumonia.  Review of Systems: As per HPI, rest all negative.   Past Medical History:  Diagnosis Date   Anxiety    Atrophy, disuse, muscle     BPH (benign prostatic hyperplasia)    Cerebral palsy (HCC)    Eczema    Elevated lipids    Elevated PSA    Exotropia    Hyperlipidemia    Kidney stone    Leukopenia    Lower extremity edema    lymphedema   Mental retardation    Over weight    Prostate cancer ([name])    Venous insufficiency    Vitamin D deficiency    Wheelchair dependence     Past Surgical History:  Procedure Laterality Date   COLONOSCOPY WITH PROPOFOL N/A 05/10/2020   Procedure: COLONOSCOPY WITH PROPOFOL;  Surgeon: Lin Landsman, MD;  Location: [facility] ENDOSCOPY;  Service: Gastroenterology;  Laterality: N/A;   COLONOSCOPY WITH PROPOFOL N/A 05/12/2020   Procedure: COLONOSCOPY WITH PROPOFOL;  Surgeon: Lin Landsman, MD;  Location: [facility] ENDOSCOPY;  Service: Gastroenterology;  Laterality: N/A;   CYSTOSCOPY/URETEROSCOPY/HOLMIUM LASER/STENT PLACEMENT Right 08/14/2017   Procedure: CYSTOSCOPY/URETEROSCOPY/HOLMIUM LASER/STENT PLACEMENT;  Surgeon: Abbie Sons, MD;  Location: [facility] ORS;  Service: Urology;  Laterality: Right;   ESOPHAGOGASTRODUODENOSCOPY (EGD) WITH PROPOFOL N/A 05/12/2020   Procedure: ESOPHAGOGASTRODUODENOSCOPY (EGD) WITH PROPOFOL;  Surgeon: Lin Landsman, MD;  Location: [facility] ENDOSCOPY;  Service: Gastroenterology;  Laterality: N/A;   leg circulation surgery Right    PROSTATE SURGERY  11/10/2008   BRACHYTHERAPY     reports that he has never smoked. He has never used smokeless tobacco. He reports that he does not drink  alcohol and does not use drugs.  No Known Allergies  Family History  Problem Relation Age of Onset   Heart attack Brother    Heart disease Other     Prior to Admission medications   Medication Sig Start Date End Date Taking? Authorizing Provider  ALPRAZolam (XANAX) 0.25 MG tablet TAKE ONE TABLET BY MOUTH 2 TIMES A DAY AS NEEDED FOR ANXIETY (WHEN BLOOD PRESSURE IS VERY HIGH OR ANXIOUS) 06/02/19  Yes Sowles, Drue Stager, MD  nystatin cream (MYCOSTATIN) Apply 1 application  topically 2 (two) times daily. 05/04/21  Yes McGowan, Larene Beach A, PA-C  acetaminophen (TYLENOL) 325 MG tablet TAKE 2 TABLETS (650 MG TOTAL) BY MOUTH EVERY 6 HOURS AS NEEDED FOR MILD PAIN OR HEADACHE. 01/29/20   Steele Sizer, MD  alfuzosin (UROXATRAL) 10 MG 24 hr tablet TAKE 1 TABLET BY MOUTH DAILY 12/28/20   Steele Sizer, MD  ARTIFICIAL TEARS 0.2-0.2-1 % SOLN PLACE 2 DROPS INTO EYE 2 TIMES A DAY AS NEEDED 03/25/21   Ancil Boozer, Drue Stager, MD  atorvastatin (LIPITOR) 40 MG tablet TAKE 1 TABLET BY MOUTH EVERY EVENING 12/28/20   Ancil Boozer, Drue Stager, MD  augmented betamethasone dipropionate (DIPROLENE-AF) 0.05 % cream APPLY TOPICALLY AS DIRECTED TO RASH AS NEEDED 06/17/19   Steele Sizer, MD  Calcium Carb-Cholecalciferol 500-10 MG-MCG TABS TAKE 1 TABLET BY MOUTH 2 TIMES A DAY 03/28/21   Steele Sizer, MD  Cranberry Juice Powder 425 MG CAPS TAKE 1 CAPSULE BY MOUTH ONCE DAILY 12/28/20   Steele Sizer, MD  Elastic Bandages & Supports (MEDICAL COMPRESSION STOCKINGS) MISC 1 Units by Does not apply route daily. 06/02/19   Steele Sizer, MD  GNP VITAMIN C 500 MG tablet TAKE ONE TABLET BY MOUTH ONCE DAILY 04/22/21   Steele Sizer, MD  hydroxypropyl methylcellulose / hypromellose (ISOPTO TEARS / GONIOVISC) 2.5 % ophthalmic solution Place 1 drop into both eyes 3 (three) times daily as needed for dry eyes.    [provider]  Incontinence Supply Disposable (DEPEND SILHOUETTE BRIEFS L/XL) MISC 1 each by Does not apply route 2 (two) times daily as needed. 06/02/19   Steele Sizer, MD  Iron-FA-B Cmp-C-Biot-Probiotic (FUSION PLUS) CAPS TAKE 1 CAPSULE BY MOUTH DAILY 02/25/21   Vanga, Tally Due, MD  levofloxacin (LEVAQUIN) 500 MG tablet Take 1 tablet (500 mg total) by mouth daily. Patient not taking: Reported on 05/20/2021 04/20/21   Debroah Loop, PA-C  lubiprostone (AMITIZA) 24 MCG capsule Take 1 capsule (24 mcg total) by mouth 2 (two) times daily with a meal. 12/28/20   Steele Sizer, MD  Nutritional  Supplements (ENSURE ORIGINAL) LIQD Take 1 Can by mouth 3 (three) times daily. 01/18/21   Steele Sizer, MD  sertraline (ZOLOFT) 100 MG tablet TAKE 1 TABLET BY MOUTH DAILY 12/28/20   Steele Sizer, MD  sulfamethoxazole-trimethoprim (BACTRIM DS) 800-160 MG tablet Take 1 tablet by mouth every 12 (twelve) hours. Patient not taking: Reported on 05/20/2021 03/25/21   Zara Council A, PA-C  trimethoprim (TRIMPEX) 100 MG tablet Take 1 tablet (100 mg total) by mouth daily. 04/26/21 07/25/21  Debroah Loop, PA-C    Physical Exam: Constitutional: Moderately built and nourished. Vitals:   05/20/21 1120 05/20/21 1312 05/20/21 1400  BP: 116/76  137/86  Pulse: (!) 55 (!) 56 60  Resp: 18 14 17   Temp: 97.7 F (36.5 C)    TempSrc: Axillary    SpO2: 98% 100% 100%   Eyes: Anicteric no pallor. ENMT: No discharge from the ears eyes nose and mouth. Neck: No mass felt.  No neck rigidity. Respiratory: No rhonchi or crepitations. Cardiovascular: S1-S2 heard. Abdomen: Soft nontender bowel sounds present.  Scrotum appears mildly erythematous but I do not see any obvious tenderness on my exam.  Musculoskeletal: No edema. Skin: No rash. Neurologic: Alert awake oriented to name.  Left upper extremity has mild pronator drift with decreased grip strength.  Right upper extremities 5 x 5.  Lower extremities patient is having contractions.  Pupils are equal reacting to light no facial asymmetry.  Tongue is midline. Psychiatric: Has history of cerebral palsy is oriented to his name.   Labs on Admission: I have personally reviewed following labs and imaging studies  CBC: Recent Labs  Lab 05/19/21 0952 05/20/21 1126  WBC 7.4 8.6  NEUTROABS 5.9  --   HGB 10.9* 10.8*  HCT 32.1* 32.3*  MCV 96.7 97.9  PLT 198 397   Basic Metabolic Panel: Recent Labs  Lab 05/20/21 1126  NA 136  K 4.4  CL 105  CO2 24  GLUCOSE 98  BUN 15  CREATININE 0.55*  CALCIUM 8.7*   GFR: Estimated Creatinine Clearance: 63.5  mL/min (A) (by C-G formula based on SCr of 0.55 mg/dL (L)). Liver Function Tests: No results for input(s): AST, ALT, ALKPHOS, BILITOT, PROT, ALBUMIN in the last 168 hours. No results for input(s): LIPASE, AMYLASE in the last 168 hours. No results for input(s): AMMONIA in the last 168 hours. Coagulation Profile: No results for input(s): INR, PROTIME in the last 168 hours. Cardiac Enzymes: No results for input(s): CKTOTAL, CKMB, CKMBINDEX, TROPONINI in the last 168 hours. BNP (last 3 results) No results for input(s): PROBNP in the last 8760 hours. HbA1C: No results for input(s): HGBA1C in the last 72 hours. CBG: No results for input(s): GLUCAP in the last 168 hours. Lipid Profile: No results for input(s): CHOL, HDL, LDLCALC, TRIG, CHOLHDL, LDLDIRECT in the last 72 hours. Thyroid Function Tests: No results for input(s): TSH, T4TOTAL, FREET4, T3FREE, THYROIDAB in the last 72 hours. Anemia Panel: Recent Labs    05/19/21 0952  VITAMINB12 998*  FERRITIN 135  TIBC 237*  IRON 35*   Urine analysis:    Component Value Date/Time   COLORURINE YELLOW (A) 05/11/2020 0225   APPEARANCEUR Clear 05/04/2021 1301   LABSPEC 1.020 05/11/2020 0225   LABSPEC 1.012 05/25/2013 1506   PHURINE 6.0 05/11/2020 0225   GLUCOSEU Negative 05/04/2021 1301   GLUCOSEU Negative 05/25/2013 1506   HGBUR SMALL (A) 05/11/2020 0225   BILIRUBINUR Negative 05/04/2021 1301   BILIRUBINUR Negative 05/25/2013 1506   KETONESUR NEGATIVE 05/11/2020 0225   PROTEINUR 1+ (A) 05/04/2021 1301   PROTEINUR NEGATIVE 05/11/2020 0225   UROBILINOGEN 0.2 10/03/2018 1032   NITRITE Negative 05/04/2021 1301   NITRITE NEGATIVE 05/11/2020 0225   LEUKOCYTESUR 3+ (A) 05/04/2021 1301   LEUKOCYTESUR MODERATE (A) 05/11/2020 0225   LEUKOCYTESUR Negative 05/25/2013 1506   Sepsis Labs: @LABRCNTIP (procalcitonin:4,lacticidven:4) ) Recent Results (from the past 240 hour(s))  Resp Panel by RT-PCR (Flu A&B, Covid) Nasopharyngeal Swab      Status: None   Collection Time: 05/20/21  1:00 PM   Specimen: Nasopharyngeal Swab; Nasopharyngeal(NP) swabs in vial transport medium  Result Value Ref Range Status   SARS Coronavirus 2 by RT PCR NEGATIVE NEGATIVE Final    Comment: (NOTE) SARS-CoV-2 target nucleic acids are NOT DETECTED.  The SARS-CoV-2 RNA is generally detectable in upper respiratory specimens during the acute phase of infection. The lowest concentration of SARS-CoV-2 viral copies this assay can detect is 138 copies/mL. A negative  result does not preclude SARS-Cov-2 infection and should not be used as the sole basis for treatment or other patient management decisions. A negative result may occur with  improper specimen collection/handling, submission of specimen other than nasopharyngeal swab, presence of viral mutation(s) within the areas targeted by this assay, and inadequate number of viral copies(<138 copies/mL). A negative result must be combined with clinical observations, patient history, and epidemiological information. The expected result is Negative.  Fact Sheet for Patients:  EntrepreneurPulse.com.au  Fact Sheet for Healthcare Providers:  IncredibleEmployment.be  This test is no t yet approved or cleared by the [place] FDA and  has been authorized for detection and/or diagnosis of SARS-CoV-2 by FDA under an Emergency Use Authorization (EUA). This EUA will remain  in effect (meaning this test can be used) for the duration of the COVID-19 declaration under Section 564(b)(1) of the Act, 21 U.S.C.section 360bbb-3(b)(1), unless the authorization is terminated  or revoked sooner.       Influenza A by PCR NEGATIVE NEGATIVE Final   Influenza B by PCR NEGATIVE NEGATIVE Final    Comment: (NOTE) The Xpert Xpress SARS-CoV-2/FLU/RSV plus assay is intended as an aid in the diagnosis of influenza from Nasopharyngeal swab specimens and should not be used as a sole basis  for treatment. Nasal washings and aspirates are unacceptable for Xpert Xpress SARS-CoV-2/FLU/RSV testing.  Fact Sheet for Patients: EntrepreneurPulse.com.au  Fact Sheet for Healthcare Providers: IncredibleEmployment.be  This test is not yet approved or cleared by the [place] FDA and has been authorized for detection and/or diagnosis of SARS-CoV-2 by FDA under an Emergency Use Authorization (EUA). This EUA will remain in effect (meaning this test can be used) for the duration of the COVID-19 declaration under Section 564(b)(1) of the Act, 21 U.S.C. section 360bbb-3(b)(1), unless the authorization is terminated or revoked.  Performed at [facility], [address] [place], [place] [address]      Radiological Exams on Admission: CT Head Wo Contrast  Result Date: 05/20/2021 CLINICAL DATA:  Mental status change, unknown cause EXAM: CT HEAD WITHOUT CONTRAST TECHNIQUE: Contiguous axial images were obtained from the base of the skull through the vertex without intravenous contrast. RADIATION DOSE REDUCTION: This exam was performed according to the departmental dose-optimization program which includes automated exposure control, adjustment of the mA and/or kV according to patient size and/or use of iterative reconstruction technique. COMPARISON:  05/07/2020 FINDINGS: Brain: There is no acute intracranial hemorrhage, mass effect, or edema. Gray-white differentiation is preserved. There is no extra-axial fluid collection. Ventricles and sulci stable in size and configuration. Patchy hypoattenuation in the supratentorial white matter probably reflects stable chronic microvascular ischemic changes. Vascular: There is atherosclerotic calcification at the skull base. Skull: Calvarium is unremarkable. Sinuses/Orbits: Mild paranasal sinus mucosal thickening. No acute orbital abnormality. Other: None. IMPRESSION: No acute intracranial abnormality or  significant change since recent prior study. Electronically Signed   By: Macy Mis M.D.   On: 05/20/2021 13:12   DG Chest Portable 1 View  Result Date: 05/20/2021 CLINICAL DATA:  Fatigue EXAM: PORTABLE CHEST 1 VIEW COMPARISON:  Chest x-ray 05/07/2020 FINDINGS: Heart size is normal. Mediastinum appears stable. Tortuous and likely ectatic thoracic aorta. Focal hazy opacity in the left lower lung zone. No pleural effusion or pneumothorax identified. Degenerative changes of the shoulders. IMPRESSION: Focal hazy opacity in the left lower lung zone new since previous study which could represent pneumonia. Electronically Signed   By: Ofilia Neas M.D.   On: 05/20/2021 13:39  EKG: Independently reviewed.  Will get repeat EKG has  Artifacts.  Assessment/Plan Principal Problem:   Weakness of left upper extremity Active Problems:   History of prostate cancer   Cerebral palsy (HCC)   Hypotension    Left upper extremity weakness -Per report has been ongoing for last 48 hours.  Discussed with on-call neurologist Dr. Cheral Marker.  Plan is to get stat MRI of the brain and C-spine and if it is positive for stroke will need further work-up and consult neurology.  Patient did pass stroke swallow we will keep her on neurochecks for now. Hypotensive episodes cause not clear.  Improved with fluids.  Patient is not on any high antihypertensives.  Will check cortisol level gently hydrate. Recent sonogram of the scrotum did show complex lesion in the left scrotum.  Patient is on antibiotics for left epididymitis placed by patient's urologist.  We will consult urology for further input.  Addendum -discussed with urologist Dr. Erlene Quan who is on-call.  Dr. Erlene Quan advised no further work-up at this time since the sonogram shows features concerning for complex hydrocele is see clearly of previous infection. Chest x-ray showing possible pneumonia.  We will keep patient on empiric antibiotics and check procalcitonin  and chest x-ray 2 views.  Based on which we will have further plans. History of hyperlipidemia on statins. Anemia being followed by hematologist. History of prostate cancer status post brachytherapy in 2010 being followed by urologist. Complains of pain in the both knee area for which I have ordered x-rays.   DVT prophylaxis: Lovenox. Code Status: Full code. Family Communication: Patient's sister. Disposition Plan: To be determined. Consults called: Discussed with neurologist.  We will consult urology for the left scrotal lesion. Admission status: Observation.   Rise Patience MD [facility] Pager [phone].  If 7PM-7AM, please contact night-coverage [email or web address] Password [facility]  05/20/2021, 3:37 PM

## 2021-05-20 NOTE — ED Notes (Signed)
POA  [name]  [phone]

## 2021-05-20 NOTE — ED Triage Notes (Signed)
Pt to ED [facility] from [facility] medical clinic. MD wants evaluated for possible sepsis. Has been hypotensive for past 2 days and bradycardic.  118/76, 118/68 pressure with EMS. Temp 98.1. no sepsis alert ems.  Oriented to normal status per eMS.

## 2021-05-20 NOTE — ED Provider Notes (Signed)
[facility] Provider Note    Event Date/Time   First MD Initiated Contact with Patient 05/20/21 1235     (approximate)   History   Hypotension and Bradycardia   HPI  [name] is a 81 y.o. male with a past history of cerebral palsy, wheelchair dependence, stroke who was sent to the ED from primary care due to concern for fever and hypotension.  EMS report that for them vitals were normal.  On arrival, patient's vital signs are normal.  Patient states that he feels fine, denies pain or any other acute complaints.  EMS report that in their experience Patient is at baseline mental status  Medical clinic reported a blood pressure of 80/56.     Physical Exam   Triage Vital Signs: ED Triage Vitals [05/20/21 1120]  Enc Vitals Group     BP 116/76     Pulse Rate (!) 55     Resp 18     Temp 97.7 F (36.5 C)     Temp Source Axillary     SpO2 98 %     Weight      Height      Head Circumference      Peak Flow      Pain Score 0     Pain Loc      Pain Edu?      Excl. in [place]?     Most recent vital signs: Vitals:   05/20/21 1312 05/20/21 1400  BP:  137/86  Pulse: (!) 56 60  Resp: 14 17  Temp:    SpO2: 100% 100%     General: Awake, no distress.  CV:  Good peripheral perfusion.  Regular rate and rhythm, heart rate of 60 Resp:  Normal effort.  Unlabored, clear to auscultation bilaterally Abd:  No distention.  Soft and nontender.  Dark brown stool, Hemoccult negative. Other:  No peripheral edema, no inflammatory soft tissue changes.  Dry mucous membranes. NIH stroke scale = 1 (acute) for LUE   ED Results / Procedures / Treatments   Labs (all labs ordered are listed, but only abnormal results are displayed) Labs Reviewed  BASIC METABOLIC PANEL - Abnormal; Notable for the following components:      Result Value   Creatinine, Ser 0.55 (*)    Calcium 8.7 (*)    All other components within normal limits  CBC - Abnormal; Notable for the  following components:   RBC 3.30 (*)    Hemoglobin 10.8 (*)    HCT 32.3 (*)    All other components within normal limits  RESP PANEL BY RT-PCR (FLU A&B, COVID) [id number]  URINALYSIS, ROUTINE W REFLEX MICROSCOPIC  CBG MONITORING, ED     EKG  Interpreted by me Sinus bradycardia rate of 56.  Normal axis, normal intervals.  Normal QRS ST segments and T waves.  No acute ischemic changes   RADIOLOGY Chest x-ray viewed and interpreted by me, appears unremarkable.  Radiology report reviewed.  CT head unremarkable.    PROCEDURES:  Critical Care performed: No  Procedures   MEDICATIONS ORDERED IN ED: Medications  aspirin chewable tablet 324 mg (has no administration in time range)  sodium chloride 0.9 % bolus 1,000 mL (1,000 mLs Intravenous New Bag/Given 05/20/21 1323)     IMPRESSION / MDM / ASSESSMENT AND PLAN / ED COURSE  I reviewed the triage vital signs and the nursing notes.  Differential diagnosis includes, but is not limited to, intracranial hemorrhage, dehydration, electrolyte abnormality, pneumonia, UTI, viral illness, vagal episode     Patient presents with decreased energy level, concern for hypotension at primary care.  Here his vital signs are normal, exam is nonfocal.  Will check labs chest x-ray CT head  ----------------------------------------- 2:43 PM on 05/20/2021 ----------------------------------------- Labs chest x-ray and CT head unremarkable.  Care discussed with the patient's legal guardian, his niece, at bedside.  They note that he has been having left arm weakness since yesterday.  On direct questioning the patient agrees that his left arm feels weak.  This is his dominant hand.  He is able to briefly lift the left arm against gravity, but demonstrates impaired coordination.  Case discussed with hospitalist for stroke work-up.  He passed a swallow screen.        FINAL CLINICAL IMPRESSION(S) / ED DIAGNOSES   Final  diagnoses:  Left arm weakness  Dehydration  Cerebral palsy, unspecified type ([place])     Rx / DC Orders   ED Discharge Orders     None        Note:  This document was prepared using Dragon voice recognition software and may include unintentional dictation errors.   Carrie Mew, MD 05/20/21 1447

## 2021-05-20 NOTE — ED Triage Notes (Signed)
Pt via EMS from [facility], pt is alert but oriented to only self and placed. Medical clinic reported a BP of 80/56 and a HR of 54. Pt is at his baseline per EMS   Notes from d/c summary reports pt has been lethargic and has not been eating well. NAD

## 2021-05-21 ENCOUNTER — Observation Stay: Payer: Medicare Other

## 2021-05-21 DIAGNOSIS — R29898 Other symptoms and signs involving the musculoskeletal system: Secondary | ICD-10-CM | POA: Diagnosis not present

## 2021-05-21 DIAGNOSIS — I959 Hypotension, unspecified: Secondary | ICD-10-CM | POA: Diagnosis not present

## 2021-05-21 LAB — COMPREHENSIVE METABOLIC PANEL
ALT: 15 U/L (ref 0–44)
AST: 13 U/L — ABNORMAL LOW (ref 15–41)
Albumin: 2.6 g/dL — ABNORMAL LOW (ref 3.5–5.0)
Alkaline Phosphatase: 84 U/L (ref 38–126)
Anion gap: 8 (ref 5–15)
BUN: 12 mg/dL (ref 8–23)
CO2: 23 mmol/L (ref 22–32)
Calcium: 8.7 mg/dL — ABNORMAL LOW (ref 8.9–10.3)
Chloride: 109 mmol/L (ref 98–111)
Creatinine, Ser: 0.52 mg/dL — ABNORMAL LOW (ref 0.61–1.24)
GFR, Estimated: >60 mL/min (ref >60)
Glucose, Bld: 84 mg/dL (ref 70–99)
Potassium: 3.8 mmol/L (ref 3.5–5.1)
Sodium: 140 mmol/L (ref 135–145)
Total Bilirubin: 0.3 mg/dL (ref 0.3–1.2)
Total Protein: 6.1 g/dL — ABNORMAL LOW (ref 6.5–8.1)

## 2021-05-21 LAB — CBC
HCT: 29.2 % — ABNORMAL LOW (ref 39.0–52.0)
Hemoglobin: 9.8 g/dL — ABNORMAL LOW (ref 13.0–17.0)
MCH: 31.9 pg (ref 26.0–34.0)
MCHC: 33.6 g/dL (ref 30.0–36.0)
MCV: 95.1 fL (ref 80.0–100.0)
Platelets: 191 10*3/uL (ref 150–400)
RBC: 3.07 MIL/uL — ABNORMAL LOW (ref 4.22–5.81)
RDW: 13.3 % (ref 11.5–15.5)
WBC: 6.7 10*3/uL (ref 4.0–10.5)
nRBC: 0 % (ref 0.0–0.2)

## 2021-05-21 NOTE — Evaluation (Signed)
Physical Therapy Evaluation Patient Details Name: [name] MRN: [id number] DOB: Jun 27, 1940 Today's Date: 05/21/2021  History of Present Illness  Pt is a 81 y/o M with PMH: CP, ID, prostate Cancer, HLD, and BPH, who presented to ED via [facility] from [facility] medical clinic for evaluation of possible sepsis, hypotensive x2 days. MD assessment included noting weakness in L UE with pronator drift.  Clinical Impression  Patient resting in bed upon arrival to room; alert and oriented to self.  Follows simple commands, generally responsive and interactive with therapist throughout session.  Denies pain.  Generally weak and deconditioned throughout bilat UEs; visual deformity to L shoulder noted during assessment with palpable displacement of humeral head to glenoid fossa.  Imaging obtained per physician; significant for "high riding" humeral head (suggesting of RTC pathology), but no formal dislocation.  Bilat UEs with significant contrature; very minimal ability to indep mobilize. Maintains R LE in position of hip IR/adduct, knee flexed; L LE generally rigid in extension.  High risk for skin breakfown between knees; areas padded for skin protection.  Appears to be dep for all repositioning and mobility tasks, comparable to baseline level of ability referenced in TOC notes (hoyer lift, WC-unable to self-propel and hospital bed).  No acute change in status identified; no acute PT needs noted, as patient appears largely at baseline level of functional ability.  Will complete PT order at this time.  Please re-consult should needs change.     Recommendations for follow up therapy are one component of a multi-disciplinary discharge planning process, led by the attending physician.  Recommendations may be updated based on patient status, additional functional criteria and insurance authorization.  Follow Up Recommendations No PT follow up    Assistance Recommended at Discharge Frequent or constant  Supervision/Assistance  Patient can return home with the following  A lot of help with walking and/or transfers;A lot of help with bathing/dressing/bathroom;Direct supervision/assist for medications management;Assist for transportation;Assistance with cooking/housework;Assistance with feeding    Equipment Recommendations    Recommendations for Other Services       Functional Status Assessment Patient has not had a recent decline in their functional status     Precautions / Restrictions Precautions Precautions: Fall Restrictions Weight Bearing Restrictions: No      Mobility  Bed Mobility Overal bed mobility: Needs Assistance Bed Mobility: Rolling Rolling: Total assist         General bed mobility comments: based on clinical observation of strength/ROM    Transfers                   General transfer comment: deferred    Ambulation/Gait               General Gait Details: deferred  Stairs            Wheelchair Mobility    Modified Rankin (Stroke Patients Only)       Balance                                             Pertinent Vitals/Pain Pain Assessment Pain Assessment: No/denies pain    Home Living Family/patient expects to be discharged to:: Group home                   Additional Comments: Attempted to phone group home to confirm baseline abilities; unable to connect with caregiver/representative.  Per TOC, patient with hospital bed, hoyer lift and WC at facility.    Prior Function Prior Level of Function : Needs assist       Physical Assist : Mobility (physical) Mobility (physical): Bed mobility;Transfers   Mobility Comments: Per notes through TOC, hoyer lift for transfers, WC level for mobility (unable to self-propel) ADLs Comments: unable to reach facility via telephone, unsure if pt is able to self-feed, reasonable to assume that since chart indicates he was requiring hoyer lift for transfers, he  waws likely requiring significant help for ADLs.     Hand Dominance        Extremity/Trunk Assessment   Upper Extremity Assessment Upper Extremity Assessment: Generalized weakness LUE Deficits / Details: concerns regarding L shoulder ROM/potential displacement. Noted to appear large and poorly aligning with shoulder socket/glenoid fossa.    Lower Extremity Assessment Lower Extremity Assessment: Generalized weakness (significant contracture to bilat LEs. R LE rests in position of hip IR and adduct, hip/knee flexion (unable to fully extend due to contrature), R ankle to neutral passively; L LE  generally rigid extension throughout hip and knee, L ankle PF contracture)       Communication   Communication:  (slightly garbled, but able to make basic needs known)  Cognition Arousal/Alertness: Awake/alert Behavior During Therapy: WFL for tasks assessed/performed Overall Cognitive Status: History of cognitive impairments - at baseline                                 General Comments: pleasant and cooperative, he seems to answer most questions appropriately, but difficult to full discern 2/2 garbled speech; follows sipmle commands        General Comments      Exercises     Assessment/Plan    PT Assessment Patient does not need any further PT services  PT Problem List         PT Treatment Interventions      PT Goals (Current goals can be found in the Care Plan section)  Acute Rehab PT Goals PT Goal Formulation: All assessment and education complete, DC therapy    Frequency       Co-evaluation               AM-PAC PT "6 Clicks" Mobility  Outcome Measure Help needed turning from your back to your side while in a flat bed without using bedrails?: Total Help needed moving from lying on your back to sitting on the side of a flat bed without using bedrails?: Total Help needed moving to and from a bed to a chair (including a wheelchair)?: Total Help  needed standing up from a chair using your arms (e.g., wheelchair or bedside chair)?: Total Help needed to walk in hospital room?: Total Help needed climbing 3-5 steps with a railing? : Total 6 Click Score: 6    End of Session   Activity Tolerance: Patient tolerated treatment well Patient left: in bed;with bed alarm set;with call bell/phone within reach Nurse Communication: Mobility status PT Visit Diagnosis: Muscle weakness (generalized) (M62.81);Difficulty in walking, not elsewhere classified (R26.2);Pain Pain - Right/Left: Left Pain - part of body: Hip    Time: 6568-1275 PT Time Calculation (min) (ACUTE ONLY): 11 min   Charges:   PT Evaluation $PT Eval Moderate Complexity: 1 Mod          Zonnique Norkus H. Owens Shark, PT, DPT, NCS 05/21/21, 5:43 PM [phone]

## 2021-05-21 NOTE — TOC Progression Note (Signed)
Transition of Care [facility]) - Progression Note    Patient Details  Name: [name] MRN: [id number] Date of Birth: [birth year]  Transition of [facility]) CM/SW [place], RN Phone Number: 05/21/2021, 3:10 PM  Clinical Narrative:   Called EMS to transport the patient to the group home, there is 1 ahead of him and they will pick him up asap, his niece Wells Guiles is aware          Expected Discharge Plan and Services           Expected Discharge Date: 05/21/21                                     Social Determinants of Health (SDOH) Interventions    Readmission Risk Interventions No flowsheet data found.

## 2021-05-21 NOTE — NC FL2 (Signed)
[place] LEVEL OF CARE SCREENING TOOL     IDENTIFICATION  Patient Name: [name] Birthdate: January 25, 1941 Sex: male Admission Date (Current Location): 05/20/2021  [place] and [place] Number:  Engineering geologist and Address:  [facility], [address], [place], [place] [address]      Provider Number: [id number]  Attending Physician Name and Address:  Antonieta Pert, MD  Relative Name and Phone Number:  Wells Guiles [phone]    Current Level of Care: Hospital Recommended Level of Care: Other (Comment) (Group Home) Prior Approval Number:    Date Approved/Denied:   PASRR Number:    Discharge Plan: Other (Comment) (Group Home)    Current Diagnoses: Patient Active Problem List   Diagnosis Date Noted   Weakness of left upper extremity 05/20/2021   Hypotension 05/20/2021   Acute blood loss anemia 05/09/2020   Rectal bleeding 05/09/2020   Hematochezia    HCAP (healthcare-associated pneumonia) 05/07/2020   BPH (benign prostatic hyperplasia)    Hyperlipidemia    Depression with anxiety    Acute metabolic encephalopathy    Protein-calorie malnutrition, severe 04/22/2020   Pressure injury of skin 04/22/2020   Multifocal pneumonia    Sepsis due to undetermined organism ([place]) 04/19/2020   Personal history of urinary calculi 06/21/2019   PAD (peripheral artery disease) ([place]) 07/04/2017   Aortic valve calcification 07/04/2017   Coronary artery calcification 07/04/2017   Late effect acute polio 05/17/2016   Chronic constipation 11/19/2015   Mild major depression ([place]) 11/19/2015   Bacteriuria, chronic 09/11/2015   History of prostate cancer 03/02/2015   Amyotrophia 03/02/2015   Edema leg 03/02/2015   Cerebral palsy ([place]) 03/02/2015   Dyslipidemia 03/02/2015   Dermatitis, eczematoid 03/02/2015   Divergent squint 03/02/2015   H/O acute poliomyelitis 03/02/2015   Lymphedema 03/02/2015   Intellectual disability 03/02/2015    Hypertrophy of nail 03/02/2015   Chronic venous insufficiency 03/02/2015   Avitaminosis D 03/02/2015   Adynamia 03/02/2015   Dependent on wheelchair 03/02/2015   Renal cysts, acquired, bilateral 03/02/2015   At risk for falling 03/02/2015   Cerebral vascular disease 03/02/2015   Strabismus 02/11/2015   Urge incontinence of urine 02/11/2015   Wheelchair dependence 02/11/2015   Urinary frequency 10/30/2014    Orientation RESPIRATION BLADDER Height & Weight     Self  Normal Incontinent Weight:   Height:     BEHAVIORAL SYMPTOMS/MOOD NEUROLOGICAL BOWEL NUTRITION STATUS      Incontinent Diet (regular)  AMBULATORY STATUS COMMUNICATION OF NEEDS Skin   Total Care Verbally Normal, Other (Comment) (stage 1 presure ulcer, bilateral hip, left heel)                       Personal Care Assistance Level of Assistance  Total care       Total Care Assistance: Maximum assistance   Functional Limitations Info             SPECIAL CARE FACTORS FREQUENCY                       Contractures Contractures Info: Not present    Additional Factors Info  Code Status, Allergies Code Status Info: full Allergies Info: NKDA           Current Medications (05/21/2021):  This is the current hospital active medication list Current Facility-Administered Medications  Medication Dose Route Frequency Provider Last Rate Last Admin   0.9 %  sodium chloride infusion  Intravenous Continuous Rise Patience, MD   Stopped at 05/21/21 1109   acetaminophen (TYLENOL) tablet 650 mg  650 mg Oral Q4H PRN Rise Patience, MD   650 mg at 05/20/21 2136   Or   acetaminophen (TYLENOL) 160 MG/5ML solution 650 mg  650 mg Per Tube Q4H PRN Rise Patience, MD       Or   acetaminophen (TYLENOL) suppository 650 mg  650 mg Rectal Q4H PRN Rise Patience, MD       alfuzosin (UROXATRAL) 24 hr tablet 10 mg  10 mg Oral Daily Rise Patience, MD   10 mg at 05/21/21 0919   ALPRAZolam  (XANAX) tablet 0.25 mg  0.25 mg Oral BID PRN Rise Patience, MD   0.25 mg at 05/21/21 1220   ascorbic acid (VITAMIN C) tablet 500 mg  500 mg Oral Daily Rise Patience, MD   500 mg at 05/21/21 5056   aspirin suppository 300 mg  300 mg Rectal Daily Rise Patience, MD       Or   aspirin tablet 325 mg  325 mg Oral Daily Rise Patience, MD   325 mg at 05/21/21 0919   atorvastatin (LIPITOR) tablet 40 mg  40 mg Oral QPM Rise Patience, MD   40 mg at 05/20/21 1732   azithromycin (ZITHROMAX) 500 mg in sodium chloride 0.9 % 250 mL IVPB  500 mg Intravenous Q24H Rise Patience, MD 250 mL/hr at 05/20/21 1846 500 mg at 05/20/21 1846   calcium-vitamin D (OSCAL WITH D) 500-5 MG-MCG per tablet 1 tablet  1 tablet Oral BID Rise Patience, MD   1 tablet at 05/21/21 0919   cefTRIAXone (ROCEPHIN) 2 g in sodium chloride 0.9 % 100 mL IVPB  2 g Intravenous Q24H Rise Patience, MD 200 mL/hr at 05/20/21 1739 2 g at 05/20/21 1739   enoxaparin (LOVENOX) injection 40 mg  40 mg Subcutaneous Q24H Rise Patience, MD   40 mg at 05/20/21 2136   feeding supplement (ENSURE ENLIVE / ENSURE PLUS) liquid 237 mL  237 mL Oral TID Rise Patience, MD   237 mL at 05/21/21 0919   lubiprostone (AMITIZA) capsule 24 mcg  24 mcg Oral BID WC Rise Patience, MD   24 mcg at 05/21/21 1109   multivitamin with minerals tablet 1 tablet  1 tablet Oral Daily Rise Patience, MD   1 tablet at 05/21/21 0919   polyvinyl alcohol (LIQUIFILM TEARS) 1.4 % ophthalmic solution 1 drop  1 drop Both Eyes PRN Benita Gutter, RPH       sertraline (ZOLOFT) tablet 100 mg  100 mg Oral Daily Rise Patience, MD   100 mg at 05/21/21 0919   trimethoprim (TRIMPEX) tablet 100 mg  100 mg Oral Daily Rise Patience, MD   100 mg at 05/21/21 9794     Discharge Medications: Please see discharge summary for a list of discharge medications.  Relevant Imaging Results:  Relevant Lab  Results:   Additional Information [phone]  Conception Oms, RN

## 2021-05-21 NOTE — Discharge Summary (Signed)
Physician Discharge Summary  [name] NWG:[id number] DOB: 03-24-1941 DOA: 05/20/2021  PCP: Kevin Sizer, MD  Admit date: 05/20/2021 Discharge date: 05/21/2021  Admitted From: group home Disposition:  group home  Recommendations for Outpatient Follow-up:  Follow up with PCP in 1-2 weeks, follow-up with orthopedics in 1 week with urology in 1 week Please obtain BMP/CBC in one week Please follow up on the following pending results:  Home Health:no  Equipment/Devices: none  Discharge Condition: Stable Code Status:   Code Status: Full Code Diet recommendation:  Diet Order             Diet Heart Room service appropriate? Yes; Fluid consistency: Thin  Diet effective now                    Brief/Interim Summary: [name], 81 y.o. male with PMH of  CP largely bedbound with contractures of his lower extremity with history of prostate cancer s/p brachytherapy in 2010 being followed by urologist recently placed on trimethoprim for left-sided epididymitis since last week of December 2022 was found to be hypotensive at home and was brought to the ER. He was hypotensive 1/26 at the hematologist office and as per the report was given fluids and discharged home. No reported nausea vomiting diarrhea chest pain or shortness of breath. In ED:: Sbp -80S . S/P 1 L ns and BP improved.  Work-up showed left upper extremity was weak with mild pronator drift and per family going on for 48 hours.Patient usually does not have good function of his lower extremities.CT head was unremarkable.  Labs are largely at baseline with COVID test negative.  Chest x-ray does show possibility of pneumonia, UA was abnormal WBC 21-50, moderate leukocytes, urine culture sent. Patient was admitted with IV antibiotics, MRI brain was ordered neurology was consulted This morning on exam he is alert awake able to feed, is able to move his upper extremity. MRI could not be done due to anxiety and planning for 1  today after Xanax MRI brain was completed did not show any acute finding.  X-ray showed possible rotator cuff issue with a high riding humerus head-for which he needs outpatient orthopedic follow-up as per [name].Patient is from group home with a hospital bed where lift and wheelchair at the facility. Discussion needs for plan of care for return back to the facility  Discharge Diagnoses:   Left upper extremity weakness: no stroke on MRI, question subluxation-x-ray lt shoulder-x-ray done that shows high riding humerus possible rotator cuff issue I discussed with [name] no acute inpatient intervention needed will need follow-up with orthopedics, communicated with patient's niece  Hypotension: Likely multifactorial.  Possibly prerenal, also due to infection. Bp stable mow.  Encourage oral intake.  Complex lesion on left scrotum:Recent sonogram of the scrotum did show complex lesion in the left scrotum.  Patient is on antibiotics for left epididymitis placed by patient's urologis [name] discussed with urologist [name] no further work-up at this time since the sonogram shows features concerning for complex hydrocele. Patient will follow up with urology outpatient he was placed on 90 days of antibiotics which he will resume Focal hazy opacity in the left lower lung zone likely atelectasis less likely pneumonia procalcitonin negative and no leukocytosis or fever.  Patient already on antibiotics for scrotal lesion which will continue UTI POA: On antibiotics-already he will continue his current antibiotics  History of prostate cancer  Cerebral palsy largely bedbound and contractures LE: Supportive care  Anemia of chronic disease  HLD on Lipitor.  Pressure injury of sacrum stage II POA of right sacrum stage I POA and deep tissue injury left heel POA. Cont wound care offloading supportive care at the group home  Consults: Ortho, urology over phone   Subjective: Alert  awake oriented.  Was able to feed with handle assistance Able to squeeze both upper extremities very well  Discharge Exam: Vitals:   05/21/21 0903 05/21/21 1150  BP: 130/73 113/70  Pulse: 62 62  Resp: 17 19  Temp: 98.2 F (36.8 C) 97.8 F (36.6 C)  SpO2: 98% 98%   General: Pt is alert, awake, not in acute distress Cardiovascular: RRR, S1/S2 +, no rubs, no gallops Respiratory: CTA bilaterally, no wheezing, no rhonchi Abdominal: Soft, NT, ND, bowel sounds + Extremities: no edema, no cyanosis  Discharge Instructions  Discharge Instructions     Discharge instructions   Complete by: As directed    Follow-up with urology as well as orthopedic surgery as outpatient PCP in a week, check CBC BMP in a week.  Please call call MD or return to ER for similar or worsening recurring problem that brought you to hospital or if any fever,nausea/vomiting,abdominal pain, uncontrolled pain, chest pain,  shortness of breath or any other alarming symptoms.  Please follow-up your doctor as instructed in a week time and call the office for appointment.  Please avoid alcohol, smoking, or any other illicit substance and maintain healthy habits including taking your regular medications as prescribed.  You were cared for by a hospitalist during your hospital stay. If you have any questions about your discharge medications or the care you received while you were in the hospital after you are discharged, you can call the unit and ask to speak with the hospitalist on call if the hospitalist that took care of you is not available.  Once you are discharged, your primary care physician will handle any further medical issues. Please note that NO REFILLS for any discharge medications will be authorized once you are discharged, as it is imperative that you return to your primary care physician (or establish a relationship with a primary care physician if you do not have one) for your aftercare needs so that they can  reassess your need for medications and monitor your lab values   Discharge wound care:   Complete by: As directed    Pressure Injury 05/20/21 Heel Left Deep Tissue Pressure Injury - Purple or maroon localized area of discolored intact skin or blood-filled blister due to damage of underlying soft tissue from pressure and/or shear. <1 day   Pressure Injury 05/20/21 Sacrum Right Stage 1 -  Intact skin with non-blanchable redness of a localized area usually over a bony prominence. red and non    Continue offloading support wound care at the facility   Increase activity slowly   Complete by: As directed       Allergies as of 05/21/2021   No Known Allergies      Medication List     TAKE these medications    acetaminophen 325 MG tablet Commonly known as: TYLENOL TAKE 2 TABLETS (650 MG TOTAL) BY MOUTH EVERY 6 HOURS AS NEEDED FOR MILD PAIN OR HEADACHE.   alfuzosin 10 MG 24 hr tablet Commonly known as: UROXATRAL TAKE 1 TABLET BY MOUTH DAILY   ALPRAZolam 0.25 MG tablet Commonly known as: XANAX TAKE ONE TABLET BY MOUTH 2 TIMES A DAY AS NEEDED FOR ANXIETY (WHEN BLOOD PRESSURE IS VERY  HIGH OR ANXIOUS)   Artificial Tears 0.2-0.2-1 % Soln Generic drug: Glycerin-Hypromellose-PEG 400 PLACE 2 DROPS INTO EYE 2 TIMES A DAY AS NEEDED   atorvastatin 40 MG tablet Commonly known as: LIPITOR TAKE 1 TABLET BY MOUTH EVERY EVENING   augmented betamethasone dipropionate 0.05 % cream Commonly known as: DIPROLENE-AF APPLY TOPICALLY AS DIRECTED TO RASH AS NEEDED   Calcium Carb-Cholecalciferol 500-10 MG-MCG Tabs TAKE 1 TABLET BY MOUTH 2 TIMES A DAY   Cranberry Juice Powder 425 MG Caps TAKE 1 CAPSULE BY MOUTH ONCE DAILY   Depend Silhouette Briefs L/XL Misc 1 each by Does not apply route 2 (two) times daily as needed.   Ensure Original Liqd Take 1 Can by mouth 3 (three) times daily.   Fusion Plus Caps TAKE 1 CAPSULE BY MOUTH DAILY   GNP Vitamin C 500 MG tablet Generic drug: ascorbic  acid TAKE ONE TABLET BY MOUTH ONCE DAILY   hydroxypropyl methylcellulose / hypromellose 2.5 % ophthalmic solution Commonly known as: ISOPTO TEARS / GONIOVISC Place 1 drop into both eyes 3 (three) times daily as needed for dry eyes.   levofloxacin 500 MG tablet Commonly known as: LEVAQUIN Take 1 tablet (500 mg total) by mouth daily.   lubiprostone 24 MCG capsule Commonly known as: AMITIZA Take 1 capsule (24 mcg total) by mouth 2 (two) times daily with a meal.   Medical Compression Stockings Misc 1 Units by Does not apply route daily.   nystatin cream Commonly known as: MYCOSTATIN Apply 1 application topically 2 (two) times daily.   sertraline 100 MG tablet Commonly known as: ZOLOFT TAKE 1 TABLET BY MOUTH DAILY   sulfamethoxazole-trimethoprim 800-160 MG tablet Commonly known as: BACTRIM DS Take 1 tablet by mouth every 12 (twelve) hours.   trimethoprim 100 MG tablet Commonly known as: TRIMPEX Take 1 tablet (100 mg total) by mouth daily.               Discharge Care Instructions  (From admission, onward)           Start     Ordered   05/21/21 0000  Discharge wound care:       Comments: Pressure Injury 05/20/21 Heel Left Deep Tissue Pressure Injury - Purple or maroon localized area of discolored intact skin or blood-filled blister due to damage of underlying soft tissue from pressure and/or shear. <1 day   Pressure Injury 05/20/21 Sacrum Right Stage 1 -  Intact skin with non-blanchable redness of a localized area usually over a bony prominence. red and non    Continue offloading support wound care at the facility   05/21/21 1402            Follow-up Information     Creig Hines, MD Follow up in 1 week(s).   Specialty: Orthopedic Surgery Contact information: [place] [address] [phone]         Kevin Sizer, MD Follow up in 1 week(s).   Specialty: Family Medicine Contact information: [address] [place] [address] [phone]                No Known Allergies  The results of significant diagnostics from this hospitalization (including imaging, microbiology, ancillary and laboratory) are listed below for reference.    Microbiology: Recent Results (from the past 240 hour(s))  Resp Panel by RT-PCR (Flu A&B, Covid) Nasopharyngeal Swab     Status: None   Collection Time: 05/20/21  1:00 PM   Specimen: Nasopharyngeal Swab;  Nasopharyngeal(NP) swabs in vial transport medium  Result Value Ref Range Status   SARS Coronavirus 2 by RT PCR NEGATIVE NEGATIVE Final    Comment: (NOTE) SARS-CoV-2 target nucleic acids are NOT DETECTED.  The SARS-CoV-2 RNA is generally detectable in upper respiratory specimens during the acute phase of infection. The lowest concentration of SARS-CoV-2 viral copies this assay can detect is 138 copies/mL. A negative result does not preclude SARS-Cov-2 infection and should not be used as the sole basis for treatment or other patient management decisions. A negative result may occur with  improper specimen collection/handling, submission of specimen other than nasopharyngeal swab, presence of viral mutation(s) within the areas targeted by this assay, and inadequate number of viral copies(<138 copies/mL). A negative result must be combined with clinical observations, patient history, and epidemiological information. The expected result is Negative.  Fact Sheet for Patients:  EntrepreneurPulse.com.au  Fact Sheet for Healthcare Providers:  IncredibleEmployment.be  This test is no t yet approved or cleared by the [place] FDA and  has been authorized for detection and/or diagnosis of SARS-CoV-2 by FDA under an Emergency Use Authorization (EUA). This EUA will remain  in effect (meaning this test can be used) for the duration of the COVID-19 declaration under Section 564(b)(1) of the Act, 21 U.S.C.section  360bbb-3(b)(1), unless the authorization is terminated  or revoked sooner.       Influenza A by PCR NEGATIVE NEGATIVE Final   Influenza B by PCR NEGATIVE NEGATIVE Final    Comment: (NOTE) The Xpert Xpress SARS-CoV-2/FLU/RSV plus assay is intended as an aid in the diagnosis of influenza from Nasopharyngeal swab specimens and should not be used as a sole basis for treatment. Nasal washings and aspirates are unacceptable for Xpert Xpress SARS-CoV-2/FLU/RSV testing.  Fact Sheet for Patients: EntrepreneurPulse.com.au  Fact Sheet for Healthcare Providers: IncredibleEmployment.be  This test is not yet approved or cleared by the [place] FDA and has been authorized for detection and/or diagnosis of SARS-CoV-2 by FDA under an Emergency Use Authorization (EUA). This EUA will remain in effect (meaning this test can be used) for the duration of the COVID-19 declaration under Section 564(b)(1) of the Act, 21 U.S.C. section 360bbb-3(b)(1), unless the authorization is terminated or revoked.  Performed at [facility], [address] [place], [place] [address]   Urine Culture     Status: Abnormal (Preliminary result)   Collection Time: 05/20/21  3:45 PM   Specimen: Urine, Random  Result Value Ref Range Status   Specimen Description   Final    URINE, RANDOM Performed at [facility], [address], [place], [place] [address]    Special Requests   Final    NONE Performed at [facility], [address], [place], [place] [address]    Culture (A)  Final    >=100,000 COLONIES/mL ENTEROCOCCUS FAECALIS SUSCEPTIBILITIES TO FOLLOW Performed at [facility] Center [address] [place],  [address]    Report Status PENDING  Incomplete    Procedures/Studies: DG Knee 1-2 Views Left  Result Date: 05/20/2021 CLINICAL DATA:  Left knee pain EXAM: LEFT KNEE - 1-2 VIEW COMPARISON:  None. FINDINGS: No acute fracture or  dislocation identified. Bones are osteopenic. Mild tricompartmental joint space narrowing with small marginal osteophytes. No large joint effusion identified, limited evaluation due to positioning. IMPRESSION: No acute osseous abnormality identified.  Somewhat limited study. Electronically Signed   By: Ofilia Neas M.D.   On: 05/20/2021 16:09   DG Knee 1-2 Views  Right  Result Date: 05/20/2021 CLINICAL DATA:  Knee pain EXAM: RIGHT KNEE - 1-2 VIEW COMPARISON:  None. FINDINGS: No acute fracture or dislocation identified. Bones are osteopenic. Mild tricompartmental joint space narrowing with small marginal osteophytes. No significant joint effusion visualized. IMPRESSION: No acute osseous abnormality identified. Electronically Signed   By: Ofilia Neas M.D.   On: 05/20/2021 16:08   CT Head Wo Contrast  Result Date: 05/20/2021 CLINICAL DATA:  Mental status change, unknown cause EXAM: CT HEAD WITHOUT CONTRAST TECHNIQUE: Contiguous axial images were obtained from the base of the skull through the vertex without intravenous contrast. RADIATION DOSE REDUCTION: This exam was performed according to the departmental dose-optimization program which includes automated exposure control, adjustment of the mA and/or kV according to patient size and/or use of iterative reconstruction technique. COMPARISON:  05/07/2020 FINDINGS: Brain: There is no acute intracranial hemorrhage, mass effect, or edema. Gray-white differentiation is preserved. There is no extra-axial fluid collection. Ventricles and sulci stable in size and configuration. Patchy hypoattenuation in the supratentorial white matter probably reflects stable chronic microvascular ischemic changes. Vascular: There is atherosclerotic calcification at the skull base. Skull: Calvarium is unremarkable. Sinuses/Orbits: Mild paranasal sinus mucosal thickening. No acute orbital abnormality. Other: None. IMPRESSION: No acute intracranial abnormality or significant  change since recent prior study. Electronically Signed   By: Macy Mis M.D.   On: 05/20/2021 13:12   MR BRAIN WO CONTRAST  Result Date: 05/21/2021 CLINICAL DATA:  History of cerebral palsy, stroke, concern for fever and hypotension. EXAM: MRI HEAD WITHOUT CONTRAST TECHNIQUE: Multiplanar, multiecho pulse sequences of the brain and surrounding structures were obtained without intravenous contrast. COMPARISON:  CT head dated 1 day prior. FINDINGS: Image quality is intermittently motion degraded. Specifically, the sagittal T1 images are nondiagnostic, and the axial FLAIR FLAIR, SWI, and T1 sequences are markedly motion degraded. Brain: There is no evidence of acute intracranial hemorrhage, extra-axial fluid collection, or acute infarct. There is mild global parenchymal volume loss with prominence of the ventricular system and extra-axial CSF spaces. Patchy FLAIR signal abnormality in the subcortical and periventricular white matter is nonspecific but likely reflects sequela of mild chronic white matter microangiopathy. There is no suspicious parenchymal signal abnormality There is no definite mass lesion.  There is no midline shift. Vascular: Normal flow voids. Skull and upper cervical spine: There is no definite marrow signal abnormality. Sinuses/Orbits: There is mucosal thickening in the right maxillary sinus. The globes and orbits are grossly unremarkable though suboptimally assessed. Other: A diffusion restricting lesion in the left parotid gland is favored to reflect a lymph node, also present on the prior CT face from 05/27/2019. IMPRESSION: 1. No evidence of acute intracranial pathology, within the confines of motion degraded images as above. 2. Mild global parenchymal volume loss and chronic white matter microangiopathy. Electronically Signed   By: Valetta Mole M.D.   On: 05/21/2021 13:11   DG Chest Portable 1 View  Result Date: 05/20/2021 CLINICAL DATA:  Fatigue EXAM: PORTABLE CHEST 1 VIEW  COMPARISON:  Chest x-ray 05/07/2020 FINDINGS: Heart size is normal. Mediastinum appears stable. Tortuous and likely ectatic thoracic aorta. Focal hazy opacity in the left lower lung zone. No pleural effusion or pneumothorax identified. Degenerative changes of the shoulders. IMPRESSION: Focal hazy opacity in the left lower lung zone new since previous study which could represent pneumonia. Electronically Signed   By: Ofilia Neas M.D.   On: 05/20/2021 13:39   DG Shoulder Left  Result Date: 05/21/2021 CLINICAL DATA:  Left shoulder  weakness EXAM: LEFT SHOULDER - 2+ VIEW COMPARISON:  None. FINDINGS: Diffuse bony demineralization. No evidence of acute fracture. Humeral head is high-riding without evidence of dislocation. Mild degenerative changes of the glenohumeral and acromioclavicular joints. No evidence of a focal bone lesion. No soft tissue swelling. IMPRESSION: 1. No evidence of acute fracture or dislocation. 2. High-riding left humeral head suggestive of rotator cuff pathology. 3. Mild degenerative changes of the left shoulder. Electronically Signed   By: Davina Poke D.O.   On: 05/21/2021 13:37   US SCROTUM W/DOPPLER  Result Date: 05/18/2021 CLINICAL DATA:  Left epididymitis. Left testicular mass/left orchitis. 81 year old with urinary tract infection 3 weeks ago. Scrotal swelling started 5 days ago. EXAM: SCROTAL ULTRASOUND DOPPLER ULTRASOUND OF THE TESTICLES TECHNIQUE: Complete ultrasound examination of the testicles, epididymis, and other scrotal structures was performed. Color and spectral Doppler ultrasound were also utilized to evaluate blood flow to the testicles. COMPARISON:  No prior scrotal ultrasound. Abdominopelvic CT 03/28/2021 FINDINGS: Right testicle Measurements: 3.9 x 2.2 x 3.2 cm. The testis is located in the inguinal canal. Mildly heterogeneous. No discrete mass or microlithiasis visualized. Normal testicular blood flow. Left testicle Measurements: 4.3 x 2.8 x 3.5 cm. 7 mm  intra testicular cyst. No solid lesion. Parenchymal echotexture is mildly heterogeneous. No microlithiasis. Normal blood flow. Right epididymis: 6 mm epididymal head cyst. Normal in size and appearance. Left epididymis: Not discretely visualized. Within the left hemiscrotum superior to the testis is a complex heterogeneous primarily hypoechoic lesion measuring 4.9 x 2.6 x 4.4 cm. In addition to hypoechoic areas there also cystic spaces, some of which contain mobile debris. No internal vascularity. Hydrocele:  None visualized. Varicocele:  None visualized. Pulsed Doppler interrogation of both testes demonstrates normal low resistance arterial and venous waveforms bilaterally. Other: There is scrotal skin thickening at 8-9 mm. Technically challenging exam due to patient's medical condition, contracted and unable to straighten lower extremity. IMPRESSION: 1. Subcentimeter left intratesticular cyst. No solid intratesticular lesion. 2. Complex lesion in the left hemiscrotum that is extratesticular. This is avascular, heterogeneously hypoechoic with cystic spaces which contains mobile debris. It is unclear if this represents a complex fluid collection in the hemiscrotum versus in avascular/partially cystic mass. The left epididymis is not discretely identified. Review of prior CT demonstrates soft tissue density in the left inguinal canal, however no discrete hernia on the current exam or on prior CT. 3. Bilateral scrotal skin thickening. 4. Right testis located in the inguinal canal. Electronically Signed   By: Keith Rake M.D.   On: 05/18/2021 15:10    Labs: BNP (last 3 results) No results for input(s): BNP in the last 8760 hours. Basic Metabolic Panel: Recent Labs  Lab 05/20/21 1126 05/20/21 1708 05/21/21 0554  NA 136  --  140  K 4.4  --  3.8  CL 105  --  109  CO2 24  --  23  GLUCOSE 98  --  84  BUN 15  --  12  CREATININE 0.55* 0.56* 0.52*  CALCIUM 8.7*  --  8.7*   Liver Function Tests: Recent  Labs  Lab 05/21/21 0554  AST 13*  ALT 15  ALKPHOS 84  BILITOT 0.3  PROT 6.1*  ALBUMIN 2.6*   No results for input(s): LIPASE, AMYLASE in the last 168 hours. No results for input(s): AMMONIA in the last 168 hours. CBC: Recent Labs  Lab 05/19/21 0952 05/20/21 1126 05/20/21 1708 05/21/21 0554  WBC 7.4 8.6 8.6 6.7  NEUTROABS 5.9  --   --   --  HGB 10.9* 10.8* 11.1* 9.8*  HCT 32.1* 32.3* 33.2* 29.2*  MCV 96.7 97.9 95.4 95.1  PLT 198 218 212 191   Cardiac Enzymes: No results for input(s): CKTOTAL, CKMB, CKMBINDEX, TROPONINI in the last 168 hours. BNP: Invalid input(s): POCBNP CBG: No results for input(s): GLUCAP in the last 168 hours. D-Dimer No results for input(s): DDIMER in the last 72 hours. Hgb A1c No results for input(s): HGBA1C in the last 72 hours. Lipid Profile No results for input(s): CHOL, HDL, LDLCALC, TRIG, CHOLHDL, LDLDIRECT in the last 72 hours. Thyroid function studies Recent Labs    05/20/21 1708  TSH 2.750   Anemia work up Recent Labs    05/19/21 0952  VITAMINB12 998*  FERRITIN 135  TIBC 237*  IRON 35*   Urinalysis    Component Value Date/Time   COLORURINE STRAW (A) 05/20/2021 1544   APPEARANCEUR CLEAR 05/20/2021 1544   APPEARANCEUR Clear 05/04/2021 1301   LABSPEC <1.005 (L) 05/20/2021 1544   LABSPEC 1.012 05/25/2013 1506   PHURINE 6.5 05/20/2021 1544   GLUCOSEU NEGATIVE 05/20/2021 1544   GLUCOSEU Negative 05/25/2013 1506   HGBUR LARGE (A) 05/20/2021 1544   BILIRUBINUR NEGATIVE 05/20/2021 1544   BILIRUBINUR Negative 05/04/2021 1301   BILIRUBINUR Negative 05/25/2013 1506   KETONESUR NEGATIVE 05/20/2021 1544   PROTEINUR NEGATIVE 05/20/2021 1544   UROBILINOGEN 0.2 10/03/2018 1032   NITRITE NEGATIVE 05/20/2021 1544   LEUKOCYTESUR MODERATE (A) 05/20/2021 1544   LEUKOCYTESUR Negative 05/25/2013 1506   Sepsis Labs Invalid input(s): PROCALCITONIN,  WBC,  LACTICIDVEN Microbiology Recent Results (from the past 240 hour(s))  Resp Panel  by RT-PCR (Flu A&B, Covid) Nasopharyngeal Swab     Status: None   Collection Time: 05/20/21  1:00 PM   Specimen: Nasopharyngeal Swab; Nasopharyngeal(NP) swabs in vial transport medium  Result Value Ref Range Status   SARS Coronavirus 2 by RT PCR NEGATIVE NEGATIVE Final    Comment: (NOTE) SARS-CoV-2 target nucleic acids are NOT DETECTED.  The SARS-CoV-2 RNA is generally detectable in upper respiratory specimens during the acute phase of infection. The lowest concentration of SARS-CoV-2 viral copies this assay can detect is 138 copies/mL. A negative result does not preclude SARS-Cov-2 infection and should not be used as the sole basis for treatment or other patient management decisions. A negative result may occur with  improper specimen collection/handling, submission of specimen other than nasopharyngeal swab, presence of viral mutation(s) within the areas targeted by this assay, and inadequate number of viral copies(<138 copies/mL). A negative result must be combined with clinical observations, patient history, and epidemiological information. The expected result is Negative.  Fact Sheet for Patients:  EntrepreneurPulse.com.au  Fact Sheet for Healthcare Providers:  [employer]  This test is no t yet approved or cleared by the [place] FDA and  has been authorized for detection and/or diagnosis of SARS-CoV-2 by FDA under an Emergency Use Authorization (EUA). This EUA will remain  in effect (meaning this test can be used) for the duration of the COVID-19 declaration under Section 564(b)(1) of the Act, 21 U.S.C.section 360bbb-3(b)(1), unless the authorization is terminated  or revoked sooner.       Influenza A by PCR NEGATIVE NEGATIVE Final   Influenza B by PCR NEGATIVE NEGATIVE Final    Comment: (NOTE) The Xpert Xpress SARS-CoV-2/FLU/RSV plus assay is intended as an aid in the diagnosis of influenza from Nasopharyngeal swab  specimens and should not be used as a sole basis for treatment. Nasal washings and aspirates are unacceptable for Xpert  Xpress SARS-CoV-2/FLU/RSV testing.  Fact Sheet for Patients: EntrepreneurPulse.com.au  Fact Sheet for Healthcare Providers: [employer]  This test is not yet approved or cleared by the [place] FDA and has been authorized for detection and/or diagnosis of SARS-CoV-2 by FDA under an Emergency Use Authorization (EUA). This EUA will remain in effect (meaning this test can be used) for the duration of the COVID-19 declaration under Section 564(b)(1) of the Act, 21 U.S.C. section 360bbb-3(b)(1), unless the authorization is terminated or revoked.  Performed at [facility], [address] [place], [place] [address]   Urine Culture     Status: Abnormal (Preliminary result)   Collection Time: 05/20/21  3:45 PM   Specimen: Urine, Random  Result Value Ref Range Status   Specimen Description   Final    URINE, RANDOM Performed at [facility], [address] [place], [place] [address]    Special Requests   Final    NONE Performed at [facility], [address] [place], [place] [address]    Culture (A)  Final    >=100,000 COLONIES/mL ENTEROCOCCUS FAECALIS SUSCEPTIBILITIES TO FOLLOW Performed at [facility], [address], [place], [place] [address]    Report Status PENDING  Incomplete     Time coordinating discharge: 25 minutes  SIGNED: Antonieta Pert, MD  [facility] 05/21/2021, 2:04 PM  If 7PM-7AM, please contact night-coverage [email or web address]

## 2021-05-21 NOTE — Evaluation (Signed)
Occupational Therapy Evaluation Patient Details Name: [name] MRN: [id number] DOB: 03/04/1941 Today's Date: 05/21/2021   History of Present Illness Pt is a 81 y/o M with PMH: CP, ID, prostate Cancer, HLD, and BPH, who presented to ED via [facility] from [facility] medical clinic for evaluation of possible sepsis, hypotensive x2 days. MD assessment included noting weakness in L UE with pronator drift.   Clinical Impression   Pt seen for OT evaluation this date in setting of acute hospitalization for possible sepsis, subsequently found to have L UE weakness, s/p XR with no dislocation noted, but possible rotator cuff issue and plan for f/u imaging. Pt presents with no c/o pain, but is significantly limited with L UE ROM as it pertains to ADLs such as self-feeding. Pt reports feeding himself at baseline, but unsure of efficacy of this information. Unsuccessful in reaching his Group home via telephone today. Will continue to follow acutely and work towards improved INDEP with self-feeding and improved ROM as tolerable/safe with consideration to ongoing workup.      Recommendations for follow up therapy are one component of a multi-disciplinary discharge planning process, led by the attending physician.  Recommendations may be updated based on patient status, additional functional criteria and insurance authorization.   Follow Up Recommendations  Follow physician's recommendations for discharge plan and follow up therapies    Assistance Recommended at Discharge Frequent or constant Supervision/Assistance  Patient can return home with the following      Functional Status Assessment  Patient has not had a recent decline in their functional status  Equipment Recommendations  None recommended by OT    Recommendations for Other Services       Precautions / Restrictions Precautions Precautions: Fall Restrictions Weight Bearing Restrictions: No      Mobility Bed Mobility Overal bed  mobility: Needs Assistance Bed Mobility: Rolling Rolling: Total assist         General bed mobility comments: based on clinical observation of strength/ROM    Transfers                          Balance                                           ADL either performed or assessed with clinical judgement   ADL                                         General ADL Comments: MAX A For self-feeding, TOTAL A for bed level LB ADLs including toileting, based on clinical observation     Vision   Additional Comments: dificult to formally assess, but pt appears to track appropriately.     Perception     Praxis      Pertinent Vitals/Pain Pain Assessment Pain Assessment: No/denies pain (asked if pt as has pain, especially with attempts to move L UE, but he consistently says no, nor does he display any outward indications of pain)     Hand Dominance     Extremity/Trunk Assessment Upper Extremity Assessment Upper Extremity Assessment: Generalized weakness;LUE deficits/detail (notable limitations of digits, esp noted that thumb essentiall rests in abduction bilaterally and digits 2-5 in flexion, requiring incrased time to reposition for a functional task  like holding a cup) LUE Deficits / Details: concerns regarding L shoulder ROM/potential displacement. Noted to appear large and poorly aligning with shoulder socket/glenoid fossa.   Lower Extremity Assessment Lower Extremity Assessment: Generalized weakness       Communication Communication Communication: Other (comment) (somewhat garbled communication which is baseline for pt)   Cognition Arousal/Alertness: Awake/alert Behavior During Therapy: WFL for tasks assessed/performed Overall Cognitive Status: History of cognitive impairments - at baseline                                 General Comments: pleasant and cooperative, he seems to answer most questions appropriately,  but difficult to full discern 2/2 garbled speech.     General Comments       Exercises     Shoulder Instructions      Home Living Family/patient expects to be discharged to:: Group home                                        Prior Functioning/Environment               Mobility Comments: hoyer at baseline ADLs Comments: unable to reach facility via telephone, unsure if pt is able to self-feed, reasonable to assume that since chart indicates he was requiring hoyer lift for transfers, he waws likely requiring significant help for ADLs.        OT Problem List: Decreased range of motion      OT Treatment/Interventions: Self-care/ADL training;Therapeutic activities    OT Goals(Current goals can be found in the care plan section) Acute Rehab OT Goals Patient Stated Goal: none stated OT Goal Formulation: Patient unable to participate in goal setting Time For Goal Achievement: 06/04/21 Potential to Achieve Goals: Fair ADL Goals Pt Will Perform Eating: with set-up;with min assist;sitting (bed level, high-fowler's versus supported sitting in recliner, w/ built up untensils as needed.) Pt/caregiver will Perform Home Exercise Program: Increased ROM;Left upper extremity (mindful of potential displacement/rotator cuff issue and further imaging to be taken)  OT Frequency: Min 1X/week    Co-evaluation              AM-PAC OT "6 Clicks" Daily Activity     Outcome Measure Help from another person eating meals?: A Lot Help from another person taking care of personal grooming?: A Lot Help from another person toileting, which includes using toliet, bedpan, or urinal?: Total Help from another person bathing (including washing, rinsing, drying)?: Total Help from another person to put on and taking off regular upper body clothing?: Total Help from another person to put on and taking off regular lower body clothing?: Total 6 Click Score: 8   End of Session Nurse  Communication: Other (comment) (concerns regarding L shoulder displacement)  Activity Tolerance: Patient tolerated treatment well Patient left: in bed;with call bell/phone within reach;with bed alarm set                   Time: 7902-4097 OT Time Calculation (min): 14 min Charges:  OT General Charges $OT Visit: 1 Visit OT Evaluation $OT Eval Low Complexity: 1 Low  Gerrianne Scale, [place], OTR/L ascom [phone] 05/21/21, 2:55 PM

## 2021-05-21 NOTE — Progress Notes (Signed)
Chart reviewed, attempted to visit. Pt out of the room for testing. Reattempt eval as able. Rec bedside swallow eval secondary to CP and possible pneumonia.

## 2021-05-21 NOTE — Progress Notes (Signed)
Called and spoke with [facility] [phone]. Discharge instructions and discharge summary will be sent with patient along with needed transport forms. Jasmine stated the nurse was "out of town" and available to give nurse to nurse report. Informed Jasmine the pt will be transported back via EMS.

## 2021-05-21 NOTE — TOC Progression Note (Signed)
Transition of Care [facility]) - Progression Note    Patient Details  Name: [name] MRN: [id number] Date of Birth: 1940/06/26  Transition of Care [facility]) CM/SW [place], RN Phone Number: 05/21/2021, 10:48 AM  Clinical Narrative:   Spoke with the patient's niece who is his legal guardian, [name] [phone] The patient resides at what used to be [name] and is now [place] [facility], He has a Hospital bed, A hoyer lift, a Wheelchair, they transport him in a wheelchair Lucianne Lei He does not have additional needs per the Niece, he is well taken care of there at the group home         Expected Discharge Plan and Services                                                 Social Determinants of Health (SDOH) Interventions    Readmission Risk Interventions No flowsheet data found.

## 2021-05-21 NOTE — TOC Progression Note (Addendum)
Transition of Care [facility]) - Progression Note    Patient Details  Name: [name] MRN: [id number] Date of Birth: [birth year]  Transition of Care [facility]) CM/SW [place], RN Phone Number: 05/21/2021, 2:13 PM  Clinical Narrative:   Damaris Schooner to Wells Guiles the patient's niece and Legal Guardian, He will need EMS transport, the group home does not have a van diver on the weekend, The bedside nurse to call the Group home and speak with [facility] the Nurse and give report , I gave the bedside nurse the phone number to call, EMS will be called after the bedside nurse is able to call and speak with [facility]         Expected Discharge Plan and Services           Expected Discharge Date: 05/21/21                                     Social Determinants of Health (SDOH) Interventions    Readmission Risk Interventions No flowsheet data found.

## 2021-05-22 LAB — URINE CULTURE: Culture: >=100000 — AB

## 2021-05-23 ENCOUNTER — Telehealth: Payer: Self-pay | Admitting: *Deleted

## 2021-05-23 ENCOUNTER — Other Ambulatory Visit: Payer: Self-pay | Admitting: Gastroenterology

## 2021-05-23 ENCOUNTER — Telehealth: Payer: Self-pay

## 2021-05-23 MED ORDER — AMOXICILLIN-POT CLAVULANATE 875-125 MG PO TABS: 1.0000 | ORAL_TABLET | Freq: Two times a day (BID) | ORAL | 0 refills | Status: AC

## 2021-05-23 NOTE — Telephone Encounter (Signed)
Spoke with Yvone Neu ( caregiver) and advised results, pt will follow-up on Wednesday.

## 2021-05-23 NOTE — Telephone Encounter (Signed)
.  left message to have patient return my call.  

## 2021-05-23 NOTE — Telephone Encounter (Signed)
Pt is scheduled for Thursday with Dr Ancil Boozer on 05/26/2021

## 2021-05-23 NOTE — Telephone Encounter (Signed)
Transition Care Management Follow-up Telephone Call Date of discharge and from where: 05/21/21 [facility] How have you been since you were released from the hospital? Pt doing pretty good per caregiver [name] Any questions or concerns? No  Items Reviewed: Did the pt receive and understand the discharge instructions provided? Yes  Medications obtained and verified? Yes  Other? No  Any new allergies since your discharge? No  Dietary orders reviewed? Yes Do you have support at home? Yes   Home Care and Equipment/Supplies: Were home health services ordered? no  Were any new equipment or medical supplies ordered?  No   Functional Questionnaire: (I = Independent and D = Dependent) ADLs: D  Bathing/Dressing- D  Meal Prep- D  Eating- D  Maintaining continence- D  Transferring/Ambulation- D  Managing Meds- D  Follow up appointments reviewed:  PCP Hospital f/u appt confirmed? No  PCP schedule full; front desk to schedule appt. [facility] f/u appt confirmed? No  awaiting appt with orthopedic surgery . Are transportation arrangements needed? No  If their condition worsens, is the pt aware to call PCP or go to the Emergency Dept.? Yes Was the patient provided with contact information for the PCP's office or ED? Yes Was to pt encouraged to call back with questions or concerns? Yes

## 2021-05-23 NOTE — Telephone Encounter (Signed)
-----   Message from Nori Riis, PA-C sent at 05/23/2021  3:13 PM EST ----- Will you call Mr. Gossett caregivers and have them stop the trimethoprim and start Augmentin 875/125, twice daily for seven days  ----- Message ----- From: Steele Sizer, MD Sent: 05/23/2021   1:15 PM EST To: Nori Riis, PA-C  Urine culture done at [facility] but looks like your office has been giving him the antibiotics.  Thank you

## 2021-05-24 NOTE — Progress Notes (Signed)
05/25/2021 1:46 PM   [name] [birth year] [id number]  Referring provider: Steele Sizer, MD [address] [place],  [place] [address]  Chief Complaint  Patient presents with   Follow-up   Urological history 1.  History T1c intermediate risk prostate cancer -Gleason 3+4 diagnosed 2010 -Treated with brachytherapy 11/10/2008 -Last PSA 10/2015 <0.1   2.  Chronic bacteriuria -Cystoscopy Dr. Elnoria Howard 2015 unremarkable, nonocclusive prostate or stricture   3.  Stone disease - CT 2019 nonobstructing right mid ureteral calculus -Ureteroscopic stone removal 08/14/2016   4.  Renal cysts -CTU 2019 bilateral renal cysts with 2 indeterminate right renal lesions (18 mm interpolar left kidney and 15 mm anterior right kidney) -MRI 07/2017 showed the left renal lesion was not present and was a perfusion anomaly; right renal lesion a Bosniak 2 cyst -CTU 2022 - There is 1.9 cm smooth marginated low-density lesion in the midportion of left kidney. There is 2.2 cm cyst in the anterior midportion of right kidney. There is 2.3 cm exophytic lesion in the anterior midportion of right kidney, possibly cyst   HPI: [name] is a 81 y.o. male who presents today for recheck on left epididymitis, bilateral nephrolithiasis and balanitis with caregiver, Scientist, clinical (histocompatibility and immunogenetics).    Scrotal US 05/17/2021 - findings concerning for a complex hydrocele secondary to previous infection.    He was admitted for left upper extremity weakness 05/20/2021.    Today he is still having left testicular pain, but staff states that the testicle is less swollen than it was previously over the weekend.  They stated that it even had some drainage over the weekend, but they have not noticed any drainage recently.  Patient denies any modifying or aggravating factors.  Patient denies any gross hematuria, dysuria or suprapubic/flank pain.  Patient denies any fevers, chills, nausea or vomiting.     PMH: Past Medical History:   Diagnosis Date   Anxiety    Atrophy, disuse, muscle    BPH (benign prostatic hyperplasia)    Cerebral palsy (HCC)    Eczema    Elevated lipids    Elevated PSA    Exotropia    Hyperlipidemia    Kidney stone    Leukopenia    Lower extremity edema    lymphedema   Mental retardation    Over weight    Prostate cancer ([facility])    Venous insufficiency    Vitamin D deficiency    Wheelchair dependence     Surgical History: Past Surgical History:  Procedure Laterality Date   COLONOSCOPY WITH PROPOFOL N/A 05/10/2020   Procedure: COLONOSCOPY WITH PROPOFOL;  Surgeon: Lin Landsman, MD;  Location: [facility] ENDOSCOPY;  Service: Gastroenterology;  Laterality: N/A;   COLONOSCOPY WITH PROPOFOL N/A 05/12/2020   Procedure: COLONOSCOPY WITH PROPOFOL;  Surgeon: Lin Landsman, MD;  Location: [facility] ENDOSCOPY;  Service: Gastroenterology;  Laterality: N/A;   CYSTOSCOPY/URETEROSCOPY/HOLMIUM LASER/STENT PLACEMENT Right 08/14/2017   Procedure: CYSTOSCOPY/URETEROSCOPY/HOLMIUM LASER/STENT PLACEMENT;  Surgeon: Abbie Sons, MD;  Location: [facility] ORS;  Service: Urology;  Laterality: Right;   ESOPHAGOGASTRODUODENOSCOPY (EGD) WITH PROPOFOL N/A 05/12/2020   Procedure: ESOPHAGOGASTRODUODENOSCOPY (EGD) WITH PROPOFOL;  Surgeon: Lin Landsman, MD;  Location: [facility] ENDOSCOPY;  Service: Gastroenterology;  Laterality: N/A;   leg circulation surgery Right    PROSTATE SURGERY  11/10/2008   BRACHYTHERAPY    Home Medications:  Allergies as of 05/25/2021   No Known Allergies      Medication List        Accurate  as of May 25, 2021  1:46 PM. If you have any questions, ask your nurse or doctor.          acetaminophen 325 MG tablet Commonly known as: TYLENOL TAKE 2 TABLETS (650 MG TOTAL) BY MOUTH EVERY 6 HOURS AS NEEDED FOR MILD PAIN OR HEADACHE.   alfuzosin 10 MG 24 hr tablet Commonly known as: UROXATRAL TAKE 1 TABLET BY MOUTH DAILY   ALPRAZolam 0.25 MG tablet Commonly known as: XANAX TAKE  ONE TABLET BY MOUTH 2 TIMES A DAY AS NEEDED FOR ANXIETY (WHEN BLOOD PRESSURE IS VERY HIGH OR ANXIOUS)   amoxicillin-clavulanate 875-125 MG tablet Commonly known as: AUGMENTIN Take 1 tablet by mouth 2 (two) times daily for 7 days.   Artificial Tears 0.2-0.2-1 % Soln Generic drug: Glycerin-Hypromellose-PEG 400 PLACE 2 DROPS INTO EYE 2 TIMES A DAY AS NEEDED   atorvastatin 40 MG tablet Commonly known as: LIPITOR TAKE 1 TABLET BY MOUTH EVERY EVENING   augmented betamethasone dipropionate 0.05 % cream Commonly known as: DIPROLENE-AF APPLY TOPICALLY AS DIRECTED TO RASH AS NEEDED   Calcium Carb-Cholecalciferol 500-10 MG-MCG Tabs TAKE 1 TABLET BY MOUTH 2 TIMES A DAY   Cranberry Juice Powder 425 MG Caps TAKE 1 CAPSULE BY MOUTH ONCE DAILY   Depend Silhouette Briefs L/XL Misc 1 each by Does not apply route 2 (two) times daily as needed.   Ensure Original Liqd Take 1 Can by mouth 3 (three) times daily.   Fusion Plus Caps TAKE 1 CAPSULE BY MOUTH DAILY   GNP Vitamin C 500 MG tablet Generic drug: ascorbic acid TAKE ONE TABLET BY MOUTH ONCE DAILY   hydroxypropyl methylcellulose / hypromellose 2.5 % ophthalmic solution Commonly known as: ISOPTO TEARS / GONIOVISC Place 1 drop into both eyes 3 (three) times daily as needed for dry eyes.   lubiprostone 24 MCG capsule Commonly known as: AMITIZA Take 1 capsule (24 mcg total) by mouth 2 (two) times daily with a meal.   Medical Compression Stockings Misc 1 Units by Does not apply route daily.   nystatin cream Commonly known as: MYCOSTATIN Apply 1 application topically 2 (two) times daily.   sertraline 100 MG tablet Commonly known as: ZOLOFT TAKE 1 TABLET BY MOUTH DAILY        Allergies: No Known Allergies  Family History: Family History  Problem Relation Age of Onset   Heart attack Brother    Heart disease Other     Social History:  reports that he has never smoked. He has never used smokeless tobacco. He reports that he  does not drink alcohol and does not use drugs.  ROS: Pertinent ROS in HPI  Physical Exam: BP 100/63    Pulse 66    Ht 5\' 10"  (1.778 m)    Wt 130 lb (59 kg)    BMI 18.65 kg/m   Constitutional:  Well nourished. Alert and oriented, No acute distress. HEENT:  AT, moist mucus membranes.  Trachea midline Cardiovascular: No clubbing, cyanosis, or edema. Respiratory: Normal respiratory effort, no increased work of breathing. GU: No CVA tenderness.  No bladder fullness or masses.  Patient with uncircumcised phallus.  Foreskin easily retracted  Urethral meatus is patent.  No penile discharge. No penile lesions or rashes. Scrotum without lesions, cysts, rashes and/or edema.  Right testicle is located scrotally.  No masses are appreciated in the testicles.  Right epididymis are normal.  Left hemiscrotum is enlarged, tender, erythemic and indurated.  No fluctuance or crepitus is noted on today's exam.  Neurologic: Grossly intact, no focal deficits, moving all 4 extremities. Psychiatric: Normal mood and affect.   Laboratory Data: Results for orders placed or performed during the hospital encounter of 05/20/21  Resp Panel by RT-PCR (Flu A&B, Covid) Nasopharyngeal Swab   Specimen: Nasopharyngeal Swab; Nasopharyngeal(NP) swabs in vial transport medium  Result Value Ref Range   SARS Coronavirus 2 by RT PCR NEGATIVE NEGATIVE   Influenza A by PCR NEGATIVE NEGATIVE   Influenza B by PCR NEGATIVE NEGATIVE  Urine Culture   Specimen: Urine, Random  Result Value Ref Range   Specimen Description      URINE, RANDOM Performed at [facility], [address] [place], [place] [address]    Special Requests      NONE Performed at [facility], [address] [place], [place] [address]    Culture >=100,000 COLONIES/mL ENTEROCOCCUS FAECALIS (A)    Report Status 05/22/2021 FINAL    Organism ID, Bacteria ENTEROCOCCUS FAECALIS (A)       Susceptibility   Enterococcus faecalis - MIC*     AMPICILLIN <=2 SENSITIVE Sensitive     NITROFURANTOIN <=16 SENSITIVE Sensitive     VANCOMYCIN 1 SENSITIVE Sensitive     * >=100,000 COLONIES/mL ENTEROCOCCUS FAECALIS  Basic metabolic panel  Result Value Ref Range   Sodium 136 135 - 145 mmol/L   Potassium 4.4 3.5 - 5.1 mmol/L   Chloride 105 98 - 111 mmol/L   CO2 24 22 - 32 mmol/L   Glucose, Bld 98 70 - 99 mg/dL   BUN 15 8 - 23 mg/dL   Creatinine, Ser 0.55 (L) 0.61 - 1.24 mg/dL   Calcium 8.7 (L) 8.9 - 10.3 mg/dL   GFR, Estimated >60 >60 mL/min   Anion gap 7 5 - 15  CBC  Result Value Ref Range   WBC 8.6 4.0 - 10.5 K/uL   RBC 3.30 (L) 4.22 - 5.81 MIL/uL   Hemoglobin 10.8 (L) 13.0 - 17.0 g/dL   HCT 32.3 (L) 39.0 - 52.0 %   MCV 97.9 80.0 - 100.0 fL   MCH 32.7 26.0 - 34.0 pg   MCHC 33.4 30.0 - 36.0 g/dL   RDW 13.2 11.5 - 15.5 %   Platelets 218 150 - 400 K/uL   nRBC 0.0 0.0 - 0.2 %  CBC  Result Value Ref Range   WBC 8.6 4.0 - 10.5 K/uL   RBC 3.48 (L) 4.22 - 5.81 MIL/uL   Hemoglobin 11.1 (L) 13.0 - 17.0 g/dL   HCT 33.2 (L) 39.0 - 52.0 %   MCV 95.4 80.0 - 100.0 fL   MCH 31.9 26.0 - 34.0 pg   MCHC 33.4 30.0 - 36.0 g/dL   RDW 13.2 11.5 - 15.5 %   Platelets 212 150 - 400 K/uL   nRBC 0.0 0.0 - 0.2 %  Creatinine, serum  Result Value Ref Range   Creatinine, Ser 0.56 (L) 0.61 - 1.24 mg/dL   GFR, Estimated >60 >60 mL/min  Cortisol, Random  Result Value Ref Range   Cortisol, Plasma 14.7 ug/dL  Urinalysis, Complete w Microscopic  Result Value Ref Range   Color, Urine STRAW (A) YELLOW   APPearance CLEAR CLEAR   Specific Gravity, Urine <1.005 (L) 1.005 - 1.030   pH 6.5 5.0 - 8.0   Glucose, UA NEGATIVE NEGATIVE mg/dL   Hgb urine dipstick LARGE (A) NEGATIVE   Bilirubin Urine NEGATIVE NEGATIVE   Ketones, ur NEGATIVE NEGATIVE mg/dL   Protein, ur NEGATIVE NEGATIVE mg/dL  Nitrite NEGATIVE NEGATIVE   Leukocytes,Ua MODERATE (A) NEGATIVE   Squamous Epithelial / LPF 0-5 0 - 5   WBC, UA 21-50 0 - 5 WBC/hpf   RBC / HPF 6-10 0 - 5 RBC/hpf    Bacteria, UA RARE (A) NONE SEEN  Procalcitonin - Baseline  Result Value Ref Range   Procalcitonin <0.10 ng/mL  TSH  Result Value Ref Range   TSH 2.750 0.350 - 4.500 uIU/mL  CBC  Result Value Ref Range   WBC 6.7 4.0 - 10.5 K/uL   RBC 3.07 (L) 4.22 - 5.81 MIL/uL   Hemoglobin 9.8 (L) 13.0 - 17.0 g/dL   HCT 29.2 (L) 39.0 - 52.0 %   MCV 95.1 80.0 - 100.0 fL   MCH 31.9 26.0 - 34.0 pg   MCHC 33.6 30.0 - 36.0 g/dL   RDW 13.3 11.5 - 15.5 %   Platelets 191 150 - 400 K/uL   nRBC 0.0 0.0 - 0.2 %  Comprehensive metabolic panel  Result Value Ref Range   Sodium 140 135 - 145 mmol/L   Potassium 3.8 3.5 - 5.1 mmol/L   Chloride 109 98 - 111 mmol/L   CO2 23 22 - 32 mmol/L   Glucose, Bld 84 70 - 99 mg/dL   BUN 12 8 - 23 mg/dL   Creatinine, Ser 0.52 (L) 0.61 - 1.24 mg/dL   Calcium 8.7 (L) 8.9 - 10.3 mg/dL   Total Protein 6.1 (L) 6.5 - 8.1 g/dL   Albumin 2.6 (L) 3.5 - 5.0 g/dL   AST 13 (L) 15 - 41 U/L   ALT 15 0 - 44 U/L   Alkaline Phosphatase 84 38 - 126 U/L   Total Bilirubin 0.3 0.3 - 1.2 mg/dL   GFR, Estimated >60 >60 mL/min   Anion gap 8 5 - 15  I have reviewed the labs.   Pertinent Imaging: CLINICAL DATA:  Left epididymitis. Left testicular mass/left orchitis. 81 year old with urinary tract infection 3 weeks ago. Scrotal swelling started 5 days ago.   EXAM: SCROTAL ULTRASOUND   DOPPLER ULTRASOUND OF THE TESTICLES   TECHNIQUE: Complete ultrasound examination of the testicles, epididymis, and other scrotal structures was performed. Color and spectral Doppler ultrasound were also utilized to evaluate blood flow to the testicles.   COMPARISON:  No prior scrotal ultrasound. Abdominopelvic CT 03/28/2021   FINDINGS: Right testicle   Measurements: 3.9 x 2.2 x 3.2 cm. The testis is located in the inguinal canal. Mildly heterogeneous. No discrete mass or microlithiasis visualized. Normal testicular blood flow.   Left testicle   Measurements: 4.3 x 2.8 x 3.5 cm. 7 mm intra  testicular cyst. No solid lesion. Parenchymal echotexture is mildly heterogeneous. No microlithiasis. Normal blood flow.   Right epididymis: 6 mm epididymal head cyst. Normal in size and appearance.   Left epididymis: Not discretely visualized. Within the left hemiscrotum superior to the testis is a complex heterogeneous primarily hypoechoic lesion measuring 4.9 x 2.6 x 4.4 cm. In addition to hypoechoic areas there also cystic spaces, some of which contain mobile debris. No internal vascularity.   Hydrocele:  None visualized.   Varicocele:  None visualized.   Pulsed Doppler interrogation of both testes demonstrates normal low resistance arterial and venous waveforms bilaterally.   Other: There is scrotal skin thickening at 8-9 mm. Technically challenging exam due to patient's medical condition, contracted and unable to straighten lower extremity.   IMPRESSION: 1. Subcentimeter left intratesticular cyst. No solid intratesticular lesion. 2. Complex lesion in the left hemiscrotum  that is extratesticular. This is avascular, heterogeneously hypoechoic with cystic spaces which contains mobile debris. It is unclear if this represents a complex fluid collection in the hemiscrotum versus in avascular/partially cystic mass. The left epididymis is not discretely identified. Review of prior CT demonstrates soft tissue density in the left inguinal canal, however no discrete hernia on the current exam or on prior CT. 3. Bilateral scrotal skin thickening. 4. Right testis located in the inguinal canal.     Electronically Signed   By: Keith Rake M.D.   On: 05/18/2021 15:10 I have independently reviewed the films.  See HPI.    Assessment & Plan:    1.  Left epididymitis -Testicle is indurated and tender -1 Gram of Rocephin given in office -Continue Augmentin 875/125 twice daily tomorrow -Discussed placement of a PICC line, but staff stated he would not tolerate it as they have  tried it before the past -Offered daily IM injections here in the office, but patient refused -reviewed red flag signs with staff and instructed to seek treatment in the ED   2. Bilateral Nephrolithiasis -CTU demonstrates bilateral nephrolithiasis -Stone analysis reveals struvite stone -No hydronephrosis noted on CT, patient does not complain of flank pain and serum creatinine is normal, surgical intervention is not warranted as it would likely be poorly tolerated and patient  3.  Balanitis -resolved   Return for Follow up Tuesday .  These notes generated with voice recognition software. I apologize for typographical errors.  Zara Council, PA-C  [facility] [address]  [place], [place] [address] [phone]

## 2021-05-25 ENCOUNTER — Encounter: Payer: Self-pay | Admitting: Urology

## 2021-05-25 ENCOUNTER — Ambulatory Visit (INDEPENDENT_AMBULATORY_CARE_PROVIDER_SITE_OTHER): Payer: Medicare Other | Admitting: Urology

## 2021-05-25 ENCOUNTER — Other Ambulatory Visit: Payer: Self-pay

## 2021-05-25 ENCOUNTER — Other Ambulatory Visit: Payer: Self-pay | Admitting: Family Medicine

## 2021-05-25 VITALS — BP 100/63 | HR 66 | Ht 70.0 in | Wt 130.0 lb

## 2021-05-25 DIAGNOSIS — N451 Epididymitis: Secondary | ICD-10-CM | POA: Diagnosis not present

## 2021-05-25 DIAGNOSIS — G8929 Other chronic pain: Secondary | ICD-10-CM

## 2021-05-25 MED ORDER — CEFTRIAXONE SODIUM 1 G IJ SOLR
1.0000 g | Freq: Once | INTRAMUSCULAR | Status: AC
  Administered 2021-05-25: 1 g via INTRAMUSCULAR

## 2021-05-25 NOTE — Patient Instructions (Addendum)
Mr. Wetzel Bjornstad received 1 g of Rocephin here in the office today.  This is a 24-hour antibiotic coverage, so the evening Augmentin does not need to be given.  You may go ahead and restart the Augmentin tomorrow.  If between now and your return appointment on Tuesday, Mr. France develops fever, chills, nausea/vomiting, a drop in blood pressure, increased testicular swelling, purulent drainage from the scrotum, change in scrotal skin color (turning sunburned red, brown or black) or crackling under the skin, please seek treatment in the ED immediately.

## 2021-05-25 NOTE — Progress Notes (Signed)
Name: [name]   MRN: [id number]    DOB: [birth year]   Date:05/26/2021       Progress Note  [facility] discharge follow up:   Admission: 05/20/2021 Discharge: 05/21/2021  Patient was seen in our office on 05/19/20, he was bradycardic, lethargic and hypotensive, he had a tender and enlarged scrotum and also some left upper extremity weakness .   MRI was negative for stroke US scrotum showed complex lesion in the left scrotum and left epididymitis - seen by urologist yesterday was given a dose of Rocephin and sent home with Augmentin that he was instructed to start taking tomorrow Possible pneumonia  UTI - on antibiotics Pressure sore: sacrum and heel, stage I,using topical medication   He was given antibiotics, fluids   His albumin was down to 2.6 upon discharge  Hemoglobin dropped to 9.8 - we will recheck it today  Medication reconciliation was done Telephone call done by [facility] on 05/21/2021   He is feeling better, more alert, left arm./shoulder also improved in rom and will follow up with ortho. No fever or chills, appetite back to baseline   Patient Active Problem List   Diagnosis Date Noted   Weakness of left upper extremity 05/20/2021   Hypotension 05/20/2021   Acute blood loss anemia 05/09/2020   Rectal bleeding 05/09/2020   Hematochezia    HCAP (healthcare-associated pneumonia) 05/07/2020   BPH (benign prostatic hyperplasia)    Hyperlipidemia    Depression with anxiety    Acute metabolic encephalopathy    Protein-calorie malnutrition, severe 04/22/2020   Pressure injury of skin 04/22/2020   Multifocal pneumonia    Sepsis due to undetermined organism ([place]) 04/19/2020   Personal history of urinary calculi 06/21/2019   PAD (peripheral artery disease) ([place]) 07/04/2017   Aortic valve calcification 07/04/2017   Coronary artery calcification 07/04/2017   Late effect acute polio 05/17/2016   Chronic  constipation 11/19/2015   Mild major depression ([place]) 11/19/2015   Bacteriuria, chronic 09/11/2015   History of prostate cancer 03/02/2015   Amyotrophia 03/02/2015   Edema leg 03/02/2015   Cerebral palsy ([place]) 03/02/2015   Dyslipidemia 03/02/2015   Dermatitis, eczematoid 03/02/2015   Divergent squint 03/02/2015   H/O acute poliomyelitis 03/02/2015   Lymphedema 03/02/2015   Intellectual disability 03/02/2015   Hypertrophy of nail 03/02/2015   Chronic venous insufficiency 03/02/2015   Avitaminosis D 03/02/2015   Adynamia 03/02/2015   Dependent on wheelchair 03/02/2015   Renal cysts, acquired, bilateral 03/02/2015   At risk for falling 03/02/2015   Cerebral vascular disease 03/02/2015   Strabismus 02/11/2015   Urge incontinence of urine 02/11/2015   Wheelchair dependence 02/11/2015   Urinary frequency 10/30/2014    Past Surgical History:  Procedure Laterality Date   COLONOSCOPY WITH PROPOFOL N/A 05/10/2020   Procedure: COLONOSCOPY WITH PROPOFOL;  Surgeon: Lin Landsman, MD;  Location: [facility];  Service: Gastroenterology;  Laterality: N/A;   COLONOSCOPY WITH PROPOFOL N/A 05/12/2020   Procedure: COLONOSCOPY WITH PROPOFOL;  Surgeon: Lin Landsman, MD;  Location: [facility] ENDOSCOPY;  Service: Gastroenterology;  Laterality: N/A;   CYSTOSCOPY/URETEROSCOPY/HOLMIUM LASER/STENT PLACEMENT Right 08/14/2017   Procedure: CYSTOSCOPY/URETEROSCOPY/HOLMIUM LASER/STENT PLACEMENT;  Surgeon: Abbie Sons, MD;  Location: [facility] ORS;  Service: Urology;  Laterality: Right;   ESOPHAGOGASTRODUODENOSCOPY (EGD) WITH PROPOFOL N/A 05/12/2020   Procedure: ESOPHAGOGASTRODUODENOSCOPY (EGD) WITH PROPOFOL;  Surgeon: Lin Landsman, MD;  Location: [facility] ENDOSCOPY;  Service: Gastroenterology;  Laterality: N/A;  leg circulation surgery Right    PROSTATE SURGERY  11/10/2008   BRACHYTHERAPY    Family History  Problem Relation Age of Onset   Heart attack Brother    Heart disease Other      Social History   Tobacco Use   Smoking status: Never   Smokeless tobacco: Never  Substance Use Topics   Alcohol use: No    Alcohol/week: 0.0 standard drinks     Current Outpatient Medications:    acetaminophen (TYLENOL) 325 MG tablet, TAKE 2 TABLETS (650 MG TOTAL) BY MOUTH EVERY 6 HOURS AS NEEDED FOR MILD PAIN OR HEADACHE., Disp: 60 tablet, Rfl: 5   alfuzosin (UROXATRAL) 10 MG 24 hr tablet, TAKE 1 TABLET BY MOUTH DAILY, Disp: 90 tablet, Rfl: 1   ALPRAZolam (XANAX) 0.25 MG tablet, TAKE ONE TABLET BY MOUTH 2 TIMES A DAY AS NEEDED FOR ANXIETY (WHEN BLOOD PRESSURE IS VERY HIGH OR ANXIOUS), Disp: 20 tablet, Rfl: 0   amoxicillin-clavulanate (AUGMENTIN) 875-125 MG tablet, Take 1 tablet by mouth 2 (two) times daily for 7 days., Disp: 14 tablet, Rfl: 0   ARTIFICIAL TEARS 0.2-0.2-1 % SOLN, PLACE 2 DROPS INTO EYE 2 TIMES A DAY AS NEEDED, Disp: 15 mL, Rfl: 2   atorvastatin (LIPITOR) 40 MG tablet, TAKE 1 TABLET BY MOUTH EVERY EVENING, Disp: 90 tablet, Rfl: 1   augmented betamethasone dipropionate (DIPROLENE-AF) 0.05 % cream, APPLY TOPICALLY AS DIRECTED TO RASH AS NEEDED, Disp: 45 g, Rfl: 0   Calcium Carb-Cholecalciferol 500-10 MG-MCG TABS, TAKE 1 TABLET BY MOUTH 2 TIMES A DAY, Disp: 180 tablet, Rfl: 0   Cranberry Juice Powder 425 MG CAPS, TAKE 1 CAPSULE BY MOUTH ONCE DAILY, Disp: 90 capsule, Rfl: 1   Elastic Bandages & Supports (MEDICAL COMPRESSION STOCKINGS) MISC, 1 Units by Does not apply route daily., Disp: 2 each, Rfl: 0   GNP VITAMIN C 500 MG tablet, TAKE ONE TABLET BY MOUTH ONCE DAILY, Disp: 100 tablet, Rfl: 1   hydroxypropyl methylcellulose / hypromellose (ISOPTO TEARS / GONIOVISC) 2.5 % ophthalmic solution, Place 1 drop into both eyes 3 (three) times daily as needed for dry eyes., Disp: , Rfl:    Incontinence Supply Disposable (DEPEND SILHOUETTE BRIEFS L/XL) MISC, 1 each by Does not apply route 2 (two) times daily as needed., Disp: 60 each, Rfl: 5   Iron-FA-B Cmp-C-Biot-Probiotic (FUSION  PLUS) CAPS, TAKE 1 CAPSULE BY MOUTH DAILY, Disp: 30 capsule, Rfl: 2   lubiprostone (AMITIZA) 24 MCG capsule, Take 1 capsule (24 mcg total) by mouth 2 (two) times daily with a meal., Disp: 60 capsule, Rfl: 5   Nutritional Supplements (ENSURE ORIGINAL) LIQD, Take 1 Can by mouth 3 (three) times daily., Disp: 5.688 mL, Rfl: 5   nystatin cream (MYCOSTATIN), Apply 1 application topically 2 (two) times daily., Disp: 30 g, Rfl: 0   sertraline (ZOLOFT) 100 MG tablet, TAKE 1 TABLET BY MOUTH DAILY, Disp: 90 tablet, Rfl: 1  No Known Allergies  I personally reviewed active problem list, medication list, allergies, family history, social history, health maintenance with the patient/caregiver today.   ROS  Ten systems reviewed and is negative except as mentioned in HPI   Objective  Vitals:   05/26/21 1111  BP: 112/78  Pulse: 70  Resp: 16  SpO2: 96%  Weight: 136 lb (61.7 kg)    Body mass index is 19.51 kg/m.  Physical Exam  Constitutional: Patient appears well-developed and malnourished.  No distress.  HEENT: head atraumatic, normocephalic, neck supple Cardiovascular: Normal rate, regular rhythm  and normal heart sounds.  No murmur heard. No BLE edema. Pulmonary/Chest: Effort normal and breath sounds normal. No respiratory distress. Abdominal: Soft.  There is no tenderness. Scrotum: swollen, tender, red, but seems less red than last week.  Psychiatric: Patient has a normal mood and affect. behavior is normal. Judgment and thought content normal.  Muscular skeletal: atrophy of both legs and arms, hands, he was able to move both shoulders , abduction about 30 degrees but symmetrical   Recent Results (from the past 2160 hour(s))  Urinalysis, Complete     Status: Abnormal   Collection Time: 03/25/21 11:22 AM  Result Value Ref Range   Specific Gravity, UA 1.020 1.005 - 1.030   pH, UA 7.5 5.0 - 7.5   Color, UA Yellow Yellow   Appearance Ur Cloudy (A) Clear   Leukocytes,UA 3+ (A) Negative    Protein,UA 2+ (A) Negative/Trace   Glucose, UA Negative Negative   Ketones, UA Negative Negative   RBC, UA 2+ (A) Negative   Bilirubin, UA Negative Negative   Urobilinogen, Ur 0.2 0.2 - 1.0 mg/dL   Nitrite, UA Positive (A) Negative   Microscopic Examination See below:   Microscopic Examination     Status: Abnormal   Collection Time: 03/25/21 11:22 AM   Urine  Result Value Ref Range   WBC, UA >30 (A) 0 - 5 /hpf   RBC None seen 0 - 2 /hpf   Epithelial Cells (non renal) 0-10 0 - 10 /hpf   Renal Epithel, UA None seen None seen /hpf   Bacteria, UA Many (A) None seen/Few  BLADDER SCAN AMB NON-IMAGING     Status: None   Collection Time: 03/25/21 11:26 AM  Result Value Ref Range   Scan Result 160ml   CULTURE, URINE COMPREHENSIVE     Status: Abnormal   Collection Time: 03/25/21 11:44 AM   Specimen: Urine   UR  Result Value Ref Range   Urine Culture, Comprehensive Final report (A)    Organism ID, Bacteria Morganella morganii (A)     Comment: Multi-Drug Resistant Organism Greater than 100,000 colony forming units per mL    Organism ID, Bacteria Not applicable    ANTIMICROBIAL SUSCEPTIBILITY Comment     Comment:       ** S = Susceptible; I = Intermediate; R = Resistant **                    P = Positive; N = Negative             MICS are expressed in micrograms per mL    Antibiotic                 RSLT#1    RSLT#2    RSLT#3    RSLT#4 Amoxicillin/Clavulanic Acid    R Ampicillin                     R Cefazolin                      R Cefuroxime                     R Ciprofloxacin                  S Ertapenem                      S Gentamicin  S Imipenem                       S Levofloxacin                   S Meropenem                      S Nitrofurantoin                 R Piperacillin/Tazobactam        S Tetracycline                   S Tobramycin                     S Trimethoprim/Sulfa             S   Calculi, with Photograph (to Clinical Lab)     Status:  None   Collection Time: 03/25/21  1:11 PM  Result Value Ref Range   Source Calculi Comment     Comment: Not provided   Color Calculi Green    Size Calculi 13x7 mm    Comment: Multiple pieces received.  Dimensions of the largest piece reported.    Weight Calculi 566 mg   Composition Calculi Comment     Comment: Percentage (Represents the % composition)   Carbonate Apatite 30 %   Mg NH4 PO4 (Struvite) 70 %   Comment Calculi 1 Comment     Comment: Calcium phosphate (carbonate form) includes carbonate apatite and carbonated amorphous calcium phosphate. Carbonated calcium phosphate is more commonly associated with urinary infection.    Photo Calculi Comment     Comment: Photograph will follow under a separate cover   Comment Calculi 3 Comment     Comment: Physician questions regarding Calculi Analysis contact [facility] at: [phone].    Please Note: Comment     Comment: Calculi report will follow via computer, mail or courier delivery.    DISCLAIMER: Comment     Comment: This test was developed and its performance characteristics determined by [facility].  It has not been cleared or approved by the Food and Drug Administration.   Urinalysis, Complete     Status: Abnormal   Collection Time: 04/20/21 11:11 AM  Result Value Ref Range   Specific Gravity, UA 1.020 1.005 - 1.030   pH, UA 8.5 (H) 5.0 - 7.5   Color, UA Yellow Yellow   Appearance Ur Turbid (A) Clear   Leukocytes,UA 2+ (A) Negative   Protein,UA 3+ (A) Negative/Trace   Glucose, UA Negative Negative   Ketones, UA Negative Negative   RBC, UA 3+ (A) Negative   Bilirubin, UA Negative Negative   Urobilinogen, Ur 1.0 0.2 - 1.0 mg/dL   Nitrite, UA Negative Negative   Microscopic Examination See below:   Microscopic Examination     Status: Abnormal   Collection Time: 04/20/21 11:11 AM   Urine  Result Value Ref Range   WBC, UA >30 (A) 0 - 5 /hpf   RBC >30 (A) 0 - 2 /hpf   Epithelial Cells (non renal) 0-10 0 - 10 /hpf    Crystals Present (A) N/A   Crystal Type Amorphous Sediment N/A    Comment: Triple Phosphate   Bacteria, UA Many (A) None seen/Few  CULTURE, URINE COMPREHENSIVE     Status: Abnormal   Collection Time: 04/20/21 11:35 AM   Specimen: Urine   UR  Result  Value Ref Range   Urine Culture, Comprehensive Final report (A)    Organism ID, Bacteria Proteus mirabilis (A)     Comment: Multi-Drug Resistant Organism Greater than 100,000 colony forming units per mL    Organism ID, Bacteria Comment     Comment: Mixed urogenital flora 25,000-50,000 colony forming units per mL    ANTIMICROBIAL SUSCEPTIBILITY Comment     Comment:       ** S = Susceptible; I = Intermediate; R = Resistant **                    P = Positive; N = Negative             MICS are expressed in micrograms per mL    Antibiotic                 RSLT#1    RSLT#2    RSLT#3    RSLT#4 Amoxicillin/Clavulanic Acid    S Ampicillin                     S Cefazolin                      R Cefepime                       S Ceftriaxone                    S Cefuroxime                     S Ciprofloxacin                  S Ertapenem                      S Gentamicin                     S Levofloxacin                   S Meropenem                      S Nitrofurantoin                 R Piperacillin/Tazobactam        S Tetracycline                   R Tobramycin                     S Trimethoprim/Sulfa             S   Urinalysis, Complete     Status: Abnormal   Collection Time: 05/04/21  1:01 PM  Result Value Ref Range   Specific Gravity, UA 1.010 1.005 - 1.030   pH, UA 5.5 5.0 - 7.5   Color, UA Yellow Yellow   Appearance Ur Clear Clear   Leukocytes,UA 3+ (A) Negative   Protein,UA 1+ (A) Negative/Trace   Glucose, UA Negative Negative   Ketones, UA Negative Negative   RBC, UA 2+ (A) Negative   Bilirubin, UA Negative Negative   Urobilinogen, Ur 1.0 0.2 - 1.0 mg/dL   Nitrite, UA Negative Negative   Microscopic Examination See  below:   CULTURE, URINE COMPREHENSIVE     Status: Abnormal   Collection Time: 05/04/21  1:01 PM  Specimen: Urine   UR  Result Value Ref Range   Urine Culture, Comprehensive Final report (A)    Organism ID, Bacteria Enterococcus faecalis (A)     Comment: For Enterococcus species, aminoglycosides (except for high-level resistance screening), cephalosporins, clindamycin, and trimethoprim-sulfamethoxazole are not effective clinically. (CLSI, M100-S26, 2016) Greater than 100,000 colony forming units per mL    ANTIMICROBIAL SUSCEPTIBILITY Comment     Comment:       ** S = Susceptible; I = Intermediate; R = Resistant **                    P = Positive; N = Negative             MICS are expressed in micrograms per mL    Antibiotic                 RSLT#1    RSLT#2    RSLT#3    RSLT#4 Ciprofloxacin                  R Levofloxacin                   R Nitrofurantoin                 S Penicillin                     S Tetracycline                   R Vancomycin                     S   Microscopic Examination     Status: Abnormal   Collection Time: 05/04/21  1:01 PM   Urine  Result Value Ref Range   WBC, UA 11-30 (A) 0 - 5 /hpf   RBC >30 (A) 0 - 2 /hpf   Epithelial Cells (non renal) 0-10 0 - 10 /hpf   Bacteria, UA Few None seen/Few  Iron and TIBC     Status: Abnormal   Collection Time: 05/19/21  9:52 AM  Result Value Ref Range   Iron 35 (L) 45 - 182 ug/dL   TIBC 237 (L) 250 - 450 ug/dL   Saturation Ratios 15 (L) 17.9 - 39.5 %   UIBC 202 ug/dL    Comment: Performed at [facility], [address] [place], [place] [address]  Ferritin     Status: None   Collection Time: 05/19/21  9:52 AM  Result Value Ref Range   Ferritin 135 24 - 336 ng/mL    Comment: Performed at [facility], [address] [place], [place] [address]  Vitamin B12     Status: Abnormal   Collection Time: 05/19/21  9:52 AM  Result Value Ref Range   Vitamin B-12 998 (H) 180 - 914 pg/mL     Comment: (NOTE) This assay is not validated for testing neonatal or myeloproliferative syndrome specimens for Vitamin B12 levels. Performed at [facility], [place] [address] [place],  [address]   CBC with Differential     Status: Abnormal   Collection Time: 05/19/21  9:52 AM  Result Value Ref Range   WBC 7.4 4.0 - 10.5 K/uL   RBC 3.32 (L) 4.22 - 5.81 MIL/uL   Hemoglobin 10.9 (L) 13.0 - 17.0 g/dL   HCT 32.1 (L) 39.0 - 52.0 %   MCV 96.7 80.0 - 100.0 fL  MCH 32.8 26.0 - 34.0 pg   MCHC 34.0 30.0 - 36.0 g/dL   RDW 13.5 11.5 - 15.5 %   Platelets 198 150 - 400 K/uL   nRBC 0.0 0.0 - 0.2 %   Neutrophils Relative % 78 %   Neutro Abs 5.9 1.7 - 7.7 K/uL   Lymphocytes Relative 13 %   Lymphs Abs 0.9 0.7 - 4.0 K/uL   Monocytes Relative 7 %   Monocytes Absolute 0.5 0.1 - 1.0 K/uL   Eosinophils Relative 1 %   Eosinophils Absolute 0.1 0.0 - 0.5 K/uL   Basophils Relative 0 %   Basophils Absolute 0.0 0.0 - 0.1 K/uL   Immature Granulocytes 1 %   Abs Immature Granulocytes 0.04 0.00 - 0.07 K/uL    Comment: Performed at [facility], [address] [place], [place] [address]  Basic metabolic panel     Status: Abnormal   Collection Time: 05/20/21 11:26 AM  Result Value Ref Range   Sodium 136 135 - 145 mmol/L   Potassium 4.4 3.5 - 5.1 mmol/L    Comment: HEMOLYSIS AT THIS LEVEL MAY AFFECT RESULT   Chloride 105 98 - 111 mmol/L   CO2 24 22 - 32 mmol/L   Glucose, Bld 98 70 - 99 mg/dL    Comment: Glucose reference range applies only to samples taken after fasting for at least 8 hours.   BUN 15 8 - 23 mg/dL   Creatinine, Ser 0.55 (L) 0.61 - 1.24 mg/dL   Calcium 8.7 (L) 8.9 - 10.3 mg/dL   GFR, Estimated >60 >60 mL/min    Comment: (NOTE) Calculated using the CKD-EPI Creatinine Equation (2021)    Anion gap 7 5 - 15    Comment: Performed at [facility], [address] [place], [place] [address]  CBC     Status: Abnormal   Collection Time: 05/20/21 11:26 AM  Result  Value Ref Range   WBC 8.6 4.0 - 10.5 K/uL   RBC 3.30 (L) 4.22 - 5.81 MIL/uL   Hemoglobin 10.8 (L) 13.0 - 17.0 g/dL   HCT 32.3 (L) 39.0 - 52.0 %   MCV 97.9 80.0 - 100.0 fL   MCH 32.7 26.0 - 34.0 pg   MCHC 33.4 30.0 - 36.0 g/dL   RDW 13.2 11.5 - 15.5 %   Platelets 218 150 - 400 K/uL   nRBC 0.0 0.0 - 0.2 %    Comment: Performed at [facility], [address] [place], [place] [address]  Resp Panel by RT-PCR (Flu A&B, Covid) Nasopharyngeal Swab     Status: None   Collection Time: 05/20/21  1:00 PM   Specimen: Nasopharyngeal Swab; Nasopharyngeal(NP) swabs in vial transport medium  Result Value Ref Range   SARS Coronavirus 2 by RT PCR NEGATIVE NEGATIVE    Comment: (NOTE) SARS-CoV-2 target nucleic acids are NOT DETECTED.  The SARS-CoV-2 RNA is generally detectable in upper respiratory specimens during the acute phase of infection. The lowest concentration of SARS-CoV-2 viral copies this assay can detect is 138 copies/mL. A negative result does not preclude SARS-Cov-2 infection and should not be used as the sole basis for treatment or other patient management decisions. A negative result may occur with  improper specimen collection/handling, submission of specimen other than nasopharyngeal swab, presence of viral mutation(s) within the areas targeted by this assay, and inadequate number of viral copies(<138 copies/mL). A negative result must be combined with clinical observations, patient history, and epidemiological information. The expected result is  Negative.  Fact Sheet for Patients:  EntrepreneurPulse.com.au  Fact Sheet for Healthcare Providers:  IncredibleEmployment.be  This test is no t yet approved or cleared by the [place] FDA and  has been authorized for detection and/or diagnosis of SARS-CoV-2 by FDA under an Emergency Use Authorization (EUA). This EUA will remain  in effect (meaning this test can be used) for the  duration of the COVID-19 declaration under Section 564(b)(1) of the Act, 21 U.S.C.section 360bbb-3(b)(1), unless the authorization is terminated  or revoked sooner.       Influenza A by PCR NEGATIVE NEGATIVE   Influenza B by PCR NEGATIVE NEGATIVE    Comment: (NOTE) The Xpert Xpress SARS-CoV-2/FLU/RSV plus assay is intended as an aid in the diagnosis of influenza from Nasopharyngeal swab specimens and should not be used as a sole basis for treatment. Nasal washings and aspirates are unacceptable for Xpert Xpress SARS-CoV-2/FLU/RSV testing.  Fact Sheet for Patients: EntrepreneurPulse.com.au  Fact Sheet for Healthcare Providers: IncredibleEmployment.be  This test is not yet approved or cleared by the [place] FDA and has been authorized for detection and/or diagnosis of SARS-CoV-2 by FDA under an Emergency Use Authorization (EUA). This EUA will remain in effect (meaning this test can be used) for the duration of the COVID-19 declaration under Section 564(b)(1) of the Act, 21 U.S.C. section 360bbb-3(b)(1), unless the authorization is terminated or revoked.  Performed at [facility], [address] [place], [place] [address]   Urinalysis, Complete w Microscopic     Status: Abnormal   Collection Time: 05/20/21  3:44 PM  Result Value Ref Range   Color, Urine STRAW (A) YELLOW   APPearance CLEAR CLEAR   Specific Gravity, Urine <1.005 (L) 1.005 - 1.030   pH 6.5 5.0 - 8.0   Glucose, UA NEGATIVE NEGATIVE mg/dL   Hgb urine dipstick LARGE (A) NEGATIVE   Bilirubin Urine NEGATIVE NEGATIVE   Ketones, ur NEGATIVE NEGATIVE mg/dL   Protein, ur NEGATIVE NEGATIVE mg/dL   Nitrite NEGATIVE NEGATIVE   Leukocytes,Ua MODERATE (A) NEGATIVE   Squamous Epithelial / LPF 0-5 0 - 5   WBC, UA 21-50 0 - 5 WBC/hpf   RBC / HPF 6-10 0 - 5 RBC/hpf   Bacteria, UA RARE (A) NONE SEEN    Comment: Performed at [facility], [address],  [place], [place] [address]  Urine Culture     Status: Abnormal   Collection Time: 05/20/21  3:45 PM   Specimen: Urine, Random  Result Value Ref Range   Specimen Description      URINE, RANDOM Performed at [facility], [address] [place], [place] [address]    Special Requests      NONE Performed at [facility], [address] [place], [place] [address]    Culture >=100,000 COLONIES/mL ENTEROCOCCUS FAECALIS (A)    Report Status 05/22/2021 FINAL    Organism ID, Bacteria ENTEROCOCCUS FAECALIS (A)       Susceptibility   Enterococcus faecalis - MIC*    AMPICILLIN <=2 SENSITIVE Sensitive     NITROFURANTOIN <=16 SENSITIVE Sensitive     VANCOMYCIN 1 SENSITIVE Sensitive     * >=100,000 COLONIES/mL ENTEROCOCCUS FAECALIS  CBC     Status: Abnormal   Collection Time: 05/20/21  5:08 PM  Result Value Ref Range   WBC 8.6 4.0 - 10.5 K/uL   RBC 3.48 (L) 4.22 - 5.81 MIL/uL   Hemoglobin 11.1 (L) 13.0 - 17.0 g/dL   HCT 33.2 (L) 39.0 -  52.0 %   MCV 95.4 80.0 - 100.0 fL   MCH 31.9 26.0 - 34.0 pg   MCHC 33.4 30.0 - 36.0 g/dL   RDW 13.2 11.5 - 15.5 %   Platelets 212 150 - 400 K/uL   nRBC 0.0 0.0 - 0.2 %    Comment: Performed at [facility], [address] [place], [place] [address]  Creatinine, serum     Status: Abnormal   Collection Time: 05/20/21  5:08 PM  Result Value Ref Range   Creatinine, Ser 0.56 (L) 0.61 - 1.24 mg/dL   GFR, Estimated >60 >60 mL/min    Comment: (NOTE) Calculated using the CKD-EPI Creatinine Equation (2021) Performed at [facility], [address] [place], [place] [address]   Cortisol, Random     Status: None   Collection Time: 05/20/21  5:08 PM  Result Value Ref Range   Cortisol, Plasma 14.7 ug/dL    Comment: (NOTE) AM    6.7 - 22.6 ug/dL PM   <10.0       ug/dL Performed at [place] [address] [place], [place] [address]   Procalcitonin - Baseline     Status: None   Collection Time: 05/20/21  5:08 PM   Result Value Ref Range   Procalcitonin <0.10 ng/mL    Comment:        Interpretation: PCT (Procalcitonin) <= 0.5 ng/mL: Systemic infection (sepsis) is not likely. Local bacterial infection is possible. (NOTE)       Sepsis PCT Algorithm           Lower Respiratory Tract                                      Infection PCT Algorithm    ----------------------------     ----------------------------         PCT < 0.25 ng/mL                PCT < 0.10 ng/mL          Strongly encourage             Strongly discourage   discontinuation of antibiotics    initiation of antibiotics    ----------------------------     -----------------------------       PCT 0.25 - 0.50 ng/mL            PCT 0.10 - 0.25 ng/mL               OR       >80% decrease in PCT            Discourage initiation of                                            antibiotics      Encourage discontinuation           of antibiotics    ----------------------------     -----------------------------         PCT >= 0.50 ng/mL              PCT 0.26 - 0.50 ng/mL               AND        <80% decrease in PCT  Encourage initiation of                                             antibiotics       Encourage continuation           of antibiotics    ----------------------------     -----------------------------        PCT >= 0.50 ng/mL                  PCT > 0.50 ng/mL               AND         increase in PCT                  Strongly encourage                                      initiation of antibiotics    Strongly encourage escalation           of antibiotics                                     -----------------------------                                           PCT <= 0.25 ng/mL                                                 OR                                        > 80% decrease in PCT                                      Discontinue / Do not initiate                                              antibiotics  Performed at [facility], [address] [place], [place] [address]   TSH     Status: None   Collection Time: 05/20/21  5:08 PM  Result Value Ref Range   TSH 2.750 0.350 - 4.500 uIU/mL    Comment: Performed by a 3rd Generation assay with a functional sensitivity of <=0.01 uIU/mL. Performed at [facility], [address] [place], [place] [address]   CBC     Status: Abnormal   Collection Time: 05/21/21  5:54 AM  Result Value Ref Range   WBC 6.7 4.0 - 10.5 K/uL   RBC 3.07 (L) 4.22 - 5.81 MIL/uL   Hemoglobin 9.8 (L) 13.0 - 17.0 g/dL   HCT 29.2 (  L) 39.0 - 52.0 %   MCV 95.1 80.0 - 100.0 fL   MCH 31.9 26.0 - 34.0 pg   MCHC 33.6 30.0 - 36.0 g/dL   RDW 13.3 11.5 - 15.5 %   Platelets 191 150 - 400 K/uL   nRBC 0.0 0.0 - 0.2 %    Comment: Performed at [facility], [address] [place], [place] [address]  Comprehensive metabolic panel     Status: Abnormal   Collection Time: 05/21/21  5:54 AM  Result Value Ref Range   Sodium 140 135 - 145 mmol/L   Potassium 3.8 3.5 - 5.1 mmol/L   Chloride 109 98 - 111 mmol/L   CO2 23 22 - 32 mmol/L   Glucose, Bld 84 70 - 99 mg/dL    Comment: Glucose reference range applies only to samples taken after fasting for at least 8 hours.   BUN 12 8 - 23 mg/dL   Creatinine, Ser 0.52 (L) 0.61 - 1.24 mg/dL   Calcium 8.7 (L) 8.9 - 10.3 mg/dL   Total Protein 6.1 (L) 6.5 - 8.1 g/dL   Albumin 2.6 (L) 3.5 - 5.0 g/dL   AST 13 (L) 15 - 41 U/L   ALT 15 0 - 44 U/L   Alkaline Phosphatase 84 38 - 126 U/L   Total Bilirubin 0.3 0.3 - 1.2 mg/dL   GFR, Estimated >60 >60 mL/min    Comment: (NOTE) Calculated using the CKD-EPI Creatinine Equation (2021)    Anion gap 8 5 - 15    Comment: Performed at [facility], [address] [place], [place] [address]     PHQ2/9: Depression screen [facility] 2/9 05/26/2021 05/20/2021 08/19/2020 05/18/2020 03/01/2020  Decreased Interest 0 0 0 0 0  Down, Depressed, Hopeless 0 0 0 0 2   PHQ - 2 Score 0 0 0 0 2  Altered sleeping 0 0 - - 0  Tired, decreased energy 0 0 - - 0  Change in appetite 0 0 - - 0  Feeling bad or failure about yourself  0 0 - - 0  Trouble concentrating 0 0 - - 0  Moving slowly or fidgety/restless 0 0 - - 0  Suicidal thoughts 0 0 - - 0  PHQ-9 Score 0 0 - - 2  Difficult doing work/chores - - - - Somewhat difficult  Some recent data might be hidden    phq 9 is negative   Fall Risk: Fall Risk  05/26/2021 05/20/2021 08/19/2020 05/18/2020 03/01/2020  Falls in the past year? Exclusion - non ambulatory 0 0 0 0  Number falls in past yr: - 0 0 0 0  Injury with Fall? - 0 0 0 0  Risk Factor Category  - - - - -  Risk for fall due to : Impaired mobility;Impaired balance/gait Impaired mobility Impaired mobility - -  Follow up Falls prevention discussed Falls prevention discussed Falls prevention discussed - -      Functional Status Survey: Is the patient deaf or have difficulty hearing?: No Does the patient have difficulty seeing, even when wearing glasses/contacts?: No Does the patient have difficulty concentrating, remembering, or making decisions?: Yes Does the patient have difficulty walking or climbing stairs?: Yes Does the patient have difficulty dressing or bathing?: Yes Does the patient have difficulty doing errands alone such as visiting a doctor's office or shopping?: Yes    Assessment & Plan  1. Hospital discharge follow-up  - CBC with Differential/Platelet - BASIC METABOLIC PANEL WITH GFR  Keep follow up with Ortho but his rom seems to have improved since discharge  2. Epididymitis, left  Keep follow up with urologist and take antibiotics as prescribed   3. Moderate protein-calorie malnutrition ([place])  Discussed increase calorie and protein intake

## 2021-05-25 NOTE — Progress Notes (Signed)
IM Injection  Patient is present today for an IM Injection for treatment of epididymitis. Drug: Ceftriaxone Dose:1g Location:right upper outer buttocks Lot: [id number] Exp:05/2023 Patient tolerated well, no complications were noted.  Performed by: Bradly Bienenstock CMA

## 2021-05-26 ENCOUNTER — Ambulatory Visit (INDEPENDENT_AMBULATORY_CARE_PROVIDER_SITE_OTHER): Payer: Medicare Other | Admitting: Family Medicine

## 2021-05-26 ENCOUNTER — Encounter: Payer: Self-pay | Admitting: Family Medicine

## 2021-05-26 VITALS — BP 112/78 | HR 70 | Resp 16 | Wt 136.0 lb

## 2021-05-26 DIAGNOSIS — N3945 Continuous leakage: Secondary | ICD-10-CM | POA: Diagnosis not present

## 2021-05-26 DIAGNOSIS — N451 Epididymitis: Secondary | ICD-10-CM | POA: Diagnosis not present

## 2021-05-26 DIAGNOSIS — R32 Unspecified urinary incontinence: Secondary | ICD-10-CM | POA: Insufficient documentation

## 2021-05-26 DIAGNOSIS — Z09 Encounter for follow-up examination after completed treatment for conditions other than malignant neoplasm: Secondary | ICD-10-CM

## 2021-05-26 DIAGNOSIS — E440 Moderate protein-calorie malnutrition: Secondary | ICD-10-CM

## 2021-05-27 ENCOUNTER — Other Ambulatory Visit: Payer: Self-pay | Admitting: Family Medicine

## 2021-05-27 DIAGNOSIS — D72829 Elevated white blood cell count, unspecified: Secondary | ICD-10-CM

## 2021-05-27 LAB — CBC WITH DIFFERENTIAL/PLATELET
Absolute Monocytes: 763 cells/uL (ref 200–950)
Basophils Absolute: 29 cells/uL (ref 0–200)
Basophils Relative: 0.2 %
Eosinophils Absolute: 14 cells/uL — ABNORMAL LOW (ref 15–500)
Eosinophils Relative: 0.1 %
HCT: 32.8 % — ABNORMAL LOW (ref 38.5–50.0)
Hemoglobin: 11.3 g/dL — ABNORMAL LOW (ref 13.2–17.1)
Lymphs Abs: 850 cells/uL (ref 850–3900)
MCH: 32.8 pg (ref 27.0–33.0)
MCHC: 34.5 g/dL (ref 32.0–36.0)
MCV: 95.1 fL (ref 80.0–100.0)
MPV: 9.6 fL (ref 7.5–12.5)
Monocytes Relative: 5.3 %
Neutro Abs: 12744 cells/uL — ABNORMAL HIGH (ref 1500–7800)
Neutrophils Relative %: 88.5 %
Platelets: 278 10*3/uL (ref 140–400)
RBC: 3.45 10*6/uL — ABNORMAL LOW (ref 4.20–5.80)
RDW: 12.2 % (ref 11.0–15.0)
Total Lymphocyte: 5.9 %
WBC: 14.4 10*3/uL — ABNORMAL HIGH (ref 3.8–10.8)

## 2021-05-27 LAB — BASIC METABOLIC PANEL WITH GFR
BUN/Creatinine Ratio: 41 (calc) — ABNORMAL HIGH (ref 6–22)
BUN: 26 mg/dL — ABNORMAL HIGH (ref 7–25)
CO2: 27 mmol/L (ref 20–32)
Calcium: 9.1 mg/dL (ref 8.6–10.3)
Chloride: 102 mmol/L (ref 98–110)
Creat: 0.64 mg/dL — ABNORMAL LOW (ref 0.70–1.28)
Glucose, Bld: 80 mg/dL (ref 65–99)
Potassium: 4.5 mmol/L (ref 3.5–5.3)
Sodium: 137 mmol/L (ref 135–146)
eGFR: 97 mL/min/{1.73_m2} (ref >=60)

## 2021-05-28 ENCOUNTER — Emergency Department
Admission: EM | Admit: 2021-05-28 | Discharge: 2021-05-29 | Disposition: A | Discharge status: Nursing Home (Includes ICF) | Payer: Medicare Other | Attending: Emergency Medicine | Admitting: Emergency Medicine

## 2021-05-28 ENCOUNTER — Emergency Department: Payer: Medicare Other

## 2021-05-28 DIAGNOSIS — N492 Inflammatory disorders of scrotum: Secondary | ICD-10-CM | POA: Diagnosis present

## 2021-05-28 DIAGNOSIS — Z8546 Personal history of malignant neoplasm of prostate: Secondary | ICD-10-CM | POA: Diagnosis not present

## 2021-05-28 DIAGNOSIS — Z20822 Contact with and (suspected) exposure to covid-19: Secondary | ICD-10-CM | POA: Diagnosis not present

## 2021-05-28 LAB — COMPREHENSIVE METABOLIC PANEL
ALT: 21 U/L (ref 0–44)
AST: 23 U/L (ref 15–41)
Albumin: 2.9 g/dL — ABNORMAL LOW (ref 3.5–5.0)
Alkaline Phosphatase: 113 U/L (ref 38–126)
Anion gap: 4 — ABNORMAL LOW (ref 5–15)
BUN: 26 mg/dL — ABNORMAL HIGH (ref 8–23)
CO2: 26 mmol/L (ref 22–32)
Calcium: 8.7 mg/dL — ABNORMAL LOW (ref 8.9–10.3)
Chloride: 105 mmol/L (ref 98–111)
Creatinine, Ser: 0.75 mg/dL (ref 0.61–1.24)
GFR, Estimated: >60 mL/min (ref >60)
Glucose, Bld: 114 mg/dL — ABNORMAL HIGH (ref 70–99)
Potassium: 4.1 mmol/L (ref 3.5–5.1)
Sodium: 135 mmol/L (ref 135–145)
Total Bilirubin: 0.4 mg/dL (ref 0.3–1.2)
Total Protein: 7 g/dL (ref 6.5–8.1)

## 2021-05-28 LAB — CBC WITH DIFFERENTIAL/PLATELET
Abs Immature Granulocytes: 0.04 10*3/uL (ref 0.00–0.07)
Basophils Absolute: 0 10*3/uL (ref 0.0–0.1)
Basophils Relative: 0 %
Eosinophils Absolute: 0.1 10*3/uL (ref 0.0–0.5)
Eosinophils Relative: 1 %
HCT: 33.2 % — ABNORMAL LOW (ref 39.0–52.0)
Hemoglobin: 11.1 g/dL — ABNORMAL LOW (ref 13.0–17.0)
Immature Granulocytes: 1 %
Lymphocytes Relative: 20 %
Lymphs Abs: 1.7 10*3/uL (ref 0.7–4.0)
MCH: 32.5 pg (ref 26.0–34.0)
MCHC: 33.4 g/dL (ref 30.0–36.0)
MCV: 97.1 fL (ref 80.0–100.0)
Monocytes Absolute: 0.5 10*3/uL (ref 0.1–1.0)
Monocytes Relative: 6 %
Neutro Abs: 6.3 10*3/uL (ref 1.7–7.7)
Neutrophils Relative %: 72 %
Platelets: 240 10*3/uL (ref 150–400)
RBC: 3.42 MIL/uL — ABNORMAL LOW (ref 4.22–5.81)
RDW: 13.6 % (ref 11.5–15.5)
WBC: 8.6 10*3/uL (ref 4.0–10.5)
nRBC: 0 % (ref 0.0–0.2)

## 2021-05-28 LAB — RESP PANEL BY RT-PCR (FLU A&B, COVID) ARPGX2
Influenza A by PCR: NEGATIVE
Influenza B by PCR: NEGATIVE
SARS Coronavirus 2 by RT PCR: NEGATIVE

## 2021-05-28 MED ORDER — LEVOFLOXACIN 500 MG PO TABS
500.0000 mg | ORAL_TABLET | Freq: Once | ORAL | Status: AC
Start: 2021-05-28 — End: 2021-05-28
  Administered 2021-05-28: 500 mg via ORAL
  Filled 2021-05-28: qty 1

## 2021-05-28 MED ORDER — LEVOFLOXACIN 500 MG PO TABS: 500.0000 mg | ORAL_TABLET | Freq: Every day | ORAL | 0 refills | Status: AC

## 2021-05-28 NOTE — ED Triage Notes (Addendum)
Pt arrives with [facility] from [facility]) c/o abscess on scrotum that is leaking pus; pt is a&o to person (at baseline). Pt has hx of prostate CA.  EMS vitals:  121/58 98% O2 on RA 60-64 HR 138 CBG

## 2021-05-28 NOTE — Discharge Instructions (Addendum)
[name] seems to have a skin infection and an abscess of his left-sided scrotum.  He is started on 7 days of antibiotics, Levaquin.  We gave him the dose for today already, please start this medication tomorrow and continue once daily for the next 7 days.  Please change his dressings to this area regularly.  It is good that it is draining pus, but to avoid UTI or complications, please try to keep him as clean as you can  If he develops any fevers or worsening symptoms despite these measures, please return to the ED.  -Follow-up in the next 1 week with his urologist.

## 2021-05-28 NOTE — ED Provider Notes (Signed)
[facility] Provider Note    Event Date/Time   First MD Initiated Contact with Patient 05/28/21 2158     (approximate)   History   Abscess   HPI  [name] is a 81 y.o. male who presents to the ED for evaluation of Abscess    I reviewed DC summary from 1/28.  Patient was admitted medically for a couple days due to left upper extremity weakness, but no stroke on MRI. I review outpatient urology visit from 3 days ago.  History of prostate cancer.  No documentation of any scrotal abscess or discharge during this urology visit, but when he was admitted there was some concern for discharge from his left-sided scrotum and scrotal ultrasound was performed on 1/24 that I review demonstrating a complex lesion to left-sided hemiscrotum.   He is otherwise bedbound due to CP and lives at a local facility.  He presents today via EMS from his local facility for evaluation of draining purulence from his left-sided scrotum.  He cannot provide much history.  He denies any pain anywhere, but does yell out when I examine his scrotum and express purulence.  Physical Exam   Triage Vital Signs: ED Triage Vitals  Enc Vitals Group     BP      Pulse      Resp      Temp      Temp src      SpO2      Weight      Height      Head Circumference      Peak Flow      Pain Score      Pain Loc      Pain Edu?      Excl. in [place]?     Most recent vital signs: Vitals:   05/28/21 2200 05/28/21 2230  BP: 138/82 127/85  Pulse: 62 (!) 59  Resp: 11 15  Temp: (!) 97.5 F (36.4 C)   SpO2: 100% 99%    General: Awake, no distress.  Bedbound with chronic contractures. CV:  Good peripheral perfusion.  Resp:  Normal effort.  Abd:  No distention.  Benign frontal abdomen. GU:  Swollen and erythematous left hemiscrotum with punctate hole draining grossly purulent material with surrounding induration, especially below. MSK:  No deformity noted.  Chronic contractures are  present. Neuro:  No focal deficits appreciated. Other:       ED Results / Procedures / Treatments   Labs (all labs ordered are listed, but only abnormal results are displayed) Labs Reviewed  CBC WITH DIFFERENTIAL/PLATELET - Abnormal; Notable for the following components:      Result Value   RBC 3.42 (*)    Hemoglobin 11.1 (*)    HCT 33.2 (*)    All other components within normal limits  COMPREHENSIVE METABOLIC PANEL - Abnormal; Notable for the following components:   Glucose, Bld 114 (*)    BUN 26 (*)    Calcium 8.7 (*)    Albumin 2.9 (*)    Anion gap 4 (*)    All other components within normal limits  RESP PANEL BY RT-PCR (FLU A&B, COVID) ARPGX2  AEROBIC CULTURE W GRAM STAIN (SUPERFICIAL SPECIMEN)    EKG   RADIOLOGY Scrotal ultrasound reviewed by me with avascular fluid collection concerning for abscess  Official radiology report(s): US Scrotum  Result Date: 05/28/2021 CLINICAL DATA:  Left scrotal extratesticular mass, purulent drainage EXAM: ULTRASOUND OF SCROTUM TECHNIQUE: Complete ultrasound examination  of the testicles, epididymis, and other scrotal structures was performed. COMPARISON:  Testicular sonogram 05/17/2021 FINDINGS: Sonography of the left hemiscrotum was performed only. These images demonstrate thickening of the scrotal wall, similar to prior examination. Within the scrotal wall and external to the left scrotal sac is a a complex fluid collection demonstrating extensive internal echogenic debris in keeping with a intramural abscess or hematoma. There is hyperemia of the scrotal wall surrounding this collection, however, no internal vascularity is identified. This collection measures roughly 3.5 x 3.5 x 3.9 cm in greatest dimension. The left testicle appears displaced by the space-occupying fluid collection. While vascularity of the testicle is not well assessed on this examination, there is color flow vascularity identified, best appreciated on cine images. A a  simple cyst is seen within the epididymal head. IMPRESSION: Stable 3.9 cm complex avascular fluid collection within the scrotal wall external to the left scrotal sac most in keeping with an intramural abscess or hematoma. Overlying persistent scrotal wall thickening and hyperemia suggests an inflammatory process. Electronically Signed   By: Fidela Salisbury M.D.   On: 05/28/2021 23:00    PROCEDURES and INTERVENTIONS:  Procedures  Medications  levofloxacin (LEVAQUIN) tablet 500 mg (has no administration in time range)     IMPRESSION / MDM / ASSESSMENT AND PLAN / ED COURSE  I reviewed the triage vital signs and the nursing notes.  Bedbound 81 year old male presents to the ED with purulent drainage from his left-sided hemiscrotum, with evidence of cellulitis and already-draining abscess to his scrotum, ultimately suitable for outpatient management with initiation of antibiotics and following up closely with urology.  He looks systemically well and has no evidence of sepsis or systemic illness.  Blood work is benign, without leukocytosis.  No significant metabolic derangements.  Scrotal ultrasound demonstrates subcutaneous fluid collection and evidence of subcutaneous inflammation, I suspect representing localized cellulitis and abscess.  This is already draining purulent material, and I squeezed out what I can in the ED.  This is fairly well-tolerated.  I see no barriers to outpatient management at this point.  We will get him started on antibiotics, discharged with a 1 week prescription and have him follow-up closely with his urologist.      FINAL CLINICAL IMPRESSION(S) / ED DIAGNOSES   Final diagnoses:  Scrotal wall abscess  Cellulitis, scrotum     Rx / DC Orders   ED Discharge Orders          Ordered    levofloxacin (LEVAQUIN) 500 MG tablet  Daily        05/28/21 2311             Note:  This document was prepared using Dragon voice recognition software and may include  unintentional dictation errors.   Vladimir Crofts, MD 05/28/21 [phone]

## 2021-05-28 NOTE — ED Notes (Signed)
Report given to [name] at [facility]; staff member verbalized understanding. All questions were answered.

## 2021-05-29 NOTE — ED Notes (Signed)
[facility] Called for transportation

## 2021-05-30 ENCOUNTER — Other Ambulatory Visit: Payer: Self-pay

## 2021-05-30 DIAGNOSIS — D72829 Elevated white blood cell count, unspecified: Secondary | ICD-10-CM

## 2021-05-30 LAB — CBC WITH DIFFERENTIAL/PLATELET
Absolute Monocytes: 480 cells/uL (ref 200–950)
Basophils Absolute: 30 cells/uL (ref 0–200)
Basophils Relative: 0.4 %
Eosinophils Absolute: 38 cells/uL (ref 15–500)
Eosinophils Relative: 0.5 %
HCT: 31.6 % — ABNORMAL LOW (ref 38.5–50.0)
Hemoglobin: 10.7 g/dL — ABNORMAL LOW (ref 13.2–17.1)
Lymphs Abs: 1890 cells/uL (ref 850–3900)
MCH: 32 pg (ref 27.0–33.0)
MCHC: 33.9 g/dL (ref 32.0–36.0)
MCV: 94.6 fL (ref 80.0–100.0)
MPV: 9 fL (ref 7.5–12.5)
Monocytes Relative: 6.4 %
Neutro Abs: 5063 cells/uL (ref 1500–7800)
Neutrophils Relative %: 67.5 %
Platelets: 264 10*3/uL (ref 140–400)
RBC: 3.34 10*6/uL — ABNORMAL LOW (ref 4.20–5.80)
RDW: 12.5 % (ref 11.0–15.0)
Total Lymphocyte: 25.2 %
WBC: 7.5 10*3/uL (ref 3.8–10.8)

## 2021-05-30 NOTE — Progress Notes (Deleted)
05/31/2021 9:12 PM   [name] 10-Dec-1940 [id number]  Referring provider: Steele Sizer, MD [address] [place],  [place] [address]  No chief complaint on file.  Urological history 1.  History T1c intermediate risk prostate cancer -Gleason 3+4 diagnosed 2010 -Treated with brachytherapy 11/10/2008 -Last PSA 10/2015 <0.1   2.  Chronic bacteriuria -Cystoscopy Dr. Elnoria Howard 2015 unremarkable, nonocclusive prostate or stricture   3.  Stone disease - CT 2019 nonobstructing right mid ureteral calculus -Ureteroscopic stone removal 08/14/2016   4.  Renal cysts -CTU 2019 bilateral renal cysts with 2 indeterminate right renal lesions (18 mm interpolar left kidney and 15 mm anterior right kidney) -MRI 07/2017 showed the left renal lesion was not present and was a perfusion anomaly; right renal lesion a Bosniak 2 cyst -CTU 2022 - There is 1.9 cm smooth marginated low-density lesion in the midportion of left kidney. There is 2.2 cm cyst in the anterior midportion of right kidney. There is 2.3 cm exophytic lesion in the anterior midportion of right kidney, possibly cyst   HPI: [name] is a 81 y.o. male who presents today for recheck on left epididymitis, bilateral nephrolithiasis and balanitis with caregiver, Scientist, clinical (histocompatibility and immunogenetics).    He has been suffering from a left scrotal infection for several weeks.    Scrotal US 05/17/2021 - findings concerning for a complex hydrocele secondary to previous infection.    He was seen in the ED on 05/28/2020 for purulent drainage from the left hemiscrotum.       PMH: Past Medical History:  Diagnosis Date   Anxiety    Atrophy, disuse, muscle    BPH (benign prostatic hyperplasia)    Cerebral palsy (HCC)    Eczema    Elevated lipids    Elevated PSA    Exotropia    Hyperlipidemia    Kidney stone    Leukopenia    Lower extremity edema    lymphedema   Mental retardation    Over weight    Prostate cancer ([place])    Venous  insufficiency    Vitamin D deficiency    Wheelchair dependence     Surgical History: Past Surgical History:  Procedure Laterality Date   COLONOSCOPY WITH PROPOFOL N/A 05/10/2020   Procedure: COLONOSCOPY WITH PROPOFOL;  Surgeon: Lin Landsman, MD;  Location: [facility] ENDOSCOPY;  Service: Gastroenterology;  Laterality: N/A;   COLONOSCOPY WITH PROPOFOL N/A 05/12/2020   Procedure: COLONOSCOPY WITH PROPOFOL;  Surgeon: Lin Landsman, MD;  Location: [facility] ENDOSCOPY;  Service: Gastroenterology;  Laterality: N/A;   CYSTOSCOPY/URETEROSCOPY/HOLMIUM LASER/STENT PLACEMENT Right 08/14/2017   Procedure: CYSTOSCOPY/URETEROSCOPY/HOLMIUM LASER/STENT PLACEMENT;  Surgeon: Abbie Sons, MD;  Location: [facility] ORS;  Service: Urology;  Laterality: Right;   ESOPHAGOGASTRODUODENOSCOPY (EGD) WITH PROPOFOL N/A 05/12/2020   Procedure: ESOPHAGOGASTRODUODENOSCOPY (EGD) WITH PROPOFOL;  Surgeon: Lin Landsman, MD;  Location: [facility] ENDOSCOPY;  Service: Gastroenterology;  Laterality: N/A;   leg circulation surgery Right    PROSTATE SURGERY  11/10/2008   BRACHYTHERAPY    Home Medications:  Allergies as of 05/31/2021   No Known Allergies      Medication List        Accurate as of May 30, 2021  9:12 PM. If you have any questions, ask your nurse or doctor.          acetaminophen 325 MG tablet Commonly known as: TYLENOL TAKE 2 TABLETS (650 MG TOTAL) BY MOUTH EVERY 6 HOURS AS NEEDED FOR MILD PAIN OR HEADACHE.   alfuzosin  10 MG 24 hr tablet Commonly known as: UROXATRAL TAKE 1 TABLET BY MOUTH DAILY   ALPRAZolam 0.25 MG tablet Commonly known as: XANAX TAKE ONE TABLET BY MOUTH 2 TIMES A DAY AS NEEDED FOR ANXIETY (WHEN BLOOD PRESSURE IS VERY HIGH OR ANXIOUS)   Artificial Tears 0.2-0.2-1 % Soln Generic drug: Glycerin-Hypromellose-PEG 400 PLACE 2 DROPS INTO EYE 2 TIMES A DAY AS NEEDED   atorvastatin 40 MG tablet Commonly known as: LIPITOR TAKE 1 TABLET BY MOUTH EVERY EVENING   augmented  betamethasone dipropionate 0.05 % cream Commonly known as: DIPROLENE-AF APPLY TOPICALLY AS DIRECTED TO RASH AS NEEDED   Calcium Carb-Cholecalciferol 500-10 MG-MCG Tabs TAKE 1 TABLET BY MOUTH 2 TIMES A DAY   Cranberry Juice Powder 425 MG Caps TAKE 1 CAPSULE BY MOUTH ONCE DAILY   Depend Silhouette Briefs L/XL Misc 1 each by Does not apply route 2 (two) times daily as needed.   Ensure Original Liqd Take 1 Can by mouth 3 (three) times daily.   Fusion Plus Caps TAKE 1 CAPSULE BY MOUTH DAILY   GNP Vitamin C 500 MG tablet Generic drug: ascorbic acid TAKE ONE TABLET BY MOUTH ONCE DAILY   hydroxypropyl methylcellulose / hypromellose 2.5 % ophthalmic solution Commonly known as: ISOPTO TEARS / GONIOVISC Place 1 drop into both eyes 3 (three) times daily as needed for dry eyes.   levofloxacin 500 MG tablet Commonly known as: Levaquin Take 1 tablet (500 mg total) by mouth daily for 7 days.   lubiprostone 24 MCG capsule Commonly known as: AMITIZA Take 1 capsule (24 mcg total) by mouth 2 (two) times daily with a meal.   Medical Compression Stockings Misc 1 Units by Does not apply route daily.   nystatin cream Commonly known as: MYCOSTATIN Apply 1 application topically 2 (two) times daily.   sertraline 100 MG tablet Commonly known as: ZOLOFT TAKE 1 TABLET BY MOUTH DAILY        Allergies: No Known Allergies  Family History: Family History  Problem Relation Age of Onset   Heart attack Brother    Heart disease Other     Social History:  reports that he has never smoked. He has never used smokeless tobacco. He reports that he does not drink alcohol and does not use drugs.  ROS: Pertinent ROS in HPI  Physical Exam: There were no vitals taken for this visit.  Constitutional:  Well nourished. Alert and oriented, No acute distress. HEENT: [place] AT, moist mucus membranes.  Trachea midline Cardiovascular: No clubbing, cyanosis, or edema. Respiratory: Normal respiratory effort,  no increased work of breathing. GU: No CVA tenderness.  No bladder fullness or masses.  Patient with circumcised/uncircumcised phallus. ***Foreskin easily retracted***  Urethral meatus is patent.  No penile discharge. No penile lesions or rashes. Scrotum without lesions, cysts, rashes and/or edema.  Testicles are located scrotally bilaterally. No masses are appreciated in the testicles. Left and right epididymis are normal. Rectal: Patient with  normal sphincter tone. Anus and perineum without scarring or rashes. No rectal masses are appreciated. Prostate is approximately *** grams, *** nodules are appreciated. Seminal vesicles are normal. Neurologic: Grossly intact, no focal deficits, moving all 4 extremities. Psychiatric: Normal mood and affect.   Laboratory Data: CBC Latest Ref Rng & Units 05/30/2021 05/28/2021 05/26/2021  WBC 3.8 - 10.8 Thousand/uL 7.5 8.6 14.4(H)  Hemoglobin 13.2 - 17.1 g/dL 10.7(L) 11.1(L) 11.3(L)  Hematocrit 38.5 - 50.0 % 31.6(L) 33.2(L) 32.8(L)  Platelets 140 - 400 Thousand/uL 264 240 278  CMP Latest Ref Rng & Units 05/28/2021 05/26/2021 05/21/2021  Glucose 70 - 99 mg/dL 114(H) 80 84  BUN 8 - 23 mg/dL 26(H) 26(H) 12  Creatinine 0.61 - 1.24 mg/dL 0.75 0.64(L) 0.52(L)  Sodium 135 - 145 mmol/L 135 137 140  Potassium 3.5 - 5.1 mmol/L 4.1 4.5 3.8  Chloride 98 - 111 mmol/L 105 102 109  CO2 22 - 32 mmol/L 26 27 23   Calcium 8.9 - 10.3 mg/dL 8.7(L) 9.1 8.7(L)  Total Protein 6.5 - 8.1 g/dL 7.0 - 6.1(L)  Total Bilirubin 0.3 - 1.2 mg/dL 0.4 - 0.3  Alkaline Phos 38 - 126 U/L 113 - 84  AST 15 - 41 U/L 23 - 13(L)  ALT 0 - 44 U/L 21 - 15  I have reviewed the labs.   Pertinent Imaging: CLINICAL DATA:  Left scrotal extratesticular mass, purulent drainage   EXAM: ULTRASOUND OF SCROTUM   TECHNIQUE: Complete ultrasound examination of the testicles, epididymis, and other scrotal structures was performed.   COMPARISON:  Testicular sonogram 05/17/2021   FINDINGS: Sonography of the  left hemiscrotum was performed only. These images demonstrate thickening of the scrotal wall, similar to prior examination. Within the scrotal wall and external to the left scrotal sac is a a complex fluid collection demonstrating extensive internal echogenic debris in keeping with a intramural abscess or hematoma. There is hyperemia of the scrotal wall surrounding this collection, however, no internal vascularity is identified. This collection measures roughly 3.5 x 3.5 x 3.9 cm in greatest dimension.   The left testicle appears displaced by the space-occupying fluid collection. While vascularity of the testicle is not well assessed on this examination, there is color flow vascularity identified, best appreciated on cine images. A a simple cyst is seen within the epididymal head.   IMPRESSION: Stable 3.9 cm complex avascular fluid collection within the scrotal wall external to the left scrotal sac most in keeping with an intramural abscess or hematoma. Overlying persistent scrotal wall thickening and hyperemia suggests an inflammatory process.     Electronically Signed   By: Fidela Salisbury M.D.   On: 05/28/2021 23:00 I have independently reviewed the films.  See HPI.    Assessment & Plan:    1.  Left epididymitis -Testicle is indurated and tender -1 Gram of Rocephin given in office -Continue Augmentin 875/125 twice daily tomorrow -Discussed placement of a PICC line, but staff stated he would not tolerate it as they have tried it before the past -Offered daily IM injections here in the office, but patient refused -reviewed red flag signs with staff and instructed to seek treatment in the ED   2. Bilateral Nephrolithiasis -CTU demonstrates bilateral nephrolithiasis -Stone analysis reveals struvite stone -No hydronephrosis noted on CT, patient does not complain of flank pain and serum creatinine is normal, surgical intervention is not warranted as it would likely be poorly  tolerated and patient  3.  Balanitis -resolved   No follow-ups on file.  These notes generated with voice recognition software. I apologize for typographical errors.  Zara Council, PA-C  [facility] [address]  [place], [place] [address] [phone]

## 2021-05-31 ENCOUNTER — Encounter: Payer: Self-pay | Admitting: Urology

## 2021-05-31 ENCOUNTER — Ambulatory Visit: Payer: Medicare Other | Admitting: Urology

## 2021-05-31 DIAGNOSIS — N451 Epididymitis: Secondary | ICD-10-CM

## 2021-06-01 ENCOUNTER — Telehealth: Payer: Self-pay | Admitting: Urology

## 2021-06-01 LAB — AEROBIC CULTURE W GRAM STAIN (SUPERFICIAL SPECIMEN)

## 2021-06-01 NOTE — Telephone Encounter (Signed)
Would you call Mr. Yanni caregiver and see how he is doing?  He was seen in the emergency room over the weekend and a bunch of purulent drainage was expressed from his testicle and he missed his follow-up appointment yesterday.

## 2021-06-01 NOTE — Telephone Encounter (Signed)
Caregiver forgot about appt, appt rescheduled

## 2021-06-08 ENCOUNTER — Ambulatory Visit (INDEPENDENT_AMBULATORY_CARE_PROVIDER_SITE_OTHER): Payer: Medicare Other | Admitting: Urology

## 2021-06-08 ENCOUNTER — Other Ambulatory Visit: Payer: Self-pay

## 2021-06-08 ENCOUNTER — Encounter: Payer: Self-pay | Admitting: Urology

## 2021-06-08 VITALS — BP 92/62 | HR 93

## 2021-06-08 DIAGNOSIS — N390 Urinary tract infection, site not specified: Secondary | ICD-10-CM

## 2021-06-08 DIAGNOSIS — N481 Balanitis: Secondary | ICD-10-CM

## 2021-06-08 DIAGNOSIS — N451 Epididymitis: Secondary | ICD-10-CM | POA: Diagnosis not present

## 2021-06-08 MED ORDER — FLUCONAZOLE 100 MG PO TABS: 100.0000 mg | ORAL_TABLET | Freq: Every day | ORAL | 0 refills | Status: DC

## 2021-06-08 MED ORDER — AMOXICILLIN-POT CLAVULANATE 875-125 MG PO TABS: 1.0000 | ORAL_TABLET | Freq: Two times a day (BID) | ORAL | 0 refills | Status: DC

## 2021-06-08 NOTE — Progress Notes (Signed)
06/08/2021 11:57 AM   [name] Sep 12, 1940 [id number]  Referring provider: Steele Sizer, MD [address] [place],  [place] [address]  Chief Complaint  Patient presents with   Follow-up   Urological history 1.  History T1c intermediate risk prostate cancer -Gleason 3+4 diagnosed 2010 -Treated with brachytherapy 11/10/2008 -Last PSA 10/2015 <0.1   2.  Chronic bacteriuria -Cystoscopy Dr. Elnoria Howard 2015 unremarkable, nonocclusive prostate or stricture   3.  Stone disease - CT 2019 nonobstructing right mid ureteral calculus -Ureteroscopic stone removal 08/14/2016   4.  Renal cysts -CTU 2019 bilateral renal cysts with 2 indeterminate right renal lesions (18 mm interpolar left kidney and 15 mm anterior right kidney) -MRI 07/2017 showed the left renal lesion was not present and was a perfusion anomaly; right renal lesion a Bosniak 2 cyst -CTU 2022 - There is 1.9 cm smooth marginated low-density lesion in the midportion of left kidney. There is 2.2 cm cyst in the anterior midportion of right kidney. There is 2.3 cm exophytic lesion in the anterior midportion of right kidney, possibly cyst   HPI: DACODA FINLAY is a 81 y.o. male who presents today for recheck on left epididymitis, bilateral nephrolithiasis and balanitis with caregiver, Scientist, clinical (histocompatibility and immunogenetics).    He has been suffering from a left scrotal infection for several weeks.  Scrotal US 05/17/2021 - findings concerning for a complex hydrocele secondary to previous infection.  He was seen in the ED on 05/28/2020 for purulent drainage from the left hemiscrotum.    History was obtained through [place] by phone.  She states that he has not been complaining of testicular pain during bathing.  They have not seen any further scrotal swelling.  They have noticed maybe some yeasty material in the urine after he wets his incontinence pad.  Patient denies any modifying or aggravating factors.  Patient denies any gross hematuria, dysuria or  suprapubic/flank pain.  Patient denies any fevers, chills, nausea or vomiting.    PMH: Past Medical History:  Diagnosis Date   Anxiety    Atrophy, disuse, muscle    BPH (benign prostatic hyperplasia)    Cerebral palsy (HCC)    Eczema    Elevated lipids    Elevated PSA    Exotropia    Hyperlipidemia    Kidney stone    Leukopenia    Lower extremity edema    lymphedema   Mental retardation    Over weight    Prostate cancer ([place])    Venous insufficiency    Vitamin D deficiency    Wheelchair dependence     Surgical History: Past Surgical History:  Procedure Laterality Date   COLONOSCOPY WITH PROPOFOL N/A 05/10/2020   Procedure: COLONOSCOPY WITH PROPOFOL;  Surgeon: Lin Landsman, MD;  Location: [facility] ENDOSCOPY;  Service: Gastroenterology;  Laterality: N/A;   COLONOSCOPY WITH PROPOFOL N/A 05/12/2020   Procedure: COLONOSCOPY WITH PROPOFOL;  Surgeon: Lin Landsman, MD;  Location: [facility] ENDOSCOPY;  Service: Gastroenterology;  Laterality: N/A;   CYSTOSCOPY/URETEROSCOPY/HOLMIUM LASER/STENT PLACEMENT Right 08/14/2017   Procedure: CYSTOSCOPY/URETEROSCOPY/HOLMIUM LASER/STENT PLACEMENT;  Surgeon: Abbie Sons, MD;  Location: [facility] ORS;  Service: Urology;  Laterality: Right;   ESOPHAGOGASTRODUODENOSCOPY (EGD) WITH PROPOFOL N/A 05/12/2020   Procedure: ESOPHAGOGASTRODUODENOSCOPY (EGD) WITH PROPOFOL;  Surgeon: Lin Landsman, MD;  Location: [facility] ENDOSCOPY;  Service: Gastroenterology;  Laterality: N/A;   leg circulation surgery Right    PROSTATE SURGERY  11/10/2008   BRACHYTHERAPY    Home Medications:  Allergies as of 06/08/2021  No Known Allergies      Medication List        Accurate as of June 08, 2021 11:57 AM. If you have any questions, ask your nurse or doctor.          acetaminophen 325 MG tablet Commonly known as: TYLENOL TAKE 2 TABLETS (650 MG TOTAL) BY MOUTH EVERY 6 HOURS AS NEEDED FOR MILD PAIN OR HEADACHE.   alfuzosin 10 MG 24 hr tablet Commonly  known as: UROXATRAL TAKE 1 TABLET BY MOUTH DAILY   ALPRAZolam 0.25 MG tablet Commonly known as: XANAX TAKE ONE TABLET BY MOUTH 2 TIMES A DAY AS NEEDED FOR ANXIETY (WHEN BLOOD PRESSURE IS VERY HIGH OR ANXIOUS)   amoxicillin-clavulanate 875-125 MG tablet Commonly known as: AUGMENTIN Take 1 tablet by mouth every 12 (twelve) hours. Started by: Zara Council, PA-C   Artificial Tears 0.2-0.2-1 % Soln Generic drug: Glycerin-Hypromellose-PEG 400 PLACE 2 DROPS INTO EYE 2 TIMES A DAY AS NEEDED   atorvastatin 40 MG tablet Commonly known as: LIPITOR TAKE 1 TABLET BY MOUTH EVERY EVENING   augmented betamethasone dipropionate 0.05 % cream Commonly known as: DIPROLENE-AF APPLY TOPICALLY AS DIRECTED TO RASH AS NEEDED   Calcium Carb-Cholecalciferol 500-10 MG-MCG Tabs TAKE 1 TABLET BY MOUTH 2 TIMES A DAY   Cranberry Juice Powder 425 MG Caps TAKE 1 CAPSULE BY MOUTH ONCE DAILY   Depend Silhouette Briefs L/XL Misc 1 each by Does not apply route 2 (two) times daily as needed.   Ensure Original Liqd Take 1 Can by mouth 3 (three) times daily.   fluconazole 100 MG tablet Commonly known as: DIFLUCAN Take 1 tablet (100 mg total) by mouth daily. X 7 days Started by: Zara Council, PA-C   Fusion Plus Caps TAKE 1 CAPSULE BY MOUTH DAILY   GNP Vitamin C 500 MG tablet Generic drug: ascorbic acid TAKE ONE TABLET BY MOUTH ONCE DAILY   hydroxypropyl methylcellulose / hypromellose 2.5 % ophthalmic solution Commonly known as: ISOPTO TEARS / GONIOVISC Place 1 drop into both eyes 3 (three) times daily as needed for dry eyes.   lubiprostone 24 MCG capsule Commonly known as: AMITIZA Take 1 capsule (24 mcg total) by mouth 2 (two) times daily with a meal.   Medical Compression Stockings Misc 1 Units by Does not apply route daily.   nystatin cream Commonly known as: MYCOSTATIN Apply 1 application topically 2 (two) times daily.   sertraline 100 MG tablet Commonly known as: ZOLOFT TAKE 1 TABLET  BY MOUTH DAILY        Allergies: No Known Allergies  Family History: Family History  Problem Relation Age of Onset   Heart attack Brother    Heart disease Other     Social History:  reports that he has never smoked. He has never used smokeless tobacco. He reports that he does not drink alcohol and does not use drugs.  ROS: Pertinent ROS in HPI  Physical Exam: BP 92/62    Pulse 93   Constitutional:  Well nourished. Alert and oriented, No acute distress. HEENT: [place] AT, moist mucus membranes.  Trachea midline Cardiovascular: No clubbing, cyanosis, or edema. Respiratory: Normal respiratory effort, no increased work of breathing. GU: No CVA tenderness.  No bladder fullness or masses.  Patient with uncircumcised phallus. Foreskin easily retracted  Yeast material seen in the foreskin.  Urethral meatus is patent.  No penile discharge. No penile lesions or rashes. Scrotum without lesions, cysts, rashes and/or edema.  Testicles are located scrotally bilaterally. No masses  are appreciated in the right testicle. Right epididymis are normal.  The left testicle and epididymis are indurated, but I did not see any pain elicited during palpation.  I did not palpate any crepitus or fluctuance during the exam. Neurologic: Grossly intact, no focal deficits, moving all 4 extremities. Psychiatric: Normal mood and affect.   Laboratory Data: CBC Latest Ref Rng & Units 05/30/2021 05/28/2021 05/26/2021  WBC 3.8 - 10.8 Thousand/uL 7.5 8.6 14.4(H)  Hemoglobin 13.2 - 17.1 g/dL 10.7(L) 11.1(L) 11.3(L)  Hematocrit 38.5 - 50.0 % 31.6(L) 33.2(L) 32.8(L)  Platelets 140 - 400 Thousand/uL 264 240 278    CMP Latest Ref Rng & Units 05/28/2021 05/26/2021 05/21/2021  Glucose 70 - 99 mg/dL 114(H) 80 84  BUN 8 - 23 mg/dL 26(H) 26(H) 12  Creatinine 0.61 - 1.24 mg/dL 0.75 0.64(L) 0.52(L)  Sodium 135 - 145 mmol/L 135 137 140  Potassium 3.5 - 5.1 mmol/L 4.1 4.5 3.8  Chloride 98 - 111 mmol/L 105 102 109  CO2 22 - 32 mmol/L 26 27  23   Calcium 8.9 - 10.3 mg/dL 8.7(L) 9.1 8.7(L)  Total Protein 6.5 - 8.1 g/dL 7.0 - 6.1(L)  Total Bilirubin 0.3 - 1.2 mg/dL 0.4 - 0.3  Alkaline Phos 38 - 126 U/L 113 - 84  AST 15 - 41 U/L 23 - 13(L)  ALT 0 - 44 U/L 21 - 15  I have reviewed the labs.   Pertinent Imaging: CLINICAL DATA:  Left scrotal extratesticular mass, purulent drainage   EXAM: ULTRASOUND OF SCROTUM   TECHNIQUE: Complete ultrasound examination of the testicles, epididymis, and other scrotal structures was performed.   COMPARISON:  Testicular sonogram 05/17/2021   FINDINGS: Sonography of the left hemiscrotum was performed only. These images demonstrate thickening of the scrotal wall, similar to prior examination. Within the scrotal wall and external to the left scrotal sac is a a complex fluid collection demonstrating extensive internal echogenic debris in keeping with a intramural abscess or hematoma. There is hyperemia of the scrotal wall surrounding this collection, however, no internal vascularity is identified. This collection measures roughly 3.5 x 3.5 x 3.9 cm in greatest dimension.   The left testicle appears displaced by the space-occupying fluid collection. While vascularity of the testicle is not well assessed on this examination, there is color flow vascularity identified, best appreciated on cine images. A a simple cyst is seen within the epididymal head.   IMPRESSION: Stable 3.9 cm complex avascular fluid collection within the scrotal wall external to the left scrotal sac most in keeping with an intramural abscess or hematoma. Overlying persistent scrotal wall thickening and hyperemia suggests an inflammatory process.     Electronically Signed   By: Fidela Salisbury M.D.   On: 05/28/2021 23:00 I have independently reviewed the films.  See HPI.    Assessment & Plan:    1.  Left epididymitis -Continue Augmentin 875/125 twice daily  -Clearing infection has been challenging due to patient  having continuous incontinence and wearing depends -Patient will return next week for follow-up appointment --reviewed red flag signs with staff and instructed to seek treatment in the ED   2. Bilateral Nephrolithiasis -CTU demonstrates bilateral nephrolithiasis -Stone analysis reveals struvite stone -No hydronephrosis noted on CT, patient does not complain of flank pain and serum creatinine is normal, surgical intervention is not warranted as it would likely be poorly tolerated and patient  3.  Balanitis -Prescription for fluconazole 100 mg once daily for 7 days given  Return in about 1  week (around 06/15/2021) for follow up exam .  These notes generated with voice recognition software. I apologize for typographical errors.  Zara Council, PA-C  [facility] [address]  [place], [place] [address] [phone]

## 2021-06-14 NOTE — Progress Notes (Signed)
06/15/2021 10:35 AM   [name] 06-22-1940 [id number]  Referring provider: Steele Sizer, MD [address] [place],  [place] [address]  Chief Complaint  Patient presents with   Epididymitis   Urological history 1.  History T1c intermediate risk prostate cancer -Gleason 3+4 diagnosed 2010 -Treated with brachytherapy 11/10/2008 -Last PSA 10/2015 <0.1   2.  Chronic bacteriuria -Cystoscopy Dr. Elnoria Howard 2015 unremarkable, nonocclusive prostate or stricture   3.  Stone disease - CT 2019 nonobstructing right mid ureteral calculus -Ureteroscopic stone removal 08/14/2016   4.  Renal cysts -CTU 2019 bilateral renal cysts with 2 indeterminate right renal lesions (18 mm interpolar left kidney and 15 mm anterior right kidney) -MRI 07/2017 showed the left renal lesion was not present and was a perfusion anomaly; right renal lesion a Bosniak 2 cyst -CTU 2022 - There is 1.9 cm smooth marginated low-density lesion in the midportion of left kidney. There is 2.2 cm cyst in the anterior midportion of right kidney. There is 2.3 cm exophytic lesion in the anterior midportion of right kidney, possibly cyst   HPI: [name] is a 81 y.o. male who presents today for recheck on left epididymitis, bilateral nephrolithiasis and balanitis.    He has been suffering from a left scrotal infection for several weeks.  Scrotal US 05/17/2021 - findings concerning for a complex hydrocele secondary to previous infection.  He was seen in the ED on 05/28/2020 for purulent drainage from the left hemiscrotum.    History was obtained through [place] by phone.  She states that [name] has not complained of scrotal pain for the last several days.  They have not noticed any drainage from the scrotum.  The yeasty discharge from the penis that was present last week has abated.  They stated they have not noticed any fevers or chills nausea or vomiting with [place].   PMH: Past Medical History:  Diagnosis  Date   Anxiety    Atrophy, disuse, muscle    BPH (benign prostatic hyperplasia)    Cerebral palsy (HCC)    Eczema    Elevated lipids    Elevated PSA    Exotropia    Hyperlipidemia    Kidney stone    Leukopenia    Lower extremity edema    lymphedema   Mental retardation    Over weight    Prostate cancer ([place])    Venous insufficiency    Vitamin D deficiency    Wheelchair dependence     Surgical History: Past Surgical History:  Procedure Laterality Date   COLONOSCOPY WITH PROPOFOL N/A 05/10/2020   Procedure: COLONOSCOPY WITH PROPOFOL;  Surgeon: Lin Landsman, MD;  Location: [facility] ENDOSCOPY;  Service: Gastroenterology;  Laterality: N/A;   COLONOSCOPY WITH PROPOFOL N/A 05/12/2020   Procedure: COLONOSCOPY WITH PROPOFOL;  Surgeon: Lin Landsman, MD;  Location: [facility] ENDOSCOPY;  Service: Gastroenterology;  Laterality: N/A;   CYSTOSCOPY/URETEROSCOPY/HOLMIUM LASER/STENT PLACEMENT Right 08/14/2017   Procedure: CYSTOSCOPY/URETEROSCOPY/HOLMIUM LASER/STENT PLACEMENT;  Surgeon: Abbie Sons, MD;  Location: [facility] ORS;  Service: Urology;  Laterality: Right;   ESOPHAGOGASTRODUODENOSCOPY (EGD) WITH PROPOFOL N/A 05/12/2020   Procedure: ESOPHAGOGASTRODUODENOSCOPY (EGD) WITH PROPOFOL;  Surgeon: Lin Landsman, MD;  Location: [facility] ENDOSCOPY;  Service: Gastroenterology;  Laterality: N/A;   leg circulation surgery Right    PROSTATE SURGERY  11/10/2008   BRACHYTHERAPY    Home Medications:  Allergies as of 06/15/2021   No Known Allergies      Medication List  Accurate as of June 15, 2021 10:35 AM. If you have any questions, ask your nurse or doctor.          acetaminophen 325 MG tablet Commonly known as: TYLENOL TAKE 2 TABLETS (650 MG TOTAL) BY MOUTH EVERY 6 HOURS AS NEEDED FOR MILD PAIN OR HEADACHE.   alfuzosin 10 MG 24 hr tablet Commonly known as: UROXATRAL TAKE 1 TABLET BY MOUTH DAILY   ALPRAZolam 0.25 MG tablet Commonly known as: XANAX TAKE ONE TABLET  BY MOUTH 2 TIMES A DAY AS NEEDED FOR ANXIETY (WHEN BLOOD PRESSURE IS VERY HIGH OR ANXIOUS)   amoxicillin-clavulanate 875-125 MG tablet Commonly known as: AUGMENTIN Take 1 tablet by mouth every 12 (twelve) hours.   Artificial Tears 0.2-0.2-1 % Soln Generic drug: Glycerin-Hypromellose-PEG 400 PLACE 2 DROPS INTO EYE 2 TIMES A DAY AS NEEDED   atorvastatin 40 MG tablet Commonly known as: LIPITOR TAKE 1 TABLET BY MOUTH EVERY EVENING   augmented betamethasone dipropionate 0.05 % cream Commonly known as: DIPROLENE-AF APPLY TOPICALLY AS DIRECTED TO RASH AS NEEDED   Calcium Carb-Cholecalciferol 500-10 MG-MCG Tabs TAKE 1 TABLET BY MOUTH 2 TIMES A DAY   Cranberry Juice Powder 425 MG Caps TAKE 1 CAPSULE BY MOUTH ONCE DAILY   Depend Silhouette Briefs L/XL Misc 1 each by Does not apply route 2 (two) times daily as needed.   Ensure Original Liqd Take 1 Can by mouth 3 (three) times daily.   fluconazole 100 MG tablet Commonly known as: DIFLUCAN Take 1 tablet (100 mg total) by mouth daily. X 7 days   Fusion Plus Caps TAKE 1 CAPSULE BY MOUTH DAILY   GNP Vitamin C 500 MG tablet Generic drug: ascorbic acid TAKE ONE TABLET BY MOUTH ONCE DAILY   hydroxypropyl methylcellulose / hypromellose 2.5 % ophthalmic solution Commonly known as: ISOPTO TEARS / GONIOVISC Place 1 drop into both eyes 3 (three) times daily as needed for dry eyes.   lubiprostone 24 MCG capsule Commonly known as: AMITIZA Take 1 capsule (24 mcg total) by mouth 2 (two) times daily with a meal.   Medical Compression Stockings Misc 1 Units by Does not apply route daily.   nystatin cream Commonly known as: MYCOSTATIN Apply 1 application topically 2 (two) times daily.   sertraline 100 MG tablet Commonly known as: ZOLOFT TAKE 1 TABLET BY MOUTH DAILY        Allergies: No Known Allergies  Family History: Family History  Problem Relation Age of Onset   Heart attack Brother    Heart disease Other     Social  History:  reports that he has never smoked. He has never used smokeless tobacco. He reports that he does not drink alcohol and does not use drugs.  ROS: Pertinent ROS in HPI  Physical Exam: BP 102/72    Pulse (!) 59   Constitutional:  Well nourished. Alert and oriented, No acute distress. HEENT: [place] AT, mask in place trachea midline Cardiovascular: No clubbing, cyanosis, or edema. Respiratory: Normal respiratory effort, no increased work of breathing. GU: No CVA tenderness.  No bladder fullness or masses.  Patient with uncircumcised phallus. Foreskin easily retracted  Urethral meatus is patent.  No penile discharge. No penile lesions or rashes. Scrotum without lesions, cysts, rashes and/or edema.  Testicles are located scrotally bilaterally. No masses are appreciated in the right testicle. Right epididymis are normal.  Left testicle and left epididymis is hard and indurated.  There are no signs of pain when manipulating the testicle from [place].  I do not appreciate a fluctuant mass, crepitus or erythema. Neurologic: Grossly intact, no focal deficits, moving all 4 extremities. Psychiatric: Normal mood and affect.   Laboratory Data: N/A   Pertinent Imaging: N/A    Assessment & Plan:    1.  Left epididymitis -Continue Augmentin 875/125 twice daily for 1 more week -Advised staff of red flag signs and to call the office immediately or seek treatment in the ED -If patient should start to show signs of worsening infection, we will plan for left orchiectomy as definitive treatment  Return in about 1 week (around 06/22/2021) for exam.  These notes generated with voice recognition software. I apologize for typographical errors.  Zara Council, PA-C  [facility] [address]  [place], [place] [address] [phone]

## 2021-06-15 ENCOUNTER — Other Ambulatory Visit: Payer: Self-pay

## 2021-06-15 ENCOUNTER — Ambulatory Visit (INDEPENDENT_AMBULATORY_CARE_PROVIDER_SITE_OTHER): Payer: Medicare Other | Admitting: Urology

## 2021-06-15 ENCOUNTER — Encounter: Payer: Self-pay | Admitting: Urology

## 2021-06-15 VITALS — BP 102/72 | HR 59

## 2021-06-15 DIAGNOSIS — N451 Epididymitis: Secondary | ICD-10-CM | POA: Diagnosis not present

## 2021-06-15 MED ORDER — AMOXICILLIN-POT CLAVULANATE 875-125 MG PO TABS: 1.0000 | ORAL_TABLET | Freq: Two times a day (BID) | ORAL | 0 refills | Status: DC

## 2021-06-21 ENCOUNTER — Other Ambulatory Visit: Payer: Self-pay | Admitting: Family Medicine

## 2021-06-21 DIAGNOSIS — R350 Frequency of micturition: Secondary | ICD-10-CM

## 2021-06-21 DIAGNOSIS — N390 Urinary tract infection, site not specified: Secondary | ICD-10-CM

## 2021-06-21 DIAGNOSIS — E441 Mild protein-calorie malnutrition: Secondary | ICD-10-CM

## 2021-06-21 DIAGNOSIS — K5909 Other constipation: Secondary | ICD-10-CM

## 2021-06-21 DIAGNOSIS — I700 Atherosclerosis of aorta: Secondary | ICD-10-CM

## 2021-06-21 DIAGNOSIS — F320 Major depressive disorder, single episode, mild: Secondary | ICD-10-CM

## 2021-06-21 NOTE — Progress Notes (Deleted)
? ? ?06/22/2021 ?9:04 PM  ? ?[name] ?07/01/1940 ?[id number] ? ?Referring provider: Steele Sizer, MD ?[address] ?[address] ?[place] [address] ? ?No chief complaint on file. ? ?Urological history ?1.  History T1c intermediate risk prostate cancer ?-Gleason 3+4 diagnosed 2010 ?-Treated with brachytherapy 11/10/2008 ?-Last PSA 10/2015 <0.1 ?  ?2.  Chronic bacteriuria ?-Cystoscopy Dr. Elnoria Howard 2015 unremarkable, nonocclusive prostate or stricture ?  ?3.  Stone disease ?- CT 2019 nonobstructing right mid ureteral calculus ?-Ureteroscopic stone removal 08/14/2016 ?  ?4.  Renal cysts ?-CTU 2019 bilateral renal cysts with 2 indeterminate right renal lesions (18 mm interpolar left kidney and 15 mm anterior right kidney) ?-MRI 07/2017 showed the left renal lesion was not present and was a perfusion anomaly; right renal lesion a Bosniak 2 cyst ?-CTU 2022 - There is 1.9 cm smooth marginated low-density lesion in the midportion of left kidney. There is 2.2 cm cyst in the anterior midportion of right kidney. There is 2.3 cm exophytic lesion in the anterior midportion of right kidney, possibly cyst  ? ?HPI: ?[name] is a 81 y.o. male who presents today for recheck on left epididymitis, bilateral nephrolithiasis and balanitis.   ? ?He has been suffering from a left scrotal infection for several weeks.  Scrotal US 05/17/2021 - findings concerning for a complex hydrocele secondary to previous infection.  He was seen in the ED on 05/28/2020 for purulent drainage from the left hemiscrotum.   ? ? ?PMH: ?Past Medical History:  ?Diagnosis Date  ? Anxiety   ? Atrophy, disuse, muscle   ? BPH (benign prostatic hyperplasia)   ? Cerebral palsy ([place])   ? Eczema   ? Elevated lipids   ? Elevated PSA   ? Exotropia   ? Hyperlipidemia   ? Kidney stone   ? Leukopenia   ? Lower extremity edema   ? lymphedema  ? Mental retardation   ? Over weight   ? Prostate cancer ([place])   ? Venous insufficiency   ? Vitamin D deficiency   ?  Wheelchair dependence   ? ? ?Surgical History: ?Past Surgical History:  ?Procedure Laterality Date  ? COLONOSCOPY WITH PROPOFOL N/A 05/10/2020  ? Procedure: COLONOSCOPY WITH PROPOFOL;  Surgeon: Lin Landsman, MD;  Location: [facility] ENDOSCOPY;  Service: Gastroenterology;  Laterality: N/A;  ? COLONOSCOPY WITH PROPOFOL N/A 05/12/2020  ? Procedure: COLONOSCOPY WITH PROPOFOL;  Surgeon: Lin Landsman, MD;  Location: [facility] ENDOSCOPY;  Service: Gastroenterology;  Laterality: N/A;  ? CYSTOSCOPY/URETEROSCOPY/HOLMIUM LASER/STENT PLACEMENT Right 08/14/2017  ? Procedure: CYSTOSCOPY/URETEROSCOPY/HOLMIUM LASER/STENT PLACEMENT;  Surgeon: Abbie Sons, MD;  Location: [facility] ORS;  Service: Urology;  Laterality: Right;  ? ESOPHAGOGASTRODUODENOSCOPY (EGD) WITH PROPOFOL N/A 05/12/2020  ? Procedure: ESOPHAGOGASTRODUODENOSCOPY (EGD) WITH PROPOFOL;  Surgeon: Lin Landsman, MD;  Location: [facility] ENDOSCOPY;  Service: Gastroenterology;  Laterality: N/A;  ? leg circulation surgery Right   ? PROSTATE SURGERY  11/10/2008  ? BRACHYTHERAPY  ? ? ?Home Medications:  ?Allergies as of 06/22/2021   ?No Known Allergies ?  ? ?  ?Medication List  ?  ? ?  ? Accurate as of June 21, 2021  9:04 PM. If you have any questions, ask your nurse or doctor.  ?  ?  ? ?  ? ?acetaminophen 325 MG tablet ?Commonly known as: TYLENOL ?TAKE 2 TABLETS (650 MG TOTAL) BY MOUTH EVERY 6 HOURS AS NEEDED FOR MILD PAIN OR HEADACHE. ?  ?alfuzosin 10 MG 24 hr tablet ?Commonly known as: UROXATRAL ?  TAKE 1 TABLET BY MOUTH DAILY ?  ?ALPRAZolam 0.25 MG tablet ?Commonly known as: Duanne Moron ?TAKE ONE TABLET BY MOUTH 2 TIMES A DAY AS NEEDED FOR ANXIETY (WHEN BLOOD PRESSURE IS VERY HIGH OR ANXIOUS) ?  ?amoxicillin-clavulanate 875-125 MG tablet ?Commonly known as: AUGMENTIN ?Take 1 tablet by mouth every 12 (twelve) hours. ?  ?Artificial Tears 0.2-0.2-1 % Soln ?Generic drug: Glycerin-Hypromellose-PEG 400 ?PLACE 2 DROPS INTO EYE 2 TIMES A DAY AS NEEDED ?  ?atorvastatin 40 MG  tablet ?Commonly known as: LIPITOR ?TAKE 1 TABLET BY MOUTH EVERY EVENING ?  ?augmented betamethasone dipropionate 0.05 % cream ?Commonly known as: DIPROLENE-AF ?APPLY TOPICALLY AS DIRECTED TO RASH AS NEEDED ?  ?Calcium Carb-Cholecalciferol 500-10 MG-MCG Tabs ?TAKE 1 TABLET BY MOUTH 2 TIMES A DAY ?  ?Cranberry Juice Powder 425 MG Caps ?TAKE 1 CAPSULE BY MOUTH ONCE DAILY ?  ?Depend Silhouette Briefs L/XL Misc ?1 each by Does not apply route 2 (two) times daily as needed. ?  ?Ensure Original Liqd ?Take 1 Can by mouth 3 (three) times daily. ?  ?fluconazole 100 MG tablet ?Commonly known as: DIFLUCAN ?Take 1 tablet (100 mg total) by mouth daily. X 7 days ?  ?Fusion Plus Caps ?TAKE 1 CAPSULE BY MOUTH DAILY ?  ?GNP Vitamin C 500 MG tablet ?Generic drug: ascorbic acid ?TAKE ONE TABLET BY MOUTH ONCE DAILY ?  ?hydroxypropyl methylcellulose / hypromellose 2.5 % ophthalmic solution ?Commonly known as: ISOPTO TEARS / GONIOVISC ?Place 1 drop into both eyes 3 (three) times daily as needed for dry eyes. ?  ?lubiprostone 24 MCG capsule ?Commonly known as: AMITIZA ?Take 1 capsule (24 mcg total) by mouth 2 (two) times daily with a meal. ?  ?Medical Compression Stockings Misc ?1 Units by Does not apply route daily. ?  ?nystatin cream ?Commonly known as: MYCOSTATIN ?Apply 1 application topically 2 (two) times daily. ?  ?sertraline 100 MG tablet ?Commonly known as: ZOLOFT ?TAKE 1 TABLET BY MOUTH DAILY ?  ? ?  ? ? ?Allergies: No Known Allergies ? ?Family History: ?Family History  ?Problem Relation Age of Onset  ? Heart attack Brother   ? Heart disease Other   ? ? ?Social History:  reports that he has never smoked. He has never used smokeless tobacco. He reports that he does not drink alcohol and does not use drugs. ? ?ROS: ?Pertinent ROS in HPI ? ?Physical Exam: ?There were no vitals taken for this visit.  ?Constitutional:  Well nourished. Alert and oriented, No acute distress. ?HEENT: [place] AT, moist mucus membranes.  Trachea  midline ?Cardiovascular: No clubbing, cyanosis, or edema. ?Respiratory: Normal respiratory effort, no increased work of breathing. ?GU: No CVA tenderness.  No bladder fullness or masses.  Patient with circumcised/uncircumcised phallus. ***Foreskin easily retracted***  Urethral meatus is patent.  No penile discharge. No penile lesions or rashes. Scrotum without lesions, cysts, rashes and/or edema.  Testicles are located scrotally bilaterally. No masses are appreciated in the testicles. Left and right epididymis are normal. ?Rectal: Patient with  normal sphincter tone. Anus and perineum without scarring or rashes. No rectal masses are appreciated. Prostate is approximately *** grams, *** nodules are appreciated. Seminal vesicles are normal. ?Neurologic: Grossly intact, no focal deficits, moving all 4 extremities. ?Psychiatric: Normal mood and affect.  ? ?Laboratory Data: ?N/A ? ? ?Pertinent Imaging: ?N/A   ? ?Assessment & Plan:   ? ?1.  Left epididymitis ?-Continue Augmentin 875/125 twice daily for 1 more week ?-Advised staff of red flag signs and to call  the office immediately or seek treatment in the ED ?-If patient should start to show signs of worsening infection, we will plan for left orchiectomy as definitive treatment ? ?No follow-ups on file. ? ?These notes generated with voice recognition software. I apologize for typographical errors. ? ?Marice Angelino, PA-C ? ?[place] ?[place], [place] [address] ?[phone]  ?

## 2021-06-22 ENCOUNTER — Ambulatory Visit: Payer: Medicare Other | Admitting: Urology

## 2021-06-22 ENCOUNTER — Other Ambulatory Visit: Payer: Self-pay

## 2021-06-22 DIAGNOSIS — N451 Epididymitis: Secondary | ICD-10-CM

## 2021-06-24 ENCOUNTER — Other Ambulatory Visit: Payer: Self-pay

## 2021-06-24 ENCOUNTER — Encounter: Payer: Self-pay | Admitting: Urology

## 2021-06-24 ENCOUNTER — Ambulatory Visit (INDEPENDENT_AMBULATORY_CARE_PROVIDER_SITE_OTHER): Payer: Medicare Other | Admitting: Urology

## 2021-06-24 VITALS — BP 98/62 | HR 58

## 2021-06-24 DIAGNOSIS — N451 Epididymitis: Secondary | ICD-10-CM

## 2021-06-24 NOTE — Progress Notes (Signed)
? ? ?06/24/2021 ?11:47 AM  ? ?[name] ?Nov 12, 1940 ?[id number] ? ?Referring provider: Steele Sizer, MD ?[address] ?[address] ?[place],  [place] [address] ? ?Chief Complaint  ?Patient presents with  ? Epididymitis  ? ?Urological history ?1.  History T1c intermediate risk prostate cancer ?-Gleason 3+4 diagnosed 2010 ?-Treated with brachytherapy 11/10/2008 ?-Last PSA 10/2015 <0.1 ?  ?2.  Chronic bacteriuria ?-Cystoscopy Dr. Elnoria Howard 2015 unremarkable, nonocclusive prostate or stricture ?  ?3.  Stone disease ?- CT 2019 nonobstructing right mid ureteral calculus ?-Ureteroscopic stone removal 08/14/2016 ?  ?4.  Renal cysts ?-CTU 2019 bilateral renal cysts with 2 indeterminate right renal lesions (18 mm interpolar left kidney and 15 mm anterior right kidney) ?-MRI 07/2017 showed the left renal lesion was not present and was a perfusion anomaly; right renal lesion a Bosniak 2 cyst ?-CTU 2022 - There is 1.9 cm smooth marginated low-density lesion in the midportion of left kidney. There is 2.2 cm cyst in the anterior midportion of right kidney. There is 2.3 cm exophytic lesion in the anterior midportion of right kidney, possibly cyst  ? ?HPI: ?[name] is a 81 y.o. male who presents today for recheck on left epididymitis, bilateral nephrolithiasis and balanitis with caregiver, Scientist, clinical (histocompatibility and immunogenetics).  ? ?He has been suffering from a left scrotal infection for several weeks.  Scrotal US 05/17/2021 - findings concerning for a complex hydrocele secondary to previous infection.  He was seen in the ED on 05/28/2020 for purulent drainage from the left hemiscrotum.  ? ?History obtained from [name].  ? ?Since we last saw him, he continues not to have pain with the testicle.  Staff also states that he does not complain of discomfort while bathing.  They have not noticed any skin discoloration, swelling or drainage from the testicle. ? ?Staff denies any modifying or aggravating factors.  Staff denies any gross hematuria, dysuria or  suprapubic/flank pain.  Staff denies any fevers, chills, nausea or vomiting.   ? ? ?PMH: ?Past Medical History:  ?Diagnosis Date  ? Anxiety   ? Atrophy, disuse, muscle   ? BPH (benign prostatic hyperplasia)   ? Cerebral palsy ([place])   ? Eczema   ? Elevated lipids   ? Elevated PSA   ? Exotropia   ? Hyperlipidemia   ? Kidney stone   ? Leukopenia   ? Lower extremity edema   ? lymphedema  ? Mental retardation   ? Over weight   ? Prostate cancer ([place])   ? Venous insufficiency   ? Vitamin D deficiency   ? Wheelchair dependence   ? ? ?Surgical History: ?Past Surgical History:  ?Procedure Laterality Date  ? COLONOSCOPY WITH PROPOFOL N/A 05/10/2020  ? Procedure: COLONOSCOPY WITH PROPOFOL;  Surgeon: Lin Landsman, MD;  Location: [facility] ENDOSCOPY;  Service: Gastroenterology;  Laterality: N/A;  ? COLONOSCOPY WITH PROPOFOL N/A 05/12/2020  ? Procedure: COLONOSCOPY WITH PROPOFOL;  Surgeon: Lin Landsman, MD;  Location: [facility] ENDOSCOPY;  Service: Gastroenterology;  Laterality: N/A;  ? CYSTOSCOPY/URETEROSCOPY/HOLMIUM LASER/STENT PLACEMENT Right 08/14/2017  ? Procedure: CYSTOSCOPY/URETEROSCOPY/HOLMIUM LASER/STENT PLACEMENT;  Surgeon: Abbie Sons, MD;  Location: [facility] ORS;  Service: Urology;  Laterality: Right;  ? ESOPHAGOGASTRODUODENOSCOPY (EGD) WITH PROPOFOL N/A 05/12/2020  ? Procedure: ESOPHAGOGASTRODUODENOSCOPY (EGD) WITH PROPOFOL;  Surgeon: Lin Landsman, MD;  Location: [facility] ENDOSCOPY;  Service: Gastroenterology;  Laterality: N/A;  ? leg circulation surgery Right   ? PROSTATE SURGERY  11/10/2008  ? BRACHYTHERAPY  ? ? ?Home Medications:  ?Allergies as of 06/24/2021   ?  No Known Allergies ?  ? ?  ?Medication List  ?  ? ?  ? Accurate as of June 24, 2021 11:47 AM. If you have any questions, ask your nurse or doctor.  ?  ?  ? ?  ? ?STOP taking these medications   ? ?amoxicillin-clavulanate 875-125 MG tablet ?Commonly known as: AUGMENTIN ?Stopped by: Zara Council, PA-C ?  ? ?  ? ?TAKE these medications   ? ?acetaminophen  325 MG tablet ?Commonly known as: TYLENOL ?TAKE 2 TABLETS (650 MG TOTAL) BY MOUTH EVERY 6 HOURS AS NEEDED FOR MILD PAIN OR HEADACHE. ?  ?alfuzosin 10 MG 24 hr tablet ?Commonly known as: UROXATRAL ?TAKE 1 TABLET BY MOUTH DAILY ?  ?ALPRAZolam 0.25 MG tablet ?Commonly known as: Duanne Moron ?TAKE ONE TABLET BY MOUTH 2 TIMES A DAY AS NEEDED FOR ANXIETY (WHEN BLOOD PRESSURE IS VERY HIGH OR ANXIOUS) ?  ?Artificial Tears 0.2-0.2-1 % Soln ?Generic drug: Glycerin-Hypromellose-PEG 400 ?PLACE 2 DROPS INTO EYE 2 TIMES A DAY AS NEEDED ?  ?atorvastatin 40 MG tablet ?Commonly known as: LIPITOR ?TAKE 1 TABLET BY MOUTH EVERY EVENING ?  ?augmented betamethasone dipropionate 0.05 % cream ?Commonly known as: DIPROLENE-AF ?APPLY TOPICALLY AS DIRECTED TO RASH AS NEEDED ?  ?Calcium Carb-Cholecalciferol 500-10 MG-MCG Tabs ?TAKE 1 TABLET BY MOUTH 2 TIMES A DAY ?  ?Cranberry Juice Powder 425 MG Caps ?TAKE 1 CAPSULE BY MOUTH ONCE DAILY ?  ?Depend Silhouette Briefs L/XL Misc ?1 each by Does not apply route 2 (two) times daily as needed. ?  ?Ensure Original Liqd ?Take 1 Can by mouth 3 (three) times daily. ?  ?fluconazole 100 MG tablet ?Commonly known as: DIFLUCAN ?Take 1 tablet (100 mg total) by mouth daily. X 7 days ?  ?Fusion Plus Caps ?TAKE 1 CAPSULE BY MOUTH DAILY ?  ?GNP Vitamin C 500 MG tablet ?Generic drug: ascorbic acid ?TAKE ONE TABLET BY MOUTH ONCE DAILY ?  ?hydroxypropyl methylcellulose / hypromellose 2.5 % ophthalmic solution ?Commonly known as: ISOPTO TEARS / GONIOVISC ?Place 1 drop into both eyes 3 (three) times daily as needed for dry eyes. ?  ?lubiprostone 24 MCG capsule ?Commonly known as: AMITIZA ?TAKE 1 CAPSULE BY MOUTH 2 TIMES DAILY WITH A MEAL ?  ?Medical Compression Stockings Misc ?1 Units by Does not apply route daily. ?  ?nystatin cream ?Commonly known as: MYCOSTATIN ?Apply 1 application topically 2 (two) times daily. ?  ?sertraline 100 MG tablet ?Commonly known as: ZOLOFT ?TAKE 1 TABLET BY MOUTH DAILY ?  ? ?  ? ? ?Allergies:  No Known Allergies ? ?Family History: ?Family History  ?Problem Relation Age of Onset  ? Heart attack Brother   ? Heart disease Other   ? ? ?Social History:  reports that he has never smoked. He has never used smokeless tobacco. He reports that he does not drink alcohol and does not use drugs. ? ?ROS: ?Pertinent ROS in HPI ? ?Physical Exam: ?BP 98/62   Pulse (!) 58   ?Constitutional:  Well nourished. Alert and oriented, No acute distress. ?HEENT: [place] AT, mask in place.  Trachea midline ?Cardiovascular: No clubbing, cyanosis, or edema. ?Respiratory: Normal respiratory effort, no increased work of breathing. ?GU: No CVA tenderness.  No bladder fullness or masses.  Patient with uncircumcised phallus. Foreskin easily retracted  Urethral meatus is patent.  No penile discharge. No penile lesions or rashes. Scrotum without lesions, cysts, rashes and/or edema.  Testicles are located scrotally bilaterally. No masses are appreciated in the right testicles. Right epididymis  are normal.  Left testicle and left epididymitis are hard, but non-tender, no fluctuant mass, no erythema and no crepitus.   ?Neurologic: Grossly intact, no focal deficits, moving all 4 extremities. ?Psychiatric: Normal mood and affect.  ? ?Laboratory Data: ?N/A ? ? ?Pertinent Imaging: ?N/A   ? ?Assessment & Plan:   ? ?1.  Left epididymitis ?-Advised staff of red flag signs and to call the office immediately or seek treatment in the ED ?-If patient should start to show signs of worsening infection, we will plan for left orchiectomy as definitive treatment ? ?Return in about 1 month (around 07/25/2021) for symptom recheck . ? ?These notes generated with voice recognition software. I apologize for typographical errors. ? ?Jeanna Giuffre, PA-C ? ?[place] ?[place], [place] [address] ?[phone]  ?

## 2021-06-27 ENCOUNTER — Telehealth: Payer: Self-pay

## 2021-06-27 NOTE — Progress Notes (Signed)
? ? ?Chronic Care Management ?Pharmacy Assistant  ? ?Name: [name]  MRN: [id number] DOB: [birth year] ? ?Reason for Encounter: General Disease State Call ?  ?Recent office visits:  ?05/26/2021 Steele Sizer, MD (PCP Office Visit) for Hospital Follow-up- No medication changes noted, lab orders placed, no follow-up noted ? ?05/23/2021 Steele Sizer, MD (PCP Phone Call) for medication instructions- Stopped: Levofloxacin 500 mg, Sulfamethoxazole Trimethoprim, Trimethoprim 100 mg, first 2 medications stopped due to patient not taking, last medication provider discontinued, Started: Amoxicillin-Pot Clavulanate 875-125 mg 1 tablet twice daily ? ?05/20/2021 Steele Sizer, MD (PCP Office Visit) for L Hand problems- No medication changes noted, no orders placed, no follow-up noted ? ?Recent consult visits:  ?06/24/2021 Zara Council, PA-C (Urology) for Follow-up- Stopped: Amoxicillin-Pot Clavulanate due to patient completing course, No orders placed, patient to follow-up in 1 month ? ?06/15/2021 Zara Council, PA-C (Urology) for Follow-up- No medication changes noted, no orders placed, patient to follow-up in 1 week ? ?06/08/2021 Zara Council, PA-C (Urology) for Follow-up- Started: Amoxicillin-Pot Clavulanate 875-125 mg 1 tablet twice daily, Fluconazole 100 mg daily X 7 days, no orders placed, patient to follow-up in 1 week ? ?05/25/2021 Zara Council, PA-C (Urology) for Follow-up- No medication changes noted, no orders placed, patient to follow-up the following Tuesday ? ?05/19/2021 Beckey Rutter, NP (Oncology) for Follow-up- No medication changes noted, no orders placed, No follow-up noted ? ?05/04/2021 Zara Council, PA-C (Urology) for Nephrolithiasis-  Started: Nystatin [id number] Unit GM 1 application Topical 2 times daily, lab orders placed, Order for US Scrotum, patient to follow-up in 3 weeks ? ?04/26/2021 Debroah Loop, PA-C (Urology Telephone Call) Started: Trimethoprim 100 mg daily X 90  days. Per not patient to start after he completes Levaquin due to lab results ? ?04/20/2021 Debroah Loop, PA-C (Urology) for Hematuria- Started: Levofloxacin 500 mg daily, lab orders placed, patient to follow-up in 2 weeks ? ?Hospital visits:  ?Medication Reconciliation was completed by comparing discharge summary, patient?s EMR and Pharmacy list, and upon discussion with patient. ? ?Admitted to the hospital on 05/28/2021 due to Scrotal Wall Abscess. Discharge date was 05/29/2021. Discharged from [facility].   ? ?New?Medications Started at [facility] Discharge:?? ?Started-levofloxacin 500 MG tablet Take 1 tablet (500 mg total) by mouth daily for 7 days. ? ?Medication Changes at Hospital Discharge: ?-Changed None ID ? ?Medications Discontinued at Hospital Discharge: ?-Stopped None ID ? ?Medications that remain the same after Hospital Discharge:??  ?-All other medications will remain the same.   ? ?Medication Reconciliation was completed by comparing discharge summary, patient?s EMR and Pharmacy list, and upon discussion with patient. ? ?Admitted to the hospital on 05/20/2021 due to Cerebral Palsy. Discharge date was 05/21/2021. Discharged from [facility].   ? ?New?Medications Started at [facility] Discharge:?? ?-Started None ID ? ?Medication Changes at Hospital Discharge: ?-Changed None ID ? ?Medications Discontinued at Hospital Discharge: ?-Stopped None ID ? ?Medications that remain the same after Hospital Discharge:??  ?-All other medications will remain the same.   ? ?Medications: ?Outpatient Encounter Medications as of 06/27/2021  ?Medication Sig  ? acetaminophen (TYLENOL) 325 MG tablet TAKE 2 TABLETS (650 MG TOTAL) BY MOUTH EVERY 6 HOURS AS NEEDED FOR MILD PAIN OR HEADACHE.  ? alfuzosin (UROXATRAL) 10 MG 24 hr tablet TAKE 1 TABLET BY MOUTH DAILY  ? ALPRAZolam (XANAX) 0.25 MG tablet TAKE ONE TABLET BY MOUTH 2 TIMES A DAY AS NEEDED FOR ANXIETY (WHEN  BLOOD PRESSURE IS VERY HIGH OR ANXIOUS)  ?  ARTIFICIAL TEARS 0.2-0.2-1 % SOLN PLACE 2 DROPS INTO EYE 2 TIMES A DAY AS NEEDED  ? atorvastatin (LIPITOR) 40 MG tablet TAKE 1 TABLET BY MOUTH EVERY EVENING  ? augmented betamethasone dipropionate (DIPROLENE-AF) 0.05 % cream APPLY TOPICALLY AS DIRECTED TO RASH AS NEEDED  ? Calcium Carb-Cholecalciferol 500-10 MG-MCG TABS TAKE 1 TABLET BY MOUTH 2 TIMES A DAY  ? Cranberry Juice Powder 425 MG CAPS TAKE 1 CAPSULE BY MOUTH ONCE DAILY  ? Elastic Bandages & Supports (MEDICAL COMPRESSION STOCKINGS) MISC 1 Units by Does not apply route daily.  ? fluconazole (DIFLUCAN) 100 MG tablet Take 1 tablet (100 mg total) by mouth daily. X 7 days  ? GNP VITAMIN C 500 MG tablet TAKE ONE TABLET BY MOUTH ONCE DAILY  ? hydroxypropyl methylcellulose / hypromellose (ISOPTO TEARS / GONIOVISC) 2.5 % ophthalmic solution Place 1 drop into both eyes 3 (three) times daily as needed for dry eyes.  ? Incontinence Supply Disposable (DEPEND SILHOUETTE BRIEFS L/XL) MISC 1 each by Does not apply route 2 (two) times daily as needed.  ? Iron-FA-B Cmp-C-Biot-Probiotic (FUSION PLUS) CAPS TAKE 1 CAPSULE BY MOUTH DAILY  ? lubiprostone (AMITIZA) 24 MCG capsule TAKE 1 CAPSULE BY MOUTH 2 TIMES DAILY WITH A MEAL  ? Nutritional Supplements (ENSURE ORIGINAL) LIQD Take 1 Can by mouth 3 (three) times daily.  ? nystatin cream (MYCOSTATIN) Apply 1 application topically 2 (two) times daily.  ? sertraline (ZOLOFT) 100 MG tablet TAKE 1 TABLET BY MOUTH DAILY  ? ?No facility-administered encounter medications on file as of 06/27/2021.  ? ?Care Gaps: ?COVID-19 Vaccine Booster 3 ?Influenza Vaccine ? ?Star Rating Drugs: ?Atorvastatin 40 mg last filled on 06/21/2021 for a 30-Day supply with [facility] ? ?General Call ? ?I spoke with the patient's caregiver [name], and she advised that the patient is feeling much better. Per [name] the patient has improved regarding his Epididmyitits he followed-up with Urology last week. She does  report she notices another cyst developing on his scrotum. She stated she noticed it last night and this morning he did complain the area was a little sensitive for him. He has completed all his courses of antibiotics which has helped with the other cyst. She stated at this time there is no signs of infection, and no drainage. Patient is eating well no issues with his appetite. Per caregiver outside of the new cyst patient is doing well. I informed her that I would contact the Urologist office with the patient's symptoms and see if they can have a nurse give her a call. [name] verbalized understanding. ? ?I contacted the Urologist Office, but had to leave a message on their Triage line requesting someone to contact the caregiver [name] regarding the new cyst.  ? ?Lynann Bologna, CPA/CMA ?Clinical Pharmacist Assistant ?Phone: [phone]  ? ? ?

## 2021-06-28 ENCOUNTER — Other Ambulatory Visit: Payer: Self-pay | Admitting: Urology

## 2021-06-28 ENCOUNTER — Telehealth: Payer: Self-pay | Admitting: Urology

## 2021-06-28 ENCOUNTER — Encounter: Payer: Self-pay | Admitting: Urology

## 2021-06-28 ENCOUNTER — Other Ambulatory Visit: Payer: Self-pay

## 2021-06-28 ENCOUNTER — Ambulatory Visit (INDEPENDENT_AMBULATORY_CARE_PROVIDER_SITE_OTHER): Payer: Medicare Other | Admitting: Urology

## 2021-06-28 ENCOUNTER — Ambulatory Visit: Payer: Medicare Other | Admitting: Urology

## 2021-06-28 VITALS — BP 92/58 | HR 82

## 2021-06-28 DIAGNOSIS — N390 Urinary tract infection, site not specified: Secondary | ICD-10-CM | POA: Diagnosis not present

## 2021-06-28 DIAGNOSIS — N451 Epididymitis: Secondary | ICD-10-CM | POA: Diagnosis not present

## 2021-06-28 DIAGNOSIS — N492 Inflammatory disorders of scrotum: Secondary | ICD-10-CM

## 2021-06-28 MED ORDER — AMOXICILLIN-POT CLAVULANATE 875-125 MG PO TABS: 1.0000 | ORAL_TABLET | Freq: Two times a day (BID) | ORAL | 0 refills | Status: DC

## 2021-06-28 MED ORDER — FLUCONAZOLE 100 MG PO TABS: 100.0000 mg | ORAL_TABLET | Freq: Every day | ORAL | 0 refills | Status: DC

## 2021-06-28 NOTE — Progress Notes (Addendum)
06/28/2021 3:31 PM   [name] [birth year] [id number]  Referring provider: Steele Sizer, MD [address] [place],  [place] [address]  Chief Complaint  Patient presents with   Testicle Pain   Urological history 1.  History T1c intermediate risk prostate cancer -Gleason 3+4 diagnosed 2010 -Treated with brachytherapy 11/10/2008 -Last PSA 10/2015 <0.1   2.  Chronic bacteriuria -Cystoscopy Dr. Elnoria Howard 2015 unremarkable, nonocclusive prostate or stricture   3.  Stone disease - CT 2019 nonobstructing right mid ureteral calculus -Ureteroscopic stone removal 08/14/2016   4.  Renal cysts -CTU 2019 bilateral renal cysts with 2 indeterminate right renal lesions (18 mm interpolar left kidney and 15 mm anterior right kidney) -MRI 07/2017 showed the left renal lesion was not present and was a perfusion anomaly; right renal lesion a Bosniak 2 cyst -CTU 2022 - There is 1.9 cm smooth marginated low-density lesion in the midportion of left kidney. There is 2.2 cm cyst in the anterior midportion of right kidney. There is 2.3 cm exophytic lesion in the anterior midportion of right kidney, possibly cyst   HPI: [name] is a 81 y.o. male who presents today for recheck on left epididymitis, bilateral nephrolithiasis and balanitis with caregiver, Scientist, clinical (histocompatibility and immunogenetics).   He has been suffering from a left scrotal infection for several weeks.  Scrotal US 05/17/2021 - findings concerning for a complex hydrocele secondary to previous infection.  He was seen in the ED on 05/28/2020 for purulent drainage from the left hemiscrotum.   History obtained from [place].   Staff states they have noticed that the scrotal swelling has increased and [name] is now complaining of pain during bathing.    They have also noted a yeasty discharge.    Patient denies any modifying or aggravating factors.  Patient denies any gross hematuria, dysuria or suprapubic/flank pain.  Patient denies any fevers, chills, nausea  or vomiting.    PMH: Past Medical History:  Diagnosis Date   Anxiety    Atrophy, disuse, muscle    BPH (benign prostatic hyperplasia)    Cerebral palsy (HCC)    Eczema    Elevated lipids    Elevated PSA    Exotropia    Hyperlipidemia    Kidney stone    Leukopenia    Lower extremity edema    lymphedema   Mental retardation    Over weight    Prostate cancer ([place])    Venous insufficiency    Vitamin D deficiency    Wheelchair dependence     Surgical History: Past Surgical History:  Procedure Laterality Date   COLONOSCOPY WITH PROPOFOL N/A 05/10/2020   Procedure: COLONOSCOPY WITH PROPOFOL;  Surgeon: Lin Landsman, MD;  Location: [facility] ENDOSCOPY;  Service: Gastroenterology;  Laterality: N/A;   COLONOSCOPY WITH PROPOFOL N/A 05/12/2020   Procedure: COLONOSCOPY WITH PROPOFOL;  Surgeon: Lin Landsman, MD;  [facility] ENDOSCOPY;  Service: Gastroenterology;  Laterality: N/A;   CYSTOSCOPY/URETEROSCOPY/HOLMIUM LASER/STENT PLACEMENT Right 08/14/2017   Procedure: CYSTOSCOPY/URETEROSCOPY/HOLMIUM LASER/STENT PLACEMENT;  Surgeon: Abbie Sons, MD;  Location: [facility] ORS;  Service: Urology;  Laterality: Right;   ESOPHAGOGASTRODUODENOSCOPY (EGD) WITH PROPOFOL N/A 05/12/2020   Procedure: ESOPHAGOGASTRODUODENOSCOPY (EGD) WITH PROPOFOL;  Surgeon: Lin Landsman, MD;  Location: [facility] ENDOSCOPY;  Service: Gastroenterology;  Laterality: N/A;   leg circulation surgery Right    PROSTATE SURGERY  11/10/2008   BRACHYTHERAPY    Home Medications:  Allergies as of 06/28/2021   No Known Allergies      Medication  List        Accurate as of June 28, 2021  3:31 PM. If you have any questions, ask your nurse or doctor.          acetaminophen 325 MG tablet Commonly known as: TYLENOL TAKE 2 TABLETS (650 MG TOTAL) BY MOUTH EVERY 6 HOURS AS NEEDED FOR MILD PAIN OR HEADACHE.   alfuzosin 10 MG 24 hr tablet Commonly known as: UROXATRAL TAKE 1 TABLET BY MOUTH DAILY   ALPRAZolam 0.25  MG tablet Commonly known as: XANAX TAKE ONE TABLET BY MOUTH 2 TIMES A DAY AS NEEDED FOR ANXIETY (WHEN BLOOD PRESSURE IS VERY HIGH OR ANXIOUS)   amoxicillin-clavulanate 875-125 MG tablet Commonly known as: AUGMENTIN Take 1 tablet by mouth every 12 (twelve) hours. Started by: Zara Council, PA-C   Artificial Tears 0.2-0.2-1 % Soln Generic drug: Glycerin-Hypromellose-PEG 400 PLACE 2 DROPS INTO EYE 2 TIMES A DAY AS NEEDED   atorvastatin 40 MG tablet Commonly known as: LIPITOR TAKE 1 TABLET BY MOUTH EVERY EVENING   augmented betamethasone dipropionate 0.05 % cream Commonly known as: DIPROLENE-AF APPLY TOPICALLY AS DIRECTED TO RASH AS NEEDED   Calcium Carb-Cholecalciferol 500-10 MG-MCG Tabs TAKE 1 TABLET BY MOUTH 2 TIMES A DAY   Cranberry Juice Powder 425 MG Caps TAKE 1 CAPSULE BY MOUTH ONCE DAILY   Depend Silhouette Briefs L/XL Misc 1 each by Does not apply route 2 (two) times daily as needed.   Ensure Original Liqd Take 1 Can by mouth 3 (three) times daily.   fluconazole 100 MG tablet Commonly known as: DIFLUCAN Take 1 tablet (100 mg total) by mouth daily. X 7 days   Fusion Plus Caps TAKE 1 CAPSULE BY MOUTH DAILY   GNP Vitamin C 500 MG tablet Generic drug: ascorbic acid TAKE ONE TABLET BY MOUTH ONCE DAILY   hydroxypropyl methylcellulose / hypromellose 2.5 % ophthalmic solution Commonly known as: ISOPTO TEARS / GONIOVISC Place 1 drop into both eyes 3 (three) times daily as needed for dry eyes.   lubiprostone 24 MCG capsule Commonly known as: AMITIZA TAKE 1 CAPSULE BY MOUTH 2 TIMES DAILY WITH A MEAL   Medical Compression Stockings Misc 1 Units by Does not apply route daily.   nystatin cream Commonly known as: MYCOSTATIN Apply 1 application topically 2 (two) times daily.   sertraline 100 MG tablet Commonly known as: ZOLOFT TAKE 1 TABLET BY MOUTH DAILY        Allergies: No Known Allergies  Family History: Family History  Problem Relation Age of Onset    Heart attack Brother    Heart disease Other     Social History:  reports that he has never smoked. He has never used smokeless tobacco. He reports that he does not drink alcohol and does not use drugs.  ROS: Pertinent ROS in HPI  Physical Exam: BP (!) 92/58    Pulse 82   Constitutional:  Well nourished. Alert and oriented, No acute distress. HEENT: [place] AT, mask in place.  Trachea midline Cardiovascular: No clubbing, cyanosis, or edema. Respiratory: Normal respiratory effort, no increased work of breathing. GU: No CVA tenderness.  No bladder fullness or masses.  Patient with uncircumcised phallus. Foreskin easily retracted.  Yeasty drainage seen in depends.  Urethral meatus is patent.  No penile discharge. No penile lesions or rashes. Scrotum with mild erythema with thickening of the scrotal skin and induration.  It is tender to patient with palpation.  No crepitus or distinct fluctuant mass noted.   Neurologic: Grossly  intact, no focal deficits, moving all 4 extremities. Psychiatric: Normal mood and affect.   Laboratory Data: N/A   Pertinent Imaging: N/A    Assessment & Plan:    1.  Left epididymitis -Scrotum is likely containing pockets of phlegmon and will need to be addressed in the operating room for I&D and scrotal exploration -There is not a distinct area of fluctuation that one can I&D here in the office, but it is possible that it will drain spontaneously again in the future -We will restart the patient's Diflucan and Augmentin 875/125, BID x 7 days  -I have spoken with his legal guardian, [name], about proceeding with the scrotal expiration I&D in the OR how the procedures performed, the risks involved and aftercare for the wound and would like [name] to proceed with the procedure -Red flags reviewed with patient's staff (fevers, chills, nausea/vomiting, increased scrotal pain, discoloration of scrotal skin and/or increased scrotal swelling)  Return for I & D of  scrotum in the OR .  These notes generated with voice recognition software. I apologize for typographical errors.  Zara Council, PA-C  [facility] [address]  [place], [place] [address] [phone]   I spent 25 minutes on the day of the encounter to include pre-visit record review, face-to-face time with the patient, and post-visit ordering of tests.

## 2021-06-28 NOTE — H&P (View-Only) (Signed)
? ? ?06/28/2021 ?3:31 PM  ? ?[name] ?March 08, 1941 ?[id number] ? ?Referring provider: Steele Sizer, MD ?[address] ?[place],  [place] [address] ? ?Chief Complaint  ?Patient presents with  ? Testicle Pain  ? ?Urological history ?1.  History T1c intermediate risk prostate cancer ?-Gleason 3+4 diagnosed 2010 ?-Treated with brachytherapy 11/10/2008 ?-Last PSA 10/2015 <0.1 ?  ?2.  Chronic bacteriuria ?-Cystoscopy Dr. Elnoria Howard 2015 unremarkable, nonocclusive prostate or stricture ?  ?3.  Stone disease ?- CT 2019 nonobstructing right mid ureteral calculus ?-Ureteroscopic stone removal 08/14/2016 ?  ?4.  Renal cysts ?-CTU 2019 bilateral renal cysts with 2 indeterminate right renal lesions (18 mm interpolar left kidney and 15 mm anterior right kidney) ?-MRI 07/2017 showed the left renal lesion was not present and was a perfusion anomaly; right renal lesion a Bosniak 2 cyst ?-CTU 2022 - There is 1.9 cm smooth marginated low-density lesion in the midportion of left kidney. There is 2.2 cm cyst in the anterior midportion of right kidney. There is 2.3 cm exophytic lesion in the anterior midportion of right kidney, possibly cyst  ? ?HPI: ?[name] is a 81 y.o. male who presents today for recheck on left epididymitis, bilateral nephrolithiasis and balanitis with caregiver, Scientist, clinical (histocompatibility and immunogenetics).  ? ?He has been suffering from a left scrotal infection for several weeks.  Scrotal US 05/17/2021 - findings concerning for a complex hydrocele secondary to previous infection.  He was seen in the ED on 05/28/2020 for purulent drainage from the left hemiscrotum.  ? ?History obtained from [place].  ? ?Staff states they have noticed that the scrotal swelling has increased and [name] is now complaining of pain during bathing.   ? ?They have also noted a yeasty discharge.   ? ?Patient denies any modifying or aggravating factors.  Patient denies any gross hematuria, dysuria or suprapubic/flank pain.  Patient denies any fevers, chills, nausea  or vomiting.   ? ?PMH: ?Past Medical History:  ?Diagnosis Date  ? Anxiety   ? Atrophy, disuse, muscle   ? BPH (benign prostatic hyperplasia)   ? Cerebral palsy ([place])   ? Eczema   ? Elevated lipids   ? Elevated PSA   ? Exotropia   ? Hyperlipidemia   ? Kidney stone   ? Leukopenia   ? Lower extremity edema   ? lymphedema  ? Mental retardation   ? Over weight   ? Prostate cancer ([place])   ? Venous insufficiency   ? Vitamin D deficiency   ? Wheelchair dependence   ? ? ?Surgical History: ?Past Surgical History:  ?Procedure Laterality Date  ? COLONOSCOPY WITH PROPOFOL N/A 05/10/2020  ? Procedure: COLONOSCOPY WITH PROPOFOL;  Surgeon: Lin Landsman, MD;  [facility] ENDOSCOPY;  Service: Gastroenterology;  Laterality: N/A;  ? COLONOSCOPY WITH PROPOFOL N/A 05/12/2020  ? Procedure: COLONOSCOPY WITH PROPOFOL;  Surgeon: Lin Landsman, MD;  Location[facility] ENDOSCOPY;  Service: Gastroenterology;  Laterality: N/A;  ? CYSTOSCOPY/URETEROSCOPY/HOLMIUM LASER/STENT PLACEMENT Right 08/14/2017  ? Procedure: CYSTOSCOPY/URETEROSCOPY/HOLMIUM LASER/STENT PLACEMENT;  Surgeon: Abbie Sons, MD;  Location: [facility] ORS;  Service: Urology;  Laterality: Right;  ? ESOPHAGOGASTRODUODENOSCOPY (EGD) WITH PROPOFOL N/A 05/12/2020  ? Procedure: ESOPHAGOGASTRODUODENOSCOPY (EGD) WITH PROPOFOL;  Surgeon: Lin Landsman, MD;  Location: [facility] ENDOSCOPY;  Service: Gastroenterology;  Laterality: N/A;  ? leg circulation surgery Right   ? PROSTATE SURGERY  11/10/2008  ? BRACHYTHERAPY  ? ? ?Home Medications:  ?Allergies as of 06/28/2021   ?No Known Allergies ?  ? ?  ?Medication  List  ?  ? ?  ? Accurate as of June 28, 2021  3:31 PM. If you have any questions, ask your nurse or doctor.  ?  ?  ? ?  ? ?acetaminophen 325 MG tablet ?Commonly known as: TYLENOL ?TAKE 2 TABLETS (650 MG TOTAL) BY MOUTH EVERY 6 HOURS AS NEEDED FOR MILD PAIN OR HEADACHE. ?  ?alfuzosin 10 MG 24 hr tablet ?Commonly known as: UROXATRAL ?TAKE 1 TABLET BY MOUTH DAILY ?  ?ALPRAZolam 0.25  MG tablet ?Commonly known as: Duanne Moron ?TAKE ONE TABLET BY MOUTH 2 TIMES A DAY AS NEEDED FOR ANXIETY (WHEN BLOOD PRESSURE IS VERY HIGH OR ANXIOUS) ?  ?amoxicillin-clavulanate 875-125 MG tablet ?Commonly known as: AUGMENTIN ?Take 1 tablet by mouth every 12 (twelve) hours. ?Started by: Zara Council, PA-C ?  ?Artificial Tears 0.2-0.2-1 % Soln ?Generic drug: Glycerin-Hypromellose-PEG 400 ?PLACE 2 DROPS INTO EYE 2 TIMES A DAY AS NEEDED ?  ?atorvastatin 40 MG tablet ?Commonly known as: LIPITOR ?TAKE 1 TABLET BY MOUTH EVERY EVENING ?  ?augmented betamethasone dipropionate 0.05 % cream ?Commonly known as: DIPROLENE-AF ?APPLY TOPICALLY AS DIRECTED TO RASH AS NEEDED ?  ?Calcium Carb-Cholecalciferol 500-10 MG-MCG Tabs ?TAKE 1 TABLET BY MOUTH 2 TIMES A DAY ?  ?Cranberry Juice Powder 425 MG Caps ?TAKE 1 CAPSULE BY MOUTH ONCE DAILY ?  ?Depend Silhouette Briefs L/XL Misc ?1 each by Does not apply route 2 (two) times daily as needed. ?  ?Ensure Original Liqd ?Take 1 Can by mouth 3 (three) times daily. ?  ?fluconazole 100 MG tablet ?Commonly known as: DIFLUCAN ?Take 1 tablet (100 mg total) by mouth daily. X 7 days ?  ?Fusion Plus Caps ?TAKE 1 CAPSULE BY MOUTH DAILY ?  ?GNP Vitamin C 500 MG tablet ?Generic drug: ascorbic acid ?TAKE ONE TABLET BY MOUTH ONCE DAILY ?  ?hydroxypropyl methylcellulose / hypromellose 2.5 % ophthalmic solution ?Commonly known as: ISOPTO TEARS / GONIOVISC ?Place 1 drop into both eyes 3 (three) times daily as needed for dry eyes. ?  ?lubiprostone 24 MCG capsule ?Commonly known as: AMITIZA ?TAKE 1 CAPSULE BY MOUTH 2 TIMES DAILY WITH A MEAL ?  ?Medical Compression Stockings Misc ?1 Units by Does not apply route daily. ?  ?nystatin cream ?Commonly known as: MYCOSTATIN ?Apply 1 application topically 2 (two) times daily. ?  ?sertraline 100 MG tablet ?Commonly known as: ZOLOFT ?TAKE 1 TABLET BY MOUTH DAILY ?  ? ?  ? ? ?Allergies: No Known Allergies ? ?Family History: ?Family History  ?Problem Relation Age of Onset   ? Heart attack Brother   ? Heart disease Other   ? ? ?Social History:  reports that he has never smoked. He has never used smokeless tobacco. He reports that he does not drink alcohol and does not use drugs. ? ?ROS: ?Pertinent ROS in HPI ? ?Physical Exam: ?BP (!) 92/58   Pulse 82   ?Constitutional:  Well nourished. Alert and oriented, No acute distress. ?HEENT: [place] AT, mask in place.  Trachea midline ?Cardiovascular: No clubbing, cyanosis, or edema. ?Respiratory: Normal respiratory effort, no increased work of breathing. ?GU: No CVA tenderness.  No bladder fullness or masses.  Patient with uncircumcised phallus. Foreskin easily retracted.  Yeasty drainage seen in depends.  Urethral meatus is patent.  No penile discharge. No penile lesions or rashes. Scrotum with mild erythema with thickening of the scrotal skin and induration.  It is tender to patient with palpation.  No crepitus or distinct fluctuant mass noted.   ?Neurologic: Grossly intact,  no focal deficits, moving all 4 extremities. ?Psychiatric: Normal mood and affect.  ? ?Laboratory Data: ?N/A ? ? ?Pertinent Imaging: ?N/A   ? ?Assessment & Plan:   ? ?1.  Left epididymitis ?-Scrotum is likely containing pockets of phlegmon and will need to be addressed in the operating room for I&D and scrotal exploration ?-There is not a distinct area of fluctuation that one can I&D here in the office, but it is possible that it will drain spontaneously again in the future ?-We will restart the patient's Diflucan and Augmentin 875/125, BID x 7 days  ?-I have spoken with his legal guardian, Bretta Bang, about proceeding with the scrotal expiration I&D in the OR how the procedures performed, the risks involved and aftercare for the wound and would like [name] to proceed with the procedure ?-Red flags reviewed with patient's staff (fevers, chills, nausea/vomiting, increased scrotal pain, discoloration of scrotal skin and/or increased scrotal swelling) ? ?Return for I & D of  scrotum in the OR . ? ?These notes generated with voice recognition software. I apologize for typographical errors. ? ?Amita Atayde, PA-C ? ?[place] ?[place]

## 2021-06-28 NOTE — Telephone Encounter (Signed)
After they cancelled his appointment today, I spoke with [name] at [name] and explained that it sounds like [name] is starting to have another scrotal abscess and that it will need to be drained.  If they are not able to bring him to the office this afternoon, they need to bring him to the emergency room so that it can be addressed this evening. ?

## 2021-06-28 NOTE — Progress Notes (Signed)
Surgical Physician Order Form [facility] ? ?* Scheduling expectation :  Tuesday with Dr. Bernardo Heater ? ?*Length of Case:  ? ?*Clearance needed: no ? ?*Anticoagulation Instructions: Hold all anticoagulants ? ?*Aspirin Instructions: Hold Aspirin ? ?*Post-op visit Date/Instructions:   TBD ? ?*Diagnosis:  left scrotal abscess ? ?*Procedure: left  scrotal exploration and I & D of abscess ? ? ?Additional orders: N/A ? ?-Admit type: OUTpatient, but may stay overnight  ? ?-Anesthesia: General ? ?-VTE Prophylaxis Standing Order SCD?s    ?   ?Other:  ? ?-Standing Lab Orders Per Anesthesia   ? ?Lab other: None ? ?-Standing Test orders EKG/Chest x-ray per Anesthesia      ? ?Test other:  ? ?- Medications:  Ancef 2gm IV ? ?-Other orders:  N/A ? ? ? ?   ?

## 2021-06-29 NOTE — Progress Notes (Signed)
[facility] Posting Form  ? ?Surgery Date/Time: Date: 07/05/2021 ? ?Surgeon: Dr. John Giovanni, MD ? ?Surgery Location: Day Surgery ? ?Inpt ( No  )   Outpt (Yes)   Obs ( No  )  ? ?Diagnosis: Left Scrotal Abscess N42.9 ? ?-CPT: 55100, 55110  ? ?Surgery: Left Scrotal Exploration with Incision and Drainage of Abscess ? ?Stop Anticoagulations: Yes and hold ASA ? ?Cardiac/Medical/Pulmonary Clearance needed: no ? ?*Orders entered into EPIC  Date: 06/29/21  ? ?*Case booked in [place]  Date: 06/29/21 ? ?*Notified pt of Surgery: Date: 06/29/21 ? ?PRE-OP UA & CX: no ? ?*Placed into Prior Authorization Work Que Date: 06/29/21 ? ? ?Assistant/laser/rep:No ? ? ? ? ? ? ? ? ? ? ? ? ? ? ? ?

## 2021-06-30 ENCOUNTER — Ambulatory Visit: Payer: Medicare Other | Admitting: Urology

## 2021-07-01 ENCOUNTER — Other Ambulatory Visit: Payer: Self-pay

## 2021-07-01 ENCOUNTER — Other Ambulatory Visit
Admission: RE | Admit: 2021-07-01 | Discharge: 2021-07-01 | Disposition: A | Discharge status: Home / Self Care | Payer: Medicare Other | Source: Ambulatory Visit | Attending: Urology | Admitting: Urology

## 2021-07-01 NOTE — Patient Instructions (Addendum)
Your procedure is scheduled on: 07/05/21 - Tuesday ?Report to the Registration Desk on the 1st floor of the [facility]. ?To find out your arrival time, please call [phone] between 1PM - 3PM on: 07/04/21 - Monday ? ?REMEMBER: ?Instructions that are not followed completely may result in serious medical risk, up to and including death; or upon the discretion of your surgeon and anesthesiologist your surgery may need to be rescheduled. ? ?Do not eat food or drink any fluids after midnight the night before surgery.  ?No gum chewing, lozengers or hard candies. ? ?TAKE THESE MEDICATIONS THE MORNING OF SURGERY WITH A SIP OF WATER:  ? ?- alfuzosin (UROXATRAL) 10 MG 24 hr tablet ?- ALPRAZolam (XANAX) 0.25 MG tablet if needed ?- lubiprostone (AMITIZA) 24 MCG capsule ?- sertraline (ZOLOFT) 100 MG tablet ? ?One week prior to surgery: ?Stop Anti-inflammatories (NSAIDS) such as Advil, Aleve, Ibuprofen, Motrin, Naproxen, Naprosyn and Aspirin based products such as Excedrin, Goodys Powder, BC Powder. ? ?Stop ANY OVER THE COUNTER supplements until after surgery. ? ?You may however, continue to take Tylenol if needed for pain up until the day of surgery. ? ?No Alcohol for 24 hours before or after surgery. ? ?No Smoking including e-cigarettes for 24 hours prior to surgery.  ?No chewable tobacco products for at least 6 hours prior to surgery.  ?No nicotine patches on the day of surgery. ? ?Do not use any "recreational" drugs for at least a week prior to your surgery.  ?Please be advised that the combination of cocaine and anesthesia may have negative outcomes, up to and including death. ?If you test positive for cocaine, your surgery will be cancelled. ? ?On the morning of surgery brush your teeth with toothpaste and water, you may rinse your mouth with mouthwash if you wish. ?Do not swallow any toothpaste or mouthwash. ? ?Do not wear jewelry, make-up, hairpins, clips or nail polish. ? ?Do not wear lotions, powders, or  perfumes.  ? ?Do not shave body from the neck down 48 hours prior to surgery just in case you cut yourself which could leave a site for infection.  ?Also, freshly shaved skin may become irritated if using the CHG soap. ? ?Contact lenses, hearing aids and dentures may not be worn into surgery. ? ?Do not bring valuables to the hospital. [facility] is not responsible for any missing/lost belongings or valuables.  ? ?Notify your doctor if there is any change in your medical condition (cold, fever, infection). ? ?Wear comfortable clothing (specific to your surgery type) to the hospital. ? ?After surgery, you can help prevent lung complications by doing breathing exercises.  ?Take deep breaths and cough every 1-2 hours. Your doctor may order a device called an Incentive Spirometer to help you take deep breaths. ?When coughing or sneezing, hold a pillow firmly against your incision with both hands. This is called ?splinting.? Doing this helps protect your incision. It also decreases belly discomfort. ? ?If you are being admitted to the hospital overnight, leave your suitcase in the car. ?After surgery it may be brought to your room. ? ?If you are being discharged the day of surgery, you will not be allowed to drive home. ?You will need a responsible adult (18 years or older) to drive you home and stay with you that night.  ? ?If you are taking public transportation, you will need to have a responsible adult (18 years or older) with you. ?Please confirm with your physician that it is acceptable to  use public transportation.  ? ?Please call the [facility] at [phone] if you have any questions about these instructions. ? ?Surgery Visitation Policy: ? ?Patients undergoing a surgery or procedure may have one family member or support person with them as long as that person is not COVID-19 positive or experiencing its symptoms.  ?That person may remain in the waiting area during the procedure and may  rotate out with other people. ? ?Inpatient Visitation:   ? ?Visiting hours are 7 a.m. to 8 p.m. ?Up to two visitors ages 16+ are allowed at one time in a patient room. The visitors may rotate out with other people during the day. Visitors must check out when they leave, or other visitors will not be allowed. One designated support person may remain overnight. ?The visitor must pass COVID-19 screenings, use hand sanitizer when entering and exiting the patient?s room and wear a mask at all times, including in the patient?s room. ?Patients must also wear a mask when staff or their visitor are in the room. ?Masking is required regardless of vaccination status.  ?

## 2021-07-04 MED ORDER — CEFAZOLIN SODIUM-DEXTROSE 2-4 GM/100ML-% IV SOLN
2.0000 g | INTRAVENOUS | Status: AC
  Administered 2021-07-05: 2 g via INTRAVENOUS

## 2021-07-04 MED ORDER — FAMOTIDINE 20 MG PO TABS
20.0000 mg | ORAL_TABLET | Freq: Once | ORAL | Status: AC
  Administered 2021-07-05: 20 mg via ORAL

## 2021-07-04 MED ORDER — ORAL CARE MOUTH RINSE
15.0000 mL | Freq: Once | OROMUCOSAL | Status: AC
Start: 2021-07-04 — End: 2021-07-05

## 2021-07-04 MED ORDER — LACTATED RINGERS IV SOLN: INTRAVENOUS | Status: DC

## 2021-07-04 MED ORDER — CHLORHEXIDINE GLUCONATE 0.12 % MT SOLN
15.0000 mL | Freq: Once | OROMUCOSAL | Status: AC
  Administered 2021-07-05: 15 mL via OROMUCOSAL

## 2021-07-04 NOTE — Pre-Procedure Instructions (Addendum)
Pre admission appointment completed with  ?[id number] Ordinator at [facility] , medications reviewed .[name] is patient's legal guardian legal - paperwork is in patients chart, consent for surgery was done over the phone with legal guardian and 2 RN's, copy of pre admission instructions was secured copied to legal guardian. SW - Ken at [name] was made aware of surgery and a secure copy of insrtuctions was sent to [facility] by this Probation officer. ?

## 2021-07-05 ENCOUNTER — Encounter: Payer: Self-pay | Admitting: Urology

## 2021-07-05 ENCOUNTER — Ambulatory Visit: Payer: Medicare Other | Admitting: Certified Registered Nurse Anesthetist

## 2021-07-05 ENCOUNTER — Telehealth: Payer: Self-pay | Admitting: Urology

## 2021-07-05 ENCOUNTER — Encounter
Admission: RE | Disposition: A | Discharge status: Home / Self Care | Payer: Self-pay | Source: Home / Self Care | Attending: Urology

## 2021-07-05 ENCOUNTER — Other Ambulatory Visit: Payer: Self-pay

## 2021-07-05 ENCOUNTER — Observation Stay
Admission: RE | Admit: 2021-07-05 | Discharge: 2021-07-06 | Disposition: A | Discharge status: Home / Self Care | Payer: Medicare Other | Attending: Urology | Admitting: Urology

## 2021-07-05 DIAGNOSIS — N492 Inflammatory disorders of scrotum: Secondary | ICD-10-CM | POA: Diagnosis not present

## 2021-07-05 DIAGNOSIS — Z8546 Personal history of malignant neoplasm of prostate: Secondary | ICD-10-CM | POA: Insufficient documentation

## 2021-07-05 DIAGNOSIS — N5089 Other specified disorders of the male genital organs: Secondary | ICD-10-CM | POA: Diagnosis present

## 2021-07-05 LAB — CBC
HCT: 37.8 % — ABNORMAL LOW (ref 39.0–52.0)
Hemoglobin: 12.7 g/dL — ABNORMAL LOW (ref 13.0–17.0)
MCH: 31.8 pg (ref 26.0–34.0)
MCHC: 33.6 g/dL (ref 30.0–36.0)
MCV: 94.5 fL (ref 80.0–100.0)
Platelets: 137 10*3/uL — ABNORMAL LOW (ref 150–400)
RBC: 4 MIL/uL — ABNORMAL LOW (ref 4.22–5.81)
RDW: 13.3 % (ref 11.5–15.5)
WBC: 4 10*3/uL (ref 4.0–10.5)
nRBC: 0 % (ref 0.0–0.2)

## 2021-07-05 LAB — CREATININE, SERUM
Creatinine, Ser: 0.6 mg/dL — ABNORMAL LOW (ref 0.61–1.24)
GFR, Estimated: >60 mL/min (ref >60)

## 2021-07-05 SURGERY — EXPLORATION, SCROTUM
Anesthesia: General | Laterality: Left

## 2021-07-05 MED ORDER — FENTANYL CITRATE (PF) 100 MCG/2ML IJ SOLN
INTRAMUSCULAR | Status: DC | PRN
  Administered 2021-07-05: 25 ug via INTRAVENOUS

## 2021-07-05 MED ORDER — CHLORHEXIDINE GLUCONATE 0.12 % MT SOLN
OROMUCOSAL | Status: AC
  Filled 2021-07-05: qty 15

## 2021-07-05 MED ORDER — ATORVASTATIN CALCIUM 20 MG PO TABS: 40.0000 mg | ORAL_TABLET | Freq: Every evening | ORAL | Status: DC

## 2021-07-05 MED ORDER — BUPIVACAINE HCL (PF) 0.5 % IJ SOLN
INTRAMUSCULAR | Status: AC
  Filled 2021-07-05: qty 30

## 2021-07-05 MED ORDER — ENSURE ENLIVE PO LIQD
1.0000 | Freq: Three times a day (TID) | ORAL | Status: DC
  Administered 2021-07-06 (×2): 237 mL via ORAL

## 2021-07-05 MED ORDER — ACETAMINOPHEN 10 MG/ML IV SOLN: 1000.0000 mg | Freq: Once | INTRAVENOUS | Status: DC | PRN

## 2021-07-05 MED ORDER — ONDANSETRON HCL 4 MG/2ML IJ SOLN: 4.0000 mg | INTRAMUSCULAR | Status: DC | PRN

## 2021-07-05 MED ORDER — ONDANSETRON HCL 4 MG/2ML IJ SOLN
INTRAMUSCULAR | Status: DC | PRN
  Administered 2021-07-05: 4 mg via INTRAVENOUS

## 2021-07-05 MED ORDER — CEFAZOLIN SODIUM-DEXTROSE 2-4 GM/100ML-% IV SOLN
INTRAVENOUS | Status: AC
  Filled 2021-07-05: qty 100

## 2021-07-05 MED ORDER — DEXAMETHASONE SODIUM PHOSPHATE 10 MG/ML IJ SOLN
INTRAMUSCULAR | Status: DC | PRN
  Administered 2021-07-05: 10 mg via INTRAVENOUS

## 2021-07-05 MED ORDER — DEPEND SILHOUETTE BRIEFS L/XL MISC: 1.0000 | Freq: Two times a day (BID) | Status: DC | PRN

## 2021-07-05 MED ORDER — LIDOCAINE HCL (PF) 1 % IJ SOLN
INTRAMUSCULAR | Status: AC
  Filled 2021-07-05: qty 30

## 2021-07-05 MED ORDER — PROPOFOL 10 MG/ML IV BOLUS
INTRAVENOUS | Status: DC | PRN
  Administered 2021-07-05: 100 mg via INTRAVENOUS

## 2021-07-05 MED ORDER — POLYVINYL ALCOHOL 1.4 % OP SOLN
2.0000 [drp] | Freq: Two times a day (BID) | OPHTHALMIC | Status: DC | PRN
  Filled 2021-07-05: qty 15

## 2021-07-05 MED ORDER — FENTANYL CITRATE (PF) 100 MCG/2ML IJ SOLN
INTRAMUSCULAR | Status: AC
Start: 2021-07-05 — End: ?
  Filled 2021-07-05: qty 2

## 2021-07-05 MED ORDER — ALPRAZOLAM 0.25 MG PO TABS: 0.2500 mg | ORAL_TABLET | Freq: Every day | ORAL | Status: DC | PRN

## 2021-07-05 MED ORDER — ALFUZOSIN HCL ER 10 MG PO TB24
10.0000 mg | ORAL_TABLET | Freq: Every day | ORAL | Status: DC
Start: 2021-07-06 — End: 2021-07-06
  Administered 2021-07-06: 10 mg via ORAL
  Filled 2021-07-05: qty 1

## 2021-07-05 MED ORDER — OYSTER SHELL CALCIUM/D3 500-5 MG-MCG PO TABS
1.0000 | ORAL_TABLET | Freq: Two times a day (BID) | ORAL | Status: DC
  Administered 2021-07-05 – 2021-07-06 (×2): 1 via ORAL
  Filled 2021-07-05 (×2): qty 1

## 2021-07-05 MED ORDER — HYDROCODONE-ACETAMINOPHEN 5-325 MG PO TABS: 1.0000 | ORAL_TABLET | Freq: Four times a day (QID) | ORAL | Status: DC | PRN

## 2021-07-05 MED ORDER — EPHEDRINE SULFATE (PRESSORS) 50 MG/ML IJ SOLN
INTRAMUSCULAR | Status: DC | PRN
  Administered 2021-07-05 (×2): 10 mg via INTRAVENOUS

## 2021-07-05 MED ORDER — AMOXICILLIN-POT CLAVULANATE 875-125 MG PO TABS
1.0000 | ORAL_TABLET | Freq: Two times a day (BID) | ORAL | Status: DC
  Administered 2021-07-05 – 2021-07-06 (×2): 1 via ORAL
  Filled 2021-07-05 (×2): qty 1

## 2021-07-05 MED ORDER — LUBIPROSTONE 24 MCG PO CAPS
24.0000 ug | ORAL_CAPSULE | Freq: Two times a day (BID) | ORAL | Status: DC
  Administered 2021-07-05 – 2021-07-06 (×2): 24 ug via ORAL
  Filled 2021-07-05 (×3): qty 1

## 2021-07-05 MED ORDER — LIDOCAINE HCL (CARDIAC) PF 100 MG/5ML IV SOSY
PREFILLED_SYRINGE | INTRAVENOUS | Status: DC | PRN
  Administered 2021-07-05: 100 mg via INTRAVENOUS

## 2021-07-05 MED ORDER — PROPOFOL 10 MG/ML IV BOLUS
INTRAVENOUS | Status: AC
  Filled 2021-07-05: qty 20

## 2021-07-05 MED ORDER — FLUCONAZOLE 100 MG PO TABS
100.0000 mg | ORAL_TABLET | Freq: Every day | ORAL | Status: DC
  Administered 2021-07-05 – 2021-07-06 (×2): 100 mg via ORAL
  Filled 2021-07-05 (×2): qty 1

## 2021-07-05 MED ORDER — LACTATED RINGERS IV SOLN: INTRAVENOUS | Status: DC

## 2021-07-05 MED ORDER — FAMOTIDINE 20 MG PO TABS
ORAL_TABLET | ORAL | Status: AC
  Filled 2021-07-05: qty 1

## 2021-07-05 MED ORDER — LIDOCAINE HCL (PF) 2 % IJ SOLN
INTRAMUSCULAR | Status: AC
  Filled 2021-07-05: qty 5

## 2021-07-05 MED ORDER — FENTANYL CITRATE (PF) 100 MCG/2ML IJ SOLN: 25.0000 ug | INTRAMUSCULAR | Status: DC | PRN

## 2021-07-05 MED ORDER — SERTRALINE HCL 50 MG PO TABS
100.0000 mg | ORAL_TABLET | Freq: Every day | ORAL | Status: DC
  Administered 2021-07-05 – 2021-07-06 (×2): 100 mg via ORAL
  Filled 2021-07-05 (×2): qty 2

## 2021-07-05 MED ORDER — ONDANSETRON HCL 4 MG/2ML IJ SOLN: 4.0000 mg | Freq: Once | INTRAMUSCULAR | Status: DC | PRN

## 2021-07-05 MED ORDER — ENOXAPARIN SODIUM 40 MG/0.4ML IJ SOSY
40.0000 mg | PREFILLED_SYRINGE | INTRAMUSCULAR | Status: DC
  Administered 2021-07-06: 40 mg via SUBCUTANEOUS
  Filled 2021-07-05: qty 0.4

## 2021-07-05 SURGICAL SUPPLY — 36 items
BNDG CONFORM 2 STRL LF (GAUZE/BANDAGES/DRESSINGS) ×2 IMPLANT
CHLORAPREP W/TINT 26 (MISCELLANEOUS) ×2 IMPLANT
DERMABOND ADVANCED (GAUZE/BANDAGES/DRESSINGS) ×1
DERMABOND ADVANCED .7 DNX12 (GAUZE/BANDAGES/DRESSINGS) ×1 IMPLANT
DRAIN PENROSE 12X.25 LTX STRL (MISCELLANEOUS) ×2 IMPLANT
DRAPE LAPAROTOMY 77X122 PED (DRAPES) ×2 IMPLANT
DRSG GAUZE FLUFF 36X18 (GAUZE/BANDAGES/DRESSINGS) ×2 IMPLANT
ELECT REM PT RETURN 9FT ADLT (ELECTROSURGICAL) ×2
ELECTRODE REM PT RTRN 9FT ADLT (ELECTROSURGICAL) ×1 IMPLANT
GAUZE 4X4 16PLY ~~LOC~~+RFID DBL (SPONGE) ×2 IMPLANT
GAUZE PACKING 1/4 X5 YD (GAUZE/BANDAGES/DRESSINGS) ×1 IMPLANT
GAUZE SPONGE 4X4 12PLY STRL (GAUZE/BANDAGES/DRESSINGS) ×2 IMPLANT
GLOVE SURG UNDER POLY LF SZ7.5 (GLOVE) ×2 IMPLANT
GOWN STRL REUS W/ TWL LRG LVL3 (GOWN DISPOSABLE) ×2 IMPLANT
GOWN STRL REUS W/ TWL XL LVL3 (GOWN DISPOSABLE) ×1 IMPLANT
GOWN STRL REUS W/TWL LRG LVL3 (GOWN DISPOSABLE) ×2
GOWN STRL REUS W/TWL XL LVL3 (GOWN DISPOSABLE) ×1
KIT TURNOVER KIT A (KITS) ×2 IMPLANT
LABEL OR SOLS (LABEL) ×2 IMPLANT
MANIFOLD NEPTUNE II (INSTRUMENTS) ×2 IMPLANT
NDL HYPO 25X1 1.5 SAFETY (NEEDLE) ×1 IMPLANT
NEEDLE HYPO 25X1 1.5 SAFETY (NEEDLE) ×2 IMPLANT
NS IRRIG 500ML POUR BTL (IV SOLUTION) ×2 IMPLANT
PACK BASIN MINOR ARMC (MISCELLANEOUS) ×2 IMPLANT
SOL PREP PVP 2OZ (MISCELLANEOUS) ×2
SOLUTION PREP PVP 2OZ (MISCELLANEOUS) ×1 IMPLANT
SUPPORETR ATHLETIC LG (MISCELLANEOUS) ×1 IMPLANT
SUPPORTER ATHLETIC LG (MISCELLANEOUS) ×4
SUT ETHILON 3-0 FS-10 30 BLK (SUTURE) ×2
SUT VIC AB 3-0 SH 27 (SUTURE) ×2
SUT VIC AB 3-0 SH 27X BRD (SUTURE) ×2 IMPLANT
SUT VIC AB 4-0 SH 27 (SUTURE) ×1
SUT VIC AB 4-0 SH 27XANBCTRL (SUTURE) ×1 IMPLANT
SUTURE EHLN 3-0 FS-10 30 BLK (SUTURE) ×1 IMPLANT
SYR 10ML LL (SYRINGE) ×2 IMPLANT
WATER STERILE IRR 500ML POUR (IV SOLUTION) ×2 IMPLANT

## 2021-07-05 NOTE — Interval H&P Note (Signed)
History and Physical Interval Note: ? ?Caregiver notes no significant drainage since last week's visit.  ? ?Exam: Indurated area left hemiscrotum measuring ~ 3 cm with small sinus. ?CV: Clear ?Lungs: Clear ? ?07/05/2021 ?2:34 PM ? ?[name]  has presented today for surgery, with the diagnosis of Left Scrotal Abscess.  The various methods of treatment have been discussed with the patient and family. After consideration of risks, benefits and other options for treatment, the patient has consented to  Procedure(s): ?SCROTUM EXPLORATION (Left) ?INCISION AND DRAINAGE SCROTAL WALL ABSCESS (Left) as a surgical intervention.  [name] history has been reviewed, patient examined, no change in status, stable for surgery.  I have reviewed the patient's chart and labs.  Questions were answered to the patient's satisfaction.   ? ? ?Evian Salguero C Guilherme Schwenke ? ? ?

## 2021-07-05 NOTE — Telephone Encounter (Signed)
I left a message with [name] to call me back in regards to meeting sometime at the hospital to show her how to change bills scrotal dressings. ?

## 2021-07-05 NOTE — Transfer of Care (Signed)
Immediate Anesthesia Transfer of Care Note ? ?Patient: [name] ? ?Procedure(s) Performed: SCROTUM EXPLORATION (Left) ?INCISION AND DRAINAGE SCROTAL WALL ABSCESS (Left) ? ?Patient Location: PACU ? ?Anesthesia Type:General ? ?Level of Consciousness: drowsy ? ?Airway & Oxygen Therapy: Patient Spontanous Breathing and Patient connected to face mask oxygen ? ?Post-op Assessment: Report given to RN and Post -op Vital signs reviewed and stable ? ?Post vital signs: Reviewed and stable ? ?Last Vitals:  ?Vitals Value Taken Time  ?BP 123/80 07/05/21 1533  ?Temp    ?Pulse 63 07/05/21 1537  ?Resp 17 07/05/21 1537  ?SpO2 97 % 07/05/21 1537  ?Vitals shown include unvalidated device data. ? ?Last Pain:  ?Vitals:  ? 07/05/21 1230  ?TempSrc: Temporal  ?PainSc: 0-No pain  ?   ? ?  ? ?Complications: No notable events documented. ?

## 2021-07-05 NOTE — Op Note (Signed)
Preoperative diagnosis:  ?Recurrent scrotal abscess  ? ?Postoperative diagnosis:  ?Same ? ?Procedure: ?Left hemiscrotal exploration ? ?Surgeon: Abbie Sons, MD ? ?Anesthesia: General ? ?Complications: None ? ?Intraoperative findings:  ?3-4 cm indurated mass left hemiscrotum with small sinus tract without drainage.  No evidence of abscess ? ?EBL: Minimal ? ?Specimens: None ? ?Indication: [name] is a 81 y.o. male with cerebral palsy, history of prostate cancer, chronic bacteriuria and recurrent left scrotal abscess.  Seen in the ED early February with a large 8 cm scrotal wall abscess which was spontaneously draining.  He has had intermittent swelling with drainage.  He was seen by Zara Council last week and noted to have tenderness and moderate swelling.  His caregiver states the swelling has decreased and he has had no drainage over the last 5 days.  After reviewing the management options for treatment, he elected to proceed with the above surgical procedure(s). We have discussed the potential benefits and risks of the procedure, side effects of the proposed treatment, the likelihood of the patient achieving the goals of the procedure, and any potential problems that might occur during the procedure or recuperation. Informed consent has been obtained. ? ?Description of procedure: ? ?The patient was taken to the operating room and general anesthesia was induced.  The patient was placed in the supine position, prepped and draped in the usual sterile fashion, and preoperative antibiotics were administered. A preoperative time-out was performed.  ? ?Examination under anesthesia was performed.  There was an indurated mass left hemiscrotum with a nondraining sinus tract which measured ~ 4 cm.  ? ?The sinus tract was explored with a lacrimal duct probe and ended blindly after 1 cm and no drainage was obtained. ? ?A 2 cm transverse incision was made overlying the sinus tract.  There was indurated tissue  for a depth of 1 cm.  Once through the indurated tissue a finger was inserted into decision.  Soft tissue was palpable but no abscess cavity or fluid was obtained. ? ?Based on these findings it was elected not to proceed further as the patient had significantly improved clinically since his visit last week.  Hemostasis was obtained with electrocautery.  A 1/4 inch packing was placed to keep the wound edges separated.  A dressing of fluffs was applied. ? ?After anesthetic reversal he was transported to the PACU in stable condition. ? ?Plan: ?Due to limited nighttime.  He will be placed in observation ?His caregiver will be taught dressing changes in a.m. ? ? ?Abbie Sons, M.D. ? ?

## 2021-07-05 NOTE — Anesthesia Procedure Notes (Signed)
Procedure Name: LMA Insertion ?Date/Time: 07/05/2021 2:53 PM ?Performed by: Beverely Low, CRNA ?Pre-anesthesia Checklist: Patient identified, Patient being monitored, Timeout performed, Emergency Drugs available and Suction available ?Patient Re-evaluated:Patient Re-evaluated prior to induction ?Oxygen Delivery Method: Circle system utilized ?Preoxygenation: Pre-oxygenation with 100% oxygen ?Induction Type: IV induction ?Ventilation: Mask ventilation without difficulty ?LMA: LMA inserted ?LMA Size: 4.5 ?Tube type: Oral ?Number of attempts: 1 ?Placement Confirmation: positive ETCO2 and breath sounds checked- equal and bilateral ?Tube secured with: Tape ?Dental Injury: Teeth and Oropharynx as per pre-operative assessment  ? ? ? ? ?

## 2021-07-05 NOTE — Interval H&P Note (Signed)
History and Physical Interval Note: ? ?07/05/2021 ?2:33 PM ? ?[name]  has presented today for surgery, with the diagnosis of Left Scrotal Abscess.  The various methods of treatment have been discussed with the patient and family. After consideration of risks, benefits and other options for treatment, the patient has consented to  Procedure(s): ?SCROTUM EXPLORATION (Left) ?INCISION AND DRAINAGE SCROTAL WALL ABSCESS (Left) as a surgical intervention.  [name] history has been reviewed, patient examined, no change in status, stable for surgery.  I have reviewed the patient's chart and labs.  Questions were answered to the patient's satisfaction.   ? ? ?Irie Dowson C Latoy Labriola ? ? ?

## 2021-07-05 NOTE — Anesthesia Preprocedure Evaluation (Signed)
Anesthesia Evaluation  ?Patient identified by MRN, date of birth, ID band ?Patient confused ? ? ? ?Reviewed: ?Allergy & Precautions, H&P , NPO status , Patient's Chart, lab work & pertinent test results, reviewed documented beta blocker date and time  ? ?History of Anesthesia Complications ?Negative for: history of anesthetic complications ? ?Airway ?Mallampati: III ? ?TM Distance: >3 FB ?Neck ROM: full ? ? ? Dental ? ?(+) Dental Advidsory Given, Edentulous Upper, Edentulous Lower ?  ?Pulmonary ?pneumonia, resolved,  ?  ?Pulmonary exam normal ? ? ? ? ? ? ? Cardiovascular ?Exercise Tolerance: Poor ?hypertension, Pt. on medications ?(-) angina+ CAD and + Peripheral Vascular Disease  ?(-) Past MI, (-) Cardiac Stents and (-) CABG Normal cardiovascular exam(-) dysrhythmias (-) Valvular Problems/Murmurs ? ? ?  ?Neuro/Psych ?neg Seizures PSYCHIATRIC DISORDERS Anxiety Depression Dementia Cerebral palsy and mental retardation ? Neuromuscular disease   ? GI/Hepatic ?negative GI ROS, Neg liver ROS,   ?Endo/Other  ?negative endocrine ROS ? Renal/GU ?Renal disease (kidney stones and cysts)  ?negative genitourinary ?  ?Musculoskeletal ? ? Abdominal ?Normal abdominal exam  (+)   ?Peds ? Hematology ?negative hematology ROS ?(+)   ?Anesthesia Other Findings ?Past Medical History: ?No date: Anxiety ?No date: Atrophy, disuse, muscle ?No date: BPH (benign prostatic hyperplasia) ?No date: Cerebral palsy ([place]) ?No date: Eczema ?No date: Elevated lipids ?No date: Elevated PSA ?No date: Exotropia ?No date: Hematuria, gross ?No date: Hyperlipidemia ?No date: Incontinence ?No date: Kidney stone ?No date: Lower extremity edema ?    Comment:  lymph drainage problems ?No date: Lower urinary tract symptoms ?No date: Lymphedema ?No date: Mental retardation ?No date: Over weight ?No date: Prostate cancer [facility]) ?No date: Urinary frequency ?No date: Urinary urgency ?No date: UTI (urinary tract infection) ?No date:  Venous insufficiency ?No date: Vitamin D deficiency ?No date: Wheelchair dependence ? ? Reproductive/Obstetrics ?negative OB ROS ? ?  ? ? ? ? ? ? ? ? ? ? ? ? ? ?  ?  ? ? ? ? ? ? ? ?Anesthesia Physical ? ?Anesthesia Plan ? ?ASA: 3 ? ?Anesthesia Plan: General  ? ?Post-op Pain Management: Ofirmev IV (intra-op)*  ? ?Induction: Intravenous ? ?PONV Risk Score and Plan: 3 and Ondansetron, Dexamethasone and Treatment may vary due to age or medical condition ? ?Airway Management Planned: LMA ? ?Additional Equipment: None ? ?Intra-op Plan:  ? ?Post-operative Plan: Extubation in OR ? ?Informed Consent: I have reviewed the patients History and Physical, chart, labs and discussed the procedure including the risks, benefits and alternatives for the proposed anesthesia with the patient or authorized representative who has indicated his/her understanding and acceptance.  ? ? ? ?Dental Advisory Given and Consent reviewed with POA ? ?Plan Discussed with: Anesthesiologist, CRNA and Surgeon ? ?Anesthesia Plan Comments: (Discussed risks of anesthesia with patient's legal guardian Anselm Jungling by phone, including PONV, sore throat, lip/dental/eye damage. Rare risks discussed as well, such as cardiorespiratory and neurological sequelae, and allergic reactions. Discussed the role of CRNA in patient's perioperative care. She understands.)  ? ? ? ? ? ?Anesthesia Quick Evaluation ? ?

## 2021-07-06 ENCOUNTER — Other Ambulatory Visit: Payer: Self-pay | Admitting: Urology

## 2021-07-06 DIAGNOSIS — N451 Epididymitis: Secondary | ICD-10-CM

## 2021-07-06 DIAGNOSIS — N492 Inflammatory disorders of scrotum: Secondary | ICD-10-CM | POA: Diagnosis not present

## 2021-07-06 MED ORDER — HYDROCODONE-ACETAMINOPHEN 5-325 MG PO TABS: 1.0000 | ORAL_TABLET | Freq: Four times a day (QID) | ORAL | 0 refills | Status: DC | PRN

## 2021-07-06 MED ORDER — ORAL CARE MOUTH RINSE
15.0000 mL | Freq: Two times a day (BID) | OROMUCOSAL | Status: DC
  Administered 2021-07-06: 15 mL via OROMUCOSAL

## 2021-07-06 MED ORDER — AMOXICILLIN-POT CLAVULANATE 875-125 MG PO TABS: 1.0000 | ORAL_TABLET | Freq: Two times a day (BID) | ORAL | 0 refills | Status: DC

## 2021-07-06 MED ORDER — CHLORHEXIDINE GLUCONATE CLOTH 2 % EX PADS: 6.0000 | MEDICATED_PAD | Freq: Every day | CUTANEOUS | Status: DC

## 2021-07-06 NOTE — Progress Notes (Signed)
Report was called and given to Butch Penny. All questions answered. Patient to be discharged.  ?

## 2021-07-06 NOTE — Plan of Care (Signed)
  Problem: Clinical Measurements: Goal: Postoperative complications will be avoided or minimized Outcome: Progressing   

## 2021-07-06 NOTE — [place] FL2 (Signed)
?[place] MEDICAID FL2 LEVEL OF CARE SCREENING TOOL  ?  ? ?IDENTIFICATION  ?Patient Name: ?[name] Birthdate: Sep 14, 1940 Sex: male Admission Date (Current Location): ?07/05/2021  ?[place] and [place] Number: ? [place] ?  Facility and Address:  ?  ?     Provider Number: ?[id number]  ?Attending Physician Name and Address:  ?Abbie Sons, MD ? Relative Name and Phone Number:  ?  ?   ?Current Level of Care: ?Hospital Recommended Level of Care: ?Family Care Home Prior Approval Number: ?  ? ?Date Approved/Denied: ?  PASRR Number: ?  ? ?Discharge Plan: ?Other (Comment) (Family care home/group home) ?  ? ?Current Diagnoses: ?Patient Active Problem List  ? Diagnosis Date Noted  ? Scrotal swelling 07/05/2021  ? Urinary incontinence 05/26/2021  ? Hypotension 05/20/2021  ? Rectal bleeding 05/09/2020  ? Hematochezia   ? HCAP (healthcare-associated pneumonia) 05/07/2020  ? BPH (benign prostatic hyperplasia)   ? Hyperlipidemia   ? Depression with anxiety   ? Protein-calorie malnutrition, severe 04/22/2020  ? Pressure injury of skin 04/22/2020  ? Multifocal pneumonia   ? Sepsis due to undetermined organism ([place]) 04/19/2020  ? Personal history of urinary calculi 06/21/2019  ? PAD (peripheral artery disease) ([place]) 07/04/2017  ? Aortic valve calcification 07/04/2017  ? Coronary artery calcification 07/04/2017  ? Late effect acute polio 05/17/2016  ? Chronic constipation 11/19/2015  ? Mild major depression ([place]) 11/19/2015  ? Bacteriuria, chronic 09/11/2015  ? History of prostate cancer 03/02/2015  ? Amyotrophia 03/02/2015  ? Edema leg 03/02/2015  ? Cerebral palsy ([place]) 03/02/2015  ? Dyslipidemia 03/02/2015  ? Dermatitis, eczematoid 03/02/2015  ? Divergent squint 03/02/2015  ? H/O acute poliomyelitis 03/02/2015  ? Lymphedema 03/02/2015  ? Intellectual disability 03/02/2015  ? Hypertrophy of nail 03/02/2015  ? Chronic venous insufficiency 03/02/2015  ? Avitaminosis D 03/02/2015  ? Adynamia 03/02/2015  ? Dependent on  wheelchair 03/02/2015  ? Renal cysts, acquired, bilateral 03/02/2015  ? At risk for falling 03/02/2015  ? Cerebral vascular disease 03/02/2015  ? Strabismus 02/11/2015  ? Wheelchair dependence 02/11/2015  ? ? ?Orientation RESPIRATION BLADDER Height & Weight   ?  ?Self ? Normal Indwelling catheter Weight: 61.7 kg ?Height:  '5\' 10"'$  (177.8 cm)  ?BEHAVIORAL SYMPTOMS/MOOD NEUROLOGICAL BOWEL NUTRITION STATUS  ?    Continent Diet (dys 1, thin liquid)  ?AMBULATORY STATUS COMMUNICATION OF NEEDS Skin   ? (WC bound, hoyer lift) Verbally Surgical wounds ?  ?  ?  ?    ?     ?     ? ? ?Personal Care Assistance Level of Assistance  ?Total care   ?  ?  ?   ? ?Functional Limitations Info  ?    ?  ?   ? ? ?SPECIAL CARE FACTORS FREQUENCY  ?    ?  ?  ?  ?  ?  ?  ?   ? ? ?Contractures Contractures Info: Not present  ? ? ?Additional Factors Info  ?Code Status, Allergies Code Status Info: Full ?Allergies Info: NKDA ?  ?  ?  ?   ? ?Medication List  ?   ?  ?STOP taking these medications   ?  ?acetaminophen 325 MG tablet ?Commonly known as: TYLENOL ?   ?  ?   ?  ?TAKE these medications   ?  ?alfuzosin 10 MG 24 hr tablet ?Commonly known as: UROXATRAL ?TAKE 1 TABLET BY MOUTH DAILY ?   ?ALPRAZolam 0.25 MG tablet ?Commonly known as:  XANAX ?TAKE ONE TABLET BY MOUTH 2 TIMES A DAY AS NEEDED FOR ANXIETY (WHEN BLOOD PRESSURE IS VERY HIGH OR ANXIOUS) ?What changed:  ?how much to take ?how to take this ?when to take this ?reasons to take this ?additional instructions ?   ?amoxicillin-clavulanate 875-125 MG tablet ?Commonly known as: AUGMENTIN ?Take 1 tablet by mouth every 12 (twelve) hours. ?   ?Artificial Tears 0.2-0.2-1 % Soln ?Generic drug: Glycerin-Hypromellose-PEG 400 ?PLACE 2 DROPS INTO EYE 2 TIMES A DAY AS NEEDED ?   ?atorvastatin 40 MG tablet ?Commonly known as: LIPITOR ?TAKE 1 TABLET BY MOUTH EVERY EVENING ?   ?augmented betamethasone dipropionate 0.05 % cream ?Commonly known as: DIPROLENE-AF ?APPLY TOPICALLY AS DIRECTED TO RASH AS NEEDED ?    ?Calcium Carb-Cholecalciferol 500-10 MG-MCG Tabs ?TAKE 1 TABLET BY MOUTH 2 TIMES A DAY ?   ?Cranberry Juice Powder 425 MG Caps ?TAKE 1 CAPSULE BY MOUTH ONCE DAILY ?   ?Depend Silhouette Briefs L/XL Misc ?1 each by Does not apply route 2 (two) times daily as needed. ?   ?Ensure Original Liqd ?Take 1 Can by mouth 3 (three) times daily. ?   ?fluconazole 100 MG tablet ?Commonly known as: DIFLUCAN ?Take 1 tablet (100 mg total) by mouth daily. X 7 days ?   ?Fusion Plus Caps ?TAKE 1 CAPSULE BY MOUTH DAILY ?   ?GNP Vitamin C 500 MG tablet ?Generic drug: ascorbic acid ?TAKE ONE TABLET BY MOUTH ONCE DAILY ?   ?HYDROcodone-acetaminophen 5-325 MG tablet ?Commonly known as: NORCO/VICODIN ?Take 1 tablet by mouth every 6 (six) hours as needed for moderate pain. ?   ?lubiprostone 24 MCG capsule ?Commonly known as: AMITIZA ?TAKE 1 CAPSULE BY MOUTH 2 TIMES DAILY WITH A MEAL ?   ?Medical Compression Stockings Misc ?1 Units by Does not apply route daily. ?   ?nystatin cream ?Commonly known as: MYCOSTATIN ?Apply 1 application topically 2 (two) times daily. ?   ?sertraline 100 MG tablet ?Commonly known as: ZOLOFT ?TAKE 1 TABLET BY MOUTH DAILY ?   ?SYSTANE ULTRA OP ?Place 1 drop into both eyes 2 (two) times daily as needed (dry eyes).  ? ?Relevant Imaging Results: ? ?Relevant Lab Results: ? ? ?Additional Information ?[phone] ? ?Beverly Sessions, RN ? ? ? ? ?

## 2021-07-06 NOTE — Plan of Care (Signed)
?  Problem: Education: ?Goal: Required Educational Video(s) ?Outcome: Adequate for Discharge ?  ?Problem: Clinical Measurements: ?Goal: Postoperative complications will be avoided or minimized ?07/06/2021 1403 by Evangeline Dakin, RN ?Outcome: Adequate for Discharge ?07/06/2021 1402 by Evangeline Dakin, RN ?Outcome: Progressing ?  ?Problem: Skin Integrity: ?Goal: Demonstration of wound healing without infection will improve ?Outcome: Adequate for Discharge ?  ?

## 2021-07-06 NOTE — Discharge Instructions (Signed)
Supplies for dressing change are given to [name] (iodoform gauze, 4 x 4's and normal saline).  Instructed Barbara on how to remove the dressing and to repack the wound with iodoform gauze, to wet one 4 x 4 within normal saline to place on top to prevent adhering to the skin and then cover with dry 4 x 4's and use the scrotal support underwear to keep dressing in place daily.   ? ?Prescriptions:  You will be provided a prescription for pain medication to take as needed.  If your pain is not severe enough to require the prescription pain medication, you may take extra strength Tylenol instead which will have less side effects.  You should also take a prescribed stool softener to avoid straining with bowel movements as the prescription pain medication may constipate you. ? ?What to call us about: You should call the office if you develop fever > 101 or develop persistent vomiting, redness or draining around your incision, or any other concerning symptoms.   ? ?[facility] ?[address], [address] ?[place], [place] [address] ?[phone]  ?

## 2021-07-06 NOTE — Discharge Summary (Signed)
Date of admission: 07/05/2021 ? ?Date of discharge: 07/06/2021 ? ?Admission diagnosis: Left scrotal abscess ? ?Discharge diagnosis: Left scrotal abscess ? ?Secondary diagnoses:  ?Patient Active Problem List  ? Diagnosis Date Noted  ? Scrotal swelling 07/05/2021  ? Urinary incontinence 05/26/2021  ? Hypotension 05/20/2021  ? Rectal bleeding 05/09/2020  ? Hematochezia   ? HCAP (healthcare-associated pneumonia) 05/07/2020  ? BPH (benign prostatic hyperplasia)   ? Hyperlipidemia   ? Depression with anxiety   ? Protein-calorie malnutrition, severe 04/22/2020  ? Pressure injury of skin 04/22/2020  ? Multifocal pneumonia   ? Sepsis due to undetermined organism ([place]) 04/19/2020  ? Personal history of urinary calculi 06/21/2019  ? PAD (peripheral artery disease) ([place]) 07/04/2017  ? Aortic valve calcification 07/04/2017  ? Coronary artery calcification 07/04/2017  ? Late effect acute polio 05/17/2016  ? Chronic constipation 11/19/2015  ? Mild major depression ([place]) 11/19/2015  ? Bacteriuria, chronic 09/11/2015  ? History of prostate cancer 03/02/2015  ? Amyotrophia 03/02/2015  ? Edema leg 03/02/2015  ? Cerebral palsy ([place]) 03/02/2015  ? Dyslipidemia 03/02/2015  ? Dermatitis, eczematoid 03/02/2015  ? Divergent squint 03/02/2015  ? H/O acute poliomyelitis 03/02/2015  ? Lymphedema 03/02/2015  ? Intellectual disability 03/02/2015  ? Hypertrophy of nail 03/02/2015  ? Chronic venous insufficiency 03/02/2015  ? Avitaminosis D 03/02/2015  ? Adynamia 03/02/2015  ? Dependent on wheelchair 03/02/2015  ? Renal cysts, acquired, bilateral 03/02/2015  ? At risk for falling 03/02/2015  ? Cerebral vascular disease 03/02/2015  ? Strabismus 02/11/2015  ? Wheelchair dependence 02/11/2015  ? ? ?History and Physical: For full details, please see admission history and physical. Briefly, [name] is a 81 y.o. year old patient with a left scrotal abscess who underwent scrotal exploration and incision and drainage of scrotal wall abscess  yesterday with Dr. Bernardo Heater.  ? ?Hospital Course: Patient tolerated the procedure well.  He was then transferred to the floor after an uneventful PACU stay.  His hospital course was uncomplicated.  On POD#1 he had met discharge criteria: was eating a regular diet, was up and ambulating independently,  pain was well controlled and was ready to for discharge. ? ?Physical Exam: ?Constitutional:  Well nourished. Alert and oriented, No acute distress. ?HEENT: [place] AT, moist mucus membranes.  Trachea midline ?Cardiovascular: No clubbing, cyanosis, or edema. ?Respiratory: Normal respiratory effort, no increased work of breathing. ?GU: No CVA tenderness.  No bladder fullness or masses.  Patient with uncircumcised phallus.  Foreskin easily retracted  Urethral meatus is patent.  No penile discharge. No penile lesions or rashes. Scrotum without lesions, cysts, rashes and/or edema.  Incision site packed with iodoform gauze and covered with 4 x 4 guaze.  A small amount of blood was on the gauze.  Testicles are located scrotally bilaterally. Left testicle hard.  No crepitus, no fluctuance or erythema noted.   ?Neurologic: Grossly intact, no focal deficits, moving all 4 extremities. ?Psychiatric: Normal mood and affect.  ? ?Laboratory values:  ?Recent Labs  ?  07/05/21 ?1855  ?WBC 4.0  ?HGB 12.7*  ?HCT 37.8*  ? ?Recent Labs  ?  07/05/21 ?1855  ?CREATININE 0.60*  ? ?No results for input(s): LABPT, INR in the last 72 hours. ?No results for input(s): LABURIN in the last 72 hours. ?Results for orders placed or performed during the hospital encounter of 05/28/21  ?Aerobic Culture w Gram Stain (superficial specimen)     Status: None  ? Collection Time: 05/28/21 10:13 PM  ? Specimen: Scrotum  ?  Result Value Ref Range Status  ? Specimen Description   Final  ?  SCROTUM ?Performed at [facility], [address] [place], [place] [address] ?  ? Special Requests   Final  ?  NONE ?Performed at [facility], [address] [place], [place] [address] ?  ? Gram Stain   Final  ?  FEW WBC PRESENT, PREDOMINANTLY PMN ?NO ORGANISMS SEEN ?Performed at [facility], [place] [address], [place], [place] [address] ?  ? Culture RARE ENTEROCOCCUS FAECALIS  Final  ? Report Status 06/01/2021 FINAL  Final  ? Organism ID, Bacteria ENTEROCOCCUS FAECALIS  Final  ?    Susceptibility  ? Enterococcus faecalis - MIC*  ?  AMPICILLIN <=2 SENSITIVE Sensitive   ?  VANCOMYCIN 1 SENSITIVE Sensitive   ?  GENTAMICIN SYNERGY RESISTANT Resistant   ?  * RARE ENTEROCOCCUS FAECALIS  ?Resp Panel by RT-PCR (Flu A&B, Covid) Nasopharyngeal Swab     Status: None  ? Collection Time: 05/28/21 10:13 PM  ? Specimen: Nasopharyngeal Swab; Nasopharyngeal(NP) swabs in vial transport medium  ?Result Value Ref Range Status  ? SARS Coronavirus 2 by RT PCR NEGATIVE NEGATIVE Final  ?  Comment: (NOTE) ?SARS-CoV-2 target nucleic acids are NOT DETECTED. ? ?The SARS-CoV-2 RNA is generally detectable in upper respiratory ?specimens during the acute phase of infection. The lowest ?concentration of SARS-CoV-2 viral copies this assay can detect is ?138 copies/mL. A negative result does not preclude SARS-Cov-2 ?infection and should not be used as the sole basis for treatment or ?other patient management decisions. A negative result may occur with  ?improper specimen collection/handling, submission of specimen other ?than nasopharyngeal swab, presence of viral mutation(s) within the ?areas targeted by this assay, and inadequate number of viral ?copies(<138 copies/mL). A negative result must be combined with ?clinical observations, patient history, and epidemiological ?information. The expected result is Negative. ? ?Fact Sheet for Patients:  ?EntrepreneurPulse.com.au ? ?Fact Sheet for Healthcare Providers:  ?IncredibleEmployment.be ? ?This test is no t yet approved or cleared by the [place] FDA and  ?has been authorized for detection and/or diagnosis of  SARS-CoV-2 by ?FDA under an Emergency Use Authorization (EUA). This EUA will remain  ?in effect (meaning this test can be used) for the duration of the ?COVID-19 declaration under Section 564(b)(1) of the Act, 21 ?U.S.C.section 360bbb-3(b)(1), unless the authorization is terminated  ?or revoked sooner.  ? ? ?  ? Influenza A by PCR NEGATIVE NEGATIVE Final  ? Influenza B by PCR NEGATIVE NEGATIVE Final  ?  Comment: (NOTE) ?The Xpert Xpress SARS-CoV-2/FLU/RSV plus assay is intended as an aid ?in the diagnosis of influenza from Nasopharyngeal swab specimens and ?should not be used as a sole basis for treatment. Nasal washings and ?aspirates are unacceptable for Xpert Xpress SARS-CoV-2/FLU/RSV ?testing. ? ?Fact Sheet for Patients: ?EntrepreneurPulse.com.au ? ?Fact Sheet for Healthcare Providers: ?IncredibleEmployment.be ? ?This test is not yet approved or cleared by the [place] FDA and ?has been authorized for detection and/or diagnosis of SARS-CoV-2 by ?FDA under an Emergency Use Authorization (EUA). This EUA will remain ?in effect (meaning this test can be used) for the duration of the ?COVID-19 declaration under Section 564(b)(1) of the Act, 21 U.S.C. ?section 360bbb-3(b)(1), unless the authorization is terminated or ?revoked. ? ?Performed at [facility], [place], ?[place] [address] ?  ? ? ?I changed the dressing with staff member, [name], present.  Gauze is removed along with the iodoform dressing to  reveal healthy granulation tissue.  It is then repacked with iodoform and covered with wet to dry 4 x 4's.  Supportive underwear in place.   ? ?Disposition: Home with [place] ? ?Discharge instruction: The patient was instructed to be ambulatory but told to refrain from heavy lifting, strenuous activity or driving.  Dressing to be changed daily. ? ?Discharge medications:  ?Allergies as of 07/06/2021   ?No Known Allergies ?  ? ?  ?Medication List  ?  ? ?STOP  taking these medications   ? ?acetaminophen 325 MG tablet ?Commonly known as: TYLENOL ?  ? ?  ? ?TAKE these medications   ? ?alfuzosin 10 MG 24 hr tablet ?Commonly known as: UROXATRAL ?TAKE 1 TABLET BY MOUTH DA

## 2021-07-06 NOTE — TOC Initial Note (Signed)
Transition of Care (TOC) - Initial/Assessment Note  ? ? ?Patient Details  ?Name: [name] ?MRN: [id number] ?Date of Birth: [birth year] ? ?Transition of Care (TOC) CM/SW Contact:    ?Beverly Sessions, RN ?Phone Number: ?07/06/2021, 2:10 PM ? ?Clinical Narrative:                 ? ?Patient to discharge back to [facility] group home today ?Voicemail left for Delrae Rend to notify of discharge ? ?Fl2 and dc summary faxed to Yvone Neu at [phone] ? ?Bedside Rn to call report to Adonis Brook at group home [phone], and will arrange DC transport ? ?Expected Discharge Plan: Group Home ?Barriers to Discharge: Barriers Resolved ? ? ?Patient Goals and CMS Choice ?  ?  ?  ? ?Expected Discharge Plan and Services ?Expected Discharge Plan: Group Home ?  ?  ?  ?Living arrangements for the past 2 months: Group Home ?Expected Discharge Date: 07/06/21               ?  ?  ?  ?  ?  ?  ?  ?  ?  ?  ? ?Prior Living Arrangements/Services ?Living arrangements for the past 2 months: Group Home ?Lives with:: Facility Resident ?Patient language and need for interpreter reviewed:: Yes ?       ?Need for Family Participation in Patient Care: Yes (Comment) ?Care giver support system in place?: Yes (comment) ?Current home services: DME ?Criminal Activity/Legal Involvement Pertinent to Current Situation/Hospitalization: No - Comment as needed ? ?Activities of Daily Living ?Home Assistive Devices/Equipment: Wheelchair ?ADL Screening (condition at time of admission) ?Patient's cognitive ability adequate to safely complete daily activities?: No ?Is the patient deaf or have difficulty hearing?: No ?Does the patient have difficulty seeing, even when wearing glasses/contacts?: No ?Does the patient have difficulty concentrating, remembering, or making decisions?: Yes ?Patient able to express need for assistance with ADLs?: No ?Does the patient have difficulty dressing or bathing?: Yes ?Independently performs ADLs?: No ?Communication: Needs  assistance ?Is this a change from baseline?: Pre-admission baseline ?Dressing (OT): Needs assistance ?Is this a change from baseline?: Pre-admission baseline ?Grooming: Needs assistance ?Is this a change from baseline?: Pre-admission baseline ?Feeding: Needs assistance ?Is this a change from baseline?: Pre-admission baseline ?Bathing: Needs assistance ?Is this a change from baseline?: Pre-admission baseline ?Toileting: Needs assistance ?Is this a change from baseline?: Pre-admission baseline ?In/Out Bed: Needs assistance ?Is this a change from baseline?: Pre-admission baseline ?Walks in Home: Needs assistance ?Is this a change from baseline?: Pre-admission baseline ?Does the patient have difficulty walking or climbing stairs?: No ?Weakness of Legs: Both ?Weakness of Arms/Hands: Both ? ?Permission Sought/Granted ?  ?  ?   ?   ?   ?   ? ?Emotional Assessment ?  ?  ?  ?Orientation: : Oriented to Self ?Alcohol / Substance Use: Not Applicable ?Psych Involvement: No (comment) ? ?Admission diagnosis:  Scrotal swelling [N50.89] ?Patient Active Problem List  ? Diagnosis Date Noted  ? Scrotal swelling 07/05/2021  ? Urinary incontinence 05/26/2021  ? Hypotension 05/20/2021  ? Rectal bleeding 05/09/2020  ? Hematochezia   ? HCAP (healthcare-associated pneumonia) 05/07/2020  ? BPH (benign prostatic hyperplasia)   ? Hyperlipidemia   ? Depression with anxiety   ? Protein-calorie malnutrition, severe 04/22/2020  ? Pressure injury of skin 04/22/2020  ? Multifocal pneumonia   ? Sepsis due to undetermined organism ([place]) 04/19/2020  ? Personal history of urinary calculi 06/21/2019  ? PAD (peripheral artery disease) ([place])  07/04/2017  ? Aortic valve calcification 07/04/2017  ? Coronary artery calcification 07/04/2017  ? Late effect acute polio 05/17/2016  ? Chronic constipation 11/19/2015  ? Mild major depression ([place]) 11/19/2015  ? Bacteriuria, chronic 09/11/2015  ? History of prostate cancer 03/02/2015  ? Amyotrophia 03/02/2015  ? Edema  leg 03/02/2015  ? Cerebral palsy ([place]) 03/02/2015  ? Dyslipidemia 03/02/2015  ? Dermatitis, eczematoid 03/02/2015  ? Divergent squint 03/02/2015  ? H/O acute poliomyelitis 03/02/2015  ? Lymphedema 03/02/2015  ? Intellectual disability 03/02/2015  ? Hypertrophy of nail 03/02/2015  ? Chronic venous insufficiency 03/02/2015  ? Avitaminosis D 03/02/2015  ? Adynamia 03/02/2015  ? Dependent on wheelchair 03/02/2015  ? Renal cysts, acquired, bilateral 03/02/2015  ? At risk for falling 03/02/2015  ? Cerebral vascular disease 03/02/2015  ? Strabismus 02/11/2015  ? Wheelchair dependence 02/11/2015  ? ?PCP:  Steele Sizer, MD ?Pharmacy:   ?[facility], [address] ?[address] ?[place] [address] ?Phone: [phone] Fax: [phone] ? ? ? ? ?Social Determinants of Health (SDOH) Interventions ?  ? ?Readmission Risk Interventions ?No flowsheet data found. ? ? ?

## 2021-07-06 NOTE — Anesthesia Postprocedure Evaluation (Signed)
Anesthesia Post Note ? ?Patient: [name] ? ?Procedure(s) Performed: SCROTUM EXPLORATION (Left) ?INCISION AND DRAINAGE SCROTAL WALL ABSCESS (Left) ? ?Patient location during evaluation: PACU ?Anesthesia Type: General ?Level of consciousness: awake and alert ?Pain management: pain level controlled ?Vital Signs Assessment: post-procedure vital signs reviewed and stable ?Respiratory status: spontaneous breathing, nonlabored ventilation, respiratory function stable and patient connected to nasal cannula oxygen ?Cardiovascular status: blood pressure returned to baseline and stable ?Postop Assessment: no apparent nausea or vomiting ?Anesthetic complications: no ? ? ?No notable events documented. ? ? ?Last Vitals:  ?Vitals:  ? 07/05/21 2031 07/06/21 0324  ?BP: 117/78   ?Pulse: 79   ?Resp: 18 (P) 16  ?Temp: 36.4 ?C (P) 36.6 ?C  ?SpO2: 99% (P) 95%  ?  ?Last Pain:  ?Vitals:  ? 07/06/21 0324  ?TempSrc: (P) Oral  ?PainSc:   ? ? ?  ?  ?  ?  ?  ?  ? ?[name] ? ? ? ? ?

## 2021-07-06 NOTE — [place] FL2 (Signed)
?[place] MEDICAID FL2 LEVEL OF CARE SCREENING TOOL  ?  ? ?IDENTIFICATION  ?Patient Name: ?[name] Birthdate: [birth year] Sex: male Admission Date (Current Location): ?07/05/2021  ?[place] and [place] Number: ? [place] ?  Facility and Address:  ?  ?     Provider Number: ?[id number]  ?Attending Physician Name and Address:  ?Abbie Sons, MD ? Relative Name and Phone Number:  ?  ?   ?Current Level of Care: ?Hospital Recommended Level of Care: ?Family Care Home Prior Approval Number: ?  ? ?Date Approved/Denied: ?  PASRR Number: ?  ? ?Discharge Plan: ?Other (Comment) (Family care home/group home) ?  ? ?Current Diagnoses: ?Patient Active Problem List  ? Diagnosis Date Noted  ? Scrotal swelling 07/05/2021  ? Urinary incontinence 05/26/2021  ? Hypotension 05/20/2021  ? Rectal bleeding 05/09/2020  ? Hematochezia   ? HCAP (healthcare-associated pneumonia) 05/07/2020  ? BPH (benign prostatic hyperplasia)   ? Hyperlipidemia   ? Depression with anxiety   ? Protein-calorie malnutrition, severe 04/22/2020  ? Pressure injury of skin 04/22/2020  ? Multifocal pneumonia   ? Sepsis due to undetermined organism ([place]) 04/19/2020  ? Personal history of urinary calculi 06/21/2019  ? PAD (peripheral artery disease) ([place]) 07/04/2017  ? Aortic valve calcification 07/04/2017  ? Coronary artery calcification 07/04/2017  ? Late effect acute polio 05/17/2016  ? Chronic constipation 11/19/2015  ? Mild major depression ([place]) 11/19/2015  ? Bacteriuria, chronic 09/11/2015  ? History of prostate cancer 03/02/2015  ? Amyotrophia 03/02/2015  ? Edema leg 03/02/2015  ? Cerebral palsy ([place]) 03/02/2015  ? Dyslipidemia 03/02/2015  ? Dermatitis, eczematoid 03/02/2015  ? Divergent squint 03/02/2015  ? H/O acute poliomyelitis 03/02/2015  ? Lymphedema 03/02/2015  ? Intellectual disability 03/02/2015  ? Hypertrophy of nail 03/02/2015  ? Chronic venous insufficiency 03/02/2015  ? Avitaminosis D 03/02/2015  ? Adynamia 03/02/2015  ? Dependent on  wheelchair 03/02/2015  ? Renal cysts, acquired, bilateral 03/02/2015  ? At risk for falling 03/02/2015  ? Cerebral vascular disease 03/02/2015  ? Strabismus 02/11/2015  ? Wheelchair dependence 02/11/2015  ? ? ?Orientation RESPIRATION BLADDER Height & Weight   ?  ?Self ? Normal Indwelling catheter Weight: 64.9 kg ?Height:  '5\' 10"'$  (177.8 cm)  ?BEHAVIORAL SYMPTOMS/MOOD NEUROLOGICAL BOWEL NUTRITION STATUS  ?    Continent Diet (dys 1, thin liquid)  ?AMBULATORY STATUS COMMUNICATION OF NEEDS Skin   ? (WC bound, hoyer lift) Verbally Surgical wounds ?  ?  ?  ?    ?     ?     ? ? ?Personal Care Assistance Level of Assistance  ?Total care   ?  ?  ?   ? ?Functional Limitations Info  ?    ?  ?   ? ? ?SPECIAL CARE FACTORS FREQUENCY  ?    ?  ?  ?  ?  ?  ?  ?   ? ? ?Contractures Contractures Info: Not present  ? ? ?Additional Factors Info  ?Code Status, Allergies Code Status Info: Full ?Allergies Info: NKDA ?  ?  ?  ?   ? ?Current Medications (07/06/2021):  This is the current hospital active medication list ?Current Facility-Administered Medications  ?Medication Dose Route Frequency Provider Last Rate Last Admin  ? alfuzosin (UROXATRAL) 24 hr tablet 10 mg  10 mg Oral Daily Stoioff, Scott C, MD   10 mg at 07/06/21 1478  ? ALPRAZolam (XANAX) tablet 0.25 mg  0.25 mg Oral Daily PRN Abbie Sons, MD      ?  amoxicillin-clavulanate (AUGMENTIN) 875-125 MG per tablet 1 tablet  1 tablet Oral Q12H Stoioff, Ronda Fairly, MD   1 tablet at 07/06/21 0908  ? atorvastatin (LIPITOR) tablet 40 mg  40 mg Oral QPM Stoioff, Scott C, MD      ? calcium-vitamin D (OSCAL WITH D) 500-5 MG-MCG per tablet 1 tablet  1 tablet Oral BID Abbie Sons, MD   1 tablet at 07/06/21 0909  ? Chlorhexidine Gluconate Cloth 2 % PADS 6 each  6 each Topical Daily Stoioff, Scott C, MD      ? enoxaparin (LOVENOX) injection 40 mg  40 mg Subcutaneous Q24H Stoioff, Scott C, MD   40 mg at 07/06/21 0907  ? feeding supplement (ENSURE ENLIVE / ENSURE PLUS) liquid 237 mL  1 Bottle  Oral TID Abbie Sons, MD   237 mL at 07/06/21 0908  ? fluconazole (DIFLUCAN) tablet 100 mg  100 mg Oral Daily Stoioff, Scott C, MD   100 mg at 07/06/21 0908  ? HYDROcodone-acetaminophen (NORCO/VICODIN) 5-325 MG per tablet 1 tablet  1 tablet Oral Q6H PRN Stoioff, Scott C, MD      ? lactated ringers infusion   Intravenous Continuous Stoioff, Ronda Fairly, MD 50 mL/hr at 07/06/21 0509 Infusion Verify at 07/06/21 0509  ? lubiprostone (AMITIZA) capsule 24 mcg  24 mcg Oral BID WC Abbie Sons, MD   24 mcg at 07/06/21 0092  ? MEDLINE mouth rinse  15 mL Mouth Rinse BID Stoioff, Scott C, MD   15 mL at 07/06/21 0909  ? ondansetron (ZOFRAN) injection 4 mg  4 mg Intravenous Q4H PRN Stoioff, Scott C, MD      ? polyvinyl alcohol (LIQUIFILM TEARS) 1.4 % ophthalmic solution 2 drop  2 drop Both Eyes BID PRN Stoioff, Scott C, MD      ? sertraline (ZOLOFT) tablet 100 mg  100 mg Oral Daily Stoioff, Scott C, MD   100 mg at 07/06/21 0908  ? ? ? ?Discharge Medications: ?Please see discharge summary for a list of discharge medications. ? ?Relevant Imaging Results: ? ?Relevant Lab Results: ? ? ?Additional Information ?[id number] ? ?Beverly Sessions, RN ? ? ? ? ?

## 2021-07-07 ENCOUNTER — Encounter: Payer: Self-pay | Admitting: Urology

## 2021-07-12 ENCOUNTER — Other Ambulatory Visit: Payer: Self-pay

## 2021-07-12 ENCOUNTER — Ambulatory Visit (INDEPENDENT_AMBULATORY_CARE_PROVIDER_SITE_OTHER): Payer: Medicare Other | Admitting: Urology

## 2021-07-12 VITALS — BP 98/58 | HR 133

## 2021-07-12 DIAGNOSIS — N451 Epididymitis: Secondary | ICD-10-CM

## 2021-07-12 NOTE — Progress Notes (Addendum)
? ? ?07/12/2021 ?8:49 AM  ? ?[name] ?Oct 08, 1940 ?[id number] ? ?Referring provider: Steele Sizer, MD ?[address] ?[address] ?[place],  [place] [address] ? ?Chief Complaint  ?Patient presents with  ? Follow-up  ? ?Urological history ?1.  History T1c intermediate risk prostate cancer ?-Gleason 3+4 diagnosed 2010 ?-Treated with brachytherapy 11/10/2008 ?-Last PSA 10/2015 <0.1 ?  ?2.  Chronic bacteriuria ?-Cystoscopy Dr. Elnoria Howard 2015 unremarkable, nonocclusive prostate or stricture ?  ?3.  Stone disease ?- CT 2019 nonobstructing right mid ureteral calculus ?-Ureteroscopic stone removal 08/14/2016 ?  ?4.  Renal cysts ?-CTU 2019 bilateral renal cysts with 2 indeterminate right renal lesions (18 mm interpolar left kidney and 15 mm anterior right kidney) ?-MRI 07/2017 showed the left renal lesion was not present and was a perfusion anomaly; right renal lesion a Bosniak 2 cyst ?-CTU 2022 - There is 1.9 cm smooth marginated low-density lesion in the midportion of left kidney. There is 2.2 cm cyst in the anterior midportion of right kidney. There is 2.3 cm exophytic lesion in the anterior midportion of right kidney, possibly cyst  ? ?HPI: ?[name] is a 81 y.o. male who presents today for recheck on left epididymitis, bilateral nephrolithiasis and balanitis with caregiver, [name]. ? ?He has been suffering from a left scrotal infection for several weeks.  Scrotal US 05/17/2021 - findings concerning for a complex hydrocele secondary to previous infection.  He was seen in the ED on 05/28/2020 for purulent drainage from the left hemiscrotum.  ? ?S/P Left hemiscrotal exploration 07/05/2021 ? ?History obtained from staff member, [name]. ? ?They have been changing the dressing twice daily.  There has been no purulent drainage, erythema, fluctuance or crepitus within the wound.  They said the iodoform gauze just has blood on it. ? ? ?PMH: ?Past Medical History:  ?Diagnosis Date  ? Anxiety   ? Atrophy, disuse, muscle   ?  BPH (benign prostatic hyperplasia)   ? Cerebral palsy ([place])   ? Eczema   ? Elevated lipids   ? Elevated PSA   ? Exotropia   ? Hyperlipidemia   ? Kidney stone   ? Leukopenia   ? Lower extremity edema   ? lymphedema  ? Mental retardation   ? Over weight   ? Prostate cancer ([place])   ? Venous insufficiency   ? Vitamin D deficiency   ? Wheelchair dependence   ? ? ?Surgical History: ?Past Surgical History:  ?Procedure Laterality Date  ? COLONOSCOPY WITH PROPOFOL N/A 05/10/2020  ? Procedure: COLONOSCOPY WITH PROPOFOL;  Surgeon: Lin Landsman, MD;  Location: [facility] ENDOSCOPY;  Service: Gastroenterology;  Laterality: N/A;  ? COLONOSCOPY WITH PROPOFOL N/A 05/12/2020  ? Procedure: COLONOSCOPY WITH PROPOFOL;  Surgeon: Lin Landsman, MD;  Location: [facility] ENDOSCOPY;  Service: Gastroenterology;  Laterality: N/A;  ? CYSTOSCOPY/URETEROSCOPY/HOLMIUM LASER/STENT PLACEMENT Right 08/14/2017  ? Procedure: CYSTOSCOPY/URETEROSCOPY/HOLMIUM LASER/STENT PLACEMENT;  Surgeon: Abbie Sons, MD;  Location: [facility] ORS;  Service: Urology;  Laterality: Right;  ? ESOPHAGOGASTRODUODENOSCOPY (EGD) WITH PROPOFOL N/A 05/12/2020  ? Procedure: ESOPHAGOGASTRODUODENOSCOPY (EGD) WITH PROPOFOL;  Surgeon: Lin Landsman, MD;  Location: [facility] ENDOSCOPY;  Service: Gastroenterology;  Laterality: N/A;  ? INCISION AND DRAINAGE ABSCESS Left 07/05/2021  ? Procedure: INCISION AND DRAINAGE SCROTAL WALL ABSCESS;  Surgeon: Abbie Sons, MD;  Location: [facility] ORS;  Service: Urology;  Laterality: Left;  ? leg circulation surgery Right   ? PROSTATE SURGERY  11/10/2008  ? BRACHYTHERAPY  ? SCROTAL EXPLORATION Left 07/05/2021  ? Procedure: SCROTUM  EXPLORATION;  Surgeon: Abbie Sons, MD;  Location: [facility] ORS;  Service: Urology;  Laterality: Left;  ? ? ?Home Medications:  ?Allergies as of 07/12/2021   ?No Known Allergies ?  ? ?  ?Medication List  ?  ? ?  ? Accurate as of July 12, 2021  8:49 AM. If you have any questions, ask your nurse or doctor.  ?  ?  ? ?   ? ?alfuzosin 10 MG 24 hr tablet ?Commonly known as: UROXATRAL ?TAKE 1 TABLET BY MOUTH DAILY ?  ?ALPRAZolam 0.25 MG tablet ?Commonly known as: Duanne Moron ?TAKE ONE TABLET BY MOUTH 2 TIMES A DAY AS NEEDED FOR ANXIETY (WHEN BLOOD PRESSURE IS VERY HIGH OR ANXIOUS) ?What changed:  ?how much to take ?how to take this ?when to take this ?reasons to take this ?additional instructions ?  ?amoxicillin-clavulanate 875-125 MG tablet ?Commonly known as: AUGMENTIN ?Take 1 tablet by mouth every 12 (twelve) hours. ?  ?Artificial Tears 0.2-0.2-1 % Soln ?Generic drug: Glycerin-Hypromellose-PEG 400 ?PLACE 2 DROPS INTO EYE 2 TIMES A DAY AS NEEDED ?  ?atorvastatin 40 MG tablet ?Commonly known as: LIPITOR ?TAKE 1 TABLET BY MOUTH EVERY EVENING ?  ?augmented betamethasone dipropionate 0.05 % cream ?Commonly known as: DIPROLENE-AF ?APPLY TOPICALLY AS DIRECTED TO RASH AS NEEDED ?  ?Calcium Carb-Cholecalciferol 500-10 MG-MCG Tabs ?TAKE 1 TABLET BY MOUTH 2 TIMES A DAY ?  ?Cranberry Juice Powder 425 MG Caps ?TAKE 1 CAPSULE BY MOUTH ONCE DAILY ?  ?Depend Silhouette Briefs L/XL Misc ?1 each by Does not apply route 2 (two) times daily as needed. ?  ?Ensure Original Liqd ?Take 1 Can by mouth 3 (three) times daily. ?  ?fluconazole 100 MG tablet ?Commonly known as: DIFLUCAN ?Take 1 tablet (100 mg total) by mouth daily. X 7 days ?  ?Fusion Plus Caps ?TAKE 1 CAPSULE BY MOUTH DAILY ?  ?GNP Vitamin C 500 MG tablet ?Generic drug: ascorbic acid ?TAKE ONE TABLET BY MOUTH ONCE DAILY ?  ?HYDROcodone-acetaminophen 5-325 MG tablet ?Commonly known as: NORCO/VICODIN ?Take 1 tablet by mouth every 6 (six) hours as needed for moderate pain. ?  ?lubiprostone 24 MCG capsule ?Commonly known as: AMITIZA ?TAKE 1 CAPSULE BY MOUTH 2 TIMES DAILY WITH A MEAL ?  ?Medical Compression Stockings Misc ?1 Units by Does not apply route daily. ?  ?nystatin cream ?Commonly known as: MYCOSTATIN ?Apply 1 application topically 2 (two) times daily. ?  ?sertraline 100 MG tablet ?Commonly  known as: ZOLOFT ?TAKE 1 TABLET BY MOUTH DAILY ?  ?SYSTANE ULTRA OP ?Place 1 drop into both eyes 2 (two) times daily as needed (dry eyes). ?  ? ?  ? ? ?Allergies: No Known Allergies ? ?Family History: ?Family History  ?Problem Relation Age of Onset  ? Heart attack Brother   ? Heart disease Other   ? ? ?Social History:  reports that he has never smoked. He has never used smokeless tobacco. He reports that he does not drink alcohol and does not use drugs. ? ?ROS: ?Pertinent ROS in HPI ? ?Physical Exam: ?BP (!) 98/58   Pulse (!) 133   ?Constitutional:  Well nourished. Alert and oriented, No acute distress. ?HEENT: [place] AT, mask in place trachea midline ?Cardiovascular: No clubbing, cyanosis, or edema. ?Respiratory: Normal respiratory effort, no increased work of breathing. ?GU: No CVA tenderness.  No bladder fullness or masses.  Patient with uncircumcised phallus. Foreskin easily retracted  Urethral meatus is patent.  No penile discharge. No penile lesions or rashes. Scrotum without  lesions, cysts, rashes and/or edema.  Testicles are located scrotally bilaterally. No masses are appreciated in the testicles. Left and right epididymis are normal.  Left hemiscrotum is packed with iodoform dressing which is clean and dry, gauze on top is also clean and dry.  Scrotum is a healthy pink and left testicle is hard, but nontender to patient.  I do not appreciate any fluctuance, crepitus or erythema. ?Neurologic: Grossly intact, no focal deficits, moving all 4 extremities. ?Psychiatric: Normal mood and affect.  ? ?Laboratory Data: ?N/A ? ? ?Pertinent Imaging: ?N/A   ? ?Catheter Removal ? ?Patient is present today for a catheter removal.  9 ml of water was drained from the balloon. A 16 FR foley cath was removed from the bladder no complications were noted . Patient tolerated well. ? ?Performed by: Zara Council, PA-C  ? ?Assessment & Plan:   ? ?1.  Left epididymitis ?-s/p left hemiscrotal expiration ?-Continue dressing changes  twice daily until the wound is closed ?-Antibiotics completed ?-Instructed to contact us if he should develop fevers, purulent drainage, swelling or tenderness in the scrotum ? ?Return for Keep follow-up on April

## 2021-07-20 ENCOUNTER — Other Ambulatory Visit: Payer: Self-pay | Admitting: Urology

## 2021-07-20 DIAGNOSIS — N481 Balanitis: Secondary | ICD-10-CM

## 2021-07-26 NOTE — Progress Notes (Signed)
? ? ?07/27/2021 ?11:07 AM  ? ?[name] ?[birth year] ?[id number] ? ?Referring provider: Steele Sizer, MD ?[address] ?[address] ?[place],  [place] [address] ? ?Chief Complaint  ?Patient presents with  ? Follow-up  ? ?Urological history ?1.  History T1c intermediate risk prostate cancer ?-Gleason 3+4 diagnosed 2010 ?-Treated with brachytherapy 11/10/2008 ?-Last PSA 10/2015 <0.1 ?  ?2.  Chronic bacteriuria ?-Cystoscopy Dr. Elnoria Howard 2015 unremarkable, nonocclusive prostate or stricture ?  ?3.  Stone disease ?- CT 2019 nonobstructing right mid ureteral calculus ?-Ureteroscopic stone removal 08/14/2016 ?  ?4.  Renal cysts ?-CTU 2019 bilateral renal cysts with 2 indeterminate right renal lesions (18 mm interpolar left kidney and 15 mm anterior right kidney) ?-MRI 07/2017 showed the left renal lesion was not present and was a perfusion anomaly; right renal lesion a Bosniak 2 cyst ?-CTU 2022 - There is 1.9 cm smooth marginated low-density lesion in the midportion of left kidney. There is 2.2 cm cyst in the anterior midportion of right kidney. There is 2.3 cm exophytic lesion in the anterior midportion of right kidney, possibly cyst  ? ?HPI: ?[name] is a 81 y.o. male who presents today for recheck on left epididymitis, bilateral nephrolithiasis and balanitis with caregiver, Hassan Rowan. ? ?S/P Left hemiscrotal exploration 07/05/2021 ? ?History obtained from staff member, Hassan Rowan. ? ?There have been no issues with dressing changes.  There has been no issues with pain or discharge from the scrotum.   ? ?He did pass 2 stones since his last visit with [place].  Patient denies any modifying or aggravating factors.  Patient denies any gross hematuria, dysuria or suprapubic/flank pain.  Patient denies any fevers, chills, nausea or vomiting.   ? ? ?PMH: ?Past Medical History:  ?Diagnosis Date  ? Anxiety   ? Atrophy, disuse, muscle   ? BPH (benign prostatic hyperplasia)   ? Cerebral palsy ([place])   ? Eczema   ? Elevated lipids   ?  Elevated PSA   ? Exotropia   ? Hyperlipidemia   ? Kidney stone   ? Leukopenia   ? Lower extremity edema   ? lymphedema  ? Mental retardation   ? Over weight   ? Prostate cancer ([place])   ? Venous insufficiency   ? Vitamin D deficiency   ? Wheelchair dependence   ? ? ?Surgical History: ?Past Surgical History:  ?Procedure Laterality Date  ? COLONOSCOPY WITH PROPOFOL N/A 05/10/2020  ? Procedure: COLONOSCOPY WITH PROPOFOL;  Surgeon: Lin Landsman, MD;  Location: [facility] ENDOSCOPY;  Service: Gastroenterology;  Laterality: N/A;  ? COLONOSCOPY WITH PROPOFOL N/A 05/12/2020  ? Procedure: COLONOSCOPY WITH PROPOFOL;  Surgeon: Lin Landsman, MD;  Location: [facility] ENDOSCOPY;  Service: Gastroenterology;  Laterality: N/A;  ? CYSTOSCOPY/URETEROSCOPY/HOLMIUM LASER/STENT PLACEMENT Right 08/14/2017  ? Procedure: CYSTOSCOPY/URETEROSCOPY/HOLMIUM LASER/STENT PLACEMENT;  Surgeon: Abbie Sons, MD;  Location: [facility] ORS;  Service: Urology;  Laterality: Right;  ? ESOPHAGOGASTRODUODENOSCOPY (EGD) WITH PROPOFOL N/A 05/12/2020  ? Procedure: ESOPHAGOGASTRODUODENOSCOPY (EGD) WITH PROPOFOL;  Surgeon: Lin Landsman, MD;  Location: [facility] ENDOSCOPY;  Service: Gastroenterology;  Laterality: N/A;  ? INCISION AND DRAINAGE ABSCESS Left 07/05/2021  ? Procedure: INCISION AND DRAINAGE SCROTAL WALL ABSCESS;  Surgeon: Abbie Sons, MD;  Location: [facility] ORS;  Service: Urology;  Laterality: Left;  ? leg circulation surgery Right   ? PROSTATE SURGERY  11/10/2008  ? BRACHYTHERAPY  ? SCROTAL EXPLORATION Left 07/05/2021  ? Procedure: SCROTUM EXPLORATION;  Surgeon: Abbie Sons, MD;  Location: [facility] ORS;  Service: Urology;  Laterality: Left;  ? ? ?Home Medications:  ?Allergies as of 07/27/2021   ?No Known Allergies ?  ? ?  ?Medication List  ?  ? ?  ? Accurate as of July 27, 2021 11:07 AM. If you have any questions, ask your nurse or doctor.  ?  ?  ? ?  ? ?alfuzosin 10 MG 24 hr tablet ?Commonly known as: UROXATRAL ?TAKE 1 TABLET BY MOUTH DAILY ?   ?ALPRAZolam 0.25 MG tablet ?Commonly known as: Duanne Moron ?TAKE ONE TABLET BY MOUTH 2 TIMES A DAY AS NEEDED FOR ANXIETY (WHEN BLOOD PRESSURE IS VERY HIGH OR ANXIOUS) ?What changed:  ?how much to take ?how to take this ?when to take this ?reasons to take this ?additional instructions ?  ?amoxicillin-clavulanate 875-125 MG tablet ?Commonly known as: AUGMENTIN ?Take 1 tablet by mouth every 12 (twelve) hours. ?  ?Artificial Tears 0.2-0.2-1 % Soln ?Generic drug: Glycerin-Hypromellose-PEG 400 ?PLACE 2 DROPS INTO EYE 2 TIMES A DAY AS NEEDED ?  ?atorvastatin 40 MG tablet ?Commonly known as: LIPITOR ?TAKE 1 TABLET BY MOUTH EVERY EVENING ?  ?augmented betamethasone dipropionate 0.05 % cream ?Commonly known as: DIPROLENE-AF ?APPLY TOPICALLY AS DIRECTED TO RASH AS NEEDED ?  ?Calcium Carb-Cholecalciferol 500-10 MG-MCG Tabs ?TAKE 1 TABLET BY MOUTH 2 TIMES A DAY ?  ?Cranberry Juice Powder 425 MG Caps ?TAKE 1 CAPSULE BY MOUTH ONCE DAILY ?  ?Depend Silhouette Briefs L/XL Misc ?1 each by Does not apply route 2 (two) times daily as needed. ?  ?Ensure Original Liqd ?Take 1 Can by mouth 3 (three) times daily. ?  ?fluconazole 100 MG tablet ?Commonly known as: DIFLUCAN ?Take 1 tablet (100 mg total) by mouth daily. X 7 days ?  ?Fusion Plus Caps ?TAKE 1 CAPSULE BY MOUTH DAILY ?  ?GNP Vitamin C 500 MG tablet ?Generic drug: ascorbic acid ?TAKE ONE TABLET BY MOUTH ONCE DAILY ?  ?HYDROcodone-acetaminophen 5-325 MG tablet ?Commonly known as: NORCO/VICODIN ?Take 1 tablet by mouth every 6 (six) hours as needed for moderate pain. ?  ?lubiprostone 24 MCG capsule ?Commonly known as: AMITIZA ?TAKE 1 CAPSULE BY MOUTH 2 TIMES DAILY WITH A MEAL ?  ?Medical Compression Stockings Misc ?1 Units by Does not apply route daily. ?  ?nystatin cream ?Commonly known as: MYCOSTATIN ?APPLY 1 APPLICATION TOPICALLY 2 TIMES DAILY ?  ?sertraline 100 MG tablet ?Commonly known as: ZOLOFT ?TAKE 1 TABLET BY MOUTH DAILY ?  ?SYSTANE ULTRA OP ?Place 1 drop into both eyes 2 (two)  times daily as needed (dry eyes). ?  ? ?  ? ? ?Allergies: No Known Allergies ? ?Family History: ?Family History  ?Problem Relation Age of Onset  ? Heart attack Brother   ? Heart disease Other   ? ? ?Social History:  reports that he has never smoked. He has never used smokeless tobacco. He reports that he does not drink alcohol and does not use drugs. ? ?ROS: ?Pertinent ROS in HPI ? ?Physical Exam: ?BP 111/62   Pulse (!) 59   ?Constitutional:  Well nourished. Alert and oriented, No acute distress. ?HEENT: [place] AT, moist mucus membranes.  Trachea midline ?Cardiovascular: No clubbing, cyanosis, or edema. ?Respiratory: Normal respiratory effort, no increased work of breathing. ?GU: No CVA tenderness.  No bladder fullness or masses.  Patient with uncircumcised phallus. Foreskin easily retracted  Urethral meatus is patent.  No penile discharge. No penile lesions or rashes. Scrotum without lesions, cysts, rashes and/or edema.  Left testicle is indurated, but nontender.  No fluctuance, crepitus or  purulent drainage is elicited during palpation.  Testicles are located scrotally bilaterally. No masses are appreciated in the testicles. Right epididymis are normal. ?Psychiatric: Normal mood and affect.  ? ?Laboratory Data: ?N/A ? ? ?Pertinent Imaging: ?N/A   ? ?Assessment & Plan:   ? ?1.  Left epididymitis ?-s/p left hemiscrotal exploration ?-We can discontinue dressing changes ?-Antibiotics completed ?-Instructed to contact us if he should develop fevers, purulent drainage, swelling or tenderness in the scrotum ? ?Return in about 1 month (around 08/26/2021) for recheck . ? ?These notes generated with voice recognition software. I apologize for typographical errors. ? ?Neilson Oehlert, PA-C ? ?[place] ?[place], [place] [address] ?[phone] ?  ?

## 2021-07-27 ENCOUNTER — Encounter: Payer: Self-pay | Admitting: Urology

## 2021-07-27 ENCOUNTER — Ambulatory Visit (INDEPENDENT_AMBULATORY_CARE_PROVIDER_SITE_OTHER): Payer: Medicare Other | Admitting: Urology

## 2021-07-27 VITALS — BP 111/62 | HR 59

## 2021-07-27 DIAGNOSIS — N451 Epididymitis: Secondary | ICD-10-CM

## 2021-08-12 NOTE — Progress Notes (Signed)
Name: [name]   MRN: [id number]    DOB: 20-May-1940   Date:08/15/2021 ? ?     Progress Note ? ?Subjective ? ?Chief Complaint ? ?Annual Exam ? ?HPI ? ?Patient presents for annual CPE and follow up ? ?History of scrotum abscess/mass: doing much better, no pain at this time, under the care of Urologist, procedure done 06/2021. He also has incontinence and wears depends ? ?Atherosclerosis of aorta/PAD: he has been compliant with Atorvastatin , no side effects, we will recheck labs  ? ?Chronic constipation: doing well on Amitiza, bowel movements daily  ? ?Malnutrition: he had lost a lot of weight and BMI was down to 18, since scrotum infection under control his appetite has improved, eating more  ? ?MDD: he is doing much better, he likes the new group home, taking zoloft, alprazolam very seldom now, agitation has improved.  ? ?Cerebral Palsy/ history of polio : lives in a group home - he came in today with [name] - he is wheelchair bound, unable to transfer, he also has some atrophy of hands. He is able to eat without assistance. He needs help bathing , transferring, transportation, medication taking ,he has urine incontinent, he wears depends and sometimes also has bowel incontinence.  ? ?Anemia: history of iron deficiency anemia due to rectal bleeding but doing well lately, also has folic acid deficiency and we will recheck labs. No pica ? ?Unable to do the ASU questionaire: wears depends and sees Urologist  ? ?Diet: eating regular diet at the group home - heart healthy - family also brings snacks and fast food - he has gained weight ?Exercise: wheelchair bound, not active  ? ?Depression: phq 9 is negative ? ?  08/15/2021  ?  9:11 AM 05/26/2021  ? 11:10 AM 05/20/2021  ?  9:51 AM 08/19/2020  ?  2:55 PM 05/18/2020  ?  8:28 AM  ?Depression screen PHQ 2/9  ?Decreased Interest 0 0 0 0 0  ?Down, Depressed, Hopeless 0 0 0 0 0  ?PHQ - 2 Score 0 0 0 0 0  ?Altered sleeping 0 0 0    ?Tired, decreased energy 0 0 0    ?Change in  appetite 0 0 0    ?Feeling bad or failure about yourself  0 0 0    ?Trouble concentrating 0 0 0    ?Moving slowly or fidgety/restless 0 0 0    ?Suicidal thoughts 0 0 0    ?PHQ-9 Score 0 0 0    ? ? ?Hypertension:  ?BP Readings from Last 3 Encounters:  ?08/15/21 114/64  ?07/27/21 111/62  ?07/12/21 (!) 98/58  ? ? ?Obesity: ?Wt Readings from Last 3 Encounters:  ?07/06/21 143 lb 1.3 oz (64.9 kg)  ?05/26/21 136 lb (61.7 kg)  ?05/25/21 130 lb (59 kg)  ? ?BMI Readings from Last 3 Encounters:  ?07/06/21 20.53 kg/m?  ?05/26/21 19.51 kg/m?  ?05/25/21 18.65 kg/m?  ?  ? ?Lipids:  ?Lab Results  ?Component Value Date  ? CHOL 78 03/01/2020  ? CHOL 72 02/27/2019  ? CHOL 72 11/30/2017  ? ?Lab Results  ?Component Value Date  ? HDL 40 03/01/2020  ? HDL 39 (L) 02/27/2019  ? HDL 34 (L) 11/30/2017  ? ?Lab Results  ?Component Value Date  ? [place] 27 03/01/2020  ? [place] 19 02/27/2019  ? [place] 24 11/30/2017  ? ?Lab Results  ?Component Value Date  ? TRIG 37 03/01/2020  ? TRIG 49 02/27/2019  ? TRIG 64 11/30/2017  ? ?  Lab Results  ?Component Value Date  ? CHOLHDL 2.0 03/01/2020  ? CHOLHDL 1.8 02/27/2019  ? CHOLHDL 2.1 11/30/2017  ? ?No results found for: LDLDIRECT ?Glucose:  ?Glucose  ?Date Value Ref Range Status  ?08/14/2013 99 65 - 99 mg/dL Final  ?05/25/2013 109 (H) 65 - 99 mg/dL Final  ? ?Glucose, Bld  ?Date Value Ref Range Status  ?05/28/2021 114 (H) 70 - 99 mg/dL Final  ?  Comment:  ?  Glucose reference range applies only to samples taken after fasting for at least 8 hours.  ?05/26/2021 80 65 - 99 mg/dL Final  ?  Comment:  ?  . ?           Fasting reference interval ?. ?  ?05/21/2021 84 70 - 99 mg/dL Final  ?  Comment:  ?  Glucose reference range applies only to samples taken after fasting for at least 8 hours.  ? ?Glucose-Capillary  ?Date Value Ref Range Status  ?04/20/2020 126 (H) 70 - 99 mg/dL Final  ?  Comment:  ?  Glucose reference range applies only to samples taken after fasting for at least 8 hours.  ? ? ?Flowsheet Row  Clinical Support from 08/19/2020 in [facility]  ?AUDIT-C Score 0  ? ?  ? ? ?Single ?STD testing and prevention (HIV/chl/gon/syphilis): N/A ?Sexual history: not currently  ?Hep C Screening: 06/21/18 ?Skin cancer: Discussed monitoring for atypical lesions ?Colorectal cancer: 05/10/20 ?Prostate cancer:  under the care of Urologist  ? ? ? ?Lung cancer:  Low Dose CT Chest recommended if Age 27-80 years, 30 pack-year currently smoking OR have quit w/in 15years. Patient  not a candidate for screening   ?AAA: up to date   ?ECG:  05/23/21 ? ?Vaccines:  ? ?HPV: N/A ?Tdap: up to date ?Shingrix: done at local pharmacy  ?Pneumonia: up to date ?Flu: 2022 ?COVID-19: up to date ? ?Advanced Care Planning: A voluntary discussion about advance care planning including the explanation and discussion of advance directives.  Discussed health care proxy and Living will, and the patient was able to identify a health care proxy as niece Anselm Jungling - legal guardian.  Patient does  have a living will and power of attorney of health care  ? ?Patient Active Problem List  ? Diagnosis Date Noted  ? Scrotal swelling 07/05/2021  ? Urinary incontinence 05/26/2021  ? Hypotension 05/20/2021  ? Rectal bleeding 05/09/2020  ? Hematochezia   ? HCAP (healthcare-associated pneumonia) 05/07/2020  ? BPH (benign prostatic hyperplasia)   ? Hyperlipidemia   ? Depression with anxiety   ? Protein-calorie malnutrition, severe 04/22/2020  ? Pressure injury of skin 04/22/2020  ? Multifocal pneumonia   ? Sepsis due to undetermined organism ([place]) 04/19/2020  ? Personal history of urinary calculi 06/21/2019  ? PAD (peripheral artery disease) ([place]) 07/04/2017  ? Aortic valve calcification 07/04/2017  ? Coronary artery calcification 07/04/2017  ? Late effect acute polio 05/17/2016  ? Chronic constipation 11/19/2015  ? Mild major depression ([place]) 11/19/2015  ? Bacteriuria, chronic 09/11/2015  ? History of prostate cancer 03/02/2015  ? Amyotrophia  03/02/2015  ? Edema leg 03/02/2015  ? Cerebral palsy ([place]) 03/02/2015  ? Dyslipidemia 03/02/2015  ? Dermatitis, eczematoid 03/02/2015  ? Divergent squint 03/02/2015  ? H/O acute poliomyelitis 03/02/2015  ? Lymphedema 03/02/2015  ? Intellectual disability 03/02/2015  ? Hypertrophy of nail 03/02/2015  ? Chronic venous insufficiency 03/02/2015  ? Avitaminosis D 03/02/2015  ? Adynamia 03/02/2015  ? Dependent on wheelchair 03/02/2015  ?  Renal cysts, acquired, bilateral 03/02/2015  ? At risk for falling 03/02/2015  ? Cerebral vascular disease 03/02/2015  ? Strabismus 02/11/2015  ? Wheelchair dependence 02/11/2015  ? ? ?Past Surgical History:  ?Procedure Laterality Date  ? COLONOSCOPY WITH PROPOFOL N/A 05/10/2020  ? Procedure: COLONOSCOPY WITH PROPOFOL;  Surgeon: Lin Landsman, MD;  Location: [facility] ENDOSCOPY;  Service: Gastroenterology;  Laterality: N/A;  ? COLONOSCOPY WITH PROPOFOL N/A 05/12/2020  ? Procedure: COLONOSCOPY WITH PROPOFOL;  Surgeon: Lin Landsman, MD;  Location: [facility] ENDOSCOPY;  Service: Gastroenterology;  Laterality: N/A;  ? CYSTOSCOPY/URETEROSCOPY/HOLMIUM LASER/STENT PLACEMENT Right 08/14/2017  ? Procedure: CYSTOSCOPY/URETEROSCOPY/HOLMIUM LASER/STENT PLACEMENT;  Surgeon: Abbie Sons, MD;  Location: [facility] ORS;  Service: Urology;  Laterality: Right;  ? ESOPHAGOGASTRODUODENOSCOPY (EGD) WITH PROPOFOL N/A 05/12/2020  ? Procedure: ESOPHAGOGASTRODUODENOSCOPY (EGD) WITH PROPOFOL;  Surgeon: Lin Landsman, MD;  Location: [facility] ENDOSCOPY;  Service: Gastroenterology;  Laterality: N/A;  ? INCISION AND DRAINAGE ABSCESS Left 07/05/2021  ? Procedure: INCISION AND DRAINAGE SCROTAL WALL ABSCESS;  Surgeon: Abbie Sons, MD;  Location: [facility] ORS;  Service: Urology;  Laterality: Left;  ? leg circulation surgery Right   ? PROSTATE SURGERY  11/10/2008  ? BRACHYTHERAPY  ? SCROTAL EXPLORATION Left 07/05/2021  ? Procedure: SCROTUM EXPLORATION;  Surgeon: Abbie Sons, MD;  Location: [facility] ORS;  Service: Urology;   Laterality: Left;  ? ? ?Family History  ?Problem Relation Age of Onset  ? Heart attack Brother   ? Heart disease Other   ? ? ?Social History  ? ?Socioeconomic History  ? Marital status: Single  ?  Spouse name:

## 2021-08-12 NOTE — Patient Instructions (Signed)
Preventive Care 65 Years and Older, Male Preventive care refers to lifestyle choices and visits with your health care provider that can promote health and wellness. Preventive care visits are also called wellness exams. What can I expect for my preventive care visit? Counseling During your preventive care visit, your health care provider may ask about your: Medical history, including: Past medical problems. Family medical history. History of falls. Current health, including: Emotional well-being. Home life and relationship well-being. Sexual activity. Memory and ability to understand (cognition). Lifestyle, including: Alcohol, nicotine or tobacco, and drug use. Access to firearms. Diet, exercise, and sleep habits. Work and work environment. Sunscreen use. Safety issues such as seatbelt and bike helmet use. Physical exam Your health care provider will check your: Height and weight. These may be used to calculate your BMI (body mass index). BMI is a measurement that tells if you are at a healthy weight. Waist circumference. This measures the distance around your waistline. This measurement also tells if you are at a healthy weight and may help predict your risk of certain diseases, such as type 2 diabetes and high blood pressure. Heart rate and blood pressure. Body temperature. Skin for abnormal spots. What immunizations do I need?  Vaccines are usually given at various ages, according to a schedule. Your health care provider will recommend vaccines for you based on your age, medical history, and lifestyle or other factors, such as travel or where you work. What tests do I need? Screening Your health care provider may recommend screening tests for certain conditions. This may include: Lipid and cholesterol levels. Diabetes screening. This is done by checking your blood sugar (glucose) after you have not eaten for a while (fasting). Hepatitis C test. Hepatitis B test. HIV (human  immunodeficiency virus) test. STI (sexually transmitted infection) testing, if you are at risk. Lung cancer screening. Colorectal cancer screening. Prostate cancer screening. Abdominal aortic aneurysm (AAA) screening. You may need this if you are a current or former smoker. Talk with your health care provider about your test results, treatment options, and if necessary, the need for more tests. Follow these instructions at home: Eating and drinking  Eat a diet that includes fresh fruits and vegetables, whole grains, lean protein, and low-fat dairy products. Limit your intake of foods with high amounts of sugar, saturated fats, and salt. Take vitamin and mineral supplements as recommended by your health care provider. Do not drink alcohol if your health care provider tells you not to drink. If you drink alcohol: Limit how much you have to 0-2 drinks a day. Know how much alcohol is in your drink. In the [place] one drink equals one 12 oz bottle of beer (355 mL), one 5 oz glass of wine (148 mL), or one 1 oz glass of hard liquor (44 mL). Lifestyle Brush your teeth every morning and night with fluoride toothpaste. Floss one time each day. Exercise for at least 30 minutes 5 or more days each week. Do not use any products that contain nicotine or tobacco. These products include cigarettes, chewing tobacco, and vaping devices, such as e-cigarettes. If you need help quitting, ask your health care provider. Do not use drugs. If you are sexually active, practice safe sex. Use a condom or other form of protection to prevent STIs. Take aspirin only as told by your health care provider. Make sure that you understand how much to take and what form to take. Work with your health care provider to find out whether it is safe   and beneficial for you to take aspirin daily. Ask your health care provider if you need to take a cholesterol-lowering medicine (statin). Find healthy ways to manage stress, such  as: Meditation, yoga, or listening to music. Journaling. Talking to a trusted person. Spending time with friends and family. Safety Always wear your seat belt while driving or riding in a vehicle. Do not drive: If you have been drinking alcohol. Do not ride with someone who has been drinking. When you are tired or distracted. While texting. If you have been using any mind-altering substances or drugs. Wear a helmet and other protective equipment during sports activities. If you have firearms in your house, make sure you follow all gun safety procedures. Minimize exposure to UV radiation to reduce your risk of skin cancer. What's next? Visit your health care provider once a year for an annual wellness visit. Ask your health care provider how often you should have your eyes and teeth checked. Stay up to date on all vaccines. This information is not intended to replace advice given to you by your health care provider. Make sure you discuss any questions you have with your health care provider. Document Revised: 10/06/2020 Document Reviewed: 10/06/2020 Elsevier Patient Education  2023 Elsevier Inc.  

## 2021-08-15 ENCOUNTER — Encounter: Payer: Self-pay | Admitting: Family Medicine

## 2021-08-15 ENCOUNTER — Ambulatory Visit (INDEPENDENT_AMBULATORY_CARE_PROVIDER_SITE_OTHER): Payer: Medicare Other | Admitting: Family Medicine

## 2021-08-15 VITALS — BP 114/64 | HR 70 | Resp 16

## 2021-08-15 DIAGNOSIS — I700 Atherosclerosis of aorta: Secondary | ICD-10-CM | POA: Diagnosis not present

## 2021-08-15 DIAGNOSIS — Z0000 Encounter for general adult medical examination without abnormal findings: Secondary | ICD-10-CM | POA: Diagnosis not present

## 2021-08-15 DIAGNOSIS — E559 Vitamin D deficiency, unspecified: Secondary | ICD-10-CM | POA: Diagnosis not present

## 2021-08-15 DIAGNOSIS — E440 Moderate protein-calorie malnutrition: Secondary | ICD-10-CM | POA: Diagnosis not present

## 2021-08-15 DIAGNOSIS — E538 Deficiency of other specified B group vitamins: Secondary | ICD-10-CM

## 2021-08-15 DIAGNOSIS — D649 Anemia, unspecified: Secondary | ICD-10-CM

## 2021-08-15 LAB — COMPLETE METABOLIC PANEL WITH GFR
AG Ratio: 1.3 (calc) (ref 1.0–2.5)
ALT: 14 U/L (ref 9–46)
AST: 15 U/L (ref 10–35)
Albumin: 3.5 g/dL — ABNORMAL LOW (ref 3.6–5.1)
Alkaline phosphatase (APISO): 126 U/L (ref 35–144)
BUN/Creatinine Ratio: 43 (calc) — ABNORMAL HIGH (ref 6–22)
BUN: 32 mg/dL — ABNORMAL HIGH (ref 7–25)
CO2: 29 mmol/L (ref 20–32)
Calcium: 9.2 mg/dL (ref 8.6–10.3)
Chloride: 109 mmol/L (ref 98–110)
Creat: 0.74 mg/dL (ref 0.70–1.28)
Globulin: 2.6 g/dL (calc) (ref 1.9–3.7)
Glucose, Bld: 102 mg/dL — ABNORMAL HIGH (ref 65–99)
Potassium: 3.8 mmol/L (ref 3.5–5.3)
Sodium: 144 mmol/L (ref 135–146)
Total Bilirubin: 0.3 mg/dL (ref 0.2–1.2)
Total Protein: 6.1 g/dL (ref 6.1–8.1)
eGFR: 93 mL/min/{1.73_m2} (ref >=60)

## 2021-08-15 LAB — IRON,TIBC AND FERRITIN PANEL
%SAT: 27 % (calc) (ref 20–48)
Ferritin: 28 ng/mL (ref 24–380)
Iron: 79 ug/dL (ref 50–180)
TIBC: 288 mcg/dL (calc) (ref 250–425)

## 2021-08-15 LAB — VITAMIN D 25 HYDROXY (VIT D DEFICIENCY, FRACTURES): Vit D, 25-Hydroxy: 50 ng/mL (ref 30–100)

## 2021-08-15 LAB — CBC WITH DIFFERENTIAL/PLATELET
Absolute Monocytes: 391 cells/uL (ref 200–950)
Basophils Absolute: 19 cells/uL (ref 0–200)
Basophils Relative: 0.3 %
Eosinophils Absolute: 62 cells/uL (ref 15–500)
Eosinophils Relative: 1 %
HCT: 34.6 % — ABNORMAL LOW (ref 38.5–50.0)
Hemoglobin: 11.8 g/dL — ABNORMAL LOW (ref 13.2–17.1)
Lymphs Abs: 862 cells/uL (ref 850–3900)
MCH: 32.7 pg (ref 27.0–33.0)
MCHC: 34.1 g/dL (ref 32.0–36.0)
MCV: 95.8 fL (ref 80.0–100.0)
MPV: 10 fL (ref 7.5–12.5)
Monocytes Relative: 6.3 %
Neutro Abs: 4867 cells/uL (ref 1500–7800)
Neutrophils Relative %: 78.5 %
Platelets: 176 10*3/uL (ref 140–400)
RBC: 3.61 10*6/uL — ABNORMAL LOW (ref 4.20–5.80)
RDW: 12.7 % (ref 11.0–15.0)
Total Lymphocyte: 13.9 %
WBC: 6.2 10*3/uL (ref 3.8–10.8)

## 2021-08-15 LAB — LIPID PANEL
Cholesterol: 94 mg/dL (ref <200)
HDL: 47 mg/dL (ref >=40)
LDL Cholesterol (Calc): 34 mg/dL (calc)
Non-HDL Cholesterol (Calc): 47 mg/dL (calc) (ref <130)
Total CHOL/HDL Ratio: 2 (calc) (ref <5.0)
Triglycerides: 53 mg/dL (ref <150)

## 2021-08-15 LAB — B12 AND FOLATE PANEL
Folate: >24 ng/mL
Vitamin B-12: 495 pg/mL (ref 200–1100)

## 2021-08-16 ENCOUNTER — Other Ambulatory Visit: Payer: Self-pay

## 2021-08-16 DIAGNOSIS — D649 Anemia, unspecified: Secondary | ICD-10-CM

## 2021-08-16 DIAGNOSIS — D509 Iron deficiency anemia, unspecified: Secondary | ICD-10-CM

## 2021-08-16 MED ORDER — IRON (FERROUS SULFATE) 325 (65 FE) MG PO TABS: 325.0000 mg | ORAL_TABLET | Freq: Every day | ORAL | 3 refills | Status: DC

## 2021-08-17 ENCOUNTER — Ambulatory Visit: Payer: Self-pay

## 2021-08-17 NOTE — Telephone Encounter (Signed)
?  [facility] reports pt. Is on Fusion Plus from Dr. Sherri Sear, which has 130 mg total iron in it. Asking if PCP still wants pt. To take Ferrous sulfate ordered 08/15/21. Please advise pharmacy. ? ? ?Answer Assessment - Initial Assessment Questions ?1. NAME of MEDICATION: "What medicine are you calling about?" ?    Ferrous sulfate ?2. QUESTION: "What is your question?" (e.g., double dose of medicine, side effect) ?    N/a ?3. PRESCRIBING HCP: "Who prescribed it?" Reason: if prescribed by specialist, call should be referred to that group. ?    Dr. Ancil Boozer ?4. SYMPTOMS: "Do you have any symptoms?" ?    N/a ?5. SEVERITY: If symptoms are present, ask "Are they mild, moderate or severe?" ?    N/a ?6. PREGNANCY:  "Is there any chance that you are pregnant?" "When was your last menstrual period?" ?    N/a ? ?Protocols used: Medication Question Call-A-AH ? ?

## 2021-08-17 NOTE — Telephone Encounter (Signed)
Debbie with [facility] called for clarification on Rx for Iron, Ferrous Sulfate, 325 (65 Fe) MG TABS. Cb# [phone]  ? ?Line busy. ?

## 2021-08-22 ENCOUNTER — Other Ambulatory Visit: Payer: Self-pay | Admitting: Gastroenterology

## 2021-08-23 ENCOUNTER — Ambulatory Visit: Payer: Commercial Managed Care - HMO

## 2021-08-23 NOTE — Progress Notes (Signed)
? ? ?08/24/2021 ?8:14 AM  ? ?[name] ?[birth year] ?[id number] ? ?Referring provider: Steele Sizer, MD ?[address] ?[address] ?[place],  [place] [address] ? ?Chief Complaint  ?Patient presents with  ? Follow-up  ? ?Urological history: ?1.  History T1c intermediate risk prostate cancer ?-Gleason 3+4 diagnosed 2010 ?-Treated with brachytherapy 11/10/2008 ?-Last PSA 10/2015 <0.1 ?  ?2.  Chronic bacteriuria ?-Cystoscopy Dr. Elnoria Howard 2015 unremarkable, nonocclusive prostate or stricture ?  ?3.  Stone disease ?- CT 2019 nonobstructing right mid ureteral calculus ?-Ureteroscopic stone removal 08/14/2016 ?  ?4.  Renal cysts ?-CTU 2019 bilateral renal cysts with 2 indeterminate right renal lesions (18 mm interpolar left kidney and 15 mm anterior right kidney) ?-MRI 07/2017 showed the left renal lesion was not present and was a perfusion anomaly; right renal lesion a Bosniak 2 cyst ?-CTU 2022 - There is 1.9 cm smooth marginated low-density lesion in the midportion of left kidney. There is 2.2 cm cyst in the anterior midportion of right kidney. There is 2.3 cm exophytic lesion in the anterior midportion of right kidney, possibly cyst  ? ?HPI: ?[name] is a 81 y.o. male who presents today for recheck on left epididymitis, bilateral nephrolithiasis and balanitis with caregiver, Hassan Rowan. ? ?S/P Left hemiscrotal exploration 07/05/2021 ? ?History obtained from staff member, Hassan Rowan. ? ?He has not complained of left testicular pain.  There has been no drainage from the site.  He does have foul-smelling urine. ? ?He has been complaining of pain in his right side.  He has passed 2 stones within the last few months. ? ?Patient denies any modifying or aggravating factors.  Patient denies any gross hematuria, dysuria or suprapubic/flank pain.  Patient denies any fevers, chills, nausea or vomiting.   ? ? ?PMH: ?Past Medical History:  ?Diagnosis Date  ? Anxiety   ? Atrophy, disuse, muscle   ? BPH (benign prostatic hyperplasia)    ? Cerebral palsy ([place])   ? Eczema   ? Elevated lipids   ? Elevated PSA   ? Exotropia   ? Hyperlipidemia   ? Kidney stone   ? Leukopenia   ? Lower extremity edema   ? lymphedema  ? Mental retardation   ? Over weight   ? Prostate cancer ([place])   ? Venous insufficiency   ? Vitamin D deficiency   ? Wheelchair dependence   ? ? ?Surgical History: ?Past Surgical History:  ?Procedure Laterality Date  ? COLONOSCOPY WITH PROPOFOL N/A 05/10/2020  ? Procedure: COLONOSCOPY WITH PROPOFOL;  Surgeon: Lin Landsman, MD;  Location: [facility] ENDOSCOPY;  Service: Gastroenterology;  Laterality: N/A;  ? COLONOSCOPY WITH PROPOFOL N/A 05/12/2020  ? Procedure: COLONOSCOPY WITH PROPOFOL;  Surgeon: Lin Landsman, MD;  Location: [facility] ENDOSCOPY;  Service: Gastroenterology;  Laterality: N/A;  ? CYSTOSCOPY/URETEROSCOPY/HOLMIUM LASER/STENT PLACEMENT Right 08/14/2017  ? Procedure: CYSTOSCOPY/URETEROSCOPY/HOLMIUM LASER/STENT PLACEMENT;  Surgeon: Abbie Sons, MD;  Location: [facility] ORS;  Service: Urology;  Laterality: Right;  ? ESOPHAGOGASTRODUODENOSCOPY (EGD) WITH PROPOFOL N/A 05/12/2020  ? Procedure: ESOPHAGOGASTRODUODENOSCOPY (EGD) WITH PROPOFOL;  Surgeon: Lin Landsman, MD;  Location: [facility] ENDOSCOPY;  Service: Gastroenterology;  Laterality: N/A;  ? INCISION AND DRAINAGE ABSCESS Left 07/05/2021  ? Procedure: INCISION AND DRAINAGE SCROTAL WALL ABSCESS;  Surgeon: Abbie Sons, MD;  Location: [facility] ORS;  Service: Urology;  Laterality: Left;  ? leg circulation surgery Right   ? PROSTATE SURGERY  11/10/2008  ? BRACHYTHERAPY  ? SCROTAL EXPLORATION Left 07/05/2021  ? Procedure: SCROTUM EXPLORATION;  Surgeon: Bernardo Heater,  Ronda Fairly, MD;  Location: [facility] ORS;  Service: Urology;  Laterality: Left;  ? ? ?Home Medications:  ?Allergies as of 08/24/2021   ?No Known Allergies ?  ? ?  ?Medication List  ?  ? ?  ? Accurate as of Aug 24, 2021 11:59 PM. If you have any questions, ask your nurse or doctor.  ?  ?  ? ?  ? ?alfuzosin 10 MG 24 hr tablet ?Commonly known  as: UROXATRAL ?TAKE 1 TABLET BY MOUTH DAILY ?  ?ALPRAZolam 0.25 MG tablet ?Commonly known as: Duanne Moron ?TAKE ONE TABLET BY MOUTH 2 TIMES A DAY AS NEEDED FOR ANXIETY (WHEN BLOOD PRESSURE IS VERY HIGH OR ANXIOUS) ?What changed:  ?how much to take ?how to take this ?when to take this ?reasons to take this ?additional instructions ?  ?Artificial Tears 0.2-0.2-1 % Soln ?Generic drug: Glycerin-Hypromellose-PEG 400 ?PLACE 2 DROPS INTO EYE 2 TIMES A DAY AS NEEDED ?  ?atorvastatin 40 MG tablet ?Commonly known as: LIPITOR ?TAKE 1 TABLET BY MOUTH EVERY EVENING ?  ?augmented betamethasone dipropionate 0.05 % cream ?Commonly known as: DIPROLENE-AF ?APPLY TOPICALLY AS DIRECTED TO RASH AS NEEDED ?  ?Calcium Carb-Cholecalciferol 500-10 MG-MCG Tabs ?TAKE 1 TABLET BY MOUTH 2 TIMES A DAY ?  ?Cranberry Juice Powder 425 MG Caps ?TAKE 1 CAPSULE BY MOUTH ONCE DAILY ?  ?Depend Silhouette Briefs L/XL Misc ?1 each by Does not apply route 2 (two) times daily as needed. ?  ?Ensure Original Liqd ?Take 1 Can by mouth 3 (three) times daily. ?  ?GNP Vitamin C 500 MG tablet ?Generic drug: ascorbic acid ?TAKE ONE TABLET BY MOUTH ONCE DAILY ?  ?Iron (Ferrous Sulfate) 325 (65 Fe) MG Tabs ?Take 325 mg by mouth daily. ?  ?lubiprostone 24 MCG capsule ?Commonly known as: AMITIZA ?TAKE 1 CAPSULE BY MOUTH 2 TIMES DAILY WITH A MEAL ?  ?Medical Compression Stockings Misc ?1 Units by Does not apply route daily. ?  ?nystatin cream ?Commonly known as: MYCOSTATIN ?APPLY 1 APPLICATION TOPICALLY 2 TIMES DAILY ?  ?sertraline 100 MG tablet ?Commonly known as: ZOLOFT ?TAKE 1 TABLET BY MOUTH DAILY ?  ?SYSTANE ULTRA OP ?Place 1 drop into both eyes 2 (two) times daily as needed (dry eyes). ?  ? ?  ? ? ?Allergies: No Known Allergies ? ?Family History: ?Family History  ?Problem Relation Age of Onset  ? Heart attack Brother   ? Heart disease Other   ? ? ?Social History:  reports that he has never smoked. He has never used smokeless tobacco. He reports that he does not drink  alcohol and does not use drugs. ? ?ROS: ?Pertinent ROS in HPI ? ?Physical Exam: ?BP (!) 92/56   Macias 65   ?Constitutional:  Well nourished. Alert and oriented, No acute distress. ?HEENT: [place] AT, moist mucus membranes.  Trachea midline ?Cardiovascular: No clubbing, cyanosis, or edema. ?Respiratory: Normal respiratory effort, no increased work of breathing. ?GU: No CVA tenderness.  No bladder fullness or masses.  Patient with uncircumcised phallus. Foreskin easily retracted  Urethral meatus is patent.  No penile discharge. No penile lesions or rashes. Scrotum without lesions, cysts, rashes and/or edema.  Testicles are located scrotally bilaterally. No masses are appreciated in the testicles. Left and right epididymis are normal.  There is an indurated, non painful ovoid mass in the left testicle sac, no crepitus, erythema or fluctuance is noted.  ?Neurologic: Grossly intact, no focal deficits, moving all 4 extremities. ?Psychiatric: Normal mood and affect.  ? ?Laboratory Data: ?Urinalysis  and urine culture is pending ? ?Pertinent Imaging: ?N/A   ? ?Assessment & Plan:   ? ?1.  Left epididymitis ?-s/p left hemiscrotal exploration ?-We will schedule follow-up scrotal ultrasound ? ?2. Right side pain ?-UA  ?-urine culture-staff will bring in a specimen at a later date ?-CT renal stone study  ? ?Return for pending scrotal US, ct renal stone study and urine culture results . ? ?These notes generated with voice recognition software. I apologize for typographical errors. ? ?Victorino Fatzinger, PA-C ? ?[address] ?[place], [place] [address] ?[phone]  ?

## 2021-08-24 ENCOUNTER — Encounter: Payer: Self-pay | Admitting: Urology

## 2021-08-24 ENCOUNTER — Ambulatory Visit (INDEPENDENT_AMBULATORY_CARE_PROVIDER_SITE_OTHER): Payer: Medicare Other | Admitting: Urology

## 2021-08-24 VITALS — BP 92/56 | HR 63

## 2021-08-24 DIAGNOSIS — N200 Calculus of kidney: Secondary | ICD-10-CM

## 2021-08-24 DIAGNOSIS — N451 Epididymitis: Secondary | ICD-10-CM

## 2021-08-24 DIAGNOSIS — Z87442 Personal history of urinary calculi: Secondary | ICD-10-CM

## 2021-08-24 DIAGNOSIS — N492 Inflammatory disorders of scrotum: Secondary | ICD-10-CM

## 2021-08-24 NOTE — Patient Instructions (Signed)
Please acquire urine for urinalysis and urine culture. ? ?We will also get we will schedule a CT scan to evaluate his current stone burden. ? ?We also schedule scrotal ultrasound for surveillance of scrotal abscess. ?

## 2021-08-26 ENCOUNTER — Other Ambulatory Visit: Payer: Medicare Other

## 2021-08-26 ENCOUNTER — Other Ambulatory Visit: Payer: Self-pay

## 2021-08-26 DIAGNOSIS — R310 Gross hematuria: Secondary | ICD-10-CM

## 2021-08-26 LAB — URINALYSIS, COMPLETE
Bilirubin, UA: NEGATIVE
Glucose, UA: NEGATIVE
Ketones, UA: NEGATIVE
Nitrite, UA: NEGATIVE
Specific Gravity, UA: 1.01 (ref 1.005–1.030)
Urobilinogen, Ur: 1 mg/dL (ref 0.2–1.0)
pH, UA: >9 — ABNORMAL HIGH (ref 5.0–7.5)

## 2021-08-26 LAB — MICROSCOPIC EXAMINATION: WBC, UA: >30 /hpf — AB (ref 0–5)

## 2021-08-30 ENCOUNTER — Other Ambulatory Visit: Payer: Self-pay | Admitting: Urology

## 2021-08-30 LAB — CULTURE, URINE COMPREHENSIVE

## 2021-08-30 MED ORDER — AMOXICILLIN-POT CLAVULANATE 875-125 MG PO TABS: 1.0000 | ORAL_TABLET | Freq: Two times a day (BID) | ORAL | 0 refills | Status: DC

## 2021-08-31 ENCOUNTER — Telehealth: Payer: Self-pay

## 2021-08-31 NOTE — Telephone Encounter (Signed)
Notified pts caregiver [name] of positive urine culture and ABX Rx. Called scheduling to see if pt is ready to schedule, pt is approved and ready to schedule. Will have caregiver contact scheduling to coordinate pts CT scan.  ?

## 2021-08-31 NOTE — Telephone Encounter (Signed)
-----   Message from Nori Riis, PA-C sent at 08/30/2021  9:26 PM EDT ----- ?Please have Bill start Augmentin 875/125, twice daily for seven days for his positive urine culture.  I have sent the prescription into his pharmacy.  I also ordered a CT scan on him which has not been scheduled.  Would you look into why is hasn't been scheduled yet? ?

## 2021-09-12 ENCOUNTER — Ambulatory Visit
Admission: RE | Admit: 2021-09-12 | Discharge: 2021-09-12 | Disposition: A | Discharge status: Home / Self Care | Payer: Medicare Other | Source: Ambulatory Visit | Attending: Urology | Admitting: Urology

## 2021-09-12 ENCOUNTER — Telehealth: Payer: Self-pay

## 2021-09-12 DIAGNOSIS — N492 Inflammatory disorders of scrotum: Secondary | ICD-10-CM | POA: Diagnosis present

## 2021-09-12 DIAGNOSIS — N200 Calculus of kidney: Secondary | ICD-10-CM | POA: Diagnosis present

## 2021-09-12 NOTE — Telephone Encounter (Signed)
Copied from [place] [id number]. Topic: General - Other >> Sep 12, 2021 12:04 PM McGill, Nelva Bush wrote: Reason for CRM: Elmo Putt from [employer] stated that she sent a fax on May 12 for a repair to a manual wheelchair.  Elmo Putt is resending the fax today.     [phone]  Fax - [phone]

## 2021-09-13 ENCOUNTER — Other Ambulatory Visit: Payer: Self-pay | Admitting: Urology

## 2021-09-13 DIAGNOSIS — N201 Calculus of ureter: Secondary | ICD-10-CM

## 2021-09-13 DIAGNOSIS — N200 Calculus of kidney: Secondary | ICD-10-CM

## 2021-09-13 DIAGNOSIS — N3289 Other specified disorders of bladder: Secondary | ICD-10-CM

## 2021-09-13 NOTE — Progress Notes (Incomplete)
Surgical Physician Order Form [facility] [place]  * Scheduling expectation : Next available  with Dr. Bernardo Heater   *Length of Case:   *Clearance needed: no  *Anticoagulation Instructions: Hold all anticoagulants  *Aspirin Instructions: Hold Aspirin  *Post-op visit Date/Instructions:   TBD  *Diagnosis:   left distal ureteral stone with hydronephrosis, bilateral renal stones, right ureteral dilation and markedly trabeculated urinary bladder   *Procedure: cystoscopy, bilateral retrogrades, bilateral ureteroscopy with bilateral laser lithotripsy and bilateral ureteral stent placement  Additional orders: N/A  -Admit type: OUTpatient  -Anesthesia: General  -VTE Prophylaxis Standing Order SCD's       Other:   -Standing Lab Orders Per Anesthesia    Lab other: None  -Standing Test orders EKG/Chest x-ray per Anesthesia       Test other:   - Medications:  Ancef 2gm IV  -Other orders:  N/A

## 2021-09-13 NOTE — Progress Notes (Addendum)
09/14/2021 4:43 PM   [name] [birth year] [id number]  Referring provider: Steele Sizer, MD [address] [place],  [place] [address]  Urological history: 1.  History T1c intermediate risk prostate cancer -Gleason 3+4 diagnosed 2010 -Treated with brachytherapy 11/10/2008 -Last PSA 10/2015 <0.1   2.  Chronic bacteriuria -Cystoscopy Dr. Elnoria Howard 2015 unremarkable, nonocclusive prostate or stricture   3.  Stone disease - CT 2019 nonobstructing right mid ureteral calculus -Ureteroscopic stone removal 08/14/2016   4.  Renal cysts -CTU 2019 bilateral renal cysts with 2 indeterminate right renal lesions (18 mm interpolar left kidney and 15 mm anterior right kidney) -MRI 07/2017 showed the left renal lesion was not present and was a perfusion anomaly; right renal lesion a Bosniak 2 cyst -CTU 2022 - There is 1.9 cm smooth marginated low-density lesion in the midportion of left kidney. There is 2.2 cm cyst in the anterior midportion of right kidney. There is 2.3 cm exophytic lesion in the anterior midportion of right kidney, possibly cyst   HPI: [name] is a 81 y.o. male who presents today for CT and scrotal ultrasound results with Hassan Rowan, his caregiver.   Scrotal ultrasound performed 2 days ago shows that the scrotal abscess from previous exam has now resolved.  CT renal stone study performed 2 days ago noted bladder stones with a highly trabeculated bladder, dilation of the right ureter which may represent a recently passed stone versus high-pressure bladder, 14 mm left distal ureteral stone with hydronephrosis.  Bladder stones are actually prostate calcifications.   [name] appears in good spirits.  He is very communicative during this visit and denies any issues with pain.  Hassan Rowan did state he passed a small stone this morning.  Patient denies any modifying or aggravating factors.  Patient denies any gross hematuria, dysuria or suprapubic/flank pain.  Patient  denies any fevers, chills, nausea or vomiting.    CATH UA nitrate positive, greater than 30 WBCs, 11-30 RBCs and many bacteria.    PVR 200 cc  PMH: Past Medical History:  Diagnosis Date   Anxiety    Atrophy, disuse, muscle    BPH (benign prostatic hyperplasia)    Cerebral palsy (HCC)    Eczema    Elevated lipids    Elevated PSA    Exotropia    Hyperlipidemia    Kidney stone    Leukopenia    Lower extremity edema    lymphedema   Mental retardation    Over weight    Prostate cancer ([place])    Venous insufficiency    Vitamin D deficiency    Wheelchair dependence     Surgical History: Past Surgical History:  Procedure Laterality Date   COLONOSCOPY WITH PROPOFOL N/A 05/10/2020   Procedure: COLONOSCOPY WITH PROPOFOL;  Surgeon: Lin Landsman, MD;  Location: [facility] ENDOSCOPY;  Service: Gastroenterology;  Laterality: N/A;   COLONOSCOPY WITH PROPOFOL N/A 05/12/2020   Procedure: COLONOSCOPY WITH PROPOFOL;  Surgeon: Lin Landsman, MD;  Location: [facility] ENDOSCOPY;  Service: Gastroenterology;  Laterality: N/A;   CYSTOSCOPY/URETEROSCOPY/HOLMIUM LASER/STENT PLACEMENT Right 08/14/2017   Procedure: CYSTOSCOPY/URETEROSCOPY/HOLMIUM LASER/STENT PLACEMENT;  Surgeon: Abbie Sons, MD;  Location: [facility] ORS;  Service: Urology;  Laterality: Right;   ESOPHAGOGASTRODUODENOSCOPY (EGD) WITH PROPOFOL N/A 05/12/2020   Procedure: ESOPHAGOGASTRODUODENOSCOPY (EGD) WITH PROPOFOL;  Surgeon: Lin Landsman, MD;  Location: [facility] ENDOSCOPY;  Service: Gastroenterology;  Laterality: N/A;   INCISION AND DRAINAGE ABSCESS Left 07/05/2021   Procedure: INCISION AND DRAINAGE SCROTAL WALL ABSCESS;  Surgeon: Abbie Sons, MD;  Location: [facility] ORS;  Service: Urology;  Laterality: Left;   leg circulation surgery Right    PROSTATE SURGERY  11/10/2008   BRACHYTHERAPY   SCROTAL EXPLORATION Left 07/05/2021   Procedure: SCROTUM EXPLORATION;  Surgeon: Abbie Sons, MD;  Location: [facility] ORS;  Service: Urology;   Laterality: Left;    Home Medications:  Allergies as of 09/14/2021   No Known Allergies      Medication List        Accurate as of Sep 14, 2021  4:43 PM. If you have any questions, ask your nurse or doctor.          alfuzosin 10 MG 24 hr tablet Commonly known as: UROXATRAL TAKE 1 TABLET BY MOUTH DAILY   ALPRAZolam 0.25 MG tablet Commonly known as: XANAX TAKE ONE TABLET BY MOUTH 2 TIMES A DAY AS NEEDED FOR ANXIETY (WHEN BLOOD PRESSURE IS VERY HIGH OR ANXIOUS) What changed:  how much to take how to take this when to take this reasons to take this additional instructions   amoxicillin-clavulanate 875-125 MG tablet Commonly known as: AUGMENTIN Take 1 tablet by mouth every 12 (twelve) hours.   Artificial Tears 0.2-0.2-1 % Soln Generic drug: Glycerin-Hypromellose-PEG 400 PLACE 2 DROPS INTO EYE 2 TIMES A DAY AS NEEDED   atorvastatin 40 MG tablet Commonly known as: LIPITOR TAKE 1 TABLET BY MOUTH EVERY EVENING   augmented betamethasone dipropionate 0.05 % cream Commonly known as: DIPROLENE-AF APPLY TOPICALLY AS DIRECTED TO RASH AS NEEDED   Calcium Carb-Cholecalciferol 500-10 MG-MCG Tabs TAKE 1 TABLET BY MOUTH 2 TIMES A DAY   Cranberry Juice Powder 425 MG Caps TAKE 1 CAPSULE BY MOUTH ONCE DAILY   Depend Silhouette Briefs L/XL Misc 1 each by Does not apply route 2 (two) times daily as needed.   Ensure Original Liqd Take 1 Can by mouth 3 (three) times daily.   GNP Vitamin C 500 MG tablet Generic drug: ascorbic acid TAKE ONE TABLET BY MOUTH ONCE DAILY   Iron (Ferrous Sulfate) 325 (65 Fe) MG Tabs Take 325 mg by mouth daily.   lubiprostone 24 MCG capsule Commonly known as: AMITIZA TAKE 1 CAPSULE BY MOUTH 2 TIMES DAILY WITH A MEAL   Medical Compression Stockings Misc 1 Units by Does not apply route daily.   nystatin cream Commonly known as: MYCOSTATIN APPLY 1 APPLICATION TOPICALLY 2 TIMES DAILY   sertraline 100 MG tablet Commonly known as: ZOLOFT TAKE 1  TABLET BY MOUTH DAILY   SYSTANE ULTRA OP Place 1 drop into both eyes 2 (two) times daily as needed (dry eyes).        Allergies: No Known Allergies  Family History: Family History  Problem Relation Age of Onset   Heart attack Brother    Heart disease Other     Social History:  reports that he has never smoked. He has never used smokeless tobacco. He reports that he does not drink alcohol and does not use drugs.  ROS: Pertinent ROS in HPI  Physical Exam: BP 101/60   Pulse (!) 56   Constitutional:  Well nourished. Alert and oriented, No acute distress. HEENT: [place] AT, moist mucus membranes.  Trachea midline Cardiovascular: No clubbing, cyanosis, or edema. Respiratory: Normal respiratory effort, no increased work of breathing. GU: No CVA tenderness.  No bladder fullness or masses.  Patient with uncircumcised phallus.  Foreskin easily retracted  Urethral meatus is patent.  No penile discharge. No penile lesions or rashes. Scrotum without  lesions, cysts, rashes and/or edema.   Neurologic: Grossly intact, no focal deficits, moving all 4 extremities. Psychiatric: Normal mood and affect.   Laboratory Data: Urinalysis See Epic I have reviewed the labs.   Pertinent Imaging: CLINICAL DATA:  Follow-up scrotal abscess. Prior incision and drainage.   EXAM: SCROTAL ULTRASOUND   DOPPLER ULTRASOUND OF THE TESTICLES   TECHNIQUE: Complete ultrasound examination of the testicles, epididymis, and other scrotal structures was performed. Color and spectral Doppler ultrasound were also utilized to evaluate blood flow to the testicles.   COMPARISON:  Left scrotal ultrasound 05/26/2021, 05/17/2021, noncontrast abdominopelvic CT performed same day   FINDINGS: Technically challenging and limited exam due to patient's medical condition.   Right testicle   Measurements: 5 x 2.0 x 2.5 cm. Testicle is located in the inguinal canal. Echotexture is mildly heterogeneous. No mass  or microlithiasis visualized.   Left testicle   Measurements: 3.5 x 1.9 x 3 cm. Echotexture is heterogeneous. The previous intra testicular cyst is not well demonstrated on the current exam. No mass or microlithiasis visualized.   Right epididymis:  Normal in size and appearance.   Left epididymis:  Not well delineated.   Hydrocele:  Present on the right.  None on the left.   Varicocele:  None visualized.   Pulsed Doppler interrogation of both testes demonstrates normal low resistance arterial and venous waveforms bilaterally.   Other: The heterogeneous avascular extratesticular fluid collection is not discretely seen on the current exam.   IMPRESSION: 1. Resolution of the previous left scrotal abscess since prior exam. 2. Mildly heterogeneous testicular echotexture, nonspecific. The right testis is again noted to be within the inguinal canal.     Electronically Signed   By: Keith Rake M.D.   On: 09/12/2021 16:45   CLINICAL DATA:  Flank pain, suspected kidney stone.   EXAM: CT ABDOMEN AND PELVIS WITHOUT CONTRAST   TECHNIQUE: Multidetector CT imaging of the abdomen and pelvis was performed following the standard protocol without IV contrast.   RADIATION DOSE REDUCTION: This exam was performed according to the departmental dose-optimization program which includes automated exposure control, adjustment of the mA and/or kV according to patient size and/or use of iterative reconstruction technique.   COMPARISON:  March 28, 2021.   FINDINGS: Lower chest: Basilar atelectasis.  No effusion.  No consolidation.   Calcified coronary artery disease.   Hepatobiliary: Smooth hepatic contours. No visible hepatic lesion on noncontrast imaging. No pericholecystic stranding or distension of the gallbladder or gross distension of the biliary tree.   Pancreas: Signs of pancreatic atrophy.  No adjacent inflammation.   Spleen: Normal.   Adrenals/Urinary Tract: Adrenal  glands are normal.   Renal cortical scarring which is greatest on the RIGHT. Renal calculi in the RIGHT kidney are similar to slightly increased compared to previous imaging as are renal calculi on the LEFT. For instance, a 17 x 3 mm calculus in the RIGHT renal pelvis previously 14 mm greatest axial dimension, slight increase in size and number of smaller calculi, greatest about lower pole infundibular collecting system elements and calices.   RIGHT ureter with mild distension without visible calculus.   Large distal LEFT ureteral calculus measuring 15 x 8 mm resulting in moderate hydronephrosis and hydroureter.   Markedly trabeculated urinary bladder is under distended limiting assessment with circumferential bladder wall thickening and numerous bladder calculi not present on previous imaging at least 4-5 bladder calculi. Largest approximately 9 mm.   Cysts of the bilateral kidneys. Exophytic lesion on  the RIGHT is not entirely homogeneous but is unchanged measuring approximately 17-18 mm. Other cystic lesions with homogeneous water density.   Stomach/Bowel: No acute small bowel process. Appendix not visible but no secondary signs to suggest acute appendicitis. Large volume stool in the colon and particularly within the rectum. Mild perirectal stranding.   Vascular/Lymphatic: Normal caliber of the abdominal aorta. Scattered aortic atherosclerosis. No abdominal or pelvic lymphadenopathy.   Reproductive: Unremarkable by CT.   Other: No ascites   Musculoskeletal: Osteopenia. No acute or destructive bone process with spinal degenerative changes.   IMPRESSION: 1. Large distal LEFT ureteral calculus measuring 15 x 8 mm resulting in moderate hydronephrosis and hydroureter. 2. Markedly trabeculated urinary bladder is under distended limiting assessment with circumferential bladder wall thickening and numerous bladder calculi not present on previous imaging, at least 4-5 bladder  calculi. 3. Perivesical stranding and mild circumferential thickening may represent sequela of chronic bladder outlet obstruction or cystitis. There is also RIGHT ureteral distension which is new and may relate to a component of bladder outlet obstruction or inflammation or recent passage of calculus, no current evidence of ureteral calculus on the RIGHT. 4. Large volume stool in the colon and particularly within the rectum. Mild perirectal stranding. Correlate for any clinical evidence of fecal impaction and or stercoral colitis. 5. Calcified coronary artery disease. 6. Indeterminate RIGHT renal lesion may represent a cyst with mild hemorrhage or proteinaceous content. Consider follow-up imaging with renal sonogram on a nonemergent basis for further assessment as this finding is unchanged since December of 2022.   Aortic Atherosclerosis (ICD10-I70.0).     Electronically Signed   By: Zetta Bills M.D.   On: 09/12/2021 14:40 I have independently reviewed the films.  See HPI.  Bladder stones appear to be prostate calcifications versus bladder stones.  Assessment & Plan:   81 year old gentleman whose staff stated he was passing stone fragments underwent a CT scan on July 13, 2021 which demonstrated a 15 mm distal left ureteral calculus with moderate hydronephrosis, a markedly trabeculated urinary bladder, right ureteral distention and a large right renal stone.   -CATH UA grossly infected  -urine sent for culture -Started on Augmentin 875/125 while waiting culture results -He will be scheduled for cystoscopy, bilateral retrogrades, bilateral ureteroscopy with bilateral laser lithotripsy and bilateral ureteral stent placement - explained to the patient how the procedure is performed and the risks involved -informed the patient that they will have bilateral ureteral stents, which will remain in place for approximately 3-10 days and can be associated with flank pain, bladder pain,  dysuria, urgency, frequency, urinary leakage, and gross hematuria. -stents may be removed in the office with a cystoscope or patient may be instructed to remove the stents themselves by the string and that is decided on the day of the procedure -residual stones within the kidney or ureter may be present after the procedure and may need to have these addressed at a different encounter -injury to the ureter is the most common intra-operative risk, it may result in an open procedure to correct the defect -infection and bleeding are also risks -explained the risks of general anesthesia, such as: MI, CVA, paralysis, coma and/or death. -advised to contact our office or seek treatment in the ED if becomes febrile or pain/ vomiting are difficult control in order to arrange for emergent/urgent intervention -CATH UA grossly infected -Urine sent for culture   Patient will be scheduled for cystoscopy, bilateral retrogrades, bilateral ureteroscopy with bilateral stent placement.   These  notes generated with voice recognition software. I apologize for typographical errors.  Zara Council, PA-C  [facility] [address]  [place], [place] [address] [phone]

## 2021-09-13 NOTE — H&P (View-Only) (Signed)
09/14/2021 4:43 PM   [name] 1940/07/04 [id number]  Referring provider: Steele Sizer, MD [address] [place],  [place] [address]  Urological history: 1.  History T1c intermediate risk prostate cancer -Gleason 3+4 diagnosed 2010 -Treated with brachytherapy 11/10/2008 -Last PSA 10/2015 <0.1   2.  Chronic bacteriuria -Cystoscopy Dr. Elnoria Howard 2015 unremarkable, nonocclusive prostate or stricture   3.  Stone disease - CT 2019 nonobstructing right mid ureteral calculus -Ureteroscopic stone removal 08/14/2016   4.  Renal cysts -CTU 2019 bilateral renal cysts with 2 indeterminate right renal lesions (18 mm interpolar left kidney and 15 mm anterior right kidney) -MRI 07/2017 showed the left renal lesion was not present and was a perfusion anomaly; right renal lesion a Bosniak 2 cyst -CTU 2022 - There is 1.9 cm smooth marginated low-density lesion in the midportion of left kidney. There is 2.2 cm cyst in the anterior midportion of right kidney. There is 2.3 cm exophytic lesion in the anterior midportion of right kidney, possibly cyst   HPI: [name] is a 81 y.o. male who presents today for CT and scrotal ultrasound results with Hassan Rowan, his caregiver.   Scrotal ultrasound performed 2 days ago shows that the scrotal abscess from previous exam has now resolved.  CT renal stone study performed 2 days ago noted bladder stones with a highly trabeculated bladder, dilation of the right ureter which may represent a recently passed stone versus high-pressure bladder, 14 mm left distal ureteral stone with hydronephrosis.  Bladder stones are actually prostate calcifications.   [name] appears in good spirits.  He is very communicative during this visit and denies any issues with pain.  Hassan Rowan did state he passed a small stone this morning.  Patient denies any modifying or aggravating factors.  Patient denies any gross hematuria, dysuria or suprapubic/flank pain.  Patient  denies any fevers, chills, nausea or vomiting.    CATH UA nitrate positive, greater than 30 WBCs, 11-30 RBCs and many bacteria.    PVR 200 cc  PMH: Past Medical History:  Diagnosis Date   Anxiety    Atrophy, disuse, muscle    BPH (benign prostatic hyperplasia)    Cerebral palsy (HCC)    Eczema    Elevated lipids    Elevated PSA    Exotropia    Hyperlipidemia    Kidney stone    Leukopenia    Lower extremity edema    lymphedema   Mental retardation    Over weight    Prostate cancer ([place])    Venous insufficiency    Vitamin D deficiency    Wheelchair dependence     Surgical History: Past Surgical History:  Procedure Laterality Date   COLONOSCOPY WITH PROPOFOL N/A 05/10/2020   Procedure: COLONOSCOPY WITH PROPOFOL;  Surgeon: Lin Landsman, MD;  Location: [facility] ENDOSCOPY;  Service: Gastroenterology;  Laterality: N/A;   COLONOSCOPY WITH PROPOFOL N/A 05/12/2020   Procedure: COLONOSCOPY WITH PROPOFOL;  Surgeon: Lin Landsman, MD;  Location: [facility] ENDOSCOPY;  Service: Gastroenterology;  Laterality: N/A;   CYSTOSCOPY/URETEROSCOPY/HOLMIUM LASER/STENT PLACEMENT Right 08/14/2017   Procedure: CYSTOSCOPY/URETEROSCOPY/HOLMIUM LASER/STENT PLACEMENT;  Surgeon: Abbie Sons, MD;  Location: [facility] ORS;  Service: Urology;  Laterality: Right;   ESOPHAGOGASTRODUODENOSCOPY (EGD) WITH PROPOFOL N/A 05/12/2020   Procedure: ESOPHAGOGASTRODUODENOSCOPY (EGD) WITH PROPOFOL;  Surgeon: Lin Landsman, MD;  Location: [facility] ENDOSCOPY;  Service: Gastroenterology;  Laterality: N/A;   INCISION AND DRAINAGE ABSCESS Left 07/05/2021   Procedure: INCISION AND DRAINAGE SCROTAL WALL ABSCESS;  Surgeon: Abbie Sons, MD;  Location: [facility] ORS;  Service: Urology;  Laterality: Left;   leg circulation surgery Right    PROSTATE SURGERY  11/10/2008   BRACHYTHERAPY   SCROTAL EXPLORATION Left 07/05/2021   Procedure: SCROTUM EXPLORATION;  Surgeon: Abbie Sons, MD;  Location: [facility] ORS;  Service: Urology;   Laterality: Left;    Home Medications:  Allergies as of 09/14/2021   No Known Allergies      Medication List        Accurate as of Sep 14, 2021  4:43 PM. If you have any questions, ask your nurse or doctor.          alfuzosin 10 MG 24 hr tablet Commonly known as: UROXATRAL TAKE 1 TABLET BY MOUTH DAILY   ALPRAZolam 0.25 MG tablet Commonly known as: XANAX TAKE ONE TABLET BY MOUTH 2 TIMES A DAY AS NEEDED FOR ANXIETY (WHEN BLOOD PRESSURE IS VERY HIGH OR ANXIOUS) What changed:  how much to take how to take this when to take this reasons to take this additional instructions   amoxicillin-clavulanate 875-125 MG tablet Commonly known as: AUGMENTIN Take 1 tablet by mouth every 12 (twelve) hours.   Artificial Tears 0.2-0.2-1 % Soln Generic drug: Glycerin-Hypromellose-PEG 400 PLACE 2 DROPS INTO EYE 2 TIMES A DAY AS NEEDED   atorvastatin 40 MG tablet Commonly known as: LIPITOR TAKE 1 TABLET BY MOUTH EVERY EVENING   augmented betamethasone dipropionate 0.05 % cream Commonly known as: DIPROLENE-AF APPLY TOPICALLY AS DIRECTED TO RASH AS NEEDED   Calcium Carb-Cholecalciferol 500-10 MG-MCG Tabs TAKE 1 TABLET BY MOUTH 2 TIMES A DAY   Cranberry Juice Powder 425 MG Caps TAKE 1 CAPSULE BY MOUTH ONCE DAILY   Depend Silhouette Briefs L/XL Misc 1 each by Does not apply route 2 (two) times daily as needed.   Ensure Original Liqd Take 1 Can by mouth 3 (three) times daily.   GNP Vitamin C 500 MG tablet Generic drug: ascorbic acid TAKE ONE TABLET BY MOUTH ONCE DAILY   Iron (Ferrous Sulfate) 325 (65 Fe) MG Tabs Take 325 mg by mouth daily.   lubiprostone 24 MCG capsule Commonly known as: AMITIZA TAKE 1 CAPSULE BY MOUTH 2 TIMES DAILY WITH A MEAL   Medical Compression Stockings Misc 1 Units by Does not apply route daily.   nystatin cream Commonly known as: MYCOSTATIN APPLY 1 APPLICATION TOPICALLY 2 TIMES DAILY   sertraline 100 MG tablet Commonly known as: ZOLOFT TAKE 1  TABLET BY MOUTH DAILY   SYSTANE ULTRA OP Place 1 drop into both eyes 2 (two) times daily as needed (dry eyes).        Allergies: No Known Allergies  Family History: Family History  Problem Relation Age of Onset   Heart attack Brother    Heart disease Other     Social History:  reports that he has never smoked. He has never used smokeless tobacco. He reports that he does not drink alcohol and does not use drugs.  ROS: Pertinent ROS in HPI  Physical Exam: BP 101/60   Pulse (!) 56   Constitutional:  Well nourished. Alert and oriented, No acute distress. HEENT: [place] AT, moist mucus membranes.  Trachea midline Cardiovascular: No clubbing, cyanosis, or edema. Respiratory: Normal respiratory effort, no increased work of breathing. GU: No CVA tenderness.  No bladder fullness or masses.  Patient with uncircumcised phallus.  Foreskin easily retracted  Urethral meatus is patent.  No penile discharge. No penile lesions or rashes. Scrotum without  lesions, cysts, rashes and/or edema.   Neurologic: Grossly intact, no focal deficits, moving all 4 extremities. Psychiatric: Normal mood and affect.   Laboratory Data: Urinalysis See Epic I have reviewed the labs.   Pertinent Imaging: CLINICAL DATA:  Follow-up scrotal abscess. Prior incision and drainage.   EXAM: SCROTAL ULTRASOUND   DOPPLER ULTRASOUND OF THE TESTICLES   TECHNIQUE: Complete ultrasound examination of the testicles, epididymis, and other scrotal structures was performed. Color and spectral Doppler ultrasound were also utilized to evaluate blood flow to the testicles.   COMPARISON:  Left scrotal ultrasound 05/26/2021, 05/17/2021, noncontrast abdominopelvic CT performed same day   FINDINGS: Technically challenging and limited exam due to patient's medical condition.   Right testicle   Measurements: 5 x 2.0 x 2.5 cm. Testicle is located in the inguinal canal. Echotexture is mildly heterogeneous. No mass  or microlithiasis visualized.   Left testicle   Measurements: 3.5 x 1.9 x 3 cm. Echotexture is heterogeneous. The previous intra testicular cyst is not well demonstrated on the current exam. No mass or microlithiasis visualized.   Right epididymis:  Normal in size and appearance.   Left epididymis:  Not well delineated.   Hydrocele:  Present on the right.  None on the left.   Varicocele:  None visualized.   Pulsed Doppler interrogation of both testes demonstrates normal low resistance arterial and venous waveforms bilaterally.   Other: The heterogeneous avascular extratesticular fluid collection is not discretely seen on the current exam.   IMPRESSION: 1. Resolution of the previous left scrotal abscess since prior exam. 2. Mildly heterogeneous testicular echotexture, nonspecific. The right testis is again noted to be within the inguinal canal.     Electronically Signed   By: Keith Rake M.D.   On: 09/12/2021 16:45   CLINICAL DATA:  Flank pain, suspected kidney stone.   EXAM: CT ABDOMEN AND PELVIS WITHOUT CONTRAST   TECHNIQUE: Multidetector CT imaging of the abdomen and pelvis was performed following the standard protocol without IV contrast.   RADIATION DOSE REDUCTION: This exam was performed according to the departmental dose-optimization program which includes automated exposure control, adjustment of the mA and/or kV according to patient size and/or use of iterative reconstruction technique.   COMPARISON:  March 28, 2021.   FINDINGS: Lower chest: Basilar atelectasis.  No effusion.  No consolidation.   Calcified coronary artery disease.   Hepatobiliary: Smooth hepatic contours. No visible hepatic lesion on noncontrast imaging. No pericholecystic stranding or distension of the gallbladder or gross distension of the biliary tree.   Pancreas: Signs of pancreatic atrophy.  No adjacent inflammation.   Spleen: Normal.   Adrenals/Urinary Tract: Adrenal  glands are normal.   Renal cortical scarring which is greatest on the RIGHT. Renal calculi in the RIGHT kidney are similar to slightly increased compared to previous imaging as are renal calculi on the LEFT. For instance, a 17 x 3 mm calculus in the RIGHT renal pelvis previously 14 mm greatest axial dimension, slight increase in size and number of smaller calculi, greatest about lower pole infundibular collecting system elements and calices.   RIGHT ureter with mild distension without visible calculus.   Large distal LEFT ureteral calculus measuring 15 x 8 mm resulting in moderate hydronephrosis and hydroureter.   Markedly trabeculated urinary bladder is under distended limiting assessment with circumferential bladder wall thickening and numerous bladder calculi not present on previous imaging at least 4-5 bladder calculi. Largest approximately 9 mm.   Cysts of the bilateral kidneys. Exophytic lesion on  the RIGHT is not entirely homogeneous but is unchanged measuring approximately 17-18 mm. Other cystic lesions with homogeneous water density.   Stomach/Bowel: No acute small bowel process. Appendix not visible but no secondary signs to suggest acute appendicitis. Large volume stool in the colon and particularly within the rectum. Mild perirectal stranding.   Vascular/Lymphatic: Normal caliber of the abdominal aorta. Scattered aortic atherosclerosis. No abdominal or pelvic lymphadenopathy.   Reproductive: Unremarkable by CT.   Other: No ascites   Musculoskeletal: Osteopenia. No acute or destructive bone process with spinal degenerative changes.   IMPRESSION: 1. Large distal LEFT ureteral calculus measuring 15 x 8 mm resulting in moderate hydronephrosis and hydroureter. 2. Markedly trabeculated urinary bladder is under distended limiting assessment with circumferential bladder wall thickening and numerous bladder calculi not present on previous imaging, at least 4-5 bladder  calculi. 3. Perivesical stranding and mild circumferential thickening may represent sequela of chronic bladder outlet obstruction or cystitis. There is also RIGHT ureteral distension which is new and may relate to a component of bladder outlet obstruction or inflammation or recent passage of calculus, no current evidence of ureteral calculus on the RIGHT. 4. Large volume stool in the colon and particularly within the rectum. Mild perirectal stranding. Correlate for any clinical evidence of fecal impaction and or stercoral colitis. 5. Calcified coronary artery disease. 6. Indeterminate RIGHT renal lesion may represent a cyst with mild hemorrhage or proteinaceous content. Consider follow-up imaging with renal sonogram on a nonemergent basis for further assessment as this finding is unchanged since December of 2022.   Aortic Atherosclerosis (ICD10-I70.0).     Electronically Signed   By: Zetta Bills M.D.   On: 09/12/2021 14:40 I have independently reviewed the films.  See HPI.  Bladder stones appear to be prostate calcifications versus bladder stones.  Assessment & Plan:   81 year old gentleman whose staff stated he was passing stone fragments underwent a CT scan on July 13, 2021 which demonstrated a 15 mm distal left ureteral calculus with moderate hydronephrosis, a markedly trabeculated urinary bladder, right ureteral distention and a large right renal stone.   -CATH UA grossly infected  -urine sent for culture -Started on Augmentin 875/125 while waiting culture results -He will be scheduled for cystoscopy, bilateral retrogrades, bilateral ureteroscopy with bilateral laser lithotripsy and bilateral ureteral stent placement - explained to the patient how the procedure is performed and the risks involved -informed the patient that they will have bilateral ureteral stents, which will remain in place for approximately 3-10 days and can be associated with flank pain, bladder pain,  dysuria, urgency, frequency, urinary leakage, and gross hematuria. -stents may be removed in the office with a cystoscope or patient may be instructed to remove the stents themselves by the string and that is decided on the day of the procedure -residual stones within the kidney or ureter may be present after the procedure and may need to have these addressed at a different encounter -injury to the ureter is the most common intra-operative risk, it may result in an open procedure to correct the defect -infection and bleeding are also risks -explained the risks of general anesthesia, such as: MI, CVA, paralysis, coma and/or death. -advised to contact our office or seek treatment in the ED if becomes febrile or pain/ vomiting are difficult control in order to arrange for emergent/urgent intervention -CATH UA grossly infected -Urine sent for culture   Patient will be scheduled for cystoscopy, bilateral retrogrades, bilateral ureteroscopy with bilateral stent placement.   These  notes generated with voice recognition software. I apologize for typographical errors.  Zara Council, PA-C  [facility] [address]  [place], [place] [address] [phone]

## 2021-09-14 ENCOUNTER — Encounter: Payer: Self-pay | Admitting: Urology

## 2021-09-14 ENCOUNTER — Ambulatory Visit (INDEPENDENT_AMBULATORY_CARE_PROVIDER_SITE_OTHER): Payer: Medicare Other | Admitting: Urology

## 2021-09-14 VITALS — BP 101/60 | HR 56

## 2021-09-14 DIAGNOSIS — N390 Urinary tract infection, site not specified: Secondary | ICD-10-CM

## 2021-09-14 DIAGNOSIS — N132 Hydronephrosis with renal and ureteral calculous obstruction: Secondary | ICD-10-CM | POA: Diagnosis not present

## 2021-09-14 DIAGNOSIS — N200 Calculus of kidney: Secondary | ICD-10-CM

## 2021-09-14 DIAGNOSIS — N201 Calculus of ureter: Secondary | ICD-10-CM

## 2021-09-14 DIAGNOSIS — N210 Calculus in bladder: Secondary | ICD-10-CM

## 2021-09-15 ENCOUNTER — Ambulatory Visit: Admit: 2021-09-15 | Payer: Medicare Other | Admitting: Urology

## 2021-09-15 ENCOUNTER — Telehealth: Payer: Self-pay

## 2021-09-15 ENCOUNTER — Ambulatory Visit (INDEPENDENT_AMBULATORY_CARE_PROVIDER_SITE_OTHER): Payer: Medicare Other

## 2021-09-15 VITALS — BP 100/60 | HR 48 | Temp 97.5°F | Resp 15 | Ht 70.0 in | Wt 140.7 lb

## 2021-09-15 DIAGNOSIS — Z0000 Encounter for general adult medical examination without abnormal findings: Secondary | ICD-10-CM

## 2021-09-15 LAB — URINALYSIS, COMPLETE
Bilirubin, UA: NEGATIVE
Glucose, UA: NEGATIVE
Ketones, UA: NEGATIVE
Nitrite, UA: POSITIVE — AB
Specific Gravity, UA: 1.02 (ref 1.005–1.030)
Urobilinogen, Ur: 0.2 mg/dL (ref 0.2–1.0)
pH, UA: 7 (ref 5.0–7.5)

## 2021-09-15 LAB — MICROSCOPIC EXAMINATION: WBC, UA: >30 /hpf — AB (ref 0–5)

## 2021-09-15 SURGERY — CYSTOSCOPY/URETEROSCOPY/HOLMIUM LASER/STENT PLACEMENT
Anesthesia: Choice

## 2021-09-15 MED ORDER — AMOXICILLIN-POT CLAVULANATE 875-125 MG PO TABS: 1.0000 | ORAL_TABLET | Freq: Two times a day (BID) | ORAL | 0 refills | Status: DC

## 2021-09-15 NOTE — Telephone Encounter (Signed)
-----   Message from Nori Riis, PA-C sent at 09/15/2021  7:35 AM EDT ----- Please let Mr. Rosana Berger staff know that his urine from yesterday looks like there is infection I like him to start Augmentin 875/125 twice daily for 7 days while we wait for the culture.

## 2021-09-15 NOTE — Progress Notes (Signed)
Subjective:   [name] is a 81 y.o. male who presents for Medicare Annual/Subsequent preventive examination.  Review of Systems     Cardiac Risk Factors include: advanced age (>67mn, >>37women);dyslipidemia;sedentary lifestyle;male gender     Objective:    Today's Vitals   09/15/21 0950  BP: 100/60  Pulse: (!) 48  Resp: 15  Temp: (!) 97.5 F (36.4 C)  TempSrc: Oral  SpO2: 97%  Weight: 140 lb 11.2 oz (63.8 kg)  Height: '5\' 10"'$  (1.778 m)   Body mass index is 20.19 kg/m.     09/15/2021   10:06 AM 07/05/2021   12:39 PM 07/01/2021    1:09 PM 05/28/2021   11:29 PM 05/20/2021    7:48 PM 05/20/2021   11:24 AM 05/19/2021   10:11 AM  Advanced Directives  Does Patient Have a Medical Advance Directive? Yes No No Yes Yes No No  Type of Advance Directive    Healthcare Power of [place]   Does patient want to make changes to medical advance directive?    No - Patient declined No - Patient declined    Copy of H[place] Chart?     No - copy requested    Would patient like information on creating a medical advance directive?  No - Guardian declined   No - Patient declined  No - Patient declined    Current Medications (verified) Outpatient Encounter Medications as of 09/15/2021  Medication Sig   alfuzosin (UROXATRAL) 10 MG 24 hr tablet TAKE 1 TABLET BY MOUTH DAILY   ALPRAZolam (XANAX) 0.25 MG tablet TAKE ONE TABLET BY MOUTH 2 TIMES A DAY AS NEEDED FOR ANXIETY (WHEN BLOOD PRESSURE IS VERY HIGH OR ANXIOUS) (Patient taking differently: Take 0.25 mg by mouth daily as needed (agitation (crisis med)).)   amoxicillin-clavulanate (AUGMENTIN) 875-125 MG tablet Take 1 tablet by mouth 2 (two) times daily for 7 days.   ARTIFICIAL TEARS 0.2-0.2-1 % SOLN PLACE 2 DROPS INTO EYE 2 TIMES A DAY AS NEEDED   atorvastatin (LIPITOR) 40 MG tablet TAKE 1 TABLET BY MOUTH EVERY EVENING   augmented betamethasone dipropionate (DIPROLENE-AF) 0.05 % cream  APPLY TOPICALLY AS DIRECTED TO RASH AS NEEDED   Calcium Carb-Cholecalciferol 500-10 MG-MCG TABS TAKE 1 TABLET BY MOUTH 2 TIMES A DAY   Cranberry Juice Powder 425 MG CAPS TAKE 1 CAPSULE BY MOUTH ONCE DAILY   Elastic Bandages & Supports (MEDICAL COMPRESSION STOCKINGS) MISC 1 Units by Does not apply route daily.   GNP VITAMIN C 500 MG tablet TAKE ONE TABLET BY MOUTH ONCE DAILY   Incontinence Supply Disposable (DEPEND SILHOUETTE BRIEFS L/XL) MISC 1 each by Does not apply route 2 (two) times daily as needed.   Iron, Ferrous Sulfate, 325 (65 Fe) MG TABS Take 325 mg by mouth daily.   lubiprostone (AMITIZA) 24 MCG capsule TAKE 1 CAPSULE BY MOUTH 2 TIMES DAILY WITH A MEAL   Nutritional Supplements (ENSURE ORIGINAL) LIQD Take 1 Can by mouth 3 (three) times daily.   nystatin cream (MYCOSTATIN) APPLY 1 APPLICATION TOPICALLY 2 TIMES DAILY   Polyethyl Glycol-Propyl Glycol (SYSTANE ULTRA OP) Place 1 drop into both eyes 2 (two) times daily as needed (dry eyes).   sertraline (ZOLOFT) 100 MG tablet TAKE 1 TABLET BY MOUTH DAILY   No facility-administered encounter medications on file as of 09/15/2021.    Allergies (verified) Patient has no known allergies.   History: Past Medical History:  Diagnosis Date  Anxiety    Atrophy, disuse, muscle    BPH (benign prostatic hyperplasia)    Cerebral palsy (HCC)    Eczema    Elevated lipids    Elevated PSA    Exotropia    Hyperlipidemia    Kidney stone    Leukopenia    Lower extremity edema    lymphedema   Mental retardation    Over weight    Prostate cancer ([place])    Venous insufficiency    Vitamin D deficiency    Wheelchair dependence    Past Surgical History:  Procedure Laterality Date   COLONOSCOPY WITH PROPOFOL N/A 05/10/2020   Procedure: COLONOSCOPY WITH PROPOFOL;  Surgeon: Lin Landsman, MD;  Location: [facility] ENDOSCOPY;  Service: Gastroenterology;  Laterality: N/A;   COLONOSCOPY WITH PROPOFOL N/A 05/12/2020   Procedure: COLONOSCOPY WITH  PROPOFOL;  Surgeon: Lin Landsman, MD;  Location: [facility] ENDOSCOPY;  Service: Gastroenterology;  Laterality: N/A;   CYSTOSCOPY/URETEROSCOPY/HOLMIUM LASER/STENT PLACEMENT Right 08/14/2017   Procedure: CYSTOSCOPY/URETEROSCOPY/HOLMIUM LASER/STENT PLACEMENT;  Surgeon: Abbie Sons, MD;  Location: [facility] ORS;  Service: Urology;  Laterality: Right;   ESOPHAGOGASTRODUODENOSCOPY (EGD) WITH PROPOFOL N/A 05/12/2020   Procedure: ESOPHAGOGASTRODUODENOSCOPY (EGD) WITH PROPOFOL;  Surgeon: Lin Landsman, MD;  Location: [facility] ENDOSCOPY;  Service: Gastroenterology;  Laterality: N/A;   INCISION AND DRAINAGE ABSCESS Left 07/05/2021   Procedure: INCISION AND DRAINAGE SCROTAL WALL ABSCESS;  Surgeon: Abbie Sons, MD;  Location: [facility] ORS;  Service: Urology;  Laterality: Left;   leg circulation surgery Right    PROSTATE SURGERY  11/10/2008   BRACHYTHERAPY   SCROTAL EXPLORATION Left 07/05/2021   Procedure: SCROTUM EXPLORATION;  Surgeon: Abbie Sons, MD;  Location: [facility] ORS;  Service: Urology;  Laterality: Left;   Family History  Problem Relation Age of Onset   Heart attack Brother    Heart disease Other    Social History   Socioeconomic History   Marital status: Single    Spouse name: Not on file   Number of children: Not on file   Years of education: Not on file   Highest education level: Not on file  Occupational History   Not on file  Tobacco Use   Smoking status: Never   Smokeless tobacco: Never  Vaping Use   Vaping Use: Never used  Substance and Sexual Activity   Alcohol use: No    Alcohol/week: 0.0 standard drinks   Drug use: No   Sexual activity: Not Currently  Other Topics Concern   Not on file  Social History Narrative   Lives at [facility] group home, 5 males. Moved to higher level of care 04/28/19 with [name].     Social Determinants of Health   Financial Resource Strain: Low Risk    Difficulty of Paying Living Expenses: Not hard at all  Food Insecurity: No Food  Insecurity   Worried About Charity fundraiser in the Last Year: Never true   [place] in the Last Year: Never true  Transportation Needs: No Transportation Needs   Lack of Transportation (Medical): No   Lack of Transportation (Non-Medical): No  Physical Activity: Inactive   Days of Exercise per Week: 0 days   Minutes of Exercise per Session: 0 min  Stress: No Stress Concern Present   Feeling of Stress : Not at all  Social Connections: Socially Isolated   Frequency of Communication with Friends and Family: Never   Frequency of Social Gatherings with Friends and Family: Never  Attends Religious Services: Never   Active Member of Clubs or Organizations: No   Attends Archivist Meetings: Never   Marital Status: Never married    Tobacco Counseling Counseling given: Not Answered   Clinical Intake:  Pre-visit preparation completed: Yes  Pain : No/denies pain     BMI - recorded: 20.19 Nutritional Status: BMI of 19-24  Normal Nutritional Risks: None Diabetes: No  How often do you need to have someone help you when you read instructions, pamphlets, or other written materials from your doctor or pharmacy?: 5 - Always    Interpreter Needed?: No  Information entered by :: Clemetine Marker LPN   Activities of Daily Living    09/15/2021   10:09 AM 08/15/2021    9:12 AM  In your present state of health, do you have any difficulty performing the following activities:  Hearing? 0 0  Vision? 0 0  Difficulty concentrating or making decisions? 0 0  Walking or climbing stairs? 1 1  Dressing or bathing? 1 1  Doing errands, shopping? 1 1  Preparing Food and eating ? Y   Using the Toilet? Y   In the past six months, have you accidently leaked urine? Y   Do you have problems with loss of bowel control? Y   Managing your Medications? Y   Managing your Finances? Y   Housekeeping or managing your Housekeeping? Y     Patient Care Team: Steele Sizer, MD as PCP -  General (Family Medicine) Alvy Bimler, MD as Consulting Physician (Psychiatry) Abbie Sons, MD (Urology) Schnier, Dolores Lory, MD (Vascular Surgery) Germaine Pomfret, [facility] (Pharmacist) Sindy Guadeloupe, MD as Consulting Physician (Oncology) Lin Landsman, MD as Consulting Physician (Gastroenterology)  Indicate any recent Medical Services you may have received from other than [facility] providers in the past year (date may be approximate).     Assessment:   This is a routine wellness examination for Corrie.  Hearing/Vision screen Hearing Screening - Comments:: Pt denies hearing difficulty  Vision Screening - Comments:: Annual vision screenings done by Dr. Matilde Sprang  Dietary issues and exercise activities discussed: Current Exercise Habits: The patient does not participate in regular exercise at present, Exercise limited by: orthopedic condition(s);neurologic condition(s)   Goals Addressed   None    Depression Screen    09/15/2021   10:05 AM 08/15/2021    9:11 AM 05/26/2021   11:10 AM 05/20/2021    9:51 AM 08/19/2020    2:55 PM 05/18/2020    8:28 AM 03/01/2020   10:16 AM  PHQ 2/9 Scores  PHQ - 2 Score 0 0 0 0 0 0 2  PHQ- 9 Score  0 0 0   2    Fall Risk    09/15/2021   10:08 AM 08/15/2021    9:11 AM 05/26/2021   11:18 AM 05/20/2021    9:51 AM 08/19/2020    2:56 PM  Fall Risk   Falls in the past year? 0 0 Exclusion - non ambulatory 0 0  Number falls in past yr: 0 0  0 0  Injury with Fall? 0 0  0 0  Risk for fall due to : Impaired mobility Impaired mobility Impaired mobility;Impaired balance/gait Impaired mobility Impaired mobility  Follow up Falls prevention discussed Falls prevention discussed Falls prevention discussed Falls prevention discussed Falls prevention discussed    FALL RISK PREVENTION PERTAINING TO THE HOME:  Any stairs in or around the home? No  If so, are there  any without handrails? No  Home free of loose throw rugs in walkways, pet beds, electrical  cords, etc? Yes  Adequate lighting in your home to reduce risk of falls? Yes   ASSISTIVE DEVICES UTILIZED TO PREVENT FALLS:  Life alert? No  Use of a cane, walker or w/c? Yes  Grab bars in the bathroom? Yes  Shower chair or bench in shower? Yes  Elevated toilet seat or a handicapped toilet? Yes   TIMED UP AND GO:  Was the test performed? No . Pt remained in wheelchair.     Cognitive Function: Cognitive status assessed by direct observation. Patient has current diagnosis of cognitive impairment. Patient is unable to complete screening 6CIT or MMSE.          Immunizations Immunization History  Administered Date(s) Administered   Fluad Quad(high Dose 65+) 03/01/2020   Influenza, High Dose Seasonal PF 02/11/2015, 02/14/2016, 02/14/2017, 02/19/2018, 01/27/2019, 01/20/2020   Influenza,inj,Quad PF,6+ Mos 02/04/2013   Influenza-Unspecified 12/31/2013, 02/11/2015   Moderna Sars-Covid-2 Vaccination 06/24/2019, 07/26/2019   Pneumococcal Conjugate-13 06/22/2014, 06/22/2014   Pneumococcal Polysaccharide-23 09/15/2009, 09/15/2009   Tdap 09/15/2009, 09/15/2009, 01/20/2020   Zoster Recombinat (Shingrix) 04/02/2020    TDAP status: Up to date  Flu Vaccine status: Up to date  Pneumococcal vaccine status: Up to date  Covid-19 vaccine status: Completed vaccines  Qualifies for Shingles Vaccine? Yes   Zostavax completed No   Shingrix Completed?: Yes  Screening Tests Health Maintenance  Topic Date Due   COVID-19 Vaccine (3 - Moderna risk series) 08/23/2019   Zoster Vaccines- Shingrix (2 of 2) 05/28/2020   INFLUENZA VACCINE  11/22/2021   TETANUS/TDAP  01/19/2030   Pneumonia Vaccine 74+ Years old  Completed   Hepatitis C Screening  Completed   HPV VACCINES  Aged Out    Health Maintenance  Health Maintenance Due  Topic Date Due   COVID-19 Vaccine (3 - Moderna risk series) 08/23/2019   Zoster Vaccines- Shingrix (2 of 2) 05/28/2020    Colorectal cancer screening: No longer  required.   Lung Cancer Screening: (Low Dose CT Chest recommended if Age 22-80 years, 30 pack-year currently smoking OR have quit w/in 15years.) does not qualify.   Additional Screening:  Hepatitis C Screening: does qualify; Completed 06/21/18  Vision Screening: Recommended annual ophthalmology exams for early detection of glaucoma and other disorders of the eye. Is the patient up to date with their annual eye exam?  Yes  Who is the provider or what is the name of the office in which the patient attends annual eye exams? Dr. Matilde Sprang.   Dental Screening: Recommended annual dental exams for proper oral hygiene  Community Resource Referral / Chronic Care Management: CRR required this visit?  No   CCM required this visit?  No      Plan:     I have personally reviewed and noted the following in the patient's chart:   Medical and social history Use of alcohol, tobacco or illicit drugs  Current medications and supplements including opioid prescriptions. Patient is not currently taking opioid prescriptions. Functional ability and status Nutritional status Physical activity Advanced directives List of other physicians Hospitalizations, surgeries, and ER visits in previous 12 months Vitals Screenings to include cognitive, depression, and falls Referrals and appointments  In addition, I have reviewed and discussed with patient certain preventive protocols, quality metrics, and best practice recommendations. A written personalized care plan for preventive services as well as general preventive health recommendations were provided to patient.  Clemetine Marker, LPN   6/60/[id number]   Nurse Notes: pt accompanied to visit today by [name] representative [name]. Please advise if patient is to continue Ensure 3 times daily and send updated order to group home. Thank you.

## 2021-09-15 NOTE — Telephone Encounter (Signed)
Spoke w [name], advised her of message. Abx sent. She expressed understanding.

## 2021-09-15 NOTE — Patient Instructions (Signed)
[name] , Thank you for taking time to come for your Medicare Wellness Visit. I appreciate your ongoing commitment to your health goals. Please review the following plan we discussed and let me know if I can assist you in the future.   Screening recommendations/referrals: Colonoscopy: no longer required Recommended yearly ophthalmology/optometry visit for glaucoma screening and checkup Recommended yearly dental visit for hygiene and checkup  Vaccinations: Influenza vaccine: due fall 2023 Pneumococcal vaccine: done 06/22/14 Tdap vaccine: done 01/20/20 Shingles vaccine: done 04/02/20, please bring a copy of your vaccine record to the next appt   Covid-19: done 06/24/19 & 07/26/19. Please bring the vaccine record to the next appt for booster information  Conditions/risks identified: recommend continuing fall prevention in the home  Next appointment: Follow up in one year for your annual wellness visit.   Preventive Care 79 Years and Older, Male Preventive care refers to lifestyle choices and visits with your health care provider that can promote health and wellness. What does preventive care include? A yearly physical exam. This is also called an annual well check. Dental exams once or twice a year. Routine eye exams. Ask your health care provider how often you should have your eyes checked. Personal lifestyle choices, including: Daily care of your teeth and gums. Regular physical activity. Eating a healthy diet. Avoiding tobacco and drug use. Limiting alcohol use. Practicing safe sex. Taking low doses of aspirin every day. Taking vitamin and mineral supplements as recommended by your health care provider. What happens during an annual well check? The services and screenings done by your health care provider during your annual well check will depend on your age, overall health, lifestyle risk factors, and family history of disease. Counseling  Your health care provider may ask you  questions about your: Alcohol use. Tobacco use. Drug use. Emotional well-being. Home and relationship well-being. Sexual activity. Eating habits. History of falls. Memory and ability to understand (cognition). Work and work Statistician. Screening  You may have the following tests or measurements: Height, weight, and BMI. Blood pressure. Lipid and cholesterol levels. These may be checked every 5 years, or more frequently if you are over 39 years old. Skin check. Lung cancer screening. You may have this screening every year starting at age 38 if you have a 30-pack-year history of smoking and currently smoke or have quit within the past 15 years. Fecal occult blood test (FOBT) of the stool. You may have this test every year starting at age 52. Flexible sigmoidoscopy or colonoscopy. You may have a sigmoidoscopy every 5 years or a colonoscopy every 10 years starting at age 79. Prostate cancer screening. Recommendations will vary depending on your family history and other risks. Hepatitis C blood test. Hepatitis B blood test. Sexually transmitted disease (STD) testing. Diabetes screening. This is done by checking your blood sugar (glucose) after you have not eaten for a while (fasting). You may have this done every 1-3 years. Abdominal aortic aneurysm (AAA) screening. You may need this if you are a current or former smoker. Osteoporosis. You may be screened starting at age 48 if you are at high risk. Talk with your health care provider about your test results, treatment options, and if necessary, the need for more tests. Vaccines  Your health care provider may recommend certain vaccines, such as: Influenza vaccine. This is recommended every year. Tetanus, diphtheria, and acellular pertussis (Tdap, Td) vaccine. You may need a Td booster every 10 years. Zoster vaccine. You may need this after age  60. Pneumococcal 13-valent conjugate (PCV13) vaccine. One dose is recommended after age  79. Pneumococcal polysaccharide (PPSV23) vaccine. One dose is recommended after age 47. Talk to your health care provider about which screenings and vaccines you need and how often you need them. This information is not intended to replace advice given to you by your health care provider. Make sure you discuss any questions you have with your health care provider. Document Released: 05/07/2015 Document Revised: 12/29/2015 Document Reviewed: 02/09/2015 Elsevier Interactive Patient Education  2017 [place] Prevention in the Home Falls can cause injuries. They can happen to people of all ages. There are many things you can do to make your home safe and to help prevent falls. What can I do on the outside of my home? Regularly fix the edges of walkways and driveways and fix any cracks. Remove anything that might make you trip as you walk through a door, such as a raised step or threshold. Trim any bushes or trees on the path to your home. Use bright outdoor lighting. Clear any walking paths of anything that might make someone trip, such as rocks or tools. Regularly check to see if handrails are loose or broken. Make sure that both sides of any steps have handrails. Any raised decks and porches should have guardrails on the edges. Have any leaves, snow, or ice cleared regularly. Use sand or salt on walking paths during winter. Clean up any spills in your garage right away. This includes oil or grease spills. What can I do in the bathroom? Use night lights. Install grab bars by the toilet and in the tub and shower. Do not use towel bars as grab bars. Use non-skid mats or decals in the tub or shower. If you need to sit down in the shower, use a plastic, non-slip stool. Keep the floor dry. Clean up any water that spills on the floor as soon as it happens. Remove soap buildup in the tub or shower regularly. Attach bath mats securely with double-sided non-slip rug tape. Do not have throw  rugs and other things on the floor that can make you trip. What can I do in the bedroom? Use night lights. Make sure that you have a light by your bed that is easy to reach. Do not use any sheets or blankets that are too big for your bed. They should not hang down onto the floor. Have a firm chair that has side arms. You can use this for support while you get dressed. Do not have throw rugs and other things on the floor that can make you trip. What can I do in the kitchen? Clean up any spills right away. Avoid walking on wet floors. Keep items that you use a lot in easy-to-reach places. If you need to reach something above you, use a strong step stool that has a grab bar. Keep electrical cords out of the way. Do not use floor polish or wax that makes floors slippery. If you must use wax, use non-skid floor wax. Do not have throw rugs and other things on the floor that can make you trip. What can I do with my stairs? Do not leave any items on the stairs. Make sure that there are handrails on both sides of the stairs and use them. Fix handrails that are broken or loose. Make sure that handrails are as long as the stairways. Check any carpeting to make sure that it is firmly attached to the stairs. Fix  any carpet that is loose or worn. Avoid having throw rugs at the top or bottom of the stairs. If you do have throw rugs, attach them to the floor with carpet tape. Make sure that you have a light switch at the top of the stairs and the bottom of the stairs. If you do not have them, ask someone to add them for you. What else can I do to help prevent falls? Wear shoes that: Do not have high heels. Have rubber bottoms. Are comfortable and fit you well. Are closed at the toe. Do not wear sandals. If you use a stepladder: Make sure that it is fully opened. Do not climb a closed stepladder. Make sure that both sides of the stepladder are locked into place. Ask someone to hold it for you, if  possible. Clearly mark and make sure that you can see: Any grab bars or handrails. First and last steps. Where the edge of each step is. Use tools that help you move around (mobility aids) if they are needed. These include: Canes. Walkers. Scooters. Crutches. Turn on the lights when you go into a dark area. Replace any light bulbs as soon as they burn out. Set up your furniture so you have a clear path. Avoid moving your furniture around. If any of your floors are uneven, fix them. If there are any pets around you, be aware of where they are. Review your medicines with your doctor. Some medicines can make you feel dizzy. This can increase your chance of falling. Ask your doctor what other things that you can do to help prevent falls. This information is not intended to replace advice given to you by your health care provider. Make sure you discuss any questions you have with your health care provider. Document Released: 02/04/2009 Document Revised: 09/16/2015 Document Reviewed: 05/15/2014 Elsevier Interactive Patient Education  2017 [employer].

## 2021-09-18 LAB — CULTURE, URINE COMPREHENSIVE

## 2021-09-20 ENCOUNTER — Other Ambulatory Visit: Payer: Self-pay

## 2021-09-20 ENCOUNTER — Inpatient Hospital Stay (HOSPITAL_BASED_OUTPATIENT_CLINIC_OR_DEPARTMENT_OTHER): Payer: Medicare Other | Admitting: Medical Oncology

## 2021-09-20 ENCOUNTER — Inpatient Hospital Stay: Payer: Medicare Other | Attending: Oncology

## 2021-09-20 VITALS — BP 107/58 | HR 64 | Temp 96.7°F | Resp 16 | Wt 140.0 lb

## 2021-09-20 DIAGNOSIS — D709 Neutropenia, unspecified: Secondary | ICD-10-CM

## 2021-09-20 DIAGNOSIS — D72819 Decreased white blood cell count, unspecified: Secondary | ICD-10-CM

## 2021-09-20 DIAGNOSIS — D649 Anemia, unspecified: Secondary | ICD-10-CM

## 2021-09-20 DIAGNOSIS — G809 Cerebral palsy, unspecified: Secondary | ICD-10-CM | POA: Insufficient documentation

## 2021-09-20 DIAGNOSIS — I959 Hypotension, unspecified: Secondary | ICD-10-CM

## 2021-09-20 DIAGNOSIS — D61818 Other pancytopenia: Secondary | ICD-10-CM | POA: Diagnosis present

## 2021-09-20 DIAGNOSIS — F79 Unspecified intellectual disabilities: Secondary | ICD-10-CM | POA: Insufficient documentation

## 2021-09-20 DIAGNOSIS — D696 Thrombocytopenia, unspecified: Secondary | ICD-10-CM

## 2021-09-20 DIAGNOSIS — E860 Dehydration: Secondary | ICD-10-CM

## 2021-09-20 LAB — IRON AND TIBC
Iron: 109 ug/dL (ref 45–182)
Saturation Ratios: 33 % (ref 17.9–39.5)
TIBC: 330 ug/dL (ref 250–450)
UIBC: 221 ug/dL

## 2021-09-20 LAB — CBC WITH DIFFERENTIAL/PLATELET
Abs Immature Granulocytes: 0.01 10*3/uL (ref 0.00–0.07)
Basophils Absolute: 0 10*3/uL (ref 0.0–0.1)
Basophils Relative: 0 %
Eosinophils Absolute: 0.1 10*3/uL (ref 0.0–0.5)
Eosinophils Relative: 1 %
HCT: 37.1 % — ABNORMAL LOW (ref 39.0–52.0)
Hemoglobin: 12.5 g/dL — ABNORMAL LOW (ref 13.0–17.0)
Immature Granulocytes: 0 %
Lymphocytes Relative: 23 %
Lymphs Abs: 1 10*3/uL (ref 0.7–4.0)
MCH: 32.6 pg (ref 26.0–34.0)
MCHC: 33.7 g/dL (ref 30.0–36.0)
MCV: 96.6 fL (ref 80.0–100.0)
Monocytes Absolute: 0.3 10*3/uL (ref 0.1–1.0)
Monocytes Relative: 7 %
Neutro Abs: 2.9 10*3/uL (ref 1.7–7.7)
Neutrophils Relative %: 69 %
Platelets: 146 10*3/uL — ABNORMAL LOW (ref 150–400)
RBC: 3.84 MIL/uL — ABNORMAL LOW (ref 4.22–5.81)
RDW: 14.5 % (ref 11.5–15.5)
WBC: 4.3 10*3/uL (ref 4.0–10.5)
nRBC: 0 % (ref 0.0–0.2)

## 2021-09-20 LAB — VITAMIN B12: Vitamin B-12: 603 pg/mL (ref 180–914)

## 2021-09-20 LAB — FERRITIN: Ferritin: 29 ng/mL (ref 24–336)

## 2021-09-20 NOTE — Progress Notes (Signed)
Pt here for follow-up with caregiver present. Per caregiver, pt has been dealing with kidney stones recently. He also had a procedure several months ago to remove scrotal cyst.

## 2021-09-20 NOTE — Progress Notes (Addendum)
Hematology/Oncology Consult Note [facility]  [phone] [phone]  Patient Care Team: Steele Sizer, MD as PCP - General (Family Medicine) Alvy Bimler, MD as Consulting Physician (Psychiatry) Abbie Sons, MD (Urology) Schnier, Dolores Lory, MD (Vascular Surgery) Germaine Pomfret, [facility] (Pharmacist) Sindy Guadeloupe, MD as Consulting Physician (Oncology) Lin Landsman, MD as Consulting Physician (Gastroenterology)   Name of the patient: [name]  [id number]  [birth year]   Date of visit: 09/20/21  Diagnosis-intermittent neutropenia  Chief complaint/ Reason for visit-routine follow-up of neutropenia, anemia, thrombocytopenia  Heme/Onc history: Patient is a 81 year old male was been referred to [place] for leukopenia.  Most recent CBC on 06/05/2018 showed a white count of 3.5, H&H of 12.5/35.9 and a platelet count of 131.  Differential on the CBC was essentially normal.  His past medical history significant for hypertension, cerebral palsy, history of  depression.  He lives in a nursing home and is unable to provide any history. He also sees urology for recurrent UTIs.   Results of blood work from 06/21/2018 were as follows: CBC showed white count of 5.5 with a normal differential and an ANC of 4.0.  H&H 12.8/37.1 and a platelet count of 158.  Hepatitis C testing was negative.  Hepatitis B negative also suggesting patient has not been vaccinated for it so far.  Iron studies were within normal limits.  Haptoglobin was normal.  TSH was normal.  Multiple myeloma panel showed no M protein.  Smear review showed adequate platelets and normal WBC and RBC morphology  Interval history- Patient is 81 year old male who returns to clinic for follow up for thrombocytopenia and neutropenia. His caregiver from group home accompanies him today. Overall feeling well. Working with urology for a kidney stone which is not causing him pain today.   ECOG  PS- 4  Review of systems- Review of Systems  Unable to perform ROS: Psychiatric disorder   No Known Allergies   Past Medical History:  Diagnosis Date   Anxiety    Atrophy, disuse, muscle    BPH (benign prostatic hyperplasia)    Cerebral palsy (HCC)    Eczema    Elevated lipids    Elevated PSA    Exotropia    Hyperlipidemia    Kidney stone    Leukopenia    Lower extremity edema    lymphedema   Mental retardation    Over weight    Prostate cancer ([place])    Venous insufficiency    Vitamin D deficiency    Wheelchair dependence     Past Surgical History:  Procedure Laterality Date   COLONOSCOPY WITH PROPOFOL N/A 05/10/2020   Procedure: COLONOSCOPY WITH PROPOFOL;  Surgeon: Lin Landsman, MD;  Location: [facility] ENDOSCOPY;  Service: Gastroenterology;  Laterality: N/A;   COLONOSCOPY WITH PROPOFOL N/A 05/12/2020   Procedure: COLONOSCOPY WITH PROPOFOL;  Surgeon: Lin Landsman, MD;  Location: [facility] ENDOSCOPY;  Service: Gastroenterology;  Laterality: N/A;   CYSTOSCOPY/URETEROSCOPY/HOLMIUM LASER/STENT PLACEMENT Right 08/14/2017   Procedure: CYSTOSCOPY/URETEROSCOPY/HOLMIUM LASER/STENT PLACEMENT;  Surgeon: Abbie Sons, MD;  Location: [facility] ORS;  Service: Urology;  Laterality: Right;   ESOPHAGOGASTRODUODENOSCOPY (EGD) WITH PROPOFOL N/A 05/12/2020   Procedure: ESOPHAGOGASTRODUODENOSCOPY (EGD) WITH PROPOFOL;  Surgeon: Lin Landsman, MD;  Location: [facility] ENDOSCOPY;  Service: Gastroenterology;  Laterality: N/A;   INCISION AND DRAINAGE ABSCESS Left 07/05/2021   Procedure: INCISION AND DRAINAGE SCROTAL WALL ABSCESS;  Surgeon: Abbie Sons, MD;  Location: [facility] ORS;  Service: Urology;  Laterality: Left;   leg circulation surgery Right    PROSTATE SURGERY  11/10/2008   BRACHYTHERAPY   SCROTAL EXPLORATION Left 07/05/2021   Procedure: SCROTUM EXPLORATION;  Surgeon: Abbie Sons, MD;  Location: [facility] ORS;  Service: Urology;  Laterality: Left;    Social History   Socioeconomic  History   Marital status: Single    Spouse name: Not on file   Number of children: Not on file   Years of education: Not on file   Highest education level: Not on file  Occupational History   Not on file  Tobacco Use   Smoking status: Never   Smokeless tobacco: Never  Vaping Use   Vaping Use: Never used  Substance and Sexual Activity   Alcohol use: No    Alcohol/week: 0.0 standard drinks   Drug use: No   Sexual activity: Not Currently  Other Topics Concern   Not on file  Social History Narrative   Lives at [facility] group home, 5 males. Moved to higher level of care 04/28/19 with [name].     Social Determinants of Health   Financial Resource Strain: Low Risk    Difficulty of Paying Living Expenses: Not hard at all  Food Insecurity: No Food Insecurity   Worried About Charity fundraiser in the Last Year: Never true   [place] in the Last Year: Never true  Transportation Needs: No Transportation Needs   Lack of Transportation (Medical): No   Lack of Transportation (Non-Medical): No  Physical Activity: Inactive   Days of Exercise per Week: 0 days   Minutes of Exercise per Session: 0 min  Stress: No Stress Concern Present   Feeling of Stress : Not at all  Social Connections: Socially Isolated   Frequency of Communication with Friends and Family: Never   Frequency of Social Gatherings with Friends and Family: Never   Attends Religious Services: Never   Marine scientist or Organizations: No   Attends Music therapist: Never   Marital Status: Never married  Human resources officer Violence: Not At Risk   Fear of Current or Ex-Partner: No   Emotionally Abused: No   Physically Abused: No   Sexually Abused: No    Family History  Problem Relation Age of Onset   Heart attack Brother    Heart disease Other     Current Outpatient Medications:    alfuzosin (UROXATRAL) 10 MG 24 hr tablet, TAKE 1 TABLET BY MOUTH DAILY, Disp: 90 tablet, Rfl: 1    ALPRAZolam (XANAX) 0.25 MG tablet, TAKE ONE TABLET BY MOUTH 2 TIMES A DAY AS NEEDED FOR ANXIETY (WHEN BLOOD PRESSURE IS VERY HIGH OR ANXIOUS) (Patient taking differently: Take 0.25 mg by mouth daily as needed (agitation (crisis med)).), Disp: 20 tablet, Rfl: 0   amoxicillin-clavulanate (AUGMENTIN) 875-125 MG tablet, Take 1 tablet by mouth 2 (two) times daily for 7 days., Disp: 14 tablet, Rfl: 0   ARTIFICIAL TEARS 0.2-0.2-1 % SOLN, PLACE 2 DROPS INTO EYE 2 TIMES A DAY AS NEEDED, Disp: 15 mL, Rfl: 2   atorvastatin (LIPITOR) 40 MG tablet, TAKE 1 TABLET BY MOUTH EVERY EVENING, Disp: 90 tablet, Rfl: 1   Calcium Carb-Cholecalciferol 500-10 MG-MCG TABS, TAKE 1 TABLET BY MOUTH 2 TIMES A DAY, Disp: 180 tablet, Rfl: 0   Cranberry Juice Powder 425 MG CAPS, TAKE 1 CAPSULE BY MOUTH ONCE DAILY, Disp: 90 capsule, Rfl: 1   Elastic Bandages & Supports (MEDICAL COMPRESSION STOCKINGS) MISC, 1  Units by Does not apply route daily., Disp: 2 each, Rfl: 0   GNP VITAMIN C 500 MG tablet, TAKE ONE TABLET BY MOUTH ONCE DAILY, Disp: 100 tablet, Rfl: 1   Iron, Ferrous Sulfate, 325 (65 Fe) MG TABS, Take 325 mg by mouth daily., Disp: 30 tablet, Rfl: 3   lubiprostone (AMITIZA) 24 MCG capsule, TAKE 1 CAPSULE BY MOUTH 2 TIMES DAILY WITH A MEAL, Disp: 60 capsule, Rfl: 5   Nutritional Supplements (ENSURE ORIGINAL) LIQD, Take 1 Can by mouth 3 (three) times daily., Disp: 5.688 mL, Rfl: 5   nystatin cream (MYCOSTATIN), APPLY 1 APPLICATION TOPICALLY 2 TIMES DAILY, Disp: 30 g, Rfl: 0   Polyethyl Glycol-Propyl Glycol (SYSTANE ULTRA OP), Place 1 drop into both eyes 2 (two) times daily as needed (dry eyes)., Disp: , Rfl:    sertraline (ZOLOFT) 100 MG tablet, TAKE 1 TABLET BY MOUTH DAILY, Disp: 90 tablet, Rfl: 1   augmented betamethasone dipropionate (DIPROLENE-AF) 0.05 % cream, APPLY TOPICALLY AS DIRECTED TO RASH AS NEEDED, Disp: 45 g, Rfl: 0   Incontinence Supply Disposable (DEPEND SILHOUETTE BRIEFS L/XL) MISC, 1 each by Does not apply route 2  (two) times daily as needed., Disp: 60 each, Rfl: 5  Physical exam:  Vitals:   09/20/21 1108  BP: (!) 107/58  Pulse: 64  Resp: 16  Temp: (!) 96.7 F (35.9 C)  TempSrc: Tympanic  SpO2: 98%  Weight: 140 lb (63.5 kg)    Physical Exam Constitutional:      Comments: Thin elderly gentleman.  Sitting in a wheelchair and appears in no acute distress  Eyes:     Pupils: Pupils are equal, round, and reactive to light.  Cardiovascular:     Rate and Rhythm: Normal rate and regular rhythm.     Heart sounds: Normal heart sounds.  Pulmonary:     Effort: Pulmonary effort is normal.     Breath sounds: Normal breath sounds.  Abdominal:     General: Bowel sounds are normal.     Palpations: Abdomen is soft.  Skin:    General: Skin is warm and dry.  Neurological:     Mental Status: He is alert.        Latest Ref Rng & Units 08/15/2021   10:01 AM  CMP  Glucose 65 - 99 mg/dL 102    BUN 7 - 25 mg/dL 32    Creatinine 0.70 - 1.28 mg/dL 0.74    Sodium 135 - 146 mmol/L 144    Potassium 3.5 - 5.3 mmol/L 3.8    Chloride 98 - 110 mmol/L 109    CO2 20 - 32 mmol/L 29    Calcium 8.6 - 10.3 mg/dL 9.2    Total Protein 6.1 - 8.1 g/dL 6.1    Total Bilirubin 0.2 - 1.2 mg/dL 0.3    AST 10 - 35 U/L 15    ALT 9 - 46 U/L 14        Latest Ref Rng & Units 09/20/2021   10:40 AM  CBC  WBC 4.0 - 10.5 K/uL 4.3    Hemoglobin 13.0 - 17.0 g/dL 12.5    Hematocrit 39.0 - 52.0 % 37.1    Platelets 150 - 400 K/uL 146     Iron/TIBC/Ferritin/ %Sat    Component Value Date/Time   IRON 79 08/15/2021 1001   IRON 72 06/11/2020 1119   TIBC 288 08/15/2021 1001   TIBC 284 06/11/2020 1119   FERRITIN 28 08/15/2021 1001   FERRITIN 45 06/11/2020 1119  IRONPCTSAT 27 08/15/2021 1001   Component Ref Range & Units 11 mo ago (06/11/20) 1 yr ago (05/10/20) 1 yr ago (05/10/20) 1 yr ago (08/29/19) 2 yr ago (05/24/18)  Vitamin B-12 232 - 1,245 pg/mL 547  774 R, CM   267 R, CM  308 R, CM   Folate >3.0 ng/mL 7.9   5.8 Low  R,  CM   10.5 R, CM      Assessment and plan- Patient is a 81 y.o. male who follows up for mild pancytopenia.   Neutropenia- his white count has fluctuated between 4-10 for several years. Today his count is normal at 4.3  Thrombocytopenia- mild baseline thrombocytopenia which fluctuates between 120s-140s. Today is normal at 146  Anemia- Fluctuates between 9-11. Today it is improved compared to last visit at 12.5. Normocytic. Iron studies pending at time of visit. Suspect chronic disease etiology.   Hypotension- Improved recently and today. Continue PCP follow up. If symptoms or pressures worsen, recommend ER.   Patient is accompanied by his caregiver today from group home where he resides. Patient has legal guardian, Anselm Jungling, his niece.  4-6 mo- lab (cbc, ferritin, iron studies, b12), Jan[facility]- [place]  I discussed the assessment and treatment plan with the patient. The patient was provided an opportunity to ask questions and all were answered. The patient agreed with the plan and demonstrated an understanding of the instructions.   The patient was advised to call back or seek an in-person evaluation if the symptoms worsen or if the condition fails to improve as anticipated.   I spent 30 minutes face-to-face visit time dedicated to the care of this patient on the date of this encounter to including pre-visit review of hematology notes, urology note, face-to-face time with the patient, and post visit ordering of testing/documentation, and family updates.    [place] at [facility]    Visit Diagnosis 1. Neutropenia, unspecified type ([place])   2. Thrombocytopenia ([place])   3. Normocytic anemia   4. Hypotension, unspecified hypotension type    09/20/2021  CC: Dr Ancil Boozer

## 2021-09-21 ENCOUNTER — Other Ambulatory Visit: Payer: Self-pay | Admitting: Family Medicine

## 2021-09-21 DIAGNOSIS — E441 Mild protein-calorie malnutrition: Secondary | ICD-10-CM

## 2021-09-21 NOTE — Progress Notes (Signed)
[facility] Posting Form   Surgery Date/Time: Date: 10/04/2021  Surgeon: Dr. John Giovanni, MD  Surgery Location: Day Surgery  Inpt ( No  )   Outpt (Yes)   Obs ( No  )   Diagnosis: N20.1 Left Ureteral Stone, N20.0 Bilateral Renal Stones, N32.89 Trabeculated Bladder  -CPT: 83151, [phone]  Surgery: Bilateral Ureteroscopy with laser lithotripsy and stent placement with bilateral retrograde pyelograms   Stop Anticoagulations: Yes and also hold ASA  Cardiac/Medical/Pulmonary Clearance needed: no  *Orders entered into EPIC  Date: 09/21/21   *Case booked in EPIC  Date: 09/16/2021  *Notified pt of Surgery: Date: 09/16/2021  PRE-OP UA & CX: yes, obtained in clinic 09/14/2021  *Placed into Prior Authorization Work Fabio Bering Date: 09/21/21   Assistant/laser/rep:No

## 2021-09-22 ENCOUNTER — Other Ambulatory Visit: Payer: Self-pay

## 2021-09-22 ENCOUNTER — Telehealth: Payer: Self-pay | Admitting: Urology

## 2021-09-22 ENCOUNTER — Emergency Department: Payer: Medicare Other

## 2021-09-22 ENCOUNTER — Encounter: Payer: Self-pay | Admitting: Emergency Medicine

## 2021-09-22 ENCOUNTER — Emergency Department
Admission: EM | Admit: 2021-09-22 | Discharge: 2021-09-22 | Disposition: A | Discharge status: Home / Self Care | Payer: Medicare Other | Attending: Emergency Medicine | Admitting: Emergency Medicine

## 2021-09-22 DIAGNOSIS — R109 Unspecified abdominal pain: Secondary | ICD-10-CM | POA: Diagnosis present

## 2021-09-22 DIAGNOSIS — N200 Calculus of kidney: Secondary | ICD-10-CM

## 2021-09-22 DIAGNOSIS — N132 Hydronephrosis with renal and ureteral calculous obstruction: Secondary | ICD-10-CM | POA: Diagnosis not present

## 2021-09-22 DIAGNOSIS — E441 Mild protein-calorie malnutrition: Secondary | ICD-10-CM

## 2021-09-22 DIAGNOSIS — R319 Hematuria, unspecified: Secondary | ICD-10-CM

## 2021-09-22 LAB — CBC WITH DIFFERENTIAL/PLATELET
Abs Immature Granulocytes: 0.05 10*3/uL (ref 0.00–0.07)
Basophils Absolute: 0 10*3/uL (ref 0.0–0.1)
Basophils Relative: 1 %
Eosinophils Absolute: 0.1 10*3/uL (ref 0.0–0.5)
Eosinophils Relative: 2 %
HCT: 37.1 % — ABNORMAL LOW (ref 39.0–52.0)
Hemoglobin: 12.6 g/dL — ABNORMAL LOW (ref 13.0–17.0)
Immature Granulocytes: 1 %
Lymphocytes Relative: 36 %
Lymphs Abs: 1.6 10*3/uL (ref 0.7–4.0)
MCH: 32.7 pg (ref 26.0–34.0)
MCHC: 34 g/dL (ref 30.0–36.0)
MCV: 96.4 fL (ref 80.0–100.0)
Monocytes Absolute: 0.3 10*3/uL (ref 0.1–1.0)
Monocytes Relative: 8 %
Neutro Abs: 2.3 10*3/uL (ref 1.7–7.7)
Neutrophils Relative %: 52 %
Platelets: 150 10*3/uL (ref 150–400)
RBC: 3.85 MIL/uL — ABNORMAL LOW (ref 4.22–5.81)
RDW: 14.2 % (ref 11.5–15.5)
WBC: 4.3 10*3/uL (ref 4.0–10.5)
nRBC: 0 % (ref 0.0–0.2)

## 2021-09-22 LAB — URINALYSIS, ROUTINE W REFLEX MICROSCOPIC
Bilirubin Urine: NEGATIVE
Glucose, UA: NEGATIVE mg/dL
Ketones, ur: NEGATIVE mg/dL
Nitrite: POSITIVE — AB
Protein, ur: 100 mg/dL — AB
RBC / HPF: >50 RBC/hpf — ABNORMAL HIGH (ref 0–5)
Specific Gravity, Urine: 1.013 (ref 1.005–1.030)
WBC, UA: >50 WBC/hpf — ABNORMAL HIGH (ref 0–5)
pH: 8 (ref 5.0–8.0)

## 2021-09-22 LAB — BASIC METABOLIC PANEL
Anion gap: 5 (ref 5–15)
BUN: 26 mg/dL — ABNORMAL HIGH (ref 8–23)
CO2: 27 mmol/L (ref 22–32)
Calcium: 9.4 mg/dL (ref 8.9–10.3)
Chloride: 108 mmol/L (ref 98–111)
Creatinine, Ser: 0.6 mg/dL — ABNORMAL LOW (ref 0.61–1.24)
GFR, Estimated: >60 mL/min (ref >60)
Glucose, Bld: 91 mg/dL (ref 70–99)
Potassium: 4.2 mmol/L (ref 3.5–5.1)
Sodium: 140 mmol/L (ref 135–145)

## 2021-09-22 MED ORDER — AMOXICILLIN-POT CLAVULANATE 875-125 MG PO TABS: 1.0000 | ORAL_TABLET | Freq: Two times a day (BID) | ORAL | 0 refills | Status: AC

## 2021-09-22 MED ORDER — CALCIUM CARB-CHOLECALCIFEROL 500-10 MG-MCG PO TABS: 1.0000 | ORAL_TABLET | Freq: Two times a day (BID) | ORAL | 0 refills | Status: DC

## 2021-09-22 NOTE — ED Notes (Addendum)
Group Home staff member present at the bedside - Legal Guardian Anselm Jungling) on the phone to discuss the patient with MD Archie Balboa.

## 2021-09-22 NOTE — ED Notes (Signed)
Male purewick placed at this time.  

## 2021-09-22 NOTE — ED Notes (Signed)
Group home contacted about the patients departure from the ED.

## 2021-09-22 NOTE — Discharge Instructions (Addendum)
Please seek medical attention for any high fevers, chest pain, shortness of breath, change in behavior, persistent vomiting, bloody stool or any other new or concerning symptoms.  

## 2021-09-22 NOTE — ED Notes (Signed)
Pt provided water.  

## 2021-09-22 NOTE — ED Notes (Addendum)
Update given to legal guardian- [name] at this time.

## 2021-09-22 NOTE — Telephone Encounter (Signed)
[name] called, [phone], pt has blood in urine, and is scheduled for surgery for kidney stone. [name] wants to know what should we do.  Please advise.

## 2021-09-22 NOTE — ED Provider Notes (Signed)
[facility] Provider Note    Event Date/Time   First MD Initiated Contact with Patient 09/22/21 1821     (approximate)   History   Hematuria   HPI  [name] is a 81 y.o. male  who, per urology note dated 09/14/21 hd ct scan which showed 14 mm left ureteral stone with hydronephrosis and had UA concerning for infection, who presents to the emergency department today from group home because of concern for left flank pain. Apparently the patient was screaming at the living facility. They also have noticed some blood in his urine. The patient himself cannot give any significant history.   Physical Exam   Triage Vital Signs: ED Triage Vitals  Enc Vitals Group     BP 09/22/21 1729 128/69     Pulse Rate 09/22/21 1729 61     Resp 09/22/21 1729 17     Temp 09/22/21 1729 (!) 97.5 F (36.4 C)     Temp Source 09/22/21 1729 Axillary     SpO2 09/22/21 1729 97 %     Weight --      Height --      Head Circumference --      Peak Flow --      Pain Score 09/22/21 1724 0   Most recent vital signs: Vitals:   09/22/21 1830 09/22/21 1900  BP: (!) 157/85 (!) 163/76  Pulse: (!) 52 60  Resp: 15 15  Temp:  97.9 F (36.6 C)  SpO2: 100% 97%   General: Awake, alert.  CV:  Good peripheral perfusion. Regular rate and rhythm. Resp:  Normal effort. Lungs clear. Abd:  No distention. No tenderness to palpation.  ED Results / Procedures / Treatments   Labs (all labs ordered are listed, but only abnormal results are displayed) Labs Reviewed  URINALYSIS, ROUTINE W REFLEX MICROSCOPIC - Abnormal; Notable for the following components:      Result Value   Color, Urine AMBER (*)    APPearance CLOUDY (*)    Hgb urine dipstick LARGE (*)    Protein, ur 100 (*)    Nitrite POSITIVE (*)    Leukocytes,Ua LARGE (*)    RBC / HPF >50 (*)    WBC, UA >50 (*)    Bacteria, UA MANY (*)    All other components within normal limits  CBC WITH DIFFERENTIAL/PLATELET - Abnormal;  Notable for the following components:   RBC 3.85 (*)    Hemoglobin 12.6 (*)    HCT 37.1 (*)    All other components within normal limits  BASIC METABOLIC PANEL - Abnormal; Notable for the following components:   BUN 26 (*)    Creatinine, Ser 0.60 (*)    All other components within normal limits     EKG  I, Nance Pear, attending physician, personally viewed and interpreted this EKG  EKG Time: 1729 Rate: 60 Rhythm: sinus rhythm Axis: normal Intervals: qtc 567 QRS: narrow, q waves v1 ST changes: no st elevation Impression: abnormal ekg  RADIOLOGY I independently interpreted and visualized the CT renal. My interpretation: left uvj stone Radiology interpretation: IMPRESSION: LEFT hydronephrosis and hydroureter secondary to a 14 x 8 mm distal LEFT ureteral calculus near ureterovesical junction.   Additional tiny nonobstructing distal LEFT ureteral calculus and tiny distal RIGHT ureteral calculus.   BILATERAL renal calculi.   Thick-walled urinary bladder with irregular margins, could be related to chronic outlet obstruction or cystitis, recommend correlation with urinalysis.   Question anorectal wall  thickening versus artifact from underdistention; recommend correlation with digital rectal exam and proctoscopy.   BILATERAL spondylolysis L4 with grade 1 anterolisthesis.   Aortic Atherosclerosis (ICD10-I70.0).    PROCEDURES:  Critical Care performed: No  Procedures   MEDICATIONS ORDERED IN ED: Medications - No data to display   IMPRESSION / MDM / [place] / ED COURSE  I reviewed the triage vital signs and the nursing notes.                              Differential diagnosis includes, but is not limited to, kidney stone, AAA, diverticulitis, UTI.  Patient's presentation is most consistent with acute presentation with potential threat to life or bodily function.  Patient presented to the emergency department today because of concern for left  flank pain.  At the time my exam however patient without any pain.  Patient remained pain-free while in the emergency department.  Did obtain a CT renal study given recent history of urology issues.  CT today did continue to show a large left UVJ stone.  Urine also continues to show findings concerning for infection.  I discussed with Dr. Diamantina Providence with urology.  At this time did not feel any emergent intervention was necessary.  Did recommend further oral antibiotics.  Additionally will discharge patient with prescription for pain medication.  Patient is scheduled for cystoscopy in a couple of weeks however it will be attempted to be moved earlier.  FINAL CLINICAL IMPRESSION(S) / ED DIAGNOSES   Final diagnoses:  Hematuria, unspecified type  Kidney stone      Note:  This document was prepared using Dragon voice recognition software and may include unintentional dictation errors.    Nance Pear, MD 09/22/21 2240

## 2021-09-22 NOTE — ED Triage Notes (Signed)
Pt to ED via [facility] from [name] for blood in urine and right flank pain. Pt was given tylenol by care giver. Pt is in NAD.

## 2021-09-23 NOTE — Telephone Encounter (Signed)
He was seen in the ED last night and is scheduled for surgery on 6/13.

## 2021-09-28 ENCOUNTER — Telehealth: Payer: Medicare Other

## 2021-09-30 ENCOUNTER — Other Ambulatory Visit: Payer: Self-pay

## 2021-09-30 ENCOUNTER — Encounter
Admission: RE | Admit: 2021-09-30 | Discharge: 2021-09-30 | Disposition: A | Discharge status: Home / Self Care | Payer: Medicare Other | Source: Ambulatory Visit | Attending: Urology | Admitting: Urology

## 2021-09-30 HISTORY — DX: Personal history of urinary calculi: Z87.442

## 2021-09-30 NOTE — Patient Instructions (Addendum)
Your procedure is scheduled on: 10/04/21 - Tuesday Report to the Registration Desk on the 1st floor of the [facility]. To find out your arrival time, please call [phone] between 1PM - 3PM on: 10/03/21 - Monday If your arrival time is 6:00 am, do not arrive prior to that time as the [facility] entrance doors do not open until 6:00 am.  REMEMBER: Instructions that are not followed completely may result in serious medical risk, up to and including death; or upon the discretion of your surgeon and anesthesiologist your surgery may need to be rescheduled.  Do not eat food or drink any fluids after midnight the night before surgery.  No gum chewing, lozengers or hard candies.  TAKE THESE MEDICATIONS THE MORNING OF SURGERY WITH A SIP OF WATER: NONE  One week prior to surgery: Stop Anti-inflammatories (NSAIDS) such as Advil, Aleve, Ibuprofen, Motrin, Naproxen, Naprosyn and Aspirin based products such as Excedrin, Goodys Powder, BC Powder.  Stop ANY OVER THE COUNTER supplements until after surgery.  You may however, continue to take Tylenol if needed for pain up until the day of surgery.  No Alcohol for 24 hours before or after surgery.  No Smoking including e-cigarettes for 24 hours prior to surgery.  No chewable tobacco products for at least 6 hours prior to surgery.  No nicotine patches on the day of surgery.  Do not use any "recreational" drugs for at least a week prior to your surgery.  Please be advised that the combination of cocaine and anesthesia may have negative outcomes, up to and including death. If you test positive for cocaine, your surgery will be cancelled.  On the morning of surgery brush your teeth with toothpaste and water, you may rinse your mouth with mouthwash if you wish. Do not swallow any toothpaste or mouthwash.  Do not wear jewelry, make-up, hairpins, clips or nail polish.  Do not wear lotions, powders, or perfumes.   Do not shave body from the neck  down 48 hours prior to surgery just in case you cut yourself which could leave a site for infection.  Also, freshly shaved skin may become irritated if using the CHG soap.  Contact lenses, hearing aids and dentures may not be worn into surgery.  Do not bring valuables to the hospital. [facility] is not responsible for any missing/lost belongings or valuables.   Notify your doctor if there is any change in your medical condition (cold, fever, infection).  Wear comfortable clothing (specific to your surgery type) to the hospital.  After surgery, you can help prevent lung complications by doing breathing exercises.  Take deep breaths and cough every 1-2 hours. Your doctor may order a device called an Incentive Spirometer to help you take deep breaths. When coughing or sneezing, hold a pillow firmly against your incision with both hands. This is called "splinting." Doing this helps protect your incision. It also decreases belly discomfort.  If you are being admitted to the hospital overnight, leave your suitcase in the car. After surgery it may be brought to your room.  If you are being discharged the day of surgery, you will not be allowed to drive home. You will need a responsible adult (18 years or older) to drive you home and stay with you that night.   If you are taking public transportation, you will need to have a responsible adult (18 years or older) with you. Please confirm with your physician that it is acceptable to use public transportation.  Please call the [facility] Dept. at [phone] if you have any questions about these instructions.  Surgery Visitation Policy:  Patients undergoing a surgery or procedure may have two family members or support persons with them as long as the person is not COVID-19 positive or experiencing its symptoms.   Inpatient Visitation:    Visiting hours are 7 a.m. to 8 p.m. Up to four visitors are allowed at one time in a  patient room, including children. The visitors may rotate out with other people during the day. One designated support person (adult) may remain overnight.

## 2021-09-30 NOTE — Pre-Procedure Instructions (Signed)
Pre- admission appointment completed with [name] - nurse at [name] where patient resides, instructions reviewed and faxed to care home. Allergies, medication and patients history reviewed and updated with [name]. Verbal consent completed over the phone with patients legal guardian Anselm Jungling, verified/witnessed with 2 Rns.Marland Kitchen

## 2021-10-04 ENCOUNTER — Other Ambulatory Visit: Payer: Self-pay | Admitting: Urology

## 2021-10-04 ENCOUNTER — Encounter
Admission: RE | Disposition: A | Discharge status: Other Acute Inpatient Hospital | Payer: Self-pay | Source: Home / Self Care | Attending: Urology

## 2021-10-04 ENCOUNTER — Ambulatory Visit: Payer: Medicare Other | Admitting: Certified Registered"

## 2021-10-04 ENCOUNTER — Ambulatory Visit: Payer: Medicare Other

## 2021-10-04 ENCOUNTER — Encounter: Payer: Self-pay | Admitting: Urology

## 2021-10-04 ENCOUNTER — Ambulatory Visit
Admission: RE | Admit: 2021-10-04 | Discharge: 2021-10-04 | Payer: Medicare Other | Attending: Urology | Admitting: Urology

## 2021-10-04 DIAGNOSIS — I10 Essential (primary) hypertension: Secondary | ICD-10-CM | POA: Insufficient documentation

## 2021-10-04 DIAGNOSIS — N202 Calculus of kidney with calculus of ureter: Secondary | ICD-10-CM | POA: Insufficient documentation

## 2021-10-04 DIAGNOSIS — N201 Calculus of ureter: Secondary | ICD-10-CM | POA: Diagnosis not present

## 2021-10-04 DIAGNOSIS — G809 Cerebral palsy, unspecified: Secondary | ICD-10-CM | POA: Diagnosis not present

## 2021-10-04 DIAGNOSIS — N210 Calculus in bladder: Secondary | ICD-10-CM | POA: Diagnosis not present

## 2021-10-04 DIAGNOSIS — I2510 Atherosclerotic heart disease of native coronary artery without angina pectoris: Secondary | ICD-10-CM | POA: Diagnosis not present

## 2021-10-04 DIAGNOSIS — N3289 Other specified disorders of bladder: Secondary | ICD-10-CM

## 2021-10-04 DIAGNOSIS — N211 Calculus in urethra: Secondary | ICD-10-CM | POA: Diagnosis not present

## 2021-10-04 DIAGNOSIS — N200 Calculus of kidney: Secondary | ICD-10-CM

## 2021-10-04 HISTORY — PX: CYSTOSCOPY W/ RETROGRADES: SHX1426

## 2021-10-04 HISTORY — PX: CYSTOSCOPY/URETEROSCOPY/HOLMIUM LASER/STENT PLACEMENT: SHX6546

## 2021-10-04 SURGERY — CYSTOSCOPY/URETEROSCOPY/HOLMIUM LASER/STENT PLACEMENT
Anesthesia: General | Site: Ureter | Laterality: Left

## 2021-10-04 MED ORDER — GENTAMICIN SULFATE 40 MG/ML IJ SOLN
5.0000 mg/kg | INTRAVENOUS | Status: AC
  Administered 2021-10-04: 320 mg via INTRAVENOUS
  Filled 2021-10-04: qty 8

## 2021-10-04 MED ORDER — CHLORHEXIDINE GLUCONATE 0.12 % MT SOLN
OROMUCOSAL | Status: AC
  Filled 2021-10-04: qty 15

## 2021-10-04 MED ORDER — STERILE WATER FOR IRRIGATION IR SOLN
Status: DC | PRN
  Administered 2021-10-04: 500 mL

## 2021-10-04 MED ORDER — LACTATED RINGERS IV SOLN: INTRAVENOUS | Status: DC

## 2021-10-04 MED ORDER — ORAL CARE MOUTH RINSE: 15.0000 mL | Freq: Once | OROMUCOSAL | Status: AC

## 2021-10-04 MED ORDER — LIDOCAINE HCL (CARDIAC) PF 100 MG/5ML IV SOSY
PREFILLED_SYRINGE | INTRAVENOUS | Status: DC | PRN
  Administered 2021-10-04: 50 mg via INTRAVENOUS

## 2021-10-04 MED ORDER — TRAMADOL HCL 50 MG PO TABS: 50.0000 mg | ORAL_TABLET | Freq: Four times a day (QID) | ORAL | 0 refills | Status: DC | PRN

## 2021-10-04 MED ORDER — PROPOFOL 10 MG/ML IV BOLUS
INTRAVENOUS | Status: DC | PRN
  Administered 2021-10-04: 100 mg via INTRAVENOUS

## 2021-10-04 MED ORDER — SODIUM CHLORIDE 0.9 % IV SOLN
1.0000 g | INTRAVENOUS | Status: AC
  Administered 2021-10-04: 1 g via INTRAVENOUS
  Filled 2021-10-04: qty 1

## 2021-10-04 MED ORDER — GLYCOPYRROLATE 0.2 MG/ML IJ SOLN
INTRAMUSCULAR | Status: DC | PRN
  Administered 2021-10-04: .2 mg via INTRAVENOUS

## 2021-10-04 MED ORDER — FENTANYL CITRATE (PF) 100 MCG/2ML IJ SOLN
INTRAMUSCULAR | Status: DC | PRN
Start: 2021-10-04 — End: 2021-10-04
  Administered 2021-10-04: 50 ug via INTRAVENOUS

## 2021-10-04 MED ORDER — IOHEXOL 180 MG/ML  SOLN
INTRAMUSCULAR | Status: DC | PRN
  Administered 2021-10-04 (×2): 20 mL

## 2021-10-04 MED ORDER — TRAMADOL HCL 50 MG PO TABS
50.0000 mg | ORAL_TABLET | Freq: Four times a day (QID) | ORAL | 0 refills | Status: DC | PRN
Start: 2021-10-04 — End: 2021-10-14

## 2021-10-04 MED ORDER — CHLORHEXIDINE GLUCONATE 0.12 % MT SOLN
15.0000 mL | Freq: Once | OROMUCOSAL | Status: AC
  Administered 2021-10-04: 15 mL via OROMUCOSAL

## 2021-10-04 MED ORDER — SODIUM CHLORIDE 0.9 % IR SOLN
Status: DC | PRN
  Administered 2021-10-04 (×3): 3000 mL

## 2021-10-04 MED ORDER — DEXAMETHASONE SODIUM PHOSPHATE 10 MG/ML IJ SOLN
INTRAMUSCULAR | Status: DC | PRN
  Administered 2021-10-04: 8 mg via INTRAVENOUS

## 2021-10-04 MED ORDER — FENTANYL CITRATE (PF) 100 MCG/2ML IJ SOLN
INTRAMUSCULAR | Status: AC
  Filled 2021-10-04: qty 2

## 2021-10-04 MED ORDER — ONDANSETRON HCL 4 MG/2ML IJ SOLN
INTRAMUSCULAR | Status: DC | PRN
  Administered 2021-10-04: 4 mg via INTRAVENOUS

## 2021-10-04 MED ORDER — FAMOTIDINE 20 MG PO TABS
ORAL_TABLET | ORAL | Status: AC
  Filled 2021-10-04: qty 1

## 2021-10-04 MED ORDER — FAMOTIDINE 20 MG PO TABS
20.0000 mg | ORAL_TABLET | Freq: Once | ORAL | Status: AC
  Administered 2021-10-04: 20 mg via ORAL

## 2021-10-04 MED ORDER — MIDAZOLAM HCL 2 MG/2ML IJ SOLN
INTRAMUSCULAR | Status: AC
  Filled 2021-10-04: qty 2

## 2021-10-04 MED ORDER — PHENYLEPHRINE HCL (PRESSORS) 10 MG/ML IV SOLN
INTRAVENOUS | Status: DC | PRN
  Administered 2021-10-04 (×2): 160 ug via INTRAVENOUS
  Administered 2021-10-04 (×2): 80 ug via INTRAVENOUS

## 2021-10-04 SURGICAL SUPPLY — 28 items
BAG DRAIN CYSTO-URO LG1000N (MISCELLANEOUS) ×2 IMPLANT
BASKET ZERO TIP 1.9FR (BASKET) ×1 IMPLANT
BRUSH SCRUB EZ 1% IODOPHOR (MISCELLANEOUS) ×2 IMPLANT
BSKT STON RTRVL ZERO TP 1.9FR (BASKET) ×1
CATH COUDE FOLEY 2W 5CC 16FR (CATHETERS) IMPLANT
CATH FOL 2WAY LX 16X30 (CATHETERS) ×1 IMPLANT
CATH FOL LX CONE TIP  8F (CATHETERS) ×1
CATH FOL LX CONE TIP 8F (CATHETERS) IMPLANT
CATH URET FLEX-TIP 2 LUMEN 10F (CATHETERS) IMPLANT
CATH URETL OPEN END 6X70 (CATHETERS) ×2 IMPLANT
DRAPE UTILITY 15X26 TOWEL STRL (DRAPES) ×2 IMPLANT
FIBER LASER MOSES 200 DFL (Laser) ×1 IMPLANT
GLOVE SURG UNDER POLY LF SZ7.5 (GLOVE) ×2 IMPLANT
GOWN STRL REUS W/ TWL LRG LVL3 (GOWN DISPOSABLE) ×1 IMPLANT
GOWN STRL REUS W/ TWL XL LVL3 (GOWN DISPOSABLE) ×1 IMPLANT
GOWN STRL REUS W/TWL LRG LVL3 (GOWN DISPOSABLE) ×2
GOWN STRL REUS W/TWL XL LVL3 (GOWN DISPOSABLE) ×2
GUIDEWIRE STR DUAL SENSOR (WIRE) ×2 IMPLANT
IV NS IRRIG 3000ML ARTHROMATIC (IV SOLUTION) ×4 IMPLANT
KIT TURNOVER CYSTO (KITS) ×2 IMPLANT
PACK CYSTO AR (MISCELLANEOUS) ×2 IMPLANT
SET CYSTO W/LG BORE CLAMP LF (SET/KITS/TRAYS/PACK) ×2 IMPLANT
SHEATH NAVIGATOR HD 12/14X36 (SHEATH) IMPLANT
STENT URET 6FRX24 CONTOUR (STENTS) ×1 IMPLANT
SURGILUBE 2OZ TUBE FLIPTOP (MISCELLANEOUS) ×2 IMPLANT
TRACTIP FLEXIVA PULSE ID 200 (Laser) ×1 IMPLANT
VALVE UROSEAL ADJ ENDO (VALVE) IMPLANT
WATER STERILE IRR 500ML POUR (IV SOLUTION) ×2 IMPLANT

## 2021-10-04 NOTE — Interval H&P Note (Signed)
History and Physical Interval Note:  81 y.o. male with a large left distal ureteral calculus and bilateral renal calculi.  Scheduled for bilateral ureteroscopy, laser lithotripsy and stone removal with stent placement.  Discussed the possible need for a staged procedure with patient family.  All questions were answered and they desire to proceed.  CV: RRR Lungs: Clear  10/04/2021 8:06 AM  [name]  has presented today for surgery, with the diagnosis of Left Ureteral Stone, Bilateral Renal Stones,.  The various methods of treatment have been discussed with the patient and family. After consideration of risks, benefits and other options for treatment, the patient has consented to  Procedure(s): CYSTOSCOPY/URETEROSCOPY/HOLMIUM LASER/STENT PLACEMENT (Bilateral) CYSTOSCOPY WITH RETROGRADE PYELOGRAM (Bilateral) as a surgical intervention.  [name] history has been reviewed, patient examined, no change in status, stable for surgery.  I have reviewed the patient's chart and labs.  Questions were answered to the patient's satisfaction.     Page

## 2021-10-04 NOTE — Op Note (Signed)
Preoperative diagnosis:  Left ureteral calculus Bilateral nephrolithiasis Prostatic calculi  Postoperative diagnosis:  Same  Procedure: Cystoscopy Left ureteroscopy with stone removal Laser lithotripsy Cystolitholapaxy (>2.5 cm) Left ureteral stent placement  Surgeon: Abbie Sons, MD  Anesthesia: General  Complications: None  Intraoperative findings:  Cystoscopy-wide caliber bulbar stricture.  Open prostatic fossa with 2 calculi each measuring ~ 1 cm.  3 bladder calculi; largest measuring 1.5 cm with the other 2 each measuring ~ 1 cm.  No solid or papillary tumors Left ureteroscopy-18 mm left distal ureteral calculus Left retrograde pyelogram post procedure showed no contrast extravasation Significant contractures making positioning and stone treatment challenging.  The left hip and knee were frozen.  EBL: Minimal  Specimens: None  Indication: [name] is a 81 y.o. cerebral palsy and a history of abdominal pain and CT showing a large left distal ureteral calculus with mild-moderate hydronephrosis/hydroureter.  Informed consent has been obtained with his HCPOA.  Initially planned to undergo bilateral ureteroscopy with stent placement, possibly a staged procedure however the difficulty with positioning and multiple prostatic/bladder calculi requiring treatment altered the above plan.  Description of procedure:  The patient was taken to the operating room and general anesthesia was induced.  At the time of positioning he was noted to have significant contractures.  His left hip and knee were frozen and could only be elevated and lowered.  The right lower extremity could only undergo mild flexion making a narrow working window.  A preoperative time-out was performed.   A 21 French cystoscope was lubricated, passed per urethra and advanced proximally into the bladder under direct vision.  Panendoscopy was then performed with findings as described above.  A 0.038 Sensor  wire was placed through the working channel of the cystoscope and into the left ureteral orifice however could not be advanced proximal to the calculus.  The Sensor wire was replaced with a 6 [place] open-ended ureteral catheter positioned at the left UO followed by placement of a 0.038 Zip-wire which was able to be advanced into the region of the renal pelvis.  The cystoscope was removed and a 4.5 French semirigid ureteroscope was passed per urethra into the bladder.  The left UO was easily engaged with ureteroscope and the calculus was identified.  A 200 m Moses laser fiber was then placed through the ureteroscope and this calculus was dusted at an initial setting of 0.3J/80 Hz and increased to 0.3J/120 Hz.  Significantly sized stone fragments were removed with a 1.9 [place] nitinol basket.  Once all fragments were clear the ureteroscope was advanced to the proximal ureter and no additional calculi or fragments were seen.  Retrograde pyelogram was performed through the ureteroscope with findings as described above.  A 33F/24 cm Contour ureteral stent was then placed under fluoroscopic guidance with good positioning noted proximally and distally under fluoroscopy.  A 24 French continuous-flow resectoscope sheath with visual obturator was then placed per urethra and advanced under direct vision to the prostatic fossa.  The 2 prostatic calculi were dusted at a setting of 0.3J/120 Hz and decreased to 0.3/80 as the calculi decreased in size.  The 3 additional bladder calculi were treated in the similar fashion.  All calculus fragments were removed via irrigation.  Panendoscopy then performed and no calculus fragments fine residual other than powder was seen.  His positioning was going to make right ureteroscopy difficult.  His right renal calculus is nonobstructing and it was elected not to treat.  All instruments were removed.  A 16 French coud catheter was placed without difficulty and the balloon inflated  with 15 mL of sterile water.  Plan: Catheter may be removed 10/05/2021 Follow-up office visit 2 weeks for cystoscopy with stent removal   Abbie Sons, M.D.

## 2021-10-04 NOTE — Discharge Instructions (Addendum)
DISCHARGE INSTRUCTIONS FOR KIDNEY STONE/URETERAL STENT   MEDICATIONS:  1. Resume all your other meds from home.  2.  Tramadol is for moderate pain, Rx was sent to pharmacy.  ACTIVITY:  1.  As tolerated  SIGNS/SYMPTOMS TO CALL:   Please call us if you have a fever greater than 101.5, uncontrolled nausea/vomiting, uncontrolled pain, dizziness, unable to urinate, excessively bloody urine, chest pain, shortness of breath, leg swelling, leg pain, or any other concerns or questions.   You can reach [place] at [phone].   FOLLOW-UP:  1. You will be contacted for an appointment to be scheduled next week for stent removal 2.  The Foley catheter may be removed a.m. 10/05/2021  AMBULATORY SURGERY  DISCHARGE INSTRUCTIONS   The drugs that you were given will stay in your system until tomorrow so for the next 24 hours you should not:  Drive an automobile Make any legal decisions Drink any alcoholic beverage   You may resume regular meals tomorrow.  Today it is better to start with liquids and gradually work up to solid foods.  You may eat anything you prefer, but it is better to start with liquids, then soup and crackers, and gradually work up to solid foods.   Please notify your doctor immediately if you have any unusual bleeding, trouble breathing, redness and pain at the surgery site, drainage, fever, or pain not relieved by medication.    Additional Instructions:        Please contact your physician with any problems or Same Day Surgery at [phone], Monday through Friday 6 am to 4 pm, or [place] at [facility] number at [phone].

## 2021-10-04 NOTE — Anesthesia Preprocedure Evaluation (Signed)
Anesthesia Evaluation  Patient identified by MRN, date of birth, ID band Patient confused    Reviewed: Allergy & Precautions, H&P , NPO status , Patient's Chart, lab work & pertinent test results, reviewed documented beta blocker date and time   History of Anesthesia Complications Negative for: history of anesthetic complications  Airway Mallampati: III  TM Distance: >3 FB Neck ROM: full    Dental  (+) Dental Advidsory Given, Edentulous Upper, Edentulous Lower   Pulmonary pneumonia, resolved,    Pulmonary exam normal        Cardiovascular Exercise Tolerance: Poor hypertension, Pt. on medications (-) angina+ CAD and + Peripheral Vascular Disease  (-) Past MI, (-) Cardiac Stents and (-) CABG Normal cardiovascular exam(-) dysrhythmias (-) Valvular Problems/Murmurs     Neuro/Psych neg Seizures PSYCHIATRIC DISORDERS Anxiety Depression Dementia Cerebral palsy and mental retardation  Neuromuscular disease    GI/Hepatic negative GI ROS, Neg liver ROS,   Endo/Other  negative endocrine ROS  Renal/GU Renal disease (kidney stones and cysts)  negative genitourinary   Musculoskeletal   Abdominal Normal abdominal exam  (+)   Peds  Hematology negative hematology ROS (+)   Anesthesia Other Findings Past Medical History: No date: Anxiety No date: Atrophy, disuse, muscle No date: BPH (benign prostatic hyperplasia) No date: Cerebral palsy (HCC) No date: Eczema No date: Elevated lipids No date: Elevated PSA No date: Exotropia No date: Hematuria, gross No date: Hyperlipidemia No date: Incontinence No date: Kidney stone No date: Lower extremity edema     Comment:  lymph drainage problems No date: Lower urinary tract symptoms No date: Lymphedema No date: Mental retardation No date: Over weight No date: Prostate cancer ([place]) No date: Urinary frequency No date: Urinary urgency No date: UTI (urinary tract infection) No date:  Venous insufficiency No date: Vitamin D deficiency No date: Wheelchair dependence   Reproductive/Obstetrics negative OB ROS                             Anesthesia Physical  Anesthesia Plan  ASA: 3  Anesthesia Plan: General   Post-op Pain Management: Ofirmev IV (intra-op)*   Induction: Intravenous  PONV Risk Score and Plan: 3 and Ondansetron, Dexamethasone and Treatment may vary due to age or medical condition  Airway Management Planned: LMA  Additional Equipment: None  Intra-op Plan:   Post-operative Plan: Extubation in OR  Informed Consent: I have reviewed the patients History and Physical, chart, labs and discussed the procedure including the risks, benefits and alternatives for the proposed anesthesia with the patient or authorized representative who has indicated his/her understanding and acceptance.     Dental Advisory Given and Consent reviewed with POA  Plan Discussed with: Anesthesiologist, CRNA and Surgeon  Anesthesia Plan Comments: (Discussed risks of anesthesia with patient's legal guardian Anselm Jungling at bedside, including PONV, sore throat, lip/dental/eye damage. Rare risks discussed as well, such as cardiorespiratory and neurological sequelae, and allergic reactions. Discussed the role of CRNA in patient's perioperative care. She understands.)        Anesthesia Quick Evaluation

## 2021-10-04 NOTE — Anesthesia Procedure Notes (Signed)
Procedure Name: LMA Insertion Date/Time: 10/04/2021 8:28 AM  Performed by: Beverely Low, CRNAPre-anesthesia Checklist: Patient identified, Patient being monitored, Timeout performed, Emergency Drugs available and Suction available Patient Re-evaluated:Patient Re-evaluated prior to induction Oxygen Delivery Method: Circle system utilized Preoxygenation: Pre-oxygenation with 100% oxygen Induction Type: IV induction Ventilation: Mask ventilation without difficulty LMA: LMA inserted LMA Size: 4.5 Tube type: Oral Number of attempts: 1 Placement Confirmation: positive ETCO2 and breath sounds checked- equal and bilateral Tube secured with: Tape Dental Injury: Teeth and Oropharynx as per pre-operative assessment

## 2021-10-04 NOTE — Transfer of Care (Addendum)
Immediate Anesthesia Transfer of Care Note  Patient: [name]  Procedure(s) Performed: CYSTOSCOPY/URETEROSCOPY/HOLMIUM LASER/STENT PLACEMENT (Bilateral) CYSTOSCOPY WITH RETROGRADE PYELOGRAM (Bilateral)  Patient Location: PACU  Anesthesia Type:General  Level of Consciousness: drowsy  Airway & Oxygen Therapy: Patient Spontanous Breathing and Patient connected to face mask oxygen  Post-op Assessment: Report given to RN and Post -op Vital signs reviewed and stable  Post vital signs: Reviewed and stable  Last Vitals:  Vitals Value Taken Time  BP    Temp    Pulse    Resp    SpO2      Last Pain:  Vitals:   10/04/21 0735  TempSrc: Temporal         Complications: No notable events documented.

## 2021-10-04 NOTE — Anesthesia Postprocedure Evaluation (Signed)
Anesthesia Post Note  Patient: [name]  Procedure(s) Performed: CYSTOSCOPY/URETEROSCOPY/HOLMIUM LASER/STENT PLACEMENT/ LITHOPAXY (Left: Ureter) CYSTOSCOPY WITH RETROGRADE PYELOGRAM (Left: Ureter)  Patient location during evaluation: PACU Anesthesia Type: General Level of consciousness: awake Pain management: pain level controlled Vital Signs Assessment: post-procedure vital signs reviewed and stable Respiratory status: spontaneous breathing, nonlabored ventilation, respiratory function stable and patient connected to nasal cannula oxygen Cardiovascular status: blood pressure returned to baseline and stable Postop Assessment: no apparent nausea or vomiting Anesthetic complications: no   No notable events documented.   Last Vitals:  Vitals:   10/04/21 1121 10/04/21 1147  BP: (!) 142/75 (!) 141/78  Pulse: (!) 54 (!) 55  Resp: 16 16  Temp: (!) 36 C   SpO2: 100% 100%    Last Pain:  Vitals:   10/04/21 1121  TempSrc: Temporal  PainSc:                  [name]

## 2021-10-11 ENCOUNTER — Other Ambulatory Visit: Payer: Self-pay

## 2021-10-11 ENCOUNTER — Other Ambulatory Visit: Payer: Medicare Other

## 2021-10-11 ENCOUNTER — Telehealth: Payer: Self-pay | Admitting: Urology

## 2021-10-11 DIAGNOSIS — R310 Gross hematuria: Secondary | ICD-10-CM

## 2021-10-11 LAB — URINALYSIS, COMPLETE
Bilirubin, UA: NEGATIVE
Glucose, UA: NEGATIVE
Ketones, UA: NEGATIVE
Nitrite, UA: NEGATIVE
Specific Gravity, UA: 1.025 (ref 1.005–1.030)
Urobilinogen, Ur: 0.2 mg/dL (ref 0.2–1.0)
pH, UA: 5.5 (ref 5.0–7.5)

## 2021-10-11 LAB — MICROSCOPIC EXAMINATION
RBC, Urine: >30 /hpf — AB (ref 0–2)
WBC, UA: >30 /hpf — AB (ref 0–5)

## 2021-10-11 MED ORDER — SULFAMETHOXAZOLE-TRIMETHOPRIM 800-160 MG PO TABS: 1.0000 | ORAL_TABLET | Freq: Two times a day (BID) | ORAL | 0 refills | Status: DC

## 2021-10-11 NOTE — Telephone Encounter (Signed)
Will you call [name] and tell her I like to get [facility] [place] started on Septra DS twice daily for 14 days while we wait on culture results?

## 2021-10-11 NOTE — Telephone Encounter (Signed)
Notified [name] as advised. Sent in pts ABX Rx. She expressed understanding.

## 2021-10-11 NOTE — Telephone Encounter (Signed)
Mr. Laura caregiver, [name], states that yesterday he was urinating blood and had a low-grade fever of 100.5.  Today, he has not been himself, but he has had no fever.  She states that he is also been exhibiting signs of pain and was given tramadol yesterday.  She brought in a urine specimen in a specimen cup and we will run it for urinalysis and send it for culture.

## 2021-10-13 NOTE — Progress Notes (Unsigned)
Name: [name]   MRN: [id number]    DOB: Jun 12, 1940   Date:10/13/2021       Progress Note  Subjective  Chief Complaint  Sore- Left Heel  HPI  *** Patient Active Problem List   Diagnosis Date Noted   Urinary incontinence 05/26/2021   Hypotension 05/20/2021   History of rectal bleeding 05/09/2020   Hematochezia    BPH (benign prostatic hyperplasia)    Hyperlipidemia    Depression with anxiety    Personal history of urinary calculi 06/21/2019   PAD (peripheral artery disease) ([place]) 07/04/2017   Aortic valve calcification 07/04/2017   Coronary artery calcification 07/04/2017   Late effect acute polio 05/17/2016   Chronic constipation 11/19/2015   Mild major depression ([place]) 11/19/2015   Bacteriuria, chronic 09/11/2015   History of prostate cancer 03/02/2015   Amyotrophia 03/02/2015   Edema leg 03/02/2015   Cerebral palsy ([place]) 03/02/2015   Dyslipidemia 03/02/2015   Dermatitis, eczematoid 03/02/2015   Divergent squint 03/02/2015   H/O acute poliomyelitis 03/02/2015   Lymphedema 03/02/2015   Intellectual disability 03/02/2015   Hypertrophy of nail 03/02/2015   Chronic venous insufficiency 03/02/2015   Avitaminosis D 03/02/2015   Adynamia 03/02/2015   Dependent on wheelchair 03/02/2015   Renal cysts, acquired, bilateral 03/02/2015   At risk for falling 03/02/2015   Cerebral vascular disease 03/02/2015   Strabismus 02/11/2015    Past Surgical History:  Procedure Laterality Date   COLONOSCOPY WITH PROPOFOL N/A 05/10/2020   Procedure: COLONOSCOPY WITH PROPOFOL;  Surgeon: Lin Landsman, MD;  Location: [facility] ENDOSCOPY;  Service: Gastroenterology;  Laterality: N/A;   COLONOSCOPY WITH PROPOFOL N/A 05/12/2020   Procedure: COLONOSCOPY WITH PROPOFOL;  Surgeon: Lin Landsman, MD;  Location: [facility] ENDOSCOPY;  Service: Gastroenterology;  Laterality: N/A;   CYSTOSCOPY W/ RETROGRADES Left 10/04/2021   Procedure: CYSTOSCOPY WITH RETROGRADE PYELOGRAM;  Surgeon: Abbie Sons, MD;  Location: [facility] ORS;  Service: Urology;  Laterality: Left;   CYSTOSCOPY/URETEROSCOPY/HOLMIUM LASER/STENT PLACEMENT Right 08/14/2017   Procedure: CYSTOSCOPY/URETEROSCOPY/HOLMIUM LASER/STENT PLACEMENT;  Surgeon: Abbie Sons, MD;  Location: [facility] ORS;  Service: Urology;  Laterality: Right;   CYSTOSCOPY/URETEROSCOPY/HOLMIUM LASER/STENT PLACEMENT Left 10/04/2021   Procedure: CYSTOSCOPY/URETEROSCOPY/HOLMIUM LASER/STENT PLACEMENT/ LITHOPAXY;  Surgeon: Abbie Sons, MD;  Location: [facility] ORS;  Service: Urology;  Laterality: Left;   ESOPHAGOGASTRODUODENOSCOPY (EGD) WITH PROPOFOL N/A 05/12/2020   Procedure: ESOPHAGOGASTRODUODENOSCOPY (EGD) WITH PROPOFOL;  Surgeon: Lin Landsman, MD;  Location: [facility] ENDOSCOPY;  Service: Gastroenterology;  Laterality: N/A;   INCISION AND DRAINAGE ABSCESS Left 07/05/2021   Procedure: INCISION AND DRAINAGE SCROTAL WALL ABSCESS;  Surgeon: Abbie Sons, MD;  Location: [facility] ORS;  Service: Urology;  Laterality: Left;   leg circulation surgery Right    PROSTATE SURGERY  11/10/2008   BRACHYTHERAPY   SCROTAL EXPLORATION Left 07/05/2021   Procedure: SCROTUM EXPLORATION;  Surgeon: Abbie Sons, MD;  Location: [facility] ORS;  Service: Urology;  Laterality: Left;    Family History  Problem Relation Age of Onset   Heart attack Brother    Heart disease Other     Social History   Tobacco Use   Smoking status: Never   Smokeless tobacco: Never  Substance Use Topics   Alcohol use: No    Alcohol/week: 0.0 standard drinks of alcohol     Current Outpatient Medications:    alfuzosin (UROXATRAL) 10 MG 24 hr tablet, TAKE 1 TABLET BY MOUTH DAILY, Disp: 90 tablet, Rfl: 1   ALPRAZolam (XANAX) 0.25 MG tablet, TAKE ONE TABLET  BY MOUTH 2 TIMES A DAY AS NEEDED FOR ANXIETY (WHEN BLOOD PRESSURE IS VERY HIGH OR ANXIOUS) (Patient taking differently: Take 0.25 mg by mouth daily as needed (agitation (crisis med)).), Disp: 20 tablet, Rfl: 0   ARTIFICIAL TEARS 0.2-0.2-1 %  SOLN, PLACE 2 DROPS INTO EYE 2 TIMES A DAY AS NEEDED, Disp: 15 mL, Rfl: 2   atorvastatin (LIPITOR) 40 MG tablet, TAKE 1 TABLET BY MOUTH EVERY EVENING, Disp: 90 tablet, Rfl: 1   augmented betamethasone dipropionate (DIPROLENE-AF) 0.05 % cream, APPLY TOPICALLY AS DIRECTED TO RASH AS NEEDED, Disp: 45 g, Rfl: 0   Calcium Carb-Cholecalciferol 500-10 MG-MCG TABS, TAKE 1 TABLET BY MOUTH 2 TIMES A DAY, Disp: 180 tablet, Rfl: 0   Calcium Carb-Cholecalciferol 500-10 MG-MCG TABS, Take 1 tablet by mouth 2 (two) times daily., Disp: 180 tablet, Rfl: 0   Cranberry Juice Powder 425 MG CAPS, TAKE 1 CAPSULE BY MOUTH ONCE DAILY, Disp: 90 capsule, Rfl: 1   Elastic Bandages & Supports (MEDICAL COMPRESSION STOCKINGS) MISC, 1 Units by Does not apply route daily., Disp: 2 each, Rfl: 0   GNP VITAMIN C 500 MG tablet, TAKE ONE TABLET BY MOUTH ONCE DAILY, Disp: 100 tablet, Rfl: 1   Incontinence Supply Disposable (DEPEND SILHOUETTE BRIEFS L/XL) MISC, 1 each by Does not apply route 2 (two) times daily as needed., Disp: 60 each, Rfl: 5   Iron, Ferrous Sulfate, 325 (65 Fe) MG TABS, Take 325 mg by mouth daily., Disp: 30 tablet, Rfl: 3   lubiprostone (AMITIZA) 24 MCG capsule, TAKE 1 CAPSULE BY MOUTH 2 TIMES DAILY WITH A MEAL, Disp: 60 capsule, Rfl: 5   Nutritional Supplements (ENSURE ORIGINAL) LIQD, Take 1 Can by mouth 3 (three) times daily., Disp: 5.688 mL, Rfl: 5   nystatin cream (MYCOSTATIN), APPLY 1 APPLICATION TOPICALLY 2 TIMES DAILY, Disp: 30 g, Rfl: 0   Polyethyl Glycol-Propyl Glycol (SYSTANE ULTRA OP), Place 1 drop into both eyes 2 (two) times daily as needed (dry eyes)., Disp: , Rfl:    sertraline (ZOLOFT) 100 MG tablet, TAKE 1 TABLET BY MOUTH DAILY, Disp: 90 tablet, Rfl: 1   sulfamethoxazole-trimethoprim (BACTRIM DS) 800-160 MG tablet, Take 1 tablet by mouth 2 (two) times daily for 14 days., Disp: 28 tablet, Rfl: 0   traMADol (ULTRAM) 50 MG tablet, Take 1 tablet (50 mg total) by mouth every 6 (six) hours as needed for  moderate pain., Disp: 8 tablet, Rfl: 0  No Known Allergies  I personally reviewed active problem list, medication list, allergies, family history, social history, health maintenance with the patient/caregiver today.   ROS  ***  Objective  There were no vitals filed for this visit.  There is no height or weight on file to calculate BMI.  Physical Exam ***  Recent Results (from the past 2160 hour(s))  VITAMIN D 25 Hydroxy (Vit-D Deficiency, Fractures)     Status: None   Collection Time: 08/15/21 10:01 AM  Result Value Ref Range   Vit D, 25-Hydroxy 50 30 - 100 ng/mL    Comment: Vitamin D Status         25-OH Vitamin D: . Deficiency:                    <20 ng/mL Insufficiency:             20 - 29 ng/mL Optimal:                 > or = 30 ng/mL . For 25-OH Vitamin  D testing on patients on  D2-supplementation and patients for whom quantitation  of D2 and D3 fractions is required, the QuestAssureD(TM) 25-OH VIT D, (D2,D3), LC/MS/MS is recommended: order  code [phone] (patients >70yr). See Note 1 . Note 1 . For additional information, please refer to  http://education.[facility].com/faq/FAQ199  (This link is being provided for informational/ educational purposes only.)   COMPLETE METABOLIC PANEL WITH GFR     Status: Abnormal   Collection Time: 08/15/21 10:01 AM  Result Value Ref Range   Glucose, Bld 102 (H) 65 - 99 mg/dL    Comment: .            Fasting reference interval . For someone without known diabetes, a glucose value between 100 and 125 mg/dL is consistent with prediabetes and should be confirmed with a follow-up test. .    BUN 32 (H) 7 - 25 mg/dL   Creat 0.74 0.70 - 1.28 mg/dL   eGFR 93 > OR = 60 mL/min/1.718m   Comment: The eGFR is based on the CKD-EPI 2021 equation. To calculate  the new eGFR from a previous Creatinine or Cystatin C result, go to https://www.kidney.org/professionals/ kdoqi/gfr%5Fcalculator    BUN/Creatinine Ratio 43 (H) 6 - 22  (calc)   Sodium 144 135 - 146 mmol/L   Potassium 3.8 3.5 - 5.3 mmol/L   Chloride 109 98 - 110 mmol/L   CO2 29 20 - 32 mmol/L   Calcium 9.2 8.6 - 10.3 mg/dL   Total Protein 6.1 6.1 - 8.1 g/dL   Albumin 3.5 (L) 3.6 - 5.1 g/dL   Globulin 2.6 1.9 - 3.7 g/dL (calc)   AG Ratio 1.3 1.0 - 2.5 (calc)   Total Bilirubin 0.3 0.2 - 1.2 mg/dL   Alkaline phosphatase (APISO) 126 35 - 144 U/L   AST 15 10 - 35 U/L   ALT 14 9 - 46 U/L  CBC with Differential/Platelet     Status: Abnormal   Collection Time: 08/15/21 10:01 AM  Result Value Ref Range   WBC 6.2 3.8 - 10.8 Thousand/uL   RBC 3.61 (L) 4.20 - 5.80 Million/uL   Hemoglobin 11.8 (L) 13.2 - 17.1 g/dL   HCT 34.6 (L) 38.5 - 50.0 %   MCV 95.8 80.0 - 100.0 fL   MCH 32.7 27.0 - 33.0 pg   MCHC 34.1 32.0 - 36.0 g/dL   RDW 12.7 11.0 - 15.0 %   Platelets 176 140 - 400 Thousand/uL   MPV 10.0 7.5 - 12.5 fL   Neutro Abs 4,867 1,500 - 7,800 cells/uL   Lymphs Abs 862 850 - 3,900 cells/uL   Absolute Monocytes 391 200 - 950 cells/uL   Eosinophils Absolute 62 15 - 500 cells/uL   Basophils Absolute 19 0 - 200 cells/uL   Neutrophils Relative % 78.5 %   Total Lymphocyte 13.9 %   Monocytes Relative 6.3 %   Eosinophils Relative 1.0 %   Basophils Relative 0.3 %  B12 and Folate Panel     Status: None   Collection Time: 08/15/21 10:01 AM  Result Value Ref Range   Vitamin B-12 495 200 - 1,100 pg/mL   Folate >24.0 ng/mL    Comment:                            Reference Range  Low:           <3.4                            Borderline:    3.4-5.4                            Normal:        >5.4 .   Iron, TIBC and Ferritin Panel     Status: None   Collection Time: 08/15/21 10:01 AM  Result Value Ref Range   Iron 79 50 - 180 mcg/dL   TIBC 288 250 - 425 mcg/dL (calc)   %SAT 27 20 - 48 % (calc)   Ferritin 28 24 - 380 ng/mL  Lipid panel     Status: None   Collection Time: 08/15/21 10:01 AM  Result Value Ref Range   Cholesterol 94 <200  mg/dL   HDL 47 > OR = 40 mg/dL   Triglycerides 53 <150 mg/dL   LDL Cholesterol (Calc) 34 mg/dL (calc)    Comment: Reference range: <100 . Desirable range <100 mg/dL for primary prevention;   <70 mg/dL for patients with CHD or diabetic patients  with > or = 2 CHD risk factors. Marland Kitchen LDL-C is now calculated using the Martin-Hopkins  calculation, which is a validated novel method providing  better accuracy than the Friedewald equation in the  estimation of LDL-C.  Cresenciano Genre et al. Annamaria Helling. 3790;240(97): 2061-2068  (http://education.[facility].com/faq/FAQ164)    Total CHOL/HDL Ratio 2.0 <5.0 (calc)   Non-HDL Cholesterol (Calc) 47 <130 mg/dL (calc)    Comment: For patients with diabetes plus 1 major ASCVD risk  factor, treating to a non-HDL-C goal of <100 mg/dL  (LDL-C of <70 mg/dL) is considered a therapeutic  option.   CULTURE, URINE COMPREHENSIVE     Status: Abnormal   Collection Time: 08/26/21  3:49 PM   Specimen: Urine   UR  Result Value Ref Range   Urine Culture, Comprehensive Final report (A)    Organism ID, Bacteria Proteus mirabilis (A)     Comment: Cefazolin <=4 ug/mL Cefazolin with an MIC <=16 predicts susceptibility to the oral agents cefaclor, cefdinir, cefpodoxime, cefprozil, cefuroxime, cephalexin, and loracarbef when used for therapy of uncomplicated urinary tract infections due to E. coli, Klebsiella pneumoniae, and Proteus mirabilis. Greater than 100,000 colony forming units per mL    Organism ID, Bacteria Comment     Comment: Mixed urogenital flora 25,000-50,000 colony forming units per mL    ANTIMICROBIAL SUSCEPTIBILITY Comment     Comment:       ** S = Susceptible; I = Intermediate; R = Resistant **                    P = Positive; N = Negative             MICS are expressed in micrograms per mL    Antibiotic                 RSLT#1    RSLT#2    RSLT#3    RSLT#4 Amoxicillin/Clavulanic Acid    S Ampicillin                     S Cefepime                        S Ceftriaxone  S Cefuroxime                     S Ciprofloxacin                  S Ertapenem                      S Gentamicin                     S Levofloxacin                   S Meropenem                      S Nitrofurantoin                 R Piperacillin/Tazobactam        S Tetracycline                   R Tobramycin                     S Trimethoprim/Sulfa             S   Urinalysis, Complete     Status: Abnormal   Collection Time: 08/26/21  3:49 PM  Result Value Ref Range   Specific Gravity, UA 1.010 1.005 - 1.030   pH, UA >9.0 (H) 5.0 - 7.5   Color, UA Brown (A) Yellow   Appearance Ur CANCELED     Comment: Test has been discontinued. The reagent for this assay is no longer available.  Result canceled by the ancillary.    Leukocytes,UA 3+ (A) Negative   Protein,UA 3+ (A) Negative/Trace   Glucose, UA Negative Negative   Ketones, UA Negative Negative   RBC, UA 3+ (A) Negative   Bilirubin, UA Negative Negative   Urobilinogen, Ur 1.0 0.2 - 1.0 mg/dL   Nitrite, UA Negative Negative   Microscopic Examination See below:   Microscopic Examination     Status: Abnormal   Collection Time: 08/26/21  3:49 PM   Urine  Result Value Ref Range   WBC, UA >30 (A) 0 - 5 /hpf   RBC 11-30 (A) 0 - 2 /hpf   Epithelial Cells (non renal) 0-10 0 - 10 /hpf   Crystals Present (A) N/A   Crystal Type Amorphous Sediment N/A    Comment: Triple Phosphate   Bacteria, UA Many (A) None seen/Few  Urinalysis, Complete     Status: Abnormal   Collection Time: 09/14/21  4:16 PM  Result Value Ref Range   Specific Gravity, UA 1.020 1.005 - 1.030   pH, UA 7.0 5.0 - 7.5   Color, UA Yellow Yellow   Appearance Ur Cloudy (A) Clear   Leukocytes,UA 3+ (A) Negative   Protein,UA 1+ (A) Negative/Trace   Glucose, UA Negative Negative   Ketones, UA Negative Negative   RBC, UA 2+ (A) Negative   Bilirubin, UA Negative Negative   Urobilinogen, Ur 0.2 0.2 - 1.0 mg/dL   Nitrite, UA  Positive (A) Negative   Microscopic Examination See below:   CULTURE, URINE COMPREHENSIVE     Status: None   Collection Time: 09/14/21  4:16 PM   Specimen: Urine   UR  Result Value Ref Range   Urine Culture, Comprehensive Final report    Organism ID, Bacteria Comment     Comment: More than 3 organisms recovered, none  predominant. Please submit another culture if clinically indicated. Greater than 100,000 colony forming units per mL   Microscopic Examination     Status: Abnormal   Collection Time: 09/14/21  4:16 PM   Urine  Result Value Ref Range   WBC, UA >30 (A) 0 - 5 /hpf   RBC 11-30 (A) 0 - 2 /hpf   Epithelial Cells (non renal) 0-10 0 - 10 /hpf   Casts Present (A) None seen /lpf   Cast Type Granular casts (A) N/A   Bacteria, UA Many (A) None seen/Few  Vitamin B12     Status: None   Collection Time: 09/20/21 10:39 AM  Result Value Ref Range   Vitamin B-12 603 180 - 914 pg/mL    Comment: (NOTE) This assay is not validated for testing neonatal or myeloproliferative syndrome specimens for Vitamin B12 levels. Performed at [facility], [address], [place], [place] [address]   Iron and TIBC     Status: None   Collection Time: 09/20/21 10:40 AM  Result Value Ref Range   Iron 109 45 - 182 ug/dL   TIBC 330 250 - 450 ug/dL   Saturation Ratios 33 17.9 - 39.5 %   UIBC 221 ug/dL    Comment: Performed at [facility], [address] [place], [place] [address]  Ferritin     Status: None   Collection Time: 09/20/21 10:40 AM  Result Value Ref Range   Ferritin 29 24 - 336 ng/mL    Comment: Performed at [facility], [address] [place], [place] [address]  CBC with Differential/Platelet     Status: Abnormal   Collection Time: 09/20/21 10:40 AM  Result Value Ref Range   WBC 4.3 4.0 - 10.5 K/uL   RBC 3.84 (L) 4.22 - 5.81 MIL/uL   Hemoglobin 12.5 (L) 13.0 - 17.0 g/dL   HCT 37.1 (L) 39.0 - 52.0 %   MCV 96.6 80.0 - 100.0 fL   MCH 32.6 26.0 - 34.0 pg    MCHC 33.7 30.0 - 36.0 g/dL   RDW 14.5 11.5 - 15.5 %   Platelets 146 (L) 150 - 400 K/uL   nRBC 0.0 0.0 - 0.2 %   Neutrophils Relative % 69 %   Neutro Abs 2.9 1.7 - 7.7 K/uL   Lymphocytes Relative 23 %   Lymphs Abs 1.0 0.7 - 4.0 K/uL   Monocytes Relative 7 %   Monocytes Absolute 0.3 0.1 - 1.0 K/uL   Eosinophils Relative 1 %   Eosinophils Absolute 0.1 0.0 - 0.5 K/uL   Basophils Relative 0 %   Basophils Absolute 0.0 0.0 - 0.1 K/uL   Immature Granulocytes 0 %   Abs Immature Granulocytes 0.01 0.00 - 0.07 K/uL    Comment: Performed at [facility], [address] [place], [place] [address]  CBC with Differential     Status: Abnormal   Collection Time: 09/22/21  5:31 PM  Result Value Ref Range   WBC 4.3 4.0 - 10.5 K/uL   RBC 3.85 (L) 4.22 - 5.81 MIL/uL   Hemoglobin 12.6 (L) 13.0 - 17.0 g/dL   HCT 37.1 (L) 39.0 - 52.0 %   MCV 96.4 80.0 - 100.0 fL   MCH 32.7 26.0 - 34.0 pg   MCHC 34.0 30.0 - 36.0 g/dL   RDW 14.2 11.5 - 15.5 %   Platelets 150 150 - 400 K/uL   nRBC 0.0 0.0 - 0.2 %   Neutrophils Relative % 52 %  Neutro Abs 2.3 1.7 - 7.7 K/uL   Lymphocytes Relative 36 %   Lymphs Abs 1.6 0.7 - 4.0 K/uL   Monocytes Relative 8 %   Monocytes Absolute 0.3 0.1 - 1.0 K/uL   Eosinophils Relative 2 %   Eosinophils Absolute 0.1 0.0 - 0.5 K/uL   Basophils Relative 1 %   Basophils Absolute 0.0 0.0 - 0.1 K/uL   Immature Granulocytes 1 %   Abs Immature Granulocytes 0.05 0.00 - 0.07 K/uL    Comment: Performed at [facility], [address], [place], [place] [address]  Basic metabolic panel     Status: Abnormal   Collection Time: 09/22/21  5:31 PM  Result Value Ref Range   Sodium 140 135 - 145 mmol/L   Potassium 4.2 3.5 - 5.1 mmol/L   Chloride 108 98 - 111 mmol/L   CO2 27 22 - 32 mmol/L   Glucose, Bld 91 70 - 99 mg/dL    Comment: Glucose reference range applies only to samples taken after fasting for at least 8 hours.   BUN 26 (H) 8 - 23 mg/dL   Creatinine, Ser 0.60 (L)  0.61 - 1.24 mg/dL   Calcium 9.4 8.9 - 10.3 mg/dL   GFR, Estimated >60 >60 mL/min    Comment: (NOTE) Calculated using the CKD-EPI Creatinine Equation (2021)    Anion gap 5 5 - 15    Comment: Performed at [facility], [address] [place], [place] [address]  Urinalysis, Routine w reflex microscopic Urine, Clean Catch     Status: Abnormal   Collection Time: 09/22/21  6:53 PM  Result Value Ref Range   Color, Urine AMBER (A) YELLOW    Comment: BIOCHEMICALS MAY BE AFFECTED BY COLOR   APPearance CLOUDY (A) CLEAR   Specific Gravity, Urine 1.013 1.005 - 1.030   pH 8.0 5.0 - 8.0   Glucose, UA NEGATIVE NEGATIVE mg/dL   Hgb urine dipstick LARGE (A) NEGATIVE   Bilirubin Urine NEGATIVE NEGATIVE   Ketones, ur NEGATIVE NEGATIVE mg/dL   Protein, ur 100 (A) NEGATIVE mg/dL   Nitrite POSITIVE (A) NEGATIVE   Leukocytes,Ua LARGE (A) NEGATIVE   RBC / HPF >50 (H) 0 - 5 RBC/hpf   WBC, UA >50 (H) 0 - 5 WBC/hpf   Bacteria, UA MANY (A) NONE SEEN   Squamous Epithelial / LPF 0-5 0 - 5   WBC Clumps PRESENT    Amorphous Crystal PRESENT     Comment: Performed at [facility], [address] [place], [place] [address]  CULTURE, URINE COMPREHENSIVE     Status: Abnormal (Preliminary result)   Collection Time: 10/11/21  1:04 PM   Specimen: Urine   UR  Result Value Ref Range   Urine Culture, Comprehensive Preliminary report (A)    Organism ID, Bacteria Gram negative rods (A)     Comment: Greater than 100,000 colony forming units per mL  Urinalysis, Complete     Status: Abnormal   Collection Time: 10/11/21  1:04 PM  Result Value Ref Range   Specific Gravity, UA 1.025 1.005 - 1.030   pH, UA 5.5 5.0 - 7.5   Color, UA Yellow Yellow   Appearance Ur Cloudy (A) Clear   Leukocytes,UA 3+ (A) Negative   Protein,UA 2+ (A) Negative/Trace   Glucose, UA Negative Negative   Ketones, UA Negative Negative   RBC, UA 3+ (A) Negative   Bilirubin, UA Negative Negative   Urobilinogen, Ur 0.2 0.2 -  1.0 mg/dL  Nitrite, UA Negative Negative   Microscopic Examination See below:   Microscopic Examination     Status: Abnormal   Collection Time: 10/11/21  1:04 PM   Urine  Result Value Ref Range   WBC, UA >30 (A) 0 - 5 /hpf   RBC >30 (A) 0 - 2 /hpf   Epithelial Cells (non renal) 0-10 0 - 10 /hpf   Bacteria, UA Many (A) None seen/Few    PHQ2/9:    09/15/2021   10:05 AM 08/15/2021    9:11 AM 05/26/2021   11:10 AM 05/20/2021    9:51 AM 08/19/2020    2:55 PM  Depression screen PHQ 2/9  Decreased Interest 0 0 0 0 0  Down, Depressed, Hopeless 0 0 0 0 0  PHQ - 2 Score 0 0 0 0 0  Altered sleeping  0 0 0   Tired, decreased energy  0 0 0   Change in appetite  0 0 0   Feeling bad or failure about yourself   0 0 0   Trouble concentrating  0 0 0   Moving slowly or fidgety/restless  0 0 0   Suicidal thoughts  0 0 0   PHQ-9 Score  0 0 0     phq 9 is {gen pos [id number]}   Fall Risk:    09/15/2021   10:08 AM 08/15/2021    9:11 AM 05/26/2021   11:18 AM 05/20/2021    9:51 AM 08/19/2020    2:56 PM  Fall Risk   Falls in the past year? 0 0 Exclusion - non ambulatory 0 0  Number falls in past yr: 0 0  0 0  Injury with Fall? 0 0  0 0  Risk for fall due to : Impaired mobility Impaired mobility Impaired mobility;Impaired balance/gait Impaired mobility Impaired mobility  Follow up Falls prevention discussed Falls prevention discussed Falls prevention discussed Falls prevention discussed Falls prevention discussed      Functional Status Survey:      Assessment & Plan  *** There are no diagnoses linked to this encounter.

## 2021-10-14 ENCOUNTER — Encounter: Payer: Self-pay | Admitting: Family Medicine

## 2021-10-14 ENCOUNTER — Ambulatory Visit (INDEPENDENT_AMBULATORY_CARE_PROVIDER_SITE_OTHER): Payer: Medicare Other | Admitting: Family Medicine

## 2021-10-14 ENCOUNTER — Telehealth: Payer: Self-pay

## 2021-10-14 VITALS — BP 112/74 | HR 94 | Resp 16 | Ht 70.0 in

## 2021-10-14 DIAGNOSIS — L89150 Pressure ulcer of sacral region, unstageable: Secondary | ICD-10-CM | POA: Diagnosis not present

## 2021-10-14 DIAGNOSIS — L89622 Pressure ulcer of left heel, stage 2: Secondary | ICD-10-CM

## 2021-10-14 LAB — CULTURE, URINE COMPREHENSIVE

## 2021-10-14 MED ORDER — CIPROFLOXACIN HCL 250 MG PO TABS: 250.0000 mg | ORAL_TABLET | Freq: Two times a day (BID) | ORAL | 0 refills | Status: AC

## 2021-10-14 NOTE — Telephone Encounter (Signed)
Notified pts caregiver [name] as advised. Sent pts ABX Rx to pharmacy. [name] expressed understanding and confirmed pts appt for next weeks stent removal.

## 2021-10-19 ENCOUNTER — Ambulatory Visit (INDEPENDENT_AMBULATORY_CARE_PROVIDER_SITE_OTHER): Payer: Medicare Other | Admitting: Urology

## 2021-10-19 ENCOUNTER — Encounter: Payer: Self-pay | Admitting: Urology

## 2021-10-19 VITALS — BP 95/60 | HR 61 | Ht 73.0 in | Wt 130.0 lb

## 2021-10-19 DIAGNOSIS — N201 Calculus of ureter: Secondary | ICD-10-CM | POA: Diagnosis not present

## 2021-10-19 DIAGNOSIS — Z466 Encounter for fitting and adjustment of urinary device: Secondary | ICD-10-CM

## 2021-10-19 LAB — URINALYSIS, COMPLETE
Bilirubin, UA: NEGATIVE
Glucose, UA: NEGATIVE
Ketones, UA: NEGATIVE
Nitrite, UA: NEGATIVE
Specific Gravity, UA: 1.03 (ref 1.005–1.030)
Urobilinogen, Ur: 0.2 mg/dL (ref 0.2–1.0)
pH, UA: 5 (ref 5.0–7.5)

## 2021-10-19 LAB — MICROSCOPIC EXAMINATION: WBC, UA: >30 /hpf — AB (ref 0–5)

## 2021-10-20 ENCOUNTER — Other Ambulatory Visit: Payer: Self-pay | Admitting: Family Medicine

## 2021-10-23 ENCOUNTER — Encounter: Payer: Self-pay | Admitting: Urology

## 2021-10-23 NOTE — Progress Notes (Signed)
Indications: Patient is 81 y.o., who is s/p ureteroscopic removal of a large left distal ureteral calculus and cystolitholapaxy.  The patient is presenting today for stent removal.  Procedure:  Flexible Cystoscopy with stent removal (96283)  Timeout was performed and the correct patient, procedure and participants were identified.    Description:  The patient was prepped and draped in the usual sterile fashion. Flexible cystosopy was performed.  The stent was visualized, grasped, and removed intact without difficulty. The patient tolerated the procedure well.  A single dose of oral antibiotics was given.  Complications:  None  Plan:  Instructed to call for fever or flank 74-monthfollow-up exam with SLarene Beachwith KUB

## 2021-10-27 ENCOUNTER — Encounter: Payer: Medicare Other | Attending: Physician Assistant | Admitting: Physician Assistant

## 2021-10-27 DIAGNOSIS — G808 Other cerebral palsy: Secondary | ICD-10-CM | POA: Insufficient documentation

## 2021-10-27 DIAGNOSIS — L89623 Pressure ulcer of left heel, stage 3: Secondary | ICD-10-CM | POA: Diagnosis present

## 2021-10-27 DIAGNOSIS — I7389 Other specified peripheral vascular diseases: Secondary | ICD-10-CM | POA: Insufficient documentation

## 2021-10-27 DIAGNOSIS — I87393 Chronic venous hypertension (idiopathic) with other complications of bilateral lower extremity: Secondary | ICD-10-CM | POA: Insufficient documentation

## 2021-10-27 NOTE — Progress Notes (Signed)
Kevin, Macias ([id number]) Visit Report for 10/27/2021 Chief Complaint Document Details Patient Name: [name], [name]. Date of Service: 10/27/2021 8:45 AM Medical Record Number: [id number] Patient Account Number: [id number] Date of Birth/Sex: [birth year] (81 y.o. M) Treating RN: Carlene Coria Primary Care Provider: Steele Sizer Other Clinician: Referring Provider: Steele Sizer Treating Provider/Extender: Skipper Cliche in Treatment: 0 Information Obtained from: Patient Chief Complaint Left heel pressure ulcer Electronic Signature(s) Signed: 10/27/2021 9:47:43 AM By: Worthy Keeler PA-C Entered By: Worthy Keeler on 10/27/2021 09:47:43 [name] ([id number]) -------------------------------------------------------------------------------- Debridement Details Patient Name: [name] Date of Service: 10/27/2021 8:45 AM Medical Record Number: [id number] Patient Account Number: [id number] Date of Birth/Sex: 1941-03-17 (81 y.o. M) Treating RN: Carlene Coria Primary Care Provider: Steele Sizer Other Clinician: Referring Provider: Steele Sizer Treating Provider/Extender: Skipper Cliche in Treatment: 0 Debridement Performed for Wound #1 Left Calcaneus Assessment: Performed By: Physician Tommie Sams., PA-C Debridement Type: Debridement Level of Consciousness (Pre- Awake and Alert procedure): Pre-procedure Verification/Time Out Yes - 10:05 Taken: Start Time: 10:05 Pain Control: Lidocaine 4% Topical Solution Total Area Debrided (L x W): 3 (cm) x 3 (cm) = 9 (cm) Tissue and other material Viable, Non-Viable, Subcutaneous, Skin: Dermis , Skin: Epidermis debrided: Level: Skin/Subcutaneous Tissue Debridement Description: Excisional Instrument: Scissors Bleeding: Minimum Hemostasis Achieved: Pressure End Time: 10:10 Procedural Pain: 0 Post Procedural Pain: 0 Response to Treatment: Procedure was tolerated well Level of Consciousness (Post- Awake  and Alert procedure): Post Debridement Measurements of Total Wound Length: (cm) 2 Stage: Category/Stage III Width: (cm) 3 Depth: (cm) 0.1 Volume: (cm) 0.471 Character of Wound/Ulcer Post Debridement: Improved Post Procedure Diagnosis Same as Pre-procedure Electronic Signature(s) Signed: 10/27/2021 4:21:19 PM By: Carlene Coria RN Signed: 10/27/2021 4:56:25 PM By: Worthy Keeler PA-C Entered By: Carlene Coria on 10/27/2021 10:11:01 [name] ([id number]) -------------------------------------------------------------------------------- HPI Details Patient Name: [name] Date of Service: 10/27/2021 8:45 AM Medical Record Number: [id number] Patient Account Number: [id number] Date of Birth/Sex: [birth year] (81 y.o. M) Treating RN: Carlene Coria Primary Care Provider: Steele Sizer Other Clinician: Referring Provider: Steele Sizer Treating Provider/Extender: Skipper Cliche in Treatment: 0 History of Present Illness HPI Description: 10-27-2021 upon evaluation today patient presents for initial evaluation here in the clinic concerning an issue that has been present since around June 1. Subsequently the patient's caregiver from [name] tells me that he has been treated mainly by herself she has been doing what she can to try to take care of this using DuoDERM over the area. There is a lot of skin that is actually loosen up over the top of the heel surface but not all of this is necessarily covering wound in fact there is a majority of this that is not wound related as far as the skin over top is concerned. Fortunately I do not see any signs of active infection locally or systemically at this time which is great news. He does have some slough over the surface of the wound but mainly the skin around the edges the thing I am most concerned with. It is trapping a lot of fluid to be honest. Patient does have a history of chronic venous insufficiency, cerebral palsy, and stated  peripheral vascular disease. With that being said I do not see any evidence right now of active infection which is great news. No fevers, chills, nausea, vomiting, or diarrhea. Electronic Signature(s) Signed: 10/27/2021 10:50:27 AM By: Worthy Keeler PA-C Entered By: Worthy Keeler on 10/27/2021  10:50:26 [name], [name] ([id number]) -------------------------------------------------------------------------------- Physical Exam Details Patient Name: Kevin, Macias. Date of Service: 10/27/2021 8:45 AM Medical Record Number: [id number] Patient Account Number: [id number] Date of Birth/Sex: June 25, 1940 (81 y.o. M) Treating RN: Carlene Coria Primary Care Provider: Steele Sizer Other Clinician: Referring Provider: Steele Sizer Treating Provider/Extender: Skipper Cliche in Treatment: 0 Constitutional supine blood pressure is within target range for patient.. pulse regular and within target range for patient.Marland Kitchen respirations regular, non-labored and within target range for patient.Marland Kitchen temperature within target range for patient.. Well-nourished and well-hydrated in no acute distress. Eyes conjunctiva clear no eyelid edema noted. pupils equal round and reactive to light and accommodation. Ears, Nose, Mouth, and Throat no gross abnormality of ear auricles or external auditory canals. normal hearing noted during conversation. mucus membranes moist. Respiratory normal breathing without difficulty. Cardiovascular 2+ dorsalis pedis/posterior tibialis pulses. no clubbing, cyanosis, significant edema, <3 sec cap refill. Psychiatric Patient is not able to cooperate in decision making regarding care. Patient has dementia. patient is confused. Notes Upon inspection patient's wound bed actually showed signs of good granulation and epithelization at this point. Fortunately there does not appear to be any signs of infection locally or systemically which is great news. I did perform just a very simple  debridement of clearway the macerated and wet callus tissue which was hanging around the edges of the wound I did not perform any extensive debridement beyond this today. There was no bleeding whatsoever and overall this was just an area that was trapping fluid at this point. Fortunately once I was done clearing this away the wound is actually pretty small in the center part of this and though there are some slough and biofilm buildup we will hold off on any debridement in regard to that until a future visit. Patient's caregiver is in agreement with that plan. Electronic Signature(s) Signed: 10/27/2021 10:51:26 AM By: Worthy Keeler PA-C Entered By: Worthy Keeler on 10/27/2021 10:51:25 [name] ([id number]) -------------------------------------------------------------------------------- Physician Orders Details Patient Name: [name] Date of Service: 10/27/2021 8:45 AM Medical Record Number: [id number] Patient Account Number: [id number] Date of Birth/Sex: 06-22-1940 (81 y.o. M) Treating RN: Carlene Coria Primary Care Provider: Steele Sizer Other Clinician: Referring Provider: Steele Sizer Treating Provider/Extender: Skipper Cliche in Treatment: 0 Verbal / Phone Orders: No Diagnosis Coding ICD-10 Coding Code Description [phone] Pressure ulcer of left heel, stage 3 G80.8 Other cerebral palsy I73.89 Other specified peripheral vascular diseases I87.393 Chronic venous hypertension (idiopathic) with other complications of bilateral lower extremity Follow-up Appointments o Return Appointment in 1 week. Bathing/ Shower/ Hygiene o May shower; gently cleanse wound with antibacterial soap, rinse and pat dry prior to dressing wounds Non-Wound Condition o Additional non-wound orders/instructions: - celan buttocks with soap and water , pat dry , apply calmoseptine ointment after each incontent episode Off-Loading o Roho cushion for wheelchair o Hospital  bed/mattress o Gel mattress overlay (Group 1) o Other: - prevalon boots to bi lat feet / legs at all times to protect heels Wound Treatment Wound #1 - Calcaneus Wound Laterality: Left Cleanser: Soap and Water 3 x Per Week/30 Days Discharge Instructions: Gently cleanse wound with antibacterial soap, rinse and pat dry prior to dressing wounds Primary Dressing: Silvercel Small 2x2 (in/in) (DME) (Generic) 3 x Per Week/30 Days Discharge Instructions: Apply Silvercel Small 2x2 (in/in) as instructed Secondary Dressing: ABD Pad 5x9 (in/in) (DME) (Generic) 3 x Per Week/30 Days Discharge Instructions: Cover with ABD pad Secured With: [place]  Medipore H Soft Cloth Surgical Tape, 2x2 (in/yd) (DME) (Generic) 3 x Per Week/30 Days Secured With: Kerlix Roll Sterile or Non-Sterile 6-ply 4.5x4 (yd/yd) (DME) (Generic) 3 x Per Week/30 Days Discharge Instructions: Apply Kerlix as directed Electronic Signature(s) Signed: 10/27/2021 4:21:19 PM By: Carlene Coria RN Signed: 10/27/2021 4:56:25 PM By: Worthy Keeler PA-C Entered By: Carlene Coria on 10/27/2021 10:30:19 [name] ([id number]) -------------------------------------------------------------------------------- Problem List Details Patient Name: [name]. Date of Service: 10/27/2021 8:45 AM Medical Record Number: [id number] Patient Account Number: [id number] Date of Birth/Sex: [birth year] (81 y.o. M) Treating RN: Carlene Coria Primary Care Provider: Steele Sizer Other Clinician: Referring Provider: Steele Sizer Treating Provider/Extender: Skipper Cliche in Treatment: 0 Active Problems ICD-10 Encounter Code Description Active Date MDM Diagnosis [phone] Pressure ulcer of left heel, stage 3 10/27/2021 No Yes G80.8 Other cerebral palsy 10/27/2021 No Yes I73.89 Other specified peripheral vascular diseases 10/27/2021 No Yes I87.393 Chronic venous hypertension (idiopathic) with other complications of 0/08/3974 No Yes bilateral  lower extremity Inactive Problems Resolved Problems Electronic Signature(s) Signed: 10/27/2021 9:47:27 AM By: Worthy Keeler PA-C Entered By: Worthy Keeler on 10/27/2021 09:47:27 Sylva, Jamal Collin ([id number]) -------------------------------------------------------------------------------- Progress Note Details Patient Name: [name]. Date of Service: 10/27/2021 8:45 AM Medical Record Number: [id number] Patient Account Number: [id number] Date of Birth/Sex: 05-17-1940 (81 y.o. M) Treating RN: Carlene Coria Primary Care Provider: Steele Sizer Other Clinician: Referring Provider: Steele Sizer Treating Provider/Extender: Skipper Cliche in Treatment: 0 Subjective Chief Complaint Information obtained from Patient Left heel pressure ulcer History of Present Illness (HPI) 10-27-2021 upon evaluation today patient presents for initial evaluation here in the clinic concerning an issue that has been present since around June 1. Subsequently the patient's caregiver from [name] tells me that he has been treated mainly by herself she has been doing what she can to try to take care of this using DuoDERM over the area. There is a lot of skin that is actually loosen up over the top of the heel surface but not all of this is necessarily covering wound in fact there is a majority of this that is not wound related as far as the skin over top is concerned. Fortunately I do not see any signs of active infection locally or systemically at this time which is great news. He does have some slough over the surface of the wound but mainly the skin around the edges the thing I am most concerned with. It is trapping a lot of fluid to be honest. Patient does have a history of chronic venous insufficiency, cerebral palsy, and stated peripheral vascular disease. With that being said I do not see any evidence right now of active infection which is great news. No fevers, chills, nausea, vomiting, or  diarrhea. Patient History Allergies No Known Allergies Social History Never smoker, Marital Status - Single, Alcohol Use - Never, Drug Use - No History, Caffeine Use - Never. Review of Systems (ROS) Eyes Complains or has symptoms of Glasses / Contacts. Integumentary (Skin) Complains or has symptoms of Wounds. Musculoskeletal Complains or has symptoms of Muscle Weakness - cerbal palsy. Objective Constitutional supine blood pressure is within target range for patient.. pulse regular and within target range for patient.Marland Kitchen respirations regular, non-labored and within target range for patient.Marland Kitchen temperature within target range for patient.. Well-nourished and well-hydrated in no acute distress. Vitals Time Taken: 9:18 AM, Height: 63 in, Source: Stated, Weight: 135 lbs, Source: Stated, BMI: 23.9, Temperature: 97.1 F, Pulse: 76 bpm,  Respiratory Rate: 16 breaths/min, Blood Pressure: 104/78 mmHg. Eyes conjunctiva clear no eyelid edema noted. pupils equal round and reactive to light and accommodation. Ears, Nose, Mouth, and Throat no gross abnormality of ear auricles or external auditory canals. normal hearing noted during conversation. mucus membranes moist. Respiratory normal breathing without difficulty. Cardiovascular 2+ dorsalis pedis/posterior tibialis pulses. no clubbing, cyanosis, significant edema, Psychiatric Patient is not able to cooperate in decision making regarding care. Patient has dementia. patient is confused. CHIPPER, KOUDELKA ([id number]) General Notes: Upon inspection patient's wound bed actually showed signs of good granulation and epithelization at this point. Fortunately there does not appear to be any signs of infection locally or systemically which is great news. I did perform just a very simple debridement of clearway the macerated and wet callus tissue which was hanging around the edges of the wound I did not perform any extensive debridement beyond this today.  There was no bleeding whatsoever and overall this was just an area that was trapping fluid at this point. Fortunately once I was done clearing this away the wound is actually pretty small in the center part of this and though there are some slough and biofilm buildup we will hold off on any debridement in regard to that until a future visit. Patient's caregiver is in agreement with that plan. Integumentary (Hair, Skin) Wound #1 status is Open. Original cause of wound was Gradually Appeared. The date acquired was: 09/22/2021. The wound is located on the Left Calcaneus. The wound measures 2cm length x 3cm width x 0.1cm depth; 4.712cm^2 area and 0.471cm^3 volume. There is Fat Layer (Subcutaneous Tissue) exposed. There is no tunneling or undermining noted. There is a medium amount of serosanguineous drainage noted. There is small (1-33%) red, pink granulation within the wound bed. There is a large (67-100%) amount of necrotic tissue within the wound bed including Adherent Slough. Assessment Active Problems ICD-10 Pressure ulcer of left heel, stage 3 Other cerebral palsy Other specified peripheral vascular diseases Chronic venous hypertension (idiopathic) with other complications of bilateral lower extremity Procedures Wound #1 Pre-procedure diagnosis of Wound #1 is a Pressure Ulcer located on the Left Calcaneus . There was a Excisional Skin/Subcutaneous Tissue Debridement with a total area of 9 sq cm performed by Tommie Sams., PA-C. With the following instrument(s): Scissors to remove Viable and Non-Viable tissue/material. Material removed includes Subcutaneous Tissue, Skin: Dermis, and Skin: Epidermis after achieving pain control using Lidocaine 4% Topical Solution. No specimens were taken. A time out was conducted at 10:05, prior to the start of the procedure. A Minimum amount of bleeding was controlled with Pressure. The procedure was tolerated well with a pain level of 0 throughout and a pain  level of 0 following the procedure. Post Debridement Measurements: 2cm length x 3cm width x 0.1cm depth; 0.471cm^3 volume. Post debridement Stage noted as Category/Stage III. Character of Wound/Ulcer Post Debridement is improved. Post procedure Diagnosis Wound #1: Same as Pre-Procedure Plan Follow-up Appointments: Return Appointment in 1 week. Bathing/ Shower/ Hygiene: May shower; gently cleanse wound with antibacterial soap, rinse and pat dry prior to dressing wounds Non-Wound Condition: Additional non-wound orders/instructions: - celan buttocks with soap and water , pat dry , apply calmoseptine ointment after each incontent episode Off-Loading: Roho cushion for wheelchair Hospital bed/mattress Gel mattress overlay (Group 1) Other: - prevalon boots to bi lat feet / legs at all times to protect heels WOUND #1: - Calcaneus Wound Laterality: Left Cleanser: Soap and Water 3 x Per Week/30 Days Discharge  Instructions: Gently cleanse wound with antibacterial soap, rinse and pat dry prior to dressing wounds Primary Dressing: Silvercel Small 2x2 (in/in) (DME) (Generic) 3 x Per Week/30 Days Discharge Instructions: Apply Silvercel Small 2x2 (in/in) as instructed Secondary Dressing: ABD Pad 5x9 (in/in) (DME) (Generic) 3 x Per Week/30 Days Discharge Instructions: Cover with ABD pad Secured With: Medipore Tape - 58M Medipore H Soft Cloth Surgical Tape, 2x2 (in/yd) (DME) (Generic) 3 x Per Week/30 Days KLARK, VANDERHOEF ([id number]) Secured With: Kerlix Roll Sterile or Non-Sterile 6-ply 4.5x4 (yd/yd) (DME) (Generic) 3 x Per Week/30 Days Discharge Instructions: Apply Kerlix as directed 1. I am going to suggest currently that we go ahead and initiate treatment with a recommendation for a silver alginate dressing to the wound bed I think this is going to do well for him. 2. Would recommend an ABD pad and roll gauze to secure in place that should provide some padding as well as catch any drainage. 3. I  am also can recommend the patient should continue to offload he does have Prevalon offloading boots this needs to be used at the facility all the time. We will see patient back for reevaluation in 1 week here in the clinic. If anything worsens or changes patient will contact our office for additional recommendations. Electronic Signature(s) Signed: 10/27/2021 10:52:13 AM By: Worthy Keeler PA-C Entered By: Worthy Keeler on 10/27/2021 10:52:13 [name] ([id number]) -------------------------------------------------------------------------------- ROS/PFSH Details Patient Name: [name] Date of Service: 10/27/2021 8:45 AM Medical Record Number: [id number] Patient Account Number: [id number] Date of Birth/Sex: [birth year] (81 y.o. M) Treating RN: Carlene Coria Primary Care Provider: Steele Sizer Other Clinician: Referring Provider: Steele Sizer Treating Provider/Extender: Skipper Cliche in Treatment: 0 Eyes Complaints and Symptoms: Positive for: Glasses / Contacts Integumentary (Skin) Complaints and Symptoms: Positive for: Wounds Musculoskeletal Complaints and Symptoms: Positive for: Muscle Weakness - cerbal palsy Immunizations Pneumococcal Vaccine: Received Pneumococcal Vaccination: No Implantable Devices None Family and Social History Never smoker; Marital Status - Single; Alcohol Use: Never; Drug Use: No History; Caffeine Use: Never Electronic Signature(s) Signed: 10/27/2021 4:21:19 PM By: Carlene Coria RN Signed: 10/27/2021 4:56:25 PM By: Worthy Keeler PA-C Entered By: Carlene Coria on 10/27/2021 09:22:27 [name] ([id number]) -------------------------------------------------------------------------------- SuperBill Details Patient Name: [name] Date of Service: 10/27/2021 Medical Record Number: [id number] Patient Account Number: [id number] Date of Birth/Sex: 06-Mar-1941 (81 y.o. M) Treating RN: Carlene Coria Primary Care Provider:  Steele Sizer Other Clinician: Referring Provider: Steele Sizer Treating Provider/Extender: Skipper Cliche in Treatment: 0 Diagnosis Coding ICD-10 Codes Code Description [phone] Pressure ulcer of left heel, stage 3 G80.8 Other cerebral palsy I73.89 Other specified peripheral vascular diseases I87.393 Chronic venous hypertension (idiopathic) with other complications of bilateral lower extremity Facility Procedures CPT4 Code: [id number] Description: [phone] - WOUND CARE VISIT-LEV 2 EST PT Modifier: Quantity: 1 CPT4 Code: [id number] Description: 11042 - DEB SUBQ TISSUE 20 SQ CM/< Modifier: Quantity: 1 CPT4 Code: Description: ICD-10 Diagnosis Description L89.623 Pressure ulcer of left heel, stage 3 Modifier: Quantity: Physician Procedures CPT4 Code Description: [id number] WC PHYS LEVEL 3 o NEW PT Modifier: 25 Quantity: 1 CPT4 Code Description: ICD-10 Diagnosis Description L89.623 Pressure ulcer of left heel, stage 3 G80.8 Other cerebral palsy I73.89 Other specified peripheral vascular diseases I87.393 Chronic venous hypertension (idiopathic) with other complications of  bila Modifier: teral lower extremit Quantity: y CPT4 Code Description: [id number] 11042 - WC PHYS SUBQ TISS 20 SQ CM Modifier: Quantity: 1 CPT4 Code Description: ICD-10 Diagnosis Description [phone]  Pressure ulcer of left heel, stage 3 Modifier: Quantity: Electronic Signature(s) Signed: 10/27/2021 11:36:01 AM By: Carlene Coria RN Signed: 10/27/2021 4:56:25 PM By: Worthy Keeler PA-C Previous Signature: 10/27/2021 10:52:27 AM Version By: Worthy Keeler PA-C Entered By: Carlene Coria on 10/27/2021 11:36:00

## 2021-10-27 NOTE — Progress Notes (Signed)
IZAIAS, KRUPKA ([id number]) Visit Report for 10/27/2021 Allergy List Details Patient Name: [name], [name]. Date of Service: 10/27/2021 8:45 AM Medical Record Number: [id number] Patient Account Number: [id number] Date of Birth/Sex: 11/17/1940 (81 y.o. M) Treating RN: Carlene Coria Primary Care Porchia Sinkler: Steele Sizer Other Clinician: Referring Rahima Fleishman: Steele Sizer Treating Shakiya Mcneary/Extender: Jeri Cos Weeks in Treatment: 0 Allergies Active Allergies No Known Allergies Allergy Notes Electronic Signature(s) Signed: 10/27/2021 4:21:19 PM By: Carlene Coria RN Entered By: Carlene Coria on 10/27/2021 09:19:50 [name] ([id number]) -------------------------------------------------------------------------------- Arrival Information Details Patient Name: [name] Date of Service: 10/27/2021 8:45 AM Medical Record Number: [id number] Patient Account Number: [id number] Date of Birth/Sex: [birth year] (81 y.o. M) Treating RN: Carlene Coria Primary Care Cimone Fahey: Steele Sizer Other Clinician: Referring Iasia Forcier: Steele Sizer Treating Kellan Raffield/Extender: Skipper Cliche in Treatment: 0 Visit Information Patient Arrived: Wheel Chair Arrival Time: 09:17 Accompanied By: caregiver Transfer Assistance: Harrel Lemon Lift Patient Identification Verified: Yes Secondary Verification Process Completed: Yes Patient Requires Transmission-Based Precautions: No Patient Has Alerts: No Electronic Signature(s) Signed: 10/27/2021 4:21:19 PM By: Carlene Coria RN Entered By: Carlene Coria on 10/27/2021 09:18:07 [name] ([id number]) -------------------------------------------------------------------------------- Clinic Level of Care Assessment Details Patient Name: [name]. Date of Service: 10/27/2021 8:45 AM Medical Record Number: [id number] Patient Account Number: [id number] Date of Birth/Sex: Sep 21, 1940 (81 y.o. M) Treating RN: Carlene Coria Primary Care Yaeli Hartung:  Steele Sizer Other Clinician: Referring Nathon Stefanski: Steele Sizer Treating Analyce Tavares/Extender: Skipper Cliche in Treatment: 0 Clinic Level of Care Assessment Items TOOL 1 Quantity Score X - Use when EandM and Procedure is performed on INITIAL visit 1 0 ASSESSMENTS - Nursing Assessment / Reassessment X - General Physical Exam (combine w/ comprehensive assessment (listed just below) when performed on new 1 20 pt. evals) X- 1 25 Comprehensive Assessment (HX, ROS, Risk Assessments, Wounds Hx, etc.) ASSESSMENTS - Wound and Skin Assessment / Reassessment '[]'$  - Dermatologic / Skin Assessment (not related to wound area) 0 ASSESSMENTS - Ostomy and/or Continence Assessment and Care '[]'$  - Incontinence Assessment and Management 0 '[]'$  - 0 Ostomy Care Assessment and Management (repouching, etc.) PROCESS - Coordination of Care X - Simple Patient / Family Education for ongoing care 1 15 '[]'$  - 0 Complex (extensive) Patient / Family Education for ongoing care '[]'$  - 0 Staff obtains Programmer, systems, Records, Test Results / Process Orders '[]'$  - 0 Staff telephones HHA, Nursing Homes / Clarify orders / etc '[]'$  - 0 Routine Transfer to another Facility (non-emergent condition) '[]'$  - 0 Routine Hospital Admission (non-emergent condition) X- 1 15 New Admissions / Biomedical engineer / Ordering NPWT, Apligraf, etc. '[]'$  - 0 Emergency Hospital Admission (emergent condition) PROCESS - Special Needs '[]'$  - Pediatric / Minor Patient Management 0 '[]'$  - 0 Isolation Patient Management '[]'$  - 0 Hearing / Language / Visual special needs '[]'$  - 0 Assessment of Community assistance (transportation, D/C planning, etc.) '[]'$  - 0 Additional assistance / Altered mentation '[]'$  - 0 Support Surface(s) Assessment (bed, cushion, seat, etc.) INTERVENTIONS - Miscellaneous '[]'$  - External ear exam 0 '[]'$  - 0 Patient Transfer (multiple staff / Civil Service fast streamer / Similar devices) '[]'$  - 0 Simple Staple / Suture removal (25 or less) '[]'$  -  0 Complex Staple / Suture removal (26 or more) '[]'$  - 0 Hypo/Hyperglycemic Management (do not check if billed separately) '[]'$  - 0 Ankle / Brachial Index (ABI) - do not check if billed separately Has the patient been seen at the hospital within the last three years: Yes Total Score: 75 Level  Of Care: New/Established - Level 2 LENOX, BINK ([id number]) Electronic Signature(s) Signed: 10/27/2021 4:21:19 PM By: Carlene Coria RN Entered By: Carlene Coria on 10/27/2021 11:35:45 [name] ([id number]) -------------------------------------------------------------------------------- Encounter Discharge Information Details Patient Name: [name] Date of Service: 10/27/2021 8:45 AM Medical Record Number: [id number] Patient Account Number: [id number] Date of Birth/Sex: 02-Sep-1940 (81 y.o. M) Treating RN: Carlene Coria Primary Care Kialee Kham: Steele Sizer Other Clinician: Referring Miamor Ayler: Steele Sizer Treating Daevon Holdren/Extender: Skipper Cliche in Treatment: 0 Encounter Discharge Information Items Post Procedure Vitals Discharge Condition: Stable Temperature (F): 97.1 Ambulatory Status: Wheelchair Pulse (bpm): 76 Discharge Destination: Home Respiratory Rate (breaths/min): 16 Transportation: Private Auto Blood Pressure (mmHg): 104/78 Accompanied By: caregiver Schedule Follow-up Appointment: Yes Clinical Summary of Care: Electronic Signature(s) Signed: 10/27/2021 11:36:58 AM By: Carlene Coria RN Entered By: Carlene Coria on 10/27/2021 11:36:58 [name] ([id number]) -------------------------------------------------------------------------------- Lower Extremity Assessment Details Patient Name: [name]. Date of Service: 10/27/2021 8:45 AM Medical Record Number: [id number] Patient Account Number: [id number] Date of Birth/Sex: [birth year] (81 y.o. M) Treating RN: Carlene Coria Primary Care Elleigh Cassetta: Steele Sizer Other Clinician: Referring  Aariyana Manz: Steele Sizer Treating Bridgett Hattabaugh/Extender: Jeri Cos Weeks in Treatment: 0 Electronic Signature(s) Signed: 10/27/2021 4:21:19 PM By: Carlene Coria RN Entered By: Carlene Coria on 10/27/2021 09:19:29 [name] ([id number]) -------------------------------------------------------------------------------- Multi Wound Chart Details Patient Name: [name]. Date of Service: 10/27/2021 8:45 AM Medical Record Number: [id number] Patient Account Number: [id number] Date of Birth/Sex: 05/01/1940 (81 y.o. M) Treating RN: Carlene Coria Primary Care Debria Broecker: Steele Sizer Other Clinician: Referring Shaquel Chavous: Steele Sizer Treating Trebor Galdamez/Extender: Skipper Cliche in Treatment: 0 Vital Signs Height(in): 63 Pulse(bpm): 26 Weight(lbs): 135 Blood Pressure(mmHg): 104/78 Body Mass Index(BMI): 23.9 Temperature(F): 97.1 Respiratory Rate(breaths/min): 16 Photos: [N/A:N/A] Wound Location: Left Calcaneus N/A N/A Wounding Event: Gradually Appeared N/A N/A Primary Etiology: Pressure Ulcer N/A N/A Date Acquired: 09/22/2021 N/A N/A Weeks of Treatment: 0 N/A N/A Wound Status: Open N/A N/A Wound Recurrence: No N/A N/A Measurements L x W x D (cm) 2x3x0.1 N/A N/A Area (cm) : 4.712 N/A N/A Volume (cm) : 0.471 N/A N/A Classification: Category/Stage III N/A N/A Exudate Amount: Medium N/A N/A Exudate Type: Serosanguineous N/A N/A Exudate Color: red, brown N/A N/A Granulation Amount: Small (1-33%) N/A N/A Granulation Quality: Red, Pink N/A N/A Necrotic Amount: Large (67-100%) N/A N/A Exposed Structures: Fat Layer (Subcutaneous Tissue): N/A N/A Yes Fascia: No Tendon: No Muscle: No Joint: No Bone: No Epithelialization: None N/A N/A Treatment Notes Electronic Signature(s) Signed: 10/27/2021 9:45:12 AM By: Carlene Coria RN Entered By: Carlene Coria on 10/27/2021 09:45:11 [name]  ([id number]) -------------------------------------------------------------------------------- Multi-Disciplinary Care Plan Details Patient Name: Lawerance Sabal A. Date of Service: 10/27/2021 8:45 AM Medical Record Number: [id number] Patient Account Number: [id number] Date of Birth/Sex: 24-Oct-1940 (81 y.o. M) Treating RN: Carlene Coria Primary Care Race Latour: Steele Sizer Other Clinician: Referring Madyson Lukach: Steele Sizer Treating Zanaiya Calabria/Extender: Skipper Cliche in Treatment: 0 Active Inactive Wound/Skin Impairment Nursing Diagnoses: Knowledge deficit related to ulceration/compromised skin integrity Goals: Patient/caregiver will verbalize understanding of skin care regimen Date Initiated: 10/27/2021 Target Resolution Date: 11/27/2021 Goal Status: Active Ulcer/skin breakdown will have a volume reduction of 30% by week 4 Date Initiated: 10/27/2021 Target Resolution Date: 11/27/2021 Goal Status: Active Ulcer/skin breakdown will have a volume reduction of 50% by week 8 Date Initiated: 10/27/2021 Target Resolution Date: 12/28/2021 Goal Status: Active Ulcer/skin breakdown will have a volume reduction of 80% by week 12 Date Initiated: 10/27/2021 Target Resolution Date: 01/27/2022 Goal Status:  Active Ulcer/skin breakdown will heal within 14 weeks Date Initiated: 10/27/2021 Target Resolution Date: 02/27/2022 Goal Status: Active Interventions: Assess patient/caregiver ability to obtain necessary supplies Assess patient/caregiver ability to perform ulcer/skin care regimen upon admission and as needed Assess ulceration(s) every visit Notes: Electronic Signature(s) Signed: 10/27/2021 9:44:06 AM By: Carlene Coria RN Entered By: Carlene Coria on 10/27/2021 09:44:06 [name] ([id number]) -------------------------------------------------------------------------------- Pain Assessment Details Patient Name: [name]. Date of Service: 10/27/2021 8:45 AM Medical Record Number:  [id number] Patient Account Number: [id number] Date of Birth/Sex: Apr 23, 1941 (81 y.o. M) Treating RN: Carlene Coria Primary Care Leshae Mcclay: Steele Sizer Other Clinician: Referring Author Hatlestad: Steele Sizer Treating Concetta Guion/Extender: Skipper Cliche in Treatment: 0 Active Problems Location of Pain Severity and Description of Pain Patient Has Paino No Site Locations Pain Management and Medication Current Pain Management: Electronic Signature(s) Signed: 10/27/2021 4:21:19 PM By: Carlene Coria RN Entered By: Carlene Coria on 10/27/2021 09:18:16 [name] ([id number]) -------------------------------------------------------------------------------- Patient/Caregiver Education Details Patient Name: [name]. Date of Service: 10/27/2021 8:45 AM Medical Record Number: [id number] Patient Account Number: [id number] Date of Birth/Gender: 1941/03/13 (81 y.o. M) Treating RN: Carlene Coria Primary Care Physician: Steele Sizer Other Clinician: Referring Physician: Steele Sizer Treating Physician/Extender: Skipper Cliche in Treatment: 0 Education Assessment Education Provided To: Patient Education Topics Provided Wound/Skin Impairment: Methods: Explain/Verbal Responses: State content correctly Electronic Signature(s) Signed: 10/27/2021 4:21:19 PM By: Carlene Coria RN Entered By: Carlene Coria on 10/27/2021 10:05:25 [name] ([id number]) -------------------------------------------------------------------------------- Wound Assessment Details Patient Name: [name]. Date of Service: 10/27/2021 8:45 AM Medical Record Number: [id number] Patient Account Number: [id number] Date of Birth/Sex: Mar 24, 1941 (81 y.o. M) Treating RN: Carlene Coria Primary Care Kalieb Freeland: Steele Sizer Other Clinician: Referring Nox Talent: Steele Sizer Treating Daud Cayer/Extender: Skipper Cliche in Treatment: 0 Wound Status Wound Number: 1 Primary Etiology: Pressure  Ulcer Wound Location: Left Calcaneus Wound Status: Open Wounding Event: Gradually Appeared Date Acquired: 09/22/2021 Weeks Of Treatment: 0 Clustered Wound: No Photos Wound Measurements Length: (cm) 2 Width: (cm) 3 Depth: (cm) 0.1 Area: (cm) 4.712 Volume: (cm) 0.471 % Reduction in Area: % Reduction in Volume: Epithelialization: None Tunneling: No Undermining: No Wound Description Classification: Category/Stage III Exudate Amount: Medium Exudate Type: Serosanguineous Exudate Color: red, brown Foul Odor After Cleansing: No Slough/Fibrino Yes Wound Bed Granulation Amount: Small (1-33%) Exposed Structure Granulation Quality: Red, Pink Fascia Exposed: No Necrotic Amount: Large (67-100%) Fat Layer (Subcutaneous Tissue) Exposed: Yes Necrotic Quality: Adherent Slough Tendon Exposed: No Muscle Exposed: No Joint Exposed: No Bone Exposed: No Treatment Notes Wound #1 (Calcaneus) Wound Laterality: Left Cleanser Soap and Water Discharge Instruction: Gently cleanse wound with antibacterial soap, rinse and pat dry prior to dressing wounds Peri-Wound Care DMETRIUS, AMBS ([id number]) Topical Primary Dressing Silvercel Small 2x2 (in/in) Discharge Instruction: Apply Silvercel Small 2x2 (in/in) as instructed Secondary Dressing ABD Pad 5x9 (in/in) Discharge Instruction: Cover with ABD pad Secured With Medipore Tape - 32M Medipore H Soft Cloth Surgical Tape, 2x2 (in/yd) Kerlix Roll Sterile or Non-Sterile 6-ply 4.5x4 (yd/yd) Discharge Instruction: Apply Kerlix as directed Compression Wrap Compression Stockings Add-Ons Electronic Signature(s) Signed: 10/27/2021 9:43:05 AM By: Carlene Coria RN Entered By: Carlene Coria on 10/27/2021 09:43:05 [name] ([id number]) -------------------------------------------------------------------------------- Vitals Details Patient Name: [name]. Date of Service: 10/27/2021 8:45 AM Medical Record Number: [id number] Patient  Account Number: [id number] Date of Birth/Sex: Sep 14, 1940 (81 y.o. M) Treating RN: Carlene Coria Primary Care Johnathan Heskett: Steele Sizer Other Clinician: Referring Ozan Maclay: Steele Sizer Treating Daymian Lill/Extender: Skipper Cliche in  Treatment: 0 Vital Signs Time Taken: 09:18 Temperature (F): 97.1 Height (in): 63 Pulse (bpm): 76 Source: Stated Respiratory Rate (breaths/min): 16 Weight (lbs): 135 Blood Pressure (mmHg): 104/78 Source: Stated Reference Range: 80 - 120 mg / dl Body Mass Index (BMI): 23.9 Electronic Signature(s) Signed: 10/27/2021 4:21:19 PM By: Carlene Coria RN Entered By: Carlene Coria on 10/27/2021 09:19:03

## 2021-10-27 NOTE — Progress Notes (Signed)
RICKE, KIMOTO ([id number]) Visit Report for 10/27/2021 Abuse Risk Screen Details Patient Name: [name], [name]. Date of Service: 10/27/2021 8:45 AM Medical Record Number: [id number] Patient Account Number: [id number] Date of Birth/Sex: 1940-11-17 (81 y.o. M) Treating RN: Carlene Coria Primary Care Lakeisa Heninger: Steele Sizer Other Clinician: Referring Travone Georg: Steele Sizer Treating Fuad Forget/Extender: Skipper Cliche in Treatment: 0 Abuse Risk Screen Items Answer ABUSE RISK SCREEN: Has anyone close to you tried to hurt or harm you recentlyo No Do you feel uncomfortable with anyone in your familyo No Has anyone forced you do things that you didnot want to doo No Electronic Signature(s) Signed: 10/27/2021 4:21:19 PM By: Carlene Coria RN Entered By: Carlene Coria on 10/27/2021 09:22:36 [name] ([id number]) -------------------------------------------------------------------------------- Activities of Daily Living Details Patient Name: [name]. Date of Service: 10/27/2021 8:45 AM Medical Record Number: [id number] Patient Account Number: [id number] Date of Birth/Sex: [birth year] (81 y.o. M) Treating RN: Carlene Coria Primary Care Donnabelle Blanchard: Steele Sizer Other Clinician: Referring Aaban Griep: Steele Sizer Treating Meshia Rau/Extender: Skipper Cliche in Treatment: 0 Activities of Daily Living Items Answer Activities of Daily Living (Please select one for each item) Drive Automobile Not Able Take Medications Not Able Use Telephone Not Able Care for Appearance Not Able Use Toilet Not Able Bath / Shower Not Able Dress Self Not Able Feed Self Need Assistance Walk Not Able Get In / Out Bed Not Able Housework Not Able Prepare Meals Not Able Handle Money Need Assistance Shop for Self Not Able Electronic Signature(s) Signed: 10/27/2021 4:21:19 PM By: Carlene Coria RN Entered By: Carlene Coria on 10/27/2021 09:23:25 [name]  ([id number]) -------------------------------------------------------------------------------- Education Screening Details Patient Name: [name]. Date of Service: 10/27/2021 8:45 AM Medical Record Number: [id number] Patient Account Number: [id number] Date of Birth/Sex: Sep 09, 1940 (81 y.o. M) Treating RN: Carlene Coria Primary Care Sarabelle Genson: Steele Sizer Other Clinician: Referring Melita Villalona: Steele Sizer Treating Jaidy Cottam/Extender: Skipper Cliche in Treatment: 0 Primary Learner Assessed: Caregiver Learning Preferences/Education Level/Primary Language Learning Preference: Explanation Highest Education Level: College or Above Preferred Language: English Cognitive Barrier Language Barrier: No Translator Needed: No Memory Deficit: No Emotional Barrier: No Cultural/Religious Beliefs Affecting Medical Care: No Physical Barrier Impaired Vision: No Impaired Hearing: No Decreased Hand dexterity: No Knowledge/Comprehension Knowledge Level: Medium Comprehension Level: High Ability to understand written instructions: High Ability to understand verbal instructions: High Motivation Anxiety Level: Anxious Cooperation: Cooperative Education Importance: Acknowledges Need Interest in Health Problems: Asks Questions Perception: Coherent Willingness to Engage in Self-Management High Activities: Readiness to Engage in Self-Management High Activities: Electronic Signature(s) Signed: 10/27/2021 4:21:19 PM By: Carlene Coria RN Entered By: Carlene Coria on 10/27/2021 09:24:43 [name] ([id number]) -------------------------------------------------------------------------------- Fall Risk Assessment Details Patient Name: [name]. Date of Service: 10/27/2021 8:45 AM Medical Record Number: [id number] Patient Account Number: [id number] Date of Birth/Sex: [birth year] (81 y.o. M) Treating RN: Carlene Coria Primary Care Areatha Kalata: Steele Sizer Other Clinician: Referring  Efrat Zuidema: Steele Sizer Treating Areebah Meinders/Extender: Skipper Cliche in Treatment: 0 Fall Risk Assessment Items Have you had 2 or more falls in the last 12 monthso 0 No Have you had any fall that resulted in injury in the last 12 monthso 0 No FALLS RISK SCREEN History of falling - immediate or within 3 months 0 No Secondary diagnosis (Do you have 2 or more medical diagnoseso) 0 No Ambulatory aid None/bed rest/wheelchair/nurse 0 No Crutches/cane/walker 0 No Furniture 0 No Intravenous therapy Access/Saline/Heparin Lock 0 No Gait/Transferring Normal/ bed rest/ wheelchair 0 No Weak (short steps with  or without shuffle, stooped but able to lift head while walking, may 0 No seek support from furniture) Impaired (short steps with shuffle, may have difficulty arising from chair, head down, impaired 0 No balance) Mental Status Oriented to own ability 0 No Electronic Signature(s) Signed: 10/27/2021 4:21:19 PM By: Carlene Coria RN Entered By: Carlene Coria on 10/27/2021 09:24:52 [name] ([id number]) -------------------------------------------------------------------------------- Foot Assessment Details Patient Name: [name]. Date of Service: 10/27/2021 8:45 AM Medical Record Number: [id number] Patient Account Number: [id number] Date of Birth/Sex: [birth year] (81 y.o. M) Treating RN: Carlene Coria Primary Care Ermias Tomeo: Steele Sizer Other Clinician: Referring Darienne Belleau: Steele Sizer Treating Jourdin Gens/Extender: Skipper Cliche in Treatment: 0 Foot Assessment Items '[x]'$  Unable to perform due to altered mental status Site Locations + = Sensation present, - = Sensation absent, C = Callus, U = Ulcer R = Redness, W = Warmth, M = Maceration, PU = Pre-ulcerative lesion F = Fissure, S = Swelling, D = Dryness Assessment Right: Left: Other Deformity: No No Prior Foot Ulcer: No No Prior Amputation: No No Charcot Joint: No No Ambulatory Status:  Non-ambulatory Assistance Device: Wheelchair Gait: Administrator, arts) Signed: 10/27/2021 4:21:19 PM By: Carlene Coria RN Entered By: Carlene Coria on 10/27/2021 09:25:45 [name] ([id number]) -------------------------------------------------------------------------------- Nutrition Risk Screening Details Patient Name: [name]. Date of Service: 10/27/2021 8:45 AM Medical Record Number: [id number] Patient Account Number: [id number] Date of Birth/Sex: December 07, 1940 (81 y.o. M) Treating RN: Carlene Coria Primary Care Deavion Strider: Steele Sizer Other Clinician: Referring Teagyn Fishel: Steele Sizer Treating Kamira Mellette/Extender: Skipper Cliche in Treatment: 0 Height (in): 63 Weight (lbs): 135 Body Mass Index (BMI): 23.9 Nutrition Risk Screening Items Score Screening NUTRITION RISK SCREEN: I have an illness or condition that made me change the kind and/or amount of food I eat 0 No I eat fewer than two meals per day 0 No I eat few fruits and vegetables, or milk products 0 No I have three or more drinks of beer, liquor or wine almost every day 0 No I have tooth or mouth problems that make it hard for me to eat 0 No I don't always have enough money to buy the food I need 0 No I eat alone most of the time 0 No I take three or more different prescribed or over-the-counter drugs a day 1 Yes Without wanting to, I have lost or gained 10 pounds in the last six months 0 No I am not always physically able to shop, cook and/or feed myself 2 Yes Nutrition Protocols Good Risk Protocol Moderate Risk Protocol 0 Provide education on nutrition High Risk Proctocol Risk Level: Moderate Risk Score: 3 Electronic Signature(s) Signed: 10/27/2021 4:21:19 PM By: Carlene Coria RN Entered By: Carlene Coria on 10/27/2021 09:25:14

## 2021-10-31 ENCOUNTER — Other Ambulatory Visit: Payer: Self-pay | Admitting: Family Medicine

## 2021-11-10 ENCOUNTER — Encounter: Payer: Medicare Other | Admitting: Physician Assistant

## 2021-11-10 DIAGNOSIS — L89623 Pressure ulcer of left heel, stage 3: Secondary | ICD-10-CM | POA: Diagnosis not present

## 2021-11-10 NOTE — Progress Notes (Addendum)
[name] ([id number]) Visit Report for 11/10/2021 Arrival Information Details Patient Name: [name], [name]. Date of Service: 11/10/2021 12:30 PM Medical Record Number: [id number] Patient Account Number: [id number] Date of Birth/Sex: [birth year] (81 y.o. M) Treating RN: Carlene Coria Primary Care [name]: Steele Sizer Other Clinician: Referring Rahshawn Remo: Steele Sizer Treating Raziah Funnell/Extender: Skipper Cliche in Treatment: 2 Visit Information History Since Last Visit All ordered tests and consults were completed: No Patient Arrived: Wheel Chair Added or deleted any medications: No Arrival Time: 12:35 Any new allergies or adverse reactions: No Accompanied By: caregiver Had a fall or experienced change in No Transfer Assistance: None activities of daily living that may affect Patient Identification Verified: Yes risk of falls: Secondary Verification Process Completed: Yes Signs or symptoms of abuse/neglect since last visito No Patient Requires Transmission-Based Precautions: No Hospitalized since last visit: No Patient Has Alerts: No Implantable device outside of the clinic excluding No cellular tissue based products placed in the center since last visit: Has Dressing in Place as Prescribed: Yes Pain Present Now: No Electronic Signature(s) Signed: 11/10/2021 12:46:24 PM By: Carlene Coria RN Entered By: Carlene Coria on 11/10/2021 12:46:24 [name] ([id number]) -------------------------------------------------------------------------------- Clinic Level of Care Assessment Details Patient Name: [name]. Date of Service: 11/10/2021 12:30 PM Medical Record Number: [id number] Patient Account Number: [id number] Date of Birth/Sex: 1941-02-23 (81 y.o. M) Treating RN: Carlene Coria Primary Care Markie Heffernan: Steele Sizer Other Clinician: Referring Thaine Garriga: Steele Sizer Treating Korin Setzler/Extender: Skipper Cliche in Treatment: 2 Clinic Level of  Care Assessment Items TOOL 1 Quantity Score '[]'$  - Use when EandM and Procedure is performed on INITIAL visit 0 ASSESSMENTS - Nursing Assessment / Reassessment '[]'$  - General Physical Exam (combine w/ comprehensive assessment (listed just below) when performed on new 0 pt. evals) '[]'$  - 0 Comprehensive Assessment (HX, ROS, Risk Assessments, Wounds Hx, etc.) ASSESSMENTS - Wound and Skin Assessment / Reassessment '[]'$  - Dermatologic / Skin Assessment (not related to wound area) 0 ASSESSMENTS - Ostomy and/or Continence Assessment and Care '[]'$  - Incontinence Assessment and Management 0 '[]'$  - 0 Ostomy Care Assessment and Management (repouching, etc.) PROCESS - Coordination of Care '[]'$  - Simple Patient / Family Education for ongoing care 0 '[]'$  - 0 Complex (extensive) Patient / Family Education for ongoing care '[]'$  - 0 Staff obtains Programmer, systems, Records, Test Results / Process Orders '[]'$  - 0 Staff telephones HHA, Nursing Homes / Clarify orders / etc '[]'$  - 0 Routine Transfer to another Facility (non-emergent condition) '[]'$  - 0 Routine Hospital Admission (non-emergent condition) '[]'$  - 0 New Admissions / Biomedical engineer / Ordering NPWT, Apligraf, etc. '[]'$  - 0 Emergency Hospital Admission (emergent condition) PROCESS - Special Needs '[]'$  - Pediatric / Minor Patient Management 0 '[]'$  - 0 Isolation Patient Management '[]'$  - 0 Hearing / Language / Visual special needs '[]'$  - 0 Assessment of Community assistance (transportation, D/C planning, etc.) '[]'$  - 0 Additional assistance / Altered mentation '[]'$  - 0 Support Surface(s) Assessment (bed, cushion, seat, etc.) INTERVENTIONS - Miscellaneous '[]'$  - External ear exam 0 '[]'$  - 0 Patient Transfer (multiple staff / Civil Service fast streamer / Similar devices) '[]'$  - 0 Simple Staple / Suture removal (25 or less) '[]'$  - 0 Complex Staple / Suture removal (26 or more) '[]'$  - 0 Hypo/Hyperglycemic Management (do not check if billed separately) '[]'$  - 0 Ankle / Brachial Index (ABI) - do  not check if billed separately Has the patient been seen at the hospital within the last three years: Yes Total Score: 0  Level Of Care: ____ [name] ([id number]) Electronic Signature(s) Signed: 11/15/2021 3:54:37 PM By: Carlene Coria RN Entered By: Carlene Coria on 11/10/2021 13:20:11 [name] ([id number]) -------------------------------------------------------------------------------- Encounter Discharge Information Details Patient Name: [name], Kevin A. Date of Service: 11/10/2021 12:30 PM Medical Record Number: [id number] Patient Account Number: [id number] Date of Birth/Sex: 12/07/1940 (81 y.o. M) Treating RN: Carlene Coria Primary Care Keeana Pieratt: Steele Sizer Other Clinician: Referring Ryota Treece: Steele Sizer Treating Zamar Odwyer/Extender: Skipper Cliche in Treatment: 2 Encounter Discharge Information Items Post Procedure Vitals Discharge Condition: Stable Temperature (F): 97.5 Ambulatory Status: Wheelchair Pulse (bpm): 59 Discharge Destination: Home Respiratory Rate (breaths/min): 16 Transportation: Private Auto Blood Pressure (mmHg): 98/61 Accompanied By: caregiver Schedule Follow-up Appointment: Yes Clinical Summary of Care: Electronic Signature(s) Signed: 11/10/2021 1:21:19 PM By: Carlene Coria RN Entered By: Carlene Coria on 11/10/2021 13:21:19 [name] ([id number]) -------------------------------------------------------------------------------- Lower Extremity Assessment Details Patient Name: [name]. Date of Service: 11/10/2021 12:30 PM Medical Record Number: [id number] Patient Account Number: [id number] Date of Birth/Sex: 1940/11/01 (81 y.o. M) Treating RN: Carlene Coria Primary Care Kennisha Qin: Steele Sizer Other Clinician: Referring Ebony Yorio: Steele Sizer Treating Dakotah Orrego/Extender: Jeri Cos Weeks in Treatment: 2 Edema Assessment Assessed: [Left: No] [Right: No] Edema: [Left: N] [Right: o] Vascular  Assessment Pulses: Dorsalis Pedis Palpable: [Left:Yes] Electronic Signature(s) Signed: 11/10/2021 12:47:39 PM By: Carlene Coria RN Entered By: Carlene Coria on 11/10/2021 12:47:39 [name] ([id number]) -------------------------------------------------------------------------------- Multi Wound Chart Details Patient Name: [name]. Date of Service: 11/10/2021 12:30 PM Medical Record Number: [id number] Patient Account Number: [id number] Date of Birth/Sex: 04-07-1941 (81 y.o. M) Treating RN: Carlene Coria Primary Care Soniya Ashraf: Steele Sizer Other Clinician: Referring Idus Rathke: Steele Sizer Treating Danaka Llera/Extender: Skipper Cliche in Treatment: 2 Vital Signs Height(in): 63 Pulse(bpm): 68 Weight(lbs): 135 Blood Pressure(mmHg): 98/68 Body Mass Index(BMI): 23.9 Temperature(F): 97.7 Respiratory Rate(breaths/min): 18 Photos: [N/A:N/A] Wound Location: Left Calcaneus N/A N/A Wounding Event: Gradually Appeared N/A N/A Primary Etiology: Pressure Ulcer N/A N/A Date Acquired: 09/22/2021 N/A N/A Weeks of Treatment: 2 N/A N/A Wound Status: Open N/A N/A Wound Recurrence: No N/A N/A Measurements L x W x D (cm) 1.5x2.5x0.1 N/A N/A Area (cm) : 2.945 N/A N/A Volume (cm) : 0.295 N/A N/A % Reduction in Area: 37.50% N/A N/A % Reduction in Volume: 37.40% N/A N/A Classification: Category/Stage III N/A N/A Exudate Amount: Medium N/A N/A Exudate Type: Serosanguineous N/A N/A Exudate Color: red, brown N/A N/A Granulation Amount: Small (1-33%) N/A N/A Granulation Quality: Red, Pink N/A N/A Necrotic Amount: Large (67-100%) N/A N/A Necrotic Tissue: Eschar, Adherent Slough N/A N/A Exposed Structures: Fat Layer (Subcutaneous Tissue): N/A N/A Yes Fascia: No Tendon: No Muscle: No Joint: No Bone: No Epithelialization: None N/A N/A Treatment Notes Electronic Signature(s) Signed: 11/10/2021 12:47:55 PM By: Carlene Coria RN Entered By: Carlene Coria on 11/10/2021  12:47:55 [name] ([id number]) -------------------------------------------------------------------------------- Multi-Disciplinary Care Plan Details Patient Name: Lawerance Sabal A. Date of Service: 11/10/2021 12:30 PM Medical Record Number: [id number] Patient Account Number: [id number] Date of Birth/Sex: 1940-12-13 (81 y.o. M) Treating RN: Carlene Coria Primary Care Delaila Nand: Steele Sizer Other Clinician: Referring Bettylee Feig: Steele Sizer Treating Shlok Raz/Extender: Skipper Cliche in Treatment: 2 Active Inactive Wound/Skin Impairment Nursing Diagnoses: Knowledge deficit related to ulceration/compromised skin integrity Goals: Patient/caregiver will verbalize understanding of skin care regimen Date Initiated: 10/27/2021 Target Resolution Date: 11/27/2021 Goal Status: Active Ulcer/skin breakdown will have a volume reduction of 30% by week 4 Date Initiated: 10/27/2021 Target Resolution Date: 11/27/2021 Goal Status: Active Ulcer/skin breakdown will have a  volume reduction of 50% by week 8 Date Initiated: 10/27/2021 Target Resolution Date: 12/28/2021 Goal Status: Active Ulcer/skin breakdown will have a volume reduction of 80% by week 12 Date Initiated: 10/27/2021 Target Resolution Date: 01/27/2022 Goal Status: Active Ulcer/skin breakdown will heal within 14 weeks Date Initiated: 10/27/2021 Target Resolution Date: 02/27/2022 Goal Status: Active Interventions: Assess patient/caregiver ability to obtain necessary supplies Assess patient/caregiver ability to perform ulcer/skin care regimen upon admission and as needed Assess ulceration(s) every visit Notes: Electronic Signature(s) Signed: 11/10/2021 12:47:45 PM By: Carlene Coria RN Entered By: Carlene Coria on 11/10/2021 12:47:45 [name] ([id number]) -------------------------------------------------------------------------------- Pain Assessment Details Patient Name: [name]. Date of Service: 11/10/2021 12:30  PM Medical Record Number: [id number] Patient Account Number: [id number] Date of Birth/Sex: 10-Apr-1941 (81 y.o. M) Treating RN: Carlene Coria Primary Care Grainne Knights: Steele Sizer Other Clinician: Referring Miasia Crabtree: Steele Sizer Treating Alga Southall/Extender: Skipper Cliche in Treatment: 2 Active Problems Location of Pain Severity and Description of Pain Patient Has Paino No Site Locations Pain Management and Medication Current Pain Management: Electronic Signature(s) Signed: 11/10/2021 12:46:55 PM By: Carlene Coria RN Entered By: Carlene Coria on 11/10/2021 12:46:54 [name] ([id number]) -------------------------------------------------------------------------------- Patient/Caregiver Education Details Patient Name: [name]. Date of Service: 11/10/2021 12:30 PM Medical Record Number: [id number] Patient Account Number: [id number] Date of Birth/Gender: 07-Jan-1941 (81 y.o. M) Treating RN: Carlene Coria Primary Care Physician: Steele Sizer Other Clinician: Referring Physician: Steele Sizer Treating Physician/Extender: Skipper Cliche in Treatment: 2 Education Assessment Education Provided To: Patient Education Topics Provided Wound/Skin Impairment: Methods: Explain/Verbal Responses: State content correctly Electronic Signature(s) Signed: 11/15/2021 3:54:37 PM By: Carlene Coria RN Entered By: Carlene Coria on 11/10/2021 13:20:28 [name] ([id number]) -------------------------------------------------------------------------------- Wound Assessment Details Patient Name: [name]. Date of Service: 11/10/2021 12:30 PM Medical Record Number: [id number] Patient Account Number: [id number] Date of Birth/Sex: Jul 12, 1940 (81 y.o. M) Treating RN: Carlene Coria Primary Care Terrill Alperin: Steele Sizer Other Clinician: Referring Renold Kozar: Steele Sizer Treating Parneet Glantz/Extender: Skipper Cliche in Treatment: 2 Wound Status Wound Number:  1 Primary Etiology: Pressure Ulcer Wound Location: Left Calcaneus Wound Status: Open Wounding Event: Gradually Appeared Date Acquired: 09/22/2021 Weeks Of Treatment: 2 Clustered Wound: No Photos Wound Measurements Length: (cm) 1.5 Width: (cm) 2.5 Depth: (cm) 0.1 Area: (cm) 2.945 Volume: (cm) 0.295 % Reduction in Area: 37.5% % Reduction in Volume: 37.4% Epithelialization: None Tunneling: No Undermining: No Wound Description Classification: Category/Stage III Exudate Amount: Medium Exudate Type: Serosanguineous Exudate Color: red, brown Foul Odor After Cleansing: No Slough/Fibrino Yes Wound Bed Granulation Amount: Small (1-33%) Exposed Structure Granulation Quality: Red, Pink Fascia Exposed: No Necrotic Amount: Large (67-100%) Fat Layer (Subcutaneous Tissue) Exposed: Yes Necrotic Quality: Eschar, Adherent Slough Tendon Exposed: No Muscle Exposed: No Joint Exposed: No Bone Exposed: No Treatment Notes Wound #1 (Calcaneus) Wound Laterality: Left Cleanser Soap and Water Discharge Instruction: Gently cleanse wound with antibacterial soap, rinse and pat dry prior to dressing wounds Peri-Wound Care DERON, POOLE ([id number]) Topical Primary Dressing Silvercel Small 2x2 (in/in) Discharge Instruction: Apply Silvercel Small 2x2 (in/in) as instructed Secondary Dressing ABD Pad 5x9 (in/in) Discharge Instruction: Cover with ABD pad Secured With Medipore Tape - 74M Medipore H Soft Cloth Surgical Tape, 2x2 (in/yd) Kerlix Roll Sterile or Non-Sterile 6-ply 4.5x4 (yd/yd) Discharge Instruction: Apply Kerlix as directed Compression Wrap Compression Stockings Add-Ons Electronic Signature(s) Signed: 11/10/2021 12:47:24 PM By: Carlene Coria RN Entered By: Carlene Coria on 11/10/2021 12:47:24 [name] ([id number]) -------------------------------------------------------------------------------- Vitals Details Patient Name: [name]. Date of  Service:  11/10/2021 12:30 PM Medical Record Number: [id number] Patient Account Number: [id number] Date of Birth/Sex: [birth year] (81 y.o. M) Treating RN: Carlene Coria Primary Care Ki Corbo: Steele Sizer Other Clinician: Referring Bryar Dahms: Steele Sizer Treating Naiyana Barbian/Extender: Skipper Cliche in Treatment: 2 Vital Signs Time Taken: 12:45 Temperature (F): 97.7 Height (in): 63 Pulse (bpm): 68 Weight (lbs): 135 Respiratory Rate (breaths/min): 18 Body Mass Index (BMI): 23.9 Blood Pressure (mmHg): 98/68 Reference Range: 80 - 120 mg / dl Electronic Signature(s) Signed: 11/10/2021 12:46:48 PM By: Carlene Coria RN Entered By: Carlene Coria on 11/10/2021 12:46:48

## 2021-11-10 NOTE — Progress Notes (Addendum)
[name] ([id number]) Visit Report for 11/10/2021 Chief Complaint Document Details Patient Name: [name], [name]. Date of Service: 11/10/2021 12:30 PM Medical Record Number: [id number] Patient Account Number: [id number] Date of Birth/Sex: May 21, 1940 (81 y.o. M) Treating RN: Carlene Coria Primary Care Provider: Steele Sizer Other Clinician: Referring Provider: Steele Sizer Treating Provider/Extender: Skipper Cliche in Treatment: 2 Information Obtained from: Patient Chief Complaint Left heel pressure ulcer Electronic Signature(s) Signed: 11/10/2021 12:48:49 PM By: Worthy Keeler PA-C Entered By: Worthy Keeler on 11/10/2021 12:48:49 [name] ([id number]) -------------------------------------------------------------------------------- Debridement Details Patient Name: [name] Date of Service: 11/10/2021 12:30 PM Medical Record Number: [id number] Patient Account Number: [id number] Date of Birth/Sex: 1940-12-20 (81 y.o. M) Treating RN: Carlene Coria Primary Care Provider: Steele Sizer Other Clinician: Referring Provider: Steele Sizer Treating Provider/Extender: Skipper Cliche in Treatment: 2 Debridement Performed for Wound #1 Left Calcaneus Assessment: Performed By: Physician Tommie Sams., PA-C Debridement Type: Debridement Level of Consciousness (Pre- Awake and Alert procedure): Pre-procedure Verification/Time Out Yes - 13:00 Taken: Start Time: 13:00 Pain Control: Lidocaine 4% Topical Solution Total Area Debrided (L x W): 1.5 (cm) x 2.5 (cm) = 3.75 (cm) Tissue and other material Viable, Non-Viable, Eschar, Subcutaneous, Skin: Dermis , Skin: Epidermis debrided: Level: Skin/Subcutaneous Tissue Debridement Description: Excisional Instrument: Forceps, Scissors Bleeding: Minimum Hemostasis Achieved: Pressure End Time: 13:04 Procedural Pain: 0 Post Procedural Pain: 0 Response to Treatment: Procedure was tolerated well Level  of Consciousness (Post- Awake and Alert procedure): Post Debridement Measurements of Total Wound Length: (cm) 1.5 Stage: Category/Stage III Width: (cm) 2.5 Depth: (cm) 0.1 Volume: (cm) 0.295 Character of Wound/Ulcer Post Debridement: Improved Post Procedure Diagnosis Same as Pre-procedure Electronic Signature(s) Signed: 11/10/2021 1:19:47 PM By: Carlene Coria RN Signed: 11/10/2021 5:07:46 PM By: Worthy Keeler PA-C Previous Signature: 11/10/2021 1:18:53 PM Version By: Worthy Keeler PA-C Entered By: Carlene Coria on 11/10/2021 13:19:46 [name] ([id number]) -------------------------------------------------------------------------------- HPI Details Patient Name: [name]. Date of Service: 11/10/2021 12:30 PM Medical Record Number: [id number] Patient Account Number: [id number] Date of Birth/Sex: 1940/08/12 (81 y.o. M) Treating RN: Carlene Coria Primary Care Provider: Steele Sizer Other Clinician: Referring Provider: Steele Sizer Treating Provider/Extender: Skipper Cliche in Treatment: 2 History of Present Illness HPI Description: 10-27-2021 upon evaluation today patient presents for initial evaluation here in the clinic concerning an issue that has been present since around June 1. Subsequently the patient's caregiver from [name] tells me that he has been treated mainly by herself she has been doing what she can to try to take care of this using DuoDERM over the area. There is a lot of skin that is actually loosen up over the top of the heel surface but not all of this is necessarily covering wound in fact there is a majority of this that is not wound related as far as the skin over top is concerned. Fortunately I do not see any signs of active infection locally or systemically at this time which is great news. He does have some slough over the surface of the wound but mainly the skin around the edges the thing I am most concerned with. It is trapping a lot  of fluid to be honest. Patient does have a history of chronic venous insufficiency, cerebral palsy, and stated peripheral vascular disease. With that being said I do not see any evidence right now of active infection which is great news. No fevers, chills, nausea, vomiting, or diarrhea. 11-10-2021 upon evaluation today patient  appears to be doing well currently in regard to his wound on the heel. Fortunately there does not appear to be any signs of active infection locally or systemically at this time which is great news. Fortunately I do not see any evidence of infection which is also excellent. No fevers, chills, nausea, vomiting, or diarrhea. Electronic Signature(s) Signed: 11/10/2021 1:15:45 PM By: Worthy Keeler PA-C Entered By: Worthy Keeler on 11/10/2021 13:15:45 [name] ([id number]) -------------------------------------------------------------------------------- Physical Exam Details Patient Name: [name] Date of Service: 11/10/2021 12:30 PM Medical Record Number: [id number] Patient Account Number: [id number] Date of Birth/Sex: 05-24-1940 (81 y.o. M) Treating RN: Carlene Coria Primary Care Provider: Steele Sizer Other Clinician: Referring Provider: Steele Sizer Treating Provider/Extender: Skipper Cliche in Treatment: 2 Constitutional Well-nourished and well-hydrated in no acute distress. Respiratory normal breathing without difficulty. Psychiatric this patient is able to make decisions and demonstrates good insight into disease process. Alert and Oriented x 3. pleasant and cooperative. Notes Upon inspection patient's wound bed actually did require some sharp debridement clearway some of the necrotic debris and he tolerated this debridement today without complication. Postdebridement the wound bed appears to be doing much better which is great news and overall I am extremely pleased with where things stand at this point. Electronic  Signature(s) Signed: 11/10/2021 1:16:02 PM By: Worthy Keeler PA-C Entered By: Worthy Keeler on 11/10/2021 13:16:02 [name] ([id number]) -------------------------------------------------------------------------------- Physician Orders Details Patient Name: [name] Date of Service: 11/10/2021 12:30 PM Medical Record Number: [id number] Patient Account Number: [id number] Date of Birth/Sex: [birth year] (81 y.o. M) Treating RN: Carlene Coria Primary Care Provider: Steele Sizer Other Clinician: Referring Provider: Steele Sizer Treating Provider/Extender: Skipper Cliche in Treatment: 2 Verbal / Phone Orders: No Diagnosis Coding ICD-10 Coding Code Description [phone] Pressure ulcer of left heel, stage 3 G80.8 Other cerebral palsy I73.89 Other specified peripheral vascular diseases I87.393 Chronic venous hypertension (idiopathic) with other complications of bilateral lower extremity Follow-up Appointments o Return Appointment in 1 week. Bathing/ Shower/ Hygiene o May shower; gently cleanse wound with antibacterial soap, rinse and pat dry prior to dressing wounds Non-Wound Condition o Additional non-wound orders/instructions: - clean buttocks with soap and water , pat dry , apply calmoseptine ointment after each incontent episode Off-Loading o Roho cushion for wheelchair o Hospital bed/mattress o Gel mattress overlay (Group 1) o Other: - prevalon boots to bi lat feet / legs at all times to protect heels Wound Treatment Wound #1 - Calcaneus Wound Laterality: Left Cleanser: Soap and Water 3 x Per Week/30 Days Discharge Instructions: Gently cleanse wound with antibacterial soap, rinse and pat dry prior to dressing wounds Primary Dressing: Silvercel Small 2x2 (in/in) (Generic) 3 x Per Week/30 Days Discharge Instructions: Apply Silvercel Small 2x2 (in/in) as instructed Secondary Dressing: ABD Pad 5x9 (in/in) (Generic) 3 x Per Week/30  Days Discharge Instructions: Cover with ABD pad Secured With: Medipore Tape - 4M Medipore H Soft Cloth Surgical Tape, 2x2 (in/yd) (Generic) 3 x Per Week/30 Days Secured With: [employer] Sterile or Non-Sterile 6-ply 4.5x4 (yd/yd) (Generic) 3 x Per Week/30 Days Discharge Instructions: Apply Kerlix as directed Electronic Signature(s) Signed: 11/10/2021 5:07:46 PM By: Worthy Keeler PA-C Signed: 11/15/2021 3:54:37 PM By: Carlene Coria RN Entered By: Carlene Coria on 11/10/2021 13:08:16 [name] ([id number]) -------------------------------------------------------------------------------- Problem List Details Patient Name: [name]. Date of Service: 11/10/2021 12:30 PM Medical Record Number: [id number] Patient Account Number: [id number] Date of Birth/Sex: 02-15-1941 (81 y.o. M) Treating  RN: Carlene Coria Primary Care Provider: Steele Sizer Other Clinician: Referring Provider: Steele Sizer Treating Provider/Extender: Skipper Cliche in Treatment: 2 Active Problems ICD-10 Encounter Code Description Active Date MDM Diagnosis [phone] Pressure ulcer of left heel, stage 3 10/27/2021 No Yes G80.8 Other cerebral palsy 10/27/2021 No Yes I73.89 Other specified peripheral vascular diseases 10/27/2021 No Yes I87.393 Chronic venous hypertension (idiopathic) with other complications of 05/25/3084 No Yes bilateral lower extremity Inactive Problems Resolved Problems Electronic Signature(s) Signed: 11/10/2021 12:48:46 PM By: Worthy Keeler PA-C Entered By: Worthy Keeler on 11/10/2021 12:48:46 [name] ([id number]) -------------------------------------------------------------------------------- Progress Note Details Patient Name: [name]. Date of Service: 11/10/2021 12:30 PM Medical Record Number: [id number] Patient Account Number: [id number] Date of Birth/Sex: 11-Mar-1941 (81 y.o. M) Treating RN: Carlene Coria Primary Care Provider: Steele Sizer Other  Clinician: Referring Provider: Steele Sizer Treating Provider/Extender: Skipper Cliche in Treatment: 2 Subjective Chief Complaint Information obtained from Patient Left heel pressure ulcer History of Present Illness (HPI) 10-27-2021 upon evaluation today patient presents for initial evaluation here in the clinic concerning an issue that has been present since around June 1. Subsequently the patient's caregiver from [name] tells me that he has been treated mainly by herself she has been doing what she can to try to take care of this using DuoDERM over the area. There is a lot of skin that is actually loosen up over the top of the heel surface but not all of this is necessarily covering wound in fact there is a majority of this that is not wound related as far as the skin over top is concerned. Fortunately I do not see any signs of active infection locally or systemically at this time which is great news. He does have some slough over the surface of the wound but mainly the skin around the edges the thing I am most concerned with. It is trapping a lot of fluid to be honest. Patient does have a history of chronic venous insufficiency, cerebral palsy, and stated peripheral vascular disease. With that being said I do not see any evidence right now of active infection which is great news. No fevers, chills, nausea, vomiting, or diarrhea. 11-10-2021 upon evaluation today patient appears to be doing well currently in regard to his wound on the heel. Fortunately there does not appear to be any signs of active infection locally or systemically at this time which is great news. Fortunately I do not see any evidence of infection which is also excellent. No fevers, chills, nausea, vomiting, or diarrhea. Objective Constitutional Well-nourished and well-hydrated in no acute distress. Vitals Time Taken: 12:45 PM, Height: 63 in, Weight: 135 lbs, BMI: 23.9, Temperature: 97.7 F, Pulse: 68 bpm,  Respiratory Rate: 18 breaths/min, Blood Pressure: 98/68 mmHg. Respiratory normal breathing without difficulty. Psychiatric this patient is able to make decisions and demonstrates good insight into disease process. Alert and Oriented x 3. pleasant and cooperative. General Notes: Upon inspection patient's wound bed actually did require some sharp debridement clearway some of the necrotic debris and he tolerated this debridement today without complication. Postdebridement the wound bed appears to be doing much better which is great news and overall I am extremely pleased with where things stand at this point. Integumentary (Hair, Skin) Wound #1 status is Open. Original cause of wound was Gradually Appeared. The date acquired was: 09/22/2021. The wound has been in treatment 2 weeks. The wound is located on the Left Calcaneus. The wound measures 1.5cm length x 2.5cm  width x 0.1cm depth; 2.945cm^2 area and 0.295cm^3 volume. There is Fat Layer (Subcutaneous Tissue) exposed. There is no tunneling or undermining noted. There is a medium amount of serosanguineous drainage noted. There is small (1-33%) red, pink granulation within the wound bed. There is a large (67-100%) amount of necrotic tissue within the wound bed including Eschar and Adherent Slough. Assessment Active Problems ICD-10 Pressure ulcer of left heel, stage 3 [name], [name] ([id number]) Other cerebral palsy Other specified peripheral vascular diseases Chronic venous hypertension (idiopathic) with other complications of bilateral lower extremity Procedures Wound #1 Pre-procedure diagnosis of Wound #1 is a Pressure Ulcer located on the Left Calcaneus . There was a Excisional Skin/Subcutaneous Tissue Debridement with a total area of 3.75 sq cm performed by Tommie Sams., PA-C. With the following instrument(s): Forceps, and Scissors to remove Viable tissue/material. Material removed includes Eschar and Subcutaneous Tissue and after  achieving pain control using Lidocaine 4% Topical Solution. No specimens were taken. A time out was conducted at 13:10, prior to the start of the procedure. A Minimum amount of bleeding was controlled with Pressure. The procedure was tolerated well. Post Debridement Measurements: 1.5cm length x 2.5cm width x 0.2cm depth; 0.589cm^3 volume. Post debridement Stage noted as Category/Stage III. Character of Wound/Ulcer Post Debridement is improved. Post procedure Diagnosis Wound #1: Same as Pre-Procedure Plan Follow-up Appointments: Return Appointment in 1 week. Bathing/ Shower/ Hygiene: May shower; gently cleanse wound with antibacterial soap, rinse and pat dry prior to dressing wounds Non-Wound Condition: Additional non-wound orders/instructions: - clean buttocks with soap and water , pat dry , apply calmoseptine ointment after each incontent episode Off-Loading: Roho cushion for wheelchair Hospital bed/mattress Gel mattress overlay (Group 1) Other: - prevalon boots to bi lat feet / legs at all times to protect heels WOUND #1: - Calcaneus Wound Laterality: Left Cleanser: Soap and Water 3 x Per Week/30 Days Discharge Instructions: Gently cleanse wound with antibacterial soap, rinse and pat dry prior to dressing wounds Primary Dressing: Silvercel Small 2x2 (in/in) (Generic) 3 x Per Week/30 Days Discharge Instructions: Apply Silvercel Small 2x2 (in/in) as instructed Secondary Dressing: ABD Pad 5x9 (in/in) (Generic) 3 x Per Week/30 Days Discharge Instructions: Cover with ABD pad Secured With: Medipore Tape - 67M Medipore H Soft Cloth Surgical Tape, 2x2 (in/yd) (Generic) 3 x Per Week/30 Days Secured With: [employer] Sterile or Non-Sterile 6-ply 4.5x4 (yd/yd) (Generic) 3 x Per Week/30 Days Discharge Instructions: Apply Kerlix as directed 1. I am going to suggest that we go ahead and continue with the wound care measures as before and the patient is in agreement with plan. This includes the use  of the silver alginate dressing which I do believe is doing very well. 2. I am also can recommend that we continue with the ABD pad to cover. 3. We will use roll gauze to secure in place. 4. The patient should be wearing the Prevalon offloading boots on a regular basis pretty much at all times. We will see patient back for reevaluation in 1 week here in the clinic. If anything worsens or changes patient will contact our office for additional recommendations. Electronic Signature(s) Signed: 11/10/2021 1:19:23 PM By: Worthy Keeler PA-C Previous Signature: 11/10/2021 1:16:31 PM Version By: Worthy Keeler PA-C Entered By: Worthy Keeler on 11/10/2021 13:19:23 [name] ([id number]) -------------------------------------------------------------------------------- SuperBill Details Patient Name: [name] Date of Service: 11/10/2021 Medical Record Number: [id number] Patient Account Number: [id number] Date of Birth/Sex: Aug 23, 1940 (81 y.o. M) Treating RN:  Carlene Coria Primary Care Provider: Steele Sizer Other Clinician: Referring Provider: Steele Sizer Treating Provider/Extender: Skipper Cliche in Treatment: 2 Diagnosis Coding ICD-10 Codes Code Description [phone] Pressure ulcer of left heel, stage 3 G80.8 Other cerebral palsy I73.89 Other specified peripheral vascular diseases I87.393 Chronic venous hypertension (idiopathic) with other complications of bilateral lower extremity Facility Procedures CPT4 Code: [id number] Description: 47308 - DEB SUBQ TISSUE 20 SQ CM/< Modifier: Quantity: 1 CPT4 Code: Description: ICD-10 Diagnosis Description L89.623 Pressure ulcer of left heel, stage 3 Modifier: Quantity: Physician Procedures CPT4 Code: [id number] Description: 05259 - WC PHYS SUBQ TISS 20 SQ CM Modifier: Quantity: 1 CPT4 Code: Description: ICD-10 Diagnosis Description L89.623 Pressure ulcer of left heel, stage 3 Modifier: Quantity: Electronic  Signature(s) Signed: 11/10/2021 1:19:50 PM By: Worthy Keeler PA-C Entered By: Worthy Keeler on 11/10/2021 13:19:49

## 2021-11-15 ENCOUNTER — Other Ambulatory Visit: Payer: Self-pay | Admitting: Family Medicine

## 2021-11-15 DIAGNOSIS — F419 Anxiety disorder, unspecified: Secondary | ICD-10-CM

## 2021-11-16 ENCOUNTER — Other Ambulatory Visit: Payer: Self-pay | Admitting: Family Medicine

## 2021-11-16 DIAGNOSIS — L89311 Pressure ulcer of right buttock, stage 1: Secondary | ICD-10-CM

## 2021-11-16 DIAGNOSIS — L89220 Pressure ulcer of left hip, unstageable: Secondary | ICD-10-CM

## 2021-11-24 ENCOUNTER — Ambulatory Visit: Payer: Medicare Other | Admitting: Physician Assistant

## 2021-12-08 ENCOUNTER — Encounter: Payer: Medicare Other | Attending: Physician Assistant | Admitting: Physician Assistant

## 2021-12-08 ENCOUNTER — Encounter: Payer: Self-pay | Admitting: Urology

## 2021-12-08 DIAGNOSIS — L89623 Pressure ulcer of left heel, stage 3: Secondary | ICD-10-CM | POA: Diagnosis not present

## 2021-12-08 DIAGNOSIS — G809 Cerebral palsy, unspecified: Secondary | ICD-10-CM | POA: Insufficient documentation

## 2021-12-08 DIAGNOSIS — I87393 Chronic venous hypertension (idiopathic) with other complications of bilateral lower extremity: Secondary | ICD-10-CM | POA: Diagnosis not present

## 2021-12-08 DIAGNOSIS — I7025 Atherosclerosis of native arteries of other extremities with ulceration: Secondary | ICD-10-CM | POA: Diagnosis not present

## 2021-12-08 DIAGNOSIS — I872 Venous insufficiency (chronic) (peripheral): Secondary | ICD-10-CM | POA: Diagnosis not present

## 2021-12-08 DIAGNOSIS — L89310 Pressure ulcer of right buttock, unstageable: Secondary | ICD-10-CM | POA: Insufficient documentation

## 2021-12-09 NOTE — Progress Notes (Signed)
DAEL, HOWLAND ([id number]) Visit Report for 12/08/2021 Chief Complaint Document Details Patient Name: [name], [name]. Date of Service: 12/08/2021 9:15 AM Medical Record Number: [id number] Patient Account Number: [id number] Date of Birth/Sex: 1940-05-28 (81 y.o. M) Treating RN: Alycia Rossetti Primary Care Provider: Steele Sizer Other Clinician: Referring Provider: Steele Sizer Treating Provider/Extender: Skipper Cliche in Treatment: 6 Information Obtained from: Patient Chief Complaint Left heel pressure ulcer and right buttock pressure ulcer Electronic Signature(s) Signed: 12/08/2021 10:02:44 AM By: Worthy Keeler PA-C Previous Signature: 12/08/2021 10:01:47 AM Version By: Worthy Keeler PA-C Entered By: Worthy Keeler on 12/08/2021 10:02:44 [name] ([id number]) -------------------------------------------------------------------------------- Debridement Details Patient Name: [name]. Date of Service: 12/08/2021 9:15 AM Medical Record Number: [id number] Patient Account Number: [id number] Date of Birth/Sex: Jan 01, 1941 (81 y.o. M) Treating RN: Alycia Rossetti Primary Care Provider: Steele Sizer Other Clinician: Referring Provider: Steele Sizer Treating Provider/Extender: Skipper Cliche in Treatment: 6 Debridement Performed for Wound #1 Left Calcaneus Assessment: Performed By: Physician Tommie Sams., PA-C Debridement Type: Debridement Level of Consciousness (Pre- Awake and Alert procedure): Pre-procedure Verification/Time Out Yes - 10:00 Taken: Start Time: 10:00 Total Area Debrided (L x W): 1.5 (cm) x 1.5 (cm) = 2.25 (cm) Tissue and other material Viable, Non-Viable, Slough, Subcutaneous, Biofilm, Slough debrided: Level: Skin/Subcutaneous Tissue Debridement Description: Excisional Instrument: Curette Bleeding: Minimum Hemostasis Achieved: Pressure Procedural Pain: 0 Post Procedural Pain: 0 Response to Treatment: Procedure was  tolerated well Level of Consciousness (Post- Awake and Alert procedure): Post Debridement Measurements of Total Wound Length: (cm) 1.5 Stage: Category/Stage III Width: (cm) 1.5 Depth: (cm) 0.1 Volume: (cm) 0.177 Character of Wound/Ulcer Post Debridement: Stable Post Procedure Diagnosis Same as Pre-procedure Electronic Signature(s) Signed: 12/08/2021 2:37:10 PM By: Alycia Rossetti Signed: 12/08/2021 5:21:19 PM By: Worthy Keeler PA-C Entered By: Alycia Rossetti on 12/08/2021 10:11:32 [name] ([id number]) -------------------------------------------------------------------------------- Debridement Details Patient Name: [name]. Date of Service: 12/08/2021 9:15 AM Medical Record Number: [id number] Patient Account Number: [id number] Date of Birth/Sex: Dec 06, 1940 (81 y.o. M) Treating RN: Alycia Rossetti Primary Care Provider: Steele Sizer Other Clinician: Referring Provider: Steele Sizer Treating Provider/Extender: Skipper Cliche in Treatment: 6 Debridement Performed for Wound #2 Right Gluteus Assessment: Performed By: Physician Tommie Sams., PA-C Debridement Type: Debridement Level of Consciousness (Pre- Awake and Alert procedure): Pre-procedure Verification/Time Out Yes - 10:00 Taken: Start Time: 10:00 Total Area Debrided (L x W): 1.2 (cm) x 1 (cm) = 1.2 (cm) Tissue and other material Viable, Non-Viable, Eschar, Slough, Subcutaneous, Biofilm, Slough debrided: Level: Skin/Subcutaneous Tissue Debridement Description: Excisional Instrument: Curette Bleeding: Minimum Hemostasis Achieved: Pressure Procedural Pain: 0 Post Procedural Pain: 0 Response to Treatment: Procedure was tolerated well Level of Consciousness (Post- Awake and Alert procedure): Post Debridement Measurements of Total Wound Length: (cm) 1.2 Stage: Unstageable/Unclassified Width: (cm) 1 Depth: (cm) 0.3 Volume: (cm) 0.283 Character of Wound/Ulcer Post Debridement:  Stable Post Procedure Diagnosis Same as Pre-procedure Electronic Signature(s) Signed: 12/08/2021 2:37:10 PM By: Alycia Rossetti Signed: 12/08/2021 5:21:19 PM By: Worthy Keeler PA-C Entered By: Alycia Rossetti on 12/08/2021 10:13:19 [name] ([id number]) -------------------------------------------------------------------------------- HPI Details Patient Name: [name]. Date of Service: 12/08/2021 9:15 AM Medical Record Number: [id number] Patient Account Number: [id number] Date of Birth/Sex: [birth year] (81 y.o. M) Treating RN: Alycia Rossetti Primary Care Provider: Steele Sizer Other Clinician: Referring Provider: Steele Sizer Treating Provider/Extender: Skipper Cliche in Treatment: 6 History of Present Illness HPI Description: 10-27-2021 upon evaluation today patient presents for initial evaluation here  in the clinic concerning an issue that has been present since around June 1. Subsequently the patient's caregiver from [name] tells me that he has been treated mainly by herself she has been doing what she can to try to take care of this using DuoDERM over the area. There is a lot of skin that is actually loosen up over the top of the heel surface but not all of this is necessarily covering wound in fact there is a majority of this that is not wound related as far as the skin over top is concerned. Fortunately I do not see any signs of active infection locally or systemically at this time which is great news. He does have some slough over the surface of the wound but mainly the skin around the edges the thing I am most concerned with. It is trapping a lot of fluid to be honest. Patient does have a history of chronic venous insufficiency, cerebral palsy, and stated peripheral vascular disease. With that being said I do not see any evidence right now of active infection which is great news. No fevers, chills, nausea, vomiting, or diarrhea. 11-10-2021 upon evaluation today  patient appears to be doing well currently in regard to his wound on the heel. Fortunately there does not appear to be any signs of active infection locally or systemically at this time which is great news. Fortunately I do not see any evidence of infection which is also excellent. No fevers, chills, nausea, vomiting, or diarrhea. 12-08-2021 upon evaluation today patient appears to be doing well currently in regard to his heel although unfortunately has a right gluteal pressure ulcer. Fortunately there does not appear to be any signs of active infection locally or systemically which is great news and overall I am extremely pleased with where we stand today. Electronic Signature(s) Signed: 12/08/2021 5:10:56 PM By: Worthy Keeler PA-C Entered By: Worthy Keeler on 12/08/2021 17:10:55 [name] ([id number]) -------------------------------------------------------------------------------- Physical Exam Details Patient Name: [name] Date of Service: 12/08/2021 9:15 AM Medical Record Number: [id number] Patient Account Number: [id number] Date of Birth/Sex: 01-30-1941 (81 y.o. M) Treating RN: Alycia Rossetti Primary Care Provider: Steele Sizer Other Clinician: Referring Provider: Steele Sizer Treating Provider/Extender: Skipper Cliche in Treatment: 6 Constitutional Well-nourished and well-hydrated in no acute distress. Respiratory normal breathing without difficulty. Psychiatric this patient is able to make decisions and demonstrates good insight into disease process. Alert and Oriented x 3. pleasant and cooperative. Notes Upon inspection patient's wound bed actually showed signs of good granulation and epithelization at this point. Fortunately I do not see any evidence of infection at this time which is great news and overall I do believe that the patient is making good progress Electronic Signature(s) Signed: 12/08/2021 5:11:11 PM By: Worthy Keeler PA-C Entered By:  Worthy Keeler on 12/08/2021 17:11:11 [name] ([id number]) -------------------------------------------------------------------------------- Physician Orders Details Patient Name: [name]. Date of Service: 12/08/2021 9:15 AM Medical Record Number: [id number] Patient Account Number: [id number] Date of Birth/Sex: [birth year] (81 y.o. M) Treating RN: Alycia Rossetti Primary Care Provider: Steele Sizer Other Clinician: Referring Provider: Steele Sizer Treating Provider/Extender: Skipper Cliche in Treatment: 6 Verbal / Phone Orders: No Diagnosis Coding ICD-10 Coding Code Description [phone] Pressure ulcer of left heel, stage 3 L89.310 Pressure ulcer of right buttock, unstageable G80.8 Other cerebral palsy I73.89 Other specified peripheral vascular diseases I87.393 Chronic venous hypertension (idiopathic) with other complications of bilateral lower extremity Follow-up Appointments o Return Appointment in  3 weeks. Bathing/ Shower/ Hygiene o May shower; gently cleanse wound with antibacterial soap, rinse and pat dry prior to dressing wounds Anesthetic (Use 'Patient Medications' Section for Anesthetic Order Entry) o Lidocaine applied to wound bed Non-Wound Condition o Additional non-wound orders/instructions: - clean buttocks with soap and water , pat dry , apply calmoseptine ointment after each incontent episode Off-Loading o Roho cushion for wheelchair o Hospital bed/mattress o Gel mattress overlay (Group 1) o Other: - prevalon boots to bi lat feet / legs at all times to protect heels Additional Orders / Instructions o Other: - zinc oxide or destin to be used for skin breakdown in gluteal area Wound Treatment Wound #1 - Calcaneus Wound Laterality: Left Cleanser: Soap and Water 3 x Per Week/30 Days Discharge Instructions: Gently cleanse wound with antibacterial soap, rinse and pat dry prior to dressing wounds Primary Dressing: Silvercel  Small 2x2 (in/in) (Generic) 3 x Per Week/30 Days Discharge Instructions: Apply Silvercel Small 2x2 (in/in) as instructed Secondary Dressing: ABD Pad 5x9 (in/in) (Generic) 3 x Per Week/30 Days Discharge Instructions: Cover with ABD pad Secured With: Medipore Tape - 38M Medipore H Soft Cloth Surgical Tape, 2x2 (in/yd) (Generic) 3 x Per Week/30 Days Secured With: [employer] Sterile or Non-Sterile 6-ply 4.5x4 (yd/yd) (Generic) 3 x Per Week/30 Days Discharge Instructions: Apply Kerlix as directed Wound #2 - Gluteus Wound Laterality: Right Cleanser: Normal Saline 1 x Per Day/30 Days Discharge Instructions: Wash your hands with soap and water. Remove old dressing, discard into plastic bag and place into trash. Cleanse the wound with Normal Saline prior to applying a clean dressing using gauze sponges, not tissues or cotton balls. Do not scrub or use excessive force. Pat dry using gauze sponges, not tissue or cotton balls. Cleanser: Soap and Water 1 x Per Day/30 Days [name], [name] ([id number]) Discharge Instructions: Gently cleanse wound with antibacterial soap, rinse and pat dry prior to dressing wounds Cleanser: Wound Cleanser 1 x Per Day/30 Days Discharge Instructions: Wash your hands with soap and water. Remove old dressing, discard into plastic bag and place into trash. Cleanse the wound with Wound Cleanser prior to applying a clean dressing using gauze sponges, not tissues or cotton balls. Do not scrub or use excessive force. Pat dry using gauze sponges, not tissue or cotton balls. Primary Dressing: Iodosorb 40 (g) (DME) (Generic) 1 x Per Day/30 Days Discharge Instructions: Apply IodoSorb to wound bed only as directed. Secondary Dressing: Zetuvit Plus 4x4 (in/in) (DME) (Generic) 1 x Per Day/30 Days Discharge Instructions: needs to be changed daily due to incontinence Electronic Signature(s) Signed: 12/08/2021 2:37:10 PM By: Alycia Rossetti Signed: 12/08/2021 5:21:19 PM By: Worthy Keeler  PA-C Entered By: Alycia Rossetti on 12/08/2021 12:41:33 [name] ([id number]) -------------------------------------------------------------------------------- Problem List Details Patient Name: [name]. Date of Service: 12/08/2021 9:15 AM Medical Record Number: [id number] Patient Account Number: [id number] Date of Birth/Sex: 12-16-1940 (81 y.o. M) Treating RN: Alycia Rossetti Primary Care Provider: Steele Sizer Other Clinician: Referring Provider: Steele Sizer Treating Provider/Extender: Skipper Cliche in Treatment: 6 Active Problems ICD-10 Encounter Code Description Active Date MDM Diagnosis [phone] Pressure ulcer of left heel, stage 3 10/27/2021 No Yes L89.310 Pressure ulcer of right buttock, unstageable 12/08/2021 No Yes G80.8 Other cerebral palsy 10/27/2021 No Yes I73.89 Other specified peripheral vascular diseases 10/27/2021 No Yes I87.393 Chronic venous hypertension (idiopathic) with other complications of 06/29/9022 No Yes bilateral lower extremity Inactive Problems Resolved Problems Electronic Signature(s) Signed: 12/08/2021 10:02:33 AM By: Worthy Keeler PA-C  Previous Signature: 12/08/2021 10:01:41 AM Version By: Worthy Keeler PA-C Entered By: Worthy Keeler on 12/08/2021 10:02:33 [name] ([id number]) -------------------------------------------------------------------------------- Progress Note Details Patient Name: [name] Date of Service: 12/08/2021 9:15 AM Medical Record Number: [id number] Patient Account Number: [id number] Date of Birth/Sex: 22-Nov-1940 (81 y.o. M) Treating RN: Alycia Rossetti Primary Care Provider: Steele Sizer Other Clinician: Referring Provider: Steele Sizer Treating Provider/Extender: Skipper Cliche in Treatment: 6 Subjective Chief Complaint Information obtained from Patient Left heel pressure ulcer and right buttock pressure ulcer History of Present Illness (HPI) 10-27-2021 upon evaluation today  patient presents for initial evaluation here in the clinic concerning an issue that has been present since around June 1. Subsequently the patient's caregiver from [name] tells me that he has been treated mainly by herself she has been doing what she can to try to take care of this using DuoDERM over the area. There is a lot of skin that is actually loosen up over the top of the heel surface but not all of this is necessarily covering wound in fact there is a majority of this that is not wound related as far as the skin over top is concerned. Fortunately I do not see any signs of active infection locally or systemically at this time which is great news. He does have some slough over the surface of the wound but mainly the skin around the edges the thing I am most concerned with. It is trapping a lot of fluid to be honest. Patient does have a history of chronic venous insufficiency, cerebral palsy, and stated peripheral vascular disease. With that being said I do not see any evidence right now of active infection which is great news. No fevers, chills, nausea, vomiting, or diarrhea. 11-10-2021 upon evaluation today patient appears to be doing well currently in regard to his wound on the heel. Fortunately there does not appear to be any signs of active infection locally or systemically at this time which is great news. Fortunately I do not see any evidence of infection which is also excellent. No fevers, chills, nausea, vomiting, or diarrhea. 12-08-2021 upon evaluation today patient appears to be doing well currently in regard to his heel although unfortunately has a right gluteal pressure ulcer. Fortunately there does not appear to be any signs of active infection locally or systemically which is great news and overall I am extremely pleased with where we stand today. Objective Constitutional Well-nourished and well-hydrated in no acute distress. Vitals Time Taken: 9:45 AM, Height: 63 in, Weight:  135 lbs, BMI: 23.9, Temperature: 97.8 F, Pulse: 61 bpm, Respiratory Rate: 18 breaths/min, Blood Pressure: 96/49 mmHg. Respiratory normal breathing without difficulty. Psychiatric this patient is able to make decisions and demonstrates good insight into disease process. Alert and Oriented x 3. pleasant and cooperative. General Notes: Upon inspection patient's wound bed actually showed signs of good granulation and epithelization at this point. Fortunately I do not see any evidence of infection at this time which is great news and overall I do believe that the patient is making good progress Integumentary (Hair, Skin) Wound #1 status is Open. Original cause of wound was Gradually Appeared. The date acquired was: 09/22/2021. The wound has been in treatment 6 weeks. The wound is located on the Left Calcaneus. The wound measures 1.5cm length x 1.5cm width x 0.1cm depth; 1.767cm^2 area and 0.177cm^3 volume. There is Fat Layer (Subcutaneous Tissue) exposed. There is no tunneling or undermining noted. There is a medium  amount of serosanguineous drainage noted. There is small (1-33%) red, pink granulation within the wound bed. There is a large (67-100%) amount of necrotic tissue within the wound bed including Eschar and Adherent Slough. Wound #2 status is Open. Original cause of wound was Pressure Injury. The date acquired was: 11/07/2021. The wound is located on the Right Gluteus. The wound measures 1.2cm length x 1cm width x 0.1cm depth; 0.942cm^2 area and 0.094cm^3 volume. There is Fat Layer (Subcutaneous Tissue) exposed. There is no tunneling or undermining noted. There is a small amount of serosanguineous drainage noted. There is no granulation within the wound bed. There is a large (67-100%) amount of necrotic tissue within the wound bed including Eschar and Adherent Slough. [name], [name] ([id number]) Assessment Active Problems ICD-10 Pressure ulcer of left heel, stage 3 Pressure ulcer of  right buttock, unstageable Other cerebral palsy Other specified peripheral vascular diseases Chronic venous hypertension (idiopathic) with other complications of bilateral lower extremity Procedures Wound #1 Pre-procedure diagnosis of Wound #1 is a Pressure Ulcer located on the Left Calcaneus . There was a Excisional Skin/Subcutaneous Tissue Debridement with a total area of 2.25 sq cm performed by Tommie Sams., PA-C. With the following instrument(s): Curette to remove Viable and Non-Viable tissue/material. Material removed includes Subcutaneous Tissue, Slough, and Biofilm. No specimens were taken. A time out was conducted at 10:00, prior to the start of the procedure. A Minimum amount of bleeding was controlled with Pressure. The procedure was tolerated well with a pain level of 0 throughout and a pain level of 0 following the procedure. Post Debridement Measurements: 1.5cm length x 1.5cm width x 0.1cm depth; 0.177cm^3 volume. Post debridement Stage noted as Category/Stage III. Character of Wound/Ulcer Post Debridement is stable. Post procedure Diagnosis Wound #1: Same as Pre-Procedure Wound #2 Pre-procedure diagnosis of Wound #2 is a Pressure Ulcer located on the Right Gluteus . There was a Excisional Skin/Subcutaneous Tissue Debridement with a total area of 1.2 sq cm performed by Tommie Sams., PA-C. With the following instrument(s): Curette to remove Viable and Non-Viable tissue/material. Material removed includes Eschar, Subcutaneous Tissue, Slough, and Biofilm. No specimens were taken. A time out was conducted at 10:00, prior to the start of the procedure. A Minimum amount of bleeding was controlled with Pressure. The procedure was tolerated well with a pain level of 0 throughout and a pain level of 0 following the procedure. Post Debridement Measurements: 1.2cm length x 1cm width x 0.3cm depth; 0.283cm^3 volume. Post debridement Stage noted as Unstageable/Unclassified. Character of  Wound/Ulcer Post Debridement is stable. Post procedure Diagnosis Wound #2: Same as Pre-Procedure Plan Follow-up Appointments: Return Appointment in 3 weeks. Bathing/ Shower/ Hygiene: May shower; gently cleanse wound with antibacterial soap, rinse and pat dry prior to dressing wounds Anesthetic (Use 'Patient Medications' Section for Anesthetic Order Entry): Lidocaine applied to wound bed Non-Wound Condition: Additional non-wound orders/instructions: - clean buttocks with soap and water , pat dry , apply calmoseptine ointment after each incontent episode Off-Loading: Roho cushion for wheelchair Hospital bed/mattress Gel mattress overlay (Group 1) Other: - prevalon boots to bi lat feet / legs at all times to protect heels Additional Orders / Instructions: Other: - zinc oxide or destin to be used for skin breakdown in gluteal area WOUND #1: - Calcaneus Wound Laterality: Left Cleanser: Soap and Water 3 x Per Week/30 Days Discharge Instructions: Gently cleanse wound with antibacterial soap, rinse and pat dry prior to dressing wounds Primary Dressing: Silvercel Small 2x2 (in/in) (Generic) 3 x Per  Week/30 Days Discharge Instructions: Apply Silvercel Small 2x2 (in/in) as instructed Secondary Dressing: ABD Pad 5x9 (in/in) (Generic) 3 x Per Week/30 Days Discharge Instructions: Cover with ABD pad Secured With: Medipore Tape - 29M Medipore H Soft Cloth Surgical Tape, 2x2 (in/yd) (Generic) 3 x Per Week/30 Days Secured With: Kerlix Roll Sterile or Non-Sterile 6-ply 4.5x4 (yd/yd) (Generic) 3 x Per Week/30 Days Discharge Instructions: Apply Kerlix as directed WOUND #2: - Gluteus Wound Laterality: Right [name], [name] ([id number]) Cleanser: Normal Saline 1 x Per Day/30 Days Discharge Instructions: Wash your hands with soap and water. Remove old dressing, discard into plastic bag and place into trash. Cleanse the wound with Normal Saline prior to applying a clean dressing using gauze sponges, not  tissues or cotton balls. Do not scrub or use excessive force. Pat dry using gauze sponges, not tissue or cotton balls. Cleanser: Soap and Water 1 x Per Day/30 Days Discharge Instructions: Gently cleanse wound with antibacterial soap, rinse and pat dry prior to dressing wounds Cleanser: Wound Cleanser 1 x Per Day/30 Days Discharge Instructions: Wash your hands with soap and water. Remove old dressing, discard into plastic bag and place into trash. Cleanse the wound with Wound Cleanser prior to applying a clean dressing using gauze sponges, not tissues or cotton balls. Do not scrub or use excessive force. Pat dry using gauze sponges, not tissue or cotton balls. Primary Dressing: Iodosorb 40 (g) (DME) (Generic) 1 x Per Day/30 Days Discharge Instructions: Apply IodoSorb to wound bed only as directed. Secondary Dressing: Zetuvit Plus 4x4 (in/in) (DME) (Generic) 1 x Per Day/30 Days Discharge Instructions: needs to be changed daily due to incontinence 1. I am going to suggest that we continue with the silver alginate to the heel which I think is doing quite well. 2. I would recommend that we go ahead and utilize the Iodosorb/Iodoflex to the gluteal region which I think should do quite well we use a Zetuvit bordered foam dressing to cover. 3. I am good recommend as well the patient continue to monitor for any signs of worsening or infection. If anything changes the caregivers should contact the office and let us know so that we can advise. We will see patient back for reevaluation in 1 week here in the clinic. If anything worsens or changes patient will contact our office for additional recommendations. Electronic Signature(s) Signed: 12/08/2021 5:11:56 PM By: Worthy Keeler PA-C Entered By: Worthy Keeler on 12/08/2021 17:11:55 [name] ([id number]) -------------------------------------------------------------------------------- SuperBill Details Patient Name: [name] Date  of Service: 12/08/2021 Medical Record Number: [id number] Patient Account Number: [id number] Date of Birth/Sex: February 14, 1941 (81 y.o. M) Treating RN: Alycia Rossetti Primary Care Provider: Steele Sizer Other Clinician: Referring Provider: Steele Sizer Treating Provider/Extender: Skipper Cliche in Treatment: 6 Diagnosis Coding ICD-10 Codes Code Description [phone] Pressure ulcer of left heel, stage 3 L89.310 Pressure ulcer of right buttock, unstageable G80.8 Other cerebral palsy I73.89 Other specified peripheral vascular diseases I87.393 Chronic venous hypertension (idiopathic) with other complications of bilateral lower extremity Facility Procedures CPT4 Code: [id number] Description: 95093 - DEB SUBQ TISSUE 20 SQ CM/< Modifier: Quantity: 1 CPT4 Code: Description: ICD-10 Diagnosis Description L89.623 Pressure ulcer of left heel, stage 3 L89.310 Pressure ulcer of right buttock, unstageable Modifier: Quantity: Physician Procedures CPT4 Code: [id number] Description: 11042 - WC PHYS SUBQ TISS 20 SQ CM Modifier: Quantity: 1 CPT4 Code: Description: ICD-10 Diagnosis Description L89.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

## 2021-12-10 NOTE — Progress Notes (Signed)
[name], [name] ([id number]) Visit Report for 12/08/2021 Arrival Information Details Patient Name: [name], [name]. Date of Service: 12/08/2021 9:15 AM Medical Record Number: [id number] Patient Account Number: [id number] Date of Birth/Sex: 11/25/1940 (81 y.o. M) Treating RN: Alycia Rossetti Primary Care Byron Tipping: Steele Sizer Other Clinician: Referring Shakeria Robinette: Steele Sizer Treating Inaaya Vellucci/Extender: Skipper Cliche in Treatment: 6 Visit Information History Since Last Visit Added or deleted any medications: No Patient Arrived: Wheel Chair Any new allergies or adverse reactions: No Arrival Time: 09:40 Had a fall or experienced change in No Transfer Assistance: [employer] streamer activities of daily living that may affect Patient Requires Transmission-Based Precautions: No risk of falls: Patient Has Alerts: No Signs or symptoms of abuse/neglect since last visito No Hospitalized since last visit: No Has Dressing in Place as Prescribed: Yes Pain Present Now: No Electronic Signature(s) Signed: 12/08/2021 2:37:10 PM By: Alycia Rossetti Entered By: Alycia Rossetti on 12/08/2021 09:40:41 [name] ([id number]) -------------------------------------------------------------------------------- Encounter Discharge Information Details Patient Name: [name]. Date of Service: 12/08/2021 9:15 AM Medical Record Number: [id number] Patient Account Number: [id number] Date of Birth/Sex: [birth year] (81 y.o. M) Treating RN: Alycia Rossetti Primary Care Marliyah Reid: Steele Sizer Other Clinician: Referring Akire Rennert: Steele Sizer Treating Granite Godman/Extender: Skipper Cliche in Treatment: 6 Encounter Discharge Information Items Post Procedure Vitals Discharge Condition: Stable Temperature (F): 97.8 Ambulatory Status: Wheelchair Pulse (bpm): 61 Discharge Destination: Home Respiratory Rate (breaths/min): 18 Transportation: Other Blood Pressure (mmHg): 96/41 Accompanied By:  caregiver Schedule Follow-up Appointment: No Clinical Summary of Care: Electronic Signature(s) Signed: 12/08/2021 2:37:10 PM By: Alycia Rossetti Entered By: Alycia Rossetti on 12/08/2021 10:45:25 [name] ([id number]) -------------------------------------------------------------------------------- Lower Extremity Assessment Details Patient Name: [name]. Date of Service: 12/08/2021 9:15 AM Medical Record Number: [id number] Patient Account Number: [id number] Date of Birth/Sex: 07-Jan-1941 (81 y.o. M) Treating RN: Alycia Rossetti Primary Care Jordie Schreur: Steele Sizer Other Clinician: Referring Bernie Fobes: Steele Sizer Treating Lillianah Swartzentruber/Extender: Jeri Cos Weeks in Treatment: 6 Electronic Signature(s) Signed: 12/08/2021 2:37:10 PM By: Alycia Rossetti Entered By: Alycia Rossetti on 12/08/2021 09:57:22 Macias, Kevin Collin ([id number]) -------------------------------------------------------------------------------- Multi Wound Chart Details Patient Name: [name]. Date of Service: 12/08/2021 9:15 AM Medical Record Number: [id number] Patient Account Number: [id number] Date of Birth/Sex: [birth year] (81 y.o. M) Treating RN: Alycia Rossetti Primary Care Geoffry Bannister: Steele Sizer Other Clinician: Referring Abbygail Willho[place]: Steele Sizer Treating Zarah Carbon/Extender: Skipper Cliche in Treatment: 6 Vital Signs Height(in): 63 Pulse(bpm): 61 Weight(lbs): 135 Blood Pressure(mmHg): 96/49 Body Mass Index(BMI): 23.9 Temperature(F): 97.8 Respiratory Rate(breaths/min): 18 Photos: [N/A:N/A] Wound Location: Left Calcaneus Right Gluteus N/A Wounding Event: Gradually Appeared Pressure Injury N/A Primary Etiology: Pressure Ulcer Pressure Ulcer N/A Date Acquired: 09/22/2021 11/07/2021 N/A Weeks of Treatment: 6 0 N/A Wound Status: Open Open N/A Wound Recurrence: No No N/A Measurements L x W x D (cm) 1.5x1.5x0.1 1.2x1x0.1 N/A Area (cm) : 1.767 0.942 N/A Volume (cm) : 0.177 0.094 N/A %  Reduction in Area: 62.50% 0.00% N/A % Reduction in Volume: 62.40% 0.00% N/A Classification: Category/Stage III Unstageable/Unclassified N/A Exudate Amount: Medium Medium N/A Exudate Type: Serosanguineous Serosanguineous N/A Exudate Color: red, brown red, brown N/A Granulation Amount: Small (1-33%) None Present (0%) N/A Granulation Quality: Red, Pink N/A N/A Necrotic Amount: Large (67-100%) Large (67-100%) N/A Necrotic Tissue: Eschar, Adherent [place] N/A Exposed Structures: Fat Layer (Subcutaneous Tissue): Fascia: No N/A Yes Fat Layer (Subcutaneous Tissue): Fascia: No No Tendon: No Tendon: No Muscle: No Muscle: No Joint: No Joint: No Bone: No Bone: No Epithelialization: None None N/A Treatment Notes  Electronic Signature(s) Signed: 12/08/2021 2:37:10 PM By: Alycia Rossetti Entered By: Alycia Rossetti on 12/08/2021 10:08:48 [name] ([id number]) -------------------------------------------------------------------------------- Multi-Disciplinary Care Plan Details Patient Name: [name], Kevin A. Date of Service: 12/08/2021 9:15 AM Medical Record Number: [id number] Patient Account Number: [id number] Date of Birth/Sex: 04/20/1941 (81 y.o. M) Treating RN: Alycia Rossetti Primary Care Casin Federici: Steele Sizer Other Clinician: Referring Marcele Kosta: Steele Sizer Treating Rossi Silvestro/Extender: Skipper Cliche in Treatment: 6 Active Inactive Wound/Skin Impairment Nursing Diagnoses: Knowledge deficit related to ulceration/compromised skin integrity Goals: Patient/caregiver will verbalize understanding of skin care regimen Date Initiated: 10/27/2021 Target Resolution Date: 11/27/2021 Goal Status: Active Ulcer/skin breakdown will have a volume reduction of 30% by week 4 Date Initiated: 10/27/2021 Target Resolution Date: 11/27/2021 Goal Status: Active Ulcer/skin breakdown will have a volume reduction of 50% by week 8 Date Initiated: 10/27/2021 Target Resolution  Date: 12/28/2021 Goal Status: Active Ulcer/skin breakdown will have a volume reduction of 80% by week 12 Date Initiated: 10/27/2021 Target Resolution Date: 01/27/2022 Goal Status: Active Ulcer/skin breakdown will heal within 14 weeks Date Initiated: 10/27/2021 Target Resolution Date: 02/27/2022 Goal Status: Active Interventions: Assess patient/caregiver ability to obtain necessary supplies Assess patient/caregiver ability to perform ulcer/skin care regimen upon admission and as needed Assess ulceration(s) every visit Notes: Electronic Signature(s) Signed: 12/08/2021 2:37:10 PM By: Alycia Rossetti Entered By: Alycia Rossetti on 12/08/2021 10:08:38 [name] ([id number]) -------------------------------------------------------------------------------- Pain Assessment Details Patient Name: [name]. Date of Service: 12/08/2021 9:15 AM Medical Record Number: [id number] Patient Account Number: [id number] Date of Birth/Sex: 1940-10-19 (81 y.o. M) Treating RN: Alycia Rossetti Primary Care Bellamarie Pflug: Steele Sizer Other Clinician: Referring Erubiel Manasco: Steele Sizer Treating Raiyan Dalesandro/Extender: Skipper Cliche in Treatment: 6 Active Problems Location of Pain Severity and Description of Pain Patient Has Paino No Site Locations Pain Management and Medication Current Pain Management: Electronic Signature(s) Signed: 12/08/2021 2:37:10 PM By: Alycia Rossetti Entered By: Alycia Rossetti on 12/08/2021 09:41:38 Rosana Berger Kevin Collin ([id number]) -------------------------------------------------------------------------------- Patient/Caregiver Education Details Patient Name: [name]. Date of Service: 12/08/2021 9:15 AM Medical Record Number: [id number] Patient Account Number: [id number] Date of Birth/Gender: 09-19-1940 (81 y.o. M) Treating RN: Cornell Barman Primary Care Physician: Steele Sizer Other Clinician: Referring Physician: Steele Sizer Treating Physician/Extender: Skipper Cliche in Treatment: 6 Education Assessment Education Provided To: Patient Education Topics Provided Wound Debridement: Handouts: Wound Debridement Methods: Demonstration, Explain/Verbal Responses: State content correctly Electronic Signature(s) Signed: 12/08/2021 5:20:01 PM By: Gretta Cool, BSN, RN, CWS, Kim RN, BSN Entered By: Gretta Cool, BSN, RN, CWS, Kim on 12/08/2021 17:13:31 [name] ([id number]) -------------------------------------------------------------------------------- Wound Assessment Details Patient Name: [name]. Date of Service: 12/08/2021 9:15 AM Medical Record Number: [id number] Patient Account Number: [id number] Date of Birth/Sex: [birth year] (81 y.o. M) Treating RN: Alycia Rossetti Primary Care Lenell Lama: Steele Sizer Other Clinician: Referring Oluwadamilare Tobler: Steele Sizer Treating Thedford Bunton/Extender: Skipper Cliche in Treatment: 6 Wound Status Wound Number: 1 Primary Etiology: Pressure Ulcer Wound Location: Left Calcaneus Wound Status: Open Wounding Event: Gradually Appeared Date Acquired: 09/22/2021 Weeks Of Treatment: 6 Clustered Wound: No Photos Wound Measurements Length: (cm) 1.5 Width: (cm) 1.5 Depth: (cm) 0.1 Area: (cm) 1.767 Volume: (cm) 0.177 % Reduction in Area: 62.5% % Reduction in Volume: 62.4% Epithelialization: None Tunneling: No Undermining: No Wound Description Classification: Category/Stage III Exudate Amount: Medium Exudate Type: Serosanguineous Exudate Color: red, brown Foul Odor After Cleansing: No Slough/Fibrino Yes Wound Bed Granulation Amount: Small (1-33%) Exposed Structure Granulation Quality: Red, Pink Fascia Exposed: No Necrotic Amount: Large (67-100%) Fat Layer (Subcutaneous Tissue) Exposed: Yes Necrotic  Quality: Eschar, Adherent Slough Tendon Exposed: No Muscle Exposed: No Joint Exposed: No Bone Exposed: No Treatment Notes Wound #1 (Calcaneus) Wound Laterality: Left Cleanser Soap and  Water Discharge Instruction: Gently cleanse wound with antibacterial soap, rinse and pat dry prior to dressing wounds Peri-Wound Care DUKE, WEISENSEL ([id number]) Topical Primary Dressing Silvercel Small 2x2 (in/in) Discharge Instruction: Apply Silvercel Small 2x2 (in/in) as instructed Secondary Dressing ABD Pad 5x9 (in/in) Discharge Instruction: Cover with ABD pad Secured With Medipore Tape - 39M Medipore H Soft Cloth Surgical Tape, 2x2 (in/yd) Kerlix Roll Sterile or Non-Sterile 6-ply 4.5x4 (yd/yd) Discharge Instruction: Apply Kerlix as directed Compression Wrap Compression Stockings Add-Ons Electronic Signature(s) Signed: 12/08/2021 2:37:10 PM By: Alycia Rossetti Entered By: Alycia Rossetti on 12/08/2021 09:59:28 [name] ([id number]) -------------------------------------------------------------------------------- Wound Assessment Details Patient Name: [name]. Date of Service: 12/08/2021 9:15 AM Medical Record Number: [id number] Patient Account Number: [id number] Date of Birth/Sex: Aug 17, 1940 (81 y.o. M) Treating RN: Alycia Rossetti Primary Care Sylis Ketchum: Steele Sizer Other Clinician: Referring Laruth Hanger: Steele Sizer Treating Justise Ehmann/Extender: Skipper Cliche in Treatment: 6 Wound Status Wound Number: 2 Primary Etiology: Pressure Ulcer Wound Location: Right Gluteus Wound Status: Open Wounding Event: Pressure Injury Date Acquired: 11/07/2021 Weeks Of Treatment: 0 Clustered Wound: No Photos Wound Measurements Length: (cm) 1.2 Width: (cm) 1 Depth: (cm) 0.1 Area: (cm) 0.942 Volume: (cm) 0.094 % Reduction in Area: 0% % Reduction in Volume: 0% Epithelialization: None Tunneling: No Undermining: No Wound Description Classification: Category/Stage III Exudate Amount: Small Exudate Type: Serosanguineous Exudate Color: red, brown Foul Odor After Cleansing: No Slough/Fibrino Yes Wound Bed Granulation Amount: None Present (0%) Exposed  Structure Necrotic Amount: Large (67-100%) Fascia Exposed: No Necrotic Quality: Eschar, Adherent Slough Fat Layer (Subcutaneous Tissue) Exposed: Yes Tendon Exposed: No Muscle Exposed: No Joint Exposed: No Bone Exposed: No Treatment Notes Wound #2 (Gluteus) Wound Laterality: Right Cleanser Normal Saline Discharge Instruction: Wash your hands with soap and water. Remove old dressing, discard into plastic bag and place into trash. Cleanse the wound with Normal Saline prior to applying a clean dressing using gauze sponges, not tissues or cotton balls. Do not scrub or use excessive force. Pat dry using gauze sponges, not tissue or cotton balls. Soap and Water GLENDELL, FOUSE ([id number]) Discharge Instruction: Gently cleanse wound with antibacterial soap, rinse and pat dry prior to dressing wounds Wound Cleanser Discharge Instruction: Wash your hands with soap and water. Remove old dressing, discard into plastic bag and place into trash. Cleanse the wound with Wound Cleanser prior to applying a clean dressing using gauze sponges, not tissues or cotton balls. Do not scrub or use excessive force. Pat dry using gauze sponges, not tissue or cotton balls. Peri-Wound Care Topical Primary Dressing Iodosorb 40 (g) Discharge Instruction: Apply IodoSorb to wound bed only as directed. Secondary Dressing Zetuvit Plus 4x4 (in/in) Discharge Instruction: needs to be changed daily due to incontinence Secured With Compression Wrap Compression Stockings Add-Ons Electronic Signature(s) Signed: 12/08/2021 2:37:10 PM By: Alycia Rossetti Entered By: Alycia Rossetti on 12/08/2021 10:24:41 [name] ([id number]) -------------------------------------------------------------------------------- Vitals Details Patient Name: [name]. Date of Service: 12/08/2021 9:15 AM Medical Record Number: [id number] Patient Account Number: [id number] Date of Birth/Sex: 1940-09-24 (81 y.o. M) Treating RN:  Alycia Rossetti Primary Care Dashley Monts: Steele Sizer Other Clinician: Referring Rutha Melgoza: Steele Sizer Treating Fransisco Messmer/Extender: Skipper Cliche in Treatment: 6 Vital Signs Time Taken: 09:45 Temperature (F): 97.8 Height (in): 63 Pulse (bpm): 61 Weight (lbs): 135 Respiratory Rate (breaths/min): 18 Body Mass Index (BMI):  23.9 Blood Pressure (mmHg): 96/49 Reference Range: 80 - 120 mg / dl Electronic Signature(s) Signed: 12/08/2021 2:37:10 PM By: Alycia Rossetti Entered By: Alycia Rossetti on 12/08/2021 09:41:32

## 2021-12-19 ENCOUNTER — Other Ambulatory Visit: Payer: Self-pay | Admitting: Family Medicine

## 2021-12-19 ENCOUNTER — Other Ambulatory Visit: Payer: Self-pay | Admitting: Nurse Practitioner

## 2021-12-19 DIAGNOSIS — K5909 Other constipation: Secondary | ICD-10-CM

## 2021-12-19 DIAGNOSIS — N390 Urinary tract infection, site not specified: Secondary | ICD-10-CM

## 2021-12-19 DIAGNOSIS — I700 Atherosclerosis of aorta: Secondary | ICD-10-CM

## 2021-12-19 DIAGNOSIS — F320 Major depressive disorder, single episode, mild: Secondary | ICD-10-CM

## 2021-12-19 DIAGNOSIS — E441 Mild protein-calorie malnutrition: Secondary | ICD-10-CM

## 2021-12-19 DIAGNOSIS — R350 Frequency of micturition: Secondary | ICD-10-CM

## 2021-12-19 NOTE — Telephone Encounter (Signed)
Last seen 6/23

## 2021-12-20 NOTE — Telephone Encounter (Signed)
Requested Prescriptions  Pending Prescriptions Disp Refills  . Calcium Carb-Cholecalciferol 500-10 MG-MCG TABS [Pharmacy Med Name: CALCIUM CARB-CHOLECALCIFEROL 500-10] 180 tablet 0    Sig: TAKE 1 TABLET BY MOUTH 2 TIMES A DAY     Endocrinology: Minerals - Calcium Supplementation with Vitamin D Passed - 12/19/2021  4:30 PM      Passed - Ca in normal range and within 360 days    Calcium  Date Value Ref Range Status  09/22/2021 9.4 8.9 - 10.3 mg/dL Final   Calcium, Total  Date Value Ref Range Status  08/14/2013 8.4 (L) 8.5 - 10.1 mg/dL Final         Passed - Vitamin D in normal range and within 360 days    Vit D, 25-Hydroxy  Date Value Ref Range Status  08/15/2021 50 30 - 100 ng/mL Final    Comment:    Vitamin D Status         25-OH Vitamin D: . Deficiency:                    <20 ng/mL Insufficiency:             20 - 29 ng/mL Optimal:                 > or = 30 ng/mL . For 25-OH Vitamin D testing on patients on  D2-supplementation and patients for whom quantitation  of D2 and D3 fractions is required, the [employer](TM) 25-OH VIT D, (D2,D3), LC/MS/MS is recommended: order  code [id number] (patients >37yr). See Note 1 . Note 1 . For additional information, please refer to  http://education.[facility].com/faq/FAQ199  (This link is being provided for informational/ educational purposes only.)          Passed - Valid encounter within last 12 months    Recent Outpatient Visits          2 months ago Pressure injury of left heel, stage 2 [facility]   4 months [facility]   6 months ago [facility]   7 months ago [facility]   1 year [facility]      Future  Appointments            In 1 month [place]   In 1 month [facility]  In 8 months [facility] S[facility]

## 2021-12-29 ENCOUNTER — Encounter: Payer: Medicare Other | Attending: Physician Assistant | Admitting: Physician Assistant

## 2021-12-29 DIAGNOSIS — G808 Other cerebral palsy: Secondary | ICD-10-CM | POA: Diagnosis not present

## 2021-12-29 DIAGNOSIS — L89310 Pressure ulcer of right buttock, unstageable: Secondary | ICD-10-CM | POA: Diagnosis not present

## 2021-12-29 DIAGNOSIS — I87393 Chronic venous hypertension (idiopathic) with other complications of bilateral lower extremity: Secondary | ICD-10-CM | POA: Insufficient documentation

## 2021-12-29 DIAGNOSIS — I7389 Other specified peripheral vascular diseases: Secondary | ICD-10-CM | POA: Diagnosis not present

## 2021-12-29 DIAGNOSIS — L89623 Pressure ulcer of left heel, stage 3: Secondary | ICD-10-CM | POA: Diagnosis present

## 2021-12-29 NOTE — Progress Notes (Addendum)
HARVARD, ZEISS ([id number]) Visit Report for 12/29/2021 Arrival Information Details Patient Name: [name], BUSTA. Date of Service: 12/29/2021 9:15 AM Medical Record Number: [id number] Patient Account Number: [id number] Date of Birth/Sex: 02-25-1941 (81 y.o. M) Treating RN: Cornell Barman Primary Care Tulani Kidney: Steele Sizer Other Clinician: Referring Jerald Hennington: Steele Sizer Treating Daliana Leverett/Extender: Skipper Cliche in Treatment: 9 Visit Information History Since Last Visit Pain Present Now: No Patient Arrived: Wheel Chair Arrival Time: 09:26 Accompanied By: self Transfer Assistance: None Patient Identification Verified: Yes Secondary Verification Process Completed: Yes Patient Requires Transmission-Based Precautions: No Patient Has Alerts: No Notes Patient remained in chair during visit. Electronic Signature(s) Signed: 12/29/2021 4:58:49 PM By: Gretta Cool, BSN, RN, CWS, Kim RN, BSN Entered By: Gretta Cool, BSN, RN, CWS, Kim on 12/29/2021 10:01:13 [name] ([id number]) -------------------------------------------------------------------------------- Encounter Discharge Information Details Patient Name: HORACIO, [name]. Date of Service: 12/29/2021 9:15 AM Medical Record Number: [id number] Patient Account Number: [id number] Date of Birth/Sex: [birth year] (81 y.o. M) Treating RN: Cornell Barman Primary Care Khaniyah Bezek: Steele Sizer Other Clinician: Referring Leondro Coryell: Steele Sizer Treating Omni Dunsworth/Extender: Skipper Cliche in Treatment: 9 Encounter Discharge Information Items Post Procedure Vitals Discharge Condition: Stable Unable to obtain vitals Reason: limited time Ambulatory Status: Ambulatory Discharge Destination: [place] Telephoned: No Orders Sent: Yes Transportation: Private Auto Accompanied By: caregiver Schedule Follow-up Appointment: Yes Clinical Summary of Care: Electronic Signature(s) Signed: 12/29/2021 11:35:52 AM By: Gretta Cool, BSN, RN,  CWS, Kim RN, BSN Entered By: Gretta Cool, BSN, RN, CWS, Kim on 12/29/2021 11:35:52 [name] ([id number]) -------------------------------------------------------------------------------- Lower Extremity Assessment Details Patient Name: [name], [name] Macias. Date of Service: 12/29/2021 9:15 AM Medical Record Number: [id number] Patient Account Number: [id number] Date of Birth/Sex: [birth year] (81 y.o. M) Treating RN: Cornell Barman Primary Care Leann Mayweather: Steele Sizer Other Clinician: Referring Justine Dines: Steele Sizer Treating Owin Vignola/Extender: Skipper Cliche in Treatment: 9 Edema Assessment Assessed: [Left: Yes] [Right: No] Edema: [Left: N] [Right: o] Vascular Assessment Pulses: Dorsalis Pedis Palpable: [Left:Yes] Electronic Signature(s) Signed: 12/29/2021 4:58:49 PM By: Gretta Cool, BSN, RN, CWS, Kim RN, BSN Entered By: Gretta Cool, BSN, RN, CWS, Kim on 12/29/2021 09:53:14 [name] ([id number]) -------------------------------------------------------------------------------- Multi Wound Chart Details Patient Name: [name]. Date of Service: 12/29/2021 9:15 AM Medical Record Number: [id number] Patient Account Number: [id number] Date of Birth/Sex: 03-29-1941 (81 y.o. M) Treating RN: Cornell Barman Primary Care Jacqulin Brandenburger: Steele Sizer Other Clinician: Referring Nelta Caudill: Steele Sizer Treating Melton Walls/Extender: Skipper Cliche in Treatment: 9 Vital Signs Height(in): 63 Pulse(bpm): 52 Weight(lbs): 135 Blood Pressure(mmHg): 124/76 Body Mass Index(BMI): 23.9 Temperature(F): Respiratory Rate(breaths/min): 18 Photos: [1:No Photos] [2:No Photos] [N/Macias:N/Macias] Wound Location: [1:Left Calcaneus] [2:Right Gluteus] [N/Macias:N/Macias] Wounding Event: [1:Gradually Appeared] [2:Pressure Injury] [N/Macias:N/Macias] Primary Etiology: [1:Pressure Ulcer] [2:Pressure Ulcer] [N/Macias:N/Macias] Date Acquired: [1:09/22/2021] [2:11/07/2021] [N/Macias:N/Macias] Weeks of Treatment: [1:9] [2:3] [N/Macias:N/Macias] Wound Status: [1:Open]  [2:Open] [N/Macias:N/Macias] Wound Recurrence: [1:No] [2:No] [N/Macias:N/Macias] Measurements L x W x D (cm) [1:0.7x0.5x0.1] [2:1.1x1.2x0.4] [N/Macias:N/Macias] Area (cm) : [1:0.275] [2:1.037] [N/Macias:N/Macias] Volume (cm) : [1:0.027] [2:0.415] [N/Macias:N/Macias] % Reduction in Area: [1:94.20%] [2:-10.10%] [N/Macias:N/Macias] % Reduction in Volume: [1:94.30%] [2:-341.50%] [N/Macias:N/Macias] Classification: [1:Category/Stage III] [2:Category/Stage III] [N/Macias:N/Macias] Exudate Amount: [1:Medium] [2:Small] [N/Macias:N/Macias] Exudate Type: [1:Serosanguineous] [2:Serosanguineous] [N/Macias:N/Macias] Exudate Color: [1:red, brown] [2:red, brown] [N/Macias:N/Macias] Granulation Amount: [1:Small (1-33%)] [2:None Present (0%)] [N/Macias:N/Macias] Granulation Quality: [1:Red, Pink] [2:N/Macias] [N/Macias:N/Macias] Necrotic Amount: [1:Large (67-100%)] [2:Large (67-100%)] [N/Macias:N/Macias] Exposed Structures: [1:Fat Layer (Subcutaneous Tissue): Yes Fascia: No Tendon: No Muscle: No Joint: No Bone: No] [2:Fat Layer (Subcutaneous Tissue): Yes Fascia: No Tendon: No Muscle: No Joint: No Bone: No] [N/Macias:N/Macias] Epithelialization: [  1:Small (1-33%)] [2:None] [N/Macias:N/Macias] Debridement: [1:N/Macias] [2:Debridement - Excisional] [N/Macias:N/Macias] Pre-procedure Verification/Time N/Macias [2:10:13] [N/Macias:N/Macias] Out Taken: Pain Control: [1:N/Macias] [2:Lidocaine 4% Topical Solution] [N/Macias:N/Macias] Tissue Debrided: [1:N/Macias] [2:Subcutaneous, Slough] [N/Macias:N/Macias] Level: [1:N/Macias] [2:Skin/Subcutaneous Tissue] [N/Macias:N/Macias] Debridement Area (sq cm): [1:N/Macias] [2:1.32] [N/Macias:N/Macias] Instrument: [1:N/Macias] [2:Curette] [N/Macias:N/Macias] Bleeding: [1:N/Macias] [2:Minimum] [N/Macias:N/Macias] Hemostasis Achieved: [1:N/Macias] [2:Pressure] [N/Macias:N/Macias] Debridement Treatment [1:N/Macias] [2:Procedure was tolerated well] [N/Macias:N/Macias] Response: Post Debridement [1:N/Macias] [2:1.1x1.2x0.5] [N/Macias:N/Macias] Measurements L x W x D (cm) Post Debridement Volume: [1:N/Macias] [0:0.174] [N/Macias:N/Macias] (cm) Post Debridement Stage: [1:N/Macias N/Macias] [2:Category/Stage III Debridement] [N/Macias:N/Macias N/Macias] Treatment Notes Electronic Signature(s) YONIEL, ARKWRIGHT ([id number]) Signed:  12/29/2021 11:32:48 AM By: Gretta Cool, BSN, RN, CWS, Kim RN, BSN Entered By: Gretta Cool, BSN, RN, CWS, Kim on 12/29/2021 11:32:47 [name] ([id number]) -------------------------------------------------------------------------------- Multi-Disciplinary Care Plan Details Patient Name: [name], [name]. Date of Service: 12/29/2021 9:15 AM Medical Record Number: [id number] Patient Account Number: [id number] Date of Birth/Sex: [birth year] (81 y.o. M) Treating RN: Cornell Barman Primary Care Nikie Cid: Steele Sizer Other Clinician: Referring Aline Wesche: Steele Sizer Treating Lititia Sen/Extender: Skipper Cliche in Treatment: 9 Active Inactive Pressure Nursing Diagnoses: Knowledge deficit related to causes and risk factors for pressure ulcer development Knowledge deficit related to management of pressures ulcers Goals: Patient will remain free from development of additional pressure ulcers Date Initiated: 12/29/2021 Target Resolution Date: 12/29/2021 Goal Status: Active Patient/caregiver will verbalize risk factors for pressure ulcer development Date Initiated: 12/29/2021 Target Resolution Date: 12/29/2021 Goal Status: Active Patient/caregiver will verbalize understanding of pressure ulcer management Date Initiated: 12/29/2021 Target Resolution Date: 12/29/2021 Goal Status: Active Interventions: Assess: immobility, friction, shearing, incontinence upon admission and as needed Notes: Wound/Skin Impairment Nursing Diagnoses: Knowledge deficit related to ulceration/compromised skin integrity Goals: Patient/caregiver will verbalize understanding of skin care regimen Date Initiated: 10/27/2021 Target Resolution Date: 11/27/2021 Goal Status: Active Ulcer/skin breakdown will have Macias volume reduction of 30% by week 4 Date Initiated: 10/27/2021 Target Resolution Date: 11/27/2021 Goal Status: Active Ulcer/skin breakdown will have Macias volume reduction of 50% by week 8 Date Initiated: 10/27/2021 Target Resolution  Date: 12/28/2021 Goal Status: Active Ulcer/skin breakdown will have Macias volume reduction of 80% by week 12 Date Initiated: 10/27/2021 Target Resolution Date: 01/27/2022 Goal Status: Active Ulcer/skin breakdown will heal within 14 weeks Date Initiated: 10/27/2021 Target Resolution Date: 02/27/2022 Goal Status: Active Interventions: Assess patient/caregiver ability to obtain necessary supplies Assess patient/caregiver ability to perform ulcer/skin care regimen upon admission and as needed Assess ulceration(s) every visit Notes: Electronic Signature(s) CHALLEN, SPAINHOUR ([id number]) Signed: 12/29/2021 11:07:19 AM By: Gretta Cool, BSN, RN, CWS, Kim RN, BSN Entered By: Gretta Cool, BSN, RN, CWS, Kim on 12/29/2021 11:07:19 [name] ([id number]) -------------------------------------------------------------------------------- Pain Assessment Details Patient Name: [name]. Date of Service: 12/29/2021 9:15 AM Medical Record Number: [id number] Patient Account Number: [id number] Date of Birth/Sex: February 22, 1941 (81 y.o. M) Treating RN: Cornell Barman Primary Care Tareq Dwan: Steele Sizer Other Clinician: Referring Lucero Auzenne: Steele Sizer Treating Ellaina Schuler/Extender: Skipper Cliche in Treatment: 9 Active Problems Location of Pain Severity and Description of Pain Patient Has Paino Patient Unable to Respond Site Locations Pain Management and Medication Current Pain Management: Electronic Signature(s) Signed: 12/29/2021 4:58:49 PM By: Gretta Cool, BSN, RN, CWS, Kim RN, BSN Entered By: Gretta Cool, BSN, RN, CWS, Kim on 12/29/2021 10:01:21 [name] ([id number]) -------------------------------------------------------------------------------- Patient/Caregiver Education Details Patient Name: [name] Date of Service: 12/29/2021 9:15 AM Medical Record Number: [id number] Patient Account Number: [id number] Date of Birth/Gender: 12/25/1940 (81 y.o. M) Treating RN: Cornell Barman Primary Care  Physician: Steele Sizer Other Clinician: Referring  Physician: Steele Sizer Treating Physician/Extender: Skipper Cliche in Treatment: 9 Education Assessment Education Provided To: Caregiver Education Topics Provided Wound/Skin Impairment: Handouts: Caring for Your Ulcer, Other: continue wound care as ordered. Electronic Signature(s) Signed: 12/29/2021 4:58:49 PM By: Gretta Cool, BSN, RN, CWS, Kim RN, BSN Entered By: Gretta Cool, BSN, RN, CWS, Kim on 12/29/2021 11:33:27 [name] ([id number]) -------------------------------------------------------------------------------- Wound Assessment Details Patient Name: [name], [name]. Date of Service: 12/29/2021 9:15 AM Medical Record Number: [id number] Patient Account Number: [id number] Date of Birth/Sex: October 15, 1940 (81 y.o. M) Treating RN: Cornell Barman Primary Care Kaula Klenke: Steele Sizer Other Clinician: Referring Shahiem Bedwell: Steele Sizer Treating Kalianne Fetting/Extender: Skipper Cliche in Treatment: 9 Wound Status Wound Number: 1 Primary Etiology: Pressure Ulcer Wound Location: Left Calcaneus Wound Status: Open Wounding Event: Gradually Appeared Date Acquired: 09/22/2021 Weeks Of Treatment: 9 Clustered Wound: No Wound Measurements Length: (cm) 0.7 Width: (cm) 0.5 Depth: (cm) 0.1 Area: (cm) 0.275 Volume: (cm) 0.027 % Reduction in Area: 94.2% % Reduction in Volume: 94.3% Epithelialization: Small (1-33%) Wound Description Classification: Category/Stage III Exudate Amount: Medium Exudate Type: Serosanguineous Exudate Color: red, brown Foul Odor After Cleansing: No Slough/Fibrino Yes Wound Bed Granulation Amount: Small (1-33%) Exposed Structure Granulation Quality: Red, Pink Fascia Exposed: No Necrotic Amount: Large (67-100%) Fat Layer (Subcutaneous Tissue) Exposed: Yes Necrotic Quality: Adherent Slough Tendon Exposed: No Muscle Exposed: No Joint Exposed: No Bone Exposed: No Treatment Notes Wound #1 (Calcaneus)  Wound Laterality: Left Cleanser Soap and Water Discharge Instruction: Gently cleanse wound with antibacterial soap, rinse and pat dry prior to dressing wounds Peri-Wound Care Topical Primary Dressing Silvercel Small 2x2 (in/in) Discharge Instruction: Apply Silvercel Small 2x2 (in/in) as instructed Secondary Dressing ABD Pad 5x9 (in/in) Discharge Instruction: Cover with ABD pad Secured With Medipore Tape - 91M Medipore H Soft Cloth Surgical Tape, 2x2 (in/yd) Kerlix Roll Sterile or Non-Sterile 6-ply 4.5x4 (yd/yd) Discharge Instruction: Apply Kerlix as directed WALID, HAIG ([id number]) Compression Wrap Compression Stockings Add-Ons Electronic Signature(s) Signed: 12/29/2021 4:58:49 PM By: Gretta Cool, BSN, RN, CWS, Kim RN, BSN Entered By: Gretta Cool, BSN, RN, CWS, Kim on 12/29/2021 09:52:10 [name] ([id number]) -------------------------------------------------------------------------------- Wound Assessment Details Patient Name: [name]. Date of Service: 12/29/2021 9:15 AM Medical Record Number: [id number] Patient Account Number: [id number] Date of Birth/Sex: 30-Apr-1940 (81 y.o. M) Treating RN: Cornell Barman Primary Care Willia Lampert: Steele Sizer Other Clinician: Referring Addison Freimuth: Steele Sizer Treating Newton Frutiger/Extender: Skipper Cliche in Treatment: 9 Wound Status Wound Number: 2 Primary Etiology: Pressure Ulcer Wound Location: Right Gluteus Wound Status: Open Wounding Event: Pressure Injury Date Acquired: 11/07/2021 Weeks Of Treatment: 3 Clustered Wound: No Wound Measurements Length: (cm) 1.1 Width: (cm) 1.2 Depth: (cm) 0.4 Area: (cm) 1.037 Volume: (cm) 0.415 % Reduction in Area: -10.1% % Reduction in Volume: -341.5% Epithelialization: None Tunneling: No Undermining: No Wound Description Classification: Category/Stage III Exudate Amount: Small Exudate Type: Serosanguineous Exudate Color: red, brown Foul Odor After Cleansing:  No Slough/Fibrino Yes Wound Bed Granulation Amount: None Present (0%) Exposed Structure Necrotic Amount: Large (67-100%) Fascia Exposed: No Necrotic Quality: Adherent Slough Fat Layer (Subcutaneous Tissue) Exposed: Yes Tendon Exposed: No Muscle Exposed: No Joint Exposed: No Bone Exposed: No Treatment Notes Wound #2 (Gluteus) Wound Laterality: Right Cleanser Normal Saline Discharge Instruction: Wash your hands with soap and water. Remove old dressing, discard into plastic bag and place into trash. Cleanse the wound with Normal Saline prior to applying Macias clean dressing using gauze sponges, not tissues or cotton balls. Do not scrub or use excessive force. Pat dry using gauze  sponges, not tissue or cotton balls. Soap and Water Discharge Instruction: Gently cleanse wound with antibacterial soap, rinse and pat dry prior to dressing wounds Wound Cleanser Discharge Instruction: Wash your hands with soap and water. Remove old dressing, discard into plastic bag and place into trash. Cleanse the wound with Wound Cleanser prior to applying Macias clean dressing using gauze sponges, not tissues or cotton balls. Do not scrub or use excessive force. Pat dry using gauze sponges, not tissue or cotton balls. Peri-Wound Care Topical Primary Dressing Iodosorb 40 (g) Discharge Instruction: Apply IodoSorb to wound bed only as directed. Secondary Dressing RAMZI, BRATHWAITE ([id number]) Zetuvit Plus 4x4 (in/in) Discharge Instruction: needs to be changed daily due to incontinence Secured With Compression Wrap Compression Stockings Add-Ons Electronic Signature(s) Signed: 12/29/2021 4:58:49 PM By: Gretta Cool, BSN, RN, CWS, Kim RN, BSN Entered By: Gretta Cool, BSN, RN, CWS, Kim on 12/29/2021 09:52:25 [name] ([id number]) -------------------------------------------------------------------------------- Vitals Details Patient Name: [name] Date of Service: 12/29/2021 9:15 AM Medical Record Number:  [id number] Patient Account Number: [id number] Date of Birth/Sex: 1940-09-20 (81 y.o. M) Treating RN: Cornell Barman Primary Care Jemar Paulsen: Steele Sizer Other Clinician: Referring Jaquae Rieves: Steele Sizer Treating Wilhelmine Krogstad/Extender: Skipper Cliche in Treatment: 9 Vital Signs Time Taken: 09:44 Pulse (bpm): 68 Height (in): 63 Respiratory Rate (breaths/min): 18 Weight (lbs): 135 Blood Pressure (mmHg): 124/76 Body Mass Index (BMI): 23.9 Reference Range: 80 - 120 mg / dl Electronic Signature(s) Signed: 12/29/2021 4:58:49 PM By: Gretta Cool, BSN, RN, CWS, Kim RN, BSN Entered By: Gretta Cool, BSN, RN, CWS, Kim on 12/29/2021 10:01:17

## 2021-12-29 NOTE — Progress Notes (Signed)
ANDRUW, BATTIE ([id number]) Visit Report for 12/29/2021 Chief Complaint Document Details Patient Name: [name], [name]. Date of Service: 12/29/2021 9:15 AM Medical Record Number: [id number] Patient Account Number: [id number] Date of Birth/Sex: [birth year] (81 y.o. M) Treating RN: Alycia Rossetti Primary Care Provider: Steele Sizer Other Clinician: Referring Provider: Steele Sizer Treating Provider/Extender: Skipper Cliche in Treatment: 9 Information Obtained from: Patient Chief Complaint Left heel pressure ulcer and right buttock pressure ulcer Electronic Signature(s) Signed: 12/29/2021 9:33:17 AM By: Worthy Keeler PA-C Entered By: Worthy Keeler on 12/29/2021 09:33:17 [name] ([id number]) -------------------------------------------------------------------------------- Debridement Details Patient Name: [name]. Date of Service: 12/29/2021 9:15 AM Medical Record Number: [id number] Patient Account Number: [id number] Date of Birth/Sex: Apr 25, 1940 (81 y.o. M) Treating RN: Alycia Rossetti Primary Care Provider: Steele Sizer Other Clinician: Referring Provider: Steele Sizer Treating Provider/Extender: Skipper Cliche in Treatment: 9 Debridement Performed for Wound #2 Right Gluteus Assessment: Performed By: Physician Tommie Sams., PA-C Debridement Type: Debridement Level of Consciousness (Pre- Awake and Alert procedure): Pre-procedure Verification/Time Out Yes - 10:13 Taken: Start Time: 10:13 Pain Control: Lidocaine 4% Topical Solution Total Area Debrided (L x W): 1.1 (cm) x 1.2 (cm) = 1.32 (cm) Tissue and other material Viable, Non-Viable, Slough, Subcutaneous, Biofilm, Slough debrided: Level: Skin/Subcutaneous Tissue Debridement Description: Excisional Instrument: Curette Bleeding: Minimum Hemostasis Achieved: Pressure End Time: 10:15 Response to Treatment: Procedure was tolerated well Level of Consciousness (Post- Awake and  Alert procedure): Post Debridement Measurements of Total Wound Length: (cm) 1.1 Stage: Category/Stage III Width: (cm) 1.2 Depth: (cm) 0.5 Volume: (cm) 0.518 Character of Wound/Ulcer Post Debridement: Improved Post Procedure Diagnosis Same as Pre-procedure Electronic Signature(s) Unsigned Entered By: Worthy Keeler on 12/29/2021 10:18:53 Signature(s): Date(s): [name] ([id number]) -------------------------------------------------------------------------------- Physician Orders Details Patient Name: [name]. Date of Service: 12/29/2021 9:15 AM Medical Record Number: [id number] Patient Account Number: [id number] Date of Birth/Sex: 16-May-1940 (81 y.o. M) Treating RN: Alycia Rossetti Primary Care Provider: Steele Sizer Other Clinician: Referring Provider: Steele Sizer Treating Provider/Extender: Skipper Cliche in Treatment: 9 Verbal / Phone Orders: No Diagnosis Coding ICD-10 Coding Code Description [phone] Pressure ulcer of left heel, stage 3 L89.310 Pressure ulcer of right buttock, unstageable G80.8 Other cerebral palsy I73.89 Other specified peripheral vascular diseases I87.393 Chronic venous hypertension (idiopathic) with other complications of bilateral lower extremity Follow-up Appointments o Return Appointment in 3 weeks. Bathing/ Shower/ Hygiene o May shower; gently cleanse wound with antibacterial soap, rinse and pat dry prior to dressing wounds Anesthetic (Use 'Patient Medications' Section for Anesthetic Order Entry) o Lidocaine applied to wound bed Non-Wound Condition o Additional non-wound orders/instructions: - clean buttocks with soap and water , pat dry , apply calmoseptine ointment after each incontent episode Off-Loading o Roho cushion for wheelchair o Hospital bed/mattress o Gel mattress overlay (Group 1) o Other: - prevalon boots to bi lat feet / legs at all times to protect heels Additional Orders /  Instructions o Other: - zinc oxide or destin to be used for skin breakdown in gluteal area Wound Treatment Wound #1 - Calcaneus Wound Laterality: Left Cleanser: Soap and Water 3 x Per Week/30 Days Discharge Instructions: Gently cleanse wound with antibacterial soap, rinse and pat dry prior to dressing wounds Primary Dressing: Silvercel Small 2x2 (in/in) (DME) (Generic) 3 x Per Week/30 Days Discharge Instructions: Apply Silvercel Small 2x2 (in/in) as instructed Secondary Dressing: ABD Pad 5x9 (in/in) (DME) (Generic) 3 x Per Week/30 Days Discharge Instructions: Cover with ABD pad Secured With: Medipore Tape -  73M Medipore H Soft Cloth Surgical Tape, 2x2 (in/yd) (DME) (Generic) 3 x Per Week/30 Days Secured With: Kerlix Roll Sterile or Non-Sterile 6-ply 4.5x4 (yd/yd) (DME) (Generic) 3 x Per Week/30 Days Discharge Instructions: Apply Kerlix as directed Wound #2 - Gluteus Wound Laterality: Right Cleanser: Normal Saline 3 x Per Week/30 Days Discharge Instructions: Wash your hands with soap and water. Remove old dressing, discard into plastic bag and place into trash. Cleanse the wound with Normal Saline prior to applying a clean dressing using gauze sponges, not tissues or cotton balls. Do not scrub or use excessive force. Pat dry using gauze sponges, not tissue or cotton balls. Cleanser: Soap and Water 3 x Per Week/30 Days DARTANIAN, KNAGGS ([id number]) Discharge Instructions: Gently cleanse wound with antibacterial soap, rinse and pat dry prior to dressing wounds Cleanser: Wound Cleanser 3 x Per Week/30 Days Discharge Instructions: Wash your hands with soap and water. Remove old dressing, discard into plastic bag and place into trash. Cleanse the wound with Wound Cleanser prior to applying a clean dressing using gauze sponges, not tissues or cotton balls. Do not scrub or use excessive force. Pat dry using gauze sponges, not tissue or cotton balls. Primary Dressing: Iodosorb 40 (g) (Generic) 3 x  Per Week/30 Days Discharge Instructions: Apply IodoSorb to wound bed only as directed. Secondary Dressing: Zetuvit Plus 4x4 (in/in) (Generic) 3 x Per Week/30 Days Discharge Instructions: needs to be changed daily due to incontinence Electronic Signature(s) Unsigned Entered By: Gretta Cool, BSN, RN, CWS, Kim on 12/29/2021 11:34:59 Signature(s): Date(s): [name] ([id number]) -------------------------------------------------------------------------------- Problem List Details Patient Name: ZIMIR, KITTLESON A. Date of Service: 12/29/2021 9:15 AM Medical Record Number: [id number] Patient Account Number: [id number] Date of Birth/Sex: 1940/12/06 (81 y.o. M) Treating RN: Alycia Rossetti Primary Care Provider: Steele Sizer Other Clinician: Referring Provider: Steele Sizer Treating Provider/Extender: Skipper Cliche in Treatment: 9 Active Problems ICD-10 Encounter Code Description Active Date MDM Diagnosis [phone] Pressure ulcer of left heel, stage 3 10/27/2021 No Yes L89.310 Pressure ulcer of right buttock, unstageable 12/08/2021 No Yes G80.8 Other cerebral palsy 10/27/2021 No Yes I73.89 Other specified peripheral vascular diseases 10/27/2021 No Yes I87.393 Chronic venous hypertension (idiopathic) with other complications of 11/29/4164 No Yes bilateral lower extremity Inactive Problems Resolved Problems Electronic Signature(s) Signed: 12/29/2021 9:32:58 AM By: Worthy Keeler PA-C Entered By: Worthy Keeler on 12/29/2021 09:32:57

## 2022-01-04 NOTE — Progress Notes (Unsigned)
01/05/2022 4:07 PM   [name] 01-26-1941 [id number]  Referring provider: Steele Sizer, MD [address] [place],  [place] [address]  Urological history: 1.  History T1c intermediate risk prostate cancer -Gleason 3+4 diagnosed 2010 -Treated with brachytherapy 11/10/2008 -Last PSA 10/2015 <0.1   2.  Chronic bacteriuria -Cystoscopy Dr. Elnoria Howard 2015 unremarkable, nonocclusive prostate or stricture   3.  Stone disease - CT 2019 nonobstructing right mid ureteral calculus -Ureteroscopic stone removal 08/14/2016 -Left URS (10/19/2021)    4.  Renal cysts -CTU 2019 bilateral renal cysts with 2 indeterminate right renal lesions (18 mm interpolar left kidney and 15 mm anterior right kidney) -MRI 07/2017 showed the left renal lesion was not present and was a perfusion anomaly; right renal lesion a Bosniak 2 cyst -CTU 2022 - There is 1.9 cm smooth marginated low-density lesion in the midportion of left kidney. There is 2.2 cm cyst in the anterior midportion of right kidney. There is 2.3 cm exophytic lesion in the anterior midportion of right kidney, possibly cyst   HPI: [name] is a 81 y.o. male who presents today for possible stone.   His caregiver, Hassan Rowan, is with him today and states that he has been complaining of intense right-sided pain on 2 occasions over the last 2 weeks.  She states she is also noted a very cloudy urine.  Patient denies any modifying or aggravating factors.  Patient denies any gross hematuria, dysuria or suprapubic/flank pain.  Patient denies any fevers, chills, nausea or vomiting.    KUB (01/05/2022) - 2.5 cm right renal pelvic stone is noted.  Large volume of stools noted.  CATH UA nitrite positive, greater than 30 WBCs, greater than 30 RBCs and many bacteria.  PMH: Past Medical History:  Diagnosis Date   Anxiety    Atrophy, disuse, muscle    BPH (benign prostatic hyperplasia)    Cerebral palsy (HCC)    Eczema    Elevated lipids     Elevated PSA    Exotropia    History of kidney stones    Hyperlipidemia    Kidney stone    Leukopenia    Lower extremity edema    lymphedema   Mental retardation    Over weight    Prostate cancer ([place])    Venous insufficiency    Vitamin D deficiency    Wheelchair dependence     Surgical History: Past Surgical History:  Procedure Laterality Date   COLONOSCOPY WITH PROPOFOL N/A 05/10/2020   Procedure: COLONOSCOPY WITH PROPOFOL;  Surgeon: Lin Landsman, MD;  Location: [facility] ENDOSCOPY;  Service: Gastroenterology;  Laterality: N/A;   COLONOSCOPY WITH PROPOFOL N/A 05/12/2020   Procedure: COLONOSCOPY WITH PROPOFOL;  Surgeon: Lin Landsman, MD;  Location: [facility] ENDOSCOPY;  Service: Gastroenterology;  Laterality: N/A;   CYSTOSCOPY W/ RETROGRADES Left 10/04/2021   Procedure: CYSTOSCOPY WITH RETROGRADE PYELOGRAM;  Surgeon: Abbie Sons, MD;  Location: [facility] ORS;  Service: Urology;  Laterality: Left;   CYSTOSCOPY/URETEROSCOPY/HOLMIUM LASER/STENT PLACEMENT Right 08/14/2017   Procedure: CYSTOSCOPY/URETEROSCOPY/HOLMIUM LASER/STENT PLACEMENT;  Surgeon: Abbie Sons, MD;  Location: [facility] ORS;  Service: Urology;  Laterality: Right;   CYSTOSCOPY/URETEROSCOPY/HOLMIUM LASER/STENT PLACEMENT Left 10/04/2021   Procedure: CYSTOSCOPY/URETEROSCOPY/HOLMIUM LASER/STENT PLACEMENT/ LITHOPAXY;  Surgeon: Abbie Sons, MD;  Location: [facility] ORS;  Service: Urology;  Laterality: Left;   ESOPHAGOGASTRODUODENOSCOPY (EGD) WITH PROPOFOL N/A 05/12/2020   Procedure: ESOPHAGOGASTRODUODENOSCOPY (EGD) WITH PROPOFOL;  Surgeon: Lin Landsman, MD;  Location: [facility] ENDOSCOPY;  Service: Gastroenterology;  Laterality:  N/A;   INCISION AND DRAINAGE ABSCESS Left 07/05/2021   Procedure: INCISION AND DRAINAGE SCROTAL WALL ABSCESS;  Surgeon: Abbie Sons, MD;  Location: [facility] ORS;  Service: Urology;  Laterality: Left;   leg circulation surgery Right    PROSTATE SURGERY  11/10/2008   BRACHYTHERAPY   SCROTAL  EXPLORATION Left 07/05/2021   Procedure: SCROTUM EXPLORATION;  Surgeon: Abbie Sons, MD;  Location: [facility] ORS;  Service: Urology;  Laterality: Left;    Home Medications:  Allergies as of 01/05/2022   No Known Allergies      Medication List        Accurate as of January 05, 2022 11:59 PM. If you have any questions, ask your nurse or doctor.          alfuzosin 10 MG 24 hr tablet Commonly known as: UROXATRAL TAKE 1 TABLET BY MOUTH DAILY   ALPRAZolam 0.25 MG tablet Commonly known as: XANAX Take 1 tablet (0.25 mg total) by mouth daily as needed (agitation (crisis med)).   Artificial Tears 0.2-0.2-1 % Soln Generic drug: Glycerin-Hypromellose-PEG 400 PLACE 2 DROPS INTO EYE 2 TIMES A DAY AS NEEDED   atorvastatin 40 MG tablet Commonly known as: LIPITOR TAKE 1 TABLET BY MOUTH EVERY EVENING   augmented betamethasone dipropionate 0.05 % cream Commonly known as: DIPROLENE-AF APPLY TOPICALLY AS DIRECTED TO RASH AS NEEDED   Calcium Carb-Cholecalciferol 500-10 MG-MCG Tabs TAKE 1 TABLET BY MOUTH 2 TIMES A DAY   ciprofloxacin 250 MG tablet Commonly known as: Cipro Take 1 tablet (250 mg total) by mouth 2 (two) times daily.   Cranberry Juice Powder 425 MG Caps TAKE 1 CAPSULE BY MOUTH ONCE DAILY   Depend Silhouette Briefs L/XL Misc 1 each by Does not apply route 2 (two) times daily as needed.   Ensure Original Liqd Take 1 Can by mouth 3 (three) times daily.   GNP Vitamin C 500 MG tablet Generic drug: ascorbic acid TAKE ONE TABLET BY MOUTH ONCE DAILY   Iron (Ferrous Sulfate) 325 (65 Fe) MG Tabs Take 325 mg by mouth daily.   lubiprostone 24 MCG capsule Commonly known as: AMITIZA TAKE 1 CAPSULE BY MOUTH 2 TIMES DAILY WITH A MEAL   Medical Compression Stockings Misc 1 Units by Does not apply route daily.   nystatin cream Commonly known as: MYCOSTATIN APPLY 1 APPLICATION TOPICALLY 2 TIMES DAILY   sertraline 100 MG tablet Commonly known as: ZOLOFT TAKE 1 TABLET BY  MOUTH DAILY   SYSTANE ULTRA OP Place 1 drop into both eyes 2 (two) times daily as needed (dry eyes).        Allergies: No Known Allergies  Family History: Family History  Problem Relation Age of Onset   Heart attack Brother    Heart disease Other     Social History:  reports that he has never smoked. He has never used smokeless tobacco. He reports that he does not drink alcohol and does not use drugs.  ROS: Pertinent ROS in HPI  Physical Exam: BP (!) 112/59   Pulse (!) 53   Constitutional:  Well nourished. Alert and oriented, No acute distress. HEENT: [place] AT, moist mucus membranes.  Trachea midline Cardiovascular: No clubbing, cyanosis, or edema. Respiratory: Normal respiratory effort, no increased work of breathing. GU: No CVA tenderness.  No bladder fullness or masses.  Patient with uncircumcised phallus.  Foreskin easily retracted  Urethral meatus is patent.  No penile discharge. No penile lesions or rashes. Scrotum without lesions, cysts, rashes and/or edema.  Neurologic: Grossly intact, no focal deficits, moving all 4 extremities. Psychiatric: Normal mood and affect.   Laboratory Data: Component     Latest Ref Rng 01/05/2022  Color, UA     Yellow  Yellow   Bilirubin, UA     Negative  Negative   Ketones, UA     Negative  Negative   Specific Gravity, UA     1.005 - 1.030  1.020   RBC, UA     Negative  2+ !   pH, UA     5.0 - 7.5  5.5   Protein,UA     Negative/Trace  2+ !   Nitrite, UA     Negative  Positive !   Leukocytes,UA     Negative  1+ !   Glucose, UA     Negative  Negative   Appearance Ur     Clear  Cloudy !   Urobilinogen, Ur     0.2 - 1.0 mg/dL 1.0   Microscopic Examination See below:     Legend: ! Abnormal  Component     Latest Ref Rng 01/05/2022  WBC, UA     0 - 5 /hpf >30 !   Bacteria, UA     None seen/Few  Many !   Epithelial Cells (non renal)     0 - 10 /hpf 0-10   RBC, Urine     0 - 2 /hpf >30 !     Legend: ! Abnormal I  have reviewed the labs.   Procedure In and Out Catheterization Patient was cleaned and prepped in a sterile fashion with betadine . A 14 FR cath was inserted no complications were noted , 70 ml of urine return was noted, urine was yellow cloudy in color. A clean urine sample was collected for urinalysis and urine culture. Bladder was drained  And catheter was removed with out difficulty.    Performed by: Zara Council, PA-C and Evelina Bucy, CMA and Kyra Manges, CMA  Pertinent Imaging: KUB (01/05/2022) large right renal pelvic stone I have independently reviewed the films.  See HPI.  Radiologist interpretation still pending  Assessment & Plan:    1. Right UPJ stone -due to patient's contractures, ureteroscopy was found to be challenging when addressing his left proximal stone in June -CT renal stone study ordered to assist w/ defining stone burden and distribution, and to evaluate collecting system anatomy, position of peri-renal structures and relevant anatomic variants -he will return for report w/ Dr. Erlene Quan for further discussion regarding management and treatment of his stones -CATH UA grossly infected -urine culture -started on Cipro 250 mg BID -reviewed red flag signs and advised to seek treatment in the ED or call office immediately  -These notes generated with voice recognition software. I apologize for typographical errors.  [place], [place] [address]  [place], [place] [address] [phone]

## 2022-01-05 ENCOUNTER — Ambulatory Visit
Admission: RE | Admit: 2022-01-05 | Discharge: 2022-01-05 | Disposition: A | Discharge status: Home / Self Care | Payer: Medicare Other | Attending: Urology | Admitting: Urology

## 2022-01-05 ENCOUNTER — Ambulatory Visit
Admission: RE | Admit: 2022-01-05 | Discharge: 2022-01-05 | Disposition: A | Discharge status: Home / Self Care | Payer: Medicare Other | Source: Ambulatory Visit | Attending: Urology | Admitting: Urology

## 2022-01-05 ENCOUNTER — Ambulatory Visit (INDEPENDENT_AMBULATORY_CARE_PROVIDER_SITE_OTHER): Payer: Medicare Other | Admitting: Urology

## 2022-01-05 VITALS — BP 112/59 | HR 53

## 2022-01-05 DIAGNOSIS — R3989 Other symptoms and signs involving the genitourinary system: Secondary | ICD-10-CM

## 2022-01-05 DIAGNOSIS — N200 Calculus of kidney: Secondary | ICD-10-CM

## 2022-01-05 DIAGNOSIS — N201 Calculus of ureter: Secondary | ICD-10-CM | POA: Insufficient documentation

## 2022-01-05 MED ORDER — CIPROFLOXACIN HCL 250 MG PO TABS: 250.0000 mg | ORAL_TABLET | Freq: Two times a day (BID) | ORAL | 0 refills | Status: DC

## 2022-01-06 ENCOUNTER — Ambulatory Visit
Admission: RE | Admit: 2022-01-06 | Discharge: 2022-01-06 | Disposition: A | Discharge status: Home / Self Care | Payer: Medicare Other | Source: Ambulatory Visit | Attending: Urology | Admitting: Urology

## 2022-01-06 ENCOUNTER — Encounter: Payer: Self-pay | Admitting: Urology

## 2022-01-06 DIAGNOSIS — N200 Calculus of kidney: Secondary | ICD-10-CM | POA: Diagnosis present

## 2022-01-06 LAB — URINALYSIS, COMPLETE
Bilirubin, UA: NEGATIVE
Glucose, UA: NEGATIVE
Ketones, UA: NEGATIVE
Nitrite, UA: POSITIVE — AB
Specific Gravity, UA: 1.02 (ref 1.005–1.030)
Urobilinogen, Ur: 1 mg/dL (ref 0.2–1.0)
pH, UA: 5.5 (ref 5.0–7.5)

## 2022-01-06 LAB — MICROSCOPIC EXAMINATION
RBC, Urine: >30 /hpf — AB (ref 0–2)
WBC, UA: >30 /hpf — AB (ref 0–5)

## 2022-01-09 ENCOUNTER — Telehealth: Payer: Self-pay

## 2022-01-09 LAB — CULTURE, URINE COMPREHENSIVE

## 2022-01-09 NOTE — Telephone Encounter (Signed)
Notified pts caregiver as instructed, she expressed understanding.

## 2022-01-09 NOTE — Telephone Encounter (Signed)
-----   Message from Nori Riis, PA-C sent at 01/09/2022  4:40 PM EDT ----- Please let Bill's caregivers know that the urine culture was positive for infection and the Cipro that I had prescribed is the appropriate antibiotic.

## 2022-01-11 ENCOUNTER — Encounter: Payer: Self-pay | Admitting: Urology

## 2022-01-11 ENCOUNTER — Ambulatory Visit (INDEPENDENT_AMBULATORY_CARE_PROVIDER_SITE_OTHER): Payer: Medicare Other | Admitting: Urology

## 2022-01-11 VITALS — Ht 73.0 in

## 2022-01-11 DIAGNOSIS — N200 Calculus of kidney: Secondary | ICD-10-CM | POA: Diagnosis not present

## 2022-01-11 NOTE — Progress Notes (Signed)
01/11/2022 7:56 AM   [name] 1940/05/23 [id number]  Referring provider: Steele Sizer, MD [address] [place],  [place] [address]    HPI: Extremely pleasant 81 year old male with a personal history of CP, lower extremity contractures and recurrent nephrolithiasis who presents today for further discussion of consideration of PCNL.  Notably today, he is accompanied by his legal guardian, his caretaker, and another family member all of whom know him quite well.  He is a Water engineer recurrent stone disease and is undergone multiple successful ureteroscopy's in the past.  Upon further discussion with Dr. Bernardo Heater, at the last attempted procedure, his lower extremity contractures had progressed such that he was unable to be placed in lithotomy and there is limited access to his urethra and bladder in a retrograde fashion.  This was not successful.  He does have a right renal pelvic stone which appears to be nonobstructing measuring up to 2.2 cm consistent with medically active stone disease.  This is stable from 09/2021.  His caretaker reports today that he does shout out in pain occasionally and says that his right flank hurts.  When this happens, he often passes a small fragment.  He does have recurrent infections, most recent Pseudomonas on Cipro.  We discussed at length today his contractures.  He is no longer working with physical therapy and over time, has lost mobility in his lower extremities.  When he lies supine, he is not able to get his legs flat are separated.  This seems to be progressing.  He never lies prone.  His caretaker does not think he be able to be positioned in that manner.    PMH: Past Medical History:  Diagnosis Date   Anxiety    Atrophy, disuse, muscle    BPH (benign prostatic hyperplasia)    Cerebral palsy (HCC)    Eczema    Elevated lipids    Elevated PSA    Exotropia    History of kidney stones    Hyperlipidemia    Kidney stone     Leukopenia    Lower extremity edema    lymphedema   Mental retardation    Over weight    Prostate cancer ([place])    Venous insufficiency    Vitamin D deficiency    Wheelchair dependence     Surgical History: Past Surgical History:  Procedure Laterality Date   COLONOSCOPY WITH PROPOFOL N/A 05/10/2020   Procedure: COLONOSCOPY WITH PROPOFOL;  Surgeon: Lin Landsman, MD;  Location: [facility] ENDOSCOPY;  Service: Gastroenterology;  Laterality: N/A;   COLONOSCOPY WITH PROPOFOL N/A 05/12/2020   Procedure: COLONOSCOPY WITH PROPOFOL;  Surgeon: Lin Landsman, MD;  Location: [facility] ENDOSCOPY;  Service: Gastroenterology;  Laterality: N/A;   CYSTOSCOPY W/ RETROGRADES Left 10/04/2021   Procedure: CYSTOSCOPY WITH RETROGRADE PYELOGRAM;  Surgeon: Abbie Sons, MD;  Location: [facility] ORS;  Service: Urology;  Laterality: Left;   CYSTOSCOPY/URETEROSCOPY/HOLMIUM LASER/STENT PLACEMENT Right 08/14/2017   Procedure: CYSTOSCOPY/URETEROSCOPY/HOLMIUM LASER/STENT PLACEMENT;  Surgeon: Abbie Sons, MD;  Location: [facility] ORS;  Service: Urology;  Laterality: Right;   CYSTOSCOPY/URETEROSCOPY/HOLMIUM LASER/STENT PLACEMENT Left 10/04/2021   Procedure: CYSTOSCOPY/URETEROSCOPY/HOLMIUM LASER/STENT PLACEMENT/ LITHOPAXY;  Surgeon: Abbie Sons, MD;  Location: [facility] ORS;  Service: Urology;  Laterality: Left;   ESOPHAGOGASTRODUODENOSCOPY (EGD) WITH PROPOFOL N/A 05/12/2020   Procedure: ESOPHAGOGASTRODUODENOSCOPY (EGD) WITH PROPOFOL;  Surgeon: Lin Landsman, MD;  Location: [facility] ENDOSCOPY;  Service: Gastroenterology;  Laterality: N/A;   INCISION AND DRAINAGE ABSCESS Left 07/05/2021   Procedure: INCISION  AND DRAINAGE SCROTAL WALL ABSCESS;  Surgeon: Abbie Sons, MD;  Location: [facility] ORS;  Service: Urology;  Laterality: Left;   leg circulation surgery Right    PROSTATE SURGERY  11/10/2008   BRACHYTHERAPY   SCROTAL EXPLORATION Left 07/05/2021   Procedure: SCROTUM EXPLORATION;  Surgeon: Abbie Sons, MD;   Location: [facility] ORS;  Service: Urology;  Laterality: Left;    Home Medications:  Allergies as of 01/11/2022   No Known Allergies      Medication List        Accurate as of January 11, 2022 11:59 PM. If you have any questions, ask your nurse or doctor.          alfuzosin 10 MG 24 hr tablet Commonly known as: UROXATRAL TAKE 1 TABLET BY MOUTH DAILY   ALPRAZolam 0.25 MG tablet Commonly known as: XANAX Take 1 tablet (0.25 mg total) by mouth daily as needed (agitation (crisis med)).   Artificial Tears 0.2-0.2-1 % Soln Generic drug: Glycerin-Hypromellose-PEG 400 PLACE 2 DROPS INTO EYE 2 TIMES A DAY AS NEEDED   atorvastatin 40 MG tablet Commonly known as: LIPITOR TAKE 1 TABLET BY MOUTH EVERY EVENING   augmented betamethasone dipropionate 0.05 % cream Commonly known as: DIPROLENE-AF APPLY TOPICALLY AS DIRECTED TO RASH AS NEEDED   Calcium Carb-Cholecalciferol 500-10 MG-MCG Tabs TAKE 1 TABLET BY MOUTH 2 TIMES A DAY   ciprofloxacin 250 MG tablet Commonly known as: Cipro Take 1 tablet (250 mg total) by mouth 2 (two) times daily.   Cranberry Juice Powder 425 MG Caps TAKE 1 CAPSULE BY MOUTH ONCE DAILY   Depend Silhouette Briefs L/XL Misc 1 each by Does not apply route 2 (two) times daily as needed.   Ensure Original Liqd Take 1 Can by mouth 3 (three) times daily.   GNP Vitamin C 500 MG tablet Generic drug: ascorbic acid TAKE ONE TABLET BY MOUTH ONCE DAILY   Iron (Ferrous Sulfate) 325 (65 Fe) MG Tabs Take 325 mg by mouth daily.   lubiprostone 24 MCG capsule Commonly known as: AMITIZA TAKE 1 CAPSULE BY MOUTH 2 TIMES DAILY WITH A MEAL   Medical Compression Stockings Misc 1 Units by Does not apply route daily.   nystatin cream Commonly known as: MYCOSTATIN APPLY 1 APPLICATION TOPICALLY 2 TIMES DAILY   sertraline 100 MG tablet Commonly known as: ZOLOFT TAKE 1 TABLET BY MOUTH DAILY   SYSTANE ULTRA OP Place 1 drop into both eyes 2 (two) times daily as needed  (dry eyes).        Allergies: No Known Allergies  Family History: Family History  Problem Relation Age of Onset   Heart attack Brother    Heart disease Other     Social History:  reports that he has never smoked. He has never used smokeless tobacco. He reports that he does not drink alcohol and does not use drugs.   Physical Exam: Ht '6\' 1"'$  (1.854 m)   BMI 17.15 kg/m   Constitutional:  Alert and oriented, No acute distress.  In a wheelchair.  Answers most questions appropriately although difficult at times to understand.  In wheelchair.  Significant contracture disease. Neurologic: Grossly intact, no focal deficits, moving all 4 extremities. Psychiatric: Normal mood and affect.  Laboratory Data: Lab Results  Component Value Date   WBC 4.3 09/22/2021   HGB 12.6 (L) 09/22/2021   HCT 37.1 (L) 09/22/2021   MCV 96.4 09/22/2021   PLT 150 09/22/2021    Lab Results  Component Value Date  CREATININE 0.60 (L) 09/22/2021    Lab Results  Component Value Date   HGBA1C 4.9 11/30/2017    CT RENAL STONE STUDY  Narrative CLINICAL DATA:  Flank pain, concern for kidney stones.  EXAM: CT ABDOMEN AND PELVIS WITHOUT CONTRAST  TECHNIQUE: Multidetector CT imaging of the abdomen and pelvis was performed following the standard protocol without IV contrast.  RADIATION DOSE REDUCTION: This exam was performed according to the departmental dose-optimization program which includes automated exposure control, adjustment of the mA and/or kV according to patient size and/or use of iterative reconstruction technique.  COMPARISON:  CT abdomen pelvis dated 09/22/2021.  FINDINGS: Lower chest: There is mild bibasilar atelectasis.  Hepatobiliary: No focal liver abnormality is seen. No gallstones, gallbladder wall thickening, or biliary dilatation.  Pancreas: Unremarkable. No pancreatic ductal dilatation or surrounding inflammatory changes.  Spleen: Normal in size without focal  abnormality.  Adrenals/Urinary Tract: Adrenal glands are unremarkable. Bilateral renal cysts measure up to 2.2 cm on the right and 2.4 cm on the left. No further imaging is recommended for these findings. Multiple nonobstructive right renal calculi are noted with the largest measuring 2.2 x 1.6 cm in the right renal pelvis. These calculi appear similar to prior exam dated 09/22/2021. No renal calculi are identified on the left. No hydronephrosis on either side. The urinary bladder wall is thickened and trabeculated, likely representing chronic bladder outlet obstruction.  Stomach/Bowel: Stomach is within normal limits. There is a moderate amount of stool in the rectum. No pericecal inflammatory changes are noted to suggest acute appendicitis. No evidence of bowel wall thickening, distention, or inflammatory changes.  Vascular/Lymphatic: Aortic atherosclerosis. No enlarged abdominal or pelvic lymph nodes.  Reproductive: Bilateral hydroceles in the scrotum are partially imaged. Metallic seeds are seen in the prostate.  Other: No abdominal wall hernia or abnormality. No abdominopelvic ascites.  Musculoskeletal: There is diffuse osseous demineralization and degenerative changes in the spine. Anterolisthesis of L5 on S1 measures 9 mm and is unchanged.  IMPRESSION: 1. Multiple nonobstructive renal calculi on the right measure up to 2.2 x 1.6 cm and are similar to 09/22/2021. No renal calculi on the left. No hydronephrosis on either side. 2. Urinary bladder wall thickening may reflect chronic bladder outlet obstruction or cystitis. 3. Moderate stool burden in the rectum.  Aortic Atherosclerosis (ICD10-I70.0).   Electronically Signed By: Zerita Boers M.D. On: 01/07/2022 12:13  CT above was personally reviewed.  Agree with radiologic interpretation.  Large right-sided stone burden although nonobstructing.   Assessment & Plan:    1. Nephrolithiasis Large nonobstructing  right-sided stone burden status post failed ureteroscopy  We had a lengthy and honest discussion today about alternative treatment options for his right-sided stone burden.  Suspect his intermittent right flank is from passing smaller stones treated for this much larger stone burden.  We discussed PCNL at length.  We discussed the positioning requirements along with the risk and benefits including bleeding, infection, and sepsis in setting of probable bacterial colonization or infectious stones.  Specifically today, we discussed my concern about positioning given the degree of his contractures.  With the degree of his contractures currently, I do not feel that it is likely possible.  It may be able to be performed in a lateral decubitus position but have limited experience with this.  He was offered consideration of referral to tertiary care center for second opinion.  Lastly, we discussed another attempt at ureteroscopy.  We discussed the option of getting him reenrolled in physical therapy to  help with his hip mobility and flexibility potentially to facilitate ureteroscopy which has been successful in the past.  We will discuss this further with her primary care physician about getting him reinvolved in this.  At this time,  strongly recommend observation if they do not elect to pursue either of the above.  We will follow-up in 3 months with renal ultrasound prior to ensure that the stones do not become obstructive. - US RENAL; Future    Return in about 3 months (around 04/12/2022) for Renal ultrasound follow up with shannon .  Hollice Espy, MD  [facility] [address], [place], [place] [address] [phone]  I spent 42 total minutes on the day of the encounter including pre-visit review of the medical record, face-to-face time with the patient, and post visit ordering of labs/imaging/tests.

## 2022-01-19 ENCOUNTER — Encounter: Payer: Medicare Other | Admitting: Physician Assistant

## 2022-01-19 ENCOUNTER — Ambulatory Visit: Payer: Medicare Other | Admitting: Urology

## 2022-01-19 DIAGNOSIS — L89623 Pressure ulcer of left heel, stage 3: Secondary | ICD-10-CM | POA: Diagnosis not present

## 2022-01-19 NOTE — Progress Notes (Addendum)
CODA, MATHEY ([id number]) Visit Report for 01/19/2022 Arrival Information Details Patient Name: [name], [name]. Date of Service: 01/19/2022 11:00 AM Medical Record Number: [id number] Patient Account Number: [id number] Date of Birth/Sex: 01/08/1941 (81 y.o. M) Treating RN: Carlene Coria Primary Care Shantal Roan: Steele Sizer Other Clinician: Referring Maizee Reinhold: Steele Sizer Treating Eloy Fehl/Extender: Skipper Cliche in Treatment: 12 Visit Information History Since Last Visit All ordered tests and consults were completed: No Patient Arrived: Wheel Chair Added or deleted any medications: No Arrival Time: 10:50 Any new allergies or adverse reactions: No Accompanied By: caregiver Had a fall or experienced change in No Transfer Assistance: None activities of daily living that may affect Patient Identification Verified: Yes risk of falls: Secondary Verification Process Completed: Yes Signs or symptoms of abuse/neglect since last visito No Patient Requires Transmission-Based Precautions: No Hospitalized since last visit: No Patient Has Alerts: No Implantable device outside of the clinic excluding No cellular tissue based products placed in the center since last visit: Has Dressing in Place as Prescribed: Yes Pain Present Now: No Electronic Signature(s) Signed: 01/19/2022 11:15:15 AM By: Carlene Coria RN Entered By: Carlene Coria on 01/19/2022 11:15:15 [name] ([id number]) -------------------------------------------------------------------------------- Clinic Level of Care Assessment Details Patient Name: [name]. Date of Service: 01/19/2022 11:00 AM Medical Record Number: [id number] Patient Account Number: [id number] Date of Birth/Sex: 1940-12-27 (81 y.o. M) Treating RN: Carlene Coria Primary Care Mishawn Didion: Steele Sizer Other Clinician: Referring Jeren Dufrane: Steele Sizer Treating Jannel Lynne/Extender: Skipper Cliche in Treatment: 12 Clinic Level  of Care Assessment Items TOOL 4 Quantity Score X - Use when only an EandM is performed on FOLLOW-UP visit 1 0 ASSESSMENTS - Nursing Assessment / Reassessment X - Reassessment of Co-morbidities (includes updates in patient status) 1 10 X- 1 5 Reassessment of Adherence to Treatment Plan ASSESSMENTS - Wound and Skin Assessment / Reassessment '[]'$  - Simple Wound Assessment / Reassessment - one wound 0 X- 2 5 Complex Wound Assessment / Reassessment - multiple wounds '[]'$  - 0 Dermatologic / Skin Assessment (not related to wound area) ASSESSMENTS - Focused Assessment '[]'$  - Circumferential Edema Measurements - multi extremities 0 '[]'$  - 0 Nutritional Assessment / Counseling / Intervention '[]'$  - 0 Lower Extremity Assessment (monofilament, tuning fork, pulses) '[]'$  - 0 Peripheral Arterial Disease Assessment (using hand held doppler) ASSESSMENTS - Ostomy and/or Continence Assessment and Care '[]'$  - Incontinence Assessment and Management 0 '[]'$  - 0 Ostomy Care Assessment and Management (repouching, etc.) PROCESS - Coordination of Care X - Simple Patient / Family Education for ongoing care 1 15 '[]'$  - 0 Complex (extensive) Patient / Family Education for ongoing care '[]'$  - 0 Staff obtains Programmer, systems, Records, Test Results / Process Orders '[]'$  - 0 Staff telephones HHA, Nursing Homes / Clarify orders / etc '[]'$  - 0 Routine Transfer to another Facility (non-emergent condition) '[]'$  - 0 Routine Hospital Admission (non-emergent condition) '[]'$  - 0 New Admissions / Biomedical engineer / Ordering NPWT, Apligraf, etc. '[]'$  - 0 Emergency Hospital Admission (emergent condition) X- 1 10 Simple Discharge Coordination '[]'$  - 0 Complex (extensive) Discharge Coordination PROCESS - Special Needs '[]'$  - Pediatric / Minor Patient Management 0 '[]'$  - 0 Isolation Patient Management '[]'$  - 0 Hearing / Language / Visual special needs '[]'$  - 0 Assessment of Community assistance (transportation, D/C planning, etc.) '[]'$  - 0 Additional  assistance / Altered mentation '[]'$  - 0 Support Surface(s) Assessment (bed, cushion, seat, etc.) INTERVENTIONS - Wound Cleansing / Measurement MITCH, ARQUETTE ([id number]) '[]'$  - 0 Simple Wound Cleansing -  one wound X- 2 5 Complex Wound Cleansing - multiple wounds X- 1 5 Wound Imaging (photographs - any number of wounds) '[]'$  - 0 Wound Tracing (instead of photographs) '[]'$  - 0 Simple Wound Measurement - one wound X- 2 5 Complex Wound Measurement - multiple wounds INTERVENTIONS - Wound Dressings X - Small Wound Dressing one or multiple wounds 2 10 '[]'$  - 0 Medium Wound Dressing one or multiple wounds '[]'$  - 0 Large Wound Dressing one or multiple wounds '[]'$  - 0 Application of Medications - topical '[]'$  - 0 Application of Medications - injection INTERVENTIONS - Miscellaneous '[]'$  - External ear exam 0 '[]'$  - 0 Specimen Collection (cultures, biopsies, blood, body fluids, etc.) '[]'$  - 0 Specimen(s) / Culture(s) sent or taken to Lab for analysis '[]'$  - 0 Patient Transfer (multiple staff / Civil Service fast streamer / Similar devices) '[]'$  - 0 Simple Staple / Suture removal (25 or less) '[]'$  - 0 Complex Staple / Suture removal (26 or more) '[]'$  - 0 Hypo / Hyperglycemic Management (close monitor of Blood Glucose) '[]'$  - 0 Ankle / Brachial Index (ABI) - do not check if billed separately X- 1 5 Vital Signs Has the patient been seen at the hospital within the last three years: Yes Total Score: 100 Level Of Care: New/Established - Level 3 Electronic Signature(s) Signed: 01/19/2022 12:48:30 PM By: Carlene Coria RN Entered By: Carlene Coria on 01/19/2022 12:47:17 [name] ([id number]) -------------------------------------------------------------------------------- Encounter Discharge Information Details Patient Name: Lawerance Sabal A. Date of Service: 01/19/2022 11:00 AM Medical Record Number: [id number] Patient Account Number: [id number] Date of Birth/Sex: 1940/10/14 (81 y.o. M) Treating RN: Carlene Coria Primary Care Luke Falero: Steele Sizer Other Clinician: Referring Loel Betancur: Steele Sizer Treating Mahina Salatino/Extender: Skipper Cliche in Treatment: 12 Encounter Discharge Information Items Discharge Condition: Stable Ambulatory Status: Wheelchair Discharge Destination: Home Transportation: Private Auto Accompanied By: caregiver Schedule Follow-up Appointment: Yes Clinical Summary of Care: Electronic Signature(s) Signed: 01/19/2022 12:48:02 PM By: Carlene Coria RN Entered By: Carlene Coria on 01/19/2022 12:48:02 [name] ([id number]) -------------------------------------------------------------------------------- Lower Extremity Assessment Details Patient Name: [name]. Date of Service: 01/19/2022 11:00 AM Medical Record Number: [id number] Patient Account Number: [id number] Date of Birth/Sex: 03/30/1941 (81 y.o. M) Treating RN: Carlene Coria Primary Care Kipper Buch: Steele Sizer Other Clinician: Referring Solange Emry: Steele Sizer Treating Reiss Mowrey/Extender: Skipper Cliche in Treatment: 12 Vascular Assessment Pulses: Dorsalis Pedis Palpable: [Left:Yes] Electronic Signature(s) Signed: 01/19/2022 11:18:00 AM By: Carlene Coria RN Entered By: Carlene Coria on 01/19/2022 11:18:00 [name] ([id number]) -------------------------------------------------------------------------------- Multi Wound Chart Details Patient Name: [name]. Date of Service: 01/19/2022 11:00 AM Medical Record Number: [id number] Patient Account Number: [id number] Date of Birth/Sex: 12/08/1940 (81 y.o. M) Treating RN: Carlene Coria Primary Care Kharter Brew: Steele Sizer Other Clinician: Referring Mariam Helbert: Steele Sizer Treating Jasmina Gendron/Extender: Skipper Cliche in Treatment: 12 Vital Signs Height(in): 63 Pulse(bpm): 55 Weight(lbs): 135 Blood Pressure(mmHg): 103/66 Body Mass Index(BMI): 23.9 Temperature(F): 97.8 Respiratory Rate(breaths/min):  18 Photos: [N/A:N/A] Wound Location: Left Calcaneus Right Gluteus N/A Wounding Event: Gradually Appeared Pressure Injury N/A Primary Etiology: Pressure Ulcer Pressure Ulcer N/A Date Acquired: 09/22/2021 11/07/2021 N/A Weeks of Treatment: 12 6 N/A Wound Status: Open Open N/A Wound Recurrence: No No N/A Measurements L x W x D (cm) 0.5x0.5x0.1 0.5x0.4x0.1 N/A Area (cm) : 0.196 0.157 N/A Volume (cm) : 0.02 0.016 N/A % Reduction in Area: 95.80% 83.30% N/A % Reduction in Volume: 95.80% 83.00% N/A Classification: Category/Stage III Category/Stage III N/A Exudate Amount: Medium Small N/A Exudate Type: Serosanguineous Serosanguineous N/A  Exudate Color: red, brown red, brown N/A Granulation Amount: Medium (34-66%) Small (1-33%) N/A Granulation Quality: Red, Pink Red N/A Necrotic Amount: Medium (34-66%) Large (67-100%) N/A Exposed Structures: Fat Layer (Subcutaneous Tissue): Fat Layer (Subcutaneous Tissue): N/A Yes Yes Fascia: No Fascia: No Tendon: No Tendon: No Muscle: No Muscle: No Joint: No Joint: No Bone: No Bone: No Epithelialization: Small (1-33%) None N/A Treatment Notes Electronic Signature(s) Signed: 01/19/2022 11:19:28 AM By: Carlene Coria RN Entered By: Carlene Coria on 01/19/2022 11:19:28 [name] ([id number]) -------------------------------------------------------------------------------- Multi-Disciplinary Care Plan Details Patient Name: Lawerance Sabal A. Date of Service: 01/19/2022 11:00 AM Medical Record Number: [id number] Patient Account Number: [id number] Date of Birth/Sex: [birth year] (81 y.o. M) Treating RN: Carlene Coria Primary Care Ellesse Antenucci: Steele Sizer Other Clinician: Referring Evann Koelzer: Steele Sizer Treating Ciro Tashiro/Extender: Skipper Cliche in Treatment: 12 Active Inactive Wound/Skin Impairment Nursing Diagnoses: Knowledge deficit related to ulceration/compromised skin integrity Goals: Patient/caregiver will verbalize understanding  of skin care regimen Date Initiated: 10/27/2021 Target Resolution Date: 02/18/2022 Goal Status: Active Ulcer/skin breakdown will have a volume reduction of 30% by week 4 Date Initiated: 10/27/2021 Date Inactivated: 01/19/2022 Target Resolution Date: 11/27/2021 Goal Status: Unmet Unmet Reason: comordbities Ulcer/skin breakdown will have a volume reduction of 50% by week 8 Date Initiated: 10/27/2021 Date Inactivated: 01/19/2022 Target Resolution Date: 12/28/2021 Goal Status: Unmet Unmet Reason: comordbities Ulcer/skin breakdown will have a volume reduction of 80% by week 12 Date Initiated: 10/27/2021 Target Resolution Date: 01/27/2022 Goal Status: Active Ulcer/skin breakdown will heal within 14 weeks Date Initiated: 10/27/2021 Target Resolution Date: 02/27/2022 Goal Status: Active Interventions: Assess patient/caregiver ability to obtain necessary supplies Assess patient/caregiver ability to perform ulcer/skin care regimen upon admission and as needed Assess ulceration(s) every visit Notes: Electronic Signature(s) Signed: 01/19/2022 11:19:01 AM By: Carlene Coria RN Entered By: Carlene Coria on 01/19/2022 11:19:01 [name] ([id number]) -------------------------------------------------------------------------------- Pain Assessment Details Patient Name: [name]. Date of Service: 01/19/2022 11:00 AM Medical Record Number: [id number] Patient Account Number: [id number] Date of Birth/Sex: 1941/02/09 (81 y.o. M) Treating RN: Carlene Coria Primary Care Fantasy Donald: Steele Sizer Other Clinician: Referring Nitisha Civello: Steele Sizer Treating Prisma Decarlo/Extender: Skipper Cliche in Treatment: 12 Active Problems Location of Pain Severity and Description of Pain Patient Has Paino No Site Locations Pain Management and Medication Current Pain Management: Electronic Signature(s) Signed: 01/19/2022 11:16:02 AM By: Carlene Coria RN Entered By: Carlene Coria on 01/19/2022 11:16:02 [name] ([id number]) -------------------------------------------------------------------------------- Patient/Caregiver Education Details Patient Name: [name]. Date of Service: 01/19/2022 11:00 AM Medical Record Number: [id number] Patient Account Number: [id number] Date of Birth/Gender: 02-15-1941 (81 y.o. M) Treating RN: Carlene Coria Primary Care Physician: Steele Sizer Other Clinician: Referring Physician: Steele Sizer Treating Physician/Extender: Skipper Cliche in Treatment: 12 Education Assessment Education Provided To: Patient Education Topics Provided Wound/Skin Impairment: Methods: Explain/Verbal Responses: State content correctly Electronic Signature(s) Signed: 01/19/2022 12:48:30 PM By: Carlene Coria RN Entered By: Carlene Coria on 01/19/2022 12:47:33 [name] ([id number]) -------------------------------------------------------------------------------- Wound Assessment Details Patient Name: [name]. Date of Service: 01/19/2022 11:00 AM Medical Record Number: [id number] Patient Account Number: [id number] Date of Birth/Sex: 01/26/1941 (81 y.o. M) Treating RN: Carlene Coria Primary Care Glenna Brunkow: Steele Sizer Other Clinician: Referring Raley Novicki: Steele Sizer Treating Alannah Averhart/Extender: Skipper Cliche in Treatment: 12 Wound Status Wound Number: 1 Primary Etiology: Pressure Ulcer Wound Location: Left Calcaneus Wound Status: Open Wounding Event: Gradually Appeared Date Acquired: 09/22/2021 Weeks Of Treatment: 12 Clustered Wound: No Photos Wound Measurements Length: (cm) 0.5 Width: (cm) 0.5 Depth: (cm)  0.1 Area: (cm) 0.196 Volume: (cm) 0.02 % Reduction in Area: 95.8% % Reduction in Volume: 95.8% Epithelialization: Small (1-33%) Tunneling: No Undermining: No Wound Description Classification: Category/Stage III Exudate Amount: Medium Exudate Type: Serosanguineous Exudate Color: red, brown Foul Odor After Cleansing:  No Slough/Fibrino Yes Wound Bed Granulation Amount: Medium (34-66%) Exposed Structure Granulation Quality: Red, Pink Fascia Exposed: No Necrotic Amount: Medium (34-66%) Fat Layer (Subcutaneous Tissue) Exposed: Yes Necrotic Quality: Adherent Slough Tendon Exposed: No Muscle Exposed: No Joint Exposed: No Bone Exposed: No Treatment Notes Wound #1 (Calcaneus) Wound Laterality: Left Cleanser Soap and Water Discharge Instruction: Gently cleanse wound with antibacterial soap, rinse and pat dry prior to dressing wounds Peri-Wound Care MARKANTHONY, GEDNEY ([id number]) Topical Primary Dressing Silvercel Small 2x2 (in/in) Discharge Instruction: Apply Silvercel Small 2x2 (in/in) as instructed Secondary Dressing ABD Pad 5x9 (in/in) Discharge Instruction: Cover with ABD pad Secured With Medipore Tape - 65M Medipore H Soft Cloth Surgical Tape, 2x2 (in/yd) Kerlix Roll Sterile or Non-Sterile 6-ply 4.5x4 (yd/yd) Discharge Instruction: Apply Kerlix as directed Compression Wrap Compression Stockings Add-Ons Electronic Signature(s) Signed: 01/19/2022 11:16:45 AM By: Carlene Coria RN Entered By: Carlene Coria on 01/19/2022 11:16:45 [name] ([id number]) -------------------------------------------------------------------------------- Wound Assessment Details Patient Name: Lawerance Sabal A. Date of Service: 01/19/2022 11:00 AM Medical Record Number: [id number] Patient Account Number: [id number] Date of Birth/Sex: 04-06-1941 (81 y.o. M) Treating RN: Carlene Coria Primary Care Ahri Olson: Steele Sizer Other Clinician: Referring Nino Amano: Steele Sizer Treating Peytyn Trine/Extender: Skipper Cliche in Treatment: 12 Wound Status Wound Number: 2 Primary Etiology: Pressure Ulcer Wound Location: Right Gluteus Wound Status: Open Wounding Event: Pressure Injury Date Acquired: 11/07/2021 Weeks Of Treatment: 6 Clustered Wound: No Photos Wound Measurements Length: (cm) 0.5 Width: (cm)  0.4 Depth: (cm) 0.1 Area: (cm) 0.157 Volume: (cm) 0.016 % Reduction in Area: 83.3% % Reduction in Volume: 83% Epithelialization: None Tunneling: No Undermining: No Wound Description Classification: Category/Stage III Exudate Amount: Small Exudate Type: Serosanguineous Exudate Color: red, brown Foul Odor After Cleansing: No Slough/Fibrino Yes Wound Bed Granulation Amount: Small (1-33%) Exposed Structure Granulation Quality: Red Fascia Exposed: No Necrotic Amount: Large (67-100%) Fat Layer (Subcutaneous Tissue) Exposed: Yes Necrotic Quality: Adherent Slough Tendon Exposed: No Muscle Exposed: No Joint Exposed: No Bone Exposed: No Treatment Notes Wound #2 (Gluteus) Wound Laterality: Right Cleanser Normal Saline Discharge Instruction: Wash your hands with soap and water. Remove old dressing, discard into plastic bag and place into trash. Cleanse the wound with Normal Saline prior to applying a clean dressing using gauze sponges, not tissues or cotton balls. Do not scrub or use excessive force. Pat dry using gauze sponges, not tissue or cotton balls. Soap and Water TORY, SEPTER ([id number]) Discharge Instruction: Gently cleanse wound with antibacterial soap, rinse and pat dry prior to dressing wounds Wound Cleanser Discharge Instruction: Wash your hands with soap and water. Remove old dressing, discard into plastic bag and place into trash. Cleanse the wound with Wound Cleanser prior to applying a clean dressing using gauze sponges, not tissues or cotton balls. Do not scrub or use excessive force. Pat dry using gauze sponges, not tissue or cotton balls. Peri-Wound Care Topical calmaceptine Discharge Instruction: apply to buttocks 2 times per day and PRN incontinet episodes Primary Dressing Secondary Dressing Secured With Compression Wrap Compression Stockings Add-Ons Electronic Signature(s) Signed: 01/19/2022 11:17:33 AM By: Carlene Coria RN Entered By: Carlene Coria on 01/19/2022 11:17:33 [name] ([id number]) -------------------------------------------------------------------------------- Vitals Details Patient Name: [name]. Date of Service: 01/19/2022 11:00 AM Medical Record  Number: [id number] Patient Account Number: [id number] Date of Birth/Sex: [birth year] (81 y.o. M) Treating RN: Carlene Coria Primary Care Maylyn Narvaiz: Steele Sizer Other Clinician: Referring Brandey Vandalen: Steele Sizer Treating Doniel Maiello/Extender: Skipper Cliche in Treatment: 12 Vital Signs Time Taken: 11:00 Temperature (F): 97.8 Height (in): 63 Pulse (bpm): 55 Weight (lbs): 135 Respiratory Rate (breaths/min): 18 Body Mass Index (BMI): 23.9 Blood Pressure (mmHg): 103/66 Reference Range: 80 - 120 mg / dl Electronic Signature(s) Signed: 01/19/2022 11:15:52 AM By: Carlene Coria RN Entered By: Carlene Coria on 01/19/2022 11:15:51

## 2022-01-19 NOTE — Progress Notes (Addendum)
AMILCAR, REEVER ([id number]) Visit Report for 01/19/2022 Chief Complaint Document Details Patient Name: [name], [name]. Date of Service: 01/19/2022 11:00 AM Medical Record Number: [id number] Patient Account Number: [id number] Date of Birth/Sex: 1941/04/21 (81 y.o. M) Treating RN: Carlene Coria Primary Care Provider: Steele Sizer Other Clinician: Referring Provider: Steele Sizer Treating Provider/Extender: Skipper Cliche in Treatment: 12 Information Obtained from: Patient Chief Complaint Left heel pressure ulcer and right buttock pressure ulcer Electronic Signature(s) Signed: 01/19/2022 11:16:32 AM By: Worthy Keeler PA-C Entered By: Worthy Keeler on 01/19/2022 11:16:32 [name] ([id number]) -------------------------------------------------------------------------------- HPI Details Patient Name: [name] Date of Service: 01/19/2022 11:00 AM Medical Record Number: [id number] Patient Account Number: [id number] Date of Birth/Sex: Sep 10, 1940 (81 y.o. M) Treating RN: Carlene Coria Primary Care Provider: Steele Sizer Other Clinician: Referring Provider: Steele Sizer Treating Provider/Extender: Skipper Cliche in Treatment: 12 History of Present Illness HPI Description: 10-27-2021 upon evaluation today patient presents for initial evaluation here in the clinic concerning an issue that has been present since around June 1. Subsequently the patient's caregiver from [name] tells me that he has been treated mainly by herself she has been doing what she can to try to take care of this using DuoDERM over the area. There is a lot of skin that is actually loosen up over the top of the heel surface but not all of this is necessarily covering wound in fact there is a majority of this that is not wound related as far as the skin over top is concerned. Fortunately I do not see any signs of active infection locally or systemically at this time which is great  news. He does have some slough over the surface of the wound but mainly the skin around the edges the thing I am most concerned with. It is trapping a lot of fluid to be honest. Patient does have a history of chronic venous insufficiency, cerebral palsy, and stated peripheral vascular disease. With that being said I do not see any evidence right now of active infection which is great news. No fevers, chills, nausea, vomiting, or diarrhea. 11-10-2021 upon evaluation today patient appears to be doing well currently in regard to his wound on the heel. Fortunately there does not appear to be any signs of active infection locally or systemically at this time which is great news. Fortunately I do not see any evidence of infection which is also excellent. No fevers, chills, nausea, vomiting, or diarrhea. 12-08-2021 upon evaluation today patient appears to be doing well currently in regard to his heel although unfortunately has a right gluteal pressure ulcer. Fortunately there does not appear to be any signs of active infection locally or systemically which is great news and overall I am extremely pleased with where we stand today. 12-29-2021 upon evaluation patient's heel as well as gluteal area both appear to be doing better. There does not appear to be any signs of active infection locally or systemically at this time which is great news. No fevers, chills, nausea, vomiting, or diarrhea. 01-19-2022 upon evaluation today patient appears to be doing well with regard to his wound. He has been tolerating the dressing changes without complication. Fortunately there does not appear to be any evidence of active infection locally or systemically at this time which is great news. No fevers, chills, nausea, vomiting, or diarrhea. Electronic Signature(s) Signed: 01/19/2022 1:34:39 PM By: Worthy Keeler PA-C Entered By: Worthy Keeler on 01/19/2022 13:34:39 [name]  ([id number]) --------------------------------------------------------------------------------  Physical Exam Details Patient Name: [name]. Date of Service: 01/19/2022 11:00 AM Medical Record Number: [id number] Patient Account Number: [id number] Date of Birth/Sex: June 23, 1940 (81 y.o. M) Treating RN: Carlene Coria Primary Care Provider: Steele Sizer Other Clinician: Referring Provider: Steele Sizer Treating Provider/Extender: Skipper Cliche in Treatment: 25 Constitutional Well-nourished and well-hydrated in no acute distress. Respiratory normal breathing without difficulty. Psychiatric this patient is able to make decisions and demonstrates good insight into disease process. Alert and Oriented x 3. pleasant and cooperative. Notes Upon inspection patient's wounds are showing signs of improvement in fact I think that the wound on the gluteal region is really pretty much about healed. This is great news and overall I think by next time it would probably be healed the heel itself is also doing quite well. Electronic Signature(s) Signed: 01/19/2022 1:35:00 PM By: Worthy Keeler PA-C Entered By: Worthy Keeler on 01/19/2022 13:35:00 [name] ([id number]) -------------------------------------------------------------------------------- Physician Orders Details Patient Name: [name] Date of Service: 01/19/2022 11:00 AM Medical Record Number: [id number] Patient Account Number: [id number] Date of Birth/Sex: [birth year] (81 y.o. M) Treating RN: Carlene Coria Primary Care Provider: Steele Sizer Other Clinician: Referring Provider: Steele Sizer Treating Provider/Extender: Skipper Cliche in Treatment: 12 Verbal / Phone Orders: No Diagnosis Coding ICD-10 Coding Code Description [phone] Pressure ulcer of left heel, stage 3 L89.310 Pressure ulcer of right buttock, unstageable G80.8 Other cerebral palsy I73.89 Other specified peripheral vascular  diseases I87.393 Chronic venous hypertension (idiopathic) with other complications of bilateral lower extremity Follow-up Appointments o Return Appointment in 1 month Bathing/ Shower/ Hygiene o May shower; gently cleanse wound with antibacterial soap, rinse and pat dry prior to dressing wounds Anesthetic (Use 'Patient Medications' Section for Anesthetic Order Entry) o Lidocaine applied to wound bed Non-Wound Condition o Additional non-wound orders/instructions: - clean buttocks with soap and water , pat dry , apply calmoseptine ointment after each incontent episode Off-Loading o Roho cushion for wheelchair o Hospital bed/mattress o Gel mattress overlay (Group 1) o Other: - prevalon boots to bi lat feet / legs at all times to protect heels Additional Orders / Instructions o Other: - zinc oxide or destin to be used for skin breakdown in gluteal area Wound Treatment Wound #1 - Calcaneus Wound Laterality: Left Cleanser: Soap and Water 3 x Per Week/30 Days Discharge Instructions: Gently cleanse wound with antibacterial soap, rinse and pat dry prior to dressing wounds Primary Dressing: Silvercel Small 2x2 (in/in) (Generic) 3 x Per Week/30 Days Discharge Instructions: Apply Silvercel Small 2x2 (in/in) as instructed Secondary Dressing: ABD Pad 5x9 (in/in) (Generic) 3 x Per Week/30 Days Discharge Instructions: Cover with ABD pad Secured With: Medipore Tape - 78M Medipore H Soft Cloth Surgical Tape, 2x2 (in/yd) (Generic) 3 x Per Week/30 Days Secured With: [employer] Sterile or Non-Sterile 6-ply 4.5x4 (yd/yd) (Generic) 3 x Per Week/30 Days Discharge Instructions: Apply Kerlix as directed Wound #2 - Gluteus Wound Laterality: Right Cleanser: Normal Saline 2 x Per Day/30 Days Discharge Instructions: Wash your hands with soap and water. Remove old dressing, discard into plastic bag and place into trash. Cleanse the wound with Normal Saline prior to applying a clean dressing using  gauze sponges, not tissues or cotton balls. Do not scrub or use excessive force. Pat dry using gauze sponges, not tissue or cotton balls. Cleanser: Soap and Water 2 x Per Day/30 Days UZAIR, GODLEY ([id number]) Discharge Instructions: Gently cleanse wound with antibacterial soap, rinse and pat dry prior to dressing wounds  Cleanser: Wound Cleanser 2 x Per Day/30 Days Discharge Instructions: Wash your hands with soap and water. Remove old dressing, discard into plastic bag and place into trash. Cleanse the wound with Wound Cleanser prior to applying a clean dressing using gauze sponges, not tissues or cotton balls. Do not scrub or use excessive force. Pat dry using gauze sponges, not tissue or cotton balls. Topical: calmaceptine 2 x Per Day/30 Days Discharge Instructions: apply to buttocks 2 times per day and PRN incontinet episodes Electronic Signature(s) Signed: 01/19/2022 12:48:30 PM By: Carlene Coria RN Signed: 01/19/2022 3:32:36 PM By: Worthy Keeler PA-C Entered By: Carlene Coria on 01/19/2022 11:38:11 [name] ([id number]) -------------------------------------------------------------------------------- Problem List Details Patient Name: [name]. Date of Service: 01/19/2022 11:00 AM Medical Record Number: [id number] Patient Account Number: [id number] Date of Birth/Sex: 08/29/1940 (81 y.o. M) Treating RN: Carlene Coria Primary Care Provider: Steele Sizer Other Clinician: Referring Provider: Steele Sizer Treating Provider/Extender: Skipper Cliche in Treatment: 12 Active Problems ICD-10 Encounter Code Description Active Date MDM Diagnosis [phone] Pressure ulcer of left heel, stage 3 10/27/2021 No Yes L89.310 Pressure ulcer of right buttock, unstageable 12/08/2021 No Yes G80.8 Other cerebral palsy 10/27/2021 No Yes I73.89 Other specified peripheral vascular diseases 10/27/2021 No Yes I87.393 Chronic venous hypertension (idiopathic) with other complications of  05/25/9756 No Yes bilateral lower extremity Inactive Problems Resolved Problems Electronic Signature(s) Signed: 01/19/2022 11:16:23 AM By: Worthy Keeler PA-C Entered By: Worthy Keeler on 01/19/2022 11:16:23 [name] ([id number]) -------------------------------------------------------------------------------- Progress Note Details Patient Name: [name]. Date of Service: 01/19/2022 11:00 AM Medical Record Number: [id number] Patient Account Number: [id number] Date of Birth/Sex: [birth year] (81 y.o. M) Treating RN: Carlene Coria Primary Care Provider: Steele Sizer Other Clinician: Referring Provider: Steele Sizer Treating Provider/Extender: Skipper Cliche in Treatment: 12 Subjective Chief Complaint Information obtained from Patient Left heel pressure ulcer and right buttock pressure ulcer History of Present Illness (HPI) 10-27-2021 upon evaluation today patient presents for initial evaluation here in the clinic concerning an issue that has been present since around June 1. Subsequently the patient's caregiver from [name] tells me that he has been treated mainly by herself she has been doing what she can to try to take care of this using DuoDERM over the area. There is a lot of skin that is actually loosen up over the top of the heel surface but not all of this is necessarily covering wound in fact there is a majority of this that is not wound related as far as the skin over top is concerned. Fortunately I do not see any signs of active infection locally or systemically at this time which is great news. He does have some slough over the surface of the wound but mainly the skin around the edges the thing I am most concerned with. It is trapping a lot of fluid to be honest. Patient does have a history of chronic venous insufficiency, cerebral palsy, and stated peripheral vascular disease. With that being said I do not see any evidence right now of active infection  which is great news. No fevers, chills, nausea, vomiting, or diarrhea. 11-10-2021 upon evaluation today patient appears to be doing well currently in regard to his wound on the heel. Fortunately there does not appear to be any signs of active infection locally or systemically at this time which is great news. Fortunately I do not see any evidence of infection which is also excellent. No fevers, chills, nausea, vomiting, or diarrhea.  12-08-2021 upon evaluation today patient appears to be doing well currently in regard to his heel although unfortunately has a right gluteal pressure ulcer. Fortunately there does not appear to be any signs of active infection locally or systemically which is great news and overall I am extremely pleased with where we stand today. 12-29-2021 upon evaluation patient's heel as well as gluteal area both appear to be doing better. There does not appear to be any signs of active infection locally or systemically at this time which is great news. No fevers, chills, nausea, vomiting, or diarrhea. 01-19-2022 upon evaluation today patient appears to be doing well with regard to his wound. He has been tolerating the dressing changes without complication. Fortunately there does not appear to be any evidence of active infection locally or systemically at this time which is great news. No fevers, chills, nausea, vomiting, or diarrhea. Objective Constitutional Well-nourished and well-hydrated in no acute distress. Vitals Time Taken: 11:00 AM, Height: 63 in, Weight: 135 lbs, BMI: 23.9, Temperature: 97.8 F, Pulse: 55 bpm, Respiratory Rate: 18 breaths/min, Blood Pressure: 103/66 mmHg. Respiratory normal breathing without difficulty. Psychiatric this patient is able to make decisions and demonstrates good insight into disease process. Alert and Oriented x 3. pleasant and cooperative. General Notes: Upon inspection patient's wounds are showing signs of improvement in fact I think that the  wound on the gluteal region is really pretty much about healed. This is great news and overall I think by next time it would probably be healed the heel itself is also doing quite well. Integumentary (Hair, Skin) Wound #1 status is Open. Original cause of wound was Gradually Appeared. The date acquired was: 09/22/2021. The wound has been in treatment 12 weeks. The wound is located on the Left Calcaneus. The wound measures 0.5cm length x 0.5cm width x 0.1cm depth; 0.196cm^2 area and 0.02cm^3 volume. There is Fat Layer (Subcutaneous Tissue) exposed. There is no tunneling or undermining noted. There is a medium amount of serosanguineous drainage noted. There is medium (34-66%) red, pink granulation within the wound bed. There is a medium (34-66%) amount of necrotic tissue within the wound bed including Adherent Slough. RUBENS, CRANSTON ([id number]) Wound #2 status is Open. Original cause of wound was Pressure Injury. The date acquired was: 11/07/2021. The wound has been in treatment 6 weeks. The wound is located on the Right Gluteus. The wound measures 0.5cm length x 0.4cm width x 0.1cm depth; 0.157cm^2 area and 0.016cm^3 volume. There is Fat Layer (Subcutaneous Tissue) exposed. There is no tunneling or undermining noted. There is a small amount of serosanguineous drainage noted. There is small (1-33%) red granulation within the wound bed. There is a large (67-100%) amount of necrotic tissue within the wound bed including Adherent Slough. Assessment Active Problems ICD-10 Pressure ulcer of left heel, stage 3 Pressure ulcer of right buttock, unstageable Other cerebral palsy Other specified peripheral vascular diseases Chronic venous hypertension (idiopathic) with other complications of bilateral lower extremity Plan Follow-up Appointments: Return Appointment in 1 month Bathing/ Shower/ Hygiene: May shower; gently cleanse wound with antibacterial soap, rinse and pat dry prior to dressing  wounds Anesthetic (Use 'Patient Medications' Section for Anesthetic Order Entry): Lidocaine applied to wound bed Non-Wound Condition: Additional non-wound orders/instructions: - clean buttocks with soap and water , pat dry , apply calmoseptine ointment after each incontent episode Off-Loading: Roho cushion for wheelchair Hospital bed/mattress Gel mattress overlay (Group 1) Other: - prevalon boots to bi lat feet / legs at all times to protect  heels Additional Orders / Instructions: Other: - zinc oxide or destin to be used for skin breakdown in gluteal area WOUND #1: - Calcaneus Wound Laterality: Left Cleanser: Soap and Water 3 x Per Week/30 Days Discharge Instructions: Gently cleanse wound with antibacterial soap, rinse and pat dry prior to dressing wounds Primary Dressing: Silvercel Small 2x2 (in/in) (Generic) 3 x Per Week/30 Days Discharge Instructions: Apply Silvercel Small 2x2 (in/in) as instructed Secondary Dressing: ABD Pad 5x9 (in/in) (Generic) 3 x Per Week/30 Days Discharge Instructions: Cover with ABD pad Secured With: Medipore Tape - 36M Medipore H Soft Cloth Surgical Tape, 2x2 (in/yd) (Generic) 3 x Per Week/30 Days Secured With: [employer] Sterile or Non-Sterile 6-ply 4.5x4 (yd/yd) (Generic) 3 x Per Week/30 Days Discharge Instructions: Apply Kerlix as directed WOUND #2: - Gluteus Wound Laterality: Right Cleanser: Normal Saline 2 x Per Day/30 Days Discharge Instructions: Wash your hands with soap and water. Remove old dressing, discard into plastic bag and place into trash. Cleanse the wound with Normal Saline prior to applying a clean dressing using gauze sponges, not tissues or cotton balls. Do not scrub or use excessive force. Pat dry using gauze sponges, not tissue or cotton balls. Cleanser: Soap and Water 2 x Per Day/30 Days Discharge Instructions: Gently cleanse wound with antibacterial soap, rinse and pat dry prior to dressing wounds Cleanser: Wound Cleanser 2 x Per  Day/30 Days Discharge Instructions: Wash your hands with soap and water. Remove old dressing, discard into plastic bag and place into trash. Cleanse the wound with Wound Cleanser prior to applying a clean dressing using gauze sponges, not tissues or cotton balls. Do not scrub or use excessive force. Pat dry using gauze sponges, not tissue or cotton balls. Topical: calmaceptine 2 x Per Day/30 Days Discharge Instructions: apply to buttocks 2 times per day and PRN incontinet episodes 1. I am going to suggest that we go ahead and continue with the recommendation for wound care measures as before and the patient is in agreement with plan for using the Calmoseptine to the gluteal region I do not think any other dressing is needed right now. 2. I am also can recommend continuation of silver alginate to the heel. 3. I would suggest the patient continue to monitor for any signs of worsening or infection if anything changes he should let me know. We will see patient back for reevaluation in 1 month here in the clinic. If anything worsens or changes patient will contact our office for additional recommendations. FINES, KIMBERLIN ([id number]) Electronic Signature(s) Signed: 01/19/2022 1:35:52 PM By: Worthy Keeler PA-C Previous Signature: 01/19/2022 1:35:30 PM Version By: Worthy Keeler PA-C Entered By: Worthy Keeler on 01/19/2022 13:35:51 [name] ([id number]) -------------------------------------------------------------------------------- SuperBill Details Patient Name: [name] Date of Service: 01/19/2022 Medical Record Number: [id number] Patient Account Number: [id number] Date of Birth/Sex: [birth year] (81 y.o. M) Treating RN: Carlene Coria Primary Care Provider: Steele Sizer Other Clinician: Referring Provider: Steele Sizer Treating Provider/Extender: Skipper Cliche in Treatment: 12 Diagnosis Coding ICD-10 Codes Code Description [phone] Pressure ulcer of left  heel, stage 3 L89.310 Pressure ulcer of right buttock, unstageable G80.8 Other cerebral palsy I73.89 Other specified peripheral vascular diseases I87.393 Chronic venous hypertension (idiopathic) with other complications of bilateral lower extremity Facility Procedures CPT4 Code: 98921194 Description: 99213 - WOUND CARE VISIT-LEV 3 EST PT Modifier: Quantity: 1 Physician Procedures CPT4 Code: 1740814 Description: 48185 - WC PHYS LEVEL 3 - EST PT Modifier: Quantity: 1 CPT4 Code: Description: ICD-10 Diagnosis  Description [phone] Pressure ulcer of left heel, stage 3 L89.310 Pressure ulcer of right buttock, unstageable G80.8 Other cerebral palsy I73.89 Other specified peripheral vascular diseases Modifier: Quantity: Electronic Signature(s) Signed: 01/19/2022 1:36:09 PM By: Worthy Keeler PA-C Previous Signature: 01/19/2022 12:47:22 PM Version By: Carlene Coria RN Entered By: Worthy Keeler on 01/19/2022 13:36:09

## 2022-01-31 NOTE — Progress Notes (Unsigned)
Name: [name]   MRN: [id number]    DOB: 09/17/1940   Date:02/01/2022       Progress Note  Subjective  Chief Complaint  Follow Up  HPI  Atherosclerosis of aorta/PAD: he has been compliant with Atorvastatin , no side effects, Last LDL was 34   Chronic constipation: doing well on Amitiza, bowel movements daily , [place] 6. No abdominal pain . He wears depends   MDD: he is doing much better, he likes the new group home, taking zoloft, alprazolam very seldom now, agitation is very seldom now. His pain seems to be controlled, he still has a kidney stone that could not removed by cystoscopy and family prefers not pursuing surgery at this time. He seems well and is now gaining weight.    Cerebral Palsy/ history of polio : lives in a group home - he came in today with [name] - he is wheelchair bound, unable to transfer, he also has some atrophy of  both hands. He is having assistance to eat , due to dropping his spoon - seems to have helped him gain weight. He needs help bathing , transferring, transportation, medication taking ,he has urine and bowel incontinent, he wears depends.  Sacral ulcer has healed but still has bilateral heel ulcers and is under the care of [facility] . He has been wearing heel protector boots   Anemia: history of iron deficiency anemia due to rectal bleeding but doing well lately, ,last W73 and folic also back to normal Anemia is mild now  Patient Active Problem List   Diagnosis Date Noted   Urinary incontinence 05/26/2021   Hypotension 05/20/2021   History of rectal bleeding 05/09/2020   Hematochezia    BPH (benign prostatic hyperplasia)    Hyperlipidemia    Depression with anxiety    Personal history of urinary calculi 06/21/2019   PAD (peripheral artery disease) ([place]) 07/04/2017   Aortic valve calcification 07/04/2017   Coronary artery calcification 07/04/2017   Late effect acute polio 05/17/2016   Chronic constipation 11/19/2015   Mild major  depression ([place]) 11/19/2015   Bacteriuria, chronic 09/11/2015   History of prostate cancer 03/02/2015   Amyotrophia 03/02/2015   Edema leg 03/02/2015   Cerebral palsy ([place]) 03/02/2015   Dyslipidemia 03/02/2015   Dermatitis, eczematoid 03/02/2015   Divergent squint 03/02/2015   H/O acute poliomyelitis 03/02/2015   Lymphedema 03/02/2015   Intellectual disability 03/02/2015   Hypertrophy of nail 03/02/2015   Chronic venous insufficiency 03/02/2015   Avitaminosis D 03/02/2015   Adynamia 03/02/2015   Dependent on wheelchair 03/02/2015   Renal cysts, acquired, bilateral 03/02/2015   At risk for falling 03/02/2015   Cerebral vascular disease 03/02/2015   Strabismus 02/11/2015    Past Surgical History:  Procedure Laterality Date   COLONOSCOPY WITH PROPOFOL N/A 05/10/2020   Procedure: COLONOSCOPY WITH PROPOFOL;  Surgeon: Lin Landsman, MD;  Location: [facility] ENDOSCOPY;  Service: Gastroenterology;  Laterality: N/A;   COLONOSCOPY WITH PROPOFOL N/A 05/12/2020   Procedure: COLONOSCOPY WITH PROPOFOL;  Surgeon: Lin Landsman, MD;  Location: [facility] ENDOSCOPY;  Service: Gastroenterology;  Laterality: N/A;   CYSTOSCOPY W/ RETROGRADES Left 10/04/2021   Procedure: CYSTOSCOPY WITH RETROGRADE PYELOGRAM;  Surgeon: Abbie Sons, MD;  Location: [facility] ORS;  Service: Urology;  Laterality: Left;   CYSTOSCOPY/URETEROSCOPY/HOLMIUM LASER/STENT PLACEMENT Right 08/14/2017   Procedure: CYSTOSCOPY/URETEROSCOPY/HOLMIUM LASER/STENT PLACEMENT;  Surgeon: Abbie Sons, MD;  Location: [facility] ORS;  Service: Urology;  Laterality: Right;   CYSTOSCOPY/URETEROSCOPY/HOLMIUM LASER/STENT PLACEMENT Left 10/04/2021  Procedure: CYSTOSCOPY/URETEROSCOPY/HOLMIUM LASER/STENT PLACEMENT/ LITHOPAXY;  Surgeon: Abbie Sons, MD;  Location: [facility] ORS;  Service: Urology;  Laterality: Left;   ESOPHAGOGASTRODUODENOSCOPY (EGD) WITH PROPOFOL N/A 05/12/2020   Procedure: ESOPHAGOGASTRODUODENOSCOPY (EGD) WITH PROPOFOL;  Surgeon: Lin Landsman, MD;  Location[facility] ENDOSCOPY;  Service: Gastroenterology;  Laterality: N/A;   INCISION AND DRAINAGE ABSCESS Left 07/05/2021   Procedure: INCISION AND DRAINAGE SCROTAL WALL ABSCESS;  Surgeon: Abbie Sons, MD;  Location: [facility] ORS;  Service: Urology;  Laterality: Left;   leg circulation surgery Right    PROSTATE SURGERY  11/10/2008   BRACHYTHERAPY   SCROTAL EXPLORATION Left 07/05/2021   Procedure: SCROTUM EXPLORATION;  Surgeon: Abbie Sons, MD;  Location: [facility] ORS;  Service: Urology;  Laterality: Left;    Family History  Problem Relation Age of Onset   Heart attack Brother    Heart disease Other     Social History   Tobacco Use   Smoking status: Never   Smokeless tobacco: Never  Substance Use Topics   Alcohol use: No    Alcohol/week: 0.0 standard drinks of alcohol     Current Outpatient Medications:    alfuzosin (UROXATRAL) 10 MG 24 hr tablet, TAKE 1 TABLET BY MOUTH DAILY, Disp: 90 tablet, Rfl: 1   ALPRAZolam (XANAX) 0.25 MG tablet, Take 1 tablet (0.25 mg total) by mouth daily as needed (agitation (crisis med))., Disp: 20 tablet, Rfl: 0   ARTIFICIAL TEARS 0.2-0.2-1 % SOLN, PLACE 2 DROPS INTO EYE 2 TIMES A DAY AS NEEDED, Disp: 15 mL, Rfl: 2   atorvastatin (LIPITOR) 40 MG tablet, TAKE 1 TABLET BY MOUTH EVERY EVENING, Disp: 90 tablet, Rfl: 1   augmented betamethasone dipropionate (DIPROLENE-AF) 0.05 % cream, APPLY TOPICALLY AS DIRECTED TO RASH AS NEEDED, Disp: 45 g, Rfl: 0   Calcium Carb-Cholecalciferol 500-10 MG-MCG TABS, TAKE 1 TABLET BY MOUTH 2 TIMES A DAY, Disp: 180 tablet, Rfl: 0   ciprofloxacin (CIPRO) 250 MG tablet, Take 1 tablet (250 mg total) by mouth 2 (two) times daily., Disp: 14 tablet, Rfl: 0   Cranberry Juice Powder 425 MG CAPS, TAKE 1 CAPSULE BY MOUTH ONCE DAILY, Disp: 90 capsule, Rfl: 1   Elastic Bandages & Supports (MEDICAL COMPRESSION STOCKINGS) MISC, 1 Units by Does not apply route daily., Disp: 2 each, Rfl: 0   GNP VITAMIN C 500 MG tablet,  TAKE ONE TABLET BY MOUTH ONCE DAILY, Disp: 100 tablet, Rfl: 1   Incontinence Supply Disposable (DEPEND SILHOUETTE BRIEFS L/XL) MISC, 1 each by Does not apply route 2 (two) times daily as needed., Disp: 60 each, Rfl: 5   Iron, Ferrous Sulfate, 325 (65 Fe) MG TABS, Take 325 mg by mouth daily., Disp: 30 tablet, Rfl: 3   lubiprostone (AMITIZA) 24 MCG capsule, TAKE 1 CAPSULE BY MOUTH 2 TIMES DAILY WITH A MEAL, Disp: 60 capsule, Rfl: 5   Nutritional Supplements (ENSURE ORIGINAL) LIQD, Take 1 Can by mouth 3 (three) times daily., Disp: 5.688 mL, Rfl: 5   nystatin cream (MYCOSTATIN), APPLY 1 APPLICATION TOPICALLY 2 TIMES DAILY, Disp: 30 g, Rfl: 0   Polyethyl Glycol-Propyl Glycol (SYSTANE ULTRA OP), Place 1 drop into both eyes 2 (two) times daily as needed (dry eyes)., Disp: , Rfl:    sertraline (ZOLOFT) 100 MG tablet, TAKE 1 TABLET BY MOUTH DAILY, Disp: 90 tablet, Rfl: 1  No Known Allergies  I personally reviewed active problem list, medication list, allergies, family history, social history, health maintenance with the patient/caregiver today.   ROS  Ten systems  reviewed and is negative except as mentioned in HPI   Objective  Vitals:   02/01/22 1411  BP: 112/62  Pulse: 60  Resp: 14  Temp: 98.3 F (36.8 C)  TempSrc: Oral  SpO2: 98%  Weight: 145 lb 6.4 oz (66 kg)  Height: '6\' 1"'$  (1.854 m)    Body mass index is 19.18 kg/m.  Physical Exam  Constitutional: Patient appears well-developed and underweight .  No distress.  HEENT: head atraumatic, normocephalic, pupils equal and reactive to light, neck supple, Cardiovascular: Normal rate, regular rhythm and normal heart sounds.  No murmur heard. No BLE edema. Pulmonary/Chest: Effort normal and breath sounds normal. No respiratory distress. Abdominal: Soft.  There is no tenderness. Muscular skeletal: atrophy hands and both legs, on wheelchair , wearing heel boots for pressure sores  Psychiatric: Patient has a normal mood and affect. behavior  is normal. Judgment and thought content normal.   Recent Results (from the past 2160 hour(s))  Urinalysis, Complete     Status: Abnormal   Collection Time: 01/05/22  4:22 PM  Result Value Ref Range   Specific Gravity, UA 1.020 1.005 - 1.030   pH, UA 5.5 5.0 - 7.5   Color, UA Yellow Yellow   Appearance Ur Cloudy (A) Clear   Leukocytes,UA 1+ (A) Negative   Protein,UA 2+ (A) Negative/Trace   Glucose, UA Negative Negative   Ketones, UA Negative Negative   RBC, UA 2+ (A) Negative   Bilirubin, UA Negative Negative   Urobilinogen, Ur 1.0 0.2 - 1.0 mg/dL   Nitrite, UA Positive (A) Negative   Microscopic Examination See below:   CULTURE, URINE COMPREHENSIVE     Status: Abnormal   Collection Time: 01/05/22  4:22 PM   Specimen: Urine   UR  Result Value Ref Range   Urine Culture, Comprehensive Final report (A)    Organism ID, Bacteria Comment (A)     Comment: Pseudomonas aeruginosa 50,000-100,000 colony forming units per mL    Organism ID, Bacteria Not applicable    ANTIMICROBIAL SUSCEPTIBILITY Comment     Comment:       ** S = Susceptible; I = Intermediate; R = Resistant **                    P = Positive; N = Negative             MICS are expressed in micrograms per mL    Antibiotic                 RSLT#1    RSLT#2    RSLT#3    RSLT#4 Amikacin                       S Cefepime                       S Ceftazidime                    S Ciprofloxacin                  S Gentamicin                     S Imipenem                       S Levofloxacin  S Meropenem                      S Piperacillin                   S Ticarcillin                    S Tobramycin                     S   Microscopic Examination     Status: Abnormal   Collection Time: 01/05/22  4:22 PM   Urine  Result Value Ref Range   WBC, UA >30 (A) 0 - 5 /hpf   RBC, Urine >30 (A) 0 - 2 /hpf   Epithelial Cells (non renal) 0-10 0 - 10 /hpf   Bacteria, UA Many (A) None seen/Few    PHQ2/9:     02/01/2022    2:14 PM 10/14/2021    2:12 PM 09/15/2021   10:05 AM 08/15/2021    9:11 AM 05/26/2021   11:10 AM  Depression screen PHQ 2/9  Decreased Interest 0 0 0 0 0  Down, Depressed, Hopeless 0 0 0 0 0  PHQ - 2 Score 0 0 0 0 0  Altered sleeping 0   0 0  Tired, decreased energy 0   0 0  Change in appetite 0   0 0  Feeling bad or failure about yourself  0   0 0  Trouble concentrating 0   0 0  Moving slowly or fidgety/restless 0   0 0  Suicidal thoughts 0   0 0  PHQ-9 Score 0   0 0    phq 9 is negative   Fall Risk:    02/01/2022    2:14 PM 10/14/2021    2:12 PM 09/15/2021   10:08 AM 08/15/2021    9:11 AM 05/26/2021   11:18 AM  Fall Risk   Falls in the past year? 1 0 0 0 Exclusion - non ambulatory  Number falls in past yr: 0 0 0 0   Injury with Fall? 0 0 0 0   Risk for fall due to : Impaired balance/gait;Impaired mobility Impaired balance/gait;Impaired mobility Impaired mobility Impaired mobility Impaired mobility;Impaired balance/gait  Follow up Falls prevention discussed;Education provided;Falls evaluation completed Falls prevention discussed Falls prevention discussed Falls prevention discussed Falls prevention discussed      Functional Status Survey: Is the patient deaf or have difficulty hearing?: No Does the patient have difficulty seeing, even when wearing glasses/contacts?: No Does the patient have difficulty concentrating, remembering, or making decisions?: Yes Does the patient have difficulty walking or climbing stairs?: Yes Does the patient have difficulty dressing or bathing?: Yes Does the patient have difficulty doing errands alone such as visiting a doctor's office or shopping?: Yes    Assessment & Plan  1. Atherosclerosis of aorta ([place])  On statin therapy   2. Pressure injury of left heel, stage 2 ([place])  Keep follow up with wound care  3. History of iron deficiency anemia   4. Need for immunization against influenza  - Flu Vaccine QUAD High  Dose(Fluad)  5. Nephrolithiasis  Under the care of Urologist   6. Mild major depression ([facility])  Doing better, gaining weight   7. Other cerebral palsy ([place])    Stable

## 2022-02-01 ENCOUNTER — Encounter: Payer: Self-pay | Admitting: Family Medicine

## 2022-02-01 ENCOUNTER — Ambulatory Visit (INDEPENDENT_AMBULATORY_CARE_PROVIDER_SITE_OTHER): Payer: Medicare Other | Admitting: Family Medicine

## 2022-02-01 VITALS — BP 112/62 | HR 60 | Temp 98.3°F | Resp 14 | Ht 73.0 in | Wt 145.4 lb

## 2022-02-01 DIAGNOSIS — Z862 Personal history of diseases of the blood and blood-forming organs and certain disorders involving the immune mechanism: Secondary | ICD-10-CM | POA: Diagnosis not present

## 2022-02-01 DIAGNOSIS — I700 Atherosclerosis of aorta: Secondary | ICD-10-CM | POA: Insufficient documentation

## 2022-02-01 DIAGNOSIS — G808 Other cerebral palsy: Secondary | ICD-10-CM

## 2022-02-01 DIAGNOSIS — Z23 Encounter for immunization: Secondary | ICD-10-CM | POA: Diagnosis not present

## 2022-02-01 DIAGNOSIS — L89622 Pressure ulcer of left heel, stage 2: Secondary | ICD-10-CM | POA: Diagnosis not present

## 2022-02-01 DIAGNOSIS — N200 Calculus of kidney: Secondary | ICD-10-CM

## 2022-02-01 DIAGNOSIS — F320 Major depressive disorder, single episode, mild: Secondary | ICD-10-CM

## 2022-02-01 NOTE — Patient Instructions (Signed)
He has calcaneal ulcers and at this time PT is not recommended We can resume home PT once the ulcers have completed healed

## 2022-02-02 ENCOUNTER — Other Ambulatory Visit: Payer: Self-pay | Admitting: Family Medicine

## 2022-02-02 DIAGNOSIS — E441 Mild protein-calorie malnutrition: Secondary | ICD-10-CM

## 2022-02-14 ENCOUNTER — Ambulatory Visit: Payer: Medicare Other | Admitting: Family Medicine

## 2022-02-16 ENCOUNTER — Encounter: Payer: Medicare Other | Attending: Physician Assistant | Admitting: Physician Assistant

## 2022-02-16 DIAGNOSIS — I872 Venous insufficiency (chronic) (peripheral): Secondary | ICD-10-CM | POA: Insufficient documentation

## 2022-02-16 DIAGNOSIS — L89623 Pressure ulcer of left heel, stage 3: Secondary | ICD-10-CM | POA: Insufficient documentation

## 2022-02-16 DIAGNOSIS — L89310 Pressure ulcer of right buttock, unstageable: Secondary | ICD-10-CM | POA: Diagnosis present

## 2022-02-16 DIAGNOSIS — G809 Cerebral palsy, unspecified: Secondary | ICD-10-CM | POA: Insufficient documentation

## 2022-02-16 DIAGNOSIS — I739 Peripheral vascular disease, unspecified: Secondary | ICD-10-CM | POA: Diagnosis not present

## 2022-02-16 NOTE — Progress Notes (Addendum)
JAEDEN, MESSER ([id number]) [id number] Page 1 of 11 Visit Report for 02/16/2022 Arrival Information Details Patient Name: Date of Service: [name] 02/16/2022 12:30 PM Medical Record Number: [id number] Patient Account Number: [id number] Date of Birth/Sex: Treating RN: [birth year] (81 y.o. [name]) Carlene Coria Primary Care Thedore Pickel: Steele Sizer Other Clinician: Referring Mickayla Trouten: Treating Tequila Rottmann/Extender: Osborne Oman Weeks in Treatment: 63 Visit Information History Since Last Visit All ordered tests and consults were completed: No Patient Arrived: Ambulatory Added or deleted any medications: No Arrival Time: 13:06 Any new allergies or adverse reactions: No Accompanied By: caregiver Had a fall or experienced change in No Transfer Assistance: None activities of daily living that may affect Patient Identification Verified: Yes risk of falls: Secondary Verification Process Completed: Yes Signs or symptoms of abuse/neglect since last visito No Patient Requires Transmission-Based Precautions: No Hospitalized since last visit: No Patient Has Alerts: No Implantable device outside of the clinic excluding No cellular tissue based products placed in the center since last visit: Has Dressing in Place as Prescribed: Yes Pain Present Now: No Electronic Signature(s) Signed: 02/16/2022 1:07:34 PM By: Carlene Coria RN Entered By: Carlene Coria on 02/16/2022 13:07:34 -------------------------------------------------------------------------------- Clinic Level of Care Assessment Details Patient Name: Date of Service: [name] 02/16/2022 12:30 PM Medical Record Number: [id number] Patient Account Number: [id number] Date of Birth/Sex: Treating RN: [birth year] (81 y.o. [name]) Carlene Coria Primary Care Jolynda Townley: Steele Sizer Other Clinician: Referring Katalyn Matin: Treating Gaige Fussner/Extender: Osborne Oman Weeks in  Treatment: 16 Clinic Level of Care Assessment Items TOOL 4 Quantity Score X- 1 0 Use when only an EandM is performed on FOLLOW-UP visit ASSESSMENTS - Nursing Assessment / Reassessment X- 1 10 Reassessment of Co-morbidities (includes updates in patient status) X- 1 5 Reassessment of Adherence to Treatment Plan [name], [name] ([id number]) [id number] Page 2 of 11 ASSESSMENTS - Wound and Skin A ssessment / Reassessment '[]'$  - 0 Simple Wound Assessment / Reassessment - one wound X- 3 5 Complex Wound Assessment / Reassessment - multiple wounds '[]'$  - 0 Dermatologic / Skin Assessment (not related to wound area) ASSESSMENTS - Focused Assessment '[]'$  - 0 Circumferential Edema Measurements - multi extremities '[]'$  - 0 Nutritional Assessment / Counseling / Intervention '[]'$  - 0 Lower Extremity Assessment (monofilament, tuning fork, pulses) '[]'$  - 0 Peripheral Arterial Disease Assessment (using hand held doppler) ASSESSMENTS - Ostomy and/or Continence Assessment and Care '[]'$  - 0 Incontinence Assessment and Management '[]'$  - 0 Ostomy Care Assessment and Management (repouching, etc.) PROCESS - Coordination of Care X - Simple Patient / Family Education for ongoing care 1 15 '[]'$  - 0 Complex (extensive) Patient / Family Education for ongoing care '[]'$  - 0 Staff obtains Programmer, systems, Records, T Results / Process Orders est '[]'$  - 0 Staff telephones HHA, Nursing Homes / Clarify orders / etc '[]'$  - 0 Routine Transfer to another Facility (non-emergent condition) '[]'$  - 0 Routine Hospital Admission (non-emergent condition) '[]'$  - 0 New Admissions / Biomedical engineer / Ordering NPWT Apligraf, etc. , '[]'$  - 0 Emergency Hospital Admission (emergent condition) X- 1 10 Simple Discharge Coordination '[]'$  - 0 Complex (extensive) Discharge Coordination PROCESS - Special Needs '[]'$  - 0 Pediatric / Minor Patient Management '[]'$  - 0 Isolation Patient Management '[]'$  - 0 Hearing / Language /  Visual special needs '[]'$  - 0 Assessment of Community assistance (transportation, D/C planning, etc.) '[]'$  - 0 Additional assistance / Altered mentation '[]'$  - 0 Support Surface(s) Assessment (bed, cushion, seat, etc.) INTERVENTIONS - Wound  Cleansing / Measurement '[]'$  - 0 Simple Wound Cleansing - one wound X- 3 5 Complex Wound Cleansing - multiple wounds X- 1 5 Wound Imaging (photographs - any number of wounds) '[]'$  - 0 Wound Tracing (instead of photographs) '[]'$  - 0 Simple Wound Measurement - one wound X- 3 5 Complex Wound Measurement - multiple wounds INTERVENTIONS - Wound Dressings X - Small Wound Dressing one or multiple wounds 1 10 '[]'$  - 0 Medium Wound Dressing one or multiple wounds '[]'$  - 0 Large Wound Dressing one or multiple wounds '[]'$  - 0 Application of Medications - topical '[]'$  - 0 Application of Medications - injection INTERVENTIONS - Miscellaneous '[]'$  - 0 External ear exam [name], [name] ([id number]) [phone].pdf Page 3 of 11 '[]'$  - 0 Specimen Collection (cultures, biopsies, blood, body fluids, etc.) '[]'$  - 0 Specimen(s) / Culture(s) sent or taken to Lab for analysis '[]'$  - 0 Patient Transfer (multiple staff / Harrel Lemon Lift / Similar devices) '[]'$  - 0 Simple Staple / Suture removal (25 or less) '[]'$  - 0 Complex Staple / Suture removal (26 or more) '[]'$  - 0 Hypo / Hyperglycemic Management (close monitor of Blood Glucose) '[]'$  - 0 Ankle / Brachial Index (ABI) - do not check if billed separately X- 1 5 Vital Signs Has the patient been seen at the hospital within the last three years: Yes Total Score: 105 Level Of Care: New/Established - Level 3 Electronic Signature(s) Signed: 02/16/2022 4:06:04 PM By: Carlene Coria RN Entered By: Carlene Coria on 02/16/2022 14:09:08 -------------------------------------------------------------------------------- Encounter Discharge Information Details Patient Name: Date of Service: Kevin Gauze M A. 02/16/2022 12:30  PM Medical Record Number: [id number] Patient Account Number: [id number] Date of Birth/Sex: Treating RN: June 16, 1940 (81 y.o. [name] Primary Care Prisma Decarlo: Steele Sizer Other Clinician: Referring Owen Pagnotta: Treating Keylor Rands/Extender: Osborne Oman Weeks in Treatment: 16 Encounter Discharge Information Items Discharge Condition: Stable Ambulatory Status: Wheelchair Discharge Destination: Home Transportation: Private Auto Accompanied By: caregiver Schedule Follow-up Appointment: Yes Clinical Summary of Care: Electronic Signature(s) Signed: 02/16/2022 2:10:11 PM By: Carlene Coria RN Entered By: Carlene Coria on 02/16/2022 14:10:11 Lower Extremity Assessment Details -------------------------------------------------------------------------------- [name] ([id number]) [id number] Page 4 of 11 Patient Name: Date of Service: [name] 02/16/2022 12:30 PM Medical Record Number: [id number] Patient Account Number: [id number] Date of Birth/Sex: Treating RN: [birth year] (81 y.o. [name]) Carlene Coria Primary Care Aimi Essner: Steele Sizer Other Clinician: Referring Zykera Abella: Treating Oday Ridings/Extender: Osborne Oman Weeks in Treatment: 16 Vascular Assessment Left: Right: Pulses: Dorsalis Pedis Palpable: Yes Yes Electronic Signature(s) Signed: 02/16/2022 1:11:16 PM By: Carlene Coria RN Previous Signature: 02/16/2022 1:10:45 PM Version By: Carlene Coria RN Entered By: Carlene Coria on 02/16/2022 13:11:15 -------------------------------------------------------------------------------- Multi Wound Chart Details Patient Name: Date of Service: Kevin Custard A. 02/16/2022 12:30 PM Medical Record Number: [id number] Patient Account Number: [id number] Date of Birth/Sex: Treating RN: [birth year] (81 y.o. [name]) Carlene Coria Primary Care Anthem Frazer: Steele Sizer Other Clinician: Referring Dashanti Burr: Treating  Deya Bigos/Extender: Osborne Oman Weeks in Treatment: 16 Vital Signs Height(in): 63 Pulse(bpm): 65 Weight(lbs): 135 Blood Pressure(mmHg): 92/51 Body Mass Index(BMI): 23.9 Temperature(F): 97.9 Respiratory Rate(breaths/min): 18 [1:Photos:] Left Calcaneus Right Gluteus Left Calcaneus Wound Location: Gradually Appeared Pressure Injury Pressure Injury Wounding Event: Pressure Ulcer Pressure Ulcer Pressure Ulcer Primary Etiology: 09/22/2021 11/07/2021 02/01/2022 Date Acquired: 16 10 0 Weeks of Treatment: Open Open Open Wound Status: No No No Wound Recurrence: 0x0x0 0x0x0 0.5x1x0.2 Measurements L x W x D (cm) 0 0 0.393 A (cm) :  rea 0 0 0.079 Volume (cm) : 100.00% 100.00% N/A % Reduction in A rea: 100.00% 100.00% N/A % Reduction in Volume: Category/Stage III Category/Stage III Category/Stage III Classification: None Present None Present Medium Exudate A mount: N/A N/A Serosanguineous Exudate Type: N/A N/A red, brown Exudate Color: None Present (0%) None Present (0%) Medium (34-66%) Granulation A mount: DASTAN, KRIDER ([id number]) [phone].pdf Page 5 of 11 N/A N/A Red, Pink Granulation Quality: None Present (0%) None Present (0%) Medium (34-66%) Necrotic Amount: Fascia: No Fascia: No Fat Layer (Subcutaneous Tissue): Yes Exposed Structures: Fat Layer (Subcutaneous Tissue): No Fat Layer (Subcutaneous Tissue): No Fascia: No Tendon: No Tendon: No Tendon: No Muscle: No Muscle: No Muscle: No Joint: No Joint: No Joint: No Bone: No Bone: No Bone: No Large (67-100%) Large (67-100%) N/A Epithelialization: Treatment Notes Electronic Signature(s) Signed: 02/16/2022 1:11:54 PM By: Carlene Coria RN Entered By: Carlene Coria on 02/16/2022 13:11:53 -------------------------------------------------------------------------------- Multi-Disciplinary Care Plan Details Patient Name: Date of Service: Kevin Gauze M A.  02/16/2022 12:30 PM Medical Record Number: [id number] Patient Account Number: [id number] Date of Birth/Sex: Treating RN: Jul 21, 1940 (81 y.o. [name] Primary Care Suellyn Meenan: Steele Sizer Other Clinician: Referring Cambell Rickenbach: Treating Xavior Niazi/Extender: Osborne Oman Weeks in Treatment: 16 Active Inactive Wound/Skin Impairment Nursing Diagnoses: Knowledge deficit related to ulceration/compromised skin integrity Goals: Patient/caregiver will verbalize understanding of skin care regimen Date Initiated: 10/27/2021 Target Resolution Date: 02/18/2022 Goal Status: Active Ulcer/skin breakdown will have a volume reduction of 30% by week 4 Date Initiated: 10/27/2021 Date Inactivated: 01/19/2022 Target Resolution Date: 11/27/2021 Goal Status: Unmet Unmet Reason: comordbities Ulcer/skin breakdown will have a volume reduction of 50% by week 8 Date Initiated: 10/27/2021 Date Inactivated: 01/19/2022 Target Resolution Date: 12/28/2021 Goal Status: Unmet Unmet Reason: comordbities Ulcer/skin breakdown will have a volume reduction of 80% by week 12 Date Initiated: 10/27/2021 Date Inactivated: 02/16/2022 Target Resolution Date: 01/27/2022 Goal Status: Unmet Unmet Reason: comorbdities Ulcer/skin breakdown will heal within 14 weeks Date Initiated: 10/27/2021 Target Resolution Date: 02/27/2022 Goal Status: Active Interventions: Assess patient/caregiver ability to obtain necessary supplies Assess patient/caregiver ability to perform ulcer/skin care regimen upon admission and as needed Assess ulceration(s) every visit Notes: Electronic Signature(s) Signed: 02/16/2022 1:11:41 PM By: Carlene Coria RN Entered By: Carlene Coria on 02/16/2022 13:11:41 [name] ([id number]) [id number] Page 6 of 11 -------------------------------------------------------------------------------- Pain Assessment Details Patient Name: Date of Service: [name]  02/16/2022 12:30 PM Medical Record Number: [id number] Patient Account Number: [id number] Date of Birth/Sex: Treating RN: 1940-10-12 (81 y.o. [name]) Carlene Coria Primary Care Briyanna Billingham: Steele Sizer Other Clinician: Referring Breda Bond: Treating Monique Gift/Extender: Osborne Oman Weeks in Treatment: 16 Active Problems Location of Pain Severity and Description of Pain Patient Has Paino No Site Locations Pain Management and Medication Current Pain Management: Electronic Signature(s) Signed: 02/16/2022 1:08:05 PM By: Carlene Coria RN Entered By: Carlene Coria on 02/16/2022 13:08:05 -------------------------------------------------------------------------------- Patient/Caregiver Education Details Patient Name: Date of Service: [name] 10/26/2023andnbsp12:30 PM Medical Record Number: [id number] Patient Account Number: [id number] Date of Birth/Gender: Treating RN: 03/28/1941 (81 y.o. [name] Primary Care Physician: Steele Sizer Other Clinician: Referring Physician: Treating Physician/Extender: Roxanne Mins ([id number]) [id number] Page 7 of 11 Weeks in Treatment: 16 Education Assessment Education Provided To: Patient Education Topics Provided Wound/Skin Impairment: Methods: Explain/Verbal Responses: State content correctly [employer]) Signed: 02/16/2022 4:06:04 PM By: Carlene Coria RN Entered By: Carlene Coria on 02/16/2022 14:09:26 -------------------------------------------------------------------------------- Wound Assessment Details Patient Name: Date of Service:  A Florinda Marker A. 02/16/2022 12:30 PM Medical Record Number: [id number] Patient Account Number: [id number] Date of Birth/Sex: Treating RN: 05-27-1940 (81 y.o. [name]) Carlene Coria Primary Care Lenox Ladouceur: Steele Sizer Other Clinician: Referring Khaya Theissen: Treating Wallis Spizzirri/Extender: Osborne Oman Weeks  in Treatment: 16 Wound Status Wound Number: 1 Primary Etiology: Pressure Ulcer Wound Location: Left Calcaneus Wound Status: Open Wounding Event: Gradually Appeared Date Acquired: 09/22/2021 Weeks Of Treatment: 16 Clustered Wound: No Photos Wound Measurements Length: (cm) Width: (cm) Depth: (cm) Area: (cm) Volume: (cm) 0 % Reduction in Area: 100% 0 % Reduction in Volume: 100% 0 Epithelialization: Large (67-100%) 0 Tunneling: No 0 Undermining: No Wound Description Classification: Category/Stage III Exudate Amount: None Present [name], [name] ([id number]) Foul Odor After Cleansing: No Slough/Fibrino No [phone].pdf Page 8 of 11 Wound Bed Granulation Amount: None Present (0%) Exposed Structure Necrotic Amount: None Present (0%) Fascia Exposed: No Fat Layer (Subcutaneous Tissue) Exposed: No Tendon Exposed: No Muscle Exposed: No Joint Exposed: No Bone Exposed: No Electronic Signature(s) Signed: 02/16/2022 1:08:51 PM By: Carlene Coria RN Entered By: Carlene Coria on 02/16/2022 13:08:51 -------------------------------------------------------------------------------- Wound Assessment Details Patient Name: Date of Service: Kevin Custard A. 02/16/2022 12:30 PM Medical Record Number: [id number] Patient Account Number: [id number] Date of Birth/Sex: Treating RN: 1940-07-23 (81 y.o. [name]) Carlene Coria Primary Care [place] Spainhower: Steele Sizer Other Clinician: Referring Izick Gasbarro: Treating Lacresha Fusilier/Extender: Osborne Oman Weeks in Treatment: 16 Wound Status Wound Number: 2 Primary Etiology: Pressure Ulcer Wound Location: Right Gluteus Wound Status: Open Wounding Event: Pressure Injury Date Acquired: 11/07/2021 Weeks Of Treatment: 10 Clustered Wound: No Photos Wound Measurements Length: (cm) Width: (cm) Depth: (cm) Area: (cm) Volume: (cm) 0 % Reduction in Area: 100% 0 % Reduction in Volume: 100% 0 Epithelialization: Large  (67-100%) 0 Tunneling: No 0 Undermining: No Wound Description Classification: Category/Stage III Exudate Amount: None Present Foul Odor After Cleansing: No Slough/Fibrino No Wound Bed [name], [name] ([id number]) [phone].pdf Page 9 of 11 Granulation Amount: None Present (0%) Exposed Structure Necrotic Amount: None Present (0%) Fascia Exposed: No Fat Layer (Subcutaneous Tissue) Exposed: No Tendon Exposed: No Muscle Exposed: No Joint Exposed: No Bone Exposed: No Electronic Signature(s) Signed: 02/16/2022 1:09:20 PM By: Carlene Coria RN Entered By: Carlene Coria on 02/16/2022 13:09:20 -------------------------------------------------------------------------------- Wound Assessment Details Patient Name: Date of Service: Kevin Custard A. 02/16/2022 12:30 PM Medical Record Number: [id number] Patient Account Number: [id number] Date of Birth/Sex: Treating RN: February 21, 1941 (81 y.o. [name]) Carlene Coria Primary Care Seda Kronberg: Steele Sizer Other Clinician: Referring Mckynleigh Mussell: Treating Sejla Marzano/Extender: Osborne Oman Weeks in Treatment: 16 Wound Status Wound Number: 3 Primary Etiology: Pressure Ulcer Wound Location: Left Calcaneus Wound Status: Open Wounding Event: Pressure Injury Date Acquired: 02/01/2022 Weeks Of Treatment: 0 Clustered Wound: No Photos Wound Measurements Length: (cm) 0.5 Width: (cm) 1 Depth: (cm) 0.2 Area: (cm) 0.393 Volume: (cm) 0.079 % Reduction in Area: % Reduction in Volume: Tunneling: No Undermining: No Wound Description Classification: Category/Stage III Exudate Amount: Medium Exudate Type: Serosanguineous Exudate Color: red, brown Foul Odor After Cleansing: No Slough/Fibrino Yes Wound Bed Granulation Amount: Medium (34-66%) Exposed Structure Granulation Quality: Red, Pink Fascia Exposed: No [name], [name] ([id number]) [id number] Page 10 of 11 Necrotic Amount: Medium  (34-66%) Fat Layer (Subcutaneous Tissue) Exposed: Yes Necrotic Quality: Adherent Slough Tendon Exposed: No Muscle Exposed: No Joint Exposed: No Bone Exposed: No Treatment Notes Wound #3 (Calcaneus) Wound Laterality: Left Cleanser Soap and Water Discharge Instruction: Gently cleanse wound with antibacterial soap, rinse and pat dry prior  to dressing wounds Peri-Wound Care Topical Primary Dressing Silvercel Small 2x2 (in/in) Discharge Instruction: Apply Silvercel Small 2x2 (in/in) as instructed Secondary Dressing ABD Pad 5x9 (in/in) Discharge Instruction: Cover with ABD pad Kerlix 4.5 x 4.1 (in/yd) Discharge Instruction: Apply Kerlix 4.5 x 4.1 (in/yd) as instructed Secured With Holley H Soft Cloth Surgical T ape ape, 2x2 (in/yd) Compression Wrap Compression Stockings Add-Ons Electronic Signature(s) Signed: 02/16/2022 1:10:33 PM By: Carlene Coria RN Entered By: Carlene Coria on 02/16/2022 13:10:33 -------------------------------------------------------------------------------- Vitals Details Patient Name: Date of Service: Kevin Gauze M A. 02/16/2022 12:30 PM Medical Record Number: [id number] Patient Account Number: [id number] Date of Birth/Sex: Treating RN: 1940/08/09 (81 y.o. [name] Primary Care Shimshon Narula: Steele Sizer Other Clinician: Referring Jeanie Mccard: Treating Riva Sesma/Extender: Osborne Oman Weeks in Treatment: 16 Vital Signs Time Taken: 13:00 Temperature (F): 97.9 Height (in): 63 Pulse (bpm): 65 Weight (lbs): 135 Respiratory Rate (breaths/min): 18 Body Mass Index (BMI): 23.9 Blood Pressure (mmHg): 92/51 Reference Range: 80 - 120 mg / dl Electronic Signature(s) Signed: 02/16/2022 1:07:59 PM By: Carlene Coria RN Entered By: Carlene Coria on 02/16/2022 13:07:59 [name] ([id number]) [id number] Page 11 of 11

## 2022-02-16 NOTE — Progress Notes (Addendum)
[name], [name] ([id number]) [id number] Page 1 of 7 Visit Report for 02/16/2022 Chief Complaint Document Details Patient Name: Date of Service: [name] 02/16/2022 12:30 PM Medical Record Number: [id number] Patient Account Number: [id number] Date of Birth/Sex: Treating RN: [birth year] (81 y.o. [name]) Carlene Coria Primary Care Provider: Steele Sizer Other Clinician: Referring Provider: Treating Provider/Extender: Osborne Oman Weeks in Treatment: 16 Information Obtained from: Patient Chief Complaint Left heel pressure ulcer and right buttock pressure ulcer Electronic Signature(s) Signed: 02/16/2022 1:06:13 PM By: Worthy Keeler PA-C Entered By: Worthy Keeler on 02/16/2022 13:06:13 -------------------------------------------------------------------------------- HPI Details Patient Name: Date of Service: Kevin Custard A. 02/16/2022 12:30 PM Medical Record Number: [id number] Patient Account Number: [id number] Date of Birth/Sex: Treating RN: 07-20-1940 (81 y.o. [name] Primary Care Provider: Steele Sizer Other Clinician: Referring Provider: Treating Provider/Extender: Osborne Oman Weeks in Treatment: 16 History of Present Illness HPI Description: 10-27-2021 upon evaluation today patient presents for initial evaluation here in the clinic concerning an issue that has been present since around June 1. Subsequently the patient's caregiver from [name] tells me that he has been treated mainly by herself she has been doing what she can to try to take care of this using DuoDERM over the area. There is a lot of skin that is actually loosen up over the top of the heel surface but not all of this is necessarily covering wound in fact there is a majority of this that is not wound related as far as the skin over top is concerned. Fortunately I do not see any signs of active infection locally or systemically at  this time which is great news. He does have some slough over the surface of the wound but mainly the skin around the edges the thing I am most concerned with. It is trapping a lot of fluid to be honest. Patient does have a history of chronic venous insufficiency, cerebral palsy, and stated peripheral vascular disease. With that being said I do not see any evidence right now of active infection which is great news. No fevers, chills, nausea, vomiting, or diarrhea. 11-10-2021 upon evaluation today patient appears to be doing well currently in regard to his wound on the heel. Fortunately there does not appear to be any signs of active infection locally or systemically at this time which is great news. Fortunately I do not see any evidence of infection which is also excellent. No fevers, chills, nausea, vomiting, or diarrhea. 12-08-2021 upon evaluation today patient appears to be doing well currently in regard to his heel although unfortunately has a right gluteal pressure ulcer. Fortunately there does not appear to be any signs of active infection locally or systemically which is great news and overall I am extremely pleased with where we stand today. [name], [name] ([id number]) [id number] Page 2 of 7 12-29-2021 upon evaluation patient's heel as well as gluteal area both appear to be doing better. There does not appear to be any signs of active infection locally or systemically at this time which is great news. No fevers, chills, nausea, vomiting, or diarrhea. 01-19-2022 upon evaluation today patient appears to be doing well with regard to his wound. He has been tolerating the dressing changes without complication. Fortunately there does not appear to be any evidence of active infection locally or systemically at this time which is great news. No fevers, chills, nausea, vomiting, or diarrhea. 02-16-2022 upon evaluation today patient appears to be doing well  currently in regard  to his wounds in general everything is healed although he has a new wound on the left heel which is something that was not present previous. Again this is a little disconcerting especially considering the other wounds have completely closed and we were hoping he be ready for discharge today. Again I do believe that this is something that looks like a pressure injury stage III. Electronic Signature(s) Signed: 02/16/2022 4:52:14 PM By: Worthy Keeler PA-C Entered By: Worthy Keeler on 02/16/2022 16:52:13 -------------------------------------------------------------------------------- Physical Exam Details Patient Name: Date of Service: [name] 02/16/2022 12:30 PM Medical Record Number: [id number] Patient Account Number: [id number] Date of Birth/Sex: Treating RN: 10-05-1940 (81 y.o. [name] Primary Care Provider: Steele Sizer Other Clinician: Referring Provider: Treating Provider/Extender: Osborne Oman Weeks in Treatment: 61 Constitutional Well-nourished and well-hydrated in no acute distress. Respiratory normal breathing without difficulty. Psychiatric this patient is able to make decisions and demonstrates good insight into disease process. Alert and Oriented x 3. pleasant and cooperative. Notes Upon inspection patient's wound bed actually showed signs of good granulation and epithelization at this point. Fortunately I do not see any signs of infection locally nor systemically which is great news and overall I am extremely pleased with where things stand today. No sharp debridement was necessary. Electronic Signature(s) Signed: 02/16/2022 4:52:56 PM By: Worthy Keeler PA-C Previous Signature: 02/16/2022 4:52:44 PM Version By: Worthy Keeler PA-C Entered By: Worthy Keeler on 02/16/2022 16:52:56 -------------------------------------------------------------------------------- Physician Orders Details Patient Name: Date of Service: Kevin Custard A. 02/16/2022 12:30 PM Medical Record Number: [id number] Patient Account Number: [id number] Date of Birth/Sex: Treating RN: Jun 03, 1940 (81 y.o. [name] Primary Care Provider: Steele Sizer Other Clinician: [name] ([id number]) [id number] Page 3 of 7 Referring Provider: Treating Provider/Extender: Rogelio Seen in Treatment: 16 Verbal / Phone Orders: No Diagnosis Coding ICD-10 Coding Code Description [phone] Pressure ulcer of left heel, stage 3 L89.310 Pressure ulcer of right buttock, unstageable G80.8 Other cerebral palsy I73.89 Other specified peripheral vascular diseases I87.393 Chronic venous hypertension (idiopathic) with other complications of bilateral lower extremity Follow-up Appointments Return Appointment in 1 month Bathing/ Shower/ Hygiene May shower; gently cleanse wound with antibacterial soap, rinse and pat dry prior to dressing wounds Anesthetic (Use 'Patient Medications' Section for Anesthetic Order Entry) Lidocaine applied to wound bed Non-Wound Condition dditional non-wound orders/instructions: - clean buttocks with soap and water , pat dry , apply calmoseptine ointment after each incontent A episode Off-Loading Roho cushion for wheelchair Hospital bed/mattress Gel mattress overlay (Group 1) Other: - prevalon boots to bi lat feet / legs at all times to protect heels Wound Treatment Wound #3 - Calcaneus Wound Laterality: Left Cleanser: Soap and Water 3 x Per Week/30 Days Discharge Instructions: Gently cleanse wound with antibacterial soap, rinse and pat dry prior to dressing wounds Prim Dressing: Silvercel Small 2x2 (in/in) 3 x Per Week/30 Days ary Discharge Instructions: Apply Silvercel Small 2x2 (in/in) as instructed Secondary Dressing: ABD Pad 5x9 (in/in) 3 x Per Week/30 Days Discharge Instructions: Cover with ABD pad Secondary Dressing: Kerlix 4.5 x 4.1 (in/yd) 3 x Per Week/30  Days Discharge Instructions: Apply Kerlix 4.5 x 4.1 (in/yd) as instructed Secured With: Medipore T - 36M Medipore H Soft Cloth Surgical T ape ape, 2x2 (in/yd) 3 x Per Week/30 Days Electronic Signature(s) Signed: 02/16/2022 1:38:53 PM By: Carlene Coria RN Signed: 02/16/2022 5:00:04 PM By: Worthy Keeler PA-C Entered  ByCarlene Coria on 02/16/2022 13:38:53 -------------------------------------------------------------------------------- Problem List Details Patient Name: Date of Service: [name] 02/16/2022 12:30 PM Medical Record Number: [id number] Patient Account Number: [id number] Date of Birth/Sex: Treating RN: 1940/06/19 (81 y.o. [name]) Arty, Lantzy A ([id number]) [phone].pdf Page 4 of 7 Primary Care Provider: Steele Sizer Other Clinician: Referring Provider: Treating Provider/Extender: Osborne Oman Weeks in Treatment: 16 Active Problems ICD-10 Encounter Code Description Active Date MDM Diagnosis L89.623 Pressure ulcer of left heel, stage 3 10/27/2021 No Yes L89.310 Pressure ulcer of right buttock, unstageable 12/08/2021 No Yes G80.8 Other cerebral palsy 10/27/2021 No Yes I73.89 Other specified peripheral vascular diseases 10/27/2021 No Yes I87.393 Chronic venous hypertension (idiopathic) with other complications of bilateral 10/27/2021 No Yes lower extremity Inactive Problems Resolved Problems Electronic Signature(s) Signed: 02/16/2022 1:06:08 PM By: Worthy Keeler PA-C Entered By: Worthy Keeler on 02/16/2022 13:06:08 -------------------------------------------------------------------------------- Progress Note Details Patient Name: Date of Service: Kevin Custard A. 02/16/2022 12:30 PM Medical Record Number: [id number] Patient Account Number: [id number] Date of Birth/Sex: Treating RN: 06-03-1940 (81 y.o. [name] Primary Care Provider: Steele Sizer Other Clinician: Referring Provider: Treating  Provider/Extender: Osborne Oman Weeks in Treatment: 16 Subjective Chief Complaint Information obtained from Patient Left heel pressure ulcer and right buttock pressure ulcer History of Present Illness (HPI) 10-27-2021 upon evaluation today patient presents for initial evaluation here in the clinic concerning an issue that has been present since around June 1. Subsequently the patient's caregiver from [name] tells me that he has been treated mainly by herself she has been doing what she can to try to take care of this using DuoDERM over the area. There is a lot of skin that is actually loosen up over the top of the heel surface but not all of this is necessarily covering wound in fact there is a majority of this that is not wound related as far as the skin over top is concerned. Fortunately I do not see any signs of active infection locally or systemically at this time which is great news. He does have some slough over the surface of the wound but mainly the skin around the edges the thing I am most concerned with. It is trapping a lot of fluid to be honest. [name], [name] ([id number]) [id number] Page 5 of 7 Patient does have a history of chronic venous insufficiency, cerebral palsy, and stated peripheral vascular disease. With that being said I do not see any evidence right now of active infection which is great news. No fevers, chills, nausea, vomiting, or diarrhea. 11-10-2021 upon evaluation today patient appears to be doing well currently in regard to his wound on the heel. Fortunately there does not appear to be any signs of active infection locally or systemically at this time which is great news. Fortunately I do not see any evidence of infection which is also excellent. No fevers, chills, nausea, vomiting, or diarrhea. 12-08-2021 upon evaluation today patient appears to be doing well currently in regard to his heel although unfortunately has  a right gluteal pressure ulcer. Fortunately there does not appear to be any signs of active infection locally or systemically which is great news and overall I am extremely pleased with where we stand today. 12-29-2021 upon evaluation patient's heel as well as gluteal area both appear to be doing better. There does not appear to be any signs of active infection locally or systemically at this time which is great news.  No fevers, chills, nausea, vomiting, or diarrhea. 01-19-2022 upon evaluation today patient appears to be doing well with regard to his wound. He has been tolerating the dressing changes without complication. Fortunately there does not appear to be any evidence of active infection locally or systemically at this time which is great news. No fevers, chills, nausea, vomiting, or diarrhea. 02-16-2022 upon evaluation today patient appears to be doing well currently in regard to his wounds in general everything is healed although he has a new wound on the left heel which is something that was not present previous. Again this is a little disconcerting especially considering the other wounds have completely closed and we were hoping he be ready for discharge today. Again I do believe that this is something that looks like a pressure injury stage III. Objective Constitutional Well-nourished and well-hydrated in no acute distress. Vitals Time Taken: 1:00 PM, Height: 63 in, Weight: 135 lbs, BMI: 23.9, Temperature: 97.9 F, Pulse: 65 bpm, Respiratory Rate: 18 breaths/min, Blood Pressure: 92/51 mmHg. Respiratory normal breathing without difficulty. Psychiatric this patient is able to make decisions and demonstrates good insight into disease process. Alert and Oriented x 3. pleasant and cooperative. General Notes: Upon inspection patient's wound bed actually showed signs of good granulation and epithelization at this point. Fortunately I do not see any signs of infection locally nor systemically  which is great news and overall I am extremely pleased with where things stand today. No sharp debridement was necessary. Integumentary (Hair, Skin) Wound #1 status is Open. Original cause of wound was Gradually Appeared. The date acquired was: 09/22/2021. The wound has been in treatment 16 weeks. The wound is located on the Left Calcaneus. The wound measures 0cm length x 0cm width x 0cm depth; 0cm^2 area and 0cm^3 volume. There is no tunneling or undermining noted. There is a none present amount of drainage noted. There is no granulation within the wound bed. There is no necrotic tissue within the wound bed. Wound #2 status is Open. Original cause of wound was Pressure Injury. The date acquired was: 11/07/2021. The wound has been in treatment 10 weeks. The wound is located on the Right Gluteus. The wound measures 0cm length x 0cm width x 0cm depth; 0cm^2 area and 0cm^3 volume. There is no tunneling or undermining noted. There is a none present amount of drainage noted. There is no granulation within the wound bed. There is no necrotic tissue within the wound bed. Wound #3 status is Open. Original cause of wound was Pressure Injury. The date acquired was: 02/01/2022. The wound is located on the Left Calcaneus. The wound measures 0.5cm length x 1cm width x 0.2cm depth; 0.393cm^2 area and 0.079cm^3 volume. There is Fat Layer (Subcutaneous Tissue) exposed. There is no tunneling or undermining noted. There is a medium amount of serosanguineous drainage noted. There is medium (34-66%) red, pink granulation within the wound bed. There is a medium (34-66%) amount of necrotic tissue within the wound bed including Adherent Slough. Assessment Active Problems ICD-10 Pressure ulcer of left heel, stage 3 Pressure ulcer of right buttock, unstageable Other cerebral palsy Other specified peripheral vascular diseases Chronic venous hypertension (idiopathic) with other complications of bilateral lower  extremity Plan [name], [name] ([id number]) [id number] Page 6 of 7 Follow-up Appointments: Return Appointment in 1 month Bathing/ Shower/ Hygiene: May shower; gently cleanse wound with antibacterial soap, rinse and pat dry prior to dressing wounds Anesthetic (Use 'Patient Medications' Section for Anesthetic Order Entry): Lidocaine applied to wound bed Non-Wound  Condition: Additional non-wound orders/instructions: - clean buttocks with soap and water , pat dry , apply calmoseptine ointment after each incontent episode Off-Loading: Roho cushion for wheelchair Hospital bed/mattress Gel mattress overlay (Group 1) Other: - prevalon boots to bi lat feet / legs at all times to protect heels WOUND #3: - Calcaneus Wound Laterality: Left Cleanser: Soap and Water 3 x Per Week/30 Days Discharge Instructions: Gently cleanse wound with antibacterial soap, rinse and pat dry prior to dressing wounds Prim Dressing: Silvercel Small 2x2 (in/in) 3 x Per Week/30 Days ary Discharge Instructions: Apply Silvercel Small 2x2 (in/in) as instructed Secondary Dressing: ABD Pad 5x9 (in/in) 3 x Per Week/30 Days Discharge Instructions: Cover with ABD pad Secondary Dressing: Kerlix 4.5 x 4.1 (in/yd) 3 x Per Week/30 Days Discharge Instructions: Apply Kerlix 4.5 x 4.1 (in/yd) as instructed Secured With: Medipore T - 5M Medipore H Soft Cloth Surgical T ape ape, 2x2 (in/yd) 3 x Per Week/30 Days 1. I would recommend that we have the patient continue to monitor for any signs of worsening or infection. Obviously if anything changes he knows to contact the office and let me know. 2. I am also can recommend that we have him continue with the silver alginate dressing in particular in the ABD pads to cover. 3. I think it is of utmost importance that the facility continue to utilize the Prevalon offloading boots these are of utmost importance as far as helping the area to heal effectively and  quickly. The patient voiced understanding and his caregiver was present today also agrees that that needs to be done regularly. We will see patient back for reevaluation in 2 weeks here in the clinic. If anything worsens or changes patient will contact our office for additional recommendations. Electronic Signature(s) Signed: 02/16/2022 4:53:53 PM By: Worthy Keeler PA-C Entered By: Worthy Keeler on 02/16/2022 16:53:53 -------------------------------------------------------------------------------- SuperBill Details Patient Name: Date of Service: Kevin Custard A. 02/16/2022 Medical Record Number: [id number] Patient Account Number: [id number] Date of Birth/Sex: Treating RN: 1941/04/03 (81 y.o. [name]) Carlene Coria Primary Care Provider: Steele Sizer Other Clinician: Referring Provider: Treating Provider/Extender: Osborne Oman Weeks in Treatment: 16 Diagnosis Coding ICD-10 Codes Code Description [id number] Pressure ulcer of left heel, stage 3 L89.310 Pressure ulcer of right buttock, unstageable G80.8 Other cerebral palsy I73.89 Other specified peripheral vascular diseases I87.393 Chronic venous hypertension (idiopathic) with other complications of bilateral lower extremity Facility Procedures Physician Procedures : CPT4 Code Description Modifier [id number] - WC PHYS LEVEL 3 - EST PT ICD-10 Diagnosis Description L89.623 Pressure ulcer of left heel, stage 3 L89.310 Pressure ulcer of right buttock, unstageable G80.8 Other cerebral palsy I73.89 Other specified  peripheral vascular diseases Quantity: 1 Electronic Signature(s) Signed: 02/16/2022 4:54:39 PM By: Worthy Keeler PA-C Previous Signature: 02/16/2022 2:09:15 PM Version By: Carlene Coria RN Entered By: Worthy Keeler on 02/16/2022 16:54:39

## 2022-02-20 ENCOUNTER — Other Ambulatory Visit: Payer: Medicare Other

## 2022-02-20 ENCOUNTER — Ambulatory Visit: Payer: Medicare Other | Admitting: Oncology

## 2022-02-28 ENCOUNTER — Encounter: Payer: Self-pay | Admitting: Internal Medicine

## 2022-02-28 ENCOUNTER — Ambulatory Visit (INDEPENDENT_AMBULATORY_CARE_PROVIDER_SITE_OTHER): Payer: Medicare Other | Admitting: Internal Medicine

## 2022-02-28 VITALS — BP 110/72 | HR 59 | Resp 18 | Ht 73.0 in

## 2022-02-28 DIAGNOSIS — R400 Somnolence: Secondary | ICD-10-CM

## 2022-02-28 DIAGNOSIS — R7981 Abnormal blood-gas level: Secondary | ICD-10-CM | POA: Diagnosis not present

## 2022-02-28 LAB — CBC WITH DIFFERENTIAL/PLATELET
Absolute Monocytes: 566 cells/uL (ref 200–950)
Basophils Absolute: 22 cells/uL (ref 0–200)
Basophils Relative: 0.4 %
Eosinophils Absolute: 123 cells/uL (ref 15–500)
Eosinophils Relative: 2.2 %
HCT: 36.5 % — ABNORMAL LOW (ref 38.5–50.0)
Hemoglobin: 13.1 g/dL — ABNORMAL LOW (ref 13.2–17.1)
Lymphs Abs: 1781 cells/uL (ref 850–3900)
MCH: 33.9 pg — ABNORMAL HIGH (ref 27.0–33.0)
MCHC: 35.9 g/dL (ref 32.0–36.0)
MCV: 94.6 fL (ref 80.0–100.0)
MPV: 10.2 fL (ref 7.5–12.5)
Monocytes Relative: 10.1 %
Neutro Abs: 3108 cells/uL (ref 1500–7800)
Neutrophils Relative %: 55.5 %
Platelets: 135 10*3/uL — ABNORMAL LOW (ref 140–400)
RBC: 3.86 10*6/uL — ABNORMAL LOW (ref 4.20–5.80)
RDW: 12.2 % (ref 11.0–15.0)
Total Lymphocyte: 31.8 %
WBC: 5.6 10*3/uL (ref 3.8–10.8)

## 2022-02-28 LAB — COMPLETE METABOLIC PANEL WITH GFR
AG Ratio: 1.4 (calc) (ref 1.0–2.5)
ALT: 17 U/L (ref 9–46)
AST: 16 U/L (ref 10–35)
Albumin: 3.9 g/dL (ref 3.6–5.1)
Alkaline phosphatase (APISO): 129 U/L (ref 35–144)
BUN/Creatinine Ratio: 55 (calc) — ABNORMAL HIGH (ref 6–22)
BUN: 35 mg/dL — ABNORMAL HIGH (ref 7–25)
CO2: 28 mmol/L (ref 20–32)
Calcium: 9.1 mg/dL (ref 8.6–10.3)
Chloride: 106 mmol/L (ref 98–110)
Creat: 0.64 mg/dL — ABNORMAL LOW (ref 0.70–1.28)
Globulin: 2.8 g/dL (calc) (ref 1.9–3.7)
Glucose, Bld: 87 mg/dL (ref 65–99)
Potassium: 4.3 mmol/L (ref 3.5–5.3)
Sodium: 141 mmol/L (ref 135–146)
Total Bilirubin: 0.5 mg/dL (ref 0.2–1.2)
Total Protein: 6.7 g/dL (ref 6.1–8.1)
eGFR: 96 mL/min/{1.73_m2} (ref >=60)

## 2022-02-28 NOTE — Progress Notes (Signed)
Acute Office Visit  Subjective:     Patient ID: [name], male    DOB: May 25, 1940, 81 y.o.   MRN: [id number]  Chief Complaint  Patient presents with   low oxygen    HPI Patient is in today for oxygen concern. Patient lives at a group home and his care giver is here with him today to provide the history. Caregiver states taht today was a busy day at the group home, as the psychiatrist came to do rounds on the patients. Bill was up earlier than normal and had told a few people that he didn't feel good but did not elaborate. Oxygen level this morning was 83% and BP 90/80 according to his caregiver. This is abnormal for him. Heart rate was 57-62. Patient denies pain, no coughing, nausea/vomiting, pain with urination. He does follow with Urology and was treated with Cipro in September. He finished this without issues. No fevers. Appetite is good. He is having normal bowel movements. He does have one small heel wound on the right but is seeing wound care for this, was just seen last week. No other ulcers or wounds present.   Review of Systems  Constitutional:  Negative for chills and fever.  Respiratory:  Negative for cough, shortness of breath and wheezing.   Cardiovascular:  Negative for chest pain.  Gastrointestinal:  Negative for abdominal pain, constipation, diarrhea, nausea and vomiting.  Genitourinary:  Negative for dysuria and hematuria.       Objective:    BP 110/72   Pulse (!) 59   Resp 18   Ht '6\' 1"'$  (1.854 m)   SpO2 97%   BMI 19.18 kg/m  BP Readings from Last 3 Encounters:  02/28/22 110/72  02/01/22 112/62  01/05/22 (!) 112/59   Wt Readings from Last 3 Encounters:  02/01/22 145 lb 6.4 oz (66 kg)  10/19/21 130 lb (59 kg)  10/04/21 140 lb (63.5 kg)      Physical Exam Constitutional:      Comments: Appears somnolent, not answering questions fully   HENT:     Head: Normocephalic and atraumatic.     Mouth/Throat:     Mouth: Mucous membranes are moist.      Pharynx: Oropharynx is clear.  Eyes:     Conjunctiva/sclera: Conjunctivae normal.  Cardiovascular:     Rate and Rhythm: Normal rate and regular rhythm.  Pulmonary:     Effort: Pulmonary effort is normal.     Breath sounds: Normal breath sounds. No wheezing, rhonchi or rales.  Abdominal:     General: There is no distension.     Palpations: Abdomen is soft.     Tenderness: There is no abdominal tenderness.  Skin:    General: Skin is warm and dry.     Capillary Refill: Capillary refill takes less than 2 seconds.  Neurological:     General: No focal deficit present.     Mental Status: He is alert. Mental status is at baseline.  Psychiatric:        Mood and Affect: Mood normal.        Behavior: Behavior normal.     No results found for any visits on 02/28/22.      Assessment & Plan:   1. Somnolence/Low oxygen saturation: Patient is acting sleepy here today, will start to answer a question but not finish his thought which is abnormal for him. Denies pain or obvious issues. Will obtain CBC, CMP, test for COVID and urine today. Vitals  here reassuring but discussed how if his BP is <90/60 or is pulse ox < 85% and not resolving he needs to be evaluated in the ER. Caregiver voices understanding.   - CBC w/Diff/Platelet - COMPLETE METABOLIC PANEL WITH GFR - Novel Coronavirus, NAA ([facility]) - POCT Urinalysis Dipstick  Return for already scheduled .  Teodora Medici, DO

## 2022-02-28 NOTE — Patient Instructions (Addendum)
It was great seeing you today!  Plan discussed at today's visit: -Blood work ordered today, results will be uploaded to MyChart.  -Urine test and COVID test today -If blood pressure lower than 90/60 he needs to go to the ER for further evaluation. If pulse ox less than 85% and not resolving, he needs to go to the ER.   Follow up in: already scheduled  Take care and let us know if you have any questions or concerns prior to your next visit.  Dr. Rosana Berger

## 2022-03-01 LAB — SPECIMEN STATUS REPORT

## 2022-03-01 LAB — NOVEL CORONAVIRUS, NAA: SARS-CoV-2, NAA: NOT DETECTED

## 2022-03-14 ENCOUNTER — Encounter: Payer: Medicare Other | Attending: Physician Assistant | Admitting: Physician Assistant

## 2022-03-14 DIAGNOSIS — L89623 Pressure ulcer of left heel, stage 3: Secondary | ICD-10-CM | POA: Insufficient documentation

## 2022-03-14 DIAGNOSIS — I739 Peripheral vascular disease, unspecified: Secondary | ICD-10-CM | POA: Diagnosis not present

## 2022-03-14 DIAGNOSIS — L89310 Pressure ulcer of right buttock, unstageable: Secondary | ICD-10-CM | POA: Diagnosis present

## 2022-03-14 DIAGNOSIS — G809 Cerebral palsy, unspecified: Secondary | ICD-10-CM | POA: Insufficient documentation

## 2022-03-14 DIAGNOSIS — I872 Venous insufficiency (chronic) (peripheral): Secondary | ICD-10-CM | POA: Diagnosis not present

## 2022-03-14 NOTE — Progress Notes (Signed)
VALON, GLASSCOCK ([id number]) [id number] Page 1 of 13 Visit Report for 03/14/2022 Arrival Information Details Patient Name: Date of Service: Kevin Custard A. 03/14/2022 10:00 A M Medical Record Number: [id number] Patient Account Number: [id number] Date of Birth/Sex: Treating RN: 1940/07/18 (81 y.o. Seward Meth Primary Care Eithel Ryall: Steele Sizer Other Clinician: Referring Shamona Wirtz: Treating Wade Sigala/Extender: Osborne Oman Weeks in Treatment: 19 Visit Information History Since Last Visit Added or deleted any medications: No Patient Arrived: Wheel Chair Any new allergies or adverse reactions: No Arrival Time: 10:30 Had a fall or experienced change in No Accompanied By: caregiver activities of daily living that may affect Transfer Assistance: Harrel Lemon Lift risk of falls: Patient Identification Verified: Yes Hospitalized since last visit: No Secondary Verification Process Completed: Yes Pain Present Now: No Patient Requires Transmission-Based Precautions: No Patient Has Alerts: No Electronic Signature(s) Signed: 03/14/2022 5:13:25 PM By: Rosalio Loud MSN RN CNS WTA Entered By: Rosalio Loud on 03/14/2022 10:51:31 -------------------------------------------------------------------------------- Clinic Level of Care Assessment Details Patient Name: Date of Service: Kevin Custard A. 03/14/2022 10:00 A M Medical Record Number: [id number] Patient Account Number: [id number] Date of Birth/Sex: Treating RN: 03/10/1941 (81 y.o. Seward Meth Primary Care Areeba Sulser: Steele Sizer Other Clinician: Referring Dasean Brow: Treating Tollie Canada/Extender: Osborne Oman Weeks in Treatment: 19 Clinic Level of Care Assessment Items TOOL 4 Quantity Score X- 1 0 Use when only an EandM is performed on FOLLOW-UP visit ASSESSMENTS - Nursing Assessment / Reassessment X- 1 10 Reassessment of Co-morbidities (includes updates in patient  status) X- 1 5 Reassessment of Adherence to Treatment Plan ASSESSMENTS - Wound and Skin A ssessment / Reassessment '[]'$  - 0 Simple Wound Assessment / Reassessment - one wound X- 4 5 Complex Wound Assessment / Reassessment - multiple wounds [name], [name] ([id number]) [id number] Page 2 of 13 '[]'$  - 0 Dermatologic / Skin Assessment (not related to wound area) ASSESSMENTS - Focused Assessment '[]'$  - 0 Circumferential Edema Measurements - multi extremities '[]'$  - 0 Nutritional Assessment / Counseling / Intervention '[]'$  - 0 Lower Extremity Assessment (monofilament, tuning fork, pulses) '[]'$  - 0 Peripheral Arterial Disease Assessment (using hand held doppler) ASSESSMENTS - Ostomy and/or Continence Assessment and Care '[]'$  - 0 Incontinence Assessment and Management '[]'$  - 0 Ostomy Care Assessment and Management (repouching, etc.) PROCESS - Coordination of Care '[]'$  - 0 Simple Patient / Family Education for ongoing care '[]'$  - 0 Complex (extensive) Patient / Family Education for ongoing care X- 1 10 Staff obtains Programmer, systems, Records, T Results / Process Orders est '[]'$  - 0 Staff telephones HHA, Nursing Homes / Clarify orders / etc '[]'$  - 0 Routine Transfer to another Facility (non-emergent condition) '[]'$  - 0 Routine Hospital Admission (non-emergent condition) '[]'$  - 0 New Admissions / Biomedical engineer / Ordering NPWT Apligraf, etc. , '[]'$  - 0 Emergency Hospital Admission (emergent condition) '[]'$  - 0 Simple Discharge Coordination X- 1 15 Complex (extensive) Discharge Coordination PROCESS - Special Needs '[]'$  - 0 Pediatric / Minor Patient Management '[]'$  - 0 Isolation Patient Management '[]'$  - 0 Hearing / Language / Visual special needs '[]'$  - 0 Assessment of Community assistance (transportation, D/C planning, etc.) '[]'$  - 0 Additional assistance / Altered mentation '[]'$  - 0 Support Surface(s) Assessment (bed, cushion, seat, etc.) INTERVENTIONS - Wound Cleansing /  Measurement '[]'$  - 0 Simple Wound Cleansing - one wound X- 4 5 Complex Wound Cleansing - multiple wounds X- 1 5 Wound Imaging (photographs - any number of wounds) '[]'$  - 0 Wound Tracing (  instead of photographs) '[]'$  - 0 Simple Wound Measurement - one wound X- 4 5 Complex Wound Measurement - multiple wounds INTERVENTIONS - Wound Dressings '[]'$  - 0 Small Wound Dressing one or multiple wounds X- 4 15 Medium Wound Dressing one or multiple wounds '[]'$  - 0 Large Wound Dressing one or multiple wounds '[]'$  - 0 Application of Medications - topical '[]'$  - 0 Application of Medications - injection INTERVENTIONS - Miscellaneous '[]'$  - 0 External ear exam '[]'$  - 0 Specimen Collection (cultures, biopsies, blood, body fluids, etc.) '[]'$  - 0 Specimen(s) / Culture(s) sent or taken to Lab for analysis [name], [name] ([id number]) [id number] Page 3 of 13 '[]'$  - 0 Patient Transfer (multiple staff / Civil Service fast streamer / Similar devices) '[]'$  - 0 Simple Staple / Suture removal (25 or less) '[]'$  - 0 Complex Staple / Suture removal (26 or more) '[]'$  - 0 Hypo / Hyperglycemic Management (close monitor of Blood Glucose) '[]'$  - 0 Ankle / Brachial Index (ABI) - do not check if billed separately X- 1 5 Vital Signs Has the patient been seen at the hospital within the last three years: Yes Total Score: 170 Level Of Care: New/Established - Level 5 Electronic Signature(s) Signed: 03/14/2022 5:13:25 PM By: Rosalio Loud MSN RN CNS WTA Entered By: Rosalio Loud on 03/14/2022 13:02:34 -------------------------------------------------------------------------------- Encounter Discharge Information Details Patient Name: Date of Service: Kevin Gauze M A. 03/14/2022 10:00 A M Medical Record Number: [id number] Patient Account Number: [id number] Date of Birth/Sex: Treating RN: Mar 16, 1941 (81 y.o. Seward Meth Primary Care Temitayo Covalt: Steele Sizer Other Clinician: Referring Bosten Newstrom: Treating  Dessire Grimes/Extender: Osborne Oman Weeks in Treatment: 19 Encounter Discharge Information Items Discharge Condition: Stable Ambulatory Status: Wheelchair Discharge Destination: Home Transportation: Private Auto Accompanied By: caregiver Schedule Follow-up Appointment: No Clinical Summary of Care: Electronic Signature(s) Signed: 03/14/2022 5:13:25 PM By: Rosalio Loud MSN RN CNS WTA Entered By: Rosalio Loud on 03/14/2022 13:06:39 -------------------------------------------------------------------------------- Lower Extremity Assessment Details Patient Name: Date of Service: Kevin Custard A. 03/14/2022 10:00 A M Medical Record Number: [id number] Patient Account Number: [id number] Date of Birth/Sex: Treating RN: Jul 22, 1940 (81 y.o. Seward Meth Primary Care Renley Banwart: Steele Sizer Other Clinician: Referring Tasnim Balentine: Treating Bri Wakeman/Extender: Roxanne Mins ([id number]) [id number] Page 4 of 13 Weeks in Treatment: 19 Electronic Signature(s) Signed: 03/14/2022 5:13:25 PM By: Rosalio Loud MSN RN CNS WTA Entered By: Rosalio Loud on 03/14/2022 11:17:01 -------------------------------------------------------------------------------- Multi Wound Chart Details Patient Name: Date of Service: Kevin Custard A. 03/14/2022 10:00 A M Medical Record Number: [id number] Patient Account Number: [id number] Date of Birth/Sex: Treating RN: 09/29/1940 (81 y.o. Seward Meth Primary Care Vinessa Macconnell: Steele Sizer Other Clinician: Referring Josceline Chenard: Treating Ikeya Brockel/Extender: Osborne Oman Weeks in Treatment: 19 Vital Signs Height(in): 63 Pulse(bpm): 59 Weight(lbs): 135 Blood Pressure(mmHg): 92/53 Body Mass Index(BMI): 23.9 Temperature(F): 97.6 Respiratory Rate(breaths/min): 18 [2:Photos:] Right Gluteus Left Calcaneus Left Gluteus Wound Location: Pressure Injury Pressure Injury Pressure  Injury Wounding Event: Pressure Ulcer Pressure Ulcer Pressure Ulcer Primary Etiology: 11/07/2021 02/01/2022 02/26/2022 Date Acquired: 13 3 0 Weeks of Treatment: Open Open Open Wound Status: No No No Wound Recurrence: 0.8x0.5x0.1 0.5x1x0.2 1.3x1.1x0.1 Measurements L x W x D (cm) 0.314 0.393 1.123 A (cm) : rea 0.031 0.079 0.112 Volume (cm) : 66.70% 0.00% N/A % Reduction in A rea: 67.00% 0.00% N/A % Reduction in Volume: Category/Stage III Category/Stage III Category/Stage II Classification: None Present Medium Medium Exudate A mount: N/A Serosanguineous Serosanguineous Exudate Type: N/A red, brown red,  brown Exudate Color: Small (1-33%) Medium (34-66%) Small (1-33%) Granulation A mount: Red, Pink Red, Pink Red, Pink Granulation Quality: None Present (0%) Medium (34-66%) Small (1-33%) Necrotic A mount: N/A Adherent Slough Eschar Necrotic Tissue: Fat Layer (Subcutaneous Tissue): Yes Fat Layer (Subcutaneous Tissue): Yes Fat Layer (Subcutaneous Tissue): Yes Exposed Structures: Fascia: No Fascia: No Fascia: No Tendon: No Tendon: No Tendon: No Muscle: No Muscle: No Muscle: No Joint: No Joint: No Joint: No Bone: No Bone: No Bone: No Small (1-33%) N/A Small (1-33%) Epithelialization: Wound Number: 5 N/A N/A Photos: No Photos N/A N/A [name], [name] ([id number]) [id number] Page 5 of 13 Right Calcaneus N/A N/A Wound Location: Pressure Injury N/A N/A Wounding Event: Pressure Ulcer N/A N/A Primary Etiology: 02/26/2022 N/A N/A Date Acquired: 0 N/A N/A Weeks of Treatment: Open N/A N/A Wound Status: No N/A N/A Wound Recurrence: 0.3x0.3x0.2 N/A N/A Measurements L x W x D (cm) 0.071 N/A N/A A (cm) : rea 0.014 N/A N/A Volume (cm) : N/A N/A N/A % Reduction in A rea: N/A N/A N/A % Reduction in Volume: Category/Stage III N/A N/A Classification: Medium N/A N/A Exudate A mount: Serosanguineous N/A N/A Exudate Type: red,  brown N/A N/A Exudate Color: Large (67-100%) N/A N/A Granulation A mount: Pink N/A N/A Granulation Quality: Small (1-33%) N/A N/A Necrotic A mount: Adherent Slough N/A N/A Necrotic Tissue: Fat Layer (Subcutaneous Tissue): Yes N/A N/A Exposed Structures: Fascia: No Tendon: No Muscle: No Joint: No Bone: No None N/A N/A Epithelialization: Treatment Notes Electronic Signature(s) Signed: 03/14/2022 5:13:25 PM By: Rosalio Loud MSN RN CNS WTA Entered By: Rosalio Loud on 03/14/2022 11:45:46 -------------------------------------------------------------------------------- Multi-Disciplinary Care Plan Details Patient Name: Date of Service: Kevin Gauze M A. 03/14/2022 10:00 A M Medical Record Number: [id number] Patient Account Number: [id number] Date of Birth/Sex: Treating RN: 1940/11/14 (81 y.o. Seward Meth Primary Care Reida Hem: Steele Sizer Other Clinician: Referring Jora Galluzzo: Treating Dam Ashraf/Extender: Osborne Oman Weeks in Treatment: 19 Active Inactive Wound/Skin Impairment Nursing Diagnoses: Knowledge deficit related to ulceration/compromised skin integrity Goals: Patient/caregiver will verbalize understanding of skin care regimen Date Initiated: 10/27/2021 Target Resolution Date: 02/18/2022 Goal Status: Active Ulcer/skin breakdown will have a volume reduction of 30% by week 4 Date Initiated: 10/27/2021 Date Inactivated: 01/19/2022 Target Resolution Date: 11/27/2021 Goal Status: Unmet Unmet Reason: comordbities Ulcer/skin breakdown will have a volume reduction of 50% by week 8 Date Initiated: 10/27/2021 Date Inactivated: 01/19/2022 Target Resolution Date: 12/28/2021 Goal Status: Unmet Unmet Reason: comordbities Ulcer/skin breakdown will have a volume reduction of 80% by week 12 Date Initiated: 10/27/2021 Date Inactivated: 02/16/2022 Target Resolution Date: 01/27/2022 [name], [name] ([id number]) [id number] Page 6 of  13 Goal Status: Unmet Unmet Reason: comorbdities Ulcer/skin breakdown will heal within 14 weeks Date Initiated: 10/27/2021 Target Resolution Date: 02/27/2022 Goal Status: Active Interventions: Assess patient/caregiver ability to obtain necessary supplies Assess patient/caregiver ability to perform ulcer/skin care regimen upon admission and as needed Assess ulceration(s) every visit Notes: Electronic Signature(s) Signed: 03/14/2022 5:13:25 PM By: Rosalio Loud MSN RN CNS WTA Entered By: Rosalio Loud on 03/14/2022 11:17:09 -------------------------------------------------------------------------------- Pain Assessment Details Patient Name: Date of Service: Kevin Custard A. 03/14/2022 10:00 A M Medical Record Number: [id number] Patient Account Number: [id number] Date of Birth/Sex: Treating RN: 1940/06/27 (81 y.o. Seward Meth Primary Care Alishah Schulte: Steele Sizer Other Clinician: Referring Jimi Schappert: Treating Macrina Lehnert/Extender: Osborne Oman Weeks in Treatment: 19 Active Problems Location of Pain Severity and Description of Pain Patient Has Paino No Site Locations Pain Management and Medication  Current Pain Management: Electronic Signature(s) Signed: 03/14/2022 5:13:25 PM By: Rosalio Loud MSN RN CNS WTA Entered By: Rosalio Loud on 03/14/2022 10:52:06 [name] ([id number]) [id number] Page 7 of 13 -------------------------------------------------------------------------------- Patient/Caregiver Education Details Patient Name: Date of Service: [name] 11/21/2023andnbsp10:00 A M Medical Record Number: [id number] Patient Account Number: [id number] Date of Birth/Gender: Treating RN: 01-27-1941 (81 y.o. Seward Meth Primary Care Physician: Steele Sizer Other Clinician: Referring Physician: Treating Physician/Extender: Rogelio Seen in Treatment: 19 Education Assessment Education Provided  To: Patient Education Topics Provided Wound/Skin Impairment: Handouts: Caring for Your Ulcer Spanish Methods: Explain/Verbal Responses: State content correctly Electronic Signature(s) Signed: 03/14/2022 5:13:25 PM By: Rosalio Loud MSN RN CNS WTA Entered By: Rosalio Loud on 03/14/2022 13:02:55 -------------------------------------------------------------------------------- Wound Assessment Details Patient Name: Date of Service: Kevin Custard A. 03/14/2022 10:00 A M Medical Record Number: [id number] Patient Account Number: [id number] Date of Birth/Sex: Treating RN: 08-21-1940 (81 y.o. Seward Meth Primary Care Renji Berwick: Steele Sizer Other Clinician: Referring Jenna Routzahn: Treating Zyren Sevigny/Extender: Osborne Oman Weeks in Treatment: 19 Wound Status Wound Number: 2 Primary Etiology: Pressure Ulcer Wound Location: Right Gluteus Wound Status: Open Wounding Event: Pressure Injury Date Acquired: 11/07/2021 Weeks Of Treatment: 13 Clustered Wound: No Photos [name], [name] ([id number]) [id number] Page 8 of 13 Wound Measurements Length: (cm) 0.8 Width: (cm) 0.5 Depth: (cm) 0.1 Area: (cm) 0.314 Volume: (cm) 0.031 % Reduction in Area: 66.7% % Reduction in Volume: 67% Epithelialization: Small (1-33%) Wound Description Classification: Category/Stage III Exudate Amount: None Present Foul Odor After Cleansing: No Slough/Fibrino No Wound Bed Granulation Amount: Small (1-33%) Exposed Structure Granulation Quality: Red, Pink Fascia Exposed: No Necrotic Amount: None Present (0%) Fat Layer (Subcutaneous Tissue) Exposed: Yes Tendon Exposed: No Muscle Exposed: No Joint Exposed: No Bone Exposed: No Treatment Notes Wound #2 (Gluteus) Wound Laterality: Right Cleanser Peri-Wound Care Topical Primary Dressing Silvercel 4 1/4x 4 1/4 (in/in) Discharge Instruction: Apply Silvercel 4 1/4x 4 1/4 (in/in) as instructed (Border) Zetuvit  Plus Silicone Border Dressing 5x5 (in/in) Secondary Dressing Secured With Compression Wrap Compression Stockings Add-Ons Electronic Signature(s) Signed: 03/14/2022 5:13:25 PM By: Rosalio Loud MSN RN CNS WTA Entered By: Rosalio Loud on 03/14/2022 11:09:53 [name] ([id number]) [id number] Page 9 of 13 -------------------------------------------------------------------------------- Wound Assessment Details Patient Name: Date of Service: Kevin Custard A. 03/14/2022 10:00 A M Medical Record Number: [id number] Patient Account Number: [id number] Date of Birth/Sex: Treating RN: [birth year] (81 y.o. Seward Meth Primary Care Christerpher Clos: Steele Sizer Other Clinician: Referring Chanze Teagle: Treating Briella Hobday/Extender: Osborne Oman Weeks in Treatment: 19 Wound Status Wound Number: 3 Primary Etiology: Pressure Ulcer Wound Location: Left Calcaneus Wound Status: Open Wounding Event: Pressure Injury Date Acquired: 02/01/2022 Weeks Of Treatment: 3 Clustered Wound: No Photos Wound Measurements Length: (cm) 0.5 Width: (cm) 1 Depth: (cm) 0.2 Area: (cm) 0.393 Volume: (cm) 0.079 % Reduction in Area: 0% % Reduction in Volume: 0% Wound Description Classification: Category/Stage III Exudate Amount: Medium Exudate Type: Serosanguineous Exudate Color: red, brown Foul Odor After Cleansing: No Slough/Fibrino Yes Wound Bed Granulation Amount: Medium (34-66%) Exposed Structure Granulation Quality: Red, Pink Fascia Exposed: No Necrotic Amount: Medium (34-66%) Fat Layer (Subcutaneous Tissue) Exposed: Yes Necrotic Quality: Adherent Slough Tendon Exposed: No Muscle Exposed: No Joint Exposed: No Bone Exposed: No Treatment Notes Wound #3 (Calcaneus) Wound Laterality: Left Cleanser Peri-Wound Care Topical Primary Dressing Silvercel 4 1/4x 4 1/4 (in/in) Discharge Instruction: Apply Silvercel 4 1/4x 4 1/4 (in/in) as  instructed Secondary Dressing  ABD Pad 5x9 (in/in) Discharge Instruction: Cover with ABD pad Secured With Kerlix Roll Sterile or Non-Sterile 6-ply 4.5x4 (yd/yd) Discharge Instruction: Apply Kerlix as directed [name], [name] ([id number]) [id number] Page 10 of 13 Compression Wrap Compression Stockings Add-Ons Electronic Signature(s) Signed: 03/14/2022 5:13:25 PM By: Rosalio Loud MSN RN CNS WTA Entered By: Rosalio Loud on 03/14/2022 11:10:53 -------------------------------------------------------------------------------- Wound Assessment Details Patient Name: Date of Service: Kevin Custard A. 03/14/2022 10:00 A M Medical Record Number: [id number] Patient Account Number: [id number] Date of Birth/Sex: Treating RN: November 11, 1940 (81 y.o. Seward Meth Primary Care Lamarion Mcevers: Steele Sizer Other Clinician: Referring Aleiah Mohammed: Treating Daylen Hack/Extender: Osborne Oman Weeks in Treatment: 19 Wound Status Wound Number: 4 Primary Etiology: Pressure Ulcer Wound Location: Left Gluteus Wound Status: Open Wounding Event: Pressure Injury Date Acquired: 02/26/2022 Weeks Of Treatment: 0 Clustered Wound: No Photos Wound Measurements Length: (cm) 1.3 Width: (cm) 1.1 Depth: (cm) 0.1 Area: (cm) 1.123 Volume: (cm) 0.112 % Reduction in Area: % Reduction in Volume: Epithelialization: Small (1-33%) Tunneling: No Undermining: No Wound Description Classification: Category/Stage II Exudate Amount: Medium Exudate Type: Serosanguineous Exudate Color: red, brown Foul Odor After Cleansing: No Slough/Fibrino No Wound Bed Granulation Amount: Small (1-33%) Exposed Structure Granulation Quality: Red, Pink Fascia Exposed: No Necrotic Amount: Small (1-33%) Fat Layer (Subcutaneous Tissue) Exposed: Yes Necrotic Quality: Eschar Tendon Exposed: No [name], [name] ([id number]) [id number] Page 11 of 13 Muscle Exposed:  No Joint Exposed: No Bone Exposed: No Treatment Notes Wound #4 (Gluteus) Wound Laterality: Left Cleanser Peri-Wound Care Topical Primary Dressing Silvercel 4 1/4x 4 1/4 (in/in) Discharge Instruction: Apply Silvercel 4 1/4x 4 1/4 (in/in) as instructed (Border) Zetuvit Plus Silicone Border Dressing 5x5 (in/in) Secondary Dressing Secured With Compression Wrap Compression Stockings Add-Ons Electronic Signature(s) Signed: 03/14/2022 5:13:25 PM By: Rosalio Loud MSN RN CNS WTA Entered By: Rosalio Loud on 03/14/2022 11:11:32 -------------------------------------------------------------------------------- Wound Assessment Details Patient Name: Date of Service: Kevin Custard A. 03/14/2022 10:00 A M Medical Record Number: [id number] Patient Account Number: [id number] Date of Birth/Sex: Treating RN: [birth year] (81 y.o. Seward Meth Primary Care Madox Corkins: Steele Sizer Other Clinician: Referring Brion Hedges: Treating Lonya Johannesen/Extender: Osborne Oman Weeks in Treatment: 19 Wound Status Wound Number: 5 Primary Etiology: Pressure Ulcer Wound Location: Right Calcaneus Wound Status: Open Wounding Event: Pressure Injury Date Acquired: 02/26/2022 Weeks Of Treatment: 0 Clustered Wound: No Wound Measurements Length: (cm) 0.3 Width: (cm) 0.3 Depth: (cm) 0.2 Area: (cm) 0.071 Volume: (cm) 0.014 % Reduction in Area: % Reduction in Volume: Epithelialization: None Tunneling: No Undermining: No Wound Description Classification: Category/Stage III Exudate Amount: Medium Exudate Type: Serosanguineous Exudate Color: red, brown [name], [name] ([id number]) Wound Bed Granulation Amount: Large (67-100%) Granulation Quality: Pink Necrotic Amount: Small (1-33%) Necrotic Quality: Adherent Slough Foul Odor After Cleansing: No Slough/Fibrino No [id number] Page 12 of 13 Exposed Structure Fascia Exposed: No Fat Layer (Subcutaneous Tissue)  Exposed: Yes Tendon Exposed: No Muscle Exposed: No Joint Exposed: No Bone Exposed: No Treatment Notes Wound #5 (Calcaneus) Wound Laterality: Right Cleanser Peri-Wound Care Topical Primary Dressing Silvercel 4 1/4x 4 1/4 (in/in) Discharge Instruction: Apply Silvercel 4 1/4x 4 1/4 (in/in) as instructed Secondary Dressing ABD Pad 5x9 (in/in) Discharge Instruction: Cover with ABD pad Secured With Kerlix Roll Sterile or Non-Sterile 6-ply 4.5x4 (yd/yd) Discharge Instruction: Apply Kerlix as directed Compression Wrap Compression Stockings Add-Ons Electronic Signature(s) Signed: 03/14/2022 5:13:25 PM By: Rosalio Loud MSN RN CNS WTA Entered By: Rosalio Loud on 03/14/2022 11:14:42 -------------------------------------------------------------------------------- Kevin Details Patient Name: Date of Service:  A Florinda Marker A. 03/14/2022 10:00 A M Medical Record Number: [id number] Patient Account Number: [id number] Date of Birth/Sex: Treating RN: Dec 09, 1940 (81 y.o. Seward Meth Primary Care Natanya Holecek: Steele Sizer Other Clinician: Referring Josiah Wojtaszek: Treating Kimbrely Buckel/Extender: Osborne Oman Weeks in Treatment: 19 Vital Signs Time Taken: 10:48 Temperature (F): 97.6 Height (in): 63 Pulse (bpm): 59 Weight (lbs): 135 Respiratory Rate (breaths/min): 18 Body Mass Index (BMI): 23.9 Blood Pressure (mmHg): 92/53 Reference Range: 80 - 120 mg / dl Electronic Signature(s) Signed: 03/14/2022 5:13:25 PM By: Rosalio Loud MSN RN CNS WTA Entered By: Rosalio Loud on 03/14/2022 10:52:00 [name] ([id number]) [id number] Page 13 of 13

## 2022-03-14 NOTE — Progress Notes (Addendum)
ARLEY, SALAMONE ([id number]) [id number] Page 1 of 7 Visit Report for 03/14/2022 Chief Complaint Document Details Patient Name: Date of Service: Kevin Custard A. 03/14/2022 10:00 A M Medical Record Number: [id number] Patient Account Number: [id number] Date of Birth/Sex: Treating RN: 27-Jan-1941 (81 y.o. [name] Primary Care Provider: Steele Sizer Other Clinician: Referring Provider: Treating Provider/Extender: Osborne Oman Weeks in Treatment: 19 Information Obtained from: Patient Chief Complaint Left heel pressure ulcer and right buttock pressure ulcer Electronic Signature(s) Signed: 03/14/2022 11:08:44 AM By: Worthy Keeler PA-C Entered By: Worthy Keeler on 03/14/2022 11:08:44 -------------------------------------------------------------------------------- HPI Details Patient Name: Date of Service: Kevin Custard A. 03/14/2022 10:00 A M Medical Record Number: [id number] Patient Account Number: [id number] Date of Birth/Sex: Treating RN: 1940-10-16 (81 y.o. [name] Primary Care Provider: Steele Sizer Other Clinician: Referring Provider: Treating Provider/Extender: Osborne Oman Weeks in Treatment: 19 History of Present Illness HPI Description: 10-27-2021 upon evaluation today patient presents for initial evaluation here in the clinic concerning an issue that has been present since around June 1. Subsequently the patient's caregiver from [name] tells me that he has been treated mainly by herself she has been doing what she can to try to take care of this using DuoDERM over the area. There is a lot of skin that is actually loosen up over the top of the heel surface but not all of this is necessarily covering wound in fact there is a majority of this that is not wound related as far as the skin over top is concerned. Fortunately I do not see any signs of active infection locally or systemically  at this time which is great news. He does have some slough over the surface of the wound but mainly the skin around the edges the thing I am most concerned with. It is trapping a lot of fluid to be honest. Patient does have a history of chronic venous insufficiency, cerebral palsy, and stated peripheral vascular disease. With that being said I do not see any evidence right now of active infection which is great news. No fevers, chills, nausea, vomiting, or diarrhea. 11-10-2021 upon evaluation today patient appears to be doing well currently in regard to his wound on the heel. Fortunately there does not appear to be any signs of active infection locally or systemically at this time which is great news. Fortunately I do not see any evidence of infection which is also excellent. No fevers, chills, nausea, vomiting, or diarrhea. 12-08-2021 upon evaluation today patient appears to be doing well currently in regard to his heel although unfortunately has a right gluteal pressure ulcer. Fortunately there does not appear to be any signs of active infection locally or systemically which is great news and overall I am extremely pleased with where we stand today. TELFORD, ARCHAMBEAU ([id number]) [id number] Page 2 of 7 12-29-2021 upon evaluation patient's heel as well as gluteal area both appear to be doing better. There does not appear to be any signs of active infection locally or systemically at this time which is great news. No fevers, chills, nausea, vomiting, or diarrhea. 01-19-2022 upon evaluation today patient appears to be doing well with regard to his wound. He has been tolerating the dressing changes without complication. Fortunately there does not appear to be any evidence of active infection locally or systemically at this time which is great news. No fevers, chills, nausea, vomiting, or diarrhea. 02-16-2022 upon evaluation today patient appears to be  doing well currently in  regard to his wounds in general everything is healed although he has a new wound on the left heel which is something that was not present previous. Again this is a little disconcerting especially considering the other wounds have completely closed and we were hoping he be ready for discharge today. Again I do believe that this is something that looks like a pressure injury stage III. 03-14-2022 upon evaluation today patient appears to be doing poorly in regard to both his heels as well as his gluteal region. Unfortunately this has reopened now as a result of excess pressure and he does not seem to be offloaded as well as it should be. Unfortunately I do think that this is due to lack of turning and repositioning. There is nothing else that would account for the bruising that we are seeing at this point in regards to the deep tissue injury. I discussed that with the patient's caregiver with him today. I do think, to put in a call to the facility as well at [name] where he is currently residing and Gena Fray is the nurse there. Her phone number is [phone]. Electronic Signature(s) Signed: 03/14/2022 3:45:13 PM By: Worthy Keeler PA-C Previous Signature: 03/14/2022 3:42:11 PM Version By: Worthy Keeler PA-C Entered By: Worthy Keeler on 03/14/2022 15:45:12 -------------------------------------------------------------------------------- Physical Exam Details Patient Name: Date of Service: Kevin Custard A. 03/14/2022 10:00 A M Medical Record Number: [id number] Patient Account Number: [id number] Date of Birth/Sex: Treating RN: [birth year] (81 y.o. [name] Primary Care Provider: Steele Sizer Other Clinician: Referring Provider: Treating Provider/Extender: Osborne Oman Weeks in Treatment: 29 Constitutional Well-nourished and well-hydrated in no acute distress. Respiratory normal breathing without difficulty. Psychiatric this patient is able to make  decisions and demonstrates good insight into disease process. Alert and Oriented x 3. pleasant and cooperative. Notes Upon evaluation patient's wounds all appear to be doing worse unfortunately. He does have deep tissue injuries at multiple locations on both heels as well as the gluteal region which is unfortunate. These were almost all completely closed but obviously I think things are going downhill at this point. That needs to be addressed at the facility in the way of that he needs to be repositioned every 2 hours minimum and he needs to have the proper seating if he is to be up in his wheelchair. Electronic Signature(s) Signed: 03/14/2022 3:45:32 PM By: Worthy Keeler PA-C Entered By: Worthy Keeler on 03/14/2022 15:45:32 [name] ([id number]) [id number] Page 3 of 7 -------------------------------------------------------------------------------- Physician Orders Details Patient Name: Date of Service: Kevin Custard A. 03/14/2022 10:00 A M Medical Record Number: [id number] Patient Account Number: [id number] Date of Birth/Sex: Treating RN: 22-Sep-1940 (81 y.o. [name] Primary Care Provider: Steele Sizer Other Clinician: Referring Provider: Treating Provider/Extender: Osborne Oman Weeks in Treatment: 80 Verbal / Phone Orders: No Diagnosis Coding ICD-10 Coding Code Description [phone] Pressure ulcer of left heel, stage 3 L89.310 Pressure ulcer of right buttock, unstageable G80.8 Other cerebral palsy I73.89 Other specified peripheral vascular diseases I87.393 Chronic venous hypertension (idiopathic) with other complications of bilateral lower extremity Follow-up Appointments Return Appointment in 1 week. Wound Treatment Wound #2 - Gluteus Wound Laterality: Right Prim Dressing: Silvercel 4 1/4x 4 1/4 (in/in) 1 x Per Day/30 Days ary Discharge Instructions: Apply Silvercel 4 1/4x 4 1/4 (in/in) as instructed Prim  Dressing: (Border) Zetuvit Plus Silicone Border Dressing 5x5 (in/in) ary 1 x Per  Day/30 Days Wound #3 - Calcaneus Wound Laterality: Left Prim Dressing: Silvercel 4 1/4x 4 1/4 (in/in) 1 x Per Day/30 Days ary Discharge Instructions: Apply Silvercel 4 1/4x 4 1/4 (in/in) as instructed Secondary Dressing: ABD Pad 5x9 (in/in) 1 x Per Day/30 Days Discharge Instructions: Cover with ABD pad Secured With: Kerlix Roll Sterile or Non-Sterile 6-ply 4.5x4 (yd/yd) 1 x Per Day/30 Days Discharge Instructions: Apply Kerlix as directed Wound #4 - Gluteus Wound Laterality: Left Prim Dressing: Silvercel 4 1/4x 4 1/4 (in/in) 1 x Per Day/30 Days ary Discharge Instructions: Apply Silvercel 4 1/4x 4 1/4 (in/in) as instructed Prim Dressing: (Border) Zetuvit Plus Silicone Border Dressing 5x5 (in/in) ary 1 x Per Day/30 Days Wound #5 - Calcaneus Wound Laterality: Right Prim Dressing: Silvercel 4 1/4x 4 1/4 (in/in) 1 x Per Day/30 Days ary Discharge Instructions: Apply Silvercel 4 1/4x 4 1/4 (in/in) as instructed Secondary Dressing: ABD Pad 5x9 (in/in) 1 x Per Day/30 Days Discharge Instructions: Cover with ABD pad Secured With: Kerlix Roll Sterile or Non-Sterile 6-ply 4.5x4 (yd/yd) 1 x Per Day/30 Days Discharge Instructions: Apply Kerlix as directed Electronic Signature(s) Signed: 03/14/2022 5:13:25 PM By: Rosalio Loud MSN RN CNS WTA Signed: 03/14/2022 5:41:33 PM By: Adrian Saran ([id number]) By: Worthy Keeler PA-C [id number] Page 4 of 7 Signed: 03/14/2022 5:41:33 PM Entered By: Rosalio Loud on 03/14/2022 13:05:41 -------------------------------------------------------------------------------- Problem List Details Patient Name: Date of Service: Kevin Custard A. 03/14/2022 10:00 A M Medical Record Number: [id number] Patient Account Number: [id number] Date of Birth/Sex: Treating RN: 08-Jan-1941 (81 y.o. [name] Primary Care Provider: Steele Sizer Other Clinician: Referring Provider: Treating Provider/Extender: Osborne Oman Weeks in Treatment: 19 Active Problems ICD-10 Encounter Code Description Active Date MDM Diagnosis [id number] Pressure ulcer of left heel, stage 3 10/27/2021 No Yes L89.310 Pressure ulcer of right buttock, unstageable 12/08/2021 No Yes G80.8 Other cerebral palsy 10/27/2021 No Yes I73.89 Other specified peripheral vascular diseases 10/27/2021 No Yes I87.393 Chronic venous hypertension (idiopathic) with other complications of bilateral 10/27/2021 No Yes lower extremity Inactive Problems Resolved Problems Electronic Signature(s) Signed: 03/14/2022 11:08:41 AM By: Worthy Keeler PA-C Entered By: Worthy Keeler on 03/14/2022 11:08:41 [name] ([id number]) [id number] Page 5 of 7 -------------------------------------------------------------------------------- Progress Note Details Patient Name: Date of Service: Kevin Custard A. 03/14/2022 10:00 A M Medical Record Number: [id number] Patient Account Number: [id number] Date of Birth/Sex: Treating RN: 03-Jan-1941 (81 y.o. [name] Primary Care Provider: Steele Sizer Other Clinician: Referring Provider: Treating Provider/Extender: Osborne Oman Weeks in Treatment: 19 Subjective Chief Complaint Information obtained from Patient Left heel pressure ulcer and right buttock pressure ulcer History of Present Illness (HPI) 10-27-2021 upon evaluation today patient presents for initial evaluation here in the clinic concerning an issue that has been present since around June 1. Subsequently the patient's caregiver from [name] tells me that he has been treated mainly by herself she has been doing what she can to try to take care of this using DuoDERM over the area. There is a lot of skin that is actually loosen up over the top of the heel surface but not all of this is necessarily covering  wound in fact there is a majority of this that is not wound related as far as the skin over top is concerned. Fortunately I do not see any signs of active infection locally or systemically at this time which is great news. He does have some slough  over the surface of the wound but mainly the skin around the edges the thing I am most concerned with. It is trapping a lot of fluid to be honest. Patient does have a history of chronic venous insufficiency, cerebral palsy, and stated peripheral vascular disease. With that being said I do not see any evidence right now of active infection which is great news. No fevers, chills, nausea, vomiting, or diarrhea. 11-10-2021 upon evaluation today patient appears to be doing well currently in regard to his wound on the heel. Fortunately there does not appear to be any signs of active infection locally or systemically at this time which is great news. Fortunately I do not see any evidence of infection which is also excellent. No fevers, chills, nausea, vomiting, or diarrhea. 12-08-2021 upon evaluation today patient appears to be doing well currently in regard to his heel although unfortunately has a right gluteal pressure ulcer. Fortunately there does not appear to be any signs of active infection locally or systemically which is great news and overall I am extremely pleased with where we stand today. 12-29-2021 upon evaluation patient's heel as well as gluteal area both appear to be doing better. There does not appear to be any signs of active infection locally or systemically at this time which is great news. No fevers, chills, nausea, vomiting, or diarrhea. 01-19-2022 upon evaluation today patient appears to be doing well with regard to his wound. He has been tolerating the dressing changes without complication. Fortunately there does not appear to be any evidence of active infection locally or systemically at this time which is great news. No fevers, chills,  nausea, vomiting, or diarrhea. 02-16-2022 upon evaluation today patient appears to be doing well currently in regard to his wounds in general everything is healed although he has a new wound on the left heel which is something that was not present previous. Again this is a little disconcerting especially considering the other wounds have completely closed and we were hoping he be ready for discharge today. Again I do believe that this is something that looks like a pressure injury stage III. 03-14-2022 upon evaluation today patient appears to be doing poorly in regard to both his heels as well as his gluteal region. Unfortunately this has reopened now as a result of excess pressure and he does not seem to be offloaded as well as it should be. Unfortunately I do think that this is due to lack of turning and repositioning. There is nothing else that would account for the bruising that we are seeing at this point in regards to the deep tissue injury. I discussed that with the patient's caregiver with him today. I do think, to put in a call to the facility as well at [name] where he is currently residing and Gena Fray is the nurse there. Her phone number is [phone]. Objective Constitutional Well-nourished and well-hydrated in no acute distress. Vitals Time Taken: 10:48 AM, Height: 63 in, Weight: 135 lbs, BMI: 23.9, Temperature: 97.6 F, Pulse: 59 bpm, Respiratory Rate: 18 breaths/min, Blood Pressure: 92/53 mmHg. Respiratory normal breathing without difficulty. Psychiatric this patient is able to make decisions and demonstrates good insight into disease process. Alert and Oriented x 3. pleasant and cooperative. General Notes: Upon evaluation patient's wounds all appear to be doing worse unfortunately. He does have deep tissue injuries at multiple locations on both heels as well as the gluteal region which is unfortunate. These were almost all completely closed but obviously I think  things are going downhill at this point. That needs to be addressed at the facility in the way of that he needs to be repositioned every 2 hours minimum and he needs to have the proper seating if he is to be up in his wheelchair. Integumentary (Hair, Skin) Wound #2 status is Open. Original cause of wound was Pressure Injury. The date acquired was: 11/07/2021. The wound has been in treatment 13 weeks. The wound is located on the Right Gluteus. The wound measures 0.8cm length x 0.5cm width x 0.1cm depth; 0.314cm^2 area and 0.031cm^3 volume. There is Fat Layer (Subcutaneous Tissue) exposed. There is a none present amount of drainage noted. There is small (1-33%) red, pink granulation within the wound bed. There is no necrotic tissue within the wound bed. [name], [name] ([id number]) [id number] Page 6 of 7 Wound #3 status is Open. Original cause of wound was Pressure Injury. The date acquired was: 02/01/2022. The wound has been in treatment 3 weeks. The wound is located on the Left Calcaneus. The wound measures 0.5cm length x 1cm width x 0.2cm depth; 0.393cm^2 area and 0.079cm^3 volume. There is Fat Layer (Subcutaneous Tissue) exposed. There is a medium amount of serosanguineous drainage noted. There is medium (34-66%) red, pink granulation within the wound bed. There is a medium (34-66%) amount of necrotic tissue within the wound bed including Adherent Slough. Wound #4 status is Open. Original cause of wound was Pressure Injury. The date acquired was: 02/26/2022. The wound is located on the Left Gluteus. The wound measures 1.3cm length x 1.1cm width x 0.1cm depth; 1.123cm^2 area and 0.112cm^3 volume. There is Fat Layer (Subcutaneous Tissue) exposed. There is no tunneling or undermining noted. There is a medium amount of serosanguineous drainage noted. There is small (1-33%) red, pink granulation within the wound bed. There is a small (1-33%) amount of necrotic tissue within  the wound bed including Eschar. Wound #5 status is Open. Original cause of wound was Pressure Injury. The date acquired was: 02/26/2022. The wound is located on the Right Calcaneus. The wound measures 0.3cm length x 0.3cm width x 0.2cm depth; 0.071cm^2 area and 0.014cm^3 volume. There is Fat Layer (Subcutaneous Tissue) exposed. There is no tunneling or undermining noted. There is a medium amount of serosanguineous drainage noted. There is large (67-100%) pink granulation within the wound bed. There is a small (1-33%) amount of necrotic tissue within the wound bed including Adherent Slough. Assessment Active Problems ICD-10 Pressure ulcer of left heel, stage 3 Pressure ulcer of right buttock, unstageable Other cerebral palsy Other specified peripheral vascular diseases Chronic venous hypertension (idiopathic) with other complications of bilateral lower extremity Plan Follow-up Appointments: Return Appointment in 1 week. WOUND #2: - Gluteus Wound Laterality: Right Prim Dressing: Silvercel 4 1/4x 4 1/4 (in/in) 1 x Per Day/30 Days ary Discharge Instructions: Apply Silvercel 4 1/4x 4 1/4 (in/in) as instructed Prim Dressing: (Border) Zetuvit Plus Silicone Border Dressing 5x5 (in/in) 1 x Per Day/30 Days ary WOUND #3: - Calcaneus Wound Laterality: Left Prim Dressing: Silvercel 4 1/4x 4 1/4 (in/in) 1 x Per Day/30 Days ary Discharge Instructions: Apply Silvercel 4 1/4x 4 1/4 (in/in) as instructed Secondary Dressing: ABD Pad 5x9 (in/in) 1 x Per Day/30 Days Discharge Instructions: Cover with ABD pad Secured With: Kerlix Roll Sterile or Non-Sterile 6-ply 4.5x4 (yd/yd) 1 x Per Day/30 Days Discharge Instructions: Apply Kerlix as directed WOUND #4: - Gluteus Wound Laterality: Left Prim Dressing: Silvercel 4 1/4x 4 1/4 (in/in) 1 x Per Day/30 Days ary Discharge  Instructions: Apply Silvercel 4 1/4x 4 1/4 (in/in) as instructed Prim Dressing: (Border) Zetuvit Plus Silicone Border Dressing 5x5 (in/in) 1 x  Per Day/30 Days ary WOUND #5: - Calcaneus Wound Laterality: Right Prim Dressing: Silvercel 4 1/4x 4 1/4 (in/in) 1 x Per Day/30 Days ary Discharge Instructions: Apply Silvercel 4 1/4x 4 1/4 (in/in) as instructed Secondary Dressing: ABD Pad 5x9 (in/in) 1 x Per Day/30 Days Discharge Instructions: Cover with ABD pad Secured With: Kerlix Roll Sterile or Non-Sterile 6-ply 4.5x4 (yd/yd) 1 x Per Day/30 Days Discharge Instructions: Apply Kerlix as directed 1. I did actually have a good conversation with Butch Penny still who is the RN over the assisted living facility. She is actually going to get in touch with staff for an emergency training session to make sure that they are doing the right things needed to keep Mr. Injure his wounds from breaking down further. I was very appreciative of her help. 2. I am also can recommend that we have the patient continue to monitor for any signs of worsening or infection. Obviously if anything changes they should let me know right now I think that the silver alginate dressing probably is still the best way to go. We will see patient back for reevaluation in 1 week here in the clinic. If anything worsens or changes patient will contact our office for additional recommendations. Electronic Signature(s) Signed: 03/14/2022 3:49:44 PM By: Worthy Keeler PA-C Entered By: Worthy Keeler on 03/14/2022 15:49:44 [name] ([id number]) [id number] Page 7 of 7 -------------------------------------------------------------------------------- SuperBill Details Patient Name: Date of Service: [name] 03/14/2022 Medical Record Number: [id number] Patient Account Number: [id number] Date of Birth/Sex: Treating RN: 05-May-1940 (81 y.o. [name] Primary Care Provider: Steele Sizer Other Clinician: Referring Provider: Treating Provider/Extender: Osborne Oman Weeks in Treatment: 19 Diagnosis Coding ICD-10  Codes Code Description [phone] Pressure ulcer of left heel, stage 3 L89.310 Pressure ulcer of right buttock, unstageable G80.8 Other cerebral palsy I73.89 Other specified peripheral vascular diseases I87.393 Chronic venous hypertension (idiopathic) with other complications of bilateral lower extremity Facility Procedures : CPT4 Code: [id number] Description: 52778 - WOUND CARE VISIT-LEV 5 EST PT Modifier: Quantity: 1 Physician Procedures : CPT4 Code Description Modifier [id number] - WC PHYS LEVEL 3 - EST PT ICD-10 Diagnosis Description L89.623 Pressure ulcer of left heel, stage 3 L89.310 Pressure ulcer of right buttock, unstageable G80.8 Other cerebral palsy I73.89 Other specified  peripheral vascular diseases Quantity: 1 Electronic Signature(s) Signed: 03/14/2022 3:50:01 PM By: Worthy Keeler PA-C Entered By: Worthy Keeler on 03/14/2022 15:49:59

## 2022-03-17 ENCOUNTER — Other Ambulatory Visit: Payer: Self-pay | Admitting: Family Medicine

## 2022-03-17 DIAGNOSIS — E441 Mild protein-calorie malnutrition: Secondary | ICD-10-CM

## 2022-03-21 ENCOUNTER — Encounter: Payer: Medicare Other | Admitting: Physician Assistant

## 2022-03-21 DIAGNOSIS — L89310 Pressure ulcer of right buttock, unstageable: Secondary | ICD-10-CM | POA: Diagnosis not present

## 2022-03-21 NOTE — Progress Notes (Signed)
SHAHZAD, THOMANN ([id number]) [id number] Page 1 of 7 Visit Report for 03/21/2022 Chief Complaint Document Details Patient Name: Date of Service: Kevin Custard A. 03/21/2022 9:30 A M Medical Record Number: [id number] Patient Account Number: [id number] Date of Birth/Sex: Treating RN: 01-Jul-1940 (81 y.o. [name]) Carlene Coria Primary Care Provider: Steele Sizer Other Clinician: Referring Provider: Treating Provider/Extender: Osborne Oman Weeks in Treatment: 20 Information Obtained from: Patient Chief Complaint Left heel pressure ulcer and right buttock pressure ulcer Electronic Signature(s) Signed: 03/21/2022 10:06:28 AM By: Worthy Keeler PA-C Entered By: Worthy Keeler on 03/21/2022 10:06:28 -------------------------------------------------------------------------------- HPI Details Patient Name: Date of Service: Kevin Custard A. 03/21/2022 9:30 A M Medical Record Number: [id number] Patient Account Number: [id number] Date of Birth/Sex: Treating RN: 1940-05-23 (81 y.o. [name] Primary Care Provider: Steele Sizer Other Clinician: Referring Provider: Treating Provider/Extender: Osborne Oman Weeks in Treatment: 20 History of Present Illness HPI Description: 10-27-2021 upon evaluation today patient presents for initial evaluation here in the clinic concerning an issue that has been present since around June 1. Subsequently the patient's caregiver from [name] tells me that he has been treated mainly by herself she has been doing what she can to try to take care of this using DuoDERM over the area. There is a lot of skin that is actually loosen up over the top of the heel surface but not all of this is necessarily covering wound in fact there is a majority of this that is not wound related as far as the skin over top is concerned. Fortunately I do not see any signs of active infection locally or systemically at  this time which is great news. He does have some slough over the surface of the wound but mainly the skin around the edges the thing I am most concerned with. It is trapping a lot of fluid to be honest. Patient does have a history of chronic venous insufficiency, cerebral palsy, and stated peripheral vascular disease. With that being said I do not see any evidence right now of active infection which is great news. No fevers, chills, nausea, vomiting, or diarrhea. 11-10-2021 upon evaluation today patient appears to be doing well currently in regard to his wound on the heel. Fortunately there does not appear to be any signs of active infection locally or systemically at this time which is great news. Fortunately I do not see any evidence of infection which is also excellent. No fevers, chills, nausea, vomiting, or diarrhea. 12-08-2021 upon evaluation today patient appears to be doing well currently in regard to his heel although unfortunately has a right gluteal pressure ulcer. Fortunately there does not appear to be any signs of active infection locally or systemically which is great news and overall I am extremely pleased with where we stand today. ADRION, MENZ ([id number]) [id number] Page 2 of 7 12-29-2021 upon evaluation patient's heel as well as gluteal area both appear to be doing better. There does not appear to be any signs of active infection locally or systemically at this time which is great news. No fevers, chills, nausea, vomiting, or diarrhea. 01-19-2022 upon evaluation today patient appears to be doing well with regard to his wound. He has been tolerating the dressing changes without complication. Fortunately there does not appear to be any evidence of active infection locally or systemically at this time which is great news. No fevers, chills, nausea, vomiting, or diarrhea. 02-16-2022 upon evaluation today patient appears to be  doing well currently in regard  to his wounds in general everything is healed although he has a new wound on the left heel which is something that was not present previous. Again this is a little disconcerting especially considering the other wounds have completely closed and we were hoping he be ready for discharge today. Again I do believe that this is something that looks like a pressure injury stage III. 03-14-2022 upon evaluation today patient appears to be doing poorly in regard to both his heels as well as his gluteal region. Unfortunately this has reopened now as a result of excess pressure and he does not seem to be offloaded as well as it should be. Unfortunately I do think that this is due to lack of turning and repositioning. There is nothing else that would account for the bruising that we are seeing at this point in regards to the deep tissue injury. I discussed that with the patient's caregiver with him today. I do think, to put in a call to the facility as well at [name] where he is currently residing and Gena Fray is the nurse there. Her phone number is [phone]. 03-21-2022 upon evaluation today patient's wounds are actually significantly better compared to last week's evaluation. Fortunately there does not appear to be any signs of infection locally or systemically which is great news and overall I am extremely pleased with where things stand currently. No fevers, chills, nausea, vomiting, or diarrhea. Electronic Signature(s) Signed: 03/21/2022 10:32:52 AM By: Worthy Keeler PA-C Entered By: Worthy Keeler on 03/21/2022 10:32:51 -------------------------------------------------------------------------------- Physical Exam Details Patient Name: Date of Service: Kevin Custard A. 03/21/2022 9:30 A M Medical Record Number: [id number] Patient Account Number: [id number] Date of Birth/Sex: Treating RN: 22-Aug-1940 (81 y.o. [name] Primary Care Provider: Steele Sizer Other  Clinician: Referring Provider: Treating Provider/Extender: Osborne Oman Weeks in Treatment: 44 Constitutional Well-nourished and well-hydrated in no acute distress. Respiratory normal breathing without difficulty. Psychiatric this patient is able to make decisions and demonstrates good insight into disease process. Alert and Oriented x 3. pleasant and cooperative. Notes Upon inspection patient's wound bed actually showed signs of excellent improvement in just 1 week's time. He is obviously been staying off of this and that is been making a big deal as far as an improvement is concerned in the overall appearance of his wounds. I am extremely happy with where things stand today. Electronic Signature(s) Signed: 03/21/2022 10:33:22 AM By: Worthy Keeler PA-C Entered By: Worthy Keeler on 03/21/2022 10:33:21 [name] ([id number]) [id number] Page 3 of 7 -------------------------------------------------------------------------------- Physician Orders Details Patient Name: Date of Service: Kevin Custard A. 03/21/2022 9:30 A M Medical Record Number: [id number] Patient Account Number: [id number] Date of Birth/Sex: Treating RN: [birth year] (81 y.o. [name]) Carlene Coria Primary Care Provider: Steele Sizer Other Clinician: Referring Provider: Treating Provider/Extender: Osborne Oman Weeks in Treatment: 20 Verbal / Phone Orders: No Diagnosis Coding Follow-up Appointments Return Appointment in 1 week. Wound Treatment Wound #2 - Gluteus Wound Laterality: Right Prim Dressing: Silvercel 4 1/4x 4 1/4 (in/in) 1 x Per Day/30 Days ary Discharge Instructions: Apply Silvercel 4 1/4x 4 1/4 (in/in) as instructed Prim Dressing: (Border) Zetuvit Plus Silicone Border Dressing 5x5 (in/in) ary 1 x Per Day/30 Days Wound #3 - Calcaneus Wound Laterality: Left Prim Dressing: Silvercel 4 1/4x 4 1/4 (in/in) 1 x Per Day/30 Days ary Discharge  Instructions: Apply Silvercel 4 1/4x 4 1/4 (in/in) as instructed  Secondary Dressing: ABD Pad 5x9 (in/in) 1 x Per Day/30 Days Discharge Instructions: Cover with ABD pad Secured With: Kerlix Roll Sterile or Non-Sterile 6-ply 4.5x4 (yd/yd) 1 x Per Day/30 Days Discharge Instructions: Apply Kerlix as directed Wound #4 - Gluteus Wound Laterality: Left Prim Dressing: Silvercel 4 1/4x 4 1/4 (in/in) 1 x Per Day/30 Days ary Discharge Instructions: Apply Silvercel 4 1/4x 4 1/4 (in/in) as instructed Prim Dressing: (Border) Zetuvit Plus Silicone Border Dressing 5x5 (in/in) ary 1 x Per Day/30 Days Electronic Signature(s) Signed: 03/21/2022 10:01:54 AM By: Carlene Coria RN Signed: 03/21/2022 4:53:03 PM By: Worthy Keeler PA-C Entered By: Carlene Coria on 03/21/2022 10:01:54 -------------------------------------------------------------------------------- Problem List Details Patient Name: Date of Service: [name] 03/21/2022 9:30 A M Medical Record Number: [id number] Patient Account Number: [id number] BARD, HAUPERT ([id number]) [id number] Page 4 of 7 Date of Birth/Sex: Treating RN: 1940/10/11 (81 y.o. [name]) Carlene Coria Primary Care Provider: Steele Sizer Other Clinician: Referring Provider: Treating Provider/Extender: Osborne Oman Weeks in Treatment: 20 Active Problems ICD-10 Encounter Code Description Active Date MDM Diagnosis [id number] Pressure ulcer of left heel, stage 3 10/27/2021 No Yes L89.310 Pressure ulcer of right buttock, unstageable 12/08/2021 No Yes G80.8 Other cerebral palsy 10/27/2021 No Yes I73.89 Other specified peripheral vascular diseases 10/27/2021 No Yes I87.393 Chronic venous hypertension (idiopathic) with other complications of bilateral 10/27/2021 No Yes lower extremity Inactive Problems Resolved Problems Electronic Signature(s) Signed: 03/21/2022 10:06:25 AM By: Worthy Keeler PA-C Entered By: Worthy Keeler on  03/21/2022 10:06:24 -------------------------------------------------------------------------------- Progress Note Details Patient Name: Date of Service: Kevin Custard A. 03/21/2022 9:30 A M Medical Record Number: [id number] Patient Account Number: [id number] Date of Birth/Sex: Treating RN: [birth year] (81 y.o. [name]) Carlene Coria Primary Care Provider: Steele Sizer Other Clinician: Referring Provider: Treating Provider/Extender: Osborne Oman Weeks in Treatment: 20 Subjective Chief Complaint Information obtained from Patient Left heel pressure ulcer and right buttock pressure ulcer History of Present Illness (HPI) 10-27-2021 upon evaluation today patient presents for initial evaluation here in the clinic concerning an issue that has been present since around June 1. Subsequently the patient's caregiver from [name] tells me that he has been treated mainly by herself she has been doing what she can to try to take care of this using DuoDERM over the area. There is a lot of skin that is actually loosen up over the top of the heel surface but not all of this is necessarily covering wound in fact there is a majority of this that is not wound related as far as the skin over top is concerned. Fortunately I do not see any signs of active infection locally or systemically at this time which is great news. He does have some slough over the surface of the wound but mainly the skin around [name], [name] ([id number]) [id number] Page 5 of 7 the edges the thing I am most concerned with. It is trapping a lot of fluid to be honest. Patient does have a history of chronic venous insufficiency, cerebral palsy, and stated peripheral vascular disease. With that being said I do not see any evidence right now of active infection which is great news. No fevers, chills, nausea, vomiting, or diarrhea. 11-10-2021 upon evaluation today patient appears to be doing well  currently in regard to his wound on the heel. Fortunately there does not appear to be any signs of active infection locally or systemically at this time which is great news. Fortunately I do  not see any evidence of infection which is also excellent. No fevers, chills, nausea, vomiting, or diarrhea. 12-08-2021 upon evaluation today patient appears to be doing well currently in regard to his heel although unfortunately has a right gluteal pressure ulcer. Fortunately there does not appear to be any signs of active infection locally or systemically which is great news and overall I am extremely pleased with where we stand today. 12-29-2021 upon evaluation patient's heel as well as gluteal area both appear to be doing better. There does not appear to be any signs of active infection locally or systemically at this time which is great news. No fevers, chills, nausea, vomiting, or diarrhea. 01-19-2022 upon evaluation today patient appears to be doing well with regard to his wound. He has been tolerating the dressing changes without complication. Fortunately there does not appear to be any evidence of active infection locally or systemically at this time which is great news. No fevers, chills, nausea, vomiting, or diarrhea. 02-16-2022 upon evaluation today patient appears to be doing well currently in regard to his wounds in general everything is healed although he has a new wound on the left heel which is something that was not present previous. Again this is a little disconcerting especially considering the other wounds have completely closed and we were hoping he be ready for discharge today. Again I do believe that this is something that looks like a pressure injury stage III. 03-14-2022 upon evaluation today patient appears to be doing poorly in regard to both his heels as well as his gluteal region. Unfortunately this has reopened now as a result of excess pressure and he does not seem to be offloaded as  well as it should be. Unfortunately I do think that this is due to lack of turning and repositioning. There is nothing else that would account for the bruising that we are seeing at this point in regards to the deep tissue injury. I discussed that with the patient's caregiver with him today. I do think, to put in a call to the facility as well at [name] where he is currently residing and Gena Fray is the nurse there. Her phone number is [phone]. 03-21-2022 upon evaluation today patient's wounds are actually significantly better compared to last week's evaluation. Fortunately there does not appear to be any signs of infection locally or systemically which is great news and overall I am extremely pleased with where things stand currently. No fevers, chills, nausea, vomiting, or diarrhea. Objective Constitutional Well-nourished and well-hydrated in no acute distress. Vitals Time Taken: 9:50 AM, Height: 63 in, Weight: 135 lbs, BMI: 23.9, Temperature: 98.2 F, Pulse: 68 bpm, Respiratory Rate: 16 breaths/min, Blood Pressure: 83/53 mmHg. Respiratory normal breathing without difficulty. Psychiatric this patient is able to make decisions and demonstrates good insight into disease process. Alert and Oriented x 3. pleasant and cooperative. General Notes: Upon inspection patient's wound bed actually showed signs of excellent improvement in just 1 week's time. He is obviously been staying off of this and that is been making a big deal as far as an improvement is concerned in the overall appearance of his wounds. I am extremely happy with where things stand today. Integumentary (Hair, Skin) Wound #2 status is Open. Original cause of wound was Pressure Injury. The date acquired was: 11/07/2021. The wound has been in treatment 14 weeks. The wound is located on the Right Gluteus. The wound measures 1cm length x 1cm width x 0.1cm depth; 0.785cm^2 area and 0.079cm^3 volume.  There is Fat Layer  (Subcutaneous Tissue) exposed. There is no tunneling or undermining noted. There is a medium amount of serosanguineous drainage noted. There is small (1-33%) red, pink granulation within the wound bed. There is a large (67-100%) amount of necrotic tissue within the wound bed including Adherent Slough. Wound #3 status is Open. Original cause of wound was Pressure Injury. The date acquired was: 02/01/2022. The wound has been in treatment 4 weeks. The wound is located on the Left Calcaneus. The wound measures 2cm length x 2cm width x 0.1cm depth; 3.142cm^2 area and 0.314cm^3 volume. There is Fat Layer (Subcutaneous Tissue) exposed. There is no tunneling or undermining noted. There is a none present amount of drainage noted. There is no granulation within the wound bed. There is a large (67-100%) amount of necrotic tissue within the wound bed including Eschar and Adherent Slough. Wound #4 status is Open. Original cause of wound was Pressure Injury. The date acquired was: 02/26/2022. The wound has been in treatment 1 weeks. The wound is located on the Left Gluteus. The wound measures 2.7cm length x 2.2cm width x 0.1cm depth; 4.665cm^2 area and 0.467cm^3 volume. There is Fat Layer (Subcutaneous Tissue) exposed. There is no tunneling or undermining noted. There is a medium amount of serosanguineous drainage noted. There is medium (34-66%) red, pink granulation within the wound bed. There is a medium (34-66%) amount of necrotic tissue within the wound bed including Eschar and Adherent Slough. Wound #5 status is Open. Original cause of wound was Pressure Injury. The date acquired was: 02/26/2022. The wound has been in treatment 1 weeks. The wound is located on the Right Calcaneus. The wound measures 0cm length x 0cm width x 0cm depth; 0cm^2 area and 0cm^3 volume. There is no tunneling or undermining noted. There is a none present amount of drainage noted. There is no granulation within the wound bed. There is no  necrotic tissue within the wound bed. Assessment [name], [name] ([id number]) [id number] Page 6 of 7 Active Problems ICD-10 Pressure ulcer of left heel, stage 3 Pressure ulcer of right buttock, unstageable Other cerebral palsy Other specified peripheral vascular diseases Chronic venous hypertension (idiopathic) with other complications of bilateral lower extremity Plan Follow-up Appointments: Return Appointment in 1 week. WOUND #2: - Gluteus Wound Laterality: Right Prim Dressing: Silvercel 4 1/4x 4 1/4 (in/in) 1 x Per Day/30 Days ary Discharge Instructions: Apply Silvercel 4 1/4x 4 1/4 (in/in) as instructed Prim Dressing: (Border) Zetuvit Plus Silicone Border Dressing 5x5 (in/in) 1 x Per Day/30 Days ary WOUND #3: - Calcaneus Wound Laterality: Left Prim Dressing: Silvercel 4 1/4x 4 1/4 (in/in) 1 x Per Day/30 Days ary Discharge Instructions: Apply Silvercel 4 1/4x 4 1/4 (in/in) as instructed Secondary Dressing: ABD Pad 5x9 (in/in) 1 x Per Day/30 Days Discharge Instructions: Cover with ABD pad Secured With: Kerlix Roll Sterile or Non-Sterile 6-ply 4.5x4 (yd/yd) 1 x Per Day/30 Days Discharge Instructions: Apply Kerlix as directed WOUND #4: - Gluteus Wound Laterality: Left Prim Dressing: Silvercel 4 1/4x 4 1/4 (in/in) 1 x Per Day/30 Days ary Discharge Instructions: Apply Silvercel 4 1/4x 4 1/4 (in/in) as instructed Prim Dressing: (Border) Zetuvit Plus Silicone Border Dressing 5x5 (in/in) 1 x Per Day/30 Days ary 1. I would recommend currently that we have the patient going to continue to monitor for any evidence of infection or worsening. Obviously if anything changes patient should contact the office and let me know. 2. I am also going to recommend that we have the patient continue  to utilize the silver alginate dressings which I really think is doing quite well. 3. I am also going to suggest patient should continue to be appropriately offloaded that is  going to be of utmost concern. I think that right now that in fact is one of the biggest concerns. We will see patient back for reevaluation in 1 week here in the clinic. If anything worsens or changes patient will contact our office for additional recommendations. Electronic Signature(s) Signed: 03/21/2022 10:34:33 AM By: Worthy Keeler PA-C Entered By: Worthy Keeler on 03/21/2022 10:34:33 -------------------------------------------------------------------------------- SuperBill Details Patient Name: Date of Service: Kevin Custard A. 03/21/2022 Medical Record Number: [id number] Patient Account Number: [id number] Date of Birth/Sex: Treating RN: 1940-06-03 (81 y.o. [name]) Carlene Coria Primary Care Provider: Steele Sizer Other Clinician: Referring Provider: Treating Provider/Extender: Osborne Oman Weeks in Treatment: 20 Diagnosis Coding ICD-10 Codes Code Description [phone] Pressure ulcer of left heel, stage 3 L89.310 Pressure ulcer of right buttock, unstageable G80.8 Other cerebral palsy I73.89 Other specified peripheral vascular diseases JOHARI, PINNEY ([id number]) [id number] Page 7 of 7 I87.393 Chronic venous hypertension (idiopathic) with other complications of bilateral lower extremity Facility Procedures : CPT4 Code: [id number] Description: 83779 - WOUND CARE VISIT-LEV 4 EST PT Modifier: Quantity: 1 Physician Procedures : CPT4 Code Description Modifier [id number] 84720 - WC PHYS LEVEL 3 - EST PT ICD-10 Diagnosis Description L89.623 Pressure ulcer of left heel, stage 3 L89.310 Pressure ulcer of right buttock, unstageable G80.8 Other cerebral palsy I73.89 Other specified  peripheral vascular diseases Quantity: 1 Electronic Signature(s) Signed: 03/21/2022 10:35:42 AM By: Worthy Keeler PA-C Previous Signature: 03/21/2022 10:17:20 AM Version By: Carlene Coria RN Entered By: Worthy Keeler on 03/21/2022 10:35:42

## 2022-03-21 NOTE — Progress Notes (Addendum)
[name], [name] ([id number]) [id number] Page 1 of 13 Visit Report for 03/21/2022 Arrival Information Details Patient Name: Date of Service: Kevin Custard A. 03/21/2022 9:30 A M Medical Record Number: [id number] Patient Account Number: [id number] Date of Birth/Sex: Treating RN: 07-14-1940 (81 y.o. [name]) Carlene Coria Primary Care Shakisha Abend: Steele Sizer Other Clinician: Referring Sabir Charters: Treating Oryan Winterton/Extender: Osborne Oman Weeks in Treatment: 72 Visit Information History Since Last Visit All ordered tests and consults were completed: No Patient Arrived: Wheel Chair Added or deleted any medications: No Arrival Time: 09:30 Any new allergies or adverse reactions: No Accompanied By: caregiver Had a fall or experienced change in No Transfer Assistance: None activities of daily living that may affect Patient Identification Verified: Yes risk of falls: Secondary Verification Process Completed: Yes Signs or symptoms of abuse/neglect since last visito No Patient Requires Transmission-Based Precautions: No Hospitalized since last visit: No Patient Has Alerts: No Implantable device outside of the clinic excluding No cellular tissue based products placed in the center since last visit: Has Dressing in Place as Prescribed: Yes Pain Present Now: No Electronic Signature(s) Signed: 03/21/2022 9:50:26 AM By: Carlene Coria RN Entered By: Carlene Coria on 03/21/2022 09:50:26 -------------------------------------------------------------------------------- Clinic Level of Care Assessment Details Patient Name: Date of Service: [name] 03/21/2022 9:30 A M Medical Record Number: [id number] Patient Account Number: [id number] Date of Birth/Sex: Treating RN: Oct 11, 1940 (81 y.o. [name]) Carlene Coria Primary Care Jonnell Hentges: Steele Sizer Other Clinician: Referring Wise Fees: Treating Reshaun Briseno/Extender: Osborne Oman Weeks in  Treatment: 20 Clinic Level of Care Assessment Items TOOL 4 Quantity Score X- 1 0 Use when only an EandM is performed on FOLLOW-UP visit ASSESSMENTS - Nursing Assessment / Reassessment X- 1 10 Reassessment of Co-morbidities (includes updates in patient status) X- 1 5 Reassessment of Adherence to Treatment Plan DAELYN, Macias ([id number]) [id number] Page 2 of 13 ASSESSMENTS - Wound and Skin A ssessment / Reassessment '[]'$  - 0 Simple Wound Assessment / Reassessment - one wound X- 4 5 Complex Wound Assessment / Reassessment - multiple wounds '[]'$  - 0 Dermatologic / Skin Assessment (not related to wound area) ASSESSMENTS - Focused Assessment '[]'$  - 0 Circumferential Edema Measurements - multi extremities '[]'$  - 0 Nutritional Assessment / Counseling / Intervention '[]'$  - 0 Lower Extremity Assessment (monofilament, tuning fork, pulses) '[]'$  - 0 Peripheral Arterial Disease Assessment (using hand held doppler) ASSESSMENTS - Ostomy and/or Continence Assessment and Care '[]'$  - 0 Incontinence Assessment and Management '[]'$  - 0 Ostomy Care Assessment and Management (repouching, etc.) PROCESS - Coordination of Care X - Simple Patient / Family Education for ongoing care 1 15 '[]'$  - 0 Complex (extensive) Patient / Family Education for ongoing care '[]'$  - 0 Staff obtains Programmer, systems, Records, T Results / Process Orders est '[]'$  - 0 Staff telephones HHA, Nursing Homes / Clarify orders / etc '[]'$  - 0 Routine Transfer to another Facility (non-emergent condition) '[]'$  - 0 Routine Hospital Admission (non-emergent condition) '[]'$  - 0 New Admissions / Biomedical engineer / Ordering NPWT Apligraf, etc. , '[]'$  - 0 Emergency Hospital Admission (emergent condition) X- 1 10 Simple Discharge Coordination '[]'$  - 0 Complex (extensive) Discharge Coordination PROCESS - Special Needs '[]'$  - 0 Pediatric / Minor Patient Management '[]'$  - 0 Isolation Patient Management '[]'$  - 0 Hearing / Language /  Visual special needs '[]'$  - 0 Assessment of Community assistance (transportation, D/C planning, etc.) '[]'$  - 0 Additional assistance / Altered mentation '[]'$  - 0 Support Surface(s) Assessment (bed, cushion, seat, etc.)  INTERVENTIONS - Wound Cleansing / Measurement '[]'$  - 0 Simple Wound Cleansing - one wound X- 4 5 Complex Wound Cleansing - multiple wounds X- 1 5 Wound Imaging (photographs - any number of wounds) '[]'$  - 0 Wound Tracing (instead of photographs) '[]'$  - 0 Simple Wound Measurement - one wound X- 4 5 Complex Wound Measurement - multiple wounds INTERVENTIONS - Wound Dressings X - Small Wound Dressing one or multiple wounds 3 10 '[]'$  - 0 Medium Wound Dressing one or multiple wounds '[]'$  - 0 Large Wound Dressing one or multiple wounds '[]'$  - 0 Application of Medications - topical '[]'$  - 0 Application of Medications - injection INTERVENTIONS - Miscellaneous '[]'$  - 0 External ear exam Kevin, Macias ([id number]) [id number] Page 3 of 13 '[]'$  - 0 Specimen Collection (cultures, biopsies, blood, body fluids, etc.) '[]'$  - 0 Specimen(s) / Culture(s) sent or taken to Lab for analysis '[]'$  - 0 Patient Transfer (multiple staff / Harrel Lemon Lift / Similar devices) '[]'$  - 0 Simple Staple / Suture removal (25 or less) '[]'$  - 0 Complex Staple / Suture removal (26 or more) '[]'$  - 0 Hypo / Hyperglycemic Management (close monitor of Blood Glucose) '[]'$  - 0 Ankle / Brachial Index (ABI) - do not check if billed separately X- 1 5 Vital Signs Has the patient been seen at the hospital within the last three years: Yes Total Score: 140 Level Of Care: New/Established - Level 4 Electronic Signature(s) Signed: 03/21/2022 4:00:23 PM By: Carlene Coria RN Entered By: Carlene Coria on 03/21/2022 10:17:15 -------------------------------------------------------------------------------- Encounter Discharge Information Details Patient Name: Date of Service: Kevin Gauze M A. 03/21/2022 9:30 A  M Medical Record Number: [id number] Patient Account Number: [id number] Date of Birth/Sex: Treating RN: Mar 18, 1941 (81 y.o. [name] Primary Care Zeola Brys: Steele Sizer Other Clinician: Referring Taquila Leys: Treating Trine Fread/Extender: Osborne Oman Weeks in Treatment: 20 Encounter Discharge Information Items Discharge Condition: Stable Ambulatory Status: Wheelchair Discharge Destination: Home Transportation: Private Auto Accompanied By: caregiver Schedule Follow-up Appointment: Yes Clinical Summary of Care: Electronic Signature(s) Signed: 03/21/2022 10:18:06 AM By: Carlene Coria RN Entered By: Carlene Coria on 03/21/2022 10:18:06 Lower Extremity Assessment Details -------------------------------------------------------------------------------- [name] ([id number]) [id number] Page 4 of 13 Patient Name: Date of Service: Kevin Custard A. 03/21/2022 9:30 A M Medical Record Number: [id number] Patient Account Number: [id number] Date of Birth/Sex: Treating RN: 06-30-1940 (81 y.o. [name]) Carlene Coria Primary Care Daneisha Surges: Steele Sizer Other Clinician: Referring Aubra Pappalardo: Treating Nassim Cosma/Extender: Osborne Oman Weeks in Treatment: 20 Electronic Signature(s) Signed: 03/21/2022 9:59:39 AM By: Carlene Coria RN Entered By: Carlene Coria on 03/21/2022 09:59:39 -------------------------------------------------------------------------------- Multi Wound Chart Details Patient Name: Date of Service: Kevin Custard A. 03/21/2022 9:30 A M Medical Record Number: [id number] Patient Account Number: [id number] Date of Birth/Sex: Treating RN: Apr 29, 1940 (81 y.o. [name]) Carlene Coria Primary Care Lakeria Starkman: Steele Sizer Other Clinician: Referring Shalene Gallen: Treating Zakkiyya Barno/Extender: Osborne Oman Weeks in Treatment: 20 Vital Signs Height(in): 63 Pulse(bpm): 68 Weight(lbs): 135 Blood Pressure(mmHg):  83/53 Body Mass Index(BMI): 23.9 Temperature(F): 98.2 Respiratory Rate(breaths/min): 16 [2:Photos:] Right Gluteus Left Calcaneus Left Gluteus Wound Location: Pressure Injury Pressure Injury Pressure Injury Wounding Event: Pressure Ulcer Pressure Ulcer Pressure Ulcer Primary Etiology: 11/07/2021 02/01/2022 02/26/2022 Date Acquired: '14 4 1 '$ Weeks of Treatment: Open Open Open Wound Status: No No No Wound Recurrence: 1x1x0.1 2x2x0.1 2.7x2.2x0.1 Measurements L x W x D (cm) 0.785 3.142 4.665 A (cm) : rea 0.079 0.314 0.467 Volume (cm) : 16.70% -699.50% -315.40% % Reduction in  A rea: 16.00% -297.50% -317.00% % Reduction in Volume: Category/Stage III Category/Stage III Category/Stage II Classification: Medium None Present Medium Exudate A mount: Serosanguineous N/A Serosanguineous Exudate Type: red, brown N/A red, brown Exudate Color: Small (1-33%) None Present (0%) Medium (34-66%) Granulation A mount: Red, Pink N/A Red, Pink Granulation Quality: Large (67-100%) Large (67-100%) Medium (34-66%) Necrotic A mount: Adherent Slough Eschar, Adherent Slough Eschar, Adherent Slough Necrotic Tissue: Fat Layer (Subcutaneous Tissue): Yes Fat Layer (Subcutaneous Tissue): Yes Fat Layer (Subcutaneous Tissue): Yes Exposed Structures: Fascia: No Fascia: No Fascia: No Tendon: No Tendon: No Tendon: No Muscle: No Muscle: No Muscle: No Joint: No Joint: No Joint: No MONTERIO, BOB ([id number]) [id number] Page 5 of 13 Bone: No Bone: No Bone: No Small (1-33%) N/A Small (1-33%) Epithelialization: Wound Number: 5 N/A N/A Photos: N/A N/A Right Calcaneus N/A N/A Wound Location: Pressure Injury N/A N/A Wounding Event: Pressure Ulcer N/A N/A Primary Etiology: 02/26/2022 N/A N/A Date Acquired: 1 N/A N/A Weeks of Treatment: Open N/A N/A Wound Status: No N/A N/A Wound Recurrence: 0x0x0 N/A N/A Measurements L x W x D (cm) 0 N/A N/A A (cm)  : rea 0 N/A N/A Volume (cm) : 100.00% N/A N/A % Reduction in A rea: 100.00% N/A N/A % Reduction in Volume: Category/Stage III N/A N/A Classification: None Present N/A N/A Exudate A mount: N/A N/A N/A Exudate Type: N/A N/A N/A Exudate Color: None Present (0%) N/A N/A Granulation A mount: N/A N/A N/A Granulation Quality: None Present (0%) N/A N/A Necrotic A mount: N/A N/A N/A Necrotic Tissue: Fascia: No N/A N/A Exposed Structures: Fat Layer (Subcutaneous Tissue): No Tendon: No Muscle: No Joint: No Bone: No Large (67-100%) N/A N/A Epithelialization: Treatment Notes Electronic Signature(s) Signed: 03/21/2022 10:00:37 AM By: Carlene Coria RN Entered By: Carlene Coria on 03/21/2022 10:00:37 -------------------------------------------------------------------------------- [place] Details Patient Name: Date of Service: Kevin Gauze M A. 03/21/2022 9:30 A M Medical Record Number: [id number] Patient Account Number: [id number] Date of Birth/Sex: Treating RN: Jul 19, 1940 (81 y.o. [name] Primary Care Liba Hulsey: Steele Sizer Other Clinician: Referring Rachana Malesky: Treating Demarrio Menges/Extender: Osborne Oman Weeks in Treatment: 20 Active Inactive Wound/Skin Impairment Nursing Diagnoses: Knowledge deficit related to ulceration/compromised skin integrity KIMBERLY, COYE ([id number]) [id number] Page 6 of 13 Goals: Patient/caregiver will verbalize understanding of skin care regimen Date Initiated: 10/27/2021 Target Resolution Date: 04/20/2022 Goal Status: Active Ulcer/skin breakdown will have a volume reduction of 30% by week 4 Date Initiated: 10/27/2021 Date Inactivated: 01/19/2022 Target Resolution Date: 11/27/2021 Goal Status: Unmet Unmet Reason: comordbities Ulcer/skin breakdown will have a volume reduction of 50% by week 8 Date Initiated: 10/27/2021 Date Inactivated: 01/19/2022 Target Resolution Date:  12/28/2021 Goal Status: Unmet Unmet Reason: comordbities Ulcer/skin breakdown will have a volume reduction of 80% by week 12 Date Initiated: 10/27/2021 Date Inactivated: 02/16/2022 Target Resolution Date: 01/27/2022 Goal Status: Unmet Unmet Reason: comorbdities Ulcer/skin breakdown will heal within 14 weeks Date Initiated: 10/27/2021 Date Inactivated: 03/21/2022 Target Resolution Date: 02/27/2022 Goal Status: Unmet Unmet Reason: comorbidities Interventions: Assess patient/caregiver ability to obtain necessary supplies Assess patient/caregiver ability to perform ulcer/skin care regimen upon admission and as needed Assess ulceration(s) every visit Notes: Electronic Signature(s) Signed: 03/21/2022 10:00:14 AM By: Carlene Coria RN Entered By: Carlene Coria on 03/21/2022 10:00:13 -------------------------------------------------------------------------------- Pain Assessment Details Patient Name: Date of Service: Kevin Custard A. 03/21/2022 9:30 A M Medical Record Number: [id number] Patient Account Number: [id number] Date of Birth/Sex: Treating RN: Oct 03, 1940 (81 y.o. [name]) Carlene Coria Primary Care Vinie Charity: Ancil Boozer,  Drue Stager Other Clinician: Referring Kymberlee Viger: Treating Jolena Kittle/Extender: Osborne Oman Weeks in Treatment: 20 Active Problems Location of Pain Severity and Description of Pain Patient Has Paino No Site Locations ARYON, NHAM A ([id number]) [id number] Page 7 of 13 Pain Management and Medication Current Pain Management: Electronic Signature(s) Signed: 03/21/2022 9:51:11 AM By: Carlene Coria RN Entered By: Carlene Coria on 03/21/2022 09:51:10 -------------------------------------------------------------------------------- Patient/Caregiver Education Details Patient Name: Date of Service: [name] 11/28/2023andnbsp9:30 A M Medical Record Number: [id number] Patient Account Number: [id number] Date of Birth/Gender:  Treating RN: [birth year] (81 y.o. [name] Primary Care Physician: Steele Sizer Other Clinician: Referring Physician: Treating Physician/Extender: Rogelio Seen in Treatment: 20 Education Assessment Education Provided To: Patient Education Topics Provided Wound/Skin Impairment: Methods: Explain/Verbal Responses: State content correctly [employer]) Signed: 03/21/2022 4:00:23 PM By: Carlene Coria RN Entered By: Carlene Coria on 03/21/2022 10:17:28 -------------------------------------------------------------------------------- Wound Assessment Details Patient Name: Date of Service: Kevin Custard A. 03/21/2022 9:30 A M Medical Record Number: [id number] Patient Account Number: [id number] Date of Birth/Sex: Treating RN: 03-24-1941 (81 y.o. [name] Primary Care Emillia Weatherly: Steele Sizer Other Clinician: Referring Ellajane Stong: Treating Amaris Delafuente/Extender: Osborne Oman Weeks in Treatment: 20 Wound Status Wound Number: 2 Primary Etiology: Pressure Ulcer Wound Location: Right Gluteus Wound Status: Open LAROY, MUSTARD ([id number]) [id number] Page 8 of 13 Wounding Event: Pressure Injury Date Acquired: 11/07/2021 Weeks Of Treatment: 14 Clustered Wound: No Photos Wound Measurements Length: (cm) 1 Width: (cm) 1 Depth: (cm) 0.1 Area: (cm) 0.785 Volume: (cm) 0.079 % Reduction in Area: 16.7% % Reduction in Volume: 16% Epithelialization: Small (1-33%) Tunneling: No Undermining: No Wound Description Classification: Category/Stage III Exudate Amount: Medium Exudate Type: Serosanguineous Exudate Color: red, brown Foul Odor After Cleansing: No Slough/Fibrino Yes Wound Bed Granulation Amount: Small (1-33%) Exposed Structure Granulation Quality: Red, Pink Fascia Exposed: No Necrotic Amount: Large (67-100%) Fat Layer (Subcutaneous Tissue) Exposed: Yes Necrotic Quality: Adherent  Slough Tendon Exposed: No Muscle Exposed: No Joint Exposed: No Bone Exposed: No Treatment Notes Wound #2 (Gluteus) Wound Laterality: Right Cleanser Peri-Wound Care Topical Primary Dressing Silvercel 4 1/4x 4 1/4 (in/in) Discharge Instruction: Apply Silvercel 4 1/4x 4 1/4 (in/in) as instructed (Border) Zetuvit Plus Silicone Border Dressing 5x5 (in/in) Secondary Dressing Secured With Compression Wrap Compression Stockings Add-Ons Electronic Signature(s) Signed: 03/21/2022 9:54:24 AM By: Carlene Coria RN Entered By: Carlene Coria on 03/21/2022 09:54:24 [name] ([id number]) [id number] Page 9 of 13 -------------------------------------------------------------------------------- Wound Assessment Details Patient Name: Date of Service: Kevin Custard A. 03/21/2022 9:30 A M Medical Record Number: [id number] Patient Account Number: [id number] Date of Birth/Sex: Treating RN: [birth year] (81 y.o. [name]) Carlene Coria Primary Care Kimberle Stanfill: Steele Sizer Other Clinician: Referring Virginio Isidore: Treating Lavona Norsworthy/Extender: Osborne Oman Weeks in Treatment: 20 Wound Status Wound Number: 3 Primary Etiology: Pressure Ulcer Wound Location: Left Calcaneus Wound Status: Open Wounding Event: Pressure Injury Date Acquired: 02/01/2022 Weeks Of Treatment: 4 Clustered Wound: No Photos Wound Measurements Length: (cm) 2 Width: (cm) 2 Depth: (cm) 0.1 Area: (cm) 3.142 Volume: (cm) 0.314 % Reduction in Area: -699.5% % Reduction in Volume: -297.5% Tunneling: No Undermining: No Wound Description Classification: Category/Stage III Exudate Amount: None Present Foul Odor After Cleansing: No Slough/Fibrino Yes Wound Bed Granulation Amount: None Present (0%) Exposed Structure Necrotic Amount: Large (67-100%) Fascia Exposed: No Necrotic Quality: Eschar, Adherent Slough Fat Layer (Subcutaneous Tissue) Exposed: Yes Tendon Exposed: No Muscle  Exposed: No Joint Exposed: No Bone Exposed: No Treatment Notes Wound #3 (  Calcaneus) Wound Laterality: Left Cleanser Peri-Wound Care Topical Primary Dressing HARSHAL, SIRMON ([id number]) [id number] Page 10 of 13 Silvercel 4 1/4x 4 1/4 (in/in) Discharge Instruction: Apply Silvercel 4 1/4x 4 1/4 (in/in) as instructed Secondary Dressing ABD Pad 5x9 (in/in) Discharge Instruction: Cover with ABD pad Secured With Kerlix Roll Sterile or Non-Sterile 6-ply 4.5x4 (yd/yd) Discharge Instruction: Apply Kerlix as directed Compression Wrap Compression Stockings Add-Ons Electronic Signature(s) Signed: 03/21/2022 9:55:31 AM By: Carlene Coria RN Entered By: Carlene Coria on 03/21/2022 09:55:31 -------------------------------------------------------------------------------- Wound Assessment Details Patient Name: Date of Service: Kevin Custard A. 03/21/2022 9:30 A M Medical Record Number: [id number] Patient Account Number: [id number] Date of Birth/Sex: Treating RN: [birth year] (81 y.o. [name]) Carlene Coria Primary Care Mose Colaizzi: Steele Sizer Other Clinician: Referring Tahja Liao: Treating Stein Windhorst/Extender: Osborne Oman Weeks in Treatment: 20 Wound Status Wound Number: 4 Primary Etiology: Pressure Ulcer Wound Location: Left Gluteus Wound Status: Open Wounding Event: Pressure Injury Date Acquired: 02/26/2022 Weeks Of Treatment: 1 Clustered Wound: No Photos Wound Measurements Length: (cm) 2.7 Width: (cm) 2.2 Depth: (cm) 0.1 Area: (cm) 4.665 Volume: (cm) 0.467 % Reduction in Area: -315.4% % Reduction in Volume: -317% Epithelialization: Small (1-33%) Tunneling: No Undermining: No Wound Description Classification: Category/Stage II YON, SCHIFFMAN ([id number]) Exudate Amount: Medium Exudate Type: Serosanguineous Exudate Color: red, brown Foul Odor After Cleansing: No [id number] Page 11 of 13 Slough/Fibrino  Yes Wound Bed Granulation Amount: Medium (34-66%) Exposed Structure Granulation Quality: Red, Pink Fascia Exposed: No Necrotic Amount: Medium (34-66%) Fat Layer (Subcutaneous Tissue) Exposed: Yes Necrotic Quality: Eschar, Adherent Slough Tendon Exposed: No Muscle Exposed: No Joint Exposed: No Bone Exposed: No Treatment Notes Wound #4 (Gluteus) Wound Laterality: Left Cleanser Peri-Wound Care Topical Primary Dressing Silvercel 4 1/4x 4 1/4 (in/in) Discharge Instruction: Apply Silvercel 4 1/4x 4 1/4 (in/in) as instructed (Border) Zetuvit Plus Silicone Border Dressing 5x5 (in/in) Secondary Dressing Secured With Compression Wrap Compression Stockings Add-Ons Electronic Signature(s) Signed: 03/21/2022 9:56:45 AM By: Carlene Coria RN Entered By: Carlene Coria on 03/21/2022 09:56:45 -------------------------------------------------------------------------------- Wound Assessment Details Patient Name: Date of Service: Kevin Custard A. 03/21/2022 9:30 A M Medical Record Number: [id number] Patient Account Number: [id number] Date of Birth/Sex: Treating RN: 09/11/1940 (81 y.o. [name] Primary Care Jenasia Dolinar: Steele Sizer Other Clinician: Referring Janayla Marik: Treating Tineka Uriegas/Extender: Osborne Oman Weeks in Treatment: 20 Wound Status Wound Number: 5 Primary Etiology: Pressure Ulcer Wound Location: Right Calcaneus Wound Status: Open Wounding Event: Pressure Injury Date Acquired: 02/26/2022 Weeks Of Treatment: 1 Clustered Wound: No Photos DONZEL, ROMACK ([id number]) [id number] Page 12 of 13 Wound Measurements Length: (cm) Width: (cm) Depth: (cm) Area: (cm) Volume: (cm) 0 % Reduction in Area: 100% 0 % Reduction in Volume: 100% 0 Epithelialization: Large (67-100%) 0 Tunneling: No 0 Undermining: No Wound Description Classification: Category/Stage III Exudate Amount: None Present Foul Odor After Cleansing:  No Slough/Fibrino No Wound Bed Granulation Amount: None Present (0%) Exposed Structure Necrotic Amount: None Present (0%) Fascia Exposed: No Fat Layer (Subcutaneous Tissue) Exposed: No Tendon Exposed: No Muscle Exposed: No Joint Exposed: No Bone Exposed: No Electronic Signature(s) Signed: 03/21/2022 9:57:54 AM By: Carlene Coria RN Previous Signature: 03/21/2022 9:57:08 AM Version By: Carlene Coria RN Entered By: Carlene Coria on 03/21/2022 09:57:53 -------------------------------------------------------------------------------- [place] Details Patient Name: Date of Service: Kevin Gauze M A. 03/21/2022 9:30 A M Medical Record Number: [id number] Patient Account Number: [id number] Date of Birth/Sex: Treating RN: Sep 21, 1940 (81 y.o. [name] Primary Care Goodwin Kamphaus: Steele Sizer Other Clinician: Referring Lauriana Denes:  Treating Blake Vetrano/Extender: Osborne Oman Weeks in Treatment: 20 Vital Signs Time Taken: 09:50 Temperature (F): 98.2 Height (in): 63 Pulse (bpm): 68 Weight (lbs): 135 Respiratory Rate (breaths/min): 16 Body Mass Index (BMI): 23.9 Blood Pressure (mmHg): 83/53 Reference Range: 80 - 120 mg / dl Electronic Signature(s) Signed: 03/21/2022 9:50:54 AM By: Carlene Coria RN Entered By: Carlene Coria on 03/21/2022 09:50:54 [name] ([id number]) [id number] Page 13 of 13

## 2022-03-22 ENCOUNTER — Other Ambulatory Visit: Payer: Medicare Other

## 2022-03-22 ENCOUNTER — Ambulatory Visit: Payer: Medicare Other | Admitting: Oncology

## 2022-03-23 ENCOUNTER — Ambulatory Visit: Payer: Medicare Other | Admitting: Physician Assistant

## 2022-03-31 ENCOUNTER — Other Ambulatory Visit: Payer: Self-pay

## 2022-03-31 DIAGNOSIS — D72819 Decreased white blood cell count, unspecified: Secondary | ICD-10-CM

## 2022-03-31 DIAGNOSIS — D696 Thrombocytopenia, unspecified: Secondary | ICD-10-CM

## 2022-04-03 ENCOUNTER — Inpatient Hospital Stay: Payer: Medicare Other | Admitting: Oncology

## 2022-04-03 ENCOUNTER — Inpatient Hospital Stay: Payer: Medicare Other

## 2022-04-04 ENCOUNTER — Encounter: Payer: Medicare Other | Attending: Physician Assistant | Admitting: Physician Assistant

## 2022-04-04 DIAGNOSIS — L89310 Pressure ulcer of right buttock, unstageable: Secondary | ICD-10-CM | POA: Insufficient documentation

## 2022-04-04 DIAGNOSIS — L89623 Pressure ulcer of left heel, stage 3: Secondary | ICD-10-CM | POA: Insufficient documentation

## 2022-04-04 DIAGNOSIS — G809 Cerebral palsy, unspecified: Secondary | ICD-10-CM | POA: Diagnosis not present

## 2022-04-04 DIAGNOSIS — I739 Peripheral vascular disease, unspecified: Secondary | ICD-10-CM | POA: Insufficient documentation

## 2022-04-04 DIAGNOSIS — I872 Venous insufficiency (chronic) (peripheral): Secondary | ICD-10-CM | POA: Insufficient documentation

## 2022-04-04 NOTE — Progress Notes (Signed)
MARTAVIOUS, HARTEL ([id number]) [id number] Page 1 of 7 Visit Report for 04/04/2022 Chief Complaint Document Details Patient Name: Date of Service: Kevin Custard A. 04/04/2022 10:00 A M Medical Record Number: [id number] Patient Account Number: [id number] Date of Birth/Sex: Treating RN: 1940/11/24 (81 y.o. Seward Meth Primary Care Provider: Steele Sizer Other Clinician: Referring Provider: Treating Provider/Extender: Osborne Oman Weeks in Treatment: 22 Information Obtained from: Patient Chief Complaint Left heel pressure ulcer and right buttock pressure ulcer Electronic Signature(s) Signed: 04/04/2022 10:30:54 AM By: Worthy Keeler PA-C Entered By: Worthy Keeler on 04/04/2022 10:30:54 -------------------------------------------------------------------------------- HPI Details Patient Name: Date of Service: Kevin Custard A. 04/04/2022 10:00 [place] Record Number: [id number] Patient Account Number: [id number] Date of Birth/Sex: Treating RN: 02-18-1941 (81 y.o. Seward Meth Primary Care Provider: Steele Sizer Other Clinician: Referring Provider: Treating Provider/Extender: Osborne Oman Weeks in Treatment: 22 History of Present Illness HPI Description: 10-27-2021 upon evaluation today patient presents for initial evaluation here in the clinic concerning an issue that has been present since around June 1. Subsequently the patient's caregiver from [name] tells me that he has been treated mainly by herself she has been doing what she can to try to take care of this using DuoDERM over the area. There is a lot of skin that is actually loosen up over the top of the heel surface but not all of this is necessarily covering wound in fact there is a majority of this that is not wound related as far as the skin over top is concerned. Fortunately I do not see any signs of active infection locally or systemically  at this time which is great news. He does have some slough over the surface of the wound but mainly the skin around the edges the thing I am most concerned with. It is trapping a lot of fluid to be honest. Patient does have a history of chronic venous insufficiency, cerebral palsy, and stated peripheral vascular disease. With that being said I do not see any evidence right now of active infection which is great news. No fevers, chills, nausea, vomiting, or diarrhea. 11-10-2021 upon evaluation today patient appears to be doing well currently in regard to his wound on the heel. Fortunately there does not appear to be any signs of active infection locally or systemically at this time which is great news. Fortunately I do not see any evidence of infection which is also excellent. No fevers, chills, nausea, vomiting, or diarrhea. 12-08-2021 upon evaluation today patient appears to be doing well currently in regard to his heel although unfortunately has a right gluteal pressure ulcer. Fortunately there does not appear to be any signs of active infection locally or systemically which is great news and overall I am extremely pleased with where we stand today. [name], [name] ([id number]) [id number] Page 2 of 7 12-29-2021 upon evaluation patient's heel as well as gluteal area both appear to be doing better. There does not appear to be any signs of active infection locally or systemically at this time which is great news. No fevers, chills, nausea, vomiting, or diarrhea. 01-19-2022 upon evaluation today patient appears to be doing well with regard to his wound. He has been tolerating the dressing changes without complication. Fortunately there does not appear to be any evidence of active infection locally or systemically at this time which is great news. No fevers, chills, nausea, vomiting, or diarrhea. 02-16-2022 upon evaluation today patient appears to be  doing well currently in  regard to his wounds in general everything is healed although he has a new wound on the left heel which is something that was not present previous. Again this is a little disconcerting especially considering the other wounds have completely closed and we were hoping he be ready for discharge today. Again I do believe that this is something that looks like a pressure injury stage III. 03-14-2022 upon evaluation today patient appears to be doing poorly in regard to both his heels as well as his gluteal region. Unfortunately this has reopened now as a result of excess pressure and he does not seem to be offloaded as well as it should be. Unfortunately I do think that this is due to lack of turning and repositioning. There is nothing else that would account for the bruising that we are seeing at this point in regards to the deep tissue injury. I discussed that with the patient's caregiver with him today. I do think, to put in a call to the facility as well at [name] where he is currently residing and [name] is the nurse there. Her phone number is [phone]. 03-21-2022 upon evaluation today patient's wounds are actually significantly better compared to last week's evaluation. Fortunately there does not appear to be any signs of infection locally or systemically which is great news and overall I am extremely pleased with where things stand currently. No fevers, chills, nausea, vomiting, or diarrhea. 04-04-2022 upon evaluation today patient appears to be doing well currently based on what I am seeing in regard to his heel wound this is pretty dry and I think it is probably pretty much healed. Fortunately I do not see any evidence of active infection locally nor systemically at this time. In regard to the gluteal wound this is showing signs of excellent improvement. Electronic Signature(s) Signed: 04/04/2022 11:01:42 AM By: Worthy Keeler PA-C Entered By: Worthy Keeler on 04/04/2022  11:01:42 -------------------------------------------------------------------------------- Physical Exam Details Patient Name: Date of Service: Kevin Custard A. 04/04/2022 10:00 A M Medical Record Number: [id number] Patient Account Number: [id number] Date of Birth/Sex: Treating RN: June 30, 1940 (81 y.o. Seward Meth Primary Care Provider: Steele Sizer Other Clinician: Referring Provider: Treating Provider/Extender: Osborne Oman Weeks in Treatment: 68 Constitutional Well-nourished and well-hydrated in no acute distress. Respiratory normal breathing without difficulty. Psychiatric this patient is able to make decisions and demonstrates good insight into disease process. Alert and Oriented x 3. pleasant and cooperative. Notes Both wounds seem to be making good progress and I am very pleased in that regard. I do not see any evidence of active infection at this time which is great news. Electronic Signature(s) Signed: 04/04/2022 11:01:54 AM By: Worthy Keeler PA-C Entered By: Worthy Keeler on 04/04/2022 11:01:53 [name] ([id number]) [id number] Page 3 of 7 -------------------------------------------------------------------------------- Physician Orders Details Patient Name: Date of Service: Kevin Custard A. 04/04/2022 10:00 A M Medical Record Number: [id number] Patient Account Number: [id number] Date of Birth/Sex: Treating RN: [birth year] (81 y.o. Seward Meth Primary Care Provider: Steele Sizer Other Clinician: Referring Provider: Treating Provider/Extender: Osborne Oman Weeks in Treatment: 22 Verbal / Phone Orders: No Diagnosis Coding ICD-10 Coding Code Description [phone] Pressure ulcer of left heel, stage 3 L89.310 Pressure ulcer of right buttock, unstageable G80.8 Other cerebral palsy I73.89 Other specified peripheral vascular diseases I87.393 Chronic venous hypertension (idiopathic)  with other complications of bilateral lower extremity Follow-up Appointments Return Appointment in 1  week. Wound Treatment Wound #2 - Gluteus Wound Laterality: Right Prim Dressing: Silvercel 4 1/4x 4 1/4 (in/in) 1 x Per Day/30 Days ary Discharge Instructions: Apply Silvercel 4 1/4x 4 1/4 (in/in) as instructed Prim Dressing: (Border) Zetuvit Plus Silicone Border Dressing 5x5 (in/in) ary 1 x Per Day/30 Days Wound #3 - Calcaneus Wound Laterality: Left Topical: Betadine 1 x Per Day/30 Days Discharge Instructions: Apply betadine as directed. Secondary Dressing: ABD Pad 5x9 (in/in) 1 x Per Day/30 Days Discharge Instructions: Cover with ABD pad Secured With: Kerlix Roll Sterile or Non-Sterile 6-ply 4.5x4 (yd/yd) 1 x Per Day/30 Days Discharge Instructions: Apply Kerlix as directed Wound #4 - Gluteus Wound Laterality: Left Prim Dressing: Silvercel 4 1/4x 4 1/4 (in/in) 1 x Per Day/30 Days ary Discharge Instructions: Apply Silvercel 4 1/4x 4 1/4 (in/in) as instructed Prim Dressing: (Border) Zetuvit Plus Silicone Border Dressing 5x5 (in/in) ary 1 x Per Day/30 Days Electronic Signature(s) Signed: 04/04/2022 4:43:27 PM By: Rosalio Loud MSN RN CNS WTA Signed: 04/04/2022 5:45:58 PM By: Worthy Keeler PA-C Previous Signature: 04/04/2022 10:47:21 AM Version By: Rosalio Loud MSN RN CNS WTA Entered By: Rosalio Loud on 04/04/2022 16:43:27 [name] ([id number]) [id number] Page 4 of 7 -------------------------------------------------------------------------------- Problem List Details Patient Name: Date of Service: Kevin Custard A. 04/04/2022 10:00 A M Medical Record Number: [id number] Patient Account Number: [id number] Date of Birth/Sex: Treating RN: 12/05/1940 (81 y.o. Seward Meth Primary Care Provider: Steele Sizer Other Clinician: Referring Provider: Treating Provider/Extender: Osborne Oman Weeks in Treatment: 22 Active  Problems ICD-10 Encounter Code Description Active Date MDM Diagnosis [phone] Pressure ulcer of left heel, stage 3 10/27/2021 No Yes L89.310 Pressure ulcer of right buttock, unstageable 12/08/2021 No Yes G80.8 Other cerebral palsy 10/27/2021 No Yes I73.89 Other specified peripheral vascular diseases 10/27/2021 No Yes I87.393 Chronic venous hypertension (idiopathic) with other complications of bilateral 10/27/2021 No Yes lower extremity Inactive Problems Resolved Problems Electronic Signature(s) Signed: 04/04/2022 10:30:50 AM By: Worthy Keeler PA-C Entered By: Worthy Keeler on 04/04/2022 10:30:50 -------------------------------------------------------------------------------- Progress Note Details Patient Name: Date of Service: Kevin Custard A. 04/04/2022 10:00 A M Medical Record Number: [id number] Patient Account Number: [id number] Date of Birth/Sex: Treating RN: 04/16/1941 (81 y.o. Sydnee Cabal, Jamal Collin ([id number]) [id number] Page 5 of 7 Primary Care Provider: Steele Sizer Other Clinician: Referring Provider: Treating Provider/Extender: Osborne Oman Weeks in Treatment: 22 Subjective Chief Complaint Information obtained from Patient Left heel pressure ulcer and right buttock pressure ulcer History of Present Illness (HPI) 10-27-2021 upon evaluation today patient presents for initial evaluation here in the clinic concerning an issue that has been present since around June 1. Subsequently the patient's caregiver from [name] tells me that he has been treated mainly by herself she has been doing what she can to try to take care of this using DuoDERM over the area. There is a lot of skin that is actually loosen up over the top of the heel surface but not all of this is necessarily covering wound in fact there is a majority of this that is not wound related as far as the skin over top is concerned. Fortunately I do not see any  signs of active infection locally or systemically at this time which is great news. He does have some slough over the surface of the wound but mainly the skin around the edges the thing I am most concerned with. It is trapping a lot of fluid to be  honest. Patient does have a history of chronic venous insufficiency, cerebral palsy, and stated peripheral vascular disease. With that being said I do not see any evidence right now of active infection which is great news. No fevers, chills, nausea, vomiting, or diarrhea. 11-10-2021 upon evaluation today patient appears to be doing well currently in regard to his wound on the heel. Fortunately there does not appear to be any signs of active infection locally or systemically at this time which is great news. Fortunately I do not see any evidence of infection which is also excellent. No fevers, chills, nausea, vomiting, or diarrhea. 12-08-2021 upon evaluation today patient appears to be doing well currently in regard to his heel although unfortunately has a right gluteal pressure ulcer. Fortunately there does not appear to be any signs of active infection locally or systemically which is great news and overall I am extremely pleased with where we stand today. 12-29-2021 upon evaluation patient's heel as well as gluteal area both appear to be doing better. There does not appear to be any signs of active infection locally or systemically at this time which is great news. No fevers, chills, nausea, vomiting, or diarrhea. 01-19-2022 upon evaluation today patient appears to be doing well with regard to his wound. He has been tolerating the dressing changes without complication. Fortunately there does not appear to be any evidence of active infection locally or systemically at this time which is great news. No fevers, chills, nausea, vomiting, or diarrhea. 02-16-2022 upon evaluation today patient appears to be doing well currently in regard to his wounds in general  everything is healed although he has a new wound on the left heel which is something that was not present previous. Again this is a little disconcerting especially considering the other wounds have completely closed and we were hoping he be ready for discharge today. Again I do believe that this is something that looks like a pressure injury stage III. 03-14-2022 upon evaluation today patient appears to be doing poorly in regard to both his heels as well as his gluteal region. Unfortunately this has reopened now as a result of excess pressure and he does not seem to be offloaded as well as it should be. Unfortunately I do think that this is due to lack of turning and repositioning. There is nothing else that would account for the bruising that we are seeing at this point in regards to the deep tissue injury. I discussed that with the patient's caregiver with him today. I do think, to put in a call to the facility as well at [name] where he is currently residing and [name] is the nurse there. Her phone number is [phone]. 03-21-2022 upon evaluation today patient's wounds are actually significantly better compared to last week's evaluation. Fortunately there does not appear to be any signs of infection locally or systemically which is great news and overall I am extremely pleased with where things stand currently. No fevers, chills, nausea, vomiting, or diarrhea. 04-04-2022 upon evaluation today patient appears to be doing well currently based on what I am seeing in regard to his heel wound this is pretty dry and I think it is probably pretty much healed. Fortunately I do not see any evidence of active infection locally nor systemically at this time. In regard to the gluteal wound this is showing signs of excellent improvement. Objective Constitutional Well-nourished and well-hydrated in no acute distress. Vitals Time Taken: 10:26 AM, Height: 63 in, Weight: 135 lbs, BMI:  23.9,  Temperature: 98.2 F, Pulse: 62 bpm, Respiratory Rate: 16 breaths/min, Blood Pressure: 98/78 mmHg. Respiratory normal breathing without difficulty. Psychiatric this patient is able to make decisions and demonstrates good insight into disease process. Alert and Oriented x 3. pleasant and cooperative. General Notes: Both wounds seem to be making good progress and I am very pleased in that regard. I do not see any evidence of active infection at this time which is great news. Integumentary (Hair, Skin) Wound #2 status is Open. Original cause of wound was Pressure Injury. The date acquired was: 11/07/2021. The wound has been in treatment 16 weeks. The wound is located on the Right Gluteus. The wound measures 0.3cm length x 0.2cm width x 0.1cm depth; 0.047cm^2 area and 0.005cm^3 volume. There is Fat Layer (Subcutaneous Tissue) exposed. There is a medium amount of serosanguineous drainage noted. There is small (1-33%) red, pink granulation within the wound bed. There is a large (67-100%) amount of necrotic tissue within the wound bed including Adherent Slough. Wound #3 status is Open. Original cause of wound was Pressure Injury. The date acquired was: 02/01/2022. The wound has been in treatment 6 weeks. The VORIS, TIGERT ([id number]) [id number] Page 6 of 7 wound is located on the Left Calcaneus. The wound measures 1.8cm length x 1.5cm width x 0.1cm depth; 2.121cm^2 area and 0.212cm^3 volume. There is Fat Layer (Subcutaneous Tissue) exposed. There is a none present amount of drainage noted. There is no granulation within the wound bed. There is a large (67- 100%) amount of necrotic tissue within the wound bed including Eschar and Adherent Slough. Wound #4 status is Open. Original cause of wound was Pressure Injury. The date acquired was: 02/26/2022. The wound has been in treatment 3 weeks. The wound is located on the Left Gluteus. The wound measures 0.5cm length x 1cm  width x 0.1cm depth; 0.393cm^2 area and 0.039cm^3 volume. There is Fat Layer (Subcutaneous Tissue) exposed. There is a medium amount of serosanguineous drainage noted. There is medium (34-66%) red, pink granulation within the wound bed. There is a medium (34-66%) amount of necrotic tissue within the wound bed including Eschar and Adherent Slough. Assessment Active Problems ICD-10 Pressure ulcer of left heel, stage 3 Pressure ulcer of right buttock, unstageable Other cerebral palsy Other specified peripheral vascular diseases Chronic venous hypertension (idiopathic) with other complications of bilateral lower extremity Plan Follow-up Appointments: Return Appointment in 1 week. WOUND #2: - Gluteus Wound Laterality: Right Prim Dressing: Silvercel 4 1/4x 4 1/4 (in/in) 1 x Per Day/30 Days ary Discharge Instructions: Apply Silvercel 4 1/4x 4 1/4 (in/in) as instructed Prim Dressing: (Border) Zetuvit Plus Silicone Border Dressing 5x5 (in/in) 1 x Per Day/30 Days ary WOUND #3: - Calcaneus Wound Laterality: Left Prim Dressing: Silvercel 4 1/4x 4 1/4 (in/in) 1 x Per Day/30 Days ary Discharge Instructions: Apply Silvercel 4 1/4x 4 1/4 (in/in) as instructed Secondary Dressing: ABD Pad 5x9 (in/in) 1 x Per Day/30 Days Discharge Instructions: Cover with ABD pad Secured With: Kerlix Roll Sterile or Non-Sterile 6-ply 4.5x4 (yd/yd) 1 x Per Day/30 Days Discharge Instructions: Apply Kerlix as directed WOUND #4: - Gluteus Wound Laterality: Left Prim Dressing: Silvercel 4 1/4x 4 1/4 (in/in) 1 x Per Day/30 Days ary Discharge Instructions: Apply Silvercel 4 1/4x 4 1/4 (in/in) as instructed Prim Dressing: (Border) Zetuvit Plus Silicone Border Dressing 5x5 (in/in) 1 x Per Day/30 Days ary 1. I am going to recommend based on what I am seeing that we have the patient to switch to Betadine  and just a dry gauze dressing over the heel in order to protect this. 2. I am also can recommend silver alginate and a  border foam dressing to the gluteal region. 3. I am also can recommend the patient should continue to monitor for any signs of infection or worsening. Obviously if anything changes he should let us know. We will see patient back for reevaluation in 1 week here in the clinic. If anything worsens or changes patient will contact our office for additional recommendations. Electronic Signature(s) Signed: 04/04/2022 11:02:27 AM By: Worthy Keeler PA-C Entered By: Worthy Keeler on 04/04/2022 11:02:27 -------------------------------------------------------------------------------- SuperBill Details Patient Name: Date of Service: Kevin Custard A. 04/04/2022 Medical Record Number: [id number] Patient Account Number: [id number] Date of Birth/Sex: Treating RN: [birth year] (81 y.o. Sydnee Cabal, Jamal Collin ([id number]) [id number] Page 7 of 7 Primary Care Provider: Steele Sizer Other Clinician: Referring Provider: Treating Provider/Extender: Osborne Oman Weeks in Treatment: 22 Diagnosis Coding ICD-10 Codes Code Description [id number] Pressure ulcer of left heel, stage 3 L89.310 Pressure ulcer of right buttock, unstageable G80.8 Other cerebral palsy I73.89 Other specified peripheral vascular diseases I87.393 Chronic venous hypertension (idiopathic) with other complications of bilateral lower extremity Facility Procedures : CPT4 Code: [id number] Description: 99214 - WOUND CARE VISIT-LEV 4 EST PT Modifier: Quantity: 1 Physician Procedures : CPT4 Code Description Modifier [id number] - WC PHYS LEVEL 3 - EST PT ICD-10 Diagnosis Description L89.623 Pressure ulcer of left heel, stage 3 L89.310 Pressure ulcer of right buttock, unstageable G80.8 Other cerebral palsy I73.89 Other specified  peripheral vascular diseases Quantity: 1 Electronic Signature(s) Signed: 04/04/2022 11:02:41 AM By: Worthy Keeler PA-C Entered By: Worthy Keeler on  04/04/2022 11:02:40

## 2022-04-05 NOTE — Progress Notes (Addendum)
Kevin, Macias ([id number]) [id number] Page 1 of 11 Visit Report for 04/04/2022 Arrival Information Details Patient Name: Date of Service: [name] 04/04/2022 10:00 A M Medical Record Number: [id number] Patient Account Number: [id number] Date of Birth/Sex: Treating RN: 1941-03-26 (81 y.o. [name] Primary Care [name]: Steele Sizer Other Clinician: Referring [name]: Treating [name]/Extender: Osborne Oman Weeks in Treatment: 53 Visit Information History Since Last Visit Added or deleted any medications: No Patient Arrived: Wheel Chair Any new allergies or adverse reactions: No Arrival Time: 10:26 Had a fall or experienced change in No Accompanied By: caretaker activities of daily living that may affect Transfer Assistance: Harrel Lemon Lift risk of falls: Patient Identification Verified: Yes Hospitalized since last visit: No Secondary Verification Process Completed: Yes Pain Present Now: No Patient Requires Transmission-Based Precautions: No Patient Has Alerts: No Electronic Signature(s) Signed: 04/04/2022 3:14:13 PM By: Rosalio Loud MSN RN CNS WTA Entered By: Rosalio Loud on 04/04/2022 15:14:13 -------------------------------------------------------------------------------- Clinic Level of Care Assessment Details Patient Name: Date of Service: Kevin Custard A. 04/04/2022 10:00 A M Medical Record Number: [id number] Patient Account Number: [id number] Date of Birth/Sex: Treating RN: [birth year] (81 y.o. [name] Primary Care Shimika Macias: Steele Sizer Other Clinician: Referring [name]: Treating [name]/Extender: Osborne Oman Weeks in Treatment: 22 Clinic Level of Care Assessment Items TOOL 4 Quantity Score X- 1 0 Use when only an EandM is performed on FOLLOW-UP visit ASSESSMENTS - Nursing Assessment / Reassessment X- 1 10 Reassessment of Co-morbidities (includes updates in patient  status) X- 1 5 Reassessment of Adherence to Treatment Plan ASSESSMENTS - Wound and Skin A ssessment / Reassessment '[]'$  - 0 Simple Wound Assessment / Reassessment - one wound X- 3 5 Complex Wound Assessment / Reassessment - multiple wounds KAMARII, CARTON ([id number]) [id number] Page 2 of 11 '[]'$  - 0 Dermatologic / Skin Assessment (not related to wound area) ASSESSMENTS - Focused Assessment '[]'$  - 0 Circumferential Edema Measurements - multi extremities '[]'$  - 0 Nutritional Assessment / Counseling / Intervention '[]'$  - 0 Lower Extremity Assessment (monofilament, tuning fork, pulses) '[]'$  - 0 Peripheral Arterial Disease Assessment (using hand held doppler) ASSESSMENTS - Ostomy and/or Continence Assessment and Care '[]'$  - 0 Incontinence Assessment and Management '[]'$  - 0 Ostomy Care Assessment and Management (repouching, etc.) PROCESS - Coordination of Care '[]'$  - 0 Simple Patient / Family Education for ongoing care '[]'$  - 0 Complex (extensive) Patient / Family Education for ongoing care X- 1 10 Staff obtains Programmer, systems, Records, T Results / Process Orders est '[]'$  - 0 Staff telephones HHA, Nursing Homes / Clarify orders / etc '[]'$  - 0 Routine Transfer to another Facility (non-emergent condition) '[]'$  - 0 Routine Hospital Admission (non-emergent condition) '[]'$  - 0 New Admissions / Biomedical engineer / Ordering NPWT Apligraf, etc. , '[]'$  - 0 Emergency Hospital Admission (emergent condition) '[]'$  - 0 Simple Discharge Coordination X- 1 15 Complex (extensive) Discharge Coordination PROCESS - Special Needs '[]'$  - 0 Pediatric / Minor Patient Management '[]'$  - 0 Isolation Patient Management '[]'$  - 0 Hearing / Language / Visual special needs '[]'$  - 0 Assessment of Community assistance (transportation, D/C planning, etc.) '[]'$  - 0 Additional assistance / Altered mentation '[]'$  - 0 Support Surface(s) Assessment (bed, cushion, seat, etc.) INTERVENTIONS - Wound Cleansing /  Measurement '[]'$  - 0 Simple Wound Cleansing - one wound X- 3 5 Complex Wound Cleansing - multiple wounds X- 1 5 Wound Imaging (photographs - any number of wounds) '[]'$  - 0 Wound Tracing (  instead of photographs) '[]'$  - 0 Simple Wound Measurement - one wound X- 3 5 Complex Wound Measurement - multiple wounds INTERVENTIONS - Wound Dressings X - Small Wound Dressing one or multiple wounds 3 10 '[]'$  - 0 Medium Wound Dressing one or multiple wounds '[]'$  - 0 Large Wound Dressing one or multiple wounds '[]'$  - 0 Application of Medications - topical '[]'$  - 0 Application of Medications - injection INTERVENTIONS - Miscellaneous '[]'$  - 0 External ear exam '[]'$  - 0 Specimen Collection (cultures, biopsies, blood, body fluids, etc.) '[]'$  - 0 Specimen(s) / Culture(s) sent or taken to Lab for analysis [name], [name] ([id number]) [id number] Page 3 of 11 '[]'$  - 0 Patient Transfer (multiple staff / Civil Service fast streamer / Similar devices) '[]'$  - 0 Simple Staple / Suture removal (25 or less) '[]'$  - 0 Complex Staple / Suture removal (26 or more) '[]'$  - 0 Hypo / Hyperglycemic Management (close monitor of Blood Glucose) '[]'$  - 0 Ankle / Brachial Index (ABI) - do not check if billed separately X- 1 5 Vital Signs Has the patient been seen at the hospital within the last three years: Yes Total Score: 125 Level Of Care: New/Established - Level 4 Electronic Signature(s) Signed: 04/04/2022 4:38:24 PM By: Rosalio Loud MSN RN CNS WTA Entered By: Rosalio Loud on 04/04/2022 10:48:36 -------------------------------------------------------------------------------- Encounter Discharge Information Details Patient Name: Date of Service: Kevin Gauze M A. 04/04/2022 10:00 [place] Record Number: [id number] Patient Account Number: [id number] Date of Birth/Sex: Treating RN: July 07, 1940 (81 y.o. [name] Primary Care [name]: Steele Sizer Other Clinician: Referring Story Vanvranken: Treating  [name]/Extender: Osborne Oman Weeks in Treatment: 22 Encounter Discharge Information Items Discharge Condition: Stable Ambulatory Status: Wheelchair Discharge Destination: Home Transportation: Private Auto Accompanied By: caretaker Schedule Follow-up Appointment: Yes Clinical Summary of Care: Electronic Signature(s) Signed: 04/04/2022 4:44:05 PM By: Rosalio Loud MSN RN CNS WTA Previous Signature: 04/04/2022 4:38:24 PM Version By: Rosalio Loud MSN RN CNS WTA Entered By: Rosalio Loud on 04/04/2022 16:44:05 -------------------------------------------------------------------------------- Lower Extremity Assessment Details Patient Name: Date of Service: Kevin Custard A. 04/04/2022 10:00 A M Medical Record Number: [id number] Patient Account Number: [id number] Date of Birth/Sex: Treating RN: May 13, 1940 (81 y.o. [name] Primary Care Brayton Baumgartner: Steele Sizer Other Clinician: LEIAM, [name] ([id number]) [id number] Page 4 of 11 Referring Matheau Orona: Treating Murtaza Shell/Extender: Osborne Oman Weeks in Treatment: 22 Electronic Signature(s) Signed: 04/04/2022 4:43:35 PM By: Rosalio Loud MSN RN CNS WTA Previous Signature: 04/04/2022 3:14:40 PM Version By: Rosalio Loud MSN RN CNS WTA Previous Signature: 04/04/2022 10:46:58 AM Version By: Rosalio Loud MSN RN CNS WTA Entered By: Rosalio Loud on 04/04/2022 16:43:35 -------------------------------------------------------------------------------- Multi Wound Chart Details Patient Name: Date of Service: Kevin Gauze M A. 04/04/2022 10:00 A M Medical Record Number: [id number] Patient Account Number: [id number] Date of Birth/Sex: Treating RN: August 21, 1940 (81 y.o. [name] Primary Care Dylen Mcelhannon: Steele Sizer Other Clinician: Referring Stephfon Bovey: Treating Argil Mahl/Extender: Osborne Oman Weeks in Treatment: 22 Vital Signs Height(in): 63 Pulse(bpm):  62 Weight(lbs): 135 Blood Pressure(mmHg): 98/78 Body Mass Index(BMI): 23.9 Temperature(F): 98.2 Respiratory Rate(breaths/min): 16 [2:Photos:] Right Gluteus Left Calcaneus Left Gluteus Wound Location: Pressure Injury Pressure Injury Pressure Injury Wounding Event: Pressure Ulcer Pressure Ulcer Pressure Ulcer Primary Etiology: 11/07/2021 02/01/2022 02/26/2022 Date Acquired: '16 6 3 '$ Weeks of Treatment: Open Open Open Wound Status: No No No Wound Recurrence: 0.3x0.2x0.1 1.8x1.5x0.1 0.5x1x0.1 Measurements L x W x D (cm) 0.047 2.121 0.393 A (cm) : rea 0.005 0.212 0.039 Volume (  cm) : 95.00% -439.70% 65.00% % Reduction in A rea: 94.70% -168.40% 65.20% % Reduction in Volume: Category/Stage III Category/Stage III Category/Stage II Classification: Medium None Present Medium Exudate A mount: Serosanguineous N/A Serosanguineous Exudate Type: red, brown N/A red, brown Exudate Color: Small (1-33%) None Present (0%) Medium (34-66%) Granulation A mount: Red, Pink N/A Red, Pink Granulation Quality: Large (67-100%) Large (67-100%) Medium (34-66%) Necrotic A mount: Adherent Slough Eschar, Adherent Slough Eschar, Adherent Slough Necrotic Tissue: Fat Layer (Subcutaneous Tissue): Yes Fat Layer (Subcutaneous Tissue): Yes Fat Layer (Subcutaneous Tissue): Yes Exposed Structures: Fascia: No Fascia: No Fascia: No Tendon: No Tendon: No Tendon: No Muscle: No Muscle: No Muscle: No Joint: No Joint: No Joint: No Bone: No Bone: No Bone: No Small (1-33%) N/A Small (1-33%) Epithelialization: [name], [name] ([id number]) [id number] Page 5 of 11 Treatment Notes Electronic Signature(s) Signed: 04/04/2022 4:38:24 PM By: Rosalio Loud MSN RN CNS WTA Previous Signature: 04/04/2022 10:47:03 AM Version By: Rosalio Loud MSN RN CNS WTA Entered By: Rosalio Loud on 04/04/2022  11:14:44 -------------------------------------------------------------------------------- Multi-Disciplinary Care Plan Details Patient Name: Date of Service: Kevin Custard A. 04/04/2022 10:00 A M Medical Record Number: [id number] Patient Account Number: [id number] Date of Birth/Sex: Treating RN: January 27, 1941 (81 y.o. [name] Primary Care Jaymarion Trombly: Steele Sizer Other Clinician: Referring Amellia Panik: Treating Anayely Constantine/Extender: Osborne Oman Weeks in Treatment: 22 Active Inactive Wound/Skin Impairment Nursing Diagnoses: Knowledge deficit related to ulceration/compromised skin integrity Goals: Patient/caregiver will verbalize understanding of skin care regimen Date Initiated: 10/27/2021 Target Resolution Date: 04/20/2022 Goal Status: Active Ulcer/skin breakdown will have a volume reduction of 30% by week 4 Date Initiated: 10/27/2021 Date Inactivated: 01/19/2022 Target Resolution Date: 11/27/2021 Goal Status: Unmet Unmet Reason: comordbities Ulcer/skin breakdown will have a volume reduction of 50% by week 8 Date Initiated: 10/27/2021 Date Inactivated: 01/19/2022 Target Resolution Date: 12/28/2021 Goal Status: Unmet Unmet Reason: comordbities Ulcer/skin breakdown will have a volume reduction of 80% by week 12 Date Initiated: 10/27/2021 Date Inactivated: 02/16/2022 Target Resolution Date: 01/27/2022 Goal Status: Unmet Unmet Reason: comorbdities Ulcer/skin breakdown will heal within 14 weeks Date Initiated: 10/27/2021 Date Inactivated: 03/21/2022 Target Resolution Date: 02/27/2022 Goal Status: Unmet Unmet Reason: comorbidities Interventions: Assess patient/caregiver ability to obtain necessary supplies Assess patient/caregiver ability to perform ulcer/skin care regimen upon admission and as needed Assess ulceration(s) every visit Notes: Electronic Signature(s) Signed: 04/04/2022 10:48:59 AM By: Rosalio Loud MSN RN CNS WTA Entered By: Rosalio Loud on 04/04/2022  10:48:58 [name] ([id number]) [id number] Page 6 of 11 -------------------------------------------------------------------------------- Pain Assessment Details Patient Name: Date of Service: Kevin Custard A. 04/04/2022 10:00 A M Medical Record Number: [id number] Patient Account Number: [id number] Date of Birth/Sex: Treating RN: [birth year] (81 y.o. [name] Primary Care Laquana Villari: Steele Sizer Other Clinician: Referring Cordarrell Sane: Treating Kamaron Deskins/Extender: Osborne Oman Weeks in Treatment: 22 Active Problems Location of Pain Severity and Description of Pain Patient Has Paino No Site Locations Pain Management and Medication Current Pain Management: Electronic Signature(s) Signed: 04/04/2022 3:14:27 PM By: Rosalio Loud MSN RN CNS WTA Entered By: Rosalio Loud on 04/04/2022 15:14:27 -------------------------------------------------------------------------------- Patient/Caregiver Education Details Patient Name: Date of Service: [name] 12/12/2023andnbsp10:00 [facility] Record Number: [id number] Patient Account Number: [id number] Date of Birth/Gender: Treating RN: [birth year] (81 y.o. [name] Primary Care Physician: Steele Sizer Other Clinician: Referring Physician: Treating Physician/Extender: Osborne Oman Weeks in Treatment: [address] Jamal Collin ([id number]) [id number] Page 7 of 11 Education Assessment Education Provided To: Patient Education Topics  Provided Wound/Skin Impairment: Handouts: Caring for Your Ulcer Methods: Explain/Verbal Responses: State content correctly Electronic Signature(s) Signed: 04/04/2022 4:38:24 PM By: Rosalio Loud MSN RN CNS WTA Entered By: Rosalio Loud on 04/04/2022 10:48:54 -------------------------------------------------------------------------------- Wound Assessment Details Patient Name: Date of Service: Kevin Custard A. 04/04/2022 10:00 A M Medical Record Number: [id number] Patient Account Number: [id number] Date of Birth/Sex: Treating RN: 08/27/1940 (81 y.o. [name] Primary Care Classie Weng: Steele Sizer Other Clinician: Referring Ashir Kunz: Treating Ling Flesch/Extender: Osborne Oman Weeks in Treatment: 22 Wound Status Wound Number: 2 Primary Etiology: Pressure Ulcer Wound Location: Right Gluteus Wound Status: Open Wounding Event: Pressure Injury Date Acquired: 11/07/2021 Weeks Of Treatment: 16 Clustered Wound: No Photos Wound Measurements Length: (cm) 0.3 Width: (cm) 0.2 Depth: (cm) 0.1 Area: (cm) 0.047 Volume: (cm) 0.005 % Reduction in Area: 95% % Reduction in Volume: 94.7% Epithelialization: Small (1-33%) Wound Description Classification: Category/Stage III Exudate Amount: Medium Exudate Type: Serosanguineous [name], [name] ([id number]) Exudate Color: red, brown Foul Odor After Cleansing: No Slough/Fibrino Yes [id number] Page 8 of 11 Wound Bed Granulation Amount: Small (1-33%) Exposed Structure Granulation Quality: Red, Pink Fascia Exposed: No Necrotic Amount: Large (67-100%) Fat Layer (Subcutaneous Tissue) Exposed: Yes Necrotic Quality: Adherent Slough Tendon Exposed: No Muscle Exposed: No Joint Exposed: No Bone Exposed: No Treatment Notes Wound #2 (Gluteus) Wound Laterality: Right Cleanser Peri-Wound Care Topical Primary Dressing Silvercel 4 1/4x 4 1/4 (in/in) Discharge Instruction: Apply Silvercel 4 1/4x 4 1/4 (in/in) as instructed (Border) Zetuvit Plus Silicone Border Dressing 5x5 (in/in) Secondary Dressing Secured With Compression Wrap Compression Stockings Add-Ons Electronic Signature(s) Signed: 04/04/2022 4:38:24 PM By: Rosalio Loud MSN RN CNS WTA Entered By: Rosalio Loud on 04/04/2022 10:42:42 -------------------------------------------------------------------------------- Wound Assessment  Details Patient Name: Date of Service: Kevin Custard A. 04/04/2022 10:00 A M Medical Record Number: [id number] Patient Account Number: [id number] Date of Birth/Sex: Treating RN: [birth year] (81 y.o. [name] Primary Care Hurschel Paynter: Steele Sizer Other Clinician: Referring Alvie Fowles: Treating Zyon Grout/Extender: Osborne Oman Weeks in Treatment: 22 Wound Status Wound Number: 3 Primary Etiology: Pressure Ulcer Wound Location: Left Calcaneus Wound Status: Open Wounding Event: Pressure Injury Date Acquired: 02/01/2022 Weeks Of Treatment: 6 Clustered Wound: No Photos [name], [name] ([id number]) [id number] Page 9 of 11 Wound Measurements Length: (cm) 1.8 Width: (cm) 1.5 Depth: (cm) 0.1 Area: (cm) 2.121 Volume: (cm) 0.212 % Reduction in Area: -439.7% % Reduction in Volume: -168.4% Wound Description Classification: Category/Stage III Exudate Amount: None Present Foul Odor After Cleansing: No Slough/Fibrino Yes Wound Bed Granulation Amount: None Present (0%) Exposed Structure Necrotic Amount: Large (67-100%) Fascia Exposed: No Necrotic Quality: Eschar, Adherent Slough Fat Layer (Subcutaneous Tissue) Exposed: Yes Tendon Exposed: No Muscle Exposed: No Joint Exposed: No Bone Exposed: No Treatment Notes Wound #3 (Calcaneus) Wound Laterality: Left Cleanser Peri-Wound Care Topical Betadine Discharge Instruction: Apply betadine as directed. Primary Dressing Secondary Dressing ABD Pad 5x9 (in/in) Discharge Instruction: Cover with ABD pad Secured With Kerlix Roll Sterile or Non-Sterile 6-ply 4.5x4 (yd/yd) Discharge Instruction: Apply Kerlix as directed Compression Wrap Compression Stockings Add-Ons Electronic Signature(s) Signed: 04/04/2022 4:38:24 PM By: Rosalio Loud MSN RN CNS WTA Entered By: Rosalio Loud on 04/04/2022 10:43:16 [name] ([id number]) [id number] Page 10 of  11 -------------------------------------------------------------------------------- Wound Assessment Details Patient Name: Date of Service: Kevin Custard A. 04/04/2022 10:00 A M Medical Record Number: [id number] Patient Account Number: [id number] Date of Birth/Sex: Treating RN: [birth year] (81 y.o. [name] Primary Care Julieana Eshleman: Steele Sizer Other Clinician: Referring  Aamori Mcmasters: Treating Philopater Mucha/Extender: Osborne Oman Weeks in Treatment: 22 Wound Status Wound Number: 4 Primary Etiology: Pressure Ulcer Wound Location: Left Gluteus Wound Status: Open Wounding Event: Pressure Injury Date Acquired: 02/26/2022 Weeks Of Treatment: 3 Clustered Wound: No Photos Wound Measurements Length: (cm) 0.5 Width: (cm) 1 Depth: (cm) 0.1 Area: (cm) 0.393 Volume: (cm) 0.039 % Reduction in Area: 65% % Reduction in Volume: 65.2% Epithelialization: Small (1-33%) Wound Description Classification: Category/Stage II Exudate Amount: Medium Exudate Type: Serosanguineous Exudate Color: red, brown Foul Odor After Cleansing: No Slough/Fibrino Yes Wound Bed Granulation Amount: Medium (34-66%) Exposed Structure Granulation Quality: Red, Pink Fascia Exposed: No Necrotic Amount: Medium (34-66%) Fat Layer (Subcutaneous Tissue) Exposed: Yes Necrotic Quality: Eschar, Adherent Slough Tendon Exposed: No Muscle Exposed: No Joint Exposed: No Bone Exposed: No Treatment Notes Wound #4 (Gluteus) Wound Laterality: Left Cleanser Peri-Wound Care Topical [name], [name] ([id number]) [id number] Page 11 of 11 Primary Dressing Silvercel 4 1/4x 4 1/4 (in/in) Discharge Instruction: Apply Silvercel 4 1/4x 4 1/4 (in/in) as instructed (Border) Zetuvit Plus Silicone Border Dressing 5x5 (in/in) Secondary Dressing Secured With Compression Wrap Compression Stockings Add-Ons Electronic Signature(s) Signed: 04/04/2022 4:38:24 PM By: Rosalio Loud MSN RN CNS  WTA Entered By: Rosalio Loud on 04/04/2022 10:43:42 -------------------------------------------------------------------------------- [name] Details Patient Name: Date of Service: Kevin Gauze M A. 04/04/2022 10:00 A M Medical Record Number: [id number] Patient Account Number: [id number] Date of Birth/Sex: Treating RN: 11-28-1940 (81 y.o. [name] Primary Care Oneisha Ammons: Steele Sizer Other Clinician: Referring Lynette Noah: Treating Yocheved Depner/Extender: Osborne Oman Weeks in Treatment: 22 Vital Signs Time Taken: 10:26 Temperature (F): 98.2 Height (in): 63 Pulse (bpm): 62 Weight (lbs): 135 Respiratory Rate (breaths/min): 16 Body Mass Index (BMI): 23.9 Blood Pressure (mmHg): 98/78 Reference Range: 80 - 120 mg / dl Electronic Signature(s) Signed: 04/04/2022 3:14:19 PM By: Rosalio Loud MSN RN CNS WTA Entered By: Rosalio Loud on 04/04/2022 15:14:18

## 2022-04-10 ENCOUNTER — Inpatient Hospital Stay (HOSPITAL_BASED_OUTPATIENT_CLINIC_OR_DEPARTMENT_OTHER): Payer: Medicare Other | Admitting: Oncology

## 2022-04-10 ENCOUNTER — Encounter: Payer: Self-pay | Admitting: Oncology

## 2022-04-10 ENCOUNTER — Inpatient Hospital Stay: Payer: Medicare Other | Attending: Oncology

## 2022-04-10 VITALS — BP 93/56 | HR 63 | Temp 96.9°F

## 2022-04-10 DIAGNOSIS — Z79899 Other long term (current) drug therapy: Secondary | ICD-10-CM | POA: Diagnosis not present

## 2022-04-10 DIAGNOSIS — Z87440 Personal history of urinary (tract) infections: Secondary | ICD-10-CM | POA: Insufficient documentation

## 2022-04-10 DIAGNOSIS — I10 Essential (primary) hypertension: Secondary | ICD-10-CM | POA: Insufficient documentation

## 2022-04-10 DIAGNOSIS — Z8546 Personal history of malignant neoplasm of prostate: Secondary | ICD-10-CM | POA: Diagnosis not present

## 2022-04-10 DIAGNOSIS — D649 Anemia, unspecified: Secondary | ICD-10-CM

## 2022-04-10 DIAGNOSIS — D696 Thrombocytopenia, unspecified: Secondary | ICD-10-CM

## 2022-04-10 DIAGNOSIS — F419 Anxiety disorder, unspecified: Secondary | ICD-10-CM | POA: Insufficient documentation

## 2022-04-10 DIAGNOSIS — D72819 Decreased white blood cell count, unspecified: Secondary | ICD-10-CM

## 2022-04-10 LAB — CBC WITH DIFFERENTIAL/PLATELET
Abs Immature Granulocytes: 0.01 10*3/uL (ref 0.00–0.07)
Basophils Absolute: 0 10*3/uL (ref 0.0–0.1)
Basophils Relative: 1 %
Eosinophils Absolute: 0.1 10*3/uL (ref 0.0–0.5)
Eosinophils Relative: 3 %
HCT: 40.2 % (ref 39.0–52.0)
Hemoglobin: 13.9 g/dL (ref 13.0–17.0)
Immature Granulocytes: 0 %
Lymphocytes Relative: 34 %
Lymphs Abs: 1.8 10*3/uL (ref 0.7–4.0)
MCH: 33.3 pg (ref 26.0–34.0)
MCHC: 34.6 g/dL (ref 30.0–36.0)
MCV: 96.2 fL (ref 80.0–100.0)
Monocytes Absolute: 0.3 10*3/uL (ref 0.1–1.0)
Monocytes Relative: 6 %
Neutro Abs: 3.1 10*3/uL (ref 1.7–7.7)
Neutrophils Relative %: 56 %
Platelets: 132 10*3/uL — ABNORMAL LOW (ref 150–400)
RBC: 4.18 MIL/uL — ABNORMAL LOW (ref 4.22–5.81)
RDW: 13.2 % (ref 11.5–15.5)
WBC: 5.4 10*3/uL (ref 4.0–10.5)
nRBC: 0 % (ref 0.0–0.2)

## 2022-04-10 LAB — IRON AND TIBC
Iron: 88 ug/dL (ref 45–182)
Saturation Ratios: 29 % (ref 17.9–39.5)
TIBC: 301 ug/dL (ref 250–450)
UIBC: 213 ug/dL

## 2022-04-10 LAB — VITAMIN B12: Vitamin B-12: 973 pg/mL — ABNORMAL HIGH (ref 180–914)

## 2022-04-10 LAB — FERRITIN: Ferritin: 69 ng/mL (ref 24–336)

## 2022-04-10 NOTE — Progress Notes (Signed)
Hematology/Oncology Consult note [name]  Telephone:([phone] [phone]  Patient Care Team: Steele Sizer, MD as PCP - General (Family Medicine) Alvy Bimler, MD as Consulting Physician (Psychiatry) Abbie Sons, MD (Urology) Schnier, Dolores Lory, MD (Vascular Surgery) Germaine Pomfret, [facility] (Pharmacist) Sindy Guadeloupe, MD as Consulting Physician (Oncology) Lin Landsman, MD as Consulting Physician (Gastroenterology)   Name of the patient: [name]  [id number]  1940-06-08   Date of visit: 04/10/22  Diagnosis-pancytopenia  Chief complaint/ Reason for visit-routine follow-up of pancytopenia  Heme/Onc history: Patient is a 81 year old male was been referred to [place] for leukopenia.  Most recent CBC on 06/05/2018 showed a white count of 3.5, H&H of 12.5/35.9 and a platelet count of 131.  Differential on the CBC was essentially normal.  His past medical history significant for hypertension, cerebral palsy, history of  depression.  He lives in a nursing home and is unable to provide any history. He also sees urology for recurrent UTIs.   Results of blood work from 06/21/2018 were as follows: CBC showed white count of 5.5 with a normal differential and an ANC of 4.0.  H&H 12.8/37.1 and a platelet count of 158.  Hepatitis C testing was negative.  Hepatitis B negative also suggesting patient has not been vaccinated for it so far.  Iron studies were within normal limits.  Haptoglobin was normal.  TSH was normal.  Multiple myeloma panel showed no M protein.  Smear review showed adequate platelets and normal WBC and RBC morphology  Interval history-patient is here with his caregiver today.  He has not had any recent hospitalizations.  No recent infections.  He is at his baseline state of health.  ECOG PS- 4 Pain scale- 0   Review of systems- Review of Systems  Unable to perform ROS: Psychiatric disorder      No Known  Allergies   Past Medical History:  Diagnosis Date   Anxiety    Atrophy, disuse, muscle    BPH (benign prostatic hyperplasia)    Cerebral palsy (HCC)    Eczema    Elevated lipids    Elevated PSA    Exotropia    History of kidney stones    Hyperlipidemia    Kidney stone    Leukopenia    Lower extremity edema    lymphedema   Mental retardation    Over weight    Prostate cancer ([address])    Venous insufficiency    Vitamin D deficiency    Wheelchair dependence      Past Surgical History:  Procedure Laterality Date   COLONOSCOPY WITH PROPOFOL N/A 05/10/2020   Procedure: COLONOSCOPY WITH PROPOFOL;  Surgeon: Lin Landsman, MD;  Location: [facility] ENDOSCOPY;  Service: Gastroenterology;  Laterality: N/A;   COLONOSCOPY WITH PROPOFOL N/A 05/12/2020   Procedure: COLONOSCOPY WITH PROPOFOL;  Surgeon: Lin Landsman, MD;  Location: [facility] ENDOSCOPY;  Service: Gastroenterology;  Laterality: N/A;   CYSTOSCOPY W/ RETROGRADES Left 10/04/2021   Procedure: CYSTOSCOPY WITH RETROGRADE PYELOGRAM;  Surgeon: Abbie Sons, MD;  Location: [facility] ORS;  Service: Urology;  Laterality: Left;   CYSTOSCOPY/URETEROSCOPY/HOLMIUM LASER/STENT PLACEMENT Right 08/14/2017   Procedure: CYSTOSCOPY/URETEROSCOPY/HOLMIUM LASER/STENT PLACEMENT;  Surgeon: Abbie Sons, MD;  Location: [facility] ORS;  Service: Urology;  Laterality: Right;   CYSTOSCOPY/URETEROSCOPY/HOLMIUM LASER/STENT PLACEMENT Left 10/04/2021   Procedure: CYSTOSCOPY/URETEROSCOPY/HOLMIUM LASER/STENT PLACEMENT/ LITHOPAXY;  Surgeon: Abbie Sons, MD;  Location: [facility] ORS;  Service: Urology;  Laterality: Left;   ESOPHAGOGASTRODUODENOSCOPY (EGD)  WITH PROPOFOL N/A 05/12/2020   Procedure: ESOPHAGOGASTRODUODENOSCOPY (EGD) WITH PROPOFOL;  Surgeon: Lin Landsman, MD;  Location: [facility] ENDOSCOPY;  Service: Gastroenterology;  Laterality: N/A;   INCISION AND DRAINAGE ABSCESS Left 07/05/2021   Procedure: INCISION AND DRAINAGE SCROTAL WALL ABSCESS;  Surgeon: Abbie Sons, MD;  Location: [facility] ORS;  Service: Urology;  Laterality: Left;   leg circulation surgery Right    PROSTATE SURGERY  11/10/2008   BRACHYTHERAPY   SCROTAL EXPLORATION Left 07/05/2021   Procedure: SCROTUM EXPLORATION;  Surgeon: Abbie Sons, MD;  Location: [facility] ORS;  Service: Urology;  Laterality: Left;    Social History   Socioeconomic History   Marital status: Single    Spouse name: Not on file   Number of children: Not on file   Years of education: Not on file   Highest education level: Not on file  Occupational History   Not on file  Tobacco Use   Smoking status: Never   Smokeless tobacco: Never  Vaping Use   Vaping Use: Never used  Substance and Sexual Activity   Alcohol use: No    Alcohol/week: 0.0 standard drinks of alcohol   Drug use: No   Sexual activity: Not Currently  Other Topics Concern   Not on file  Social History Narrative   Lives at [facility] group home, 5 males. Moved to higher level of care 04/28/19 with [name].     Social Determinants of Health   Financial Resource Strain: Low Risk  (09/15/2021)   Overall Financial Resource Strain (CARDIA)    Difficulty of Paying Living Expenses: Not hard at all  Food Insecurity: No Food Insecurity (09/15/2021)   Hunger Vital Sign    Worried About Running Out of Food in the Last Year: Never true    Ran Out of Food in the Last Year: Never true  Transportation Needs: No Transportation Needs (09/15/2021)   PRAPARE - Hydrologist (Medical): No    Lack of Transportation (Non-Medical): No  Physical Activity: Inactive (09/15/2021)   Exercise Vital Sign    Days of Exercise per Week: 0 days    Minutes of Exercise per Session: 0 min  Stress: No Stress Concern Present (09/15/2021)   [place]    Feeling of Stress : Not at all  Social Connections: Socially Isolated (09/15/2021)   Social Connection and Isolation Panel  [NHANES]    Frequency of Communication with Friends and Family: Never    Frequency of Social Gatherings with Friends and Family: Never    Attends Religious Services: Never    Marine scientist or Organizations: No    Attends Archivist Meetings: Never    Marital Status: Never married  Intimate Partner Violence: Not At Risk (09/15/2021)   Humiliation, Afraid, Rape, and Kick questionnaire    Fear of Current or Ex-Partner: No    Emotionally Abused: No    Physically Abused: No    Sexually Abused: No    Family History  Problem Relation Age of Onset   Heart attack Brother    Heart disease Other      Current Outpatient Medications:    alfuzosin (UROXATRAL) 10 MG 24 hr tablet, TAKE 1 TABLET BY MOUTH DAILY, Disp: 90 tablet, Rfl: 1   ALPRAZolam (XANAX) 0.25 MG tablet, Take 1 tablet (0.25 mg total) by mouth daily as needed (agitation (crisis med))., Disp: 20 tablet, Rfl: 0   ARTIFICIAL TEARS  0.2-0.2-1 % SOLN, PLACE 2 DROPS INTO EYE 2 TIMES A DAY AS NEEDED, Disp: 15 mL, Rfl: 2   atorvastatin (LIPITOR) 40 MG tablet, TAKE 1 TABLET BY MOUTH EVERY EVENING, Disp: 90 tablet, Rfl: 1   augmented betamethasone dipropionate (DIPROLENE-AF) 0.05 % cream, APPLY TOPICALLY AS DIRECTED TO RASH AS NEEDED, Disp: 45 g, Rfl: 0   Calcium Carb-Cholecalciferol 500-10 MG-MCG TABS, TAKE 1 TABLET BY MOUTH TWICE DAILY., Disp: 180 tablet, Rfl: 0   ciprofloxacin (CIPRO) 250 MG tablet, Take 1 tablet (250 mg total) by mouth 2 (two) times daily., Disp: 14 tablet, Rfl: 0   Cranberry Juice Powder 425 MG CAPS, TAKE 1 CAPSULE BY MOUTH ONCE DAILY, Disp: 90 capsule, Rfl: 1   Elastic Bandages & Supports (MEDICAL COMPRESSION STOCKINGS) MISC, 1 Units by Does not apply route daily., Disp: 2 each, Rfl: 0   GNP VITAMIN C 500 MG tablet, TAKE ONE TABLET BY MOUTH ONCE DAILY, Disp: 100 tablet, Rfl: 1   Incontinence Supply Disposable (DEPEND SILHOUETTE BRIEFS L/XL) MISC, 1 each by Does not apply route 2 (two) times daily as  needed., Disp: 60 each, Rfl: 5   Iron, Ferrous Sulfate, 325 (65 Fe) MG TABS, Take 325 mg by mouth daily., Disp: 30 tablet, Rfl: 3   lubiprostone (AMITIZA) 24 MCG capsule, TAKE 1 CAPSULE BY MOUTH 2 TIMES DAILY WITH A MEAL, Disp: 60 capsule, Rfl: 5   Nutritional Supplements (ENSURE ORIGINAL) LIQD, DRINK 1 BOTTLE BY MOUTH 3 TIMES DAILY, Disp: 5.688 mL, Rfl: 0   nystatin cream (MYCOSTATIN), APPLY 1 APPLICATION TOPICALLY 2 TIMES DAILY, Disp: 30 g, Rfl: 0   Polyethyl Glycol-Propyl Glycol (SYSTANE ULTRA OP), Place 1 drop into both eyes 2 (two) times daily as needed (dry eyes)., Disp: , Rfl:    sertraline (ZOLOFT) 100 MG tablet, TAKE 1 TABLET BY MOUTH DAILY, Disp: 90 tablet, Rfl: 1  Physical exam:  Vitals:   04/10/22 0903  BP: (!) 93/56  Pulse: 63  Temp: (!) 96.9 F (36.1 C)  TempSrc: Tympanic   Physical Exam Constitutional:      Comments: He is in a wheelchair.  Appears in no acute distress.  Chronic contractures of bilateral lower extremities  Cardiovascular:     Rate and Rhythm: Normal rate and regular rhythm.     Heart sounds: Normal heart sounds.  Pulmonary:     Effort: Pulmonary effort is normal.     Breath sounds: Normal breath sounds.  Abdominal:     General: Bowel sounds are normal.     Palpations: Abdomen is soft.  Neurological:     Mental Status: He is alert.         Latest Ref Rng & Units 02/28/2022    3:56 PM  CMP  Glucose 65 - 99 mg/dL 87   BUN 7 - 25 mg/dL 35   Creatinine 0.70 - 1.28 mg/dL 0.64   Sodium 135 - 146 mmol/L 141   Potassium 3.5 - 5.3 mmol/L 4.3   Chloride 98 - 110 mmol/L 106   CO2 20 - 32 mmol/L 28   Calcium 8.6 - 10.3 mg/dL 9.1   Total Protein 6.1 - 8.1 g/dL 6.7   Total Bilirubin 0.2 - 1.2 mg/dL 0.5   AST 10 - 35 U/L 16   ALT 9 - 46 U/L 17       Latest Ref Rng & Units 04/10/2022    8:51 AM  CBC  WBC 4.0 - 10.5 K/uL 5.4   Hemoglobin 13.0 - 17.0 g/dL 13.9  Hematocrit 39.0 - 52.0 % 40.2   Platelets 150 - 400 K/uL 132     No images are  attached to the encounter.  No results found.   Assessment and plan- Patient is a 81 y.o. male here for routine follow-up of pancytopenia   Patient had some mild leukopenia last year but presently his white count was normal with a normal differential.  Platelets mildly low at 132 He has had waxing and waning thrombocytopenia for the last 1 year.  Hemoglobin has actually improved and has been consistently between 12.5-13.5 since early part of this year.  Given that his counts have improved overall he does not require any follow-up with me at this time   Visit Diagnosis 1. Normocytic anemia      Dr. Randa Evens, MD, MPH [facility] [phone] 04/10/2022 1:10 PM

## 2022-04-11 NOTE — Progress Notes (Deleted)
01/05/2022 4:07 PM   [name] 07/16/1940 [id number]  Referring provider: Steele Sizer, MD [address] [place],  [place] [address]  Urological history: 1.  History T1c intermediate risk prostate cancer -Gleason 3+4 diagnosed 2010 -Treated with brachytherapy 11/10/2008 -Last PSA 10/2015 <0.1   2.  Chronic bacteriuria  3.  Stone disease - CT 2019 nonobstructing right mid ureteral calculus -Ureteroscopic stone removal 08/14/2016 -Left URS (10/19/2021)    4.  Renal cysts -CTU 2019 bilateral renal cysts with 2 indeterminate right renal lesions (18 mm interpolar left kidney and 15 mm anterior right kidney) -MRI 07/2017 showed the left renal lesion was not present and was a perfusion anomaly; right renal lesion a Bosniak 2 cyst -CTU 2022 - There is 1.9 cm smooth marginated low-density lesion in the midportion of left kidney. There is 2.2 cm cyst in the anterior midportion of right kidney. There is 2.3 cm exophytic lesion in the anterior midportion of right kidney, possibly cyst  -renal stone study (12/2021) - Bilateral renal cysts measure up to 2.2 cm on the right and 2.4 cm on the left.  HPI: [name] is a 81 y.o. male who presents today for three months follow up.    PMH: Past Medical History:  Diagnosis Date   Anxiety    Atrophy, disuse, muscle    BPH (benign prostatic hyperplasia)    Cerebral palsy (HCC)    Eczema    Elevated lipids    Elevated PSA    Exotropia    History of kidney stones    Hyperlipidemia    Kidney stone    Leukopenia    Lower extremity edema    lymphedema   Mental retardation    Over weight    Prostate cancer ([place])    Venous insufficiency    Vitamin D deficiency    Wheelchair dependence     Surgical History: Past Surgical History:  Procedure Laterality Date   COLONOSCOPY WITH PROPOFOL N/A 05/10/2020   Procedure: COLONOSCOPY WITH PROPOFOL;  Surgeon: Lin Landsman, MD;  Location: [facility] ENDOSCOPY;   Service: Gastroenterology;  Laterality: N/A;   COLONOSCOPY WITH PROPOFOL N/A 05/12/2020   Procedure: COLONOSCOPY WITH PROPOFOL;  Surgeon: Lin Landsman, MD;  Location: [facility] ENDOSCOPY;  Service: Gastroenterology;  Laterality: N/A;   CYSTOSCOPY W/ RETROGRADES Left 10/04/2021   Procedure: CYSTOSCOPY WITH RETROGRADE PYELOGRAM;  Surgeon: Abbie Sons, MD;  Location: [facility] ORS;  Service: Urology;  Laterality: Left;   CYSTOSCOPY/URETEROSCOPY/HOLMIUM LASER/STENT PLACEMENT Right 08/14/2017   Procedure: CYSTOSCOPY/URETEROSCOPY/HOLMIUM LASER/STENT PLACEMENT;  Surgeon: Abbie Sons, MD;  Location: [facility] ORS;  Service: Urology;  Laterality: Right;   CYSTOSCOPY/URETEROSCOPY/HOLMIUM LASER/STENT PLACEMENT Left 10/04/2021   Procedure: CYSTOSCOPY/URETEROSCOPY/HOLMIUM LASER/STENT PLACEMENT/ LITHOPAXY;  Surgeon: Abbie Sons, MD;  Location: [facility] ORS;  Service: Urology;  Laterality: Left;   ESOPHAGOGASTRODUODENOSCOPY (EGD) WITH PROPOFOL N/A 05/12/2020   Procedure: ESOPHAGOGASTRODUODENOSCOPY (EGD) WITH PROPOFOL;  Surgeon: Lin Landsman, MD;  Location: [facility] ENDOSCOPY;  Service: Gastroenterology;  Laterality: N/A;   INCISION AND DRAINAGE ABSCESS Left 07/05/2021   Procedure: INCISION AND DRAINAGE SCROTAL WALL ABSCESS;  Surgeon: Abbie Sons, MD;  Location: [facility] ORS;  Service: Urology;  Laterality: Left;   leg circulation surgery Right    PROSTATE SURGERY  11/10/2008   BRACHYTHERAPY   SCROTAL EXPLORATION Left 07/05/2021   Procedure: SCROTUM EXPLORATION;  Surgeon: Abbie Sons, MD;  Location: [facility] ORS;  Service: Urology;  Laterality: Left;    Home Medications:  Allergies as of  01/05/2022   No Known Allergies      Medication List        Accurate as of January 05, 2022 11:59 PM. If you have any questions, ask your nurse or doctor.          alfuzosin 10 MG 24 hr tablet Commonly known as: UROXATRAL TAKE 1 TABLET BY MOUTH DAILY   ALPRAZolam 0.25 MG tablet Commonly known as:  XANAX Take 1 tablet (0.25 mg total) by mouth daily as needed (agitation (crisis med)).   Artificial Tears 0.2-0.2-1 % Soln Generic drug: Glycerin-Hypromellose-PEG 400 PLACE 2 DROPS INTO EYE 2 TIMES A DAY AS NEEDED   atorvastatin 40 MG tablet Commonly known as: LIPITOR TAKE 1 TABLET BY MOUTH EVERY EVENING   augmented betamethasone dipropionate 0.05 % cream Commonly known as: DIPROLENE-AF APPLY TOPICALLY AS DIRECTED TO RASH AS NEEDED   Calcium Carb-Cholecalciferol 500-10 MG-MCG Tabs TAKE 1 TABLET BY MOUTH 2 TIMES A DAY   ciprofloxacin 250 MG tablet Commonly known as: Cipro Take 1 tablet (250 mg total) by mouth 2 (two) times daily.   Cranberry Juice Powder 425 MG Caps TAKE 1 CAPSULE BY MOUTH ONCE DAILY   Depend Silhouette Briefs L/XL Misc 1 each by Does not apply route 2 (two) times daily as needed.   Ensure Original Liqd Take 1 Can by mouth 3 (three) times daily.   GNP Vitamin C 500 MG tablet Generic drug: ascorbic acid TAKE ONE TABLET BY MOUTH ONCE DAILY   Iron (Ferrous Sulfate) 325 (65 Fe) MG Tabs Take 325 mg by mouth daily.   lubiprostone 24 MCG capsule Commonly known as: AMITIZA TAKE 1 CAPSULE BY MOUTH 2 TIMES DAILY WITH A MEAL   Medical Compression Stockings Misc 1 Units by Does not apply route daily.   nystatin cream Commonly known as: MYCOSTATIN APPLY 1 APPLICATION TOPICALLY 2 TIMES DAILY   sertraline 100 MG tablet Commonly known as: ZOLOFT TAKE 1 TABLET BY MOUTH DAILY   SYSTANE ULTRA OP Place 1 drop into both eyes 2 (two) times daily as needed (dry eyes).        Allergies: No Known Allergies  Family History: Family History  Problem Relation Age of Onset   Heart attack Brother    Heart disease Other     Social History:  reports that he has never smoked. He has never used smokeless tobacco. He reports that he does not drink alcohol and does not use drugs.  ROS: Pertinent ROS in HPI  Physical Exam: BP (!) 112/59   Pulse (!) 53    Constitutional:  Well nourished. Alert and oriented, No acute distress. HEENT: [place] AT, moist mucus membranes.  Trachea midline Cardiovascular: No clubbing, cyanosis, or edema. Respiratory: Normal respiratory effort, no increased work of breathing. GU: No CVA tenderness.  No bladder fullness or masses.  Patient with uncircumcised phallus.  Foreskin easily retracted  Urethral meatus is patent.  No penile discharge. No penile lesions or rashes. Scrotum without lesions, cysts, rashes and/or edema.   Neurologic: Grossly intact, no focal deficits, moving all 4 extremities. Psychiatric: Normal mood and affect.   Laboratory Data: Component     Latest Ref Rng 01/05/2022  Color, UA     Yellow  Yellow   Bilirubin, UA     Negative  Negative   Ketones, UA     Negative  Negative   Specific Gravity, UA     1.005 - 1.030  1.020   RBC, UA     Negative  2+ !  pH, UA     5.0 - 7.5  5.5   Protein,UA     Negative/Trace  2+ !   Nitrite, UA     Negative  Positive !   Leukocytes,UA     Negative  1+ !   Glucose, UA     Negative  Negative   Appearance Ur     Clear  Cloudy !   Urobilinogen, Ur     0.2 - 1.0 mg/dL 1.0   Microscopic Examination See below:     Legend: ! Abnormal  Component     Latest Ref Rng 01/05/2022  WBC, UA     0 - 5 /hpf >30 !   Bacteria, UA     None seen/Few  Many !   Epithelial Cells (non renal)     0 - 10 /hpf 0-10   RBC, Urine     0 - 2 /hpf >30 !     Legend: ! Abnormal I have reviewed the labs.   Procedure In and Out Catheterization Patient was cleaned and prepped in a sterile fashion with betadine . A 14 FR cath was inserted no complications were noted , 70 ml of urine return was noted, urine was yellow cloudy in color. A clean urine sample was collected for urinalysis and urine culture. Bladder was drained  And catheter was removed with out difficulty.    Performed by: Zara Council, PA-C and Evelina Bucy, CMA and Kyra Manges, CMA  Pertinent  Imaging: KUB (01/05/2022) large right renal pelvic stone I have independently reviewed the films.  See HPI.  Radiologist interpretation still pending  Assessment & Plan:    1. Right UPJ stone -due to patient's contractures, ureteroscopy was found to be challenging when addressing his left proximal stone in June -CT renal stone study ordered to assist w/ defining stone burden and distribution, and to evaluate collecting system anatomy, position of peri-renal structures and relevant anatomic variants -he will return for report w/ Dr. Erlene Quan for further discussion regarding management and treatment of his stones -CATH UA grossly infected -urine culture -started on Cipro 250 mg BID -reviewed red flag signs and advised to seek treatment in the ED or call office immediately  -These notes generated with voice recognition software. I apologize for typographical errors.  [place], [place] [address]  [place],  [address] [phone]

## 2022-04-12 ENCOUNTER — Ambulatory Visit: Payer: Medicare Other | Admitting: Urology

## 2022-04-25 ENCOUNTER — Encounter: Payer: 59 | Attending: Physician Assistant | Admitting: Physician Assistant

## 2022-04-25 DIAGNOSIS — I87393 Chronic venous hypertension (idiopathic) with other complications of bilateral lower extremity: Secondary | ICD-10-CM | POA: Insufficient documentation

## 2022-04-25 DIAGNOSIS — L89623 Pressure ulcer of left heel, stage 3: Secondary | ICD-10-CM | POA: Diagnosis not present

## 2022-04-25 DIAGNOSIS — G809 Cerebral palsy, unspecified: Secondary | ICD-10-CM | POA: Insufficient documentation

## 2022-04-25 DIAGNOSIS — I872 Venous insufficiency (chronic) (peripheral): Secondary | ICD-10-CM | POA: Insufficient documentation

## 2022-04-25 DIAGNOSIS — I739 Peripheral vascular disease, unspecified: Secondary | ICD-10-CM | POA: Insufficient documentation

## 2022-04-25 DIAGNOSIS — L89310 Pressure ulcer of right buttock, unstageable: Secondary | ICD-10-CM | POA: Diagnosis not present

## 2022-04-25 DIAGNOSIS — L89320 Pressure ulcer of left buttock, unstageable: Secondary | ICD-10-CM | POA: Diagnosis not present

## 2022-04-26 ENCOUNTER — Ambulatory Visit: Admission: RE | Admit: 2022-04-26 | Payer: 59 | Source: Ambulatory Visit

## 2022-04-26 NOTE — Progress Notes (Addendum)
GADGE, HERMIZ ([id number]) [id number] Page 1 of 11 Visit Report for 04/25/2022 Arrival Information Details Patient Name: Date of Service: [name] A. 04/25/2022 9:45 A M Medical Record Number: [id number] Patient Account Number: [id number] Date of Birth/Sex: Treating RN: 28-Dec-1940 (81 y.o. [name]) Dolores Lory, Morey Hummingbird Primary Care Jasmon Mattice: Steele Sizer Other Clinician: Referring Tanis Hensarling: Treating Jhoanna Heyde/Extender: Osborne Oman Weeks in Treatment: 25 Visit Information History Since Last Visit Added or deleted any medications: No Patient Arrived: Wheel Chair Any new allergies or adverse reactions: No Arrival Time: 09:48 Had a fall or experienced change in No Accompanied By: caregiver activities of daily living that may affect Transfer Assistance: None risk of falls: Patient Identification Verified: Yes Signs or symptoms of abuse/neglect since last visito No Secondary Verification Process Completed: Yes Hospitalized since last visit: No Patient Requires Transmission-Based Precautions: No Implantable device outside of the clinic excluding No Patient Has Alerts: No cellular tissue based products placed in the center since last visit: Has Dressing in Place as Prescribed: Yes Pain Present Now: No Electronic Signature(s) Signed: 04/26/2022 9:24:28 AM By: Carlene Coria RN Entered By: Carlene Coria on 04/25/2022 09:57:36 -------------------------------------------------------------------------------- Clinic Level of Care Assessment Details Patient Name: Date of Service: [name] 04/25/2022 9:45 A M Medical Record Number: [id number] Patient Account Number: [id number] Date of Birth/Sex: Treating RN: 1941/01/31 (81 y.o. [name]) Carlene Coria Primary Care Karlisa Gaubert: Steele Sizer Other Clinician: Referring Shelah Heatley: Treating Tanita Palinkas/Extender: Osborne Oman Weeks in Treatment: 25 Clinic Level of Care Assessment Items TOOL 4  Quantity Score X- 1 0 Use when only an EandM is performed on FOLLOW-UP visit ASSESSMENTS - Nursing Assessment / Reassessment X- 1 10 Reassessment of Co-morbidities (includes updates in patient status) X- 1 5 Reassessment of Adherence to Treatment Plan PERICLES, CARMICHEAL ([id number]) [id number] Page 2 of 11 ASSESSMENTS - Wound and Skin A ssessment / Reassessment '[]'$  - Simple Wound Assessment / Reassessment - one wound 0 X- 3 5 Complex Wound Assessment / Reassessment - multiple wounds '[]'$  - 0 Dermatologic / Skin Assessment (not related to wound area) ASSESSMENTS - Focused Assessment '[]'$  - 0 Circumferential Edema Measurements - multi extremities '[]'$  - 0 Nutritional Assessment / Counseling / Intervention '[]'$  - 0 Lower Extremity Assessment (monofilament, tuning fork, pulses) '[]'$  - 0 Peripheral Arterial Disease Assessment (using hand held doppler) ASSESSMENTS - Ostomy and/or Continence Assessment and Care '[]'$  - 0 Incontinence Assessment and Management '[]'$  - 0 Ostomy Care Assessment and Management (repouching, etc.) PROCESS - Coordination of Care X - Simple Patient / Family Education for ongoing care 1 15 '[]'$  - 0 Complex (extensive) Patient / Family Education for ongoing care '[]'$  - 0 Staff obtains Programmer, systems, Records, T Results / Process Orders est '[]'$  - 0 Staff telephones HHA, Nursing Homes / Clarify orders / etc '[]'$  - 0 Routine Transfer to another Facility (non-emergent condition) '[]'$  - 0 Routine Hospital Admission (non-emergent condition) '[]'$  - 0 New Admissions / Biomedical engineer / Ordering NPWT Apligraf, etc. , '[]'$  - 0 Emergency Hospital Admission (emergent condition) '[]'$  - 0 Simple Discharge Coordination '[]'$  - 0 Complex (extensive) Discharge Coordination PROCESS - Special Needs '[]'$  - 0 Pediatric / Minor Patient Management '[]'$  - 0 Isolation Patient Management '[]'$  - 0 Hearing / Language / Visual special needs '[]'$  - 0 Assessment of Community assistance  (transportation, D/C planning, etc.) '[]'$  - 0 Additional assistance / Altered mentation '[]'$  - 0 Support Surface(s) Assessment (bed, cushion, seat, etc.) INTERVENTIONS - Wound Cleansing / Measurement '[]'$  -  0 Simple Wound Cleansing - one wound X- 3 5 Complex Wound Cleansing - multiple wounds '[]'$  - 0 Wound Imaging (photographs - any number of wounds) '[]'$  - 0 Wound Tracing (instead of photographs) '[]'$  - 0 Simple Wound Measurement - one wound X- 3 5 Complex Wound Measurement - multiple wounds INTERVENTIONS - Wound Dressings X - Small Wound Dressing one or multiple wounds 1 10 '[]'$  - 0 Medium Wound Dressing one or multiple wounds '[]'$  - 0 Large Wound Dressing one or multiple wounds '[]'$  - 0 Application of Medications - topical '[]'$  - 0 Application of Medications - injection INTERVENTIONS - Miscellaneous '[]'$  - 0 External ear exam SRIHAN, BRUTUS ([id number]) [id number] Page 3 of 11 '[]'$  - 0 Specimen Collection (cultures, biopsies, blood, body fluids, etc.) '[]'$  - 0 Specimen(s) / Culture(s) sent or taken to Lab for analysis '[]'$  - 0 Patient Transfer (multiple staff / Harrel Lemon Lift / Similar devices) '[]'$  - 0 Simple Staple / Suture removal (25 or less) '[]'$  - 0 Complex Staple / Suture removal (26 or more) '[]'$  - 0 Hypo / Hyperglycemic Management (close monitor of Blood Glucose) '[]'$  - 0 Ankle / Brachial Index (ABI) - do not check if billed separately X- 1 5 Vital Signs Has the patient been seen at the hospital within the last three years: Yes Total Score: 90 Level Of Care: New/Established - Level 3 Electronic Signature(s) Signed: 04/26/2022 9:24:28 AM By: Carlene Coria RN Entered By: Carlene Coria on 04/25/2022 10:21:19 -------------------------------------------------------------------------------- Complex / Palliative Patient Assessment Details Patient Name: Date of Service: [name] A. 04/25/2022 9:45 A M Medical Record Number: [id number] Patient Account Number:  [id number] Date of Birth/Sex: Treating RN: [birth year] (82 y.o. Verl Blalock Primary Care Ylonda Storr: Steele Sizer Other Clinician: Referring Javarion Douty: Treating Boysie Bonebrake/Extender: Osborne Oman Weeks in Treatment: 25 Complex Wound Management Criteria Patient has remarkable or complex co-morbidities requiring medications or treatments that extend wound healing times. Examples: Diabetes mellitus with chronic renal failure or end stage renal disease requiring dialysis Advanced or poorly controlled rheumatoid arthritis Diabetes mellitus and end stage chronic obstructive pulmonary disease Active cancer with current chemo- or radiation therapy CP, wheelchair dependent Palliative Wound Management Criteria Care Approach Wound Care Plan: Complex Wound Management Electronic Signature(s) Signed: 05/01/2022 3:50:08 PM By: Gretta Cool, BSN, RN, CWS, Kim RN, BSN Signed: 05/01/2022 4:37:51 PM By: Worthy Keeler PA-C Entered By: Gretta Cool, BSN, RN, CWS, Kim on 05/01/2022 15:50:07 [name] ([id number]) [id number] Page 4 of 11 -------------------------------------------------------------------------------- Encounter Discharge Information Details Patient Name: Date of Service: [name] 04/25/2022 9:45 A M Medical Record Number: [id number] Patient Account Number: [id number] Date of Birth/Sex: Treating RN: 24-Jan-1941 (81 y.o. [name]) Carlene Coria Primary Care Evalyse Stroope: Steele Sizer Other Clinician: Referring Arval Brandstetter: Treating Jalonda Antigua/Extender: Osborne Oman Weeks in Treatment: 25 Encounter Discharge Information Items Discharge Condition: Stable Ambulatory Status: Wheelchair Discharge Destination: Home Transportation: Private Auto Accompanied By: caregiver Schedule Follow-up Appointment: Yes Clinical Summary of Care: Electronic Signature(s) Signed: 04/26/2022 9:24:28 AM By: Carlene Coria RN Entered By: Carlene Coria on 04/25/2022  10:22:06 -------------------------------------------------------------------------------- Lower Extremity Assessment Details Patient Name: Date of Service: [name] 04/25/2022 9:45 A M Medical Record Number: [id number] Patient Account Number: [id number] Date of Birth/Sex: Treating RN: Sep 07, 1940 (81 y.o. [name] Primary Care Jermane Brayboy: Steele Sizer Other Clinician: Referring Raedyn Klinck: Treating Karilynn Carranza/Extender: Osborne Oman Weeks in Treatment: 25 Electronic Signature(s) Signed: 04/25/2022 10:05:46 AM By: Carlene Coria RN Previous Signature: 04/25/2022 10:02:06  AM Version By: Carlene Coria RN Entered By: Carlene Coria on 04/25/2022 10:05:46 [name] ([id number]) [id number] Page 5 of 11 -------------------------------------------------------------------------------- Multi Wound Chart Details Patient Name: Date of Service: [name] 04/25/2022 9:45 A M Medical Record Number: [id number] Patient Account Number: [id number] Date of Birth/Sex: Treating RN: Feb 08, 1941 (81 y.o. [name]) Carlene Coria Primary Care Myeasha Ballowe: Steele Sizer Other Clinician: Referring Nakoa Ganus: Treating Richar Dunklee/Extender: Osborne Oman Weeks in Treatment: 25 Vital Signs Height(in): 63 Pulse(bpm): 68 Weight(lbs): 135 Blood Pressure(mmHg): 96/78 Body Mass Index(BMI): 23.9 Temperature(F): 98.1 Respiratory Rate(breaths/min): 18 [2:Photos: No Photos Right Gluteus Wound Location: Pressure Injury Wounding Event: Pressure Ulcer Primary Etiology: 11/07/2021 Date Acquired: 19 Weeks of Treatment: Open Wound Status: No Wound Recurrence: 0x0x0 Measurements L x W x D (cm) 0 A (cm) : rea 0 Volume  (cm) : 100.00% % Reduction in A rea: 100.00% % Reduction in Volume: Category/Stage III Classification: None Present Exudate A mount: None Present (0%) Granulation A mount: None Present (0%) Necrotic A mount: N/A Necrotic Tissue: Fascia: No Exposed   Structures: Fat Layer (Subcutaneous Tissue): No Tendon: No Muscle: No Joint: No Bone: No Large (67-100%) Epithelialization:] [3:No Photos Left Calcaneus Pressure Injury Pressure Ulcer 02/01/2022 9 Open No 0.1x0.1x0.1 0.008 0.001 98.00% 98.70%  Category/Stage III None Present None Present (0%) Large (67-100%) Eschar, Adherent Slough Fascia: No Fat Layer (Subcutaneous Tissue): No Tendon: No Muscle: No Joint: No Bone: No N/A] [4:No Photos Left Gluteus Pressure Injury Pressure Ulcer 02/26/2022 6  Open No 0x0x0 0 0 100.00% 100.00% Category/Stage II None Present None Present (0%) None Present (0%) N/A Fascia: No Fat Layer (Subcutaneous Tissue): No Tendon: No Muscle: No Joint: No Bone: No Large (67-100%)] Treatment Notes Electronic Signature(s) Signed: 04/25/2022 10:05:55 AM By: Carlene Coria RN Previous Signature: 04/25/2022 10:01:33 AM Version By: Carlene Coria RN Entered By: Carlene Coria on 04/25/2022 10:05:54 [name] ([id number]) [id number] Page 6 of 11 -------------------------------------------------------------------------------- Multi-Disciplinary Care Plan Details Patient Name: Date of Service: [name] A. 04/25/2022 9:45 A M Medical Record Number: [id number] Patient Account Number: [id number] Date of Birth/Sex: Treating RN: 04/16/1941 (81 y.o. [name]) Carlene Coria Primary Care Claryssa Sandner: Steele Sizer Other Clinician: Referring Signa Cheek: Treating Anwar Crill/Extender: Osborne Oman Weeks in Treatment: 25 Active Inactive Wound/Skin Impairment Nursing Diagnoses: Knowledge deficit related to ulceration/compromised skin integrity Goals: Patient/caregiver will verbalize understanding of skin care regimen Date Initiated: 10/27/2021 Target Resolution Date: 05/21/2022 Goal Status: Active Ulcer/skin breakdown will have a volume reduction of 30% by week 4 Date Initiated: 10/27/2021 Date Inactivated: 01/19/2022 Target Resolution Date: 11/27/2021 Goal  Status: Unmet Unmet Reason: comordbities Ulcer/skin breakdown will have a volume reduction of 50% by week 8 Date Initiated: 10/27/2021 Date Inactivated: 01/19/2022 Target Resolution Date: 12/28/2021 Goal Status: Unmet Unmet Reason: comordbities Ulcer/skin breakdown will have a volume reduction of 80% by week 12 Date Initiated: 10/27/2021 Date Inactivated: 02/16/2022 Target Resolution Date: 01/27/2022 Goal Status: Unmet Unmet Reason: comorbdities Ulcer/skin breakdown will heal within 14 weeks Date Initiated: 10/27/2021 Date Inactivated: 03/21/2022 Target Resolution Date: 02/27/2022 Goal Status: Unmet Unmet Reason: comorbidities Interventions: Assess patient/caregiver ability to obtain necessary supplies Assess patient/caregiver ability to perform ulcer/skin care regimen upon admission and as needed Assess ulceration(s) every visit Notes: Electronic Signature(s) Signed: 04/25/2022 10:06:25 AM By: Carlene Coria RN Entered By: Carlene Coria on 04/25/2022 10:06:24 -------------------------------------------------------------------------------- Pain Assessment Details Patient Name: Date of Service: [name] A. 04/25/2022 9:45 A M Medical Record Number: [id number] Patient Account Number: [id number] Date of Birth/Sex:  Treating RN: 1940-11-04 (81 y.o. [name] Primary Care Vicki Chaffin: Steele Sizer Other Clinician: Referring Delaine Canter: Treating Axel Meas/Extender: Osborne Oman Weeks in Treatment: 25 Active Problems Location of Pain Severity and Description of Pain Patient Has Paino No Site Locations DEMETRIE, BORGE A ([id number]) [id number] Page 7 of 11 Pain Management and Medication Current Pain Management: Electronic Signature(s) Signed: 04/26/2022 9:24:28 AM By: Carlene Coria RN Entered By: Carlene Coria on 04/25/2022 09:58:18 -------------------------------------------------------------------------------- Patient/Caregiver Education  Details Patient Name: Date of Service: [name] 1/2/2024andnbsp9:45 [place] Record Number: [id number] Patient Account Number: [id number] Date of Birth/Gender: Treating RN: [birth year] (81 y.o. [name] Primary Care Physician: Steele Sizer Other Clinician: Referring Physician: Treating Physician/Extender: Rogelio Seen in Treatment: 25 Education Assessment Education Provided To: Patient Education Topics Provided Wound/Skin Impairment: Methods: Explain/Verbal Responses: State content correctly [employer]) Signed: 04/26/2022 9:24:28 AM By: Carlene Coria RN Entered By: Carlene Coria on 04/25/2022 10:06:09 [name] ([id number]) [id number] Page 8 of 11 -------------------------------------------------------------------------------- Wound Assessment Details Patient Name: Date of Service: [name] 04/25/2022 9:45 A M Medical Record Number: [id number] Patient Account Number: [id number] Date of Birth/Sex: Treating RN: February 02, 1941 (81 y.o. [name]) Carlene Coria Primary Care Taesha Goodell: Steele Sizer Other Clinician: Referring Jasia Hiltunen: Treating Carlton Sweaney/Extender: Osborne Oman Weeks in Treatment: 25 Wound Status Wound Number: 2 Primary Etiology: Pressure Ulcer Wound Location: Right Gluteus Wound Status: Open Wounding Event: Pressure Injury Date Acquired: 11/07/2021 Weeks Of Treatment: 19 Clustered Wound: No Wound Measurements Length: (cm) Width: (cm) Depth: (cm) Area: (cm) Volume: (cm) 0 % Reduction in Area: 100% 0 % Reduction in Volume: 100% 0 Epithelialization: Large (67-100%) 0 Tunneling: No 0 Undermining: No Wound Description Classification: Category/Stage III Exudate Amount: None Present Foul Odor After Cleansing: No Slough/Fibrino No Wound Bed Granulation Amount: None Present (0%) Exposed Structure Necrotic Amount: None Present (0%) Fascia Exposed: No Fat  Layer (Subcutaneous Tissue) Exposed: No Tendon Exposed: No Muscle Exposed: No Joint Exposed: No Bone Exposed: No Electronic Signature(s) Signed: 04/25/2022 10:04:05 AM By: Carlene Coria RN Entered By: Carlene Coria on 04/25/2022 10:04:05 -------------------------------------------------------------------------------- Wound Assessment Details Patient Name: Date of Service: [name] A. 04/25/2022 9:45 A M Medical Record Number: [id number] Patient Account Number: [id number] Date of Birth/Sex: Treating RN: [birth year] (81 y.o. [name] Primary Care Aqib Lough: Steele Sizer Other Clinician: [name] ([id number]) [id number] Page 9 of 11 Referring Delois Tolbert: Treating Zackry Deines/Extender: Osborne Oman Weeks in Treatment: 25 Wound Status Wound Number: 3 Primary Etiology: Pressure Ulcer Wound Location: Left Calcaneus Wound Status: Open Wounding Event: Pressure Injury Date Acquired: 02/01/2022 Weeks Of Treatment: 9 Clustered Wound: No Wound Measurements Length: (cm) 0.1 Width: (cm) 0.1 Depth: (cm) 0.1 Area: (cm) 0.008 Volume: (cm) 0.001 % Reduction in Area: 98% % Reduction in Volume: 98.7% Tunneling: No Undermining: No Wound Description Classification: Category/Stage III Exudate Amount: None Present Foul Odor After Cleansing: No Slough/Fibrino Yes Wound Bed Granulation Amount: None Present (0%) Exposed Structure Necrotic Amount: Large (67-100%) Fascia Exposed: No Necrotic Quality: Eschar, Adherent Slough Fat Layer (Subcutaneous Tissue) Exposed: No Tendon Exposed: No Muscle Exposed: No Joint Exposed: No Bone Exposed: No Treatment Notes Wound #3 (Calcaneus) Wound Laterality: Left Cleanser Peri-Wound Care Topical Betadine Discharge Instruction: Apply betadine as directed. Primary Dressing Secondary Dressing ABD Pad 5x9 (in/in) Discharge Instruction: Cover with ABD pad Secured With Kerlix Roll Sterile or  Non-Sterile 6-ply 4.5x4 (yd/yd) Discharge Instruction: Apply Kerlix as directed Compression Wrap Compression Stockings Add-Ons  Electronic Signature(s) Signed: 04/25/2022 10:05:21 AM By: Carlene Coria RN Entered By: Carlene Coria on 04/25/2022 10:05:21 [name] ([id number]) [id number] Page 10 of 11 -------------------------------------------------------------------------------- Wound Assessment Details Patient Name: Date of Service: [name] A. 04/25/2022 9:45 A M Medical Record Number: [id number] Patient Account Number: [id number] Date of Birth/Sex: Treating RN: Jul 22, 1940 (81 y.o. [name]) Carlene Coria Primary Care Odie Edmonds: Steele Sizer Other Clinician: Referring Asuncion Shibata: Treating Jolette Lana/Extender: Osborne Oman Weeks in Treatment: 25 Wound Status Wound Number: 4 Primary Etiology: Pressure Ulcer Wound Location: Left Gluteus Wound Status: Open Wounding Event: Pressure Injury Date Acquired: 02/26/2022 Weeks Of Treatment: 6 Clustered Wound: No Wound Measurements Length: (cm) Width: (cm) Depth: (cm) Area: (cm) Volume: (cm) 0 % Reduction in Area: 100% 0 % Reduction in Volume: 100% 0 Epithelialization: Large (67-100%) 0 Tunneling: No 0 Undermining: No Wound Description Classification: Category/Stage II Exudate Amount: None Present Foul Odor After Cleansing: No Slough/Fibrino No Wound Bed Granulation Amount: None Present (0%) Exposed Structure Necrotic Amount: None Present (0%) Fascia Exposed: No Fat Layer (Subcutaneous Tissue) Exposed: No Tendon Exposed: No Muscle Exposed: No Joint Exposed: No Bone Exposed: No Electronic Signature(s) Signed: 04/25/2022 10:04:31 AM By: Carlene Coria RN Entered By: Carlene Coria on 04/25/2022 10:04:31 -------------------------------------------------------------------------------- [place] Details Patient Name: Date of Service: [name] 04/25/2022 9:45 A M Medical Record  Number: [id number] Patient Account Number: [id number] Date of Birth/Sex: Treating RN: [birth year] (81 y.o. [name] Primary Care Stefan Markarian: Steele Sizer Other Clinician: [name] ([id number]) [id number] Page 11 of 11 Referring Brace Welte: Treating Yavuz Kirby/Extender: Osborne Oman Weeks in Treatment: 25 Vital Signs Time Taken: 09:57 Temperature (F): 98.1 Height (in): 63 Pulse (bpm): 68 Weight (lbs): 135 Respiratory Rate (breaths/min): 18 Body Mass Index (BMI): 23.9 Blood Pressure (mmHg): 96/78 Reference Range: 80 - 120 mg / dl Electronic Signature(s) Signed: 04/26/2022 9:24:28 AM By: Carlene Coria RN Entered By: Carlene Coria on 04/25/2022 09:58:10

## 2022-04-26 NOTE — Progress Notes (Signed)
Kevin, Macias ([id number]) [id number] Page 1 of 7 Visit Report for 04/25/2022 Chief Complaint Document Details Patient Name: Date of Service: Kevin Custard A. 04/25/2022 9:45 A M Medical Record Number: [id number] Patient Account Number: [id number] Date of Birth/Sex: Treating RN: 02-Dec-1940 (82 y.o. [name]) Carlene Coria Primary Care Provider: Steele Sizer Other Clinician: Referring Provider: Treating Provider/Extender: Osborne Oman Weeks in Treatment: 25 Information Obtained from: Patient Chief Complaint Left heel pressure ulcer and right buttock pressure ulcer Electronic Signature(s) Signed: 04/25/2022 10:15:52 AM By: Worthy Keeler PA-C Entered By: Worthy Keeler on 04/25/2022 10:15:52 -------------------------------------------------------------------------------- HPI Details Patient Name: Date of Service: Kevin Custard A. 04/25/2022 9:45 A M Medical Record Number: [id number] Patient Account Number: [id number] Date of Birth/Sex: Treating RN: 03/09/1941 (82 y.o. [name] Primary Care Provider: Steele Sizer Other Clinician: Referring Provider: Treating Provider/Extender: Osborne Oman Weeks in Treatment: 25 History of Present Illness HPI Description: 10-27-2021 upon evaluation today patient presents for initial evaluation here in the clinic concerning an issue that has been present since around June 1. Subsequently the patient's caregiver from [name] tells me that he has been treated mainly by herself she has been doing what she can to try to take care of this using DuoDERM over the area. There is a lot of skin that is actually loosen up over the top of the heel surface but not all of this is necessarily covering wound in fact there is a majority of this that is not wound related as far as the skin over top is concerned. Fortunately I do not see any signs of active infection locally or systemically at this  time which is great news. He does have some slough over the surface of the wound but mainly the skin around the edges the thing I am most concerned with. It is trapping a lot of fluid to be honest. Patient does have a history of chronic venous insufficiency, cerebral palsy, and stated peripheral vascular disease. With that being said I do not see any evidence right now of active infection which is great news. No fevers, chills, nausea, vomiting, or diarrhea. 11-10-2021 upon evaluation today patient appears to be doing well currently in regard to his wound on the heel. Fortunately there does not appear to be any signs of active infection locally or systemically at this time which is great news. Fortunately I do not see any evidence of infection which is also excellent. No fevers, chills, nausea, vomiting, or diarrhea. 12-08-2021 upon evaluation today patient appears to be doing well currently in regard to his heel although unfortunately has a right gluteal pressure ulcer. Fortunately there does not appear to be any signs of active infection locally or systemically which is great news and overall I am extremely pleased with where we stand today. Kevin, Macias ([id number]) [id number] Page 2 of 7 12-29-2021 upon evaluation patient's heel as well as gluteal area both appear to be doing better. There does not appear to be any signs of active infection locally or systemically at this time which is great news. No fevers, chills, nausea, vomiting, or diarrhea. 01-19-2022 upon evaluation today patient appears to be doing well with regard to his wound. He has been tolerating the dressing changes without complication. Fortunately there does not appear to be any evidence of active infection locally or systemically at this time which is great news. No fevers, chills, nausea, vomiting, or diarrhea. 02-16-2022 upon evaluation today patient appears to be  doing well currently in regard to  his wounds in general everything is healed although he has a new wound on the left heel which is something that was not present previous. Again this is a little disconcerting especially considering the other wounds have completely closed and we were hoping he be ready for discharge today. Again I do believe that this is something that looks like a pressure injury stage III. 03-14-2022 upon evaluation today patient appears to be doing poorly in regard to both his heels as well as his gluteal region. Unfortunately this has reopened now as a result of excess pressure and he does not seem to be offloaded as well as it should be. Unfortunately I do think that this is due to lack of turning and repositioning. There is nothing else that would account for the bruising that we are seeing at this point in regards to the deep tissue injury. I discussed that with the patient's caregiver with him today. I do think, to put in a call to the facility as well at [name] where he is currently residing and Gena Fray is the nurse there. Her phone number is [phone]. 03-21-2022 upon evaluation today patient's wounds are actually significantly better compared to last week's evaluation. Fortunately there does not appear to be any signs of infection locally or systemically which is great news and overall I am extremely pleased with where things stand currently. No fevers, chills, nausea, vomiting, or diarrhea. 04-04-2022 upon evaluation today patient appears to be doing well currently based on what I am seeing in regard to his heel wound this is pretty dry and I think it is probably pretty much healed. Fortunately I do not see any evidence of active infection locally nor systemically at this time. In regard to the gluteal wound this is showing signs of excellent improvement. 04-25-2022 upon evaluation patient's wound bed actually showed signs of doing well his gluteal wounds are healed and the heel might actually be  healed although I want to monitor this a little bit longer before I write this off completely. Electronic Signature(s) Signed: 04/25/2022 5:03:41 PM By: Worthy Keeler PA-C Entered By: Worthy Keeler on 04/25/2022 17:03:41 -------------------------------------------------------------------------------- Physical Exam Details Patient Name: Date of Service: [name] 04/25/2022 9:45 A M Medical Record Number: [id number] Patient Account Number: [id number] Date of Birth/Sex: Treating RN: [birth year] (82 y.o. [name] Primary Care Provider: Steele Sizer Other Clinician: Referring Provider: Treating Provider/Extender: Osborne Oman Weeks in Treatment: 58 Constitutional Well-nourished and well-hydrated in no acute distress. Respiratory normal breathing without difficulty. Psychiatric this patient is able to make decisions and demonstrates good insight into disease process. Alert and Oriented x 3. pleasant and cooperative. Notes Patient's wounds in the gluteal area again are completely closed and on the heel he has just what appears to be some discoloration with the majority of this having flaked off today I am not even certain there is an actual opening though I did leave it at 0.1 to monitor for the next couple of weeks just to be certain. Electronic Signature(s) Signed: 04/25/2022 5:03:59 PM By: Worthy Keeler PA-C Entered By: Worthy Keeler on 04/25/2022 17:03:59 [name] ([id number]) [id number] Page 3 of 7 -------------------------------------------------------------------------------- Physician Orders Details Patient Name: Date of Service: [name] 04/25/2022 9:45 A M Medical Record Number: [id number] Patient Account Number: [id number] Date of Birth/Sex: Treating RN: [birth year] (82 y.o. [name]) Carlene Coria Primary Care Provider: Steele Sizer  Other Clinician: Referring Provider: Treating Provider/Extender:  Rogelio Seen in Treatment: 25 Verbal / Phone Orders: No Diagnosis Coding Follow-up Appointments Return Appointment in 1 week. Edema Control - Lymphedema / Segmental Compressive Device / Other Elevate, Exercise Daily and A void Standing for Long Periods of Time. Elevate legs to the level of the heart and pump ankles as often as possible Elevate leg(s) parallel to the floor when sitting. Off-Loading Heel suspension boot Turn and reposition every 2 hours Wound Treatment Wound #3 - Calcaneus Wound Laterality: Left Topical: Betadine 1 x Per Day/30 Days Discharge Instructions: Apply betadine as directed. Secondary Dressing: ABD Pad 5x9 (in/in) 1 x Per Day/30 Days Discharge Instructions: Cover with ABD pad Secured With: Kerlix Roll Sterile or Non-Sterile 6-ply 4.5x4 (yd/yd) 1 x Per Day/30 Days Discharge Instructions: Apply Kerlix as directed Electronic Signature(s) Signed: 04/25/2022 10:08:03 AM By: Carlene Coria RN Signed: 04/25/2022 5:11:32 PM By: Worthy Keeler PA-C Entered By: Carlene Coria on 04/25/2022 10:08:03 -------------------------------------------------------------------------------- Problem List Details Patient Name: Date of Service: Kevin Custard A. 04/25/2022 9:45 A M Medical Record Number: [id number] Patient Account Number: [id number] Date of Birth/Sex: Treating RN: 24-Aug-1940 (82 y.o. [name] Primary Care Provider: Steele Sizer Other Clinician: Referring Provider: Treating Provider/Extender: Osborne Oman Weeks in Treatment: [address] ([id number]) [id number] Page 4 of 7 Active Problems ICD-10 Encounter Code Description Active Date MDM Diagnosis L89.623 Pressure ulcer of left heel, stage 3 10/27/2021 No Yes L89.310 Pressure ulcer of right buttock, unstageable 12/08/2021 No Yes G80.8 Other cerebral palsy 10/27/2021 No Yes I73.89 Other specified peripheral vascular diseases 10/27/2021 No  Yes I87.393 Chronic venous hypertension (idiopathic) with other complications of bilateral 10/27/2021 No Yes lower extremity Inactive Problems Resolved Problems Electronic Signature(s) Signed: 04/25/2022 10:15:34 AM By: Worthy Keeler PA-C Entered By: Worthy Keeler on 04/25/2022 10:15:33 -------------------------------------------------------------------------------- Progress Note Details Patient Name: Date of Service: Kevin Custard A. 04/25/2022 9:45 A M Medical Record Number: [id number] Patient Account Number: [id number] Date of Birth/Sex: Treating RN: [birth year] (82 y.o. [name]) Carlene Coria Primary Care Provider: Steele Sizer Other Clinician: Referring Provider: Treating Provider/Extender: Osborne Oman Weeks in Treatment: 25 Subjective Chief Complaint Information obtained from Patient Left heel pressure ulcer and right buttock pressure ulcer History of Present Illness (HPI) 10-27-2021 upon evaluation today patient presents for initial evaluation here in the clinic concerning an issue that has been present since around June 1. Subsequently the patient's caregiver from [name] tells me that he has been treated mainly by herself she has been doing what she can to try to take care of this using DuoDERM over the area. There is a lot of skin that is actually loosen up over the top of the heel surface but not all of this is necessarily covering wound in fact there is a majority of this that is not wound related as far as the skin over top is concerned. Fortunately I do not see any signs of active infection locally or systemically at this time which is great news. He does have some slough over the surface of the wound but mainly the skin around the edges the thing I am most concerned with. It is trapping a lot of fluid to be honest. Patient does have a history of chronic venous insufficiency, cerebral palsy, and stated peripheral vascular disease. With that being said I do  not see any evidence right now of active infection which is great news. No fevers,  chills, nausea, vomiting, or diarrhea. 11-10-2021 upon evaluation today patient appears to be doing well currently in regard to his wound on the heel. Fortunately there does not appear to be any GARRON, ELINE ([id number]) [id number] Page 5 of 7 signs of active infection locally or systemically at this time which is great news. Fortunately I do not see any evidence of infection which is also excellent. No fevers, chills, nausea, vomiting, or diarrhea. 12-08-2021 upon evaluation today patient appears to be doing well currently in regard to his heel although unfortunately has a right gluteal pressure ulcer. Fortunately there does not appear to be any signs of active infection locally or systemically which is great news and overall I am extremely pleased with where we stand today. 12-29-2021 upon evaluation patient's heel as well as gluteal area both appear to be doing better. There does not appear to be any signs of active infection locally or systemically at this time which is great news. No fevers, chills, nausea, vomiting, or diarrhea. 01-19-2022 upon evaluation today patient appears to be doing well with regard to his wound. He has been tolerating the dressing changes without complication. Fortunately there does not appear to be any evidence of active infection locally or systemically at this time which is great news. No fevers, chills, nausea, vomiting, or diarrhea. 02-16-2022 upon evaluation today patient appears to be doing well currently in regard to his wounds in general everything is healed although he has a new wound on the left heel which is something that was not present previous. Again this is a little disconcerting especially considering the other wounds have completely closed and we were hoping he be ready for discharge today. Again I do believe that this is something that looks  like a pressure injury stage III. 03-14-2022 upon evaluation today patient appears to be doing poorly in regard to both his heels as well as his gluteal region. Unfortunately this has reopened now as a result of excess pressure and he does not seem to be offloaded as well as it should be. Unfortunately I do think that this is due to lack of turning and repositioning. There is nothing else that would account for the bruising that we are seeing at this point in regards to the deep tissue injury. I discussed that with the patient's caregiver with him today. I do think, to put in a call to the facility as well at [name] where he is currently residing and Gena Fray is the nurse there. Her phone number is [phone]. 03-21-2022 upon evaluation today patient's wounds are actually significantly better compared to last week's evaluation. Fortunately there does not appear to be any signs of infection locally or systemically which is great news and overall I am extremely pleased with where things stand currently. No fevers, chills, nausea, vomiting, or diarrhea. 04-04-2022 upon evaluation today patient appears to be doing well currently based on what I am seeing in regard to his heel wound this is pretty dry and I think it is probably pretty much healed. Fortunately I do not see any evidence of active infection locally nor systemically at this time. In regard to the gluteal wound this is showing signs of excellent improvement. 04-25-2022 upon evaluation patient's wound bed actually showed signs of doing well his gluteal wounds are healed and the heel might actually be healed although I want to monitor this a little bit longer before I write this off completely. Objective Constitutional Well-nourished and well-hydrated in no acute distress. Vitals  Time Taken: 9:57 AM, Height: 63 in, Weight: 135 lbs, BMI: 23.9, Temperature: 98.1 F, Pulse: 68 bpm, Respiratory Rate: 18 breaths/min, Blood Pressure: 96/78  mmHg. Respiratory normal breathing without difficulty. Psychiatric this patient is able to make decisions and demonstrates good insight into disease process. Alert and Oriented x 3. pleasant and cooperative. General Notes: Patient's wounds in the gluteal area again are completely closed and on the heel he has just what appears to be some discoloration with the majority of this having flaked off today I am not even certain there is an actual opening though I did leave it at 0.1 to monitor for the next couple of weeks just to be certain. Integumentary (Hair, Skin) Wound #2 status is Open. Original cause of wound was Pressure Injury. The date acquired was: 11/07/2021. The wound has been in treatment 19 weeks. The wound is located on the Right Gluteus. The wound measures 0cm length x 0cm width x 0cm depth; 0cm^2 area and 0cm^3 volume. There is no tunneling or undermining noted. There is a none present amount of drainage noted. There is no granulation within the wound bed. There is no necrotic tissue within the wound bed. Wound #3 status is Open. Original cause of wound was Pressure Injury. The date acquired was: 02/01/2022. The wound has been in treatment 9 weeks. The wound is located on the Left Calcaneus. The wound measures 0.1cm length x 0.1cm width x 0.1cm depth; 0.008cm^2 area and 0.001cm^3 volume. There is no tunneling or undermining noted. There is a none present amount of drainage noted. There is no granulation within the wound bed. There is a large (67-100%) amount of necrotic tissue within the wound bed including Eschar and Adherent Slough. Wound #4 status is Open. Original cause of wound was Pressure Injury. The date acquired was: 02/26/2022. The wound has been in treatment 6 weeks. The wound is located on the Left Gluteus. The wound measures 0cm length x 0cm width x 0cm depth; 0cm^2 area and 0cm^3 volume. There is no tunneling or undermining noted. There is a none present amount of drainage  noted. There is no granulation within the wound bed. There is no necrotic tissue within the wound bed. Assessment Active Problems ICD-10 Pressure ulcer of left heel, stage 3 Pressure ulcer of right buttock, unstageable Other cerebral palsy KENNIEL, BERGSMA ([id number]) [id number] Page 6 of 7 Other specified peripheral vascular diseases Chronic venous hypertension (idiopathic) with other complications of bilateral lower extremity Plan Follow-up Appointments: Return Appointment in 1 week. Edema Control - Lymphedema / Segmental Compressive Device / Other: Elevate, Exercise Daily and Avoid Standing for Long Periods of Time. Elevate legs to the level of the heart and pump ankles as often as possible Elevate leg(s) parallel to the floor when sitting. Off-Loading: Heel suspension boot Turn and reposition every 2 hours WOUND #3: - Calcaneus Wound Laterality: Left Topical: Betadine 1 x Per Day/30 Days Discharge Instructions: Apply betadine as directed. Secondary Dressing: ABD Pad 5x9 (in/in) 1 x Per Day/30 Days Discharge Instructions: Cover with ABD pad Secured With: Kerlix Roll Sterile or Non-Sterile 6-ply 4.5x4 (yd/yd) 1 x Per Day/30 Days Discharge Instructions: Apply Kerlix as directed 1. Based on what I am seeing I do believe that the patient is doing quite well. Will get a continue with the Betadine followed by the ABD pad and roll gauze to secure in place to the heel which I think is doing well for protection. 2. He should also continue with the Prevalon offloading boot which  I think is doing a good job here. We will see patient back for reevaluation in 2 weeks here in the clinic. If anything worsens or changes patient will contact our office for additional recommendations. Electronic Signature(s) Signed: 04/25/2022 5:04:21 PM By: Worthy Keeler PA-C Entered By: Worthy Keeler on 04/25/2022  17:04:21 -------------------------------------------------------------------------------- SuperBill Details Patient Name: Date of Service: Kevin Custard A. 04/25/2022 Medical Record Number: [id number] Patient Account Number: [id number] Date of Birth/Sex: Treating RN: 14-Feb-1941 (82 y.o. [name]) Carlene Coria Primary Care Provider: Steele Sizer Other Clinician: Referring Provider: Treating Provider/Extender: Osborne Oman Weeks in Treatment: 25 Diagnosis Coding ICD-10 Codes Code Description [phone] Pressure ulcer of left heel, stage 3 L89.310 Pressure ulcer of right buttock, unstageable G80.8 Other cerebral palsy I73.89 Other specified peripheral vascular diseases I87.393 Chronic venous hypertension (idiopathic) with other complications of bilateral lower extremity Facility Procedures : IZMAEL, DUROSS Code: [id number] NIGEL ERICSSON ([id number]) Description: [facility] VISIT-LEV 3 EST PT [id number] Modifier: [id number] Page 7 Quantity: 1 of 7 Physician Procedures : CPT4 Code Description Modifier [id number] - WC PHYS LEVEL 3 - EST PT ICD-10 Diagnosis Description L89.623 Pressure ulcer of left heel, stage 3 L89.310 Pressure ulcer of right buttock, unstageable G80.8 Other cerebral palsy I73.89 Other specified  peripheral vascular diseases Quantity: 1 Electronic Signature(s) Signed: 04/25/2022 5:04:43 PM By: Worthy Keeler PA-C Entered By: Worthy Keeler on 04/25/2022 17:04:43

## 2022-05-04 ENCOUNTER — Ambulatory Visit
Admission: RE | Admit: 2022-05-04 | Discharge: 2022-05-04 | Disposition: A | Discharge status: Home / Self Care | Payer: 59 | Source: Ambulatory Visit | Attending: Urology | Admitting: Urology

## 2022-05-04 DIAGNOSIS — L89623 Pressure ulcer of left heel, stage 3: Secondary | ICD-10-CM | POA: Diagnosis not present

## 2022-05-04 DIAGNOSIS — Z87442 Personal history of urinary calculi: Secondary | ICD-10-CM | POA: Diagnosis not present

## 2022-05-04 DIAGNOSIS — N200 Calculus of kidney: Secondary | ICD-10-CM | POA: Diagnosis not present

## 2022-05-04 DIAGNOSIS — N281 Cyst of kidney, acquired: Secondary | ICD-10-CM | POA: Diagnosis not present

## 2022-05-04 DIAGNOSIS — G809 Cerebral palsy, unspecified: Secondary | ICD-10-CM | POA: Diagnosis not present

## 2022-05-04 DIAGNOSIS — L89300 Pressure ulcer of unspecified buttock, unstageable: Secondary | ICD-10-CM | POA: Diagnosis not present

## 2022-05-04 DIAGNOSIS — N3289 Other specified disorders of bladder: Secondary | ICD-10-CM | POA: Diagnosis not present

## 2022-05-08 ENCOUNTER — Other Ambulatory Visit: Payer: Self-pay | Admitting: Family Medicine

## 2022-05-09 ENCOUNTER — Encounter: Payer: 59 | Admitting: Physician Assistant

## 2022-05-09 DIAGNOSIS — L89623 Pressure ulcer of left heel, stage 3: Secondary | ICD-10-CM | POA: Diagnosis not present

## 2022-05-09 DIAGNOSIS — I87393 Chronic venous hypertension (idiopathic) with other complications of bilateral lower extremity: Secondary | ICD-10-CM | POA: Diagnosis not present

## 2022-05-09 DIAGNOSIS — L89310 Pressure ulcer of right buttock, unstageable: Secondary | ICD-10-CM | POA: Diagnosis not present

## 2022-05-09 DIAGNOSIS — I872 Venous insufficiency (chronic) (peripheral): Secondary | ICD-10-CM | POA: Diagnosis not present

## 2022-05-09 DIAGNOSIS — G809 Cerebral palsy, unspecified: Secondary | ICD-10-CM | POA: Diagnosis not present

## 2022-05-09 DIAGNOSIS — I739 Peripheral vascular disease, unspecified: Secondary | ICD-10-CM | POA: Diagnosis not present

## 2022-05-09 NOTE — Progress Notes (Addendum)
KENTAVIOUS, MICHELE ([id number]) [id number] Page 1 of 6 Visit Report for 05/09/2022 Chief Complaint Document Details Patient Name: Date of Service: [name] 05/09/2022 12:30 PM Medical Record Number: [id number] Patient Account Number: [id number] Date of Birth/Sex: Treating RN: 09-Jun-1940 (82 y.o. Verl Blalock Primary Care Provider: Steele Sizer Other Clinician: Referring Provider: Treating Provider/Extender: Osborne Oman Weeks in Treatment: 27 Information Obtained from: Patient Chief Complaint Left heel pressure ulcer and right buttock pressure ulcer Electronic Signature(s) Signed: 05/09/2022 12:58:40 PM By: Worthy Keeler PA-C Entered By: Worthy Keeler on 05/09/2022 12:58:40 -------------------------------------------------------------------------------- HPI Details Patient Name: Date of Service: Kevin Custard A. 05/09/2022 12:30 PM Medical Record Number: [id number] Patient Account Number: [id number] Date of Birth/Sex: Treating RN: [birth year] (82 y.o. Verl Blalock Primary Care Provider: Steele Sizer Other Clinician: Referring Provider: Treating Provider/Extender: Osborne Oman Weeks in Treatment: 27 History of Present Illness HPI Description: 10-27-2021 upon evaluation today patient presents for initial evaluation here in the clinic concerning an issue that has been present since around June 1. Subsequently the patient's caregiver from [name] tells me that he has been treated mainly by herself she has been doing what she can to try to take care of this using DuoDERM over the area. There is a lot of skin that is actually loosen up over the top of the heel surface but not all of this is necessarily covering wound in fact there is a majority of this that is not wound related as far as the skin over top is concerned. Fortunately I do not see any signs of active infection locally or systemically at this  time which is great news. He does have some slough over the surface of the wound but mainly the skin around the edges the thing I am most concerned with. It is trapping a lot of fluid to be honest. Patient does have a history of chronic venous insufficiency, cerebral palsy, and stated peripheral vascular disease. With that being said I do not see any evidence right now of active infection which is great news. No fevers, chills, nausea, vomiting, or diarrhea. 11-10-2021 upon evaluation today patient appears to be doing well currently in regard to his wound on the heel. Fortunately there does not appear to be any signs of active infection locally or systemically at this time which is great news. Fortunately I do not see any evidence of infection which is also excellent. No fevers, chills, nausea, vomiting, or diarrhea. 12-08-2021 upon evaluation today patient appears to be doing well currently in regard to his heel although unfortunately has a right gluteal pressure ulcer. Fortunately there does not appear to be any signs of active infection locally or systemically which is great news and overall I am extremely pleased with where we stand today. [name], [name] ([id number]) [id number] Page 2 of 6 12-29-2021 upon evaluation patient's heel as well as gluteal area both appear to be doing better. There does not appear to be any signs of active infection locally or systemically at this time which is great news. No fevers, chills, nausea, vomiting, or diarrhea. 01-19-2022 upon evaluation today patient appears to be doing well with regard to his wound. He has been tolerating the dressing changes without complication. Fortunately there does not appear to be any evidence of active infection locally or systemically at this time which is great news. No fevers, chills, nausea, vomiting, or diarrhea. 02-16-2022 upon evaluation today patient appears to be doing well  currently in regard to  his wounds in general everything is healed although he has a new wound on the left heel which is something that was not present previous. Again this is a little disconcerting especially considering the other wounds have completely closed and we were hoping he be ready for discharge today. Again I do believe that this is something that looks like a pressure injury stage III. 03-14-2022 upon evaluation today patient appears to be doing poorly in regard to both his heels as well as his gluteal region. Unfortunately this has reopened now as a result of excess pressure and he does not seem to be offloaded as well as it should be. Unfortunately I do think that this is due to lack of turning and repositioning. There is nothing else that would account for the bruising that we are seeing at this point in regards to the deep tissue injury. I discussed that with the patient's caregiver with him today. I do think, to put in a call to the facility as well at [name] where he is currently residing and Gena Fray is the nurse there. Her phone number is [phone]. 03-21-2022 upon evaluation today patient's wounds are actually significantly better compared to last week's evaluation. Fortunately there does not appear to be any signs of infection locally or systemically which is great news and overall I am extremely pleased with where things stand currently. No fevers, chills, nausea, vomiting, or diarrhea. 04-04-2022 upon evaluation today patient appears to be doing well currently based on what I am seeing in regard to his heel wound this is pretty dry and I think it is probably pretty much healed. Fortunately I do not see any evidence of active infection locally nor systemically at this time. In regard to the gluteal wound this is showing signs of excellent improvement. 04-25-2022 upon evaluation patient's wound bed actually showed signs of doing well his gluteal wounds are healed and the heel might actually be  healed although I want to monitor this a little bit longer before I write this off completely. 05-09-2022 upon evaluation today patient appears to be doing excellent. In fact he appears to be completely healed based on what I am seeing currently. Fortunately I do not see any evidence of active infection locally nor systemically at this time. Electronic Signature(s) Signed: 05/09/2022 3:45:37 PM By: Worthy Keeler PA-C Entered By: Worthy Keeler on 05/09/2022 15:45:37 -------------------------------------------------------------------------------- Physical Exam Details Patient Name: Date of Service: [name] 05/09/2022 12:30 PM Medical Record Number: [id number] Patient Account Number: [id number] Date of Birth/Sex: Treating RN: March 25, 1941 (82 y.o. Verl Blalock Primary Care Provider: Steele Sizer Other Clinician: Referring Provider: Treating Provider/Extender: Osborne Oman Weeks in Treatment: 66 Constitutional Well-nourished and well-hydrated in no acute distress. Respiratory normal breathing without difficulty. Psychiatric this patient is able to make decisions and demonstrates good insight into disease process. Alert and Oriented x 3. pleasant and cooperative. Notes Upon inspection patient's wound bed actually showed signs of good granulation and epithelization at this point. Fortunately I think that he is completely healed that he will does not show any signs of wanting open I think as long as it is protected and the Prevalon offloading boots were used he should be just fine. Electronic Signature(s) Signed: 05/09/2022 3:45:53 PM By: Worthy Keeler PA-C Entered By: Worthy Keeler on 05/09/2022 15:45:53 [name] ([id number]) [id number] Page 3 of 6 -------------------------------------------------------------------------------- Physician Orders Details Patient Name: Date of Service: A  Florinda Marker A. 05/09/2022  12:30 PM Medical Record Number: [id number] Patient Account Number: [id number] Date of Birth/Sex: Treating RN: 03/17/1941 (82 y.o. Verl Blalock Primary Care Provider: Steele Sizer Other Clinician: Referring Provider: Treating Provider/Extender: Osborne Oman Weeks in Treatment: 27 Verbal / Phone Orders: No Diagnosis Coding ICD-10 Coding Code Description [phone] Pressure ulcer of left heel, stage 3 L89.310 Pressure ulcer of right buttock, unstageable G80.8 Other cerebral palsy I73.89 Other specified peripheral vascular diseases I87.393 Chronic venous hypertension (idiopathic) with other complications of bilateral lower extremity Discharge From [facility] Discharge from [facility] Wear compression garments daily. Put garments on first thing when you wake up and remove them before bed. Moisturize legs daily after removing compression garments. Elevate, Exercise Daily and A void Standing for Long Periods of Time. DO YOUR BEST to sleep in the bed at night. DO NOT sleep in your recliner. Long hours of sitting in a recliner leads to swelling of the legs and/or potential wounds on your backside. Electronic Signature(s) Signed: 05/09/2022 1:28:31 PM By: Gretta Cool, BSN, RN, CWS, Kim RN, BSN Signed: 05/09/2022 5:09:00 PM By: Worthy Keeler PA-C Entered By: Gretta Cool BSN, RN, CWS, Kim on 05/09/2022 13:28:31 -------------------------------------------------------------------------------- Problem List Details Patient Name: Date of Service: Kevin Custard A. 05/09/2022 12:30 PM Medical Record Number: [id number] Patient Account Number: [id number] Date of Birth/Sex: Treating RN: 09-20-1940 (82 y.o. Verl Blalock Primary Care Provider: Steele Sizer Other Clinician: Referring Provider: Treating Provider/Extender: Osborne Oman Weeks in Treatment: 27 Active Problems ICD-10 Encounter Code Description Active Date MDM Diagnosis [phone]  Pressure ulcer of left heel, stage 3 10/27/2021 No Yes [name], [name] ([id number]) [id number] Page 4 of 6 L89.310 Pressure ulcer of right buttock, unstageable 12/08/2021 No Yes G80.8 Other cerebral palsy 10/27/2021 No Yes I73.89 Other specified peripheral vascular diseases 10/27/2021 No Yes I87.393 Chronic venous hypertension (idiopathic) with other complications of bilateral 10/27/2021 No Yes lower extremity Inactive Problems Resolved Problems Electronic Signature(s) Signed: 05/09/2022 12:58:36 PM By: Worthy Keeler PA-C Entered By: Worthy Keeler on 05/09/2022 12:58:36 -------------------------------------------------------------------------------- Progress Note Details Patient Name: Date of Service: Kevin Custard A. 05/09/2022 12:30 PM Medical Record Number: [id number] Patient Account Number: [id number] Date of Birth/Sex: Treating RN: 07/14/1940 (82 y.o. Verl Blalock Primary Care Provider: Steele Sizer Other Clinician: Referring Provider: Treating Provider/Extender: Osborne Oman Weeks in Treatment: 27 Subjective Chief Complaint Information obtained from Patient Left heel pressure ulcer and right buttock pressure ulcer History of Present Illness (HPI) 10-27-2021 upon evaluation today patient presents for initial evaluation here in the clinic concerning an issue that has been present since around June 1. Subsequently the patient's caregiver from [name] tells me that he has been treated mainly by herself she has been doing what she can to try to take care of this using DuoDERM over the area. There is a lot of skin that is actually loosen up over the top of the heel surface but not all of this is necessarily covering wound in fact there is a majority of this that is not wound related as far as the skin over top is concerned. Fortunately I do not see any signs of active infection locally or systemically at this time which is great news.  He does have some slough over the surface of the wound but mainly the skin around the edges the thing I am most concerned with. It is trapping a lot of fluid to be  honest. Patient does have a history of chronic venous insufficiency, cerebral palsy, and stated peripheral vascular disease. With that being said I do not see any evidence right now of active infection which is great news. No fevers, chills, nausea, vomiting, or diarrhea. 11-10-2021 upon evaluation today patient appears to be doing well currently in regard to his wound on the heel. Fortunately there does not appear to be any signs of active infection locally or systemically at this time which is great news. Fortunately I do not see any evidence of infection which is also excellent. No fevers, chills, nausea, vomiting, or diarrhea. 12-08-2021 upon evaluation today patient appears to be doing well currently in regard to his heel although unfortunately has a right gluteal pressure ulcer. Fortunately there does not appear to be any signs of active infection locally or systemically which is great news and overall I am extremely pleased with where we stand today. 12-29-2021 upon evaluation patient's heel as well as gluteal area both appear to be doing better. There does not appear to be any signs of active infection locally [name], [name] ([id number]) [id number] Page 5 of 6 or systemically at this time which is great news. No fevers, chills, nausea, vomiting, or diarrhea. 01-19-2022 upon evaluation today patient appears to be doing well with regard to his wound. He has been tolerating the dressing changes without complication. Fortunately there does not appear to be any evidence of active infection locally or systemically at this time which is great news. No fevers, chills, nausea, vomiting, or diarrhea. 02-16-2022 upon evaluation today patient appears to be doing well currently in regard to his wounds in general  everything is healed although he has a new wound on the left heel which is something that was not present previous. Again this is a little disconcerting especially considering the other wounds have completely closed and we were hoping he be ready for discharge today. Again I do believe that this is something that looks like a pressure injury stage III. 03-14-2022 upon evaluation today patient appears to be doing poorly in regard to both his heels as well as his gluteal region. Unfortunately this has reopened now as a result of excess pressure and he does not seem to be offloaded as well as it should be. Unfortunately I do think that this is due to lack of turning and repositioning. There is nothing else that would account for the bruising that we are seeing at this point in regards to the deep tissue injury. I discussed that with the patient's caregiver with him today. I do think, to put in a call to the facility as well at [name] where he is currently residing and Gena Fray is the nurse there. Her phone number is [phone]. 03-21-2022 upon evaluation today patient's wounds are actually significantly better compared to last week's evaluation. Fortunately there does not appear to be any signs of infection locally or systemically which is great news and overall I am extremely pleased with where things stand currently. No fevers, chills, nausea, vomiting, or diarrhea. 04-04-2022 upon evaluation today patient appears to be doing well currently based on what I am seeing in regard to his heel wound this is pretty dry and I think it is probably pretty much healed. Fortunately I do not see any evidence of active infection locally nor systemically at this time. In regard to the gluteal wound this is showing signs of excellent improvement. 04-25-2022 upon evaluation patient's wound bed actually showed signs of doing well  his gluteal wounds are healed and the heel might actually be healed although  I want to monitor this a little bit longer before I write this off completely. 05-09-2022 upon evaluation today patient appears to be doing excellent. In fact he appears to be completely healed based on what I am seeing currently. Fortunately I do not see any evidence of active infection locally nor systemically at this time. Objective Constitutional Well-nourished and well-hydrated in no acute distress. Vitals Time Taken: 12:39 PM, Height: 63 in, Weight: 135 lbs, BMI: 23.9, Pulse: 52 bpm, Respiratory Rate: 18 breaths/min, Blood Pressure: 103/60 mmHg. Respiratory normal breathing without difficulty. Psychiatric this patient is able to make decisions and demonstrates good insight into disease process. Alert and Oriented x 3. pleasant and cooperative. General Notes: Upon inspection patient's wound bed actually showed signs of good granulation and epithelization at this point. Fortunately I think that he is completely healed that he will does not show any signs of wanting open I think as long as it is protected and the Prevalon offloading boots were used he should be just fine. Integumentary (Hair, Skin) Wound #3 status is Healed - Epithelialized. Original cause of wound was Pressure Injury. The date acquired was: 02/01/2022. The wound has been in treatment 11 weeks. The wound is located on the Left Calcaneus. The wound measures 0cm length x 0cm width x 0cm depth; 0cm^2 area and 0cm^3 volume. There is a none present amount of drainage noted. Assessment Active Problems ICD-10 Pressure ulcer of left heel, stage 3 Pressure ulcer of right buttock, unstageable Other cerebral palsy Other specified peripheral vascular diseases Chronic venous hypertension (idiopathic) with other complications of bilateral lower extremity Plan Discharge From [facility]: Discharge from [facility] Wear compression garments daily. Put garments on first thing when you wake up and remove them  before bed. Moisturize legs daily after removing compression garments. Elevate, Exercise Daily and Avoid Standing for Long Periods of Time. [name], [name] ([id number]) [id number] Page 6 of 6 DO YOUR BEST to sleep in the bed at night. DO NOT sleep in your recliner. Long hours of sitting in a recliner leads to swelling of the legs and/or potential wounds on your backside. 1. I am recommend that we continue to monitor for any signs of infection at the home. I do think they take good care of him and both his heel as well as his bottom are healed completely which is great news I am very pleased in that regard. 2. I am also can recommend the patient should continue with appropriate offloading regard to both the gluteal region as well as his heels he does have the appropriate things at the facility in order to do this that is great news. We will see patient back for reevaluation in 1 week here in the clinic. If anything worsens or changes patient will contact our office for additional recommendations. Electronic Signature(s) Signed: 05/09/2022 3:46:32 PM By: Worthy Keeler PA-C Entered By: Worthy Keeler on 05/09/2022 15:46:31 -------------------------------------------------------------------------------- SuperBill Details Patient Name: Date of Service: Kevin Custard A. 05/09/2022 Medical Record Number: [id number] Patient Account Number: [id number] Date of Birth/Sex: Treating RN: September 27, 1940 (82 y.o. Verl Blalock Primary Care Provider: Steele Sizer Other Clinician: Referring Provider: Treating Provider/Extender: Osborne Oman Weeks in Treatment: 27 Diagnosis Coding ICD-10 Codes Code Description [phone] Pressure ulcer of left heel, stage 3 L89.310 Pressure ulcer of right buttock, unstageable G80.8 Other cerebral palsy I73.89 Other specified peripheral vascular diseases  S01.093 Chronic venous hypertension (idiopathic) with other  complications of bilateral lower extremity Facility Procedures : CPT4 Code: [id number] Description: 9211 - WOUND CARE VISIT-LEV 1 EST PT Modifier: Quantity: 1 Physician Procedures : CPT4 Code Description Modifier [id number] - WC PHYS LEVEL 3 - EST PT ICD-10 Diagnosis Description L89.623 Pressure ulcer of left heel, stage 3 L89.310 Pressure ulcer of right buttock, unstageable G80.8 Other cerebral palsy I73.89 Other specified  peripheral vascular diseases Quantity: 1 Electronic Signature(s) Signed: 05/09/2022 3:46:45 PM By: Worthy Keeler PA-C Previous Signature: 05/09/2022 1:29:16 PM Version By: Gretta Cool, BSN, RN, CWS, Kim RN, BSN Entered By: Worthy Keeler on 05/09/2022 15:46:44

## 2022-05-09 NOTE — Progress Notes (Signed)
EVANN, ERAZO ([id number]) [id number] Page 1 of 7 Visit Report for 05/09/2022 Arrival Information Details Patient Name: Date of Service: [name] 05/09/2022 12:30 PM Medical Record Number: [id number] Patient Account Number: [id number] Date of Birth/Sex: Treating RN: [birth year] (82 y.o. [name] Primary Care Cannen Dupras: Steele Sizer Other Clinician: Referring Andry Bogden: Treating Machi Whittaker/Extender: Osborne Oman Weeks in Treatment: 27 Visit Information History Since Last Visit Added or deleted any medications: No Patient Arrived: Wheel Chair Pain Present Now: No Arrival Time: 12:39 Accompanied By: caregiver Transfer Assistance: None Patient Identification Verified: Yes Secondary Verification Process Completed: Yes Patient Requires Transmission-Based Precautions: No Patient Has Alerts: No Electronic Signature(s) Signed: 05/09/2022 2:51:16 PM By: Gretta Cool, BSN, RN, CWS, Kim RN, BSN Entered By: Gretta Cool, BSN, RN, CWS, Kim on 05/09/2022 12:39:42 -------------------------------------------------------------------------------- Clinic Level of Care Assessment Details Patient Name: Date of Service: Kevin Custard A. 05/09/2022 12:30 PM Medical Record Number: [id number] Patient Account Number: [id number] Date of Birth/Sex: Treating RN: Aug 11, 1940 (82 y.o. [name] Primary Care Zakir Henner: Steele Sizer Other Clinician: Referring Dewain Platz: Treating Tashica Provencio/Extender: Osborne Oman Weeks in Treatment: 27 Clinic Level of Care Assessment Items TOOL 4 Quantity Score '[]'$  - 0 Use when only an EandM is performed on FOLLOW-UP visit ASSESSMENTS - Nursing Assessment / Reassessment X- 1 10 Reassessment of Co-morbidities (includes updates in patient status) X- 1 5 Reassessment of Adherence to Treatment Plan ASSESSMENTS - Wound and Skin A ssessment / Reassessment X - Simple Wound Assessment / Reassessment - one wound 1  5 '[]'$  - 0 Complex Wound Assessment / Reassessment - multiple wounds BRECK, [place] ([id number]) [id number] Page 2 of 7 '[]'$  - 0 Dermatologic / Skin Assessment (not related to wound area) ASSESSMENTS - Focused Assessment '[]'$  - 0 Circumferential Edema Measurements - multi extremities '[]'$  - 0 Nutritional Assessment / Counseling / Intervention '[]'$  - 0 Lower Extremity Assessment (monofilament, tuning fork, pulses) '[]'$  - 0 Peripheral Arterial Disease Assessment (using hand held doppler) ASSESSMENTS - Ostomy and/or Continence Assessment and Care '[]'$  - 0 Incontinence Assessment and Management '[]'$  - 0 Ostomy Care Assessment and Management (repouching, etc.) PROCESS - Coordination of Care X - Simple Patient / Family Education for ongoing care 1 15 '[]'$  - 0 Complex (extensive) Patient / Family Education for ongoing care X- 1 10 Staff obtains Programmer, systems, Records, T Results / Process Orders est '[]'$  - 0 Staff telephones HHA, Nursing Homes / Clarify orders / etc '[]'$  - 0 Routine Transfer to another Facility (non-emergent condition) '[]'$  - 0 Routine Hospital Admission (non-emergent condition) '[]'$  - 0 New Admissions / Biomedical engineer / Ordering NPWT Apligraf, etc. , '[]'$  - 0 Emergency Hospital Admission (emergent condition) X- 1 10 Simple Discharge Coordination '[]'$  - 0 Complex (extensive) Discharge Coordination PROCESS - Special Needs '[]'$  - 0 Pediatric / Minor Patient Management '[]'$  - 0 Isolation Patient Management '[]'$  - 0 Hearing / Language / Visual special needs '[]'$  - 0 Assessment of Community assistance (transportation, D/C planning, etc.) '[]'$  - 0 Additional assistance / Altered mentation '[]'$  - 0 Support Surface(s) Assessment (bed, cushion, seat, etc.) INTERVENTIONS - Wound Cleansing / Measurement X - Simple Wound Cleansing - one wound 1 5 '[]'$  - 0 Complex Wound Cleansing - multiple wounds X- 1 5 Wound Imaging (photographs - any number of wounds) '[]'$  - 0 Wound  Tracing (instead of photographs) X- 1 5 Simple Wound Measurement - one wound '[]'$  - 0 Complex Wound Measurement - multiple wounds INTERVENTIONS - Wound Dressings '[]'$  -  0 Small Wound Dressing one or multiple wounds '[]'$  - 0 Medium Wound Dressing one or multiple wounds '[]'$  - 0 Large Wound Dressing one or multiple wounds '[]'$  - 0 Application of Medications - topical '[]'$  - 0 Application of Medications - injection INTERVENTIONS - Miscellaneous '[]'$  - 0 External ear exam '[]'$  - 0 Specimen Collection (cultures, biopsies, blood, body fluids, etc.) '[]'$  - 0 Specimen(s) / Culture(s) sent or taken to Lab for analysis [name], [name] ([id number]) [id number] Page 3 of 7 '[]'$  - 0 Patient Transfer (multiple staff / Civil Service fast streamer / Similar devices) '[]'$  - 0 Simple Staple / Suture removal (25 or less) '[]'$  - 0 Complex Staple / Suture removal (26 or more) '[]'$  - 0 Hypo / Hyperglycemic Management (close monitor of Blood Glucose) '[]'$  - 0 Ankle / Brachial Index (ABI) - do not check if billed separately X- 1 5 Vital Signs Has the patient been seen at the hospital within the last three years: Yes Total Score: 75 Level Of Care: New/Established - Level 2 Electronic Signature(s) Signed: 05/09/2022 2:51:16 PM By: Gretta Cool, BSN, RN, CWS, Kim RN, BSN Entered By: Gretta Cool, BSN, RN, CWS, Kim on 05/09/2022 13:29:08 -------------------------------------------------------------------------------- Encounter Discharge Information Details Patient Name: Date of Service: Kevin Gauze M A. 05/09/2022 12:30 PM Medical Record Number: [id number] Patient Account Number: [id number] Date of Birth/Sex: Treating RN: 10/04/1940 (82 y.o. [name] Primary Care Oney Folz: Steele Sizer Other Clinician: Referring Zyriah Mask: Treating Andre Swander/Extender: Osborne Oman Weeks in Treatment: 27 Encounter Discharge Information Items Discharge Condition: Stable Ambulatory Status: Wheelchair Discharge  Destination: Home Transportation: Private Auto Accompanied By: caregiver Schedule Follow-up Appointment: No Clinical Summary of Care: Electronic Signature(s) Signed: 05/09/2022 1:31:07 PM By: Gretta Cool, BSN, RN, CWS, Kim RN, BSN Entered By: Gretta Cool, BSN, RN, CWS, Kim on 05/09/2022 13:31:07 -------------------------------------------------------------------------------- Lower Extremity Assessment Details Patient Name: Date of Service: Kevin Custard A. 05/09/2022 12:30 PM Medical Record Number: [id number] Patient Account Number: [id number] Date of Birth/Sex: Treating RN: [birth year] (82 y.o. [name] Primary Care Emerald Gehres: Steele Sizer Other Clinician: Referring Lyrick Worland: Treating Jearline Hirschhorn/Extender: Roxanne Mins ([id number]) [id number] Page 4 of 7 Weeks in Treatment: 27 Electronic Signature(s) Signed: 05/09/2022 1:27:32 PM By: Gretta Cool, BSN, RN, CWS, Kim RN, BSN Entered By: Gretta Cool, BSN, RN, CWS, Kim on 05/09/2022 13:27:32 -------------------------------------------------------------------------------- Multi Wound Chart Details Patient Name: Date of Service: Kevin Custard A. 05/09/2022 12:30 PM Medical Record Number: [id number] Patient Account Number: [id number] Date of Birth/Sex: Treating RN: 1940/11/27 (82 y.o. [name], [name] Primary Care Lynessa Almanzar: Steele Sizer Other Clinician: Referring Deina Lipsey: Treating Kenzlei Runions/Extender: Osborne Oman Weeks in Treatment: 27 Vital Signs Height(in): 63 Pulse(bpm): 52 Weight(lbs): 135 Blood Pressure(mmHg): 103/60 Body Mass Index(BMI): 23.9 Temperature(F): Respiratory Rate(breaths/min): 18 [3:Photos: No Photos Left Calcaneus Wound Location: Pressure Injury Wounding Event: Pressure Ulcer Primary Etiology: 02/01/2022 Date Acquired: 11 Weeks of Treatment: Healed - Epithelialized Wound Status: No Wound Recurrence: 0x0x0 Measurements L x W x D (cm) 0 A  (cm) : rea 0  Volume (cm) : 100.00% % Reduction in A rea: 100.00% % Reduction in Volume: Category/Stage III Classification: None Present Exudate A mount:] [N/A:N/A N/A N/A N/A N/A N/A N/A N/A N/A N/A N/A N/A N/A N/A N/A] Treatment Notes Electronic Signature(s) Signed: 05/09/2022 1:27:38 PM By: Gretta Cool, BSN, RN, CWS, Kim RN, BSN Entered By: Gretta Cool, BSN, RN, CWS, Kim on 05/09/2022 13:27:38 [name] ([id number]) [id number] Page 5 of 7 -------------------------------------------------------------------------------- Multi-Disciplinary Care Plan Details Patient Name: Date of  Service: [name] 05/09/2022 12:30 PM Medical Record Number: [id number] Patient Account Number: [id number] Date of Birth/Sex: Treating RN: 01-14-1941 (82 y.o. [name] Primary Care Laurann Mcmorris: Steele Sizer Other Clinician: Referring Dariel Pellecchia: Treating Starleen Trussell/Extender: Osborne Oman Weeks in Treatment: 27 Active Inactive Electronic Signature(s) Signed: 05/09/2022 1:30:03 PM By: Gretta Cool, BSN, RN, CWS, Kim RN, BSN Entered By: Gretta Cool, BSN, RN, CWS, Kim on 05/09/2022 13:30:03 -------------------------------------------------------------------------------- Pain Assessment Details Patient Name: Date of Service: Kevin Custard A. 05/09/2022 12:30 PM Medical Record Number: [id number] Patient Account Number: [id number] Date of Birth/Sex: Treating RN: 1940/09/10 (82 y.o. [name] Primary Care Demya Scruggs: Steele Sizer Other Clinician: Referring Aislin Onofre: Treating Jimmi Sidener/Extender: Osborne Oman Weeks in Treatment: 27 Active Problems Location of Pain Severity and Description of Pain Patient Has Paino No Site Locations Pain Management and Medication Current Pain Management: Notes Patient states no pain at this time. Electronic Signature(s) Signed: 05/09/2022 2:51:16 PM By: Gretta Cool, BSN, RN, CWS, Kim RN, BSN Entered By: Gretta Cool, BSN, RN, CWS, Kim on 05/09/2022  12:40:40 [name] ([id number]) [id number] Page 6 of 7 -------------------------------------------------------------------------------- Patient/Caregiver Education Details Patient Name: Date of Service: [name] 1/16/2024andnbsp12:30 PM Medical Record Number: [id number] Patient Account Number: [id number] Date of Birth/Gender: Treating RN: 02/20/1941 (82 y.o. [name] Primary Care Physician: Steele Sizer Other Clinician: Referring Physician: Treating Physician/Extender: Rogelio Seen in Treatment: 27 Education Assessment Education Provided To: Patient Education Topics Provided Venous: Handouts: Controlling Swelling with Compression Stockings Methods: Demonstration, Explain/Verbal Responses: State content correctly [employer]) Signed: 05/09/2022 2:51:16 PM By: Gretta Cool, BSN, RN, CWS, Kim RN, BSN Entered By: Gretta Cool, BSN, RN, CWS, Kim on 05/09/2022 13:29:48 -------------------------------------------------------------------------------- Wound Assessment Details Patient Name: Date of Service: Kevin Custard A. 05/09/2022 12:30 PM Medical Record Number: [id number] Patient Account Number: [id number] Date of Birth/Sex: Treating RN: Sep 03, 1940 (82 y.o. [name] Primary Care Masin Shatto: Steele Sizer Other Clinician: Referring Jorgen Wolfinger: Treating Monita Swier/Extender: Osborne Oman Weeks in Treatment: 27 Wound Status Wound Number: 3 Primary Etiology: Pressure Ulcer Wound Location: Left Calcaneus Wound Status: Healed - Epithelialized Wounding Event: Pressure Injury Date Acquired: 02/01/2022 Weeks Of Treatment: 11 Clustered Wound: No Photos [name], [name] ([id number]) [id number] Page 7 of 7 Photo Uploaded By: Gretta Cool, BSN, RN, CWS, Kim on 05/09/2022 14:08:53 Wound Measurements Length: (cm) Width: (cm) Depth: (cm) Area: (cm) Volume: (cm) 0 %  Reduction in Area: 100% 0 % Reduction in Volume: 100% 0 0 0 Wound Description Classification: Category/Stage III Exudate Amount: None Present Electronic Signature(s) Signed: 05/09/2022 2:51:16 PM By: Gretta Cool, BSN, RN, CWS, Kim RN, BSN Entered By: Gretta Cool, BSN, RN, CWS, Kim on 05/09/2022 12:47:29 -------------------------------------------------------------------------------- Vitals Details Patient Name: Date of Service: Kevin Gauze M A. 05/09/2022 12:30 PM Medical Record Number: [id number] Patient Account Number: [id number] Date of Birth/Sex: Treating RN: 04/16/1941 (82 y.o. [name], [name] Primary Care Ikeya Brockel: Steele Sizer Other Clinician: Referring Lavi Sheehan: Treating Jerrico Covello/Extender: Osborne Oman Weeks in Treatment: 27 Vital Signs Time Taken: 12:39 Pulse (bpm): 52 Height (in): 63 Respiratory Rate (breaths/min): 18 Weight (lbs): 135 Blood Pressure (mmHg): 103/60 Body Mass Index (BMI): 23.9 Reference Range: 80 - 120 mg / dl Electronic Signature(s) Signed: 05/09/2022 2:51:16 PM By: Gretta Cool, BSN, RN, CWS, Kim RN, BSN Entered By: Gretta Cool, BSN, RN, CWS, Kim on 05/09/2022 12:40:03

## 2022-05-19 ENCOUNTER — Other Ambulatory Visit: Payer: Self-pay | Admitting: Family Medicine

## 2022-05-22 NOTE — Progress Notes (Deleted)
05/23/2022 1:16 PM   [name] 1940/06/10 PH:[id number]  Referring provider: Steele Sizer, MD [address] [place],  [place] [address]  Urological history: 1.  History T1c intermediate risk prostate cancer -Gleason 3+4 diagnosed 2010 -Treated with brachytherapy 11/10/2008 -Last PSA 10/2015 <0.1   2.  Chronic bacteriuria -Cystoscopy Dr. Elnoria Howard 2015 unremarkable, nonocclusive prostate or stricture   3.  Stone disease - CT 2019 nonobstructing right mid ureteral calculus -Ureteroscopic stone removal 08/14/2016 -Left URS (10/19/2021)  -CT (12/2021) - Multiple nonobstructive renal calculi on the right measure up to 2.2 x 1.6 cm and are similar to 09/22/2021. No renal calculi on the left. No hydronephrosis on either side -RUS (04/2022) - no hydronephrosis    4.  Renal cysts -CTU 2019 bilateral renal cysts with 2 indeterminate right renal lesions (18 mm interpolar left kidney and 15 mm anterior right kidney) -MRI 07/2017 showed the left renal lesion was not present and was a perfusion anomaly; right renal lesion a Bosniak 2 cyst -CTU 2022 - There is 1.9 cm smooth marginated low-density lesion in the midportion of left kidney. There is 2.2 cm cyst in the anterior midportion of right kidney. There is 2.3 cm exophytic lesion in the anterior midportion of right kidney, possibly cyst  -CT renal stone study (12/2021) - Bilateral renal cysts measure up to 2.2 cm on the right and 2.4 cm on the left -RUS (04/2022) - 2 cysts are identified in the lower pole the right kidney with the largest measuring 2.6 cm - 2.6 cm cyst is identified the left kidney   HPI: [name] is a 82 y.o. male who presents today for follow up for renal stones.   RUS did not demonstrates any hydronephrosis.     PMH: Past Medical History:  Diagnosis Date   Anxiety    Atrophy, disuse, muscle    BPH (benign prostatic hyperplasia)    Cerebral palsy (HCC)    Eczema    Elevated lipids     Elevated PSA    Exotropia    History of kidney stones    Hyperlipidemia    Kidney stone    Leukopenia    Lower extremity edema    lymphedema   Mental retardation    Over weight    Prostate cancer ([facility])    Venous insufficiency    Vitamin D deficiency    Wheelchair dependence     Surgical History: Past Surgical History:  Procedure Laterality Date   COLONOSCOPY WITH PROPOFOL N/A 05/10/2020   Procedure: COLONOSCOPY WITH PROPOFOL;  Surgeon: Lin Landsman, MD;  Location: [facility] ENDOSCOPY;  Service: Gastroenterology;  Laterality: N/A;   COLONOSCOPY WITH PROPOFOL N/A 05/12/2020   Procedure: COLONOSCOPY WITH PROPOFOL;  Surgeon: Lin Landsman, MD;  Location: [facility] ENDOSCOPY;  Service: Gastroenterology;  Laterality: N/A;   CYSTOSCOPY W/ RETROGRADES Left 10/04/2021   Procedure: CYSTOSCOPY WITH RETROGRADE PYELOGRAM;  Surgeon: Abbie Sons, MD;  Location: [facility] ORS;  Service: Urology;  Laterality: Left;   CYSTOSCOPY/URETEROSCOPY/HOLMIUM LASER/STENT PLACEMENT Right 08/14/2017   Procedure: CYSTOSCOPY/URETEROSCOPY/HOLMIUM LASER/STENT PLACEMENT;  Surgeon: Abbie Sons, MD;  Location: [facility] ORS;  Service: Urology;  Laterality: Right;   CYSTOSCOPY/URETEROSCOPY/HOLMIUM LASER/STENT PLACEMENT Left 10/04/2021   Procedure: CYSTOSCOPY/URETEROSCOPY/HOLMIUM LASER/STENT PLACEMENT/ LITHOPAXY;  Surgeon: Abbie Sons, MD;  Location: [facility] ORS;  Service: Urology;  Laterality: Left;   ESOPHAGOGASTRODUODENOSCOPY (EGD) WITH PROPOFOL N/A 05/12/2020   Procedure: ESOPHAGOGASTRODUODENOSCOPY (EGD) WITH PROPOFOL;  Surgeon: Lin Landsman, MD;  Location: [facility];  Service: Gastroenterology;  Laterality: N/A;   INCISION AND DRAINAGE ABSCESS Left 07/05/2021   Procedure: INCISION AND DRAINAGE SCROTAL WALL ABSCESS;  Surgeon: Abbie Sons, MD;  Location: [facility] ORS;  Service: Urology;  Laterality: Left;   leg circulation surgery Right    PROSTATE SURGERY  11/10/2008   BRACHYTHERAPY   SCROTAL EXPLORATION  Left 07/05/2021   Procedure: SCROTUM EXPLORATION;  Surgeon: Abbie Sons, MD;  Location: [facility] ORS;  Service: Urology;  Laterality: Left;    Home Medications:  Allergies as of 05/23/2022   No Known Allergies      Medication List        Accurate as of May 22, 2022  1:16 PM. If you have any questions, ask your nurse or doctor.          acetaminophen 325 MG tablet Commonly known as: TYLENOL TAKE 2 TABLETS (650 MG TOTAL) BY MOUTH EVERY 6 HOURS AS NEEDED FOR MILD PAIN OR HEADACHE.   alfuzosin 10 MG 24 hr tablet Commonly known as: UROXATRAL TAKE 1 TABLET BY MOUTH DAILY   ALPRAZolam 0.25 MG tablet Commonly known as: XANAX Take 1 tablet (0.25 mg total) by mouth daily as needed (agitation (crisis med)).   Artificial Tears 0.2-0.2-1 % Soln Generic drug: Glycerin-Hypromellose-PEG 400 PLACE 2 DROPS INTO EYE 2 TIMES A DAY AS NEEDED   atorvastatin 40 MG tablet Commonly known as: LIPITOR TAKE 1 TABLET BY MOUTH EVERY EVENING   augmented betamethasone dipropionate 0.05 % cream Commonly known as: DIPROLENE-AF APPLY TOPICALLY AS DIRECTED TO RASH AS NEEDED   Calcium Carb-Cholecalciferol 500-10 MG-MCG Tabs TAKE 1 TABLET BY MOUTH TWICE DAILY.   ciprofloxacin 250 MG tablet Commonly known as: Cipro Take 1 tablet (250 mg total) by mouth 2 (two) times daily.   Cranberry Juice Powder 425 MG Caps TAKE 1 CAPSULE BY MOUTH ONCE DAILY   Depend Silhouette Briefs L/XL Misc 1 each by Does not apply route 2 (two) times daily as needed.   Ensure Original Liqd DRINK 1 BOTTLE BY MOUTH 3 TIMES DAILY   GNP Vitamin C 500 MG tablet Generic drug: ascorbic acid TAKE ONE TABLET BY MOUTH ONCE DAILY   Iron (Ferrous Sulfate) 325 (65 Fe) MG Tabs Take 325 mg by mouth daily.   lubiprostone 24 MCG capsule Commonly known as: AMITIZA TAKE 1 CAPSULE BY MOUTH 2 TIMES DAILY WITH A MEAL   Medical Compression Stockings Misc 1 Units by Does not apply route daily.   nystatin cream Commonly known  as: MYCOSTATIN APPLY 1 APPLICATION TOPICALLY 2 TIMES DAILY   sertraline 100 MG tablet Commonly known as: ZOLOFT TAKE 1 TABLET BY MOUTH DAILY   SYSTANE ULTRA OP Place 1 drop into both eyes 2 (two) times daily as needed (dry eyes).        Allergies: No Known Allergies  Family History: Family History  Problem Relation Age of Onset   Heart attack Brother    Heart disease Other     Social History:  reports that he has never smoked. He has never used smokeless tobacco. He reports that he does not drink alcohol and does not use drugs.  ROS: Pertinent ROS in HPI  Physical Exam: There were no vitals taken for this visit.  Constitutional:  Well nourished. Alert and oriented, No acute distress. HEENT: [place] AT, moist mucus membranes.  Trachea midline Cardiovascular: No clubbing, cyanosis, or edema. Respiratory: Normal respiratory effort, no increased work of breathing. GU: No CVA tenderness.  No bladder fullness or masses.  Patient  with circumcised/uncircumcised phallus. ***Foreskin easily retracted***  Urethral meatus is patent.  No penile discharge. No penile lesions or rashes. Scrotum without lesions, cysts, rashes and/or edema.  Testicles are located scrotally bilaterally. No masses are appreciated in the testicles. Left and right epididymis are normal. Rectal: Patient with  normal sphincter tone. Anus and perineum without scarring or rashes. No rectal masses are appreciated. Prostate is approximately *** grams, *** nodules are appreciated. Seminal vesicles are normal. Neurologic: Grossly intact, no focal deficits, moving all 4 extremities. Psychiatric: Normal mood and affect.   Laboratory Data:    Latest Ref Rng & Units 02/28/2022    3:56 PM 09/22/2021    5:31 PM 08/15/2021   10:01 AM  CMP  Glucose 65 - 99 mg/dL 87  91  102   BUN 7 - 25 mg/dL 35  26  32   Creatinine 0.70 - 1.28 mg/dL 0.64  0.60  0.74   Sodium 135 - 146 mmol/L 141  140  144   Potassium 3.5 - 5.3 mmol/L 4.3  4.2   3.8   Chloride 98 - 110 mmol/L 106  108  109   CO2 20 - 32 mmol/L '28  27  29   '$ Calcium 8.6 - 10.3 mg/dL 9.1  9.4  9.2   Total Protein 6.1 - 8.1 g/dL 6.7   6.1   Total Bilirubin 0.2 - 1.2 mg/dL 0.5   0.3   AST 10 - 35 U/L 16   15   ALT 9 - 46 U/L 17   14   I have reviewed the labs.    Pertinent Imaging: Narrative & Impression  CLINICAL DATA:  Kidney stone.   EXAM: RENAL / URINARY TRACT ULTRASOUND COMPLETE   COMPARISON:  CT of the abdomen and pelvis without contrast January 06, 2022   FINDINGS: Right Kidney:   Renal measurements: 10 x 6.8 x 5.7 cm = volume: 206 mL. Diffuse increased echogenicity. At least 2 cysts are identified in the lower pole the right kidney with the largest measuring 2.6 cm. No follow-up imaging recommended for the cysts.   Left Kidney:   Renal measurements: 10.9 x 6.5 x 5.4 cm = volume: 198.4 mL. Diffuse increased echogenicity. A 2.6 cm cyst is identified the left kidney. No follow-up imaging recommended for the cyst.   Bladder:   The bladder wall is thickened and trabecular limiting evaluation for small masses. The finding is similar in attention since the comparison CT.   Other:   None.   IMPRESSION: 1. The bladder wall is thickened and trabeculated limiting evaluation for small masses. The finding is similar in appearance to the comparison CT. 2. Medical renal disease with diffuse increased echogenicity in both kidneys.     Electronically Signed   By: Dorise Bullion III M.D.   On: 05/05/2022 07:36  I have independently reviewed the films.  See HPI.    Assessment & Plan:    1. Right UPJ stone -RUS did not demonstrate any hydronephrosis -serum creatinine is normal  -These notes generated with voice recognition software. I apologize for typographical errors.  [place], [place] [address]  [place], [place] [id number] [phone]

## 2022-05-23 ENCOUNTER — Ambulatory Visit: Payer: Medicare Other | Admitting: Urology

## 2022-05-23 DIAGNOSIS — N200 Calculus of kidney: Secondary | ICD-10-CM

## 2022-05-31 NOTE — Progress Notes (Unsigned)
06/01/2022 11:13 AM   [name] 11-14-1940 [id number]  Referring provider: Steele Sizer, MD [address] [place],  [place] [address]  Urological history: 1.  History T1c intermediate risk prostate cancer -Gleason 3+4 diagnosed 2010 -Treated with brachytherapy 11/10/2008 -Last PSA 10/2015 <0.1   2.  Chronic bacteriuria -Cystoscopy Dr. Elnoria Howard 2015 unremarkable, nonocclusive prostate or stricture   3.  Stone disease - CT 2019 nonobstructing right mid ureteral calculus -Ureteroscopic stone removal 08/14/2016 -Left URS (10/19/2021)  -CT (12/2021) - Multiple nonobstructive renal calculi on the right measure up to 2.2 x 1.6 cm and are similar to 09/22/2021. No renal calculi on the left. No hydronephrosis on either side -RUS (04/2022) - no hydronephrosis    4.  Renal cysts -CTU 2019 bilateral renal cysts with 2 indeterminate right renal lesions (18 mm interpolar left kidney and 15 mm anterior right kidney) -MRI 07/2017 showed the left renal lesion was not present and was a perfusion anomaly; right renal lesion a Bosniak 2 cyst -CTU 2022 - There is 1.9 cm smooth marginated low-density lesion in the midportion of left kidney. There is 2.2 cm cyst in the anterior midportion of right kidney. There is 2.3 cm exophytic lesion in the anterior midportion of right kidney, possibly cyst  -CT renal stone study (12/2021) - Bilateral renal cysts measure up to 2.2 cm on the right and 2.4 cm on the left -RUS (04/2022) - 2 cysts are identified in the lower pole the right kidney with the largest measuring 2.6 cm - 2.6 cm cyst is identified the left kidney   HPI: [name] is a 82 y.o. male who presents today for follow up for renal stones.   History given by staff.   RUS did not demonstrates any hydronephrosis.    He has not complained of any renal colic or had any passage of fragments.  There have been no signs of infection or complaints of scrotal pain.    Patient  denies any modifying or aggravating factors.  Patient denies any gross hematuria, dysuria or suprapubic/flank pain.  Patient denies any fevers, chills, nausea or vomiting.     PMH: Past Medical History:  Diagnosis Date   Anxiety    Atrophy, disuse, muscle    BPH (benign prostatic hyperplasia)    Cerebral palsy (HCC)    Eczema    Elevated lipids    Elevated PSA    Exotropia    History of kidney stones    Hyperlipidemia    Kidney stone    Leukopenia    Lower extremity edema    lymphedema   Mental retardation    Over weight    Prostate cancer ([place])    Venous insufficiency    Vitamin D deficiency    Wheelchair dependence     Surgical History: Past Surgical History:  Procedure Laterality Date   COLONOSCOPY WITH PROPOFOL N/A 05/10/2020   Procedure: COLONOSCOPY WITH PROPOFOL;  Surgeon: Lin Landsman, MD;  Location: [facility] ENDOSCOPY;  Service: Gastroenterology;  Laterality: N/A;   COLONOSCOPY WITH PROPOFOL N/A 05/12/2020   Procedure: COLONOSCOPY WITH PROPOFOL;  Surgeon: Lin Landsman, MD;  Location: [facility] ENDOSCOPY;  Service: Gastroenterology;  Laterality: N/A;   CYSTOSCOPY W/ RETROGRADES Left 10/04/2021   Procedure: CYSTOSCOPY WITH RETROGRADE PYELOGRAM;  Surgeon: Abbie Sons, MD;  Location: [facility] ORS;  Service: Urology;  Laterality: Left;   CYSTOSCOPY/URETEROSCOPY/HOLMIUM LASER/STENT PLACEMENT Right 08/14/2017   Procedure: CYSTOSCOPY/URETEROSCOPY/HOLMIUM LASER/STENT PLACEMENT;  Surgeon: Abbie Sons, MD;  Location: [facility] ORS;  Service: Urology;  Laterality: Right;   CYSTOSCOPY/URETEROSCOPY/HOLMIUM LASER/STENT PLACEMENT Left 10/04/2021   Procedure: CYSTOSCOPY/URETEROSCOPY/HOLMIUM LASER/STENT PLACEMENT/ LITHOPAXY;  Surgeon: Abbie Sons, MD;  Location: [facility] ORS;  Service: Urology;  Laterality: Left;   ESOPHAGOGASTRODUODENOSCOPY (EGD) WITH PROPOFOL N/A 05/12/2020   Procedure: ESOPHAGOGASTRODUODENOSCOPY (EGD) WITH PROPOFOL;  Surgeon: Lin Landsman, MD;  Location:  [facility] ENDOSCOPY;  Service: Gastroenterology;  Laterality: N/A;   INCISION AND DRAINAGE ABSCESS Left 07/05/2021   Procedure: INCISION AND DRAINAGE SCROTAL WALL ABSCESS;  Surgeon: Abbie Sons, MD;  Location: [facility] ORS;  Service: Urology;  Laterality: Left;   leg circulation surgery Right    PROSTATE SURGERY  11/10/2008   BRACHYTHERAPY   SCROTAL EXPLORATION Left 07/05/2021   Procedure: SCROTUM EXPLORATION;  Surgeon: Abbie Sons, MD;  Location: [facility] ORS;  Service: Urology;  Laterality: Left;    Home Medications:  Allergies as of 06/01/2022   No Known Allergies      Medication List        Accurate as of June 01, 2022 11:13 AM. If you have any questions, ask your nurse or doctor.          acetaminophen 325 MG tablet Commonly known as: TYLENOL TAKE 2 TABLETS (650 MG TOTAL) BY MOUTH EVERY 6 HOURS AS NEEDED FOR MILD PAIN OR HEADACHE.   alfuzosin 10 MG 24 hr tablet Commonly known as: UROXATRAL TAKE 1 TABLET BY MOUTH DAILY   ALPRAZolam 0.25 MG tablet Commonly known as: XANAX Take 1 tablet (0.25 mg total) by mouth daily as needed (agitation (crisis med)).   Artificial Tears 0.2-0.2-1 % Soln Generic drug: Glycerin-Hypromellose-PEG 400 PLACE 2 DROPS INTO EYE 2 TIMES A DAY AS NEEDED   atorvastatin 40 MG tablet Commonly known as: LIPITOR TAKE 1 TABLET BY MOUTH EVERY EVENING   augmented betamethasone dipropionate 0.05 % cream Commonly known as: DIPROLENE-AF APPLY TOPICALLY AS DIRECTED TO RASH AS NEEDED   Calcium Carb-Cholecalciferol 500-10 MG-MCG Tabs TAKE 1 TABLET BY MOUTH TWICE DAILY.   ciprofloxacin 250 MG tablet Commonly known as: Cipro Take 1 tablet (250 mg total) by mouth 2 (two) times daily.   Cranberry Juice Powder 425 MG Caps TAKE 1 CAPSULE BY MOUTH ONCE DAILY   Depend Silhouette Briefs L/XL Misc 1 each by Does not apply route 2 (two) times daily as needed.   Ensure Original Liqd DRINK 1 BOTTLE BY MOUTH 3 TIMES DAILY   GNP Vitamin C 500 MG  tablet Generic drug: ascorbic acid TAKE ONE TABLET BY MOUTH ONCE DAILY   Iron (Ferrous Sulfate) 325 (65 Fe) MG Tabs Take 325 mg by mouth daily.   lubiprostone 24 MCG capsule Commonly known as: AMITIZA TAKE 1 CAPSULE BY MOUTH 2 TIMES DAILY WITH A MEAL   Medical Compression Stockings Misc 1 Units by Does not apply route daily.   nystatin cream Commonly known as: MYCOSTATIN APPLY 1 APPLICATION TOPICALLY 2 TIMES DAILY   sertraline 100 MG tablet Commonly known as: ZOLOFT TAKE 1 TABLET BY MOUTH DAILY   SYSTANE ULTRA OP Place 1 drop into both eyes 2 (two) times daily as needed (dry eyes).        Allergies: No Known Allergies  Family History: Family History  Problem Relation Age of Onset   Heart attack Brother    Heart disease Other     Social History:  reports that he has never smoked. He has never used smokeless tobacco. He reports that he does not drink alcohol and does not use drugs.  ROS: Pertinent ROS in HPI  Physical Exam: BP 105/67   Pulse (!) 55   Constitutional:  Well nourished. Alert and oriented, No acute distress. HEENT: [place] AT, moist mucus membranes.  Trachea midline Cardiovascular: No clubbing, cyanosis, or edema. Respiratory: Normal respiratory effort, no increased work of breathing. Neurologic: Grossly intact, no focal deficits, moving all 4 extremities.  In wheel chair.  Leg contracted.  Psychiatric: Normal mood and affect.   Laboratory Data:    Latest Ref Rng & Units 02/28/2022    3:56 PM 09/22/2021    5:31 PM 08/15/2021   10:01 AM  CMP  Glucose 65 - 99 mg/dL 87  91  102   BUN 7 - 25 mg/dL 35  26  32   Creatinine 0.70 - 1.28 mg/dL 0.64  0.60  0.74   Sodium 135 - 146 mmol/L 141  140  144   Potassium 3.5 - 5.3 mmol/L 4.3  4.2  3.8   Chloride 98 - 110 mmol/L 106  108  109   CO2 20 - 32 mmol/L '28  27  29   '$ Calcium 8.6 - 10.3 mg/dL 9.1  9.4  9.2   Total Protein 6.1 - 8.1 g/dL 6.7   6.1   Total Bilirubin 0.2 - 1.2 mg/dL 0.5   0.3   AST 10 - 35 U/L  16   15   ALT 9 - 46 U/L 17   14   I have reviewed the labs.    Pertinent Imaging: Narrative & Impression  CLINICAL DATA:  Kidney stone.   EXAM: RENAL / URINARY TRACT ULTRASOUND COMPLETE   COMPARISON:  CT of the abdomen and pelvis without contrast January 06, 2022   FINDINGS: Right Kidney:   Renal measurements: 10 x 6.8 x 5.7 cm = volume: 206 mL. Diffuse increased echogenicity. At least 2 cysts are identified in the lower pole the right kidney with the largest measuring 2.6 cm. No follow-up imaging recommended for the cysts.   Left Kidney:   Renal measurements: 10.9 x 6.5 x 5.4 cm = volume: 198.4 mL. Diffuse increased echogenicity. A 2.6 cm cyst is identified the left kidney. No follow-up imaging recommended for the cyst.   Bladder:   The bladder wall is thickened and trabecular limiting evaluation for small masses. The finding is similar in attention since the comparison CT.   Other:   None.   IMPRESSION: 1. The bladder wall is thickened and trabeculated limiting evaluation for small masses. The finding is similar in appearance to the comparison CT. 2. Medical renal disease with diffuse increased echogenicity in both kidneys.     Electronically Signed   By: Dorise Bullion III M.D.   On: 05/05/2022 07:36  I have independently reviewed the films.  See HPI.    Assessment & Plan:    1. Right UPJ stone -RUS did not demonstrate any hydronephrosis -serum creatinine is normal -will continue surveillance -RTC in 6 months for repeat RUS   -These notes generated with voice recognition software. I apologize for typographical errors.  [place], [place] [address]  [place],  [address] [phone]

## 2022-06-01 ENCOUNTER — Ambulatory Visit (INDEPENDENT_AMBULATORY_CARE_PROVIDER_SITE_OTHER): Payer: 59 | Admitting: Urology

## 2022-06-01 ENCOUNTER — Encounter: Payer: Self-pay | Admitting: Urology

## 2022-06-01 VITALS — BP 105/67 | HR 55

## 2022-06-01 DIAGNOSIS — N201 Calculus of ureter: Secondary | ICD-10-CM | POA: Diagnosis not present

## 2022-06-01 DIAGNOSIS — N200 Calculus of kidney: Secondary | ICD-10-CM

## 2022-07-10 ENCOUNTER — Encounter: Payer: Self-pay | Admitting: Physician Assistant

## 2022-07-10 ENCOUNTER — Ambulatory Visit (INDEPENDENT_AMBULATORY_CARE_PROVIDER_SITE_OTHER): Payer: 59 | Admitting: Physician Assistant

## 2022-07-10 ENCOUNTER — Other Ambulatory Visit: Payer: Self-pay

## 2022-07-10 VITALS — BP 124/68 | HR 67 | Temp 97.9°F | Resp 16

## 2022-07-10 DIAGNOSIS — Z025 Encounter for examination for participation in sport: Secondary | ICD-10-CM

## 2022-07-10 NOTE — Progress Notes (Deleted)
          Acute Office Visit   Patient: [name]   DOB: [birth year]   82 y.o. Male  MRN: [id number] Visit Date: 07/10/2022  Today's healthcare provider: Dani Gobble Jerime Arif, PA-C  Introduced myself to the patient as a Journalist, newspaper and provided education on APPs in clinical practice.    No chief complaint on file.  Subjective    HPI    Medications: Outpatient Medications Prior to Visit  Medication Sig   acetaminophen (TYLENOL) 325 MG tablet TAKE 2 TABLETS (650 MG TOTAL) BY MOUTH EVERY 6 HOURS AS NEEDED FOR MILD PAIN OR HEADACHE.   alfuzosin (UROXATRAL) 10 MG 24 hr tablet TAKE 1 TABLET BY MOUTH DAILY   ALPRAZolam (XANAX) 0.25 MG tablet Take 1 tablet (0.25 mg total) by mouth daily as needed (agitation (crisis med)).   ARTIFICIAL TEARS 0.2-0.2-1 % SOLN PLACE 2 DROPS INTO EYE 2 TIMES A DAY AS NEEDED   atorvastatin (LIPITOR) 40 MG tablet TAKE 1 TABLET BY MOUTH EVERY EVENING   augmented betamethasone dipropionate (DIPROLENE-AF) 0.05 % cream APPLY TOPICALLY AS DIRECTED TO RASH AS NEEDED   Calcium Carb-Cholecalciferol 500-10 MG-MCG TABS TAKE 1 TABLET BY MOUTH TWICE DAILY.   ciprofloxacin (CIPRO) 250 MG tablet Take 1 tablet (250 mg total) by mouth 2 (two) times daily.   Cranberry Juice Powder 425 MG CAPS TAKE 1 CAPSULE BY MOUTH ONCE DAILY   Elastic Bandages & Supports (MEDICAL COMPRESSION STOCKINGS) MISC 1 Units by Does not apply route daily.   GNP VITAMIN C 500 MG tablet TAKE ONE TABLET BY MOUTH ONCE DAILY   Incontinence Supply Disposable (DEPEND SILHOUETTE BRIEFS L/XL) MISC 1 each by Does not apply route 2 (two) times daily as needed.   Iron, Ferrous Sulfate, 325 (65 Fe) MG TABS Take 325 mg by mouth daily.   lubiprostone (AMITIZA) 24 MCG capsule TAKE 1 CAPSULE BY MOUTH 2 TIMES DAILY WITH A MEAL   Nutritional Supplements (ENSURE ORIGINAL) LIQD DRINK 1 BOTTLE BY MOUTH 3 TIMES DAILY   nystatin cream (MYCOSTATIN) APPLY 1 APPLICATION TOPICALLY 2 TIMES DAILY   Polyethyl Glycol-Propyl Glycol (SYSTANE  ULTRA OP) Place 1 drop into both eyes 2 (two) times daily as needed (dry eyes).   sertraline (ZOLOFT) 100 MG tablet TAKE 1 TABLET BY MOUTH DAILY   No facility-administered medications prior to visit.    Review of Systems  {Labs  Heme  Chem  Endocrine  Serology  Results Review (optional):23779}   Objective    There were no vitals taken for this visit. {Show previous vital signs (optional):23777}  Physical Exam    No results found for any visits on 07/10/22.  Assessment & Plan      No follow-ups on file.

## 2022-07-10 NOTE — Progress Notes (Signed)
Patient is here with guardian to complete medical clearance forms to participate in [place] reports that he will be competing in ball toss and pushing activities that can be accomplished from his wheelchair   Please see scanned paperwork for details of exam

## 2022-07-14 DIAGNOSIS — D101 Benign neoplasm of tongue: Secondary | ICD-10-CM | POA: Diagnosis not present

## 2022-07-14 DIAGNOSIS — H6123 Impacted cerumen, bilateral: Secondary | ICD-10-CM | POA: Diagnosis not present

## 2022-07-19 ENCOUNTER — Other Ambulatory Visit: Payer: Self-pay | Admitting: Family Medicine

## 2022-07-19 DIAGNOSIS — E441 Mild protein-calorie malnutrition: Secondary | ICD-10-CM

## 2022-07-19 DIAGNOSIS — I700 Atherosclerosis of aorta: Secondary | ICD-10-CM

## 2022-07-19 DIAGNOSIS — F320 Major depressive disorder, single episode, mild: Secondary | ICD-10-CM

## 2022-07-19 DIAGNOSIS — K5909 Other constipation: Secondary | ICD-10-CM

## 2022-07-19 DIAGNOSIS — N390 Urinary tract infection, site not specified: Secondary | ICD-10-CM

## 2022-07-19 DIAGNOSIS — R350 Frequency of micturition: Secondary | ICD-10-CM

## 2022-08-16 NOTE — Progress Notes (Unsigned)
Name: [name]   MRN: [id number]    DOB: 19-Jun-1940   Date:08/17/2022       Progress Note  Subjective  Chief Complaint  Chief Complaint  Patient presents with   Annual Exam    HPI  Patient presents for annual CPE . Cerebral Palsy/ history of polio : he lives in a group home, he is wheelchair bound, unable to transfer, he also has some atrophy of his hands. He is able to eat without assistance. He needs help bathing, transferring, transportation and taking his medications.  He has urine and bowel incontinence.     Diet: well balanced diet Exercise: chair bound Sleep: he says he sleeps okay  Depression: phq 9 is negative    08/17/2022    9:11 AM 07/10/2022   10:50 AM 02/28/2022    3:25 PM 02/01/2022    2:14 PM 10/14/2021    2:12 PM  Depression screen PHQ 2/9  Decreased Interest 0 0 0 0 0  Down, Depressed, Hopeless 0 0 0 0 0  PHQ - 2 Score 0 0 0 0 0  Altered sleeping   0 0   Tired, decreased energy   0 0   Change in appetite   0 0   Feeling bad or failure about yourself    0 0   Trouble concentrating   0 0   Moving slowly or fidgety/restless   0 0   Suicidal thoughts   0 0   PHQ-9 Score   0 0   Difficult doing work/chores   Not difficult at all      Hypertension:  BP Readings from Last 3 Encounters:  08/17/22 128/82  07/10/22 124/68  06/01/22 105/67    Obesity: Wt Readings from Last 3 Encounters:  02/01/22 145 lb 6.4 oz (66 kg)  10/19/21 130 lb (59 kg)  10/04/21 140 lb (63.5 kg)   BMI Readings from Last 3 Encounters:  02/28/22 19.18 kg/m  02/01/22 19.18 kg/m  01/11/22 17.15 kg/m     Lipids:  Lab Results  Component Value Date   CHOL 94 08/15/2021   CHOL 78 03/01/2020   CHOL 72 02/27/2019   Lab Results  Component Value Date   HDL 47 08/15/2021   HDL 40 03/01/2020   HDL 39 (L) 02/27/2019   Lab Results  Component Value Date   LDLCALC 34 08/15/2021   LDLCALC 27 03/01/2020   LDLCALC 19 02/27/2019   Lab Results  Component Value Date    TRIG 53 08/15/2021   TRIG 37 03/01/2020   TRIG 49 02/27/2019   Lab Results  Component Value Date   CHOLHDL 2.0 08/15/2021   CHOLHDL 2.0 03/01/2020   CHOLHDL 1.8 02/27/2019   No results found for: "LDLDIRECT" Glucose:  Glucose  Date Value Ref Range Status  08/14/2013 99 65 - 99 mg/dL Final  40/98/1191 478 (H) 65 - 99 mg/dL Final   Glucose, Bld  Date Value Ref Range Status  02/28/2022 87 65 - 99 mg/dL Final    Comment:    .            Fasting reference interval .   09/22/2021 91 70 - 99 mg/dL Final    Comment:    Glucose reference range applies only to samples taken after fasting for at least 8 hours.  08/15/2021 102 (H) 65 - 99 mg/dL Final    Comment:    .            Fasting reference  interval . For someone without known diabetes, a glucose value between 100 and 125 mg/dL is consistent with prediabetes and should be confirmed with a follow-up test. .    Glucose-Capillary  Date Value Ref Range Status  04/20/2020 126 (H) 70 - 99 mg/dL Final    Comment:    Glucose reference range applies only to samples taken after fasting for at least 8 hours.    Flowsheet Row Office Visit from 08/17/2022 in [facility]  AUDIT-C Score 0        Single STD testing and prevention (HIV/chl/gon/syphilis): none Hep C: 06/21/2018  Skin cancer: Discussed monitoring for atypical lesions Colorectal cancer:05/10/2020  Prostate cancer: under care of urologist No results found for: "PSA"   Lung cancer:   Low Dose CT Chest recommended if Age 43-80 years, 30 pack-year currently smoking OR have quit w/in 15years. Patient does not qualify.   AAA:  The USPSTF recommends one-time screening with ultrasonography in men ages 74 to 53 years who have ever smoked ECG:  09/22/2021  Vaccines:  HPV: up to at age 69 , ask insurance if age between 52-45  Shingrix: 8-64 yo and ask insurance if covered when patient above 45 yo Pneumonia:  educated and discussed with  patient. Flu:  educated and discussed with patient.  Advanced Care Planning: A voluntary discussion about advance care planning including the explanation and discussion of advance directives.  Discussed health care proxy and Living will, and the patient was able to identify a health care proxy as [name].  Patient does have a living will at present time. If patient does have living will, I have requested they bring this to the clinic to be scanned in to their chart.  Patient Active Problem List   Diagnosis Date Noted   Nephrolithiasis 02/01/2022   Pressure injury of left heel, stage 2 02/01/2022   Atherosclerosis of aorta 02/01/2022   History of iron deficiency anemia 02/01/2022   Urinary incontinence 05/26/2021   Hypotension 05/20/2021   History of rectal bleeding 05/09/2020   Hematochezia    BPH (benign prostatic hyperplasia)    Hyperlipidemia    Depression with anxiety    Personal history of urinary calculi 06/21/2019   PAD (peripheral artery disease) 07/04/2017   Aortic valve calcification 07/04/2017   Coronary artery calcification 07/04/2017   Late effect acute polio 05/17/2016   Chronic constipation 11/19/2015   Mild major depression 11/19/2015   Bacteriuria, chronic 09/11/2015   History of prostate cancer 03/02/2015   Amyotrophia 03/02/2015   Edema leg 03/02/2015   Cerebral palsy 03/02/2015   Dyslipidemia 03/02/2015   Dermatitis, eczematoid 03/02/2015   Divergent squint 03/02/2015   H/O acute poliomyelitis 03/02/2015   Lymphedema 03/02/2015   Intellectual disability 03/02/2015   Hypertrophy of nail 03/02/2015   Chronic venous insufficiency 03/02/2015   Avitaminosis D 03/02/2015   Adynamia 03/02/2015   Dependent on wheelchair 03/02/2015   Renal cysts, acquired, bilateral 03/02/2015   At risk for falling 03/02/2015   Cerebral vascular disease 03/02/2015   Strabismus 02/11/2015    Past Surgical History:  Procedure Laterality Date   COLONOSCOPY WITH  PROPOFOL N/A 05/10/2020   Procedure: COLONOSCOPY WITH PROPOFOL;  Surgeon: Toney Reil, MD;  Location: [facility] ENDOSCOPY;  Service: Gastroenterology;  Laterality: N/A;   COLONOSCOPY WITH PROPOFOL N/A 05/12/2020   Procedure: COLONOSCOPY WITH PROPOFOL;  Surgeon: Toney Reil, MD;  Location: [facility] ENDOSCOPY;  Service: Gastroenterology;  Laterality: N/A;   CYSTOSCOPY W/ RETROGRADES Left 10/04/2021  Procedure: CYSTOSCOPY WITH RETROGRADE PYELOGRAM;  Surgeon: Riki Altes, MD;  Location: [facility] ORS;  Service: Urology;  Laterality: Left;   CYSTOSCOPY/URETEROSCOPY/HOLMIUM LASER/STENT PLACEMENT Right 08/14/2017   Procedure: CYSTOSCOPY/URETEROSCOPY/HOLMIUM LASER/STENT PLACEMENT;  Surgeon: Riki Altes, MD;  Location: [facility] ORS;  Service: Urology;  Laterality: Right;   CYSTOSCOPY/URETEROSCOPY/HOLMIUM LASER/STENT PLACEMENT Left 10/04/2021   Procedure: CYSTOSCOPY/URETEROSCOPY/HOLMIUM LASER/STENT PLACEMENT/ LITHOPAXY;  Surgeon: Riki Altes, MD;  Location: [facility] ORS;  Service: Urology;  Laterality: Left;   ESOPHAGOGASTRODUODENOSCOPY (EGD) WITH PROPOFOL N/A 05/12/2020   Procedure: ESOPHAGOGASTRODUODENOSCOPY (EGD) WITH PROPOFOL;  Surgeon: Toney Reil, MD;  Location: [facility] ENDOSCOPY;  Service: Gastroenterology;  Laterality: N/A;   INCISION AND DRAINAGE ABSCESS Left 07/05/2021   Procedure: INCISION AND DRAINAGE SCROTAL WALL ABSCESS;  Surgeon: Riki Altes, MD;  Location: [facility] ORS;  Service: Urology;  Laterality: Left;   leg circulation surgery Right    PROSTATE SURGERY  11/10/2008   BRACHYTHERAPY   SCROTAL EXPLORATION Left 07/05/2021   Procedure: SCROTUM EXPLORATION;  Surgeon: Riki Altes, MD;  Location: [facility] ORS;  Service: Urology;  Laterality: Left;    Family History  Problem Relation Age of Onset   Heart attack Brother    Heart disease Other     Social History   Socioeconomic History   Marital status: Single    Spouse name: Not on file   Number of children: Not on file    Years of education: Not on file   Highest education level: Not on file  Occupational History   Not on file  Tobacco Use   Smoking status: Never   Smokeless tobacco: Never  Vaping Use   Vaping Use: Never used  Substance and Sexual Activity   Alcohol use: No    Alcohol/week: 0.0 standard drinks of alcohol   Drug use: No   Sexual activity: Not Currently  Other Topics Concern   Not on file  Social History Narrative   Lives at [facility] group home, 5 males. Moved to higher level of care 04/28/19 with Anselm Pancoast.     Social Determinants of Health   Financial Resource Strain: Low Risk  (09/15/2021)   Overall Financial Resource Strain (CARDIA)    Difficulty of Paying Living Expenses: Not hard at all  Food Insecurity: No Food Insecurity (09/15/2021)   Hunger Vital Sign    Worried About Running Out of Food in the Last Year: Never true    Ran Out of Food in the Last Year: Never true  Transportation Needs: No Transportation Needs (09/15/2021)   PRAPARE - Administrator, Civil Service (Medical): No    Lack of Transportation (Non-Medical): No  Physical Activity: Inactive (09/15/2021)   Exercise Vital Sign    Days of Exercise per Week: 0 days    Minutes of Exercise per Session: 0 min  Stress: No Stress Concern Present (09/15/2021)   [employer] of Occupational Health - Occupational Stress Questionnaire    Feeling of Stress : Not at all  Social Connections: Socially Isolated (09/15/2021)   Social Connection and Isolation Panel [NHANES]    Frequency of Communication with Friends and Family: Never    Frequency of Social Gatherings with Friends and Family: Never    Attends Religious Services: Never    Database administrator or Organizations: No    Attends Banker Meetings: Never    Marital Status: Never married  Intimate Partner Violence: Not At Risk (09/15/2021)   Humiliation, Afraid, Rape, and Kick questionnaire  Fear of Current or Ex-Partner: No     Emotionally Abused: No    Physically Abused: No    Sexually Abused: No     Current Outpatient Medications:    acetaminophen (TYLENOL) 325 MG tablet, TAKE 2 TABLETS (650 MG TOTAL) BY MOUTH EVERY 6 HOURS AS NEEDED FOR MILD PAIN OR HEADACHE., Disp: 60 tablet, Rfl: 5   alfuzosin (UROXATRAL) 10 MG 24 hr tablet, TAKE 1 TABLET BY MOUTH DAILY, Disp: 90 tablet, Rfl: 1   ALPRAZolam (XANAX) 0.25 MG tablet, Take 1 tablet (0.25 mg total) by mouth daily as needed (agitation (crisis med))., Disp: 20 tablet, Rfl: 0   ARTIFICIAL TEARS 0.2-0.2-1 % SOLN, PLACE 2 DROPS INTO EYE 2 TIMES A DAY AS NEEDED, Disp: 15 mL, Rfl: 2   atorvastatin (LIPITOR) 40 MG tablet, TAKE 1 TABLET BY MOUTH EVERY EVENING, Disp: 90 tablet, Rfl: 1   augmented betamethasone dipropionate (DIPROLENE-AF) 0.05 % cream, APPLY TOPICALLY AS DIRECTED TO RASH AS NEEDED, Disp: 45 g, Rfl: 0   Calcium Carb-Cholecalciferol 500-10 MG-MCG TABS, TAKE 1 TABLET BY MOUTH TWICE DAILY., Disp: 180 tablet, Rfl: 0   ciprofloxacin (CIPRO) 250 MG tablet, Take 1 tablet (250 mg total) by mouth 2 (two) times daily., Disp: 14 tablet, Rfl: 0   Cranberry Juice Powder 425 MG CAPS, TAKE 1 CAPSULE BY MOUTH ONCE DAILY, Disp: 90 capsule, Rfl: 1   Elastic Bandages & Supports (MEDICAL COMPRESSION STOCKINGS) MISC, 1 Units by Does not apply route daily., Disp: 2 each, Rfl: 0   GNP VITAMIN C 500 MG tablet, TAKE ONE TABLET BY MOUTH ONCE DAILY, Disp: 100 tablet, Rfl: 1   Incontinence Supply Disposable (DEPEND SILHOUETTE BRIEFS L/XL) MISC, 1 each by Does not apply route 2 (two) times daily as needed., Disp: 60 each, Rfl: 5   Iron, Ferrous Sulfate, 325 (65 Fe) MG TABS, Take 325 mg by mouth daily., Disp: 30 tablet, Rfl: 3   lubiprostone (AMITIZA) 24 MCG capsule, TAKE 1 CAPSULE BY MOUTH 2 TIMES DAILY WITH A MEAL, Disp: 60 capsule, Rfl: 5   Nutritional Supplements (ENSURE ORIGINAL) LIQD, DRINK 1 BOTTLE BY MOUTH 3 TIMES DAILY, Disp: 5.688 mL, Rfl: 0   nystatin cream (MYCOSTATIN), APPLY 1  APPLICATION TOPICALLY 2 TIMES DAILY, Disp: 30 g, Rfl: 0   Polyethyl Glycol-Propyl Glycol (SYSTANE ULTRA OP), Place 1 drop into both eyes 2 (two) times daily as needed (dry eyes)., Disp: , Rfl:    sertraline (ZOLOFT) 100 MG tablet, TAKE 1 TABLET BY MOUTH DAILY, Disp: 90 tablet, Rfl: 1  No Known Allergies   ROS  Constitutional: Negative for fever or weight change.  Respiratory: Negative for cough and shortness of breath.   Cardiovascular: Negative for chest pain or palpitations.  Gastrointestinal: Negative for abdominal pain, no bowel changes.  Musculoskeletal: Negative for gait problem or joint swelling.  Skin: Negative for rash.  Neurological: Negative for dizziness or headache.  No other specific complaints in a complete review of systems (except as listed in HPI above).    Objective  Vitals:   08/17/22 0911  BP: 128/82  Pulse: 67  Resp: 18  Temp: 98.1 F (36.7 C)  TempSrc: Oral  SpO2: 94%    There is no height or weight on file to calculate BMI.  Physical Exam  Constitutional: Patient appears well-developed and well-nourished. No distress.  HENT: Head: Normocephalic and atraumatic. Ears: B TMs ok, no erythema or effusion; Nose: Nose normal. Mouth/Throat: Oropharynx is clear and moist. No oropharyngeal exudate.  Eyes: Conjunctivae and  EOM are normal. Pupils are equal, round, and reactive to light. No scleral icterus.  Neck: Normal range of motion. Neck supple. No JVD present. No thyromegaly present.  Cardiovascular: Normal rate, regular rhythm and normal heart sounds.  No murmur heard. BLE edema ankles Pulmonary/Chest: Effort normal and breath sounds normal. No respiratory distress. Abdominal: Soft. Bowel sounds are normal, no distension. There is no tenderness. no masses Musculoskeletal: Normal range of motion, no joint effusions. No gross deformities Neurological: he is alert and oriented to person, place, and time. No cranial nerve deficit. Strabismus, atrophy of hands  and paraplegia lower extremities Skin: Skin is warm and dry. No rash noted. No erythema.  Psychiatric: Patient has a normal mood and affect. behavior is normal. Judgment and thought content normal.   Hearing Screening   500Hz  1000Hz  2000Hz  4000Hz   Right ear Pass Pass Pass Pass  Left ear Pass Pass Pass Pass    No results found for this or any previous visit (from the past 2160 hour(s)).   Fall Risk:    08/17/2022    9:10 AM 07/10/2022   10:50 AM 02/28/2022    3:18 PM 02/01/2022    2:14 PM 10/14/2021    2:12 PM  Fall Risk   Falls in the past year? 0 0 0 1 0  Number falls in past yr: 0 0 0 0 0  Injury with Fall? 0 0 0 0 0  Risk for fall due to :    Impaired balance/gait;Impaired mobility Impaired balance/gait;Impaired mobility  Follow up    Falls prevention discussed;Education provided;Falls evaluation completed Falls prevention discussed      Functional Status Survey: Is the patient deaf or have difficulty hearing?: No Does the patient have difficulty seeing, even when wearing glasses/contacts?: No Does the patient have difficulty concentrating, remembering, or making decisions?: Yes Does the patient have difficulty walking or climbing stairs?: Yes Does the patient have difficulty dressing or bathing?: Yes Does the patient have difficulty doing errands alone such as visiting a doctor's office or shopping?: Yes    Assessment & Plan  1. Annual physical exam  - CBC with Differential/Platelet - COMPLETE METABOLIC PANEL WITH GFR - Lipid panel - Iron, TIBC and Ferritin Panel - B12 and Folate Panel - VITAMIN D 25 Hydroxy (Vit-D Deficiency, Fractures)  2. Low folic acid  - Z61 and Folate Panel  3. Iron deficiency anemia, unspecified iron deficiency anemia type  - CBC with Differential/Platelet - Iron, TIBC and Ferritin Panel  4. Hyperlipidemia, unspecified hyperlipidemia type  - COMPLETE METABOLIC PANEL WITH GFR - Lipid panel  5. Vitamin D deficiency  - VITAMIN D  25 Hydroxy (Vit-D Deficiency, Fractures)    -Prostate cancer screening and PSA options (with potential risks and benefits of testing vs not testing) were discussed along with recent recs/guidelines. -USPSTF grade A and B recommendations reviewed with patient; age-appropriate recommendations, preventive care, screening tests, etc discussed and encouraged; healthy living encouraged; see AVS for patient education given to patient -Discussed importance of 150 minutes of physical activity weekly, eat two servings of fish weekly, eat one serving of tree nuts ( cashews, pistachios, pecans, almonds.Marland Kitchen) every other day, eat 6 servings of fruit/vegetables daily and drink plenty of water and avoid sweet beverages.

## 2022-08-17 ENCOUNTER — Other Ambulatory Visit: Payer: Self-pay

## 2022-08-17 ENCOUNTER — Encounter: Payer: Self-pay | Admitting: Nurse Practitioner

## 2022-08-17 ENCOUNTER — Ambulatory Visit (INDEPENDENT_AMBULATORY_CARE_PROVIDER_SITE_OTHER): Payer: 59 | Admitting: Nurse Practitioner

## 2022-08-17 VITALS — BP 128/82 | HR 67 | Temp 98.1°F | Resp 18

## 2022-08-17 DIAGNOSIS — E559 Vitamin D deficiency, unspecified: Secondary | ICD-10-CM | POA: Diagnosis not present

## 2022-08-17 DIAGNOSIS — D509 Iron deficiency anemia, unspecified: Secondary | ICD-10-CM

## 2022-08-17 DIAGNOSIS — E538 Deficiency of other specified B group vitamins: Secondary | ICD-10-CM | POA: Diagnosis not present

## 2022-08-17 DIAGNOSIS — E785 Hyperlipidemia, unspecified: Secondary | ICD-10-CM | POA: Diagnosis not present

## 2022-08-17 DIAGNOSIS — Z0000 Encounter for general adult medical examination without abnormal findings: Secondary | ICD-10-CM | POA: Diagnosis not present

## 2022-08-18 LAB — COMPLETE METABOLIC PANEL WITH GFR
AG Ratio: 1.5 (calc) (ref 1.0–2.5)
ALT: 17 U/L (ref 9–46)
AST: 17 U/L (ref 10–35)
Albumin: 3.8 g/dL (ref 3.6–5.1)
Alkaline phosphatase (APISO): 131 U/L (ref 35–144)
BUN/Creatinine Ratio: 40 (calc) — ABNORMAL HIGH (ref 6–22)
BUN: 31 mg/dL — ABNORMAL HIGH (ref 7–25)
CO2: 30 mmol/L (ref 20–32)
Calcium: 9.7 mg/dL (ref 8.6–10.3)
Chloride: 107 mmol/L (ref 98–110)
Creat: 0.78 mg/dL (ref 0.70–1.28)
Globulin: 2.6 g/dL (calc) (ref 1.9–3.7)
Glucose, Bld: 111 mg/dL — ABNORMAL HIGH (ref 65–99)
Potassium: 4 mmol/L (ref 3.5–5.3)
Sodium: 144 mmol/L (ref 135–146)
Total Bilirubin: 0.5 mg/dL (ref 0.2–1.2)
Total Protein: 6.4 g/dL (ref 6.1–8.1)
eGFR: 91 mL/min/{1.73_m2} (ref >=60)

## 2022-08-18 LAB — CBC WITH DIFFERENTIAL/PLATELET
Absolute Monocytes: 368 cells/uL (ref 200–950)
Basophils Absolute: 18 cells/uL (ref 0–200)
Basophils Relative: 0.4 %
Eosinophils Absolute: 120 cells/uL (ref 15–500)
Eosinophils Relative: 2.6 %
HCT: 37.3 % — ABNORMAL LOW (ref 38.5–50.0)
Hemoglobin: 12.9 g/dL — ABNORMAL LOW (ref 13.2–17.1)
Lymphs Abs: 966 cells/uL (ref 850–3900)
MCH: 33.3 pg — ABNORMAL HIGH (ref 27.0–33.0)
MCHC: 34.6 g/dL (ref 32.0–36.0)
MCV: 96.4 fL (ref 80.0–100.0)
MPV: 10.4 fL (ref 7.5–12.5)
Monocytes Relative: 8 %
Neutro Abs: 3128 cells/uL (ref 1500–7800)
Neutrophils Relative %: 68 %
Platelets: 143 10*3/uL (ref 140–400)
RBC: 3.87 10*6/uL — ABNORMAL LOW (ref 4.20–5.80)
RDW: 11.9 % (ref 11.0–15.0)
Total Lymphocyte: 21 %
WBC: 4.6 10*3/uL (ref 3.8–10.8)

## 2022-08-18 LAB — LIPID PANEL
Cholesterol: 99 mg/dL (ref <200)
HDL: 46 mg/dL (ref >=40)
LDL Cholesterol (Calc): 37 mg/dL (calc)
Non-HDL Cholesterol (Calc): 53 mg/dL (calc) (ref <130)
Total CHOL/HDL Ratio: 2.2 (calc) (ref <5.0)
Triglycerides: 78 mg/dL (ref <150)

## 2022-08-18 LAB — IRON,TIBC AND FERRITIN PANEL
%SAT: 45 % (calc) (ref 20–48)
Ferritin: 54 ng/mL (ref 24–380)
Iron: 128 ug/dL (ref 50–180)
TIBC: 286 mcg/dL (calc) (ref 250–425)

## 2022-08-18 LAB — B12 AND FOLATE PANEL
Folate: >24 ng/mL
Vitamin B-12: 915 pg/mL (ref 200–1100)

## 2022-08-18 LAB — VITAMIN D 25 HYDROXY (VIT D DEFICIENCY, FRACTURES): Vit D, 25-Hydroxy: 56 ng/mL (ref 30–100)

## 2022-08-31 ENCOUNTER — Ambulatory Visit: Payer: Self-pay

## 2022-08-31 ENCOUNTER — Telehealth: Payer: 59 | Admitting: Physician Assistant

## 2022-08-31 ENCOUNTER — Other Ambulatory Visit: Payer: Self-pay

## 2022-08-31 ENCOUNTER — Encounter: Payer: Self-pay | Admitting: Emergency Medicine

## 2022-08-31 ENCOUNTER — Emergency Department: Payer: 59

## 2022-08-31 ENCOUNTER — Emergency Department
Admission: EM | Admit: 2022-08-31 | Discharge: 2022-08-31 | Disposition: A | Discharge status: Home / Self Care | Payer: 59 | Attending: Student in an Organized Health Care Education/Training Program | Admitting: Student in an Organized Health Care Education/Training Program

## 2022-08-31 ENCOUNTER — Telehealth: Payer: 59

## 2022-08-31 DIAGNOSIS — U071 COVID-19: Secondary | ICD-10-CM | POA: Insufficient documentation

## 2022-08-31 DIAGNOSIS — I959 Hypotension, unspecified: Secondary | ICD-10-CM | POA: Diagnosis not present

## 2022-08-31 DIAGNOSIS — J029 Acute pharyngitis, unspecified: Secondary | ICD-10-CM | POA: Diagnosis present

## 2022-08-31 DIAGNOSIS — R059 Cough, unspecified: Secondary | ICD-10-CM | POA: Diagnosis not present

## 2022-08-31 DIAGNOSIS — Z743 Need for continuous supervision: Secondary | ICD-10-CM | POA: Diagnosis not present

## 2022-08-31 DIAGNOSIS — R070 Pain in throat: Secondary | ICD-10-CM | POA: Diagnosis not present

## 2022-08-31 DIAGNOSIS — R279 Unspecified lack of coordination: Secondary | ICD-10-CM | POA: Diagnosis not present

## 2022-08-31 LAB — CBC WITH DIFFERENTIAL/PLATELET
Abs Immature Granulocytes: 0.01 10*3/uL (ref 0.00–0.07)
Basophils Absolute: 0 10*3/uL (ref 0.0–0.1)
Basophils Relative: 0 %
Eosinophils Absolute: 0.1 10*3/uL (ref 0.0–0.5)
Eosinophils Relative: 1 %
HCT: 35.2 % — ABNORMAL LOW (ref 39.0–52.0)
Hemoglobin: 11.8 g/dL — ABNORMAL LOW (ref 13.0–17.0)
Immature Granulocytes: 0 %
Lymphocytes Relative: 15 %
Lymphs Abs: 0.9 10*3/uL (ref 0.7–4.0)
MCH: 33.2 pg (ref 26.0–34.0)
MCHC: 33.5 g/dL (ref 30.0–36.0)
MCV: 99.2 fL (ref 80.0–100.0)
Monocytes Absolute: 0.7 10*3/uL (ref 0.1–1.0)
Monocytes Relative: 11 %
Neutro Abs: 4.5 10*3/uL (ref 1.7–7.7)
Neutrophils Relative %: 73 %
Platelets: 112 10*3/uL — ABNORMAL LOW (ref 150–400)
RBC: 3.55 MIL/uL — ABNORMAL LOW (ref 4.22–5.81)
RDW: 13.5 % (ref 11.5–15.5)
WBC: 6.1 10*3/uL (ref 4.0–10.5)
nRBC: 0 % (ref 0.0–0.2)

## 2022-08-31 LAB — BASIC METABOLIC PANEL
Anion gap: 7 (ref 5–15)
BUN: 33 mg/dL — ABNORMAL HIGH (ref 8–23)
CO2: 24 mmol/L (ref 22–32)
Calcium: 8.6 mg/dL — ABNORMAL LOW (ref 8.9–10.3)
Chloride: 108 mmol/L (ref 98–111)
Creatinine, Ser: 0.67 mg/dL (ref 0.61–1.24)
GFR, Estimated: >60 mL/min (ref >60)
Glucose, Bld: 116 mg/dL — ABNORMAL HIGH (ref 70–99)
Potassium: 3.6 mmol/L (ref 3.5–5.1)
Sodium: 139 mmol/L (ref 135–145)

## 2022-08-31 LAB — SARS CORONAVIRUS 2 BY RT PCR: SARS Coronavirus 2 by RT PCR: POSITIVE — AB

## 2022-08-31 MED ORDER — NIRMATRELVIR/RITONAVIR (PAXLOVID)TABLET: 3.0000 | ORAL_TABLET | Freq: Two times a day (BID) | ORAL | 0 refills | Status: AC

## 2022-08-31 MED ORDER — SODIUM CHLORIDE 0.9 % IV BOLUS
500.0000 mL | Freq: Once | INTRAVENOUS | Status: AC
  Administered 2022-08-31: 500 mL via INTRAVENOUS

## 2022-08-31 NOTE — Progress Notes (Signed)
Virtual Visit Consent   [name], you are scheduled for a virtual visit with a [facility] provider today. Just as with appointments in the office, your consent must be obtained to participate. Your consent will be active for this visit and any virtual visit you may have with one of our providers in the next 365 days. If you have a MyChart account, a copy of this consent can be sent to you electronically.  As this is a virtual visit, video technology does not allow for your provider to perform a traditional examination. This may limit your provider's ability to fully assess your condition. If your provider identifies any concerns that need to be evaluated in person or the need to arrange testing (such as labs, EKG, etc.), we will make arrangements to do so. Although advances in technology are sophisticated, we cannot ensure that it will always work on either your end or our end. If the connection with a video visit is poor, the visit may have to be switched to a telephone visit. With either a video or telephone visit, we are not always able to ensure that we have a secure connection.  By engaging in this virtual visit, you consent to the provision of healthcare and authorize for your insurance to be billed (if applicable) for the services provided during this visit. Depending on your insurance coverage, you may receive a charge related to this service.  I need to obtain your verbal consent now. Are you willing to proceed with your visit today? [name] has provided verbal consent on 08/31/2022 for a virtual visit (video or telephone). [name], [place]  Date: 08/31/2022 11:20 AM  Virtual Visit via Video Note   I, [name], connected with  [name]  ([id number], December 18, 1940) on 08/31/22 at 11:00 AM EDT by a video-enabled telemedicine application and verified that I am speaking with the correct person using two identifiers.  Location: Patient: Virtual Visit  Location Patient: Home Provider: Virtual Visit Location Provider: Home Office   I discussed the limitations of evaluation and management by telemedicine and the availability of in person appointments. The patient expressed understanding and agreed to proceed.    History of Present Illness: [name] is a 82 y.o. who identifies as a male who was assigned male at birth, and is being seen today for COVID-19. Notes testing positive today on home . Symptoms starting yesterday cough which has continued today. No fever, chills.Cough is productive. 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 No complaints of pain. Is drinking well at least as of yesterday. Notes he had to be hospitalized previously for COVID.    HPI: HPI  Problems:  Patient Active Problem List   Diagnosis Date Noted   Nephrolithiasis 02/01/2022   Pressure injury of left heel, stage 2 (HCC) 02/01/2022   Atherosclerosis of aorta (HCC) 02/01/2022   History of iron deficiency anemia 02/01/2022   Urinary incontinence 05/26/2021   Hypotension 05/20/2021   History of rectal bleeding 05/09/2020   Hematochezia    BPH (benign prostatic hyperplasia)    Hyperlipidemia    Depression with anxiety    Personal history of urinary calculi 06/21/2019   PAD (peripheral artery disease) (HCC) 07/04/2017   Aortic valve calcification 07/04/2017   Coronary artery calcification 07/04/2017   Late effect acute polio 05/17/2016   Chronic constipation 11/19/2015   Mild major depression (  HCC) 11/19/2015   Bacteriuria, chronic 09/11/2015   History of prostate cancer 03/02/2015   Amyotrophia 03/02/2015   Edema leg 03/02/2015   Cerebral palsy (HCC) 03/02/2015   Dyslipidemia 03/02/2015   Dermatitis, eczematoid 03/02/2015   Divergent squint 03/02/2015   H/O acute poliomyelitis 03/02/2015   Lymphedema 03/02/2015   Intellectual disability 03/02/2015    Hypertrophy of nail 03/02/2015   Chronic venous insufficiency 03/02/2015   Avitaminosis D 03/02/2015   Adynamia 03/02/2015   Dependent on wheelchair 03/02/2015   Renal cysts, acquired, bilateral 03/02/2015   At risk for falling 03/02/2015   Cerebral vascular disease 03/02/2015   Strabismus 02/11/2015    Allergies: No Known Allergies Medications:  Current Outpatient Medications:    acetaminophen (TYLENOL) 325 MG tablet, TAKE 2 TABLETS (650 MG TOTAL) BY MOUTH EVERY 6 HOURS AS NEEDED FOR MILD PAIN OR HEADACHE., Disp: 60 tablet, Rfl: 5   alfuzosin (UROXATRAL) 10 MG 24 hr tablet, TAKE 1 TABLET BY MOUTH DAILY, Disp: 90 tablet, Rfl: 1   ALPRAZolam (XANAX) 0.25 MG tablet, Take 1 tablet (0.25 mg total) by mouth daily as needed (agitation (crisis med))., Disp: 20 tablet, Rfl: 0   ARTIFICIAL TEARS 0.2-0.2-1 % SOLN, PLACE 2 DROPS INTO EYE 2 TIMES A DAY AS NEEDED, Disp: 15 mL, Rfl: 2   atorvastatin (LIPITOR) 40 MG tablet, TAKE 1 TABLET BY MOUTH EVERY EVENING, Disp: 90 tablet, Rfl: 1   augmented betamethasone dipropionate (DIPROLENE-AF) 0.05 % cream, APPLY TOPICALLY AS DIRECTED TO RASH AS NEEDED, Disp: 45 g, Rfl: 0   Calcium Carb-Cholecalciferol 500-10 MG-MCG TABS, TAKE 1 TABLET BY MOUTH TWICE DAILY., Disp: 180 tablet, Rfl: 0   Cranberry Juice Powder 425 MG CAPS, TAKE 1 CAPSULE BY MOUTH ONCE DAILY, Disp: 90 capsule, Rfl: 1   Elastic Bandages & Supports (MEDICAL COMPRESSION STOCKINGS) MISC, 1 Units by Does not apply route daily., Disp: 2 each, Rfl: 0   GNP VITAMIN C 500 MG tablet, TAKE ONE TABLET BY MOUTH ONCE DAILY, Disp: 100 tablet, Rfl: 1   Incontinence Supply Disposable (DEPEND SILHOUETTE BRIEFS L/XL) MISC, 1 each by Does not apply route 2 (two) times daily as needed., Disp: 60 each, Rfl: 5   Iron, Ferrous Sulfate, 325 (65 Fe) MG TABS, Take 325 mg by mouth daily., Disp: 30 tablet, Rfl: 3   lubiprostone (AMITIZA) 24 MCG capsule, TAKE 1 CAPSULE BY MOUTH 2 TIMES DAILY WITH A MEAL, Disp: 60 capsule, Rfl:  5   Nutritional Supplements (ENSURE ORIGINAL) LIQD, DRINK 1 BOTTLE BY MOUTH 3 TIMES DAILY, Disp: 5.688 mL, Rfl: 0   nystatin cream (MYCOSTATIN), APPLY 1 APPLICATION TOPICALLY 2 TIMES DAILY, Disp: 30 g, Rfl: 0   Polyethyl Glycol-Propyl Glycol (SYSTANE ULTRA OP), Place 1 drop into both eyes 2 (two) times daily as needed (dry eyes)., Disp: , Rfl:    sertraline (ZOLOFT) 100 MG tablet, TAKE 1 TABLET BY MOUTH DAILY, Disp: 90 tablet, Rfl: 1  Observations/Objective: Patient is resting in bed at home. Seems mildly SOB at rest.  Head is normocephalic, atraumatic.  Speech is slow. Patient is awake but falls asleep quickly and is somewhat hard to rouse.  Pulse ox is not registering.  Assessment and Plan: 1. COVID-19  Giving age, risks, SOB at rest and level or alertness, needs higher level of care ASAP. Caregivers to call EMS now for assessment and transportation. Called patients legal guardian to make her aware and she voiced aprpeciation.   Follow Up Instructions: I discussed the assessment and treatment plan with the patient.  The patient was provided an opportunity to ask questions and all were answered. The patient agreed with the plan and demonstrated an understanding of the instructions.  A copy of instructions were sent to the patient via MyChart unless otherwise noted below.   The patient was advised to call back or seek an in-person evaluation if the symptoms worsen or if the condition fails to improve as anticipated.  Time:  I spent 10 minutes with the patient via telehealth technology discussing the above problems/concerns.    Kevin Climes, PA-C

## 2022-08-31 NOTE — ED Triage Notes (Signed)
Patient to ED via [facility] from group home ([address] for sore throat. Currently an outbreak of Covid in group home. Symptoms started today.  EMS VS: 70 HR 98% RA 90/50  20L Hand given NaCl

## 2022-08-31 NOTE — ED Provider Notes (Signed)
[facility] Provider Note    Event Date/Time   First MD Initiated Contact with Patient 08/31/22 1225     (approximate)   History   Sore Throat   HPI  [name] is a 82 y.o. male presenting from group home due to congestion flu reported sore throat.  Group home is having outbreak of COVID currently.  No additional complaints or concerns.  He is vaccinated.     Physical Exam   Triage Vital Signs: ED Triage Vitals [08/31/22 1204]  Enc Vitals Group     BP 114/65     Pulse Rate 68     Resp 18     Temp 97.9 F (36.6 C)     Temp Source Axillary     SpO2 98 %     Weight      Height      Head Circumference      Peak Flow      Pain Score      Pain Loc      Pain Edu?      Excl. in GC?     Most recent vital signs: Vitals:   08/31/22 1204 08/31/22 1255  BP: 114/65 101/65  Pulse: 68 63  Resp: 18 20  Temp: 97.9 F (36.6 C)   SpO2: 98% 98%     Constitutional: Alert  Eyes: Conjunctivae are normal.  Head: Atraumatic. Nose: No congestion/rhinnorhea. Mouth/Throat: Mucous membranes are moist.   Neck: Painless ROM.  Cardiovascular:   Good peripheral circulation. Respiratory: Normal respiratory effort.  No retractions.  Gastrointestinal: Soft and nontender.  Musculoskeletal:  no deformity Neurologic:  MAE spontaneously. No gross focal neurologic deficits are appreciated.  Skin:  Skin is warm, dry and intact. No rash noted. Psychiatric: Mood and affect are normal. Speech and behavior are normal.    ED Results / Procedures / Treatments   Labs (all labs ordered are listed, but only abnormal results are displayed) Labs Reviewed  SARS CORONAVIRUS 2 BY RT PCR - Abnormal; Notable for the following components:      Result Value   SARS Coronavirus 2 by RT PCR POSITIVE (*)    All other components within normal limits  CBC WITH DIFFERENTIAL/PLATELET - Abnormal; Notable for the following components:   RBC 3.55 (*)    Hemoglobin 11.8 (*)     HCT 35.2 (*)    Platelets 112 (*)    All other components within normal limits  BASIC METABOLIC PANEL - Abnormal; Notable for the following components:   Glucose, Bld 116 (*)    BUN 33 (*)    Calcium 8.6 (*)    All other components within normal limits     EKG     RADIOLOGY Patient presenting to the ER for evaluation of symptoms as described above.  Based on symptoms, risk factors and considered above differential, this presenting complaint could reflect a potentially life-threatening illness therefore the patient will be placed on continuous pulse oximetry and telemetry for monitoring.  Laboratory evaluation will be sent to evaluate for the above complaints.      PROCEDURES:  Critical Care performed:   Procedures   MEDICATIONS ORDERED IN ED: Medications  sodium chloride 0.9 % bolus 500 mL (500 mLs Intravenous New Bag/Given 08/31/22 1252)     IMPRESSION / MDM / ASSESSMENT AND PLAN / ED COURSE  I reviewed the triage vital signs and the nursing notes.  Differential diagnosis includes, but is not limited to, COVID, URI, flu, dehydration, electrolyte abnormality, strep throat  Patient presenting to the ER for evaluation of symptoms as described above.  Based on symptoms, risk factors and considered above differential, this presenting complaint could reflect a potentially life-threatening illness therefore the patient will be placed on continuous pulse oximetry and telemetry for monitoring.  Laboratory evaluation will be sent to evaluate for the above complaints.  Patient clinically nontoxic-appearing chronically ill appearing no hypoxia no tachycardia not febrile.    Clinical Course as of 08/31/22 1320  Thu Aug 31, 2022  1312 Chest x-ray my review and interval patient without evidence of consolidation.  Remains satting well on room air.  No respiratory distress.  Does appear appropriate for outpatient follow-up.  Case discussed with patient's  legal guardian who consents for treatment with Paxlovid.  This has been sent to his pharmacy. [PR]    Clinical Course User Index [PR] Willy Eddy, MD     FINAL CLINICAL IMPRESSION(S) / ED DIAGNOSES   Final diagnoses:  COVID-19     Rx / DC Orders   ED Discharge Orders          Ordered    nirmatrelvir/ritonavir (PAXLOVID) 20 x 150 MG & 10 x 100MG  TABS  2 times daily        08/31/22 1313             Note:  This document was prepared using Dragon voice recognition software and may include unintentional dictation errors.    Willy Eddy, MD 08/31/22 1320

## 2022-08-31 NOTE — Telephone Encounter (Signed)
  Chief Complaint: COVID + Symptoms: Cough, Low fever, feels poorly Frequency: last night Pertinent Negatives: Patient denies  Disposition: [] ED /[] Urgent Care (no appt availability in office) / [] Appointment(In office/virtual)/ [x]  [place] [facility]/ [] Home Care/ [] Refused Recommended Disposition /[]  Mobile Bus/ []  Follow-up with PCP Additional Notes: Spoke with Marigene Ehlers with pt's permission. Pt started feeling poorly last night and is worse this morning. Pt test ed+ for COVID this morning. Pt would need a VV. No virtual appts available at office. Made appt with [facility].     Summary: Pt tested positive for Covid and requests Rx   Marigene Ehlers, manager of group home, reports that the pt tested positive for Covid and requests Rx for the symptoms. Cb# [id number]     Reason for Disposition  [1] HIGH RISK patient (e.g., weak immune system, age > 64 years, obesity with BMI 30 or higher, pregnant, chronic lung disease or other chronic medical condition) AND [2] COVID symptoms (e.g., cough, fever)  (Exceptions: Already seen by PCP and no new or worsening symptoms.)  Answer Assessment - Initial Assessment Questions 1. COVID-19 DIAGNOSIS: "How do you know that you have COVID?" (e.g., positive lab test or self-test, diagnosed by doctor or NP/PA, symptoms after exposure).     This morning home test 2. COVID-19 EXPOSURE: "Was there any known exposure to COVID before the symptoms began?" CDC Definition of close contact: within 6 feet (2 meters) for a total of 15 minutes or more over a 24-hour period.       3. ONSET: "When did the COVID-19 symptoms start?"      Last night - worse today 4. WORST SYMPTOM: "What is your worst symptom?" (e.g., cough, fever, shortness of breath, muscle aches)     Low fever, productive cough, feels poorly 5. COUGH: "Do you have a cough?" If Yes, ask: "How bad is the cough?"       yes 6. FEVER: "Do you have a fever?" If Yes, ask: "What is your  temperature, how was it measured, and when did it start?"     low 7. RESPIRATORY STATUS: "Describe your breathing?" (e.g., normal; shortness of breath, wheezing, unable to speak)       8. BETTER-SAME-WORSE: "Are you getting better, staying the same or getting worse compared to yesterday?"  If getting worse, ask, "In what way?"     worse 9. OTHER SYMPTOMS: "Do you have any other symptoms?"  (e.g., chills, fatigue, headache, loss of smell or taste, muscle pain, sore throat)     Feels poorly  Protocols used: Coronavirus (COVID-19) Diagnosed or Suspected-A-AH

## 2022-08-31 NOTE — Telephone Encounter (Signed)
FYI Pt has a video visit at 11 today

## 2022-09-05 ENCOUNTER — Emergency Department
Admission: EM | Admit: 2022-09-05 | Discharge: 2022-09-05 | Disposition: A | Discharge status: Home / Self Care | Payer: 59 | Attending: Emergency Medicine | Admitting: Emergency Medicine

## 2022-09-05 ENCOUNTER — Emergency Department: Payer: 59

## 2022-09-05 ENCOUNTER — Other Ambulatory Visit: Payer: Self-pay

## 2022-09-05 DIAGNOSIS — R0689 Other abnormalities of breathing: Secondary | ICD-10-CM | POA: Diagnosis not present

## 2022-09-05 DIAGNOSIS — E860 Dehydration: Secondary | ICD-10-CM | POA: Diagnosis not present

## 2022-09-05 DIAGNOSIS — R0602 Shortness of breath: Secondary | ICD-10-CM | POA: Diagnosis not present

## 2022-09-05 DIAGNOSIS — Z741 Need for assistance with personal care: Secondary | ICD-10-CM | POA: Diagnosis not present

## 2022-09-05 DIAGNOSIS — R61 Generalized hyperhidrosis: Secondary | ICD-10-CM | POA: Diagnosis not present

## 2022-09-05 DIAGNOSIS — Z8546 Personal history of malignant neoplasm of prostate: Secondary | ICD-10-CM | POA: Insufficient documentation

## 2022-09-05 DIAGNOSIS — J40 Bronchitis, not specified as acute or chronic: Secondary | ICD-10-CM | POA: Diagnosis not present

## 2022-09-05 DIAGNOSIS — R4182 Altered mental status, unspecified: Secondary | ICD-10-CM | POA: Diagnosis present

## 2022-09-05 DIAGNOSIS — Z8616 Personal history of COVID-19: Secondary | ICD-10-CM | POA: Diagnosis not present

## 2022-09-05 DIAGNOSIS — U071 COVID-19: Secondary | ICD-10-CM | POA: Diagnosis not present

## 2022-09-05 DIAGNOSIS — R231 Pallor: Secondary | ICD-10-CM | POA: Diagnosis not present

## 2022-09-05 DIAGNOSIS — R404 Transient alteration of awareness: Secondary | ICD-10-CM | POA: Diagnosis not present

## 2022-09-05 LAB — BASIC METABOLIC PANEL
Anion gap: 10 (ref 5–15)
BUN: 32 mg/dL — ABNORMAL HIGH (ref 8–23)
CO2: 25 mmol/L (ref 22–32)
Calcium: 9.3 mg/dL (ref 8.9–10.3)
Chloride: 110 mmol/L (ref 98–111)
Creatinine, Ser: 0.75 mg/dL (ref 0.61–1.24)
GFR, Estimated: >60 mL/min (ref >60)
Glucose, Bld: 106 mg/dL — ABNORMAL HIGH (ref 70–99)
Potassium: 3.6 mmol/L (ref 3.5–5.1)
Sodium: 145 mmol/L (ref 135–145)

## 2022-09-05 LAB — CBC
HCT: 36.9 % — ABNORMAL LOW (ref 39.0–52.0)
Hemoglobin: 12.4 g/dL — ABNORMAL LOW (ref 13.0–17.0)
MCH: 33.8 pg (ref 26.0–34.0)
MCHC: 33.6 g/dL (ref 30.0–36.0)
MCV: 100.5 fL — ABNORMAL HIGH (ref 80.0–100.0)
Platelets: 155 10*3/uL (ref 150–400)
RBC: 3.67 MIL/uL — ABNORMAL LOW (ref 4.22–5.81)
RDW: 13.7 % (ref 11.5–15.5)
WBC: 11.5 10*3/uL — ABNORMAL HIGH (ref 4.0–10.5)
nRBC: 0 % (ref 0.0–0.2)

## 2022-09-05 MED ORDER — LACTATED RINGERS IV BOLUS
1000.0000 mL | Freq: Once | INTRAVENOUS | Status: AC
  Administered 2022-09-05: 1000 mL via INTRAVENOUS

## 2022-09-05 MED ORDER — FAMOTIDINE 20 MG PO TABS: 20.0000 mg | ORAL_TABLET | Freq: Two times a day (BID) | ORAL | 0 refills | Status: DC

## 2022-09-05 MED ORDER — ONDANSETRON 4 MG PO TBDP: 4.0000 mg | ORAL_TABLET | Freq: Three times a day (TID) | ORAL | 0 refills | Status: DC | PRN

## 2022-09-05 MED ORDER — AZITHROMYCIN 500 MG PO TABS: 500.0000 mg | ORAL_TABLET | Freq: Every day | ORAL | 0 refills | Status: DC

## 2022-09-05 MED ORDER — AZITHROMYCIN 250 MG PO TABS: ORAL_TABLET | ORAL | 0 refills | Status: AC

## 2022-09-05 MED ORDER — ONDANSETRON HCL 4 MG/2ML IJ SOLN
4.0000 mg | Freq: Once | INTRAMUSCULAR | Status: AC
  Administered 2022-09-05: 4 mg via INTRAVENOUS
  Filled 2022-09-05: qty 2

## 2022-09-05 MED ORDER — SODIUM CHLORIDE 0.9 % IV SOLN
500.0000 mg | Freq: Once | INTRAVENOUS | Status: AC
  Administered 2022-09-05: 500 mg via INTRAVENOUS
  Filled 2022-09-05: qty 5

## 2022-09-05 MED ORDER — PANTOPRAZOLE SODIUM 40 MG IV SOLR
40.0000 mg | Freq: Once | INTRAVENOUS | Status: AC
  Administered 2022-09-05: 40 mg via INTRAVENOUS
  Filled 2022-09-05: qty 10

## 2022-09-05 NOTE — Discharge Instructions (Addendum)
Your chest x-ray and lab test today are all okay.  You were found to be dehydrated and given IV fluids to help with this.  Continue drinking plenty of liquids to stay hydrated and keep taking your other medications.  We have sent a prescription for azithromycin to ensure you do not develop pneumonia, and Zofran to help with nausea if needed.

## 2022-09-05 NOTE — ED Provider Notes (Signed)
[facility] Provider Note    Event Date/Time   First MD Initiated Contact with Patient 09/05/22 1016     (approximate)   History   Chief Complaint: Altered Mental Status   HPI  [name] is a 82 y.o. male with a history of cerebral palsy, prostate cancer who was sent to the ED from his group home due to decreased alertness.  Patient was diagnosed with COVID 5 days ago.  Patient denies shortness of breath or any pain.  No headache chest pain or belly pain.  No falls or trauma.   Physical Exam   Triage Vital Signs: ED Triage Vitals  Enc Vitals Group     BP 09/05/22 1008 111/69     Pulse Rate 09/05/22 1008 69     Resp 09/05/22 1008 12     Temp --      Temp src --      SpO2 09/05/22 1008 95 %     Weight 09/05/22 1010 135 lb (61.2 kg)     Height --      Head Circumference --      Peak Flow --      Pain Score --      Pain Loc --      Pain Edu? --      Excl. in GC? --     Most recent vital signs: Vitals:   09/05/22 1345 09/05/22 1400  BP: 90/76 110/89  Pulse: 60 (!) 56  Resp: 15 19  SpO2: 97% 100%    General: Awake, no distress.  Dry mucous membranes CV:  Good peripheral perfusion.  Regular rate and rhythm Resp:  Normal effort.  Clear to auscultation bilaterally Abd:  No distention.  Soft nontender Other:  No evidence of trauma.  Full range of motion all extremities, no tenderness or wounds.   ED Results / Procedures / Treatments   Labs (all labs ordered are listed, but only abnormal results are displayed) Labs Reviewed  CBC - Abnormal; Notable for the following components:      Result Value   WBC 11.5 (*)    RBC 3.67 (*)    Hemoglobin 12.4 (*)    HCT 36.9 (*)    MCV 100.5 (*)    All other components within normal limits  BASIC METABOLIC PANEL - Abnormal; Notable for the following components:   Glucose, Bld 106 (*)    BUN 32 (*)    All other components within normal limits     EKG Interpreted by me Normal sinus  rhythm rate of 68.  Normal axis, normal intervals.  Normal QRS ST segments and T waves.  No ischemic changes.   RADIOLOGY Chest x-ray interpreted by me, negative for effusion infiltrate or edema.  No pneumothorax.  Radiology report reviewed.   PROCEDURES:  Procedures   MEDICATIONS ORDERED IN ED: Medications  azithromycin (ZITHROMAX) 500 mg in sodium chloride 0.9 % 250 mL IVPB (has no administration in time range)  lactated ringers bolus 1,000 mL (0 mLs Intravenous Stopped 09/05/22 1324)  ondansetron (ZOFRAN) injection 4 mg (4 mg Intravenous Given 09/05/22 1128)  pantoprazole (PROTONIX) injection 40 mg (40 mg Intravenous Given 09/05/22 1128)     IMPRESSION / MDM / ASSESSMENT AND PLAN / ED COURSE  I reviewed the triage vital signs and the nursing notes.  DDx: Dehydration, anemia, fatigue, pneumonia, AKI, electrolyte abnormality  Patient's presentation is most consistent with acute presentation with potential threat to life or bodily function.  Patient presents with decreased energy level with recent COVID illness.  Clinically he appears dehydrated.  Vital signs are normal, exam is otherwise nonfocal.  He interacts and answers questions appropriately.  Doubt stroke, intracranial hemorrhage, brain mass, ACS PE dissection pericardial effusion, bowel obstruction or surgical abdomen.  Will give IV fluids, Protonix and Zofran for symptom relief and reassess.   ----------------------------------------- 2:36 PM on 09/05/2022 ----------------------------------------- Labs and chest x-ray reassuring.  Will give medications for GI symptoms to help facilitate continued hydration.  Will start on short course of azithromycin to guard against developing superimposed pneumonia.      FINAL CLINICAL IMPRESSION(S) / ED DIAGNOSES   Final diagnoses:  Dehydration  Bronchitis     Rx / DC Orders   ED Discharge Orders          Ordered    azithromycin (ZITHROMAX) 500 MG tablet  Daily         09/05/22 1436    famotidine (PEPCID) 20 MG tablet  2 times daily        09/05/22 1436    ondansetron (ZOFRAN-ODT) 4 MG disintegrating tablet  Every 8 hours PRN        09/05/22 1436             Note:  This document was prepared using Dragon voice recognition software and may include unintentional dictation errors.   Sharman Cheek, MD 09/05/22 [phone]

## 2022-09-05 NOTE — ED Triage Notes (Signed)
See first nurse note. Pt to ED [facility] for AMS. Unknown baseline. COVID + 5 days ago. Pt not answering all orientation questions. Appears shob.    Legal guardian coordinator [name]  [phone] Legal guardian [name] [phone]  [name] made aware of arrival .

## 2022-09-05 NOTE — ED Triage Notes (Addendum)
First nurse note: Pt here via [facility] with c/o of AMS. Pt here from [facility] group home. Pt COVID + 5 days ago.   RR 12-12 ETCO2 28-30 98.9 oral 94-95% 134/70

## 2022-09-11 ENCOUNTER — Encounter: Payer: Self-pay | Admitting: Physician Assistant

## 2022-09-11 ENCOUNTER — Ambulatory Visit (INDEPENDENT_AMBULATORY_CARE_PROVIDER_SITE_OTHER): Payer: 59 | Admitting: Physician Assistant

## 2022-09-11 VITALS — BP 124/80 | HR 55 | Temp 98.2°F | Resp 18 | Ht 73.0 in

## 2022-09-11 DIAGNOSIS — Z09 Encounter for follow-up examination after completed treatment for conditions other than malignant neoplasm: Secondary | ICD-10-CM

## 2022-09-11 DIAGNOSIS — U071 COVID-19: Secondary | ICD-10-CM | POA: Diagnosis not present

## 2022-09-11 NOTE — Progress Notes (Signed)
Acute Office Visit   Patient: [name]   DOB: April 10, 1941   82 y.o. Male  MRN: [id number] Visit Date: 09/11/2022  Today's healthcare provider: Oswaldo Conroy Vanassa Penniman, PA-C  Introduced myself to the patient as a Secondary school teacher and provided education on APPs in clinical practice.    Chief Complaint  Patient presents with   Follow-up    ER covid   Subjective    HPI HPI     Follow-up    Additional comments: ER covid      Last edited by Marcos Eke, CMA on 09/11/2022  1:38 PM.        He is here with his caregivers who are providing HPI   They report he was seen in the ED for COVID complications and was found to be dehydrated They say he is drinking and when he eats he is keeping everything down but his appetite seems a bit lower than normal They report he is having some diarrhea  COVID was diagnosed about 10 days ago  Patient reports that he is feeling better and denies SOB or wheezing Interventions: was given Paxlovid and PRN Sudafed and cough medication    Medications: Outpatient Medications Prior to Visit  Medication Sig   acetaminophen (TYLENOL) 325 MG tablet TAKE 2 TABLETS (650 MG TOTAL) BY MOUTH EVERY 6 HOURS AS NEEDED FOR MILD PAIN OR HEADACHE.   alfuzosin (UROXATRAL) 10 MG 24 hr tablet TAKE 1 TABLET BY MOUTH DAILY   ALPRAZolam (XANAX) 0.25 MG tablet Take 1 tablet (0.25 mg total) by mouth daily as needed (agitation (crisis med)).   ARTIFICIAL TEARS 0.2-0.2-1 % SOLN PLACE 2 DROPS INTO EYE 2 TIMES A DAY AS NEEDED   atorvastatin (LIPITOR) 40 MG tablet TAKE 1 TABLET BY MOUTH EVERY EVENING   augmented betamethasone dipropionate (DIPROLENE-AF) 0.05 % cream APPLY TOPICALLY AS DIRECTED TO RASH AS NEEDED   Calcium Carb-Cholecalciferol 500-10 MG-MCG TABS TAKE 1 TABLET BY MOUTH TWICE DAILY.   Cranberry Juice Powder 425 MG CAPS TAKE 1 CAPSULE BY MOUTH ONCE DAILY   Elastic Bandages & Supports (MEDICAL COMPRESSION STOCKINGS) MISC 1 Units by Does not apply route daily.    famotidine (PEPCID) 20 MG tablet Take 1 tablet (20 mg total) by mouth 2 (two) times daily.   GNP VITAMIN C 500 MG tablet TAKE ONE TABLET BY MOUTH ONCE DAILY   Incontinence Supply Disposable (DEPEND SILHOUETTE BRIEFS L/XL) MISC 1 each by Does not apply route 2 (two) times daily as needed.   Iron, Ferrous Sulfate, 325 (65 Fe) MG TABS Take 325 mg by mouth daily.   lubiprostone (AMITIZA) 24 MCG capsule TAKE 1 CAPSULE BY MOUTH 2 TIMES DAILY WITH A MEAL   Nutritional Supplements (ENSURE ORIGINAL) LIQD DRINK 1 BOTTLE BY MOUTH 3 TIMES DAILY   nystatin cream (MYCOSTATIN) APPLY 1 APPLICATION TOPICALLY 2 TIMES DAILY   ondansetron (ZOFRAN-ODT) 4 MG disintegrating tablet Take 1 tablet (4 mg total) by mouth every 8 (eight) hours as needed for nausea or vomiting.   Polyethyl Glycol-Propyl Glycol (SYSTANE ULTRA OP) Place 1 drop into both eyes 2 (two) times daily as needed (dry eyes).   sertraline (ZOLOFT) 100 MG tablet TAKE 1 TABLET BY MOUTH DAILY   No facility-administered medications prior to visit.    Review of Systems  Constitutional:  Positive for appetite change and fatigue. Negative for chills and fever.  HENT:  Positive for congestion. Negative for sore throat.   Respiratory:  Negative for  cough, shortness of breath and wheezing.   Gastrointestinal:  Positive for diarrhea. Negative for nausea and vomiting.       Objective    BP 124/80   Pulse (!) 55   Temp 98.2 F (36.8 C)   Resp 18   Ht 6\' 1"  (1.854 m)   SpO2 97%   BMI 17.81 kg/m    Physical Exam Vitals reviewed.  Constitutional:      General: He is awake.     Appearance: Normal appearance. He is well-groomed.  HENT:     Head: Normocephalic and atraumatic.  Cardiovascular:     Rate and Rhythm: Normal rate and regular rhythm.     Pulses: Normal pulses.     Heart sounds: Normal heart sounds. No murmur heard.    No friction rub. No gallop.  Pulmonary:     Effort: Pulmonary effort is normal.     Breath sounds: Normal breath  sounds. No decreased air movement. No decreased breath sounds, wheezing, rhonchi or rales.  Neurological:     Mental Status: He is alert. Mental status is at baseline.     GCS: GCS eye subscore is 4. GCS verbal subscore is 5. GCS motor subscore is 6.     Motor: Abnormal muscle tone present.  Psychiatric:        Attention and Perception: Attention normal.        Mood and Affect: Mood normal.        Behavior: Behavior normal. Behavior is cooperative.       No results found for any visits on 09/11/22.  Assessment & Plan      No follow-ups on file.      Problem List Items Addressed This Visit   None Visit Diagnoses     Follow-up examination    -  Primary Patient was seen in the ED for COVID on 08/31/22 and then again for dehydration on 09/05/22  I have reviewed the ED visit notes from both encounters along with lab and imagine results He is recovering from his recent COVID infection and he and caregivers deny concern for breathing issues, persistent fever, chills  His caregivers report he has a seemingly reduced appetite but is taking liquids without issue Recommend continued focus on hydration and can use liquid meal replacements, soups, broth,etc to assist with caloric intake Recommend gradual return to regular diet as tolerated  Recommend continued use of Zofran and PRN cold medications as needed to assist with symptoms Follow up as needed for persistent or progressing symptoms     COVID-19            No follow-ups on file.   I, Conlin Brahm E Rosemond Lyttle, PA-C, have reviewed all documentation for this visit. The documentation on 09/11/22 for the exam, diagnosis, procedures, and orders are all accurate and complete.   Jacquelin Hawking, MHS, PA-C [facility]

## 2022-09-18 ENCOUNTER — Other Ambulatory Visit: Payer: Self-pay | Admitting: Family Medicine

## 2022-09-19 DIAGNOSIS — H2513 Age-related nuclear cataract, bilateral: Secondary | ICD-10-CM | POA: Diagnosis not present

## 2022-09-19 DIAGNOSIS — H50011 Monocular esotropia, right eye: Secondary | ICD-10-CM | POA: Diagnosis not present

## 2022-09-19 DIAGNOSIS — H02831 Dermatochalasis of right upper eyelid: Secondary | ICD-10-CM | POA: Diagnosis not present

## 2022-09-19 DIAGNOSIS — H53023 Refractive amblyopia, bilateral: Secondary | ICD-10-CM | POA: Diagnosis not present

## 2022-09-19 DIAGNOSIS — H02834 Dermatochalasis of left upper eyelid: Secondary | ICD-10-CM | POA: Diagnosis not present

## 2022-09-19 DIAGNOSIS — H524 Presbyopia: Secondary | ICD-10-CM | POA: Diagnosis not present

## 2022-09-29 ENCOUNTER — Ambulatory Visit (INDEPENDENT_AMBULATORY_CARE_PROVIDER_SITE_OTHER): Payer: 59

## 2022-09-29 VITALS — Ht 69.0 in | Wt 135.0 lb

## 2022-09-29 DIAGNOSIS — Z0000 Encounter for general adult medical examination without abnormal findings: Secondary | ICD-10-CM

## 2022-09-29 NOTE — Progress Notes (Signed)
I connected with  [name] and his caregiver, [name] on 09/29/22 by a audio enabled telemedicine application and verified that I am speaking with the correct person using two identifiers.  Patient Location: Home  Provider Location: Office/Clinic  I discussed the limitations of evaluation and management by telemedicine. The patient expressed understanding and agreed to proceed.  Subjective:   QUADRI PERDOMO is a 82 y.o. male who presents for Medicare Annual/Subsequent preventive examination.  Review of Systems    Cardiac Risk Factors include: advanced age (>21men, >44 women);dyslipidemia;male gender;sedentary lifestyle    Objective:    Today's Vitals   09/29/22 1026  Weight: 135 lb (61.2 kg)  Height: 5\' 9"  (1.753 m)   Body mass index is 19.94 kg/m.     09/05/2022   10:11 AM 08/31/2022   12:05 PM 04/10/2022    9:02 AM 09/30/2021   10:26 AM 09/22/2021    5:28 PM 09/20/2021   11:06 AM 09/15/2021   10:06 AM  Advanced Directives  Does Patient Have a Medical Advance Directive? Yes No No No No No Yes  Would patient like information on creating a medical advance directive?      No - Patient declined     Current Medications (verified) Outpatient Encounter Medications as of 09/29/2022  Medication Sig   Calcium Carb-Cholecalciferol (CALCIUM + VITAMIN D3) 600-10 MG-MCG TABS TAKE 1 TABLET BY MOUTH TWICE DAILY.   acetaminophen (TYLENOL) 325 MG tablet TAKE 2 TABLETS (650 MG TOTAL) BY MOUTH EVERY 6 HOURS AS NEEDED FOR MILD PAIN OR HEADACHE.   alfuzosin (UROXATRAL) 10 MG 24 hr tablet TAKE 1 TABLET BY MOUTH DAILY   ALPRAZolam (XANAX) 0.25 MG tablet Take 1 tablet (0.25 mg total) by mouth daily as needed (agitation (crisis med)).   ARTIFICIAL TEARS 0.2-0.2-1 % SOLN PLACE 2 DROPS INTO EYE 2 TIMES A DAY AS NEEDED   atorvastatin (LIPITOR) 40 MG tablet TAKE 1 TABLET BY MOUTH EVERY EVENING   augmented betamethasone dipropionate (DIPROLENE-AF) 0.05 % cream APPLY TOPICALLY AS DIRECTED TO RASH  AS NEEDED   Calcium Carb-Cholecalciferol 500-10 MG-MCG TABS TAKE 1 TABLET BY MOUTH TWICE DAILY.   Cranberry Juice Powder 425 MG CAPS TAKE 1 CAPSULE BY MOUTH ONCE DAILY   Elastic Bandages & Supports (MEDICAL COMPRESSION STOCKINGS) MISC 1 Units by Does not apply route daily.   famotidine (PEPCID) 20 MG tablet Take 1 tablet (20 mg total) by mouth 2 (two) times daily.   GNP VITAMIN C 500 MG tablet TAKE ONE TABLET BY MOUTH ONCE DAILY   Incontinence Supply Disposable (DEPEND SILHOUETTE BRIEFS L/XL) MISC 1 each by Does not apply route 2 (two) times daily as needed.   Iron, Ferrous Sulfate, 325 (65 Fe) MG TABS Take 325 mg by mouth daily.   lubiprostone (AMITIZA) 24 MCG capsule TAKE 1 CAPSULE BY MOUTH 2 TIMES DAILY WITH A MEAL   Nutritional Supplements (ENSURE ORIGINAL) LIQD DRINK 1 BOTTLE BY MOUTH 3 TIMES DAILY   nystatin cream (MYCOSTATIN) APPLY 1 APPLICATION TOPICALLY 2 TIMES DAILY   ondansetron (ZOFRAN-ODT) 4 MG disintegrating tablet Take 1 tablet (4 mg total) by mouth every 8 (eight) hours as needed for nausea or vomiting.   Polyethyl Glycol-Propyl Glycol (SYSTANE ULTRA OP) Place 1 drop into both eyes 2 (two) times daily as needed (dry eyes).   sertraline (ZOLOFT) 100 MG tablet TAKE 1 TABLET BY MOUTH DAILY   No facility-administered encounter medications on file as of 09/29/2022.    Allergies (verified) Patient has no known allergies.  History: Past Medical History:  Diagnosis Date   Anxiety    Atrophy, disuse, muscle    BPH (benign prostatic hyperplasia)    Cerebral palsy (HCC)    Eczema    Elevated lipids    Elevated PSA    Exotropia    History of kidney stones    Hyperlipidemia    Kidney stone    Leukopenia    Lower extremity edema    lymphedema   Mental retardation    Over weight    Prostate cancer (HCC)    Venous insufficiency    Vitamin D deficiency    Wheelchair dependence    Past Surgical History:  Procedure Laterality Date   COLONOSCOPY WITH PROPOFOL N/A 05/10/2020    Procedure: COLONOSCOPY WITH PROPOFOL;  Surgeon: Toney Reil, MD;  Location: [facility] ENDOSCOPY;  Service: Gastroenterology;  Laterality: N/A;   COLONOSCOPY WITH PROPOFOL N/A 05/12/2020   Procedure: COLONOSCOPY WITH PROPOFOL;  Surgeon: Toney Reil, MD;  Location: [facility] ENDOSCOPY;  Service: Gastroenterology;  Laterality: N/A;   CYSTOSCOPY W/ RETROGRADES Left 10/04/2021   Procedure: CYSTOSCOPY WITH RETROGRADE PYELOGRAM;  Surgeon: Riki Altes, MD;  Location: [facility] ORS;  Service: Urology;  Laterality: Left;   CYSTOSCOPY/URETEROSCOPY/HOLMIUM LASER/STENT PLACEMENT Right 08/14/2017   Procedure: CYSTOSCOPY/URETEROSCOPY/HOLMIUM LASER/STENT PLACEMENT;  Surgeon: Riki Altes, MD;  Location: [facility] ORS;  Service: Urology;  Laterality: Right;   CYSTOSCOPY/URETEROSCOPY/HOLMIUM LASER/STENT PLACEMENT Left 10/04/2021   Procedure: CYSTOSCOPY/URETEROSCOPY/HOLMIUM LASER/STENT PLACEMENT/ LITHOPAXY;  Surgeon: Riki Altes, MD;  Location: [facility] ORS;  Service: Urology;  Laterality: Left;   ESOPHAGOGASTRODUODENOSCOPY (EGD) WITH PROPOFOL N/A 05/12/2020   Procedure: ESOPHAGOGASTRODUODENOSCOPY (EGD) WITH PROPOFOL;  Surgeon: Toney Reil, MD;  Location: [facility] ENDOSCOPY;  Service: Gastroenterology;  Laterality: N/A;   INCISION AND DRAINAGE ABSCESS Left 07/05/2021   Procedure: INCISION AND DRAINAGE SCROTAL WALL ABSCESS;  Surgeon: Riki Altes, MD;  Location: [facility] ORS;  Service: Urology;  Laterality: Left;   leg circulation surgery Right    PROSTATE SURGERY  11/10/2008   BRACHYTHERAPY   SCROTAL EXPLORATION Left 07/05/2021   Procedure: SCROTUM EXPLORATION;  Surgeon: Riki Altes, MD;  Location: [facility] ORS;  Service: Urology;  Laterality: Left;   Family History  Problem Relation Age of Onset   Heart attack Brother    Heart disease Other    Social History   Socioeconomic History   Marital status: Single    Spouse name: Not on file   Number of children: Not on file   Years of education: Not on  file   Highest education level: Not on file  Occupational History   Not on file  Tobacco Use   Smoking status: Never   Smokeless tobacco: Never  Vaping Use   Vaping Use: Never used  Substance and Sexual Activity   Alcohol use: No    Alcohol/week: 0.0 standard drinks of alcohol   Drug use: No   Sexual activity: Not Currently  Other Topics Concern   Not on file  Social History Narrative   Lives at [facility] group home, 5 males. Moved to higher level of care 04/28/19 with Anselm Pancoast.     Social Determinants of Health   Financial Resource Strain: Low Risk  (09/29/2022)   Overall Financial Resource Strain (CARDIA)    Difficulty of Paying Living Expenses: Not hard at all  Food Insecurity: No Food Insecurity (09/29/2022)   Hunger Vital Sign    Worried About Running Out of Food in the Last Year: Never true  Ran Out of Food in the Last Year: Never true  Transportation Needs: No Transportation Needs (09/29/2022)   PRAPARE - Administrator, Civil Service (Medical): No    Lack of Transportation (Non-Medical): No  Physical Activity: Inactive (09/29/2022)   Exercise Vital Sign    Days of Exercise per Week: 0 days    Minutes of Exercise per Session: 0 min  Stress: No Stress Concern Present (09/29/2022)   [employer] of Occupational Health - Occupational Stress Questionnaire    Feeling of Stress : Not at all  Social Connections: Socially Isolated (09/29/2022)   Social Connection and Isolation Panel [NHANES]    Frequency of Communication with Friends and Family: More than three times a week    Frequency of Social Gatherings with Friends and Family: Never    Attends Religious Services: Never    Database administrator or Organizations: No    Attends Engineer, structural: Never    Marital Status: Never married    Tobacco Counseling Counseling given: Not Answered   Clinical Intake:  Pre-visit preparation completed: Yes  Pain : No/denies pain     BMI -  recorded: 19.94 Nutritional Status: BMI of 19-24  Normal Nutritional Risks: None Diabetes: No  How often do you need to have someone help you when you read instructions, pamphlets, or other written materials from your doctor or pharmacy?: 1 - Never  Diabetic?no  Interpreter Needed?: No  Comments: lives in group home Information entered by :: B.Sevana Grandinetti,LPN   Activities of Daily Living    09/29/2022   10:34 AM 09/11/2022    1:45 PM  In your present state of health, do you have any difficulty performing the following activities:  Hearing? 1 1  Vision? 0 0  Difficulty concentrating or making decisions? 1 1  Walking or climbing stairs? 1 1  Dressing or bathing? 1 1  Doing errands, shopping? 1 1  Preparing Food and eating ? N   Using the Toilet? N   In the past six months, have you accidently leaked urine? Y   Do you have problems with loss of bowel control? Y   Managing your Medications? Y   Managing your Finances? Y   Housekeeping or managing your Housekeeping? Y     Patient Care Team: Alba Cory, MD as PCP - General (Family Medicine) Antonietta Breach, MD as Consulting Physician (Psychiatry) Riki Altes, MD (Urology) Schnier, Latina Craver, MD (Vascular Surgery) Gaspar Cola, [facility] (Pharmacist) Creig Hines, MD as Consulting Physician (Oncology) Toney Reil, MD as Consulting Physician (Gastroenterology)  Indicate any recent Medical Services you may have received from other than [facility] providers in the past year (date may be approximate).     Assessment:   This is a routine wellness examination for Gurnie.  Hearing/Vision screen Hearing Screening - Comments:: Adequate hearing Vision Screening - Comments:: Adequate vision w/glasses Dr Larence Penning  Dietary issues and exercise activities discussed: Current Exercise Habits: The patient does not participate in regular exercise at present, Exercise limited by: neurologic condition(s)   Goals Addressed    None    Depression Screen    09/29/2022   10:30 AM 09/11/2022    1:45 PM 08/17/2022    9:11 AM 07/10/2022   10:50 AM 02/28/2022    3:25 PM 02/01/2022    2:14 PM 10/14/2021    2:12 PM  PHQ 2/9 Scores  PHQ - 2 Score 0  0 0 0 0 0  PHQ-  9 Score     0 0   Exception Documentation  Medical reason         Fall Risk    09/29/2022   10:29 AM 09/11/2022    1:39 PM 08/17/2022    9:10 AM 07/10/2022   10:50 AM 02/28/2022    3:18 PM  Fall Risk   Falls in the past year? 0 0 0 0 0  Number falls in past yr: 0 0 0 0 0  Injury with Fall? 0 0 0 0 0  Risk for fall due to : No Fall Risks      Follow up Education provided;Falls prevention discussed        FALL RISK PREVENTION PERTAINING TO THE HOME:  Any stairs in or around the home? No  If so, are there any without handrails? No  Home free of loose throw rugs in walkways, pet beds, electrical cords, etc? Yes  Adequate lighting in your home to reduce risk of falls? Yes   ASSISTIVE DEVICES UTILIZED TO PREVENT FALLS:  Life alert? No  Use of a cane, walker or w/c? Yes w/c Grab bars in the bathroom? Yes  Shower chair or bench in shower? Yes  Elevated toilet seat or a handicapped toilet? Yes   Cognitive Function:        Immunizations Immunization History  Administered Date(s) Administered   Fluad Quad(high Dose 65+) 03/01/2020, 02/01/2022   Influenza, High Dose Seasonal PF 02/11/2015, 02/14/2016, 02/14/2017, 02/19/2018, 01/27/2019, 01/20/2020   Influenza,inj,Quad PF,6+ Mos 02/04/2013   Influenza-Unspecified 12/31/2013, 02/11/2015   Moderna Sars-Covid-2 Vaccination 06/24/2019, 07/26/2019   Pneumococcal Conjugate-13 06/22/2014, 06/22/2014   Pneumococcal Polysaccharide-23 09/15/2009, 09/15/2009   Tdap 09/15/2009, 09/15/2009, 01/20/2020   Zoster Recombinat (Shingrix) 04/02/2020    TDAP status: Up to date  Flu Vaccine status: Up to date  Pneumococcal vaccine status: Up to date  Covid-19 vaccine status: Completed vaccines  Qualifies for  Shingles Vaccine? Yes   Zostavax completed No   Shingrix Completed?: No.    Education has been provided regarding the importance of this vaccine. Patient has been advised to call insurance company to determine out of pocket expense if they have not yet received this vaccine. Advised may also receive vaccine at local pharmacy or Health Dept. Verbalized acceptance and understanding.  Screening Tests Health Maintenance  Topic Date Due   COVID-19 Vaccine (3 - Moderna risk series) 08/23/2019   Zoster Vaccines- Shingrix (2 of 2) 12/12/2022 (Originally 05/28/2020)   INFLUENZA VACCINE  11/23/2022   Medicare Annual Wellness (AWV)  09/29/2023   DTaP/Tdap/Td (4 - Td or Tdap) 01/19/2030   Pneumonia Vaccine 34+ Years old  Completed   Hepatitis C Screening  Completed   HPV VACCINES  Aged Out    Health Maintenance  Health Maintenance Due  Topic Date Due   COVID-19 Vaccine (3 - Moderna risk series) 08/23/2019    Colorectal cancer screening: Type of screening: Colonoscopy. Completed yes. Repeat every 5 years  Lung Cancer Screening: (Low Dose CT Chest recommended if Age 68-80 years, 30 pack-year currently smoking OR have quit w/in 15years.) does not qualify.   Lung Cancer Screening Referral: no  Additional Screening:  Hepatitis C Screening: does not qualify; Completed yes  Vision Screening: Recommended annual ophthalmology exams for early detection of glaucoma and other disorders of the eye. Is the patient up to date with their annual eye exam?  Yes  Who is the provider or what is the name of the office in which the patient attends  annual eye exams? Dr Larence Penning If pt is not established with a provider, would they like to be referred to a provider to establish care? No .   Dental Screening: Recommended annual dental exams for proper oral hygiene  Community Resource Referral / Chronic Care Management: CRR required this visit?  No   CCM required this visit?  No      Plan:     I have  personally reviewed and noted the following in the patient's chart:   Medical and social history Use of alcohol, tobacco or illicit drugs  Current medications and supplements including opioid prescriptions. Patient is not currently taking opioid prescriptions. Functional ability and status Nutritional status Physical activity Advanced directives List of other physicians Hospitalizations, surgeries, and ER visits in previous 12 months Vitals Screenings to include cognitive, depression, and falls Referrals and appointments  In addition, I have reviewed and discussed with patient certain preventive protocols, quality metrics, and best practice recommendations. A written personalized care plan for preventive services as well as general preventive health recommendations were provided to patient.     Sue Lush, LPN   08/25/6642   Nurse Notes: Pt in group/care home. Pt unable to do  CIT. Most questions answered by caregiver.

## 2022-09-29 NOTE — Patient Instructions (Signed)
[name] , Thank you for taking time to come for your Medicare Wellness Visit. I appreciate your ongoing commitment to your health goals. Please review the following plan we discussed and let me know if I can assist you in the future.   These are the goals we discussed:  Goals      Track and Manage My Symptoms-Depression     Timeframe:  Long-Range Goal Priority:  High Start Date:  10/21/2020                            Expected End Date: 04/22/2022                       Follow Up within 90 days   - have a plan for how to handle bad days - spend time or talk with others at least 2 to 3 times per week - watch for early signs of feeling worse    Why is this important?   Keeping track of your progress will help your treatment team find the right mix of medicine and therapy for you.  Write in your journal every day.  Day-to-day changes in depression symptoms are normal. It may be more helpful to check your progress at the end of each week instead of every day.     Notes:         This is a list of the screening recommended for you and due dates:  Health Maintenance  Topic Date Due   COVID-19 Vaccine (3 - Moderna risk series) 08/23/2019   Zoster (Shingles) Vaccine (2 of 2) 12/12/2022*   Flu Shot  11/23/2022   Medicare Annual Wellness Visit  09/29/2023   DTaP/Tdap/Td vaccine (4 - Td or Tdap) 01/19/2030   Pneumonia Vaccine  Completed   Hepatitis C Screening  Completed   HPV Vaccine  Aged Out  *Topic was postponed. The date shown is not the original due date.    Advanced directives: yes  Conditions/risks identified: high falls risk  Next appointment: Follow up in one year for your annual wellness visit. 10/05/2023 @ 10:15am telephone  Preventive Care 65 Years and Older, Male  Preventive care refers to lifestyle choices and visits with your health care provider that can promote health and wellness. What does preventive care include? A yearly physical exam. This is also  called an annual well check. Dental exams once or twice a year. Routine eye exams. Ask your health care provider how often you should have your eyes checked. Personal lifestyle choices, including: Daily care of your teeth and gums. Regular physical activity. Eating a healthy diet. Avoiding tobacco and drug use. Limiting alcohol use. Practicing safe sex. Taking low doses of aspirin every day. Taking vitamin and mineral supplements as recommended by your health care provider. What happens during an annual well check? The services and screenings done by your health care provider during your annual well check will depend on your age, overall health, lifestyle risk factors, and family history of disease. Counseling  Your health care provider may ask you questions about your: Alcohol use. Tobacco use. Drug use. Emotional well-being. Home and relationship well-being. Sexual activity. Eating habits. History of falls. Memory and ability to understand (cognition). Work and work Astronomer. Screening  You may have the following tests or measurements: Height, weight, and BMI. Blood pressure. Lipid and cholesterol levels. These may be checked every 5 years, or more frequently if you are  over 4 years old. Skin check. Lung cancer screening. You may have this screening every year starting at age 77 if you have a 30-pack-year history of smoking and currently smoke or have quit within the past 15 years. Fecal occult blood test (FOBT) of the stool. You may have this test every year starting at age 75. Flexible sigmoidoscopy or colonoscopy. You may have a sigmoidoscopy every 5 years or a colonoscopy every 10 years starting at age 69. Prostate cancer screening. Recommendations will vary depending on your family history and other risks. Hepatitis C blood test. Hepatitis B blood test. Sexually transmitted disease (STD) testing. Diabetes screening. This is done by checking your blood sugar (glucose)  after you have not eaten for a while (fasting). You may have this done every 1-3 years. Abdominal aortic aneurysm (AAA) screening. You may need this if you are a current or former smoker. Osteoporosis. You may be screened starting at age 14 if you are at high risk. Talk with your health care provider about your test results, treatment options, and if necessary, the need for more tests. Vaccines  Your health care provider may recommend certain vaccines, such as: Influenza vaccine. This is recommended every year. Tetanus, diphtheria, and acellular pertussis (Tdap, Td) vaccine. You may need a Td booster every 10 years. Zoster vaccine. You may need this after age 32. Pneumococcal 13-valent conjugate (PCV13) vaccine. One dose is recommended after age 12. Pneumococcal polysaccharide (PPSV23) vaccine. One dose is recommended after age 70. Talk to your health care provider about which screenings and vaccines you need and how often you need them. This information is not intended to replace advice given to you by your health care provider. Make sure you discuss any questions you have with your health care provider. Document Released: 05/07/2015 Document Revised: 12/29/2015 Document Reviewed: 02/09/2015 Elsevier Interactive Patient Education  2017 [employer].  Fall Prevention in the Home Falls can cause injuries. They can happen to people of all ages. There are many things you can do to make your home safe and to help prevent falls. What can I do on the outside of my home? Regularly fix the edges of walkways and driveways and fix any cracks. Remove anything that might make you trip as you walk through a door, such as a raised step or threshold. Trim any bushes or trees on the path to your home. Use bright outdoor lighting. Clear any walking paths of anything that might make someone trip, such as rocks or tools. Regularly check to see if handrails are loose or broken. Make sure that both sides of any  steps have handrails. Any raised decks and porches should have guardrails on the edges. Have any leaves, snow, or ice cleared regularly. Use sand or salt on walking paths during winter. Clean up any spills in your garage right away. This includes oil or grease spills. What can I do in the bathroom? Use night lights. Install grab bars by the toilet and in the tub and shower. Do not use towel bars as grab bars. Use non-skid mats or decals in the tub or shower. If you need to sit down in the shower, use a plastic, non-slip stool. Keep the floor dry. Clean up any water that spills on the floor as soon as it happens. Remove soap buildup in the tub or shower regularly. Attach bath mats securely with double-sided non-slip rug tape. Do not have throw rugs and other things on the floor that can make you trip. What  can I do in the bedroom? Use night lights. Make sure that you have a light by your bed that is easy to reach. Do not use any sheets or blankets that are too big for your bed. They should not hang down onto the floor. Have a firm chair that has side arms. You can use this for support while you get dressed. Do not have throw rugs and other things on the floor that can make you trip. What can I do in the kitchen? Clean up any spills right away. Avoid walking on wet floors. Keep items that you use a lot in easy-to-reach places. If you need to reach something above you, use a strong step stool that has a grab bar. Keep electrical cords out of the way. Do not use floor polish or wax that makes floors slippery. If you must use wax, use non-skid floor wax. Do not have throw rugs and other things on the floor that can make you trip. What can I do with my stairs? Do not leave any items on the stairs. Make sure that there are handrails on both sides of the stairs and use them. Fix handrails that are broken or loose. Make sure that handrails are as long as the stairways. Check any carpeting to  make sure that it is firmly attached to the stairs. Fix any carpet that is loose or worn. Avoid having throw rugs at the top or bottom of the stairs. If you do have throw rugs, attach them to the floor with carpet tape. Make sure that you have a light switch at the top of the stairs and the bottom of the stairs. If you do not have them, ask someone to add them for you. What else can I do to help prevent falls? Wear shoes that: Do not have high heels. Have rubber bottoms. Are comfortable and fit you well. Are closed at the toe. Do not wear sandals. If you use a stepladder: Make sure that it is fully opened. Do not climb a closed stepladder. Make sure that both sides of the stepladder are locked into place. Ask someone to hold it for you, if possible. Clearly mark and make sure that you can see: Any grab bars or handrails. First and last steps. Where the edge of each step is. Use tools that help you move around (mobility aids) if they are needed. These include: Canes. Walkers. Scooters. Crutches. Turn on the lights when you go into a dark area. Replace any light bulbs as soon as they burn out. Set up your furniture so you have a clear path. Avoid moving your furniture around. If any of your floors are uneven, fix them. If there are any pets around you, be aware of where they are. Review your medicines with your doctor. Some medicines can make you feel dizzy. This can increase your chance of falling. Ask your doctor what other things that you can do to help prevent falls. This information is not intended to replace advice given to you by your health care provider. Make sure you discuss any questions you have with your health care provider. Document Released: 02/04/2009 Document Revised: 09/16/2015 Document Reviewed: 05/15/2014 Elsevier Interactive Patient Education  2017 [employer].

## 2022-10-04 DIAGNOSIS — H2513 Age-related nuclear cataract, bilateral: Secondary | ICD-10-CM | POA: Diagnosis not present

## 2022-10-17 ENCOUNTER — Other Ambulatory Visit: Payer: Self-pay | Admitting: Family Medicine

## 2022-10-30 DIAGNOSIS — H53043 Amblyopia suspect, bilateral: Secondary | ICD-10-CM | POA: Diagnosis not present

## 2022-10-30 DIAGNOSIS — H25813 Combined forms of age-related cataract, bilateral: Secondary | ICD-10-CM | POA: Diagnosis not present

## 2022-10-30 DIAGNOSIS — H5005 Alternating esotropia: Secondary | ICD-10-CM | POA: Diagnosis not present

## 2022-11-17 ENCOUNTER — Other Ambulatory Visit: Payer: Self-pay | Admitting: Family Medicine

## 2022-11-22 ENCOUNTER — Other Ambulatory Visit: Payer: Self-pay | Admitting: Family Medicine

## 2022-11-22 DIAGNOSIS — E441 Mild protein-calorie malnutrition: Secondary | ICD-10-CM

## 2022-11-30 ENCOUNTER — Ambulatory Visit: Payer: 59 | Admitting: Urology

## 2022-12-12 NOTE — Progress Notes (Unsigned)
Name: [name]   MRN: [id number]    DOB: 1941/04/18   Date:12/13/2022       Progress Note  Subjective  Chief Complaint  Follow Up  He is today with two caregivers - [name], [name]   HPI  Atherosclerosis of aorta/PAD: he has been compliant with Atorvastatin , no side effects, Last LDL was 37. He lives in a group home  Chronic constipation: doing well on Amitiza, bowel movements daily , [place] 6. No abdominal pain . He wears depends but stools are formed, he does not complain of abdominal pain   MDD: he is doing much better, he likes the new group home, taking zoloft, he is no longer getting agitated and  has not taken alprazolam in over one year - we will give him only 2 pills to take prn now   Cerebral Palsy/ history of polio : lives in a group home - he came in today with [name] - he is wheelchair bound, unable to transfer, he also has some atrophy of  both hands. He is having assistance to eat , due to dropping his spoon - seems to have helped him gain weight. He needs help bathing , transferring, transportation, medication taking ,he has urine and bowel incontinent and wears depends , needs assistance eating. Pressure sores resolved released by the wound center.   Anemia: history of iron deficiency anemia due to rectal bleeding but doing well lately, ,last b12 and folic also back to normal Anemia is mild and chronic now, last ferritin also normal. No recent episodes of rectal bleeding   Urinary frequency: seeing Urologist   Patient Active Problem List   Diagnosis Date Noted   Nephrolithiasis 02/01/2022   Pressure injury of left heel, stage 2 (HCC) 02/01/2022   Atherosclerosis of aorta (HCC) 02/01/2022   History of iron deficiency anemia 02/01/2022   Urinary incontinence 05/26/2021   Hypotension 05/20/2021   History of rectal bleeding 05/09/2020   Hematochezia    BPH (benign prostatic hyperplasia)    Hyperlipidemia    Depression with anxiety    Personal  history of urinary calculi 06/21/2019   PAD (peripheral artery disease) (HCC) 07/04/2017   Aortic valve calcification 07/04/2017   Coronary artery calcification 07/04/2017   Late effect acute polio 05/17/2016   Chronic constipation 11/19/2015   Mild major depression (HCC) 11/19/2015   Bacteriuria, chronic 09/11/2015   History of prostate cancer 03/02/2015   Amyotrophia 03/02/2015   Edema leg 03/02/2015   Cerebral palsy (HCC) 03/02/2015   Dyslipidemia 03/02/2015   Dermatitis, eczematoid 03/02/2015   Divergent squint 03/02/2015   H/O acute poliomyelitis 03/02/2015   Lymphedema 03/02/2015   Intellectual disability 03/02/2015   Hypertrophy of nail 03/02/2015   Chronic venous insufficiency 03/02/2015   Avitaminosis D 03/02/2015   Adynamia 03/02/2015   Dependent on wheelchair 03/02/2015   Renal cysts, acquired, bilateral 03/02/2015   At risk for falling 03/02/2015   Cerebral vascular disease 03/02/2015   Strabismus 02/11/2015    Past Surgical History:  Procedure Laterality Date   COLONOSCOPY WITH PROPOFOL N/A 05/10/2020   Procedure: COLONOSCOPY WITH PROPOFOL;  Surgeon: Toney Reil, MD;  Location: [facility] ENDOSCOPY;  Service: Gastroenterology;  Laterality: N/A;   COLONOSCOPY WITH PROPOFOL N/A 05/12/2020   Procedure: COLONOSCOPY WITH PROPOFOL;  Surgeon: Toney Reil, MD;  Location: [facility] ENDOSCOPY;  Service: Gastroenterology;  Laterality: N/A;   CYSTOSCOPY W/ RETROGRADES Left 10/04/2021   Procedure: CYSTOSCOPY WITH RETROGRADE PYELOGRAM;  Surgeon: Riki Altes, MD;  Location: [facility] ORS;  Service: Urology;  Laterality: Left;   CYSTOSCOPY/URETEROSCOPY/HOLMIUM LASER/STENT PLACEMENT Right 08/14/2017   Procedure: CYSTOSCOPY/URETEROSCOPY/HOLMIUM LASER/STENT PLACEMENT;  Surgeon: Riki Altes, MD;  Location: [facility] ORS;  Service: Urology;  Laterality: Right;   CYSTOSCOPY/URETEROSCOPY/HOLMIUM LASER/STENT PLACEMENT Left 10/04/2021   Procedure: CYSTOSCOPY/URETEROSCOPY/HOLMIUM  LASER/STENT PLACEMENT/ LITHOPAXY;  Surgeon: Riki Altes, MD;  Location: [facility] ORS;  Service: Urology;  Laterality: Left;   ESOPHAGOGASTRODUODENOSCOPY (EGD) WITH PROPOFOL N/A 05/12/2020   Procedure: ESOPHAGOGASTRODUODENOSCOPY (EGD) WITH PROPOFOL;  Surgeon: Toney Reil, MD;  Location[facility] ENDOSCOPY;  Service: Gastroenterology;  Laterality: N/A;   INCISION AND DRAINAGE ABSCESS Left 07/05/2021   Procedure: INCISION AND DRAINAGE SCROTAL WALL ABSCESS;  Surgeon: Riki Altes, MD;  Location: [facility] ORS;  Service: Urology;  Laterality: Left;   leg circulation surgery Right    PROSTATE SURGERY  11/10/2008   BRACHYTHERAPY   SCROTAL EXPLORATION Left 07/05/2021   Procedure: SCROTUM EXPLORATION;  Surgeon: Riki Altes, MD;  Location: [facility] ORS;  Service: Urology;  Laterality: Left;    Family History  Problem Relation Age of Onset   Heart attack Brother    Heart disease Other     Social History   Tobacco Use   Smoking status: Never   Smokeless tobacco: Never  Substance Use Topics   Alcohol use: No    Alcohol/week: 0.0 standard drinks of alcohol     Current Outpatient Medications:    acetaminophen (TYLENOL) 325 MG tablet, TAKE 2 TABLETS (650 MG TOTAL) BY MOUTH EVERY 6 HOURS AS NEEDED FOR MILD PAIN OR HEADACHE., Disp: 60 tablet, Rfl: 5   augmented betamethasone dipropionate (DIPROLENE-AF) 0.05 % cream, APPLY TOPICALLY AS DIRECTED TO RASH AS NEEDED, Disp: 45 g, Rfl: 0   CALCIUM + VITAMIN D3 600-10 MG-MCG TABS, TAKE 1 TABLET BY MOUTH TWICE DAILY., Disp: 60 tablet, Rfl: 0   Cranberry Juice Powder 425 MG CAPS, TAKE 1 CAPSULE BY MOUTH ONCE DAILY, Disp: 90 capsule, Rfl: 1   Elastic Bandages & Supports (MEDICAL COMPRESSION STOCKINGS) MISC, 1 Units by Does not apply route daily., Disp: 2 each, Rfl: 0   GNP VITAMIN C 500 MG tablet, TAKE ONE TABLET BY MOUTH ONCE DAILY, Disp: 100 tablet, Rfl: 1   Incontinence Supply Disposable (DEPEND SILHOUETTE BRIEFS L/XL) MISC, 1 each by Does not apply  route 2 (two) times daily as needed., Disp: 60 each, Rfl: 5   Nutritional Supplements (ENSURE ORIGINAL) LIQD, DRINK 1 BOTTLE BY MOUTH 3 TIMES DAILY, Disp: 237 mL, Rfl: 5   nystatin cream (MYCOSTATIN), APPLY 1 APPLICATION TOPICALLY 2 TIMES DAILY, Disp: 30 g, Rfl: 0   Polyethyl Glycol-Propyl Glycol (SYSTANE ULTRA OP), Place 1 drop into both eyes 2 (two) times daily as needed (dry eyes)., Disp: , Rfl:    alfuzosin (UROXATRAL) 10 MG 24 hr tablet, Take 1 tablet (10 mg total) by mouth daily., Disp: 30 tablet, Rfl: 6   atorvastatin (LIPITOR) 40 MG tablet, Take 1 tablet (40 mg total) by mouth every evening., Disp: 30 tablet, Rfl: 6   lubiprostone (AMITIZA) 24 MCG capsule, Take 1 capsule (24 mcg total) by mouth 2 (two) times daily with a meal., Disp: 60 capsule, Rfl: 6   sertraline (ZOLOFT) 100 MG tablet, Take 1 tablet (100 mg total) by mouth daily., Disp: 30 tablet, Rfl: 6  No Known Allergies  I personally reviewed active problem list, medication list, allergies, family history, social history, health maintenance with the patient/caregiver today.   ROS   Ten systems reviewed and is negative  except as mentioned in HPI    Objective  Vitals:   12/13/22 1002  BP: 122/72  Pulse: 60  Resp: 18  Temp: 97.9 F (36.6 C)  TempSrc: Oral  SpO2: 98%  Weight: 136 lb 1.6 oz (61.7 kg)    Body mass index is 20.1 kg/m.  Physical Exam  Constitutional: Patient appears well-developed and well-nourished.  No distress.  HEENT: head atraumatic, normocephalic, pupils equal and reactive to light, neck supple Cardiovascular: Normal rate, regular rhythm and normal heart sounds.  No murmur heard. No BLE edema. Pulmonary/Chest: Effort normal and breath sounds normal. No respiratory distress. Abdominal: Soft.  There is no tenderness. Muscular skeletal: atrophy of both hands, hand contracture able to open right hand slowly, also atrophy of both legs  Psychiatric: Patient has a normal mood and affect. behavior is  normal.    PHQ2/9:    09/29/2022   10:30 AM 08/17/2022    9:11 AM 07/10/2022   10:50 AM 02/28/2022    3:25 PM 02/01/2022    2:14 PM  Depression screen PHQ 2/9  Decreased Interest 0 0 0 0 0  Down, Depressed, Hopeless  0 0 0 0  PHQ - 2 Score 0 0 0 0 0  Altered sleeping    0 0  Tired, decreased energy    0 0  Change in appetite    0 0  Feeling bad or failure about yourself     0 0  Trouble concentrating    0 0  Moving slowly or fidgety/restless    0 0  Suicidal thoughts    0 0  PHQ-9 Score    0 0  Difficult doing work/chores    Not difficult at all     phq 9 is unable to complete but seems well    Fall Risk:    12/13/2022   10:05 AM 09/29/2022   10:29 AM 09/11/2022    1:39 PM 08/17/2022    9:10 AM 07/10/2022   10:50 AM  Fall Risk   Falls in the past year? 0 0 0 0 0  Number falls in past yr:  0 0 0 0  Injury with Fall?  0 0 0 0  Risk for fall due to : Impaired mobility No Fall Risks     Follow up Falls prevention discussed;Education provided;Falls evaluation completed Education provided;Falls prevention discussed       Assessment & Plan  1. Mild major depression (HCC)  - sertraline (ZOLOFT) 100 MG tablet; Take 1 tablet (100 mg total) by mouth daily.  Dispense: 30 tablet; Refill: 6  2. Chronic constipation  - lubiprostone (AMITIZA) 24 MCG capsule; Take 1 capsule (24 mcg total) by mouth 2 (two) times daily with a meal.  Dispense: 60 capsule; Refill: 6  3. Atherosclerosis of aorta (HCC)  - atorvastatin (LIPITOR) 40 MG tablet; Take 1 tablet (40 mg total) by mouth every evening.  Dispense: 30 tablet; Refill: 6  4. Urinary frequency  - alfuzosin (UROXATRAL) 10 MG 24 hr tablet; Take 1 tablet (10 mg total) by mouth daily.  Dispense: 30 tablet; Refill: 6   5. Anxiety  - ALPRAZolam (XANAX) 0.25 MG tablet; Take 1 tablet (0.25 mg total) by mouth daily as needed (agitation (crisis med)).  Dispense: 2 tablet; Refill: 0

## 2022-12-13 ENCOUNTER — Telehealth: Payer: Self-pay | Admitting: *Deleted

## 2022-12-13 ENCOUNTER — Ambulatory Visit (INDEPENDENT_AMBULATORY_CARE_PROVIDER_SITE_OTHER): Payer: 59 | Admitting: Family Medicine

## 2022-12-13 ENCOUNTER — Encounter: Payer: Self-pay | Admitting: Family Medicine

## 2022-12-13 DIAGNOSIS — F419 Anxiety disorder, unspecified: Secondary | ICD-10-CM

## 2022-12-13 DIAGNOSIS — I700 Atherosclerosis of aorta: Secondary | ICD-10-CM | POA: Diagnosis not present

## 2022-12-13 DIAGNOSIS — F320 Major depressive disorder, single episode, mild: Secondary | ICD-10-CM | POA: Diagnosis not present

## 2022-12-13 DIAGNOSIS — K5909 Other constipation: Secondary | ICD-10-CM | POA: Diagnosis not present

## 2022-12-13 DIAGNOSIS — R350 Frequency of micturition: Secondary | ICD-10-CM | POA: Diagnosis not present

## 2022-12-13 MED ORDER — ALPRAZOLAM 0.25 MG PO TABS: 0.2500 mg | ORAL_TABLET | Freq: Every day | ORAL | 0 refills | Status: DC | PRN

## 2022-12-13 MED ORDER — ATORVASTATIN CALCIUM 40 MG PO TABS
40.0000 mg | ORAL_TABLET | Freq: Every evening | ORAL | 6 refills | Status: DC
Start: 2022-12-13 — End: 2023-07-13

## 2022-12-13 MED ORDER — SERTRALINE HCL 100 MG PO TABS: 100.0000 mg | ORAL_TABLET | Freq: Every day | ORAL | 6 refills | Status: DC

## 2022-12-13 MED ORDER — ALFUZOSIN HCL ER 10 MG PO TB24: 10.0000 mg | ORAL_TABLET | Freq: Every day | ORAL | 6 refills | Status: DC

## 2022-12-13 MED ORDER — LUBIPROSTONE 24 MCG PO CAPS: 24.0000 ug | ORAL_CAPSULE | Freq: Two times a day (BID) | ORAL | 6 refills | Status: DC

## 2022-12-13 NOTE — Progress Notes (Deleted)
12/14/2022 11:22 AM   [name] 1940/06/26 [id number]  Referring provider: Alba Cory, MD [address] [place],  [place] [address]  Urological history: 1.  History T1c intermediate risk prostate cancer -Gleason 3+4 diagnosed 2010 -Treated with brachytherapy 11/10/2008 -Last PSA 10/2015 <0.1   2.  Chronic bacteriuria -Cystoscopy Dr. Edwyna Shell 2015 unremarkable, nonocclusive prostate or stricture   3.  Stone disease - CT 2019 nonobstructing right mid ureteral calculus -Ureteroscopic stone removal 08/14/2016 -Left URS (10/19/2021)  -CT (12/2021) - Multiple nonobstructive renal calculi on the right measure up to 2.2 x 1.6 cm and are similar to 09/22/2021. No renal calculi on the left. No hydronephrosis on either side -RUS (04/2022) - no hydronephrosis    4.  Renal cysts -CTU 2019 bilateral renal cysts with 2 indeterminate right renal lesions (18 mm interpolar left kidney and 15 mm anterior right kidney) -MRI 07/2017 showed the left renal lesion was not present and was a perfusion anomaly; right renal lesion a Bosniak 2 cyst -CTU 2022 - There is 1.9 cm smooth marginated low-density lesion in the midportion of left kidney. There is 2.2 cm cyst in the anterior midportion of right kidney. There is 2.3 cm exophytic lesion in the anterior midportion of right kidney, possibly cyst  -CT renal stone study (12/2021) - Bilateral renal cysts measure up to 2.2 cm on the right and 2.4 cm on the left -RUS (04/2022) - 2 cysts are identified in the lower pole the right kidney with the largest measuring 2.6 cm - 2.6 cm cyst is identified the left kidney   HPI: [name] is a 82 y.o. male who presents today for follow up for renal stones.   History given by staff.   RUS did not demonstrates any hydronephrosis.    He has not complained of any renal colic or had any passage of fragments.  There have been no signs of infection or complaints of scrotal pain.    Patient  denies any modifying or aggravating factors.  Patient denies any gross hematuria, dysuria or suprapubic/flank pain.  Patient denies any fevers, chills, nausea or vomiting.     PMH: Past Medical History:  Diagnosis Date   Anxiety    Atrophy, disuse, muscle    BPH (benign prostatic hyperplasia)    Cerebral palsy (HCC)    Eczema    Elevated lipids    Elevated PSA    Exotropia    History of kidney stones    Hyperlipidemia    Kidney stone    Leukopenia    Lower extremity edema    lymphedema   Mental retardation    Over weight    Prostate cancer (HCC)    Venous insufficiency    Vitamin D deficiency    Wheelchair dependence     Surgical History: Past Surgical History:  Procedure Laterality Date   COLONOSCOPY WITH PROPOFOL N/A 05/10/2020   Procedure: COLONOSCOPY WITH PROPOFOL;  Surgeon: Toney Reil, MD;  Location: [facility] ENDOSCOPY;  Service: Gastroenterology;  Laterality: N/A;   COLONOSCOPY WITH PROPOFOL N/A 05/12/2020   Procedure: COLONOSCOPY WITH PROPOFOL;  Surgeon: Toney Reil, MD;  Location: [facility] ENDOSCOPY;  Service: Gastroenterology;  Laterality: N/A;   CYSTOSCOPY W/ RETROGRADES Left 10/04/2021   Procedure: CYSTOSCOPY WITH RETROGRADE PYELOGRAM;  Surgeon: Riki Altes, MD;  Location: [facility] ORS;  Service: Urology;  Laterality: Left;   CYSTOSCOPY/URETEROSCOPY/HOLMIUM LASER/STENT PLACEMENT Right 08/14/2017   Procedure: CYSTOSCOPY/URETEROSCOPY/HOLMIUM LASER/STENT PLACEMENT;  Surgeon: Riki Altes, MD;  Location: [facility] ORS;  Service: Urology;  Laterality: Right;   CYSTOSCOPY/URETEROSCOPY/HOLMIUM LASER/STENT PLACEMENT Left 10/04/2021   Procedure: CYSTOSCOPY/URETEROSCOPY/HOLMIUM LASER/STENT PLACEMENT/ LITHOPAXY;  Surgeon: Riki Altes, MD;  Location: [facility] ORS;  Service: Urology;  Laterality: Left;   ESOPHAGOGASTRODUODENOSCOPY (EGD) WITH PROPOFOL N/A 05/12/2020   Procedure: ESOPHAGOGASTRODUODENOSCOPY (EGD) WITH PROPOFOL;  Surgeon: Toney Reil, MD;  Location:  [facility] ENDOSCOPY;  Service: Gastroenterology;  Laterality: N/A;   INCISION AND DRAINAGE ABSCESS Left 07/05/2021   Procedure: INCISION AND DRAINAGE SCROTAL WALL ABSCESS;  Surgeon: Riki Altes, MD;  Location: [facility] ORS;  Service: Urology;  Laterality: Left;   leg circulation surgery Right    PROSTATE SURGERY  11/10/2008   BRACHYTHERAPY   SCROTAL EXPLORATION Left 07/05/2021   Procedure: SCROTUM EXPLORATION;  Surgeon: Riki Altes, MD;  Location: [facility] ORS;  Service: Urology;  Laterality: Left;    Home Medications:  Allergies as of 12/14/2022   No Known Allergies      Medication List        Accurate as of December 13, 2022 11:22 AM. If you have any questions, ask your nurse or doctor.          acetaminophen 325 MG tablet Commonly known as: TYLENOL TAKE 2 TABLETS (650 MG TOTAL) BY MOUTH EVERY 6 HOURS AS NEEDED FOR MILD PAIN OR HEADACHE.   alfuzosin 10 MG 24 hr tablet Commonly known as: UROXATRAL Take 1 tablet (10 mg total) by mouth daily.   ALPRAZolam 0.25 MG tablet Commonly known as: XANAX Take 1 tablet (0.25 mg total) by mouth daily as needed (agitation (crisis med)).   atorvastatin 40 MG tablet Commonly known as: LIPITOR Take 1 tablet (40 mg total) by mouth every evening.   augmented betamethasone dipropionate 0.05 % cream Commonly known as: DIPROLENE-AF APPLY TOPICALLY AS DIRECTED TO RASH AS NEEDED   Calcium + Vitamin D3 600-10 MG-MCG Tabs Generic drug: Calcium Carb-Cholecalciferol TAKE 1 TABLET BY MOUTH TWICE DAILY.   Cranberry Juice Powder 425 MG Caps TAKE 1 CAPSULE BY MOUTH ONCE DAILY   Depend Silhouette Briefs L/XL Misc 1 each by Does not apply route 2 (two) times daily as needed.   Ensure Original Liqd DRINK 1 BOTTLE BY MOUTH 3 TIMES DAILY   GNP Vitamin C 500 MG tablet Generic drug: ascorbic acid TAKE ONE TABLET BY MOUTH ONCE DAILY   lubiprostone 24 MCG capsule Commonly known as: AMITIZA Take 1 capsule (24 mcg total) by mouth 2 (two) times daily  with a meal.   Medical Compression Stockings Misc 1 Units by Does not apply route daily.   nystatin cream Commonly known as: MYCOSTATIN APPLY 1 APPLICATION TOPICALLY 2 TIMES DAILY   sertraline 100 MG tablet Commonly known as: ZOLOFT Take 1 tablet (100 mg total) by mouth daily.   SYSTANE ULTRA OP Place 1 drop into both eyes 2 (two) times daily as needed (dry eyes).        Allergies: No Known Allergies  Family History: Family History  Problem Relation Age of Onset   Heart attack Brother    Heart disease Other     Social History:  reports that he has never smoked. He has never used smokeless tobacco. He reports that he does not drink alcohol and does not use drugs.  ROS: Pertinent ROS in HPI  Physical Exam: There were no vitals taken for this visit.  Constitutional:  Well nourished. Alert and oriented, No acute distress. HEENT: [place] AT, moist mucus membranes.  Trachea midline Cardiovascular: No clubbing, cyanosis,  or edema. Respiratory: Normal respiratory effort, no increased work of breathing. Neurologic: Grossly intact, no focal deficits, moving all 4 extremities.  In wheel chair.  Leg contracted.  Psychiatric: Normal mood and affect.   Laboratory Data:    Latest Ref Rng & Units 09/05/2022   10:12 AM 08/31/2022   12:06 PM 08/17/2022    9:36 AM  CMP  Glucose 70 - 99 mg/dL 409  811  914   BUN 8 - 23 mg/dL 32  33  31   Creatinine 0.61 - 1.24 mg/dL 7.82  9.56  2.13   Sodium 135 - 145 mmol/L 145  139  144   Potassium 3.5 - 5.1 mmol/L 3.6  3.6  4.0   Chloride 98 - 111 mmol/L 110  108  107   CO2 22 - 32 mmol/L 25  24  30    Calcium 8.9 - 10.3 mg/dL 9.3  8.6  9.7   Total Protein 6.1 - 8.1 g/dL   6.4   Total Bilirubin 0.2 - 1.2 mg/dL   0.5   AST 10 - 35 U/L   17   ALT 9 - 46 U/L   17   I have reviewed the labs.    Pertinent Imaging: Narrative & Impression  CLINICAL DATA:  Kidney stone.   EXAM: RENAL / URINARY TRACT ULTRASOUND COMPLETE   COMPARISON:  CT of the  abdomen and pelvis without contrast January 06, 2022   FINDINGS: Right Kidney:   Renal measurements: 10 x 6.8 x 5.7 cm = volume: 206 mL. Diffuse increased echogenicity. At least 2 cysts are identified in the lower pole the right kidney with the largest measuring 2.6 cm. No follow-up imaging recommended for the cysts.   Left Kidney:   Renal measurements: 10.9 x 6.5 x 5.4 cm = volume: 198.4 mL. Diffuse increased echogenicity. A 2.6 cm cyst is identified the left kidney. No follow-up imaging recommended for the cyst.   Bladder:   The bladder wall is thickened and trabecular limiting evaluation for small masses. The finding is similar in attention since the comparison CT.   Other:   None.   IMPRESSION: 1. The bladder wall is thickened and trabeculated limiting evaluation for small masses. The finding is similar in appearance to the comparison CT. 2. Medical renal disease with diffuse increased echogenicity in both kidneys.     Electronically Signed   By: Gerome Sam III M.D.   On: 05/05/2022 07:36  I have independently reviewed the films.  See HPI.    Assessment & Plan:    1. Right UPJ stone -RUS did not demonstrate any hydronephrosis -serum creatinine is normal -will continue surveillance -RTC in 6 months for repeat RUS   -These notes generated with voice recognition software. I apologize for typographical errors.  Cloretta Ned  [facility] [address]  [address] [place], [place] [address] [phone]

## 2022-12-13 NOTE — Patient Instructions (Addendum)
New rx of alprazolam for 2 pills only to take prn agitation that does not resolve with re-direction Discontinue iron supplementation Discontinue artificial tears   Return in March of 2025 for follow up

## 2022-12-13 NOTE — Telephone Encounter (Signed)
Called and spoke with legal guardian coordinator [name] and discussed that radiology has been trying to contact to schedule RUS. Per [facility] Link pt is wheelchair bound and does not want to do anything about the stone. He will let the " house manager" know about this and will call our office. I explained that I will cancel his appt tomorrow with shannon. Pt lives at [facility].

## 2022-12-14 ENCOUNTER — Ambulatory Visit: Payer: 59 | Admitting: Urology

## 2022-12-18 ENCOUNTER — Other Ambulatory Visit: Payer: Self-pay | Admitting: Family Medicine

## 2022-12-18 DIAGNOSIS — N390 Urinary tract infection, site not specified: Secondary | ICD-10-CM

## 2023-01-12 ENCOUNTER — Other Ambulatory Visit: Payer: Self-pay | Admitting: Family Medicine

## 2023-02-08 ENCOUNTER — Other Ambulatory Visit: Payer: Self-pay | Admitting: Family Medicine

## 2023-03-08 ENCOUNTER — Other Ambulatory Visit: Payer: Self-pay | Admitting: Family Medicine

## 2023-03-14 ENCOUNTER — Ambulatory Visit: Payer: Self-pay | Admitting: *Deleted

## 2023-03-14 NOTE — Telephone Encounter (Signed)
Appt made for Thursday 03-15-2023

## 2023-03-14 NOTE — Telephone Encounter (Signed)
  Chief Complaint: Choking Symptoms: None presently Frequency: This AM Pertinent Negatives: Patient denies  Disposition: [] ED /[] Urgent Care (no appt availability in office) / [] Appointment(In office/virtual)/ []  [facility]/ [] Home Care/ [] Refused Recommended Disposition /[] [facility]/ [x]  Follow-up with PCP Additional Notes:  Caregiver states pt choked while being fed at day program he attends. States staff did Heimlich.Pt is on pureed diet. Home caregiver [name] calling. Questioning if pt should be seen. States no issues presently . "He holds food in his mouth and I think they kept putting it in his mouth and he choked." States they are now aware. Please advise if pt needs appt. Marigene Ehlers: [phone]   Reason for Disposition  [1] Coughing spells AND [2] occur during eating/feedings or within 2 hours  Answer Assessment - Initial Assessment Questions 1. DESCRIPTION: "Tell me more about this problem." "Are you  having trouble swallowing liquids, solids, or both?" "Any trouble with swallowing saliva (spit)?"     Being fed at day program, choked       4. CAUSE: "What do you think is causing the problem?"  (e.g., dry mouth, food or pill stuck in throat, mouth pain, sore throat, progression of disease process such as dementia or Parkinson's disease).      "Holds food in his mouth" 5. CHRONIC or RECURRENT: "Is this a new problem for you?"  If No, ask: "How long have you had this problem?" (e.g., days, weeks, months)      On pureed diet 6. OTHER SYMPTOMS: "Do you have any other symptoms?" (e.g., chest pain, difficulty breathing, mouth sores, sore throat, swollen tongue, chest pain)     no  Protocols used: Swallowing Difficulty-A-AH

## 2023-03-15 ENCOUNTER — Encounter: Payer: Self-pay | Admitting: Nurse Practitioner

## 2023-03-15 ENCOUNTER — Ambulatory Visit (INDEPENDENT_AMBULATORY_CARE_PROVIDER_SITE_OTHER): Payer: 59 | Admitting: Nurse Practitioner

## 2023-03-15 ENCOUNTER — Other Ambulatory Visit: Payer: Self-pay

## 2023-03-15 VITALS — BP 126/74 | HR 64 | Temp 97.9°F | Resp 14 | Ht 70.0 in | Wt 158.0 lb

## 2023-03-15 DIAGNOSIS — R1319 Other dysphagia: Secondary | ICD-10-CM

## 2023-03-15 DIAGNOSIS — R0989 Other specified symptoms and signs involving the circulatory and respiratory systems: Secondary | ICD-10-CM

## 2023-03-15 NOTE — Progress Notes (Signed)
BP 126/74   Pulse 64   Temp 97.9 F (36.6 C) (Oral)   Resp 14   Ht 5\' 10"  (1.778 m)   Wt 158 lb (71.7 kg)   SpO2 95%   BMI 22.67 kg/m    Subjective:    Patient ID: [name], male    DOB: 1940-10-29, 82 y.o.   MRN: [id number]  HPI: [name] is a 82 y.o. male  Chief Complaint  Patient presents with   Cough    Choking due to holding his food in his mouth   Discussed the use of AI scribe software for clinical note transcription with the patient, who gave verbal consent to proceed.  History of Present Illness   The patient, with a history of cerebral palsy and polio, presented with a recent choking incident at his day program. The caregiver reported that the patient has a habit of holding food in his mouth, which may have contributed to the incident. The patient was found to have food lodged at the back of his mouth, leading to a choking episode. The caregiver had to intervene by hitting the patient's chest to help dislodge the food. This is the first reported incident of choking. The patient has since been able to eat without further incidents. [name] weight has increased by 22 pounds since the last visit, indicating good nutritional status.    Relevant past medical, surgical, family and social history reviewed and updated as indicated. Interim medical history since our last visit reviewed. Allergies and medications reviewed and updated.  Review of Systems  Constitutional: Negative for fever or weight change.  Respiratory: Negative for cough and shortness of breath.   Cardiovascular: Negative for chest pain or palpitations.  Gastrointestinal: Negative for abdominal pain, no bowel changes.  Musculoskeletal: Negative for gait problem or joint swelling.  Skin: Negative for rash.  Neurological: Negative for dizziness or headache.  No other specific complaints in a complete review of systems (except as listed in HPI above).      Objective:    BP 126/74    Pulse 64   Temp 97.9 F (36.6 C) (Oral)   Resp 14   Ht 5\' 10"  (1.778 m)   Wt 158 lb (71.7 kg)   SpO2 95%   BMI 22.67 kg/m   Wt Readings from Last 3 Encounters:  03/15/23 158 lb (71.7 kg)  12/13/22 136 lb 1.6 oz (61.7 kg)  09/29/22 135 lb (61.2 kg)    Physical Exam  Constitutional: Patient appears well-developed and well-nourished.  No distress.  HEENT: head atraumatic, normocephalic, pupils equal and reactive to light, neck supple, throat within normal limits Cardiovascular: Normal rate, regular rhythm and normal heart sounds.  No murmur heard. No BLE edema. Pulmonary/Chest: Effort normal and breath sounds normal. No respiratory distress. Abdominal: Soft.  There is no tenderness. Psychiatric: Patient has a normal mood and affect. behavior is normal. Judgment and thought content normal.   Results for orders placed or performed during the hospital encounter of 09/05/22  CBC  Result Value Ref Range   WBC 11.5 (H) 4.0 - 10.5 K/uL   RBC 3.67 (L) 4.22 - 5.81 MIL/uL   Hemoglobin 12.4 (L) 13.0 - 17.0 g/dL   HCT 30.8 (L) 65.7 - 84.6 %   MCV 100.5 (H) 80.0 - 100.0 fL   MCH 33.8 26.0 - 34.0 pg   MCHC 33.6 30.0 - 36.0 g/dL   RDW 96.2 95.2 - 84.1 %   Platelets 155 150 -  400 K/uL   nRBC 0.0 0.0 - 0.2 %  Basic metabolic panel  Result Value Ref Range   Sodium 145 135 - 145 mmol/L   Potassium 3.6 3.5 - 5.1 mmol/L   Chloride 110 98 - 111 mmol/L   CO2 25 22 - 32 mmol/L   Glucose, Bld 106 (H) 70 - 99 mg/dL   BUN 32 (H) 8 - 23 mg/dL   Creatinine, Ser 1.61 0.61 - 1.24 mg/dL   Calcium 9.3 8.9 - 09.6 mg/dL   GFR, Estimated >04 >54 mL/min   Anion gap 10 5 - 15      Assessment & Plan:   Problem List Items Addressed This Visit   None Visit Diagnoses     Choking episode    -  Primary   Relevant Orders   DG SWALLOW STUDY OP MEDICARE SPEECH PATH   Esophageal dysphagia       Relevant Orders   DG SWALLOW STUDY OP MEDICARE SPEECH PATH       Assessment and Plan    Choking  episode First episode of choking while eating, with subsequent coughing. No further episodes. No current respiratory distress. Lungs clear on auscultation. Concern for aspiration risk due to history of cerebral palsy and polio, and need for assistance with feeding. -Order swallow study to assess for dysphagia and aspiration risk.  Weight gain Significant weight gain noted (22 pounds since August 2024). No concerns expressed regarding this weight gain. -Monitor weight.  General Health Maintenance / Followup Plans -Continue current care plan in group home setting. -Report any further choking or coughing episodes immediately.         Follow up plan: Return if symptoms worsen or fail to improve.

## 2023-04-11 ENCOUNTER — Other Ambulatory Visit: Payer: Self-pay | Admitting: Family Medicine

## 2023-04-16 ENCOUNTER — Ambulatory Visit: Payer: Self-pay

## 2023-04-16 NOTE — Telephone Encounter (Signed)
Summary: congestion   Marigene Ehlers stated the patient is in a group home, and a few people there have colds. Pt has some congestion, not much cough, is still eating, and asking for a lot to drink. [name] asking if something can be called in.     Chief Complaint: Non-productive cough, in group home where others are coughing.No availability, asking for medication.Has tried Sudafed, what else OTC can he take. Symptoms: Above Frequency: Last week Pertinent Negatives: Patient denies fever Disposition: [] ED /[] Urgent Care (no appt availability in office) / [] Appointment(In office/virtual)/ []  [facility]/ [] Home Care/ [] Refused Recommended Disposition /[] [facility]/ [x]  Follow-up with PCP Additional Notes: Please advise guardian, Ms. Harris.  Reason for Disposition  [1] Nasal discharge AND [2] present > 10 days  Answer Assessment - Initial Assessment Questions 1. ONSET: "When did the cough begin?"      Friday 2. SEVERITY: "How bad is the cough today?"      Moderate 3. SPUTUM: "Describe the color of your sputum" (none, dry cough; clear, white, yellow, green)     No 4. HEMOPTYSIS: "Are you coughing up any blood?" If so ask: "How much?" (flecks, streaks, tablespoons, etc.)     No 5. DIFFICULTY BREATHING: "Are you having difficulty breathing?" If Yes, ask: "How bad is it?" (e.g., mild, moderate, severe)    - MILD: No SOB at rest, mild SOB with walking, speaks normally in sentences, can lie down, no retractions, pulse < 100.    - MODERATE: SOB at rest, SOB with minimal exertion and prefers to sit, cannot lie down flat, speaks in phrases, mild retractions, audible wheezing, pulse 100-120.    - SEVERE: Very SOB at rest, speaks in single words, struggling to breathe, sitting hunched forward, retractions, pulse > 120      No 6. FEVER: "Do you have a fever?" If Yes, ask: "What is your temperature, how was it measured, and when did it start?"     No 7. CARDIAC HISTORY: "Do  you have any history of heart disease?" (e.g., heart attack, congestive heart failure)      No 8. LUNG HISTORY: "Do you have any history of lung disease?"  (e.g., pulmonary embolus, asthma, emphysema)     No 9. PE RISK FACTORS: "Do you have a history of blood clots?" (or: recent major surgery, recent prolonged travel, bedridden)     No 10. OTHER SYMPTOMS: "Do you have any other symptoms?" (e.g., runny nose, wheezing, chest pain)       Runny nose 11. PREGNANCY: "Is there any chance you are pregnant?" "When was your last menstrual period?"       N/a 12. TRAVEL: "Have you traveled out of the country in the last month?" (e.g., travel history, exposures)       no  Protocols used: Cough - Acute Non-Productive-A-AH

## 2023-04-16 NOTE — Telephone Encounter (Signed)
Caregiver notified, appt schedule w/ Raynelle Fanning on 12/26 @ 3

## 2023-04-16 NOTE — Telephone Encounter (Signed)
Will need an appointment correct?

## 2023-04-19 ENCOUNTER — Encounter: Payer: Self-pay | Admitting: Nurse Practitioner

## 2023-04-19 ENCOUNTER — Ambulatory Visit (INDEPENDENT_AMBULATORY_CARE_PROVIDER_SITE_OTHER): Payer: 59 | Admitting: Nurse Practitioner

## 2023-04-19 VITALS — BP 118/72 | HR 62 | Temp 96.8°F | Resp 18 | Ht 70.0 in | Wt 158.0 lb

## 2023-04-19 DIAGNOSIS — J069 Acute upper respiratory infection, unspecified: Secondary | ICD-10-CM | POA: Diagnosis not present

## 2023-04-19 MED ORDER — GUAIFENESIN ER 600 MG PO TB12
600.0000 mg | ORAL_TABLET | Freq: Two times a day (BID) | ORAL | 0 refills | Status: DC | PRN
Start: 2023-04-19 — End: 2023-09-10

## 2023-04-19 MED ORDER — CETIRIZINE HCL 10 MG PO TABS
10.0000 mg | ORAL_TABLET | Freq: Every day | ORAL | 0 refills | Status: DC
Start: 2023-04-19 — End: 2023-05-01

## 2023-04-19 MED ORDER — BENZONATATE 100 MG PO CAPS
200.0000 mg | ORAL_CAPSULE | Freq: Two times a day (BID) | ORAL | 0 refills | Status: DC | PRN
Start: 2023-04-19 — End: 2023-09-10

## 2023-04-19 MED ORDER — FLUTICASONE PROPIONATE 50 MCG/ACT NA SUSP
2.0000 | Freq: Every day | NASAL | 0 refills | Status: DC
Start: 2023-04-19 — End: 2023-09-10

## 2023-04-19 NOTE — Progress Notes (Signed)
BP 118/72 (BP Location: Left Arm, Patient Position: Sitting, Cuff Size: Normal)   Pulse 62   Temp (!) 96.8 F (36 C) (Axillary)   Resp 18   Ht 5\' 10"  (1.778 m) Comment: per chart  Wt 158 lb (71.7 kg) Comment: per chart  SpO2 94%   BMI 22.67 kg/m    Subjective:    Patient ID: [name], male    DOB: March 12, 1941, 82 y.o.   MRN: [id number]  HPI: [name] is a 82 y.o. male  Chief Complaint  Patient presents with   URI    Discussed the use of AI scribe software for clinical note transcription with the patient, who gave verbal consent to proceed.  History of Present Illness   The patient, who lives in a group home,  presents with cough, and congestion. The symptoms began 'about the beginning of last week.' The patient has been maintaining hydration and receiving supplemental nutrition with Ensure when eating less than fifty percent of meals. The caregiver reports that the patient has been holding food in his mouth for extended periods of time when eating. The patient has been taking Sudafed for the cough and congestion. The caregiver reports that the patient has been producing sputum since starting the Sudafed. The patient has not had a fever. The caregiver reports that others in the household have been ill.       03/15/2023   10:23 AM 09/29/2022   10:30 AM 08/17/2022    9:11 AM  Depression screen PHQ 2/9  Decreased Interest 0 0 0  Down, Depressed, Hopeless 0  0  PHQ - 2 Score 0 0 0    Relevant past medical, surgical, family and social history reviewed and updated as indicated. Interim medical history since our last visit reviewed. Allergies and medications reviewed and updated.  Review of Systems  Ten systems reviewed and is negative except as mentioned in HPI      Objective:    BP 118/72 (BP Location: Left Arm, Patient Position: Sitting, Cuff Size: Normal)   Pulse 62   Temp (!) 96.8 F (36 C) (Axillary)   Resp 18   Ht 5\' 10"  (1.778 m) Comment: per chart   Wt 158 lb (71.7 kg) Comment: per chart  SpO2 94%   BMI 22.67 kg/m    Wt Readings from Last 3 Encounters:  04/19/23 158 lb (71.7 kg)  03/15/23 158 lb (71.7 kg)  12/13/22 136 lb 1.6 oz (61.7 kg)    Physical Exam  Constitutional: Patient appears well-developed and well-nourished. No distress.  HEENT: head atraumatic, normocephalic, pupils equal and reactive to light, ears TMs clear, neck supple, throat within normal limits Cardiovascular: Normal rate, regular rhythm and normal heart sounds.  No murmur heard. No BLE edema. Pulmonary/Chest: Effort normal and breath sounds normal. No respiratory distress. Abdominal: Soft.  There is no tenderness. Psychiatric: Patient has a normal mood and affect. behavior is normal. Judgment and thought content normal.  Results for orders placed or performed during the hospital encounter of 09/05/22  CBC   Collection Time: 09/05/22 10:12 AM  Result Value Ref Range   WBC 11.5 (H) 4.0 - 10.5 K/uL   RBC 3.67 (L) 4.22 - 5.81 MIL/uL   Hemoglobin 12.4 (L) 13.0 - 17.0 g/dL   HCT 87.5 (L) 64.3 - 32.9 %   MCV 100.5 (H) 80.0 - 100.0 fL   MCH 33.8 26.0 - 34.0 pg   MCHC 33.6 30.0 - 36.0 g/dL   RDW  13.7 11.5 - 15.5 %   Platelets 155 150 - 400 K/uL   nRBC 0.0 0.0 - 0.2 %  Basic metabolic panel   Collection Time: 09/05/22 10:12 AM  Result Value Ref Range   Sodium 145 135 - 145 mmol/L   Potassium 3.6 3.5 - 5.1 mmol/L   Chloride 110 98 - 111 mmol/L   CO2 25 22 - 32 mmol/L   Glucose, Bld 106 (H) 70 - 99 mg/dL   BUN 32 (H) 8 - 23 mg/dL   Creatinine, Ser 6.57 0.61 - 1.24 mg/dL   Calcium 9.3 8.9 - 84.6 mg/dL   GFR, Estimated >96 >29 mL/min   Anion gap 10 5 - 15       Assessment & Plan:   Problem List Items Addressed This Visit   None Visit Diagnoses       Viral upper respiratory tract infection    -  Primary   Relevant Medications   benzonatate (TESSALON) 100 MG capsule   guaiFENesin (MUCINEX) 600 MG 12 hr tablet   cetirizine (ZYRTEC) 10 MG tablet    fluticasone (FLONASE) 50 MCG/ACT nasal spray        Assessment and Plan    Upper Respiratory Infection   Symptoms of congestion and cough for approximately one week. No fever or shortness of breath. Lungs clear on auscultation. No signs of pneumonia.   -Start Flonase nasal spray to reduce nasal congestion.   -Start Cetirizine (Zyrtec) to alleviate symptoms.   -Start Mucinex to thin mucus and alleviate cough.   -Continue Sudafed as needed.   -Check in with the office to report progress or if symptoms worsen (fever, shortness of breath) for possible chest x-ray.        Follow up plan: Return if symptoms worsen or fail to improve.

## 2023-04-20 ENCOUNTER — Emergency Department
Admission: EM | Admit: 2023-04-20 | Discharge: 2023-04-20 | Disposition: A | Discharge status: Home / Self Care | Payer: 59 | Source: Home / Self Care | Attending: Emergency Medicine | Admitting: Emergency Medicine

## 2023-04-20 ENCOUNTER — Other Ambulatory Visit: Payer: Self-pay

## 2023-04-20 ENCOUNTER — Emergency Department: Payer: 59

## 2023-04-20 DIAGNOSIS — R404 Transient alteration of awareness: Secondary | ICD-10-CM | POA: Diagnosis not present

## 2023-04-20 DIAGNOSIS — Z1152 Encounter for screening for COVID-19: Secondary | ICD-10-CM | POA: Insufficient documentation

## 2023-04-20 DIAGNOSIS — R531 Weakness: Secondary | ICD-10-CM | POA: Diagnosis not present

## 2023-04-20 DIAGNOSIS — R509 Fever, unspecified: Secondary | ICD-10-CM | POA: Diagnosis not present

## 2023-04-20 DIAGNOSIS — J9811 Atelectasis: Secondary | ICD-10-CM | POA: Diagnosis not present

## 2023-04-20 DIAGNOSIS — Z743 Need for continuous supervision: Secondary | ICD-10-CM | POA: Diagnosis not present

## 2023-04-20 DIAGNOSIS — N3090 Cystitis, unspecified without hematuria: Secondary | ICD-10-CM | POA: Insufficient documentation

## 2023-04-20 DIAGNOSIS — R29898 Other symptoms and signs involving the musculoskeletal system: Secondary | ICD-10-CM | POA: Diagnosis not present

## 2023-04-20 DIAGNOSIS — R6889 Other general symptoms and signs: Secondary | ICD-10-CM | POA: Diagnosis not present

## 2023-04-20 DIAGNOSIS — N3000 Acute cystitis without hematuria: Secondary | ICD-10-CM | POA: Diagnosis not present

## 2023-04-20 DIAGNOSIS — A4159 Other Gram-negative sepsis: Secondary | ICD-10-CM | POA: Diagnosis not present

## 2023-04-20 LAB — URINALYSIS, W/ REFLEX TO CULTURE (INFECTION SUSPECTED)
Bilirubin Urine: NEGATIVE
Glucose, UA: NEGATIVE mg/dL
Ketones, ur: NEGATIVE mg/dL
Nitrite: NEGATIVE
Protein, ur: 100 mg/dL — AB
RBC / HPF: >50 RBC/hpf (ref 0–5)
Specific Gravity, Urine: 1.013 (ref 1.005–1.030)
WBC, UA: >50 WBC/hpf (ref 0–5)
pH: 8 (ref 5.0–8.0)

## 2023-04-20 LAB — TROPONIN I (HIGH SENSITIVITY)
Troponin I (High Sensitivity): 7 ng/L (ref <18)
Troponin I (High Sensitivity): 6 ng/L (ref <18)

## 2023-04-20 LAB — CBC WITH DIFFERENTIAL/PLATELET
Abs Immature Granulocytes: 0.04 10*3/uL (ref 0.00–0.07)
Basophils Absolute: 0 10*3/uL (ref 0.0–0.1)
Basophils Relative: 0 %
Eosinophils Absolute: 0.1 10*3/uL (ref 0.0–0.5)
Eosinophils Relative: 1 %
HCT: 42.3 % (ref 39.0–52.0)
Hemoglobin: 14 g/dL (ref 13.0–17.0)
Immature Granulocytes: 0 %
Lymphocytes Relative: 9 %
Lymphs Abs: 0.9 10*3/uL (ref 0.7–4.0)
MCH: 34.2 pg — ABNORMAL HIGH (ref 26.0–34.0)
MCHC: 33.1 g/dL (ref 30.0–36.0)
MCV: 103.4 fL — ABNORMAL HIGH (ref 80.0–100.0)
Monocytes Absolute: 0.7 10*3/uL (ref 0.1–1.0)
Monocytes Relative: 6 %
Neutro Abs: 8.6 10*3/uL — ABNORMAL HIGH (ref 1.7–7.7)
Neutrophils Relative %: 84 %
Platelets: 169 10*3/uL (ref 150–400)
RBC: 4.09 MIL/uL — ABNORMAL LOW (ref 4.22–5.81)
RDW: 14.2 % (ref 11.5–15.5)
WBC: 10.3 10*3/uL (ref 4.0–10.5)
nRBC: 0 % (ref 0.0–0.2)

## 2023-04-20 LAB — COMPREHENSIVE METABOLIC PANEL
ALT: 13 U/L (ref 0–44)
AST: 16 U/L (ref 15–41)
Albumin: 3.2 g/dL — ABNORMAL LOW (ref 3.5–5.0)
Alkaline Phosphatase: 92 U/L (ref 38–126)
Anion gap: 9 (ref 5–15)
BUN: 51 mg/dL — ABNORMAL HIGH (ref 8–23)
CO2: 27 mmol/L (ref 22–32)
Calcium: 9.3 mg/dL (ref 8.9–10.3)
Chloride: 116 mmol/L — ABNORMAL HIGH (ref 98–111)
Creatinine, Ser: 1.11 mg/dL (ref 0.61–1.24)
GFR, Estimated: >60 mL/min (ref >60)
Glucose, Bld: 105 mg/dL — ABNORMAL HIGH (ref 70–99)
Potassium: 3.9 mmol/L (ref 3.5–5.1)
Sodium: 152 mmol/L — ABNORMAL HIGH (ref 135–145)
Total Bilirubin: 0.9 mg/dL (ref <1.2)
Total Protein: 7.6 g/dL (ref 6.5–8.1)

## 2023-04-20 LAB — RESP PANEL BY RT-PCR (RSV, FLU A&B, COVID)  RVPGX2
Influenza A by PCR: NEGATIVE
Influenza B by PCR: NEGATIVE
Resp Syncytial Virus by PCR: NEGATIVE
SARS Coronavirus 2 by RT PCR: NEGATIVE

## 2023-04-20 LAB — LACTIC ACID, PLASMA: Lactic Acid, Venous: 1 mmol/L (ref 0.5–1.9)

## 2023-04-20 MED ORDER — CEPHALEXIN 500 MG PO CAPS
500.0000 mg | ORAL_CAPSULE | Freq: Once | ORAL | Status: AC
Start: 2023-04-20 — End: 2023-04-20
  Administered 2023-04-20: 500 mg via ORAL
  Filled 2023-04-20: qty 1

## 2023-04-20 MED ORDER — CEPHALEXIN 500 MG PO CAPS: 500.0000 mg | ORAL_CAPSULE | Freq: Three times a day (TID) | ORAL | 0 refills | Status: DC

## 2023-04-20 NOTE — ED Notes (Signed)
Patient cleaned of urinary incontinence; new depends and chux applied.

## 2023-04-20 NOTE — ED Notes (Signed)
Called legal guardian [name] to give update.

## 2023-04-20 NOTE — ED Triage Notes (Signed)
PT brought in by [facility] from [facility] pt has hx of Cerebral Palsy and staff states has had increased weakness. He did see PCP yesterday and DX with URI and given meds. STaff states symptoms have not improved. Pt not answering questions in triage.

## 2023-04-20 NOTE — ED Notes (Signed)
PT brought in by [facility] from [name] on [address] With co lethargy, seen pcp yesterday dx with URI and put on OTC meds. Staff concerned with UTI.

## 2023-04-20 NOTE — ED Notes (Signed)
Patient cleaned of urinary and bowel incontinence; new depends and chux pad applied. Patient adjusted in bed and covered in warm blanket.

## 2023-04-20 NOTE — ED Notes (Signed)
EMS called for transport.

## 2023-04-20 NOTE — ED Provider Notes (Signed)
[facility] Provider Note    Event Date/Time   First MD Initiated Contact with Patient 04/20/23 1056     (approximate)   History   Chief Complaint: Weakness   HPI  ANKITH ISSA is a 82 y.o. male with a history of cerebral palsy, state cancer who is brought to the ED due to increased weakness.  He is wheelchair and Hoyer lift dependent at baseline.  Diagnosed with a upper respiratory fraction yesterday by PCP, no improvement with symptoms so far.  Patient denies pain or other acute complaints.          Physical Exam   Triage Vital Signs: ED Triage Vitals  Encounter Vitals Group     BP 04/20/23 0929 97/75     Systolic BP Percentile --      Diastolic BP Percentile --      Pulse Rate 04/20/23 0929 91     Resp 04/20/23 0929 18     Temp 04/20/23 0929 98.3 F (36.8 C)     Temp Source 04/20/23 0929 Axillary     SpO2 04/20/23 0929 95 %     Weight 04/20/23 0930 158 lb (71.7 kg)     Height --      Head Circumference --      Peak Flow --      Pain Score --      Pain Loc --      Pain Education --      Exclude from Growth Chart --     Most recent vital signs: Vitals:   04/20/23 1130 04/20/23 1200  BP: 106/72 96/67  Pulse: 84 95  Resp: 18 (!) 21  Temp:    SpO2: 96% 97%    General: Awake, no distress.  CV:  Good peripheral perfusion.  Regular rate rhythm Resp:  Normal effort.  Clear to auscultation bilaterally Abd:  No distention.  Soft nontender.  No suprapubic firmness Other:  Moist oral mucosa   ED Results / Procedures / Treatments   Labs (all labs ordered are listed, but only abnormal results are displayed) Labs Reviewed  COMPREHENSIVE METABOLIC PANEL - Abnormal; Notable for the following components:      Result Value   Sodium 152 (*)    Chloride 116 (*)    Glucose, Bld 105 (*)    BUN 51 (*)    Albumin 3.2 (*)    All other components within normal limits  CBC WITH DIFFERENTIAL/PLATELET - Abnormal; Notable for the  following components:   RBC 4.09 (*)    MCV 103.4 (*)    MCH 34.2 (*)    Neutro Abs 8.6 (*)    All other components within normal limits  URINALYSIS, W/ REFLEX TO CULTURE (INFECTION SUSPECTED) - Abnormal; Notable for the following components:   Color, Urine YELLOW (*)    APPearance CLOUDY (*)    Hgb urine dipstick LARGE (*)    Protein, ur 100 (*)    Leukocytes,Ua MODERATE (*)    Bacteria, UA FEW (*)    All other components within normal limits  RESP PANEL BY RT-PCR (RSV, FLU A&B, COVID)  RVPGX2  CULTURE, BLOOD (ROUTINE X 2)  CULTURE, BLOOD (ROUTINE X 2)  LACTIC ACID, PLASMA  TROPONIN I (HIGH SENSITIVITY)  TROPONIN I (HIGH SENSITIVITY)     EKG    RADIOLOGY    PROCEDURES:  Procedures   MEDICATIONS ORDERED IN ED: Medications  cephALEXin (KEFLEX) capsule 500 mg (500 mg Oral Given 04/20/23 1211)  IMPRESSION / MDM / ASSESSMENT AND PLAN / ED COURSE  I reviewed the triage vital signs and the nursing notes.  DDx: Dehydration, AKI, anemia, UTI, COVID, influenza  Patient's presentation is most consistent with acute presentation with potential threat to life or bodily function.  Patient presents with generalized weakness.  Bedbound at baseline.  Vital signs unremarkable, no acute symptoms, exam is nonfocal.  Labs are overall unremarkable except for urinalysis consistent with UTI.  He is not septic.  Doubt obstructing stone or pyelonephritis.  Will plan to start Keflex, stable for discharge back to his group home       FINAL CLINICAL IMPRESSION(S) / ED DIAGNOSES   Final diagnoses:  Cystitis     Rx / DC Orders   ED Discharge Orders          Ordered    cephALEXin (KEFLEX) 500 MG capsule  3 times daily        04/20/23 1219             Note:  This document was prepared using Dragon voice recognition software and may include unintentional dictation errors.   Sharman Cheek, MD 04/20/23 [phone]

## 2023-04-20 NOTE — Discharge Instructions (Signed)
Your urine test reveals a bladder infection. Blood tests were okay.

## 2023-04-21 LAB — BLOOD CULTURE ID PANEL (REFLEXED) - BCID2

## 2023-04-22 ENCOUNTER — Emergency Department: Payer: 59

## 2023-04-22 ENCOUNTER — Inpatient Hospital Stay
Admission: EM | Admit: 2023-04-22 | Discharge: 2023-05-01 | DRG: 871 | Disposition: A | Discharge status: Home / Self Care | Payer: 59 | Attending: Internal Medicine | Admitting: Internal Medicine

## 2023-04-22 ENCOUNTER — Other Ambulatory Visit: Payer: Self-pay

## 2023-04-22 DIAGNOSIS — B964 Proteus (mirabilis) (morganii) as the cause of diseases classified elsewhere: Secondary | ICD-10-CM | POA: Insufficient documentation

## 2023-04-22 DIAGNOSIS — J9601 Acute respiratory failure with hypoxia: Secondary | ICD-10-CM

## 2023-04-22 DIAGNOSIS — F419 Anxiety disorder, unspecified: Secondary | ICD-10-CM | POA: Diagnosis present

## 2023-04-22 DIAGNOSIS — F79 Unspecified intellectual disabilities: Secondary | ICD-10-CM

## 2023-04-22 DIAGNOSIS — Z1152 Encounter for screening for COVID-19: Secondary | ICD-10-CM | POA: Diagnosis not present

## 2023-04-22 DIAGNOSIS — B370 Candidal stomatitis: Secondary | ICD-10-CM | POA: Insufficient documentation

## 2023-04-22 DIAGNOSIS — Z515 Encounter for palliative care: Secondary | ICD-10-CM | POA: Diagnosis not present

## 2023-04-22 DIAGNOSIS — R1310 Dysphagia, unspecified: Secondary | ICD-10-CM | POA: Diagnosis not present

## 2023-04-22 DIAGNOSIS — N3000 Acute cystitis without hematuria: Secondary | ICD-10-CM | POA: Diagnosis present

## 2023-04-22 DIAGNOSIS — R404 Transient alteration of awareness: Secondary | ICD-10-CM | POA: Diagnosis not present

## 2023-04-22 DIAGNOSIS — A4159 Other Gram-negative sepsis: Secondary | ICD-10-CM | POA: Diagnosis not present

## 2023-04-22 DIAGNOSIS — N400 Enlarged prostate without lower urinary tract symptoms: Secondary | ICD-10-CM | POA: Diagnosis present

## 2023-04-22 DIAGNOSIS — R58 Hemorrhage, not elsewhere classified: Secondary | ICD-10-CM | POA: Diagnosis not present

## 2023-04-22 DIAGNOSIS — R197 Diarrhea, unspecified: Secondary | ICD-10-CM | POA: Insufficient documentation

## 2023-04-22 DIAGNOSIS — T17908A Unspecified foreign body in respiratory tract, part unspecified causing other injury, initial encounter: Secondary | ICD-10-CM

## 2023-04-22 DIAGNOSIS — E785 Hyperlipidemia, unspecified: Secondary | ICD-10-CM | POA: Diagnosis present

## 2023-04-22 DIAGNOSIS — Z993 Dependence on wheelchair: Secondary | ICD-10-CM

## 2023-04-22 DIAGNOSIS — D539 Nutritional anemia, unspecified: Secondary | ICD-10-CM | POA: Diagnosis not present

## 2023-04-22 DIAGNOSIS — D696 Thrombocytopenia, unspecified: Secondary | ICD-10-CM

## 2023-04-22 DIAGNOSIS — E870 Hyperosmolality and hypernatremia: Secondary | ICD-10-CM | POA: Diagnosis not present

## 2023-04-22 DIAGNOSIS — N390 Urinary tract infection, site not specified: Secondary | ICD-10-CM

## 2023-04-22 DIAGNOSIS — R231 Pallor: Secondary | ICD-10-CM | POA: Diagnosis not present

## 2023-04-22 DIAGNOSIS — I872 Venous insufficiency (chronic) (peripheral): Secondary | ICD-10-CM | POA: Diagnosis not present

## 2023-04-22 DIAGNOSIS — R7881 Bacteremia: Secondary | ICD-10-CM | POA: Diagnosis not present

## 2023-04-22 DIAGNOSIS — L89152 Pressure ulcer of sacral region, stage 2: Secondary | ICD-10-CM | POA: Diagnosis not present

## 2023-04-22 DIAGNOSIS — J189 Pneumonia, unspecified organism: Secondary | ICD-10-CM | POA: Diagnosis not present

## 2023-04-22 DIAGNOSIS — G809 Cerebral palsy, unspecified: Secondary | ICD-10-CM | POA: Diagnosis present

## 2023-04-22 DIAGNOSIS — I499 Cardiac arrhythmia, unspecified: Secondary | ICD-10-CM | POA: Diagnosis not present

## 2023-04-22 DIAGNOSIS — Z8249 Family history of ischemic heart disease and other diseases of the circulatory system: Secondary | ICD-10-CM

## 2023-04-22 DIAGNOSIS — R6520 Severe sepsis without septic shock: Secondary | ICD-10-CM | POA: Diagnosis not present

## 2023-04-22 DIAGNOSIS — R000 Tachycardia, unspecified: Secondary | ICD-10-CM | POA: Diagnosis not present

## 2023-04-22 DIAGNOSIS — B379 Candidiasis, unspecified: Secondary | ICD-10-CM | POA: Diagnosis not present

## 2023-04-22 DIAGNOSIS — Z79899 Other long term (current) drug therapy: Secondary | ICD-10-CM

## 2023-04-22 DIAGNOSIS — Z8546 Personal history of malignant neoplasm of prostate: Secondary | ICD-10-CM | POA: Diagnosis not present

## 2023-04-22 DIAGNOSIS — A419 Sepsis, unspecified organism: Secondary | ICD-10-CM | POA: Diagnosis not present

## 2023-04-22 DIAGNOSIS — E876 Hypokalemia: Secondary | ICD-10-CM

## 2023-04-22 DIAGNOSIS — R1319 Other dysphagia: Secondary | ICD-10-CM | POA: Diagnosis not present

## 2023-04-22 DIAGNOSIS — R918 Other nonspecific abnormal finding of lung field: Secondary | ICD-10-CM | POA: Diagnosis not present

## 2023-04-22 DIAGNOSIS — Z743 Need for continuous supervision: Secondary | ICD-10-CM | POA: Diagnosis not present

## 2023-04-22 DIAGNOSIS — N3001 Acute cystitis with hematuria: Secondary | ICD-10-CM | POA: Diagnosis not present

## 2023-04-22 DIAGNOSIS — J690 Pneumonitis due to inhalation of food and vomit: Secondary | ICD-10-CM | POA: Diagnosis not present

## 2023-04-22 DIAGNOSIS — R4182 Altered mental status, unspecified: Principal | ICD-10-CM

## 2023-04-22 DIAGNOSIS — R509 Fever, unspecified: Secondary | ICD-10-CM | POA: Diagnosis not present

## 2023-04-22 LAB — CBC WITH DIFFERENTIAL/PLATELET
Abs Immature Granulocytes: 0.08 10*3/uL — ABNORMAL HIGH (ref 0.00–0.07)
Basophils Absolute: 0 10*3/uL (ref 0.0–0.1)
Basophils Relative: 0 %
Eosinophils Absolute: 0 10*3/uL (ref 0.0–0.5)
Eosinophils Relative: 0 %
HCT: 41.4 % (ref 39.0–52.0)
Hemoglobin: 13.5 g/dL (ref 13.0–17.0)
Immature Granulocytes: 1 %
Lymphocytes Relative: 9 %
Lymphs Abs: 0.9 10*3/uL (ref 0.7–4.0)
MCH: 34.3 pg — ABNORMAL HIGH (ref 26.0–34.0)
MCHC: 32.6 g/dL (ref 30.0–36.0)
MCV: 105.1 fL — ABNORMAL HIGH (ref 80.0–100.0)
Monocytes Absolute: 0.5 10*3/uL (ref 0.1–1.0)
Monocytes Relative: 5 %
Neutro Abs: 8.2 10*3/uL — ABNORMAL HIGH (ref 1.7–7.7)
Neutrophils Relative %: 85 %
Platelets: 173 10*3/uL (ref 150–400)
RBC: 3.94 MIL/uL — ABNORMAL LOW (ref 4.22–5.81)
RDW: 14.3 % (ref 11.5–15.5)
WBC: 9.7 10*3/uL (ref 4.0–10.5)
nRBC: 0 % (ref 0.0–0.2)

## 2023-04-22 LAB — URINALYSIS, W/ REFLEX TO CULTURE (INFECTION SUSPECTED)
Bilirubin Urine: NEGATIVE
Glucose, UA: NEGATIVE mg/dL
Ketones, ur: NEGATIVE mg/dL
Nitrite: NEGATIVE
Protein, ur: 100 mg/dL — AB
RBC / HPF: >50 RBC/hpf (ref 0–5)
Specific Gravity, Urine: 1.02 (ref 1.005–1.030)
WBC, UA: >50 WBC/hpf (ref 0–5)
pH: 5 (ref 5.0–8.0)

## 2023-04-22 LAB — SODIUM: Sodium: 155 mmol/L — ABNORMAL HIGH (ref 135–145)

## 2023-04-22 LAB — STREP PNEUMONIAE URINARY ANTIGEN: Strep Pneumo Urinary Antigen: NEGATIVE

## 2023-04-22 LAB — COMPREHENSIVE METABOLIC PANEL
ALT: 13 U/L (ref 0–44)
AST: 16 U/L (ref 15–41)
Albumin: 2.9 g/dL — ABNORMAL LOW (ref 3.5–5.0)
Alkaline Phosphatase: 80 U/L (ref 38–126)
Anion gap: 13 (ref 5–15)
BUN: 58 mg/dL — ABNORMAL HIGH (ref 8–23)
CO2: 24 mmol/L (ref 22–32)
Calcium: 8.9 mg/dL (ref 8.9–10.3)
Chloride: 119 mmol/L — ABNORMAL HIGH (ref 98–111)
Creatinine, Ser: 1.21 mg/dL (ref 0.61–1.24)
GFR, Estimated: >60 mL/min (ref >60)
Glucose, Bld: 146 mg/dL — ABNORMAL HIGH (ref 70–99)
Potassium: 3.7 mmol/L (ref 3.5–5.1)
Sodium: 156 mmol/L — ABNORMAL HIGH (ref 135–145)
Total Bilirubin: 0.9 mg/dL (ref <1.2)
Total Protein: 7.3 g/dL (ref 6.5–8.1)

## 2023-04-22 LAB — RESP PANEL BY RT-PCR (RSV, FLU A&B, COVID)  RVPGX2
Influenza A by PCR: NEGATIVE
Influenza B by PCR: NEGATIVE
Resp Syncytial Virus by PCR: NEGATIVE
SARS Coronavirus 2 by RT PCR: NEGATIVE

## 2023-04-22 LAB — BLOOD GAS, VENOUS
Acid-Base Excess: 1.3 mmol/L (ref 0.0–2.0)
Bicarbonate: 25 mmol/L (ref 20.0–28.0)
O2 Saturation: 71.5 %
Patient temperature: 37
pCO2, Ven: 36 mm[Hg] — ABNORMAL LOW (ref 44–60)
pH, Ven: 7.45 — ABNORMAL HIGH (ref 7.25–7.43)
pO2, Ven: 39 mm[Hg] (ref 32–45)

## 2023-04-22 LAB — BLOOD GAS, ARTERIAL
Acid-Base Excess: 1.8 mmol/L (ref 0.0–2.0)
Bicarbonate: 23.9 mmol/L (ref 20.0–28.0)
O2 Content: 10 L/min
O2 Saturation: 95.2 %
Patient temperature: 37
pCO2 arterial: 30 mm[Hg] — ABNORMAL LOW (ref 32–48)
pH, Arterial: 7.51 — ABNORMAL HIGH (ref 7.35–7.45)
pO2, Arterial: 60 mm[Hg] — ABNORMAL LOW (ref 83–108)

## 2023-04-22 LAB — PROTIME-INR
INR: 1.2 (ref 0.8–1.2)
Prothrombin Time: 15.5 s — ABNORMAL HIGH (ref 11.4–15.2)

## 2023-04-22 LAB — PROCALCITONIN: Procalcitonin: 0.21 ng/mL

## 2023-04-22 LAB — LACTIC ACID, PLASMA: Lactic Acid, Venous: 1.4 mmol/L (ref 0.5–1.9)

## 2023-04-22 LAB — SODIUM, URINE, RANDOM: Sodium, Ur: 63 mmol/L

## 2023-04-22 LAB — MRSA NEXT GEN BY PCR, NASAL: MRSA by PCR Next Gen: NOT DETECTED

## 2023-04-22 LAB — APTT: aPTT: 33 s (ref 24–36)

## 2023-04-22 LAB — TROPONIN I (HIGH SENSITIVITY): Troponin I (High Sensitivity): 9 ng/L (ref <18)

## 2023-04-22 LAB — OSMOLALITY, URINE: Osmolality, Ur: 606 mosm/kg (ref 300–900)

## 2023-04-22 MED ORDER — ENOXAPARIN SODIUM 40 MG/0.4ML IJ SOSY
40.0000 mg | PREFILLED_SYRINGE | INTRAMUSCULAR | Status: DC
  Administered 2023-04-22 – 2023-04-28 (×7): 40 mg via SUBCUTANEOUS
  Filled 2023-04-22 (×7): qty 0.4

## 2023-04-22 MED ORDER — PIPERACILLIN-TAZOBACTAM 3.375 G IVPB 30 MIN
3.3750 g | Freq: Once | INTRAVENOUS | Status: AC
  Administered 2023-04-22: 3.375 g via INTRAVENOUS
  Filled 2023-04-22 (×2): qty 50

## 2023-04-22 MED ORDER — AZITHROMYCIN 500 MG IV SOLR
500.0000 mg | Freq: Once | INTRAVENOUS | Status: AC
  Administered 2023-04-22: 500 mg via INTRAVENOUS
  Filled 2023-04-22: qty 5

## 2023-04-22 MED ORDER — ONDANSETRON HCL 4 MG/2ML IJ SOLN: 4.0000 mg | Freq: Four times a day (QID) | INTRAMUSCULAR | Status: DC | PRN

## 2023-04-22 MED ORDER — LACTATED RINGERS IV SOLN: 150.0000 mL/h | INTRAVENOUS | Status: DC

## 2023-04-22 MED ORDER — ONDANSETRON HCL 4 MG PO TABS: 4.0000 mg | ORAL_TABLET | Freq: Four times a day (QID) | ORAL | Status: DC | PRN

## 2023-04-22 MED ORDER — SODIUM CHLORIDE 0.45 % IV SOLN: INTRAVENOUS | Status: DC

## 2023-04-22 MED ORDER — SODIUM CHLORIDE 0.9 % IV SOLN
500.0000 mg | INTRAVENOUS | Status: DC
  Administered 2023-04-23 – 2023-04-24 (×2): 500 mg via INTRAVENOUS
  Filled 2023-04-22 (×2): qty 5

## 2023-04-22 MED ORDER — LACTATED RINGERS IV BOLUS: 1000.0000 mL | Freq: Once | INTRAVENOUS | Status: DC

## 2023-04-22 MED ORDER — LACTATED RINGERS IV BOLUS (SEPSIS)
1000.0000 mL | Freq: Once | INTRAVENOUS | Status: AC
  Administered 2023-04-22: 1000 mL via INTRAVENOUS

## 2023-04-22 MED ORDER — VANCOMYCIN HCL 1500 MG/300ML IV SOLN
1500.0000 mg | Freq: Once | INTRAVENOUS | Status: DC
  Filled 2023-04-22: qty 300

## 2023-04-22 MED ORDER — ACETAMINOPHEN 650 MG RE SUPP
650.0000 mg | Freq: Four times a day (QID) | RECTAL | Status: DC | PRN
  Administered 2023-04-22 (×2): 650 mg via RECTAL
  Filled 2023-04-22 (×2): qty 2

## 2023-04-22 MED ORDER — VANCOMYCIN HCL IN DEXTROSE 1-5 GM/200ML-% IV SOLN: 1000.0000 mg | INTRAVENOUS | Status: DC

## 2023-04-22 MED ORDER — PIPERACILLIN-TAZOBACTAM 3.375 G IVPB
3.3750 g | Freq: Three times a day (TID) | INTRAVENOUS | Status: DC
  Administered 2023-04-22 – 2023-04-23 (×3): 3.375 g via INTRAVENOUS
  Filled 2023-04-22 (×3): qty 50

## 2023-04-22 MED ORDER — ACETAMINOPHEN 325 MG PO TABS: 650.0000 mg | ORAL_TABLET | Freq: Four times a day (QID) | ORAL | Status: DC | PRN

## 2023-04-22 MED ORDER — METHYLPREDNISOLONE SODIUM SUCC 40 MG IJ SOLR
40.0000 mg | Freq: Once | INTRAMUSCULAR | Status: AC
  Administered 2023-04-22: 40 mg via INTRAVENOUS
  Filled 2023-04-22: qty 1

## 2023-04-22 MED ORDER — LACTATED RINGERS IV BOLUS (SEPSIS)
250.0000 mL | Freq: Once | INTRAVENOUS | Status: AC
  Administered 2023-04-22: 250 mL via INTRAVENOUS

## 2023-04-22 NOTE — Assessment & Plan Note (Signed)
December 27 blood cultures growing out Proteus Will place on IV Zosyn for infectious coverage pending speciation and sensitivities Monitor

## 2023-04-22 NOTE — Assessment & Plan Note (Signed)
Decompensated resp failure now requring 10L NS  Noted infiltrate on imaging concerning for aspiration pneumonia Also with Proteus bacteremia Will place on Zosyn as well as azithromycin for respiratory coverage Blood and respiratory cultures Blood gas pending Had lengthy discussion with family at the bedside about overall respiratory status-still wish for patient be full code

## 2023-04-22 NOTE — H&P (Addendum)
History and Physical    Patient: [name] FUX:[id number] DOB: Apr 27, 1940 DOA: 04/22/2023 DOS: the patient was seen and examined on 04/22/2023 PCP: Alba Cory, MD  Patient coming from:  group home   Chief Complaint:  Chief Complaint  Patient presents with   Altered Mental Status   HPI: [name] is a 82 y.o. male with medical history significant of intellectual disability, hyperlipidemia, mental retardation, BPH, wheelchair dependent presenting with sepsis, acute respiratory failure with hypoxia, aspiration pneumonia, UTI, hypernatremia, Proteus bacteremia.  Limited history in the setting of electrical disability.  Patient noted to have been evaluated in the ER in December 27.  Was formally diagnosed with issues including UTI.  Was discharged back to group home on Keflex.  Unclear if patient has been receiving medication.  Patient with noted worsening lethargy at group home.  Noted temperature of 105 by EMS.  Room sats were 85% on EMS evaluation requiring a nonrebreather. Presented to the ER Tmax of 102, heart rate 100s, respirations mid to upper 20s, BP stable.  Satting 93% on 10 L high flow nasal cannula.  White count 9.7, hemoglobin 13.5, platelets 173, VBG grossly stable, COVID flu and RSV negative, urinalysis indicative of infection, lactate 1.  Creatinine 1.2.  Troponin within normal limits.  Procalcitonin 0.21.  Chest x-ray with right hilar opacity concerning for atelectasis versus pneumonia. Review of Systems: As mentioned in the history of present illness. All other systems reviewed and are negative. Past Medical History:  Diagnosis Date   Anxiety    Atrophy, disuse, muscle    BPH (benign prostatic hyperplasia)    Cerebral palsy (HCC)    Eczema    Elevated lipids    Elevated PSA    Exotropia    History of kidney stones    Hyperlipidemia    Kidney stone    Leukopenia    Lower extremity edema    lymphedema   Mental retardation    Over weight    Prostate  cancer (HCC)    Venous insufficiency    Vitamin D deficiency    Wheelchair dependence    Past Surgical History:  Procedure Laterality Date   COLONOSCOPY WITH PROPOFOL N/A 05/10/2020   Procedure: COLONOSCOPY WITH PROPOFOL;  Surgeon: Toney Reil, MD;  Location: [facility] ENDOSCOPY;  Service: Gastroenterology;  Laterality: N/A;   COLONOSCOPY WITH PROPOFOL N/A 05/12/2020   Procedure: COLONOSCOPY WITH PROPOFOL;  Surgeon: Toney Reil, MD;  Location: [facility] ENDOSCOPY;  Service: Gastroenterology;  Laterality: N/A;   CYSTOSCOPY W/ RETROGRADES Left 10/04/2021   Procedure: CYSTOSCOPY WITH RETROGRADE PYELOGRAM;  Surgeon: Riki Altes, MD;  Location: [facility] ORS;  Service: Urology;  Laterality: Left;   CYSTOSCOPY/URETEROSCOPY/HOLMIUM LASER/STENT PLACEMENT Right 08/14/2017   Procedure: CYSTOSCOPY/URETEROSCOPY/HOLMIUM LASER/STENT PLACEMENT;  Surgeon: Riki Altes, MD;  Location: [facility] ORS;  Service: Urology;  Laterality: Right;   CYSTOSCOPY/URETEROSCOPY/HOLMIUM LASER/STENT PLACEMENT Left 10/04/2021   Procedure: CYSTOSCOPY/URETEROSCOPY/HOLMIUM LASER/STENT PLACEMENT/ LITHOPAXY;  Surgeon: Riki Altes, MD;  Location: [facility] ORS;  Service: Urology;  Laterality: Left;   ESOPHAGOGASTRODUODENOSCOPY (EGD) WITH PROPOFOL N/A 05/12/2020   Procedure: ESOPHAGOGASTRODUODENOSCOPY (EGD) WITH PROPOFOL;  Surgeon: Toney Reil, MD;  Location: [facility] ENDOSCOPY;  Service: Gastroenterology;  Laterality: N/A;   INCISION AND DRAINAGE ABSCESS Left 07/05/2021   Procedure: INCISION AND DRAINAGE SCROTAL WALL ABSCESS;  Surgeon: Riki Altes, MD;  Location: [facility] ORS;  Service: Urology;  Laterality: Left;   leg circulation surgery Right    PROSTATE SURGERY  11/10/2008   BRACHYTHERAPY  SCROTAL EXPLORATION Left 07/05/2021   Procedure: SCROTUM EXPLORATION;  Surgeon: Riki Altes, MD;  Location: [facility] ORS;  Service: Urology;  Laterality: Left;   Social History:  reports that he has never smoked. He has never used  smokeless tobacco. He reports that he does not drink alcohol and does not use drugs.  No Known Allergies  Family History  Problem Relation Age of Onset   Heart attack Brother    Heart disease Other     Prior to Admission medications   Medication Sig Start Date End Date Taking? Authorizing Provider  acetaminophen (TYLENOL) 325 MG tablet TAKE 2 TABLETS (650 MG TOTAL) BY MOUTH EVERY 6 HOURS AS NEEDED FOR MILD PAIN OR HEADACHE. 05/08/22  Yes Sowles, Danna Hefty, MD  alfuzosin (UROXATRAL) 10 MG 24 hr tablet Take 1 tablet (10 mg total) by mouth daily. 12/13/22   Alba Cory, MD  ALPRAZolam Prudy Feeler) 0.25 MG tablet Take 1 tablet (0.25 mg total) by mouth daily as needed (agitation (crisis med)). 12/13/22   Alba Cory, MD  atorvastatin (LIPITOR) 40 MG tablet Take 1 tablet (40 mg total) by mouth every evening. 12/13/22   Alba Cory, MD  augmented betamethasone dipropionate (DIPROLENE-AF) 0.05 % cream APPLY TOPICALLY AS DIRECTED TO RASH AS NEEDED 11/16/21   Alba Cory, MD  benzonatate (TESSALON) 100 MG capsule Take 2 capsules (200 mg total) by mouth 2 (two) times daily as needed for cough. 04/19/23   Berniece Salines, FNP  Calcium Carb-Cholecalciferol (CALCIUM + VITAMIN D3) 600-10 MG-MCG TABS TAKE 1 TABLET BY MOUTH TWICE DAILY. 04/11/23   Alba Cory, MD  cephALEXin (KEFLEX) 500 MG capsule Take 1 capsule (500 mg total) by mouth 3 (three) times daily. 04/20/23   Sharman Cheek, MD  cetirizine (ZYRTEC) 10 MG tablet Take 1 tablet (10 mg total) by mouth daily. 04/19/23   Berniece Salines, FNP  Cranberry Juice Powder 425 MG CAPS TAKE 1 CAPSULE BY MOUTH ONCE DAILY 12/18/22   Alba Cory, MD  fluticasone [facility]) 50 MCG/ACT nasal spray Place 2 sprays into both nostrils daily. 04/19/23   Berniece Salines, FNP  GNP VITAMIN C 500 MG tablet TAKE 1 TABLET BY MOUTH ONCE DAILY 12/18/22   Alba Cory, MD  guaiFENesin (MUCINEX) 600 MG 12 hr tablet Take 1 tablet (600 mg total) by mouth 2 (two) times  daily as needed for cough. 04/19/23   Berniece Salines, FNP  Incontinence Supply Disposable (DEPEND SILHOUETTE BRIEFS L/XL) MISC 1 each by Does not apply route 2 (two) times daily as needed. 06/02/19   Alba Cory, MD  Iron-FA-B Cmp-C-Biot-Probiotic (FUSION PLUS) CAPS TAKE 1 CAPSULE BY MOUTH DAILY 04/11/23   Alba Cory, MD  lubiprostone (AMITIZA) 24 MCG capsule Take 1 capsule (24 mcg total) by mouth 2 (two) times daily with a meal. 12/13/22   Carlynn Purl, Danna Hefty, MD  Nutritional Supplements (ENSURE ORIGINAL) LIQD DRINK 1 BOTTLE BY MOUTH 3 TIMES DAILY 11/24/22   Alba Cory, MD  nystatin cream (MYCOSTATIN) APPLY 1 APPLICATION TOPICALLY 2 TIMES DAILY 07/20/21   McGowan, Carollee Herter A, PA-C  Polyethyl Glycol-Propyl Glycol (SYSTANE ULTRA OP) Place 1 drop into both eyes 2 (two) times daily as needed (dry eyes).    [provider]  sertraline (ZOLOFT) 100 MG tablet Take 1 tablet (100 mg total) by mouth daily. 12/13/22   Alba Cory, MD    Physical Exam: Vitals:   04/22/23 1157 04/22/23 1244 04/22/23 1253 04/22/23 1300  BP:   (!) 92/46 112/78  Pulse:   Marland Kitchen)  109 (!) 106  Resp:   (!) 25 (!) 23  Temp:  98.9 F (37.2 C)    TempSrc:  Rectal    SpO2:   94% 93%  Height: 5\' 8"  (1.727 m)      Physical Exam Constitutional:      Comments: Positive moderate increased work of breathing at the bedside  HENT:     Head: Normocephalic and atraumatic.     Nose: Nose normal.     Mouth/Throat:     Mouth: Mucous membranes are moist.  Eyes:     Pupils: Pupils are equal, round, and reactive to light.  Cardiovascular:     Rate and Rhythm: Regular rhythm. Tachycardia present.  Pulmonary:     Comments: Positive mild increased work of breathing Positive mild to moderate accessory muscle use on exam Abdominal:     General: Bowel sounds are normal.  Skin:    General: Skin is warm.  Neurological:     General: No focal deficit present.  Psychiatric:        Mood and Affect: Mood normal.     Data  Reviewed:  There are no new results to review at this time.  DG Chest Port 1 View CLINICAL DATA:  Altered mental status.  Fever.  sepsis.  EXAM: PORTABLE CHEST 1 VIEW  COMPARISON:  04/20/2023  FINDINGS: The heart size and mediastinal contours are within normal limits. New opacity is seen in the right perihilar region, which may be due to atelectasis or pneumonia. Left lung is clear. No pleural effusion seen.  IMPRESSION: New right perihilar opacity, which may be due to atelectasis or pneumonia.  Electronically Signed   By: Danae Orleans M.D.   On: 04/22/2023 10:38  Lab Results  Component Value Date   WBC 9.7 04/22/2023   HGB 13.5 04/22/2023   HCT 41.4 04/22/2023   MCV 105.1 (H) 04/22/2023   PLT 173 04/22/2023   Last metabolic panel Lab Results  Component Value Date   GLUCOSE 146 (H) 04/22/2023   NA 156 (H) 04/22/2023   K 3.7 04/22/2023   CL 119 (H) 04/22/2023   CO2 24 04/22/2023   BUN 58 (H) 04/22/2023   CREATININE 1.21 04/22/2023   GFRNONAA >60 04/22/2023   CALCIUM 8.9 04/22/2023   PHOS 2.5 05/16/2020   PROT 7.3 04/22/2023   ALBUMIN 2.9 (L) 04/22/2023   LABGLOB 2.8 06/21/2018   AGRATIO 1.7 02/11/2015   BILITOT 0.9 04/22/2023   ALKPHOS 80 04/22/2023   AST 16 04/22/2023   ALT 13 04/22/2023   ANIONGAP 13 04/22/2023    Assessment and Plan: * Sepsis (HCC) Meeting sepsis criteria with Tmax of 102, heart rate 100s, respirations into the 30s White count 9.7 Lactate 1.4 Procalcitonin pending Suspect likely multifactorial with contributions of aspiration pneumonia, Proteus bacteremia as well as UTI Started on IV Zosyn in the ER Will add on azithromycin and vancomycin for expanded coverage pending MRSA in discussion w/ Dr. Larinda Buttery  LR IV fluids Continues home oxygen  Acute respiratory failure with hypoxia (HCC) Decompensated resp failure now requring 10L NS  Noted infiltrate on imaging concerning for aspiration pneumonia Also with Proteus bacteremia Will  place on Zosyn as well as azithromycin for respiratory coverage Blood and respiratory cultures Blood gas pending Had lengthy discussion with family at the bedside about overall respiratory status-still wish for patient be full code  Hypernatremia Sodium 156 on presentation Clinically dry in the setting of sepsis Will place on half-normal saline IV fluid hydration Trend  sodium Check urine and serum studies Monitor  UTI (urinary tract infection) Recent diagnosis December 27 of UTI-placed on course of Keflex outpatient Urinalysis indicative of infection Noted concomitant Proteus bacteremia IV Zosyn Repeat urine culture Monitor   Bacteremia due to Proteus species December 27 blood cultures growing out Proteus Will place on IV Zosyn for infectious coverage pending speciation and sensitivities Monitor  Intellectual disability Baseline intellectual disability Group home resident Minimally verbal at baseline  Dyslipidemia Lipitor    Greater than 50% was spent in counseling and coordination of care with patient Critical care time:  80 minutes or more   Advance Care Planning:   Code Status: Full Code   Consults: Critical care   Family Communication: Family at the bedside.   Severity of Illness: The appropriate patient status for this patient is INPATIENT. Inpatient status is judged to be reasonable and necessary in order to provide the required intensity of service to ensure the patient's safety. [name] presenting symptoms, physical exam findings, and initial radiographic and laboratory data in the context of their chronic comorbidities is felt to place them at high risk for further clinical deterioration. Furthermore, it is not anticipated that the patient will be medically stable for discharge from the hospital within 2 midnights of admission.   * I certify that at the point of admission it is my clinical judgment that the patient will require inpatient hospital care  spanning beyond 2 midnights from the point of admission due to high intensity of service, high risk for further deterioration and high frequency of surveillance required.*  Author: Floydene Flock, MD 04/22/2023 2:06 PM  For on call review [email or web address].

## 2023-04-22 NOTE — Assessment & Plan Note (Signed)
Baseline intellectual disability Group home resident Minimally verbal at baseline

## 2023-04-22 NOTE — ED Notes (Signed)
This Consulting civil engineer called pts care home and spoke with Ethelene Browns. This RN requested that pts MAR be sent. Ethelene Browns stated that pt was here on Friday and that it should be in computer. Ethelene Browns informed that this needs to be sent with pt every time they come to the ED.

## 2023-04-22 NOTE — ED Notes (Signed)
Dr. Newton at bedside. 

## 2023-04-22 NOTE — ED Provider Notes (Signed)
[facility] Provider Note    Event Date/Time   First MD Initiated Contact with Patient 04/22/23 [phone]     (approximate)   History   Chief Complaint Altered Mental Status   HPI  [name] is a 82 y.o. male with past medical history of hyperlipidemia, cerebral palsy, and intellectual disability who presents to the ED for altered mental status.  Per EMS, patient has had a decline in his mental status over the past couple of days following recent diagnosis of UTI.  Staff at group home told EMS that patient has been taking his antibiotics as prescribed, however when EMS arrived, they found patient minimally responsive.  EMS reports that staff was continuing to attempt to feed the patient despite his declining mental status.  He was found to have room air oxygen saturations of 85%, improved on nonrebreather.  He was also found to be febrile to 102 orally.  He was given IV Tylenol en route.     Physical Exam   Triage Vital Signs: ED Triage Vitals  Encounter Vitals Group     BP      Systolic BP Percentile      Diastolic BP Percentile      Pulse      Resp      Temp      Temp src      SpO2      Weight      Height      Head Circumference      Peak Flow      Pain Score      Pain Loc      Pain Education      Exclude from Growth Chart     Most recent vital signs: Vitals:   04/22/23 1045 04/22/23 1120  BP: 93/64 101/61  Pulse: (!) 101 93  Resp: (!) 28 (!) 37  Temp:    SpO2: 96% 97%    Constitutional: Somnolent, opens eyes to voice. Eyes: Conjunctivae are normal.  Pupils equal, round, and reactive to light bilaterally. Head: Atraumatic. Nose: No congestion/rhinnorhea. Mouth/Throat: Mucous membranes are dry. Cardiovascular: Tachycardic, regular rhythm. Grossly normal heart sounds.  2+ radial pulses bilaterally. Respiratory: Tachypneic with increased respiratory effort and crackles bilaterally. Gastrointestinal: Soft and nontender. No  distention. Musculoskeletal: No lower extremity tenderness nor edema.  Neurologic: No verbal response, localizes to pain in all 4 extremities.    ED Results / Procedures / Treatments   Labs (all labs ordered are listed, but only abnormal results are displayed) Labs Reviewed  COMPREHENSIVE METABOLIC PANEL - Abnormal; Notable for the following components:      Result Value   Sodium 156 (*)    Chloride 119 (*)    Glucose, Bld 146 (*)    BUN 58 (*)    Albumin 2.9 (*)    All other components within normal limits  CBC WITH DIFFERENTIAL/PLATELET - Abnormal; Notable for the following components:   RBC 3.94 (*)    MCV 105.1 (*)    MCH 34.3 (*)    Neutro Abs 8.2 (*)    Abs Immature Granulocytes 0.08 (*)    All other components within normal limits  PROTIME-INR - Abnormal; Notable for the following components:   Prothrombin Time 15.5 (*)    All other components within normal limits  URINALYSIS, W/ REFLEX TO CULTURE (INFECTION SUSPECTED) - Abnormal; Notable for the following components:   Color, Urine YELLOW (*)    APPearance TURBID (*)  Hgb urine dipstick LARGE (*)    Protein, ur 100 (*)    Leukocytes,Ua MODERATE (*)    Bacteria, UA RARE (*)    All other components within normal limits  RESP PANEL BY RT-PCR (RSV, FLU A&B, COVID)  RVPGX2  CULTURE, BLOOD (ROUTINE X 2)  CULTURE, BLOOD (ROUTINE X 2)  LACTIC ACID, PLASMA  APTT  PROCALCITONIN  TROPONIN I (HIGH SENSITIVITY)     EKG  ED ECG REPORT I, Chesley Noon, the attending physician, personally viewed and interpreted this ECG.   Date: 04/22/2023  EKG Time: 10:13  Rate: 111  Rhythm: sinus tachycardia  Axis: Normal  Intervals:none  ST&T Change: Diffuse ST depressions  RADIOLOGY Chest x-ray reviewed and interpreted by me with right lower lobe infiltrate, no edema or effusion noted.  PROCEDURES:  Critical Care performed: Yes, see critical care procedure note(s)  .Critical Care  Performed by: Chesley Noon,  MD Authorized by: Chesley Noon, MD   Critical care provider statement:    Critical care time (minutes):  30   Critical care time was exclusive of:  Separately billable procedures and treating other patients and teaching time   Critical care was necessary to treat or prevent imminent or life-threatening deterioration of the following conditions:  Sepsis, shock and respiratory failure   Critical care was time spent personally by me on the following activities:  Development of treatment plan with patient or surrogate, discussions with consultants, evaluation of patient's response to treatment, examination of patient, ordering and review of laboratory studies, ordering and review of radiographic studies, ordering and performing treatments and interventions, pulse oximetry, re-evaluation of patient's condition and review of old charts   I assumed direction of critical care for this patient from another provider in my specialty: no     Care discussed with: admitting provider      MEDICATIONS ORDERED IN ED: Medications  azithromycin (ZITHROMAX) 500 mg in sodium chloride 0.9 % 250 mL IVPB (500 mg Intravenous New Bag/Given 04/22/23 1049)  piperacillin-tazobactam (ZOSYN) IVPB 3.375 g (0 g Intravenous Stopped 04/22/23 1043)  lactated ringers bolus 1,000 mL (0 mLs Intravenous Stopped 04/22/23 1120)    And  lactated ringers bolus 1,000 mL (0 mLs Intravenous Stopped 04/22/23 1120)    And  lactated ringers bolus 250 mL (0 mLs Intravenous Stopped 04/22/23 1135)     IMPRESSION / MDM / ASSESSMENT AND PLAN / ED COURSE  I reviewed the triage vital signs and the nursing notes.                              82 y.o. male with past medical history of hyperlipidemia, intellectual disability, and cerebral palsy who presents to the ED due to decreasing mental status following recent diagnosis of UTI.  Patient's presentation is most consistent with acute presentation with potential threat to life or bodily  function.  Differential diagnosis includes, but is not limited to, sepsis, UTI, bacteremia, pneumonia, aspiration, respiratory failure, electrolyte abnormality, AKI, anemia.  Patient ill-appearing on arrival, only opens eyes to voice, does localize to pain in all 4 extremities.  Vital signs remarkable for fever, tachycardia, tachypnea, and hypotension.  Patient was seen in the ED 3 days ago and diagnosed with UTI, blood cultures from that visit are now coming back positive with Proteus and Enterobacter.  Patient appears to be in septic shock due to UTI, antibiotic options discussed with pharmacy and we will start on IV Zosyn, draw repeat  blood cultures.  Patient to be given 30 cc/kg of IV fluids, chest x-ray and labs are pending at this time.  Patient received IV Tylenol with EMS, we will place on high flow nasal cannula to help with his work of breathing, do not feel patient is a good candidate for BiPAP given altered mental status and risk for aspiration.  I spoke with patient's legal guardian over the phone, who advised that she and other family members would like for him to remain full code.  Patient's mental status seems to be improving following IV fluid resuscitation and IV antibiotics, now more easily arousable to voice and attempts to respond verbally.  Respiratory status improved on high flow nasal cannula, chest x-ray does show right lower lobe infiltrate concerning for aspiration.  We will add on IV azithromycin to cover for atypicals.  Labs show hypernatremia likely due to dehydration, no AKI, anemia, or leukocytosis noted.  Lactic acid and troponin within normal limits.  Case discussed with hospitalist for admission.      FINAL CLINICAL IMPRESSION(S) / ED DIAGNOSES   Final diagnoses:  Altered mental status, unspecified altered mental status type  Sepsis with acute hypoxic respiratory failure without septic shock, due to unspecified organism (HCC)  Aspiration into airway, initial  encounter  Acute cystitis without hematuria     Rx / DC Orders   ED Discharge Orders     None        Note:  This document was prepared using Dragon voice recognition software and may include unintentional dictation errors.   Chesley Noon, MD 04/22/23 [phone]

## 2023-04-22 NOTE — Sepsis Progress Note (Signed)
Elink following code sepsis °

## 2023-04-22 NOTE — ED Notes (Signed)
Patient awake to voice, patient spoke words that were unrecognizable. Patient was also spitting out white substance from mouth. Looked like food or medication

## 2023-04-22 NOTE — ED Notes (Signed)
Dr Alvester Morin spoke with POA. POA insists that pt still must be full code. Next step would be intubation. ICU being consulted. Pt is very sick appearing with labored respirations and hardly any response to stimulation.

## 2023-04-22 NOTE — ED Triage Notes (Signed)
Patient arrived from SNF that is unknown to EMS or staff, they think its called "[place] [facility] Ethelene Browns [phone]". Patient has been not alert and oriented to staff. Patient is currently sleeping to the side. Patient is alert to voice and touch. Patients temp with EMS was 105.0 temporal EMS gave 1,000mg  of tylenol and temp came down to 102.0 axillary. Patient received of fluid. Baseline mental status is up moving and moving according to EMS. He has been diagnosed with a UTI since Friday. Staff was still feeding and administering PO medications to patient despite patients alertness being low. Room air stats were 85% ems USED A NON-REBREATHER and stats came up into the 90s. Ems asked for Code status but the facility he was picked up from did not give it to EMS nor a MAR. Patient is currently on 2L [place]. And only alert to voice and touch.

## 2023-04-22 NOTE — ED Notes (Signed)
2 other family members at bedside. Dr Alvester Morin back at bedside talking with family.

## 2023-04-22 NOTE — Assessment & Plan Note (Signed)
Lipitor 

## 2023-04-22 NOTE — ED Notes (Signed)
Patient was soaked in urine upon arrival.

## 2023-04-22 NOTE — ED Notes (Signed)
Pt transferred to hospital bed from ED stretcher.

## 2023-04-22 NOTE — Progress Notes (Signed)
Pharmacy Antibiotic Note  [name] is a 82 y.o. male admitted on 04/22/2023 with bacteremia. Patient has a blood culture from 12/27 growing Proteus mirabilis. Pharmacy has been consulted for zosyn dosing.  Plan: Start Zosyn 3.375 g IV q8h Continue to monitor renal function and follow culture results   Height: 5\' 8"  (172.7 cm) (stated by family) IBW/kg (Calculated) : 68.4  Temp (24hrs), Avg:100.4 F (38 C), Min:98.9 F (37.2 C), Max:101.9 F (38.8 C)  Recent Labs  Lab 04/20/23 0942 04/20/23 0953 04/22/23 1002  WBC 10.3  --  9.7  CREATININE 1.11  --  1.21  LATICACIDVEN  --  1.0 1.4    Estimated Creatinine Clearance: 47.1 mL/min (by C-G formula based on SCr of 1.21 mg/dL).    No Known Allergies  Antimicrobials this admission: 12/29 Zosyn >>  12/29 Azithro >>   Dose adjustments this admission: None  Microbiology results: 12/27 BCx: Proteus Mirabilis 12/29 BCx: IP  Thank you for allowing pharmacy to be a part of this patients care.  Merryl Hacker, PharmD Clinical Pharmacist 04/22/2023 1:59 PM

## 2023-04-22 NOTE — Assessment & Plan Note (Addendum)
Meeting sepsis criteria with Tmax of 102, heart rate 100s, respirations into the 30s White count 9.7 Lactate 1.4 Procalcitonin pending Suspect likely multifactorial with contributions of aspiration pneumonia, Proteus bacteremia as well as UTI Started on IV Zosyn in the ER Will add on azithromycin and vancomycin for expanded coverage pending MRSA in discussion w/ Dr. Larinda Buttery  LR IV fluids Continues home oxygen

## 2023-04-22 NOTE — ED Notes (Signed)
Pt cleansed of small amount of bowel incontinence and brief/chuck changed. Blankets removed from pt and single flat sheet remains.

## 2023-04-22 NOTE — ED Notes (Signed)
ICU team in room with family and patient now.

## 2023-04-22 NOTE — ED Notes (Signed)
Blair Hailey, MD about patient status. MD in route to assess patient. Called RT to assess patient as well. RT in route to assess patient. RR: 30 using accessory muscles as well as mouth breathing. O2: 96% on High flow nasal canula. HR: 110. Last BP: 92/46 (60).

## 2023-04-22 NOTE — ED Notes (Signed)
Family at bedside. 

## 2023-04-22 NOTE — Progress Notes (Signed)
CODE SEPSIS - PHARMACY COMMUNICATION  **Broad Spectrum Antibiotics should be administered within 1 hour of Sepsis diagnosis**  Time Code Sepsis Called/Page Received: 1000  Antibiotics Ordered: zosyn  Time of 1st antibiotic administration: 1013  Additional action taken by pharmacy:    If necessary, Name of Provider/Nurse Contacted:      Angelique Blonder ,PharmD Clinical Pharmacist  04/22/2023  10:52 AM

## 2023-04-22 NOTE — ED Notes (Addendum)
Called Patients POA/Emergency contact "[name]" per MD, Alvester Morin.

## 2023-04-22 NOTE — ED Notes (Signed)
Called pharmacy about Vancomycin. Pharmacy sending in tube now.

## 2023-04-22 NOTE — Assessment & Plan Note (Signed)
Recent diagnosis December 27 of UTI-placed on course of Keflex outpatient Urinalysis indicative of infection Noted concomitant Proteus bacteremia IV Zosyn Repeat urine culture Monitor

## 2023-04-22 NOTE — ED Notes (Signed)
Per RT Patient was repositioned and the stridor breath sounds went away. This RN was in a code [place] at time of RT calling.

## 2023-04-22 NOTE — Assessment & Plan Note (Addendum)
Sodium 156 on presentation Clinically dry in the setting of sepsis Will place on half-normal saline IV fluid hydration Trend sodium Check urine and serum studies Monitor

## 2023-04-22 NOTE — ED Notes (Signed)
Family at bedside. Patient awake and alert to family and RN.

## 2023-04-22 NOTE — ED Notes (Addendum)
Messaged MD team about change in lung sounds. Called RT to come down and assess patient

## 2023-04-23 DIAGNOSIS — A419 Sepsis, unspecified organism: Secondary | ICD-10-CM | POA: Diagnosis not present

## 2023-04-23 LAB — COMPREHENSIVE METABOLIC PANEL
ALT: 12 U/L (ref 0–44)
AST: 21 U/L (ref 15–41)
Albumin: 2.3 g/dL — ABNORMAL LOW (ref 3.5–5.0)
Alkaline Phosphatase: 62 U/L (ref 38–126)
Anion gap: 10 (ref 5–15)
BUN: 51 mg/dL — ABNORMAL HIGH (ref 8–23)
CO2: 23 mmol/L (ref 22–32)
Calcium: 7.9 mg/dL — ABNORMAL LOW (ref 8.9–10.3)
Chloride: 119 mmol/L — ABNORMAL HIGH (ref 98–111)
Creatinine, Ser: 1.09 mg/dL (ref 0.61–1.24)
GFR, Estimated: >60 mL/min (ref >60)
Glucose, Bld: 134 mg/dL — ABNORMAL HIGH (ref 70–99)
Potassium: 3.3 mmol/L — ABNORMAL LOW (ref 3.5–5.1)
Sodium: 152 mmol/L — ABNORMAL HIGH (ref 135–145)
Total Bilirubin: 0.9 mg/dL (ref <1.2)
Total Protein: 5.9 g/dL — ABNORMAL LOW (ref 6.5–8.1)

## 2023-04-23 LAB — CULTURE, BLOOD (ROUTINE X 2): Special Requests: ADEQUATE

## 2023-04-23 LAB — SODIUM: Sodium: 156 mmol/L — ABNORMAL HIGH (ref 135–145)

## 2023-04-23 LAB — CBC
HCT: 35.4 % — ABNORMAL LOW (ref 39.0–52.0)
Hemoglobin: 11.6 g/dL — ABNORMAL LOW (ref 13.0–17.0)
MCH: 34.5 pg — ABNORMAL HIGH (ref 26.0–34.0)
MCHC: 32.8 g/dL (ref 30.0–36.0)
MCV: 105.4 fL — ABNORMAL HIGH (ref 80.0–100.0)
Platelets: 111 10*3/uL — ABNORMAL LOW (ref 150–400)
RBC: 3.36 MIL/uL — ABNORMAL LOW (ref 4.22–5.81)
RDW: 14.5 % (ref 11.5–15.5)
WBC: 9.3 10*3/uL (ref 4.0–10.5)
nRBC: 0 % (ref 0.0–0.2)

## 2023-04-23 LAB — URINALYSIS, W/ REFLEX TO CULTURE (INFECTION SUSPECTED)
Bilirubin Urine: NEGATIVE
Glucose, UA: NEGATIVE mg/dL
Ketones, ur: NEGATIVE mg/dL
Nitrite: NEGATIVE
Protein, ur: 100 mg/dL — AB
RBC / HPF: >50 RBC/hpf (ref 0–5)
Specific Gravity, Urine: 1.024 (ref 1.005–1.030)
WBC, UA: >50 WBC/hpf (ref 0–5)
pH: 5 (ref 5.0–8.0)

## 2023-04-23 LAB — CBG MONITORING, ED: Glucose-Capillary: 85 mg/dL (ref 70–99)

## 2023-04-23 MED ORDER — SODIUM CHLORIDE 0.9 % IV SOLN
3.0000 g | Freq: Four times a day (QID) | INTRAVENOUS | Status: DC
  Administered 2023-04-23 – 2023-04-30 (×29): 3 g via INTRAVENOUS
  Filled 2023-04-23 (×30): qty 8

## 2023-04-23 MED ORDER — POTASSIUM CHLORIDE 10 MEQ/100ML IV SOLN
10.0000 meq | INTRAVENOUS | Status: AC
  Administered 2023-04-23 (×2): 10 meq via INTRAVENOUS
  Filled 2023-04-23 (×2): qty 100

## 2023-04-23 MED ORDER — DEXTROSE 5 % IV SOLN: INTRAVENOUS | Status: AC

## 2023-04-23 NOTE — Evaluation (Addendum)
Clinical/Bedside Swallow Evaluation Patient Details  Name: KAYLEN CASAMENTO MRN: [id number] Date of Birth: July 29, 1940  Today's Date: 04/23/2023 Time: SLP Start Time (ACUTE ONLY): 1025 SLP Stop Time (ACUTE ONLY): 1040 SLP Time Calculation (min) (ACUTE ONLY): 15 min  Past Medical History:  Past Medical History:  Diagnosis Date   Anxiety    Atrophy, disuse, muscle    BPH (benign prostatic hyperplasia)    Cerebral palsy (HCC)    Eczema    Elevated lipids    Elevated PSA    Exotropia    History of kidney stones    Hyperlipidemia    Kidney stone    Leukopenia    Lower extremity edema    lymphedema   Mental retardation    Over weight    Prostate cancer (HCC)    Venous insufficiency    Vitamin D deficiency    Wheelchair dependence    Past Surgical History:  Past Surgical History:  Procedure Laterality Date   COLONOSCOPY WITH PROPOFOL N/A 05/10/2020   Procedure: COLONOSCOPY WITH PROPOFOL;  Surgeon: Toney Reil, MD;  Location: [facility] ENDOSCOPY;  Service: Gastroenterology;  Laterality: N/A;   COLONOSCOPY WITH PROPOFOL N/A 05/12/2020   Procedure: COLONOSCOPY WITH PROPOFOL;  Surgeon: Toney Reil, MD;  Location: [facility] ENDOSCOPY;  Service: Gastroenterology;  Laterality: N/A;   CYSTOSCOPY W/ RETROGRADES Left 10/04/2021   Procedure: CYSTOSCOPY WITH RETROGRADE PYELOGRAM;  Surgeon: Riki Altes, MD;  Location: [facility] ORS;  Service: Urology;  Laterality: Left;   CYSTOSCOPY/URETEROSCOPY/HOLMIUM LASER/STENT PLACEMENT Right 08/14/2017   Procedure: CYSTOSCOPY/URETEROSCOPY/HOLMIUM LASER/STENT PLACEMENT;  Surgeon: Riki Altes, MD;  Location: [facility] ORS;  Service: Urology;  Laterality: Right;   CYSTOSCOPY/URETEROSCOPY/HOLMIUM LASER/STENT PLACEMENT Left 10/04/2021   Procedure: CYSTOSCOPY/URETEROSCOPY/HOLMIUM LASER/STENT PLACEMENT/ LITHOPAXY;  Surgeon: Riki Altes, MD;  Location: [facility] ORS;  Service: Urology;  Laterality: Left;   ESOPHAGOGASTRODUODENOSCOPY (EGD) WITH PROPOFOL N/A  05/12/2020   Procedure: ESOPHAGOGASTRODUODENOSCOPY (EGD) WITH PROPOFOL;  Surgeon: Toney Reil, MD;  Location: [facility] ENDOSCOPY;  Service: Gastroenterology;  Laterality: N/A;   INCISION AND DRAINAGE ABSCESS Left 07/05/2021   Procedure: INCISION AND DRAINAGE SCROTAL WALL ABSCESS;  Surgeon: Riki Altes, MD;  Location: [facility] ORS;  Service: Urology;  Laterality: Left;   leg circulation surgery Right    PROSTATE SURGERY  11/10/2008   BRACHYTHERAPY   SCROTAL EXPLORATION Left 07/05/2021   Procedure: SCROTUM EXPLORATION;  Surgeon: Riki Altes, MD;  Location: [facility] ORS;  Service: Urology;  Laterality: Left;   HPI:  DEZMON TORONTO is a 82 y.o. male with medical history significant of intellectual disability, hyperlipidemia, mental retardation, BPH, wheelchair dependent presenting with sepsis, acute respiratory failure with hypoxia, aspiration pneumonia, UTI, hypernatremia, Proteus bacteremia.  Limited history in the setting of electrical disability.  Patient noted to have been evaluated in the ER in December 27.  Was formally diagnosed with issues including UTI.  Was discharged back to group home on Keflex.  Unclear if patient has been receiving medication.  Patient with noted worsening lethargy at group home.  Noted temperature of 105 by EMS. CXR 04/22/23: New right perihilar opacity, which may be due to atelectasis or pneumonia. Pt with fever of 102.4 this date, WBC 9.3.    Assessment / Plan / Recommendation  Clinical Impression  Pt seen for bedside swallow assessment in the setting of concern for aspiration PNA. OF NOTE: pt with orders for a OP MBSS in chart for "choking episode" ordered on 03/15/23- completed assessment not in chart. Pt seen with trials of  thin liquids (via straw, cup, and spoon) and ice chips. Pt with noted immediate cough with cup sips of thin liquids and delayed cough with straw sips. Pt also with noted intermittent delayed vs absent swallow trigger for thin liquids via  spoon. Pt taking impulsive, large straw sips independently, with cough response. Minimal oral manipulation (labial opening maintained and no purposeful lingual movement) noted with trials of thin liquids. Vitals stable for duration of trials.   Based on age, hx of choking, current pulmonary/respiratory status, and current debility, pt is at severe risk of aspiration. Recommend continued NPO. SLP will monitor for PO readiness vs readiness for completion of instrumental assessment. Permit allowance of select ice chips for oral comfort/conditioning following oral hygiene and with close monitoring of respiratory status. SLP Visit Diagnosis: Dysphagia, oropharyngeal phase (R13.12)    Aspiration Risk  Severe aspiration risk    Diet Recommendation   NPO  Medication Administration: Via alternative means    Other  Recommendations Oral Care Recommendations: Oral care QID;Oral care prior to ice chip/H20    Recommendations for follow up therapy are one component of a multi-disciplinary discharge planning process, led by the attending physician.  Recommendations may be updated based on patient status, additional functional criteria and insurance authorization.  Follow up Recommendations Outpatient SLP      Assistance Recommended at Discharge    Functional Status Assessment Patient has had a recent decline in their functional status and demonstrates the ability to make significant improvements in function in a reasonable and predictable amount of time.  Frequency and Duration min 2x/week  2 weeks       Prognosis Prognosis for improved oropharyngeal function: Fair Barriers to Reach Goals: Cognitive deficits;Severity of deficits      Swallow Study   General Date of Onset: 04/23/23 HPI: ALADDIN BOSMA is a 82 y.o. male with medical history significant of intellectual disability, hyperlipidemia, mental retardation, BPH, wheelchair dependent presenting with sepsis, acute respiratory failure with  hypoxia, aspiration pneumonia, UTI, hypernatremia, Proteus bacteremia.  Limited history in the setting of electrical disability.  Patient noted to have been evaluated in the ER in December 27.  Was formally diagnosed with issues including UTI.  Was discharged back to group home on Keflex.  Unclear if patient has been receiving medication.  Patient with noted worsening lethargy at group home.  Noted temperature of 105 by EMS. CXR 04/22/23: New right perihilar opacity, which may be due to atelectasis or pneumonia. Pt with fever of 102.4 this date, WBC 9.3. Type of Study: Bedside Swallow Evaluation Previous Swallow Assessment: BSE completed in Jan 2022, with recommendations for minced solids and thin liquids Diet Prior to this Study: NPO Temperature Spikes Noted: Yes Respiratory Status: Nasal cannula History of Recent Intubation: No Behavior/Cognition: Alert;Requires cueing;Doesn't follow directions Oral Cavity Assessment: Dry Oral Care Completed by SLP: Recent completion by staff Oral Cavity - Dentition: Edentulous Self-Feeding Abilities: Total assist Patient Positioning: Upright in bed Baseline Vocal Quality: Hoarse;Low vocal intensity Volitional Cough: Congested Volitional Swallow: Unable to elicit    Oral/Motor/Sensory Function Overall Oral Motor/Sensory Function: Generalized oral weakness   Ice Chips Ice chips: Impaired Presentation: Spoon Oral Phase Impairments: Poor awareness of bolus Oral Phase Functional Implications: Prolonged oral transit Pharyngeal Phase Impairments: Cough - Delayed   Thin Liquid Thin Liquid: Impaired Presentation: Straw;Spoon;Cup Oral Phase Impairments: Poor awareness of bolus;Reduced lingual movement/coordination Oral Phase Functional Implications: Prolonged oral transit Pharyngeal  Phase Impairments: Cough - Delayed;Cough - Immediate    Nectar Thick Nectar  Thick Liquid: Not tested   Honey Thick Honey Thick Liquid: Not tested   Puree Puree: Not tested    Solid     Solid: Not tested     [place] Josanne Boerema Clapp MS [facility] SLP    [place] J Clapp 04/23/2023,10:55 AM

## 2023-04-23 NOTE — ED Notes (Signed)
Patient given a few sips of water per MD Williams with no complaints or distress. Pt's caregiver at the bedside.

## 2023-04-23 NOTE — ED Notes (Addendum)
Covering NP notified about Pt BP running a little softer Vitals: 99/62 Map 75, O2 97% on 2L, HR 67, RR 18. Easily arouseable and at baseline orientation for his mental condition. Np said he is OK as long as MAP is >65.

## 2023-04-23 NOTE — ED Notes (Signed)
Bed bath given to Pt; cleaned, soiled purewick replaced, brief changed, foam lift dressing placed on Pt's sacrum. And Repositioned off his sacrum, floating heels.

## 2023-04-23 NOTE — Progress Notes (Signed)
PROGRESS NOTE    [name]  ZOX:[id number] DOB: Jul 29, 1940 DOA: 04/22/2023 PCP: Alba Cory, MD   Assessment & Plan:   Principal Problem:   Sepsis [facility]) Active Problems:   Acute respiratory failure with hypoxia (HCC)   Dyslipidemia   Intellectual disability   Bacteremia due to Proteus species   UTI (urinary tract infection)   Hypernatremia  Assessment and Plan: Sepsis:  met criteria w/ fever, tachycardia, tachypnea, leukocytosis, bacteremia & UTI. Continue on IV unasyn  & IVFs  Acute hypoxic respiratory failure: likely secondary to aspiration pneumonia. Continue on IV abxs. Continue on supplemental oxygen and wean as tolerated.    Hypernatremia: free water deficit is 4.1L . D/c half normal saline and start D5W.    UTI: UA w reflex to urine cx ordered. Continue on IV unasyn   Bacteremia: due to Proteus species was positive on 04/20/23 and pt was given keflex which was resistant abx. Continue on IV unasyn. Repeat blood cxs NGTD   Dysphagia: NPO as per speech    Intellectual disability: continue w/ supportive care. Lives at group home. Minimally verbal at baseline   HLD: holding statin while NPO    Thrombocytopenia: etiology unclear. Will continue to monitor   Hypokalemia: potassium given  Macrocytic anemia: will check B12, folate levels   DVT prophylaxis: lovenox  Code Status: full  Family Communication:  Disposition Plan: likely d/c back to group home  Level of care: tele medical Status is: Inpatient Remains inpatient appropriate because: severity of illness    Consultants:    Procedures:   Antimicrobials: unasyn    Subjective: Pt c/o fatigue   Objective: Vitals:   04/23/23 0705 04/23/23 0710 04/23/23 0715 04/23/23 0747  BP: 92/72     Pulse: 77 78 71   Resp: 17 18 17    Temp:    97.6 F (36.4 C)  TempSrc:    Oral  SpO2: 100% 100% 100%   Height:        Intake/Output Summary (Last 24 hours) at 04/23/2023 0856 Last data filed at  04/23/2023 0326 Gross per 24 hour  Intake 1999.83 ml  Output 650 ml  Net 1349.83 ml   There were no vitals filed for this visit.  Examination:  General exam: Appears calm but uncomfortable  Respiratory system: course breath sounds b/l Cardiovascular system: S1 & S2+. No rubs, gallops or clicks. Gastrointestinal system: Abdomen is nondistended, soft and nontender.  Normal bowel sounds heard. Central nervous system: Alert and awake Psychiatry: Judgement and insight appears poor. Flat mood and affect    Data Reviewed: I have personally reviewed following labs and imaging studies  CBC: Recent Labs  Lab 04/20/23 0942 04/22/23 1002 04/23/23 0203  WBC 10.3 9.7 9.3  NEUTROABS 8.6* 8.2*  --   HGB 14.0 13.5 11.6*  HCT 42.3 41.4 35.4*  MCV 103.4* 105.1* 105.4*  PLT 169 173 111*   Basic Metabolic Panel: Recent Labs  Lab 04/20/23 0942 04/22/23 1002 04/22/23 2142 04/23/23 0203 04/23/23 0745  NA 152* 156* 155* 152* 156*  K 3.9 3.7  --  3.3*  --   CL 116* 119*  --  119*  --   CO2 27 24  --  23  --   GLUCOSE 105* 146*  --  134*  --   BUN 51* 58*  --  51*  --   CREATININE 1.11 1.21  --  1.09  --   CALCIUM 9.3 8.9  --  7.9*  --  GFR: Estimated Creatinine Clearance: 52.3 mL/min (by C-G formula based on SCr of 1.09 mg/dL). Liver Function Tests: Recent Labs  Lab 04/20/23 0942 04/22/23 1002 04/23/23 0203  AST 16 16 21   ALT 13 13 12   ALKPHOS 92 80 62  BILITOT 0.9 0.9 0.9  PROT 7.6 7.3 5.9*  ALBUMIN 3.2* 2.9* 2.3*   No results for input(s): "LIPASE", "AMYLASE" in the last 168 hours. No results for input(s): "AMMONIA" in the last 168 hours. Coagulation Profile: Recent Labs  Lab 04/22/23 1002  INR 1.2   Cardiac Enzymes: No results for input(s): "CKTOTAL", "CKMB", "CKMBINDEX", "TROPONINI" in the last 168 hours. BNP (last 3 results) No results for input(s): "PROBNP" in the last 8760 hours. HbA1C: No results for input(s): "HGBA1C" in the last 72 hours. CBG: No  results for input(s): "GLUCAP" in the last 168 hours. Lipid Profile: No results for input(s): "CHOL", "HDL", "LDLCALC", "TRIG", "CHOLHDL", "LDLDIRECT" in the last 72 hours. Thyroid Function Tests: No results for input(s): "TSH", "T4TOTAL", "FREET4", "T3FREE", "THYROIDAB" in the last 72 hours. Anemia Panel: No results for input(s): "VITAMINB12", "FOLATE", "FERRITIN", "TIBC", "IRON", "RETICCTPCT" in the last 72 hours. Sepsis Labs: Recent Labs  Lab 04/20/23 0953 04/22/23 1002  PROCALCITON  --  0.21  LATICACIDVEN 1.0 1.4    Recent Results (from the past 240 hours)  Culture, blood (Routine x 2)     Status: Abnormal   Collection Time: 04/20/23  9:42 AM   Specimen: BLOOD  Result Value Ref Range Status   Specimen Description   Final    BLOOD LEFT ANTECUBITAL Performed at [facility], [address] [place], [place] [address]    Special Requests   Final    BOTTLES DRAWN AEROBIC AND ANAEROBIC Blood Culture adequate volume Performed at [facility], [address] [place], [place] [address]    Culture  Setup Time   Final    GRAM NEGATIVE RODS AEROBIC BOTTLE ONLY CRITICAL RESULT CALLED TO, READ BACK BY AND VERIFIED WITH: [facility] MURRAY AT 7846 04/21/23.PMF    Culture PROTEUS MIRABILIS (A)  Final   Report Status 04/23/2023 FINAL  Final   Organism ID, Bacteria PROTEUS MIRABILIS  Final   Organism ID, Bacteria PROTEUS MIRABILIS  Final      Susceptibility   Proteus mirabilis - KIRBY BAUER*    CEFAZOLIN INTERMEDIATE Intermediate    Proteus mirabilis - MIC*    AMPICILLIN <=2 SENSITIVE Sensitive     CEFEPIME <=0.12 SENSITIVE Sensitive     CEFTAZIDIME <=1 SENSITIVE Sensitive     CEFTRIAXONE <=0.25 SENSITIVE Sensitive     CIPROFLOXACIN <=0.25 SENSITIVE Sensitive     GENTAMICIN <=1 SENSITIVE Sensitive     IMIPENEM 4 SENSITIVE Sensitive     TRIMETH/SULFA <=20 SENSITIVE Sensitive     AMPICILLIN/SULBACTAM <=2 SENSITIVE Sensitive     PIP/TAZO <=4 SENSITIVE Sensitive  ug/mL    * PROTEUS MIRABILIS    PROTEUS MIRABILIS  Blood Culture ID Panel (Reflexed)     Status: Abnormal   Collection Time: 04/20/23  9:42 AM  Result Value Ref Range Status   Enterococcus faecalis NOT DETECTED NOT DETECTED Final   Enterococcus Faecium NOT DETECTED NOT DETECTED Final   Listeria monocytogenes NOT DETECTED NOT DETECTED Final   Staphylococcus species NOT DETECTED NOT DETECTED Final   Staphylococcus aureus (BCID) NOT DETECTED NOT DETECTED Final   Staphylococcus epidermidis NOT DETECTED NOT DETECTED Final   Staphylococcus lugdunensis NOT DETECTED NOT DETECTED Final   Streptococcus species NOT DETECTED NOT DETECTED Final  Streptococcus agalactiae NOT DETECTED NOT DETECTED Final   Streptococcus pneumoniae NOT DETECTED NOT DETECTED Final   Streptococcus pyogenes NOT DETECTED NOT DETECTED Final   A.calcoaceticus-baumannii NOT DETECTED NOT DETECTED Final   Bacteroides fragilis NOT DETECTED NOT DETECTED Final   Enterobacterales DETECTED (A) NOT DETECTED Final    Comment: Enterobacterales represent a large order of gram negative bacteria, not a single organism. CRITICAL RESULT CALLED TO, READ BACK BY AND VERIFIED WITH: ASHLEY MURRAY AT 1610 04/21/23.PMF    Enterobacter cloacae complex NOT DETECTED NOT DETECTED Final   Escherichia coli NOT DETECTED NOT DETECTED Final   Klebsiella aerogenes NOT DETECTED NOT DETECTED Final   Klebsiella oxytoca NOT DETECTED NOT DETECTED Final   Klebsiella pneumoniae NOT DETECTED NOT DETECTED Final   Proteus species DETECTED (A) NOT DETECTED Final    Comment: CRITICAL RESULT CALLED TO, READ BACK BY AND VERIFIED WITH: ASHLEY MURRAY AT 9604 04/21/23.PMF    Salmonella species NOT DETECTED NOT DETECTED Final   Serratia marcescens NOT DETECTED NOT DETECTED Final   Haemophilus influenzae NOT DETECTED NOT DETECTED Final   Neisseria meningitidis NOT DETECTED NOT DETECTED Final   Pseudomonas aeruginosa NOT DETECTED NOT DETECTED Final   Stenotrophomonas  maltophilia NOT DETECTED NOT DETECTED Final   Candida albicans NOT DETECTED NOT DETECTED Final   Candida auris NOT DETECTED NOT DETECTED Final   Candida glabrata NOT DETECTED NOT DETECTED Final   Candida krusei NOT DETECTED NOT DETECTED Final   Candida parapsilosis NOT DETECTED NOT DETECTED Final   Candida tropicalis NOT DETECTED NOT DETECTED Final   Cryptococcus neoformans/gattii NOT DETECTED NOT DETECTED Final   CTX-M ESBL NOT DETECTED NOT DETECTED Final   Carbapenem resistance IMP NOT DETECTED NOT DETECTED Final   Carbapenem resistance KPC NOT DETECTED NOT DETECTED Final   Carbapenem resistance NDM NOT DETECTED NOT DETECTED Final   Carbapenem resist OXA 48 LIKE NOT DETECTED NOT DETECTED Final   Carbapenem resistance VIM NOT DETECTED NOT DETECTED Final    Comment: Performed at [facility], [address] [place], [place] [address]  Culture, blood (Routine x 2)     Status: None (Preliminary result)   Collection Time: 04/20/23  9:53 AM   Specimen: BLOOD  Result Value Ref Range Status   Specimen Description BLOOD RIGHT ANTECUBITAL  Final   Special Requests   Final    BOTTLES DRAWN AEROBIC AND ANAEROBIC Blood Culture adequate volume   Culture   Final    NO GROWTH 3 DAYS Performed at [facility], [address], [place], [place] [address]    Report Status PENDING  Incomplete  Resp panel by RT-PCR (RSV, Flu A&B, Covid) Anterior Nasal Swab     Status: None   Collection Time: 04/20/23  9:59 AM   Specimen: Anterior Nasal Swab  Result Value Ref Range Status   SARS Coronavirus 2 by RT PCR NEGATIVE NEGATIVE Final    Comment: (NOTE) SARS-CoV-2 target nucleic acids are NOT DETECTED.  The SARS-CoV-2 RNA is generally detectable in upper respiratory specimens during the acute phase of infection. The lowest concentration of SARS-CoV-2 viral copies this assay can detect is 138 copies/mL. A negative result does not preclude SARS-Cov-2 infection and should not be used as  the sole basis for treatment or other patient management decisions. A negative result may occur with  improper specimen collection/handling, submission of specimen other than nasopharyngeal swab, presence of viral mutation(s) within the areas targeted by this assay, and inadequate number of viral copies(<138 copies/mL). A negative  result must be combined with clinical observations, patient history, and epidemiological information. The expected result is Negative.  Fact Sheet for Patients:  BloggerCourse.com  Fact Sheet for Healthcare Providers:  SeriousBroker.it  This test is no t yet approved or cleared by the [place] FDA and  has been authorized for detection and/or diagnosis of SARS-CoV-2 by FDA under an Emergency Use Authorization (EUA). This EUA will remain  in effect (meaning this test can be used) for the duration of the COVID-19 declaration under Section 564(b)(1) of the Act, 21 U.S.C.section 360bbb-3(b)(1), unless the authorization is terminated  or revoked sooner.       Influenza A by PCR NEGATIVE NEGATIVE Final   Influenza B by PCR NEGATIVE NEGATIVE Final    Comment: (NOTE) The Xpert Xpress SARS-CoV-2/FLU/RSV plus assay is intended as an aid in the diagnosis of influenza from Nasopharyngeal swab specimens and should not be used as a sole basis for treatment. Nasal washings and aspirates are unacceptable for Xpert Xpress SARS-CoV-2/FLU/RSV testing.  Fact Sheet for Patients: BloggerCourse.com  Fact Sheet for Healthcare Providers: SeriousBroker.it  This test is not yet approved or cleared by the [place] FDA and has been authorized for detection and/or diagnosis of SARS-CoV-2 by FDA under an Emergency Use Authorization (EUA). This EUA will remain in effect (meaning this test can be used) for the duration of the COVID-19 declaration under Section 564(b)(1) of  the Act, 21 U.S.C. section 360bbb-3(b)(1), unless the authorization is terminated or revoked.     Resp Syncytial Virus by PCR NEGATIVE NEGATIVE Final    Comment: (NOTE) Fact Sheet for Patients: BloggerCourse.com  Fact Sheet for Healthcare Providers: SeriousBroker.it  This test is not yet approved or cleared by the [place] FDA and has been authorized for detection and/or diagnosis of SARS-CoV-2 by FDA under an Emergency Use Authorization (EUA). This EUA will remain in effect (meaning this test can be used) for the duration of the COVID-19 declaration under Section 564(b)(1) of the Act, 21 U.S.C. section 360bbb-3(b)(1), unless the authorization is terminated or revoked.  Performed at [facility], [address] [place], [place] [address]   Blood Culture (routine x 2)     Status: None (Preliminary result)   Collection Time: 04/22/23 10:02 AM   Specimen: BLOOD  Result Value Ref Range Status   Specimen Description BLOOD BLOOD RIGHT WRIST  Final   Special Requests   Final    BOTTLES DRAWN AEROBIC AND ANAEROBIC Blood Culture adequate volume   Culture   Final    NO GROWTH < 24 HOURS Performed at [facility], [address] [place], [place] [address]    Report Status PENDING  Incomplete  Blood Culture (routine x 2)     Status: None (Preliminary result)   Collection Time: 04/22/23 10:03 AM   Specimen: BLOOD  Result Value Ref Range Status   Specimen Description BLOOD RIGHT ANTECUBITAL  Final   Special Requests   Final    BOTTLES DRAWN AEROBIC AND ANAEROBIC Blood Culture adequate volume   Culture   Final    NO GROWTH < 24 HOURS Performed at [facility], [address], [place], [place] [address]    Report Status PENDING  Incomplete  Resp panel by RT-PCR (RSV, Flu A&B, Covid) Anterior Nasal Swab     Status: None   Collection Time: 04/22/23 10:18 AM   Specimen: Anterior Nasal Swab  Result  Value Ref Range Status   SARS Coronavirus 2 by RT PCR NEGATIVE  NEGATIVE Final    Comment: (NOTE) SARS-CoV-2 target nucleic acids are NOT DETECTED.  The SARS-CoV-2 RNA is generally detectable in upper respiratory specimens during the acute phase of infection. The lowest concentration of SARS-CoV-2 viral copies this assay can detect is 138 copies/mL. A negative result does not preclude SARS-Cov-2 infection and should not be used as the sole basis for treatment or other patient management decisions. A negative result may occur with  improper specimen collection/handling, submission of specimen other than nasopharyngeal swab, presence of viral mutation(s) within the areas targeted by this assay, and inadequate number of viral copies(<138 copies/mL). A negative result must be combined with clinical observations, patient history, and epidemiological information. The expected result is Negative.  Fact Sheet for Patients:  BloggerCourse.com  Fact Sheet for Healthcare Providers:  SeriousBroker.it  This test is no t yet approved or cleared by the [place] FDA and  has been authorized for detection and/or diagnosis of SARS-CoV-2 by FDA under an Emergency Use Authorization (EUA). This EUA will remain  in effect (meaning this test can be used) for the duration of the COVID-19 declaration under Section 564(b)(1) of the Act, 21 U.S.C.section 360bbb-3(b)(1), unless the authorization is terminated  or revoked sooner.       Influenza A by PCR NEGATIVE NEGATIVE Final   Influenza B by PCR NEGATIVE NEGATIVE Final    Comment: (NOTE) The Xpert Xpress SARS-CoV-2/FLU/RSV plus assay is intended as an aid in the diagnosis of influenza from Nasopharyngeal swab specimens and should not be used as a sole basis for treatment. Nasal washings and aspirates are unacceptable for Xpert Xpress SARS-CoV-2/FLU/RSV testing.  Fact Sheet for  Patients: BloggerCourse.com  Fact Sheet for Healthcare Providers: SeriousBroker.it  This test is not yet approved or cleared by the [place] FDA and has been authorized for detection and/or diagnosis of SARS-CoV-2 by FDA under an Emergency Use Authorization (EUA). This EUA will remain in effect (meaning this test can be used) for the duration of the COVID-19 declaration under Section 564(b)(1) of the Act, 21 U.S.C. section 360bbb-3(b)(1), unless the authorization is terminated or revoked.     Resp Syncytial Virus by PCR NEGATIVE NEGATIVE Final    Comment: (NOTE) Fact Sheet for Patients: BloggerCourse.com  Fact Sheet for Healthcare Providers: SeriousBroker.it  This test is not yet approved or cleared by the [place] FDA and has been authorized for detection and/or diagnosis of SARS-CoV-2 by FDA under an Emergency Use Authorization (EUA). This EUA will remain in effect (meaning this test can be used) for the duration of the COVID-19 declaration under Section 564(b)(1) of the Act, 21 U.S.C. section 360bbb-3(b)(1), unless the authorization is terminated or revoked.  Performed at [facility], [address] [place], [place] [address]   MRSA Next Gen by PCR, Nasal     Status: None   Collection Time: 04/22/23  1:46 PM   Specimen: Nasal Mucosa; Nasal Swab  Result Value Ref Range Status   MRSA by PCR Next Gen NOT DETECTED NOT DETECTED Final    Comment: (NOTE) The GeneXpert MRSA Assay (FDA approved for NASAL specimens only), is one component of a comprehensive MRSA colonization surveillance program. It is not intended to diagnose MRSA infection nor to guide or monitor treatment for MRSA infections. Test performance is not FDA approved in patients less than 76 years old. Performed at [facility], [address], [place], [place] [address]           Radiology Studies: [facility] Chest [place]  1 View Result Date: 04/22/2023 CLINICAL DATA:  Altered mental status.  Fever.  sepsis. EXAM: PORTABLE CHEST 1 VIEW COMPARISON:  04/20/2023 FINDINGS: The heart size and mediastinal contours are within normal limits. New opacity is seen in the right perihilar region, which may be due to atelectasis or pneumonia. Left lung is clear. No pleural effusion seen. IMPRESSION: New right perihilar opacity, which may be due to atelectasis or pneumonia. Electronically Signed   By: Danae Orleans M.D.   On: 04/22/2023 10:38        Scheduled Meds:  enoxaparin (LOVENOX) injection  40 mg Subcutaneous Q24H   Continuous Infusions:  azithromycin     dextrose     piperacillin-tazobactam (ZOSYN)  IV 3.375 g (04/23/23 0513)     LOS: 1 day       Charise Killian, MD [facility] Pager [phone]  If 7PM-7AM, please contact night-coverage [email or web address] 04/23/2023, 8:56 AM

## 2023-04-23 NOTE — ED Notes (Addendum)
Messaged Covering NP to confirm reason for order of continous 5% dextrose He got admitted for a sepsis workup and CBG 85 This RN sees an order for 5% dextrose with no indication. Pt is NPO. Pts vitals are BP running soft at 100/51 map of 67, 97% on 2L O2 Hr: 70 and 17 RR. Asked covering NP if we keep him on the 5% dextrose continuous infusion and if Pt needed a fluid bolus. Covering stated Pt needs only D5 because of his hypernatremia. And Pt's MAP is adequate for now, so no fluid bolus yet. Covering might adjust the rate of IVF to 100 overnight.

## 2023-04-24 ENCOUNTER — Inpatient Hospital Stay: Payer: 59

## 2023-04-24 ENCOUNTER — Other Ambulatory Visit: Payer: Self-pay

## 2023-04-24 DIAGNOSIS — A419 Sepsis, unspecified organism: Secondary | ICD-10-CM | POA: Diagnosis not present

## 2023-04-24 LAB — COMPREHENSIVE METABOLIC PANEL
ALT: 12 U/L (ref 0–44)
AST: 21 U/L (ref 15–41)
Albumin: 2 g/dL — ABNORMAL LOW (ref 3.5–5.0)
Alkaline Phosphatase: 45 U/L (ref 38–126)
Anion gap: 4 — ABNORMAL LOW (ref 5–15)
BUN: 36 mg/dL — ABNORMAL HIGH (ref 8–23)
CO2: 22 mmol/L (ref 22–32)
Calcium: 7.3 mg/dL — ABNORMAL LOW (ref 8.9–10.3)
Chloride: 122 mmol/L — ABNORMAL HIGH (ref 98–111)
Creatinine, Ser: 0.77 mg/dL (ref 0.61–1.24)
GFR, Estimated: >60 mL/min (ref >60)
Glucose, Bld: 236 mg/dL — ABNORMAL HIGH (ref 70–99)
Potassium: 3.2 mmol/L — ABNORMAL LOW (ref 3.5–5.1)
Sodium: 148 mmol/L — ABNORMAL HIGH (ref 135–145)
Total Bilirubin: 0.9 mg/dL (ref 0.0–1.2)
Total Protein: 5.1 g/dL — ABNORMAL LOW (ref 6.5–8.1)

## 2023-04-24 LAB — CBC
HCT: 29.4 % — ABNORMAL LOW (ref 39.0–52.0)
Hemoglobin: 9.6 g/dL — ABNORMAL LOW (ref 13.0–17.0)
MCH: 33.8 pg (ref 26.0–34.0)
MCHC: 32.7 g/dL (ref 30.0–36.0)
MCV: 103.5 fL — ABNORMAL HIGH (ref 80.0–100.0)
Platelets: 94 10*3/uL — ABNORMAL LOW (ref 150–400)
RBC: 2.84 MIL/uL — ABNORMAL LOW (ref 4.22–5.81)
RDW: 14.5 % (ref 11.5–15.5)
WBC: 6.3 10*3/uL (ref 4.0–10.5)
nRBC: 0 % (ref 0.0–0.2)

## 2023-04-24 LAB — FOLATE: Folate: 27 ng/mL (ref >5.9)

## 2023-04-24 LAB — VITAMIN B12: Vitamin B-12: 970 pg/mL — ABNORMAL HIGH (ref 180–914)

## 2023-04-24 MED ORDER — CALCIUM GLUCONATE-NACL 1-0.675 GM/50ML-% IV SOLN
1.0000 g | Freq: Once | INTRAVENOUS | Status: AC
  Administered 2023-04-24: 1000 mg via INTRAVENOUS
  Filled 2023-04-24: qty 50

## 2023-04-24 MED ORDER — POTASSIUM CHLORIDE 10 MEQ/100ML IV SOLN
10.0000 meq | INTRAVENOUS | Status: AC
  Administered 2023-04-24 (×4): 10 meq via INTRAVENOUS
  Filled 2023-04-24 (×3): qty 100

## 2023-04-24 MED ORDER — ATORVASTATIN CALCIUM 20 MG PO TABS
40.0000 mg | ORAL_TABLET | Freq: Every day | ORAL | Status: DC
  Administered 2023-04-24 – 2023-05-01 (×8): 40 mg via ORAL
  Filled 2023-04-24 (×8): qty 2

## 2023-04-24 MED ORDER — AZITHROMYCIN 500 MG PO TABS
500.0000 mg | ORAL_TABLET | Freq: Every day | ORAL | Status: AC
  Administered 2023-04-25 – 2023-04-26 (×2): 500 mg via ORAL
  Filled 2023-04-24 (×2): qty 1

## 2023-04-24 MED ORDER — SERTRALINE HCL 50 MG PO TABS
100.0000 mg | ORAL_TABLET | Freq: Every day | ORAL | Status: DC
  Administered 2023-04-24 – 2023-05-01 (×8): 100 mg via ORAL
  Filled 2023-04-24 (×8): qty 2

## 2023-04-24 NOTE — ED Notes (Signed)
Called sister Dennie Bible) to notify of room assignment; left voicemail.

## 2023-04-24 NOTE — ED Notes (Signed)
Patient cleaned of urinary and bowel incontinence, new depend and chux applied.

## 2023-04-24 NOTE — Progress Notes (Signed)
 PROGRESS NOTE   HPI was taken from Dr. Eldonna: [name] is a 82 y.o. male with medical history significant of intellectual disability, hyperlipidemia, mental retardation, BPH, wheelchair dependent presenting with sepsis, acute respiratory failure with hypoxia, aspiration pneumonia, UTI, hypernatremia, Proteus bacteremia.  Limited history in the setting of electrical disability.  Patient noted to have been evaluated in the ER in December 27.  Was formally diagnosed with issues including UTI.  Was discharged back to group home on Keflex .  Unclear if patient has been receiving medication.  Patient with noted worsening lethargy at group home.  Noted temperature of 105 by EMS.  Room sats were 85% on EMS evaluation requiring a nonrebreather. Presented to the ER Tmax of 102, heart rate 100s, respirations mid to upper 20s, BP stable.  Satting 93% on 10 L high flow nasal cannula.  White count 9.7, hemoglobin 13.5, platelets 173, VBG grossly stable, COVID flu and RSV negative, urinalysis indicative of infection, lactate 1.  Creatinine 1.2.  Troponin within normal limits.  Procalcitonin 0.21.  Chest x-ray with right hilar opacity concerning for atelectasis versus pneumonia.    [name]  FMW:[id number] DOB: [birth year] DOA: 04/22/2023 PCP: Glenard Mire, MD   Assessment & Plan:   Principal Problem:   Sepsis [facility]) Active Problems:   Acute respiratory failure with hypoxia (HCC)   Dyslipidemia   Intellectual disability   Bacteremia due to Proteus species   UTI (urinary tract infection)   Hypernatremia  Assessment and Plan: Sepsis:  met criteria w/ fever, tachycardia, tachypnea, leukocytosis, bacteremia & UTI. Continue on IV unasyn   & IVFs  Acute hypoxic respiratory failure: likely secondary to aspiration pneumonia. Continue on IV abxs. Continue on supplemental oxygen  and wean as tolerated    Hypernatremia: free water  deficit is 2.0L .Continue on D5W. Improving    Possible UTI:  UA  is positive for leukocytes & few bacteria but did not reflex to cx. Continue on IV unasyn      Bacteremia: due to Proteus species was positive on 04/20/23 and pt was given keflex  which was resistant abx. Continue on IV unasyn . Repeat blood cx NGTD  Dysphagia: dysphagia I diet as per speech    Intellectual disability: continue w/ supportive care. Lives at a group home. Minimally verbal at baseline   HLD:  restart statin    Thrombocytopenia: etiology unclear. Labile   Hypokalemia: KCl given   Macrocytic anemia: folate is WNL. B12 is pending   DVT prophylaxis: lovenox   Code Status: full  Family Communication:  Disposition Plan: likely d/c back to group home  Level of care: tele medical Status is: Inpatient Remains inpatient appropriate because: severity of illness    Consultants:    Procedures:   Antimicrobials: unasyn     Subjective: Pt c/o weakness   Objective: Vitals:   04/24/23 0630 04/24/23 0700 04/24/23 0730 04/24/23 0830  BP: 114/64 97/80 99/66  111/68  Pulse: 68 61 63 63  Resp: 15 16 20 19   Temp:      TempSrc:      SpO2: 99% 99% 100% 97%  Height:        Intake/Output Summary (Last 24 hours) at 04/24/2023 0856 Last data filed at 04/24/2023 0533 Gross per 24 hour  Intake 200 ml  Output --  Net 200 ml   There were no vitals filed for this visit.  Examination:  General exam: appears uncomfortable  Respiratory system: diminished breath sounds b/l Cardiovascular system: S1/S2+. No rubs or clicks  Gastrointestinal system: abd is  soft, NT, ND & hypoactive bowel sounds Central nervous system: alert and awake  Psychiatry: Judgement and insight appears poor. Flat mood and affect    Data Reviewed: I have personally reviewed following labs and imaging studies  CBC: Recent Labs  Lab 04/20/23 0942 04/22/23 1002 04/23/23 0203 04/24/23 0411  WBC 10.3 9.7 9.3 6.3  NEUTROABS 8.6* 8.2*  --   --   HGB 14.0 13.5 11.6* 9.6*  HCT 42.3 41.4 35.4* 29.4*  MCV  103.4* 105.1* 105.4* 103.5*  PLT 169 173 111* 94*   Basic Metabolic Panel: Recent Labs  Lab 04/20/23 0942 04/22/23 1002 04/22/23 2142 04/23/23 0203 04/23/23 0745 04/24/23 0411  NA 152* 156* 155* 152* 156* 148*  K 3.9 3.7  --  3.3*  --  3.2*  CL 116* 119*  --  119*  --  122*  CO2 27 24  --  23  --  22  GLUCOSE 105* 146*  --  134*  --  236*  BUN 51* 58*  --  51*  --  36*  CREATININE 1.11 1.21  --  1.09  --  0.77  CALCIUM  9.3 8.9  --  7.9*  --  7.3*   GFR: Estimated Creatinine Clearance: 71.3 mL/min (by C-G formula based on SCr of 0.77 mg/dL). Liver Function Tests: Recent Labs  Lab 04/20/23 0942 04/22/23 1002 04/23/23 0203 04/24/23 0411  AST 16 16 21 21   ALT 13 13 12 12   ALKPHOS 92 80 62 45  BILITOT 0.9 0.9 0.9 0.9  PROT 7.6 7.3 5.9* 5.1*  ALBUMIN 3.2* 2.9* 2.3* 2.0*   No results for input(s): LIPASE, AMYLASE in the last 168 hours. No results for input(s): AMMONIA in the last 168 hours. Coagulation Profile: Recent Labs  Lab 04/22/23 1002  INR 1.2   Cardiac Enzymes: No results for input(s): CKTOTAL, CKMB, CKMBINDEX, TROPONINI in the last 168 hours. BNP (last 3 results) No results for input(s): PROBNP in the last 8760 hours. HbA1C: No results for input(s): HGBA1C in the last 72 hours. CBG: Recent Labs  Lab 04/23/23 2051  GLUCAP 85   Lipid Profile: No results for input(s): CHOL, HDL, LDLCALC, TRIG, CHOLHDL, LDLDIRECT in the last 72 hours. Thyroid  Function Tests: No results for input(s): TSH, T4TOTAL, FREET4, T3FREE, THYROIDAB in the last 72 hours. Anemia Panel: Recent Labs    04/24/23 0411  FOLATE 27.0   Sepsis Labs: Recent Labs  Lab 04/20/23 0953 04/22/23 1002  PROCALCITON  --  0.21  LATICACIDVEN 1.0 1.4    Recent Results (from the past 240 hours)  Culture, blood (Routine x 2)     Status: Abnormal   Collection Time: 04/20/23  9:42 AM   Specimen: BLOOD  Result Value Ref Range Status   Specimen  Description   Final    BLOOD LEFT ANTECUBITAL Performed at [facility], [address], [place], [place] [address]    Special Requests   Final    BOTTLES DRAWN AEROBIC AND ANAEROBIC Blood Culture adequate volume Performed at [facility], [address] [place], [place] [address]    Culture  Setup Time   Final    GRAM NEGATIVE RODS AEROBIC BOTTLE ONLY CRITICAL RESULT CALLED TO, READ BACK BY AND VERIFIED WITH: [facility] MURRAY AT 9256 04/21/23.PMF    Culture PROTEUS MIRABILIS (A)  Final   Report Status 04/23/2023 FINAL  Final   Organism ID, Bacteria PROTEUS MIRABILIS  Final   Organism ID, Bacteria PROTEUS MIRABILIS  Final  Susceptibility   Proteus mirabilis - KIRBY BAUER*    CEFAZOLIN  INTERMEDIATE Intermediate    Proteus mirabilis - MIC*    AMPICILLIN  <=2 SENSITIVE Sensitive     CEFEPIME  <=0.12 SENSITIVE Sensitive     CEFTAZIDIME <=1 SENSITIVE Sensitive     CEFTRIAXONE  <=0.25 SENSITIVE Sensitive     CIPROFLOXACIN  <=0.25 SENSITIVE Sensitive     GENTAMICIN  <=1 SENSITIVE Sensitive     IMIPENEM 4 SENSITIVE Sensitive     TRIMETH /SULFA  <=20 SENSITIVE Sensitive     AMPICILLIN /SULBACTAM <=2 SENSITIVE Sensitive     PIP/TAZO <=4 SENSITIVE Sensitive ug/mL    * PROTEUS MIRABILIS    PROTEUS MIRABILIS  Blood Culture ID Panel (Reflexed)     Status: Abnormal   Collection Time: 04/20/23  9:42 AM  Result Value Ref Range Status   Enterococcus faecalis NOT DETECTED NOT DETECTED Final   Enterococcus Faecium NOT DETECTED NOT DETECTED Final   Listeria monocytogenes NOT DETECTED NOT DETECTED Final   Staphylococcus species NOT DETECTED NOT DETECTED Final   Staphylococcus aureus (BCID) NOT DETECTED NOT DETECTED Final   Staphylococcus epidermidis NOT DETECTED NOT DETECTED Final   Staphylococcus lugdunensis NOT DETECTED NOT DETECTED Final   Streptococcus species NOT DETECTED NOT DETECTED Final   Streptococcus agalactiae NOT DETECTED NOT DETECTED Final   Streptococcus pneumoniae  NOT DETECTED NOT DETECTED Final   Streptococcus pyogenes NOT DETECTED NOT DETECTED Final   A.calcoaceticus-baumannii NOT DETECTED NOT DETECTED Final   Bacteroides fragilis NOT DETECTED NOT DETECTED Final   Enterobacterales DETECTED (A) NOT DETECTED Final    Comment: Enterobacterales represent a large order of gram negative bacteria, not a single organism. CRITICAL RESULT CALLED TO, READ BACK BY AND VERIFIED WITH: ASHLEY MURRAY AT 9256 04/21/23.PMF    Enterobacter cloacae complex NOT DETECTED NOT DETECTED Final   Escherichia coli NOT DETECTED NOT DETECTED Final   Klebsiella aerogenes NOT DETECTED NOT DETECTED Final   Klebsiella oxytoca NOT DETECTED NOT DETECTED Final   Klebsiella pneumoniae NOT DETECTED NOT DETECTED Final   Proteus species DETECTED (A) NOT DETECTED Final    Comment: CRITICAL RESULT CALLED TO, READ BACK BY AND VERIFIED WITH: ASHLEY MURRAY AT 9256 04/21/23.PMF    Salmonella species NOT DETECTED NOT DETECTED Final   Serratia marcescens NOT DETECTED NOT DETECTED Final   Haemophilus influenzae NOT DETECTED NOT DETECTED Final   Neisseria meningitidis NOT DETECTED NOT DETECTED Final   Pseudomonas aeruginosa NOT DETECTED NOT DETECTED Final   Stenotrophomonas maltophilia NOT DETECTED NOT DETECTED Final   Candida albicans NOT DETECTED NOT DETECTED Final   Candida auris NOT DETECTED NOT DETECTED Final   Candida glabrata NOT DETECTED NOT DETECTED Final   Candida krusei NOT DETECTED NOT DETECTED Final   Candida parapsilosis NOT DETECTED NOT DETECTED Final   Candida tropicalis NOT DETECTED NOT DETECTED Final   Cryptococcus neoformans/gattii NOT DETECTED NOT DETECTED Final   CTX-M ESBL NOT DETECTED NOT DETECTED Final   Carbapenem resistance IMP NOT DETECTED NOT DETECTED Final   Carbapenem resistance KPC NOT DETECTED NOT DETECTED Final   Carbapenem resistance NDM NOT DETECTED NOT DETECTED Final   Carbapenem resist OXA 48 LIKE NOT DETECTED NOT DETECTED Final   Carbapenem resistance  VIM NOT DETECTED NOT DETECTED Final    Comment: Performed at [facility], [address] [place], [place] [address]  Culture, blood (Routine x 2)     Status: None (Preliminary result)   Collection Time: 04/20/23  9:53 AM   Specimen: BLOOD  Result Value Ref Range Status  Specimen Description BLOOD RIGHT ANTECUBITAL  Final   Special Requests   Final    BOTTLES DRAWN AEROBIC AND ANAEROBIC Blood Culture adequate volume   Culture   Final    NO GROWTH 4 DAYS Performed at [facility], [address], [place], [place] [address]    Report Status PENDING  Incomplete  Resp panel by RT-PCR (RSV, Flu A&B, Covid) Anterior Nasal Swab     Status: None   Collection Time: 04/20/23  9:59 AM   Specimen: Anterior Nasal Swab  Result Value Ref Range Status   SARS Coronavirus 2 by RT PCR NEGATIVE NEGATIVE Final    Comment: (NOTE) SARS-CoV-2 target nucleic acids are NOT DETECTED.  The SARS-CoV-2 RNA is generally detectable in upper respiratory specimens during the acute phase of infection. The lowest concentration of SARS-CoV-2 viral copies this assay can detect is 138 copies/mL. A negative result does not preclude SARS-Cov-2 infection and should not be used as the sole basis for treatment or other patient management decisions. A negative result may occur with  improper specimen collection/handling, submission of specimen other than nasopharyngeal swab, presence of viral mutation(s) within the areas targeted by this assay, and inadequate number of viral copies(<138 copies/mL). A negative result must be combined with clinical observations, patient history, and epidemiological information. The expected result is Negative.  Fact Sheet for Patients:  bloggercourse.com  Fact Sheet for Healthcare Providers:  seriousbroker.it  This test is no t yet approved or cleared by the [place]  FDA and  has been authorized for detection  and/or diagnosis of SARS-CoV-2 by FDA under an Emergency Use Authorization (EUA). This EUA will remain  in effect (meaning this test can be used) for the duration of the COVID-19 declaration under Section 564(b)(1) of the Act, 21 U.S.C.section 360bbb-3(b)(1), unless the authorization is terminated  or revoked sooner.       Influenza A by PCR NEGATIVE NEGATIVE Final   Influenza B by PCR NEGATIVE NEGATIVE Final    Comment: (NOTE) The Xpert Xpress SARS-CoV-2/FLU/RSV plus assay is intended as an aid in the diagnosis of influenza from Nasopharyngeal swab specimens and should not be used as a sole basis for treatment. Nasal washings and aspirates are unacceptable for Xpert Xpress SARS-CoV-2/FLU/RSV testing.  Fact Sheet for Patients: bloggercourse.com  Fact Sheet for Healthcare Providers: seriousbroker.it  This test is not yet approved or cleared by the [place]  FDA and has been authorized for detection and/or diagnosis of SARS-CoV-2 by FDA under an Emergency Use Authorization (EUA). This EUA will remain in effect (meaning this test can be used) for the duration of the COVID-19 declaration under Section 564(b)(1) of the Act, 21 U.S.C. section 360bbb-3(b)(1), unless the authorization is terminated or revoked.     Resp Syncytial Virus by PCR NEGATIVE NEGATIVE Final    Comment: (NOTE) Fact Sheet for Patients: bloggercourse.com  Fact Sheet for Healthcare Providers: seriousbroker.it  This test is not yet approved or cleared by the [place]  FDA and has been authorized for detection and/or diagnosis of SARS-CoV-2 by FDA under an Emergency Use Authorization (EUA). This EUA will remain in effect (meaning this test can be used) for the duration of the COVID-19 declaration under Section 564(b)(1) of the Act, 21 U.S.C. section 360bbb-3(b)(1), unless the authorization is terminated  or revoked.  Performed at [facility], [address] [place], [place] [address]   Blood Culture (routine x 2)     Status: None (Preliminary result)   Collection Time: 04/22/23 10:02 AM  Specimen: BLOOD  Result Value Ref Range Status   Specimen Description BLOOD BLOOD RIGHT WRIST  Final   Special Requests   Final    BOTTLES DRAWN AEROBIC AND ANAEROBIC Blood Culture adequate volume   Culture   Final    NO GROWTH 2 DAYS Performed at [facility], [address] [place], [place] [address]    Report Status PENDING  Incomplete  Blood Culture (routine x 2)     Status: None (Preliminary result)   Collection Time: 04/22/23 10:03 AM   Specimen: BLOOD  Result Value Ref Range Status   Specimen Description BLOOD RIGHT ANTECUBITAL  Final   Special Requests   Final    BOTTLES DRAWN AEROBIC AND ANAEROBIC Blood Culture adequate volume   Culture   Final    NO GROWTH 2 DAYS Performed at [facility], [address], [place], [place] [address]    Report Status PENDING  Incomplete  Resp panel by RT-PCR (RSV, Flu A&B, Covid) Anterior Nasal Swab     Status: None   Collection Time: 04/22/23 10:18 AM   Specimen: Anterior Nasal Swab  Result Value Ref Range Status   SARS Coronavirus 2 by RT PCR NEGATIVE NEGATIVE Final    Comment: (NOTE) SARS-CoV-2 target nucleic acids are NOT DETECTED.  The SARS-CoV-2 RNA is generally detectable in upper respiratory specimens during the acute phase of infection. The lowest concentration of SARS-CoV-2 viral copies this assay can detect is 138 copies/mL. A negative result does not preclude SARS-Cov-2 infection and should not be used as the sole basis for treatment or other patient management decisions. A negative result may occur with  improper specimen collection/handling, submission of specimen other than nasopharyngeal swab, presence of viral mutation(s) within the areas targeted by this assay, and inadequate number of  viral copies(<138 copies/mL). A negative result must be combined with clinical observations, patient history, and epidemiological information. The expected result is Negative.  Fact Sheet for Patients:  bloggercourse.com  Fact Sheet for Healthcare Providers:  seriousbroker.it  This test is no t yet approved or cleared by the [place]  FDA and  has been authorized for detection and/or diagnosis of SARS-CoV-2 by FDA under an Emergency Use Authorization (EUA). This EUA will remain  in effect (meaning this test can be used) for the duration of the COVID-19 declaration under Section 564(b)(1) of the Act, 21 U.S.C.section 360bbb-3(b)(1), unless the authorization is terminated  or revoked sooner.       Influenza A by PCR NEGATIVE NEGATIVE Final   Influenza B by PCR NEGATIVE NEGATIVE Final    Comment: (NOTE) The Xpert Xpress SARS-CoV-2/FLU/RSV plus assay is intended as an aid in the diagnosis of influenza from Nasopharyngeal swab specimens and should not be used as a sole basis for treatment. Nasal washings and aspirates are unacceptable for Xpert Xpress SARS-CoV-2/FLU/RSV testing.  Fact Sheet for Patients: bloggercourse.com  Fact Sheet for Healthcare Providers: seriousbroker.it  This test is not yet approved or cleared by the [place]  FDA and has been authorized for detection and/or diagnosis of SARS-CoV-2 by FDA under an Emergency Use Authorization (EUA). This EUA will remain in effect (meaning this test can be used) for the duration of the COVID-19 declaration under Section 564(b)(1) of the Act, 21 U.S.C. section 360bbb-3(b)(1), unless the authorization is terminated or revoked.     Resp Syncytial Virus by PCR NEGATIVE NEGATIVE Final    Comment: (NOTE) Fact Sheet for Patients: bloggercourse.com  Fact Sheet for Healthcare  Providers: seriousbroker.it  This test is not yet approved or cleared by the [place]  FDA and has been authorized for detection and/or diagnosis of SARS-CoV-2 by FDA under an Emergency Use Authorization (EUA). This EUA will remain in effect (meaning this test can be used) for the duration of the COVID-19 declaration under Section 564(b)(1) of the Act, 21 U.S.C. section 360bbb-3(b)(1), unless the authorization is terminated or revoked.  Performed at [facility], [address] [place], [place] [address]   MRSA Next Gen by PCR, Nasal     Status: None   Collection Time: 04/22/23  1:46 PM   Specimen: Nasal Mucosa; Nasal Swab  Result Value Ref Range Status   MRSA by PCR Next Gen NOT DETECTED NOT DETECTED Final    Comment: (NOTE) The GeneXpert MRSA Assay (FDA approved for NASAL specimens only), is one component of a comprehensive MRSA colonization surveillance program. It is not intended to diagnose MRSA infection nor to guide or monitor treatment for MRSA infections. Test performance is not FDA approved in patients less than 25 years old. Performed at [facility], [address] [place], [place] [address]          Radiology Studies: [facility] Chest [place] 1 View Result Date: 04/22/2023 CLINICAL DATA:  Altered mental status.  Fever.  sepsis. EXAM: PORTABLE CHEST 1 VIEW COMPARISON:  04/20/2023 FINDINGS: The heart size and mediastinal contours are within normal limits. New opacity is seen in the right perihilar region, which may be due to atelectasis or pneumonia. Left lung is clear. No pleural effusion seen. IMPRESSION: New right perihilar opacity, which may be due to atelectasis or pneumonia. Electronically Signed   By: Norleen DELENA Kil M.D.   On: 04/22/2023 10:38        Scheduled Meds:  enoxaparin  (LOVENOX ) injection  40 mg Subcutaneous Q24H   Continuous Infusions:  ampicillin -sulbactam (UNASYN ) IV Stopped (04/24/23 0533)    azithromycin  Stopped (04/23/23 1145)   dextrose  75 mL/hr at 04/23/23 2124     LOS: 2 days       Anthony CHRISTELLA Pouch, MD [facility] Pager [phone]  If 7PM-7AM, please contact night-coverage [email or web address] 04/24/2023, 8:56 AM

## 2023-04-24 NOTE — ED Notes (Addendum)
 This RN notified Jawo, NP that patient's BP cuff continues to read low after this RN repositioned cuff and rechecked BP. Jawo, NP ordered this RN to recheck patient's BP manually. This RN rechecked patient's BP and found the reading to be 118/86. This RN updated Darleene, NP. No orders at this time. This RN replaced automatic BP cuff with a smaller cuff. BP reading WDL at this time.

## 2023-04-24 NOTE — Progress Notes (Signed)
 PHARMACIST - PHYSICIAN COMMUNICATION DR:   TRH CONCERNING: Antibiotic IV to Oral Route Change Policy  RECOMMENDATION: This patient is receiving azithromycin  by the intravenous route.  Based on criteria approved by the Pharmacy and Therapeutics Committee, the antibiotic(s) is/are being converted to the equivalent oral dose form(s).   DESCRIPTION: These criteria include: Patient being treated for a respiratory tract infection, urinary tract infection, cellulitis or clostridium difficile associated diarrhea if on metronidazole The patient is not neutropenic and does not exhibit a GI malabsorption state The patient is eating (either orally or via tube) and/or has been taking other orally administered medications for a least 24 hours The patient is improving clinically and has a Tmax < 100.5  If you have questions about this conversion, please contact the Pharmacy Department  []   [phone] )  [facility] [x]   [phone] )  [facility] []   [phone] )  [facility] []   [phone] )  [place] []   [phone] )  [facility]     Celestine Slovak, PharmD, [place], [place] Work Cell: [phone] 04/24/2023 3:10 PM

## 2023-04-24 NOTE — ED Notes (Signed)
Patient adjusted in bed for comfort; pillow placed in between legs.

## 2023-04-24 NOTE — Procedures (Signed)
 Modified Barium Swallow Study  Patient Details  Name: [name] MRN: [id number] Date of Birth: [birth year]  Today's Date: 04/24/2023  Modified Barium Swallow completed.  Full report located under Chart Review in the Imaging Section.  History of Present Illness [name] is a 82 y.o. male with medical history significant of intellectual disability, hyperlipidemia, mental retardation, BPH, wheelchair dependent presenting with sepsis, acute respiratory failure with hypoxia, aspiration pneumonia, UTI, hypernatremia, Proteus bacteremia.  Limited history in the setting of electrical disability.  Patient noted to have been evaluated in the ER in December 27.  Was formally diagnosed with issues including UTI.  Was discharged back to group home on Keflex .  Unclear if patient has been receiving medication.  Patient with noted worsening lethargy at group home.  Noted temperature of 105 by EMS. CXR 04/22/23: New right perihilar opacity, which may be due to atelectasis or pneumonia. Pt with fever of 102.4 this date, WBC 9.3.   Clinical Impression Pt was awake throughout this study and while he had some vocalizations, meaning verbal communication was not observed. Pt was not able to follow directions or participate in self-feeding.     Pt presents with high risk of aspiration when consuming nectar thick liquids via spoon and cup and when consuming puree.       Pt presents with severe oral phase dysphagia that is likely cognition based and is c/b oral holding of all administered boluses, lingual lingual manipulation of boluses and rocking of all boluses d/t difficulty with AP propulsion. As a result, his oral phase is prolonged with ~ half of the bolus remaining in his mouth following each swallow. Pt is at increased risk of aspirating oral residue and he is likely to required extensive time to consume meals as maximal multimodal cues were not helpful in pt swallowing.     Pt also presents with  moderate to severe pharyngeal phase dysphagia. Pt with initial silent aspiration of nectar thick liquid via spoon. The bolus spilled into his airway without any swallow initiation. However, when provided with cup sips, pt with improved pharyngeal response and no further incidences of aspiration were observed. Pt requires multiple swallows d/t oral phase deficits and boluses remaining in his oral cavity.       At this time, would recommend least restrictive diet of dysphagia 1 with nectar thick liquids via cup. Despite this modified diet, pt remains at a VERY HIGH RISK OF ASPIRATION d/t feeding dependency, current medical decline, baseline intellectual deficits and moderate oropharyngeal dysphagia. At this time, further ST services are not likely to impact pt's abilities. Given severity of swallow impairment, would recommend discussion with Palliative Care for establishment of GOC.   Factors that may increase risk of adverse event in presence of aspiration Noe & Lianne 2021): Poor general health and/or compromised immunity;Respiratory or GI disease;Reduced cognitive function;Limited mobility;Frail or deconditioned;Dependence for feeding and/or oral hygiene;Inadequate oral hygiene;Reduced saliva  Swallow Evaluation Recommendations Recommendations: PO diet PO Diet Recommendation: Dysphagia 1 (Pureed);Mildly thick liquids (Level 2, nectar thick) Liquid Administration via: Cup Medication Administration: Crushed with puree Supervision: Full assist for feeding;Full supervision/cueing for swallowing strategies Swallowing strategies  : Minimize environmental distractions;Slow rate;Small bites/sips Postural changes: Position pt fully upright for meals Oral care recommendations: Oral care before PO Caregiver Recommendations: Avoid jello, ice cream, thin soups, popsicles;Remove water  pitcher;Have oral suction available    Aneta Hendershott B. Rubbie, M.S., CCC-SLP, CBIS Speech-Language Pathologist Certified  Brain Injury Specialist   [facility]  Services Office [phone] Ascom [phone] Fax [phone]

## 2023-04-24 NOTE — ED Notes (Signed)
Updated sister Dennie Bible via phone, all questions answered.

## 2023-04-25 DIAGNOSIS — R1310 Dysphagia, unspecified: Secondary | ICD-10-CM

## 2023-04-25 DIAGNOSIS — F79 Unspecified intellectual disabilities: Secondary | ICD-10-CM

## 2023-04-25 DIAGNOSIS — D539 Nutritional anemia, unspecified: Secondary | ICD-10-CM

## 2023-04-25 DIAGNOSIS — D696 Thrombocytopenia, unspecified: Secondary | ICD-10-CM

## 2023-04-25 DIAGNOSIS — R6520 Severe sepsis without septic shock: Secondary | ICD-10-CM

## 2023-04-25 DIAGNOSIS — E785 Hyperlipidemia, unspecified: Secondary | ICD-10-CM

## 2023-04-25 DIAGNOSIS — E876 Hypokalemia: Secondary | ICD-10-CM

## 2023-04-25 DIAGNOSIS — R1319 Other dysphagia: Secondary | ICD-10-CM

## 2023-04-25 DIAGNOSIS — N3001 Acute cystitis with hematuria: Secondary | ICD-10-CM

## 2023-04-25 DIAGNOSIS — E870 Hyperosmolality and hypernatremia: Secondary | ICD-10-CM | POA: Diagnosis not present

## 2023-04-25 DIAGNOSIS — J9601 Acute respiratory failure with hypoxia: Secondary | ICD-10-CM | POA: Diagnosis not present

## 2023-04-25 DIAGNOSIS — A419 Sepsis, unspecified organism: Secondary | ICD-10-CM | POA: Diagnosis not present

## 2023-04-25 LAB — COMPREHENSIVE METABOLIC PANEL
ALT: 14 U/L (ref 0–44)
AST: 21 U/L (ref 15–41)
Albumin: 2.2 g/dL — ABNORMAL LOW (ref 3.5–5.0)
Alkaline Phosphatase: 58 U/L (ref 38–126)
Anion gap: 7 (ref 5–15)
BUN: 27 mg/dL — ABNORMAL HIGH (ref 8–23)
CO2: 23 mmol/L (ref 22–32)
Calcium: 8 mg/dL — ABNORMAL LOW (ref 8.9–10.3)
Chloride: 123 mmol/L — ABNORMAL HIGH (ref 98–111)
Creatinine, Ser: 0.68 mg/dL (ref 0.61–1.24)
GFR, Estimated: >60 mL/min (ref >60)
Glucose, Bld: 83 mg/dL (ref 70–99)
Potassium: 3.2 mmol/L — ABNORMAL LOW (ref 3.5–5.1)
Sodium: 153 mmol/L — ABNORMAL HIGH (ref 135–145)
Total Bilirubin: 0.7 mg/dL (ref 0.0–1.2)
Total Protein: 5.5 g/dL — ABNORMAL LOW (ref 6.5–8.1)

## 2023-04-25 LAB — CBC
HCT: 32.8 % — ABNORMAL LOW (ref 39.0–52.0)
Hemoglobin: 10.8 g/dL — ABNORMAL LOW (ref 13.0–17.0)
MCH: 34.2 pg — ABNORMAL HIGH (ref 26.0–34.0)
MCHC: 32.9 g/dL (ref 30.0–36.0)
MCV: 103.8 fL — ABNORMAL HIGH (ref 80.0–100.0)
Platelets: 102 10*3/uL — ABNORMAL LOW (ref 150–400)
RBC: 3.16 MIL/uL — ABNORMAL LOW (ref 4.22–5.81)
RDW: 14.4 % (ref 11.5–15.5)
WBC: 5.8 10*3/uL (ref 4.0–10.5)
nRBC: 0 % (ref 0.0–0.2)

## 2023-04-25 LAB — LEGIONELLA PNEUMOPHILA SEROGP 1 UR AG: L. pneumophila Serogp 1 Ur Ag: NEGATIVE

## 2023-04-25 LAB — CULTURE, BLOOD (ROUTINE X 2)
Culture: NO GROWTH
Special Requests: ADEQUATE

## 2023-04-25 MED ORDER — ALFUZOSIN HCL ER 10 MG PO TB24
10.0000 mg | ORAL_TABLET | Freq: Every day | ORAL | Status: DC
  Administered 2023-04-25 – 2023-05-01 (×7): 10 mg via ORAL
  Filled 2023-04-25 (×7): qty 1

## 2023-04-25 MED ORDER — LUBIPROSTONE 24 MCG PO CAPS
24.0000 ug | ORAL_CAPSULE | Freq: Two times a day (BID) | ORAL | Status: DC
  Administered 2023-04-25 (×2): 24 ug via ORAL
  Filled 2023-04-25 (×3): qty 1

## 2023-04-25 MED ORDER — ALPRAZOLAM 0.25 MG PO TABS: 0.2500 mg | ORAL_TABLET | Freq: Every day | ORAL | Status: DC | PRN

## 2023-04-25 MED ORDER — POTASSIUM CHLORIDE 20 MEQ PO PACK
40.0000 meq | PACK | Freq: Once | ORAL | Status: AC
  Administered 2023-04-25: 40 meq via ORAL
  Filled 2023-04-25: qty 2

## 2023-04-25 MED ORDER — POTASSIUM CHLORIDE 10 MEQ/100ML IV SOLN
10.0000 meq | INTRAVENOUS | Status: AC
  Administered 2023-04-25 (×2): 10 meq via INTRAVENOUS
  Filled 2023-04-25: qty 100

## 2023-04-25 NOTE — TOC CM/SW Note (Signed)
 Transition of Care [facility]) - Inpatient Brief Assessment   Patient Details  Name: [name] MRN: [id number] Date of Birth: [birth year]  Transition of Care [facility]) CM/SW Contact:    Ladene Lady, LCSW Phone Number: 04/25/2023, 10:23 AM   Clinical Narrative:  [facility]. No needs currently TOC waiting on PT eval.     Transition of Care Asessment: Insurance and Status: Insurance coverage has been reviewed Patient has primary care physician: Yes Home environment has been reviewed: [facility] Prior level of function:: waiting on PT eval Prior/Current Home Services: No current home services Social Drivers of Health Review: SDOH reviewed no interventions necessary Readmission risk has been reviewed: Yes Transition of care needs: no transition of care needs at this time

## 2023-04-25 NOTE — Progress Notes (Signed)
 Given to RN to give to pt as MD was in with pt

## 2023-04-25 NOTE — Assessment & Plan Note (Addendum)
 Continue to monitor, last hemoglobin 10.8

## 2023-04-25 NOTE — Hospital Course (Addendum)
 83 year old man past medical history of intellectual disability, hyperlipidemia, BPH, wheelchair dependent.  Patient was seen in the emergency room on December 27 was diagnosed with UTI.  Patient worsened on the group home with lethargy patient presented with acute respiratory failure and sepsis secondary to Proteus and aspiration pneumonia.   1/1.  Patient answers a few questions but cannot elaborate much.  Patient being treated for sepsis with IV antibiotics. 1/2.  Patient answers a few more questions today.  Needs to be fed.  Patient on dysphagia diet.  Still with hypernatremia. 1/3.  Patient asked me for water .  I fed him some cranberry juice thickened.  Potassium 2.9.  Sodium 149. 1/4.  Sodium 156.  Started on D5W. 1/5.  Sodium 150 1/6.  Sodium 149, hemoglobin 10.8.  Switched antibiotic over to amoxicillin  to complete total of 10 days.  TOC awaiting to hear back from group home on whether they will take him back. 1/7.  Group home accepted back, will refer to wound care center. Palliative care as outpatient.  Okay to use Ensure with thickener.

## 2023-04-25 NOTE — Assessment & Plan Note (Addendum)
 Likely secondary to sepsis, last platelet count 158.

## 2023-04-25 NOTE — Plan of Care (Signed)
  Problem: Fluid Volume: Goal: Hemodynamic stability will improve Outcome: Progressing   Problem: Clinical Measurements: Goal: Diagnostic test results will improve Outcome: Progressing Goal: Signs and symptoms of infection will decrease Outcome: Progressing   Problem: Respiratory: Goal: Ability to maintain adequate ventilation will improve Outcome: Progressing   Problem: Education: Goal: Knowledge of General Education information will improve Description: Including pain rating scale, medication(s)/side effects and non-pharmacologic comfort measures Outcome: Progressing   Problem: Health Behavior/Discharge Planning: Goal: Ability to manage health-related needs will improve Outcome: Progressing   Problem: Clinical Measurements: Goal: Ability to maintain clinical measurements within normal limits will improve Outcome: Progressing Goal: Will remain free from infection Outcome: Progressing Goal: Diagnostic test results will improve Outcome: Progressing Goal: Respiratory complications will improve Outcome: Progressing Goal: Cardiovascular complication will be avoided Outcome: Progressing

## 2023-04-25 NOTE — Progress Notes (Signed)
 OT Screen Note  Patient Details Name: [name] MRN: [id number] DOB: July 17, 1940   Cancelled Treatment:    Reason Eval/Treat Not Completed: OT screened, no needs identified, will sign off. Pt dependent at baseline, no skilled OT needs.   Demetria Lightsey R., MPH, MS, OTR/L ascom [phone] 04/25/23, 11:11 AM

## 2023-04-25 NOTE — Progress Notes (Signed)
 PT Cancellation Note  Patient Details Name: [name] MRN: [id number] DOB: [birth year]   Cancelled Treatment:    Reason Eval/Treat Not Completed: PT screened, no needs identified, will sign off Patient dependent and bedbound at baseline. No skilled PT needs identified.   Maryanne Finder, PT, DPT Physical Therapist - [facility]  [facility]    Rashaad Hallstrom A Hady Niemczyk 04/25/2023, 12:13 PM

## 2023-04-25 NOTE — Assessment & Plan Note (Addendum)
 Replaced

## 2023-04-25 NOTE — Assessment & Plan Note (Addendum)
 Continue dysphagia diet 1 diet with nectar thick liquids

## 2023-04-25 NOTE — Care Management Important Message (Signed)
 Important Message  Patient Details  Name: [name] MRN: [id number] Date of Birth: [birth year]   Important Message Given:  Yes - Medicare IM     Cristela Blue, CMA 04/25/2023, 10:31 AM

## 2023-04-25 NOTE — Progress Notes (Signed)
 Progress Note   Patient: [name] FMW:[id number] DOB: [birth year] DOA: 04/22/2023     3 DOS: the patient was seen and examined on 04/25/2023   Brief hospital course: 83 year old man past medical history of intellectual disability, hyperlipidemia, BPH, wheelchair dependent.  Patient was seen in the emergency room on December 27 was diagnosed with UTI.  Patient worsened on the group home with lethargy patient presented with acute respiratory failure and sepsis secondary to Proteus and aspiration pneumonia.   1/1.  Patient answers a few questions but cannot elaborate much.  Patient being treated for sepsis with IV antibiotics.  Assessment and Plan: * Severe sepsis (HCC) Present on admission meeting sepsis criteria with Tmax of 102, heart rate 100s, respirations into the 30s.  Proteus growing out of blood cultures on 12/27.  Repeat blood cultures negative.  Aspiration pneumonia.  Patient with acute respiratory failure.  Patient on Unasyn  and Zithromax .  Acute respiratory failure with hypoxia (HCC) On presentation patient had a pulse ox of 91% on 3 L and quickly needed to go up to heated high flow nasal cannula 60% FiO2.  Patient's oxygenation has improved down to 2 L.  Hypernatremia Unlikely to keep up with nutritional needs.  Patient on dysphagia diet.  Will check a sodium tomorrow.  Thrombocytopenia (HCC) Likely secondary to sepsis  Hypokalemia Replace potassium IV and orally  UTI (urinary tract infection) Recent diagnosis December 27 of UTI-placed on course of Keflex  outpatient.  On Unasyn  here.  Intellectual disability Baseline intellectual disability Group home resident Minimally verbal at baseline  Dysphagia Continue dysphagia diet  Dyslipidemia Lipitor  Macrocytic anemia Continue to monitor       Subjective: Patient tries to answer few questions but difficult to understand.  Patient is mostly wheelchair-bound at the group home.  Patient is on dysphagia diet.   Patient admitted with sepsis.  Physical Exam: Vitals:   04/24/23 1348 04/24/23 1929 04/25/23 0342 04/25/23 0800  BP: 117/81 103/65 (!) 122/92 118/75  Pulse: 67 (!) 57 (!) 57 63  Resp: 14 17 18 20   Temp: (!) 97.5 F (36.4 C) (!) 97.5 F (36.4 C) 97.8 F (36.6 C) 97.8 F (36.6 C)  TempSrc: Oral   Oral  SpO2: 95% 99% 98% 98%  Height:       Physical Exam HENT:     Head: Normocephalic.     Mouth/Throat:     Comments: Some phlegm at the back of the mouth. Eyes:     General: Lids are normal.     Conjunctiva/sclera: Conjunctivae normal.  Cardiovascular:     Rate and Rhythm: Normal rate and regular rhythm.     Heart sounds: Normal heart sounds, S1 normal and S2 normal.  Pulmonary:     Breath sounds: No decreased breath sounds, wheezing, rhonchi or rales.  Abdominal:     Palpations: Abdomen is soft.     Tenderness: There is no abdominal tenderness.  Musculoskeletal:     Right lower leg: Swelling present.     Left lower leg: Swelling present.     Comments: Both feet extended downward.  Skin:    General: Skin is warm.     Findings: No rash.  Neurological:     Mental Status: He is lethargic.     Data Reviewed: Proteus and blood culture on 12/27.  Repeat blood cultures 12/29 negative for 3 days Sodium 153, potassium 3.2, creatinine 0.68, hemoglobin 10.8, white blood count 5.8, platelet count 102  Family Communication: Spoke with POA on  the phone  Disposition: Status is: Inpatient Remains inpatient appropriate because: Continue IV antibiotics, treating sepsis.  Planned Discharge Destination: Likely group home    Time spent: 28 minutes  Author: Charlie Patterson, MD 04/25/2023 3:37 PM  For on call review [email or web address].

## 2023-04-26 DIAGNOSIS — E870 Hyperosmolality and hypernatremia: Secondary | ICD-10-CM | POA: Diagnosis not present

## 2023-04-26 DIAGNOSIS — J9601 Acute respiratory failure with hypoxia: Secondary | ICD-10-CM | POA: Diagnosis not present

## 2023-04-26 DIAGNOSIS — A419 Sepsis, unspecified organism: Secondary | ICD-10-CM | POA: Diagnosis not present

## 2023-04-26 DIAGNOSIS — L89152 Pressure ulcer of sacral region, stage 2: Secondary | ICD-10-CM

## 2023-04-26 DIAGNOSIS — R197 Diarrhea, unspecified: Secondary | ICD-10-CM | POA: Insufficient documentation

## 2023-04-26 DIAGNOSIS — D696 Thrombocytopenia, unspecified: Secondary | ICD-10-CM | POA: Diagnosis not present

## 2023-04-26 DIAGNOSIS — N3000 Acute cystitis without hematuria: Secondary | ICD-10-CM

## 2023-04-26 LAB — BASIC METABOLIC PANEL
Anion gap: 11 (ref 5–15)
BUN: 20 mg/dL (ref 8–23)
CO2: 23 mmol/L (ref 22–32)
Calcium: 7.9 mg/dL — ABNORMAL LOW (ref 8.9–10.3)
Chloride: 119 mmol/L — ABNORMAL HIGH (ref 98–111)
Creatinine, Ser: 0.63 mg/dL (ref 0.61–1.24)
GFR, Estimated: >60 mL/min (ref >60)
Glucose, Bld: 74 mg/dL (ref 70–99)
Potassium: 3 mmol/L — ABNORMAL LOW (ref 3.5–5.1)
Sodium: 153 mmol/L — ABNORMAL HIGH (ref 135–145)

## 2023-04-26 LAB — HEMOGLOBIN: Hemoglobin: 11.2 g/dL — ABNORMAL LOW (ref 13.0–17.0)

## 2023-04-26 MED ORDER — POTASSIUM CHLORIDE 10 MEQ/100ML IV SOLN
10.0000 meq | INTRAVENOUS | Status: AC
  Administered 2023-04-26 (×2): 10 meq via INTRAVENOUS
  Filled 2023-04-26: qty 100

## 2023-04-26 MED ORDER — DEXTROSE 5 % IV BOLUS
500.0000 mL | Freq: Once | INTRAVENOUS | Status: AC
  Administered 2023-04-26: 500 mL via INTRAVENOUS

## 2023-04-26 MED ORDER — PANTOPRAZOLE SODIUM 40 MG PO TBEC
40.0000 mg | DELAYED_RELEASE_TABLET | Freq: Every day | ORAL | Status: DC
Start: 2023-04-26 — End: 2023-05-01
  Administered 2023-04-26 – 2023-05-01 (×6): 40 mg via ORAL
  Filled 2023-04-26 (×6): qty 1

## 2023-04-26 MED ORDER — POTASSIUM CHLORIDE 10 MEQ/100ML IV SOLN
10.0000 meq | INTRAVENOUS | Status: AC
  Filled 2023-04-26: qty 100

## 2023-04-26 NOTE — Consult Note (Signed)
 WOC Nurse Consult Note: Reason for Consult: wounds on buttocks Patient from group home, WC bound Wound type: Stage 2 Pressure Injury; right buttock; 100% pink, clean Deep Tissue Pressure Injury; left buttock; in evolution with skin flap present. Dark purple at left lateral aspect of wound bed. Pink wound under skin flap   Pressure Injury POA: Yes Measurement: see nursing flow sheets Wound bed: see above  Drainage (amount, consistency, odor) serosanguinous  Periwound: intact, left buttock wound periwound macerated  Dressing procedure/placement/frequency: Cleanse left and right buttock wounds with saline, pat dry. Cover with single layer of xeroform gauze, top with foam. Change every other day and PRN soilage. Low air loss mattress; turn and reposition at least every two hours.  Chair pressure redistribution pad to be used if up in chair and sent out with patient for use at group home.    Re consult if needed, will not follow at this time. Thanks  Dodie Parisi [employer], RN,CWOCN, CNS, CWON-AP [phone])

## 2023-04-26 NOTE — Progress Notes (Addendum)
 Progress Note   Patient: [name] FMW:[id number] DOB: 08-25-1940 DOA: 04/22/2023     4 DOS: the patient was seen and examined on 04/26/2023   Brief hospital course: 83 year old man past medical history of intellectual disability, hyperlipidemia, BPH, wheelchair dependent.  Patient was seen in the emergency room on December 27 was diagnosed with UTI.  Patient worsened on the group home with lethargy patient presented with acute respiratory failure and sepsis secondary to Proteus and aspiration pneumonia.   1/1.  Patient answers a few questions but cannot elaborate much.  Patient being treated for sepsis with IV antibiotics. 1/2.  Patient answers a few more questions today.  Needs to be fed.  Patient on dysphagia diet.  Still with hypernatremia.  Assessment and Plan: * Severe sepsis (HCC) Present on admission meeting sepsis criteria with Tmax of 102, heart rate 100s, respirations into the 30s.  Proteus growing out of blood cultures on 12/27.  Repeat blood cultures negative.  Aspiration pneumonia.  Patient with acute respiratory failure.  Patient on Unasyn  and completed Zithromax .  Acute respiratory failure with hypoxia (HCC) On presentation patient had a pulse ox of 91% on 3 L and quickly needed to go up to heated high flow nasal cannula 60% FiO2.  Patient's oxygenation has improved down to 2 L.  Hypernatremia Unlikely to keep up with nutritional needs.  Patient on dysphagia diet and needs to be fed.  Sodium 153 today.  Will give D5 bolus.  Will get palliative care consultation.  Thrombocytopenia (HCC) Likely secondary to sepsis  Hypokalemia Replace potassium IV   UTI (urinary tract infection) Recent diagnosis December 27 of UTI-placed on course of Keflex  outpatient.  On Unasyn  here.  Intellectual disability Baseline intellectual disability Group home resident Minimally verbal at baseline  Dysphagia Continue dysphagia diet  Dyslipidemia Lipitor  Macrocytic  anemia Continue to monitor  Diarrhea Discontinue Amitiza   Sacral decubitus ulcer, stage II (HCC) viewed by me and documented today.  Stage II.  Appreciate wound care consultation.       Subjective: Patient answers a few more questions.  Needs to be fed.  Wheelchair-bound.  Admitted with pneumonia.  Physical Exam: Vitals:   04/25/23 1623 04/25/23 2036 04/26/23 0531 04/26/23 0736  BP: 130/74 130/84 (!) 140/87 (!) 140/83  Pulse: (!) 58 61 (!) 58 (!) 59  Resp: 20 20 20 14   Temp: 97.7 F (36.5 C) 98.6 F (37 C) (!) 97.5 F (36.4 C) 97.7 F (36.5 C)  TempSrc:    Axillary  SpO2: 98% 100% 98% 98%  Height:       Physical Exam HENT:     Head: Normocephalic.  Eyes:     General: Lids are normal.     Conjunctiva/sclera: Conjunctivae normal.  Cardiovascular:     Rate and Rhythm: Normal rate and regular rhythm.     Heart sounds: Normal heart sounds, S1 normal and S2 normal.  Pulmonary:     Breath sounds: No decreased breath sounds, wheezing, rhonchi or rales.  Abdominal:     Palpations: Abdomen is soft.     Tenderness: There is no abdominal tenderness.  Musculoskeletal:     Right lower leg: Swelling present.     Left lower leg: Swelling present.     Comments: Both feet extended downward.  Skin:    General: Skin is warm.     Findings: No rash.     Comments: Stage II sacral area decubitus  Neurological:     Mental Status: He is lethargic.  Data Reviewed: Sodium 153, potassium 3.0, creatinine 0.63, hemoglobin 11.2  Family Communication: Left message for guardian.  Disposition: Status is: Inpatient Remains inpatient appropriate because: Will get palliative care consultation.  D5 bolus for hypernatremia.  Continue IV antibiotics for Proteus sepsis.  Planned Discharge Destination: Potentially back to group home    Time spent: 28 minutes  Author: Charlie Patterson, MD 04/26/2023 2:53 PM  For on call review [email or web address].

## 2023-04-26 NOTE — TOC Transition Note (Signed)
 Transition of Care [facility]) - Discharge Note   Patient Details  Name: [name] MRN: [id number] Date of Birth: Feb 22, 1941  [facility]) CM/SW Contact:  Mikeisha Lemonds A Carisa Backhaus, RN Phone Number: 04/26/2023, 10:28 AM   Clinical Narrative:    Chart reviewed.  I have left a message with the [facility].  TOC will continue to follow for discharge planning.  PT/OT has signed off due to patient being dependent at baseline.    TOC will continue to follow for discharge planning.           Patient Goals and CMS Choice            Discharge Placement                       Discharge Plan and Services Additional resources added to the After Visit Summary for                                       Social Drivers of Health (SDOH) Interventions SDOH Screenings   Food Insecurity: No Food Insecurity (04/24/2023)  Housing: Low Risk  (04/24/2023)  Transportation Needs: No Transportation Needs (04/24/2023)  Utilities: Not At Risk (04/24/2023)  Alcohol  Screen: Low Risk  (03/15/2023)  Depression (PHQ2-9): Low Risk  (03/15/2023)  Financial Resource Strain: Low Risk  (09/29/2022)  Physical Activity: Inactive (09/29/2022)  Social Connections: Patient Unable To Answer (04/24/2023)  Stress: No Stress Concern Present (09/29/2022)  Tobacco Use: Low Risk  (04/22/2023)     Readmission Risk Interventions     No data to display

## 2023-04-26 NOTE — Plan of Care (Signed)
  Problem: Fluid Volume: Goal: Hemodynamic stability will improve Outcome: Progressing   Problem: Clinical Measurements: Goal: Diagnostic test results will improve Outcome: Progressing Goal: Signs and symptoms of infection will decrease Outcome: Progressing   Problem: Respiratory: Goal: Ability to maintain adequate ventilation will improve Outcome: Progressing   Problem: Education: Goal: Knowledge of General Education information will improve Description: Including pain rating scale, medication(s)/side effects and non-pharmacologic comfort measures Outcome: Progressing   Problem: Health Behavior/Discharge Planning: Goal: Ability to manage health-related needs will improve Outcome: Progressing   Problem: Clinical Measurements: Goal: Ability to maintain clinical measurements within normal limits will improve Outcome: Progressing Goal: Will remain free from infection Outcome: Progressing Goal: Diagnostic test results will improve Outcome: Progressing Goal: Respiratory complications will improve Outcome: Progressing Goal: Cardiovascular complication will be avoided Outcome: Progressing   Problem: Activity: Goal: Risk for activity intolerance will decrease Outcome: Progressing   Problem: Nutrition: Goal: Adequate nutrition will be maintained Outcome: Progressing   Problem: Coping: Goal: Level of anxiety will decrease Outcome: Progressing   Problem: Elimination: Goal: Will not experience complications related to bowel motility Outcome: Progressing Goal: Will not experience complications related to urinary retention Outcome: Progressing   Problem: Pain Management: Goal: General experience of comfort will improve Outcome: Progressing   Problem: Safety: Goal: Ability to remain free from injury will improve Outcome: Progressing   Problem: Skin Integrity: Goal: Risk for impaired skin integrity will decrease Outcome: Progressing   Problem: Activity: Goal: Ability  to tolerate increased activity will improve Outcome: Progressing   Problem: Clinical Measurements: Goal: Ability to maintain a body temperature in the normal range will improve Outcome: Progressing   Problem: Respiratory: Goal: Ability to maintain adequate ventilation will improve Outcome: Progressing Goal: Ability to maintain a clear airway will improve Outcome: Progressing

## 2023-04-26 NOTE — Assessment & Plan Note (Addendum)
 Documented on 1/2.  Stage II.  Appreciate wound care consultation.  Cleanse wound buttock with saline and pat dry, cover with single layer of xeroform gauze and top with foam.  Change every other day.

## 2023-04-26 NOTE — TOC Progression Note (Signed)
 Transition of Care [facility]) - Progression Note    Patient Details  Name: BHAVYA GRAND MRN: [id number] Date of Birth: May 08, 1940  Transition [facility] Care [facility]) CM/SW Contact  Candie Gintz A Orman Matsumura, RN Phone Number: 04/26/2023, 3:11 PM  Clinical Narrative:    Chart reviewed.   I  have spoken with Jerelene, Admission Coordinator at the [facility] home.  She informs me that she would like for me to speak with their nurse Arland Breen in regards to patient's to patient returning for on discharge.  She informs me that Donna's number is [phone].  Belinda. LVM for  Arland Breen, nurse at Group Home to discuss disposition.  Will continue to follow for discharge planning.          Expected Discharge Plan and Services                                               Social Determinants of Health (SDOH) Interventions SDOH Screenings   Food Insecurity: No Food Insecurity (04/24/2023)  Housing: Low Risk  (04/24/2023)  Transportation Needs: No Transportation Needs (04/24/2023)  Utilities: Not At Risk (04/24/2023)  Alcohol  Screen: Low Risk  (03/15/2023)  Depression (PHQ2-9): Low Risk  (03/15/2023)  Financial Resource Strain: Low Risk  (09/29/2022)  Physical Activity: Inactive (09/29/2022)  Social Connections: Patient Unable To Answer (04/24/2023)  Stress: No Stress Concern Present (09/29/2022)  Tobacco Use: Low Risk  (04/22/2023)    Readmission Risk Interventions     No data to display

## 2023-04-26 NOTE — Progress Notes (Signed)
 Speech Language Pathology Treatment: Dysphagia  Patient Details Name: [name] MRN: [id number] DOB: 09/21/1940 Today's Date: 04/26/2023 Time: 8894-8879 SLP Time Calculation (min) (ACUTE ONLY): 15 min  Assessment / Plan / Recommendation Clinical Impression  Pt seen for follow up dysphagia intervention following MBSS completed on 12/31. Pt seen with trial of recommended consistencies (Nectar thick and Dys 1- puree). Continued minimal oral manipulation observed, with prolonged time and alternating liquids and solids utilized to facilitate oral clearance; therefore, no advanced solids trialed. No overt or subtle s/sx pharyngeal dysphagia noted. No change to vocal quality across trials. Based on recent MBSS and continued minimal oral manipulation, continue to recommend Dys 1 (puree) and Nectar thick liquids. Pt is still at HIGH risk for aspiration, recommend strict aspiration precautions (slow rate, small bites, elevated HOB, and alert for PO intake). SLP will continue to follow.    HPI HPI: [name] is a 83 y.o. male with medical history significant of intellectual disability, hyperlipidemia, mental retardation, BPH, wheelchair dependent presenting with sepsis, acute respiratory failure with hypoxia, aspiration pneumonia, UTI, hypernatremia, Proteus bacteremia.  Limited history in the setting of electrical disability.  Patient noted to have been evaluated in the ER in December 27.  Was formally diagnosed with issues including UTI.  Was discharged back to group home on Keflex .  Unclear if patient has been receiving medication.  Patient with noted worsening lethargy at group home.  Noted temperature of 105 by EMS. CXR 04/22/23: New right perihilar opacity, which may be due to atelectasis or pneumonia. Pt with fever of 98.6      SLP Plan  Continue with current plan of care      Recommendations for follow up therapy are one component of a multi-disciplinary discharge planning process, led  by the attending physician.  Recommendations may be updated based on patient status, additional functional criteria and insurance authorization.    Recommendations  Diet recommendations: Dysphagia 1 (puree);Nectar-thick liquid Liquids provided via: Cup Medication Administration: Crushed with puree Supervision: Full supervision/cueing for compensatory strategies;Staff to assist with self feeding Compensations: Small sips/bites;Slow rate;Minimize environmental distractions;Follow solids with liquid Postural Changes and/or Swallow Maneuvers: Seated upright 90 degrees                  Oral care BID;Oral care before and after PO   Frequent or constant Supervision/Assistance Dysphagia, oropharyngeal phase (R13.12)     Continue with current plan of care   Kevin Shank Clapp  MS [facility] SLP   [name]  04/26/2023, 1:43 PM

## 2023-04-26 NOTE — Assessment & Plan Note (Addendum)
 Discontinued Amitiza.  Patient stool were blackish green.  Hemoglobin stable, this is not a GI bleed.

## 2023-04-27 DIAGNOSIS — T17908A Unspecified foreign body in respiratory tract, part unspecified causing other injury, initial encounter: Secondary | ICD-10-CM

## 2023-04-27 DIAGNOSIS — E870 Hyperosmolality and hypernatremia: Secondary | ICD-10-CM | POA: Diagnosis not present

## 2023-04-27 DIAGNOSIS — N3000 Acute cystitis without hematuria: Secondary | ICD-10-CM | POA: Diagnosis not present

## 2023-04-27 DIAGNOSIS — J9601 Acute respiratory failure with hypoxia: Secondary | ICD-10-CM | POA: Diagnosis not present

## 2023-04-27 DIAGNOSIS — D696 Thrombocytopenia, unspecified: Secondary | ICD-10-CM | POA: Diagnosis not present

## 2023-04-27 DIAGNOSIS — A419 Sepsis, unspecified organism: Secondary | ICD-10-CM | POA: Diagnosis not present

## 2023-04-27 DIAGNOSIS — R197 Diarrhea, unspecified: Secondary | ICD-10-CM

## 2023-04-27 DIAGNOSIS — Z515 Encounter for palliative care: Secondary | ICD-10-CM | POA: Diagnosis not present

## 2023-04-27 LAB — CBC
HCT: 29.7 % — ABNORMAL LOW (ref 39.0–52.0)
Hemoglobin: 10.2 g/dL — ABNORMAL LOW (ref 13.0–17.0)
MCH: 33.6 pg (ref 26.0–34.0)
MCHC: 34.3 g/dL (ref 30.0–36.0)
MCV: 97.7 fL (ref 80.0–100.0)
Platelets: 113 10*3/uL — ABNORMAL LOW (ref 150–400)
RBC: 3.04 MIL/uL — ABNORMAL LOW (ref 4.22–5.81)
RDW: 14.2 % (ref 11.5–15.5)
WBC: 4 10*3/uL (ref 4.0–10.5)
nRBC: 0 % (ref 0.0–0.2)

## 2023-04-27 LAB — BASIC METABOLIC PANEL
Anion gap: 8 (ref 5–15)
BUN: 19 mg/dL (ref 8–23)
CO2: 24 mmol/L (ref 22–32)
Calcium: 7.8 mg/dL — ABNORMAL LOW (ref 8.9–10.3)
Chloride: 117 mmol/L — ABNORMAL HIGH (ref 98–111)
Creatinine, Ser: 0.62 mg/dL (ref 0.61–1.24)
GFR, Estimated: >60 mL/min (ref >60)
Glucose, Bld: 99 mg/dL (ref 70–99)
Potassium: 2.9 mmol/L — ABNORMAL LOW (ref 3.5–5.1)
Sodium: 149 mmol/L — ABNORMAL HIGH (ref 135–145)

## 2023-04-27 LAB — CULTURE, BLOOD (ROUTINE X 2)
Culture: NO GROWTH
Culture: NO GROWTH
Special Requests: ADEQUATE
Special Requests: ADEQUATE

## 2023-04-27 MED ORDER — FLUCONAZOLE 100 MG PO TABS
100.0000 mg | ORAL_TABLET | Freq: Every day | ORAL | Status: DC
Start: 2023-04-28 — End: 2023-05-01
  Administered 2023-04-28 – 2023-05-01 (×4): 100 mg via ORAL
  Filled 2023-04-27 (×4): qty 1

## 2023-04-27 MED ORDER — POTASSIUM CHLORIDE 20 MEQ PO PACK
20.0000 meq | PACK | Freq: Two times a day (BID) | ORAL | Status: DC
Start: 2023-04-27 — End: 2023-04-29
  Administered 2023-04-27 – 2023-04-28 (×3): 20 meq via ORAL
  Filled 2023-04-27 (×3): qty 1

## 2023-04-27 MED ORDER — POTASSIUM CHLORIDE 10 MEQ/100ML IV SOLN
10.0000 meq | INTRAVENOUS | Status: AC
  Administered 2023-04-27 (×2): 10 meq via INTRAVENOUS
  Filled 2023-04-27: qty 100

## 2023-04-27 MED ORDER — FLUCONAZOLE 100 MG PO TABS
200.0000 mg | ORAL_TABLET | Freq: Once | ORAL | Status: AC
  Administered 2023-04-27: 200 mg via ORAL
  Filled 2023-04-27: qty 2

## 2023-04-27 NOTE — Progress Notes (Signed)
**Note Kevin-Identified via Obfuscation**  Speech Language Pathology Treatment: Dysphagia  Patient Details Name: [name] MRN: [id number] DOB: 16-Feb-1941 Today's Date: 04/27/2023 Time: 1017-1030 SLP Time Calculation (min) (ACUTE ONLY): 13 min  Assessment / Plan / Recommendation Clinical Impression  Pt seen for ongoing safety when consuming currently prescribed diet. Per chart review, staff at [facility] voiced concern with pt pocketing food prior to admission. During this session, skilled observation was provided of pt consuming nectar thick liquids via cup with total assistance for feeding. Pt continues to require maximal to moderate cues to swallow boluses d/t oral holding. At this time, presume pt is currently at new baseline with dysphagia 1 diet and nectar thick liquids via cup, medicine crushed in puree his diet going forward. ST services will sign off at this time.    HPI HPI: [name] is a 83 y.o. male with medical history significant of intellectual disability, hyperlipidemia, mental retardation, BPH, wheelchair dependent presenting with sepsis, acute respiratory failure with hypoxia, aspiration pneumonia, UTI, hypernatremia, Proteus bacteremia.  Limited history in the setting of electrical disability.  Patient noted to have been evaluated in the ER in December 27.  Was formally diagnosed with issues including UTI.  Was discharged back to group home on Keflex .  Unclear if patient has been receiving medication.  Patient with noted worsening lethargy at group home.  Noted temperature of 105 by EMS. CXR 04/22/23: New right perihilar opacity, which may be due to atelectasis or pneumonia. Pt with fever of 98.6      SLP Plan  All goals met      Recommendations for follow up therapy are one component of a multi-disciplinary discharge planning process, led by the attending physician.  Recommendations may be updated based on patient status, additional functional criteria and insurance authorization.    Recommendations   Diet recommendations: Dysphagia 1 (puree);Nectar-thick liquid Liquids provided via: Cup Medication Administration: Crushed with puree Supervision: Full supervision/cueing for compensatory strategies;Staff to assist with self feeding Compensations: Small sips/bites;Slow rate;Minimize environmental distractions;Follow solids with liquid Postural Changes and/or Swallow Maneuvers: Seated upright 90 degrees;Upright 30-60 min after meal                  Oral care BID;Oral care before and after PO   Frequent or constant Supervision/Assistance Dysphagia, oropharyngeal phase (R13.12)     All goals met    Margeret Macias [name], M.S., CCC-SLP, Tree Surgeon Certified Brain Injury Specialist [facility]  [facility] Rehabilitation Services Office [phone] Ascom [phone] Fax [phone]

## 2023-04-27 NOTE — TOC Progression Note (Signed)
 Transition of Care [facility]) - Progression Note    Patient Details  Name: [name] MRN: [id number] Date of Birth: [birth year]  Transition of [facility] CM/SW Contact  Samba Cumba A Lakina Mcintire, RN Phone Number: 04/27/2023, 10:26 AM  Clinical Narrative:    Chart reviewed. I have spoken with Arland Ann, RN with [facility]. She informs me that prior to admission patient was wheel-chair bound.  Arland reports that at the facility they do patients wound care and they turn patient every 2 hours.   Arland reports that they are not allowed to have home health at the group home.  Arland reports that she has been concerned about patient pocketing his food.  Arland also reports that she has had patient for 20 years and patient was contracted when she received him into the facility.  She reports that patient has a very large stone that requires an open surgery and the family has been opposed to an open surgery to removed the stone for a few years. Arland will follow up with her leadership team as she is unsure that she will be able to manage his care on discharge.  She will have to come and evaluate patient prior to discharge.  I did inform Arland that patient has orders for a Hospice consult.  TOC will continue to follow progress of patient.        Expected Discharge Plan and Services                                               Social Determinants of Health (SDOH) Interventions SDOH Screenings   Food Insecurity: No Food Insecurity (04/24/2023)  Housing: Low Risk  (04/24/2023)  Transportation Needs: No Transportation Needs (04/24/2023)  Utilities: Not At Risk (04/24/2023)  Alcohol  Screen: Low Risk  (03/15/2023)  Depression (PHQ2-9): Low Risk  (03/15/2023)  Financial Resource Strain: Low Risk  (09/29/2022)  Physical Activity: Inactive (09/29/2022)  Social Connections: Patient Unable To Answer (04/24/2023)  Stress: No Stress Concern Present (09/29/2022)  Tobacco Use: Low Risk   (04/22/2023)    Readmission Risk Interventions     No data to display

## 2023-04-27 NOTE — Progress Notes (Signed)
 Progress Note   Patient: [name] FMW:[id number] DOB: 07/22/1940 DOA: 04/22/2023     5 DOS: the patient was seen and examined on 04/27/2023   Brief hospital course: 83 year old man past medical history of intellectual disability, hyperlipidemia, BPH, wheelchair dependent.  Patient was seen in the emergency room on December 27 was diagnosed with UTI.  Patient worsened on the group home with lethargy patient presented with acute respiratory failure and sepsis secondary to Proteus and aspiration pneumonia.   1/1.  Patient answers a few questions but cannot elaborate much.  Patient being treated for sepsis with IV antibiotics. 1/2.  Patient answers a few more questions today.  Needs to be fed.  Patient on dysphagia diet.  Still with hypernatremia.  Assessment and Plan: * Severe sepsis (HCC) Present on admission meeting sepsis criteria with Tmax of 102, heart rate 100s, respirations into the 30s.  Proteus growing out of blood cultures on 12/27.  Repeat blood cultures negative.  Aspiration pneumonia.  Patient with acute respiratory failure.  Patient on Unasyn  and completed Zithromax .  Acute respiratory failure with hypoxia (HCC) On presentation patient had a pulse ox of 91% on 3 L and quickly needed to go up to heated high flow nasal cannula 60% FiO2.  Patient's oxygenation has improved down to 1 L.  Hypernatremia Unlikely to keep up with nutritional needs.  Patient on dysphagia diet and needs to be fed.  Sodium 149 today.  Palliative care consultation.  Thrombocytopenia (HCC) Likely secondary to sepsis, last platelet count 113.  Hypokalemia Replace potassium orally and IV.  UTI (urinary tract infection) Recent diagnosis December 27 of UTI-placed on course of Keflex  outpatient.  On Unasyn  here.  Intellectual disability Baseline intellectual disability Group home resident  Dysphagia Continue dysphagia diet  Dyslipidemia Lipitor  Macrocytic anemia Continue to monitor, last  hemoglobin 10.2  Diarrhea Discontinue Amitiza   Sacral decubitus ulcer, stage II (HCC) Documented on 1/2.  Stage II.  Appreciate wound care consultation.       Subjective: Patient asked me for drink today.  I gave him a few sips of nectar thick cranberry juice.  Patient admitted with sepsis.  Physical Exam: Vitals:   04/26/23 1625 04/26/23 2020 04/27/23 0441 04/27/23 0732  BP: (!) 94/56 (!) 92/48 111/64 127/66  Pulse: 66 64 (!) 59 (!) 58  Resp: 18 19 17 16   Temp: 97.7 F (36.5 C) (!) 97.3 F (36.3 C) 98 F (36.7 C) 98.7 F (37.1 C)  TempSrc: Axillary   Axillary  SpO2: 96% 99% 96% 96%  Height:       Physical Exam HENT:     Head: Normocephalic.  Eyes:     General: Lids are normal.     Conjunctiva/sclera: Conjunctivae normal.  Cardiovascular:     Rate and Rhythm: Normal rate and regular rhythm.     Heart sounds: Normal heart sounds, S1 normal and S2 normal.  Pulmonary:     Breath sounds: No decreased breath sounds, wheezing, rhonchi or rales.  Abdominal:     Palpations: Abdomen is soft.     Tenderness: There is no abdominal tenderness.  Musculoskeletal:     Right lower leg: Swelling present.     Left lower leg: Swelling present.     Comments: Both feet extended downward.  Skin:    General: Skin is warm.     Findings: No rash.     Comments: Stage II sacral area decubitus  Neurological:     Mental Status: He is lethargic.  Data Reviewed: Sodium 149, potassium 2.9, creatinine 0.62, white blood count 4.0, hemoglobin 10.2, platelet count 113  Family Communication: Spoke with POA on the phone  Disposition: Status is: Inpatient Remains inpatient appropriate because: Patient was able to talk to me a little bit more.  Continue antibiotics through the weekend and maybe next week and get back to his facility if he improves further.  Planned Discharge Destination: Group home    Time spent: 28 minutes  Author: Charlie Patterson, MD 04/27/2023 4:30 PM  For on  call review [email or web address].

## 2023-04-27 NOTE — Plan of Care (Signed)
  Problem: Fluid Volume: Goal: Hemodynamic stability will improve Outcome: Progressing   Problem: Clinical Measurements: Goal: Diagnostic test results will improve Outcome: Progressing Goal: Signs and symptoms of infection will decrease Outcome: Progressing   Problem: Respiratory: Goal: Ability to maintain adequate ventilation will improve Outcome: Progressing   Problem: Education: Goal: Knowledge of General Education information will improve Description: Including pain rating scale, medication(s)/side effects and non-pharmacologic comfort measures Outcome: Progressing   Problem: Health Behavior/Discharge Planning: Goal: Ability to manage health-related needs will improve Outcome: Progressing   Problem: Clinical Measurements: Goal: Ability to maintain clinical measurements within normal limits will improve Outcome: Progressing Goal: Will remain free from infection Outcome: Progressing Goal: Diagnostic test results will improve Outcome: Progressing Goal: Respiratory complications will improve Outcome: Progressing Goal: Cardiovascular complication will be avoided Outcome: Progressing   Problem: Activity: Goal: Risk for activity intolerance will decrease Outcome: Progressing   Problem: Nutrition: Goal: Adequate nutrition will be maintained Outcome: Progressing   Problem: Coping: Goal: Level of anxiety will decrease Outcome: Progressing   Problem: Elimination: Goal: Will not experience complications related to bowel motility Outcome: Progressing Goal: Will not experience complications related to urinary retention Outcome: Progressing   Problem: Pain Management: Goal: General experience of comfort will improve Outcome: Progressing   Problem: Safety: Goal: Ability to remain free from injury will improve Outcome: Progressing   Problem: Skin Integrity: Goal: Risk for impaired skin integrity will decrease Outcome: Progressing   Problem: Activity: Goal: Ability  to tolerate increased activity will improve Outcome: Progressing   Problem: Clinical Measurements: Goal: Ability to maintain a body temperature in the normal range will improve Outcome: Progressing   Problem: Respiratory: Goal: Ability to maintain adequate ventilation will improve Outcome: Progressing Goal: Ability to maintain a clear airway will improve Outcome: Progressing

## 2023-04-27 NOTE — Consult Note (Addendum)
 Consultation Note Date: 04/27/2023   Patient Name: [name]  DOB: [birth year]  MRN: [id number]  Age / Sex: 83 y.o., male  PCP: Glenard Mire, MD Referring Physician: Josette Ade, MD  Reason for Consultation: Establishing goals of care   HPI/Brief Hospital Course: 83 y.o. male  with past medical history of intellectual disability, BPH, HLD and wheelchair dependent admitted from [facility] on 04/22/2023 with AMS. Recent ED visit 12/27-diagnosed with UTI at that time discharged back to group home with antibiotic therapy. Unclear if patient was taking antibiotics. Blood cultures drawn at that time resulted-positive for proteus bacteremia. On arrival to ED 12/29 found to be hypoxic, febrile,  tachycardic and tachypneic.  Admitted and being treated for proteus bacteremia, UTI, acute hypoxic respiratory failure with concern for aspiration pneumonia.  Oxygen  has been successfully weaned down to 2L [place]  Course has been complicated by hypernatremia, concern for minimal PO intake and ability to meet nutritional and caloric needs  Palliative medicine was consulted for assisting with goals of care conversations.  Subjective:  Extensive chart review has been completed prior to meeting patient including labs, vital signs, imaging, progress notes, orders, and available advanced directive documents from current and previous encounters.  Visited with Mr. Dawn at his bedside. He is awake, alert, unable to participate in goals of care conversations due to underlying cognitive impairment. He is able to answer simple questions, primarily yes and no responses, speech faint and weak. Legal guardian in place-Rebecca Evern, not present at bedside during time of visit.  Spoke with primary team who has been in communication with [name]. Plan is for someone from [facility] to do a bedside assessment and report back to [name] as she is unable to  visit.  Called and left HIPAA complaint VM with [name], awaiting return call.  Able to speak with niece/legal guardian-Rebecca.  Introduced myself as a publishing rights manager as a member of the palliative care team. Explained palliative medicine is specialized medical care for people living with serious illness. It focuses on providing relief from the symptoms and stress of a serious illness. The goal is to improve quality of life for both the patient and the family.   We discussed patient's current illness and what it means in the larger context of patient's on-going co-morbidities. Natural disease trajectory and expectations at EOL were discussed.   [name] recently had conversation with Dr. Ginnie regarding code status, she confirms DNR but desires intubation if needed/appropriate. [name] is hopeful that Mr. Broughton is able to recover from acute illnesses and return to his group home. She acknowledges the severity of his illness and underlying comorbid conditions. She is focused on ensuring Mr. Sison is comfortable and does not suffer at the end of his life. We discussed the role of outpatient palliative for which she is agreeable to.   Answered and addressed all questions and concerns. PMT will continue to follow and support patient as needed.  Objective: Primary Diagnoses: Present on Admission:  Severe sepsis (HCC)  Dyslipidemia   Physical Exam Constitutional:      General: He is not in acute distress.    Appearance: He is ill-appearing.  Pulmonary:     Effort: Pulmonary effort is normal. No respiratory distress.  Abdominal:     General: There is no distension.     Palpations: Abdomen is soft.     Tenderness: There is no abdominal tenderness.  Skin:    General: Skin is warm and dry.  Neurological:  Mental Status: He is alert. He is disoriented.     Motor: Weakness present.     Vital Signs: BP 127/66 (BP Location: Right Leg)   Pulse (!) 58   Temp 98.7 F (37.1 C)  (Axillary)   Resp 16   Ht 5' 8 (1.727 m) Comment: stated by family  SpO2 96%   BMI 24.02 kg/m  Pain Scale: Faces POSS *See Group Information*: 1-Acceptable,Awake and alert Pain Score: 0-No pain  IO: Intake/output summary:  Intake/Output Summary (Last 24 hours) at 04/27/2023 1151 Last data filed at 04/27/2023 0900 Gross per 24 hour  Intake 220 ml  Output 1100 ml  Net -880 ml    LBM: Last BM Date : 04/26/23 Baseline Weight:   Most recent weight:         Assessment and Plan  SUMMARY OF RECOMMENDATIONS   Awaiting return call from legal guardian  Palliative Prophylaxis:   Bowel Regimen, Delirium Protocol and Frequent Pain Assessment  Discussed With: Primary team   Thank you for this consult and allowing Palliative Medicine to participate in the care of Malachai A. Bernardo Peter. Palliative medicine will continue to follow and assist as needed.   Time Total: 75 minutes  Time spent includes: Detailed review of medical records (labs, imaging, vital signs), medically appropriate exam (mental status, respiratory, cardiac, skin), discussed with treatment team, counseling and educating patient, family and staff, documenting clinical information, medication management and coordination of care.   Signed by: Waddell Lesches, DNP, AGNP-C Palliative Medicine    Please contact Palliative Medicine Team phone at [phone] for questions and concerns.  For individual provider: See Tracey

## 2023-04-28 DIAGNOSIS — E870 Hyperosmolality and hypernatremia: Secondary | ICD-10-CM | POA: Diagnosis not present

## 2023-04-28 DIAGNOSIS — Z515 Encounter for palliative care: Secondary | ICD-10-CM | POA: Diagnosis not present

## 2023-04-28 DIAGNOSIS — T17908A Unspecified foreign body in respiratory tract, part unspecified causing other injury, initial encounter: Secondary | ICD-10-CM | POA: Diagnosis not present

## 2023-04-28 DIAGNOSIS — J9601 Acute respiratory failure with hypoxia: Secondary | ICD-10-CM | POA: Diagnosis not present

## 2023-04-28 DIAGNOSIS — N3000 Acute cystitis without hematuria: Secondary | ICD-10-CM | POA: Diagnosis not present

## 2023-04-28 DIAGNOSIS — D696 Thrombocytopenia, unspecified: Secondary | ICD-10-CM | POA: Diagnosis not present

## 2023-04-28 DIAGNOSIS — A419 Sepsis, unspecified organism: Secondary | ICD-10-CM | POA: Diagnosis not present

## 2023-04-28 LAB — BASIC METABOLIC PANEL
Anion gap: 7 (ref 5–15)
BUN: 21 mg/dL (ref 8–23)
CO2: 25 mmol/L (ref 22–32)
Calcium: 8.3 mg/dL — ABNORMAL LOW (ref 8.9–10.3)
Chloride: 124 mmol/L — ABNORMAL HIGH (ref 98–111)
Creatinine, Ser: 0.65 mg/dL (ref 0.61–1.24)
GFR, Estimated: >60 mL/min (ref >60)
Glucose, Bld: 98 mg/dL (ref 70–99)
Potassium: 3.7 mmol/L (ref 3.5–5.1)
Sodium: 156 mmol/L — ABNORMAL HIGH (ref 135–145)

## 2023-04-28 LAB — PHOSPHORUS: Phosphorus: 2.6 mg/dL (ref 2.5–4.6)

## 2023-04-28 LAB — MAGNESIUM: Magnesium: 2.2 mg/dL (ref 1.7–2.4)

## 2023-04-28 MED ORDER — DEXTROSE 5 % IV SOLN: INTRAVENOUS | Status: AC

## 2023-04-28 MED ORDER — IPRATROPIUM-ALBUTEROL 0.5-2.5 (3) MG/3ML IN SOLN
3.0000 mL | Freq: Three times a day (TID) | RESPIRATORY_TRACT | Status: DC
  Administered 2023-04-28 – 2023-05-01 (×8): 3 mL via RESPIRATORY_TRACT
  Filled 2023-04-28 (×7): qty 3

## 2023-04-28 NOTE — Progress Notes (Signed)
 Progress Note   Patient: [name] FMW:[id number] DOB: 01-29-1941 DOA: 04/22/2023     6 DOS: the patient was seen and examined on 04/28/2023   Brief hospital course: 83 year old man past medical history of intellectual disability, hyperlipidemia, BPH, wheelchair dependent.  Patient was seen in the emergency room on December 27 was diagnosed with UTI.  Patient worsened on the group home with lethargy patient presented with acute respiratory failure and sepsis secondary to Proteus and aspiration pneumonia.   1/1.  Patient answers a few questions but cannot elaborate much.  Patient being treated for sepsis with IV antibiotics. 1/2.  Patient answers a few more questions today.  Needs to be fed.  Patient on dysphagia diet.  Still with hypernatremia. 1/3.  Patient asked me for water .  I fed him some cranberry juice thickened.  Potassium 2.9.  Sodium 149. 1/4.  Sodium 156.  Started on D5W.  Assessment and Plan: * Severe sepsis (HCC) Present on admission meeting sepsis criteria with Tmax of 102, heart rate 100s, respirations into the 30s.  Proteus growing out of blood cultures on 12/27.  Repeat blood cultures negative.  Aspiration pneumonia.  Patient with acute respiratory failure.  Patient on Unasyn  and completed Zithromax .  Acute respiratory failure with hypoxia (HCC) On presentation patient had a pulse ox of 91% on 3 L and quickly needed to go up to heated high flow nasal cannula 60% FiO2.  Patient's oxygenation has improved down to 1 L.  Hypernatremia Unlikely to keep up with nutritional needs.  Patient on dysphagia diet and needs to be fed.  Sodium 156 today.  Start D5W.  Appreciate palliative care consultation.  High sodium is a poor prognostic indicator.  Thrombocytopenia (HCC) Likely secondary to sepsis, last platelet count 113.  Hypokalemia Replaced  UTI (urinary tract infection) Recent diagnosis December 27 of UTI-placed on course of Keflex  outpatient.  On Unasyn   here.  Intellectual disability Baseline intellectual disability Group home resident  Dysphagia Continue dysphagia diet  Dyslipidemia Lipitor  Macrocytic anemia Continue to monitor, last hemoglobin 10.2  Diarrhea Discontinue Amitiza   Sacral decubitus ulcer, stage II (HCC) Documented on 1/2.  Stage II.  Appreciate wound care consultation.       Subjective: Patient seen after he ate breakfast.  He looks like he did a good job with the tray.  Sodium even higher today at 156.  Admitted with severe sepsis.  Physical Exam: Vitals:   04/27/23 1747 04/27/23 1948 04/28/23 0447 04/28/23 0724  BP: 106/65 104/69 125/74 131/73  Pulse: 65 66 62 (!) 58  Resp: 16     Temp: 97.9 F (36.6 C) 97.6 F (36.4 C) 98 F (36.7 C) 97.8 F (36.6 C)  TempSrc:      SpO2: 97% 98% 99% 99%  Height:       Physical Exam HENT:     Head: Normocephalic.  Eyes:     General: Lids are normal.     Conjunctiva/sclera: Conjunctivae normal.  Cardiovascular:     Rate and Rhythm: Normal rate and regular rhythm.     Heart sounds: Normal heart sounds, S1 normal and S2 normal.  Pulmonary:     Breath sounds: Transmitted upper airway sounds present. Examination of the right-lower field reveals decreased breath sounds. Examination of the left-lower field reveals decreased breath sounds. Decreased breath sounds present. No wheezing, rhonchi or rales.  Abdominal:     Palpations: Abdomen is soft.     Tenderness: There is no abdominal tenderness.  Musculoskeletal:  Right lower leg: Swelling present.     Left lower leg: Swelling present.     Comments: Both feet extended downward.  Skin:    General: Skin is warm.     Findings: No rash.  Neurological:     Mental Status: He is alert.     Data Reviewed: Sodium 156, creatinine 0.65  Family Communication: Spoke with POA  Disposition: Status is: Inpatient Remains inpatient appropriate because: Started D5W for hypernatremia  Planned Discharge  Destination: Group home    Time spent: 28 minutes  Author: Charlie Patterson, MD 04/28/2023 2:33 PM  For on call review [email or web address].

## 2023-04-28 NOTE — Progress Notes (Signed)
 Daily Progress Note   Patient Name: [name]       Date: 04/28/2023 DOB: [birth year]  Age: 83 y.o. MRN#: [id number] Attending Physician: Josette Ade, MD Primary Care Physician: Sowles, Krichna, MD Admit Date: 04/22/2023  Reason for Consultation/Follow-up: Establishing goals of care  HPI/Brief Hospital Review: 83 y.o. male  with past medical history of intellectual disability, BPH, HLD and wheelchair dependent admitted from [facility] on 04/22/2023 with AMS. Recent ED visit 12/27-diagnosed with UTI at that time discharged back to group home with antibiotic therapy. Unclear if patient was taking antibiotics. Blood cultures drawn at that time resulted-positive for proteus bacteremia. On arrival to ED 12/29 found to be hypoxic, febrile,  tachycardic and tachypneic.   Admitted and being treated for proteus bacteremia, UTI, acute hypoxic respiratory failure with concern for aspiration pneumonia.   Oxygen  has been successfully weaned down to 2L [place]   Course has been complicated by hypernatremia, concern for minimal PO intake and ability to meet nutritional and caloric needs.   Palliative medicine was consulted for assisting with goals of care conversations.   Subjective: Extensive chart review has been completed prior to meeting patient including labs, vital signs, imaging, progress notes, orders, and available advanced directive documents from current and previous encounters.    Visited with [name] at his bedside. He is awake, alert, able to answer simple questions. Nursing staff providing hygiene at bedside. Staff reports they assisted Mr. Gover with breakfast, ate the majority of food from his tray, did not drink much. No family at bedside during time of  visit.  Attempted to reach legal guardian-Rebecca, HIPAA complaint VM left, awaiting return call. Concern for ongoing and worsening hypernatremia.  PMT will continue to follow for ongoing needs and support.  Care plan was discussed with primary team  Thank you for allowing the Palliative Medicine Team to assist in the care of this patient.  Total time:  25 minutes  Time spent includes: Detailed review of medical records (labs, imaging, vital signs), medically appropriate exam (mental status, respiratory, cardiac, skin), discussed with treatment team, counseling and educating patient, family and staff, documenting clinical information, medication management and coordination of care.  Waddell Lesches, DNP, AGNP-C Palliative Medicine   Please contact Palliative Medicine Team phone at [phone] for questions and concerns.

## 2023-04-28 NOTE — Plan of Care (Signed)
  Problem: Clinical Measurements: Goal: Diagnostic test results will improve Outcome: Progressing   Problem: Respiratory: Goal: Ability to maintain adequate ventilation will improve Outcome: Progressing   Problem: Health Behavior/Discharge Planning: Goal: Ability to manage health-related needs will improve Outcome: Progressing   Problem: Activity: Goal: Risk for activity intolerance will decrease Outcome: Progressing   Problem: Clinical Measurements: Goal: Cardiovascular complication will be avoided Outcome: Progressing   Problem: Elimination: Goal: Will not experience complications related to urinary retention Outcome: Progressing

## 2023-04-29 DIAGNOSIS — N3000 Acute cystitis without hematuria: Secondary | ICD-10-CM | POA: Diagnosis not present

## 2023-04-29 DIAGNOSIS — J9601 Acute respiratory failure with hypoxia: Secondary | ICD-10-CM | POA: Diagnosis not present

## 2023-04-29 DIAGNOSIS — A419 Sepsis, unspecified organism: Secondary | ICD-10-CM | POA: Diagnosis not present

## 2023-04-29 DIAGNOSIS — T17908A Unspecified foreign body in respiratory tract, part unspecified causing other injury, initial encounter: Secondary | ICD-10-CM | POA: Diagnosis not present

## 2023-04-29 DIAGNOSIS — E870 Hyperosmolality and hypernatremia: Secondary | ICD-10-CM | POA: Diagnosis not present

## 2023-04-29 DIAGNOSIS — D696 Thrombocytopenia, unspecified: Secondary | ICD-10-CM | POA: Diagnosis not present

## 2023-04-29 DIAGNOSIS — Z515 Encounter for palliative care: Secondary | ICD-10-CM | POA: Diagnosis not present

## 2023-04-29 LAB — BASIC METABOLIC PANEL
Anion gap: 5 (ref 5–15)
BUN: 20 mg/dL (ref 8–23)
CO2: 27 mmol/L (ref 22–32)
Calcium: 8.1 mg/dL — ABNORMAL LOW (ref 8.9–10.3)
Chloride: 118 mmol/L — ABNORMAL HIGH (ref 98–111)
Creatinine, Ser: 0.6 mg/dL — ABNORMAL LOW (ref 0.61–1.24)
GFR, Estimated: >60 mL/min (ref >60)
Glucose, Bld: 92 mg/dL (ref 70–99)
Potassium: 3.5 mmol/L (ref 3.5–5.1)
Sodium: 150 mmol/L — ABNORMAL HIGH (ref 135–145)

## 2023-04-29 MED ORDER — POTASSIUM CHLORIDE CRYS ER 20 MEQ PO TBCR
20.0000 meq | EXTENDED_RELEASE_TABLET | Freq: Once | ORAL | Status: AC
  Administered 2023-04-29: 20 meq via ORAL
  Filled 2023-04-29: qty 1

## 2023-04-29 NOTE — Progress Notes (Signed)
 Daily Progress Note   Patient Name: [name]       Date: 04/29/2023 DOB: [birth year]  Age: 83 y.o. MRN#: [id number] Attending Physician: Kevin Ade, MD Primary Care Physician: Sowles, Krichna, MD Admit Date: 04/22/2023  Reason for Consultation/Follow-up: Establishing goals of care  HPI/Brief Hospital Review: 83 y.o. male  with past medical history of intellectual disability, BPH, HLD and wheelchair dependent admitted from [facility] on 04/22/2023 with AMS. Recent ED visit 12/27-diagnosed with UTI at that time discharged back to group home with antibiotic therapy. Unclear if patient was taking antibiotics. Blood cultures drawn at that time resulted-positive for proteus bacteremia. On arrival to ED 12/29 found to be hypoxic, febrile,  tachycardic and tachypneic.   Admitted and being treated for proteus bacteremia, UTI, acute hypoxic respiratory failure with concern for aspiration pneumonia.   Oxygen  has been successfully weaned down to 2L [place]   Course has been complicated by hypernatremia, concern for minimal PO intake and ability to meet nutritional and caloric needs.   Palliative medicine was consulted for assisting with goals of care conversations.  Subjective: Extensive chart review has been completed prior to meeting patient including labs, vital signs, imaging, progress notes, orders, and available advanced directive documents from current and previous encounters.    Visited with [name] at his bedside. He is awake, alert, able to answer and respond to simple questions appropriately. Nursing staff at bedside assisting with breakfast, he was able to tolerate majority of food on tray.  Called and spoke with niece-[name]. We discussed improvement in PO intake  over the last few days as well as improvement in sodium levels today. We discussed although [name] is showing signs of improvement concern remains for overall poor prognosis long term. We discussed the difference between having outpatient palliative care and/or hospice services following at discharge. At this time, [name] feels outpatient palliative would be most appropriate but prior to referral would like to speak to staff at group home for their approval. Shared with [name] to discuss with primary team once decision made.  No acute ongoing palliative needs at this time. PMT will continue to follow along peripherally, please reengage as appropriate. Encouraged [name] to reach out to PMT if needs arise.  Thank you for allowing the Palliative Medicine Team to assist in the  care of this patient.  Total time:  25 minutes  Time spent includes: Detailed review of medical records (labs, imaging, vital signs), medically appropriate exam (mental status, respiratory, cardiac, skin), discussed with treatment team, counseling and educating patient, family and staff, documenting clinical information, medication management and coordination of care.  Kevin Lesches, DNP, AGNP-C Palliative Medicine   Please contact Palliative Medicine Team phone at [phone] for questions and concerns.

## 2023-04-29 NOTE — Progress Notes (Signed)
 Progress Note   Patient: [name] FMW:[id number] DOB: [birth year] DOA: 04/22/2023     7 DOS: the patient was seen and examined on 04/29/2023   Brief hospital course: 83 year old man past medical history of intellectual disability, hyperlipidemia, BPH, wheelchair dependent.  Patient was seen in the emergency room on December 27 was diagnosed with UTI.  Patient worsened on the group home with lethargy patient presented with acute respiratory failure and sepsis secondary to Proteus and aspiration pneumonia.   1/1.  Patient answers a few questions but cannot elaborate much.  Patient being treated for sepsis with IV antibiotics. 1/2.  Patient answers a few more questions today.  Needs to be fed.  Patient on dysphagia diet.  Still with hypernatremia. 1/3.  Patient asked me for water .  I fed him some cranberry juice thickened.  Potassium 2.9.  Sodium 149. 1/4.  Sodium 156.  Started on D5W.  Assessment and Plan: * Severe sepsis (HCC) Present on admission meeting sepsis criteria with Tmax of 102, heart rate 100s, respirations into the 30s.  Proteus growing out of blood cultures on 12/27.  Repeat blood cultures negative.  Aspiration pneumonia.  Patient with acute respiratory failure.  Patient on Unasyn  and completed Zithromax .  Acute respiratory failure with hypoxia (HCC) On presentation patient had a pulse ox of 91% on 3 L and quickly needed to go up to heated high flow nasal cannula 60% FiO2.  Patient's oxygenation has improved down to 2 L.  Will check a pulse ox on room air.  Hypernatremia Unlikely to keep up with nutritional needs.  Patient on dysphagia diet and needs to be fed.  Sodium 150 today.  Appreciate palliative care consultation.  High sodium is a poor prognostic indicator.  Check sodium again tomorrow.  Thrombocytopenia (HCC) Likely secondary to sepsis, last platelet count 113.  Hypokalemia Replace today.  UTI (urinary tract infection) Recent diagnosis December 27 of  UTI-placed on course of Keflex  outpatient.  On Unasyn  here.  Intellectual disability Baseline intellectual disability Group home resident  Dysphagia Continue dysphagia diet  Dyslipidemia Lipitor  Macrocytic anemia Continue to monitor, last hemoglobin 10.2  Diarrhea Discontinued Amitiza .  Check hemoglobin tomorrow.  Sacral decubitus ulcer, stage II (HCC) Documented on 1/2.  Stage II.  Appreciate wound care consultation.       Subjective: Patient answers some simple questions.  Sodium improved to 150.  Admitted with sepsis.  Physical Exam: Vitals:   04/28/23 1603 04/28/23 1937 04/29/23 0433 04/29/23 0726  BP: 104/65 (!) 93/54 125/77 120/73  Pulse: 69 62 (!) 55 (!) 55  Resp:  20 16 16   Temp: (!) 96.7 F (35.9 C) 97.7 F (36.5 C) (!) 97.5 F (36.4 C) (!) 97.4 F (36.3 C)  TempSrc:      SpO2: 100% 100% 100% 100%  Height:       Physical Exam HENT:     Head: Normocephalic.  Eyes:     General: Lids are normal.     Conjunctiva/sclera: Conjunctivae normal.  Cardiovascular:     Rate and Rhythm: Normal rate and regular rhythm.     Heart sounds: Normal heart sounds, S1 normal and S2 normal.  Pulmonary:     Breath sounds: Transmitted upper airway sounds present. Examination of the right-lower field reveals decreased breath sounds. Examination of the left-lower field reveals decreased breath sounds. Decreased breath sounds present. No wheezing, rhonchi or rales.  Abdominal:     Palpations: Abdomen is soft.     Tenderness: There is no  abdominal tenderness.  Musculoskeletal:     Right lower leg: Swelling present.     Left lower leg: Swelling present.     Comments: Both feet extended downward.  Skin:    General: Skin is warm.     Findings: No rash.  Neurological:     Mental Status: He is alert.     Data Reviewed: Sodium 150, creatinine 0.6, potassium 3.5  Family Communication: Left message for POA  Disposition: Status is: Inpatient Remains inpatient appropriate  because: will have to see if group home will accept back.  Planned Discharge Destination: group home    Time spent: 28 minutes  Author: Charlie Patterson, MD 04/29/2023 3:08 PM  For on call review [email or web address].

## 2023-04-29 NOTE — Plan of Care (Signed)
  Problem: Fluid Volume: Goal: Hemodynamic stability will improve Outcome: Progressing   Problem: Clinical Measurements: Goal: Diagnostic test results will improve Outcome: Progressing Goal: Signs and symptoms of infection will decrease Outcome: Progressing   Problem: Respiratory: Goal: Ability to maintain adequate ventilation will improve Outcome: Progressing   Problem: Education: Goal: Knowledge of General Education information will improve Description: Including pain rating scale, medication(s)/side effects and non-pharmacologic comfort measures Outcome: Progressing   Problem: Health Behavior/Discharge Planning: Goal: Ability to manage health-related needs will improve Outcome: Progressing   Problem: Clinical Measurements: Goal: Ability to maintain clinical measurements within normal limits will improve Outcome: Progressing Goal: Will remain free from infection Outcome: Progressing Goal: Diagnostic test results will improve Outcome: Progressing Goal: Respiratory complications will improve Outcome: Progressing Goal: Cardiovascular complication will be avoided Outcome: Progressing   Problem: Activity: Goal: Risk for activity intolerance will decrease Outcome: Progressing   Problem: Nutrition: Goal: Adequate nutrition will be maintained Outcome: Progressing   Problem: Coping: Goal: Level of anxiety will decrease Outcome: Progressing   Problem: Elimination: Goal: Will not experience complications related to bowel motility Outcome: Progressing Goal: Will not experience complications related to urinary retention Outcome: Progressing   Problem: Pain Management: Goal: General experience of comfort will improve Outcome: Progressing   Problem: Safety: Goal: Ability to remain free from injury will improve Outcome: Progressing   Problem: Skin Integrity: Goal: Risk for impaired skin integrity will decrease Outcome: Progressing   Problem: Activity: Goal: Ability  to tolerate increased activity will improve Outcome: Progressing   Problem: Clinical Measurements: Goal: Ability to maintain a body temperature in the normal range will improve Outcome: Progressing   Problem: Respiratory: Goal: Ability to maintain adequate ventilation will improve Outcome: Progressing Goal: Ability to maintain a clear airway will improve Outcome: Progressing

## 2023-04-30 DIAGNOSIS — J9601 Acute respiratory failure with hypoxia: Secondary | ICD-10-CM | POA: Diagnosis not present

## 2023-04-30 DIAGNOSIS — E876 Hypokalemia: Secondary | ICD-10-CM | POA: Diagnosis not present

## 2023-04-30 DIAGNOSIS — E870 Hyperosmolality and hypernatremia: Secondary | ICD-10-CM | POA: Diagnosis not present

## 2023-04-30 DIAGNOSIS — B370 Candidal stomatitis: Secondary | ICD-10-CM | POA: Insufficient documentation

## 2023-04-30 DIAGNOSIS — D696 Thrombocytopenia, unspecified: Secondary | ICD-10-CM | POA: Diagnosis not present

## 2023-04-30 LAB — CBC
HCT: 31.9 % — ABNORMAL LOW (ref 39.0–52.0)
Hemoglobin: 10.8 g/dL — ABNORMAL LOW (ref 13.0–17.0)
MCH: 34 pg (ref 26.0–34.0)
MCHC: 33.9 g/dL (ref 30.0–36.0)
MCV: 100.3 fL — ABNORMAL HIGH (ref 80.0–100.0)
Platelets: 158 10*3/uL (ref 150–400)
RBC: 3.18 MIL/uL — ABNORMAL LOW (ref 4.22–5.81)
RDW: 14.4 % (ref 11.5–15.5)
WBC: 4 10*3/uL (ref 4.0–10.5)
nRBC: 0 % (ref 0.0–0.2)

## 2023-04-30 LAB — BASIC METABOLIC PANEL
Anion gap: 8 (ref 5–15)
BUN: 17 mg/dL (ref 8–23)
CO2: 25 mmol/L (ref 22–32)
Calcium: 8.2 mg/dL — ABNORMAL LOW (ref 8.9–10.3)
Chloride: 116 mmol/L — ABNORMAL HIGH (ref 98–111)
Creatinine, Ser: 0.52 mg/dL — ABNORMAL LOW (ref 0.61–1.24)
GFR, Estimated: >60 mL/min (ref >60)
Glucose, Bld: 76 mg/dL (ref 70–99)
Potassium: 3.8 mmol/L (ref 3.5–5.1)
Sodium: 149 mmol/L — ABNORMAL HIGH (ref 135–145)

## 2023-04-30 MED ORDER — AMOXICILLIN 500 MG PO CAPS
1000.0000 mg | ORAL_CAPSULE | Freq: Three times a day (TID) | ORAL | Status: DC
  Administered 2023-04-30 – 2023-05-01 (×3): 1000 mg via ORAL
  Filled 2023-04-30 (×4): qty 2

## 2023-04-30 NOTE — Plan of Care (Signed)
  Problem: Fluid Volume: Goal: Hemodynamic stability will improve Outcome: Progressing   Problem: Clinical Measurements: Goal: Diagnostic test results will improve Outcome: Progressing Goal: Signs and symptoms of infection will decrease Outcome: Progressing   Problem: Respiratory: Goal: Ability to maintain adequate ventilation will improve Outcome: Progressing   Problem: Education: Goal: Knowledge of General Education information will improve Description: Including pain rating scale, medication(s)/side effects and non-pharmacologic comfort measures Outcome: Progressing   Problem: Health Behavior/Discharge Planning: Goal: Ability to manage health-related needs will improve Outcome: Progressing   Problem: Clinical Measurements: Goal: Ability to maintain clinical measurements within normal limits will improve Outcome: Progressing Goal: Will remain free from infection Outcome: Progressing Goal: Diagnostic test results will improve Outcome: Progressing Goal: Respiratory complications will improve Outcome: Progressing Goal: Cardiovascular complication will be avoided Outcome: Progressing   Problem: Activity: Goal: Risk for activity intolerance will decrease Outcome: Progressing   Problem: Nutrition: Goal: Adequate nutrition will be maintained Outcome: Progressing   Problem: Coping: Goal: Level of anxiety will decrease Outcome: Progressing   Problem: Elimination: Goal: Will not experience complications related to bowel motility Outcome: Progressing Goal: Will not experience complications related to urinary retention Outcome: Progressing   Problem: Pain Management: Goal: General experience of comfort will improve Outcome: Progressing   Problem: Safety: Goal: Ability to remain free from injury will improve Outcome: Progressing   Problem: Skin Integrity: Goal: Risk for impaired skin integrity will decrease Outcome: Progressing   Problem: Activity: Goal: Ability  to tolerate increased activity will improve Outcome: Progressing   Problem: Clinical Measurements: Goal: Ability to maintain a body temperature in the normal range will improve Outcome: Progressing   Problem: Respiratory: Goal: Ability to maintain adequate ventilation will improve Outcome: Progressing Goal: Ability to maintain a clear airway will improve Outcome: Progressing

## 2023-04-30 NOTE — Assessment & Plan Note (Signed)
 Improving on the tongue.  Continue Diflucan

## 2023-04-30 NOTE — Progress Notes (Signed)
 Progress Note   Patient: [name] FMW:[id number] DOB: 01/04/1941 DOA: 04/22/2023     8 DOS: the patient was seen and examined on 04/30/2023   Brief hospital course: 83 year old man past medical history of intellectual disability, hyperlipidemia, BPH, wheelchair dependent.  Patient was seen in the emergency room on December 27 was diagnosed with UTI.  Patient worsened on the group home with lethargy patient presented with acute respiratory failure and sepsis secondary to Proteus and aspiration pneumonia.   1/1.  Patient answers a few questions but cannot elaborate much.  Patient being treated for sepsis with IV antibiotics. 1/2.  Patient answers a few more questions today.  Needs to be fed.  Patient on dysphagia diet.  Still with hypernatremia. 1/3.  Patient asked me for water .  I fed him some cranberry juice thickened.  Potassium 2.9.  Sodium 149. 1/4.  Sodium 156.  Started on D5W. 1/5.  Sodium 150 1/6.  Sodium 149, hemoglobin 10.8.  Switched antibiotic over to amoxicillin  to complete total of 10 days.  TOC awaiting to hear back from group home on whether they will take him back.  Assessment and Plan: * Severe sepsis (HCC) Present on admission meeting sepsis criteria with Tmax of 102, heart rate 100s, respirations into the 30s.  Proteus growing out of blood cultures on 12/27.  Repeat blood cultures negative.  Aspiration pneumonia.  Patient with acute respiratory failure.  Patient completed Zithromax .  Patient was on Unasyn  and switched over to amoxicillin  for few more doses to complete course.  Acute respiratory failure with hypoxia (HCC) On presentation patient had a pulse ox of 91% on 3 L and quickly needed to go up to heated high flow nasal cannula 60% FiO2.  Patient's oxygenation has improved during the hospital course and now on room air.  Hypernatremia Unlikely to keep up with nutritional needs.  Patient on dysphagia diet and needs to be fed.  Sodium 149 today.  Appreciate  palliative care consultation.  High sodium is a poor prognostic indicator.    Thrombocytopenia (HCC) Likely secondary to sepsis, last platelet count 158.  Hypokalemia Replaced.  UTI (urinary tract infection) Recent diagnosis December 27 of UTI-placed on course of Keflex  outpatient.  On Unasyn  here.  Intellectual disability Baseline intellectual disability Group home resident  Dysphagia Continue dysphagia diet  Dyslipidemia Lipitor  Macrocytic anemia Continue to monitor, last hemoglobin 10.2  Thrush Continue Diflucan .  Diarrhea Discontinued Amitiza .  Check hemoglobin tomorrow.  Sacral decubitus ulcer, stage II (HCC) Documented on 1/2.  Stage II.  Appreciate wound care consultation.   Subjective: Patient feels okay.  Offers no complaints.  Sodium still elevated at 149.  Patient off oxygen .  Admitted with severe sepsis.  Physical Exam: Vitals:   04/29/23 2040 04/30/23 0735 04/30/23 0900 04/30/23 1542  BP:  113/85  (!) 91/49  Pulse:  (!) 54  (!) 57  Resp:  14  14  Temp:   97.6 F (36.4 C)   TempSrc:   Axillary   SpO2: 100% 100% 98% 99%  Height:       Physical Exam HENT:     Head: Normocephalic.  Eyes:     General: Lids are normal.     Conjunctiva/sclera: Conjunctivae normal.  Cardiovascular:     Rate and Rhythm: Normal rate and regular rhythm.     Heart sounds: Normal heart sounds, S1 normal and S2 normal.  Pulmonary:     Breath sounds: No transmitted upper airway sounds. Examination of the right-lower field  reveals decreased breath sounds and rhonchi. Examination of the left-lower field reveals decreased breath sounds and rhonchi. Decreased breath sounds and rhonchi present. No wheezing or rales.  Abdominal:     Palpations: Abdomen is soft.     Tenderness: There is no abdominal tenderness.  Musculoskeletal:     Right lower leg: Swelling present.     Left lower leg: Swelling present.     Comments: Both feet extended downward.  Skin:    General: Skin is  warm.     Findings: No rash.  Neurological:     Mental Status: He is alert.     Data Reviewed: Creatinine 0.52, sodium 149, white blood count 4.0, hemoglobin 10.8, platelet count 158 Family Communication: Spoke with POA on the phone  Disposition: Status is: Inpatient Remains inpatient appropriate because: TOC trying to contact group home about whether they can take him back.  Planned Discharge Destination: Group home    Time spent: 28 minutes  Author: Charlie Patterson, MD 04/30/2023 4:58 PM  For on call review [email or web address].

## 2023-05-01 DIAGNOSIS — R7881 Bacteremia: Secondary | ICD-10-CM | POA: Diagnosis not present

## 2023-05-01 DIAGNOSIS — J9601 Acute respiratory failure with hypoxia: Secondary | ICD-10-CM | POA: Diagnosis not present

## 2023-05-01 DIAGNOSIS — E870 Hyperosmolality and hypernatremia: Secondary | ICD-10-CM | POA: Diagnosis not present

## 2023-05-01 DIAGNOSIS — D696 Thrombocytopenia, unspecified: Secondary | ICD-10-CM | POA: Diagnosis not present

## 2023-05-01 MED ORDER — FLUCONAZOLE 100 MG PO TABS: 100.0000 mg | ORAL_TABLET | Freq: Every day | ORAL | 0 refills | Status: DC

## 2023-05-01 MED ORDER — ALPRAZOLAM 0.25 MG PO TABS: 0.2500 mg | ORAL_TABLET | Freq: Every day | ORAL | 0 refills | Status: DC | PRN

## 2023-05-01 MED ORDER — PANTOPRAZOLE SODIUM 40 MG PO TBEC: 40.0000 mg | DELAYED_RELEASE_TABLET | Freq: Every day | ORAL | 0 refills | Status: DC

## 2023-05-01 MED ORDER — AMOXICILLIN 500 MG PO CAPS: 1000.0000 mg | ORAL_CAPSULE | Freq: Three times a day (TID) | ORAL | 0 refills | Status: AC

## 2023-05-01 MED ORDER — IPRATROPIUM-ALBUTEROL 0.5-2.5 (3) MG/3ML IN SOLN: 3.0000 mL | RESPIRATORY_TRACT | Status: DC | PRN

## 2023-05-01 MED ORDER — FOOD THICKENER (THICKENUP CLEAR): 1.0000 | ORAL | 0 refills | Status: DC | PRN

## 2023-05-01 NOTE — Plan of Care (Addendum)
 Patient alert to self. Speech is slurred and mostly incomprehensible. On dysphagia diet all meds crushed and given in icecream, patient compliant appropriate with staff. Position changed approx q 2 hours although patient frequently reverts to right side. Called back sister Bruna for update and facilitated communication. All safety precautions maintained. Bed locked in lowest position and call bell within reach.   Problem: Fluid Volume: Goal: Hemodynamic stability will improve Outcome: Progressing   Problem: Clinical Measurements: Goal: Signs and symptoms of infection will decrease Outcome: Progressing   Problem: Respiratory: Goal: Ability to maintain adequate ventilation will improve Outcome: Progressing   Problem: Health Behavior/Discharge Planning: Goal: Ability to manage health-related needs will improve Outcome: Progressing   Problem: Clinical Measurements: Goal: Cardiovascular complication will be avoided Outcome: Progressing   Problem: Nutrition: Goal: Adequate nutrition will be maintained Outcome: Progressing

## 2023-05-01 NOTE — Discharge Summary (Addendum)
 Physician Discharge Summary   Patient: [name] MRN: [id number] DOB: 09/09/1940  Admit date:     04/22/2023  Discharge date: 05/01/23  Discharge Physician: Charlie Patterson   PCP: Glenard Mire, MD   Recommendations at discharge:    Follow up PCP 5 days Follow up wound care center Palliative care outpatient follow up  Discharge Diagnoses: Principal Problem:   Severe sepsis (HCC) Active Problems:   Acute respiratory failure with hypoxia (HCC)   Hypernatremia   Thrombocytopenia (HCC)   Hypokalemia   UTI (urinary tract infection)   Intellectual disability   Dysphagia   Dyslipidemia   Macrocytic anemia   Bacteremia due to Proteus species   Sacral decubitus ulcer, stage II (HCC)   Diarrhea   [facility] Course: 83 year old man past medical history of intellectual disability, hyperlipidemia, BPH, wheelchair dependent.  Patient was seen in the emergency room on December 27 was diagnosed with UTI.  Patient worsened on the group home with lethargy patient presented with acute respiratory failure and sepsis secondary to Proteus and aspiration pneumonia.   1/1.  Patient answers a few questions but cannot elaborate much.  Patient being treated for sepsis with IV antibiotics. 1/2.  Patient answers a few more questions today.  Needs to be fed.  Patient on dysphagia diet.  Still with hypernatremia. 1/3.  Patient asked me for water .  I fed him some cranberry juice thickened.  Potassium 2.9.  Sodium 149. 1/4.  Sodium 156.  Started on D5W. 1/5.  Sodium 150 1/6.  Sodium 149, hemoglobin 10.8.  Switched antibiotic over to amoxicillin  to complete total of 10 days.  TOC awaiting to hear back from group home on whether they will take him back. 1/7.  Group home accepted back, will refer to wound care center. Palliative care as outpatient.  Okay to use Ensure with thickener.  Assessment and Plan: * Severe sepsis (HCC) Present on admission meeting sepsis criteria with Tmax of  102, heart rate 100s, respirations into the 30s.  Proteus growing out of blood cultures on 12/27.  Repeat blood cultures negative.  Aspiration pneumonia.  Patient with acute respiratory failure.  Patient completed Zithromax .  Patient was on Unasyn  and switched over to amoxicillin  for few more doses to complete course.  Acute respiratory failure with hypoxia (HCC) On presentation patient had a pulse ox of 91% on 3 L and quickly needed to go up to heated high flow nasal cannula 60% FiO2.  Patient's oxygenation has improved during the hospital course and now on room air.  Hypernatremia Unlikely to keep up with nutritional needs.  Patient on dysphagia diet and needs to be fed with every meal and must encourage drinking between meals.  Sodium 149.  Appreciate palliative care consultation.  High sodium is a poor prognostic indicator.  Palliative care to follow as outpatient.  Check bmp in one week.  Can consider converting to hospice if declines.  Thrombocytopenia (HCC) Likely secondary to sepsis, last platelet count 158.  Hypokalemia Replaced.  UTI (urinary tract infection) Recent diagnosis December 27 of UTI-placed on course of Keflex  outpatient.  On Unasyn  here and switched to amoxicillin .  Intellectual disability Baseline intellectual disability Group home resident  Dysphagia Continue dysphagia diet 1 diet with nectar thick liquids  Dyslipidemia Lipitor  Macrocytic anemia Continue to monitor, last hemoglobin 10.8  Thrush Continue Diflucan .  Diarrhea Discontinued Amitiza .  Patient stool were blackish green.  Hemoglobin stable, this is not a GI bleed.  Sacral decubitus ulcer, stage  II (HCC) Documented on 1/2.  Stage II.  Appreciate wound care consultation.  Cleanse wound buttock with saline and pat dry, cover with single layer of xeroform gauze and top with foam.  Change every other day.        Consultants: none Procedures performed: none Disposition: group home Diet  recommendation:  Dysphagia 1 diet with nectar thick liquids DISCHARGE MEDICATION: Allergies as of 05/01/2023   No Known Allergies      Medication List     STOP taking these medications    cephALEXin  500 MG capsule Commonly known as: KEFLEX    cetirizine  10 MG tablet Commonly known as: ZYRTEC    Cranberry Juice Powder 425 MG Caps   Fusion Plus Caps   lubiprostone  24 MCG capsule Commonly known as: AMITIZA        TAKE these medications    acetaminophen  325 MG tablet Commonly known as: TYLENOL  TAKE 2 TABLETS (650 MG TOTAL) BY MOUTH EVERY 6 HOURS AS NEEDED FOR MILD PAIN OR HEADACHE.   alfuzosin  10 MG 24 hr tablet Commonly known as: UROXATRAL  Take 1 tablet (10 mg total) by mouth daily.   ALPRAZolam  0.25 MG tablet Commonly known as: XANAX  Take 1 tablet (0.25 mg total) by mouth daily as needed (agitation (crisis med)).   amoxicillin  500 MG capsule Commonly known as: AMOXIL  Take 2 capsules (1,000 mg total) by mouth every 8 (eight) hours for 3 doses.   atorvastatin  40 MG tablet Commonly known as: LIPITOR Take 1 tablet (40 mg total) by mouth every evening.   augmented betamethasone  dipropionate 0.05 % cream Commonly known as: DIPROLENE -AF APPLY TOPICALLY AS DIRECTED TO RASH AS NEEDED   benzonatate  100 MG capsule Commonly known as: TESSALON  Take 2 capsules (200 mg total) by mouth 2 (two) times daily as needed for cough.   Calcium  + Vitamin D3 600-10 MG-MCG Tabs Generic drug: Calcium  Carb-Cholecalciferol  TAKE 1 TABLET BY MOUTH TWICE DAILY.   Depend Silhouette Briefs L/XL Misc 1 each by Does not apply route 2 (two) times daily as needed.   Ensure Original Liqd DRINK 1 BOTTLE BY MOUTH 3 TIMES DAILY   fluconazole  100 MG tablet Commonly known as: DIFLUCAN  Take 1 tablet (100 mg total) by mouth daily. Start taking on: May 02, 2023   fluticasone  50 MCG/ACT nasal spray Commonly known as: FLONASE  Place 2 sprays into both nostrils daily.   food thickener  Powd Commonly known as: RESOURCE THICKENUP CLEAR Take 120 g by mouth as needed.   GNP Vitamin C  500 MG tablet Generic drug: ascorbic acid  TAKE 1 TABLET BY MOUTH ONCE DAILY   guaiFENesin  600 MG 12 hr tablet Commonly known as: Mucinex  Take 1 tablet (600 mg total) by mouth 2 (two) times daily as needed for cough.   nystatin  cream Commonly known as: MYCOSTATIN  APPLY 1 APPLICATION TOPICALLY 2 TIMES DAILY   pantoprazole  40 MG tablet Commonly known as: PROTONIX  Take 1 tablet (40 mg total) by mouth daily. Start taking on: May 02, 2023   sertraline  100 MG tablet Commonly known as: ZOLOFT  Take 1 tablet (100 mg total) by mouth daily.   SYSTANE ULTRA OP Place 1 drop into both eyes 2 (two) times daily as needed (dry eyes).        Follow-up Information     Sowles, Krichna, MD Follow up in 5 day(s).   Specialty: Family Medicine Why: Hospital follow up Contact information: [address] [place] [address] [phone]         [facility] Follow up in 1 week(s).   Specialty: Wound Care Why: sacral decubiti Contact information: [address] [phone]               Discharge Exam: Physical Exam HENT:     Head: Normocephalic.  Eyes:     General: Lids are normal.     Conjunctiva/sclera: Conjunctivae normal.  Cardiovascular:     Rate and Rhythm: Normal rate and regular rhythm.     Heart sounds: Normal heart sounds, S1 normal and S2 normal.  Pulmonary:     Breath sounds: No transmitted upper airway sounds. Examination of the right-lower field reveals decreased breath sounds and rhonchi. Examination of the left-lower field reveals decreased breath sounds and rhonchi. Decreased breath sounds and rhonchi present. No wheezing or rales.  Abdominal:     Palpations: Abdomen is soft.     Tenderness: There is no abdominal tenderness.  Musculoskeletal:     Right lower leg: Swelling present.     Left  lower leg: Swelling present.     Comments: Both feet extended downward.  Skin:    General: Skin is warm.     Findings: No rash.  Neurological:     Mental Status: He is alert.      Condition at discharge: fair  The results of significant diagnostics from this hospitalization (including imaging, microbiology, ancillary and laboratory) are listed below for reference.   Imaging Studies: DG Swallowing Func-Speech Pathology Result Date: 04/24/2023 Table formatting from the original result was not included. Modified Barium Swallow Study Patient Details Name: MARZELL ISAKSON MRN: [id number] Date of Birth: [birth year] Today's Date: 04/24/2023 HPI/PMH: HPI: KEITH FELTEN is a 83 y.o. male with medical history significant of intellectual disability, hyperlipidemia, mental retardation, BPH, wheelchair dependent presenting with sepsis, acute respiratory failure with hypoxia, aspiration pneumonia, UTI, hypernatremia, Proteus bacteremia.  Limited history in the setting of electrical disability.  Patient noted to have been evaluated in the ER in December 27.  Was formally diagnosed with issues including UTI.  Was discharged back to group home on Keflex .  Unclear if patient has been receiving medication.  Patient with noted worsening lethargy at group home.  Noted temperature of 105 by EMS. CXR 04/22/23: New right perihilar opacity, which may be due to atelectasis or pneumonia. Pt with fever of 102.4 this date, WBC 9.3. Clinical Impression: Clinical Impression: Pt was awake throughout this study and while he had some vocalizations, meaning verbal communcation was not observed. Pt was not able to follow directions or participate in self-feeding.     Pt presents with high risk of aspiration when consuming nectar thick liquids via spoon and cup and when consuming puree.     Pt presents with severe oral phase dysphagia that is likely cognition based and is c/b oral holding of all administered boluses, lingual lingual  manipulation of boluses and rocking of all boluses d/t difficulty with AP propulsion. As a result, his oral phase is prolonged with ~ half of the bolus remaining in his mouth following each swallow. Pt is at increased risk of aspirating oral residue and he is likely to required extensive time to consume meals as maximal multimodal cues were not helpful in pt swallowing.     Pt also presents with moderate to severe pharyngeal phase dysphagia. Pt with initial silent aspiration of nectar thick liquid via spoon. The bolus spilled into his airway without any swallow initiation. However, when provided with cup sips, pt with  improved pharyngeal response and no further incidenes of aspiration were observed. Pt requires multiple swallows d/t oral phase deficits and boluses remaining in his oral cavity.     At this time, would recommend least restrictive diet of dysphagia 1 with nectar thick liquids via cup. Despite this modified diet, pt remains at a VERY HIGH RISK OF ASPIRATION d/t feeding dependency, current medical decline, baseline intellectual deficits and moderate oropharyngeal dysphagia. At this time, further ST services are not ikely to impact pt's abilities. Given severity of swallow impairment, would recommend discussion with Palliative Care for establishment of GOC. Factors that may increase risk of adverse event in presence of aspiration Noe & Lianne 2021): Factors that may increase risk of adverse event in presence of aspiration Noe & Lianne 2021): Poor general health and/or compromised immunity; Respiratory or GI disease; Reduced cognitive function; Limited mobility; Frail or deconditioned; Dependence for feeding and/or oral hygiene; Inadequate oral hygiene; Reduced saliva Recommendations/Plan: Swallowing Evaluation Recommendations Swallowing Evaluation Recommendations Recommendations: PO diet PO Diet Recommendation: Dysphagia 1 (Pureed); Mildly thick liquids (Level 2, nectar thick) Liquid  Administration via: Cup Medication Administration: Crushed with puree Supervision: Full assist for feeding; Full supervision/cueing for swallowing strategies Swallowing strategies  : Minimize environmental distractions; Slow rate; Small bites/sips Postural changes: Position pt fully upright for meals Oral care recommendations: Oral care before PO Caregiver Recommendations: Avoid jello, ice cream, thin soups, popsicles; Remove water  pitcher; Have oral suction available Treatment Plan Treatment Plan Treatment recommendations: No treatment recommended at this time Follow-up recommendations: No SLP follow up Functional status assessment: Patient has had a recent decline in their functional status and/or demonstrates limited ability to make significant improvements in function in a reasonable and predictable amount of time. Recommendations Recommendations for follow up therapy are one component of a multi-disciplinary discharge planning process, led by the attending physician.  Recommendations may be updated based on patient status, additional functional criteria and insurance authorization. Assessment: Orofacial Exam: Orofacial Exam Oral Cavity: Oral Hygiene: Xerostomia; Dried secretions Oral Cavity - Dentition: Edentulous Orofacial Anatomy: WFL Oral Motor/Sensory Function: WFL Anatomy: Anatomy: WFL Boluses Administered: Boluses Administered Boluses Administered: Mildly thick liquids (Level 2, nectar thick); Puree  Oral Impairment Domain: Oral Impairment Domain Lip Closure: Interlabial escape, no progression to anterior lip; Escape from interlabial space or lateral juncture, no extension beyond vermillion border; Escape progressing to mid-chin; Escape beyond mid-chin Tongue control during bolus hold: Not tested Bolus preparation/mastication: Minimal chewing/mashing with majority of bolus unchewed; Disorganized chewing/mashing with solid pieces of bolus unchewed Bolus transport/lingual motion: Repetitive/disorganized  tongue motion; Minimal-no tongue motion Oral residue: Majority of bolus remaining; Residue collection on oral structures Location of oral residue : Floor of mouth; Tongue; Palate; Lateral sulci Initiation of pharyngeal swallow : Pyriform sinuses; No visible initiation at any location; Posterior laryngeal surface of the epiglottis  Pharyngeal Impairment Domain: Pharyngeal Impairment Domain Soft palate elevation: No bolus between soft palate (SP)/pharyngeal wall (PW) Laryngeal elevation: Partial superior movement of thyroid  cartilage/partial approximation of arytenoids to epiglottic petiole; Complete superior movement of thyroid  cartilage with complete approximation of arytenoids to epiglottic petiole Anterior hyoid excursion: Partial anterior movement; Complete anterior movement Epiglottic movement: Partial inversion; No inversion; Complete inversion Laryngeal vestibule closure: None, wide column air/contrast in laryngeal vestibule; Incomplete, narrow column air/contrast in laryngeal vestibule; Complete, no air/contrast in laryngeal vestibule Pharyngeal stripping wave : Present - complete Pharyngoesophageal segment opening: Complete distension and complete duration, no obstruction of flow Tongue base retraction: Narrow column of contrast or air between tongue base  and PPW Pharyngeal residue: Trace residue within or on pharyngeal structures Location of pharyngeal residue: Tongue base; Valleculae; Pharyngeal wall; Pyriform sinuses  Esophageal Impairment Domain: Esophageal Impairment Domain Esophageal clearance upright position: Complete clearance, esophageal coating Pill: No data recorded Penetration/Aspiration Scale Score: Penetration/Aspiration Scale Score 8.  Material enters airway, passes BELOW cords without attempt by patient to eject out (silent aspiration) : Mildly thick liquids (Level 2, nectar thick) Compensatory Strategies: No data recorded  General Information: Caregiver present: No  Diet Prior to this  Study: NPO   Temperature : Normal   Respiratory Status: WFL   Supplemental O2: Nasal cannula   History of Recent Intubation: No  Behavior/Cognition: Alert; Confused; Distractible; Doesn't follow directions Self-Feeding Abilities: Dependent for feeding Baseline vocal quality/speech: Not observed Volitional Cough: Unable to elicit Volitional Swallow: Unable to elicit Exam Limitations: No limitations Goal Planning: Prognosis for improved oropharyngeal function: Guarded Barriers to Reach Goals: Cognitive deficits; Time post onset; Severity of deficits; Overall medical prognosis No data recorded Patient/Family Stated Goal: none stated Consulted and agree with results and recommendations: Pt unable/family or caregiver not available; Physician; Nurse Pain: Pain Assessment Breathing: 0 Negative Vocalization: 0 Facial Expression: 0 Body Language: 0 Consolability: 0 PAINAD Score: 0 End of Session: Start Time:SLP Start Time (ACUTE ONLY): 0945 Stop Time: SLP Stop Time (ACUTE ONLY): 1000 Time Calculation:SLP Time Calculation (min) (ACUTE ONLY): 15 min Charges: SLP Evaluations $ SLP Speech Visit: 1 Visit SLP Evaluations $BSS Swallow: 1 Procedure $MBS Swallow: 1 Procedure SLP visit diagnosis: SLP Visit Diagnosis: Dysphagia, oropharyngeal phase (R13.12) Past Medical History: Past Medical History: Diagnosis Date  Anxiety   Atrophy, disuse, muscle   BPH (benign prostatic hyperplasia)   Cerebral palsy (HCC)   Eczema   Elevated lipids   Elevated PSA   Exotropia   History of kidney stones   Hyperlipidemia   Kidney stone   Leukopenia   Lower extremity edema   lymphedema  Mental retardation   Over weight   Prostate cancer (HCC)   Venous insufficiency   Vitamin D  deficiency   Wheelchair dependence  Past Surgical History: Past Surgical History: Procedure Laterality Date  COLONOSCOPY WITH PROPOFOL  N/A 05/10/2020  Procedure: COLONOSCOPY WITH PROPOFOL ;  Surgeon: Unk Corinn Skiff, MD;  Location: [facility] ENDOSCOPY;  Service: Gastroenterology;   Laterality: N/A;  COLONOSCOPY WITH PROPOFOL  N/A 05/12/2020  Procedure: COLONOSCOPY WITH PROPOFOL ;  Surgeon: Unk Corinn Skiff, MD;  Location: [facility] ENDOSCOPY;  Service: Gastroenterology;  Laterality: N/A;  CYSTOSCOPY W/ RETROGRADES Left 10/04/2021  Procedure: CYSTOSCOPY WITH RETROGRADE PYELOGRAM;  Surgeon: Twylla Glendia BROCKS, MD;  Location: [facility] ORS;  Service: Urology;  Laterality: Left;  CYSTOSCOPY/URETEROSCOPY/HOLMIUM LASER/STENT PLACEMENT Right 08/14/2017  Procedure: CYSTOSCOPY/URETEROSCOPY/HOLMIUM LASER/STENT PLACEMENT;  Surgeon: Twylla Glendia BROCKS, MD;  Location: [facility] ORS;  Service: Urology;  Laterality: Right;  CYSTOSCOPY/URETEROSCOPY/HOLMIUM LASER/STENT PLACEMENT Left 10/04/2021  Procedure: CYSTOSCOPY/URETEROSCOPY/HOLMIUM LASER/STENT PLACEMENT/ LITHOPAXY;  Surgeon: Twylla Glendia BROCKS, MD;  Location: [facility] ORS;  Service: Urology;  Laterality: Left;  ESOPHAGOGASTRODUODENOSCOPY (EGD) WITH PROPOFOL  N/A 05/12/2020  Procedure: ESOPHAGOGASTRODUODENOSCOPY (EGD) WITH PROPOFOL ;  Surgeon: Unk Corinn Skiff, MD;  Location: [facility] ENDOSCOPY;  Service: Gastroenterology;  Laterality: N/A;  INCISION AND DRAINAGE ABSCESS Left 07/05/2021  Procedure: INCISION AND DRAINAGE SCROTAL WALL ABSCESS;  Surgeon: Twylla Glendia BROCKS, MD;  Location: [facility] ORS;  Service: Urology;  Laterality: Left;  leg circulation surgery Right   PROSTATE SURGERY  11/10/2008  BRACHYTHERAPY  SCROTAL EXPLORATION Left 07/05/2021  Procedure: SCROTUM EXPLORATION;  Surgeon: Twylla Glendia BROCKS, MD;  Location: [facility] ORS;  Service: Urology;  Laterality: Left; Happi Overton 04/24/2023, 12:23 PM  DG Chest Port 1 View Result Date: 04/22/2023 CLINICAL DATA:  Altered mental status.  Fever.  sepsis. EXAM: PORTABLE CHEST 1 VIEW COMPARISON:  04/20/2023 FINDINGS: The heart size and mediastinal contours are within normal limits. New opacity is seen in the right perihilar region, which may be due to atelectasis or pneumonia. Left lung is clear. No pleural effusion seen. IMPRESSION: New  right perihilar opacity, which may be due to atelectasis or pneumonia. Electronically Signed   By: Norleen DELENA Kil M.D.   On: 04/22/2023 10:38   DG Chest Port 1 View Result Date: 04/20/2023 CLINICAL DATA:  Weakness. EXAM: PORTABLE CHEST 1 VIEW COMPARISON:  Chest radiograph dated Sep 05, 2022. FINDINGS: The heart size and mediastinal contours are within normal limits. No focal consolidation, sizeable pleural effusion, or pneumothorax. Mild left basilar atelectasis. Diffuse osseous demineralization. No acute osseous abnormality. IMPRESSION: Mild left basilar atelectasis. Otherwise, no acute cardiopulmonary findings. Electronically Signed   By: Harrietta Sherry M.D.   On: 04/20/2023 10:50    Microbiology: Results for orders placed or performed during the hospital encounter of 04/22/23  Blood Culture (routine x 2)     Status: None   Collection Time: 04/22/23 10:02 AM   Specimen: BLOOD  Result Value Ref Range Status   Specimen Description BLOOD BLOOD RIGHT WRIST  Final   Special Requests   Final    BOTTLES DRAWN AEROBIC AND ANAEROBIC Blood Culture adequate volume   Culture   Final    NO GROWTH 5 DAYS Performed at [facility], [address] [place], [place] [address]    Report Status 04/27/2023 FINAL  Final  Blood Culture (routine x 2)     Status: None   Collection Time: 04/22/23 10:03 AM   Specimen: BLOOD  Result Value Ref Range Status   Specimen Description BLOOD RIGHT ANTECUBITAL  Final   Special Requests   Final    BOTTLES DRAWN AEROBIC AND ANAEROBIC Blood Culture adequate volume   Culture   Final    NO GROWTH 5 DAYS Performed at [facility], [address] [place], [place] [address]    Report Status 04/27/2023 FINAL  Final  Resp panel by RT-PCR (RSV, Flu A&B, Covid) Anterior Nasal Swab     Status: None   Collection Time: 04/22/23 10:18 AM   Specimen: Anterior Nasal Swab  Result Value Ref Range Status   SARS Coronavirus 2 by RT PCR NEGATIVE NEGATIVE Final     Comment: (NOTE) SARS-CoV-2 target nucleic acids are NOT DETECTED.  The SARS-CoV-2 RNA is generally detectable in upper respiratory specimens during the acute phase of infection. The lowest concentration of SARS-CoV-2 viral copies this assay can detect is 138 copies/mL. A negative result does not preclude SARS-Cov-2 infection and should not be used as the sole basis for treatment or other patient management decisions. A negative result may occur with  improper specimen collection/handling, submission of specimen other than nasopharyngeal swab, presence of viral mutation(s) within the areas targeted by this assay, and inadequate number of viral copies(<138 copies/mL). A negative result must be combined with clinical observations, patient history, and epidemiological information. The expected result is Negative.  Fact Sheet for Patients:  bloggercourse.com  Fact Sheet for Healthcare Providers:  seriousbroker.it  This test is no t yet approved or cleared by the [place]  FDA and  has been authorized for detection and/or diagnosis of SARS-CoV-2 by FDA under an Emergency Use Authorization (EUA). This EUA will  remain  in effect (meaning this test can be used) for the duration of the COVID-19 declaration under Section 564(b)(1) of the Act, 21 U.S.C.section 360bbb-3(b)(1), unless the authorization is terminated  or revoked sooner.       Influenza A by PCR NEGATIVE NEGATIVE Final   Influenza B by PCR NEGATIVE NEGATIVE Final    Comment: (NOTE) The Xpert Xpress SARS-CoV-2/FLU/RSV plus assay is intended as an aid in the diagnosis of influenza from Nasopharyngeal swab specimens and should not be used as a sole basis for treatment. Nasal washings and aspirates are unacceptable for Xpert Xpress SARS-CoV-2/FLU/RSV testing.  Fact Sheet for Patients: bloggercourse.com  Fact Sheet for Healthcare  Providers: seriousbroker.it  This test is not yet approved or cleared by the [place]  FDA and has been authorized for detection and/or diagnosis of SARS-CoV-2 by FDA under an Emergency Use Authorization (EUA). This EUA will remain in effect (meaning this test can be used) for the duration of the COVID-19 declaration under Section 564(b)(1) of the Act, 21 U.S.C. section 360bbb-3(b)(1), unless the authorization is terminated or revoked.     Resp Syncytial Virus by PCR NEGATIVE NEGATIVE Final    Comment: (NOTE) Fact Sheet for Patients: bloggercourse.com  Fact Sheet for Healthcare Providers: seriousbroker.it  This test is not yet approved or cleared by the [place]  FDA and has been authorized for detection and/or diagnosis of SARS-CoV-2 by FDA under an Emergency Use Authorization (EUA). This EUA will remain in effect (meaning this test can be used) for the duration of the COVID-19 declaration under Section 564(b)(1) of the Act, 21 U.S.C. section 360bbb-3(b)(1), unless the authorization is terminated or revoked.  Performed at [facility], [address] [place], [place] [address]   MRSA Next Gen by PCR, Nasal     Status: None   Collection Time: 04/22/23  1:46 PM   Specimen: Nasal Mucosa; Nasal Swab  Result Value Ref Range Status   MRSA by PCR Next Gen NOT DETECTED NOT DETECTED Final    Comment: (NOTE) The GeneXpert MRSA Assay (FDA approved for NASAL specimens only), is one component of a comprehensive MRSA colonization surveillance program. It is not intended to diagnose MRSA infection nor to guide or monitor treatment for MRSA infections. Test performance is not FDA approved in patients less than 59 years old. Performed at [facility], [address] [place], [place] [address]     Labs: CBC: Recent Labs  Lab 04/25/23 0321 04/26/23 0741 04/27/23 0446  04/30/23 0423  WBC 5.8  --  4.0 4.0  HGB 10.8* 11.2* 10.2* 10.8*  HCT 32.8*  --  29.7* 31.9*  MCV 103.8*  --  97.7 100.3*  PLT 102*  --  113* 158   Basic Metabolic Panel: Recent Labs  Lab 04/26/23 0741 04/27/23 0446 04/28/23 0415 04/29/23 0742 04/30/23 0423  NA 153* 149* 156* 150* 149*  K 3.0* 2.9* 3.7 3.5 3.8  CL 119* 117* 124* 118* 116*  CO2 23 24 25 27 25   GLUCOSE 74 99 98 92 76  BUN 20 19 21 20 17   CREATININE 0.63 0.62 0.65 0.60* 0.52*  CALCIUM  7.9* 7.8* 8.3* 8.1* 8.2*  MG  --   --  2.2  --   --   PHOS  --   --  2.6  --   --    Liver Function Tests: Recent Labs  Lab 04/25/23 0321  AST 21  ALT 14  ALKPHOS 58  BILITOT 0.7  PROT 5.5*  ALBUMIN  2.2*   CBG: No results for input(s): GLUCAP in the last 168 hours.  Discharge time spent: greater than 30 minutes.  Signed: Charlie Patterson, MD [facility] 05/01/2023

## 2023-05-01 NOTE — Progress Notes (Signed)
 Sports Coach Note  New referral received from Nichole Jobs RN [facility] for outpatient palliative care following discharge from [facility].  Spoke with patient's guardian and niece, [name] Stallion by phone to discuss outpatient palliative care services at [facility] where patient resides.  She is in agreement with outpatient palliative services with [facility].  Referral sent to referral intake.  Thank you for allowing participation in this patient's care.  Saddie HILARIO Na, RN Nurse Liaison [phone]

## 2023-05-01 NOTE — Telephone Encounter (Signed)
 Copied from CRM [id number]. Topic: Referral - Question >> May 01, 2023  2:34 PM Selinda RAMAN wrote: Reason for CRM: Sherrie with [facility] called to inform the provider they received a referral for Palliative Care and they will go ahead and proceed with scheduling for the patient. Please assist further

## 2023-05-01 NOTE — TOC Transition Note (Signed)
 Transition of Care [facility]) - Discharge Note   Patient Details  Name: [name] MRN: [id number] Date of Birth: [birth year]  Transition of Care [facility]) CM/SW Contact:  Tongela Encinas A Taria Castrillo, RN Phone Number: 05/01/2023, 4:07 PM   Clinical Narrative:    Chart reviewed.  Noted that patient has orders for discharge today.  I have spoken with Arland Breen, RN at [facility].  She reports that she did come and assess patient on yesterday evening.  She informs me that she will be able to accept patient back.  She has requested a Outpatient wound care consult.  I have informed her that [facility] has been consulted per Legal guardian's request to provide Palliative Care services on discharge at the facility.  I have made Kara with [facility] aware that patient would be a discharge for today.  I have left a voice message for Mrs. Evern to inform her that patient would be discharging to the facility today.   I have faxed patient demographic sheet, Discharge summary and Wound Care notes to [facility] wound center.  I have spoken with Dorothe.  Dorothe informs me that she will reach out to the facility and schedule and appointment.    Facility will transport patient to the facility today.    I have faxed Arland patient's Discharge Summary and Fl 2.    I have informed staff nurse to call report to Arland at [phone].      Final next level of care: Group Home Barriers to Discharge: No Barriers Identified   Patient Goals and CMS Choice   CMS Medicare.gov Compare Post Acute Care list provided to:: Patient Choice offered to / list presented to : Patient      Discharge Placement                Patient to be transferred to facility by: [facility] will transport patient today. Name of family member notified: [name] Evern( LVM) Patient and family notified of of transfer: 04/24/23  Discharge Plan and Services Additional  resources added to the After Visit Summary for                            [facility] Arranged:  (Outpatient wound care arranged at the [facility] wound care center.)          Social Drivers of Health (SDOH) Interventions SDOH Screenings   Food Insecurity: No Food Insecurity (04/24/2023)  Housing: Low Risk  (04/24/2023)  Transportation Needs: No Transportation Needs (04/24/2023)  Utilities: Not At Risk (04/24/2023)  Alcohol  Screen: Low Risk  (03/15/2023)  Depression (PHQ2-9): Low Risk  (03/15/2023)  Financial Resource Strain: Low Risk  (09/29/2022)  Physical Activity: Inactive (09/29/2022)  Social Connections: Patient Unable To Answer (04/24/2023)  Stress: No Stress Concern Present (09/29/2022)  Tobacco Use: Low Risk  (04/22/2023)     Readmission Risk Interventions     No data to display

## 2023-05-01 NOTE — NC FL2 (Signed)
 [name]  MEDICAID FL2 LEVEL OF CARE FORM     IDENTIFICATION  Patient Name: [name] Birthdate: 1940-10-05 Sex: male Admission Date (Current Location): 04/22/2023  [facility] and [place] Number:  [name] [id number] Facility and Address:  [facility], [address] [place], [place] [address]      Provider Number: [id number]  Attending Physician Name and Address:  Josette Ade, MD  Relative Name and Phone Number:  [name] Bloodgood [phone]    Current Level of Care: Hospital Recommended Level of Care:  (Group Home with Outpatient Pallative Care following) Prior Approval Number:    Date Approved/Denied:   PASRR Number:    Discharge Plan:  (Group Home)    Current Diagnoses: Patient Active Problem List   Diagnosis Date Noted   Thrush 04/30/2023   Sacral decubitus ulcer, stage II (HCC) 04/26/2023   Diarrhea 04/26/2023   Dysphagia 04/25/2023   Thrombocytopenia (HCC) 04/25/2023   Hypokalemia 04/25/2023   Macrocytic anemia 04/25/2023   Severe sepsis (HCC) 04/22/2023   Acute respiratory failure with hypoxia (HCC) 04/22/2023   Bacteremia due to Proteus species 04/22/2023   UTI (urinary tract infection) 04/22/2023   Hypernatremia 04/22/2023   Nephrolithiasis 02/01/2022   Pressure injury of left heel, stage 2 (HCC) 02/01/2022   Atherosclerosis of aorta (HCC) 02/01/2022   History of iron  deficiency anemia 02/01/2022   Urinary incontinence 05/26/2021   Hypotension 05/20/2021   History of rectal bleeding 05/09/2020   Hematochezia    BPH (benign prostatic hyperplasia)    Hyperlipidemia    Depression with anxiety    Personal history of urinary calculi 06/21/2019   PAD (peripheral artery disease) (HCC) 07/04/2017   Aortic valve calcification 07/04/2017   Coronary artery calcification 07/04/2017   Late effect acute polio 05/17/2016   Chronic constipation 11/19/2015   Mild major depression (HCC) 11/19/2015   Bacteriuria, chronic  09/11/2015   History of prostate cancer 03/02/2015   Amyotrophia 03/02/2015   Edema leg 03/02/2015   Cerebral palsy (HCC) 03/02/2015   Dyslipidemia 03/02/2015   Dermatitis, eczematoid 03/02/2015   Divergent squint 03/02/2015   H/O acute poliomyelitis 03/02/2015   Lymphedema 03/02/2015   Intellectual disability 03/02/2015   Hypertrophy of nail 03/02/2015   Chronic venous insufficiency 03/02/2015   Avitaminosis D 03/02/2015   Adynamia 03/02/2015   Dependent on wheelchair 03/02/2015   Renal cysts, acquired, bilateral 03/02/2015   At risk for falling 03/02/2015   Cerebral vascular disease 03/02/2015   Strabismus 02/11/2015    Orientation RESPIRATION BLADDER Height & Weight     Self, Time, Situation, Place  Normal Incontinent Weight:   Height:  5' 8 (172.7 cm) (stated by family)  BEHAVIORAL SYMPTOMS/MOOD NEUROLOGICAL BOWEL NUTRITION STATUS      Incontinent  (See Discharge Summary)  AMBULATORY STATUS COMMUNICATION OF NEEDS Skin   Extensive Assist Verbally PU Stage and Appropriate Care (Stage 2 See dc summary for wound care orders.)                       Personal Care Assistance Level of Assistance  Bathing, Feeding, Dressing Bathing Assistance: Maximum assistance Feeding assistance: Maximum assistance Dressing Assistance: Maximum assistance     Functional Limitations Info  Sight, Hearing, Speech Sight Info: Adequate Hearing Info: Adequate      SPECIAL CARE FACTORS FREQUENCY                       Contractures Contractures Info: Not present    Additional  Factors Info  Code Status, Allergies Code Status Info: DNR-Interven Allergies Info: No Known Allergies           Current Medications (05/01/2023):  This is the current hospital active medication list Current Facility-Administered Medications  Medication Dose Route Frequency Provider Last Rate Last Admin   acetaminophen  (TYLENOL ) suppository 650 mg  650 mg Rectal Q6H PRN Newton, Steven J, MD   650 mg  at 04/22/23 2212   acetaminophen  (TYLENOL ) tablet 650 mg  650 mg Oral Q6H PRN Newton, Steven J, MD       alfuzosin  (UROXATRAL ) 24 hr tablet 10 mg  10 mg Oral Daily Josette Ade, MD   10 mg at 05/01/23 9152   ALPRAZolam  (XANAX ) tablet 0.25 mg  0.25 mg Oral Daily PRN Josette Ade, MD       amoxicillin  (AMOXIL ) capsule 1,000 mg  1,000 mg Oral Q8H Wieting, Richard, MD   1,000 mg at 05/01/23 9371   atorvastatin  (LIPITOR) tablet 40 mg  40 mg Oral Daily Trudy Anthony HERO, MD   40 mg at 05/01/23 9152   fluconazole  (DIFLUCAN ) tablet 100 mg  100 mg Oral Daily Josette Ade, MD   100 mg at 05/01/23 0847   ipratropium-albuterol  (DUONEB) 0.5-2.5 (3) MG/3ML nebulizer solution 3 mL  3 mL Nebulization Q4H PRN Josette Ade, MD       ondansetron  (ZOFRAN ) tablet 4 mg  4 mg Oral Q6H PRN Eldonna Elspeth PARAS, MD       Or   ondansetron  (ZOFRAN ) injection 4 mg  4 mg Intravenous Q6H PRN Newton, Steven J, MD       pantoprazole  (PROTONIX ) EC tablet 40 mg  40 mg Oral Daily Josette Ade, MD   40 mg at 05/01/23 0847   sertraline  (ZOLOFT ) tablet 100 mg  100 mg Oral Daily Trudy Anthony HERO, MD   100 mg at 05/01/23 9152     Discharge Medications: Please see discharge summary for a list of discharge medications. TAKE these medications     acetaminophen  325 MG tablet Commonly known as: TYLENOL  TAKE 2 TABLETS (650 MG TOTAL) BY MOUTH EVERY 6 HOURS AS NEEDED FOR MILD PAIN OR HEADACHE.    alfuzosin  10 MG 24 hr tablet Commonly known as: UROXATRAL  Take 1 tablet (10 mg total) by mouth daily.    ALPRAZolam  0.25 MG tablet Commonly known as: XANAX  Take 1 tablet (0.25 mg total) by mouth daily as needed (agitation (crisis med)).    amoxicillin  500 MG capsule Commonly known as: AMOXIL  Take 2 capsules (1,000 mg total) by mouth every 8 (eight) hours for 3 doses.    atorvastatin  40 MG tablet Commonly known as: LIPITOR Take 1 tablet (40 mg total) by mouth every evening.    augmented betamethasone   dipropionate 0.05 % cream Commonly known as: DIPROLENE -AF APPLY TOPICALLY AS DIRECTED TO RASH AS NEEDED    benzonatate  100 MG capsule Commonly known as: TESSALON  Take 2 capsules (200 mg total) by mouth 2 (two) times daily as needed for cough.    Calcium  + Vitamin D3 600-10 MG-MCG Tabs Generic drug: Calcium  Carb-Cholecalciferol  TAKE 1 TABLET BY MOUTH TWICE DAILY.    Depend Silhouette Briefs L/XL Misc 1 each by Does not apply route 2 (two) times daily as needed.    fluconazole  100 MG tablet Commonly known as: DIFLUCAN  Take 1 tablet (100 mg total) by mouth daily. Start taking on: May 02, 2023    fluticasone  50 MCG/ACT nasal spray Commonly known as: FLONASE  Place 2 sprays into both nostrils  daily.    GNP Vitamin C  500 MG tablet Generic drug: ascorbic acid  TAKE 1 TABLET BY MOUTH ONCE DAILY    guaiFENesin  600 MG 12 hr tablet Commonly known as: Mucinex  Take 1 tablet (600 mg total) by mouth 2 (two) times daily as needed for cough.    nystatin  cream Commonly known as: MYCOSTATIN  APPLY 1 APPLICATION TOPICALLY 2 TIMES DAILY    pantoprazole  40 MG tablet Commonly known as: PROTONIX  Take 1 tablet (40 mg total) by mouth daily. Start taking on: May 02, 2023    sertraline  100 MG tablet Commonly known as: ZOLOFT  Take 1 tablet (100 mg total) by mouth daily.    SYSTANE ULTRA OP Place 1 drop into both eyes 2 (two) times daily as needed (dry eyes).         Relevant Imaging Results:  Relevant Lab Results:   Additional Information [id number]  Cris Gibby A Kino Dunsworth, RN

## 2023-05-07 ENCOUNTER — Encounter: Payer: Self-pay | Admitting: Physician Assistant

## 2023-05-07 ENCOUNTER — Ambulatory Visit (INDEPENDENT_AMBULATORY_CARE_PROVIDER_SITE_OTHER): Payer: 59 | Admitting: Physician Assistant

## 2023-05-07 VITALS — BP 118/76 | HR 76 | Resp 16 | Ht 68.0 in | Wt 156.0 lb

## 2023-05-07 DIAGNOSIS — Z130 Encounter for screening for diseases of the blood and blood-forming organs and certain disorders involving the immune mechanism: Secondary | ICD-10-CM | POA: Diagnosis not present

## 2023-05-07 DIAGNOSIS — Z09 Encounter for follow-up examination after completed treatment for conditions other than malignant neoplasm: Secondary | ICD-10-CM | POA: Diagnosis not present

## 2023-05-07 NOTE — Progress Notes (Signed)
 Acute Office Visit   Patient: [name]   DOB: Oct 12, 1940   83 y.o. Male  MRN: [id number] Visit Date: 05/07/2023  Today's healthcare provider: Rocky BRAVO Birttany Dechellis, PA-C  Introduced myself to the patient as a PA-C and provided education on APPs in clinical practice.    Chief Complaint  Patient presents with   Hospitalization Follow-up    (9 days) Admitted: 04/22/23, Discharged: 05/01/23   Subjective    HPI HPI     Hospitalization Follow-up    Additional comments: (9 days) Admitted: 04/22/23, Discharged: 05/01/23      Last edited by Dann Kirsch, CMA on 05/07/2023  1:35 PM.      Patient is here with caregivers from group home-   His caregivers report he has largely returned to normal since hospitalization  Transition of Care Hospital Follow up.   Hospital/Facility: [facility] D/C Physician: Josette Ade, MD  D/C Date: 05/01/23   Diagnoses on Discharge:  Principal Problem:   Severe sepsis (HCC) Active Problems:   Acute respiratory failure with hypoxia (HCC)   Hypernatremia   Thrombocytopenia (HCC)   Hypokalemia   UTI (urinary tract infection)   Intellectual disability   Dysphagia   Dyslipidemia   Macrocytic anemia   Bacteremia due to Proteus species   Sacral decubitus ulcer, stage II (HCC)   Diarrhea   Thrush  Date of interactive Contact within 48 hours of discharge: patient was not contacted for [facility] phone call  Contact was through: direct  Date of 7 day or 14 day face-to-face visit:    within 7 days  Outpatient Encounter Medications as of 05/07/2023  Medication Sig Note   acetaminophen  (TYLENOL ) 325 MG tablet TAKE 2 TABLETS (650 MG TOTAL) BY MOUTH EVERY 6 HOURS AS NEEDED FOR MILD PAIN OR HEADACHE. 04/22/2023: prn   alfuzosin  (UROXATRAL ) 10 MG 24 hr tablet Take 1 tablet (10 mg total) by mouth daily.    ALPRAZolam  (XANAX ) 0.25 MG tablet Take 1 tablet (0.25 mg total) by mouth daily as needed (agitation (crisis med)).    atorvastatin  (LIPITOR) 40 MG  tablet Take 1 tablet (40 mg total) by mouth every evening.    augmented betamethasone  dipropionate (DIPROLENE -AF) 0.05 % cream APPLY TOPICALLY AS DIRECTED TO RASH AS NEEDED 04/22/2023: prn   benzonatate  (TESSALON ) 100 MG capsule Take 2 capsules (200 mg total) by mouth 2 (two) times daily as needed for cough. 04/22/2023: prn   Calcium  Carb-Cholecalciferol  (CALCIUM  + VITAMIN D3) 600-10 MG-MCG TABS TAKE 1 TABLET BY MOUTH TWICE DAILY.    fluconazole  (DIFLUCAN ) 100 MG tablet Take 1 tablet (100 mg total) by mouth daily.    fluticasone  (FLONASE ) 50 MCG/ACT nasal spray Place 2 sprays into both nostrils daily.    food thickener (RESOURCE THICKENUP CLEAR) POWD Take 120 g by mouth as needed.    GNP VITAMIN C  500 MG tablet TAKE 1 TABLET BY MOUTH ONCE DAILY    guaiFENesin  (MUCINEX ) 600 MG 12 hr tablet Take 1 tablet (600 mg total) by mouth 2 (two) times daily as needed for cough.    Incontinence Supply Disposable (DEPEND SILHOUETTE BRIEFS L/XL) MISC 1 each by Does not apply route 2 (two) times daily as needed.    Nutritional Supplements (ENSURE ORIGINAL) LIQD DRINK 1 BOTTLE BY MOUTH 3 TIMES DAILY    nystatin  cream (MYCOSTATIN ) APPLY 1 APPLICATION TOPICALLY 2 TIMES DAILY    pantoprazole  (PROTONIX ) 40 MG tablet Take 1 tablet (40 mg total) by mouth daily.  Polyethyl Glycol-Propyl Glycol (SYSTANE ULTRA OP) Place 1 drop into both eyes 2 (two) times daily as needed (dry eyes). 04/22/2023: prn   sertraline  (ZOLOFT ) 100 MG tablet Take 1 tablet (100 mg total) by mouth daily.    No facility-administered encounter medications on file as of 05/07/2023.    Diagnostic Tests Reviewed/Disposition: blood work, swallowing test (barium swallow study). Urine testing, CXR  Consults: none   Discharge Instructions: follow Dysphagia 1 diet with nectar thick liquids   Disease/illness Education: NA  Home Health/Community Services Discussions/Referrals: Wound care for sacral wound - apt scheduled for 05/28/23  Establishment or  re-establishment of referral orders for community resources: NA  Discussion with other health care providers: none   Assessment and Support of treatment regimen adherence: he has been taking Diflucan  for thrush. He completed Amoxicillin  as directed on discharge. They are keeping sacral wound with cleanser and keeping covered with steri pad and ointment. They are turning him every 2 hours to help with pressure redistribution while he has down time   Appointments Coordinated with: Has apt scheduled with Wound Care   Education for self-management, independent living, and ADLs: he has returned to group home and they are continuing to follow recommendations from discharge. Still on Nectar thick liquids    Medications: Outpatient Medications Prior to Visit  Medication Sig   acetaminophen  (TYLENOL ) 325 MG tablet TAKE 2 TABLETS (650 MG TOTAL) BY MOUTH EVERY 6 HOURS AS NEEDED FOR MILD PAIN OR HEADACHE.   alfuzosin  (UROXATRAL ) 10 MG 24 hr tablet Take 1 tablet (10 mg total) by mouth daily.   ALPRAZolam  (XANAX ) 0.25 MG tablet Take 1 tablet (0.25 mg total) by mouth daily as needed (agitation (crisis med)).   atorvastatin  (LIPITOR) 40 MG tablet Take 1 tablet (40 mg total) by mouth every evening.   augmented betamethasone  dipropionate (DIPROLENE -AF) 0.05 % cream APPLY TOPICALLY AS DIRECTED TO RASH AS NEEDED   benzonatate  (TESSALON ) 100 MG capsule Take 2 capsules (200 mg total) by mouth 2 (two) times daily as needed for cough.   Calcium  Carb-Cholecalciferol  (CALCIUM  + VITAMIN D3) 600-10 MG-MCG TABS TAKE 1 TABLET BY MOUTH TWICE DAILY.   fluconazole  (DIFLUCAN ) 100 MG tablet Take 1 tablet (100 mg total) by mouth daily.   fluticasone  (FLONASE ) 50 MCG/ACT nasal spray Place 2 sprays into both nostrils daily.   food thickener (RESOURCE THICKENUP CLEAR) POWD Take 120 g by mouth as needed.   GNP VITAMIN C  500 MG tablet TAKE 1 TABLET BY MOUTH ONCE DAILY   guaiFENesin  (MUCINEX ) 600 MG 12 hr tablet Take 1 tablet (600  mg total) by mouth 2 (two) times daily as needed for cough.   Incontinence Supply Disposable (DEPEND SILHOUETTE BRIEFS L/XL) MISC 1 each by Does not apply route 2 (two) times daily as needed.   Nutritional Supplements (ENSURE ORIGINAL) LIQD DRINK 1 BOTTLE BY MOUTH 3 TIMES DAILY   nystatin  cream (MYCOSTATIN ) APPLY 1 APPLICATION TOPICALLY 2 TIMES DAILY   pantoprazole  (PROTONIX ) 40 MG tablet Take 1 tablet (40 mg total) by mouth daily.   Polyethyl Glycol-Propyl Glycol (SYSTANE ULTRA OP) Place 1 drop into both eyes 2 (two) times daily as needed (dry eyes).   sertraline  (ZOLOFT ) 100 MG tablet Take 1 tablet (100 mg total) by mouth daily.   No facility-administered medications prior to visit.    Review of Systems  Constitutional:  Negative for chills and fever.  Respiratory:  Negative for shortness of breath and wheezing.   Genitourinary:  Negative for dysuria.  Psychiatric/Behavioral:  Negative for agitation, confusion and dysphoric mood. The patient is not nervous/anxious.         Objective    BP 118/76   Pulse 76   Resp 16   Ht 5' 8 (1.727 m)   Wt 156 lb (70.8 kg)   SpO2 97%   BMI 23.72 kg/m     Physical Exam Vitals reviewed.  Constitutional:      General: He is awake.     Appearance: Normal appearance. He is well-groomed.  HENT:     Head: Normocephalic and atraumatic.  Cardiovascular:     Rate and Rhythm: Normal rate and regular rhythm.     Pulses:          Carotid pulses are 2+ on the right side and 2+ on the left side.    Heart sounds: Heart sounds are distant.  Pulmonary:     Effort: Pulmonary effort is normal.     Breath sounds: Normal breath sounds. No decreased air movement. No wheezing, rhonchi or rales.  Musculoskeletal:     Right lower leg: No edema.     Left lower leg: No edema.  Skin:    General: Skin is warm and dry.  Neurological:     General: No focal deficit present.     Mental Status: He is alert.  Psychiatric:        Attention and Perception: He  is inattentive.        Mood and Affect: Mood normal.        Speech: Speech is slurred.        Behavior: Behavior is cooperative.        Cognition and Memory: Cognition is impaired.       No results found for any visits on 05/07/23.  Assessment & Plan      No follow-ups on file.      Problem List Items Addressed This Visit   None Visit Diagnoses       Hospital discharge follow-up    -  Primary   Relevant Orders   CBC w/Diff/Platelet   COMPLETE METABOLIC PANEL WITH GFR     Patient was hospitalized from 04/24/23 to 05/01/23 for sepsis, acute respiratory failure, UTI and electrolyte derangement that appears to be improving at this time He has scheduled follow up with Wound care for Sacral ulcer -caregivers report they are following cleaning and dressing instructions as well as moving him every few hours to prevent progression Physical exam is overall reassuring for continued recovery and caregivers report he is close to baseline  Continue nectar thick liquids and wound care protocols. Will check CMP for electrolytes and kidney function. Will check CBC for anemia monitoring Results to dictate further management  Reviewed ED and return precautions Follow up as needed for persistent or progressing symptoms     No follow-ups on file.   I, Saamir Armstrong E Davin Archuletta, PA-C, have reviewed all documentation for this visit. The documentation on 05/07/23 for the exam, diagnosis, procedures, and orders are all accurate and complete.   [place], MHS, PA-C [facility]

## 2023-05-08 DIAGNOSIS — Z515 Encounter for palliative care: Secondary | ICD-10-CM | POA: Diagnosis not present

## 2023-05-08 LAB — COMPLETE METABOLIC PANEL WITH GFR
AG Ratio: 1.1 (calc) (ref 1.0–2.5)
ALT: 20 U/L (ref 9–46)
AST: 16 U/L (ref 10–35)
Albumin: 3.2 g/dL — ABNORMAL LOW (ref 3.6–5.1)
Alkaline phosphatase (APISO): 135 U/L (ref 35–144)
BUN/Creatinine Ratio: 41 (calc) — ABNORMAL HIGH (ref 6–22)
BUN: 26 mg/dL — ABNORMAL HIGH (ref 7–25)
CO2: 30 mmol/L (ref 20–32)
Calcium: 9.1 mg/dL (ref 8.6–10.3)
Chloride: 105 mmol/L (ref 98–110)
Creat: 0.63 mg/dL — ABNORMAL LOW (ref 0.70–1.22)
Globulin: 3 g/dL (ref 1.9–3.7)
Glucose, Bld: 80 mg/dL (ref 65–99)
Potassium: 4.6 mmol/L (ref 3.5–5.3)
Sodium: 142 mmol/L (ref 135–146)
Total Bilirubin: 0.3 mg/dL (ref 0.2–1.2)
Total Protein: 6.2 g/dL (ref 6.1–8.1)
eGFR: 96 mL/min/{1.73_m2} (ref >=60)

## 2023-05-08 LAB — CBC WITH DIFFERENTIAL/PLATELET
Absolute Lymphocytes: 1198 {cells}/uL (ref 850–3900)
Absolute Monocytes: 443 {cells}/uL (ref 200–950)
Basophils Absolute: 18 {cells}/uL (ref 0–200)
Basophils Relative: 0.3 %
Eosinophils Absolute: 89 {cells}/uL (ref 15–500)
Eosinophils Relative: 1.5 %
HCT: 32.7 % — ABNORMAL LOW (ref 38.5–50.0)
Hemoglobin: 11.3 g/dL — ABNORMAL LOW (ref 13.2–17.1)
MCH: 34 pg — ABNORMAL HIGH (ref 27.0–33.0)
MCHC: 34.6 g/dL (ref 32.0–36.0)
MCV: 98.5 fL (ref 80.0–100.0)
MPV: 11.7 fL (ref 7.5–12.5)
Monocytes Relative: 7.5 %
Neutro Abs: 4154 {cells}/uL (ref 1500–7800)
Neutrophils Relative %: 70.4 %
Platelets: 181 10*3/uL (ref 140–400)
RBC: 3.32 10*6/uL — ABNORMAL LOW (ref 4.20–5.80)
RDW: 12.3 % (ref 11.0–15.0)
Total Lymphocyte: 20.3 %
WBC: 5.9 10*3/uL (ref 3.8–10.8)

## 2023-05-10 ENCOUNTER — Telehealth: Payer: Self-pay | Admitting: Oncology

## 2023-05-10 NOTE — Telephone Encounter (Signed)
Patients nurse asked for him to have a follow up due to some lab results- Please call Marigene Ehlers [phone] with appointments- Last wrap up- 04/10/22- no follow up needed-  patient is a resident with [name].

## 2023-05-10 NOTE — Telephone Encounter (Signed)
Please have the nurse fax [place] the labs so we can decide about follow up accordingly

## 2023-05-11 ENCOUNTER — Encounter: Payer: Self-pay | Admitting: Physician Assistant

## 2023-05-11 NOTE — Progress Notes (Signed)
Your labs are back Your CBC shows mild anemia that appears to be improving since it was last checked while you were in the hospital. Your CMP was overall normal in regards to electrolytes. Your protein and creatinine were a bit low but this is to be expected with a recent hospital stay and while recovering. Your kidney and liver function are in normal ranges.

## 2023-05-17 ENCOUNTER — Other Ambulatory Visit: Payer: Self-pay | Admitting: Family Medicine

## 2023-05-17 DIAGNOSIS — Z515 Encounter for palliative care: Secondary | ICD-10-CM | POA: Diagnosis not present

## 2023-05-17 NOTE — Telephone Encounter (Signed)
Prescriber not at this office

## 2023-05-20 ENCOUNTER — Emergency Department: Payer: 59

## 2023-05-20 ENCOUNTER — Other Ambulatory Visit: Payer: Self-pay

## 2023-05-20 DIAGNOSIS — E876 Hypokalemia: Secondary | ICD-10-CM | POA: Diagnosis not present

## 2023-05-20 DIAGNOSIS — Z79899 Other long term (current) drug therapy: Secondary | ICD-10-CM

## 2023-05-20 DIAGNOSIS — N281 Cyst of kidney, acquired: Secondary | ICD-10-CM | POA: Diagnosis not present

## 2023-05-20 DIAGNOSIS — R6889 Other general symptoms and signs: Secondary | ICD-10-CM | POA: Diagnosis not present

## 2023-05-20 DIAGNOSIS — L89322 Pressure ulcer of left buttock, stage 2: Secondary | ICD-10-CM | POA: Diagnosis present

## 2023-05-20 DIAGNOSIS — I2510 Atherosclerotic heart disease of native coronary artery without angina pectoris: Secondary | ICD-10-CM | POA: Diagnosis not present

## 2023-05-20 DIAGNOSIS — J9811 Atelectasis: Secondary | ICD-10-CM | POA: Diagnosis not present

## 2023-05-20 DIAGNOSIS — Z8249 Family history of ischemic heart disease and other diseases of the circulatory system: Secondary | ICD-10-CM

## 2023-05-20 DIAGNOSIS — E785 Hyperlipidemia, unspecified: Secondary | ICD-10-CM | POA: Diagnosis present

## 2023-05-20 DIAGNOSIS — G809 Cerebral palsy, unspecified: Secondary | ICD-10-CM | POA: Diagnosis present

## 2023-05-20 DIAGNOSIS — A4159 Other Gram-negative sepsis: Principal | ICD-10-CM | POA: Diagnosis present

## 2023-05-20 DIAGNOSIS — G9340 Encephalopathy, unspecified: Secondary | ICD-10-CM | POA: Diagnosis present

## 2023-05-20 DIAGNOSIS — N390 Urinary tract infection, site not specified: Secondary | ICD-10-CM | POA: Diagnosis not present

## 2023-05-20 DIAGNOSIS — R6520 Severe sepsis without septic shock: Secondary | ICD-10-CM | POA: Diagnosis present

## 2023-05-20 DIAGNOSIS — R404 Transient alteration of awareness: Secondary | ICD-10-CM | POA: Diagnosis not present

## 2023-05-20 DIAGNOSIS — R509 Fever, unspecified: Secondary | ICD-10-CM | POA: Diagnosis not present

## 2023-05-20 DIAGNOSIS — R4182 Altered mental status, unspecified: Secondary | ICD-10-CM | POA: Diagnosis not present

## 2023-05-20 DIAGNOSIS — Z743 Need for continuous supervision: Secondary | ICD-10-CM | POA: Diagnosis not present

## 2023-05-20 DIAGNOSIS — G9341 Metabolic encephalopathy: Secondary | ICD-10-CM | POA: Diagnosis not present

## 2023-05-20 DIAGNOSIS — F419 Anxiety disorder, unspecified: Secondary | ICD-10-CM | POA: Diagnosis present

## 2023-05-20 DIAGNOSIS — I872 Venous insufficiency (chronic) (peripheral): Secondary | ICD-10-CM | POA: Diagnosis present

## 2023-05-20 DIAGNOSIS — R410 Disorientation, unspecified: Secondary | ICD-10-CM | POA: Diagnosis not present

## 2023-05-20 DIAGNOSIS — Z8546 Personal history of malignant neoplasm of prostate: Secondary | ICD-10-CM

## 2023-05-20 DIAGNOSIS — E861 Hypovolemia: Secondary | ICD-10-CM | POA: Diagnosis not present

## 2023-05-20 DIAGNOSIS — D539 Nutritional anemia, unspecified: Secondary | ICD-10-CM | POA: Diagnosis present

## 2023-05-20 DIAGNOSIS — Z66 Do not resuscitate: Secondary | ICD-10-CM | POA: Diagnosis present

## 2023-05-20 DIAGNOSIS — Z993 Dependence on wheelchair: Secondary | ICD-10-CM

## 2023-05-20 DIAGNOSIS — N200 Calculus of kidney: Secondary | ICD-10-CM | POA: Diagnosis not present

## 2023-05-20 DIAGNOSIS — A4150 Gram-negative sepsis, unspecified: Secondary | ICD-10-CM | POA: Diagnosis not present

## 2023-05-20 DIAGNOSIS — E559 Vitamin D deficiency, unspecified: Secondary | ICD-10-CM | POA: Diagnosis not present

## 2023-05-20 DIAGNOSIS — E870 Hyperosmolality and hypernatremia: Secondary | ICD-10-CM | POA: Diagnosis present

## 2023-05-20 DIAGNOSIS — N400 Enlarged prostate without lower urinary tract symptoms: Secondary | ICD-10-CM | POA: Diagnosis present

## 2023-05-20 DIAGNOSIS — R0989 Other specified symptoms and signs involving the circulatory and respiratory systems: Secondary | ICD-10-CM | POA: Diagnosis not present

## 2023-05-20 DIAGNOSIS — Z87440 Personal history of urinary (tract) infections: Secondary | ICD-10-CM

## 2023-05-20 DIAGNOSIS — R531 Weakness: Secondary | ICD-10-CM | POA: Diagnosis not present

## 2023-05-20 DIAGNOSIS — F79 Unspecified intellectual disabilities: Secondary | ICD-10-CM | POA: Diagnosis present

## 2023-05-20 DIAGNOSIS — R0602 Shortness of breath: Secondary | ICD-10-CM | POA: Diagnosis not present

## 2023-05-20 DIAGNOSIS — Z1152 Encounter for screening for COVID-19: Secondary | ICD-10-CM

## 2023-05-20 LAB — PROTIME-INR
INR: 1.1 (ref 0.8–1.2)
Prothrombin Time: 14.3 s (ref 11.4–15.2)

## 2023-05-20 LAB — CBC WITH DIFFERENTIAL/PLATELET
Abs Immature Granulocytes: 0.08 10*3/uL — ABNORMAL HIGH (ref 0.00–0.07)
Basophils Absolute: 0 10*3/uL (ref 0.0–0.1)
Basophils Relative: 0 %
Eosinophils Absolute: 0 10*3/uL (ref 0.0–0.5)
Eosinophils Relative: 0 %
HCT: 38.1 % — ABNORMAL LOW (ref 39.0–52.0)
Hemoglobin: 12.7 g/dL — ABNORMAL LOW (ref 13.0–17.0)
Immature Granulocytes: 1 %
Lymphocytes Relative: 3 %
Lymphs Abs: 0.5 10*3/uL — ABNORMAL LOW (ref 0.7–4.0)
MCH: 33.8 pg (ref 26.0–34.0)
MCHC: 33.3 g/dL (ref 30.0–36.0)
MCV: 101.3 fL — ABNORMAL HIGH (ref 80.0–100.0)
Monocytes Absolute: 0.7 10*3/uL (ref 0.1–1.0)
Monocytes Relative: 5 %
Neutro Abs: 14.8 10*3/uL — ABNORMAL HIGH (ref 1.7–7.7)
Neutrophils Relative %: 91 %
Platelets: 102 10*3/uL — ABNORMAL LOW (ref 150–400)
RBC: 3.76 MIL/uL — ABNORMAL LOW (ref 4.22–5.81)
RDW: 14.5 % (ref 11.5–15.5)
WBC: 16.2 10*3/uL — ABNORMAL HIGH (ref 4.0–10.5)
nRBC: 0 % (ref 0.0–0.2)

## 2023-05-20 LAB — COMPREHENSIVE METABOLIC PANEL
ALT: 16 U/L (ref 0–44)
AST: 19 U/L (ref 15–41)
Albumin: 3.2 g/dL — ABNORMAL LOW (ref 3.5–5.0)
Alkaline Phosphatase: 105 U/L (ref 38–126)
Anion gap: 12 (ref 5–15)
BUN: 35 mg/dL — ABNORMAL HIGH (ref 8–23)
CO2: 22 mmol/L (ref 22–32)
Calcium: 9.1 mg/dL (ref 8.9–10.3)
Chloride: 108 mmol/L (ref 98–111)
Creatinine, Ser: 0.7 mg/dL (ref 0.61–1.24)
GFR, Estimated: >60 mL/min (ref >60)
Glucose, Bld: 122 mg/dL — ABNORMAL HIGH (ref 70–99)
Potassium: 4.1 mmol/L (ref 3.5–5.1)
Sodium: 142 mmol/L (ref 135–145)
Total Bilirubin: 0.7 mg/dL (ref 0.0–1.2)
Total Protein: 7.2 g/dL (ref 6.5–8.1)

## 2023-05-20 LAB — LACTIC ACID, PLASMA: Lactic Acid, Venous: 1.4 mmol/L (ref 0.5–1.9)

## 2023-05-20 NOTE — ED Triage Notes (Addendum)
First nurse note:   Pt presents via EMS from [facility] c/o possible UTI and fever.   Pt from [address] [place] [address] per EMS report.   EMS reports pt has strong UTI smell and has an axillary temp of 102.  Reports was admitted x2 weeks ago for sepsis.  DNR at bedside.

## 2023-05-21 ENCOUNTER — Inpatient Hospital Stay
Admission: EM | Admit: 2023-05-21 | Discharge: 2023-05-25 | DRG: 871 | Disposition: A | Discharge status: Hospice | Payer: 59 | Attending: Student in an Organized Health Care Education/Training Program | Admitting: Student in an Organized Health Care Education/Training Program

## 2023-05-21 ENCOUNTER — Inpatient Hospital Stay: Payer: 59

## 2023-05-21 DIAGNOSIS — Z8546 Personal history of malignant neoplasm of prostate: Secondary | ICD-10-CM | POA: Diagnosis not present

## 2023-05-21 DIAGNOSIS — E785 Hyperlipidemia, unspecified: Secondary | ICD-10-CM | POA: Diagnosis present

## 2023-05-21 DIAGNOSIS — Z87440 Personal history of urinary (tract) infections: Secondary | ICD-10-CM | POA: Diagnosis not present

## 2023-05-21 DIAGNOSIS — F419 Anxiety disorder, unspecified: Secondary | ICD-10-CM | POA: Diagnosis present

## 2023-05-21 DIAGNOSIS — N390 Urinary tract infection, site not specified: Secondary | ICD-10-CM | POA: Diagnosis present

## 2023-05-21 DIAGNOSIS — N281 Cyst of kidney, acquired: Secondary | ICD-10-CM | POA: Diagnosis present

## 2023-05-21 DIAGNOSIS — A4150 Gram-negative sepsis, unspecified: Secondary | ICD-10-CM | POA: Diagnosis not present

## 2023-05-21 DIAGNOSIS — Z79899 Other long term (current) drug therapy: Secondary | ICD-10-CM | POA: Diagnosis not present

## 2023-05-21 DIAGNOSIS — G9340 Encephalopathy, unspecified: Secondary | ICD-10-CM | POA: Diagnosis present

## 2023-05-21 DIAGNOSIS — A4159 Other Gram-negative sepsis: Secondary | ICD-10-CM | POA: Diagnosis present

## 2023-05-21 DIAGNOSIS — G809 Cerebral palsy, unspecified: Secondary | ICD-10-CM | POA: Diagnosis present

## 2023-05-21 DIAGNOSIS — N400 Enlarged prostate without lower urinary tract symptoms: Secondary | ICD-10-CM | POA: Diagnosis present

## 2023-05-21 DIAGNOSIS — L8990 Pressure ulcer of unspecified site, unspecified stage: Secondary | ICD-10-CM | POA: Insufficient documentation

## 2023-05-21 DIAGNOSIS — E870 Hyperosmolality and hypernatremia: Secondary | ICD-10-CM | POA: Diagnosis present

## 2023-05-21 DIAGNOSIS — F79 Unspecified intellectual disabilities: Secondary | ICD-10-CM | POA: Diagnosis present

## 2023-05-21 DIAGNOSIS — R4182 Altered mental status, unspecified: Secondary | ICD-10-CM

## 2023-05-21 DIAGNOSIS — E876 Hypokalemia: Secondary | ICD-10-CM | POA: Diagnosis not present

## 2023-05-21 DIAGNOSIS — N200 Calculus of kidney: Secondary | ICD-10-CM | POA: Diagnosis present

## 2023-05-21 DIAGNOSIS — E559 Vitamin D deficiency, unspecified: Secondary | ICD-10-CM | POA: Diagnosis present

## 2023-05-21 DIAGNOSIS — I872 Venous insufficiency (chronic) (peripheral): Secondary | ICD-10-CM | POA: Diagnosis present

## 2023-05-21 DIAGNOSIS — L89322 Pressure ulcer of left buttock, stage 2: Secondary | ICD-10-CM | POA: Diagnosis present

## 2023-05-21 DIAGNOSIS — Z1152 Encounter for screening for COVID-19: Secondary | ICD-10-CM | POA: Diagnosis not present

## 2023-05-21 DIAGNOSIS — I2510 Atherosclerotic heart disease of native coronary artery without angina pectoris: Secondary | ICD-10-CM | POA: Diagnosis present

## 2023-05-21 DIAGNOSIS — G9341 Metabolic encephalopathy: Secondary | ICD-10-CM | POA: Diagnosis present

## 2023-05-21 DIAGNOSIS — E861 Hypovolemia: Secondary | ICD-10-CM | POA: Diagnosis not present

## 2023-05-21 DIAGNOSIS — R509 Fever, unspecified: Principal | ICD-10-CM

## 2023-05-21 DIAGNOSIS — Z66 Do not resuscitate: Secondary | ICD-10-CM | POA: Diagnosis present

## 2023-05-21 DIAGNOSIS — D539 Nutritional anemia, unspecified: Secondary | ICD-10-CM | POA: Diagnosis present

## 2023-05-21 DIAGNOSIS — R6520 Severe sepsis without septic shock: Secondary | ICD-10-CM | POA: Diagnosis present

## 2023-05-21 LAB — URINALYSIS, W/ REFLEX TO CULTURE (INFECTION SUSPECTED)
Bilirubin Urine: NEGATIVE
Glucose, UA: NEGATIVE mg/dL
Ketones, ur: NEGATIVE mg/dL
Nitrite: NEGATIVE
Protein, ur: 100 mg/dL — AB
RBC / HPF: >50 RBC/hpf (ref 0–5)
Specific Gravity, Urine: 1.013 (ref 1.005–1.030)
WBC, UA: >50 WBC/hpf (ref 0–5)
pH: 5 (ref 5.0–8.0)

## 2023-05-21 LAB — BLOOD CULTURE ID PANEL (REFLEXED) - BCID2

## 2023-05-21 LAB — RESP PANEL BY RT-PCR (RSV, FLU A&B, COVID)  RVPGX2
Influenza A by PCR: NEGATIVE
Influenza B by PCR: NEGATIVE
Resp Syncytial Virus by PCR: NEGATIVE
SARS Coronavirus 2 by RT PCR: NEGATIVE

## 2023-05-21 LAB — TSH: TSH: 3.371 u[IU]/mL (ref 0.350–4.500)

## 2023-05-21 LAB — CK: Total CK: 159 U/L (ref 49–397)

## 2023-05-21 LAB — VITAMIN B12: Vitamin B-12: 592 pg/mL (ref 180–914)

## 2023-05-21 MED ORDER — SODIUM CHLORIDE 0.9 % IV SOLN
2.0000 g | Freq: Once | INTRAVENOUS | Status: AC
  Administered 2023-05-21: 2 g via INTRAVENOUS
  Filled 2023-05-21: qty 12.5

## 2023-05-21 MED ORDER — ACETAMINOPHEN 325 MG RE SUPP
650.0000 mg | Freq: Once | RECTAL | Status: AC
  Administered 2023-05-21: 650 mg via RECTAL
  Filled 2023-05-21: qty 2

## 2023-05-21 MED ORDER — ONDANSETRON HCL 4 MG PO TABS: 4.0000 mg | ORAL_TABLET | Freq: Four times a day (QID) | ORAL | Status: DC | PRN

## 2023-05-21 MED ORDER — ACETAMINOPHEN 325 MG PO TABS: 650.0000 mg | ORAL_TABLET | Freq: Four times a day (QID) | ORAL | Status: DC | PRN

## 2023-05-21 MED ORDER — SODIUM CHLORIDE 0.9 % IV SOLN
2.0000 g | Freq: Every day | INTRAVENOUS | Status: AC
  Administered 2023-05-21 – 2023-05-24 (×4): 2 g via INTRAVENOUS
  Filled 2023-05-21 (×4): qty 20

## 2023-05-21 MED ORDER — ALFUZOSIN HCL ER 10 MG PO TB24
10.0000 mg | ORAL_TABLET | Freq: Every day | ORAL | Status: DC
  Administered 2023-05-22 – 2023-05-25 (×4): 10 mg via ORAL
  Filled 2023-05-21 (×5): qty 1

## 2023-05-21 MED ORDER — PANTOPRAZOLE SODIUM 40 MG PO TBEC
40.0000 mg | DELAYED_RELEASE_TABLET | Freq: Every day | ORAL | Status: DC
  Administered 2023-05-22 – 2023-05-25 (×4): 40 mg via ORAL
  Filled 2023-05-21 (×4): qty 1

## 2023-05-21 MED ORDER — ENOXAPARIN SODIUM 40 MG/0.4ML IJ SOSY
40.0000 mg | PREFILLED_SYRINGE | INTRAMUSCULAR | Status: DC
Start: 2023-05-21 — End: 2023-05-25
  Administered 2023-05-21 – 2023-05-25 (×5): 40 mg via SUBCUTANEOUS
  Filled 2023-05-21 (×5): qty 0.4

## 2023-05-21 MED ORDER — VANCOMYCIN HCL 1750 MG/350ML IV SOLN
1750.0000 mg | Freq: Once | INTRAVENOUS | Status: AC
  Administered 2023-05-21: 1750 mg via INTRAVENOUS
  Filled 2023-05-21: qty 350

## 2023-05-21 MED ORDER — SODIUM CHLORIDE 0.9 % IV SOLN: 2.0000 g | INTRAVENOUS | Status: DC

## 2023-05-21 MED ORDER — SODIUM CHLORIDE 0.9 % IV BOLUS
2000.0000 mL | Freq: Once | INTRAVENOUS | Status: AC
  Administered 2023-05-21: 2000 mL via INTRAVENOUS

## 2023-05-21 MED ORDER — ONDANSETRON HCL 4 MG/2ML IJ SOLN: 4.0000 mg | Freq: Four times a day (QID) | INTRAMUSCULAR | Status: DC | PRN

## 2023-05-21 MED ORDER — ATORVASTATIN CALCIUM 20 MG PO TABS
40.0000 mg | ORAL_TABLET | Freq: Every evening | ORAL | Status: DC
  Administered 2023-05-22 – 2023-05-25 (×4): 40 mg via ORAL
  Filled 2023-05-21 (×4): qty 2

## 2023-05-21 MED ORDER — TAMSULOSIN HCL 0.4 MG PO CAPS: 0.4000 mg | ORAL_CAPSULE | Freq: Every day | ORAL | Status: DC

## 2023-05-21 MED ORDER — FLUTICASONE PROPIONATE 50 MCG/ACT NA SUSP: 2.0000 | Freq: Every day | NASAL | Status: DC | PRN

## 2023-05-21 MED ORDER — BENZONATATE 100 MG PO CAPS: 200.0000 mg | ORAL_CAPSULE | Freq: Two times a day (BID) | ORAL | Status: DC | PRN

## 2023-05-21 MED ORDER — SERTRALINE HCL 50 MG PO TABS
100.0000 mg | ORAL_TABLET | Freq: Every day | ORAL | Status: DC
  Administered 2023-05-22 – 2023-05-25 (×4): 100 mg via ORAL
  Filled 2023-05-21 (×4): qty 2

## 2023-05-21 MED ORDER — ALPRAZOLAM 0.25 MG PO TABS: 0.2500 mg | ORAL_TABLET | Freq: Every day | ORAL | Status: DC | PRN

## 2023-05-21 MED ORDER — HYDRALAZINE HCL 20 MG/ML IJ SOLN
5.0000 mg | Freq: Four times a day (QID) | INTRAMUSCULAR | Status: DC | PRN
  Administered 2023-05-21: 5 mg via INTRAVENOUS
  Filled 2023-05-21: qty 1

## 2023-05-21 NOTE — Progress Notes (Signed)
PT Cancellation Note  Patient Details Name: [name] MRN: [id number] DOB: 1940/06/24   Cancelled Treatment:    Reason Eval/Treat Not Completed: PT screened, no needs identified, will sign off. PT spoke with RN staff at group home that stated the pt is a hoyer lift to a WC. The pt is normally able to use his arms to participate eating (difficulty following commands for ADLs) but this had become difficult for him in the last 2-3wks, OT updated. PT to sign off due to pt being at his mobility baseline.    Olga Coaster PT, DPT 3:27 PM,05/21/23

## 2023-05-21 NOTE — Progress Notes (Signed)
Reviewed chart including [facility] Palliative care note and updated goals of care. Patient and family have established relationship with [facility] prior to admission. Based on outcome of meeting with [facility] provider and patients family,  I recommend proceeding with hospice referral as requested. At this time, Acute Inpatient Palliative Consults are >48 hours to consult encounter due to staffing and high patient volumes.  Anderson Malta, DO Palliative Medicine

## 2023-05-21 NOTE — Progress Notes (Signed)
ED Pharmacy Antibiotic Sign Off An antibiotic consult was received from an ED provider for Vancomycin per pharmacy dosing for sepsis. A chart review was completed to assess appropriateness.   The following one time order(s) were placed:  Vancomycin 1750 mg IV X 1.   Further antibiotic and/or antibiotic pharmacy consults should be ordered by the admitting provider if indicated.   Thank you for allowing pharmacy to be a part of this patient's care.   Scherrie Gerlach [facility]  Clinical Pharmacist 05/21/23 1:57 AM

## 2023-05-21 NOTE — Progress Notes (Signed)
OT Cancellation Note  Patient Details Name: [name] MRN: [id number] DOB: March 12, 1941   Cancelled Treatment:    Reason Eval/Treat Not Completed: Other (comment). Consult received, chart reviewed. Per secure chat, pt pending palliative care consult to help determine GOC.  Will defer OT evaluation at this time until the GOC/POC has been established. Will follow acutely and intervene as appropriate.   Arman Filter., MPH, MS, OTR/L ascom [phone] 05/21/23, 2:43 PM

## 2023-05-21 NOTE — Progress Notes (Addendum)
Civil engineer, contracting  [facility]) Hospital Liaison Note  Mr. Kevonte Vanecek is currently followed by [facility].  Christin Gusler, NP with [facility] PMT had a goals of care discussion with staff at [facility] and patient's sister/HCPOA.  At that meeting, they were all in agreement with hospice consult.  Patient was admitted to [facility] before an order was obtained.  Christin Gusler spoke with [facility] hospitalist to discuss plan of care and ask that patient receive a palliative care consult while in the hospital.  Palliative Care Consult was ordered.  Renea Ee, RN Nurse Liaison [phone]

## 2023-05-21 NOTE — Progress Notes (Signed)
Pt's legal Guardian [name] called to check on patient. Declined to setup password for future communication. [name] requested to be updated by primary team in the AM by phone on patient's plan of care.

## 2023-05-21 NOTE — Progress Notes (Addendum)
PHARMACY - PHYSICIAN COMMUNICATION CRITICAL VALUE ALERT - BLOOD CULTURE IDENTIFICATION (BCID)  [name] is an 83 y.o. male who presented to [facility] on 05/21/2023 with a chief complaint of fever from group home. Concern for UTI.    Assessment:  1/27 blood culture with GNR, BCID detects K pneumoniae (no resistance genes detected). Possible urinary source.   Name of physician (or Provider) Contacted: Dr Irving Burton  Current antibiotics: Ceftriaxone  Changes to prescribed antibiotics recommended:  Patient is on recommended antibiotics - No changes needed - adjust ceftriaxone start time to be 8h from last cefepime dose as cefepime frequency would be q8h in this patient  Results for orders placed or performed during the hospital encounter of 05/21/23  Blood Culture ID Panel (Reflexed) (Collected: 05/20/2023  8:17 PM)  Result Value Ref Range   Enterococcus faecalis NOT DETECTED NOT DETECTED   Enterococcus Faecium NOT DETECTED NOT DETECTED   Listeria monocytogenes NOT DETECTED NOT DETECTED   Staphylococcus species NOT DETECTED NOT DETECTED   Staphylococcus aureus (BCID) NOT DETECTED NOT DETECTED   Staphylococcus epidermidis NOT DETECTED NOT DETECTED   Staphylococcus lugdunensis NOT DETECTED NOT DETECTED   Streptococcus species NOT DETECTED NOT DETECTED   Streptococcus agalactiae NOT DETECTED NOT DETECTED   Streptococcus pneumoniae NOT DETECTED NOT DETECTED   Streptococcus pyogenes NOT DETECTED NOT DETECTED   A.calcoaceticus-baumannii NOT DETECTED NOT DETECTED   Bacteroides fragilis NOT DETECTED NOT DETECTED   Enterobacterales DETECTED (A) NOT DETECTED   Enterobacter cloacae complex NOT DETECTED NOT DETECTED   Escherichia coli NOT DETECTED NOT DETECTED   Klebsiella aerogenes NOT DETECTED NOT DETECTED   Klebsiella oxytoca NOT DETECTED NOT DETECTED   Klebsiella pneumoniae DETECTED (A) NOT DETECTED   Proteus species NOT DETECTED NOT DETECTED   Salmonella species NOT DETECTED NOT  DETECTED   Serratia marcescens NOT DETECTED NOT DETECTED   Haemophilus influenzae NOT DETECTED NOT DETECTED   Neisseria meningitidis NOT DETECTED NOT DETECTED   Pseudomonas aeruginosa NOT DETECTED NOT DETECTED   Stenotrophomonas maltophilia NOT DETECTED NOT DETECTED   Candida albicans NOT DETECTED NOT DETECTED   Candida auris NOT DETECTED NOT DETECTED   Candida glabrata NOT DETECTED NOT DETECTED   Candida krusei NOT DETECTED NOT DETECTED   Candida parapsilosis NOT DETECTED NOT DETECTED   Candida tropicalis NOT DETECTED NOT DETECTED   Cryptococcus neoformans/gattii NOT DETECTED NOT DETECTED   CTX-M ESBL NOT DETECTED NOT DETECTED   Carbapenem resistance IMP NOT DETECTED NOT DETECTED   Carbapenem resistance KPC NOT DETECTED NOT DETECTED   Carbapenem resistance NDM NOT DETECTED NOT DETECTED   Carbapenem resist OXA 48 LIKE NOT DETECTED NOT DETECTED   Carbapenem resistance VIM NOT DETECTED NOT DETECTED   Juliette Alcide, PharmD, BCPS, BCIDP Work Cell: [phone] 05/21/2023 10:09 AM

## 2023-05-21 NOTE — H&P (Signed)
History and Physical    [name]:[id number] DOB: 28-Dec-1940 DOA: 05/21/2023  PCP: Alba Cory, MD (Confirm with patient/family/NH records and if not entered, this has to be entered at [facility] point of entry) Patient coming from: Group home  I have personally briefly reviewed patient's old medical records in [facility] Link  Chief Complaint: AMS  HPI: [name] is a 83 y.o. male with medical history significant of intellectual disability, BPH, HLD, wheelchair dependent sent from group home for evaluation of strong smell of urine and confusion.  Patient is confused and only able to provide little bit history.  Most history provided by ED staff and group home record.  Appears that the patient has had several episodes of UTI since around Christmas time last year, but never respond patient had pansensitive Proteus UTI.Marland Kitchen  Last few days, it was found patient started develop strong smell of urine and worsening of baseline confusion.  Unclear what the patient had fever.  Patient denied any abdominal pain or back pain, he denied any dysuria or urinary frequency.  ED Course: Febrile temperature 102.5, blood pressure stable 120/90, nontachycardic and nonhypoxic.  UA again showed microscopic hematuria as well as pyuria but negative for nitrate.  WBC 16.2, hemoglobin 12.7, creatinine 0.7.  Checks x-ray showed no acute infiltrates.  Patient was started on vancomycin and cefepime in the ED.  Review of Systems: As per HPI otherwise 14 point review of systems negative.    Past Medical History:  Diagnosis Date   Anxiety    Atrophy, disuse, muscle    BPH (benign prostatic hyperplasia)    Cerebral palsy (HCC)    Eczema    Elevated lipids    Elevated PSA    Exotropia    History of kidney stones    Hyperlipidemia    Kidney stone    Leukopenia    Lower extremity edema    lymphedema   Mental retardation    Over weight    Prostate cancer (HCC)    Venous insufficiency    Vitamin  D deficiency    Wheelchair dependence     Past Surgical History:  Procedure Laterality Date   COLONOSCOPY WITH PROPOFOL N/A 05/10/2020   Procedure: COLONOSCOPY WITH PROPOFOL;  Surgeon: Toney Reil, MD;  Location: [facility] ENDOSCOPY;  Service: Gastroenterology;  Laterality: N/A;   COLONOSCOPY WITH PROPOFOL N/A 05/12/2020   Procedure: COLONOSCOPY WITH PROPOFOL;  Surgeon: Toney Reil, MD;  Location: [facility] ENDOSCOPY;  Service: Gastroenterology;  Laterality: N/A;   CYSTOSCOPY W/ RETROGRADES Left 10/04/2021   Procedure: CYSTOSCOPY WITH RETROGRADE PYELOGRAM;  Surgeon: Riki Altes, MD;  Location: [facility] ORS;  Service: Urology;  Laterality: Left;   CYSTOSCOPY/URETEROSCOPY/HOLMIUM LASER/STENT PLACEMENT Right 08/14/2017   Procedure: CYSTOSCOPY/URETEROSCOPY/HOLMIUM LASER/STENT PLACEMENT;  Surgeon: Riki Altes, MD;  Location: [facility] ORS;  Service: Urology;  Laterality: Right;   CYSTOSCOPY/URETEROSCOPY/HOLMIUM LASER/STENT PLACEMENT Left 10/04/2021   Procedure: CYSTOSCOPY/URETEROSCOPY/HOLMIUM LASER/STENT PLACEMENT/ LITHOPAXY;  Surgeon: Riki Altes, MD;  Location: [facility] ORS;  Service: Urology;  Laterality: Left;   ESOPHAGOGASTRODUODENOSCOPY (EGD) WITH PROPOFOL N/A 05/12/2020   Procedure: ESOPHAGOGASTRODUODENOSCOPY (EGD) WITH PROPOFOL;  Surgeon: Toney Reil, MD;  Location: [facility] ENDOSCOPY;  Service: Gastroenterology;  Laterality: N/A;   INCISION AND DRAINAGE ABSCESS Left 07/05/2021   Procedure: INCISION AND DRAINAGE SCROTAL WALL ABSCESS;  Surgeon: Riki Altes, MD;  Location: [facility] ORS;  Service: Urology;  Laterality: Left;   leg circulation surgery Right    PROSTATE SURGERY  11/10/2008   BRACHYTHERAPY  SCROTAL EXPLORATION Left 07/05/2021   Procedure: SCROTUM EXPLORATION;  Surgeon: Riki Altes, MD;  Location: [facility] ORS;  Service: Urology;  Laterality: Left;     reports that he has never smoked. He has never used smokeless tobacco. He reports that he does not drink alcohol and  does not use drugs.  No Known Allergies  Family History  Problem Relation Age of Onset   Heart attack Brother    Heart disease Other      Prior to Admission medications   Medication Sig Start Date End Date Taking? Authorizing Provider  acetaminophen (TYLENOL) 325 MG tablet TAKE 2 TABLETS (650 MG TOTAL) BY MOUTH EVERY 6 HOURS AS NEEDED FOR MILD PAIN OR HEADACHE. 05/08/22   Alba Cory, MD  alfuzosin (UROXATRAL) 10 MG 24 hr tablet Take 1 tablet (10 mg total) by mouth daily. 12/13/22   Alba Cory, MD  ALPRAZolam Prudy Feeler) 0.25 MG tablet Take 1 tablet (0.25 mg total) by mouth daily as needed (agitation (crisis med)). 05/01/23   Alford Highland, MD  atorvastatin (LIPITOR) 40 MG tablet Take 1 tablet (40 mg total) by mouth every evening. 12/13/22   Alba Cory, MD  augmented betamethasone dipropionate (DIPROLENE-AF) 0.05 % cream APPLY TOPICALLY AS DIRECTED TO RASH AS NEEDED 11/16/21   Carlynn Purl, Danna Hefty, MD  benzonatate (TESSALON) 100 MG capsule Take 2 capsules (200 mg total) by mouth 2 (two) times daily as needed for cough. 04/19/23   Berniece Salines, FNP  Calcium Carb-Cholecalciferol (CALCIUM + VITAMIN D3) 600-10 MG-MCG TABS TAKE 1 TABLET BY MOUTH TWICE DAILY. 04/11/23   Alba Cory, MD  fluconazole (DIFLUCAN) 100 MG tablet Take 1 tablet (100 mg total) by mouth daily. 05/02/23   Alford Highland, MD  fluticasone (FLONASE) 50 MCG/ACT nasal spray Place 2 sprays into both nostrils daily. 04/19/23   Berniece Salines, FNP  food thickener (RESOURCE THICKENUP CLEAR) POWD Take 120 g by mouth as needed. 05/01/23   Alford Highland, MD  GNP VITAMIN C 500 MG tablet TAKE 1 TABLET BY MOUTH ONCE DAILY 12/18/22   Alba Cory, MD  guaiFENesin (MUCINEX) 600 MG 12 hr tablet Take 1 tablet (600 mg total) by mouth 2 (two) times daily as needed for cough. 04/19/23   Berniece Salines, FNP  Incontinence Supply Disposable (DEPEND SILHOUETTE BRIEFS L/XL) MISC 1 each by Does not apply route 2 (two) times daily as  needed. 06/02/19   Alba Cory, MD  Nutritional Supplements (ENSURE ORIGINAL) LIQD DRINK 1 BOTTLE BY MOUTH 3 TIMES DAILY 11/24/22   Alba Cory, MD  nystatin cream (MYCOSTATIN) APPLY 1 APPLICATION TOPICALLY 2 TIMES DAILY 07/20/21   Marvel Plan, Carollee Herter A, PA-C  pantoprazole (PROTONIX) 40 MG tablet TAKE 1 TABLET BY MOUTH ONCE EVERY DAY 05/18/23   Alba Cory, MD  Polyethyl Glycol-Propyl Glycol (SYSTANE ULTRA OP) Place 1 drop into both eyes 2 (two) times daily as needed (dry eyes).    [provider]  sertraline (ZOLOFT) 100 MG tablet Take 1 tablet (100 mg total) by mouth daily. 12/13/22   Alba Cory, MD    Physical Exam: Vitals:   05/21/23 0930 05/21/23 1038 05/21/23 1055 05/21/23 1130  BP: (!) 175/161   (!) 123/90  Pulse: 78   81  Resp: 19   19  Temp:  (!) 100.4 F (38 C) (!) 101.4 F (38.6 C)   TempSrc:  Axillary Rectal   SpO2: 94%   95%  Weight:      Height:        Constitutional:  NAD, calm, comfortable Vitals:   05/21/23 0930 05/21/23 1038 05/21/23 1055 05/21/23 1130  BP: (!) 175/161   (!) 123/90  Pulse: 78   81  Resp: 19   19  Temp:  (!) 100.4 F (38 C) (!) 101.4 F (38.6 C)   TempSrc:  Axillary Rectal   SpO2: 94%   95%  Weight:      Height:       Eyes: PERRL, lids and conjunctivae normal ENMT: Mucous membranes are moist. Posterior pharynx clear of any exudate or lesions.Normal dentition.  Neck: normal, supple, no masses, no thyromegaly Respiratory: clear to auscultation bilaterally, no wheezing, no crackles. Normal respiratory effort. No accessory muscle use.  Cardiovascular: Regular rate and rhythm, no murmurs / rubs / gallops. No extremity edema. 2+ pedal pulses. No carotid bruits.  Abdomen: no tenderness, no masses palpated. No hepatosplenomegaly. Bowel sounds positive.  Musculoskeletal: no clubbing / cyanosis. No joint deformity upper and lower extremities. Good ROM, no contractures. Normal muscle tone.  Skin: no rashes, lesions, ulcers. No  induration Neurologic: CN 2-12 grossly intact. Sensation intact, DTR normal. Strength 5/5 in all 4.  Psychiatric: Normal judgment and insight. Alert and oriented x 3. Normal mood.     Labs on Admission: I have personally reviewed following labs and imaging studies  CBC: Recent Labs  Lab 05/20/23 2017  WBC 16.2*  NEUTROABS 14.8*  HGB 12.7*  HCT 38.1*  MCV 101.3*  PLT 102*   Basic Metabolic Panel: Recent Labs  Lab 05/20/23 2017  NA 142  K 4.1  CL 108  CO2 22  GLUCOSE 122*  BUN 35*  CREATININE 0.70  CALCIUM 9.1   GFR: Estimated Creatinine Clearance: 71.3 mL/min (by C-G formula based on SCr of 0.7 mg/dL). Liver Function Tests: Recent Labs  Lab 05/20/23 2017  AST 19  ALT 16  ALKPHOS 105  BILITOT 0.7  PROT 7.2  ALBUMIN 3.2*   No results for input(s): "LIPASE", "AMYLASE" in the last 168 hours. No results for input(s): "AMMONIA" in the last 168 hours. Coagulation Profile: Recent Labs  Lab 05/20/23 2017  INR 1.1   Cardiac Enzymes: Recent Labs  Lab 05/21/23 1001  CKTOTAL 159   BNP (last 3 results) No results for input(s): "PROBNP" in the last 8760 hours. HbA1C: No results for input(s): "HGBA1C" in the last 72 hours. CBG: No results for input(s): "GLUCAP" in the last 168 hours. Lipid Profile: No results for input(s): "CHOL", "HDL", "LDLCALC", "TRIG", "CHOLHDL", "LDLDIRECT" in the last 72 hours. Thyroid Function Tests: Recent Labs    05/21/23 1001  TSH 3.371   Anemia Panel: No results for input(s): "VITAMINB12", "FOLATE", "FERRITIN", "TIBC", "IRON", "RETICCTPCT" in the last 72 hours. Urine analysis:    Component Value Date/Time   COLORURINE YELLOW (A) 05/21/2023 0200   APPEARANCEUR CLOUDY (A) 05/21/2023 0200   APPEARANCEUR Cloudy (A) 01/05/2022 1622   LABSPEC 1.013 05/21/2023 0200   LABSPEC 1.012 05/25/2013 1506   PHURINE 5.0 05/21/2023 0200   GLUCOSEU NEGATIVE 05/21/2023 0200   GLUCOSEU Negative 05/25/2013 1506   HGBUR LARGE (A) 05/21/2023  0200   BILIRUBINUR NEGATIVE 05/21/2023 0200   BILIRUBINUR Negative 01/05/2022 1622   BILIRUBINUR Negative 05/25/2013 1506   KETONESUR NEGATIVE 05/21/2023 0200   PROTEINUR 100 (A) 05/21/2023 0200   UROBILINOGEN 0.2 10/03/2018 1032   NITRITE NEGATIVE 05/21/2023 0200   LEUKOCYTESUR LARGE (A) 05/21/2023 0200   LEUKOCYTESUR Negative 05/25/2013 1506    Radiological Exams on Admission: DG Chest Port 1 View Result Date: 05/20/2023  CLINICAL DATA:  Shortness of breath.  Fever. EXAM: PORTABLE CHEST 1 VIEW COMPARISON:  01/12/2023 FINDINGS: Low lung volumes. Stable heart size and mediastinal contours. Improved right perihilar opacity from prior. Minimal ill-defined bibasilar atelectasis. No pulmonary edema, large pleural effusion or pneumothorax. The bones are under mineralized. IMPRESSION: Low lung volumes with bibasilar atelectasis. Electronically Signed   By: Narda Rutherford M.D.   On: 05/20/2023 21:10    EKG: None  Assessment/Plan Principal Problem:   Sepsis due to gram-negative UTI [facility]) Active Problems:   Acute metabolic encephalopathy   Encephalopathy  (please populate well all problems here in Problem List. (For example, if patient is on BP meds at home and you resume or decide to hold them, it is a problem that needs to be her. Same for CAD, COPD, HLD and so on)   Sepsis, severe, with acute endorgan damage Recurrent UTI Klebsiella P.  Bacteremia -Sepsis evidenced by new onset of fever, leukocytosis, source of infection is recurrent UTI.  Acute endorgan damage including acute encephalopathy. -Microbiology lab reported blood culture done overnight showed pansensitive Klebsiella pneumonia. -Continue ceftriaxone -As per recommendation from pharmacy, will check PVR and renal ultrasound.  Medical team agreed. -Recheck blood cultures tomorrow.  Low suspicion for endocarditis as of today  Recurrent UTI -Check PVR and renal ultrasound -Expect Klebsiella P. bacteremia comes from UTI.   Waiting for urine cultures.  Acute metabolic encephalopathy -Secondary to sepsis, treat sepsis then reevaluate.  No focal neurological deficit, hold off further brain imaging at this point  History of BPH -On alpha 1 receptor antagonist -Will need outpatient urology follow-up for urodynamic study there has been 3 episodes of recurrent UTI within 1 month  DVT prophylaxis: Lovenox Code Status: Full code Family Communication: None at bedside Disposition Plan: Patient is sick with sepsis bacteremia, requiring IV antibiotics and concurrent mentation changes requiring close monitoring inpatient, expect more than 2 midnight hospital stay Consults called: None Admission status: Telemetry admission  Emeline General MD [facility] Pager [phone] 05/21/2023, 11:41 AM

## 2023-05-21 NOTE — ED Provider Notes (Addendum)
[facility] Provider Note    Event Date/Time   First MD Initiated Contact with Patient 05/21/23 0139     (approximate)   History   Code Sepsis   HPI  [name] is a 83 y.o. male   Past medical history of cerebral palsy, intellectual disability, wheelchair dependence, who presents the emergency department with concerns for infection from [facility] noted to have strong foul-smelling urine and temperature of 102.  He is not able to give history given his baseline comorbidities.  He was just seen in the hospital and discharged in early January with urinary tract infection.   External Medical Documents Reviewed: Discharge summary from 05/01/2023 for urinary tract infection      Physical Exam   Triage Vital Signs: ED Triage Vitals  Encounter Vitals Group     BP 05/20/23 2018 (!) 127/90     Systolic BP Percentile --      Diastolic BP Percentile --      Pulse Rate 05/20/23 2018 77     Resp 05/20/23 2018 (!) 22     Temp 05/20/23 2018 97.9 F (36.6 C)     Temp Source 05/20/23 2018 Oral     SpO2 05/20/23 2018 98 %     Weight 05/20/23 2013 154 lb 5.2 oz (70 kg)     Height 05/20/23 2013 5\' 8"  (1.727 m)     Head Circumference --      Peak Flow --      Pain Score 05/20/23 2013 0     Pain Loc --      Pain Education --      Exclude from Growth Chart --     Most recent vital signs: Vitals:   05/21/23 0200 05/21/23 0230  BP: 103/64 (!) 109/97  Pulse: 81 80  Resp: (!) 23 19  Temp:    SpO2: 94% 93%    General: Awake, no distress.  CV:  Good peripheral perfusion.  Resp:  Normal effort.  Abd:  No distention.  Other:  Mildly tachypneic.  Clear lung sounds.  Soft nontender abdomen without rigidity or guarding, no distention.  He feels warm to the touch.  His heart rate is normal.  He is hypertensive and febrile.  I assessed his skin on the torso, back, perineum and is no signs of infection or ulceration.  He has dry cracked lips  and appears dehydrated.   ED Results / Procedures / Treatments   Labs (all labs ordered are listed, but only abnormal results are displayed) Labs Reviewed  COMPREHENSIVE METABOLIC PANEL - Abnormal; Notable for the following components:      Result Value   Glucose, Bld 122 (*)    BUN 35 (*)    Albumin 3.2 (*)    All other components within normal limits  CBC WITH DIFFERENTIAL/PLATELET - Abnormal; Notable for the following components:   WBC 16.2 (*)    RBC 3.76 (*)    Hemoglobin 12.7 (*)    HCT 38.1 (*)    MCV 101.3 (*)    Platelets 102 (*)    Neutro Abs 14.8 (*)    Lymphs Abs 0.5 (*)    Abs Immature Granulocytes 0.08 (*)    All other components within normal limits  URINALYSIS, W/ REFLEX TO CULTURE (INFECTION SUSPECTED) - Abnormal; Notable for the following components:   Color, Urine YELLOW (*)    APPearance CLOUDY (*)    Hgb urine dipstick LARGE (*)  Protein, ur 100 (*)    Leukocytes,Ua LARGE (*)    Bacteria, UA RARE (*)    All other components within normal limits  RESP PANEL BY RT-PCR (RSV, FLU A&B, COVID)  RVPGX2  CULTURE, BLOOD (ROUTINE X 2)  CULTURE, BLOOD (ROUTINE X 2)  URINE CULTURE  LACTIC ACID, PLASMA  PROTIME-INR     I ordered and reviewed the above labs they are notable for he has a leukocytosis of 16.2  RADIOLOGY I independently reviewed and interpreted chest x-ray and I see no obvious focality or pneumothorax I also reviewed radiologist's formal read.   PROCEDURES:  Critical Care performed: Yes, see critical care procedure note(s)  .Critical Care  Performed by: Pilar Jarvis, MD Authorized by: Pilar Jarvis, MD   Critical care provider statement:    Critical care time (minutes):  30   Critical care was time spent personally by me on the following activities:  Development of treatment plan with patient or surrogate, discussions with consultants, evaluation of patient's response to treatment, examination of patient, ordering and review of laboratory  studies, ordering and review of radiographic studies, ordering and performing treatments and interventions, pulse oximetry, re-evaluation of patient's condition and review of old charts    MEDICATIONS ORDERED IN ED: Medications  vancomycin (VANCOREADY) IVPB 1750 mg/350 mL (has no administration in time range)  ceFEPIme (MAXIPIME) 2 g in sodium chloride 0.9 % 100 mL IVPB (2 g Intravenous New Bag/Given 05/21/23 0226)  sodium chloride 0.9 % bolus 2,000 mL (2,000 mLs Intravenous New Bag/Given 05/21/23 0223)  acetaminophen (TYLENOL) suppository 650 mg (650 mg Rectal Given 05/21/23 0207)    External physician / consultants:  I spoke with hospital medicine for admission and regarding care plan for this patient.   IMPRESSION / MDM / ASSESSMENT AND PLAN / ED COURSE  I reviewed the triage vital signs and the nursing notes.                                Patient's presentation is most consistent with acute presentation with potential threat to life or bodily function.  Differential diagnosis includes, but is not limited to, urinary tract infection, respiratory infection, viral URI, sepsis, intra-abdominal infection, skin infection   The patient is on the cardiac monitor to evaluate for evidence of arrhythmia and/or significant heart rate changes.  MDM:    Concerns for sepsis in this patient who is altered from baseline apparently notes from last hospitalization states that he is minimally verbal but he is nonverbal today and not answering my questions appropriately, has a fever, leukocytosis and tachypnea.  No localizing signs of infection by report nor by exam though reported foul-smelling urine and recent UTI makes me suspicious for UTI.  Sepsis labs obtained, lactic normal, sending blood cultures, got 30 cc/kg ideal body weight fluid bolus, broad-spectrum antibiotics with vancomycin and cefepime and will obtain urinalysis and viral swabs.  Will require admission.  Urinalysis shows urine  infection.  Admission.      FINAL CLINICAL IMPRESSION(S) / ED DIAGNOSES   Final diagnoses:  Fever, unspecified fever cause  Urinary tract infection without hematuria, site unspecified  Altered mental status, unspecified altered mental status type     Rx / DC Orders   ED Discharge Orders     None        Note:  This document was prepared using Dragon voice recognition software and may include unintentional dictation errors.  Pilar Jarvis, MD 05/21/23 Wendie Agreste    Pilar Jarvis, MD 05/21/23 [phone]

## 2023-05-22 DIAGNOSIS — N390 Urinary tract infection, site not specified: Secondary | ICD-10-CM | POA: Diagnosis not present

## 2023-05-22 DIAGNOSIS — A4150 Gram-negative sepsis, unspecified: Secondary | ICD-10-CM | POA: Diagnosis not present

## 2023-05-22 LAB — CBC
HCT: 34.4 % — ABNORMAL LOW (ref 39.0–52.0)
Hemoglobin: 11.2 g/dL — ABNORMAL LOW (ref 13.0–17.0)
MCH: 33.2 pg (ref 26.0–34.0)
MCHC: 32.6 g/dL (ref 30.0–36.0)
MCV: 102.1 fL — ABNORMAL HIGH (ref 80.0–100.0)
Platelets: 95 10*3/uL — ABNORMAL LOW (ref 150–400)
RBC: 3.37 MIL/uL — ABNORMAL LOW (ref 4.22–5.81)
RDW: 14.3 % (ref 11.5–15.5)
WBC: 5.6 10*3/uL (ref 4.0–10.5)
nRBC: 0 % (ref 0.0–0.2)

## 2023-05-22 LAB — BASIC METABOLIC PANEL
Anion gap: 10 (ref 5–15)
BUN: 35 mg/dL — ABNORMAL HIGH (ref 8–23)
CO2: 22 mmol/L (ref 22–32)
Calcium: 9.1 mg/dL (ref 8.9–10.3)
Chloride: 115 mmol/L — ABNORMAL HIGH (ref 98–111)
Creatinine, Ser: 0.87 mg/dL (ref 0.61–1.24)
GFR, Estimated: >60 mL/min (ref >60)
Glucose, Bld: 86 mg/dL (ref 70–99)
Potassium: 3.3 mmol/L — ABNORMAL LOW (ref 3.5–5.1)
Sodium: 147 mmol/L — ABNORMAL HIGH (ref 135–145)

## 2023-05-22 MED ORDER — CHLORHEXIDINE GLUCONATE CLOTH 2 % EX PADS
6.0000 | MEDICATED_PAD | Freq: Every day | CUTANEOUS | Status: DC
  Administered 2023-05-22 – 2023-05-23 (×2): 6 via TOPICAL

## 2023-05-22 NOTE — Progress Notes (Addendum)
[facility] Liaison Note:    Received a referral this morning from [facility], Darrian Shoffner,LCSW, to set up Hospice services for patient.  Referral submitted today.  Patient's niece, [name], stated that no additional DME was needed.  Left a message with [facility] representative, Ulyess Blossom, asking her to call if additional DME was required.    Please don't hesitate to call with any Hospice related questions or concerns.    Thank you for the opportunity to participate in this patient's care.  [facility] Liaison [phone]

## 2023-05-22 NOTE — Evaluation (Signed)
Clinical/Bedside Swallow Evaluation Patient Details  Name: [name] MRN: [id number] Date of Birth: Sep 21, 1940  Today's Date: 05/22/2023 Time: SLP Start Time (ACUTE ONLY): 0945 SLP Stop Time (ACUTE ONLY): 1045 SLP Time Calculation (min) (ACUTE ONLY): 60 min  Past Medical History:  Past Medical History:  Diagnosis Date   Anxiety    Atrophy, disuse, muscle    BPH (benign prostatic hyperplasia)    Cerebral palsy (HCC)    Eczema    Elevated lipids    Elevated PSA    Exotropia    History of kidney stones    Hyperlipidemia    Kidney stone    Leukopenia    Lower extremity edema    lymphedema   Mental retardation    Over weight    Prostate cancer (HCC)    Venous insufficiency    Vitamin D deficiency    Wheelchair dependence    Past Surgical History:  Past Surgical History:  Procedure Laterality Date   COLONOSCOPY WITH PROPOFOL N/A 05/10/2020   Procedure: COLONOSCOPY WITH PROPOFOL;  Surgeon: Toney Reil, MD;  Location: [facility] ENDOSCOPY;  Service: Gastroenterology;  Laterality: N/A;   COLONOSCOPY WITH PROPOFOL N/A 05/12/2020   Procedure: COLONOSCOPY WITH PROPOFOL;  Surgeon: Toney Reil, MD;  Location[facility] ENDOSCOPY;  Service: Gastroenterology;  Laterality: N/A;   CYSTOSCOPY W/ RETROGRADES Left 10/04/2021   Procedure: CYSTOSCOPY WITH RETROGRADE PYELOGRAM;  Surgeon: Riki Altes, MD;  Location: [facility] ORS;  Service: Urology;  Laterality: Left;   CYSTOSCOPY/URETEROSCOPY/HOLMIUM LASER/STENT PLACEMENT Right 08/14/2017   Procedure: CYSTOSCOPY/URETEROSCOPY/HOLMIUM LASER/STENT PLACEMENT;  Surgeon: Riki Altes, MD;  Location: [facility] ORS;  Service: Urology;  Laterality: Right;   CYSTOSCOPY/URETEROSCOPY/HOLMIUM LASER/STENT PLACEMENT Left 10/04/2021   Procedure: CYSTOSCOPY/URETEROSCOPY/HOLMIUM LASER/STENT PLACEMENT/ LITHOPAXY;  Surgeon: Riki Altes, MD;  Location: [facility] ORS;  Service: Urology;  Laterality: Left;   ESOPHAGOGASTRODUODENOSCOPY (EGD) WITH PROPOFOL N/A  05/12/2020   Procedure: ESOPHAGOGASTRODUODENOSCOPY (EGD) WITH PROPOFOL;  Surgeon: Toney Reil, MD;  Location: [facility] ENDOSCOPY;  Service: Gastroenterology;  Laterality: N/A;   INCISION AND DRAINAGE ABSCESS Left 07/05/2021   Procedure: INCISION AND DRAINAGE SCROTAL WALL ABSCESS;  Surgeon: Riki Altes, MD;  Location: [facility] ORS;  Service: Urology;  Laterality: Left;   leg circulation surgery Right    PROSTATE SURGERY  11/10/2008   BRACHYTHERAPY   SCROTAL EXPLORATION Left 07/05/2021   Procedure: SCROTUM EXPLORATION;  Surgeon: Riki Altes, MD;  Location: [facility] ORS;  Service: Urology;  Laterality: Left;   HPI:  [name] is a 83 y.o. male with medical history significant of intellectual disability, hyperlipidemia, mental retardation, BPH, wheelchair dependent presenting with sepsis, acute respiratory failure with hypoxia, aspiration pneumonia, UTI, hypernatremia, Proteus bacteremia.  Limited history in the setting of electrical disability.  Patient noted to have been evaluated in the ER in 03/2023.  Was formally diagnosed with issues including UTI.  Was discharged back to group home on Keflex.   CXR at admit: Low lung volumes with bibasilar atelectasis.       OF NOTE: pt had a MBSS on 04/24/2023: "Pt presents with severe oral phase dysphagia that is likely cognition based and is c/b oral holding of all administered boluses, lingual lingual manipulation of boluses and rocking of all boluses d/t difficulty with AP propulsion. As a result, his oral phase is prolonged with ~ half of the bolus remaining in his mouth following each swallow. Pt is at increased risk of aspirating oral residue and he is likely to required extensive time to consume  meals as maximal multimodal cues were not helpful in pt swallowing.     Pt also presents with moderate to severe pharyngeal phase dysphagia.  At this time, would recommend least restrictive diet of dysphagia 1 with nectar thick liquids via cup. Despite this  modified diet, pt remains at a VERY HIGH RISK OF ASPIRATION d/t feeding dependency, current medical decline, baseline intellectual deficits and moderate oropharyngeal dysphagia. At this time, further ST services are not ikely to impact pt's abilities. Given severity of swallow impairment, would recommend discussion with Palliative Care for establishment of GOC.".     Assessment / Plan / Recommendation  Clinical Impression   Pt seen for BSE today. Pt alert, resting in bed. Minimal verbal engagement but followed instructions w/ cues. He only allowed minimal oral care then closed mouth to further.  On RA, afebrile. WBC WNL currently.  Pt has a KNOWN h/o oropharyngeal phase Dysphagia per MBSS 04/24/2023 w/ noted HIGH risk for aspiration/aspiration pneumonia.  Currently, pt appears to present w/ oropharyngeal phase dysphagia in setting of declined Cognitive status; Baseline oromotor weakness and Dysphagia. ANY Cognitive decline can impact overall awareness/timing of swallow and safety during po tasks which increases risk for aspiration, choking.  Pt's risk for aspiration was identified during the MBSS in 03/2023 w/ the Dysphagia level 1 diet and Nectar liquids recommended then. The same diet consistency along w/ aspiration precautions and support feeding at meals using MOD verbal/visual/tactile cues for follow through during po tasks is again recommended.        Restricted po consistencies assessed in setting of pt's KNOWN Dysphagia per MBSS. Pt was given several trials of purees and tsps of Nectar liquids w/ No overt clinical s/s of aspiration noted: no decline in respiratory presentation nor cough was noted during/post trials. O2 sats were 96% when checked. Oral phase deficits were pronounced c/b reduced oral awareness of boluses then lengthy oral phase and bolus management time; oral holding noted intermittently. Cues and Time given to encourage A-P transfer and swallowing; oral clearing achieved w/ the  boluses given. There was no attempt to feed self noted.  OM Exam attempted; pt did not follow through w/ tasks. Oral care was inconsistent based on pt's participation.          In setting of Baseline, KNOWN Dysphagia (and per MBSS recommendations), recommend the Dysphagia level 1(PUREE foods moistened for ease of oral phase) w/ Nectar liquids via TSP; aspiration precautions including Time b/t bites/sips for oral clearing; reduce Distractions during meals and engage pt during meals for self-feeding if able. Alternate foods/liquids. FULL Feeding assistance indicated currently. 100% Supervision w/ all oral intake. Pills Crushed in Puree for safer swallowing as needed.  MD/NSG updated.  ST services recommends follow w/ Palliative Care for GOC and education re: impact of his Dysphagia as well as Cognitive decline/comorbidites on his swallowing. Suspect pt is close to/at his Baseline per MBSS results. Precautions posted in room. No further skilled ST services indicated at this time. F/u at his next venue of care if any further education/needs indicated.  SLP Visit Diagnosis: Dysphagia, oropharyngeal phase (R13.12) (BASELINE; Cogntiive decline baseline)    Aspiration Risk  Moderate aspiration risk;Severe aspiration risk;Risk for inadequate nutrition/hydration    Diet Recommendation   Nectar;Dysphagia 1 (puree) = Dysphagia level 1(PUREE foods moistened for ease of oral phase) w/ Nectar liquids via TSP; aspiration precautions including Time b/t bites/sips for oral clearing; reduce Distractions during meals and engage pt during meals for self-feeding if able. Alternate foods/liquids.  FULL Feeding assistance indicated currently. 100% Supervision w/ all oral intake.  Medication Administration: Crushed with puree    Other  Recommendations Recommended Consults:  (Palliative Care for GOC; Dietician) Oral Care Recommendations: Oral care BID;Oral care before and after PO;Staff/trained caregiver to provide oral  care Caregiver Recommendations: Avoid jello, ice cream, thin soups, popsicles;Remove water pitcher;Have oral suction available    Recommendations for follow up therapy are one component of a multi-disciplinary discharge planning process, led by the attending physician.  Recommendations may be updated based on patient status, additional functional criteria and insurance authorization.  Follow up Recommendations Follow physician's recommendations for discharge plan and follow up therapies (next venue of care)      Assistance Recommended at Discharge  FULL  Functional Status Assessment Patient has had a recent decline in their functional status and/or demonstrates limited ability to make significant improvements in function in a reasonable and predictable amount of time  Frequency and Duration  (n/a)   (n/a)       Prognosis Prognosis for improved oropharyngeal function: Guarded Barriers to Reach Goals: Cognitive deficits;Severity of deficits;Language deficits;Time post onset Barriers/Prognosis Comment: KNOWN Cognitive decline and Dysphagia      Swallow Study   General Date of Onset: 05/20/23 HPI: Aldred Mase is a 83 y.o. male with medical history significant of intellectual disability, hyperlipidemia, mental retardation, BPH, wheelchair dependent presenting with sepsis, acute respiratory failure with hypoxia, aspiration pneumonia, UTI, hypernatremia, Proteus bacteremia.  Limited history in the setting of electrical disability.  Patient noted to have been evaluated in the ER in 03/2023.  Was formally diagnosed with issues including UTI.  Was discharged back to group home on Keflex.   CXR at admit: Low lung volumes with bibasilar atelectasis.     OF NOTE: pt had a MBSS on 04/24/2023: "Pt presents with severe oral phase dysphagia that is likely cognition based and is c/b oral holding of all administered boluses, lingual lingual manipulation of boluses and rocking of all boluses d/t difficulty  with AP propulsion. As a result, his oral phase is prolonged with ~ half of the bolus remaining in his mouth following each swallow. Pt is at increased risk of aspirating oral residue and he is likely to required extensive time to consume meals as maximal multimodal cues were not helpful in pt swallowing.     Pt also presents with moderate to severe pharyngeal phase dysphagia.  At this time, would recommend least restrictive diet of dysphagia 1 with nectar thick liquids via cup. Despite this modified diet, pt remains at a VERY HIGH RISK OF ASPIRATION d/t feeding dependency, current medical decline, baseline intellectual deficits and moderate oropharyngeal dysphagia. At this time, further ST services are not ikely to impact pt's abilities. Given severity of swallow impairment, would recommend discussion with Palliative Care for establishment of GOC.". Type of Study: Bedside Swallow Evaluation Previous Swallow Assessment: MBSS 04/24/23; BSEs Diet Prior to this Study: NPO Temperature Spikes Noted: No (wbc 5.6) Respiratory Status: Room air History of Recent Intubation: No Behavior/Cognition: Alert;Confused;Distractible;Doesn't follow directions Oral Cavity Assessment: Dried secretions;Dry Oral Care Completed by SLP: Yes (attempted) Oral Cavity - Dentition: Edentulous Vision:  (n/a) Self-Feeding Abilities: Total assist Patient Positioning: Upright in bed (MAX assist) Baseline Vocal Quality: Low vocal intensity (mumbled x2) Volitional Cough: Cognitively unable to elicit Volitional Swallow: Unable to elicit    Oral/Motor/Sensory Function Overall Oral Motor/Sensory Function: Generalized oral weakness   Ice Chips Ice chips: Not tested   Thin Liquid Thin Liquid: Not tested  Nectar Thick Nectar Thick Liquid: Impaired Presentation: Spoon (10 trials) Oral Phase Impairments: Reduced lingual movement/coordination;Poor awareness of bolus Oral phase functional implications: Prolonged oral transit;Oral  holding Pharyngeal Phase Impairments: Suspected delayed Swallow   Honey Thick Honey Thick Liquid: Not tested   Puree Puree: Impaired Presentation: Spoon (fed; 9 trials) Oral Phase Impairments: Reduced lingual movement/coordination;Poor awareness of bolus Oral Phase Functional Implications: Prolonged oral transit;Oral residue;Oral holding Pharyngeal Phase Impairments: Suspected delayed Swallow   Solid     Solid: Not tested        Jerilynn Som, MS, CCC-SLP Speech Language Pathologist Rehab Services; [facility] [place] [phone] (ascom) Anaria Kroner 05/22/2023,2:23 PM

## 2023-05-22 NOTE — Evaluation (Signed)
Occupational Therapy Evaluation Patient Details Name: [name] MRN: [id number] DOB: 09/08/1940 Today's Date: 05/22/2023   History of Present Illness Pt is an 83 yo male that presented to the ED for increased confusion, foul smelling urine. PMH of anxiety, CP, ID, lymphedema, prostate cancer. Per chart from group home, WC bound.   Clinical Impression   [name] was seen for OT evaluation this date. Prior to hospital admission, pt was receiving support for ADL/IADL management in a group home setting. Per chart, pt was able to feed himself with increased time/effort, and using a hoyer lift to transfer to/from his WC at baseline. He is unable to self-propel his WC. Pt currently pending palliative consult to establish GOC. Pt presents to acute OT demonstrating impaired ADL performance and functional mobility 2/2 decreased functional use of BUE, decreased cognition, and decreased activity tolerance (See OT problem list for additional functional deficits). Pt currently requires MAX-TOTAL A for all UB/LB ADL management at bed level.  Pt would benefit from skilled OT services to address noted impairments and functional limitations (see below for any additional details) in order to maximize safety and independence while minimizing falls risk and caregiver burden. Will place on OT trial for 3 sessions while awaiting GOC and to determine if pt able to actively participate. Anticipate pt will require LTC upon acute hospital DC.         If plan is discharge home, recommend the following: Two people to help with walking and/or transfers;Assistance with cooking/housework;Two people to help with bathing/dressing/bathroom;Assistance with feeding;Help with stairs or ramp for entrance;Assist for transportation;Supervision due to cognitive status;Direct supervision/assist for medications management;Direct supervision/assist for financial management    Functional Status Assessment  Patient has had a recent  decline in their functional status and/or demonstrates limited ability to make significant improvements in function in a reasonable and predictable amount of time  Equipment Recommendations  Other (comment) (defer to next venue of care)    Recommendations for Other Services       Precautions / Restrictions Precautions Precautions: Fall Restrictions Weight Bearing Restrictions Per Provider Order: No      Mobility Bed Mobility Overal bed mobility: Needs Assistance             General bed mobility comments: TOTAL A +2 to boost up in bed. MAX A to re-position. Does attempt to adjust self as bed is brought into chair mode, but with limited success.    Transfers                          Balance Overall balance assessment: Needs assistance                                         ADL either performed or assessed with clinical judgement   ADL Overall ADL's : Needs assistance/impaired                                       General ADL Comments: MAX-TOTAL A for UB/LB ADL management. Does attempt to grasp comb/washcloth for UB grooming with bed in chair position. Currently NPO so unable to trial sef-feeding, will continue to assess during OT trial.     Vision         Perception  Praxis         Pertinent Vitals/Pain Pain Assessment Pain Assessment: Faces Faces Pain Scale: No hurt Pain Intervention(s): Monitored during session     Extremity/Trunk Assessment Upper Extremity Assessment Upper Extremity Assessment: Generalized weakness;Difficult to assess due to impaired cognition   Lower Extremity Assessment Lower Extremity Assessment: Generalized weakness;Difficult to assess due to impaired cognition       Communication Communication Communication: Difficulty following commands/understanding;Difficulty communicating thoughts/reduced clarity of speech Following commands: Follows one step commands  inconsistently;Follows one step commands with increased time Cueing Techniques: Verbal cues   Cognition Arousal: Alert   Overall Cognitive Status: No family/caregiver present to determine baseline cognitive functioning                                 General Comments: Does not verbalized during session, intermittently able to follow very simple VCs (e.g. raise your arm)  but with increased time/effort. No family/caregiver at bedside to determine baseline cognition.     General Comments       Exercises Other Exercises Other Exercises: Pt education limited 2/2 baseline cognition. Educated on safety, positioning, and falls prevention t/o with limited evidiece of learning.   Shoulder Instructions      Home Living Family/patient expects to be discharged to:: Group home Living Arrangements: Group Home                                      Prior Functioning/Environment               Mobility Comments: Per notes, pt is Hoyer to [facility] at baseline, typically unable to self-propel. ADLs Comments: Does feed himself with extended time/effort to perform. Requires significant assist for LB ADL management.        OT Problem List: Decreased range of motion;Decreased strength;Decreased coordination;Decreased activity tolerance;Impaired balance (sitting and/or standing);Impaired UE functional use      OT Treatment/Interventions: Self-care/ADL training;Therapeutic exercise;Therapeutic activities;DME and/or AE instruction;Patient/family education;Balance training;Energy conservation;Neuromuscular education;Cognitive remediation/compensation    OT Goals(Current goals can be found in the care plan section) Acute Rehab OT Goals Patient Stated Goal: Pt unable to state Time For Goal Achievement: 06/05/23 ADL Goals Pt Will Perform Eating: sitting;bed level;with min assist;with adaptive utensils Pt Will Perform Grooming: sitting;bed level;with min assist Pt Will Perform  Upper Body Bathing: with mod assist;bed level;sitting  OT Frequency: Min 1X/week    Co-evaluation              AM-PAC OT "6 Clicks" Daily Activity     Outcome Measure Help from another person eating meals?: A Lot Help from another person taking care of personal grooming?: A Lot Help from another person toileting, which includes using toliet, bedpan, or urinal?: Total Help from another person bathing (including washing, rinsing, drying)?: Total Help from another person to put on and taking off regular upper body clothing?: Total Help from another person to put on and taking off regular lower body clothing?: Total 6 Click Score: 8   End of Session    Activity Tolerance: Patient tolerated treatment well Patient left: in bed                   Time: 2956-2130 OT Time Calculation (min): 19 min Charges:  OT General Charges $OT Visit: 1 Visit OT Evaluation $OT Eval Moderate Complexity: 1 Mod  Chakira Jachim  Smith Robert, M.S., OTR/L 05/22/23, 11:40 AM

## 2023-05-22 NOTE — Hospital Course (Addendum)
Hospital course / significant events:   HPI: [name] is a 83 y.o. male with medical history significant of intellectual disability, BPH, HLD, wheelchair dependent sent from group home for evaluation strong smell of urine and worsening of baseline confusion over last few days, hx UTI.  01/26-01/27: to ED late 01/26. temperature 102.5.   WBC 16.2, UA microscopic hematuria as well as pyuria. Patient was started on vancomycin and cefepime in the ED. Admitted to hospitalist. (+)klebsiella bacteremia d/t UTI 01/28: repeat BCx    Consultants:  none  Procedures/Surgeries: none      ASSESSMENT & PLAN:   Sepsis, severe, with acute endorgan damage Recurrent UTI Klebsiella P.  Bacteremia Sepsis evidenced by new onset of fever, leukocytosis, source of infection is recurrent UTI.  Acute endorgan damage including acute encephalopathy. Continue ceftriaxone  check PVR  Check renal ultrasound.   Recheck blood cultures today.   Low suspicion for endocarditis will defer Echo for now pend GOC discussion  Follow UCx/BCx for sensitivities/susceptibilities    Acute metabolic encephalopathy Secondary to sepsis No focal neurological deficit, hold off further brain imaging at this point   Hypokalemia Replace as needed Monitor BMP  Macrocytic anemia, chronic Baseline Follow CBC, B12, Folate   History of BPH On alpha 1 receptor antagonist Checking post-void residual and renal US  Will need outpatient urology follow-up for urodynamic study there has been 3 episodes of recurrent UTI within 1 month      DVT prophylaxis: lovenox IV fluids: no continuous IV fluids  Nutrition: dysphagia 1 diet  Central lines / invasive devices: Foley  Code Status: DNR ACP documentation reviewed:  none on file in [facility]  TOC needs: will likely need placement  Barriers to dispo / significant pending items: cultures, placement

## 2023-05-22 NOTE — Plan of Care (Signed)
Pt remains NPO overnight after failing bedside swallow evaluation in the ED. On call provider deferred Speech consult to primary team.    Problem: Education: Goal: Knowledge of General Education information will improve Description: Including pain rating scale, medication(s)/side effects and non-pharmacologic comfort measures Outcome: Progressing   Problem: Health Behavior/Discharge Planning: Goal: Ability to manage health-related needs will improve Outcome: Progressing   Problem: Clinical Measurements: Goal: Ability to maintain clinical measurements within normal limits will improve Outcome: Progressing Goal: Cardiovascular complication will be avoided Outcome: Progressing   Problem: Activity: Goal: Risk for activity intolerance will decrease Outcome: Progressing   Problem: Coping: Goal: Level of anxiety will decrease Outcome: Progressing

## 2023-05-22 NOTE — Progress Notes (Signed)
PROGRESS NOTE    [name]   WUJ:[id number] DOB: 02/15/1941  DOA: 05/21/2023 Date of Service: 05/22/23 which is hospital day 1  PCP: Alba Cory, MD    Hospital course / significant events:   HPI: [name] is a 83 y.o. male with medical history significant of intellectual disability, BPH, HLD, wheelchair dependent sent from group home for evaluation strong smell of urine and worsening of baseline confusion over last few days, hx UTI.  01/26-01/27: to ED late 01/26. temperature 102.5.   WBC 16.2, UA microscopic hematuria as well as pyuria. Patient was started on vancomycin and cefepime in the ED. Admitted to hospitalist. (+)klebsiella bacteremia d/t UTI 01/28: repeat BCx    Consultants:  none  Procedures/Surgeries: none      ASSESSMENT & PLAN:   Sepsis, severe, with acute endorgan damage Recurrent UTI Klebsiella P.  Bacteremia Sepsis evidenced by new onset of fever, leukocytosis, source of infection is recurrent UTI.  Acute endorgan damage including acute encephalopathy. Continue ceftriaxone  check PVR  Check renal ultrasound.   Recheck blood cultures today.   Low suspicion for endocarditis will defer Echo for now pend GOC discussion  Follow UCx/BCx for sensitivities/susceptibilities    Acute metabolic encephalopathy Secondary to sepsis No focal neurological deficit, hold off further brain imaging at this point   Hypokalemia Replace as needed Monitor BMP  Macrocytic anemia, chronic Baseline Follow CBC, B12, Folate   History of BPH On alpha 1 receptor antagonist Checking post-void residual and renal US  Will need outpatient urology follow-up for urodynamic study there has been 3 episodes of recurrent UTI within 1 month      DVT prophylaxis: lovenox IV fluids: no continuous IV fluids  Nutrition: dysphagia 1 diet  Central lines / invasive devices: Foley  Code Status: DNR ACP documentation reviewed:  none on file in [facility]  TOC  needs: will likely need placement  Barriers to dispo / significant pending items: cultures, placement             Subjective / Brief ROS:  Patient minimally verbal, he is alert and responsive but speech is difficult to comprehend. Denies pain.    Family Communication: called niece/guardian 05/22/23 6:40 PM to give update all questions answered     Objective Findings:  Vitals:   05/21/23 2016 05/22/23 0513 05/22/23 0813 05/22/23 1700  BP: 94/79 (!) 134/109 105/77 120/73  Pulse: 78 73 68 73  Resp: 20 20  19   Temp: 99.1 F (37.3 C) 97.8 F (36.6 C) 98.2 F (36.8 C) 99.3 F (37.4 C)  TempSrc:   Oral   SpO2: 95% 95% 95% 93%  Weight:      Height:        Intake/Output Summary (Last 24 hours) at 05/22/2023 1840 Last data filed at 05/22/2023 0435 Gross per 24 hour  Intake --  Output 900 ml  Net -900 ml   Filed Weights   05/20/23 2013 05/21/23 1734  Weight: 70 kg 64.4 kg    Examination:  Physical Exam Constitutional:      General: He is not in acute distress. Cardiovascular:     Rate and Rhythm: Normal rate and regular rhythm.  Pulmonary:     Effort: Pulmonary effort is normal.     Breath sounds: Normal breath sounds.  Musculoskeletal:     Right lower leg: No edema.     Left lower leg: No edema.  Neurological:     Mental Status: He is alert. Mental status is  at baseline.  Psychiatric:        Behavior: Behavior normal.          Scheduled Medications:   alfuzosin  10 mg Oral Daily   atorvastatin  40 mg Oral QPM   Chlorhexidine Gluconate Cloth  6 each Topical Daily   enoxaparin (LOVENOX) injection  40 mg Subcutaneous Q24H   pantoprazole  40 mg Oral Daily   sertraline  100 mg Oral Daily    Continuous Infusions:  cefTRIAXone (ROCEPHIN)  IV 2 g (05/22/23 1347)    PRN Medications:  acetaminophen, ALPRAZolam, benzonatate, fluticasone, hydrALAZINE, ondansetron **OR** ondansetron (ZOFRAN) IV  Antimicrobials from admission:  Anti-infectives (From  admission, onward)    Start     Dose/Rate Route Frequency Ordered Stop   05/22/23 0600  cefTRIAXone (ROCEPHIN) 2 g in sodium chloride 0.9 % 100 mL IVPB  Status:  Discontinued        2 g 200 mL/hr over 30 Minutes Intravenous Every 24 hours 05/21/23 0839 05/21/23 1047   05/21/23 1130  cefTRIAXone (ROCEPHIN) 2 g in sodium chloride 0.9 % 100 mL IVPB        2 g 200 mL/hr over 30 Minutes Intravenous Daily 05/21/23 1047     05/21/23 0200  ceFEPIme (MAXIPIME) 2 g in sodium chloride 0.9 % 100 mL IVPB        2 g 200 mL/hr over 30 Minutes Intravenous  Once 05/21/23 0148 05/21/23 0307   05/21/23 0200  vancomycin (VANCOREADY) IVPB 1750 mg/350 mL        1,750 mg 175 mL/hr over 120 Minutes Intravenous  Once 05/21/23 0156 05/21/23 0543           Data Reviewed:  I have personally reviewed the following...  CBC: Recent Labs  Lab 05/20/23 2017 05/22/23 0518  WBC 16.2* 5.6  NEUTROABS 14.8*  --   HGB 12.7* 11.2*  HCT 38.1* 34.4*  MCV 101.3* 102.1*  PLT 102* 95*   Basic Metabolic Panel: Recent Labs  Lab 05/20/23 2017 05/22/23 0518  NA 142 147*  K 4.1 3.3*  CL 108 115*  CO2 22 22  GLUCOSE 122* 86  BUN 35* 35*  CREATININE 0.70 0.87  CALCIUM 9.1 9.1   GFR: Estimated Creatinine Clearance: 61.7 mL/min (by C-G formula based on SCr of 0.87 mg/dL). Liver Function Tests: Recent Labs  Lab 05/20/23 2017  AST 19  ALT 16  ALKPHOS 105  BILITOT 0.7  PROT 7.2  ALBUMIN 3.2*   No results for input(s): "LIPASE", "AMYLASE" in the last 168 hours. No results for input(s): "AMMONIA" in the last 168 hours. Coagulation Profile: Recent Labs  Lab 05/20/23 2017  INR 1.1   Cardiac Enzymes: Recent Labs  Lab 05/21/23 1001  CKTOTAL 159   BNP (last 3 results) No results for input(s): "PROBNP" in the last 8760 hours. HbA1C: No results for input(s): "HGBA1C" in the last 72 hours. CBG: No results for input(s): "GLUCAP" in the last 168 hours. Lipid Profile: No results for input(s): "CHOL",  "HDL", "LDLCALC", "TRIG", "CHOLHDL", "LDLDIRECT" in the last 72 hours. Thyroid Function Tests: Recent Labs    05/21/23 1001  TSH 3.371   Anemia Panel: Recent Labs    05/21/23 1001  VITAMINB12 592   Most Recent Urinalysis On File:     Component Value Date/Time   COLORURINE YELLOW (A) 05/21/2023 0200   APPEARANCEUR CLOUDY (A) 05/21/2023 0200   APPEARANCEUR Cloudy (A) 01/05/2022 1622   LABSPEC 1.013 05/21/2023 0200   LABSPEC 1.012 05/25/2013  1506   PHURINE 5.0 05/21/2023 0200   GLUCOSEU NEGATIVE 05/21/2023 0200   GLUCOSEU Negative 05/25/2013 1506   HGBUR LARGE (A) 05/21/2023 0200   BILIRUBINUR NEGATIVE 05/21/2023 0200   BILIRUBINUR Negative 01/05/2022 1622   BILIRUBINUR Negative 05/25/2013 1506   KETONESUR NEGATIVE 05/21/2023 0200   PROTEINUR 100 (A) 05/21/2023 0200   UROBILINOGEN 0.2 10/03/2018 1032   NITRITE NEGATIVE 05/21/2023 0200   LEUKOCYTESUR LARGE (A) 05/21/2023 0200   LEUKOCYTESUR Negative 05/25/2013 1506   Sepsis Labs: @LABRCNTIP (procalcitonin:4,lacticidven:4) Microbiology: Recent Results (from the past 240 hours)  Culture, blood (Routine x 2)     Status: None (Preliminary result)   Collection Time: 05/20/23  8:17 PM   Specimen: BLOOD RIGHT HAND  Result Value Ref Range Status   Specimen Description   Final    BLOOD RIGHT HAND Performed at [facility], [address] [place], [place] [address]    Special Requests   Final    BOTTLES DRAWN AEROBIC AND ANAEROBIC Blood Culture results may not be optimal due to an inadequate volume of blood received in culture bottles Performed at [facility], [address] [place], [place] [address]    Culture  Setup Time   Final    GRAM NEGATIVE RODS IN BOTH AEROBIC AND ANAEROBIC BOTTLES CRITICAL RESULT CALLED TO, READ BACK BY AND VERIFIED WITHPaschal Dopp PHARMD 1003 05/21/23 HNM Performed at [facility], [address] [place], [place] [address]    Culture GRAM NEGATIVE RODS  Final    Report Status PENDING  Incomplete  Blood Culture ID Panel (Reflexed)     Status: Abnormal   Collection Time: 05/20/23  8:17 PM  Result Value Ref Range Status   Enterococcus faecalis NOT DETECTED NOT DETECTED Final   Enterococcus Faecium NOT DETECTED NOT DETECTED Final   Listeria monocytogenes NOT DETECTED NOT DETECTED Final   Staphylococcus species NOT DETECTED NOT DETECTED Final   Staphylococcus aureus (BCID) NOT DETECTED NOT DETECTED Final   Staphylococcus epidermidis NOT DETECTED NOT DETECTED Final   Staphylococcus lugdunensis NOT DETECTED NOT DETECTED Final   Streptococcus species NOT DETECTED NOT DETECTED Final   Streptococcus agalactiae NOT DETECTED NOT DETECTED Final   Streptococcus pneumoniae NOT DETECTED NOT DETECTED Final   Streptococcus pyogenes NOT DETECTED NOT DETECTED Final   A.calcoaceticus-baumannii NOT DETECTED NOT DETECTED Final   Bacteroides fragilis NOT DETECTED NOT DETECTED Final   Enterobacterales DETECTED (A) NOT DETECTED Final    Comment: Enterobacterales represent a large order of gram negative bacteria, not a single organism. CRITICAL RESULT CALLED TO, READ BACK BY AND VERIFIED WITH: Paschal Dopp PHARMD 1003 05/21/23 HNM    Enterobacter cloacae complex NOT DETECTED NOT DETECTED Final   Escherichia coli NOT DETECTED NOT DETECTED Final   Klebsiella aerogenes NOT DETECTED NOT DETECTED Final   Klebsiella oxytoca NOT DETECTED NOT DETECTED Final   Klebsiella pneumoniae DETECTED (A) NOT DETECTED Final    Comment: CRITICAL RESULT CALLED TO, READ BACK BY AND VERIFIED WITH: Paschal Dopp PHARMD 1003 05/21/23 HNM    Proteus species NOT DETECTED NOT DETECTED Final   Salmonella species NOT DETECTED NOT DETECTED Final   Serratia marcescens NOT DETECTED NOT DETECTED Final   Haemophilus influenzae NOT DETECTED NOT DETECTED Final   Neisseria meningitidis NOT DETECTED NOT DETECTED Final   Pseudomonas aeruginosa NOT DETECTED NOT DETECTED Final   Stenotrophomonas maltophilia  NOT DETECTED NOT DETECTED Final   Candida albicans NOT DETECTED NOT DETECTED Final   Candida auris NOT DETECTED NOT DETECTED  Final   Candida glabrata NOT DETECTED NOT DETECTED Final   Candida krusei NOT DETECTED NOT DETECTED Final   Candida parapsilosis NOT DETECTED NOT DETECTED Final   Candida tropicalis NOT DETECTED NOT DETECTED Final   Cryptococcus neoformans/gattii NOT DETECTED NOT DETECTED Final   CTX-M ESBL NOT DETECTED NOT DETECTED Final   Carbapenem resistance IMP NOT DETECTED NOT DETECTED Final   Carbapenem resistance KPC NOT DETECTED NOT DETECTED Final   Carbapenem resistance NDM NOT DETECTED NOT DETECTED Final   Carbapenem resist OXA 48 LIKE NOT DETECTED NOT DETECTED Final   Carbapenem resistance VIM NOT DETECTED NOT DETECTED Final    Comment: Performed at [facility], [address] [place], [place] [address]  Culture, blood (Routine x 2)     Status: None (Preliminary result)   Collection Time: 05/21/23  2:00 AM   Specimen: BLOOD RIGHT ARM  Result Value Ref Range Status   Specimen Description   Final    BLOOD RIGHT ARM Performed at [facility], [address] [place], [place] [address]    Special Requests   Final    BOTTLES DRAWN AEROBIC AND ANAEROBIC Blood Culture adequate volume Performed at [facility], [address] [place], [place] [address]    Culture  Setup Time   Final    GRAM NEGATIVE RODS AEROBIC BOTTLE ONLY CRITICAL RESULT CALLED TO, READ BACK BY AND VERIFIED WITHVirgil Benedict [facility] 9147 05/22/23 HNM Performed at [facility], [address], [place], [place] [address]    Culture GRAM NEGATIVE RODS  Final   Report Status PENDING  Incomplete  Resp panel by RT-PCR (RSV, Flu A&B, Covid) Anterior Nasal Swab     Status: None   Collection Time: 05/21/23  2:00 AM   Specimen: Anterior Nasal Swab  Result Value Ref Range Status   SARS Coronavirus 2 by RT PCR NEGATIVE NEGATIVE Final    Comment: (NOTE) SARS-CoV-2  target nucleic acids are NOT DETECTED.  The SARS-CoV-2 RNA is generally detectable in upper respiratory specimens during the acute phase of infection. The lowest concentration of SARS-CoV-2 viral copies this assay can detect is 138 copies/mL. A negative result does not preclude SARS-Cov-2 infection and should not be used as the sole basis for treatment or other patient management decisions. A negative result may occur with  improper specimen collection/handling, submission of specimen other than nasopharyngeal swab, presence of viral mutation(s) within the areas targeted by this assay, and inadequate number of viral copies(<138 copies/mL). A negative result must be combined with clinical observations, patient history, and epidemiological information. The expected result is Negative.  Fact Sheet for Patients:  BloggerCourse.com  Fact Sheet for Healthcare Providers:  SeriousBroker.it  This test is no t yet approved or cleared by the [place] FDA and  has been authorized for detection and/or diagnosis of SARS-CoV-2 by FDA under an Emergency Use Authorization (EUA). This EUA will remain  in effect (meaning this test can be used) for the duration of the COVID-19 declaration under Section 564(b)(1) of the Act, 21 U.S.C.section 360bbb-3(b)(1), unless the authorization is terminated  or revoked sooner.       Influenza A by PCR NEGATIVE NEGATIVE Final   Influenza B by PCR NEGATIVE NEGATIVE Final    Comment: (NOTE) The Xpert Xpress SARS-CoV-2/FLU/RSV plus assay is intended as an aid in the diagnosis of influenza from Nasopharyngeal swab specimens and should not be used as a sole basis for treatment. Nasal washings and aspirates are unacceptable for  Xpert Xpress SARS-CoV-2/FLU/RSV testing.  Fact Sheet for Patients: BloggerCourse.com  Fact Sheet for Healthcare  Providers: SeriousBroker.it  This test is not yet approved or cleared by the [place] FDA and has been authorized for detection and/or diagnosis of SARS-CoV-2 by FDA under an Emergency Use Authorization (EUA). This EUA will remain in effect (meaning this test can be used) for the duration of the COVID-19 declaration under Section 564(b)(1) of the Act, 21 U.S.C. section 360bbb-3(b)(1), unless the authorization is terminated or revoked.     Resp Syncytial Virus by PCR NEGATIVE NEGATIVE Final    Comment: (NOTE) Fact Sheet for Patients: BloggerCourse.com  Fact Sheet for Healthcare Providers: SeriousBroker.it  This test is not yet approved or cleared by the [place] FDA and has been authorized for detection and/or diagnosis of SARS-CoV-2 by FDA under an Emergency Use Authorization (EUA). This EUA will remain in effect (meaning this test can be used) for the duration of the COVID-19 declaration under Section 564(b)(1) of the Act, 21 U.S.C. section 360bbb-3(b)(1), unless the authorization is terminated or revoked.  Performed at [facility], [address] [place], [place] [address]   Urine Culture     Status: Abnormal (Preliminary result)   Collection Time: 05/21/23  2:00 AM   Specimen: Urine, Random  Result Value Ref Range Status   Specimen Description   Final    URINE, RANDOM Performed at [facility], [address] [place], [place] [address]    Special Requests   Final    NONE Reflexed from [phone] Performed at [facility], [address] [place], [place] [address]    Culture >=100,000 COLONIES/mL KLEBSIELLA PNEUMONIAE (A)  Final   Report Status PENDING  Incomplete      Radiology Studies last 3 days: US RENAL Result Date: 05/21/2023 CLINICAL DATA:  99105 Renal stone 99105 EXAM: RENAL / URINARY TRACT ULTRASOUND COMPLETE COMPARISON:  Ultrasound 05/04/2022,  CT 01/06/2022 FINDINGS: Right Kidney: Renal measurements: 9.6 x 5.9 x 5.4 cm = volume: 163 mL. Echogenicity within normal limits. Multiple echogenic shadowing stones within the right kidney measuring between 0.8 and 1.7 cm in size. No hydronephrosis. Unchanged renal cysts. No solid renal mass. Left Kidney: Renal measurements: 11.9 x 6.8 x 5.7 cm = volume: 242 mL. Echogenicity within normal limits. Unchanged renal cyst. No solid renal mass. No stone or hydronephrosis. Bladder: Decompressed by Foley catheter. Other: None. IMPRESSION: 1. Right nephrolithiasis. No hydronephrosis. 2. Unchanged bilateral renal cysts which do not require any dedicated follow-up imaging. Electronically Signed   By: Duanne Guess D.O.   On: 05/21/2023 12:36   DG Chest Port 1 View Result Date: 05/20/2023 CLINICAL DATA:  Shortness of breath.  Fever. EXAM: PORTABLE CHEST 1 VIEW COMPARISON:  01/12/2023 FINDINGS: Low lung volumes. Stable heart size and mediastinal contours. Improved right perihilar opacity from prior. Minimal ill-defined bibasilar atelectasis. No pulmonary edema, large pleural effusion or pneumothorax. The bones are under mineralized. IMPRESSION: Low lung volumes with bibasilar atelectasis. Electronically Signed   By: Narda Rutherford M.D.   On: 05/20/2023 21:10        Sunnie Nielsen, DO [facility] 05/22/2023, 6:40 PM    Dictation software may have been used to generate the above note. Typos may occur and escape review in typed/dictated notes. Please contact Dr Lyn Hollingshead directly for clarity if needed.  Staff may message me via secure chat in Epic  but this may not receive an immediate response,  please page me for urgent matters!  If  7PM-7AM, please contact night coverage [email or web address]

## 2023-05-23 DIAGNOSIS — N390 Urinary tract infection, site not specified: Secondary | ICD-10-CM | POA: Diagnosis not present

## 2023-05-23 DIAGNOSIS — A4150 Gram-negative sepsis, unspecified: Secondary | ICD-10-CM | POA: Diagnosis not present

## 2023-05-23 LAB — BASIC METABOLIC PANEL
Anion gap: 10 (ref 5–15)
BUN: 41 mg/dL — ABNORMAL HIGH (ref 8–23)
CO2: 23 mmol/L (ref 22–32)
Calcium: 8.9 mg/dL (ref 8.9–10.3)
Chloride: 117 mmol/L — ABNORMAL HIGH (ref 98–111)
Creatinine, Ser: 0.7 mg/dL (ref 0.61–1.24)
GFR, Estimated: >60 mL/min (ref >60)
Glucose, Bld: 92 mg/dL (ref 70–99)
Potassium: 3.2 mmol/L — ABNORMAL LOW (ref 3.5–5.1)
Sodium: 150 mmol/L — ABNORMAL HIGH (ref 135–145)

## 2023-05-23 LAB — FOLATE: Folate: 29 ng/mL (ref >5.9)

## 2023-05-23 LAB — CBC
HCT: 33.5 % — ABNORMAL LOW (ref 39.0–52.0)
Hemoglobin: 11.1 g/dL — ABNORMAL LOW (ref 13.0–17.0)
MCH: 33.2 pg (ref 26.0–34.0)
MCHC: 33.1 g/dL (ref 30.0–36.0)
MCV: 100.3 fL — ABNORMAL HIGH (ref 80.0–100.0)
Platelets: 108 K/uL — ABNORMAL LOW (ref 150–400)
RBC: 3.34 MIL/uL — ABNORMAL LOW (ref 4.22–5.81)
RDW: 14.6 % (ref 11.5–15.5)
WBC: 4.2 K/uL (ref 4.0–10.5)
nRBC: 0 % (ref 0.0–0.2)

## 2023-05-23 LAB — VITAMIN B12: Vitamin B-12: 569 pg/mL (ref 180–914)

## 2023-05-23 MED ORDER — POTASSIUM CHLORIDE CRYS ER 20 MEQ PO TBCR
40.0000 meq | EXTENDED_RELEASE_TABLET | Freq: Two times a day (BID) | ORAL | Status: AC
  Administered 2023-05-23 – 2023-05-24 (×2): 40 meq via ORAL
  Filled 2023-05-23 (×2): qty 2

## 2023-05-23 MED ORDER — MEDIHONEY WOUND/BURN DRESSING EX PSTE
1.0000 | PASTE | Freq: Every day | CUTANEOUS | Status: DC
  Administered 2023-05-23 – 2023-05-25 (×3): 1 via TOPICAL
  Filled 2023-05-23: qty 44

## 2023-05-23 NOTE — Progress Notes (Signed)
[facility] Liaison Note:  Received a message from [facility] Palliative care NP that [name] will not be able to take patient back unless family is agreeable to Hospice follow up.  [name] will follow up with patient's niece to discuss this.    Please don't hesitate to call with any Hospice related questions or concerns.    Thank you for the opportunity to participate in this patient's care.  [facility] Liaison [phone]

## 2023-05-23 NOTE — Progress Notes (Signed)
PROGRESS NOTE  [name]    DOB: [birth year], 83 y.o.  WUJ:[id number]    Code Status: Limited: Do not attempt resuscitation (DNR) -DNR-LIMITED -Do Not Intubate/DNI    DOA: 05/21/2023   LOS: 2   Brief hospital course  [name] is a 83 y.o. male with medical history significant of intellectual disability, BPH, HLD, wheelchair dependent sent from group home for evaluation strong smell of urine and worsening of baseline confusion over last few days. ED course: temperature 102.5.   WBC 16.2, UA microscopic hematuria as well as pyuria. Patient was started on vancomycin and cefepime in the ED. Admitted to hospitalist. (+)klebsiella bacteremia d/t UTI 01/28: repeat BCx   05/23/23 -now on CTX for klebsiella UTI, bacteremia. UxCx sensitivities returned. Also enterococcus faecalis +  Assessment & Plan  Principal Problem:   Sepsis due to gram-negative UTI [facility]) Active Problems:   Acute metabolic encephalopathy   Encephalopathy  Sepsis, severe, with acute endorgan damage Recurrent UTI Klebsiella P.  Bacteremia Sepsis evidenced by new onset of fever, leukocytosis, source of infection is recurrent UTI.  Acute endorgan damage including acute encephalopathy. Renal US showed R nephrolithiasis without hydronephrosis. Unchanged bilateral renal cysts.  Continue ceftriaxone and transition to PO once culture results final f/u cultures    Acute metabolic encephalopathy Secondary to sepsis No focal neurological deficit, hold off further brain imaging at this point   Hypokalemia Replace as needed Monitor BMP Check magnesium am   Macrocytic anemia, chronic Baseline Follow CBC, B12, Folate    History of BPH On alpha 1 receptor antagonist Checking post-void residual and renal US  Will need outpatient urology follow-up for urodynamic study there has been 3 episodes of recurrent UTI within 1 month Remove foley for void trial  Body mass index is 21.58 kg/m.  VTE ppx: enoxaparin  (LOVENOX) injection 40 mg Start: 05/21/23 1400  Diet:     Diet   DIET - DYS 1 Room service appropriate? Yes with Assist; Fluid consistency: Nectar Thick   Consultants: Palliative   Subjective 05/23/23    Pt reports feeling "not bad". Denies suprapubic pain. Denies pain overall. Denies any questions for me. Minimally conversant    Objective   Vitals:   05/22/23 0513 05/22/23 0813 05/22/23 1700 05/22/23 2041  BP: (!) 134/109 105/77 120/73 94/62  Pulse: 73 68 73 75  Resp: 20  19 18   Temp: 97.8 F (36.6 C) 98.2 F (36.8 C) 99.3 F (37.4 C) 98.6 F (37 C)  TempSrc:  Oral    SpO2: 95% 95% 93% 95%  Weight:      Height:        Intake/Output Summary (Last 24 hours) at 05/23/2023 0823 Last data filed at 05/23/2023 0342 Gross per 24 hour  Intake 100 ml  Output 450 ml  Net -350 ml   Filed Weights   05/20/23 2013 05/21/23 1734  Weight: 70 kg 64.4 kg     Physical Exam:  Constitutional:      General: He is not in acute distress. Cardiovascular:     Rate and Rhythm: Normal rate and regular rhythm.  Pulmonary:     Effort: Pulmonary effort is normal.     Breath sounds: Normal breath sounds.  Musculoskeletal:     Right lower leg: No edema.     Left lower leg: No edema.  Neurological:     Mental Status: He is alert. Mental status is at baseline.  Labs   I have personally reviewed the following labs  and imaging studies CBC    Component Value Date/Time   WBC 4.2 05/23/2023 0427   RBC 3.34 (L) 05/23/2023 0427   HGB 11.1 (L) 05/23/2023 0427   HGB 10.9 (L) 06/11/2020 1120   HCT 33.5 (L) 05/23/2023 0427   HCT 31.8 (L) 06/11/2020 1120   PLT 108 (L) 05/23/2023 0427   PLT 140 (L) 06/11/2020 1120   MCV 100.3 (H) 05/23/2023 0427   MCV 90 06/11/2020 1120   MCV 96 08/14/2013 1618   MCH 33.2 05/23/2023 0427   MCHC 33.1 05/23/2023 0427   RDW 14.6 05/23/2023 0427   RDW 14.3 06/11/2020 1120   RDW 12.9 08/14/2013 1618   LYMPHSABS 0.5 (L) 05/20/2023 2017   LYMPHSABS 0.9  02/11/2015 1404   MONOABS 0.7 05/20/2023 2017   EOSABS 0.0 05/20/2023 2017   EOSABS 0.0 02/11/2015 1404   BASOSABS 0.0 05/20/2023 2017   BASOSABS 0.0 02/11/2015 1404      Latest Ref Rng & Units 05/23/2023    4:27 AM 05/22/2023    5:18 AM 05/20/2023    8:17 PM  BMP  Glucose 70 - 99 mg/dL 92  86  086   BUN 8 - 23 mg/dL 41  35  35   Creatinine 0.61 - 1.24 mg/dL 5.78  4.69  6.29   Sodium 135 - 145 mmol/L 150  147  142   Potassium 3.5 - 5.1 mmol/L 3.2  3.3  4.1   Chloride 98 - 111 mmol/L 117  115  108   CO2 22 - 32 mmol/L 23  22  22    Calcium 8.9 - 10.3 mg/dL 8.9  9.1  9.1     US RENAL Result Date: 05/21/2023 CLINICAL DATA:  99105 Renal stone [id number] EXAM: RENAL / URINARY TRACT ULTRASOUND COMPLETE COMPARISON:  Ultrasound 05/04/2022, CT 01/06/2022 FINDINGS: Right Kidney: Renal measurements: 9.6 x 5.9 x 5.4 cm = volume: 163 mL. Echogenicity within normal limits. Multiple echogenic shadowing stones within the right kidney measuring between 0.8 and 1.7 cm in size. No hydronephrosis. Unchanged renal cysts. No solid renal mass. Left Kidney: Renal measurements: 11.9 x 6.8 x 5.7 cm = volume: 242 mL. Echogenicity within normal limits. Unchanged renal cyst. No solid renal mass. No stone or hydronephrosis. Bladder: Decompressed by Foley catheter. Other: None. IMPRESSION: 1. Right nephrolithiasis. No hydronephrosis. 2. Unchanged bilateral renal cysts which do not require any dedicated follow-up imaging. Electronically Signed   By: Duanne Guess D.O.   On: 05/21/2023 12:36    Disposition Plan & Communication  Patient status: Inpatient  Admitted From: Group home Planned disposition location: Group home Anticipated discharge date: 1/30 pending culture results  Family Communication: none    Author: Leeroy Bock, DO [facility] 05/23/2023, 8:23 AM   Available by Epic secure chat 7AM-7PM. If 7PM-7AM, please contact night-coverage.  [facility] contact information found on [email or web address].

## 2023-05-23 NOTE — Consult Note (Addendum)
WOC Nurse Consult Note: Reason for Consult: Consult requested for bilat buttocks.  Pt has dark red-purple Deep tissue pressure injuries across bilat buttocks and sacrum in a butterfly pattern, beginning to evolve to Unstageable at wound edges.   Left buttock 2X2cm,  Right buttock 6X5cm.  Left lower buttock with Stage 2 pressure injury, 1X1X.1cm, red and moist.  Pressure Injury POA: Yes Pt is frequently incontinent of large thick pasty stool which are becoming trapped underneath the sacral dressing.  Cleaned and changed patient. Topical treatment orders provided for bedside nurses to perform as follows to assist with removal of nonviable tissue: Apply Medihoney to bilat buttocks/sacrum Q day, then cover with medium sized foam dressing (Not sacal dressing, it is trapping stool underneath).  Change foam dressing Q 3 days or PRN soiling. Please re-consult if further assistance is needed.  Thank-you,  Cammie Mcgee MSN, RN, CWOCN, [place], CNS [phone]

## 2023-05-23 NOTE — TOC Initial Note (Signed)
Transition of Care [facility]) - Initial/Assessment Note    Patient Details  Name: [name] MRN: [id number] Date of Birth: January 10, 1941  Transition of Care [facility]) CM/SW Contact:    Allena Katz, LCSW Phone Number: 05/23/2023, 2:59 PM  Clinical Narrative:         Pt plans to discharge back to [facility]. CSW following for care plan updates and needs.           Expected Discharge Plan: Group Home Barriers to Discharge: Continued Medical Work up   Patient Goals and CMS Choice            Expected Discharge Plan and Services       Living arrangements for the past 2 months: Group Home                                      Prior Living Arrangements/Services Living arrangements for the past 2 months: Group Home Lives with:: Facility Resident          Need for Family Participation in Patient Care: Yes (Comment) Care giver support system in place?: Yes (comment) Current home services: DME    Activities of Daily Living   ADL Screening (condition at time of admission) Independently performs ADLs?: No Does the patient have a NEW difficulty with bathing/dressing/toileting/self-feeding that is expected to last >3 days?: No Does the patient have a NEW difficulty with getting in/out of bed, walking, or climbing stairs that is expected to last >3 days?: No Does the patient have a NEW difficulty with communication that is expected to last >3 days?: No Is the patient deaf or have difficulty hearing?: No Does the patient have difficulty seeing, even when wearing glasses/contacts?: No Does the patient have difficulty concentrating, remembering, or making decisions?: Yes  Permission Sought/Granted                  Emotional Assessment       Orientation: : Fluctuating Orientation (Suspected and/or reported Sundowners)      Admission diagnosis:  Encephalopathy [G93.40] Fever, unspecified fever cause [R50.9] Urinary tract infection  without hematuria, site unspecified [N39.0] Altered mental status, unspecified altered mental status type [R41.82] Sepsis due to gram-negative UTI (HCC) [A41.50, N39.0] Patient Active Problem List   Diagnosis Date Noted   Sepsis due to gram-negative UTI (HCC) 05/21/2023   Acute metabolic encephalopathy 05/21/2023   Encephalopathy 05/21/2023   Thrush 04/30/2023   Sacral decubitus ulcer, stage II (HCC) 04/26/2023   Diarrhea 04/26/2023   Dysphagia 04/25/2023   Thrombocytopenia (HCC) 04/25/2023   Hypokalemia 04/25/2023   Macrocytic anemia 04/25/2023   Severe sepsis (HCC) 04/22/2023   Acute respiratory failure with hypoxia (HCC) 04/22/2023   Bacteremia due to Proteus species 04/22/2023   UTI (urinary tract infection) 04/22/2023   Hypernatremia 04/22/2023   Nephrolithiasis 02/01/2022   Pressure injury of left heel, stage 2 (HCC) 02/01/2022   Atherosclerosis of aorta (HCC) 02/01/2022   History of iron deficiency anemia 02/01/2022   Urinary incontinence 05/26/2021   Hypotension 05/20/2021   History of rectal bleeding 05/09/2020   Hematochezia    BPH (benign prostatic hyperplasia)    Hyperlipidemia    Depression with anxiety    Personal history of urinary calculi 06/21/2019   PAD (peripheral artery disease) (HCC) 07/04/2017   Aortic valve calcification 07/04/2017   Coronary artery calcification 07/04/2017   Late effect acute polio 05/17/2016  Chronic constipation 11/19/2015   Mild major depression (HCC) 11/19/2015   Bacteriuria, chronic 09/11/2015   History of prostate cancer 03/02/2015   Amyotrophia 03/02/2015   Edema leg 03/02/2015   Cerebral palsy (HCC) 03/02/2015   Dyslipidemia 03/02/2015   Dermatitis, eczematoid 03/02/2015   Divergent squint 03/02/2015   H/O acute poliomyelitis 03/02/2015   Lymphedema 03/02/2015   Intellectual disability 03/02/2015   Hypertrophy of nail 03/02/2015   Chronic venous insufficiency 03/02/2015   Avitaminosis D 03/02/2015   Adynamia  03/02/2015   Dependent on wheelchair 03/02/2015   Renal cysts, acquired, bilateral 03/02/2015   At risk for falling 03/02/2015   Cerebral vascular disease 03/02/2015   Strabismus 02/11/2015   PCP:  Alba Cory, MD Pharmacy:   [facility] - [place], [place] - [address] [place] [address] Phone: [phone] Fax: [phone]     Social Drivers of Health (SDOH) Social History: SDOH Screenings   Food Insecurity: Patient Unable To Answer (05/23/2023)  Housing: Patient Unable To Answer (05/23/2023)  Transportation Needs: Patient Unable To Answer (05/23/2023)  Utilities: Patient Unable To Answer (05/23/2023)  Alcohol Screen: Low Risk  (03/15/2023)  Depression (PHQ2-9): Low Risk  (03/15/2023)  Financial Resource Strain: Low Risk  (09/29/2022)  Physical Activity: Inactive (09/29/2022)  Social Connections: Patient Unable To Answer (05/23/2023)  Stress: No Stress Concern Present (09/29/2022)  Tobacco Use: Low Risk  (05/20/2023)   SDOH Interventions:     Readmission Risk Interventions     No data to display

## 2023-05-23 NOTE — Progress Notes (Signed)
Foley d/c'd at 1700 with medium BM in bed.  Patient due to void at 0300.

## 2023-05-23 NOTE — Plan of Care (Signed)

## 2023-05-24 DIAGNOSIS — N390 Urinary tract infection, site not specified: Secondary | ICD-10-CM | POA: Diagnosis not present

## 2023-05-24 DIAGNOSIS — A4150 Gram-negative sepsis, unspecified: Secondary | ICD-10-CM | POA: Diagnosis not present

## 2023-05-24 LAB — BASIC METABOLIC PANEL
Anion gap: 6 (ref 5–15)
BUN: 41 mg/dL — ABNORMAL HIGH (ref 8–23)
CO2: 25 mmol/L (ref 22–32)
Calcium: 9 mg/dL (ref 8.9–10.3)
Chloride: 123 mmol/L — ABNORMAL HIGH (ref 98–111)
Creatinine, Ser: 0.62 mg/dL (ref 0.61–1.24)
GFR, Estimated: >60 mL/min (ref >60)
Glucose, Bld: 102 mg/dL — ABNORMAL HIGH (ref 70–99)
Potassium: 3.8 mmol/L (ref 3.5–5.1)
Sodium: 154 mmol/L — ABNORMAL HIGH (ref 135–145)

## 2023-05-24 LAB — CULTURE, BLOOD (ROUTINE X 2)
Culture  Setup Time: NO GROWTH
Special Requests: ADEQUATE

## 2023-05-24 LAB — URINE CULTURE: Culture: >=100000 — AB

## 2023-05-24 LAB — MAGNESIUM: Magnesium: 2.3 mg/dL (ref 1.7–2.4)

## 2023-05-24 MED ORDER — CHLORHEXIDINE GLUCONATE CLOTH 2 % EX PADS
6.0000 | MEDICATED_PAD | Freq: Every day | CUTANEOUS | Status: DC
Start: 2023-05-24 — End: 2023-05-24

## 2023-05-24 MED ORDER — SULFAMETHOXAZOLE-TRIMETHOPRIM 800-160 MG PO TABS
1.0000 | ORAL_TABLET | Freq: Two times a day (BID) | ORAL | Status: DC
  Administered 2023-05-25: 1 via ORAL
  Filled 2023-05-24 (×2): qty 1

## 2023-05-24 MED ORDER — SODIUM CHLORIDE 0.9 % IV BOLUS
1000.0000 mL | Freq: Once | INTRAVENOUS | Status: AC
  Administered 2023-05-24: 1000 mL via INTRAVENOUS

## 2023-05-24 NOTE — Progress Notes (Signed)
PROGRESS NOTE  [name]    DOB: May 11, 1940, 83 y.o.  ZOX:[id number]    Code Status: Limited: Do not attempt resuscitation (DNR) -DNR-LIMITED -Do Not Intubate/DNI    DOA: 05/21/2023   LOS: 3   Brief hospital course  [name] is a 83 y.o. male with medical history significant of intellectual disability, BPH, HLD, wheelchair dependent sent from group home for evaluation strong smell of urine and worsening of baseline confusion over last few days. ED course: temperature 102.5.   WBC 16.2, UA microscopic hematuria as well as pyuria. Patient was started on vancomycin and cefepime in the ED. Admitted to hospitalist. (+)klebsiella bacteremia d/t UTI 01/28: repeat BCx NGTD 1/29: transitioned to CTX for klebsiella UTI, bacteremia. UxCx sensitivities returned. Also enterococcus faecalis + colonization likely 05/24/23 -now on bactrim for completing 7 day course  Assessment & Plan  Principal Problem:   Sepsis due to gram-negative UTI [facility]) Active Problems:   Acute metabolic encephalopathy   Encephalopathy  Sepsis, severe, with acute endorgan damage- resolved Recurrent UTI Klebsiella P.  Bacteremia Sepsis evidenced by new onset of fever, leukocytosis, source of infection is recurrent UTI.  Acute endorgan damage including acute encephalopathy. Renal US showed R nephrolithiasis without hydronephrosis. Unchanged bilateral renal cysts.  Ceftriaxone > bactrim to complete 7 day course  Hypernatremia- worsening. From hypovolemia - IVmF - BMP am   Acute metabolic encephalopathy- appears to be back to baseline Secondary to sepsis No focal neurological deficit, hold off further brain imaging at this point   Hypokalemia- resolved. K+ 3.8, Mg++ 2.3. s/p replacement Replace as needed Monitor BMP   Macrocytic anemia, chronic Baseline   History of BPH On alpha 1 receptor antagonist Will need outpatient urology follow-up for urodynamic study there has been 3 episodes of recurrent UTI  within 1 month Remove foley for void trial Bladder scans BMP am  Body mass index is 21.58 kg/m.  VTE ppx: enoxaparin (LOVENOX) injection 40 mg Start: 05/21/23 1400  Diet:     Diet   DIET - DYS 1 Room service appropriate? Yes with Assist; Fluid consistency: Nectar Thick   Consultants: Palliative   Subjective 05/24/23    Pt reports no complaints today. He requests milk.    Objective   Vitals:   05/22/23 2041 05/23/23 0845 05/23/23 1632 05/24/23 0459  BP: 94/62 124/76 92/62 105/68  Pulse: 75 76 72 69  Resp: 18 17 18 18   Temp: 98.6 F (37 C) 99 F (37.2 C) 98.4 F (36.9 C) 98 F (36.7 C)  TempSrc:      SpO2: 95% 94% 96% 94%  Weight:      Height:        Intake/Output Summary (Last 24 hours) at 05/24/2023 0750 Last data filed at 05/23/2023 1700 Gross per 24 hour  Intake --  Output 600 ml  Net -600 ml   Filed Weights   05/20/23 2013 05/21/23 1734  Weight: 70 kg 64.4 kg     Physical Exam:  Constitutional:      General: He is not in acute distress. Cardiovascular:     Rate and Rhythm: Normal rate and regular rhythm.  Pulmonary:     Effort: Pulmonary effort is normal.     Breath sounds: Normal breath sounds.  Musculoskeletal:     Right lower leg: No edema.     Left lower leg: No edema.  Neurological:     Mental Status: He is alert. Mental status is at baseline.  Labs   I  have personally reviewed the following labs and imaging studies CBC    Component Value Date/Time   WBC 4.2 05/23/2023 0427   RBC 3.34 (L) 05/23/2023 0427   HGB 11.1 (L) 05/23/2023 0427   HGB 10.9 (L) 06/11/2020 1120   HCT 33.5 (L) 05/23/2023 0427   HCT 31.8 (L) 06/11/2020 1120   PLT 108 (L) 05/23/2023 0427   PLT 140 (L) 06/11/2020 1120   MCV 100.3 (H) 05/23/2023 0427   MCV 90 06/11/2020 1120   MCV 96 08/14/2013 1618   MCH 33.2 05/23/2023 0427   MCHC 33.1 05/23/2023 0427   RDW 14.6 05/23/2023 0427   RDW 14.3 06/11/2020 1120   RDW 12.9 08/14/2013 1618   LYMPHSABS 0.5 (L)  05/20/2023 2017   LYMPHSABS 0.9 02/11/2015 1404   MONOABS 0.7 05/20/2023 2017   EOSABS 0.0 05/20/2023 2017   EOSABS 0.0 02/11/2015 1404   BASOSABS 0.0 05/20/2023 2017   BASOSABS 0.0 02/11/2015 1404      Latest Ref Rng & Units 05/24/2023    5:05 AM 05/23/2023    4:27 AM 05/22/2023    5:18 AM  BMP  Glucose 70 - 99 mg/dL 161  92  86   BUN 8 - 23 mg/dL 41  41  35   Creatinine 0.61 - 1.24 mg/dL 0.96  0.45  4.09   Sodium 135 - 145 mmol/L 154  150  147   Potassium 3.5 - 5.1 mmol/L 3.8  3.2  3.3   Chloride 98 - 111 mmol/L 123  117  115   CO2 22 - 32 mmol/L 25  23  22    Calcium 8.9 - 10.3 mg/dL 9.0  8.9  9.1     No results found.   Disposition Plan & Communication  Patient status: Inpatient  Admitted From: Group home Planned disposition location: Group home Anticipated discharge date: 1/31 pending return to facility   Family Communication: none    Author: Leeroy Bock, DO [facility] 05/24/2023, 7:50 AM   Available by Epic secure chat 7AM-7PM. If 7PM-7AM, please contact night-coverage.  [facility] contact information found on [email or web address].

## 2023-05-24 NOTE — Care Management Important Message (Signed)
Important Message  Patient Details  Name: [name] MRN: [id number] Date of Birth: [birth year]   Important Message Given:  Yes - Medicare IM     Cristela Blue, CMA 05/24/2023, 10:36 AM

## 2023-05-25 DIAGNOSIS — R509 Fever, unspecified: Secondary | ICD-10-CM | POA: Diagnosis not present

## 2023-05-25 DIAGNOSIS — R4182 Altered mental status, unspecified: Secondary | ICD-10-CM

## 2023-05-25 DIAGNOSIS — F419 Anxiety disorder, unspecified: Secondary | ICD-10-CM

## 2023-05-25 DIAGNOSIS — L8990 Pressure ulcer of unspecified site, unspecified stage: Secondary | ICD-10-CM | POA: Insufficient documentation

## 2023-05-25 DIAGNOSIS — A4150 Gram-negative sepsis, unspecified: Secondary | ICD-10-CM | POA: Diagnosis not present

## 2023-05-25 LAB — BASIC METABOLIC PANEL
Anion gap: 8 (ref 5–15)
BUN: 32 mg/dL — ABNORMAL HIGH (ref 8–23)
CO2: 25 mmol/L (ref 22–32)
Calcium: 8.7 mg/dL — ABNORMAL LOW (ref 8.9–10.3)
Chloride: 121 mmol/L — ABNORMAL HIGH (ref 98–111)
Creatinine, Ser: 0.51 mg/dL — ABNORMAL LOW (ref 0.61–1.24)
GFR, Estimated: >60 mL/min (ref >60)
Glucose, Bld: 91 mg/dL (ref 70–99)
Potassium: 3.7 mmol/L (ref 3.5–5.1)
Sodium: 154 mmol/L — ABNORMAL HIGH (ref 135–145)

## 2023-05-25 MED ORDER — MEDIHONEY WOUND/BURN DRESSING EX PSTE: 1.0000 | PASTE | Freq: Every day | CUTANEOUS | Status: DC

## 2023-05-25 MED ORDER — SULFAMETHOXAZOLE-TRIMETHOPRIM 800-160 MG PO TABS: 1.0000 | ORAL_TABLET | Freq: Two times a day (BID) | ORAL | Status: AC

## 2023-05-25 MED ORDER — ALPRAZOLAM 0.25 MG PO TABS: 0.2500 mg | ORAL_TABLET | Freq: Every day | ORAL | 0 refills | Status: AC | PRN

## 2023-05-25 NOTE — Plan of Care (Signed)
  Problem: Education: Goal: Knowledge of General Education information will improve Description: Including pain rating scale, medication(s)/side effects and non-pharmacologic comfort measures Outcome: Adequate for Discharge   Problem: Health Behavior/Discharge Planning: Goal: Ability to manage health-related needs will improve Outcome: Adequate for Discharge   Problem: Clinical Measurements: Goal: Ability to maintain clinical measurements within normal limits will improve Outcome: Adequate for Discharge Goal: Will remain free from infection Outcome: Adequate for Discharge Goal: Diagnostic test results will improve Outcome: Adequate for Discharge Goal: Respiratory complications will improve Outcome: Adequate for Discharge Goal: Cardiovascular complication will be avoided Outcome: Adequate for Discharge   Problem: Activity: Goal: Risk for activity intolerance will decrease Outcome: Adequate for Discharge   Problem: Nutrition: Goal: Adequate nutrition will be maintained Outcome: Adequate for Discharge   Problem: Coping: Goal: Level of anxiety will decrease Outcome: Adequate for Discharge   Problem: Elimination: Goal: Will not experience complications related to bowel motility Outcome: Adequate for Discharge Goal: Will not experience complications related to urinary retention Outcome: Adequate for Discharge   Problem: Pain Managment: Goal: General experience of comfort will improve and/or be controlled Outcome: Adequate for Discharge   Problem: Safety: Goal: Ability to remain free from injury will improve Outcome: Adequate for Discharge   Problem: Skin Integrity: Goal: Risk for impaired skin integrity will decrease Outcome: Adequate for Discharge   Problem: Acute Rehab OT Goals (only OT should resolve) Goal: Pt. Will Perform Eating Outcome: Adequate for Discharge Goal: Pt. Will Perform Grooming Outcome: Adequate for Discharge Goal: Pt. Will Perform Upper Body  Bathing Outcome: Adequate for Discharge

## 2023-05-25 NOTE — TOC Transition Note (Addendum)
Transition of Care [facility]) - Discharge Note   Patient Details  Name: [name] MRN: [id number] Date of Birth: [birth year]  Transition of Care [facility]) CM/SW Contact:  Allena Katz, LCSW Phone Number: 05/25/2023, 12:05 PM   Clinical Narrative:   Pt discharging back to group home. Dc summary and fl2 emailed to [facility]. Lupita Leash to pick up patient around 3pm. [facility] aware.       Barriers to Discharge: Barriers Resolved   Patient Goals and CMS Choice Patient states their goals for this hospitalization and ongoing recovery are:: go back to group home CMS Medicare.gov Compare Post Acute Care list provided to:: Patient        Discharge Placement                    Patient and family notified of of transfer: 05/25/23  Discharge Plan and Services Additional resources added to the After Visit Summary for                                       Social Drivers of Health (SDOH) Interventions SDOH Screenings   Food Insecurity: Patient Unable To Answer (05/23/2023)  Housing: Patient Unable To Answer (05/23/2023)  Transportation Needs: Patient Unable To Answer (05/23/2023)  Utilities: Patient Unable To Answer (05/23/2023)  Alcohol Screen: Low Risk  (03/15/2023)  Depression (PHQ2-9): Low Risk  (03/15/2023)  Financial Resource Strain: Low Risk  (09/29/2022)  Physical Activity: Inactive (09/29/2022)  Social Connections: Patient Unable To Answer (05/23/2023)  Stress: No Stress Concern Present (09/29/2022)  Tobacco Use: Low Risk  (05/20/2023)     Readmission Risk Interventions     No data to display

## 2023-05-25 NOTE — Progress Notes (Signed)
Approximately 1730--Patient discharged to [facility]. Report provided in-person to Group Home staff with all questions answered at this time. All PIVs removed with sites WDL. All pt belongings taken with pt--staff verified. AVS discharge packet provided to Group Home staff.

## 2023-05-25 NOTE — Discharge Summary (Signed)
Physician Discharge Summary  Patient: [name] ZOX:[id number] DOB: [birth year]   Code Status: Limited: Do not attempt resuscitation (DNR) -DNR-LIMITED -Do Not Intubate/DNI  Admit date: 05/21/2023 Discharge date: 05/25/2023 Disposition: Group home, PT, OT, SLP, nurse aid, RN, and wound care PCP: Alba Cory, MD  Recommendations for Outpatient Follow-up:  Follow up with PCP within 1-2 weeks Regarding general hospital follow up and preventative care  Discharge Diagnoses:  Principal Problem:   Sepsis due to gram-negative UTI (HCC) Active Problems:   Acute metabolic encephalopathy   Encephalopathy   Fever   Altered mental status   Anxiety   Pressure injury of skin  Brief Hospital Course Summary: [name] is a 83 y.o. male with medical history significant of intellectual disability, BPH, HLD, wheelchair dependent sent from group home for evaluation strong smell of urine and worsening of baseline confusion over last few days. ED course: temperature 102.5.   WBC 16.2, UA microscopic hematuria as well as pyuria. Patient was started on vancomycin and cefepime in the ED. Admitted to hospitalist for treatment of urosepsis.  Initial blood cultures and urine cultures (+)klebsiella bacteremia d/t UTI.  01/28: repeat BCx NGTD 1/29: transitioned to CTX for klebsiella UTI, bacteremia. UxCx sensitivities returned. Also enterococcus faecalis + colonization likely 1/30: He had been started on foley catheter in ED for close volume monitoring so removed today for voiding trial.  05/25/23 -now on bactrim for completing 7 day course. Last dose to be given 2/2. Has good spontaneous UOP without retention. Continues to be incontinent. Plan to dc home today.   Wound care instructions per WOC nurse: "Pt has dark red-purple Deep tissue pressure injuries across bilat buttocks and sacrum in a butterfly pattern, beginning to evolve to Unstageable at wound edges.   Left buttock 2X2cm,  Right  buttock 6X5cm.  Left lower buttock with Stage 2 pressure injury, 1X1X.1cm, red and moist.  Pressure Injury POA: Yes Pt is frequently incontinent of large thick pasty stool which are becoming trapped underneath the sacral dressing.  Cleaned and changed patient. Topical treatment orders provided for bedside nurses to perform as follows to assist with removal of nonviable tissue: Apply Medihoney to bilat buttocks/sacrum Q day, then cover with medium sized foam dressing (Not sacal dressing, it is trapping stool underneath).  Change foam dressing Q 3 days or PRN soiling."  All other chronic conditions were treated with home medications.    Discharge Condition: Good, improved Recommended discharge diet:  dysphagia 1 with nectar thick liquids  Consultations: None   Procedures/Studies: none  Allergies as of 05/25/2023   No Known Allergies      Medication List     STOP taking these medications    fluconazole 100 MG tablet Commonly known as: DIFLUCAN       TAKE these medications    acetaminophen 325 MG tablet Commonly known as: TYLENOL TAKE 2 TABLETS (650 MG TOTAL) BY MOUTH EVERY 6 HOURS AS NEEDED FOR MILD PAIN OR HEADACHE.   alfuzosin 10 MG 24 hr tablet Commonly known as: UROXATRAL Take 1 tablet (10 mg total) by mouth daily.   ALPRAZolam 0.25 MG tablet Commonly known as: XANAX Take 1 tablet (0.25 mg total) by mouth daily as needed for up to 2 days (agitation (crisis med)).   atorvastatin 40 MG tablet Commonly known as: LIPITOR Take 1 tablet (40 mg total) by mouth every evening.   augmented betamethasone dipropionate 0.05 % cream Commonly known as: DIPROLENE-AF APPLY TOPICALLY AS DIRECTED TO RASH  AS NEEDED   benzonatate 100 MG capsule Commonly known as: TESSALON Take 2 capsules (200 mg total) by mouth 2 (two) times daily as needed for cough.   Calcium + Vitamin D3 600-10 MG-MCG Tabs Generic drug: Calcium Carb-Cholecalciferol TAKE 1 TABLET BY MOUTH TWICE DAILY.    Depend Silhouette Briefs L/XL Misc 1 each by Does not apply route 2 (two) times daily as needed.   Ensure Original Liqd DRINK 1 BOTTLE BY MOUTH 3 TIMES DAILY   fluticasone 50 MCG/ACT nasal spray Commonly known as: FLONASE Place 2 sprays into both nostrils daily.   food thickener Powd Commonly known as: RESOURCE THICKENUP CLEAR Take 120 g by mouth as needed.   GNP Vitamin C 500 MG tablet Generic drug: ascorbic acid TAKE 1 TABLET BY MOUTH ONCE DAILY   guaiFENesin 600 MG 12 hr tablet Commonly known as: Mucinex Take 1 tablet (600 mg total) by mouth 2 (two) times daily as needed for cough.   leptospermum manuka honey Pste paste Apply 1 Application topically daily. Start taking on: May 26, 2023   nystatin cream Commonly known as: MYCOSTATIN APPLY 1 APPLICATION TOPICALLY 2 TIMES DAILY   pantoprazole 40 MG tablet Commonly known as: PROTONIX TAKE 1 TABLET BY MOUTH ONCE EVERY DAY   sertraline 100 MG tablet Commonly known as: ZOLOFT Take 1 tablet (100 mg total) by mouth daily.   sulfamethoxazole-trimethoprim 800-160 MG tablet Commonly known as: BACTRIM DS Take 1 tablet by mouth every 12 (twelve) hours for 2 days.   SYSTANE ULTRA OP Place 1 drop into both eyes 2 (two) times daily as needed (dry eyes).       Subjective   Pt reports doing well. Denies pain or concerns. Group home staff at bedside to evaluate.   All questions and concerns were addressed at time of discharge.  Objective  Blood pressure 124/77, pulse 63, temperature 97.6 F (36.4 C), temperature source Oral, resp. rate 17, height 5\' 8"  (1.727 m), weight 64.4 kg, SpO2 94%.   General: Pt is alert, awake, not in acute distress Cardiovascular: RRR, S1/S2 +, no rubs, no gallops Respiratory: CTA bilaterally, no wheezing, no rhonchi Abdominal: Soft, NT, ND Extremities: no edema, no cyanosis. Muscle wasting diffusely   The results of significant diagnostics from this hospitalization (including imaging,  microbiology, ancillary and laboratory) are listed below for reference.   Imaging studies: US RENAL Result Date: 05/21/2023 CLINICAL DATA:  99105 Renal stone [id number] EXAM: RENAL / URINARY TRACT ULTRASOUND COMPLETE COMPARISON:  Ultrasound 05/04/2022, CT 01/06/2022 FINDINGS: Right Kidney: Renal measurements: 9.6 x 5.9 x 5.4 cm = volume: 163 mL. Echogenicity within normal limits. Multiple echogenic shadowing stones within the right kidney measuring between 0.8 and 1.7 cm in size. No hydronephrosis. Unchanged renal cysts. No solid renal mass. Left Kidney: Renal measurements: 11.9 x 6.8 x 5.7 cm = volume: 242 mL. Echogenicity within normal limits. Unchanged renal cyst. No solid renal mass. No stone or hydronephrosis. Bladder: Decompressed by Foley catheter. Other: None. IMPRESSION: 1. Right nephrolithiasis. No hydronephrosis. 2. Unchanged bilateral renal cysts which do not require any dedicated follow-up imaging. Electronically Signed   By: Duanne Guess D.O.   On: 05/21/2023 12:36   DG Chest Port 1 View Result Date: 05/20/2023 CLINICAL DATA:  Shortness of breath.  Fever. EXAM: PORTABLE CHEST 1 VIEW COMPARISON:  01/12/2023 FINDINGS: Low lung volumes. Stable heart size and mediastinal contours. Improved right perihilar opacity from prior. Minimal ill-defined bibasilar atelectasis. No pulmonary edema, large pleural effusion or pneumothorax. The bones  are under mineralized. IMPRESSION: Low lung volumes with bibasilar atelectasis. Electronically Signed   By: Narda Rutherford M.D.   On: 05/20/2023 21:10    Labs: Basic Metabolic Panel: Recent Labs  Lab 05/20/23 2017 05/22/23 0518 05/23/23 0427 05/24/23 0505 05/25/23 0433  NA 142 147* 150* 154* 154*  K 4.1 3.3* 3.2* 3.8 3.7  CL 108 115* 117* 123* 121*  CO2 22 22 23 25 25   GLUCOSE 122* 86 92 102* 91  BUN 35* 35* 41* 41* 32*  CREATININE 0.70 0.87 0.70 0.62 0.51*  CALCIUM 9.1 9.1 8.9 9.0 8.7*  MG  --   --   --  2.3  --    CBC: Recent Labs  Lab  05/20/23 2017 05/22/23 0518 05/23/23 0427  WBC 16.2* 5.6 4.2  NEUTROABS 14.8*  --   --   HGB 12.7* 11.2* 11.1*  HCT 38.1* 34.4* 33.5*  MCV 101.3* 102.1* 100.3*  PLT 102* 95* 108*   Microbiology: Results for orders placed or performed during the hospital encounter of 05/21/23  Culture, blood (Routine x 2)     Status: Abnormal   Collection Time: 05/20/23  8:17 PM   Specimen: BLOOD RIGHT HAND  Result Value Ref Range Status   Specimen Description   Final    BLOOD RIGHT HAND Performed at [facility], [address] [place], [place] [address]    Special Requests   Final    BOTTLES DRAWN AEROBIC AND ANAEROBIC Blood Culture results may not be optimal due to an inadequate volume of blood received in culture bottles Performed at [facility], [address] [place], [place] [address]    Culture  Setup Time   Final    GRAM NEGATIVE RODS IN BOTH AEROBIC AND ANAEROBIC BOTTLES CRITICAL RESULT CALLED TO, READ BACK BY AND VERIFIED WITHPaschal Dopp PHARMD 1003 05/21/23 HNM Performed at [facility], [address] [place], [place] [address]    Culture KLEBSIELLA PNEUMONIAE (A)  Final   Report Status 05/24/2023 FINAL  Final   Organism ID, Bacteria KLEBSIELLA PNEUMONIAE  Final   Organism ID, Bacteria KLEBSIELLA PNEUMONIAE  Final      Susceptibility   Klebsiella pneumoniae - KIRBY BAUER*    CEFAZOLIN RESISTANT Resistant    Klebsiella pneumoniae - MIC*    AMPICILLIN >=32 RESISTANT Resistant     CEFEPIME <=0.12 SENSITIVE Sensitive     CEFTAZIDIME <=1 SENSITIVE Sensitive     CEFTRIAXONE <=0.25 SENSITIVE Sensitive     CIPROFLOXACIN <=0.25 SENSITIVE Sensitive     GENTAMICIN <=1 SENSITIVE Sensitive     IMIPENEM <=0.25 SENSITIVE Sensitive     TRIMETH/SULFA <=20 SENSITIVE Sensitive     AMPICILLIN/SULBACTAM >=32 RESISTANT Resistant     PIP/TAZO 32 INTERMEDIATE Intermediate ug/mL    * KLEBSIELLA PNEUMONIAE    KLEBSIELLA PNEUMONIAE  Blood Culture ID Panel  (Reflexed)     Status: Abnormal   Collection Time: 05/20/23  8:17 PM  Result Value Ref Range Status   Enterococcus faecalis NOT DETECTED NOT DETECTED Final   Enterococcus Faecium NOT DETECTED NOT DETECTED Final   Listeria monocytogenes NOT DETECTED NOT DETECTED Final   Staphylococcus species NOT DETECTED NOT DETECTED Final   Staphylococcus aureus (BCID) NOT DETECTED NOT DETECTED Final   Staphylococcus epidermidis NOT DETECTED NOT DETECTED Final   Staphylococcus lugdunensis NOT DETECTED NOT DETECTED Final   Streptococcus species NOT DETECTED NOT DETECTED Final   Streptococcus agalactiae NOT DETECTED NOT DETECTED Final   Streptococcus pneumoniae NOT DETECTED NOT  DETECTED Final   Streptococcus pyogenes NOT DETECTED NOT DETECTED Final   A.calcoaceticus-baumannii NOT DETECTED NOT DETECTED Final   Bacteroides fragilis NOT DETECTED NOT DETECTED Final   Enterobacterales DETECTED (A) NOT DETECTED Final    Comment: Enterobacterales represent a large order of gram negative bacteria, not a single organism. CRITICAL RESULT CALLED TO, READ BACK BY AND VERIFIED WITH: Paschal Dopp PHARMD 1003 05/21/23 HNM    Enterobacter cloacae complex NOT DETECTED NOT DETECTED Final   Escherichia coli NOT DETECTED NOT DETECTED Final   Klebsiella aerogenes NOT DETECTED NOT DETECTED Final   Klebsiella oxytoca NOT DETECTED NOT DETECTED Final   Klebsiella pneumoniae DETECTED (A) NOT DETECTED Final    Comment: CRITICAL RESULT CALLED TO, READ BACK BY AND VERIFIED WITH: Paschal Dopp PHARMD 1003 05/21/23 HNM    Proteus species NOT DETECTED NOT DETECTED Final   Salmonella species NOT DETECTED NOT DETECTED Final   Serratia marcescens NOT DETECTED NOT DETECTED Final   Haemophilus influenzae NOT DETECTED NOT DETECTED Final   Neisseria meningitidis NOT DETECTED NOT DETECTED Final   Pseudomonas aeruginosa NOT DETECTED NOT DETECTED Final   Stenotrophomonas maltophilia NOT DETECTED NOT DETECTED Final   Candida albicans NOT  DETECTED NOT DETECTED Final   Candida auris NOT DETECTED NOT DETECTED Final   Candida glabrata NOT DETECTED NOT DETECTED Final   Candida krusei NOT DETECTED NOT DETECTED Final   Candida parapsilosis NOT DETECTED NOT DETECTED Final   Candida tropicalis NOT DETECTED NOT DETECTED Final   Cryptococcus neoformans/gattii NOT DETECTED NOT DETECTED Final   CTX-M ESBL NOT DETECTED NOT DETECTED Final   Carbapenem resistance IMP NOT DETECTED NOT DETECTED Final   Carbapenem resistance KPC NOT DETECTED NOT DETECTED Final   Carbapenem resistance NDM NOT DETECTED NOT DETECTED Final   Carbapenem resist OXA 48 LIKE NOT DETECTED NOT DETECTED Final   Carbapenem resistance VIM NOT DETECTED NOT DETECTED Final    Comment: Performed at [facility], [address] [place], [place] [address]  Culture, blood (Routine x 2)     Status: Abnormal   Collection Time: 05/21/23  2:00 AM   Specimen: BLOOD RIGHT ARM  Result Value Ref Range Status   Specimen Description   Final    BLOOD RIGHT ARM Performed at [facility], [address], [place], [place] [address]    Special Requests   Final    BOTTLES DRAWN AEROBIC AND ANAEROBIC Blood Culture adequate volume Performed at [facility], [address] [place], [place] [address]    Culture  Setup Time   Final    GRAM NEGATIVE RODS IN BOTH AEROBIC AND ANAEROBIC BOTTLES CRITICAL RESULT CALLED TO, READ BACK BY AND VERIFIED WITHVirgil Benedict [facility] 9147 05/22/23 HNM Performed at [facility], [address] [place], [place] [address]    Culture (A)  Final    KLEBSIELLA PNEUMONIAE SUSCEPTIBILITIES PERFORMED ON PREVIOUS CULTURE WITHIN THE LAST 5 DAYS. Performed at [facility], [address], [place], [place] [address]    Report Status 05/24/2023 FINAL  Final  Resp panel by RT-PCR (RSV, Flu A&B, Covid) Anterior Nasal Swab     Status: None   Collection Time: 05/21/23  2:00 AM   Specimen: Anterior Nasal Swab  Result  Value Ref Range Status   SARS Coronavirus 2 by RT PCR NEGATIVE NEGATIVE Final    Comment: (NOTE) SARS-CoV-2 target nucleic acids are NOT DETECTED.  The SARS-CoV-2 RNA is generally detectable in upper respiratory specimens during the acute  phase of infection. The lowest concentration of SARS-CoV-2 viral copies this assay can detect is 138 copies/mL. A negative result does not preclude SARS-Cov-2 infection and should not be used as the sole basis for treatment or other patient management decisions. A negative result may occur with  improper specimen collection/handling, submission of specimen other than nasopharyngeal swab, presence of viral mutation(s) within the areas targeted by this assay, and inadequate number of viral copies(<138 copies/mL). A negative result must be combined with clinical observations, patient history, and epidemiological information. The expected result is Negative.  Fact Sheet for Patients:  BloggerCourse.com  Fact Sheet for Healthcare Providers:  SeriousBroker.it  This test is no t yet approved or cleared by the [place] FDA and  has been authorized for detection and/or diagnosis of SARS-CoV-2 by FDA under an Emergency Use Authorization (EUA). This EUA will remain  in effect (meaning this test can be used) for the duration of the COVID-19 declaration under Section 564(b)(1) of the Act, 21 U.S.C.section 360bbb-3(b)(1), unless the authorization is terminated  or revoked sooner.       Influenza A by PCR NEGATIVE NEGATIVE Final   Influenza B by PCR NEGATIVE NEGATIVE Final    Comment: (NOTE) The Xpert Xpress SARS-CoV-2/FLU/RSV plus assay is intended as an aid in the diagnosis of influenza from Nasopharyngeal swab specimens and should not be used as a sole basis for treatment. Nasal washings and aspirates are unacceptable for Xpert Xpress SARS-CoV-2/FLU/RSV testing.  Fact Sheet for  Patients: BloggerCourse.com  Fact Sheet for Healthcare Providers: SeriousBroker.it  This test is not yet approved or cleared by the [place] FDA and has been authorized for detection and/or diagnosis of SARS-CoV-2 by FDA under an Emergency Use Authorization (EUA). This EUA will remain in effect (meaning this test can be used) for the duration of the COVID-19 declaration under Section 564(b)(1) of the Act, 21 U.S.C. section 360bbb-3(b)(1), unless the authorization is terminated or revoked.     Resp Syncytial Virus by PCR NEGATIVE NEGATIVE Final    Comment: (NOTE) Fact Sheet for Patients: BloggerCourse.com  Fact Sheet for Healthcare Providers: SeriousBroker.it  This test is not yet approved or cleared by the [place] FDA and has been authorized for detection and/or diagnosis of SARS-CoV-2 by FDA under an Emergency Use Authorization (EUA). This EUA will remain in effect (meaning this test can be used) for the duration of the COVID-19 declaration under Section 564(b)(1) of the Act, 21 U.S.C. section 360bbb-3(b)(1), unless the authorization is terminated or revoked.  Performed at [facility], [address], [place], [place] [address]   Urine Culture     Status: Abnormal   Collection Time: 05/21/23  2:00 AM   Specimen: Urine, Random  Result Value Ref Range Status   Specimen Description   Final    URINE, RANDOM Performed at [facility], [address], [place], [place] [address]    Special Requests   Final    NONE Reflexed from [id number] Performed at [facility], [address] [place], [place] [address]    Culture (A)  Final    >=100,000 COLONIES/mL KLEBSIELLA PNEUMONIAE 40,000 COLONIES/mL ENTEROCOCCUS FAECALIS    Report Status 05/24/2023 FINAL  Final   Organism ID, Bacteria KLEBSIELLA PNEUMONIAE (A)  Final   Organism ID, Bacteria  ENTEROCOCCUS FAECALIS (A)  Final      Susceptibility   Enterococcus faecalis - MIC*    AMPICILLIN <=2 SENSITIVE Sensitive     NITROFURANTOIN 32 SENSITIVE Sensitive  VANCOMYCIN 1 SENSITIVE Sensitive     * 40,000 COLONIES/mL ENTEROCOCCUS FAECALIS   Klebsiella pneumoniae - MIC*    AMPICILLIN >=32 RESISTANT Resistant     CEFAZOLIN 16 SENSITIVE Sensitive     CEFEPIME <=0.12 SENSITIVE Sensitive     CEFTRIAXONE <=0.25 SENSITIVE Sensitive     CIPROFLOXACIN <=0.25 SENSITIVE Sensitive     GENTAMICIN <=1 SENSITIVE Sensitive     IMIPENEM <=0.25 SENSITIVE Sensitive     NITROFURANTOIN 128 RESISTANT Resistant     TRIMETH/SULFA <=20 SENSITIVE Sensitive     AMPICILLIN/SULBACTAM >=32 RESISTANT Resistant     PIP/TAZO 32 INTERMEDIATE Intermediate ug/mL    * >=100,000 COLONIES/mL KLEBSIELLA PNEUMONIAE  Culture, blood (Routine X 2) w Reflex to ID Panel     Status: None (Preliminary result)   Collection Time: 05/22/23  5:18 AM   Specimen: BLOOD LEFT HAND  Result Value Ref Range Status   Specimen Description BLOOD LEFT HAND  Final   Special Requests   Final    BOTTLES DRAWN AEROBIC AND ANAEROBIC Blood Culture adequate volume   Culture   Final    NO GROWTH 3 DAYS Performed at [facility], [address] [place], [place] [address]    Report Status PENDING  Incomplete  Culture, blood (Routine X 2) w Reflex to ID Panel     Status: None (Preliminary result)   Collection Time: 05/22/23  5:24 AM   Specimen: BLOOD  Result Value Ref Range Status   Specimen Description BLOOD LEFT WRIST  Final   Special Requests   Final    BOTTLES DRAWN AEROBIC AND ANAEROBIC Blood Culture adequate volume   Culture   Final    NO GROWTH 3 DAYS Performed at [facility], [address], [place], [place] [address]    Report Status PENDING  Incomplete    Time coordinating discharge: Over 30 minutes  Leeroy Bock, MD  [facility] 05/25/2023, 11:52 AM

## 2023-05-25 NOTE — Plan of Care (Signed)

## 2023-05-25 NOTE — Progress Notes (Signed)
OT Cancellation Note  Patient Details Name: RANCE SMITHSON MRN: [id number] DOB: 1940/07/23   Cancelled Treatment:    Reason Eval/Treat Not Completed: Other (comment). Pt is discharging back to group home with hospice. No further skilled OT needs at this time. Please re-consult if changes arise.   Tresten Pantoja L. Aubriee Szeto, OTR/L  05/25/23, 8:23 AM

## 2023-05-25 NOTE — [place] FL2 (Signed)
[place] MEDICAID FL2 LEVEL OF CARE FORM     IDENTIFICATION  Patient Name: [name] Birthdate: [birth year] Sex: male Admission Date (Current Location): 05/21/2023  [facility] and [place] Number:      Facility and Address:         Provider Number:    Attending Physician Name and Address:  Leeroy Bock, MD  Relative Name and Phone Number:       Current Level of Care:   Recommended Level of Care:  (Group home with hospice) Prior Approval Number:    Date Approved/Denied:   PASRR Number:    Discharge Plan:      Current Diagnoses: Patient Active Problem List   Diagnosis Date Noted   Sepsis due to gram-negative UTI (HCC) 05/21/2023   Acute metabolic encephalopathy 05/21/2023   Encephalopathy 05/21/2023   Thrush 04/30/2023   Sacral decubitus ulcer, stage II (HCC) 04/26/2023   Diarrhea 04/26/2023   Dysphagia 04/25/2023   Thrombocytopenia (HCC) 04/25/2023   Hypokalemia 04/25/2023   Macrocytic anemia 04/25/2023   Severe sepsis (HCC) 04/22/2023   Acute respiratory failure with hypoxia (HCC) 04/22/2023   Bacteremia due to Proteus species 04/22/2023   UTI (urinary tract infection) 04/22/2023   Hypernatremia 04/22/2023   Nephrolithiasis 02/01/2022   Pressure injury of left heel, stage 2 (HCC) 02/01/2022   Atherosclerosis of aorta (HCC) 02/01/2022   History of iron deficiency anemia 02/01/2022   Urinary incontinence 05/26/2021   Hypotension 05/20/2021   History of rectal bleeding 05/09/2020   Hematochezia    BPH (benign prostatic hyperplasia)    Hyperlipidemia    Depression with anxiety    Personal history of urinary calculi 06/21/2019   PAD (peripheral artery disease) (HCC) 07/04/2017   Aortic valve calcification 07/04/2017   Coronary artery calcification 07/04/2017   Late effect acute polio 05/17/2016   Chronic constipation 11/19/2015   Mild major depression (HCC) 11/19/2015   Bacteriuria, chronic 09/11/2015   History of prostate cancer  03/02/2015   Amyotrophia 03/02/2015   Edema leg 03/02/2015   Cerebral palsy (HCC) 03/02/2015   Dyslipidemia 03/02/2015   Dermatitis, eczematoid 03/02/2015   Divergent squint 03/02/2015   H/O acute poliomyelitis 03/02/2015   Lymphedema 03/02/2015   Intellectual disability 03/02/2015   Hypertrophy of nail 03/02/2015   Chronic venous insufficiency 03/02/2015   Avitaminosis D 03/02/2015   Adynamia 03/02/2015   Dependent on wheelchair 03/02/2015   Renal cysts, acquired, bilateral 03/02/2015   At risk for falling 03/02/2015   Cerebral vascular disease 03/02/2015   Strabismus 02/11/2015    Orientation RESPIRATION BLADDER Height & Weight            Weight: 141 lb 14.4 oz (64.4 kg) Height:  5\' 8"  (172.7 cm)  BEHAVIORAL SYMPTOMS/MOOD NEUROLOGICAL BOWEL NUTRITION STATUS           AMBULATORY STATUS COMMUNICATION OF NEEDS Skin                               Personal Care Assistance Level of Assistance              Functional Limitations Info             SPECIAL CARE FACTORS FREQUENCY                       Contractures      Additional Factors Info  Current Medications (05/25/2023):  This is the current hospital active medication list Current Facility-Administered Medications  Medication Dose Route Frequency Provider Last Rate Last Admin   acetaminophen (TYLENOL) tablet 650 mg  650 mg Oral Q6H PRN Mikey College T, MD       alfuzosin (UROXATRAL) 24 hr tablet 10 mg  10 mg Oral Daily Mikey College T, MD   10 mg at 05/25/23 1610   ALPRAZolam Prudy Feeler) tablet 0.25 mg  0.25 mg Oral Daily PRN Mikey College T, MD       atorvastatin (LIPITOR) tablet 40 mg  40 mg Oral QPM Mikey College T, MD   40 mg at 05/24/23 1719   benzonatate (TESSALON) capsule 200 mg  200 mg Oral BID PRN Mikey College T, MD       enoxaparin (LOVENOX) injection 40 mg  40 mg Subcutaneous Q24H Mikey College T, MD   40 mg at 05/24/23 1720   fluticasone (FLONASE) 50 MCG/ACT nasal spray 2 spray   2 spray Each Nare Daily PRN Emeline General, MD       leptospermum manuka honey (MEDIHONEY) paste 1 Application  1 Application Topical Daily Leeroy Bock, MD   1 Application at 05/25/23 0943   ondansetron (ZOFRAN) tablet 4 mg  4 mg Oral Q6H PRN Mikey College T, MD       Or   ondansetron [facility]) injection 4 mg  4 mg Intravenous Q6H PRN Mikey College T, MD       pantoprazole (PROTONIX) EC tablet 40 mg  40 mg Oral Daily Mikey College T, MD   40 mg at 05/25/23 9604   sertraline (ZOLOFT) tablet 100 mg  100 mg Oral Daily Mikey College T, MD   100 mg at 05/25/23 5409   sulfamethoxazole-trimethoprim (BACTRIM DS) 800-160 MG per tablet 1 tablet  1 tablet Oral Q12H Leeroy Bock, MD   1 tablet at 05/25/23 8119     Discharge Medications: Please see discharge summary for a list of discharge medications.   STOP taking these medications     fluconazole 100 MG tablet Commonly known as: DIFLUCAN           TAKE these medications     acetaminophen 325 MG tablet Commonly known as: TYLENOL TAKE 2 TABLETS (650 MG TOTAL) BY MOUTH EVERY 6 HOURS AS NEEDED FOR MILD PAIN OR HEADACHE.    alfuzosin 10 MG 24 hr tablet Commonly known as: UROXATRAL Take 1 tablet (10 mg total) by mouth daily.    ALPRAZolam 0.25 MG tablet Commonly known as: XANAX Take 1 tablet (0.25 mg total) by mouth daily as needed for up to 2 days (agitation (crisis med)).    atorvastatin 40 MG tablet Commonly known as: LIPITOR Take 1 tablet (40 mg total) by mouth every evening.    augmented betamethasone dipropionate 0.05 % cream Commonly known as: DIPROLENE-AF APPLY TOPICALLY AS DIRECTED TO RASH AS NEEDED    benzonatate 100 MG capsule Commonly known as: TESSALON Take 2 capsules (200 mg total) by mouth 2 (two) times daily as needed for cough.    Calcium + Vitamin D3 600-10 MG-MCG Tabs Generic drug: Calcium Carb-Cholecalciferol TAKE 1 TABLET BY MOUTH TWICE DAILY.    Depend Silhouette Briefs L/XL Misc 1 each by Does not  apply route 2 (two) times daily as needed.    Ensure Original Liqd DRINK 1 BOTTLE BY MOUTH 3 TIMES DAILY    fluticasone 50 MCG/ACT nasal spray Commonly known as: FLONASE Place 2 sprays into  both nostrils daily.    food thickener Powd Commonly known as: RESOURCE THICKENUP CLEAR Take 120 g by mouth as needed.    GNP Vitamin C 500 MG tablet Generic drug: ascorbic acid TAKE 1 TABLET BY MOUTH ONCE DAILY    guaiFENesin 600 MG 12 hr tablet Commonly known as: Mucinex Take 1 tablet (600 mg total) by mouth 2 (two) times daily as needed for cough.    leptospermum manuka honey Pste paste Apply 1 Application topically daily. Start taking on: May 26, 2023    nystatin cream Commonly known as: MYCOSTATIN APPLY 1 APPLICATION TOPICALLY 2 TIMES DAILY    pantoprazole 40 MG tablet Commonly known as: PROTONIX TAKE 1 TABLET BY MOUTH ONCE EVERY DAY    sertraline 100 MG tablet Commonly known as: ZOLOFT Take 1 tablet (100 mg total) by mouth daily.    sulfamethoxazole-trimethoprim 800-160 MG tablet Commonly known as: BACTRIM DS Take 1 tablet by mouth every 12 (twelve) hours for 2 days.    SYSTANE ULTRA OP Place 1 drop into both eyes 2 (two) times daily as needed (dry eyes).       Relevant Imaging Results:  Relevant Lab Results:   Additional Information    Community education officer, LCSW

## 2023-05-27 LAB — CULTURE, BLOOD (ROUTINE X 2)
Culture: NO GROWTH
Culture: NO GROWTH
Special Requests: ADEQUATE
Special Requests: ADEQUATE

## 2023-05-28 ENCOUNTER — Ambulatory Visit: Payer: 59 | Admitting: Physician Assistant

## 2023-06-06 ENCOUNTER — Other Ambulatory Visit: Payer: Self-pay | Admitting: Family Medicine

## 2023-06-19 ENCOUNTER — Other Ambulatory Visit: Payer: Self-pay | Admitting: Family Medicine

## 2023-07-04 ENCOUNTER — Other Ambulatory Visit: Payer: Self-pay | Admitting: Family Medicine

## 2023-07-13 ENCOUNTER — Ambulatory Visit (INDEPENDENT_AMBULATORY_CARE_PROVIDER_SITE_OTHER): Payer: 59 | Admitting: Family Medicine

## 2023-07-13 ENCOUNTER — Encounter: Payer: Self-pay | Admitting: Family Medicine

## 2023-07-13 VITALS — BP 112/68 | HR 59 | Resp 16 | Ht 68.0 in | Wt 155.6 lb

## 2023-07-13 DIAGNOSIS — I700 Atherosclerosis of aorta: Secondary | ICD-10-CM | POA: Diagnosis not present

## 2023-07-13 DIAGNOSIS — E440 Moderate protein-calorie malnutrition: Secondary | ICD-10-CM

## 2023-07-13 DIAGNOSIS — G808 Other cerebral palsy: Secondary | ICD-10-CM

## 2023-07-13 DIAGNOSIS — L89150 Pressure ulcer of sacral region, unstageable: Secondary | ICD-10-CM

## 2023-07-13 DIAGNOSIS — I739 Peripheral vascular disease, unspecified: Secondary | ICD-10-CM

## 2023-07-13 DIAGNOSIS — Z9981 Dependence on supplemental oxygen: Secondary | ICD-10-CM

## 2023-07-13 DIAGNOSIS — F330 Major depressive disorder, recurrent, mild: Secondary | ICD-10-CM

## 2023-07-13 MED ORDER — ATORVASTATIN CALCIUM 40 MG PO TABS
40.0000 mg | ORAL_TABLET | Freq: Every evening | ORAL | 6 refills | Status: DC
Start: 2023-07-13 — End: 2023-09-10

## 2023-07-13 MED ORDER — SERTRALINE HCL 100 MG PO TABS: 100.0000 mg | ORAL_TABLET | Freq: Every day | ORAL | 6 refills | Status: DC

## 2023-07-13 NOTE — Progress Notes (Signed)
 Name: [name]   MRN: [id number]    DOB: December 28, 1940   Date:07/13/2023       Progress Note  Subjective  Chief Complaint    Discussed the use of AI scribe software for clinical note transcription with the patient, who gave verbal consent to proceed.  History of Present Illness   [name] "[name]" is a 83 year old male who presents for follow-up after a recent hospital discharge and hospice care initiation. He is accompanied by [name], his caregiver.  He was hospitalized in January for sepsis secondary to a urinary tract infection , which led to acute metabolic encephalopathy . During his two-week hospital stay, he experienced low oxygen levels at night . He was discharged on January 31st, and hospice care. Hospice visits him two to three times a week to assist with bathing and monitoring his condition. No new medications have been started by hospice.  He has a stage two pressure ulcer on his sacrum, which is being managed by hospice. He also had a stage two ulcer on his left heel that has resolved   He has a history of malnutrition and weight loss due to poor appetite, but his appetite has improved recently, and he has gained weight. He is currently eating properly , weight is Macias since his last visit  He has a history of cerebral palsy and polio, which contribute to his current physical limitations. He is wheelchair-bound and unable to bear weight or move his legs due to significant atrophy and lack of control.  He has a history of thrombocytopenia, which normalized during his hospital stay, and atherosclerosis of the aorta and peripheral vascular disease, for which he takes atorvastatin and has no side effects  He takes sertraline for major depressive disorder, which has improved his mood and appetite, he seems to be doing well lately.  He experiences nocturnal hypoxemia and requires oxygen at night. No history of smoking or chronic bronchitis.  He has seasonal  allergies managed with Flonase, which helps with congestion. He also has a history of choking, which has improved recently.         Patient Active Problem List   Diagnosis Date Noted   Anxiety 05/25/2023   Sacral decubitus ulcer, stage II (HCC) 04/26/2023   Dysphagia 04/25/2023   Thrombocytopenia (HCC) 04/25/2023   Macrocytic anemia 04/25/2023   Bacteremia due to Proteus species 04/22/2023   Nephrolithiasis 02/01/2022   Atherosclerosis of aorta (HCC) 02/01/2022   History of iron deficiency anemia 02/01/2022   Urinary incontinence 05/26/2021   History of rectal bleeding 05/09/2020   Hematochezia    BPH (benign prostatic hyperplasia)    Hyperlipidemia    Depression with anxiety    Personal history of urinary calculi 06/21/2019   PAD (peripheral artery disease) (HCC) 07/04/2017   Aortic valve calcification 07/04/2017   Coronary artery calcification 07/04/2017   Late effect acute polio 05/17/2016   Chronic constipation 11/19/2015   Mild major depression (HCC) 11/19/2015   Bacteriuria, chronic 09/11/2015   History of prostate cancer 03/02/2015   Amyotrophia 03/02/2015   Edema leg 03/02/2015   Cerebral palsy (HCC) 03/02/2015   Dyslipidemia 03/02/2015   Dermatitis, eczematoid 03/02/2015   Divergent squint 03/02/2015   H/O acute poliomyelitis 03/02/2015   Intellectual disability 03/02/2015   Hypertrophy of nail 03/02/2015   Chronic venous insufficiency 03/02/2015   Avitaminosis D 03/02/2015   Adynamia 03/02/2015   Dependent on wheelchair 03/02/2015   Renal cysts, acquired, bilateral 03/02/2015   At  risk for falling 03/02/2015   Cerebral vascular disease 03/02/2015   Strabismus 02/11/2015    Past Surgical History:  Procedure Laterality Date   COLONOSCOPY WITH PROPOFOL N/A 05/10/2020   Procedure: COLONOSCOPY WITH PROPOFOL;  Surgeon: Toney Reil, MD;  Location: [facility] ENDOSCOPY;  Service: Gastroenterology;  Laterality: N/A;   COLONOSCOPY WITH PROPOFOL N/A 05/12/2020    Procedure: COLONOSCOPY WITH PROPOFOL;  Surgeon: Toney Reil, MD;  Location: [place] ENDOSCOPY;  Service: Gastroenterology;  Laterality: N/A;   CYSTOSCOPY W/ RETROGRADES Left 10/04/2021   Procedure: CYSTOSCOPY WITH RETROGRADE PYELOGRAM;  Surgeon: Riki Altes, MD;  Location: [facility] ORS;  Service: Urology;  Laterality: Left;   CYSTOSCOPY/URETEROSCOPY/HOLMIUM LASER/STENT PLACEMENT Right 08/14/2017   Procedure: CYSTOSCOPY/URETEROSCOPY/HOLMIUM LASER/STENT PLACEMENT;  Surgeon: Riki Altes, MD;  Location: [facility] ORS;  Service: Urology;  Laterality: Right;   CYSTOSCOPY/URETEROSCOPY/HOLMIUM LASER/STENT PLACEMENT Left 10/04/2021   Procedure: CYSTOSCOPY/URETEROSCOPY/HOLMIUM LASER/STENT PLACEMENT/ LITHOPAXY;  Surgeon: Riki Altes, MD;  Location: [facility] ORS;  Service: Urology;  Laterality: Left;   ESOPHAGOGASTRODUODENOSCOPY (EGD) WITH PROPOFOL N/A 05/12/2020   Procedure: ESOPHAGOGASTRODUODENOSCOPY (EGD) WITH PROPOFOL;  Surgeon: Toney Reil, MD;  Location: [facility] ENDOSCOPY;  Service: Gastroenterology;  Laterality: N/A;   INCISION AND DRAINAGE ABSCESS Left 07/05/2021   Procedure: INCISION AND DRAINAGE SCROTAL WALL ABSCESS;  Surgeon: Riki Altes, MD;  Location: [facility] ORS;  Service: Urology;  Laterality: Left;   leg circulation surgery Right    PROSTATE SURGERY  11/10/2008   BRACHYTHERAPY   SCROTAL EXPLORATION Left 07/05/2021   Procedure: SCROTUM EXPLORATION;  Surgeon: Riki Altes, MD;  Location: [facility] ORS;  Service: Urology;  Laterality: Left;    Family History  Problem Relation Age of Onset   Heart attack Brother    Heart disease Other     Social History   Tobacco Use   Smoking status: Never   Smokeless tobacco: Never  Substance Use Topics   Alcohol use: No    Alcohol/week: 0.0 standard drinks of alcohol     Current Outpatient Medications:    acetaminophen (TYLENOL) 325 MG tablet, TAKE 2 TABLETS BY MOUTH EVERY 4 HOURS AS NEEDED FOR PAIN OR FEVER > 99.5 DEGREES, Disp: 60  tablet, Rfl: 5   alfuzosin (UROXATRAL) 10 MG 24 hr tablet, Take 1 tablet (10 mg total) by mouth daily., Disp: 30 tablet, Rfl: 6   augmented betamethasone dipropionate (DIPROLENE-AF) 0.05 % ointment, APPLY TOPICALLY AS DIRECTED TO RASH AS NEEDED, Disp: 45 g, Rfl: 0   benzonatate (TESSALON) 100 MG capsule, Take 2 capsules (200 mg total) by mouth 2 (two) times daily as needed for cough., Disp: 20 capsule, Rfl: 0   betamethasone dipropionate (DIPROLENE) 0.05 % ointment, SMARTSIG:1 Topical Daily, Disp: , Rfl:    Calcium Carb-Cholecalciferol (CALCIUM + VITAMIN D3) 600-10 MG-MCG TABS, TAKE 1 TABLET BY MOUTH TWICE DAILY., Disp: 60 tablet, Rfl: 5   fluticasone (FLONASE) 50 MCG/ACT nasal spray, Place 2 sprays into both nostrils daily., Disp: 16 g, Rfl: 0   food thickener (RESOURCE THICKENUP CLEAR) POWD, Take 120 g by mouth as needed., Disp: 100 Can, Rfl: 0   GNP VITAMIN C 500 MG tablet, TAKE 1 TABLET BY MOUTH ONCE DAILY, Disp: 100 tablet, Rfl: 1   guaiFENesin (MUCINEX) 600 MG 12 hr tablet, Take 1 tablet (600 mg total) by mouth 2 (two) times daily as needed for cough., Disp: 60 tablet, Rfl: 0   Incontinence Supply Disposable (DEPEND SILHOUETTE BRIEFS L/XL) MISC, 1 each by Does not apply route 2 (two) times daily as  needed., Disp: 60 each, Rfl: 5   leptospermum manuka honey (MEDIHONEY) PSTE paste, Apply 1 Application topically daily., Disp: , Rfl:    Nutritional Supplements (ENSURE ORIGINAL) LIQD, DRINK 1 BOTTLE BY MOUTH 3 TIMES DAILY, Disp: 237 mL, Rfl: 5   nystatin cream (MYCOSTATIN), APPLY 1 APPLICATION TOPICALLY 2 TIMES DAILY, Disp: 30 g, Rfl: 0   pantoprazole (PROTONIX) 40 MG tablet, TAKE 1 TABLET BY MOUTH ONCE EVERY DAY, Disp: 30 tablet, Rfl: 0   Polyethyl Glycol-Propyl Glycol (SYSTANE ULTRA OP), Place 1 drop into both eyes 2 (two) times daily as needed (dry eyes)., Disp: , Rfl:    atorvastatin (LIPITOR) 40 MG tablet, Take 1 tablet (40 mg total) by mouth every evening., Disp: 30 tablet, Rfl: 6    sertraline (ZOLOFT) 100 MG tablet, Take 1 tablet (100 mg total) by mouth daily., Disp: 30 tablet, Rfl: 6  No Known Allergies  I personally reviewed active problem list, medication list, allergies, family history with the patient/caregiver today.   ROS  Ten systems reviewed and is negative except as mentioned in HPI    Objective  Vitals:   07/13/23 1005  BP: 112/68  Pulse: (!) 59  Resp: 16  SpO2: 96%  Weight: 155 lb 9.6 oz (70.6 kg)  Height: 5\' 8"  (1.727 m)    Body mass index is 23.66 kg/m.  Physical Exam  Constitutional: Patient appears well-developed and malnourished   No distress.  HEENT: head atraumatic, normocephalic, pupils equal and reactive to light, neck supple Cardiovascular: Normal rate, regular rhythm and normal heart sounds.  No murmur heard. No BLE edema. Pulmonary/Chest: Effort normal and breath sounds normal. No respiratory distress. Abdominal: Soft.  There is no tenderness. Muscular skeletal: atrophy hands and legs  Psychiatric: quiet, cooperative    Diabetic Foot Exam:     PHQ2/9:    07/13/2023   10:04 AM 03/15/2023   10:23 AM 09/29/2022   10:30 AM 08/17/2022    9:11 AM 07/10/2022   10:50 AM  Depression screen PHQ 2/9  Decreased Interest 0 0 0 0 0  Down, Depressed, Hopeless 0 0  0 0  PHQ - 2 Score 0 0 0 0 0  Altered sleeping 0      Tired, decreased energy 0      Change in appetite 0      Feeling bad or failure about yourself  0      Trouble concentrating 0      Moving slowly or fidgety/restless 0      Suicidal thoughts 0      PHQ-9 Score 0      Difficult doing work/chores Not difficult at all        phq 9 is negative  Fall Risk:    03/15/2023   10:23 AM 12/13/2022   10:05 AM 09/29/2022   10:29 AM 09/11/2022    1:39 PM 08/17/2022    9:10 AM  Fall Risk   Falls in the past year? 0 0 0 0 0  Number falls in past yr: 0  0 0 0  Injury with Fall? 0  0 0 0  Risk for fall due to : Impaired balance/gait;Impaired mobility;Impaired vision  Impaired mobility No Fall Risks    Follow up  Falls prevention discussed;Education provided;Falls evaluation completed Education provided;Falls prevention discussed       Assessment and Plan    History of Sepsis Hospitalized for sepsis due to UTI and blood infection, resulting in acute metabolic encephalopathy. Hospice care initiated post-hospitalization for  low oxygen levels. - Continue hospice care for monitoring and support.  Pressure ulcer of the sacrum unspecified stage  on the sacrum managed by hospice. - Continue hospice management of the pressure ulcer.  Nocturnal hypoxemia Experiences nocturnal hypoxemia requiring oxygen at night. No sleep study conducted. - Continue nocturnal oxygen therapy.  Malnutrition Malnutrition due to poor appetite and weight loss. Recent improvement in appetite and weight gain. - Monitor nutritional status and weight.  Cerebral palsy Cerebral palsy causes functional limitations and necessitates group home care.  Peripheral vascular disease and atherosclerosis Peripheral vascular disease and atherosclerosis of the aorta. On atorvastatin with no side effects. - Continue atorvastatin for peripheral vascular disease and atherosclerosis.  Major depressive disorder mild recurrent Major depressive disorder managed with sertraline. Mood Macias and appetite improved. - Continue sertraline for major depressive disorder.

## 2023-07-19 ENCOUNTER — Other Ambulatory Visit: Payer: Self-pay | Admitting: Family Medicine

## 2023-07-19 DIAGNOSIS — R350 Frequency of micturition: Secondary | ICD-10-CM

## 2023-07-23 ENCOUNTER — Other Ambulatory Visit: Payer: Self-pay | Admitting: Family Medicine

## 2023-07-23 DIAGNOSIS — E441 Mild protein-calorie malnutrition: Secondary | ICD-10-CM

## 2023-09-05 ENCOUNTER — Telehealth: Payer: Self-pay

## 2023-09-05 NOTE — Telephone Encounter (Signed)
 call

## 2023-09-06 NOTE — Telephone Encounter (Signed)
 Called [name] they are sending over death certificate to file in chart and confirm death.

## 2023-09-23 DEATH — deceased

## 2023-10-05 ENCOUNTER — Ambulatory Visit

## 2024-01-14 ENCOUNTER — Ambulatory Visit: Admitting: Family Medicine
